# Patient Record
Sex: Male | Born: 1965 | Race: Black or African American | Hispanic: No | Marital: Single | Smoking: Never smoker
Health system: Southern US, Community
[De-identification: ages and names within clinical notes are randomized; demographics above are authoritative.]

## PROBLEM LIST (undated history)

## (undated) DIAGNOSIS — M109 Gout, unspecified: Secondary | ICD-10-CM

## (undated) DIAGNOSIS — I255 Ischemic cardiomyopathy: Secondary | ICD-10-CM

## (undated) DIAGNOSIS — I219 Acute myocardial infarction, unspecified: Secondary | ICD-10-CM

## (undated) DIAGNOSIS — N183 Chronic kidney disease, stage 3 unspecified: Secondary | ICD-10-CM

## (undated) DIAGNOSIS — Z992 Dependence on renal dialysis: Secondary | ICD-10-CM

## (undated) DIAGNOSIS — I96 Gangrene, not elsewhere classified: Secondary | ICD-10-CM

## (undated) DIAGNOSIS — I739 Peripheral vascular disease, unspecified: Secondary | ICD-10-CM

## (undated) DIAGNOSIS — E119 Type 2 diabetes mellitus without complications: Secondary | ICD-10-CM

## (undated) DIAGNOSIS — I5020 Unspecified systolic (congestive) heart failure: Secondary | ICD-10-CM

## (undated) DIAGNOSIS — I509 Heart failure, unspecified: Secondary | ICD-10-CM

## (undated) DIAGNOSIS — G4730 Sleep apnea, unspecified: Secondary | ICD-10-CM

## (undated) DIAGNOSIS — M51379 Other intervertebral disc degeneration, lumbosacral region without mention of lumbar back pain or lower extremity pain: Secondary | ICD-10-CM

## (undated) DIAGNOSIS — I2510 Atherosclerotic heart disease of native coronary artery without angina pectoris: Secondary | ICD-10-CM

## (undated) DIAGNOSIS — Z9710 Presence of artificial limb (complete) (partial), unspecified: Secondary | ICD-10-CM

## (undated) DIAGNOSIS — M5137 Other intervertebral disc degeneration, lumbosacral region: Secondary | ICD-10-CM

## (undated) DIAGNOSIS — I10 Essential (primary) hypertension: Secondary | ICD-10-CM

## (undated) DIAGNOSIS — I639 Cerebral infarction, unspecified: Secondary | ICD-10-CM

## (undated) DIAGNOSIS — D649 Anemia, unspecified: Secondary | ICD-10-CM

## (undated) DIAGNOSIS — I779 Disorder of arteries and arterioles, unspecified: Secondary | ICD-10-CM

## (undated) DIAGNOSIS — Z95810 Presence of automatic (implantable) cardiac defibrillator: Secondary | ICD-10-CM

## (undated) HISTORY — DX: Peripheral vascular disease, unspecified: I73.9

## (undated) HISTORY — DX: Chronic kidney disease, stage 3 (moderate): N18.3

## (undated) HISTORY — DX: Ischemic cardiomyopathy: I25.5

## (undated) HISTORY — DX: Unspecified systolic (congestive) heart failure: I50.20

## (undated) HISTORY — DX: Disorder of arteries and arterioles, unspecified: I77.9

## (undated) HISTORY — PX: CAROTID ENDARTERECTOMY: SUR193

## (undated) HISTORY — DX: Chronic kidney disease, stage 3 unspecified: N18.30

---

## 1992-05-04 HISTORY — PX: HIP SURGERY: SHX245

## 2007-05-05 DIAGNOSIS — I219 Acute myocardial infarction, unspecified: Secondary | ICD-10-CM

## 2007-05-05 DIAGNOSIS — I639 Cerebral infarction, unspecified: Secondary | ICD-10-CM

## 2007-05-05 HISTORY — DX: Acute myocardial infarction, unspecified: I21.9

## 2007-05-05 HISTORY — DX: Cerebral infarction, unspecified: I63.9

## 2008-05-04 HISTORY — PX: OTHER SURGICAL HISTORY: SHX169

## 2008-05-04 HISTORY — PX: CHOLECYSTECTOMY: SHX55

## 2008-05-04 HISTORY — PX: CORONARY ARTERY BYPASS GRAFT: SHX141

## 2008-07-19 ENCOUNTER — Ambulatory Visit: Payer: Self-pay | Admitting: Cardiothoracic Surgery

## 2008-07-26 ENCOUNTER — Ambulatory Visit: Payer: Self-pay | Admitting: Cardiothoracic Surgery

## 2009-05-04 DIAGNOSIS — Z95810 Presence of automatic (implantable) cardiac defibrillator: Secondary | ICD-10-CM

## 2009-05-04 DIAGNOSIS — I472 Ventricular tachycardia, unspecified: Secondary | ICD-10-CM

## 2009-05-04 HISTORY — PX: CARDIAC DEFIBRILLATOR PLACEMENT: SHX171

## 2009-05-04 HISTORY — DX: Presence of automatic (implantable) cardiac defibrillator: Z95.810
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
## 2014-05-09 DIAGNOSIS — M79671 Pain in right foot: Secondary | ICD-10-CM | POA: Diagnosis not present

## 2014-05-09 DIAGNOSIS — L89893 Pressure ulcer of other site, stage 3: Secondary | ICD-10-CM | POA: Diagnosis not present

## 2014-05-09 DIAGNOSIS — M79672 Pain in left foot: Secondary | ICD-10-CM | POA: Diagnosis not present

## 2014-05-09 DIAGNOSIS — B351 Tinea unguium: Secondary | ICD-10-CM | POA: Diagnosis not present

## 2014-05-09 DIAGNOSIS — E11621 Type 2 diabetes mellitus with foot ulcer: Secondary | ICD-10-CM | POA: Diagnosis not present

## 2014-05-12 DIAGNOSIS — Z79899 Other long term (current) drug therapy: Secondary | ICD-10-CM | POA: Diagnosis not present

## 2014-05-12 DIAGNOSIS — M5137 Other intervertebral disc degeneration, lumbosacral region: Secondary | ICD-10-CM | POA: Diagnosis not present

## 2014-05-12 DIAGNOSIS — M545 Low back pain: Secondary | ICD-10-CM | POA: Diagnosis not present

## 2014-05-12 DIAGNOSIS — M5136 Other intervertebral disc degeneration, lumbar region: Secondary | ICD-10-CM | POA: Diagnosis not present

## 2014-05-12 DIAGNOSIS — M47817 Spondylosis without myelopathy or radiculopathy, lumbosacral region: Secondary | ICD-10-CM | POA: Diagnosis not present

## 2014-05-12 DIAGNOSIS — Z794 Long term (current) use of insulin: Secondary | ICD-10-CM | POA: Diagnosis not present

## 2014-05-12 DIAGNOSIS — Z886 Allergy status to analgesic agent status: Secondary | ICD-10-CM | POA: Diagnosis not present

## 2014-05-12 DIAGNOSIS — E119 Type 2 diabetes mellitus without complications: Secondary | ICD-10-CM | POA: Diagnosis not present

## 2014-05-16 DIAGNOSIS — L89893 Pressure ulcer of other site, stage 3: Secondary | ICD-10-CM | POA: Diagnosis not present

## 2014-05-16 DIAGNOSIS — E11621 Type 2 diabetes mellitus with foot ulcer: Secondary | ICD-10-CM | POA: Diagnosis not present

## 2014-05-16 DIAGNOSIS — Z09 Encounter for follow-up examination after completed treatment for conditions other than malignant neoplasm: Secondary | ICD-10-CM | POA: Diagnosis not present

## 2014-05-18 DIAGNOSIS — M5416 Radiculopathy, lumbar region: Secondary | ICD-10-CM | POA: Diagnosis not present

## 2014-05-30 DIAGNOSIS — E11621 Type 2 diabetes mellitus with foot ulcer: Secondary | ICD-10-CM | POA: Diagnosis not present

## 2014-05-30 DIAGNOSIS — E119 Type 2 diabetes mellitus without complications: Secondary | ICD-10-CM | POA: Diagnosis not present

## 2014-05-30 DIAGNOSIS — L89893 Pressure ulcer of other site, stage 3: Secondary | ICD-10-CM | POA: Diagnosis not present

## 2014-05-31 ENCOUNTER — Other Ambulatory Visit (HOSPITAL_COMMUNITY): Payer: Self-pay | Admitting: Orthopedic Surgery

## 2014-05-31 DIAGNOSIS — M1651 Unilateral post-traumatic osteoarthritis, right hip: Secondary | ICD-10-CM | POA: Diagnosis not present

## 2014-05-31 DIAGNOSIS — M5440 Lumbago with sciatica, unspecified side: Secondary | ICD-10-CM

## 2014-05-31 DIAGNOSIS — M6149 Other calcification of muscle, multiple sites: Secondary | ICD-10-CM | POA: Diagnosis not present

## 2014-06-04 ENCOUNTER — Other Ambulatory Visit: Payer: Self-pay | Admitting: Orthopedic Surgery

## 2014-06-04 DIAGNOSIS — M25551 Pain in right hip: Secondary | ICD-10-CM

## 2014-06-07 ENCOUNTER — Ambulatory Visit
Admission: RE | Admit: 2014-06-07 | Discharge: 2014-06-07 | Disposition: A | Discharge status: Home / Self Care | Payer: Medicare Other | Source: Ambulatory Visit | Attending: Orthopedic Surgery | Admitting: Orthopedic Surgery

## 2014-06-07 DIAGNOSIS — R937 Abnormal findings on diagnostic imaging of other parts of musculoskeletal system: Secondary | ICD-10-CM | POA: Diagnosis not present

## 2014-06-07 DIAGNOSIS — M25551 Pain in right hip: Secondary | ICD-10-CM | POA: Diagnosis not present

## 2014-06-07 DIAGNOSIS — M25851 Other specified joint disorders, right hip: Secondary | ICD-10-CM | POA: Diagnosis not present

## 2014-06-13 DIAGNOSIS — E11621 Type 2 diabetes mellitus with foot ulcer: Secondary | ICD-10-CM | POA: Diagnosis not present

## 2014-06-13 DIAGNOSIS — L89893 Pressure ulcer of other site, stage 3: Secondary | ICD-10-CM | POA: Diagnosis not present

## 2014-06-14 DIAGNOSIS — M1651 Unilateral post-traumatic osteoarthritis, right hip: Secondary | ICD-10-CM | POA: Diagnosis not present

## 2014-06-14 DIAGNOSIS — M6149 Other calcification of muscle, multiple sites: Secondary | ICD-10-CM | POA: Diagnosis not present

## 2014-06-15 ENCOUNTER — Other Ambulatory Visit: Payer: Self-pay | Admitting: Orthopedic Surgery

## 2014-06-21 DIAGNOSIS — M25551 Pain in right hip: Secondary | ICD-10-CM | POA: Diagnosis not present

## 2014-06-21 DIAGNOSIS — M25651 Stiffness of right hip, not elsewhere classified: Secondary | ICD-10-CM | POA: Diagnosis not present

## 2014-06-21 DIAGNOSIS — M6281 Muscle weakness (generalized): Secondary | ICD-10-CM | POA: Diagnosis not present

## 2014-07-02 ENCOUNTER — Encounter: Payer: Self-pay | Admitting: Radiation Oncology

## 2014-07-02 NOTE — Progress Notes (Signed)
History: Calcification, Right Hip S/P Acetabular Fracture  Location(s) of Symptomatic pain: Right Hip   06/07/14 CT of Right Hip: "Shows extensive abnormal soft tissue calcification around the right hip which should severly restrict most motion of the hip with the exception of the internal rotation. Complete healing of the acetabular fracture.  The femoral head appears normal."   Anticipated Treatment: Surgery to remove the Heterotopic Bone  Patient's main complaints related to symptomatic tumor(s) are: Pain in the right hip. Denies any numbness or tingling down his leg or any groin pain.  No loss of bowel or bladder functions  Pain on a scale of 0-10 is:     Ambulatory status? Walker? Wheelchair?: Ambulatory  SAFETY ISSUES:  Prior radiation? No  Pacemaker/ICD? No  Possible current pregnancy? N/A  Is the patient on methotrexate? No  Additional Complaints / other details:Surgery=Excision Heterotopic Bone   Right hip scheduled 07/18/14 with Dr. Pilar Plate Rowan,MD

## 2014-07-03 ENCOUNTER — Ambulatory Visit: Payer: Medicare Other

## 2014-07-03 ENCOUNTER — Telehealth: Payer: Self-pay | Admitting: *Deleted

## 2014-07-03 ENCOUNTER — Encounter: Payer: Self-pay | Admitting: Radiation Oncology

## 2014-07-03 ENCOUNTER — Ambulatory Visit
Admission: RE | Admit: 2014-07-03 | Discharge: 2014-07-03 | Disposition: A | Discharge status: Home / Self Care | Payer: Medicare Other | Source: Ambulatory Visit | Attending: Radiation Oncology | Admitting: Radiation Oncology

## 2014-07-03 DIAGNOSIS — Z95810 Presence of automatic (implantable) cardiac defibrillator: Secondary | ICD-10-CM | POA: Insufficient documentation

## 2014-07-03 DIAGNOSIS — S32491S Other specified fracture of right acetabulum, sequela: Secondary | ICD-10-CM | POA: Insufficient documentation

## 2014-07-03 DIAGNOSIS — Z9049 Acquired absence of other specified parts of digestive tract: Secondary | ICD-10-CM | POA: Insufficient documentation

## 2014-07-03 DIAGNOSIS — E119 Type 2 diabetes mellitus without complications: Secondary | ICD-10-CM | POA: Insufficient documentation

## 2014-07-03 DIAGNOSIS — M898X9 Other specified disorders of bone, unspecified site: Secondary | ICD-10-CM

## 2014-07-03 DIAGNOSIS — M948X9 Other specified disorders of cartilage, unspecified sites: Secondary | ICD-10-CM | POA: Insufficient documentation

## 2014-07-03 DIAGNOSIS — Z794 Long term (current) use of insulin: Secondary | ICD-10-CM | POA: Insufficient documentation

## 2014-07-03 DIAGNOSIS — I509 Heart failure, unspecified: Secondary | ICD-10-CM | POA: Insufficient documentation

## 2014-07-03 DIAGNOSIS — I10 Essential (primary) hypertension: Secondary | ICD-10-CM | POA: Insufficient documentation

## 2014-07-03 HISTORY — DX: Essential (primary) hypertension: I10

## 2014-07-03 HISTORY — DX: Heart failure, unspecified: I50.9

## 2014-07-03 HISTORY — DX: Other intervertebral disc degeneration, lumbosacral region without mention of lumbar back pain or lower extremity pain: M51.379

## 2014-07-03 HISTORY — DX: Other intervertebral disc degeneration, lumbosacral region: M51.37

## 2014-07-03 HISTORY — DX: Type 2 diabetes mellitus without complications: E11.9

## 2014-07-03 NOTE — Telephone Encounter (Signed)
Called [facility] Glad Dilling[facility], per Dr. Orvilla Cornwall. Claude needs to be sen next week prior to his surgery on 07/18/14,please call and schedule this now,Karen gave verbal understanding 2:33 PM

## 2014-07-03 NOTE — Telephone Encounter (Signed)
Called patient home,spoke with [name], asked if he was going to make his appt today with Dr.Murray?, 'No, I have so many Dr appts, I forgot this one",asked if he wanted me to transfer him to our scheduler to reschedule with Dr.Murray?, 'Yes, thank you, I'm sorry", transferred do Rhonda Flynt 1:55 PM

## 2014-07-04 DIAGNOSIS — I10 Essential (primary) hypertension: Secondary | ICD-10-CM | POA: Diagnosis not present

## 2014-07-04 DIAGNOSIS — I5020 Unspecified systolic (congestive) heart failure: Secondary | ICD-10-CM | POA: Diagnosis not present

## 2014-07-04 DIAGNOSIS — Z4789 Encounter for other orthopedic aftercare: Secondary | ICD-10-CM | POA: Diagnosis not present

## 2014-07-04 DIAGNOSIS — Q6589 Other specified congenital deformities of hip: Secondary | ICD-10-CM | POA: Diagnosis not present

## 2014-07-05 NOTE — Pre-Procedure Instructions (Signed)
[name].  07/05/2014   Your procedure is scheduled on:  Wed, Mar 16 @ 12:45 PM  Report to [name] Entrance A  at 10:45 AM.  Call this number if you have problems the morning of surgery: [phone]   Remember:   Do not eat food or drink liquids after midnight.   Take these medicines the morning of surgery with A SIP OF WATER: Carvedilol(Coreg)              No Goody's,BC's,Aspirin,Ibuprofen,Aleve,Fish Oil,or any Herbal Medications.   Do not wear jewelry.  Do not wear lotions, powders, or colognes. You may wear deodorant.  Men may shave face and neck.  Do not bring valuables to the [name].  [name] is not responsible                  for any belongings or valuables.               Contacts, dentures or bridgework may not be worn into surgery.  Leave suitcase in the car. After surgery it may be brought to your room.  For patients admitted to the [name], discharge time is determined by your                treatment team.                   Special Instructions:  [name] - Preparing for Surgery  Before surgery, you can play an important role.  Because skin is not sterile, your skin needs to be as free of germs as possible.  You can reduce the number of germs on you skin by washing with CHG (chlorahexidine gluconate) soap before surgery.  CHG is an antiseptic cleaner which kills germs and bonds with the skin to continue killing germs even after washing.  Please DO NOT use if you have an allergy to CHG or antibacterial soaps.  If your skin becomes reddened/irritated stop using the CHG and inform your nurse when you arrive at Short Stay.  Do not shave (including legs and underarms) for at least 48 hours prior to the first CHG shower.  You may shave your face.  Please follow these instructions carefully:   1.  Shower with CHG Soap the night before surgery and the                                morning of Surgery.  2.  If you choose to wash your hair, wash your hair  first as usual with your       normal shampoo.  3.  After you shampoo, rinse your hair and body thoroughly to remove the                      Shampoo.  4.  Use CHG as you would any other liquid soap.  You can apply chg directly       to the skin and wash gently with scrungie or a clean washcloth.  5.  Apply the CHG Soap to your body ONLY FROM THE NECK DOWN.        Do not use on open wounds or open sores.  Avoid contact with your eyes,       ears, mouth and genitals (private parts).  Wash genitals (private parts)       with your normal soap.  6.  Wash thoroughly, paying  special attention to the area where your surgery        will be performed.  7.  Thoroughly rinse your body with warm water from the neck down.  8.  DO NOT shower/wash with your normal soap after using and rinsing off       the CHG Soap.  9.  Pat yourself dry with a clean towel.            10.  Wear clean pajamas.            11.  Place clean sheets on your bed the night of your first shower and do not        sleep with pets.  Day of Surgery  Do not apply any lotions/deoderants the morning of surgery.  Please wear clean clothes to the [name]/surgery center.     Please read over the following fact sheets that you were given: Pain Booklet, Coughing and Deep Breathing, Blood Transfusion Information, MRSA Information and Surgical Site Infection Prevention

## 2014-07-06 ENCOUNTER — Encounter (HOSPITAL_COMMUNITY): Payer: Self-pay

## 2014-07-06 ENCOUNTER — Inpatient Hospital Stay (HOSPITAL_COMMUNITY)
Admission: RE | Admit: 2014-07-06 | Discharge: 2014-07-06 | Disposition: A | Discharge status: Home / Self Care | Payer: Medicare Other | Source: Ambulatory Visit

## 2014-07-10 ENCOUNTER — Telehealth: Payer: Self-pay | Admitting: *Deleted

## 2014-07-10 ENCOUNTER — Ambulatory Visit: Admission: RE | Admit: 2014-07-10 | Payer: Medicare Other | Source: Ambulatory Visit | Admitting: Radiation Oncology

## 2014-07-10 ENCOUNTER — Ambulatory Visit: Admission: RE | Admit: 2014-07-10 | Payer: Medicare Other | Source: Ambulatory Visit

## 2014-07-10 NOTE — Telephone Encounter (Signed)
Called patient at home, woke him up, patient stated he didn't know of this appt today, and he has 3 Dr. appt tomorrow, asked if he could come in by 430pm today,"No", informed him that MD has to see him  beofre his syrgery, he stated he coukld come in either tomorrow or Thursday his days off, transferred call to Santiago Glad Dillingham to schedule appt , appt made Thursday at [facility] per karen Dillingham, will inform MD 3:17 PM

## 2014-07-11 DIAGNOSIS — L89893 Pressure ulcer of other site, stage 3: Secondary | ICD-10-CM | POA: Diagnosis not present

## 2014-07-11 DIAGNOSIS — E11621 Type 2 diabetes mellitus with foot ulcer: Secondary | ICD-10-CM | POA: Diagnosis not present

## 2014-07-12 ENCOUNTER — Ambulatory Visit
Admission: RE | Admit: 2014-07-12 | Discharge: 2014-07-12 | Disposition: A | Discharge status: Home / Self Care | Payer: Medicare Other | Source: Ambulatory Visit | Attending: Radiation Oncology | Admitting: Radiation Oncology

## 2014-07-12 ENCOUNTER — Encounter: Payer: Self-pay | Admitting: Radiation Oncology

## 2014-07-12 ENCOUNTER — Other Ambulatory Visit: Payer: Self-pay | Admitting: Radiation Oncology

## 2014-07-12 ENCOUNTER — Encounter (HOSPITAL_COMMUNITY)
Admission: RE | Admit: 2014-07-12 | Discharge: 2014-07-12 | Disposition: A | Discharge status: Home / Self Care | Payer: Medicare Other | Source: Ambulatory Visit | Attending: Orthopedic Surgery | Admitting: Orthopedic Surgery

## 2014-07-12 VITALS — BP 146/86 | HR 101 | Temp 98.0°F | Ht 70.0 in | Wt 268.9 lb

## 2014-07-12 DIAGNOSIS — E119 Type 2 diabetes mellitus without complications: Secondary | ICD-10-CM | POA: Diagnosis not present

## 2014-07-12 DIAGNOSIS — M948X9 Other specified disorders of cartilage, unspecified sites: Secondary | ICD-10-CM | POA: Diagnosis not present

## 2014-07-12 DIAGNOSIS — Z95810 Presence of automatic (implantable) cardiac defibrillator: Secondary | ICD-10-CM | POA: Diagnosis not present

## 2014-07-12 DIAGNOSIS — I509 Heart failure, unspecified: Secondary | ICD-10-CM | POA: Diagnosis not present

## 2014-07-12 DIAGNOSIS — I10 Essential (primary) hypertension: Secondary | ICD-10-CM | POA: Diagnosis not present

## 2014-07-12 DIAGNOSIS — M898X9 Other specified disorders of bone, unspecified site: Secondary | ICD-10-CM

## 2014-07-12 DIAGNOSIS — Z794 Long term (current) use of insulin: Secondary | ICD-10-CM | POA: Diagnosis not present

## 2014-07-12 DIAGNOSIS — S32491S Other specified fracture of right acetabulum, sequela: Secondary | ICD-10-CM | POA: Diagnosis not present

## 2014-07-12 DIAGNOSIS — Z9049 Acquired absence of other specified parts of digestive tract: Secondary | ICD-10-CM | POA: Diagnosis not present

## 2014-07-12 HISTORY — DX: Presence of automatic (implantable) cardiac defibrillator: Z95.810

## 2014-07-12 NOTE — Progress Notes (Signed)
Please see consultation note under CT simulation encounter from earlier today.
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
## 2014-07-12 NOTE — Addendum Note (Signed)
Encounter addended by: Benn Moulder, RN on: 07/12/2014  5:59 PM<BR>     Documentation filed: Charges VN

## 2014-07-12 NOTE — Progress Notes (Addendum)
Mr. Sylte here for assessment for Heterotopic bone formation in his right hip.  He denies any pain today, but reports that when he ambulates pain is worsens.  He has a defibrillator inserted by [facility], Dr. Eual Fines.

## 2014-07-13 ENCOUNTER — Ambulatory Visit
Admission: RE | Admit: 2014-07-13 | Discharge: 2014-07-13 | Disposition: A | Discharge status: Home / Self Care | Payer: Medicare Other | Source: Ambulatory Visit | Attending: Radiation Oncology | Admitting: Radiation Oncology

## 2014-07-13 DIAGNOSIS — I509 Heart failure, unspecified: Secondary | ICD-10-CM | POA: Diagnosis not present

## 2014-07-13 DIAGNOSIS — M898X9 Other specified disorders of bone, unspecified site: Secondary | ICD-10-CM

## 2014-07-13 DIAGNOSIS — M948X9 Other specified disorders of cartilage, unspecified sites: Secondary | ICD-10-CM | POA: Diagnosis not present

## 2014-07-13 DIAGNOSIS — I10 Essential (primary) hypertension: Secondary | ICD-10-CM | POA: Diagnosis not present

## 2014-07-13 DIAGNOSIS — E119 Type 2 diabetes mellitus without complications: Secondary | ICD-10-CM | POA: Diagnosis not present

## 2014-07-13 DIAGNOSIS — S32491S Other specified fracture of right acetabulum, sequela: Secondary | ICD-10-CM | POA: Diagnosis not present

## 2014-07-13 DIAGNOSIS — Z794 Long term (current) use of insulin: Secondary | ICD-10-CM | POA: Diagnosis not present

## 2014-07-14 NOTE — Progress Notes (Signed)
Complex simulation/treatment planning note: The patient was taken to the simulator on 07/13/2014.  A Vac lock immobilization device was constructed.  His right pelvis/hip was scanned.  The CT data set was sent to the planning system where I contoured his right hip calcifications.  He was set up RAO and LPO with 2 separate and unique multileaf collimators (total of 3 complex treatment devices).  I am prescribing 7000 cGy in one session.

## 2014-07-16 ENCOUNTER — Encounter (HOSPITAL_COMMUNITY): Payer: Self-pay

## 2014-07-16 ENCOUNTER — Encounter (HOSPITAL_COMMUNITY)
Admission: RE | Admit: 2014-07-16 | Discharge: 2014-07-16 | Disposition: A | Discharge status: Home / Self Care | Payer: Medicare Other | Source: Ambulatory Visit | Attending: Orthopedic Surgery | Admitting: Orthopedic Surgery

## 2014-07-16 ENCOUNTER — Ambulatory Visit (HOSPITAL_COMMUNITY)
Admission: RE | Admit: 2014-07-16 | Discharge: 2014-07-16 | Disposition: A | Discharge status: Home / Self Care | Payer: Medicare Other | Source: Ambulatory Visit | Attending: Orthopedic Surgery | Admitting: Orthopedic Surgery

## 2014-07-16 DIAGNOSIS — I10 Essential (primary) hypertension: Secondary | ICD-10-CM

## 2014-07-16 DIAGNOSIS — Z01818 Encounter for other preprocedural examination: Secondary | ICD-10-CM | POA: Insufficient documentation

## 2014-07-16 DIAGNOSIS — Z794 Long term (current) use of insulin: Secondary | ICD-10-CM | POA: Insufficient documentation

## 2014-07-16 DIAGNOSIS — Z01810 Encounter for preprocedural cardiovascular examination: Secondary | ICD-10-CM

## 2014-07-16 DIAGNOSIS — M948X9 Other specified disorders of cartilage, unspecified sites: Secondary | ICD-10-CM | POA: Insufficient documentation

## 2014-07-16 DIAGNOSIS — Z01812 Encounter for preprocedural laboratory examination: Secondary | ICD-10-CM | POA: Insufficient documentation

## 2014-07-16 DIAGNOSIS — D62 Acute posthemorrhagic anemia: Secondary | ICD-10-CM | POA: Diagnosis not present

## 2014-07-16 DIAGNOSIS — E119 Type 2 diabetes mellitus without complications: Secondary | ICD-10-CM

## 2014-07-16 DIAGNOSIS — I509 Heart failure, unspecified: Secondary | ICD-10-CM

## 2014-07-16 DIAGNOSIS — Z95810 Presence of automatic (implantable) cardiac defibrillator: Secondary | ICD-10-CM

## 2014-07-16 DIAGNOSIS — M5137 Other intervertebral disc degeneration, lumbosacral region: Secondary | ICD-10-CM | POA: Diagnosis not present

## 2014-07-16 DIAGNOSIS — M25851 Other specified joint disorders, right hip: Secondary | ICD-10-CM

## 2014-07-16 DIAGNOSIS — M898X8 Other specified disorders of bone, other site: Secondary | ICD-10-CM | POA: Diagnosis not present

## 2014-07-16 HISTORY — DX: Cerebral infarction, unspecified: I63.9

## 2014-07-16 HISTORY — DX: Atherosclerotic heart disease of native coronary artery without angina pectoris: I25.10

## 2014-07-16 HISTORY — DX: Sleep apnea, unspecified: G47.30

## 2014-07-16 LAB — CBC WITH DIFFERENTIAL/PLATELET
Basophils Absolute: 0 10*3/uL (ref 0.0–0.1)
Basophils Relative: 0 % (ref 0–1)
Eosinophils Absolute: 0.1 10*3/uL (ref 0.0–0.7)
Eosinophils Relative: 1 % (ref 0–5)
HEMATOCRIT: 36.5 % — AB (ref 39.0–52.0)
HEMOGLOBIN: 11.8 g/dL — AB (ref 13.0–17.0)
LYMPHS PCT: 20 % (ref 12–46)
Lymphs Abs: 1.8 10*3/uL (ref 0.7–4.0)
MCH: 28.2 pg (ref 26.0–34.0)
MCHC: 32.3 g/dL (ref 30.0–36.0)
MCV: 87.3 fL (ref 78.0–100.0)
Monocytes Absolute: 0.7 10*3/uL (ref 0.1–1.0)
Monocytes Relative: 8 % (ref 3–12)
NEUTROS ABS: 6.5 10*3/uL (ref 1.7–7.7)
NEUTROS PCT: 71 % (ref 43–77)
Platelets: 248 10*3/uL (ref 150–400)
RBC: 4.18 MIL/uL — ABNORMAL LOW (ref 4.22–5.81)
RDW: 14.7 % (ref 11.5–15.5)
WBC: 9.1 10*3/uL (ref 4.0–10.5)

## 2014-07-16 LAB — URINALYSIS, ROUTINE W REFLEX MICROSCOPIC
Bilirubin Urine: NEGATIVE
GLUCOSE, UA: NEGATIVE mg/dL
HGB URINE DIPSTICK: NEGATIVE
Ketones, ur: NEGATIVE mg/dL
Leukocytes, UA: NEGATIVE
Nitrite: NEGATIVE
PROTEIN: NEGATIVE mg/dL
SPECIFIC GRAVITY, URINE: 1.013 (ref 1.005–1.030)
Urobilinogen, UA: 1 mg/dL (ref 0.0–1.0)
pH: 6.5 (ref 5.0–8.0)

## 2014-07-16 LAB — BASIC METABOLIC PANEL
ANION GAP: 11 (ref 5–15)
BUN: 36 mg/dL — AB (ref 6–23)
CALCIUM: 8.9 mg/dL (ref 8.4–10.5)
CO2: 24 mmol/L (ref 19–32)
CREATININE: 1.64 mg/dL — AB (ref 0.50–1.35)
Chloride: 97 mmol/L (ref 96–112)
GFR calc Af Amer: 55 mL/min — ABNORMAL LOW (ref >90)
GFR calc non Af Amer: 48 mL/min — ABNORMAL LOW (ref >90)
GLUCOSE: 251 mg/dL — AB (ref 70–99)
Potassium: 3.8 mmol/L (ref 3.5–5.1)
Sodium: 132 mmol/L — ABNORMAL LOW (ref 135–145)

## 2014-07-16 LAB — PROTIME-INR
INR: 1.18 (ref 0.00–1.49)
PROTHROMBIN TIME: 15.1 s (ref 11.6–15.2)

## 2014-07-16 LAB — TYPE AND SCREEN
ABO/RH(D): AB POS
Antibody Screen: NEGATIVE

## 2014-07-16 LAB — ABO/RH: ABO/RH(D): AB POS

## 2014-07-16 LAB — APTT: aPTT: 32 seconds (ref 24–37)

## 2014-07-16 NOTE — Progress Notes (Signed)
Patient of [facility] [place] dr Eual Fines [phone]. req'd ekg, cardiac tests, office notes from 2 weeks ago.

## 2014-07-16 NOTE — Pre-Procedure Instructions (Addendum)
[name].  07/16/2014   Your procedure is scheduled on:  Wed, Mar 16 @ 12:45 PM  Report to [facility]  at 10:45 AM.  Call this number if you have problems the morning of surgery: [phone]   Remember:   Do not eat food or drink liquids after midnight.   Take these medicines the morning of surgery with A SIP OF WATER: Carvedilol(Coreg)              No Goody's,BC's,Aspirin,Ibuprofen,Aleve,Fish Oil,or any Herbal Medications.    No insulin am of surgery   Do not wear jewelry.  Do not wear lotions, powders, or colognes. You may wear deodorant.  Men may shave face and neck.  Do not bring valuables to the hospital.  [facility] is not responsible                  for any belongings or valuables.               Contacts, dentures or bridgework may not be worn into surgery.  Leave suitcase in the car. After surgery it may be brought to your room.  For patients admitted to the hospital, discharge time is determined by your                treatment team.                   Special Instructions:  [facility] - Preparing for Surgery  Before surgery, you can play an important role.  Because skin is not sterile, your skin needs to be as free of germs as possible.  You can reduce the number of germs on you skin by washing with CHG (chlorahexidine gluconate) soap before surgery.  CHG is an antiseptic cleaner which kills germs and bonds with the skin to continue killing germs even after washing.  Please DO NOT use if you have an allergy to CHG or antibacterial soaps.  If your skin becomes reddened/irritated stop using the CHG and inform your nurse when you arrive at Short Stay.  Do not shave (including legs and underarms) for at least 48 hours prior to the first CHG shower.  You may shave your face.  Please follow these instructions carefully:   1.  Shower with CHG Soap the night before surgery and the                                morning of Surgery.  2.  If you choose to  wash your hair, wash your hair first as usual with your       normal shampoo.  3.  After you shampoo, rinse your hair and body thoroughly to remove the                      Shampoo.  4.  Use CHG as you would any other liquid soap.  You can apply chg directly       to the skin and wash gently with scrungie or a clean washcloth.  5.  Apply the CHG Soap to your body ONLY FROM THE NECK DOWN.        Do not use on open wounds or open sores.  Avoid contact with your eyes,       ears, mouth and genitals (private parts).  Wash genitals (private parts)       with your  normal soap.  6.  Wash thoroughly, paying special attention to the area where your surgery        will be performed.  7.  Thoroughly rinse your body with warm water from the neck down.  8.  DO NOT shower/wash with your normal soap after using and rinsing off       the CHG Soap.  9.  Pat yourself dry with a clean towel.            10.  Wear clean pajamas.            11.  Place clean sheets on your bed the night of your first shower and do not        sleep with pets.  Day of Surgery  Do not apply any lotions/deoderants the morning of surgery.  Please wear clean clothes to the hospital/surgery center.     Please read over the following fact sheets that you were given: Pain Booklet, Coughing and Deep Breathing, Blood Transfusion Information, MRSA Information and Surgical Site Infection Prevention

## 2014-07-17 ENCOUNTER — Encounter (HOSPITAL_COMMUNITY): Payer: Self-pay

## 2014-07-17 MED ORDER — CEFAZOLIN SODIUM 10 G IJ SOLR
3.0000 g | INTRAMUSCULAR | Status: AC
  Administered 2014-07-18: 3 g via INTRAVENOUS
  Filled 2014-07-17: qty 3000

## 2014-07-17 MED ORDER — DEXTROSE-NACL 5-0.45 % IV SOLN: INTRAVENOUS | Status: DC

## 2014-07-17 NOTE — H&P (Signed)
[name]. is an 49 y.o. male.    Chief Complaint: Right Hip Pain  HPI:  Mr. Vangenderen is a 49 year old male who presents today for follow-up of right hip pain and to discuss CT results.  The patient reports his pain has been unchanged since his last visit at the end of January.  The patient denies any numbness or tingling down the right leg as well as any groin pain or loss of bowel or bladder function.  He reports that tramadol, Mobic, and Zanaflex help with the pain.  However, they make him sleepy.  He reports he only takes them when he knows he is going to be active.  Past Medical History  Diagnosis Date  . DDD (degenerative disc disease), lumbosacral     L5-S1  . Diabetes     Lantus at bedtime  . CHF (congestive heart failure)     takes Torsemide daily  . CHF (congestive heart failure)   . Hypertension     takes Coreg daily  . Cardiac defibrillator in place   . AICD (automatic cardioverter/defibrillator) present   . Stroke 09    no weakness  . Sleep apnea     sleep study yr ago-unable to afford cpap  . Coronary artery disease     Past Surgical History  Procedure Laterality Date  . Hip surgery Right 1994  . Open heart surgery  2010    x 3  . Cholecystectomy  2010  . Cardiac defibrillator placement  2011  . Coronary artery bypass graft  10    Family History  Problem Relation Age of Onset  . Hypertension Other   . Diabetes Other    Social History:  reports that he has never smoked. He has never used smokeless tobacco. He reports that he does not drink alcohol or use illicit drugs.  Allergies: No Known Allergies  No prescriptions prior to admission    Results for orders placed or performed during the hospital encounter of 07/16/14 (from the past 48 hour(s))  ABO/Rh     Status: None   Collection Time: 07/16/14  3:58 PM  Result Value Ref Range   ABO/RH(D) AB POS    Dg Chest 2 View  07/16/2014   CLINICAL DATA:  Preop right hip surgery  EXAM: CHEST  2 VIEW   COMPARISON:  03/11/2014  FINDINGS: Lungs are essentially clear. No focal consolidation. No pleural effusion or pneumothorax.  Mild cardiomegaly. Postsurgical changes related to prior CABG. Left subclavian ICD.  Visualized osseous structures are within normal limits.  Cholecystectomy clips.  IMPRESSION: No evidence of acute cardiopulmonary disease.   Electronically Signed   By: Julian Hy M.D.   On: 07/16/2014 16:28    Review of Systems  Constitutional: Positive for malaise/fatigue and diaphoresis.  HENT: Negative.   Eyes: Positive for blurred vision.  Respiratory: Positive for shortness of breath.   Cardiovascular: Positive for leg swelling.  Gastrointestinal: Positive for diarrhea.  Musculoskeletal: Positive for joint pain.  Skin: Negative.   Neurological: Positive for dizziness.  Endo/Heme/Allergies: Positive for polydipsia. Bruises/bleeds easily.  Psychiatric/Behavioral: Negative.     There were no vitals taken for this visit. Physical Exam  Constitutional: He is oriented to person, place, and time. He appears well-developed and well-nourished.  HENT:  Head: Normocephalic and atraumatic.  Eyes: Pupils are equal, round, and reactive to light.  Neck: Normal range of motion. Neck supple.  Cardiovascular: Intact distal pulses.   Respiratory: Effort normal.  Musculoskeletal:  The  right hip internally rotates to 10 with pain external rotation is 25 with pain he asked he flex 215 and the leg does comp to within 10 of full extension at the hip.  His skin is intact.  Surgical scars are well-healed.  Neurological: He is alert and oriented to person, place, and time.  Skin: Skin is warm and dry.  Psychiatric: He has a normal mood and affect. His behavior is normal. Judgment and thought content normal.     Assessment/Plan Assess: Right hip pain secondary to heterotopic bone  Plan:  The hip joint looks good and does not require replacement.  The CT shows heterotopic bone growth  in the right posterior hip.  Treatment options were discussed with the patient.  He expressed a desire to move forward with surgery to remove the heterotopic bone.  The surgery will be followed up with radiation therapy to help prevent bone regrowth.  The risk and benefits of the surgery were discussed with the patient including a 10 to 15% chance of bone regrowth after treatment.  The patient was informed to call our surgery scheduler at least 2-3 weeks before he would like surgery.  He is given her card at today's appointment.  We'll also set up an appointment for him to meet with radiation oncology prior to the surgery so they can determine the best course of radiation treatment.  He may call with any questions or concerns.  PHILLIPS, ERIC R 07/17/2014, 7:55 PM

## 2014-07-17 NOTE — Progress Notes (Addendum)
Anesthesia chart review:  Patient is a 49 year old male scheduled for excision of heterotopic bone, right hip on 07/18/14 by Dr. Mayer Camel.  History includes non-smoker,CAD/MI s/p CABG X 3 '10, ischemic CM, CHF, s/p AICD, OSA without CPAP use, CVA '09, HTN, DM on insulin, cholecystectomy '08. PCP is Dr. Wallene Dales. Cardiologist is Dr. Minna Merritts.  EKG 07/15/13: NSR, right BBB, LAFB, Bifascicular block, septal infarct (age undetermined), T wave abnormality, consider lateral ischemia. EKG appears stable when compared to tracing from [facility] on 03/11/14.  03/12/14 Echo: Mild concentric LVH, moderate global LV hypokinesis. EF 25-30%. Mild to moderate TR. RVSP 34 mmHg. Mild MR.  Last cath on 06/19/08 ([facility]) was pre-CABG and showed 3V CAD, LM also narrow. Severe segmental LV dysfunction due to LAD occlusion.   CXR 07/16/14: No evidence of acute cardiopulmonary disease.  Preoperative labs noted.  Cr 1.64, BUN 36. Glucose 251.  H/H 11.8/36.5.  Cardiology records are still pending--re-requested.  AICD records pending. PCP records with labs pending.  Paul [name] [facility] Short Stay Center/Anesthesiology Phone [phone] 07/17/2014 11:11 AM  Addendum: Called again for records around 4PM and received around 4:30 PM.  Last office note is from 07/04/14 with Paul Cowman, FNP-C.  He reported SOB, PND, and weight gain of 20 lbs despite medication compliance.  Toresemide was temporarily increased and metolazone twice weekly PRN weight gain was added.  3-4 month follow-up recommended with BMET in 2 weeks.  AICD is [employer]. ICD perioperative form still not received, so I will refax it.   Unfortunately, records received late (PAT was just yesterday PM) this afternoon, and recent visit indicates that he was having what sounds like acute issues.  It is unclear if these have resolved.  Discussed with anesthesiologist Dr. Conrad Geary who recommends getting cardiology to weight in on plans for surgery--if patient's  symptoms have improved or stable would he be acceptable risk for surgery? I called and spoke with Dr. Mayer Camel regarding this.  He understands that I will likely not get resolution of this issue before the end of the business day, but is okay with keeping case as scheduled for now in hopes that we will be able to get an answer from cardiology by tomorrow afternoon.  I did call Dr. Talbot Grumbling office minutes before 5PM and was placed on hold for several minutes before the line was disconnected and then switched over to the answering service.  I have updated Juliann Pulse at Dr. Damita Dunnings office.  She will contact Dr. Talbot Grumbling office first thing tomorrow morning and follow-up with me.  Paul [name] [facility] Short Stay Center/Anesthesiology Phone [phone] 07/17/2014 6:34 PM   Addendum: 10/06/11 Nuclear stress test: Large fixed anteroseptal defect consistent with previous scar. There is a moderate decrease in LVEF with an EF of 33%. There is prominence of RV noted on rest and stress images.  Images were compared to 05/13/09 and little or no change.   Sandi Raveling contacted Dr. Talbot Grumbling office this morning, and he did sign a letter of clearance stating patient is low cardiac risk for the planned procedure.  He recommended avoiding increased fluids due to risk of CHF with EF 25-30%.  Further evaluation by his assigned anesthesiologist on arrival today.  Paul [name] Short Stay Center/Anesthesiology Phone [phone] 07/18/2014 10:51 AM

## 2014-07-18 ENCOUNTER — Inpatient Hospital Stay (HOSPITAL_COMMUNITY): Payer: Medicare Other | Admitting: Vascular Surgery

## 2014-07-18 ENCOUNTER — Inpatient Hospital Stay (HOSPITAL_COMMUNITY): Payer: Medicare Other

## 2014-07-18 ENCOUNTER — Encounter: Payer: Self-pay | Admitting: Radiation Oncology

## 2014-07-18 ENCOUNTER — Inpatient Hospital Stay (HOSPITAL_COMMUNITY)
Admission: RE | Admit: 2014-07-18 | Discharge: 2014-07-20 | DRG: 498 | Disposition: A | Discharge status: Home Health | Payer: Medicare Other | Source: Ambulatory Visit | Attending: Orthopedic Surgery | Admitting: Orthopedic Surgery

## 2014-07-18 ENCOUNTER — Telehealth: Payer: Self-pay | Admitting: *Deleted

## 2014-07-18 ENCOUNTER — Encounter (HOSPITAL_COMMUNITY): Payer: Self-pay | Admitting: *Deleted

## 2014-07-18 ENCOUNTER — Inpatient Hospital Stay (HOSPITAL_COMMUNITY): Payer: Medicare Other | Admitting: Anesthesiology

## 2014-07-18 ENCOUNTER — Encounter (HOSPITAL_COMMUNITY)
Admission: RE | Disposition: A | Discharge status: Home Health | Payer: Self-pay | Source: Ambulatory Visit | Attending: Orthopedic Surgery

## 2014-07-18 DIAGNOSIS — Z9049 Acquired absence of other specified parts of digestive tract: Secondary | ICD-10-CM | POA: Diagnosis present

## 2014-07-18 DIAGNOSIS — I509 Heart failure, unspecified: Secondary | ICD-10-CM | POA: Diagnosis present

## 2014-07-18 DIAGNOSIS — Z951 Presence of aortocoronary bypass graft: Secondary | ICD-10-CM | POA: Diagnosis not present

## 2014-07-18 DIAGNOSIS — I10 Essential (primary) hypertension: Secondary | ICD-10-CM | POA: Diagnosis present

## 2014-07-18 DIAGNOSIS — Z8673 Personal history of transient ischemic attack (TIA), and cerebral infarction without residual deficits: Secondary | ICD-10-CM

## 2014-07-18 DIAGNOSIS — M5137 Other intervertebral disc degeneration, lumbosacral region: Secondary | ICD-10-CM | POA: Diagnosis present

## 2014-07-18 DIAGNOSIS — Z794 Long term (current) use of insulin: Secondary | ICD-10-CM | POA: Diagnosis not present

## 2014-07-18 DIAGNOSIS — Q6589 Other specified congenital deformities of hip: Secondary | ICD-10-CM | POA: Diagnosis not present

## 2014-07-18 DIAGNOSIS — M25551 Pain in right hip: Secondary | ICD-10-CM | POA: Diagnosis not present

## 2014-07-18 DIAGNOSIS — G4730 Sleep apnea, unspecified: Secondary | ICD-10-CM | POA: Diagnosis present

## 2014-07-18 DIAGNOSIS — M898X9 Other specified disorders of bone, unspecified site: Secondary | ICD-10-CM | POA: Diagnosis present

## 2014-07-18 DIAGNOSIS — M948X9 Other specified disorders of cartilage, unspecified sites: Secondary | ICD-10-CM | POA: Diagnosis not present

## 2014-07-18 DIAGNOSIS — E119 Type 2 diabetes mellitus without complications: Secondary | ICD-10-CM | POA: Diagnosis present

## 2014-07-18 DIAGNOSIS — I2510 Atherosclerotic heart disease of native coronary artery without angina pectoris: Secondary | ICD-10-CM | POA: Diagnosis present

## 2014-07-18 DIAGNOSIS — Z95810 Presence of automatic (implantable) cardiac defibrillator: Secondary | ICD-10-CM

## 2014-07-18 DIAGNOSIS — D62 Acute posthemorrhagic anemia: Secondary | ICD-10-CM | POA: Diagnosis not present

## 2014-07-18 DIAGNOSIS — M898X8 Other specified disorders of bone, other site: Secondary | ICD-10-CM | POA: Diagnosis not present

## 2014-07-18 HISTORY — PX: MASS EXCISION: SHX2000

## 2014-07-18 LAB — GLUCOSE, CAPILLARY
GLUCOSE-CAPILLARY: 144 mg/dL — AB (ref 70–99)
Glucose-Capillary: 119 mg/dL — ABNORMAL HIGH (ref 70–99)
Glucose-Capillary: 139 mg/dL — ABNORMAL HIGH (ref 70–99)

## 2014-07-18 LAB — MRSA PCR SCREENING: MRSA by PCR: NEGATIVE

## 2014-07-18 SURGERY — EXCISION MASS
Anesthesia: General | Site: Hip | Laterality: Right

## 2014-07-18 MED ORDER — PHENYLEPHRINE HCL 10 MG/ML IJ SOLN
INTRAMUSCULAR | Status: AC
  Filled 2014-07-18: qty 2

## 2014-07-18 MED ORDER — PHENYLEPHRINE HCL 10 MG/ML IJ SOLN
INTRAMUSCULAR | Status: DC | PRN
  Administered 2014-07-18 (×5): 80 ug via INTRAVENOUS

## 2014-07-18 MED ORDER — PROMETHAZINE HCL 25 MG/ML IJ SOLN: 6.2500 mg | INTRAMUSCULAR | Status: DC | PRN

## 2014-07-18 MED ORDER — ONDANSETRON HCL 4 MG/2ML IJ SOLN
INTRAMUSCULAR | Status: AC
  Filled 2014-07-18: qty 2

## 2014-07-18 MED ORDER — LIDOCAINE HCL (CARDIAC) 20 MG/ML IV SOLN
INTRAVENOUS | Status: AC
  Filled 2014-07-18: qty 5

## 2014-07-18 MED ORDER — SODIUM CHLORIDE 0.9 % IR SOLN
Status: DC | PRN
  Administered 2014-07-18: 3000 mL

## 2014-07-18 MED ORDER — DOCUSATE SODIUM 100 MG PO CAPS
100.0000 mg | ORAL_CAPSULE | Freq: Two times a day (BID) | ORAL | Status: DC
  Administered 2014-07-20: 100 mg via ORAL
  Filled 2014-07-18 (×5): qty 1

## 2014-07-18 MED ORDER — BISACODYL 5 MG PO TBEC: 5.0000 mg | DELAYED_RELEASE_TABLET | Freq: Every day | ORAL | Status: DC | PRN

## 2014-07-18 MED ORDER — FLEET ENEMA 7-19 GM/118ML RE ENEM: 1.0000 | ENEMA | Freq: Once | RECTAL | Status: AC | PRN

## 2014-07-18 MED ORDER — PHENYLEPHRINE HCL 10 MG/ML IJ SOLN
20.0000 mg | INTRAMUSCULAR | Status: DC | PRN
  Administered 2014-07-18: 50 ug/min via INTRAVENOUS

## 2014-07-18 MED ORDER — HYDROMORPHONE HCL 1 MG/ML IJ SOLN
INTRAMUSCULAR | Status: AC
  Filled 2014-07-18: qty 1

## 2014-07-18 MED ORDER — MIDAZOLAM HCL 5 MG/5ML IJ SOLN
INTRAMUSCULAR | Status: DC | PRN
  Administered 2014-07-18: 1 mg via INTRAVENOUS

## 2014-07-18 MED ORDER — FENTANYL CITRATE 0.05 MG/ML IJ SOLN
INTRAMUSCULAR | Status: AC
  Filled 2014-07-18: qty 5

## 2014-07-18 MED ORDER — CARVEDILOL 25 MG PO TABS
25.0000 mg | ORAL_TABLET | Freq: Two times a day (BID) | ORAL | Status: DC
  Administered 2014-07-19 – 2014-07-20 (×3): 25 mg via ORAL
  Filled 2014-07-18 (×6): qty 1

## 2014-07-18 MED ORDER — OXYCODONE HCL 5 MG/5ML PO SOLN: 5.0000 mg | Freq: Once | ORAL | Status: AC | PRN

## 2014-07-18 MED ORDER — INSULIN ASPART 100 UNIT/ML ~~LOC~~ SOLN
0.0000 [IU] | Freq: Three times a day (TID) | SUBCUTANEOUS | Status: DC
  Administered 2014-07-19 (×2): 3 [IU] via SUBCUTANEOUS
  Administered 2014-07-20 (×2): 2 [IU] via SUBCUTANEOUS

## 2014-07-18 MED ORDER — BUPIVACAINE-EPINEPHRINE 0.5% -1:200000 IJ SOLN
INTRAMUSCULAR | Status: DC | PRN
  Administered 2014-07-18: 17 mL

## 2014-07-18 MED ORDER — OXYCODONE HCL 5 MG PO TABS
5.0000 mg | ORAL_TABLET | ORAL | Status: DC | PRN
  Administered 2014-07-18: 5 mg via ORAL
  Administered 2014-07-19 – 2014-07-20 (×8): 10 mg via ORAL
  Filled 2014-07-18 (×8): qty 2

## 2014-07-18 MED ORDER — ONDANSETRON HCL 4 MG/2ML IJ SOLN
4.0000 mg | Freq: Four times a day (QID) | INTRAMUSCULAR | Status: DC | PRN
  Administered 2014-07-19 – 2014-07-20 (×2): 4 mg via INTRAVENOUS
  Filled 2014-07-18 (×3): qty 2

## 2014-07-18 MED ORDER — LACTATED RINGERS IV SOLN
INTRAVENOUS | Status: DC | PRN
  Administered 2014-07-18: 13:00:00 via INTRAVENOUS

## 2014-07-18 MED ORDER — METHOCARBAMOL 500 MG PO TABS
500.0000 mg | ORAL_TABLET | Freq: Four times a day (QID) | ORAL | Status: DC | PRN
  Administered 2014-07-18 – 2014-07-20 (×3): 500 mg via ORAL
  Filled 2014-07-18 (×3): qty 1

## 2014-07-18 MED ORDER — KCL IN DEXTROSE-NACL 20-5-0.45 MEQ/L-%-% IV SOLN
INTRAVENOUS | Status: DC
  Administered 2014-07-18 – 2014-07-20 (×2): via INTRAVENOUS
  Filled 2014-07-18 (×9): qty 1000

## 2014-07-18 MED ORDER — SUCCINYLCHOLINE CHLORIDE 20 MG/ML IJ SOLN
INTRAMUSCULAR | Status: DC | PRN
  Administered 2014-07-18: 120 mg via INTRAVENOUS

## 2014-07-18 MED ORDER — HYDROMORPHONE HCL 1 MG/ML IJ SOLN
1.0000 mg | INTRAMUSCULAR | Status: DC | PRN
  Administered 2014-07-18 – 2014-07-20 (×11): 1 mg via INTRAVENOUS
  Filled 2014-07-18 (×9): qty 1

## 2014-07-18 MED ORDER — ONDANSETRON HCL 4 MG/2ML IJ SOLN
INTRAMUSCULAR | Status: DC | PRN
  Administered 2014-07-18: 4 mg via INTRAVENOUS

## 2014-07-18 MED ORDER — HYDROMORPHONE HCL 1 MG/ML IJ SOLN
0.2500 mg | INTRAMUSCULAR | Status: DC | PRN
Start: 2014-07-18 — End: 2014-07-18
  Administered 2014-07-18 (×3): 0.5 mg via INTRAVENOUS
  Administered 2014-07-18: 0.25 mg via INTRAVENOUS

## 2014-07-18 MED ORDER — OXYCODONE HCL 5 MG PO TABS
5.0000 mg | ORAL_TABLET | Freq: Once | ORAL | Status: AC | PRN
  Administered 2014-07-18: 5 mg via ORAL

## 2014-07-18 MED ORDER — ONDANSETRON HCL 4 MG PO TABS: 4.0000 mg | ORAL_TABLET | Freq: Four times a day (QID) | ORAL | Status: DC | PRN

## 2014-07-18 MED ORDER — ACETAMINOPHEN 10 MG/ML IV SOLN
INTRAVENOUS | Status: DC | PRN
  Administered 2014-07-18: 1000 mg via INTRAVENOUS

## 2014-07-18 MED ORDER — ACETAMINOPHEN 650 MG RE SUPP: 650.0000 mg | Freq: Four times a day (QID) | RECTAL | Status: DC | PRN

## 2014-07-18 MED ORDER — ASPIRIN 325 MG PO TABS
325.0000 mg | ORAL_TABLET | Freq: Two times a day (BID) | ORAL | Status: DC
  Administered 2014-07-18 – 2014-07-20 (×4): 325 mg via ORAL
  Filled 2014-07-18 (×5): qty 1

## 2014-07-18 MED ORDER — SODIUM CHLORIDE 0.9 % IV SOLN
INTRAVENOUS | Status: DC
  Administered 2014-07-18: 12:00:00 via INTRAVENOUS

## 2014-07-18 MED ORDER — ACETAMINOPHEN 325 MG PO TABS: 650.0000 mg | ORAL_TABLET | Freq: Four times a day (QID) | ORAL | Status: DC | PRN

## 2014-07-18 MED ORDER — OXYCODONE HCL 5 MG PO TABS
ORAL_TABLET | ORAL | Status: AC
  Filled 2014-07-18: qty 1

## 2014-07-18 MED ORDER — INSULIN GLARGINE 100 UNIT/ML ~~LOC~~ SOLN
26.0000 [IU] | Freq: Every day | SUBCUTANEOUS | Status: DC
  Administered 2014-07-18 – 2014-07-19 (×2): 26 [IU] via SUBCUTANEOUS
  Filled 2014-07-18 (×3): qty 0.26

## 2014-07-18 MED ORDER — KCL IN DEXTROSE-NACL 20-5-0.45 MEQ/L-%-% IV SOLN
INTRAVENOUS | Status: AC
  Filled 2014-07-18: qty 1000

## 2014-07-18 MED ORDER — LIDOCAINE HCL (CARDIAC) 20 MG/ML IV SOLN
INTRAVENOUS | Status: DC | PRN
  Administered 2014-07-18: 70 mg via INTRAVENOUS

## 2014-07-18 MED ORDER — SODIUM CHLORIDE 0.9 % IV SOLN
INTRAVENOUS | Status: DC | PRN
  Administered 2014-07-18: 13:00:00 via INTRAVENOUS

## 2014-07-18 MED ORDER — PROPOFOL 10 MG/ML IV BOLUS
INTRAVENOUS | Status: AC
  Filled 2014-07-18: qty 20

## 2014-07-18 MED ORDER — METOCLOPRAMIDE HCL 5 MG PO TABS
5.0000 mg | ORAL_TABLET | Freq: Three times a day (TID) | ORAL | Status: DC | PRN
  Filled 2014-07-18: qty 2

## 2014-07-18 MED ORDER — DEXTROSE 5 % IV SOLN
500.0000 mg | Freq: Four times a day (QID) | INTRAVENOUS | Status: DC | PRN
Start: 2014-07-18 — End: 2014-07-20
  Filled 2014-07-18: qty 5

## 2014-07-18 MED ORDER — TORSEMIDE 20 MG PO TABS
20.0000 mg | ORAL_TABLET | Freq: Two times a day (BID) | ORAL | Status: DC
  Administered 2014-07-18 – 2014-07-20 (×4): 20 mg via ORAL
  Filled 2014-07-18 (×6): qty 1

## 2014-07-18 MED ORDER — FENTANYL CITRATE 0.05 MG/ML IJ SOLN
INTRAMUSCULAR | Status: DC | PRN
  Administered 2014-07-18 (×2): 50 ug via INTRAVENOUS
  Administered 2014-07-18: 100 ug via INTRAVENOUS
  Administered 2014-07-18: 50 ug via INTRAVENOUS

## 2014-07-18 MED ORDER — TRANEXAMIC ACID 100 MG/ML IV SOLN
2000.0000 mg | INTRAVENOUS | Status: AC
  Administered 2014-07-18: 2000 mg via TOPICAL
  Filled 2014-07-18: qty 20

## 2014-07-18 MED ORDER — METOCLOPRAMIDE HCL 5 MG/ML IJ SOLN
5.0000 mg | Freq: Three times a day (TID) | INTRAMUSCULAR | Status: DC | PRN
  Administered 2014-07-20: 10 mg via INTRAVENOUS
  Filled 2014-07-18 (×2): qty 2

## 2014-07-18 MED ORDER — CHLORHEXIDINE GLUCONATE 4 % EX LIQD: 60.0000 mL | Freq: Once | CUTANEOUS | Status: DC

## 2014-07-18 MED ORDER — SENNOSIDES-DOCUSATE SODIUM 8.6-50 MG PO TABS: 1.0000 | ORAL_TABLET | Freq: Every evening | ORAL | Status: DC | PRN

## 2014-07-18 MED ORDER — MIDAZOLAM HCL 2 MG/2ML IJ SOLN
INTRAMUSCULAR | Status: AC
  Filled 2014-07-18: qty 2

## 2014-07-18 MED ORDER — ACETAMINOPHEN 10 MG/ML IV SOLN
INTRAVENOUS | Status: AC
  Filled 2014-07-18: qty 100

## 2014-07-18 MED ORDER — BUPIVACAINE-EPINEPHRINE (PF) 0.5% -1:200000 IJ SOLN
INTRAMUSCULAR | Status: AC
  Filled 2014-07-18: qty 30

## 2014-07-18 MED ORDER — PROPOFOL 10 MG/ML IV BOLUS
INTRAVENOUS | Status: DC | PRN
  Administered 2014-07-18: 130 mg via INTRAVENOUS

## 2014-07-18 MED ORDER — METHOCARBAMOL 500 MG PO TABS
ORAL_TABLET | ORAL | Status: AC
  Filled 2014-07-18: qty 1

## 2014-07-18 SURGICAL SUPPLY — 64 items
BLADE SAW SGTL 81X20 HD (BLADE) ×3 IMPLANT
BUR 4.0 RND (BURR) ×2 IMPLANT
BUR 4.0MM RND (BURR) ×1
BUR 7 SOFT (BURR) ×2 IMPLANT
BUR 7MM SOFT (BURR) ×1
COVER SURGICAL LIGHT HANDLE (MISCELLANEOUS) ×3 IMPLANT
DRAPE IMP U-DRAPE 54X76 (DRAPES) ×3 IMPLANT
DRAPE ORTHO SPLIT 77X108 STRL (DRAPES) ×4
DRAPE PROXIMA HALF (DRAPES) ×3 IMPLANT
DRAPE SURG ORHT 6 SPLT 77X108 (DRAPES) ×2 IMPLANT
DRAPE U-SHAPE 47X51 STRL (DRAPES) ×3 IMPLANT
DRSG AQUACEL AG ADV 3.5X10 (GAUZE/BANDAGES/DRESSINGS) ×3 IMPLANT
DRSG PAD ABDOMINAL 8X10 ST (GAUZE/BANDAGES/DRESSINGS) ×3 IMPLANT
DURAPREP 26ML APPLICATOR (WOUND CARE) ×6 IMPLANT
ELECT BLADE 4.0 EZ CLEAN MEGAD (MISCELLANEOUS) ×3
ELECT CAUTERY BLADE 6.4 (BLADE) IMPLANT
ELECT REM PT RETURN 9FT ADLT (ELECTROSURGICAL)
ELECTRODE BLDE 4.0 EZ CLN MEGD (MISCELLANEOUS) ×1 IMPLANT
ELECTRODE REM PT RTRN 9FT ADLT (ELECTROSURGICAL) IMPLANT
EVACUATOR 1/8 PVC DRAIN (DRAIN) ×3 IMPLANT
GAUZE SPONGE 4X4 12PLY STRL (GAUZE/BANDAGES/DRESSINGS) ×3 IMPLANT
GAUZE XEROFORM 5X9 LF (GAUZE/BANDAGES/DRESSINGS) ×3 IMPLANT
GLOVE BIO SURGEON STRL SZ7.5 (GLOVE) ×3 IMPLANT
GLOVE BIO SURGEON STRL SZ8.5 (GLOVE) ×3 IMPLANT
GLOVE BIOGEL M STRL SZ7.5 (GLOVE) ×3 IMPLANT
GLOVE BIOGEL PI IND STRL 6.5 (GLOVE) ×1 IMPLANT
GLOVE BIOGEL PI IND STRL 8 (GLOVE) ×1 IMPLANT
GLOVE BIOGEL PI IND STRL 9 (GLOVE) ×1 IMPLANT
GLOVE BIOGEL PI INDICATOR 6.5 (GLOVE) ×2
GLOVE BIOGEL PI INDICATOR 8 (GLOVE) ×2
GLOVE BIOGEL PI INDICATOR 9 (GLOVE) ×2
GLOVE ECLIPSE 6.5 STRL STRAW (GLOVE) ×3 IMPLANT
GOWN STRL REUS W/ TWL LRG LVL3 (GOWN DISPOSABLE) ×2 IMPLANT
GOWN STRL REUS W/ TWL XL LVL3 (GOWN DISPOSABLE) ×3 IMPLANT
GOWN STRL REUS W/TWL LRG LVL3 (GOWN DISPOSABLE) ×4
GOWN STRL REUS W/TWL XL LVL3 (GOWN DISPOSABLE) ×6
HANDPIECE INTERPULSE COAX TIP (DISPOSABLE)
KIT BASIN OR (CUSTOM PROCEDURE TRAY) ×3 IMPLANT
KIT ROOM TURNOVER OR (KITS) ×3 IMPLANT
MANIFOLD NEPTUNE II (INSTRUMENTS) ×3 IMPLANT
NS IRRIG 1000ML POUR BTL (IV SOLUTION) ×3 IMPLANT
PACK TOTAL JOINT (CUSTOM PROCEDURE TRAY) ×3 IMPLANT
PACK UNIVERSAL I (CUSTOM PROCEDURE TRAY) ×3 IMPLANT
PAD ARMBOARD 7.5X6 YLW CONV (MISCELLANEOUS) ×6 IMPLANT
SET HNDPC FAN SPRY TIP SCT (DISPOSABLE) IMPLANT
SPONGE LAP 18X18 X RAY DECT (DISPOSABLE) ×6 IMPLANT
SUT BONE WAX W31G (SUTURE) ×3 IMPLANT
SUT ETHIBOND 2 V 37 (SUTURE) ×3 IMPLANT
SUT PDS AB 1 CT  36 (SUTURE)
SUT PDS AB 1 CT 36 (SUTURE) IMPLANT
SUT PDS AB 2-0 CT1 27 (SUTURE) IMPLANT
SUT VIC AB 0 CT1 27 (SUTURE) ×2
SUT VIC AB 0 CT1 27XBRD ANBCTR (SUTURE) ×1 IMPLANT
SUT VIC AB 1 CTX 36 (SUTURE) ×2
SUT VIC AB 1 CTX36XBRD ANBCTR (SUTURE) ×1 IMPLANT
SUT VIC AB 2-0 CT1 27 (SUTURE) ×2
SUT VIC AB 2-0 CT1 36 (SUTURE) ×3 IMPLANT
SUT VIC AB 2-0 CT1 TAPERPNT 27 (SUTURE) ×1 IMPLANT
SUT VIC AB 3-0 X1 27 (SUTURE) ×3 IMPLANT
TOWEL OR 17X24 6PK STRL BLUE (TOWEL DISPOSABLE) ×3 IMPLANT
TOWEL OR 17X26 10 PK STRL BLUE (TOWEL DISPOSABLE) ×3 IMPLANT
TUBE ANAEROBIC SPECIMEN COL (MISCELLANEOUS) IMPLANT
UNDERPAD 30X30 INCONTINENT (UNDERPADS AND DIAPERS) ×3 IMPLANT
WATER STERILE IRR 1000ML POUR (IV SOLUTION) ×3 IMPLANT

## 2014-07-18 NOTE — Telephone Encounter (Signed)
Called and spoke to the [employer] representative, Whitney Post, with a request, per Dr. Valere Dross, to be present during radiation therapy to Mr. Granieri's left hip on 07/19/14 at 1pm followed by a defibrillator check after completion of the treatment.  After being asked if a Magnet needed to be placed over the defibrillator during treatment; Kathlyn Sacramento stated, per the manufacture's guidelines(manual), a magnet does not need to be placed over the defibrillator since there is such a marked distance from the debrillator in his left shoulder to the left hip treatment field. Kathlyn Sacramento will be here at ~1:15pm and will check the defibrillator at the end of the treatment phase at 1:30 pm.

## 2014-07-18 NOTE — Progress Notes (Signed)
Complex simulation/treatment planning note: The patient completed his treatment planning today for treatment to his right hip.  He was set up to anterior and posterior oblique fields.  2 separate and unique MLCs were constructed for the anterior oblique field and 1 separate and unique MLC was constructed for the posterior oblique field for a total of 3 complex treatment devices.  I prescribing 700 cGy utilizing 10 MV photons.  His isodose plan was approved and I chose the 100% isodose curve for the prescription dose.

## 2014-07-18 NOTE — Op Note (Signed)
OPERATIVE REPORT    DATE OF PROCEDURE:  07/18/2014       PREOPERATIVE DIAGNOSIS:  RIGHT HIP HETEROTOPIC BONE                                                          POSTOPERATIVE DIAGNOSIS:  right hip heteropic bone                                                           PROCEDURE: Excision of heterotopic bone right hip   SURGEON: [id number]    ASSISTANT:   Kerry Hough. Paul [name]  (present throughout entire procedure and necessary for timely completion of the procedure)   ANESTHESIA: General BLOOD LOSS: 300 FLUID REPLACEMENT: 1500 crystalloid DRAINS: Foley Catheter URINE OUTPUT: 0000000 COMPLICATIONS: none    INDICATIONS FOR PROCEDURE: A 49 y.o. year-old With  RIGHT HIP HETEROTOPIC BONE   for greater than 10 years years, CT scan shows heterotopic bone bridging from superior to posterior inferior with impingement and very limited motion. The hip joint itself is in good condition the hardware that was used for his pelvic fracture is in good position and there is no evidence of osteoarthritis to the joint itself. The joint is congruent with a normal air cartilage by CT scan. He is taken for excision of the heterotopic bone to decrease pain and increase motion.. The risks, benefits, and alternatives were discussed at length including but not limited to the risks of infection, bleeding, nerve injury, stiffness, blood clots, the need for revision surgery, cardiopulmonary complications, among others, and they were willing to proceed. Questions answered     PROCEDURE IN DETAIL: The patient was identified by armband,  received preoperative IV antibiotics in the holding area at [facility], taken to the operating room , appropriate anesthetic monitors  were attached and general endotracheal anesthesia induced. Foley catheter was inserted. Pt was rolled into the L lateral decubitus position and fixed there with a Stulberg Mark II pelvic clamp.  The R lower extremity was then prepped and  draped  in the usual sterile fashion from the ankle to the hemipelvis. A time-out  procedure was performed. The skin along the lateral hip and thigh  infiltrated with 10 mL of 0.5% Marcaine and epinephrine solution. We  then made a posterolateral approach to the hip. With a #10 blade, a 15 cm  incision was made through the skin and subcutaneous tissue down to the level of the  IT band. Small bleeders were identified and cauterized. The IT band was cut in  line with skin incision exposing the greater trochanter. We then dissected posteriorly finding the interval between the gluteus medius and the gluteus minimus which was partially ossified. A Cobra retractor was placed on top of the heterotopic bone and a Hohmann retractor posterior and inferior. The major bridge was then resected using a one-inch wide osteotome. Once we did this we could see the posterior hip joint capsule and in one area the femoral head which appeared to be in good condition. 2 more large areas of heterotopic bone posteriorly and inferiorly were also identified and  removed with the osteotome and once this accomplished the patient's hip abduction went from 10 to 45 and is internal rotation 1 from 0-40. What we see of the femoral neck and head appeared to be normal. Trans Am a casted on a sponge was used to assist with hemostasis as well as the electrocautery. We placed bone wax and any bleeding cancellus bone that was exposed by the resection. The wound was once again  thoroughly irrigated out with normal saline solution pulse lavage. The IT band was closed with running 1 Vicryl suture. The subcutaneous  tissue with 0 and 2-0 undyed Vicryl suture and the skin with running  interlocking 3-0 nylon suture. Dressing of Xeroform and Mepilex was  then applied. The patient was then unclamped, rolled supine, awaken extubated and taken to recovery room without difficulty in stable condition.   Paul [name] J 07/18/2014, 2:30 PM

## 2014-07-18 NOTE — Progress Notes (Signed)
Left message for Stevens with [employer] to call us.

## 2014-07-18 NOTE — OR Nursing (Signed)
Magnet placed over AICD in operating room during surgery by M. [place]

## 2014-07-18 NOTE — Anesthesia Preprocedure Evaluation (Signed)
Anesthesia Evaluation  Patient identified by MRN, date of birth, ID band Patient awake    Reviewed: Allergy & Precautions, NPO status , Patient's Chart, lab work & pertinent test results  History of Anesthesia Complications Negative for: history of anesthetic complications  Airway Mallampati: I   Neck ROM: Full    Dental  (+) Edentulous Upper   Pulmonary sleep apnea ,  breath sounds clear to auscultation        Cardiovascular hypertension, + CAD, + CABG and +CHF + Cardiac Defibrillator Rhythm:Regular Rate:Normal  EF 25-30%,EKG is abnormal and previously so, but essentially unchanged   Neuro/Psych CVA    GI/Hepatic Neg liver ROS,   Endo/Other  diabetes, Poorly ControlledMorbid obesity  Renal/GU Renal InsufficiencyRenal disease     Musculoskeletal  (+) Arthritis -,   Abdominal (+) + obese,   Peds  Hematology   Anesthesia Other Findings   Reproductive/Obstetrics                             Anesthesia Physical Anesthesia Plan  ASA: IV  Anesthesia Plan: General   Post-op Pain Management:    Induction: Intravenous  Airway Management Planned: Oral ETT  Additional Equipment: Arterial line  Intra-op Plan:   Post-operative Plan: Extubation in OR  Informed Consent: I have reviewed the patients History and Physical, chart, labs and discussed the procedure including the risks, benefits and alternatives for the proposed anesthesia with the patient or authorized representative who has indicated his/her understanding and acceptance.   Dental advisory given  Plan Discussed with: Surgeon  Anesthesia Plan Comments:         Anesthesia Quick Evaluation

## 2014-07-18 NOTE — Interval H&P Note (Signed)
History and Physical Interval Note:  07/18/2014 12:37 PM  Paul Ala.  has presented today for surgery, with the diagnosis of RIGHT HIP HETEROTOPIC BONE  The various methods of treatment have been discussed with the patient and family. After consideration of risks, benefits and other options for treatment, the patient has consented to  Procedure(s): EXCISION HETEROTOPIC BONE RIGHT HIP (Right) as a surgical intervention .  The patient's history has been reviewed, patient examined, no change in status, stable for surgery.  I have reviewed the patient's chart and labs.  Questions were answered to the patient's satisfaction.     Kerin Salen

## 2014-07-18 NOTE — Anesthesia Procedure Notes (Signed)
Procedure Name: Intubation Date/Time: 07/18/2014 1:06 PM Performed by: Greggory Stallion, Cheryl Chay L Pre-anesthesia Checklist: Patient identified, Emergency Drugs available, Patient being monitored, Suction available and Timeout performed Patient Re-evaluated:Patient Re-evaluated prior to inductionOxygen Delivery Method: Circle system utilized Preoxygenation: Pre-oxygenation with 100% oxygen Intubation Type: IV induction Ventilation: Oral airway inserted - appropriate to patient size and Mask ventilation without difficulty Laryngoscope Size: Mac and 4 Tube type: Oral Tube size: 8.0 mm Number of attempts: 1 Airway Equipment and Method: Stylet Placement Confirmation: ETT inserted through vocal cords under direct vision,  positive ETCO2 and breath sounds checked- equal and bilateral Secured at: 22 cm Tube secured with: Tape Dental Injury: Teeth and Oropharynx as per pre-operative assessment

## 2014-07-18 NOTE — Transfer of Care (Signed)
Immediate Anesthesia Transfer of Care Note  Patient: Paul [name].  Procedure(s) Performed: Procedure(s): EXCISION HETEROTOPIC BONE RIGHT HIP (Right)  Patient Location: PACU  Anesthesia Type:General  Level of Consciousness: awake, alert  and oriented  Airway & Oxygen Therapy: Patient Spontanous Breathing and Patient connected to nasal cannula oxygen  Post-op Assessment: Report given to RN and Post -op Vital signs reviewed and stable  Post vital signs: Reviewed and stable  Last Vitals:  Filed Vitals:   07/18/14 1113  BP: 148/90  Pulse: 102  Temp: 36.9 C  Resp: 20    Complications: No apparent anesthesia complications

## 2014-07-18 NOTE — Telephone Encounter (Signed)
Called [facility]( stepdown), and relayed message to Fanny Bien, that the nurse for Mr Kylon Kumpf needs to call [facility] tonight to arrange transport for Mr. Dalbert Batman on tomorrow to arrive in radiation oncology, at the lastest, by 12:45 pm for his 1:00 pm radiation therapy treatment to his left hip.   Called [facility] and spoke to Taren to inform of the need to transport Mr. Teagun Tivnan. Kunselman to Radiation Oncology, on tomorrow, 07/19/14 by 12:45 pm for a 1:00 pm with further conversation that the home unit still needs to call for arrangement of transport. Stated understanding.

## 2014-07-19 ENCOUNTER — Ambulatory Visit
Admit: 2014-07-19 | Discharge: 2014-07-19 | Disposition: A | Discharge status: Home / Self Care | Payer: Medicare Other | Attending: Radiation Oncology | Admitting: Radiation Oncology

## 2014-07-19 ENCOUNTER — Inpatient Hospital Stay: Admit: 2014-07-19 | Payer: Self-pay | Admitting: Radiation Oncology

## 2014-07-19 ENCOUNTER — Encounter (HOSPITAL_COMMUNITY): Payer: Self-pay | Admitting: *Deleted

## 2014-07-19 ENCOUNTER — Ambulatory Visit
Admission: RE | Admit: 2014-07-19 | Discharge: 2014-07-19 | Disposition: A | Discharge status: Home / Self Care | Payer: Medicare Other | Source: Ambulatory Visit | Attending: Radiation Oncology | Admitting: Radiation Oncology

## 2014-07-19 ENCOUNTER — Encounter: Payer: Self-pay | Admitting: Radiation Oncology

## 2014-07-19 VITALS — BP 138/91 | HR 106 | Temp 99.5°F | Resp 16

## 2014-07-19 DIAGNOSIS — M948X9 Other specified disorders of cartilage, unspecified sites: Secondary | ICD-10-CM | POA: Diagnosis not present

## 2014-07-19 DIAGNOSIS — M898X9 Other specified disorders of bone, unspecified site: Secondary | ICD-10-CM

## 2014-07-19 LAB — GLUCOSE, CAPILLARY
Glucose-Capillary: 159 mg/dL — ABNORMAL HIGH (ref 70–99)
Glucose-Capillary: 164 mg/dL — ABNORMAL HIGH (ref 70–99)
Glucose-Capillary: 71 mg/dL (ref 70–99)
Glucose-Capillary: 114 mg/dL — ABNORMAL HIGH (ref 70–99)
Glucose-Capillary: 170 mg/dL — ABNORMAL HIGH (ref 70–99)

## 2014-07-19 NOTE — Progress Notes (Signed)
Mr. Omans given Dilaudid 1 mg IV for c/p level 9/10 pain in his right hip at 2:38pm.  He has received a 1 time treamtne to his right hip and is waiting for the [employer] representative to arrive to check his defibrillator prior to disposition to [facility], [facility].  Temp presently 99.9 orally.  Vitals documented

## 2014-07-19 NOTE — Progress Notes (Signed)
PT Cancellation Note  Patient Details Name: Paul [name]. MRN: [id number] DOB: 1965/12/11   Cancelled Treatment:    Reason Eval/Treat Not Completed: Patient at procedure or test/unavailable (Transport on the way to take pt for radiation at [facility]). Will see when pt returns later.   [name] 07/19/2014, 12:07 PM  [employer] PT [phone]

## 2014-07-19 NOTE — Progress Notes (Signed)
Continuing to wait for [facility].  Paul [name] grades his pain as a llevel 5/10 since receiving pain medication. Resting comfortably at this time. [facility] may be delayed in transporting Paul. [name] back to [place], and he and his nurse on have been informed of the delay.

## 2014-07-19 NOTE — Anesthesia Postprocedure Evaluation (Signed)
  Anesthesia Post-op Note  Patient: Paul [name].  Procedure(s) Performed: Procedure(s): EXCISION HETEROTOPIC BONE RIGHT HIP (Right)  Patient Location: PACU  Anesthesia Type:General  Level of Consciousness: awake and alert   Airway and Oxygen Therapy: Patient Spontanous Breathing  Post-op Pain: mild  Post-op Assessment: Post-op Vital signs reviewed  Post-op Vital Signs: stable  Last Vitals:  Filed Vitals:   07/19/14 0800  BP: 138/87  Pulse: 103  Temp:   Resp: 16    Complications: No apparent anesthesia complications

## 2014-07-19 NOTE — Progress Notes (Addendum)
Paul [name] transported back to  [place] via [place] at 3:45pm.  Difibrillator was checked by [employer] Rep and report was negative for any changes in function.  Report scanned into Epic and original copy was sent back to inpatient unit.

## 2014-07-19 NOTE — Progress Notes (Signed)
Patient's home care [facility]. Equipment included bedside commode and rolling walker. Equipment taken to patient's room on [place].  Roxan Hockey, RN

## 2014-07-19 NOTE — Care Management Note (Signed)
    Page 1 of 2   07/19/2014     11:47:12 AM CARE MANAGEMENT NOTE 07/19/2014  Patient:  AERIC, WERLING   Account Number:  [id number]  Date Initiated:  07/19/2014  Documentation initiated by:  Elissa Hefty  Subjective/Objective Assessment:   adm w heterotopic ossif hip     Action/Plan:   lives w wife, pcp dr Wallene Dales   Anticipated DC Date:  07/20/2014   Anticipated DC Plan:  [place]  CM consult      PAC Choice  [place]   Choice offered to / List presented to:  C-1 Patient   DME arranged  [place]  3-N-1      DME agency  [place] arranged  [place].   Status of service:   Medicare Important Message given?   (If response is "NO", the following Medicare IM given date fields will be blank) Date Medicare IM given:   Medicare IM given by:   Date Additional Medicare IM given:   Additional Medicare IM given by:    Discharge Disposition:  [place]  Per UR Regulation:  Reviewed for med. necessity/level of care/duration of stay  If discussed at [place] of Stay Meetings, dates discussed:    Comments:  3/17 1145 debbie Asmaa Tirpak rn,bsn went over list of hhc agencies w pt. he [facility] for hhpt. ref to donna w [facility]. rw and 3n1 bsc ordered also. pt has no eq. poss dc in am

## 2014-07-19 NOTE — Progress Notes (Signed)
Weekly Management Note Current Dose: 7  Gy  Projected Dose: 7 Gy   Narrative:  The patient presents for routine under treatment assessment.  CBCT/MVCT images/Port film x-rays were reviewed.  The chart was checked. No complaints. Resting comfortably in bed.   Physical Findings: Weight:  . Unchanged  Impression:  The patient tolerated this treatment well.  Plan:  Transfer to [facility] under care of trauma/ortho team.

## 2014-07-19 NOTE — Progress Notes (Signed)
OT Cancellation Note  Patient Details Name: Paul [name]. MRN: [id number] DOB: 17-Sep-1965   Cancelled Treatment:    Reason Eval/Treat Not Completed: Patient at procedure or test/ unavailable (radiation therapy at [facility])  Paul [name] 07/19/2014, 4:00 PM

## 2014-07-19 NOTE — Progress Notes (Signed)
Spoke with ortho tech about ordering a trapeze bar and frame for pt. Ortho is currently out, pt is on the list to get one when it becomes available.

## 2014-07-19 NOTE — Progress Notes (Signed)
Orthopedic Tech Progress Note Patient Details:  Paul [name] 03/06/1966 [id number] OHF not made to fit patient's bed. If patient is to have frame, bed must be switched out Patient ID: Paul Ala., male   DOB: 06-Feb-1966, 49 y.o.   MRN: [id number]   Paul [name] 07/19/2014, 10:40 AM

## 2014-07-19 NOTE — Progress Notes (Signed)
Spoke with [facility], pt has an appt at 1210pm to be picked up by [facility] to get to his radiation appt.

## 2014-07-19 NOTE — Progress Notes (Addendum)
Patient ID: Paul Ala., male   DOB: Jun 05, 1965, 49 y.o.   MRN: [id number] PATIENT ID: Paul Ala.  MRN: [id number]  DOB/AGE:  [birth year] / 49 y.o.  1 Day Post-Op Procedure(s) (LRB): EXCISION HETEROTOPIC BONE RIGHT HIP (Right)    PROGRESS NOTE Subjective: Patient is alert, oriented, no Nausea, no Vomiting, yes passing gas, no Bowel Movement. Taking PO well. Denies SOB, Chest or Calf Pain. Using Incentive Spirometer, PAS in place. Ambulate WBAT Patient reports pain as 3 on 0-10 scale  .    Objective: Vital signs in last 24 hours: Filed Vitals:   07/19/14 0600 07/19/14 0700 07/19/14 0730 07/19/14 0744  BP: 131/69     Pulse: 94 100  101  Temp:   98.3 F (36.8 C)   TempSrc:   Oral   Resp: 12 20  16   Height:      Weight:      SpO2: 99% 100%  100%      Intake/Output from previous day: I/O last 3 completed shifts: In: 2200 [P.O.:600; I.V.:1600] Out: 3000 [Urine:2550; Drains:150; Blood:300]   Intake/Output this shift:     LABORATORY DATA:  Recent Labs  07/16/14 1558 07/18/14 1119 07/18/14 1524 07/18/14 2047  WBC 9.1  --   --   --   HGB 11.8*  --   --   --   HCT 36.5*  --   --   --   PLT 248  --   --   --   NA 132*  --   --   --   K 3.8  --   --   --   CL 97  --   --   --   CO2 24  --   --   --   BUN 36*  --   --   --   CREATININE 1.64*  --   --   --   GLUCOSE 251*  --   --   --   GLUCAP  --  144* 119* 139*  INR 1.18  --   --   --   CALCIUM 8.9  --   --   --     Examination: Neurologically intact ABD soft Neurovascular intact Sensation intact distally Intact pulses distally Dorsiflexion/Plantar flexion intact Incision: dressing C/D/I No cellulitis present Compartment soft} XR AP removal of over 80% of the heterotopic ossification. Right hip drain discontinued light and plasma had separated.  Assessment:   1 Day Post-Op Procedure(s) (LRB): EXCISION HETEROTOPIC BONE RIGHT HIP (Right) ADDITIONAL DIAGNOSIS:  Expected Acute Blood Loss Anemia,  Diabetes, congestive heart failure chronic with ejection fraction of 30%, HTN.  Plan: PT/OT WBAT, THA  posterior precautions  DVT Prophylaxis: SCDx72 hrs, ASA 325 mg BID x 2 weeks  DISCHARGE PLAN: Home  DISCHARGE NEEDS: HHPT and Walker

## 2014-07-19 NOTE — Evaluation (Signed)
Physical Therapy Evaluation Patient Details Name: Ayzen Dondiego. MRN: [id number] DOB: 04-04-1966 Today's Date: 07/19/2014   History of Present Illness  49 y.o. male s/p EXCISION HETEROTOPIC BONE RIGHT HIP  Clinical Impression  Patient is s/p above procedure and presents with functional limitations due to the deficits listed below (see PT Problem List). Ambulates slowly with a rolling walker up to 210 feet today. Reviewed hip precautions, bed mobility, and transfers. Motivated to improve and will have 24 hour supervision at home as needed. Patient will benefit from skilled PT to increase their independence and safety with mobility to allow discharge to the venue listed below.       Follow Up Recommendations Home health PT;Supervision for mobility/OOB    Equipment Recommendations  Rolling walker with 5" wheels;3in1 (PT) (already delivered to room)    Recommendations for Other Services OT consult     Precautions / Restrictions Precautions Precautions: Posterior Hip Precaution Booklet Issued: Yes (comment) Precaution Comments: reviewed Restrictions Weight Bearing Restrictions: Yes RLE Weight Bearing: Weight bearing as tolerated      Mobility  Bed Mobility Overal bed mobility: Needs Assistance Bed Mobility: Rolling;Sidelying to Sit;Sit to Sidelying Rolling: Supervision Sidelying to sit: Supervision     Sit to sidelying: Supervision General bed mobility comments: Educated on log roll technique to exit bed with use of pillow between knees to maintain posterior hip precautions. No physical assist required.  Transfers Overall transfer level: Needs assistance Equipment used: Rolling walker (2 wheeled) Transfers: Sit to/from Stand Sit to Stand: Min guard         General transfer comment: Min guard for safety. Cues to extend hip anteriorly to maintain posterior hip precautions. Cues for hand placement. Performed from lowest bed setting x2.  Ambulation/Gait Ambulation/Gait  assistance: Supervision Ambulation Distance (Feet): 210 Feet Assistive device: Rolling walker (2 wheeled) Gait Pattern/deviations: Step-through pattern;Decreased step length - left;Decreased stance time - right;Antalgic;Trunk flexed Gait velocity: decreased Gait velocity interpretation: Below normal speed for age/gender General Gait Details: Educated on safe DME use with a rolling walker. no buckling noted. Cues for upright posture and forward gaze intermittently. Mildly antalgic but appears to bear majority of weight through RLE. Required single standing rest break to complete this distance.  Stairs            Wheelchair Mobility    Modified Rankin (Stroke Patients Only)       Balance Overall balance assessment: Needs assistance Sitting-balance support: Feet supported;No upper extremity supported Sitting balance-Leahy Scale: Good     Standing balance support: No upper extremity supported;During functional activity Standing balance-Leahy Scale: Fair                               Pertinent Vitals/Pain Pain Assessment: 0-10 Pain Score:  ("just sore in my groin" no value given) Pain Location: groin Pain Descriptors / Indicators: Sore Pain Intervention(s): Limited activity within patient's tolerance;Monitored during session;Repositioned  HR 117 at rest    Home Living Family/patient expects to be discharged to:: Private residence Living Arrangements: Spouse/significant other Available Help at Discharge: Family;Available 24 hours/day Type of Home: House Home Access: Level entry     Home Layout: One level Home Equipment: None      Prior Function Level of Independence: Independent         Comments: Works.     Hand Dominance   Dominant Hand: Right    Extremity/Trunk Assessment   Upper Extremity Assessment: Defer  to OT evaluation           Lower Extremity Assessment: RLE deficits/detail RLE Deficits / Details: decreased strength and ROM as  expected post procedure.       Communication   Communication: No difficulties  Cognition Arousal/Alertness: Awake/alert Behavior During Therapy: WFL for tasks assessed/performed Overall Cognitive Status: Within Functional Limits for tasks assessed                      General Comments General comments (skin integrity, edema, etc.): Spoke with Pt and his wife about safety with mobility    Exercises        Assessment/Plan    PT Assessment Patient needs continued PT services  PT Diagnosis Difficulty walking;Abnormality of gait;Acute pain   PT Problem List Decreased strength;Decreased range of motion;Decreased activity tolerance;Decreased balance;Decreased mobility;Decreased knowledge of use of DME;Decreased knowledge of precautions;Obesity;Pain  PT Treatment Interventions DME instruction;Gait training;Functional mobility training;Therapeutic activities;Therapeutic exercise;Balance training;Neuromuscular re-education;Patient/family education;Modalities   PT Goals (Current goals can be found in the Care Plan section) Acute Rehab PT Goals Patient Stated Goal: Get back to working out PT Goal Formulation: With patient Time For Goal Achievement: 07/26/14 Potential to Achieve Goals: Good    Frequency Min 5X/week   Barriers to discharge        Co-evaluation               End of Session Equipment Utilized During Treatment: Gait belt Activity Tolerance: Patient tolerated treatment well Patient left: in bed;with call bell/phone within reach;with family/visitor present Nurse Communication: Mobility status         Time: [id number] PT Time Calculation (min) (ACUTE ONLY): 28 min   Charges:   PT Evaluation $Initial PT Evaluation Tier I: 1 Procedure PT Treatments $Gait Training: 8-22 mins   PT G CodesEllouise Newer 07/19/2014, 6:47 PM Camille Bal [place], [place]

## 2014-07-20 LAB — HEMOGLOBIN A1C
HEMOGLOBIN A1C: 9.4 % — AB (ref 4.8–5.6)
Mean Plasma Glucose: 223 mg/dL

## 2014-07-20 LAB — GLUCOSE, CAPILLARY
GLUCOSE-CAPILLARY: 142 mg/dL — AB (ref 70–99)
Glucose-Capillary: 190 mg/dL — ABNORMAL HIGH (ref 70–99)

## 2014-07-20 MED ORDER — ASPIRIN EC 325 MG PO TBEC: 325.0000 mg | DELAYED_RELEASE_TABLET | Freq: Two times a day (BID) | ORAL | Status: DC

## 2014-07-20 MED ORDER — OXYCODONE-ACETAMINOPHEN 5-325 MG PO TABS: 1.0000 | ORAL_TABLET | ORAL | Status: DC | PRN

## 2014-07-20 NOTE — Progress Notes (Signed)
Occupational Therapy Evaluation Patient Details Name: Paul [name]. MRN: [id number] DOB: [birth year] Today's Date: 07/20/2014    History of Present Illness 49 y.o. male s/p EXCISION HETEROTOPIC BONE RIGHT HIP   Clinical Impression   Completed all education regarding compensatory techniques and use of AE/DME to maintain posterior hip precautions during ADL and functional mobility for ADL. Pt nauseated during session. Vomited x 4 - nsg aware. Pt ready to d/C home with intermittent S when medically stable.     Follow Up Recommendations  No OT follow up;Supervision - Intermittent    Equipment Recommendations  None recommended by OT    Recommendations for Other Services       Precautions / Restrictions Precautions Precautions: Posterior Hip Precaution Booklet Issued: Yes (comment) Precaution Comments: reviewed Restrictions Weight Bearing Restrictions: Yes RLE Weight Bearing: Weight bearing as tolerated      Mobility Bed Mobility Overal bed mobility: Needs Assistance Bed Mobility: Rolling;Sidelying to Sit;Sit to Sidelying Rolling: Supervision Sidelying to sit: Supervision     Sit to sidelying: Supervision General bed mobility comments: Pt able to return demonstrate technique taught earlier by PT for bed mobility  Transfers Overall transfer level: Needs assistance Equipment used: Rolling walker (2 wheeled)   Sit to Stand: Min guard         General transfer comment: Min guard for safety. Cues to extend hip anteriorly to maintain posterior hip precautions. Cues for hand placement. Performed from lowest bed setting x2.    Balance     Sitting balance-Leahy Scale: Good       Standing balance-Leahy Scale: Fair                              ADL Overall ADL's : Needs assistance/impaired         Upper Body Bathing: Set up   Lower Body Bathing: Moderate assistance   Upper Body Dressing : Supervision/safety   Lower Body Dressing: Moderate  assistance;Sit to/from stand   Toilet Transfer: Ambulation;Min guard   Toileting- Clothing Manipulation and Hygiene: Set up       Functional mobility during ADLs: Min guard;Rolling walker;Cueing for sequencing;Cueing for safety General ADL Comments: Educated pt on compensatory techniques and use of AE/DME for ADL while adhering to post hip precuations. Pt demonstrated understanding. also educated pt on tub transfer techniques using 3 in 1`     Vision     Perception     Praxis      Pertinent Vitals/Pain Pain Assessment: 0-10 Pain Score: 5  Pain Location: R hip Pain Descriptors / Indicators: Aching Pain Intervention(s): Limited activity within patient's tolerance;Monitored during session;Repositioned     Hand Dominance Right   Extremity/Trunk Assessment Upper Extremity Assessment Upper Extremity Assessment: Overall WFL for tasks assessed   Lower Extremity Assessment Lower Extremity Assessment: Defer to PT evaluation RLE:  (posterior hip precautions)   Cervical / Trunk Assessment Cervical / Trunk Assessment: Normal   Communication Communication Communication: No difficulties   Cognition Arousal/Alertness: Awake/alert Behavior During Therapy: WFL for tasks assessed/performed Overall Cognitive Status: Within Functional Limits for tasks assessed                     General Comments       Exercises       Shoulder Instructions      Home Living Family/patient expects to be discharged to:: Private residence Living Arrangements: Spouse/significant other Available Help at Discharge: Family;Available 24 hours/day  Type of Home: House Home Access: Level entry     Home Layout: One level     Bathroom Shower/Tub: Tub/shower unit Shower/tub characteristics: Architectural technologist: Standard Bathroom Accessibility: Yes How Accessible: Accessible via walker Home Equipment: None          Prior Functioning/Environment Level of Independence: Independent         Comments: Works.    OT Diagnosis: Acute pain   OT Problem List: Decreased strength;Decreased activity tolerance;Pain   OT Treatment/Interventions:      OT Goals(Current goals can be found in the care plan section) Acute Rehab OT Goals Patient Stated Goal: Get back to working out OT Goal Formulation: All assessment and education complete, DC therapy  OT Frequency:     Barriers to D/C:            Co-evaluation              End of Session Equipment Utilized During Treatment: Surveyor, mining Communication: Mobility status;Precautions;Other (comment) (need for nausea meds)  Activity Tolerance: Patient tolerated treatment well Patient left: in chair;with call bell/phone within reach   Time: 1150-1215 OT Time Calculation (min): 25 min Charges:  OT General Charges $OT Visit: 1 Procedure OT Evaluation $Initial OT Evaluation Tier I: 1 Procedure OT Treatments $Self Care/Home Management : 8-22 mins G-Codes:    Alie Moudy,HILLARY 2014-08-07, 12:22 PM   [facility], OTR/L  [phone] 2014-08-07

## 2014-07-20 NOTE — Discharge Summary (Signed)
Patient ID: Paul Ala. MRN: [id number] DOB/AGE: 50/01/1966 50 y.o.  Admit date: 07/18/2014 Discharge date: 07/20/2014  Admission Diagnoses:  Active Problems:   Heterotopic ossification of bone   Discharge Diagnoses:  Same  Past Medical History  Diagnosis Date  . DDD (degenerative disc disease), lumbosacral     L5-S1  . Diabetes     Lantus at bedtime  . CHF (congestive heart failure)     takes Torsemide daily  . CHF (congestive heart failure)   . Hypertension     takes Coreg daily  . Cardiac defibrillator in place   . AICD (automatic cardioverter/defibrillator) present   . Stroke 09    no weakness  . Sleep apnea     sleep study yr ago-unable to afford cpap  . Coronary artery disease     Surgeries: Procedure(s): EXCISION HETEROTOPIC BONE RIGHT HIP on 07/18/2014   Consultants:    Discharged Condition: Improved  Hospital Course: Paul Titone. is an 49 y.o. male who was admitted 07/18/2014 for operative treatment of<principal problem not specified>. Patient has severe unremitting pain that affects sleep, daily activities, and work/hobbies. After pre-op clearance the patient was taken to the operating room on 07/18/2014 and underwent  Procedure(s): [place].    Patient was given perioperative antibiotics: Anti-infectives    Start     Dose/Rate Route Frequency Ordered Stop   07/18/14 0600  ceFAZolin (ANCEF) 3 g in dextrose 5 % 50 mL IVPB     3 g 160 mL/hr over 30 Minutes Intravenous On call to O.R. 07/17/14 1410 07/18/14 1323       Patient was given sequential compression devices, early ambulation, and chemoprophylaxis to prevent DVT.  Patient benefited maximally from hospital stay and there were no complications.    Recent vital signs: Patient Vitals for the past 24 hrs:  BP Temp Temp src Pulse Resp SpO2  07/20/14 0500 102/67 mmHg 99.2 F (37.3 C) - 92 16 100 %  07/19/14 2100 (!) 103/59 mmHg 98.9 F (37.2 C) - 91 16 96 %   07/19/14 1700 (!) 152/90 mmHg 99.7 F (37.6 C) Oral (!) 117 16 100 %  07/19/14 1442 (!) 138/91 mmHg 99.9 F (37.7 C) Oral (!) 106 - -     Recent laboratory studies: No results for input(s): WBC, HGB, HCT, PLT, NA, K, CL, CO2, BUN, CREATININE, GLUCOSE, INR, CALCIUM in the last 72 hours.  Invalid input(s): PT, 2   Discharge Medications:     Medication List    TAKE these medications        acetaminophen 500 MG tablet  Commonly known as:  TYLENOL  Take 1,500 mg by mouth every 8 (eight) hours as needed (pain).     aspirin EC 325 MG tablet  Take 1 tablet (325 mg total) by mouth 2 (two) times daily.     carvedilol 25 MG tablet  Commonly known as:  COREG  Take 25 mg by mouth 2 (two) times daily with a meal.     insulin glargine 100 UNIT/ML injection  Commonly known as:  LANTUS  Inject 26 Units into the skin at bedtime.     oxyCODONE-acetaminophen 5-325 MG per tablet  Commonly known as:  ROXICET  Take 1 tablet by mouth every 4 (four) hours as needed.     torsemide 20 MG tablet  Commonly known as:  DEMADEX  Take 20 mg by mouth 2 (two) times daily.  Diagnostic Studies: Dg Chest 2 View  07/16/2014   CLINICAL DATA:  Preop right hip surgery  EXAM: CHEST  2 VIEW  COMPARISON:  03/11/2014  FINDINGS: Lungs are essentially clear. No focal consolidation. No pleural effusion or pneumothorax.  Mild cardiomegaly. Postsurgical changes related to prior CABG. Left subclavian ICD.  Visualized osseous structures are within normal limits.  Cholecystectomy clips.  IMPRESSION: No evidence of acute cardiopulmonary disease.   Electronically Signed   By: Julian Hy M.D.   On: 07/16/2014 16:28   Dg Pelvis Portable  07/18/2014   CLINICAL DATA:  Acute onset of right hip pain.  Initial encounter.  EXAM: PORTABLE PELVIS 1-2 VIEWS  COMPARISON:  Right hip CT performed 06/07/2014  FINDINGS: Diffuse heterotopic bone formation is again noted about the right hip, as characterized on CT. The  patient's right acetabular hardware is grossly stable in appearance. There is no evidence of acute fracture. The left hip joint is grossly unremarkable.  The sacroiliac joints are unremarkable in appearance. No definite soft tissue abnormalities are characterized on radiograph, aside from mild vascular calcifications.  IMPRESSION: Diffuse heterotopic bone formation again noted about the right hip, as characterized on prior CT. Right acetabular hardware is grossly stable. No evidence of acute fracture.   Electronically Signed   By: Garald Balding M.D.   On: 07/18/2014 21:36    Disposition: Final discharge disposition not confirmed      Discharge Instructions    Call MD / Call 911    Complete by:  As directed   If you experience chest pain or shortness of breath, CALL 911 and be transported to the hospital emergency room.  If you develope a fever above 101 F, pus (white drainage) or increased drainage or redness at the wound, or calf pain, call your surgeon's office.     Change dressing    Complete by:  As directed   You may change your dressing on day 5, then change the dressing daily with sterile 4 x 4 inch gauze dressing and paper tape.  You may clean the incision with alcohol prior to redressing     Constipation Prevention    Complete by:  As directed   Drink plenty of fluids.  Prune juice may be helpful.  You may use a stool softener, such as Colace (over the counter) 100 mg twice a day.  Use MiraLax (over the counter) for constipation as needed.     Diet - low sodium heart healthy    Complete by:  As directed      Discharge instructions    Complete by:  As directed   Follow up in office with Dr. Mayer Camel in 2 weeks.     Driving restrictions    Complete by:  As directed   No driving for 2 weeks     Increase activity slowly as tolerated    Complete by:  As directed      Patient may shower    Complete by:  As directed   You may shower without a dressing once there is no drainage.  Do not  wash over the wound.  If drainage remains, cover wound with plastic wrap and then shower.           Follow-up Information    Follow up with Kerin Salen, MD In 2 weeks.   Specialty:  Orthopedic Surgery   Contact information:   [place] [address] [phone]        Signed: Hardin Negus,  Paul [name] R 07/20/2014, 11:38 AM

## 2014-07-20 NOTE — Progress Notes (Signed)
Physical Therapy Treatment Patient Details Name: Paul [name]. MRN: [id number] DOB: 1965/05/16 Today's Date: 07/20/2014    History of Present Illness 49 y.o. male s/p EXCISION HETEROTOPIC BONE RIGHT HIP    PT Comments    Patient making good progress with mobility and gait.  Good understanding of hip precautions.  Patient with increased pain and nausea during session today - RN notified.  Follow Up Recommendations  Home health PT;Supervision for mobility/OOB     Equipment Recommendations  Rolling walker with 5" wheels;3in1 (PT)    Recommendations for Other Services       Precautions / Restrictions Precautions Precautions: Posterior Hip Precaution Booklet Issued: Yes (comment) Precaution Comments: Patient able to state 1/3 posterior hip precautions.  Reviewed with patient. Restrictions Weight Bearing Restrictions: Yes RLE Weight Bearing: Weight bearing as tolerated    Mobility  Bed Mobility Overal bed mobility: Needs Assistance Bed Mobility: Rolling;Sidelying to Sit;Sit to Sidelying Rolling: Supervision Sidelying to sit: Supervision     Sit to sidelying: Supervision General bed mobility comments: Patient uses technique of pillow between knees to maintain posterior precautions - no cues needed.  Requires increased time.  Transfers Overall transfer level: Needs assistance Equipment used: Rolling walker (2 wheeled) Transfers: Sit to/from Stand Sit to Stand: Min guard         General transfer comment: Cues for hand placement. Assist for safety/balance.  Ambulation/Gait Ambulation/Gait assistance: Supervision Ambulation Distance (Feet): 200 Feet Assistive device: Rolling walker (2 wheeled) Gait Pattern/deviations: Step-through pattern;Decreased stance time - right;Decreased step length - left;Decreased stride length;Antalgic;Decreased weight shift to right Gait velocity: decreased Gait velocity interpretation: Below normal speed for age/gender General Gait  Details: Patient demonstrates safe use of RW.  Antalgic gait pattern.  Patient became nauseated during gait - provided washcloth.  Patient continued back to room and to bed.  Nausea easing up.  RN notified.   Stairs            Wheelchair Mobility    Modified Rankin (Stroke Patients Only)       Balance     Sitting balance-Leahy Scale: Good       Standing balance-Leahy Scale: Fair                      Cognition Arousal/Alertness: Awake/alert Behavior During Therapy: WFL for tasks assessed/performed Overall Cognitive Status: Within Functional Limits for tasks assessed                      Exercises      General Comments        Pertinent Vitals/Pain Pain Assessment: 0-10 Pain Score: 6  Pain Location: Rt hip Pain Descriptors / Indicators: Aching;Sore Pain Intervention(s): Monitored during session;Premedicated before session;Repositioned    Home Living Family/patient expects to be discharged to:: Private residence Living Arrangements: Spouse/significant other Available Help at Discharge: Family;Available 24 hours/day Type of Home: House Home Access: Level entry   Home Layout: One level Home Equipment: None      Prior Function Level of Independence: Independent      Comments: Works.   PT Goals (current goals can now be found in the care plan section) Acute Rehab PT Goals Patient Stated Goal: Get back to working out Progress towards PT goals: Progressing toward goals    Frequency  Min 5X/week    PT Plan Current plan remains appropriate    Co-evaluation             End of Session  Equipment Utilized During Treatment: Gait belt Activity Tolerance: Patient tolerated treatment well (Limited by nausea) Patient left: in bed;in CPM     Time: [id number] PT Time Calculation (min) (ACUTE ONLY): 24 min  Charges:  $Gait Training: 23-37 mins                    G Codes:      Despina Pole 2014/07/28, 1:54 PM Carita Pian. Sanjuana Kava,  [place] Pager [phone]

## 2014-07-20 NOTE — Progress Notes (Signed)
PATIENT ID: Paul [name].  MRN: [id number]  DOB/AGE:  [birth year] / 49 y.o.  2 Days Post-Op Procedure(s) (LRB): EXCISION HETEROTOPIC BONE RIGHT HIP (Right)    PROGRESS NOTE Subjective: Patient is alert, oriented, no Nausea, no Vomiting, yes passing gas, no Bowel Movement. Taking PO with small bites. Denies SOB, Chest or Calf Pain. Using Incentive Spirometer, PAS in place. Ambulate WBAT Patient reports pain as 7 on 0-10 scale  .    Objective: Vital signs in last 24 hours: Filed Vitals:   07/19/14 1442 07/19/14 1700 07/19/14 2100 07/20/14 0500  BP: 138/91 152/90 103/59 102/67  Pulse: 106 117 91 92  Temp: 99.9 F (37.7 C) 99.7 F (37.6 C) 98.9 F (37.2 C) 99.2 F (37.3 C)  TempSrc: Oral Oral    Resp:  16 16 16   Height:      Weight:      SpO2:  100% 96% 100%      Intake/Output from previous day: I/O last 3 completed shifts: In: 1000 [P.O.:600; I.V.:400] Out: 4070 [Urine:3970; Drains:100]   Intake/Output this shift:     LABORATORY DATA:  Recent Labs  07/19/14 1922 07/19/14 2119 07/20/14 0632  GLUCAP 114* 170* 142*    Examination: Neurologically intact Neurovascular intact Sensation intact distally Intact pulses distally Dorsiflexion/Plantar flexion intact Incision: scant drainage No cellulitis present Compartment soft} XR AP&Lat of hip shows well placed\fixed THA  Assessment:   2 Days Post-Op Procedure(s) (LRB): EXCISION HETEROTOPIC BONE RIGHT HIP (Right) ADDITIONAL DIAGNOSIS:  Expected Acute Blood Loss Anemia, Diabetes, congestive heart failure chronic with ejection fraction of 30%, HTN.  Plan: PT/OT WBAT  DVT Prophylaxis: SCDx72 hrs, ASA 325 mg BID x 2 weeks  DISCHARGE PLAN: Home, probably tomorrow after therapy.  DISCHARGE NEEDS: HHPT and Walker

## 2014-07-22 ENCOUNTER — Encounter: Payer: Self-pay | Admitting: Radiation Oncology

## 2014-07-22 NOTE — Progress Notes (Signed)
[facility] End of Treatment Note  Name:Santo L Arva Chafe.  Date: 07/22/2014 [id number] DOB:[birth year]   Status:outpatient    CC: ASRES,ALEHEGN, MD  Dr. Frederik Pear  REFERRING PHYSICIAN:  Dr. Frederik Pear    DIAGNOSIS:  Heterotopic bone formation, right hip  INDICATION FOR TREATMENT: Preventative/prophylactic   TREATMENT DATE:  07/19/2014                         SITE/DOSE:   Right hip 700 cGy in a single fraction                         BEAMS/ENERGY:   RAO and LPO fields with mixed 6 MV/10 MV photons                NARRATIVE:     Mr. Durocher tolerated his treatment well.                       PLAN: Follow-up through Dr. Frederik Pear. Patient instructed to call if questions or worsening complaints in interim.

## 2014-07-23 ENCOUNTER — Encounter (HOSPITAL_COMMUNITY): Payer: Self-pay | Admitting: Orthopedic Surgery

## 2014-07-23 DIAGNOSIS — Z95810 Presence of automatic (implantable) cardiac defibrillator: Secondary | ICD-10-CM | POA: Diagnosis not present

## 2014-07-23 DIAGNOSIS — I2510 Atherosclerotic heart disease of native coronary artery without angina pectoris: Secondary | ICD-10-CM | POA: Diagnosis not present

## 2014-07-23 DIAGNOSIS — I509 Heart failure, unspecified: Secondary | ICD-10-CM | POA: Diagnosis not present

## 2014-07-23 DIAGNOSIS — Z8673 Personal history of transient ischemic attack (TIA), and cerebral infarction without residual deficits: Secondary | ICD-10-CM | POA: Diagnosis not present

## 2014-07-23 DIAGNOSIS — E119 Type 2 diabetes mellitus without complications: Secondary | ICD-10-CM | POA: Diagnosis not present

## 2014-07-23 DIAGNOSIS — Z794 Long term (current) use of insulin: Secondary | ICD-10-CM | POA: Diagnosis not present

## 2014-07-23 DIAGNOSIS — I10 Essential (primary) hypertension: Secondary | ICD-10-CM | POA: Diagnosis not present

## 2014-07-23 DIAGNOSIS — Z4789 Encounter for other orthopedic aftercare: Secondary | ICD-10-CM | POA: Diagnosis not present

## 2014-07-23 DIAGNOSIS — M5137 Other intervertebral disc degeneration, lumbosacral region: Secondary | ICD-10-CM | POA: Diagnosis not present

## 2014-07-25 DIAGNOSIS — I10 Essential (primary) hypertension: Secondary | ICD-10-CM | POA: Diagnosis not present

## 2014-07-25 DIAGNOSIS — M5137 Other intervertebral disc degeneration, lumbosacral region: Secondary | ICD-10-CM | POA: Diagnosis not present

## 2014-07-25 DIAGNOSIS — Z4789 Encounter for other orthopedic aftercare: Secondary | ICD-10-CM | POA: Diagnosis not present

## 2014-07-25 DIAGNOSIS — E119 Type 2 diabetes mellitus without complications: Secondary | ICD-10-CM | POA: Diagnosis not present

## 2014-07-25 DIAGNOSIS — I2510 Atherosclerotic heart disease of native coronary artery without angina pectoris: Secondary | ICD-10-CM | POA: Diagnosis not present

## 2014-07-25 DIAGNOSIS — I509 Heart failure, unspecified: Secondary | ICD-10-CM | POA: Diagnosis not present

## 2014-07-27 DIAGNOSIS — Z4789 Encounter for other orthopedic aftercare: Secondary | ICD-10-CM | POA: Diagnosis not present

## 2014-07-27 DIAGNOSIS — I10 Essential (primary) hypertension: Secondary | ICD-10-CM | POA: Diagnosis not present

## 2014-07-27 DIAGNOSIS — M5137 Other intervertebral disc degeneration, lumbosacral region: Secondary | ICD-10-CM | POA: Diagnosis not present

## 2014-07-27 DIAGNOSIS — E119 Type 2 diabetes mellitus without complications: Secondary | ICD-10-CM | POA: Diagnosis not present

## 2014-07-27 DIAGNOSIS — I2510 Atherosclerotic heart disease of native coronary artery without angina pectoris: Secondary | ICD-10-CM | POA: Diagnosis not present

## 2014-07-27 DIAGNOSIS — I509 Heart failure, unspecified: Secondary | ICD-10-CM | POA: Diagnosis not present

## 2014-07-31 DIAGNOSIS — M5137 Other intervertebral disc degeneration, lumbosacral region: Secondary | ICD-10-CM | POA: Diagnosis not present

## 2014-07-31 DIAGNOSIS — I509 Heart failure, unspecified: Secondary | ICD-10-CM | POA: Diagnosis not present

## 2014-07-31 DIAGNOSIS — E119 Type 2 diabetes mellitus without complications: Secondary | ICD-10-CM | POA: Diagnosis not present

## 2014-07-31 DIAGNOSIS — I10 Essential (primary) hypertension: Secondary | ICD-10-CM | POA: Diagnosis not present

## 2014-07-31 DIAGNOSIS — I2510 Atherosclerotic heart disease of native coronary artery without angina pectoris: Secondary | ICD-10-CM | POA: Diagnosis not present

## 2014-07-31 DIAGNOSIS — Z4789 Encounter for other orthopedic aftercare: Secondary | ICD-10-CM | POA: Diagnosis not present

## 2014-08-07 DIAGNOSIS — M948X9 Other specified disorders of cartilage, unspecified sites: Secondary | ICD-10-CM | POA: Diagnosis not present

## 2014-08-07 DIAGNOSIS — E11621 Type 2 diabetes mellitus with foot ulcer: Secondary | ICD-10-CM | POA: Diagnosis not present

## 2014-08-07 DIAGNOSIS — L89893 Pressure ulcer of other site, stage 3: Secondary | ICD-10-CM | POA: Diagnosis not present

## 2014-08-07 DIAGNOSIS — Z9889 Other specified postprocedural states: Secondary | ICD-10-CM | POA: Diagnosis not present

## 2014-08-08 DIAGNOSIS — I10 Essential (primary) hypertension: Secondary | ICD-10-CM | POA: Diagnosis not present

## 2014-08-08 DIAGNOSIS — E119 Type 2 diabetes mellitus without complications: Secondary | ICD-10-CM | POA: Diagnosis not present

## 2014-08-08 DIAGNOSIS — Z4789 Encounter for other orthopedic aftercare: Secondary | ICD-10-CM | POA: Diagnosis not present

## 2014-08-08 DIAGNOSIS — I509 Heart failure, unspecified: Secondary | ICD-10-CM | POA: Diagnosis not present

## 2014-08-08 DIAGNOSIS — I2510 Atherosclerotic heart disease of native coronary artery without angina pectoris: Secondary | ICD-10-CM | POA: Diagnosis not present

## 2014-08-08 DIAGNOSIS — M5137 Other intervertebral disc degeneration, lumbosacral region: Secondary | ICD-10-CM | POA: Diagnosis not present

## 2014-10-14 DIAGNOSIS — Z951 Presence of aortocoronary bypass graft: Secondary | ICD-10-CM | POA: Diagnosis not present

## 2014-10-14 DIAGNOSIS — Z7982 Long term (current) use of aspirin: Secondary | ICD-10-CM | POA: Diagnosis not present

## 2014-10-14 DIAGNOSIS — R0602 Shortness of breath: Secondary | ICD-10-CM | POA: Diagnosis not present

## 2014-10-14 DIAGNOSIS — Z794 Long term (current) use of insulin: Secondary | ICD-10-CM | POA: Diagnosis not present

## 2014-10-14 DIAGNOSIS — Z79899 Other long term (current) drug therapy: Secondary | ICD-10-CM | POA: Diagnosis not present

## 2014-10-14 DIAGNOSIS — I509 Heart failure, unspecified: Secondary | ICD-10-CM | POA: Diagnosis not present

## 2014-10-14 DIAGNOSIS — E119 Type 2 diabetes mellitus without complications: Secondary | ICD-10-CM | POA: Diagnosis not present

## 2014-10-14 DIAGNOSIS — I10 Essential (primary) hypertension: Secondary | ICD-10-CM | POA: Diagnosis not present

## 2014-10-24 DIAGNOSIS — I5042 Chronic combined systolic (congestive) and diastolic (congestive) heart failure: Secondary | ICD-10-CM | POA: Diagnosis not present

## 2014-10-24 DIAGNOSIS — I2510 Atherosclerotic heart disease of native coronary artery without angina pectoris: Secondary | ICD-10-CM | POA: Diagnosis not present

## 2014-10-24 DIAGNOSIS — I429 Cardiomyopathy, unspecified: Secondary | ICD-10-CM | POA: Diagnosis not present

## 2014-10-24 DIAGNOSIS — G4733 Obstructive sleep apnea (adult) (pediatric): Secondary | ICD-10-CM | POA: Diagnosis not present

## 2014-12-04 DIAGNOSIS — N5201 Erectile dysfunction due to arterial insufficiency: Secondary | ICD-10-CM | POA: Diagnosis not present

## 2015-02-14 DIAGNOSIS — I2510 Atherosclerotic heart disease of native coronary artery without angina pectoris: Secondary | ICD-10-CM | POA: Diagnosis not present

## 2015-02-14 DIAGNOSIS — Z79899 Other long term (current) drug therapy: Secondary | ICD-10-CM | POA: Diagnosis not present

## 2015-02-14 DIAGNOSIS — E871 Hypo-osmolality and hyponatremia: Secondary | ICD-10-CM | POA: Diagnosis not present

## 2015-02-14 DIAGNOSIS — R0600 Dyspnea, unspecified: Secondary | ICD-10-CM | POA: Diagnosis not present

## 2015-02-14 DIAGNOSIS — N189 Chronic kidney disease, unspecified: Secondary | ICD-10-CM | POA: Diagnosis not present

## 2015-02-14 DIAGNOSIS — I509 Heart failure, unspecified: Secondary | ICD-10-CM | POA: Diagnosis not present

## 2015-02-14 DIAGNOSIS — G4733 Obstructive sleep apnea (adult) (pediatric): Secondary | ICD-10-CM | POA: Diagnosis not present

## 2015-02-14 DIAGNOSIS — N179 Acute kidney failure, unspecified: Secondary | ICD-10-CM | POA: Diagnosis not present

## 2015-02-14 DIAGNOSIS — I5043 Acute on chronic combined systolic (congestive) and diastolic (congestive) heart failure: Secondary | ICD-10-CM | POA: Diagnosis not present

## 2015-02-14 DIAGNOSIS — Z95810 Presence of automatic (implantable) cardiac defibrillator: Secondary | ICD-10-CM | POA: Diagnosis not present

## 2015-02-14 DIAGNOSIS — I081 Rheumatic disorders of both mitral and tricuspid valves: Secondary | ICD-10-CM | POA: Diagnosis not present

## 2015-02-14 DIAGNOSIS — D638 Anemia in other chronic diseases classified elsewhere: Secondary | ICD-10-CM | POA: Diagnosis present

## 2015-02-14 DIAGNOSIS — R0602 Shortness of breath: Secondary | ICD-10-CM | POA: Diagnosis not present

## 2015-02-14 DIAGNOSIS — I129 Hypertensive chronic kidney disease with stage 1 through stage 4 chronic kidney disease, or unspecified chronic kidney disease: Secondary | ICD-10-CM | POA: Diagnosis present

## 2015-02-14 DIAGNOSIS — E119 Type 2 diabetes mellitus without complications: Secondary | ICD-10-CM | POA: Diagnosis not present

## 2015-02-14 DIAGNOSIS — Z794 Long term (current) use of insulin: Secondary | ICD-10-CM | POA: Diagnosis not present

## 2015-03-22 DIAGNOSIS — S6991XA Unspecified injury of right wrist, hand and finger(s), initial encounter: Secondary | ICD-10-CM | POA: Diagnosis not present

## 2015-03-22 DIAGNOSIS — E119 Type 2 diabetes mellitus without complications: Secondary | ICD-10-CM | POA: Diagnosis not present

## 2015-03-22 DIAGNOSIS — I110 Hypertensive heart disease with heart failure: Secondary | ICD-10-CM | POA: Diagnosis not present

## 2015-03-22 DIAGNOSIS — S63501A Unspecified sprain of right wrist, initial encounter: Secondary | ICD-10-CM | POA: Diagnosis not present

## 2015-03-22 DIAGNOSIS — M25521 Pain in right elbow: Secondary | ICD-10-CM | POA: Diagnosis not present

## 2015-03-22 DIAGNOSIS — S50311A Abrasion of right elbow, initial encounter: Secondary | ICD-10-CM | POA: Diagnosis not present

## 2015-03-22 DIAGNOSIS — M79641 Pain in right hand: Secondary | ICD-10-CM | POA: Diagnosis not present

## 2015-03-22 DIAGNOSIS — I2510 Atherosclerotic heart disease of native coronary artery without angina pectoris: Secondary | ICD-10-CM | POA: Diagnosis not present

## 2015-03-22 DIAGNOSIS — I509 Heart failure, unspecified: Secondary | ICD-10-CM | POA: Diagnosis not present

## 2015-03-22 DIAGNOSIS — S6391XA Sprain of unspecified part of right wrist and hand, initial encounter: Secondary | ICD-10-CM | POA: Diagnosis not present

## 2015-03-22 DIAGNOSIS — Z794 Long term (current) use of insulin: Secondary | ICD-10-CM | POA: Diagnosis not present

## 2015-03-22 DIAGNOSIS — Z79899 Other long term (current) drug therapy: Secondary | ICD-10-CM | POA: Diagnosis not present

## 2015-03-22 DIAGNOSIS — S80211A Abrasion, right knee, initial encounter: Secondary | ICD-10-CM | POA: Diagnosis not present

## 2015-03-25 DIAGNOSIS — I509 Heart failure, unspecified: Secondary | ICD-10-CM | POA: Diagnosis not present

## 2015-03-25 DIAGNOSIS — Z886 Allergy status to analgesic agent status: Secondary | ICD-10-CM | POA: Diagnosis not present

## 2015-03-25 DIAGNOSIS — S6991XA Unspecified injury of right wrist, hand and finger(s), initial encounter: Secondary | ICD-10-CM | POA: Diagnosis not present

## 2015-03-25 DIAGNOSIS — Z951 Presence of aortocoronary bypass graft: Secondary | ICD-10-CM | POA: Diagnosis not present

## 2015-03-25 DIAGNOSIS — R2231 Localized swelling, mass and lump, right upper limb: Secondary | ICD-10-CM | POA: Diagnosis not present

## 2015-03-25 DIAGNOSIS — E119 Type 2 diabetes mellitus without complications: Secondary | ICD-10-CM | POA: Diagnosis not present

## 2015-03-25 DIAGNOSIS — Z79899 Other long term (current) drug therapy: Secondary | ICD-10-CM | POA: Diagnosis not present

## 2015-03-25 DIAGNOSIS — Z794 Long term (current) use of insulin: Secondary | ICD-10-CM | POA: Diagnosis not present

## 2015-03-25 DIAGNOSIS — R600 Localized edema: Secondary | ICD-10-CM | POA: Diagnosis not present

## 2015-03-25 DIAGNOSIS — M10041 Idiopathic gout, right hand: Secondary | ICD-10-CM | POA: Diagnosis not present

## 2015-04-24 DIAGNOSIS — I2510 Atherosclerotic heart disease of native coronary artery without angina pectoris: Secondary | ICD-10-CM | POA: Diagnosis not present

## 2015-04-24 DIAGNOSIS — M79641 Pain in right hand: Secondary | ICD-10-CM | POA: Diagnosis not present

## 2015-04-24 DIAGNOSIS — G4733 Obstructive sleep apnea (adult) (pediatric): Secondary | ICD-10-CM | POA: Diagnosis not present

## 2015-04-24 DIAGNOSIS — Z794 Long term (current) use of insulin: Secondary | ICD-10-CM | POA: Diagnosis not present

## 2015-04-24 DIAGNOSIS — M7989 Other specified soft tissue disorders: Secondary | ICD-10-CM | POA: Diagnosis not present

## 2015-04-24 DIAGNOSIS — M109 Gout, unspecified: Secondary | ICD-10-CM | POA: Diagnosis not present

## 2015-04-24 DIAGNOSIS — I429 Cardiomyopathy, unspecified: Secondary | ICD-10-CM | POA: Diagnosis not present

## 2015-04-24 DIAGNOSIS — I10 Essential (primary) hypertension: Secondary | ICD-10-CM | POA: Diagnosis not present

## 2015-04-24 DIAGNOSIS — M1000 Idiopathic gout, unspecified site: Secondary | ICD-10-CM | POA: Diagnosis not present

## 2015-04-24 DIAGNOSIS — I509 Heart failure, unspecified: Secondary | ICD-10-CM | POA: Diagnosis not present

## 2015-04-24 DIAGNOSIS — E119 Type 2 diabetes mellitus without complications: Secondary | ICD-10-CM | POA: Diagnosis not present

## 2015-05-17 DIAGNOSIS — Z79899 Other long term (current) drug therapy: Secondary | ICD-10-CM | POA: Diagnosis not present

## 2015-05-17 DIAGNOSIS — I509 Heart failure, unspecified: Secondary | ICD-10-CM | POA: Diagnosis not present

## 2015-05-17 DIAGNOSIS — Z794 Long term (current) use of insulin: Secondary | ICD-10-CM | POA: Diagnosis not present

## 2015-05-17 DIAGNOSIS — Z7984 Long term (current) use of oral hypoglycemic drugs: Secondary | ICD-10-CM | POA: Diagnosis not present

## 2015-05-17 DIAGNOSIS — I110 Hypertensive heart disease with heart failure: Secondary | ICD-10-CM | POA: Diagnosis not present

## 2015-05-17 DIAGNOSIS — M1A0410 Idiopathic chronic gout, right hand, without tophus (tophi): Secondary | ICD-10-CM | POA: Diagnosis not present

## 2015-05-17 DIAGNOSIS — I2510 Atherosclerotic heart disease of native coronary artery without angina pectoris: Secondary | ICD-10-CM | POA: Diagnosis not present

## 2015-05-17 DIAGNOSIS — E119 Type 2 diabetes mellitus without complications: Secondary | ICD-10-CM | POA: Diagnosis not present

## 2015-05-17 DIAGNOSIS — M79641 Pain in right hand: Secondary | ICD-10-CM | POA: Diagnosis not present

## 2015-06-08 DIAGNOSIS — G4733 Obstructive sleep apnea (adult) (pediatric): Secondary | ICD-10-CM | POA: Diagnosis not present

## 2015-06-08 DIAGNOSIS — Z794 Long term (current) use of insulin: Secondary | ICD-10-CM | POA: Diagnosis not present

## 2015-06-08 DIAGNOSIS — M79642 Pain in left hand: Secondary | ICD-10-CM | POA: Diagnosis not present

## 2015-06-08 DIAGNOSIS — E119 Type 2 diabetes mellitus without complications: Secondary | ICD-10-CM | POA: Diagnosis not present

## 2015-06-08 DIAGNOSIS — I110 Hypertensive heart disease with heart failure: Secondary | ICD-10-CM | POA: Diagnosis not present

## 2015-06-08 DIAGNOSIS — I509 Heart failure, unspecified: Secondary | ICD-10-CM | POA: Diagnosis not present

## 2015-06-08 DIAGNOSIS — I2510 Atherosclerotic heart disease of native coronary artery without angina pectoris: Secondary | ICD-10-CM | POA: Diagnosis not present

## 2015-08-02 DIAGNOSIS — G4733 Obstructive sleep apnea (adult) (pediatric): Secondary | ICD-10-CM | POA: Diagnosis not present

## 2015-08-02 DIAGNOSIS — E162 Hypoglycemia, unspecified: Secondary | ICD-10-CM | POA: Diagnosis not present

## 2015-08-02 DIAGNOSIS — T82538A Leakage of other cardiac and vascular devices and implants, initial encounter: Secondary | ICD-10-CM | POA: Diagnosis not present

## 2015-08-02 DIAGNOSIS — I2510 Atherosclerotic heart disease of native coronary artery without angina pectoris: Secondary | ICD-10-CM | POA: Diagnosis not present

## 2015-08-02 DIAGNOSIS — T82198A Other mechanical complication of other cardiac electronic device, initial encounter: Secondary | ICD-10-CM | POA: Diagnosis not present

## 2015-08-02 DIAGNOSIS — M7989 Other specified soft tissue disorders: Secondary | ICD-10-CM | POA: Diagnosis not present

## 2015-08-02 DIAGNOSIS — E119 Type 2 diabetes mellitus without complications: Secondary | ICD-10-CM | POA: Diagnosis not present

## 2015-08-02 DIAGNOSIS — Z886 Allergy status to analgesic agent status: Secondary | ICD-10-CM | POA: Diagnosis not present

## 2015-08-02 DIAGNOSIS — Z794 Long term (current) use of insulin: Secondary | ICD-10-CM | POA: Diagnosis not present

## 2015-08-02 DIAGNOSIS — Z8249 Family history of ischemic heart disease and other diseases of the circulatory system: Secondary | ICD-10-CM | POA: Diagnosis not present

## 2015-08-02 DIAGNOSIS — Z951 Presence of aortocoronary bypass graft: Secondary | ICD-10-CM | POA: Diagnosis not present

## 2015-08-02 DIAGNOSIS — I10 Essential (primary) hypertension: Secondary | ICD-10-CM | POA: Diagnosis not present

## 2015-08-02 DIAGNOSIS — Z7984 Long term (current) use of oral hypoglycemic drugs: Secondary | ICD-10-CM | POA: Diagnosis not present

## 2015-08-02 DIAGNOSIS — I429 Cardiomyopathy, unspecified: Secondary | ICD-10-CM | POA: Diagnosis not present

## 2015-08-02 DIAGNOSIS — Z79899 Other long term (current) drug therapy: Secondary | ICD-10-CM | POA: Diagnosis not present

## 2015-08-02 DIAGNOSIS — E161 Other hypoglycemia: Secondary | ICD-10-CM | POA: Diagnosis not present

## 2015-08-02 DIAGNOSIS — I517 Cardiomegaly: Secondary | ICD-10-CM | POA: Diagnosis not present

## 2015-08-09 DIAGNOSIS — M25541 Pain in joints of right hand: Secondary | ICD-10-CM | POA: Diagnosis not present

## 2015-08-09 DIAGNOSIS — E119 Type 2 diabetes mellitus without complications: Secondary | ICD-10-CM | POA: Diagnosis not present

## 2015-08-09 DIAGNOSIS — Z7984 Long term (current) use of oral hypoglycemic drugs: Secondary | ICD-10-CM | POA: Diagnosis not present

## 2015-08-09 DIAGNOSIS — M25521 Pain in right elbow: Secondary | ICD-10-CM | POA: Diagnosis not present

## 2015-08-09 DIAGNOSIS — I429 Cardiomyopathy, unspecified: Secondary | ICD-10-CM | POA: Diagnosis not present

## 2015-08-09 DIAGNOSIS — I2510 Atherosclerotic heart disease of native coronary artery without angina pectoris: Secondary | ICD-10-CM | POA: Diagnosis not present

## 2015-08-09 DIAGNOSIS — G4733 Obstructive sleep apnea (adult) (pediatric): Secondary | ICD-10-CM | POA: Diagnosis not present

## 2015-08-09 DIAGNOSIS — Z79899 Other long term (current) drug therapy: Secondary | ICD-10-CM | POA: Diagnosis not present

## 2015-08-09 DIAGNOSIS — I10 Essential (primary) hypertension: Secondary | ICD-10-CM | POA: Diagnosis not present

## 2015-08-09 DIAGNOSIS — M2550 Pain in unspecified joint: Secondary | ICD-10-CM | POA: Diagnosis not present

## 2015-08-09 DIAGNOSIS — Z794 Long term (current) use of insulin: Secondary | ICD-10-CM | POA: Diagnosis not present

## 2015-08-09 DIAGNOSIS — Z886 Allergy status to analgesic agent status: Secondary | ICD-10-CM | POA: Diagnosis not present

## 2015-09-09 DIAGNOSIS — G4733 Obstructive sleep apnea (adult) (pediatric): Secondary | ICD-10-CM | POA: Diagnosis not present

## 2015-09-09 DIAGNOSIS — E119 Type 2 diabetes mellitus without complications: Secondary | ICD-10-CM | POA: Diagnosis not present

## 2015-09-09 DIAGNOSIS — R609 Edema, unspecified: Secondary | ICD-10-CM | POA: Diagnosis not present

## 2015-09-09 DIAGNOSIS — R600 Localized edema: Secondary | ICD-10-CM | POA: Diagnosis not present

## 2015-09-09 DIAGNOSIS — R05 Cough: Secondary | ICD-10-CM | POA: Diagnosis not present

## 2015-09-09 DIAGNOSIS — I509 Heart failure, unspecified: Secondary | ICD-10-CM | POA: Diagnosis not present

## 2015-09-09 DIAGNOSIS — Z95810 Presence of automatic (implantable) cardiac defibrillator: Secondary | ICD-10-CM | POA: Diagnosis not present

## 2015-09-09 DIAGNOSIS — R0602 Shortness of breath: Secondary | ICD-10-CM | POA: Diagnosis not present

## 2015-09-09 DIAGNOSIS — I2510 Atherosclerotic heart disease of native coronary artery without angina pectoris: Secondary | ICD-10-CM | POA: Diagnosis not present

## 2015-09-09 DIAGNOSIS — I110 Hypertensive heart disease with heart failure: Secondary | ICD-10-CM | POA: Diagnosis not present

## 2015-11-01 DIAGNOSIS — E119 Type 2 diabetes mellitus without complications: Secondary | ICD-10-CM | POA: Diagnosis not present

## 2015-11-01 DIAGNOSIS — R609 Edema, unspecified: Secondary | ICD-10-CM | POA: Diagnosis not present

## 2015-11-01 DIAGNOSIS — R109 Unspecified abdominal pain: Secondary | ICD-10-CM | POA: Diagnosis not present

## 2015-11-01 DIAGNOSIS — Z79899 Other long term (current) drug therapy: Secondary | ICD-10-CM | POA: Diagnosis not present

## 2015-11-01 DIAGNOSIS — Z7984 Long term (current) use of oral hypoglycemic drugs: Secondary | ICD-10-CM | POA: Diagnosis not present

## 2015-11-01 DIAGNOSIS — I509 Heart failure, unspecified: Secondary | ICD-10-CM | POA: Diagnosis not present

## 2015-11-01 DIAGNOSIS — R0602 Shortness of breath: Secondary | ICD-10-CM | POA: Diagnosis not present

## 2015-11-01 DIAGNOSIS — Z794 Long term (current) use of insulin: Secondary | ICD-10-CM | POA: Diagnosis not present

## 2015-11-01 DIAGNOSIS — I2510 Atherosclerotic heart disease of native coronary artery without angina pectoris: Secondary | ICD-10-CM | POA: Diagnosis not present

## 2015-11-01 DIAGNOSIS — I110 Hypertensive heart disease with heart failure: Secondary | ICD-10-CM | POA: Diagnosis not present

## 2015-11-11 DIAGNOSIS — E669 Obesity, unspecified: Secondary | ICD-10-CM | POA: Diagnosis present

## 2015-11-11 DIAGNOSIS — I10 Essential (primary) hypertension: Secondary | ICD-10-CM | POA: Diagnosis not present

## 2015-11-11 DIAGNOSIS — R079 Chest pain, unspecified: Secondary | ICD-10-CM | POA: Diagnosis not present

## 2015-11-11 DIAGNOSIS — Z951 Presence of aortocoronary bypass graft: Secondary | ICD-10-CM | POA: Diagnosis not present

## 2015-11-11 DIAGNOSIS — G4733 Obstructive sleep apnea (adult) (pediatric): Secondary | ICD-10-CM | POA: Diagnosis not present

## 2015-11-11 DIAGNOSIS — I5043 Acute on chronic combined systolic (congestive) and diastolic (congestive) heart failure: Secondary | ICD-10-CM | POA: Diagnosis not present

## 2015-11-11 DIAGNOSIS — E1122 Type 2 diabetes mellitus with diabetic chronic kidney disease: Secondary | ICD-10-CM | POA: Diagnosis not present

## 2015-11-11 DIAGNOSIS — I517 Cardiomegaly: Secondary | ICD-10-CM | POA: Diagnosis not present

## 2015-11-11 DIAGNOSIS — Z6841 Body Mass Index (BMI) 40.0 and over, adult: Secondary | ICD-10-CM | POA: Diagnosis not present

## 2015-11-11 DIAGNOSIS — I130 Hypertensive heart and chronic kidney disease with heart failure and stage 1 through stage 4 chronic kidney disease, or unspecified chronic kidney disease: Secondary | ICD-10-CM | POA: Diagnosis not present

## 2015-11-11 DIAGNOSIS — R0602 Shortness of breath: Secondary | ICD-10-CM | POA: Diagnosis not present

## 2015-11-11 DIAGNOSIS — Z79899 Other long term (current) drug therapy: Secondary | ICD-10-CM | POA: Diagnosis not present

## 2015-11-11 DIAGNOSIS — E119 Type 2 diabetes mellitus without complications: Secondary | ICD-10-CM | POA: Diagnosis not present

## 2015-11-11 DIAGNOSIS — M25562 Pain in left knee: Secondary | ICD-10-CM | POA: Diagnosis not present

## 2015-11-11 DIAGNOSIS — I081 Rheumatic disorders of both mitral and tricuspid valves: Secondary | ICD-10-CM | POA: Diagnosis not present

## 2015-11-11 DIAGNOSIS — R601 Generalized edema: Secondary | ICD-10-CM | POA: Diagnosis not present

## 2015-11-11 DIAGNOSIS — Z794 Long term (current) use of insulin: Secondary | ICD-10-CM | POA: Diagnosis not present

## 2015-11-11 DIAGNOSIS — I2510 Atherosclerotic heart disease of native coronary artery without angina pectoris: Secondary | ICD-10-CM | POA: Diagnosis not present

## 2015-11-11 DIAGNOSIS — I509 Heart failure, unspecified: Secondary | ICD-10-CM | POA: Diagnosis not present

## 2015-11-11 DIAGNOSIS — Z95810 Presence of automatic (implantable) cardiac defibrillator: Secondary | ICD-10-CM | POA: Diagnosis not present

## 2015-11-11 DIAGNOSIS — R05 Cough: Secondary | ICD-10-CM | POA: Diagnosis not present

## 2015-11-11 DIAGNOSIS — N189 Chronic kidney disease, unspecified: Secondary | ICD-10-CM | POA: Diagnosis not present

## 2015-11-11 DIAGNOSIS — I429 Cardiomyopathy, unspecified: Secondary | ICD-10-CM | POA: Diagnosis not present

## 2015-11-11 DIAGNOSIS — N179 Acute kidney failure, unspecified: Secondary | ICD-10-CM | POA: Diagnosis present

## 2015-11-18 DIAGNOSIS — C189 Malignant neoplasm of colon, unspecified: Secondary | ICD-10-CM | POA: Diagnosis not present

## 2015-12-16 DIAGNOSIS — Z79899 Other long term (current) drug therapy: Secondary | ICD-10-CM | POA: Diagnosis not present

## 2015-12-16 DIAGNOSIS — M79641 Pain in right hand: Secondary | ICD-10-CM | POA: Diagnosis not present

## 2015-12-16 DIAGNOSIS — Z951 Presence of aortocoronary bypass graft: Secondary | ICD-10-CM | POA: Diagnosis not present

## 2015-12-16 DIAGNOSIS — M13842 Other specified arthritis, left hand: Secondary | ICD-10-CM | POA: Diagnosis not present

## 2015-12-16 DIAGNOSIS — E119 Type 2 diabetes mellitus without complications: Secondary | ICD-10-CM | POA: Diagnosis not present

## 2015-12-16 DIAGNOSIS — I509 Heart failure, unspecified: Secondary | ICD-10-CM | POA: Diagnosis not present

## 2015-12-16 DIAGNOSIS — Z886 Allergy status to analgesic agent status: Secondary | ICD-10-CM | POA: Diagnosis not present

## 2015-12-16 DIAGNOSIS — Z794 Long term (current) use of insulin: Secondary | ICD-10-CM | POA: Diagnosis not present

## 2015-12-16 DIAGNOSIS — Z9049 Acquired absence of other specified parts of digestive tract: Secondary | ICD-10-CM | POA: Diagnosis not present

## 2015-12-16 DIAGNOSIS — M25541 Pain in joints of right hand: Secondary | ICD-10-CM | POA: Diagnosis not present

## 2015-12-16 DIAGNOSIS — M1990 Unspecified osteoarthritis, unspecified site: Secondary | ICD-10-CM | POA: Diagnosis not present

## 2016-01-07 DIAGNOSIS — Z95810 Presence of automatic (implantable) cardiac defibrillator: Secondary | ICD-10-CM | POA: Diagnosis not present

## 2016-01-07 DIAGNOSIS — D649 Anemia, unspecified: Secondary | ICD-10-CM | POA: Diagnosis not present

## 2016-01-07 DIAGNOSIS — I429 Cardiomyopathy, unspecified: Secondary | ICD-10-CM | POA: Diagnosis not present

## 2016-01-07 DIAGNOSIS — I2510 Atherosclerotic heart disease of native coronary artery without angina pectoris: Secondary | ICD-10-CM | POA: Diagnosis not present

## 2016-01-07 DIAGNOSIS — Z951 Presence of aortocoronary bypass graft: Secondary | ICD-10-CM | POA: Diagnosis not present

## 2016-01-07 DIAGNOSIS — E1122 Type 2 diabetes mellitus with diabetic chronic kidney disease: Secondary | ICD-10-CM | POA: Diagnosis not present

## 2016-01-07 DIAGNOSIS — I5042 Chronic combined systolic (congestive) and diastolic (congestive) heart failure: Secondary | ICD-10-CM | POA: Diagnosis not present

## 2016-01-07 DIAGNOSIS — G4733 Obstructive sleep apnea (adult) (pediatric): Secondary | ICD-10-CM | POA: Diagnosis not present

## 2016-01-07 DIAGNOSIS — Z794 Long term (current) use of insulin: Secondary | ICD-10-CM | POA: Diagnosis not present

## 2016-01-25 ENCOUNTER — Emergency Department
Admission: EM | Admit: 2016-01-25 | Discharge: 2016-01-25 | Disposition: A | Discharge status: Home / Self Care | Payer: Medicare Other | Attending: Emergency Medicine | Admitting: Emergency Medicine

## 2016-01-25 DIAGNOSIS — Z79899 Other long term (current) drug therapy: Secondary | ICD-10-CM | POA: Diagnosis not present

## 2016-01-25 DIAGNOSIS — Z95810 Presence of automatic (implantable) cardiac defibrillator: Secondary | ICD-10-CM | POA: Diagnosis not present

## 2016-01-25 DIAGNOSIS — I2510 Atherosclerotic heart disease of native coronary artery without angina pectoris: Secondary | ICD-10-CM | POA: Diagnosis not present

## 2016-01-25 DIAGNOSIS — Z8673 Personal history of transient ischemic attack (TIA), and cerebral infarction without residual deficits: Secondary | ICD-10-CM | POA: Diagnosis not present

## 2016-01-25 DIAGNOSIS — I110 Hypertensive heart disease with heart failure: Secondary | ICD-10-CM | POA: Diagnosis not present

## 2016-01-25 DIAGNOSIS — Z7982 Long term (current) use of aspirin: Secondary | ICD-10-CM | POA: Insufficient documentation

## 2016-01-25 DIAGNOSIS — I509 Heart failure, unspecified: Secondary | ICD-10-CM | POA: Diagnosis not present

## 2016-01-25 DIAGNOSIS — M109 Gout, unspecified: Secondary | ICD-10-CM | POA: Diagnosis not present

## 2016-01-25 DIAGNOSIS — Z794 Long term (current) use of insulin: Secondary | ICD-10-CM | POA: Insufficient documentation

## 2016-01-25 DIAGNOSIS — E119 Type 2 diabetes mellitus without complications: Secondary | ICD-10-CM | POA: Insufficient documentation

## 2016-01-25 DIAGNOSIS — M10041 Idiopathic gout, right hand: Secondary | ICD-10-CM | POA: Insufficient documentation

## 2016-01-25 DIAGNOSIS — R2231 Localized swelling, mass and lump, right upper limb: Secondary | ICD-10-CM | POA: Diagnosis present

## 2016-01-25 MED ORDER — INDOMETHACIN 50 MG PO CAPS: 50.0000 mg | ORAL_CAPSULE | Freq: Three times a day (TID) | ORAL | 0 refills | Status: DC | PRN

## 2016-01-25 MED ORDER — COLCHICINE 0.6 MG PO TABS: 0.6000 mg | ORAL_TABLET | Freq: Every day | ORAL | 0 refills | Status: DC

## 2016-01-25 MED ORDER — METHYLPREDNISOLONE SODIUM SUCC 125 MG IJ SOLR
60.0000 mg | Freq: Once | INTRAMUSCULAR | Status: AC
Start: 2016-01-25 — End: 2016-01-25
  Administered 2016-01-25: 60 mg via INTRAMUSCULAR
  Filled 2016-01-25: qty 2

## 2016-01-25 NOTE — ED Triage Notes (Signed)
Pt reports numbness, swelling and feeling of "pins and needles" to his hands. States since Wednesday and it feels like his gout.

## 2016-01-25 NOTE — ED Provider Notes (Signed)
[facility] Emergency Department Provider Note  ____________________________________________  Time seen: Approximately 7:42 AM  I have reviewed the triage vital signs and the nursing notes.   HISTORY  Chief Complaint Gout    HPI Paul [name]. is a 50 y.o. male , NAD, presents to the emergency department with three-day history of gout flare. Patient states he has had multiple flares of gout over the last couple of years. Was being seen at [facility] for such but states he was only treated for flares and not for chronic gout issues. States he is just recently moved to [place] area and has not established care with primary care at this time. Has had swelling, redness and warmth to the joints of the right hand which are common for his gout flares. Also notes a tingling and "pins and needles" sensation which again is common to his gout flares. Has taken prednisone, colchicine in the past with good results. Also notes he is diabetic and that his blood sugars usually do not elevate above 150-160. He has been checking his sugars regularly and states there has been no significant elevation of sugars at this time. He denies any chest pain, shortness of breath. Has not had any injuries or traumas to the right hand. Has had no fevers, chills or body aches. He does try to follow a low purine diet and avoid red meat.   Past Medical History:  Diagnosis Date  . AICD (automatic cardioverter/defibrillator) present   . Cardiac defibrillator in place   . CHF (congestive heart failure)    takes Torsemide daily  . CHF (congestive heart failure)   . Coronary artery disease   . DDD (degenerative disc disease), lumbosacral    L5-S1  . Diabetes    Lantus at bedtime  . Hypertension    takes Coreg daily  . Sleep apnea    sleep study yr ago-unable to afford cpap  . Stroke 09   no weakness    Patient Active Problem List   Diagnosis Date Noted  . 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 CARDIAC DEFIBRILLATOR PLACEMENT  2011  . CHOLECYSTECTOMY  2010  . CORONARY ARTERY BYPASS GRAFT  10  . HIP SURGERY Right 1994  . MASS EXCISION Right 07/18/2014   Procedure: EXCISION HETEROTOPIC BONE RIGHT HIP;  Surgeon: Frederik Pear, MD;  Location: [facility];  Service: Orthopedics;  Laterality: Right;  . Open Heart Surgery  2010   x 3    Prior to Admission medications   Medication Sig Start Date End Date Taking? Authorizing Provider  acetaminophen (TYLENOL) 500 MG tablet Take 1,500 mg by mouth every 8 (eight) hours as needed (pain).    Historical Provider, MD  aspirin EC 325 MG tablet Take 1 tablet (325 mg total) by mouth 2 (two) times daily. 07/20/14   Leighton Parody, PA-C  carvedilol (COREG) 25 MG tablet Take 25 mg by mouth 2 (two) times daily with a meal.    Historical Provider, MD  colchicine 0.6 MG tablet Take 1 tablet (0.6 mg total) by mouth daily. Take 2 tablets at onset of pain, then may take 1 more 1 hour later if pain continues. 01/25/16   Sergio Zawislak L Nysa Sarin, PA-C  indomethacin (INDOCIN) 50 MG capsule Take 1 capsule (50 mg total) by mouth 3 (three) times daily with meals as needed. 01/25/16   Rosmary Dionisio L Chelcea Zahn, PA-C  insulin glargine (LANTUS)  100 UNIT/ML injection Inject 26 Units into the skin at bedtime.     Historical Provider, MD  oxyCODONE-acetaminophen (ROXICET) 5-325 MG per tablet Take 1 tablet by mouth every 4 (four) hours as needed. 07/20/14   Leighton Parody, PA-C  torsemide (DEMADEX) 20 MG tablet Take 20 mg by mouth 2 (two) times daily.    Historical Provider, MD    Allergies Review of patient's allergies indicates no known allergies.  Family History  Problem Relation Age of Onset  . Hypertension Other   . Diabetes Other     Social History Social History  Substance Use Topics  . Smoking status: Never Smoker  . Smokeless tobacco: Never Used  . Alcohol use No     Review of Systems   Constitutional: No fever/chills Cardiovascular: No chest pain. Respiratory: No shortness of breath. Musculoskeletal: Positive right hand pain.  Skin: Positive redness, swelling and warmth to the right hand and joints of the right hand. Negative for rash, bruising, skin sores or open wounds. Neurological: Positive pins and needle sensation right hand. Negative for focal weakness or numbness. 10-point ROS otherwise negative.  ____________________________________________   PHYSICAL EXAM:  VITAL SIGNS: ED Triage Vitals [01/25/16 0624]  Enc Vitals Group     BP      Pulse      Resp      Temp      Temp src      SpO2      Weight 260 lb (117.9 kg)     Height 5\' 10"  (1.778 m)     Head Circumference      Peak Flow      Pain Score 10     Pain Loc      Pain Edu?      Excl. in [place]?      Constitutional: Alert and oriented. Well appearing and in no acute distress. Eyes: Conjunctivae are normal without icterus or injection. Head: Atraumatic. Neck: Supple with full range of motion. Hematological/Lymphatic/Immunilogical: No cervical lymphadenopathy. Cardiovascular: Normal rate, regular rhythm. Normal S1 and S2.  Good peripheral circulation with 2+ pulses noted in the right upper extremity. Capillary refill is brisk in all digits of the right hand. Respiratory: Normal respiratory effort without tachypnea or retractions. Lungs CTAB with breath sounds noted in all lung fields. No wheeze, rhonchi, rales Musculoskeletal: Tenderness to palpation diffusely about the right hand and fingers of the right hand. Decreased range of motion with gripping of the right hand due to pain and swelling.  No joint effusions. Neurologic:  Normal speech and language. No gross focal neurologic deficits are appreciated. Sensation to light touch grossly intact about bilateral upper extremities. Skin:  Skin about the right hand and fingers with moderate abnormal warmth and mild swelling. Some redness noted about the  joints of the index through little finger. Skin is warm, dry and intact. No rash, open wounds, skin sores noted. Psychiatric: Mood and affect are normal. Speech and behavior are normal. Patient exhibits appropriate insight and judgement.   ____________________________________________   LABS  None ____________________________________________  EKG  None ____________________________________________  RADIOLOGY  None ____________________________________________    PROCEDURES  Procedure(s) performed: None   Procedures   Medications  methylPREDNISolone sodium succinate (SOLU-MEDROL) 125 mg/2 mL injection 60 mg (60 mg Intramuscular Given 01/25/16 0801)     ____________________________________________   INITIAL IMPRESSION / ASSESSMENT AND PLAN / ED COURSE  Pertinent labs & imaging results that were available during my care of the patient were reviewed by  me and considered in my medical decision making (see chart for details).  Clinical Course    Patient's diagnosis is consistent with Acute gout of the right hand. Patient will be discharged home with prescriptions for indomethacin and colchicine to take as directed. Patient is advised to establish care with [facility] clinic for continued treatment of chronic medical conditions to include evaluation of gout and potential start of allopurinol. Patient is given ED precautions to return to the ED for any worsening or new symptoms.    ____________________________________________  FINAL CLINICAL IMPRESSION(S) / ED DIAGNOSES  Final diagnoses:  Acute gout of right hand, unspecified cause      NEW MEDICATIONS STARTED DURING THIS VISIT:  Discharge Medication List as of 01/25/2016  8:15 AM           Braxton Feathers, PA-C 01/25/16 0845    Lisa Roca, MD 01/25/16 1039

## 2016-01-25 NOTE — ED Notes (Signed)
Pt verbalized understanding of discharge instructions. NAD at this time. 

## 2016-01-25 NOTE — Discharge Instructions (Signed)
Limit strong gripping with the affected hand until flare has subsided.

## 2016-04-07 DIAGNOSIS — Z95810 Presence of automatic (implantable) cardiac defibrillator: Secondary | ICD-10-CM | POA: Diagnosis not present

## 2016-05-08 DIAGNOSIS — M10042 Idiopathic gout, left hand: Secondary | ICD-10-CM | POA: Diagnosis not present

## 2016-05-14 ENCOUNTER — Emergency Department: Payer: Medicare Other

## 2016-05-14 ENCOUNTER — Emergency Department
Admission: EM | Admit: 2016-05-14 | Discharge: 2016-05-14 | Disposition: A | Discharge status: Home / Self Care | Payer: Medicare Other | Attending: Emergency Medicine | Admitting: Emergency Medicine

## 2016-05-14 ENCOUNTER — Encounter: Payer: Self-pay | Admitting: Emergency Medicine

## 2016-05-14 DIAGNOSIS — I509 Heart failure, unspecified: Secondary | ICD-10-CM | POA: Diagnosis not present

## 2016-05-14 DIAGNOSIS — E119 Type 2 diabetes mellitus without complications: Secondary | ICD-10-CM | POA: Diagnosis not present

## 2016-05-14 DIAGNOSIS — M10042 Idiopathic gout, left hand: Secondary | ICD-10-CM | POA: Diagnosis not present

## 2016-05-14 DIAGNOSIS — M7989 Other specified soft tissue disorders: Secondary | ICD-10-CM | POA: Diagnosis present

## 2016-05-14 DIAGNOSIS — I110 Hypertensive heart disease with heart failure: Secondary | ICD-10-CM | POA: Insufficient documentation

## 2016-05-14 DIAGNOSIS — Z79899 Other long term (current) drug therapy: Secondary | ICD-10-CM | POA: Diagnosis not present

## 2016-05-14 DIAGNOSIS — Z7982 Long term (current) use of aspirin: Secondary | ICD-10-CM | POA: Insufficient documentation

## 2016-05-14 DIAGNOSIS — M109 Gout, unspecified: Secondary | ICD-10-CM

## 2016-05-14 LAB — URIC ACID: Uric Acid, Serum: 8.5 mg/dL — ABNORMAL HIGH (ref 4.4–7.6)

## 2016-05-14 MED ORDER — INDOMETHACIN 50 MG PO CAPS: 50.0000 mg | ORAL_CAPSULE | Freq: Three times a day (TID) | ORAL | 0 refills | Status: DC | PRN

## 2016-05-14 MED ORDER — COLCHICINE 0.6 MG PO TABS: 0.6000 mg | ORAL_TABLET | Freq: Every day | ORAL | 0 refills | Status: DC

## 2016-05-14 NOTE — ED Triage Notes (Signed)
Pt with gout in left hand for one week. Has been seen for same before. Pt was given meds for same but did not get filled. Swelling noted to left hand.

## 2016-05-14 NOTE — ED Notes (Signed)
See triage note  States swelling to left hand for about 2 weeks  Was seen at [facility] and placed on steroids  No relief

## 2016-05-14 NOTE — ED Provider Notes (Signed)
[facility] Emergency Department Provider Note  ____________________________________________  Time seen: Approximately 8:13 AM  I have reviewed the triage vital signs and the nursing notes.   HISTORY  Chief Complaint Hand Problem    HPI Paul [name]. is a 51 y.o. male presents to the emergency department with left hand swelling for 2 weeks. Patient states that the swelling started over the base of his index finger. Patient states that he gets a gout flare every 2-3 months in this hand. Last time, he came to the emergency department he was placed on indomethacin and colchicine, which greatly improved his symptoms. Patient went to the [facility] clinic and was given steroids which he finished, and swelling has not improved. Patient states he still has good sensation in his fingers but range of motion is limited due to swelling. Patient denies fever, chills, trauma, nausea, vomiting.   Past Medical History:  Diagnosis Date  . AICD (automatic cardioverter/defibrillator) present   . Cardiac defibrillator in place   . CHF (congestive heart failure) (HCC)    takes Torsemide daily  . CHF (congestive heart failure) ([place])   . Coronary artery disease   . DDD (degenerative disc disease), lumbosacral    L5-S1  . Diabetes ([place])    Lantus at bedtime  . Hypertension    takes Coreg daily  . Sleep apnea    sleep study yr ago-unable to afford cpap  . Stroke [facility]) 09   no weakness    Patient Active Problem List   Diagnosis Date Noted  . Heterotopic ossification of bone 07/12/2014    Past Surgical History:  Procedure Laterality Date  . CARDIAC DEFIBRILLATOR PLACEMENT  2011  . CHOLECYSTECTOMY  2010  . CORONARY ARTERY BYPASS GRAFT  10  . HIP SURGERY Right 1994  . MASS EXCISION Right 07/18/2014   Procedure: EXCISION HETEROTOPIC BONE RIGHT HIP;  Surgeon: Frederik Pear, MD;  Location: [place];  Service: Orthopedics;  Laterality: Right;  . Open Heart Surgery  2010   x 3     Prior to Admission medications   Medication Sig Start Date End Date Taking? Authorizing Provider  acetaminophen (TYLENOL) 500 MG tablet Take 1,500 mg by mouth every 8 (eight) hours as needed (pain).    Historical Provider, MD  aspirin EC 325 MG tablet Take 1 tablet (325 mg total) by mouth 2 (two) times daily. 07/20/14   Leighton Parody, PA-C  carvedilol (COREG) 25 MG tablet Take 25 mg by mouth 2 (two) times daily with a meal.    Historical Provider, MD  colchicine 0.6 MG tablet Take 1 tablet (0.6 mg total) by mouth daily. Take 2 tablets at onset of pain, then may take 1 more 1 hour later if pain continues. 05/14/16   Laban Emperor, PA-C  indomethacin (INDOCIN) 50 MG capsule Take 1 capsule (50 mg total) by mouth 3 (three) times daily with meals as needed. 05/14/16   Laban Emperor, PA-C  insulin glargine (LANTUS) 100 UNIT/ML injection Inject 26 Units into the skin at bedtime.     Historical Provider, MD  torsemide (DEMADEX) 20 MG tablet Take 20 mg by mouth 2 (two) times daily.    Historical Provider, MD    Allergies Patient has no known allergies.  Family History  Problem Relation Age of Onset  . Hypertension Other   . Diabetes Other     Social History Social History  Substance Use Topics  . Smoking status: Never Smoker  . Smokeless tobacco: Never  Used  . Alcohol use No     Review of Systems  Constitutional: No fever/chills ENT: No upper respiratory complaints. Cardiovascular: No chest pain. Respiratory: No cough. No SOB. Gastrointestinal: No abdominal pain.  No nausea, no vomiting.  Skin: Negative for rash, abrasions, lacerations, ecchymosis. Neurological: Negative for headaches, numbness or tingling   ____________________________________________   PHYSICAL EXAM:  VITAL SIGNS: ED Triage Vitals [05/14/16 0738]  Enc Vitals Group     BP (!) 181/95     Pulse Rate 97     Resp 20     Temp 97.4 F (36.3 C)     Temp Source Oral     SpO2 96 %     Weight 260 lb (117.9  kg)     Height      Head Circumference      Peak Flow      Pain Score 10     Pain Loc      Pain Edu?      Excl. in [place]?      Constitutional: Alert and oriented. Well appearing and in no acute distress. Eyes: Conjunctivae are normal. PERRL. EOMI. Head: Atraumatic. ENT:      Ears:      Nose: No congestion/rhinnorhea.      Mouth/Throat: Mucous membranes are moist.  Neck: No stridor.  Cardiovascular: Normal rate, regular rhythm. Normal S1 and S2.  Good peripheral circulation. Respiratory: Normal respiratory effort without tachypnea or retractions. Lungs CTAB. Good air entry to the bases with no decreased or absent breath sounds. Musculoskeletal: Diffuse swelling of left hand. Tenderness to palpation over left metacarpals. Neurologic:  Normal speech and language. No gross focal neurologic deficits are appreciated.  Skin:  Skin is warm, dry and intact. No rash noted. Psychiatric: Mood and affect are normal. Speech and behavior are normal. Patient exhibits appropriate insight and judgement.   ____________________________________________   LABS (all labs ordered are listed, but only abnormal results are displayed)  Labs Reviewed  URIC ACID - Abnormal; Notable for the following:       Result Value   Uric Acid, Serum 8.5 (*)    All other components within normal limits   ____________________________________________  EKG   ____________________________________________  RADIOLOGY Robinette Haines, personally viewed and evaluated these images (plain radiographs) as part of my medical decision making, as well as reviewing the written report by the radiologist.  Dg Hand Complete Left  Result Date: 05/14/2016 CLINICAL DATA:  Left hand swelling for 2 weeks, history of gout EXAM: LEFT HAND - COMPLETE 3+ VIEW COMPARISON:  None. FINDINGS: The radiocarpal joint space appears normal. The ulnar styloid is intact. The carpal bones are in normal position. Arterial calcification is noted. MCP,  PIP, and DIP joints are unremarkable with no erosions noted. There is soft tissue prominence over the dorsum of the hand. IMPRESSION: 1. No erosions.  Joint spaces appear normal. 2. Soft tissue swelling over the dorsum of the hand. 3. Arterial calcification is noted. Electronically Signed   By: Ivar Drape M.D.   On: 05/14/2016 08:28    ____________________________________________    PROCEDURES  Procedure(s) performed:    Procedures    Medications - No data to display   ____________________________________________   INITIAL IMPRESSION / ASSESSMENT AND PLAN / ED COURSE  Pertinent labs & imaging results that were available during my care of the patient were reviewed by me and considered in my medical decision making (see chart for details).  Review of the [place] CSRS was  performed in accordance of the [place] prior to dispensing any controlled drugs.  Clinical Course as of May 14 940  Thu May 14, 2016  0902 Uric Acid, Serum: (!) 8.5 [AW]    Clinical Course User Index [AW] Laban Emperor, PA-C    Patient's diagnosis is consistent with gout of left hand. No acute bony abnormalities seen on x-ray. Uric acid elevated. Exam and vital signs are reassuring. Patient will be discharged home with prescriptions for indomethacin and colchicine. Patient is to follow up with PCP as directed. Patient is given ED precautions to return to the ED for any worsening or new symptoms.     ____________________________________________  FINAL CLINICAL IMPRESSION(S) / ED DIAGNOSES  Final diagnoses:  Acute gout of left hand, unspecified cause      NEW MEDICATIONS STARTED DURING THIS VISIT:  Discharge Medication List as of 05/14/2016  9:25 AM          This chart was dictated using voice recognition software/Dragon. Despite best efforts to proofread, errors can occur which can change the meaning. Any change was purely unintentional.    Laban Emperor, PA-C 05/14/16 6283    Daymon Larsen, MD 05/14/16 1056

## 2016-06-07 ENCOUNTER — Emergency Department
Admission: EM | Admit: 2016-06-07 | Discharge: 2016-06-07 | Disposition: A | Discharge status: Home / Self Care | Payer: Medicare Other | Attending: Emergency Medicine | Admitting: Emergency Medicine

## 2016-06-07 ENCOUNTER — Encounter: Payer: Self-pay | Admitting: Emergency Medicine

## 2016-06-07 DIAGNOSIS — M10042 Idiopathic gout, left hand: Secondary | ICD-10-CM | POA: Insufficient documentation

## 2016-06-07 DIAGNOSIS — I110 Hypertensive heart disease with heart failure: Secondary | ICD-10-CM | POA: Insufficient documentation

## 2016-06-07 DIAGNOSIS — Z79899 Other long term (current) drug therapy: Secondary | ICD-10-CM | POA: Insufficient documentation

## 2016-06-07 DIAGNOSIS — I2510 Atherosclerotic heart disease of native coronary artery without angina pectoris: Secondary | ICD-10-CM | POA: Insufficient documentation

## 2016-06-07 DIAGNOSIS — Z794 Long term (current) use of insulin: Secondary | ICD-10-CM | POA: Diagnosis not present

## 2016-06-07 DIAGNOSIS — I509 Heart failure, unspecified: Secondary | ICD-10-CM | POA: Insufficient documentation

## 2016-06-07 DIAGNOSIS — Z760 Encounter for issue of repeat prescription: Secondary | ICD-10-CM | POA: Insufficient documentation

## 2016-06-07 DIAGNOSIS — E119 Type 2 diabetes mellitus without complications: Secondary | ICD-10-CM | POA: Diagnosis not present

## 2016-06-07 MED ORDER — INDOMETHACIN 50 MG PO CAPS: 50.0000 mg | ORAL_CAPSULE | Freq: Three times a day (TID) | ORAL | 0 refills | Status: DC

## 2016-06-07 MED ORDER — COLCHICINE 0.6 MG PO TABS: ORAL_TABLET | ORAL | 0 refills | Status: DC

## 2016-06-07 NOTE — ED Notes (Signed)

## 2016-06-07 NOTE — ED Triage Notes (Signed)
Pt presents to ED c/o medication refill for gout flare in the left hand. Pt takes indomethacin

## 2016-06-07 NOTE — ED Provider Notes (Signed)
[facility] Emergency Department Provider Note ____________________________________________  Time seen: 1327  I have reviewed the triage vital signs and the nursing notes.  HISTORY  Chief Complaint  Medication Refill (gout)  HPI Paul Thurman. is a 51 y.o. male presents to the ED with a 2-3 day complaint of swelling to the left greater than right hands. Patient with a history of elevated uric acid and gout, presents to the ED for prescription management of his symptoms. He reports he was briefly given prescription for colchicine and considered. He was he reports being out of his medications at this time. He denies any fevers, chills, sweats.  Past Medical History:  Diagnosis Date  . AICD (automatic cardioverter/defibrillator) present   . Cardiac defibrillator in place   . CHF (congestive heart failure) (HCC)    takes Torsemide daily  . CHF (congestive heart failure) ([place])   . Coronary artery disease   . DDD (degenerative disc disease), lumbosacral    L5-S1  . Diabetes ([place])    Lantus at bedtime  . Hypertension    takes Coreg daily  . Sleep apnea    sleep study yr ago-unable to afford cpap  . Stroke [facility]) 09   no weakness    Patient Active Problem List   Diagnosis Date Noted  . Heterotopic ossification of bone 07/12/2014    Past Surgical History:  Procedure Laterality Date  . CARDIAC DEFIBRILLATOR PLACEMENT  2011  . CHOLECYSTECTOMY  2010  . CORONARY ARTERY BYPASS GRAFT  10  . HIP SURGERY Right 1994  . MASS EXCISION Right 07/18/2014   Procedure: EXCISION HETEROTOPIC BONE RIGHT HIP;  Surgeon: Frederik Pear, MD;  Location: [place];  Service: Orthopedics;  Laterality: Right;  . Open Heart Surgery  2010   x 3    Prior to Admission medications   Medication Sig Start Date End Date Taking? Authorizing Provider  acetaminophen (TYLENOL) 500 MG tablet Take 1,500 mg by mouth every 8 (eight) hours as needed (pain).    Historical Provider, MD  aspirin EC  325 MG tablet Take 1 tablet (325 mg total) by mouth 2 (two) times daily. 07/20/14   Leighton Parody, PA-C  carvedilol (COREG) 25 MG tablet Take 25 mg by mouth 2 (two) times daily with a meal.    Historical Provider, MD  colchicine 0.6 MG tablet Take 1 tablet (0.6 mg total) by mouth daily. Take 2 tablets at onset of pain, then may take 1 more 1 hour later if pain continues. 05/14/16   Laban Emperor, PA-C  colchicine 0.6 MG tablet Take 2 tabs PO x 1, then 1 tab PO 1 hour later x 1  Max: 1.8 mg total dose per attack, do not repeat within 3 days. 06/07/16   Gustav Knueppel V Bacon Jazmon Kos, PA-C  indomethacin (INDOCIN) 50 MG capsule Take 1 capsule (50 mg total) by mouth 3 (three) times daily with meals as needed. 05/14/16   Laban Emperor, PA-C  indomethacin (INDOCIN) 50 MG capsule Take 1 capsule (50 mg total) by mouth 3 (three) times daily with meals. 06/07/16   Braxson Hollingsworth V Bacon Marcus Groll, PA-C  insulin glargine (LANTUS) 100 UNIT/ML injection Inject 26 Units into the skin at bedtime.     Historical Provider, MD  torsemide (DEMADEX) 20 MG tablet Take 20 mg by mouth 2 (two) times daily.    Historical Provider, MD    Allergies Patient has no known allergies.  Family History  Problem Relation Age of Onset  . Hypertension  Other   . Diabetes Other     Social History Social History  Substance Use Topics  . Smoking status: Never Smoker  . Smokeless tobacco: Never Used  . Alcohol use No    Review of Systems  Constitutional: Negative for fever. Cardiovascular: Negative for chest pain. Respiratory: Negative for shortness of breath. Gastrointestinal: Negative for abdominal pain, vomiting and diarrhea. Genitourinary: Negative for dysuria. Musculoskeletal: Negative for back pain. Hand pain and swelling as above. Skin: Negative for rash. Neurological: Negative for headaches, focal weakness or numbness. ____________________________________________  PHYSICAL EXAM:  VITAL SIGNS: ED Triage Vitals  Enc Vitals Group      BP 06/07/16 1216 (!) 149/76     Pulse Rate 06/07/16 1216 94     Resp 06/07/16 1216 18     Temp 06/07/16 1216 97.8 F (36.6 C)     Temp Source 06/07/16 1216 Oral     SpO2 06/07/16 1216 96 %     Weight 06/07/16 1217 260 lb (117.9 kg)     Height 06/07/16 1217 5\' 10"  (1.778 m)     Head Circumference --      Peak Flow --      Pain Score 06/07/16 1223 5     Pain Loc --      Pain Edu? --      Excl. in [place]? --    Constitutional: Alert and oriented. Well appearing and in no distress. Head: Normocephalic and atraumatic. Cardiovascular: Normal rate, regular rhythm. Normal distal pulses. Respiratory: Normal respiratory effort. No wheezes/rales/rhonchi. Gastrointestinal: Soft and nontender. No distention. Musculoskeletal: Patient's left hand with obvious dorsal swelling over the second and third MCPs. He also has bilateral interosseous muscle wasting the first interspace. Normal composite fist bilaterally. Nontender with normal range of motion in all extremities.  Neurologic:  Normal gait without ataxia. Normal speech and language. No gross focal neurologic deficits are appreciated. Skin:  Skin is warm, dry and intact. No rash noted. Psychiatric: Mood and affect are normal. Patient exhibits appropriate insight and judgment. ____________________________________________  INITIAL IMPRESSION / ASSESSMENT AND PLAN / ED COURSE  Patient with a clinical presentation consistent with an acute gout flare to the left hand. He is discharged at this time with a prescription for colchicine and indomethacin. He is advised to follow with his primary care provider as scheduled and return to the ED as needed. ____________________________________________  FINAL CLINICAL IMPRESSION(S) / ED DIAGNOSES  Final diagnoses:  Medication refill  Acute idiopathic gout of left hand      Melvenia Needles, PA-C 06/07/16 1834    Orbie Pyo, MD 06/08/16 [phone]

## 2016-06-07 NOTE — Discharge Instructions (Signed)
Take the prescription meds as directed. Follow-up with your provider as scheduled.

## 2016-06-15 DIAGNOSIS — Z794 Long term (current) use of insulin: Secondary | ICD-10-CM | POA: Diagnosis not present

## 2016-06-15 DIAGNOSIS — I509 Heart failure, unspecified: Secondary | ICD-10-CM | POA: Diagnosis not present

## 2016-06-15 DIAGNOSIS — M109 Gout, unspecified: Secondary | ICD-10-CM | POA: Diagnosis not present

## 2016-06-15 DIAGNOSIS — I10 Essential (primary) hypertension: Secondary | ICD-10-CM | POA: Diagnosis not present

## 2016-06-15 DIAGNOSIS — E119 Type 2 diabetes mellitus without complications: Secondary | ICD-10-CM | POA: Diagnosis not present

## 2016-06-22 ENCOUNTER — Encounter: Payer: Self-pay | Admitting: Cardiology

## 2016-06-23 ENCOUNTER — Encounter (INDEPENDENT_AMBULATORY_CARE_PROVIDER_SITE_OTHER): Payer: Self-pay

## 2016-06-23 ENCOUNTER — Ambulatory Visit (INDEPENDENT_AMBULATORY_CARE_PROVIDER_SITE_OTHER): Payer: Medicare Other | Admitting: Cardiology

## 2016-06-23 ENCOUNTER — Encounter: Payer: Self-pay | Admitting: Cardiology

## 2016-06-23 VITALS — BP 142/96 | HR 85 | Ht 70.0 in | Wt 281.0 lb

## 2016-06-23 DIAGNOSIS — I5042 Chronic combined systolic (congestive) and diastolic (congestive) heart failure: Secondary | ICD-10-CM | POA: Diagnosis not present

## 2016-06-23 DIAGNOSIS — Z79899 Other long term (current) drug therapy: Secondary | ICD-10-CM

## 2016-06-23 DIAGNOSIS — Z95810 Presence of automatic (implantable) cardiac defibrillator: Secondary | ICD-10-CM

## 2016-06-23 MED ORDER — VALSARTAN 40 MG PO TABS: 40.0000 mg | ORAL_TABLET | Freq: Two times a day (BID) | ORAL | 4 refills | Status: DC

## 2016-06-23 MED ORDER — TORSEMIDE 20 MG PO TABS
40.0000 mg | ORAL_TABLET | Freq: Two times a day (BID) | ORAL | 4 refills | Status: DC
Start: 2016-06-23 — End: 2016-08-27

## 2016-06-23 NOTE — Patient Instructions (Addendum)
Medication Instructions:  Your physician has recommended you make the following change in your medication:  1) START Diovan 40 mg twice daily 2) INCREASE Torsemide to 40 mg (2 tablets) twice daily    Labwork: TODAY: BMET   Testing/Procedures: Your physician has requested that you have an echocardiogram. Echocardiography is a painless test that uses sound waves to create images of your heart. It provides your doctor with information about the size and shape of your heart and how well your heart's chambers and valves are working. This procedure takes approximately one hour. There are no restrictions for this procedure.     Follow-Up: Your physician recommends that you schedule a follow-up appointment in:  Dr. Radford Pax in 2 weeks (sleep study) and 1 month General Cardiology in [facility]     Any Other Special Instructions Will Be Listed Below (If Applicable).     If you need a refill on your cardiac medications before your next appointment, please call your pharmacy.

## 2016-06-23 NOTE — Progress Notes (Signed)
Electrophysiology Office Note   Date:  06/23/2016   ID:  Paul Smoker., DOB 04-10-1966, MRN [id number]  PCP:  Kandice Hams, MD  Primary Electrophysiologist:  Constance Haw, MD    Chief Complaint  Patient presents with  . Pacemaker Check    new pt/CHF     History of Present Illness: Paul [name]. is a 51 y.o. male who presents today for electrophysiology evaluation.   He has a history of CHF, coronary artery disease, diabetes poorly controlled with an A1c of 11, hypertension, stroke, chronic kidney disease, hyperlipidemia and has an ICD in place. He has previously been followed by cardiology, but was discharged from the practice.His ejection fraction is 20-25%. He initially had nonischemic cardio myopathy, but subsequently developed coronary disease requiring bypass operation.    Today, he denies symptoms of palpitations, chest pain,  orthopnea, lower extremity edema, claudication, dizziness, presyncope, syncope, bleeding, or neurologic sequela. The patient is tolerating medications without difficulties and is otherwise without complaint today. He says that he has gained approximately 30 pounds since he was last in the hospital. His torsemide was increased to 40 mg once a day, but it is not working as he is on lost a couple pounds since it was started a few days ago. He is continuing to have shortness of breath and significant lower extremity edema. He has been sleeping on the couch on his side and has noticed that he does wake up short of breath at times. He says that he eats out most the time, as he is a Training and development officer. He was told that he had gout and therefore he is away from red meat and pork. He says that he is she's sandwiches most often, and tries to stay away from salty and prepared foods.   Past Medical History:  Diagnosis Date  . AICD (automatic cardioverter/defibrillator) present   . Cardiac defibrillator in place   . CHF (congestive heart failure) (HCC)    takes  Torsemide daily  . CHF (congestive heart failure) ([place])   . Coronary artery disease   . DDD (degenerative disc disease), lumbosacral    L5-S1  . Diabetes ([place])    Lantus at bedtime  . Hypertension    takes Coreg daily  . Sleep apnea    sleep study yr ago-unable to afford cpap  . Stroke [facility]) 09   no weakness   Past Surgical History:  Procedure Laterality Date  . CARDIAC DEFIBRILLATOR PLACEMENT  2011  . CHOLECYSTECTOMY  2010  . CORONARY ARTERY BYPASS GRAFT  10  . HIP SURGERY Right 1994  . MASS EXCISION Right 07/18/2014   Procedure: EXCISION HETEROTOPIC BONE RIGHT HIP;  Surgeon: Frederik Pear, MD;  Location: [place];  Service: Orthopedics;  Laterality: Right;  . Open Heart Surgery  2010   x 3     Current Outpatient Prescriptions  Medication Sig Dispense Refill  . aspirin EC 325 MG tablet Take 1 tablet (325 mg total) by mouth 2 (two) times daily. 30 tablet 0  . carvedilol (COREG) 25 MG tablet Take 25 mg by mouth 2 (two) times daily with a meal.    . colchicine 0.6 MG tablet Take 1 tablet (0.6 mg total) by mouth daily. Take 2 tablets at onset of pain, then may take 1 more 1 hour later if pain continues. 6 tablet 0  . indomethacin (INDOCIN) 50 MG capsule Take 1 capsule (50 mg total) by mouth 3 (three) times daily with meals as needed.  21 capsule 0  . insulin glargine (LANTUS) 100 UNIT/ML injection Inject 26 Units into the skin at bedtime.     . torsemide (DEMADEX) 20 MG tablet Take 20 mg by mouth 2 (two) times daily.     No current facility-administered medications for this visit.     Allergies:   Ibuprofen   Social History:  The patient  reports that he has never smoked. He has never used smokeless tobacco. He reports that he does not drink alcohol or use drugs.   Family History:  The patient's family history includes Diabetes in his brother, father, other, and sister; Hyperlipidemia in his brother, father, and sister; Hypertension in his brother, father, other, and sister.     ROS:  Please see the history of present illness.   Otherwise, review of systems is positive for Weight change, leg swelling, cough, snoring, diarrhea, balance problems.   All other systems are reviewed and negative.    PHYSICAL EXAM: VS:  BP (!) 142/96   Pulse 85   Ht 5\' 10"  (1.778 m)   Wt 281 lb (127.5 kg)   BMI 40.32 kg/m  , BMI Body mass index is 40.32 kg/m. GEN: Well nourished, well developed, in no acute distress  HEENT: normal  Neck: no JVD, carotid bruits, or masses Cardiac: RRR; no murmurs, rubs, or gallops, 2-3+ edema  Respiratory:  clear to auscultation bilaterally, normal work of breathing GI: soft, nontender, nondistended, + BS MS: no deformity or atrophy  Skin: warm and dry, device pocket is well healed Neuro:  Strength and sensation are intact Psych: euthymic mood, full affect  EKG:  EKG is ordered today. Personal review of the ekg ordered shows ectopic atrial rhythm, right bundle branch block, left anterior fascicular block, nonspecific T wave abnormalities, rate 85   Device interrogation is reviewed today in detail.  See PaceArt for details.   Recent Labs: No results found for requested labs within last 8760 hours.    Lipid Panel  No results found for: CHOL, TRIG, HDL, CHOLHDL, VLDL, LDLCALC, LDLDIRECT   Wt Readings from Last 3 Encounters:  06/23/16 281 lb (127.5 kg)  06/07/16 260 lb (117.9 kg)  05/14/16 260 lb (117.9 kg)      Other studies Reviewed: Additional studies/ records that were reviewed today include: PCP notes   ASSESSMENT AND PLAN:  1.  Mixed ischemic and nonischemic cardiomyopathy: Status post Medtronic ICD. Currently he is on carvedilol, but is not on other medications for optimal medical therapy. Start him on lisinopril today 10 mg. We will also get a basic metabolic in one week to further assess his kidney disease and his electrolytes since starting the new medication. He may eventually benefit from Aldactone. We'll also repeat an  echo for our records.I do suspect that he has significant dietary noncompliance which is likely contributing to his volume overload and decompensated heart failure. He did receive an ICD shock, but on interrogation it looks like this was an atrial rhythm, potentially an atrial tachycardia. This may be improved as his heart failure improves.  2. Coronary artery disease: Status post coronary artery bypass. We'll work to get the results of his most recent catheterization to further determine his anatomy. Continue aspirin.  3. Hypertension: Currently poorly controlled at 142/96. He is on carvedilol 25 mg twice a day. We'll add 10 mg of lisinopril.  4. Obstructive sleep apnea: Has been diagnosed in the past, but has not been wearing CPAP. Will refer to sleep clinic.  5. Type  2 diabetes: Had a significantly elevated A1c on his last check at 11. Managed by primary care. I did discuss with him the complications of diabetes including chronic kidney disease, which it appears that he has with a creatinine of 1.6, neuropathy, and I problems. Of note he did complain of lower extremity numbness and tingling.   Current medicines are reviewed at length with the patient today.   The patient does not have concerns regarding his medicines.  The following changes were made today:  none  Labs/ tests ordered today include:  No orders of the defined types were placed in this encounter.    Disposition:   FU with [facility] 1 month The patient lives near the [facility], and we'll therefore have him follow-up in [facility] for both general cardiology and electrophysiology.  Signed, Will Meredith Leeds, MD  06/23/2016 2:33 PM     [place] [address] [place] [address] [phone] (office) [phone] (fax)

## 2016-06-24 LAB — CUP PACEART INCLINIC DEVICE CHECK
Battery Remaining Percentage: 12 %
HIGH POWER IMPEDANCE MEASURED VALUE: 41 Ohm
Lead Channel Impedance Value: 567 Ohm
Lead Channel Pacing Threshold Amplitude: 0.7 V
Lead Channel Pacing Threshold Amplitude: 0.9 V
Lead Channel Pacing Threshold Pulse Width: 0.4 ms
Lead Channel Pacing Threshold Pulse Width: 0.4 ms
Lead Channel Sensing Intrinsic Amplitude: 1.6 mV
Lead Channel Setting Pacing Amplitude: 2 V
Lead Channel Setting Pacing Amplitude: 2.4 V
Lead Channel Setting Pacing Pulse Width: 0.4 ms
MDC IDC MSMT LEADCHNL RA IMPEDANCE VALUE: 445 Ohm
MDC IDC MSMT LEADCHNL RV SENSING INTR AMPL: 5.6 mV
MDC IDC PG IMPLANT DT: 20110215
MDC IDC SESS DTM: 20180221175100
Pulse Gen Serial Number: 60489978

## 2016-06-24 LAB — BASIC METABOLIC PANEL
BUN/Creatinine Ratio: 26 — ABNORMAL HIGH (ref 9–20)
BUN: 46 mg/dL — ABNORMAL HIGH (ref 6–24)
CALCIUM: 9.7 mg/dL (ref 8.7–10.2)
CHLORIDE: 96 mmol/L (ref 96–106)
CO2: 23 mmol/L (ref 18–29)
Creatinine, Ser: 1.8 mg/dL — ABNORMAL HIGH (ref 0.76–1.27)
GFR calc Af Amer: 49 — ABNORMAL LOW (ref >59)
GFR calc non Af Amer: 43 — ABNORMAL LOW (ref >59)
Glucose: 185 mg/dL — ABNORMAL HIGH (ref 65–99)
Potassium: 4.9 mmol/L (ref 3.5–5.2)
Sodium: 139 mmol/L (ref 134–144)

## 2016-06-25 ENCOUNTER — Telehealth: Payer: Self-pay | Admitting: *Deleted

## 2016-06-25 NOTE — Telephone Encounter (Signed)
Called the patient today and got his sleep study at [facility] in [facility] on [address]

## 2016-06-25 NOTE — Telephone Encounter (Signed)
-----   Message from Theodoro Parma, RN sent at 06/23/2016  5:09 PM EST ----- Regarding: FW: 06/23/16  [name],PER DR.CAMNITZ,THIS PATIENT NEEDS TO SEE DR.TURNER IN 2 WKS.    THANKS Please call patient. We need to find out where he had his sleep study and get records ASAP. Once we get, I will schedule OV with TT.  Thanks! ----- Message ----- From: Philbert Riser Sent: 06/23/2016   3:33 PM To: Stanton Kidney, RN, Theodoro Parma, RN Subject: 06/23/16  [name],PER DR.CAMNITZ,THIS PATIENT NE#

## 2016-07-01 ENCOUNTER — Encounter: Payer: Self-pay | Admitting: *Deleted

## 2016-07-01 ENCOUNTER — Telehealth: Payer: Self-pay | Admitting: *Deleted

## 2016-07-01 DIAGNOSIS — G4733 Obstructive sleep apnea (adult) (pediatric): Secondary | ICD-10-CM

## 2016-07-01 NOTE — Telephone Encounter (Signed)
-----   Message from Sueanne Margarita, MD sent at 06/28/2016  7:30 PM EST ----- Patient has mild OSA with nocturnal hypoxemia - if he has not bee set up for CPAP titration please set up in lab.  If he already has his CPAP please find out when he started it

## 2016-07-01 NOTE — Telephone Encounter (Signed)
Called the patient with his results and recommendations, he verbalized understanding and agreed. Cpap titration is scheduled today

## 2016-07-02 ENCOUNTER — Telehealth: Payer: Self-pay | Admitting: *Deleted

## 2016-07-02 NOTE — Telephone Encounter (Signed)
Sleep study uploaded under procedures tab

## 2016-07-02 NOTE — Telephone Encounter (Signed)
-----   Message from Theodoro Parma, RN sent at 06/26/2016  4:39 PM EST ----- Regarding: FW: 06/23/16  [name],PER DR.CAMNITZ,THIS PATIENT NEEDS TO SEE DR.TURNER IN 2 WKS.    THANKS I have scheduled this patient for follow-up.  Please keep an eye out to make sure the sleep study gets uploaded to [facility].  Thank you! ----- Message ----- From: Philbert Riser Sent: 06/23/2016   3:33 PM To: Stanton Kidney, RN, Theodoro Parma, RN Subject: 06/23/16  [name],PER DR.CAMNITZ,THIS PATIENT NE#

## 2016-07-09 ENCOUNTER — Other Ambulatory Visit (HOSPITAL_COMMUNITY): Payer: Medicare Other

## 2016-07-13 ENCOUNTER — Other Ambulatory Visit: Payer: Self-pay

## 2016-07-13 ENCOUNTER — Ambulatory Visit (HOSPITAL_COMMUNITY): Payer: Medicare Other | Attending: Cardiology

## 2016-07-13 DIAGNOSIS — N183 Chronic kidney disease, stage 3 (moderate): Secondary | ICD-10-CM | POA: Diagnosis not present

## 2016-07-13 DIAGNOSIS — I340 Nonrheumatic mitral (valve) insufficiency: Secondary | ICD-10-CM | POA: Insufficient documentation

## 2016-07-13 DIAGNOSIS — R29898 Other symptoms and signs involving the musculoskeletal system: Secondary | ICD-10-CM | POA: Diagnosis not present

## 2016-07-13 DIAGNOSIS — E119 Type 2 diabetes mellitus without complications: Secondary | ICD-10-CM | POA: Diagnosis not present

## 2016-07-13 DIAGNOSIS — I5042 Chronic combined systolic (congestive) and diastolic (congestive) heart failure: Secondary | ICD-10-CM | POA: Diagnosis not present

## 2016-07-16 ENCOUNTER — Ambulatory Visit: Payer: Medicare Other | Admitting: Cardiology

## 2016-07-21 ENCOUNTER — Encounter: Payer: Self-pay | Admitting: Cardiology

## 2016-07-21 ENCOUNTER — Ambulatory Visit (INDEPENDENT_AMBULATORY_CARE_PROVIDER_SITE_OTHER): Payer: Medicare Other | Admitting: Cardiology

## 2016-07-21 VITALS — BP 140/96 | HR 73 | Ht 69.0 in | Wt 269.8 lb

## 2016-07-21 DIAGNOSIS — I5022 Chronic systolic (congestive) heart failure: Secondary | ICD-10-CM | POA: Diagnosis not present

## 2016-07-21 DIAGNOSIS — I2510 Atherosclerotic heart disease of native coronary artery without angina pectoris: Secondary | ICD-10-CM | POA: Diagnosis not present

## 2016-07-21 DIAGNOSIS — I10 Essential (primary) hypertension: Secondary | ICD-10-CM

## 2016-07-21 NOTE — Progress Notes (Addendum)
Cardiology Office Note   Date:  07/21/2016   ID:  [name], DOB 1965/06/03, MRN [id number]  Referring Doctor:  Kandice Hams, MD   Cardiologist:   Wende Bushy, MD   Reason for consultation:  Chief Complaint  Patient presents with  . other    Pt. is being followed by Dr. Curt Bears in [facility] for his ICD. Pt. is estabishing care in [facility] for a h/o CAD/CHF.  Pt. c/o chest pain and shortness of breath with LE edema and has CHF. Meds reviewed by the pt.'s bottles.       History of Present Illness: Paul [name]. is a 51 y.o. male who presents for Establishing care for coronary artery disease, CHF  He was last seen by EP 06/23/2016: Trying to optimize medical therapy, prescribed ACE inhibitor. However, patient now remembers that that was the medication that was discontinued in the hospital when he presented with passing out. He does not want to retry it again due to concerns of same thing happening. It sounds like that there was some significant orthostasis on lisinopril.  Since doubling the dose of torsemide from 40 daily to 40 twice a day, patient has an had significant weight loss. He feels back to his baseline. He usually knows when he is filling up with fluid, as his regular parents will not fit him. He is back to 40 mg daily of torsemide. He reports getting blood work done just last week through his PCP.  In terms of shortness of breath he is at baseline, [place] Heart Association functional class III. no chest pain. No orthopnea. He has chronic edema which gets better if he is not on his feet the whole day. 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 He will try to get compression stockings.  No loss of consciousness, no palpitations.  ROS:  Please see the history of present illness. Aside from mentioned under HPI, all other systems are reviewed and negative.     Past Medical History:  Diagnosis  Date  . AICD (automatic cardioverter/defibrillator) present   . Cardiac defibrillator in place   . CHF (congestive heart failure) (HCC)    takes Torsemide daily  . CHF (congestive heart failure) ([place])   . Coronary artery disease   . DDD (degenerative disc disease), lumbosacral    L5-S1  . Diabetes ([place])    Lantus at bedtime  . Hypertension    takes Coreg daily  . Sleep apnea    sleep study yr ago-unable to afford cpap  . Stroke [facility]) 09   no weakness    Past Surgical History:  Procedure Laterality Date  . CARDIAC DEFIBRILLATOR PLACEMENT  2011  . CHOLECYSTECTOMY  2010  . CORONARY ARTERY BYPASS GRAFT  2010   CABG x 3   . HIP SURGERY Right 1994  . MASS EXCISION Right 07/18/2014   Procedure: EXCISION HETEROTOPIC BONE RIGHT HIP;  Surgeon: Frederik Pear, MD;  Location: [place];  Service: Orthopedics;  Laterality: Right;  . Open Heart Surgery  2010   x 3     reports that he has never smoked. He has never used smokeless tobacco. He reports that he does not drink alcohol or use drugs.   family history includes Diabetes in his brother, father, other, and sister; Hyperlipidemia in his brother, father, and sister; Hypertension in his brother, father, other, and sister.  Outpatient Medications Prior to Visit  Medication Sig Dispense Refill  . colchicine 0.6 MG tablet Take 1 tablet (0.6 mg total) by mouth daily. Take 2 tablets at onset of pain, then may take 1 more 1 hour later if pain continues. 6 tablet 0  . indomethacin (INDOCIN) 50 MG capsule Take 1 capsule (50 mg total) by mouth 3 (three) times daily with meals as needed. 21 capsule 0  . insulin glargine (LANTUS) 100 UNIT/ML injection Inject 24 Units into the skin at bedtime.     . torsemide (DEMADEX) 20 MG tablet Take 2 tablets (40 mg total) by mouth 2 (two) times daily. 120 tablet 4  . aspirin EC 325 MG tablet Take 1 tablet (325 mg total) by mouth 2 (two) times daily. (Patient not taking: Reported on 07/21/2016) 30 tablet 0  .  carvedilol (COREG) 25 MG tablet Take 25 mg by mouth 2 (two) times daily with a meal.    . valsartan (DIOVAN) 40 MG tablet Take 1 tablet (40 mg total) by mouth 2 (two) times daily. (Patient not taking: Reported on 07/21/2016) 60 tablet 4   No facility-administered medications prior to visit.      Allergies: Ibuprofen    PHYSICAL EXAM: VS:  BP (!) 140/96 (BP Location: Left Arm, Patient Position: Sitting, Cuff Size: Large)   Pulse 73   Ht 5\' 9"  (1.753 m)   Wt 269 lb 12 oz (122.4 kg)   BMI 39.84 kg/m  , Body mass index is 39.84 kg/m. Wt Readings from Last 3 Encounters:  07/21/16 269 lb 12 oz (122.4 kg)  06/23/16 281 lb (127.5 kg)  06/07/16 260 lb (117.9 kg)    GENERAL:  well developed, well nourished, obese, not in acute distress HEENT: normocephalic, pink conjunctivae, anicteric sclerae, no xanthelasma, normal dentition, oropharynx clear NECK:  no neck vein engorgement, JVP normal, no hepatojugular reflux, carotid upstroke brisk and symmetric, no bruit, no thyromegaly, no lymphadenopathy LUNGS:  good respiratory effort, clear to auscultation bilaterally CV:  PMI not displaced, no thrills, no lifts, S1 and S2 within normal limits, no palpable S3 or S4, no murmurs, no rubs, no gallops ABD:  Soft, nontender, nondistended, normoactive bowel sounds, no abdominal aortic bruit, no hepatomegaly, no splenomegaly MS: nontender back, no kyphosis, no scoliosis, no joint deformities EXT:  2+ DP/PT pulses, +++dema with chronic venous stasis changes, no cyanosis, no clubbing SKIN: warm, nondiaphoretic, normal turgor, no ulcers NEUROPSYCH: alert, oriented to person, place, and time, sensory/motor grossly intact, normal mood, appropriate affect  Recent Labs: 06/23/2016: BUN 46; Creatinine, Ser 1.80; Potassium 4.9; Sodium 139   Lipid Panel No results found for: CHOL, TRIG, HDL, CHOLHDL, VLDL, LDLCALC, LDLDIRECT   Other studies Reviewed:  EKG:  The ekg from 07/21/2016 was personally reviewed by me  and it revealed sinus rhythm, occasional PVCs. Right bundle-branch block, LAFB, bifascicular block. Nonspecific ST changes. No significant change from 06/23/2016.  Additional studies/ records that were reviewed personally reviewed by me today include:  Echo 07/13/2016: Left ventricle: The cavity size was mildly dilated. Wall   thickness was normal. Systolic function was severely reduced. The   estimated ejection fraction was in the range of 25% to 30%.   Diffuse hypokinesis. There is akinesis of the apical myocardium.   Doppler parameters are consistent with high ventricular filling   pressure. - Mitral valve: Calcified annulus. There was mild regurgitation. - Left atrium: The atrium was mildly dilated. - Right ventricle: Systolic function was moderately reduced. - Right atrium: The  atrium was mildly dilated. - Pulmonary arteries: Systolic pressure was mildly increased. PA   peak pressure: 48 mm Hg (S).  Impressions:  - Global hypokinesis with akinesis of the apex; overall severely   reduced LV function; elevated LV filling pressure; mild MR; mild   biatrial enlargement; moderately reduced RV function; mild TR   with mildly elevated pulmonary pressure.    ASSESSMENT AND PLAN: CHF, systolic dysfunction EF of 25-30%, chronic Mixed ischemic and nonischemic cardiomyopathy Status post Medtronic ICD- follows up with the EP. We will set him up for device clinic here in [place]. Continue medical therapy: Aspirin 81 daily, Aldactone 25 daily, torsemide total 40 mg daily. Pt unwilling to try ACE inhibitor due to episodes of passing out (significant orthostatic changes?). Will likely have similar side effect with ARB. Patient cannot afford ARB anyway. Recommend blood pressure monitoring. If blood pressure not goal less than 130/80, may uptitrate carvedilol to 25 mg twice a day. Patient to call our office with blood pressure log.  Continue daily weights and low sodium diet.  If weight gain  of > 2 lbs over 24 hours, or > 5 lbs over 1 week, please call office.   CAD status post CABG in 2010 We will need to get records of his procedures before. No chest pain. Continue medical therapy. We will need to obtain most recent labs. Likely will need to be on statin therapy.  Hypertension Recommend blood pressure log. Call our office if blood pressure not goal, less than 130/80. We may be able to up titrate carvedilol.  Dietary restriction, salt restriction, lifestyle changes.    Is advised to follow with PCP regarding sleep apnea, patient has been previously referred to sleep clinic.  Current medicines are reviewed at length with the patient today.  The patient does not have concerns regarding medicines.  Labs/ tests ordered today include:  Orders Placed This Encounter  Procedures  . EKG 12-Lead    I had a lengthy and detailed discussion with the patient regarding diagnoses, prognosis, diagnostic options, treatment options , and side effects of medications.   I counseled the patient on importance of lifestyle modification including heart healthy diet, regular physical activity .   Disposition:   FU with Cardiology 3 months  Thank you for this consultation. We will forwarding this consultation to referring physician.   I spent at least 60 minutes with the patient today and more than 50% of the time was spent counseling the patient and coordinating care.   Signed, Wende Bushy, MD  07/21/2016 1:23 PM    [place]  This note was generated in part with voice recognition software and I apologize for any typographical errors that were not detected and corrected.

## 2016-07-21 NOTE — Patient Instructions (Signed)
Follow-Up: Your physician recommends that you schedule a follow-up appointment in: 3 months.   Pt needs set up with our device clinic here in [place].   It was a pleasure seeing you today here in the office. Please do not hesitate to give [place] a call back if you have any further questions. [place], BSN

## 2016-07-28 ENCOUNTER — Ambulatory Visit: Payer: Medicare Other | Admitting: Registered"

## 2016-08-04 ENCOUNTER — Telehealth: Payer: Self-pay | Admitting: *Deleted

## 2016-08-04 DIAGNOSIS — I6529 Occlusion and stenosis of unspecified carotid artery: Secondary | ICD-10-CM

## 2016-08-04 NOTE — Telephone Encounter (Signed)
-----   Message from Wende Bushy, MD sent at 08/01/2016  1:18 PM EDT ----- Very big chart but did not see cabg report  Anyway, there was mention of him having carotid artery stenosis and needing CEA back in 2010 but failed to ffup with surgeon due to move. We can let him know that he likely needs a carotid u/s bilateral and then we can refer him to vascular surgery. Thanks.

## 2016-08-04 NOTE — Telephone Encounter (Signed)
Notified patient of Dr Tora Kindred recommendation. He verbalized understanding and was very Patent attorney. Order entered for carotid dopplers and patient aware someone will call to schedule.

## 2016-08-12 ENCOUNTER — Ambulatory Visit: Payer: Medicare Other | Admitting: Registered"

## 2016-08-17 ENCOUNTER — Ambulatory Visit (HOSPITAL_BASED_OUTPATIENT_CLINIC_OR_DEPARTMENT_OTHER): Payer: Medicare Other | Attending: Cardiology

## 2016-08-21 ENCOUNTER — Inpatient Hospital Stay
Admission: EM | Admit: 2016-08-21 | Discharge: 2016-08-27 | DRG: 253 | Disposition: A | Discharge status: Home Health | Payer: Medicare Other | Attending: Internal Medicine | Admitting: Internal Medicine

## 2016-08-21 ENCOUNTER — Emergency Department: Payer: Medicare Other

## 2016-08-21 DIAGNOSIS — I70261 Atherosclerosis of native arteries of extremities with gangrene, right leg: Secondary | ICD-10-CM | POA: Diagnosis present

## 2016-08-21 DIAGNOSIS — Z95810 Presence of automatic (implantable) cardiac defibrillator: Secondary | ICD-10-CM | POA: Diagnosis not present

## 2016-08-21 DIAGNOSIS — E1140 Type 2 diabetes mellitus with diabetic neuropathy, unspecified: Secondary | ICD-10-CM | POA: Diagnosis present

## 2016-08-21 DIAGNOSIS — I5022 Chronic systolic (congestive) heart failure: Secondary | ICD-10-CM | POA: Diagnosis present

## 2016-08-21 DIAGNOSIS — Z89421 Acquired absence of other right toe(s): Secondary | ICD-10-CM | POA: Diagnosis not present

## 2016-08-21 DIAGNOSIS — Z79899 Other long term (current) drug therapy: Secondary | ICD-10-CM

## 2016-08-21 DIAGNOSIS — M868X7 Other osteomyelitis, ankle and foot: Secondary | ICD-10-CM | POA: Diagnosis present

## 2016-08-21 DIAGNOSIS — R05 Cough: Secondary | ICD-10-CM | POA: Diagnosis not present

## 2016-08-21 DIAGNOSIS — I70238 Atherosclerosis of native arteries of right leg with ulceration of other part of lower right leg: Secondary | ICD-10-CM | POA: Diagnosis not present

## 2016-08-21 DIAGNOSIS — Z945 Skin transplant status: Secondary | ICD-10-CM | POA: Diagnosis not present

## 2016-08-21 DIAGNOSIS — E1152 Type 2 diabetes mellitus with diabetic peripheral angiopathy with gangrene: Principal | ICD-10-CM | POA: Diagnosis present

## 2016-08-21 DIAGNOSIS — I96 Gangrene, not elsewhere classified: Secondary | ICD-10-CM | POA: Diagnosis not present

## 2016-08-21 DIAGNOSIS — L97519 Non-pressure chronic ulcer of other part of right foot with unspecified severity: Secondary | ICD-10-CM | POA: Diagnosis present

## 2016-08-21 DIAGNOSIS — E1165 Type 2 diabetes mellitus with hyperglycemia: Secondary | ICD-10-CM | POA: Diagnosis present

## 2016-08-21 DIAGNOSIS — I7389 Other specified peripheral vascular diseases: Secondary | ICD-10-CM | POA: Diagnosis not present

## 2016-08-21 DIAGNOSIS — I2510 Atherosclerotic heart disease of native coronary artery without angina pectoris: Secondary | ICD-10-CM | POA: Diagnosis present

## 2016-08-21 DIAGNOSIS — Z7982 Long term (current) use of aspirin: Secondary | ICD-10-CM

## 2016-08-21 DIAGNOSIS — B964 Proteus (mirabilis) (morganii) as the cause of diseases classified elsewhere: Secondary | ICD-10-CM | POA: Diagnosis present

## 2016-08-21 DIAGNOSIS — M869 Osteomyelitis, unspecified: Secondary | ICD-10-CM | POA: Diagnosis not present

## 2016-08-21 DIAGNOSIS — E11628 Type 2 diabetes mellitus with other skin complications: Secondary | ICD-10-CM | POA: Diagnosis not present

## 2016-08-21 DIAGNOSIS — G4730 Sleep apnea, unspecified: Secondary | ICD-10-CM | POA: Diagnosis present

## 2016-08-21 DIAGNOSIS — E119 Type 2 diabetes mellitus without complications: Secondary | ICD-10-CM

## 2016-08-21 DIAGNOSIS — M5137 Other intervertebral disc degeneration, lumbosacral region: Secondary | ICD-10-CM | POA: Diagnosis not present

## 2016-08-21 DIAGNOSIS — Z951 Presence of aortocoronary bypass graft: Secondary | ICD-10-CM | POA: Diagnosis not present

## 2016-08-21 DIAGNOSIS — M7989 Other specified soft tissue disorders: Secondary | ICD-10-CM | POA: Diagnosis not present

## 2016-08-21 DIAGNOSIS — R197 Diarrhea, unspecified: Secondary | ICD-10-CM | POA: Diagnosis not present

## 2016-08-21 DIAGNOSIS — L02611 Cutaneous abscess of right foot: Secondary | ICD-10-CM | POA: Diagnosis present

## 2016-08-21 DIAGNOSIS — Z452 Encounter for adjustment and management of vascular access device: Secondary | ICD-10-CM | POA: Diagnosis not present

## 2016-08-21 DIAGNOSIS — I7092 Chronic total occlusion of artery of the extremities: Secondary | ICD-10-CM | POA: Diagnosis present

## 2016-08-21 DIAGNOSIS — I255 Ischemic cardiomyopathy: Secondary | ICD-10-CM | POA: Diagnosis present

## 2016-08-21 DIAGNOSIS — M79671 Pain in right foot: Secondary | ICD-10-CM | POA: Diagnosis not present

## 2016-08-21 DIAGNOSIS — I10 Essential (primary) hypertension: Secondary | ICD-10-CM | POA: Diagnosis present

## 2016-08-21 DIAGNOSIS — L089 Local infection of the skin and subcutaneous tissue, unspecified: Secondary | ICD-10-CM | POA: Diagnosis present

## 2016-08-21 DIAGNOSIS — M87877 Other osteonecrosis, right toe(s): Secondary | ICD-10-CM | POA: Diagnosis not present

## 2016-08-21 DIAGNOSIS — M109 Gout, unspecified: Secondary | ICD-10-CM | POA: Diagnosis present

## 2016-08-21 DIAGNOSIS — Z48812 Encounter for surgical aftercare following surgery on the circulatory system: Secondary | ICD-10-CM | POA: Diagnosis not present

## 2016-08-21 DIAGNOSIS — E1169 Type 2 diabetes mellitus with other specified complication: Secondary | ICD-10-CM | POA: Diagnosis present

## 2016-08-21 DIAGNOSIS — R059 Cough, unspecified: Secondary | ICD-10-CM

## 2016-08-21 DIAGNOSIS — N179 Acute kidney failure, unspecified: Secondary | ICD-10-CM | POA: Diagnosis not present

## 2016-08-21 DIAGNOSIS — Z8673 Personal history of transient ischemic attack (TIA), and cerebral infarction without residual deficits: Secondary | ICD-10-CM | POA: Diagnosis not present

## 2016-08-21 DIAGNOSIS — E1122 Type 2 diabetes mellitus with diabetic chronic kidney disease: Secondary | ICD-10-CM | POA: Diagnosis present

## 2016-08-21 DIAGNOSIS — Z794 Long term (current) use of insulin: Secondary | ICD-10-CM | POA: Diagnosis not present

## 2016-08-21 DIAGNOSIS — E11621 Type 2 diabetes mellitus with foot ulcer: Secondary | ICD-10-CM | POA: Diagnosis present

## 2016-08-21 DIAGNOSIS — I130 Hypertensive heart and chronic kidney disease with heart failure and stage 1 through stage 4 chronic kidney disease, or unspecified chronic kidney disease: Secondary | ICD-10-CM | POA: Diagnosis present

## 2016-08-21 DIAGNOSIS — T368X5A Adverse effect of other systemic antibiotics, initial encounter: Secondary | ICD-10-CM | POA: Diagnosis not present

## 2016-08-21 DIAGNOSIS — Z4781 Encounter for orthopedic aftercare following surgical amputation: Secondary | ICD-10-CM | POA: Diagnosis not present

## 2016-08-21 DIAGNOSIS — N183 Chronic kidney disease, stage 3 (moderate): Secondary | ICD-10-CM | POA: Diagnosis present

## 2016-08-21 DIAGNOSIS — B9689 Other specified bacterial agents as the cause of diseases classified elsewhere: Secondary | ICD-10-CM | POA: Diagnosis present

## 2016-08-21 DIAGNOSIS — I509 Heart failure, unspecified: Secondary | ICD-10-CM | POA: Diagnosis not present

## 2016-08-21 DIAGNOSIS — I110 Hypertensive heart disease with heart failure: Secondary | ICD-10-CM | POA: Diagnosis not present

## 2016-08-21 LAB — COMPREHENSIVE METABOLIC PANEL
ALBUMIN: 3.1 g/dL — AB (ref 3.5–5.0)
ALT: 21 U/L (ref 17–63)
AST: 35 U/L (ref 15–41)
Alkaline Phosphatase: 133 U/L — ABNORMAL HIGH (ref 38–126)
Anion gap: 9 (ref 5–15)
BUN: 45 mg/dL — ABNORMAL HIGH (ref 6–20)
CHLORIDE: 102 mmol/L (ref 101–111)
CO2: 22 mmol/L (ref 22–32)
CREATININE: 2.03 mg/dL — AB (ref 0.61–1.24)
Calcium: 8.4 mg/dL — ABNORMAL LOW (ref 8.9–10.3)
GFR calc Af Amer: 42 mL/min — ABNORMAL LOW (ref >60)
GFR, EST NON AFRICAN AMERICAN: 36 mL/min — AB (ref >60)
GLUCOSE: 262 mg/dL — AB (ref 65–99)
Potassium: 3.8 mmol/L (ref 3.5–5.1)
SODIUM: 133 mmol/L — AB (ref 135–145)
Total Bilirubin: 0.8 mg/dL (ref 0.3–1.2)
Total Protein: 7.4 g/dL (ref 6.5–8.1)

## 2016-08-21 LAB — LACTIC ACID, PLASMA: Lactic Acid, Venous: 1.1 mmol/L (ref 0.5–1.9)

## 2016-08-21 MED ORDER — MORPHINE SULFATE (PF) 4 MG/ML IV SOLN
4.0000 mg | Freq: Once | INTRAVENOUS | Status: AC
  Administered 2016-08-21: 4 mg via INTRAVENOUS
  Filled 2016-08-21: qty 1

## 2016-08-21 NOTE — ED Notes (Signed)
Pt states he had pedicure x 1 week ago, 3 days had increased pain in R foot and leg. Pt states pain was bad enough today he was sent home from work. Pt has purulent drainage, darkened skin and open wounds to R 3 and 4 toe. Pt has 2+ DP. Pt has 3+ brawny edema to bilateral lower extremities.

## 2016-08-21 NOTE — ED Triage Notes (Signed)
Pt. Triaged in wheelchair. Pt.right  3rd and 4th toes are black, foul smell, and drainage. He noticed it today. Pt. Alert and speech clear.

## 2016-08-21 NOTE — ED Provider Notes (Signed)
[facility] Emergency Department Provider Note  ____________________________________________   I have reviewed the triage vital signs and the nursing notes.   HISTORY  Chief Complaint Foot Pain    HPI Paul [name]. is a 51 y.o. male who presents today complaining of darkened third and fourth toe on the left. 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 Patient denies any fever or trauma. Does have poorly controlled diabetes.       Past Medical History:  Diagnosis Date  . AICD (automatic cardioverter/defibrillator) present   . Cardiac defibrillator in place   . CHF (congestive heart failure) (HCC)    takes Torsemide daily  . CHF (congestive heart failure) ([place])   . Coronary artery disease   . DDD (degenerative disc disease), lumbosacral    L5-S1  . Diabetes ([place])    Lantus at bedtime  . Hypertension    takes Coreg daily  . Sleep apnea    sleep study yr ago-unable to afford cpap  . Stroke [facility]) 09   no weakness    Patient Active Problem List   Diagnosis Date Noted  . Gangrene of foot ([place]) 08/21/2016  . HTN (hypertension) 08/21/2016  . Diabetes ([place]) 08/21/2016  . CAD (coronary artery disease) 08/21/2016  . Chronic systolic CHF (congestive heart failure) ([place]) 08/21/2016  . Diabetic foot infection ([place]) 08/21/2016  . Heterotopic ossification of bone 07/12/2014    Past Surgical History:  Procedure Laterality Date  . CARDIAC DEFIBRILLATOR PLACEMENT  2011  . CHOLECYSTECTOMY  2010  . CORONARY ARTERY BYPASS GRAFT  2010   CABG x 3   . HIP SURGERY Right 1994  . MASS EXCISION Right 07/18/2014   Procedure: EXCISION HETEROTOPIC BONE RIGHT HIP;  Surgeon: Frederik Pear, MD;  Location: [facility];  Service: Orthopedics;   Laterality: Right;  . Open Heart Surgery  2010   x 3    Prior to Admission medications   Medication Sig Start Date End Date Taking? Authorizing Provider  aspirin 81 MG tablet Take 81 mg by mouth daily.   Yes Historical Provider, MD  carvedilol (COREG) 12.5 MG tablet Take 12.5 mg by mouth 2 (two) times daily with a meal.   Yes Historical Provider, MD  indomethacin (INDOCIN) 50 MG capsule Take 1 capsule (50 mg total) by mouth 3 (three) times daily with meals as needed. Patient taking differently: Take 50 mg by mouth 3 (three) times daily with meals as needed for mild pain.  05/14/16  Yes Laban Emperor, PA-C  insulin glargine (LANTUS) 100 UNIT/ML injection Inject 24 Units into the skin at bedtime.    Yes Historical Provider, MD  spironolactone (ALDACTONE) 25 MG tablet Take 25 mg by mouth daily.   Yes Historical Provider, MD  torsemide (DEMADEX) 20 MG tablet Take 2 tablets (40 mg total) by mouth 2 (two) times daily. 06/23/16  Yes Will Meredith Leeds, MD    Allergies Ibuprofen  Family History  Problem Relation Age of Onset  . Hypertension Other   . Diabetes Other   . Diabetes Father   . Hyperlipidemia Father   . Hypertension Father   . Hyperlipidemia Sister   . Hypertension Sister   . Diabetes Sister   .  Diabetes Brother   . Hypertension Brother   . Hyperlipidemia Brother     Social History Social History  Substance Use Topics  . Smoking status: Never Smoker  . Smokeless tobacco: Never Used  . Alcohol use No    Review of Systems Constitutional: No fever/chills Eyes: No visual changes. ENT: No sore throat. No stiff neck no neck pain Cardiovascular: Denies chest pain. Respiratory: Denies shortness of breath. Gastrointestinal:   no vomiting.  No diarrhea.  No constipation. Genitourinary: Negative for dysuria. Musculoskeletal: Negative lower extremity swelling Skin: see hpi Neurological: Negative for severe headaches, focal weakness or numbness. 10-point ROS otherwise  negative.  ____________________________________________   PHYSICAL EXAM:  VITAL SIGNS: ED Triage Vitals  Enc Vitals Group     BP 08/21/16 2031 (!) 103/57     Pulse Rate 08/21/16 2031 93     Resp 08/21/16 2031 18     Temp 08/21/16 2031 98.2 F (36.8 C)     Temp Source 08/21/16 2031 Oral     SpO2 08/21/16 2031 93 %     Weight 08/21/16 2031 265 lb (120.2 kg)     Height 08/21/16 2031 5\' 10"  (1.778 m)     Head Circumference --      Peak Flow --      Pain Score 08/21/16 2030 10     Pain Loc --      Pain Edu? --      Excl. in [place]? --     Constitutional: Alert and oriented. Well appearing and in no acute distress. Eyes: Conjunctivae are normal. PERRL. EOMI. Head: Atraumatic. Nose: No congestion/rhinnorhea. Mouth/Throat: Mucous membranes are moist.  Oropharynx non-erythematous. Neck: No stridor.   Nontender with no meningismus Cardiovascular: Normal rate, regular rhythm. Grossly normal heart sounds.  Good peripheral circulation. Respiratory: Normal respiratory effort.  No retractions. Lungs CTAB. Abdominal: Soft and nontender. No distention. No guarding no rebound Back:  There is no focal tenderness or step off.  there is no midline tenderness there are no lesions noted. there is no CVA tenderness Musculoskeletal: No lower extremity tenderness, no upper extremity tenderness. No joint effusions, no DVT signs Feet are both well perfused and warm, dorsal pedal pulses are present bilaterally and do not palpate posterior tibial very well but there is significant chronic appearing edema. Third and fourth toes on the right are ischemic, there is a foul odor, there is some drainage which is serosanguineous. There is no surrounding cellulitic changes going up the foot or lymphogenic spread. Neurologic:  Normal speech and language. No gross focal neurologic deficits are appreciated.  Skin:  Skin is warm, dry and intact. See above. Psychiatric: Mood and affect are normal. Speech and behavior are  normal.  ____________________________________________   LABS (all labs ordered are listed, but only abnormal results are displayed)  Labs Reviewed  COMPREHENSIVE METABOLIC PANEL - Abnormal; Notable for the following:       Result Value   Sodium 133 (*)    Glucose, Bld 262 (*)    BUN 45 (*)    Creatinine, Ser 2.03 (*)    Calcium 8.4 (*)    Albumin 3.1 (*)    Alkaline Phosphatase 133 (*)    GFR calc non Af Amer 36 (*)    GFR calc Af Amer 42 (*)    All other components within normal limits  CULTURE, BLOOD (ROUTINE X 2)  CULTURE, BLOOD (ROUTINE X 2)  CBC WITH DIFFERENTIAL/PLATELET  LACTIC ACID, PLASMA  LACTIC ACID, PLASMA  ____________________________________________  EKG  I personally interpreted any EKGs ordered by me or triage Sinus rhythm, RBBB LAFB, rate 82, ____________________________________________  RADIOLOGY  I reviewed any imaging ordered by me or triage that were performed during my shift and, if possible, patient and/or family made aware of any abnormal findings. ____________________________________________   PROCEDURES  Procedure(s) performed: None  Procedures  Critical Care performed: None  ____________________________________________   INITIAL IMPRESSION / ASSESSMENT AND PLAN / ED COURSE  Pertinent labs & imaging results that were available during my care of the patient were reviewed by me and considered in my medical decision making (see chart for details).  Administration here with gangrenous toes, third and fourth, has pulses warm and well perfused. Request hospitals I discussed the vascular surgery, they feel this is most likely diabetic and I agree. We are giving the patient vancomycin and Zosyn and patient will be admitted to the hospital for further evaluation.    ____________________________________________   FINAL CLINICAL IMPRESSION(S) / ED DIAGNOSES  Final diagnoses:  Gangrene of toe of right foot ([place])      This chart was  dictated using voice recognition software.  Despite best efforts to proofread,  errors can occur which can change meaning.      Schuyler Amor, MD 08/21/16 [phone]

## 2016-08-21 NOTE — H&P (Signed)
[place] at [place] NAME: Paul [name]    MR#:  [id number]  DATE OF BIRTH:  1966/05/01  DATE OF ADMISSION:  08/21/2016  PRIMARY CARE PHYSICIAN: Kandice Hams, MD   REQUESTING/REFERRING PHYSICIAN: Burlene Arnt, MD  CHIEF COMPLAINT:   Chief Complaint  Patient presents with  . Foot Pain    HISTORY OF PRESENT ILLNESS:  Paul [name]  is a 51 y.o. male who presents with 4 days increasing right foot pain.  He then noticed discoloration of two toes, and came to ED for evaluation.  Here found to have likely diabetic foot infection.  Hospitalists called for admission  PAST MEDICAL HISTORY:   Past Medical History:  Diagnosis Date  . AICD (automatic cardioverter/defibrillator) present   . Cardiac defibrillator in place   . CHF (congestive heart failure) (HCC)    takes Torsemide daily  . CHF (congestive heart failure) ([place])   . Coronary artery disease   . DDD (degenerative disc disease), lumbosacral    L5-S1  . Diabetes ([place])    Lantus at bedtime  . Hypertension    takes Coreg daily  . Sleep apnea    sleep study yr ago-unable to afford cpap  . Stroke [facility]) 09   no weakness    PAST SURGICAL HISTORY:   Past Surgical History:  Procedure Laterality Date  . CARDIAC DEFIBRILLATOR PLACEMENT  2011  . CHOLECYSTECTOMY  2010  . CORONARY ARTERY BYPASS GRAFT  2010   CABG x 3   . HIP SURGERY Right 1994  . MASS EXCISION Right 07/18/2014   Procedure: EXCISION HETEROTOPIC BONE RIGHT HIP;  Surgeon: Frederik Pear, MD;  Location: [facility];  Service: Orthopedics;  Laterality: Right;  . Open Heart Surgery  2010   x 3    SOCIAL HISTORY:   Social History  Substance Use Topics  . Smoking status: Never Smoker  . Smokeless tobacco: Never Used  . Alcohol use No    FAMILY HISTORY:   Family History  Problem Relation Age of Onset  . Hypertension Other   . Diabetes Other   . Diabetes Father   . Hyperlipidemia Father   . Hypertension Father   .  Hyperlipidemia Sister   . Hypertension Sister   . Diabetes Sister   . Diabetes Brother   . Hypertension Brother   . Hyperlipidemia Brother     DRUG ALLERGIES:   Allergies  Allergen Reactions  . Ibuprofen Other (See Comments)    Heart problems    MEDICATIONS AT HOME:   Prior to Admission medications   Medication Sig Start Date End Date Taking? Authorizing Provider  aspirin 81 MG tablet Take 81 mg by mouth daily.   Yes Historical Provider, MD  carvedilol (COREG) 12.5 MG tablet Take 12.5 mg by mouth 2 (two) times daily with a meal.   Yes Historical Provider, MD  indomethacin (INDOCIN) 50 MG capsule Take 1 capsule (50 mg total) by mouth 3 (three) times daily with meals as needed. Patient taking differently: Take 50 mg by mouth 3 (three) times daily with meals as needed for mild pain.  05/14/16  Yes Laban Emperor, PA-C  insulin glargine (LANTUS) 100 UNIT/ML injection Inject 24 Units into the skin at bedtime.    Yes Historical Provider, MD  spironolactone (ALDACTONE) 25 MG tablet Take 25 mg by mouth daily.   Yes Historical Provider, MD  torsemide (DEMADEX) 20 MG tablet Take 2 tablets (40 mg total) by mouth 2 (two) times daily.  06/23/16  Yes Will Meredith Leeds, MD    REVIEW OF SYSTEMS:  Review of Systems  Constitutional: Negative for chills, fever, malaise/fatigue and weight loss.  HENT: Negative for ear pain, hearing loss and tinnitus.   Eyes: Negative for blurred vision, double vision, pain and redness.  Respiratory: Negative for cough, hemoptysis and shortness of breath.   Cardiovascular: Negative for chest pain, palpitations, orthopnea and leg swelling.  Gastrointestinal: Negative for abdominal pain, constipation, diarrhea, nausea and vomiting.  Genitourinary: Negative for dysuria, frequency and hematuria.  Musculoskeletal: Positive for joint pain (right foot). Negative for back pain and neck pain.  Skin:       No acne, rash, or lesions  Neurological: Negative for dizziness,  tremors, focal weakness and weakness.  Endo/Heme/Allergies: Negative for polydipsia. Does not bruise/bleed easily.  Psychiatric/Behavioral: Negative for depression. The patient is not nervous/anxious and does not have insomnia.      VITAL SIGNS:   Vitals:   08/21/16 2031 08/21/16 2045 08/21/16 2100 08/21/16 2308  BP: (!) 103/57 (!) 108/39 (!) 110/47 113/67  Pulse: 93 87 84 85  Resp: 18   20  Temp: 98.2 F (36.8 C)     TempSrc: Oral     SpO2: 93% 97% 97% 100%  Weight: 120.2 kg (265 lb)     Height: 5\' 10"  (1.778 m)      Wt Readings from Last 3 Encounters:  08/21/16 120.2 kg (265 lb)  07/21/16 122.4 kg (269 lb 12 oz)  06/23/16 127.5 kg (281 lb)    PHYSICAL EXAMINATION:  Physical Exam  Vitals reviewed. Constitutional: He is oriented to person, place, and time. He appears well-developed and well-nourished. No distress.  HENT:  Head: Normocephalic and atraumatic.  Mouth/Throat: Oropharynx is clear and moist.  Eyes: Conjunctivae and EOM are normal. Pupils are equal, round, and reactive to light. No scleral icterus.  Neck: Normal range of motion. Neck supple. No JVD present. No thyromegaly present.  Cardiovascular: Normal rate, regular rhythm and intact distal pulses.  Exam reveals no gallop and no friction rub.   No murmur heard. Respiratory: Effort normal and breath sounds normal. No respiratory distress. He has no wheezes. He has no rales.  GI: Soft. Bowel sounds are normal. He exhibits no distension. There is no tenderness.  Musculoskeletal: Normal range of motion. He exhibits tenderness. He exhibits no edema.  3rd and 4th toes right foot discolored and swollen, small necrotic area on plantar foot at base of toes.  Lymphadenopathy:    He has no cervical adenopathy.  Neurological: He is alert and oriented to person, place, and time. No cranial nerve deficit.  No dysarthria, no aphasia  Skin: Skin is warm and dry. No rash noted. No erythema.  Psychiatric: He has a normal mood  and affect. His behavior is normal. Judgment and thought content normal.    LABORATORY PANEL:   CBC No results for input(s): WBC, HGB, HCT, PLT in the last 168 hours. ------------------------------------------------------------------------------------------------------------------  Chemistries   Recent Labs Lab 08/21/16 2306  NA 133*  K 3.8  CL 102  CO2 22  GLUCOSE 262*  BUN 45*  CREATININE 2.03*  CALCIUM 8.4*  AST 35  ALT 21  ALKPHOS 133*  BILITOT 0.8   ------------------------------------------------------------------------------------------------------------------  Cardiac Enzymes No results for input(s): TROPONINI in the last 168 hours. ------------------------------------------------------------------------------------------------------------------  RADIOLOGY:  Dg Foot Complete Right  Result Date: 08/21/2016 CLINICAL DATA:  Weeping sores to the bottom of the right foot and third and fourth digits beginning 3  days ago. EXAM: RIGHT FOOT COMPLETE - 3+ VIEW COMPARISON:  03/29/2014 FINDINGS: Right foot appears intact No evidence of acute fracture or subluxation. No focal bone lesion or bone destruction. Bone cortex and trabecular architecture appear intact. No radiopaque soft tissue foreign bodies. No soft tissue gas collections. Mild dorsal soft tissue swelling. Prominent vascular calcifications. IMPRESSION: Dorsal soft tissue swelling. No radiopaque foreign bodies or gas collections. No acute bony abnormalities. Electronically Signed   By: Lucienne Capers M.D.   On: 08/21/2016 22:14    EKG:   Orders placed or performed during the hospital encounter of 08/21/16  . ED EKG  . ED EKG    IMPRESSION AND PLAN:  Principal Problem:   Diabetic foot infection ([place]) - IV antibiotics, podiatry consult Active Problems:   HTN (hypertension) - home meds   Diabetes ([place]) - SSI and glucose checks   CAD (coronary artery disease) - home meds   Chronic systolic CHF (congestive  heart failure) ([place]) - home meds    All the records are reviewed and case discussed with ED provider. Management plans discussed with the patient and/or family.  DVT PROPHYLAXIS: SubQ heparin  GI PROPHYLAXIS: None  ADMISSION STATUS: Inpatient  CODE STATUS: Full Code Status History    Date Active Date Inactive Code Status Order ID Comments User Context   07/18/2014  6:18 PM 07/20/2014  8:02 PM Full Code [id number]  Leighton Parody, PA-C Inpatient      TOTAL TIME TAKING CARE OF THIS PATIENT: 45 minutes.   Rhena Glace FIELDING 08/21/2016, 11:39 PM  [facility]  Office  [phone]  CC: Primary care physician; Kandice Hams, MD  Note:  This document was prepared using Dragon voice recognition software and may include unintentional dictation errors.

## 2016-08-22 ENCOUNTER — Encounter
Admission: EM | Disposition: A | Discharge status: Home Health | Payer: Self-pay | Source: Home / Self Care | Attending: Internal Medicine

## 2016-08-22 ENCOUNTER — Inpatient Hospital Stay: Payer: Medicare Other | Admitting: Anesthesiology

## 2016-08-22 DIAGNOSIS — I96 Gangrene, not elsewhere classified: Secondary | ICD-10-CM

## 2016-08-22 DIAGNOSIS — I10 Essential (primary) hypertension: Secondary | ICD-10-CM

## 2016-08-22 DIAGNOSIS — I5022 Chronic systolic (congestive) heart failure: Secondary | ICD-10-CM

## 2016-08-22 HISTORY — PX: AMPUTATION TOE: SHX6595

## 2016-08-22 HISTORY — PX: INCISION AND DRAINAGE OF WOUND: SHX1803

## 2016-08-22 LAB — CBC WITH DIFFERENTIAL/PLATELET
BASOS ABS: 0.1 10*3/uL (ref 0–0.1)
Basophils Relative: 0 %
Eosinophils Absolute: 0 10*3/uL (ref 0–0.7)
Eosinophils Relative: 0 %
HEMATOCRIT: 30.9 % — AB (ref 40.0–52.0)
HEMOGLOBIN: 9.9 g/dL — AB (ref 13.0–18.0)
LYMPHS PCT: 6 %
Lymphs Abs: 1.3 10*3/uL (ref 1.0–3.6)
MCH: 27.3 pg (ref 26.0–34.0)
MCHC: 32.2 g/dL (ref 32.0–36.0)
MCV: 84.9 fL (ref 80.0–100.0)
MONO ABS: 2.2 10*3/uL — AB (ref 0.2–1.0)
Monocytes Relative: 10 %
NEUTROS ABS: 18.8 10*3/uL — AB (ref 1.4–6.5)
Neutrophils Relative %: 84 %
PLATELETS: 203 10*3/uL (ref 150–440)
RBC: 3.63 MIL/uL — AB (ref 4.40–5.90)
RDW: 15.4 % — AB (ref 11.5–14.5)
WBC: 22.3 10*3/uL — ABNORMAL HIGH (ref 3.8–10.6)

## 2016-08-22 LAB — BASIC METABOLIC PANEL
ANION GAP: 10 (ref 5–15)
BUN: 45 mg/dL — ABNORMAL HIGH (ref 6–20)
CALCIUM: 8.4 mg/dL — AB (ref 8.9–10.3)
CHLORIDE: 104 mmol/L (ref 101–111)
CO2: 21 mmol/L — AB (ref 22–32)
Creatinine, Ser: 1.96 mg/dL — ABNORMAL HIGH (ref 0.61–1.24)
GFR calc Af Amer: 44 mL/min — ABNORMAL LOW (ref >60)
GFR calc non Af Amer: 38 mL/min — ABNORMAL LOW (ref >60)
GLUCOSE: 243 mg/dL — AB (ref 65–99)
Potassium: 3.9 mmol/L (ref 3.5–5.1)
Sodium: 135 mmol/L (ref 135–145)

## 2016-08-22 LAB — CBC
HCT: 30.7 % — ABNORMAL LOW (ref 40.0–52.0)
Hemoglobin: 10 g/dL — ABNORMAL LOW (ref 13.0–18.0)
MCH: 27.5 pg (ref 26.0–34.0)
MCHC: 32.5 g/dL (ref 32.0–36.0)
MCV: 84.8 fL (ref 80.0–100.0)
PLATELETS: 200 10*3/uL (ref 150–440)
RBC: 3.62 MIL/uL — ABNORMAL LOW (ref 4.40–5.90)
RDW: 15.4 % — AB (ref 11.5–14.5)
WBC: 17.8 10*3/uL — ABNORMAL HIGH (ref 3.8–10.6)

## 2016-08-22 LAB — GLUCOSE, CAPILLARY
GLUCOSE-CAPILLARY: 120 mg/dL — AB (ref 65–99)
GLUCOSE-CAPILLARY: 154 mg/dL — AB (ref 65–99)
GLUCOSE-CAPILLARY: 196 mg/dL — AB (ref 65–99)
GLUCOSE-CAPILLARY: 228 mg/dL — AB (ref 65–99)
Glucose-Capillary: 198 mg/dL — ABNORMAL HIGH (ref 65–99)

## 2016-08-22 LAB — SURGICAL PCR SCREEN
MRSA, PCR: NEGATIVE
Staphylococcus aureus: NEGATIVE

## 2016-08-22 SURGERY — AMPUTATION, TOE
Anesthesia: General | Laterality: Right

## 2016-08-22 MED ORDER — ONDANSETRON HCL 4 MG PO TABS: 4.0000 mg | ORAL_TABLET | Freq: Four times a day (QID) | ORAL | Status: DC | PRN

## 2016-08-22 MED ORDER — MORPHINE SULFATE (PF) 2 MG/ML IV SOLN: 2.0000 mg | INTRAVENOUS | Status: DC | PRN

## 2016-08-22 MED ORDER — VANCOMYCIN HCL 10 G IV SOLR
1500.0000 mg | INTRAVENOUS | Status: DC
  Administered 2016-08-22 – 2016-08-24 (×4): 1500 mg via INTRAVENOUS
  Filled 2016-08-22 (×5): qty 1500

## 2016-08-22 MED ORDER — CHLORHEXIDINE GLUCONATE 4 % EX LIQD
60.0000 mL | Freq: Once | CUTANEOUS | Status: AC
  Administered 2016-08-22: 4 via TOPICAL

## 2016-08-22 MED ORDER — LIDOCAINE-EPINEPHRINE 1 %-1:100000 IJ SOLN
INTRAMUSCULAR | Status: AC
  Filled 2016-08-22: qty 1

## 2016-08-22 MED ORDER — SPIRONOLACTONE 25 MG PO TABS
25.0000 mg | ORAL_TABLET | Freq: Every day | ORAL | Status: DC
  Administered 2016-08-22 – 2016-08-25 (×4): 25 mg via ORAL
  Filled 2016-08-22 (×4): qty 1

## 2016-08-22 MED ORDER — ACETAMINOPHEN 325 MG PO TABS
650.0000 mg | ORAL_TABLET | Freq: Four times a day (QID) | ORAL | Status: DC | PRN
  Administered 2016-08-22 – 2016-08-23 (×2): 650 mg via ORAL
  Filled 2016-08-22 (×2): qty 2

## 2016-08-22 MED ORDER — PIPERACILLIN-TAZOBACTAM 4.5 G IVPB
4.5000 g | Freq: Three times a day (TID) | INTRAVENOUS | Status: DC
  Administered 2016-08-22 – 2016-08-27 (×15): 4.5 g via INTRAVENOUS
  Filled 2016-08-22 (×19): qty 100

## 2016-08-22 MED ORDER — MORPHINE SULFATE (PF) 2 MG/ML IV SOLN
2.0000 mg | INTRAVENOUS | Status: DC | PRN
  Administered 2016-08-22 – 2016-08-26 (×3): 2 mg via INTRAVENOUS
  Filled 2016-08-22 (×2): qty 1

## 2016-08-22 MED ORDER — ONDANSETRON HCL 4 MG/2ML IJ SOLN: 4.0000 mg | Freq: Four times a day (QID) | INTRAMUSCULAR | Status: DC | PRN

## 2016-08-22 MED ORDER — FENTANYL CITRATE (PF) 100 MCG/2ML IJ SOLN
INTRAMUSCULAR | Status: AC
  Filled 2016-08-22: qty 2

## 2016-08-22 MED ORDER — ONDANSETRON HCL 4 MG/2ML IJ SOLN: 4.0000 mg | Freq: Once | INTRAMUSCULAR | Status: DC | PRN

## 2016-08-22 MED ORDER — OXYCODONE HCL 5 MG PO TABS
5.0000 mg | ORAL_TABLET | ORAL | Status: DC | PRN
  Administered 2016-08-22 – 2016-08-27 (×16): 5 mg via ORAL
  Filled 2016-08-22 (×16): qty 1

## 2016-08-22 MED ORDER — LIDOCAINE HCL (PF) 1 % IJ SOLN
INTRAMUSCULAR | Status: AC
  Filled 2016-08-22: qty 30

## 2016-08-22 MED ORDER — CARVEDILOL 12.5 MG PO TABS
12.5000 mg | ORAL_TABLET | Freq: Two times a day (BID) | ORAL | Status: DC
  Administered 2016-08-22 – 2016-08-27 (×10): 12.5 mg via ORAL
  Filled 2016-08-22 (×10): qty 1

## 2016-08-22 MED ORDER — LACTATED RINGERS IV SOLN
INTRAVENOUS | Status: DC | PRN
  Administered 2016-08-22: 16:00:00 via INTRAVENOUS

## 2016-08-22 MED ORDER — FENTANYL CITRATE (PF) 100 MCG/2ML IJ SOLN
INTRAMUSCULAR | Status: DC | PRN
  Administered 2016-08-22: 50 ug via INTRAVENOUS

## 2016-08-22 MED ORDER — ASPIRIN 81 MG PO CHEW
81.0000 mg | CHEWABLE_TABLET | Freq: Every day | ORAL | Status: DC
  Administered 2016-08-22 – 2016-08-27 (×6): 81 mg via ORAL
  Filled 2016-08-22 (×6): qty 1

## 2016-08-22 MED ORDER — BUPIVACAINE HCL 0.5 % IJ SOLN
INTRAMUSCULAR | Status: DC | PRN
  Administered 2016-08-22: 10 mL

## 2016-08-22 MED ORDER — ACETAMINOPHEN 650 MG RE SUPP: 650.0000 mg | Freq: Four times a day (QID) | RECTAL | Status: DC | PRN

## 2016-08-22 MED ORDER — INSULIN ASPART 100 UNIT/ML ~~LOC~~ SOLN
0.0000 [IU] | Freq: Every day | SUBCUTANEOUS | Status: DC
  Administered 2016-08-22: 2 [IU] via SUBCUTANEOUS
  Administered 2016-08-23: 3 [IU] via SUBCUTANEOUS
  Filled 2016-08-22: qty 3
  Filled 2016-08-22: qty 2

## 2016-08-22 MED ORDER — MIDAZOLAM HCL 2 MG/2ML IJ SOLN
INTRAMUSCULAR | Status: DC | PRN
  Administered 2016-08-22: 1 mg via INTRAVENOUS

## 2016-08-22 MED ORDER — PROPOFOL 10 MG/ML IV BOLUS
INTRAVENOUS | Status: DC | PRN
  Administered 2016-08-22: 150 mg via INTRAVENOUS

## 2016-08-22 MED ORDER — INSULIN ASPART 100 UNIT/ML ~~LOC~~ SOLN
0.0000 [IU] | Freq: Three times a day (TID) | SUBCUTANEOUS | Status: DC
  Administered 2016-08-22 (×2): 2 [IU] via SUBCUTANEOUS
  Administered 2016-08-23: 3 [IU] via SUBCUTANEOUS
  Administered 2016-08-23: 1 [IU] via SUBCUTANEOUS
  Administered 2016-08-24: 7 [IU] via SUBCUTANEOUS
  Administered 2016-08-24: 5 [IU] via SUBCUTANEOUS
  Administered 2016-08-24 – 2016-08-25 (×3): 2 [IU] via SUBCUTANEOUS
  Administered 2016-08-25: 3 [IU] via SUBCUTANEOUS
  Administered 2016-08-26 (×2): 1 [IU] via SUBCUTANEOUS
  Filled 2016-08-22: qty 1
  Filled 2016-08-22: qty 5
  Filled 2016-08-22 (×2): qty 1
  Filled 2016-08-22: qty 7
  Filled 2016-08-22: qty 3
  Filled 2016-08-22: qty 2
  Filled 2016-08-22: qty 3
  Filled 2016-08-22 (×4): qty 2

## 2016-08-22 MED ORDER — VANCOMYCIN HCL 10 G IV SOLR
1500.0000 mg | Freq: Once | INTRAVENOUS | Status: AC
  Administered 2016-08-22: 1500 mg via INTRAVENOUS
  Filled 2016-08-22: qty 1500

## 2016-08-22 MED ORDER — BUPIVACAINE HCL (PF) 0.5 % IJ SOLN
INTRAMUSCULAR | Status: AC
  Filled 2016-08-22: qty 30

## 2016-08-22 MED ORDER — HEPARIN SODIUM (PORCINE) 5000 UNIT/ML IJ SOLN
5000.0000 [IU] | Freq: Three times a day (TID) | INTRAMUSCULAR | Status: DC
  Administered 2016-08-22 – 2016-08-27 (×14): 5000 [IU] via SUBCUTANEOUS
  Filled 2016-08-22 (×14): qty 1

## 2016-08-22 MED ORDER — LIDOCAINE HCL 1 % IJ SOLN
INTRAMUSCULAR | Status: DC | PRN
  Administered 2016-08-22: 10 mL

## 2016-08-22 MED ORDER — FUROSEMIDE 10 MG/ML IJ SOLN
40.0000 mg | Freq: Once | INTRAMUSCULAR | Status: AC
  Administered 2016-08-22: 40 mg via INTRAVENOUS
  Filled 2016-08-22: qty 4

## 2016-08-22 MED ORDER — ONDANSETRON HCL 4 MG/2ML IJ SOLN
INTRAMUSCULAR | Status: DC | PRN
  Administered 2016-08-22: 4 mg via INTRAVENOUS

## 2016-08-22 MED ORDER — TORSEMIDE 20 MG PO TABS
40.0000 mg | ORAL_TABLET | Freq: Two times a day (BID) | ORAL | Status: DC
  Administered 2016-08-22 – 2016-08-25 (×7): 40 mg via ORAL
  Filled 2016-08-22 (×7): qty 2

## 2016-08-22 MED ORDER — FENTANYL CITRATE (PF) 100 MCG/2ML IJ SOLN
25.0000 ug | INTRAMUSCULAR | Status: AC | PRN
  Administered 2016-08-22 (×6): 25 ug via INTRAVENOUS

## 2016-08-22 MED ORDER — MIDAZOLAM HCL 2 MG/2ML IJ SOLN
INTRAMUSCULAR | Status: AC
  Filled 2016-08-22: qty 2

## 2016-08-22 SURGICAL SUPPLY — 72 items
BANDAGE ACE 4X5 VEL STRL LF (GAUZE/BANDAGES/DRESSINGS) ×2 IMPLANT
BANDAGE ELASTIC 4 LF NS (GAUZE/BANDAGES/DRESSINGS) ×2 IMPLANT
BANDAGE STRETCH 3X4.1 STRL (GAUZE/BANDAGES/DRESSINGS) IMPLANT
BLADE OSC/SAGITTAL MD 5.5X18 (BLADE) ×2 IMPLANT
BLADE OSCILLATING/SAGITTAL (BLADE)
BLADE SURG MINI STRL (BLADE) ×2 IMPLANT
BLADE SW THK.38XMED LNG THN (BLADE) IMPLANT
BNDG COHESIVE 4X5 TAN STRL (GAUZE/BANDAGES/DRESSINGS) ×2 IMPLANT
BNDG COHESIVE 6X5 TAN STRL LF (GAUZE/BANDAGES/DRESSINGS) ×2 IMPLANT
BNDG ESMARK 4X12 TAN STRL LF (GAUZE/BANDAGES/DRESSINGS) ×2 IMPLANT
BNDG GAUZE 4.5X4.1 6PLY STRL (MISCELLANEOUS) ×2 IMPLANT
CANISTER SUCT 1200ML W/VALVE (MISCELLANEOUS) IMPLANT
CANISTER SUCT 3000ML (MISCELLANEOUS) ×4 IMPLANT
CUFF DUAL TOURNIQUET 18IN DISP (TOURNIQUET CUFF) ×2 IMPLANT
CUFF TOURN 18 STER (MISCELLANEOUS) ×2 IMPLANT
CUFF TOURN DUAL PL 12 NO SLV (MISCELLANEOUS) ×2 IMPLANT
CUFF TOURN SGL QUICK 18 (TOURNIQUET CUFF) IMPLANT
DRAPE FLUOR MINI C-ARM 54X84 (DRAPES) ×2 IMPLANT
DRAPE XRAY CASSETTE 23X24 (DRAPES) IMPLANT
DRESSING ALLEVYN 4X4 (MISCELLANEOUS) IMPLANT
DRSG MEPITEL 4X7.2 (GAUZE/BANDAGES/DRESSINGS) ×2 IMPLANT
DURAPREP 26ML APPLICATOR (WOUND CARE) ×2 IMPLANT
ELECT REM PT RETURN 9FT ADLT (ELECTROSURGICAL) ×2
ELECTRODE REM PT RTRN 9FT ADLT (ELECTROSURGICAL) ×1 IMPLANT
FORCEPS JEWEL BIP 4-3/4 STR (INSTRUMENTS) ×2 IMPLANT
GAUZE PACKING 1/4 X5 YD (GAUZE/BANDAGES/DRESSINGS) ×2 IMPLANT
GAUZE PACKING IODOFORM 1/2 (PACKING) ×2 IMPLANT
GAUZE PACKING IODOFORM 1X5 (MISCELLANEOUS) ×2 IMPLANT
GAUZE PETRO XEROFOAM 1X8 (MISCELLANEOUS) IMPLANT
GAUZE SPONGE 4X4 12PLY STRL (GAUZE/BANDAGES/DRESSINGS) ×2 IMPLANT
GAUZE STRETCH 2X75IN STRL (MISCELLANEOUS) IMPLANT
GLOVE BIO SURGEON STRL SZ7.5 (GLOVE) ×6 IMPLANT
GLOVE INDICATOR 8.0 STRL GRN (GLOVE) ×2 IMPLANT
GOWN STRL REUS W/ TWL LRG LVL3 (GOWN DISPOSABLE) ×2 IMPLANT
GOWN STRL REUS W/TWL LRG LVL3 (GOWN DISPOSABLE) ×2
GOWN STRL REUS W/TWL MED LVL3 (GOWN DISPOSABLE) ×4 IMPLANT
HANDPIECE INTERPULSE COAX TIP (DISPOSABLE) ×1
HANDPIECE VERSAJET DEBRIDEMENT (MISCELLANEOUS) IMPLANT
IV NS 1000ML (IV SOLUTION) ×1
IV NS 1000ML BAXH (IV SOLUTION) ×1 IMPLANT
KIT DRSG VAC SLVR GRANUFM (MISCELLANEOUS) ×2 IMPLANT
KIT RM TURNOVER STRD PROC AR (KITS) ×2 IMPLANT
LABEL OR SOLS (LABEL) ×2 IMPLANT
NEEDLE FILTER BLUNT 18X 1/2SAF (NEEDLE) ×1
NEEDLE FILTER BLUNT 18X1 1/2 (NEEDLE) ×1 IMPLANT
NEEDLE HYPO 25X1 1.5 SAFETY (NEEDLE) ×2 IMPLANT
NS IRRIG 500ML POUR BTL (IV SOLUTION) ×2 IMPLANT
PACK EXTREMITY ARMC (MISCELLANEOUS) ×2 IMPLANT
PAD ABD DERMACEA PRESS 5X9 (GAUZE/BANDAGES/DRESSINGS) IMPLANT
RASP SM TEAR CROSS CUT (RASP) IMPLANT
SET HNDPC FAN SPRY TIP SCT (DISPOSABLE) ×1 IMPLANT
SOL .9 NS 3000ML IRR  AL (IV SOLUTION)
SOL .9 NS 3000ML IRR UROMATIC (IV SOLUTION) IMPLANT
SOL PREP PVP 2OZ (MISCELLANEOUS) ×2
SOLUTION PREP PVP 2OZ (MISCELLANEOUS) ×1 IMPLANT
STOCKINETTE IMPERVIOUS 9X36 MD (GAUZE/BANDAGES/DRESSINGS) ×2 IMPLANT
STOCKINETTE M/LG 89821 (MISCELLANEOUS) ×2 IMPLANT
STRAP SAFETY BODY (MISCELLANEOUS) ×2 IMPLANT
SUT ETHILON 2 0 FS 18 (SUTURE) ×4 IMPLANT
SUT ETHILON 3-0 FS-10 30 BLK (SUTURE) ×2
SUT ETHILON 4-0 (SUTURE) ×1
SUT ETHILON 4-0 FS2 18XMFL BLK (SUTURE) ×1
SUT ETHILON 5-0 FS-2 18 BLK (SUTURE) ×2 IMPLANT
SUT VIC AB 3-0 SH 27 (SUTURE) ×1
SUT VIC AB 3-0 SH 27X BRD (SUTURE) ×1 IMPLANT
SUT VIC AB 4-0 FS2 27 (SUTURE) ×2 IMPLANT
SUTURE EHLN 3-0 FS-10 30 BLK (SUTURE) ×1 IMPLANT
SUTURE ETHLN 4-0 FS2 18XMF BLK (SUTURE) ×1 IMPLANT
SWAB CULTURE AMIES ANAERIB BLU (MISCELLANEOUS) IMPLANT
SYR 3ML LL SCALE MARK (SYRINGE) ×2 IMPLANT
SYRINGE 10CC LL (SYRINGE) ×6 IMPLANT
WND VAC CANISTER 500ML (MISCELLANEOUS) ×2 IMPLANT

## 2016-08-22 NOTE — Progress Notes (Signed)
[facility] at [place] NAME: Paul [name]    MR#:  [id number]  DATE OF BIRTH:  09-04-1965  SUBJECTIVE:  CHIEF COMPLAINT:   Chief Complaint  Patient presents with  . Foot Pain   - right foot swelling and ulcer- osteomyelitis noted on X ray and possible debridement today  REVIEW OF SYSTEMS:  Review of Systems  Constitutional: Negative for chills, fever and malaise/fatigue.  HENT: Negative for ear discharge, ear pain, hearing loss and nosebleeds.   Eyes: Negative for blurred vision and double vision.  Respiratory: Negative for cough, shortness of breath and wheezing.   Cardiovascular: Positive for leg swelling. Negative for chest pain and palpitations.  Gastrointestinal: Negative for abdominal pain, constipation, diarrhea, nausea and vomiting.  Genitourinary: Negative for dysuria.  Musculoskeletal: Positive for myalgias.  Neurological: Negative for dizziness, speech change, focal weakness, seizures and headaches.  Psychiatric/Behavioral: Negative for depression.    DRUG ALLERGIES:   Allergies  Allergen Reactions  . Ibuprofen Other (See Comments)    Heart problems    VITALS:  Blood pressure 123/70, pulse 87, temperature 98.7 F (37.1 C), temperature source Oral, resp. rate 18, height 5\' 10"  (1.778 m), weight 120.3 kg (265 lb 3.2 oz), SpO2 97 %.  PHYSICAL EXAMINATION:  Physical Exam  GENERAL:  51 y.o.-year-old patient lying in the bed with no acute distress.  EYES: Pupils equal, round, reactive to light and accommodation. No scleral icterus. Extraocular muscles intact.  HEENT: Head atraumatic, normocephalic. Oropharynx and nasopharynx clear.  NECK:  Supple, no jugular venous distention. No thyroid enlargement, no tenderness.  LUNGS: Normal breath sounds bilaterally, no wheezing, rales,rhonchi or crepitation. No use of accessory muscles of respiration.  CARDIOVASCULAR: S1, S2 normal. No murmurs, rubs, or gallops. AICD in  place ABDOMEN: Soft, nontender, nondistended. Bowel sounds present. No organomegaly or mass.  EXTREMITIES: No  cyanosis, or clubbing. 2+ Lower extremity edema. Right foot dressing in place NEUROLOGIC: Cranial nerves II through XII are intact. Muscle strength 5/5 in all extremities. Sensation intact. Gait not checked.  PSYCHIATRIC: The patient is alert and oriented x 3.  SKIN: No obvious rash, lesion, or ulcer.    LABORATORY PANEL:   CBC  Recent Labs Lab 08/22/16 0402  WBC 17.8*  HGB 10.0*  HCT 30.7*  PLT 200   ------------------------------------------------------------------------------------------------------------------  Chemistries   Recent Labs Lab 08/21/16 2306 08/22/16 0402  NA 133* 135  K 3.8 3.9  CL 102 104  CO2 22 21*  GLUCOSE 262* 243*  BUN 45* 45*  CREATININE 2.03* 1.96*  CALCIUM 8.4* 8.4*  AST 35  --   ALT 21  --   ALKPHOS 133*  --   BILITOT 0.8  --    ------------------------------------------------------------------------------------------------------------------  Cardiac Enzymes No results for input(s): TROPONINI in the last 168 hours. ------------------------------------------------------------------------------------------------------------------  RADIOLOGY:  Dg Foot Complete Right  Result Date: 08/21/2016 CLINICAL DATA:  Weeping sores to the bottom of the right foot and third and fourth digits beginning 3 days ago. EXAM: RIGHT FOOT COMPLETE - 3+ VIEW COMPARISON:  03/29/2014 FINDINGS: Right foot appears intact No evidence of acute fracture or subluxation. No focal bone lesion or bone destruction. Bone cortex and trabecular architecture appear intact. No radiopaque soft tissue foreign bodies. No soft tissue gas collections. Mild dorsal soft tissue swelling. Prominent vascular calcifications. IMPRESSION: Dorsal soft tissue swelling. No radiopaque foreign bodies or gas collections. No acute bony abnormalities. Electronically Signed   By: Oren Beckmann.D.  On: 08/21/2016 22:14    EKG:   Orders placed or performed during the hospital encounter of 08/21/16  . ED EKG  . ED EKG  . EKG 12-Lead  . EKG 12-Lead    ASSESSMENT AND PLAN:   51 y/o M with PMH significant for CAD status post CABG, ischemic cardiomyopathy s/p AICD, hypertension, IDDM, CKD admitted to hospital secondary to right foot ulcer with purulent discharge.  #1 Diabetic foot infection- with osteomyelitis- necrosis around 4th and 3rd toes - appreciate podiatry consult, for OR and debridement and ? Amputation today - continue broad spectrum ABX with vancomycin and zosyn - f/u intra operative cultures  #2 CAD s/p CABG- stable, no active cardiac symptoms - cardiac clearance requested  #3 Systolic CHF- cardiomyopathy ischemia- stable, well compensated. Continue torsemide orally Cardiac clearance requested s/p AICD On asa, coreg and aldactone. Reaction to lisinopril in the past. Outpatient plans to start ARB  #4 DM- restart lantus tonight and on ssi  #5 DVT Prophylaxis- SQ heparin   All the records are reviewed and case discussed with Care Management/Social Workerr. Management plans discussed with the patient, family and they are in agreement.  CODE STATUS: Full Code  TOTAL TIME TAKING CARE OF THIS PATIENT: 38 minutes.   POSSIBLE D/C IN 2-3 DAYS, DEPENDING ON CLINICAL CONDITION.   Gladstone Lighter M.D on 08/22/2016 at 1:48 PM  Between 7am to 6pm - Pager - [phone]  After 6pm go to [email or web address] - password [email or web address] [facility]  Office  [phone]  CC: Primary care physician; Kandice Hams, MD

## 2016-08-22 NOTE — Consult Note (Signed)
CARDIOLOGY CONSULT NOTE     Primary Care Physician: Kandice Hams, MD Referring Physician: Lance Coon Admit Date: 08/21/2016  Reason for consultation: preoperative assessment  Paul [name]. is a 51 y.o. male who is being seen today for the evaluation of chronic systolic heart failure at the request of Lance Coon.   Paul [name]. is a 51 y.o. male with a h/o chronic systolic heart failure, coronary artery disease, diabetes, hypertension, sleep apnea. He has a [employer] ICD in place. He presented to the hospital with right foot pain. Per the patient, last weekend he had a pedicure. He developed pain after the pedicure and took his sock off on Friday and found to have bloody drainage. He presented to the hospital and was found to have gangrenous toes on his right foot. Plan for surgery with possible amputation. In discussing with the patient, he has no chest pain, shortness of breath, PND, orthopnea. He can walk on flat ground without limitation. He can climb a flight of stairs causes him pain in his feet with mild shortness of breath at the top of the stairs.  Past Medical History:  Diagnosis Date  . AICD (automatic cardioverter/defibrillator) present   . Cardiac defibrillator in place   . CHF (congestive heart failure) (HCC)    takes Torsemide daily  . CHF (congestive heart failure) ([place])   . Coronary artery disease   . DDD (degenerative disc disease), lumbosacral    L5-S1  . Diabetes ([place])    Lantus at bedtime  . Hypertension    takes Coreg daily  . Sleep apnea    sleep study yr ago-unable to afford cpap  . Stroke [facility]) 09   no weakness   Past Surgical History:  Procedure Laterality Date  . CARDIAC DEFIBRILLATOR PLACEMENT  2011  . CHOLECYSTECTOMY  2010  . CORONARY ARTERY BYPASS GRAFT  2010   CABG x 3   . HIP SURGERY Right 1994  . MASS EXCISION Right 07/18/2014   Procedure: EXCISION HETEROTOPIC BONE RIGHT HIP;  Surgeon: Frederik Pear, MD;  Location: [place];  Service:  Orthopedics;  Laterality: Right;  . Open Heart Surgery  2010   x 3    . aspirin  81 mg Oral Daily  . carvedilol  12.5 mg Oral BID WC  . heparin  5,000 Units Subcutaneous Q8H  . insulin aspart  0-5 Units Subcutaneous QHS  . insulin aspart  0-9 Units Subcutaneous TID WC  . spironolactone  25 mg Oral Daily  . torsemide  40 mg Oral BID   . piperacillin-tazobactam (ZOSYN)  IV Stopped (08/22/16 1201)  . vancomycin 1,500 mg (08/22/16 1227)    Allergies  Allergen Reactions  . Ibuprofen Other (See Comments)    Heart problems    Social History   Social History  . Marital status: Married    Spouse name: N/A  . Number of children: N/A  . Years of education: N/A   Occupational History  . Not on file.   Social History Main Topics  . Smoking status: Never Smoker  . Smokeless tobacco: Never Used  . Alcohol use No  . Drug use: No  . Sexual activity: Not on file   Other Topics Concern  . Not on file   Social History Narrative  . No narrative on file    Family History  Problem Relation Age of Onset  . Hypertension Other   . Diabetes Other   . Diabetes Father   . Hyperlipidemia Father   .  Hypertension Father   . Hyperlipidemia Sister   . Hypertension Sister   . Diabetes Sister   . Diabetes Brother   . Hypertension Brother   . Hyperlipidemia Brother     ROS- All systems are reviewed and negative except as per the HPI above  Physical Exam: Telemetry: Vitals:   08/22/16 0000 08/22/16 0054 08/22/16 0114 08/22/16 0715  BP: 109/66 133/61  123/70  Pulse: 80 87  87  Resp: (!) 24 19  18   Temp:  98.8 F (37.1 C)  98.7 F (37.1 C)  TempSrc:  Oral  Oral  SpO2: 99% 96%  97%  Weight:   265 lb 3.2 oz (120.3 kg)   Height:        GEN- The patient is well appearing, alert and oriented x 3 today.   Head- normocephalic, atraumatic Eyes-  Sclera clear, conjunctiva pink Ears- hearing intact Oropharynx- clear Neck- supple, no JVP Lymph- no cervical lymphadenopathy Lungs-  Clear to ausculation bilaterally, normal work of breathing Heart- Regular rate and rhythm, no murmurs, rubs or gallops, PMI not laterally displaced GI- soft, NT, ND, + BS Extremities- no clubbing, cyanosis, or edema MS- no significant deformity or atrophy Skin- no rash or lesion Psych- euthymic mood, full affect Neuro- strength and sensation are intact  EKG: sinus tachycardia, RBBB, LAFB, TWI (old) Personally reviewed  Labs:   Lab Results  Component Value Date   WBC 17.8 (H) 08/22/2016   HGB 10.0 (L) 08/22/2016   HCT 30.7 (L) 08/22/2016   MCV 84.8 08/22/2016   PLT 200 08/22/2016    Recent Labs Lab 08/21/16 2306 08/22/16 0402  NA 133* 135  K 3.8 3.9  CL 102 104  CO2 22 21*  BUN 45* 45*  CREATININE 2.03* 1.96*  CALCIUM 8.4* 8.4*  PROT 7.4  --   BILITOT 0.8  --   ALKPHOS 133*  --   ALT 21  --   AST 35  --   GLUCOSE 262* 243*   No results found for: CKTOTAL, CKMB, CKMBINDEX, TROPONINI No results found for: CHOL No results found for: HDL No results found for: LDLCALC No results found for: TRIG No results found for: CHOLHDL No results found for: LDLDIRECT    Radiology: right foot Dorsal soft tissue swelling. No radiopaque foreign bodies or gas collections. No acute bony abnormalities.  Echo: - Left ventricle: The cavity size was mildly dilated. Wall   thickness was normal. Systolic function was severely reduced. The   estimated ejection fraction was in the range of 25% to 30%.   Diffuse hypokinesis. There is akinesis of the apical myocardium.   Doppler parameters are consistent with high ventricular filling   pressure. - Mitral valve: Calcified annulus. There was mild regurgitation. - Left atrium: The atrium was mildly dilated. - Right ventricle: Systolic function was moderately reduced. - Right atrium: The atrium was mildly dilated. - Pulmonary arteries: Systolic pressure was mildly increased. PA   peak pressure: 48 mm Hg (S).  ASSESSMENT AND PLAN:   1.  Right foot gangrene: plan for surgery with possible amputation per podiatry. Currently, his heart failure appears to be fairly well compensated. He does not have shortness of breath when walking on flat ground, can climb a flight of stairs, and does not have PND or orthopnea. He would thus be at intermediate risk for an intermediate risk procedure. Would continue all of his heart failure medications through this hospital stay. Place him on telemetry for at least 24 hours after  his operation.  2. Chronic systolic heart failure due to nonischemic cardiomyopathy: Appears to be well compensated. Does not have much in the way of lower extremity edema, and has not been short of breath without PND or orthopnea. Would continue his heart failure medications. Would aim to keep his volume status relatively euvolemic.   3. Hypertension: Continues to be well controlled on his heart failure medications.  Dacie Mandel Meredith Leeds, MD 08/22/2016  12:52 PM

## 2016-08-22 NOTE — Anesthesia Preprocedure Evaluation (Signed)
Anesthesia Evaluation  Patient identified by MRN, date of birth, ID band  Reviewed: Allergy & Precautions, NPO status , Patient's Chart, lab work & pertinent test results, reviewed documented beta blocker date and time   Airway Mallampati: II       Dental  (+) Upper Dentures, Lower Dentures   Pulmonary sleep apnea ,           Cardiovascular hypertension, Pt. on medications and Pt. on home beta blockers + CAD and +CHF  + Cardiac Defibrillator      Neuro/Psych    GI/Hepatic negative GI ROS, Neg liver ROS,   Endo/Other  diabetes, Type 2, Insulin Dependent  Renal/GU negative Renal ROS     Musculoskeletal   Abdominal   Peds  Hematology   Anesthesia Other Findings   Reproductive/Obstetrics                             Anesthesia Physical Anesthesia Plan  ASA: III and emergent  Anesthesia Plan: General   Post-op Pain Management:    Induction: Intravenous  Airway Management Planned: LMA  Additional Equipment:   Intra-op Plan:   Post-operative Plan:   Informed Consent: I have reviewed the patients History and Physical, chart, labs and discussed the procedure including the risks, benefits and alternatives for the proposed anesthesia with the patient or authorized representative who has indicated his/her understanding and acceptance.     Plan Discussed with:   Anesthesia Plan Comments:         Anesthesia Quick Evaluation

## 2016-08-22 NOTE — Transfer of Care (Signed)
Immediate Anesthesia Transfer of Care Note  Patient: Paul [name].  Procedure(s) Performed: Procedure(s): AMPUTATION TOE (Right) IRRIGATION AND DEBRIDEMENT WOUND and wound vac placement (Right)  Patient Location: PACU  Anesthesia Type:General  Level of Consciousness: awake, alert  and oriented  Airway & Oxygen Therapy: Patient connected to face mask oxygen  Post-op Assessment: Report given to RN and Post -op Vital signs reviewed and stable  Post vital signs: Reviewed and stable  Last Vitals:  Vitals:   08/22/16 1402 08/22/16 1721  BP: (!) 96/57 126/77  Pulse: 85 86  Resp: 18 (!) 21  Temp: 37.9 C 37.2 C    Last Pain:  Vitals:   08/22/16 1402  TempSrc: Oral  PainSc:       Patients Stated Pain Goal: 3 (36/01/65 8006)  Complications: No apparent anesthesia complications

## 2016-08-22 NOTE — Plan of Care (Signed)
Problem: Safety: Goal: Ability to remain free from injury will improve Outcome: Progressing Pt alert and oriented. Oriented to surrounding. Pt understands to ring for assistance when ambulating.  Problem: Pain Managment: Goal: General experience of comfort will improve Outcome: Progressing Pain control with oral and iv medications.

## 2016-08-22 NOTE — Anesthesia Procedure Notes (Signed)
Procedure Name: LMA Insertion Date/Time: 08/22/2016 4:08 PM Performed by: Jennette Bill Pre-anesthesia Checklist: Patient identified, Emergency Drugs available, Suction available, Patient being monitored and Timeout performed Patient Re-evaluated:Patient Re-evaluated prior to inductionOxygen Delivery Method: Circle system utilized Preoxygenation: Pre-oxygenation with 100% oxygen Intubation Type: IV induction LMA: LMA inserted LMA Size: 4.5 Number of attempts: 1

## 2016-08-22 NOTE — Consult Note (Signed)
ORTHOPAEDIC CONSULTATION  REQUESTING PHYSICIAN: Gladstone Lighter, MD  Chief Complaint: Right foot infection  HPI: Paul [name]. is a 51 y.o. male who complains of  Worsening infection right foot.  Hx of DM with worsening pain to right foot over the last week.  Seen in ER with black toes and admitted with diabetic foot infection.    Past Medical History:  Diagnosis Date  . AICD (automatic cardioverter/defibrillator) present   . Cardiac defibrillator in place   . CHF (congestive heart failure) (HCC)    takes Torsemide daily  . CHF (congestive heart failure) ([place])   . Coronary artery disease   . DDD (degenerative disc disease), lumbosacral    L5-S1  . Diabetes ([place])    Lantus at bedtime  . Hypertension    takes Coreg daily  . Sleep apnea    sleep study yr ago-unable to afford cpap  . Stroke [facility]) 09   no weakness   Past Surgical History:  Procedure Laterality Date  . CARDIAC DEFIBRILLATOR PLACEMENT  2011  . CHOLECYSTECTOMY  2010  . CORONARY ARTERY BYPASS GRAFT  2010   CABG x 3   . HIP SURGERY Right 1994  . MASS EXCISION Right 07/18/2014   Procedure: EXCISION HETEROTOPIC BONE RIGHT HIP;  Surgeon: Frederik Pear, MD;  Location: [place];  Service: Orthopedics;  Laterality: Right;  . Open Heart Surgery  2010   x 3   Social History   Social History  . Marital status: Married    Spouse name: N/A  . Number of children: N/A  . Years of education: N/A   Social History Main Topics  . Smoking status: Never Smoker  . Smokeless tobacco: Never Used  . Alcohol use No  . Drug use: No  . Sexual activity: Not Asked   Other Topics Concern  . None   Social History Narrative  . None   Family History  Problem Relation Age of Onset  . Hypertension Other   . Diabetes Other   . Diabetes Father   . Hyperlipidemia Father   . Hypertension Father   . Hyperlipidemia Sister   . Hypertension Sister   . Diabetes Sister   . Diabetes Brother   . Hypertension Brother   .  Hyperlipidemia Brother    Allergies  Allergen Reactions  . Ibuprofen Other (See Comments)    Heart problems   Prior to Admission medications   Medication Sig Start Date End Date Taking? Authorizing Provider  aspirin 81 MG tablet Take 81 mg by mouth daily.   Yes Historical Provider, MD  carvedilol (COREG) 12.5 MG tablet Take 12.5 mg by mouth 2 (two) times daily with a meal.   Yes Historical Provider, MD  indomethacin (INDOCIN) 50 MG capsule Take 1 capsule (50 mg total) by mouth 3 (three) times daily with meals as needed. Patient taking differently: Take 50 mg by mouth 3 (three) times daily with meals as needed for mild pain.  05/14/16  Yes Laban Emperor, PA-C  insulin glargine (LANTUS) 100 UNIT/ML injection Inject 24 Units into the skin at bedtime.    Yes Historical Provider, MD  spironolactone (ALDACTONE) 25 MG tablet Take 25 mg by mouth daily.   Yes Historical Provider, MD  torsemide (DEMADEX) 20 MG tablet Take 2 tablets (40 mg total) by mouth 2 (two) times daily. 06/23/16  Yes Will Meredith Leeds, MD   Dg Foot Complete Right  Result Date: 08/21/2016 CLINICAL DATA:  Weeping sores to the bottom of the right  foot and third and fourth digits beginning 3 days ago. EXAM: RIGHT FOOT COMPLETE - 3+ VIEW COMPARISON:  03/29/2014 FINDINGS: Right foot appears intact No evidence of acute fracture or subluxation. No focal bone lesion or bone destruction. Bone cortex and trabecular architecture appear intact. No radiopaque soft tissue foreign bodies. No soft tissue gas collections. Mild dorsal soft tissue swelling. Prominent vascular calcifications. IMPRESSION: Dorsal soft tissue swelling. No radiopaque foreign bodies or gas collections. No acute bony abnormalities. Electronically Signed   By: Lucienne Capers M.D.   On: 08/21/2016 22:14  I reviewed the xrays and negative for obvious erosion but close inspection of the 4th toe shows small areas of gas.  Positive ROS: All other systems have been reviewed and  were otherwise negative with the exception of those mentioned in the HPI and as above.  12 point ROS was performed.  Physical Exam: General: Alert and oriented.  No apparent distress.  Vascular:  Left foot:Dorsalis Pedis:  diminished Posterior Tibial:  absent  Right foot: Dorsalis Pedis:  absent Posterior Tibial:  absent   Barley audible monophasic DP on right and non-audible PT on right Neuro:absent protective sensation  Derm:Necrotic tissue plantar 4th mtpj with foul odor.  4th toe necrotic with fluctuance around 3rd toe and small dry eschar to plantar 5th toe.  Fluctuance to 3rd toe as well and purulence noted with puncture of skin. Left foot without issue  Ortho/MS: Diffuse edema to right leg with pain with palpation.   Assessment: Diabetic foot infection with gas producing infection right foot  Plan: Wound culture obtained. Unable to obtain MRI secondary to defibrillator.   Will plan for I & D and likely toe amputation to remove infection at this time.  D/W pt R/B/A/C and pt consents to surgery. Consult vascular with monophasic DP and non-dopplerable PT. Has been NPO since last night.     Elesa Hacker, DPM Cell [phone]   08/22/2016 8:57 AM

## 2016-08-22 NOTE — Anesthesia Postprocedure Evaluation (Signed)
Anesthesia Post Note  Patient: Paul [name].  Procedure(s) Performed: Procedure(s) (LRB): AMPUTATION TOE (Right) IRRIGATION AND DEBRIDEMENT WOUND and wound vac placement (Right)  Patient location during evaluation: PACU Anesthesia Type: General Level of consciousness: awake and alert Pain management: pain level controlled Vital Signs Assessment: post-procedure vital signs reviewed and stable Respiratory status: spontaneous breathing and respiratory function stable Cardiovascular status: stable Anesthetic complications: no     Last Vitals:  Vitals:   08/22/16 1402 08/22/16 1721  BP: (!) 96/57 126/77  Pulse: 85 86  Resp: 18 (!) 21  Temp: 37.9 C 37.2 C    Last Pain:  Vitals:   08/22/16 1402  TempSrc: Oral  PainSc:                  KEPHART,WILLIAM K

## 2016-08-22 NOTE — Op Note (Signed)
Operative note   Surgeon:Hani Patnode    Assistant:none    Preop diagnosis: gangrene right 3rd and 4th toe and joints, abscess dorsal and plantar foot    Postop diagnosis:same    Procedure:1 amputation right third toe MTPJ. 2. Amputation right fourth toe MTPJ 3. Excision of right third metatarsal at the surgical neck 4. Excision of right fourth metatarsal at surgical neck 5. Incision and drainage plantar and dorsal deep space abscesses with extension to midfoot and dorsal right foot.    EBL: Less than 25 cc    Anesthesia:general    Hemostasis: None    Specimen: Deep wound culture and gangrenous fourth and fifth toes with metatarsals    Complications: None    Operative indications:Paul [name]. is an 51 y.o. that presents today for surgical intervention.  The risks/benefits/alternatives/complications have been discussed and consent has been given.    Procedure:  Patient was brought into the OR and placed on the operating table in thesupine position. After anesthesia was obtained theright lower extremity was prepped and draped in usual sterile fashion.  Attention was directed initially to the right fourth toe that was completely gangrenous and the toe was disarticulated with full-thickness incision around the MTPJ. Marketed necrotic soft tissue was still noted around the metatarsal head. The third toe also was completely necrotic and this was disarticulated full-thickness incision at the MTPJ with continued necrotic tissue around the metatarsal head. A large plantar ulceration was previously noted and full-thickness incision of severe foul-smelling necrotic soft tissue was removed to healthy tissue. The dorsal midfoot was then incised to the level of the base of the metatarsals at the base of the third and fourth metatarsal region were purulent abscess was noted at the dorsal foot. At this time initial excisional debridement was performed of all of the deep subcutaneous muscle fascia  down to bone level. This was carried dorsally to the lateral portion of the second metatarsal. This was taken down to mostly healthy tissue medially along the second metatarsal. Laterally further necrotic tissue was found around the fifth metatarsal. All areas of hemorrhaged vessels were found with minimal bleeding noted throughout the procedure. Was marketed necrotic soft tissue around the entire third and fourth metatarsal heads and these were then excised for further debridement of necrotic tissue. The excision of the metatarsal heads was performed with an osteotomy with a power saw at the surgical neck region. Further excisional debridement of residual necrotic soft tissue was performed. At this time the wound was flushed with copious amounts or irrigation. Closure of the dorsal and plantar incisions was performed with a 2-0 nylon. Residual defect at the metatarsophalangeal joint regions was noted to the third and fourth distal residual metatarsals. This wound was then packed with a silver impregnated wound VAC dressing and a wound VAC was then applied to the right foot.  During the procedure there was noted markedly multiple areas of hemorrhaged blood vessels with minimal bleeding. Concern for further necrosis at this time. We'll have to monitor in the near future. Patient is at high risk of at minimum transmetatarsal amputation in the near future.    Patient tolerated the procedure and anesthesia well.  Was transported from the OR to the PACU with all vital signs stable and vascular status intact. To be discharged per routine protocol.  Will follow up in approximately 1 week in the outpatient clinic.

## 2016-08-22 NOTE — Progress Notes (Signed)
Pharmacy Antibiotic Note  Paul [name]. is a 51 y.o. male admitted on 08/21/2016 with wound infection.  Pharmacy has been consulted for vancomycin and Zosyn dosing.  Plan: DW 92kg  Vd 64L kei 0.051 hr-1  t1/2 14 hours Vancomycin 1500 mg q 18 hours ordered with stacked dosing. Level before 5th dose. Goal trough 15-20.  Zosyn 4.5 grams q 8 hours ordered for TBW 120 kg.  Height: 5\' 10"  (177.8 cm) Weight: 265 lb (120.2 kg) IBW/kg (Calculated) : 73  Temp (24hrs), Avg:98.2 F (36.8 C), Min:98.2 F (36.8 C), Max:98.2 F (36.8 C)   Recent Labs Lab 08/21/16 2306 08/21/16 2318  WBC 22.3*  --   CREATININE 2.03*  --   LATICACIDVEN  --  1.1    Estimated Creatinine Clearance: 56 mL/min (A) (by C-G formula based on SCr of 2.03 mg/dL (H)).    Allergies  Allergen Reactions  . Ibuprofen Other (See Comments)    Heart problems    Antimicrobials this admission: vancomycin Zosyn 4/21 >>    >>   Dose adjustments this admission:   Microbiology results: 4/20 BCx: pending    Thank you for allowing pharmacy to be a part of this patient'[name] care.  Paul [name] 08/22/2016 12:54 AM

## 2016-08-22 NOTE — Consult Note (Signed)
Reason for Consult:Right Foot Diabetic Infection  Referring Physician: Dr. Kerrin Mo. is an 51 y.o. male.  HPI: 60 yom history of DM, CAD(CABG), CHF presents with ~1 week of right foot discomfort. 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 He does have neuropathy. He states he had a pedicure and noted progressive toe pain. He states he was unable to remove his sock because of gout. When he did remove the sock after several days he noted infection/gangrene of the toes. Denies TIA symptoms. Denies chest pain. Noted recent upper respiratory infection.  Past Medical History:  Diagnosis Date  . AICD (automatic cardioverter/defibrillator) present   . Cardiac defibrillator in place   . CHF (congestive heart failure) (HCC)    takes Torsemide daily  . CHF (congestive heart failure) ([place])   . Coronary artery disease   . DDD (degenerative disc disease), lumbosacral    L5-S1  . Diabetes ([place])    Lantus at bedtime  . Hypertension    takes Coreg daily  . Sleep apnea    sleep study yr ago-unable to afford cpap  . Stroke [facility]) 09   no weakness    Past Surgical History:  Procedure Laterality Date  . CARDIAC DEFIBRILLATOR PLACEMENT  2011  . CHOLECYSTECTOMY  2010  . CORONARY ARTERY BYPASS GRAFT  2010   CABG x 3   . HIP SURGERY Right 1994  . MASS EXCISION Right 07/18/2014   Procedure: EXCISION HETEROTOPIC BONE RIGHT HIP;  Surgeon: Frederik Pear, MD;  Location: [place];  Service: Orthopedics;  Laterality: Right;  . Open Heart Surgery  2010   x 3    Family History  Problem Relation Age of Onset  . Hypertension Other   . Diabetes Other   . Diabetes Father   . Hyperlipidemia Father   . Hypertension Father   . Hyperlipidemia Sister   . Hypertension Sister   . Diabetes Sister   . Diabetes Brother   . Hypertension Brother   . Hyperlipidemia Brother     Social History:  reports that he has  never smoked. He has never used smokeless tobacco. He reports that he does not drink alcohol or use drugs.  Allergies:  Allergies  Allergen Reactions  . Ibuprofen Other (See Comments)    Heart problems    Medications: I have reviewed the patient's current medications.  Results for orders placed or performed during the hospital encounter of 08/21/16 (from the past 48 hour(s))  Culture, blood (routine x 2)     Status: None (Preliminary result)   Collection Time: 08/21/16 11:05 PM  Result Value Ref Range   Specimen Description BLOOD RIGHT WRIST    Special Requests      BOTTLES DRAWN AEROBIC AND ANAEROBIC Blood Culture adequate volume   Culture NO GROWTH < 12 HOURS    Report Status PENDING   CBC with Differential     Status: Abnormal   Collection Time: 08/21/16 11:06 PM  Result Value Ref Range   WBC 22.3 (H) 3.8 - 10.6 K/uL   RBC 3.63 (L) 4.40 - 5.90 MIL/uL   Hemoglobin 9.9 (L) 13.0 - 18.0 g/dL   HCT 30.9 (L) 40.0 - 52.0 %   MCV 84.9 80.0 - 100.0 fL   MCH 27.3 26.0 - 34.0 pg   MCHC 32.2 32.0 - 36.0 g/dL   RDW 15.4 (H) 11.5 - 14.5 %  Platelets 203 150 - 440 K/uL   Neutrophils Relative % 84 %   Neutro Abs 18.8 (H) 1.4 - 6.5 K/uL   Lymphocytes Relative 6 %   Lymphs Abs 1.3 1.0 - 3.6 K/uL   Monocytes Relative 10 %   Monocytes Absolute 2.2 (H) 0.2 - 1.0 K/uL   Eosinophils Relative 0 %   Eosinophils Absolute 0.0 0 - 0.7 K/uL   Basophils Relative 0 %   Basophils Absolute 0.1 0 - 0.1 K/uL   Smear Review MORPHOLOGY UNREMARKABLE   Comprehensive metabolic panel     Status: Abnormal   Collection Time: 08/21/16 11:06 PM  Result Value Ref Range   Sodium 133 (L) 135 - 145 mmol/L   Potassium 3.8 3.5 - 5.1 mmol/L   Chloride 102 101 - 111 mmol/L   CO2 22 22 - 32 mmol/L   Glucose, Bld 262 (H) 65 - 99 mg/dL   BUN 45 (H) 6 - 20 mg/dL   Creatinine, Ser 2.03 (H) 0.61 - 1.24 mg/dL   Calcium 8.4 (L) 8.9 - 10.3 mg/dL   Total Protein 7.4 6.5 - 8.1 g/dL   Albumin 3.1 (L) 3.5 - 5.0 g/dL   AST  35 15 - 41 U/L   ALT 21 17 - 63 U/L   Alkaline Phosphatase 133 (H) 38 - 126 U/L   Total Bilirubin 0.8 0.3 - 1.2 mg/dL   GFR calc non Af Amer 36 (L) >60 mL/min   GFR calc Af Amer 42 (L) >60 mL/min    Comment: (NOTE) The eGFR has been calculated using the CKD EPI equation. This calculation has not been validated in all clinical situations. eGFR's persistently <60 mL/min signify possible Chronic Kidney Disease.    Anion gap 9 5 - 15  Lactic acid, plasma     Status: None   Collection Time: 08/21/16 11:18 PM  Result Value Ref Range   Lactic Acid, Venous 1.1 0.5 - 1.9 mmol/L  Culture, blood (routine x 2)     Status: None (Preliminary result)   Collection Time: 08/22/16 12:13 AM  Result Value Ref Range   Specimen Description BLOOD RIGHT HAND    Special Requests      BOTTLES DRAWN AEROBIC AND ANAEROBIC Blood Culture adequate volume   Culture NO GROWTH < 12 HOURS    Report Status PENDING   Glucose, capillary     Status: Abnormal   Collection Time: 08/22/16 12:53 AM  Result Value Ref Range   Glucose-Capillary 228 (H) 65 - 99 mg/dL  Basic metabolic panel     Status: Abnormal   Collection Time: 08/22/16  4:02 AM  Result Value Ref Range   Sodium 135 135 - 145 mmol/L   Potassium 3.9 3.5 - 5.1 mmol/L   Chloride 104 101 - 111 mmol/L   CO2 21 (L) 22 - 32 mmol/L   Glucose, Bld 243 (H) 65 - 99 mg/dL   BUN 45 (H) 6 - 20 mg/dL   Creatinine, Ser 1.96 (H) 0.61 - 1.24 mg/dL   Calcium 8.4 (L) 8.9 - 10.3 mg/dL   GFR calc non Af Amer 38 (L) >60 mL/min   GFR calc Af Amer 44 (L) >60 mL/min    Comment: (NOTE) The eGFR has been calculated using the CKD EPI equation. This calculation has not been validated in all clinical situations. eGFR's persistently <60 mL/min signify possible Chronic Kidney Disease.    Anion gap 10 5 - 15  CBC     Status: Abnormal   Collection  Time: 08/22/16  4:02 AM  Result Value Ref Range   WBC 17.8 (H) 3.8 - 10.6 K/uL   RBC 3.62 (L) 4.40 - 5.90 MIL/uL   Hemoglobin 10.0  (L) 13.0 - 18.0 g/dL   HCT 30.7 (L) 40.0 - 52.0 %   MCV 84.8 80.0 - 100.0 fL   MCH 27.5 26.0 - 34.0 pg   MCHC 32.5 32.0 - 36.0 g/dL   RDW 15.4 (H) 11.5 - 14.5 %   Platelets 200 150 - 440 K/uL  Glucose, capillary     Status: Abnormal   Collection Time: 08/22/16  7:34 AM  Result Value Ref Range   Glucose-Capillary 196 (H) 65 - 99 mg/dL   Comment 1 Notify RN     Dg Foot Complete Right  Result Date: 08/21/2016 CLINICAL DATA:  Weeping sores to the bottom of the right foot and third and fourth digits beginning 3 days ago. EXAM: RIGHT FOOT COMPLETE - 3+ VIEW COMPARISON:  03/29/2014 FINDINGS: Right foot appears intact No evidence of acute fracture or subluxation. No focal bone lesion or bone destruction. Bone cortex and trabecular architecture appear intact. No radiopaque soft tissue foreign bodies. No soft tissue gas collections. Mild dorsal soft tissue swelling. Prominent vascular calcifications. IMPRESSION: Dorsal soft tissue swelling. No radiopaque foreign bodies or gas collections. No acute bony abnormalities. Electronically Signed   By: Lucienne Capers M.D.   On: 08/21/2016 22:14    Review of Systems  Constitutional: Negative for chills and fever.  Respiratory: Positive for cough.   Cardiovascular: Positive for leg swelling. Negative for chest pain, palpitations and claudication.  Gastrointestinal: Negative.   Skin: Negative.   Neurological: Negative for dizziness, sensory change, speech change and focal weakness.  Endo/Heme/Allergies: Negative.    Blood pressure 123/70, pulse 87, temperature 98.7 F (37.1 C), temperature source Oral, resp. rate 18, height 5' 10"  (1.778 m), weight 120.3 kg (265 lb 3.2 oz), SpO2 97 %. Physical Exam  Nursing note and vitals reviewed. Constitutional: He is oriented to person, place, and time. He appears well-developed and well-nourished.  Cardiovascular: Normal rate and regular rhythm.   Palpable femoral pulses bilaterally Right- monophasic DP, no PT.  Left- DP/PT- monophasic  Respiratory: Effort normal and breath sounds normal.  GI: Soft. He exhibits no distension. There is no tenderness.  Musculoskeletal: He exhibits edema.  Neurological: He is alert and oriented to person, place, and time.  Skin: Skin is warm and dry.    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  Anaaya Fuster A 08/22/2016, 9:37 AM

## 2016-08-22 NOTE — Progress Notes (Signed)
Patient underwent debridement of right foot with amputation of fourth and third toes due to and severe necrosis with excision of distal third and fourth metatarsal heads. Area of necrotic tissue on the distal aspect of the right fifth toe was noted. Infection to the dorsal midfoot was noted. Minimal bleeding was noted throughout the procedure with areas of hemorrhaged blood vessels. Wound was left open and packed with a wound VAC for assistance with closure. Patient is at high risk of needing transmetatarsal amputation or further amputation the future. We'll await vascular workup and consider further procedure to the right foot as needed.

## 2016-08-22 NOTE — Anesthesia Post-op Follow-up Note (Cosign Needed)
Anesthesia QCDR form completed.        

## 2016-08-23 ENCOUNTER — Encounter: Payer: Self-pay | Admitting: Podiatry

## 2016-08-23 LAB — HEMOGLOBIN A1C
HEMOGLOBIN A1C: 11.2 % — AB (ref 4.8–5.6)
Mean Plasma Glucose: 275 mg/dL

## 2016-08-23 LAB — CBC
HCT: 29.8 % — ABNORMAL LOW (ref 40.0–52.0)
Hemoglobin: 9.6 g/dL — ABNORMAL LOW (ref 13.0–18.0)
MCH: 27.2 pg (ref 26.0–34.0)
MCHC: 32.3 g/dL (ref 32.0–36.0)
MCV: 84.1 fL (ref 80.0–100.0)
Platelets: 237 10*3/uL (ref 150–440)
RBC: 3.55 MIL/uL — ABNORMAL LOW (ref 4.40–5.90)
RDW: 16 % — ABNORMAL HIGH (ref 11.5–14.5)
WBC: 15.8 10*3/uL — ABNORMAL HIGH (ref 3.8–10.6)

## 2016-08-23 LAB — GLUCOSE, CAPILLARY
GLUCOSE-CAPILLARY: 150 mg/dL — AB (ref 65–99)
GLUCOSE-CAPILLARY: 269 mg/dL — AB (ref 65–99)
Glucose-Capillary: 113 mg/dL — ABNORMAL HIGH (ref 65–99)
Glucose-Capillary: 218 mg/dL — ABNORMAL HIGH (ref 65–99)

## 2016-08-23 LAB — BASIC METABOLIC PANEL
Anion gap: 10 (ref 5–15)
BUN: 42 mg/dL — ABNORMAL HIGH (ref 6–20)
CO2: 23 mmol/L (ref 22–32)
Calcium: 8.3 mg/dL — ABNORMAL LOW (ref 8.9–10.3)
Chloride: 101 mmol/L (ref 101–111)
Creatinine, Ser: 2.16 mg/dL — ABNORMAL HIGH (ref 0.61–1.24)
GFR calc Af Amer: 39 mL/min — ABNORMAL LOW (ref >60)
GFR calc non Af Amer: 34 mL/min — ABNORMAL LOW (ref >60)
Glucose, Bld: 118 mg/dL — ABNORMAL HIGH (ref 65–99)
Potassium: 3.8 mmol/L (ref 3.5–5.1)
Sodium: 134 mmol/L — ABNORMAL LOW (ref 135–145)

## 2016-08-23 MED ORDER — COLCHICINE 0.6 MG PO TABS
0.6000 mg | ORAL_TABLET | Freq: Every day | ORAL | Status: DC
  Administered 2016-08-23 – 2016-08-26 (×4): 0.6 mg via ORAL
  Filled 2016-08-23 (×4): qty 1

## 2016-08-23 MED ORDER — SODIUM CHLORIDE 0.9 % IV SOLN
INTRAVENOUS | Status: DC
  Administered 2016-08-23 – 2016-08-24 (×2): via INTRAVENOUS

## 2016-08-23 MED ORDER — INDOMETHACIN 50 MG PO CAPS: 50.0000 mg | ORAL_CAPSULE | Freq: Three times a day (TID) | ORAL | Status: DC

## 2016-08-23 MED ORDER — PREDNISONE 50 MG PO TABS
50.0000 mg | ORAL_TABLET | Freq: Every day | ORAL | Status: DC
  Administered 2016-08-23 – 2016-08-24 (×2): 50 mg via ORAL
  Filled 2016-08-23 (×3): qty 1

## 2016-08-23 NOTE — Progress Notes (Signed)
Pt. Had 20 beat run of v-tach. Dr. Marcille Blanco notified and no new orders received.

## 2016-08-23 NOTE — Progress Notes (Signed)
PT Cancellation Note  Patient Details Name: Paul [name]. MRN: [id number] DOB: [birth year]   Cancelled Treatment:    Reason Eval/Treat Not Completed: Other (comment) PT evaluation attempted; patient requested that we check back later, as he had been up all night with a fever and his leg was painful/throbbing. Spoke with RN and patient is appropriate for PT and has had medication this AM. Will check back with patient for PT evaluation.    Ladell Pier Erina Hamme 08/23/2016, 9:18 AM

## 2016-08-23 NOTE — Progress Notes (Signed)
Physical Therapy Evaluation Patient Details Name: Paul [name]. MRN: [id number] DOB: [birth year] Today's Date: 08/23/2016   History of Present Illness  Pt. is a 51 year old male admitted to [facility] on 08/21/16 after 4 days of increased R foot pain. 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   Clinical Impression  Patient requires assistance for safety with mobility and ambulation tasks as well as increased cueing to maintain NWB status on his RLE. Patient was instructed on NWB status with ambulation as well as with sit <-> stands; however, he is observed at times to rest his foot on the ground. Patient was I with bed mobility tasks today. The importance of not getting up independently was reinforced with the patient due to safety concerns and was conveyed to nursing as well. Patient may benefit from continued PT to address mobility, ambulation, balance and safety with mobility tasks while at [facility].     Follow Up Recommendations Home health PT (depending on progress)    Equipment Recommendations  Rolling walker with 5" wheels    Recommendations for Other Services       Precautions / Restrictions Restrictions Weight Bearing Restrictions: Yes RLE Weight Bearing: Non weight bearing Other Position/Activity Restrictions: Wound Vac R foot      Mobility  Bed Mobility Overal bed mobility: Independent             General bed mobility comments: able to lift R leg into bed independently without issue  Transfers Overall transfer level: Needs assistance Equipment used: Rolling walker (2 wheeled) Transfers: Sit to/from Stand Sit to Stand: Min guard         General transfer comment: cues needed to move slowly and to maintain RLE NWB  Ambulation/Gait Ambulation/Gait assistance: Min assist Ambulation Distance (Feet): 14 Feet Assistive device: Rolling walker (2 wheeled)       General Gait Details: increased cueing needed to  maintain RLE NWB and cues for sequencing/balance  Stairs            Wheelchair Mobility    Modified Rankin (Stroke Patients Only)       Balance Overall balance assessment: Needs assistance Sitting-balance support: Bilateral upper extremity supported (L foot supported) Sitting balance-Leahy Scale: Normal     Standing balance support: Bilateral upper extremity supported (LLE supported) Standing balance-Leahy Scale: Good Standing balance comment: dynamic balance during ambulation was fair                             Pertinent Vitals/Pain Pain Assessment: 0-10 Pain Score: 10-Worst pain ever Pain Location: R foot Pain Descriptors / Indicators: Constant Pain Intervention(s): Premedicated before session    Home Living Family/patient expects to be discharged to:: Private residence Living Arrangements:  (Pt. reports that someone will be there with him) Available Help at Discharge: Other (Comment) (Patient reports that someone will be there with him) Type of Home: House Home Access: Stairs to enter Entrance Stairs-Rails: None Entrance Stairs-Number of Steps: 1 Home Layout: One level Home Equipment: None      Prior Function Level of Independence: Independent               Hand Dominance        Extremity/Trunk Assessment   Upper Extremity Assessment Upper Extremity Assessment: Overall WFL for tasks assessed (pt. does have gout in R elbow (can't extend fully) & hands)    Lower  Extremity Assessment Lower Extremity Assessment: Overall WFL for tasks assessed (in the LLE; RLE not assessed)       Communication   Communication: No difficulties  Cognition Arousal/Alertness: Awake/alert Behavior During Therapy: WFL for tasks assessed/performed Overall Cognitive Status: Within Functional Limits for tasks assessed                                        General Comments      Exercises     Assessment/Plan    PT Assessment  Patient needs continued PT services  PT Problem List Decreased activity tolerance;Decreased balance;Decreased safety awareness;Decreased mobility;Pain       PT Treatment Interventions DME instruction;Stair training;Gait training;Functional mobility training;Therapeutic exercise;Patient/family education    PT Goals (Current goals can be found in the Care Plan section)  Acute Rehab PT Goals Patient Stated Goal: To go home PT Goal Formulation: With patient Time For Goal Achievement: 09/06/16 Potential to Achieve Goals: Good    Frequency 7X/week   Barriers to discharge        Co-evaluation               End of Session Equipment Utilized During Treatment: Gait belt Activity Tolerance: Patient tolerated treatment well Patient left: in bed;with family/visitor present Nurse Communication: Mobility status PT Visit Diagnosis: Unsteadiness on feet (R26.81);Other abnormalities of gait and mobility (R26.89);Pain Pain - Right/Left: Right Pain - part of body: Ankle and joints of foot    Time: 3710-6269 PT Time Calculation (min) (ACUTE ONLY): 21 min   Charges:   PT Evaluation $PT Eval Low Complexity: 1 Procedure     PT G Codes:          Bertram Denver, PT, DPT, CWCE 08/23/2016, 1:55 PM

## 2016-08-23 NOTE — Progress Notes (Signed)
VASCULAR SURGERY  Patient with gangrene of right toes 3/4 s/p amputation. Noted to have minimal blood flow intraoperatively.  Atherosclerosis of right lower extremity. Concern of potential poor perfusion to heal current amputation sites.  Will require angiogram for further evaluation and intervention.  However, patients Cr is rising- now 2.16.  Recommend gentle hydration. Avoid nephrotoxic meds.  Will tentatively make NPO for potential angio tomorrow per Dr. Lucky Cowboy.

## 2016-08-23 NOTE — Progress Notes (Signed)
[facility] [place] NAME: Paul [name]    MR#:  [id number]  DATE OF BIRTH:  [birth year]  SUBJECTIVE:  CHIEF COMPLAINT:   Chief Complaint  Patient presents with  . Foot Pain   - Status post right foot debridement done for osteomyelitis and third and fourth toes amputation. -Patient complains of significant hand pain and right elbow pain from gout exacerbation  REVIEW OF SYSTEMS:  Review of Systems  Constitutional: Negative for chills, fever and malaise/fatigue.  HENT: Negative for ear discharge, ear pain, hearing loss and nosebleeds.   Eyes: Negative for blurred vision and double vision.  Respiratory: Negative for cough, shortness of breath and wheezing.   Cardiovascular: Positive for leg swelling. Negative for chest pain and palpitations.  Gastrointestinal: Negative for abdominal pain, constipation, diarrhea, nausea and vomiting.  Genitourinary: Negative for dysuria.  Musculoskeletal: Positive for joint pain and myalgias.  Neurological: Negative for dizziness, speech change, focal weakness, seizures and headaches.  Psychiatric/Behavioral: Negative for depression.    DRUG ALLERGIES:   Allergies  Allergen Reactions  . Ibuprofen Other (See Comments)    Heart problems    VITALS:  Blood pressure (!) 155/77, pulse 90, temperature 99.3 F (37.4 C), temperature source Oral, resp. rate 17, height 5\' 10"  (1.778 m), weight 120.3 kg (265 lb 3.2 oz), SpO2 98 %.  PHYSICAL EXAMINATION:  Physical Exam  GENERAL:  51 y.o.-year-old patient lying in the bed with no acute distress.  EYES: Pupils equal, round, reactive to light and accommodation. No scleral icterus. Extraocular muscles intact.  HEENT: Head atraumatic, normocephalic. Oropharynx and nasopharynx clear.  NECK:  Supple, no jugular venous distention. No thyroid enlargement, no tenderness.  LUNGS: Normal breath sounds bilaterally, no wheezing, rales,rhonchi or crepitation. No use of  accessory muscles of respiration.  CARDIOVASCULAR: S1, S2 normal. No murmurs, rubs, or gallops. AICD in place ABDOMEN: Soft, nontender, nondistended. Bowel sounds present. No organomegaly or mass.  EXTREMITIES: No  cyanosis, or clubbing. 2+ Lower extremity edema. 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 Wound VAC in place. Tender right elbow and also both hands dorsal side. No tophi or swelling or erythema noted. NEUROLOGIC: Cranial nerves II through XII are intact. Muscle strength 5/5 in all extremities. Sensation intact. Gait not checked.  PSYCHIATRIC: The patient is alert and oriented x 3.  SKIN: No obvious rash, lesion, or ulcer.    LABORATORY PANEL:   CBC  Recent Labs Lab 08/23/16 0336  WBC 15.8*  HGB 9.6*  HCT 29.8*  PLT 237   ------------------------------------------------------------------------------------------------------------------  Chemistries   Recent Labs Lab 08/21/16 2306  08/23/16 0336  NA 133*  < > 134*  K 3.8  < > 3.8  CL 102  < > 101  CO2 22  < > 23  GLUCOSE 262*  < > 118*  BUN 45*  < > 42*  CREATININE 2.03*  < > 2.16*  CALCIUM 8.4*  < > 8.3*  AST 35  --   --   ALT 21  --   --   ALKPHOS 133*  --   --   BILITOT 0.8  --   --   < > = values in this interval not displayed. ------------------------------------------------------------------------------------------------------------------  Cardiac Enzymes No results for input(s): TROPONINI in the last 168 hours. ------------------------------------------------------------------------------------------------------------------  RADIOLOGY:  Dg Foot Complete Right  Result Date: 08/21/2016 CLINICAL DATA:  Weeping sores to the bottom of the right foot and third and fourth  digits beginning 3 days ago. EXAM: RIGHT FOOT COMPLETE - 3+ VIEW COMPARISON:  03/29/2014 FINDINGS: Right foot appears intact No evidence of acute fracture or subluxation. No focal bone lesion or bone destruction. Bone  cortex and trabecular architecture appear intact. No radiopaque soft tissue foreign bodies. No soft tissue gas collections. Mild dorsal soft tissue swelling. Prominent vascular calcifications. IMPRESSION: Dorsal soft tissue swelling. No radiopaque foreign bodies or gas collections. No acute bony abnormalities. Electronically Signed   By: Lucienne Capers M.D.   On: 08/21/2016 22:14    EKG:   Orders placed or performed during the hospital encounter of 08/21/16  . ED EKG  . ED EKG  . EKG 12-Lead  . EKG 12-Lead    ASSESSMENT AND PLAN:   51 y/o M with PMH significant for CAD status post CABG, ischemic cardiomyopathy s/p AICD, hypertension, IDDM, CKD admitted to hospital secondary to right foot ulcer with purulent discharge.  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 -Intraoperative cultures are pending - continue broad spectrum ABX with vancomycin and zosyn - Factious disease consult requested. -Had poor blood flow during the surgery, so vascular has been consulted. Patient might need angiogram. Possible angiogram tomorrow.  #2 CAD s/p CABG- stable, no active cardiac symptoms - cardiac clearance requested  #3 Systolic CHF- cardiomyopathy ischemia- stable, well compensated. Continue torsemide orally Cardiac clearance requested s/p AICD On asa, coreg and aldactone. Reaction to lisinopril in the past. Outpatient plans to start ARB  #4 DM- restarted lantus and on ssi  #5 CK D stage III-Baseline creatinine seems to be around 1.8. Slight worsening noted at this time. Gentle hydration and avoid nephrotoxins for today. As patient will need angiogram tomorrow  #6 DVT Prophylaxis- SQ heparin   All the records are reviewed and case discussed with Care Management/Social Workerr. Management plans discussed with the patient, family and they are in agreement.  CODE  STATUS: Full Code  TOTAL TIME TAKING CARE OF THIS PATIENT: 38 minutes.   POSSIBLE D/C IN 2-3 DAYS, DEPENDING ON CLINICAL CONDITION.   Gladstone Lighter M.D on 08/23/2016 at 11:50 AM  Between 7am to 6pm - Pager - [phone]  After 6pm go to [email or web address] - password [email or web address] [facility]  Office  [phone]  CC: Primary care physician; Kandice Hams, MD

## 2016-08-23 NOTE — Progress Notes (Signed)
Daily Progress Note   Subjective  - 1 Day Post-Op  f/u right foot debridement with 3,4 toe amputation.  No foot complaints.  C/O worsening hand and elbow pain with longstanding hx of gout.  Objective Vitals:   08/22/16 2239 08/23/16 0224 08/23/16 0530 08/23/16 0715  BP:  114/62 126/70 (!) 155/77  Pulse:  84 83 90  Resp:  16 18 17   Temp: 99.8 F (37.7 C) 99.3 F (37.4 C) 99.7 F (37.6 C) 100.1 F (37.8 C)  TempSrc: Oral Oral Oral Oral  SpO2:  95% 98% 98%  Weight:      Height:        Physical Exam: NAD.  C/O some chills.  Temp 100.1  Wound vac intact to right foot.  Good seal.   Laboratory CBC    Component Value Date/Time   WBC 15.8 (H) 08/23/2016 0336   HGB 9.6 (L) 08/23/2016 0336   HCT 29.8 (L) 08/23/2016 0336   PLT 237 08/23/2016 0336    BMET    Component Value Date/Time   NA 134 (L) 08/23/2016 0336   NA 139 06/23/2016 1553   K 3.8 08/23/2016 0336   CL 101 08/23/2016 0336   CO2 23 08/23/2016 0336   GLUCOSE 118 (H) 08/23/2016 0336   BUN 42 (H) 08/23/2016 0336   BUN 46 (H) 06/23/2016 1553   CREATININE 2.16 (H) 08/23/2016 0336   CALCIUM 8.3 (L) 08/23/2016 0336   GFRNONAA 34 (L) 08/23/2016 0336   GFRAA 39 (L) 08/23/2016 0336   Results for orders placed or performed during the hospital encounter of 08/21/16  Culture, blood (routine x 2)     Status: None (Preliminary result)   Collection Time: 08/21/16 11:05 PM  Result Value Ref Range Status   Specimen Description BLOOD RIGHT WRIST  Final   Special Requests   Final    BOTTLES DRAWN AEROBIC AND ANAEROBIC Blood Culture adequate volume   Culture NO GROWTH < 12 HOURS  Final   Report Status PENDING  Incomplete  Culture, blood (routine x 2)     Status: None (Preliminary result)   Collection Time: 08/22/16 12:13 AM  Result Value Ref Range Status   Specimen Description BLOOD RIGHT HAND  Final   Special Requests   Final    BOTTLES DRAWN AEROBIC AND ANAEROBIC Blood Culture adequate volume   Culture NO GROWTH <  12 HOURS  Final   Report Status PENDING  Incomplete  Aerobic/Anaerobic Culture (surgical/deep wound)     Status: None (Preliminary result)   Collection Time: 08/22/16  9:15 AM  Result Value Ref Range Status   Specimen Description WOUND Toe R  Final   Special Requests Immunocompromised  Final   Gram Stain   Final    FEW WBC PRESENT, PREDOMINANTLY PMN RARE SQUAMOUS EPITHELIAL CELLS PRESENT ABUNDANT GRAM POSITIVE COCCI IN PAIRS ABUNDANT GRAM NEGATIVE RODS Performed at [facility], [place] [address] [place], [place] [address]    Culture PENDING  Incomplete   Report Status PENDING  Incomplete  Surgical pcr screen     Status: None   Collection Time: 08/22/16  9:16 AM  Result Value Ref Range Status   MRSA, PCR NEGATIVE NEGATIVE Final   Staphylococcus aureus NEGATIVE NEGATIVE Final    Comment:        The Xpert SA Assay (FDA approved for NASAL specimens in patients over 55 years of age), is one component of a comprehensive surveillance program.  Test performance has been validated by [facility] for patients greater than or equal to 46 year old. It is not intended to diagnose infection nor to guide or monitor treatment.   Aerobic/Anaerobic Culture (surgical/deep wound)     Status: None (Preliminary result)   Collection Time: 08/22/16  4:26 PM  Result Value Ref Range Status   Specimen Description ABSCESS RIGHT FOOT  Final   Special Requests PATIENT ON FOLLOWING ZOSYN VANCOMYCIN  Final   Gram Stain   Final    RARE WBC PRESENT, PREDOMINANTLY PMN ABUNDANT GRAM POSITIVE COCCI IN PAIRS ABUNDANT GRAM NEGATIVE COCCOBACILLI ABUNDANT GRAM POSITIVE RODS Performed at [facility], [place] [address] [place], [place] [address]    Culture PENDING  Incomplete   Report Status PENDING  Incomplete    Assessment/Planning: Abscess with necrosis right forefoot. DM with PVD and neuropathy   Wound vac left intact.    Will plan to change Tuesday.  Awaiting Vascular for  angio/revasc.  Will likely need further surgery to close wound.  Paul [name] A  08/23/2016, 7:56 AM

## 2016-08-24 ENCOUNTER — Encounter
Admission: EM | Disposition: A | Discharge status: Home Health | Payer: Self-pay | Source: Home / Self Care | Attending: Internal Medicine

## 2016-08-24 DIAGNOSIS — I70238 Atherosclerosis of native arteries of right leg with ulceration of other part of lower right leg: Secondary | ICD-10-CM

## 2016-08-24 HISTORY — PX: LOWER EXTREMITY ANGIOGRAPHY: CATH118251

## 2016-08-24 LAB — GLUCOSE, CAPILLARY
GLUCOSE-CAPILLARY: 302 mg/dL — AB (ref 65–99)
GLUCOSE-CAPILLARY: 171 mg/dL — AB (ref 65–99)
Glucose-Capillary: 260 mg/dL — ABNORMAL HIGH (ref 65–99)
Glucose-Capillary: 194 mg/dL — ABNORMAL HIGH (ref 65–99)

## 2016-08-24 LAB — BASIC METABOLIC PANEL
Anion gap: 12 (ref 5–15)
BUN: 44 mg/dL — ABNORMAL HIGH (ref 6–20)
CALCIUM: 8.3 mg/dL — AB (ref 8.9–10.3)
CO2: 23 mmol/L (ref 22–32)
CREATININE: 2.19 mg/dL — AB (ref 0.61–1.24)
Chloride: 98 mmol/L — ABNORMAL LOW (ref 101–111)
GFR calc non Af Amer: 33 mL/min — ABNORMAL LOW (ref >60)
GFR, EST AFRICAN AMERICAN: 38 mL/min — AB (ref >60)
Glucose, Bld: 300 mg/dL — ABNORMAL HIGH (ref 65–99)
Potassium: 4.1 mmol/L (ref 3.5–5.1)
SODIUM: 133 mmol/L — AB (ref 135–145)

## 2016-08-24 LAB — MAGNESIUM: Magnesium: 1.4 mg/dL — ABNORMAL LOW (ref 1.7–2.4)

## 2016-08-24 LAB — VANCOMYCIN, TROUGH: VANCOMYCIN TR: 23 ug/mL — AB (ref 15–20)

## 2016-08-24 SURGERY — LOWER EXTREMITY ANGIOGRAPHY
Anesthesia: Moderate Sedation | Laterality: Right

## 2016-08-24 MED ORDER — HEPARIN SODIUM (PORCINE) 1000 UNIT/ML IJ SOLN
INTRAMUSCULAR | Status: DC | PRN
  Administered 2016-08-24: 5000 [IU] via INTRAVENOUS

## 2016-08-24 MED ORDER — INSULIN GLARGINE 100 UNIT/ML ~~LOC~~ SOLN
10.0000 [IU] | Freq: Every day | SUBCUTANEOUS | Status: DC
  Administered 2016-08-24: 10 [IU] via SUBCUTANEOUS
  Filled 2016-08-24 (×2): qty 0.1

## 2016-08-24 MED ORDER — FENTANYL CITRATE (PF) 100 MCG/2ML IJ SOLN
INTRAMUSCULAR | Status: DC | PRN
  Administered 2016-08-24 (×2): 25 ug via INTRAVENOUS
  Administered 2016-08-24: 50 ug

## 2016-08-24 MED ORDER — MAGNESIUM SULFATE 4 GM/100ML IV SOLN
4.0000 g | Freq: Once | INTRAVENOUS | Status: AC
  Administered 2016-08-24: 4 g via INTRAVENOUS
  Filled 2016-08-24: qty 100

## 2016-08-24 MED ORDER — MORPHINE SULFATE (PF) 2 MG/ML IV SOLN
INTRAVENOUS | Status: AC
  Filled 2016-08-24: qty 1

## 2016-08-24 MED ORDER — SODIUM CHLORIDE 0.9 % IV SOLN: INTRAVENOUS | Status: DC

## 2016-08-24 MED ORDER — INSULIN ASPART 100 UNIT/ML ~~LOC~~ SOLN
3.0000 [IU] | Freq: Three times a day (TID) | SUBCUTANEOUS | Status: DC
  Administered 2016-08-24 – 2016-08-26 (×7): 3 [IU] via SUBCUTANEOUS
  Filled 2016-08-24 (×7): qty 3

## 2016-08-24 MED ORDER — LIDOCAINE-EPINEPHRINE (PF) 2 %-1:200000 IJ SOLN
INTRAMUSCULAR | Status: AC
  Filled 2016-08-24: qty 20

## 2016-08-24 MED ORDER — SODIUM CHLORIDE 0.9 % IV SOLN
1.0000 g | Freq: Once | INTRAVENOUS | Status: AC
  Administered 2016-08-24: 1 g via INTRAVENOUS
  Filled 2016-08-24: qty 10

## 2016-08-24 MED ORDER — HEPARIN SODIUM (PORCINE) 1000 UNIT/ML IJ SOLN
INTRAMUSCULAR | Status: AC
  Filled 2016-08-24: qty 1

## 2016-08-24 MED ORDER — MIDAZOLAM HCL 5 MG/5ML IJ SOLN
INTRAMUSCULAR | Status: AC
  Filled 2016-08-24: qty 5

## 2016-08-24 MED ORDER — LIVING WELL WITH DIABETES BOOK
Freq: Once | Status: AC
  Administered 2016-08-24: 14:00:00
  Filled 2016-08-24: qty 1

## 2016-08-24 MED ORDER — HEPARIN (PORCINE) IN NACL 2-0.9 UNIT/ML-% IJ SOLN
INTRAMUSCULAR | Status: AC
  Filled 2016-08-24: qty 1000

## 2016-08-24 MED ORDER — FENTANYL CITRATE (PF) 100 MCG/2ML IJ SOLN
INTRAMUSCULAR | Status: AC
  Filled 2016-08-24: qty 2

## 2016-08-24 MED ORDER — VANCOMYCIN HCL 10 G IV SOLR
1250.0000 mg | INTRAVENOUS | Status: DC
  Administered 2016-08-25 – 2016-08-26 (×2): 1250 mg via INTRAVENOUS
  Filled 2016-08-24 (×2): qty 1250

## 2016-08-24 MED ORDER — INDOMETHACIN 50 MG PO CAPS
50.0000 mg | ORAL_CAPSULE | Freq: Three times a day (TID) | ORAL | Status: DC
  Administered 2016-08-24 – 2016-08-25 (×4): 50 mg via ORAL
  Filled 2016-08-24 (×4): qty 1

## 2016-08-24 MED ORDER — MIDAZOLAM HCL 2 MG/2ML IJ SOLN
INTRAMUSCULAR | Status: DC | PRN
  Administered 2016-08-24: 2 mg via INTRAVENOUS

## 2016-08-24 MED ORDER — MIDAZOLAM HCL 2 MG/2ML IJ SOLN
INTRAMUSCULAR | Status: DC | PRN
  Administered 2016-08-24 (×2): 1 mg via INTRAVENOUS

## 2016-08-24 SURGICAL SUPPLY — 18 items
BALLN LUTONIX 5X150X130 (BALLOONS) ×3
BALLN ULTRVRSE 2X220X150 (BALLOONS) ×3
BALLN ULTRVRSE 3X150X150 (BALLOONS) ×3
BALLOON LUTONIX 5X150X130 (BALLOONS) ×1 IMPLANT
BALLOON ULTRVRSE 2X220X150 (BALLOONS) ×1 IMPLANT
BALLOON ULTRVRSE 3X150X150 (BALLOONS) ×1 IMPLANT
CATH CXI SUPP ANG 4FR 135 (MICROCATHETER) ×1 IMPLANT
CATH CXI SUPP ANG 4FR 135CM (MICROCATHETER) ×3
CATH PIG 70CM (CATHETERS) ×3 IMPLANT
CATH VERT 100CM (CATHETERS) ×3 IMPLANT
DEVICE PRESTO INFLATION (MISCELLANEOUS) ×3 IMPLANT
DEVICE STARCLOSE SE CLOSURE (Vascular Products) ×3 IMPLANT
GLIDEWIRE ADV .035X260CM (WIRE) ×3 IMPLANT
GUIDEWIRE PFTE-COATED .018X300 (WIRE) ×3 IMPLANT
PACK ANGIOGRAPHY (CUSTOM PROCEDURE TRAY) ×3 IMPLANT
SHEATH ANL2 6FRX45 HC (SHEATH) ×3 IMPLANT
SHEATH BRITE TIP 5FRX11 (SHEATH) ×3 IMPLANT
WIRE J 3MM .035X145CM (WIRE) ×3 IMPLANT

## 2016-08-24 NOTE — Progress Notes (Signed)
[facility] [place] NAME: Paul [name]    MR#:  [id number]  DATE OF BIRTH:  26-Nov-1965  SUBJECTIVE:  CHIEF COMPLAINT:   Chief Complaint  Patient presents with  . Foot Pain   - for angiogram today - complains of gout pain in right elbow and both hands  REVIEW OF SYSTEMS:  Review of Systems  Constitutional: Negative for chills, fever and malaise/fatigue.  HENT: Negative for ear discharge, ear pain, hearing loss and nosebleeds.   Eyes: Negative for blurred vision and double vision.  Respiratory: Negative for cough, shortness of breath and wheezing.   Cardiovascular: Positive for leg swelling. Negative for chest pain and palpitations.  Gastrointestinal: Negative for abdominal pain, constipation, diarrhea, nausea and vomiting.  Genitourinary: Negative for dysuria.  Musculoskeletal: Positive for joint pain and myalgias.  Neurological: Negative for dizziness, speech change, focal weakness, seizures and headaches.  Psychiatric/Behavioral: Negative for depression.    DRUG ALLERGIES:   Allergies  Allergen Reactions  . Ibuprofen Other (See Comments)    Heart problems    VITALS:  Blood pressure (!) 148/84, pulse 84, temperature 97.3 F (36.3 C), temperature source Oral, resp. rate 16, height 5\' 10"  (1.778 m), weight 120.3 kg (265 lb 3.2 oz), SpO2 100 %.  PHYSICAL EXAMINATION:  Physical Exam  GENERAL:  51 y.o.-year-old patient lying in the bed with no acute distress.  EYES: Pupils equal, round, reactive to light and accommodation. No scleral icterus. Extraocular muscles intact.  HEENT: Head atraumatic, normocephalic. Oropharynx and nasopharynx clear.  NECK:  Supple, no jugular venous distention. No thyroid enlargement, no tenderness.  LUNGS: Normal breath sounds bilaterally, no wheezing, rales,rhonchi or crepitation. No use of accessory muscles of respiration.  CARDIOVASCULAR: S1, S2 normal. No murmurs, rubs, or gallops. AICD in  place ABDOMEN: Soft, nontender, nondistended. Bowel sounds present. No organomegaly or mass.  EXTREMITIES: No  cyanosis, or clubbing. 2+ Lower extremity edema. 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 Wound VAC in place. Tender right elbow and also both hands dorsal side. No tophi or swelling or erythema noted. NEUROLOGIC: Cranial nerves II through XII are intact. Muscle strength 5/5 in all extremities. Sensation intact. Gait not checked.  PSYCHIATRIC: The patient is alert and oriented x 3.  SKIN: No obvious rash, lesion, or ulcer.    LABORATORY PANEL:   CBC  Recent Labs Lab 08/23/16 0336  WBC 15.8*  HGB 9.6*  HCT 29.8*  PLT 237   ------------------------------------------------------------------------------------------------------------------  Chemistries   Recent Labs Lab 08/21/16 2306  08/24/16 0344  NA 133*  < > 133*  K 3.8  < > 4.1  CL 102  < > 98*  CO2 22  < > 23  GLUCOSE 262*  < > 300*  BUN 45*  < > 44*  CREATININE 2.03*  < > 2.19*  CALCIUM 8.4*  < > 8.3*  AST 35  --   --   ALT 21  --   --   ALKPHOS 133*  --   --   BILITOT 0.8  --   --   < > = values in this interval not displayed. ------------------------------------------------------------------------------------------------------------------  Cardiac Enzymes No results for input(s): TROPONINI in the last 168 hours. ------------------------------------------------------------------------------------------------------------------  RADIOLOGY:  No results found.  EKG:   Orders placed or performed during the hospital encounter of 08/21/16  . ED EKG  . ED EKG  . EKG 12-Lead  . EKG 12-Lead    ASSESSMENT  AND PLAN:   51 y/o M with PMH significant for CAD status post CABG, ischemic cardiomyopathy s/p AICD, hypertension, IDDM, CKD admitted to hospital secondary to right foot ulcer with purulent discharge.  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 -Intraoperative cultures are pending - continue broad spectrum ABX with vancomycin and zosyn - Infectious disease consult requested. -Had poor blood flow during the surgery, so vascular has been consulted. Patient will get angiogram today.  #2 CAD s/p CABG- stable, no active cardiac symptoms - cardiac clearance appreciated. No active symptoms  #3 Systolic CHF- cardiomyopathy ischemia- stable, well compensated. Continue torsemide orally Cardiac clearance appreciated s/p AICD On asa, coreg and aldactone. Reaction to lisinopril in the past. Outpatient plans to start ARB if renal function improves  #4 Gout- known h/o gout- acute gouty attack now. On colchicine and prednisone- says only his Indocin helps- but helps to prevent bleeding prior to angiogram- will restart it tonight  #5 CKD stage III-Baseline creatinine seems to be around 1.8. Slight worsening noted at this time. Gentle hydration and avoid nephrotoxins for now As patient will need angiogram tomorrow  #6 DVT Prophylaxis- SQ heparin   All the records are reviewed and case discussed with Care Management/Social Workerr. Management plans discussed with the patient, family and they are in agreement.  CODE STATUS: Full Code   TOTAL TIME TAKING CARE OF THIS PATIENT: 37 minutes.   POSSIBLE D/C IN 2-3 DAYS, DEPENDING ON CLINICAL CONDITION.   Gladstone Lighter M.D on 08/24/2016 at 12:58 PM  Between 7am to 6pm - Pager - [phone]  After 6pm go to [email or web address] - password [email or web address] [facility]  Office  [phone]  CC: Primary care physician; Kandice Hams, MD

## 2016-08-24 NOTE — Clinical Social Work Note (Signed)
Clinical Social Work Assessment  Patient Details  Name: Paul [name]. MRN: [id number] Date of Birth: Jun 30, 1965  Date of referral:  08/24/16               Reason for consult:  [employer] sought to share information with:  Case Optician, dispensing granted to share information::  Yes, Verbal Permission Granted  Name::        Agency::     Relationship::     Contact Information:     Housing/Transportation Living arrangements for the past 2 months:  Single Family Home Source of Information:  Patient Patient Interpreter Needed:  None Criminal Activity/Legal Involvement Pertinent to Current Situation/Hospitalization:  No - Comment as needed Significant Relationships:  Siblings, Parents Lives with:  Self Do you feel safe going back to the place where you live?  Yes Need for family participation in patient care:  Yes (Comment)  Care giving concerns:  Patient lives alone in [place].    Facilities manager / plan:  Holiday representative ([place]) received verbal consult that patient wants information on medicaid. CSW met with patient and his mother Paul [name] was at bedside. Patient was alert and oriented X4. CSW introduced self and explained role of CSW department. Patient reported that he lives alone in [place] and is going to be out of work for a few months. Patient asked how to apply for medicaid and food stamps. CSW provided patient with [facility] [employer]. CSW explained that patient can apply for [employer] and food stamps at the [facility] in [place]. Patient reported the plan is to D/C home with home health. RN case manager aware of above. Patient reported no other needs or concerns at this time. CSW will continue to follow and assist as needed.   Employment status:  Engineer, mining information:  Medicare PT Recommendations:  Home with [place] / Referral to community resources:  Other (Comment Required)  [facility])  Patient/Family's Response to care:  Patient is agreeable to D/C home.   Patient/Family's Understanding of and Emotional Response to Diagnosis, Current Treatment, and Prognosis:  Patient was very pleasant and thanked CSW for visit.   Emotional Assessment Appearance:  Appears stated age Attitude/Demeanor/Rapport:    Affect (typically observed):  Accepting, Adaptable, Pleasant Orientation:  Oriented to Self, Oriented to Place, Oriented to  Time, Oriented to Situation Alcohol / Substance use:  Not Applicable Psych involvement (Current and /or in the community):  No (Comment)  Discharge Needs  Concerns to be addressed:  Discharge Planning Concerns Readmission within the last 30 days:  No Current discharge risk:  None Barriers to Discharge:  Continued Medical Work up   [employer], Veronia Beets, LCSW 08/24/2016, 4:43 PM

## 2016-08-24 NOTE — Progress Notes (Signed)
Pt with runs of V-tach spoke with Dr. Jannifer Franklin. Md to place orders.

## 2016-08-24 NOTE — Op Note (Signed)
Paul [name] Percutaneous Study/Intervention Procedural Note   Date of Surgery: 08/21/2016 - 08/24/2016  Surgeon(s):Brayten Komar   Assistants:none  Pre-operative Diagnosis: PAD with ulceration and infection right foot  Post-operative diagnosis: Same  Procedure(s) Performed: 1. Ultrasound guidance for vascular access left femoral artery 2. Catheter placement into right peroneal artery from left femoral approach 3. Aortogram and selective right lower extremity angiogram 4. Percutaneous transluminal angioplasty of of the right peroneal artery with 2 mm diameter angioplasty balloon distally and of the right peroneal artery and tibioperoneal trunk with a 3 mm diameter angioplasty balloon proximally 5. Percutaneous transluminal angioplasty of the distal SFA and proximal popliteal artery with 5 mm diameter by 15 cm length Lutonix drug-coated angioplasty balloon  6.  StarClose closure device left femoral artery  EBL: Minimal  Contrast: 65 cc  Fluoro Time: 6.9 minutes  Moderate Conscious Sedation Time: approximately 40 minutes using 4 mg of Versed and 100 mcg of Fentanyl  Indications: Patient is a 51 y.o.male with ulceration and infection of the right foot with no palpable pedal pulses on exam. The patient is brought in for angiography for further evaluation and potential treatment. Risks and benefits are discussed and informed consent is obtained  Procedure: The patient was identified and appropriate procedural time out was performed. The patient was then placed supine on the table and prepped and draped in the usual sterile fashion.Moderate conscious sedation was administered during a face to face encounter with the patient throughout the procedure with my supervision of the RN administering medicines and monitoring the patient's vital signs, pulse oximetry, telemetry and mental status  throughout from the start of the procedure until the patient was taken to the recovery room. Ultrasound was used to evaluate the left common femoral artery. It was patent . A digital ultrasound image was acquired. A Seldinger needle was used to access the left common femoral artery under direct ultrasound guidance and a permanent image was performed. A 0.035 J wire was advanced without resistance and a 5Fr sheath was placed. Pigtail catheter was placed into the aorta and an AP aortogram was performed. This demonstrated normal renal arteries and normal aorta and iliac segments without significant stenosis. I then crossed the aortic bifurcation and advanced to the right femoral head. Selective right lower extremity angiogram was then performed. This demonstrated Normal common femoral artery, profunda femoris artery, and proximal superficial femoral artery. In the distal superficial femoral artery just above Hunter's canal was a short segment occlusion with reconstitution of the popliteal artery above the knee. There was then a high-grade near occlusive stenosis in the tibioperoneal trunk and proximal peroneal artery. The posterior tibial artery appeared chronically occluded. The anterior tibial artery was patent into the foot. The patient was systemically heparinized and a 6 [place] Ansell sheath was then placed over the [employer] wire. I then used a Kumpe catheter and the advantage wire to navigate through the SFA/popliteal occlusion and confirm intraluminal flow in the popliteal artery below the knee. I then exchanged for a 0.018 wire and a CXI catheter and navigated through the tibioperoneal trunk and proximal peroneal stenosis confirming intraluminal flow in the mid to distal peroneal artery. I then replaced an 018 wire. I proceeded with treatment. The SFA and popliteal lesion were dressed with a 5 mm diameter by 15 cm length Lutonix drug-coated angioplasty balloon. This was inflated to 12 atm for 1  minute. Used a 3 mm diameter by 15 cm length angioplasty balloon for the tibioperoneal trunk and  proximal peroneal artery. This was inflated to 10 atm for 1 minute. Completion angiogram showed less than 20% residual stenosis in the SFA/popliteal lesion with great result from angioplasty. The tibioperoneal trunk and proximal peroneal artery now. Patent with less than 30% stenosis, but there was residual disease in the mid and distal peroneal artery that appeared near occlusive. I treated this area with a 2 mm diameter by 20 cm length angioplasty balloon inflated from just above the ankle up to the mid to proximal peroneal artery. Completion angiogram was difficult due to vasospasm, but there did not appear to be a greater than 50% stenosis with more brisk flow distally. The anterior tibial artery continued to have the best runoff to the foot. I felt we had markedly improved the circulation at this point. I elected to terminate the procedure. The sheath was removed and StarClose closure device was deployed in the left femoral artery with excellent hemostatic result. The patient was taken to the recovery room in stable condition having tolerated the procedure well.  Findings:  Aortogram: Normal renal arteries, normal aorta and iliac arteries Right Lower Extremity: Normal common femoral artery, profunda femoris artery, and proximal superficial femoral artery. In the distal superficial femoral artery just above Hunter's canal was a short segment occlusion with reconstitution of the popliteal artery above the knee. There was then a high-grade near occlusive stenosis in the tibioperoneal trunk and proximal peroneal artery. The posterior tibial artery appeared chronically occluded. The anterior tibial artery was patent into the foot.   Disposition: Patient was taken to the recovery room in stable condition having tolerated the procedure well.  Complications: None  Leotis Pain 08/24/2016 5:23 PM   This note was created with Dragon Medical transcription system. Any errors in dictation are purely unintentional.

## 2016-08-24 NOTE — Progress Notes (Addendum)
Inpatient Diabetes Program Recommendations  AACE/ADA: New Consensus Statement on Inpatient Glycemic Control (2015)  Target Ranges:  Prepandial:   less than 140 mg/dL      Peak postprandial:   less than 180 mg/dL (1-2 hours)      Critically ill patients:  140 - 180 mg/dL  Results for Paul [name], Paul [name] (MRN [id number]) as of 08/24/2016 08:00  Ref. Range 08/23/2016 07:43 08/23/2016 11:16 08/23/2016 16:30 08/23/2016 21:47 08/24/2016 07:35  Glucose-Capillary Latest Ref Range: 65 - 99 mg/dL 113 (H) 150 (H) 218 (H) 269 (H) 302 (H)   Results for Paul [name], Paul [name] (MRN [id number]) as of 08/24/2016 08:00  Ref. Range 08/22/2016 04:02  Hemoglobin A1C Latest Ref Range: 4.8 - 5.6 % 11.2 (H)   Review of Glycemic Control  Diabetes history: DM2 Outpatient Diabetes medications: Lantus 25 units QHS Current orders for Inpatient glycemic control: Novolog 0-9 units TID with meals, Novolog 0-5 units QHS  Inpatient Diabetes Program Recommendations: Insulin - Basal: If Prednisone is continued as ordered, please consider ordering Lantus 8 units Q24H. Insulin - Meal Coverage: If Prednisone is continued as ordered, please consider ordering Novolog 3 units TID with meals for meal coverage if patient eats at least 50% of meals. HgbA1C: A1C 11.2% on 08/22/16 indicating an average glucose of 275 mg/dl over the past 2-3 months. Patient needs to follow up with PCP regarding DM control.  Addendum 08/24/16@13 :30-Spoke with patient and his mother about diabetes and home regimen for diabetes control. Patient reports that he is followed by PCP for diabetes management and currently he takes Lantus 25 units QHS as an outpatient for diabetes control. Patient reports that he is taking insulin as prescribed and that he has been taking that same dosage for "quite a while". Patient states that his PCP has tried him on Metformin in the past but he experienced constant diarrhea with Metformin despite taking at various times and trying half dosages  at a time.  Patient states he can not tolerate Metformin and will not take it due to severe GI side effect. Patient states that he just recently got a glucometer and testing supplies and prior to that he was not monitoring glucose.  Discussed A1C results (11.2% on 08/22/16) and explained that his current A1C indicates an average glucose of 275 mg/dl over the past 2-3 months. Discussed glucose and A1C goals. Discussed importance of checking CBGs and maintaining good CBG control to prevent long-term and short-term complications. Explained how hyperglycemia leads to damage within blood vessels which lead to the common complications seen with uncontrolled diabetes. Stressed to the patient the importance of improving glycemic control to prevent further complications from uncontrolled diabetes and especially for wound healing. Discussed impact of nutrition, exercise, stress, sickness, and medications on diabetes control. Patient states that he has been on steroids recently for gout.  Patient states that he was not following a strict Carb Modified diet but he is very willing to make dietary changes to improve DM control and his mother is going to help him by helping to prepare his meals.  Discussed carbohydrates, carbohydrate goals per day and meal, along with portion sizes. Patient reports that he works 14 hours per week and he was staying away from sodas at home but on the days he works he admits that he was drinking regular Coke because he felt he was sluggish and drinking Coke helped him feel like he had energy. Discussed hyperglycemia in more detail and symptoms. Question if patient had a perception  of feelign better due to the caffeine in the Coke.  Patient has lots of questions related to diet to accommodate (DM, hypertension, gout); placed consult for RD to provide additional diet education and provide handout information on diet and meal plans. Ordered Living Well with DM book and encouraged patient to read entire  book when received.  Encouraged patient to check his glucose as prescribed and to keep a log book of glucose readings and insulin taken which he will need to take to doctor appointments. Explained how the doctor he follows up with can use the log book to continue to make insulin adjustments if needed. Patient is willing to take Novolog insulin as well if needed as an outpatient. Encouraged patient to talk with his PCP about being referred to local Endocrinologist to further assistance with getting DM controlled.  Patient expressed appreciation for DM education provided. Patient verbalized understanding of information discussed and he states that he has no further questions at this time related to diabetes.  Thanks, Barnie Alderman, RN, MSN, CDE Diabetes Coordinator Inpatient Diabetes Program [phone] (Team Pager from 8am to 5pm)

## 2016-08-24 NOTE — Progress Notes (Signed)
Physical Therapy Treatment Patient Details Name: Paul [name]. MRN: [id number] DOB: December 20, 1965 Today's Date: 08/24/2016    History of Present Illness Pt. is a 51 year old male admitted to [facility] on 08/21/16 after 4 days of increased R foot pain. 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     PT Comments    Pt agreeable to PT for bed exercises only. Pt does not wish up in/out of bed at this time, as he will be going to another procedure shortly. Pt reports pain in R knee as 8/10; pain more so in knee than foot. Pt does note pain decreases post exercise session. Pt participates in supine bed exercises and encouraged to do so 3-5 times a day for strengthening and pain management. Continue PT to progress strength, endurance and improve all functional mobility.    Follow Up Recommendations  Home health PT     Equipment Recommendations  Rolling walker with 5" wheels    Recommendations for Other Services       Precautions / Restrictions Precautions Precautions: Fall Restrictions Weight Bearing Restrictions: Yes RLE Weight Bearing: Non weight bearing    Mobility  Bed Mobility Overal bed mobility: Modified Independent             General bed mobility comments: use of trapeze to re adjust upward in bed. Refuses out of bed curently; going for another procedure in 1.5 hours  Transfers                    Ambulation/Gait                 Stairs            Wheelchair Mobility    Modified Rankin (Stroke Patients Only)       Balance                                            Cognition Arousal/Alertness: Awake/alert Behavior During Therapy: WFL for tasks assessed/performed Overall Cognitive Status: Within Functional Limits for tasks assessed                                        Exercises General Exercises - Lower Extremity Ankle Circles/Pumps: AROM;Both;20  reps;Supine Quad Sets: Strengthening;Both;20 reps;Supine Gluteal Sets: Strengthening;Both;20 reps;Supine Short Arc Quad: AROM;Both;20 reps;Supine Heel Slides: AROM;Both;20 reps;Supine Hip ABduction/ADduction: AROM;Both;20 reps;Supine Straight Leg Raises: AROM;Strengthening;Both;20 reps;Supine    General Comments        Pertinent Vitals/Pain Pain Assessment: 0-10 Pain Score: 8  Pain Location: R knee more so than foot Pain Descriptors / Indicators: Constant Pain Intervention(s): Limited activity within patient's tolerance;Monitored during session    Home Living                      Prior Function            PT Goals (current goals can now be found in the care plan section)      Frequency    7X/week      PT Plan Current plan remains appropriate    Co-evaluation             End of Session   Activity Tolerance: Patient tolerated  treatment well;Other (comment) (decreased pain in R knee post exercises) Patient left: in bed;with call bell/phone within reach;with bed alarm set   PT Visit Diagnosis: Unsteadiness on feet (R26.81);Other abnormalities of gait and mobility (R26.89);Pain Pain - Right/Left: Right Pain - part of body: Ankle and joints of foot     Time: 7824-2353 PT Time Calculation (min) (ACUTE ONLY): 25 min  Charges:  $Therapeutic Exercise: 23-37 mins                    G Codes:        Larae Grooms, PTA 08/24/2016, 1:59 PM

## 2016-08-24 NOTE — Progress Notes (Signed)
Pharmacy Antibiotic Note  Paul [name]. is a 51 y.o. male admitted on 08/21/2016 with wound infection.  Pharmacy has been consulted for vancomycin and Zosyn dosing.  Plan: VT- 23.1 called in by lab  Will decrease vancomycin to 1250mg  Q18hr. Spoke with RN to stop vancomycin at this time. Will redraw vancomycin level prior to third dose to make sure patient is not accumulating.  Goal trough 15-20.  Zosyn 4.5 grams q 8 hours ordered for TBW 120 kg.  Height: 5\' 10"  (177.8 cm) Weight: 265 lb 3.2 oz (120.3 kg) IBW/kg (Calculated) : 73  Temp (24hrs), Avg:98.1 F (36.7 C), Min:97.3 F (36.3 C), Max:98.9 F (37.2 C)   Recent Labs Lab 08/21/16 2306 08/21/16 2318 08/22/16 0402 08/23/16 0336 08/24/16 0344 08/24/16 1737  WBC 22.3*  --  17.8* 15.8*  --   --   CREATININE 2.03*  --  1.96* 2.16* 2.19*  --   LATICACIDVEN  --  1.1  --   --   --   --   VANCOTROUGH  --   --   --   --   --  23*    Estimated Creatinine Clearance: 51.9 mL/min (A) (by C-G formula based on SCr of 2.19 mg/dL (H)).    Allergies  Allergen Reactions  . Ibuprofen Other (See Comments)    Heart problems    Antimicrobials this admission: vancomycin Zosyn 4/21 >>    >>   Dose adjustments this admission:   Microbiology results: 4/20 BCx: NGTD    Thank you for allowing pharmacy to be a part of this patient's care.  Loree Fee, PharmD 08/24/2016 6:31 PM

## 2016-08-24 NOTE — Progress Notes (Signed)
Patient in recovery post angiogram. Left groin without bleeding nor hematoma. Voiding without difficulty. Vitals remain stable at this time. Denies complaints. Report called to Care nurse on ortho with questions answered.

## 2016-08-25 ENCOUNTER — Inpatient Hospital Stay: Payer: Medicare Other

## 2016-08-25 ENCOUNTER — Encounter: Payer: Self-pay | Admitting: Vascular Surgery

## 2016-08-25 LAB — BASIC METABOLIC PANEL
Anion gap: 11 (ref 5–15)
BUN: 48 mg/dL — AB (ref 6–20)
CALCIUM: 8.7 mg/dL — AB (ref 8.9–10.3)
CHLORIDE: 100 mmol/L — AB (ref 101–111)
CO2: 27 mmol/L (ref 22–32)
CREATININE: 2.22 mg/dL — AB (ref 0.61–1.24)
GFR calc Af Amer: 38 mL/min — ABNORMAL LOW (ref >60)
GFR calc non Af Amer: 33 mL/min — ABNORMAL LOW (ref >60)
GLUCOSE: 248 mg/dL — AB (ref 65–99)
Potassium: 3.8 mmol/L (ref 3.5–5.1)
Sodium: 138 mmol/L (ref 135–145)

## 2016-08-25 LAB — GLUCOSE, CAPILLARY
GLUCOSE-CAPILLARY: 167 mg/dL — AB (ref 65–99)
GLUCOSE-CAPILLARY: 152 mg/dL — AB (ref 65–99)
Glucose-Capillary: 212 mg/dL — ABNORMAL HIGH (ref 65–99)
Glucose-Capillary: 174 mg/dL — ABNORMAL HIGH (ref 65–99)

## 2016-08-25 LAB — SURGICAL PATHOLOGY

## 2016-08-25 MED ORDER — RISAQUAD PO CAPS
1.0000 | ORAL_CAPSULE | Freq: Two times a day (BID) | ORAL | Status: DC
  Administered 2016-08-25 – 2016-08-27 (×5): 1 via ORAL
  Filled 2016-08-25 (×5): qty 1

## 2016-08-25 MED ORDER — SODIUM CHLORIDE 0.9% FLUSH: 10.0000 mL | INTRAVENOUS | Status: DC | PRN

## 2016-08-25 MED ORDER — INSULIN GLARGINE 100 UNIT/ML ~~LOC~~ SOLN
18.0000 [IU] | Freq: Every day | SUBCUTANEOUS | Status: DC
  Administered 2016-08-25 – 2016-08-26 (×2): 18 [IU] via SUBCUTANEOUS
  Filled 2016-08-25 (×3): qty 0.18

## 2016-08-25 MED ORDER — BENZONATATE 100 MG PO CAPS
100.0000 mg | ORAL_CAPSULE | Freq: Three times a day (TID) | ORAL | Status: DC
  Administered 2016-08-25 – 2016-08-27 (×7): 100 mg via ORAL
  Filled 2016-08-25 (×7): qty 1

## 2016-08-25 MED ORDER — SODIUM CHLORIDE 0.9% FLUSH
10.0000 mL | Freq: Two times a day (BID) | INTRAVENOUS | Status: DC
  Administered 2016-08-26 – 2016-08-27 (×3): 10 mL

## 2016-08-25 NOTE — Progress Notes (Signed)
Physical Therapy Treatment Patient Details Name: Paul [name]. MRN: [id number] DOB: [birth year] Today's Date: 08/25/2016    History of Present Illness Pt. is a 51 year old male admitted to [facility] on 08/21/16 after 4 days of increased R foot pain. 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     PT Comments    Pt agreeable to PT; reports less pain throughout Right lower extremity. Pleased with good report from MD regarding circulation in Right lower extremity. Pt does note he has been sitting edge of bed for some time for personal hygiene and feels R foot/ankle is visibly more swollen. Education regarding true elevation for swelling management. Post session pt placed closer to elevated position with Right lower extremity on pillows and trunk more declined. Pt cannot tolerate complete elevation due to back discomfort. Encouraged this position for 15 minutes at a time multiple times throughout the day with ankle pumps. Pt demonstrates good stability with stand transfers and stand activity/exercise as well as good compliance with R NWB status. Pt participates in stand seated and supine exercises. Continue PT to progress strength, endurance and balance to improve all functional mobility.   Follow Up Recommendations  Home health PT     Equipment Recommendations  Rolling walker with 5" wheels    Recommendations for Other Services       Precautions / Restrictions Precautions Precautions: Fall Restrictions Weight Bearing Restrictions: Yes RLE Weight Bearing: Non weight bearing    Mobility  Bed Mobility Overal bed mobility: Modified Independent             General bed mobility comments: use of trapeze for repositioning  Transfers Overall transfer level: Needs assistance Equipment used: Rolling walker (2 wheeled) Transfers: Sit to/from Stand Sit to Stand: Min guard         General transfer comment: good compliance with NWB  R  Ambulation/Gait Ambulation/Gait assistance: Min guard Ambulation Distance (Feet): 3 Feet Assistive device: Rolling walker (2 wheeled) Gait Pattern/deviations:  (several hops fwd/bkwd)   Gait velocity interpretation: <1.8 ft/sec, indicative of risk for recurrent falls General Gait Details: Several hops fwd/bkwd during stand activity/exercises   Stairs            Wheelchair Mobility    Modified Rankin (Stroke Patients Only)       Balance Overall balance assessment: Needs assistance Sitting-balance support: Feet supported Sitting balance-Leahy Scale: Normal     Standing balance support: Bilateral upper extremity supported Standing balance-Leahy Scale: Good Standing balance comment: able to performs stand exercises without seated rest, no LOB                             Cognition Arousal/Alertness: Awake/alert Behavior During Therapy: WFL for tasks assessed/performed Overall Cognitive Status: Within Functional Limits for tasks assessed                                        Exercises General Exercises - Lower Extremity Ankle Circles/Pumps: AROM;Both;20 reps;Supine Quad Sets: Strengthening;Both;20 reps;Supine Gluteal Sets: Strengthening;Both;20 reps;Supine Long Arc Quad: Strengthening;Both;20 reps;Seated Heel Slides: AROM;Left;20 reps;Supine Hip ABduction/ADduction: AROM;Left;20 reps;Supine;Other (comment);Strengthening (R standing 20x ) Straight Leg Raises: Strengthening;Both;20 reps;Supine;Other (comment) (R standing 20x ) Hip Flexion/Marching: Strengthening;Right;20 reps;Standing Other Exercises Other Exercises: R hip extension and ham curl standing 20x each  General Comments General comments (skin integrity, edema, etc.): swelling BLEs; subjectively more so this morn after sitting edge of bed for some time      Pertinent Vitals/Pain Pain Assessment: 0-10 Pain Score: 4  ("feels pretty good") Pain Location: R  foot/ankle Pain Intervention(s): Monitored during session (encouraged elevation for foot/ankle swelling)    Home Living                      Prior Function            PT Goals (current goals can now be found in the care plan section) Progress towards PT goals: Progressing toward goals    Frequency    7X/week      PT Plan Current plan remains appropriate    Co-evaluation             End of Session   Activity Tolerance: Patient tolerated treatment well;Other (comment) Patient left: in bed;with call bell/phone within reach;Other (comment) (refuses alarm; will call. RLE partial elevation)   PT Visit Diagnosis: Unsteadiness on feet (R26.81);Other abnormalities of gait and mobility (R26.89);Pain Pain - Right/Left: Right Pain - part of body: Ankle and joints of foot     Time: 8315-1761 PT Time Calculation (min) (ACUTE ONLY): 31 min  Charges:  $Gait Training: 8-22 mins $Therapeutic Exercise: 8-22 mins                    G Codes:       Larae Grooms, PTA 08/25/2016, 10:51 AM

## 2016-08-25 NOTE — Care Management (Signed)
Faxed vac form to [facility] [phone])

## 2016-08-25 NOTE — Plan of Care (Signed)
Problem: Food- and Nutrition-Related Knowledge Deficit (NB-1.1) Goal: Nutrition education Formal process to instruct or train a patient/client in a skill or to impart knowledge to help patients/clients voluntarily manage or modify food choices and eating behavior to maintain or improve health. Outcome: Adequate for Discharge  RD consulted for nutrition education regarding diabetes.   Lab Results  Component Value Date   HGBA1C 11.2 (H) 08/22/2016    RD provided "Carbohydrate Counting for People with Diabetes" handout from the [employer] of [employer]. Discussed different food groups and their effects on blood sugar, emphasizing carbohydrate-containing foods. Provided list of carbohydrates and recommended serving sizes of common foods.  Discussed importance of controlled and consistent carbohydrate intake throughout the day. Provided examples of ways to balance meals/snacks and encouraged intake of high-fiber, whole grain complex carbohydrates. Teach back method used.  RD provided "Heart Healthy Nutrition Therapy" handout from the [employer]. Reviewed patient's dietary recall. Provided examples on ways to decrease sodium and fat intake in diet. Discouraged intake of processed foods and use of salt shaker. Encouraged fresh fruits and vegetables as well as whole grain sources of carbohydrates to maximize fiber intake. Teach back method used.  Expect fair compliance.  Body mass index is 38.87 kg/m. Pt meets criteria for obese class II based on current BMI.  Current diet order is heart healthy, patient is consuming approximately 100% of meals at this time. Labs and medications reviewed. No further nutrition interventions warranted at this time. RD contact information provided. If additional nutrition issues arise, please re-consult RD.  Satira Anis. Lyfe Reihl, MS, RD LDN Inpatient Clinical Dietitian Pager [phone]

## 2016-08-25 NOTE — Consult Note (Signed)
[facility] Infectious Disease     Reason for Consult: Osteomyelitis    Referring Physician: Claria Dice Date of Admission:  08/21/2016   Principal Problem:   Gangrene of foot ([place]) Active Problems:   HTN (hypertension)   Diabetes ([place])   CAD (coronary artery disease)   Chronic systolic CHF (congestive heart failure) (HCC)   Diabetic foot infection ([place])   HPI: Paul Fester. is a 51 y.o. male admitted with progressive infection of R foot. He has hx of poorly controlled DM (A1c 11.2), CADm CGF, PAD, CKD. On admit wbc 17 , initially AF but spiked to 101. He had I and D done 4/21 with gangrene of R 3 4 toes and and dorsal abscess. Had amputation of R 3rd toe and 4th toe and excision of 3rd and 4th MT head. Had revascularization with  angiogram done 4/23. Gram stain from abscess is mixed, cx are mixed with proteus, and anaerobes.  MRSA PCR negative.   Past Medical History:  Diagnosis Date  . AICD (automatic cardioverter/defibrillator) present   . Cardiac defibrillator in place   . CHF (congestive heart failure) (HCC)    takes Torsemide daily  . CHF (congestive heart failure) ([place])   . Coronary artery disease   . DDD (degenerative disc disease), lumbosacral    L5-S1  . Diabetes ([place])    Lantus at bedtime  . Hypertension    takes Coreg daily  . Sleep apnea    sleep study yr ago-unable to afford cpap  . Stroke [facility]) 09   no weakness   Past Surgical History:  Procedure Laterality Date  . AMPUTATION TOE Right 08/22/2016   Procedure: AMPUTATION TOE;  Surgeon: Samara Deist, DPM;  Location: [facility] ORS;  Service: Podiatry;  Laterality: Right;  . CARDIAC DEFIBRILLATOR PLACEMENT  2011  . CHOLECYSTECTOMY  2010  . CORONARY ARTERY BYPASS GRAFT  2010   CABG x 3   . HIP SURGERY Right 1994  . INCISION AND DRAINAGE OF WOUND Right 08/22/2016   Procedure: IRRIGATION AND DEBRIDEMENT WOUND and wound vac placement;  Surgeon: Samara Deist, DPM;  Location: [facility] ORS;  Service: Podiatry;   Laterality: Right;  . LOWER EXTREMITY ANGIOGRAPHY Right 08/24/2016   Procedure: Lower Extremity Angiography;  Surgeon: Algernon Huxley, MD;  Location: [place] CV LAB;  Service: Cardiovascular;  Laterality: Right;  . MASS EXCISION Right 07/18/2014   Procedure: EXCISION HETEROTOPIC BONE RIGHT HIP;  Surgeon: Frederik Pear, MD;  Location: [place];  Service: Orthopedics;  Laterality: Right;  . Open Heart Surgery  2010   x 3   Social History  Substance Use Topics  . Smoking status: Never Smoker  . Smokeless tobacco: Never Used  . Alcohol use No   Family History  Problem Relation Age of Onset  . Hypertension Other   . Diabetes Other   . Diabetes Father   . Hyperlipidemia Father   . Hypertension Father   . Hyperlipidemia Sister   . Hypertension Sister   . Diabetes Sister   . Diabetes Brother   . Hypertension Brother   . Hyperlipidemia Brother     Allergies:  Allergies  Allergen Reactions  . Ibuprofen Other (See Comments)    Heart problems    Current antibiotics: Antibiotics Given (last 72 hours)    None      MEDICATIONS: . acidophilus  1 capsule Oral BID  . aspirin  81 mg Oral Daily  . benzonatate  100 mg Oral TID  . carvedilol  12.5 mg Oral BID WC  . colchicine  0.6 mg Oral Daily  . heparin  5,000 Units Subcutaneous Q8H  . indomethacin  50 mg Oral TID WC  . insulin aspart  0-5 Units Subcutaneous QHS  . insulin aspart  0-9 Units Subcutaneous TID WC  . insulin aspart  3 Units Subcutaneous TID WC  . insulin glargine  18 Units Subcutaneous QHS  . spironolactone  25 mg Oral Daily  . torsemide  40 mg Oral BID    Review of Systems - 11 systems reviewed and negative per HPI   OBJECTIVE: Temp:  [97.4 F (36.3 C)-98 F (36.7 C)] 97.4 F (36.3 C) (04/24 0730) Pulse Rate:  [73-84] 84 (04/24 1006) Resp:  [16-18] 18 (04/24 0504) BP: (124-137)/(69-84) 124/72 (04/24 0730) SpO2:  [89 %-100 %] 97 % (04/24 1006) Weight:  [122.9 kg (270 lb 14.4 oz)] 122.9 kg (270 lb 14.4 oz)  (04/24 0431) Physical Exam  Constitutional: He is oriented to person, place, and time. He appears well-developed and well-nourished. No distress. obese HENT: anicteric Mouth/Throat: Oropharynx is clear and moist. No oropharyngeal exudate.  Cardiovascular: Normal rate, regular rhythm and normal heart sounds. Exam reveals no gallop and no friction rub.  No murmur heard.  Pulmonary/Chest: Effort normal and breath sounds normal. No respiratory distress. He has no wheezes.  Abdominal: Soft. Bowel sounds are normal. He exhibits no distension. There is no tenderness.  Lymphadenopathy: He has no cervical adenopathy.  Neurological: He is alert and oriented to person, place, and time. Decreased sensation bil LE Ext - 1+ edema RLE Skin: R foot with wound vac over incision site from dorsum to plantar surface of 3-4 amputation site. Foot is warm Psychiatric: He has a normal mood and affect. His behavior is normal.     LABS: Results for orders placed or performed during the hospital encounter of 08/21/16 (from the past 48 hour(s))  Glucose, capillary     Status: Abnormal   Collection Time: 08/23/16  4:30 PM  Result Value Ref Range   Glucose-Capillary 218 (H) 65 - 99 mg/dL   Comment 1 Notify RN   Glucose, capillary     Status: Abnormal   Collection Time: 08/23/16  9:47 PM  Result Value Ref Range   Glucose-Capillary 269 (H) 65 - 99 mg/dL   Comment 1 Notify RN   Basic metabolic panel     Status: Abnormal   Collection Time: 08/24/16  3:44 AM  Result Value Ref Range   Sodium 133 (L) 135 - 145 mmol/L   Potassium 4.1 3.5 - 5.1 mmol/L   Chloride 98 (L) 101 - 111 mmol/L   CO2 23 22 - 32 mmol/L   Glucose, Bld 300 (H) 65 - 99 mg/dL   BUN 44 (H) 6 - 20 mg/dL   Creatinine, Ser 2.19 (H) 0.61 - 1.24 mg/dL   Calcium 8.3 (L) 8.9 - 10.3 mg/dL   GFR calc non Af Amer 33 (L) >60 mL/min   GFR calc Af Amer 38 (L) >60 mL/min    Comment: (NOTE) The eGFR has been calculated using the CKD EPI equation. This  calculation has not been validated in all clinical situations. eGFR's persistently <60 mL/min signify possible Chronic Kidney Disease.    Anion gap 12 5 - 15  Glucose, capillary     Status: Abnormal   Collection Time: 08/24/16  7:35 AM  Result Value Ref Range   Glucose-Capillary 302 (H) 65 - 99 mg/dL  Glucose, capillary  Status: Abnormal   Collection Time: 08/24/16 11:41 AM  Result Value Ref Range   Glucose-Capillary 260 (H) 65 - 99 mg/dL  Glucose, capillary     Status: Abnormal   Collection Time: 08/24/16  5:36 PM  Result Value Ref Range   Glucose-Capillary 171 (H) 65 - 99 mg/dL  Vancomycin, trough     Status: Abnormal   Collection Time: 08/24/16  5:37 PM  Result Value Ref Range   Vancomycin Tr 23 (HH) 15 - 20 ug/mL    Comment: CRITICAL RESULT CALLED TO, READ BACK BY AND VERIFIED WITH KELLY FUHRMANN @ 1809 08/24/16 BY [facility]   Magnesium     Status: Abnormal   Collection Time: 08/24/16  8:21 PM  Result Value Ref Range   Magnesium 1.4 (L) 1.7 - 2.4 mg/dL  Glucose, capillary     Status: Abnormal   Collection Time: 08/24/16  9:06 PM  Result Value Ref Range   Glucose-Capillary 194 (H) 65 - 99 mg/dL  Basic metabolic panel     Status: Abnormal   Collection Time: 08/25/16  4:31 AM  Result Value Ref Range   Sodium 138 135 - 145 mmol/L   Potassium 3.8 3.5 - 5.1 mmol/L   Chloride 100 (L) 101 - 111 mmol/L   CO2 27 22 - 32 mmol/L   Glucose, Bld 248 (H) 65 - 99 mg/dL   BUN 48 (H) 6 - 20 mg/dL   Creatinine, Ser 2.22 (H) 0.61 - 1.24 mg/dL   Calcium 8.7 (L) 8.9 - 10.3 mg/dL   GFR calc non Af Amer 33 (L) >60 mL/min   GFR calc Af Amer 38 (L) >60 mL/min    Comment: (NOTE) The eGFR has been calculated using the CKD EPI equation. This calculation has not been validated in all clinical situations. eGFR's persistently <60 mL/min signify possible Chronic Kidney Disease.    Anion gap 11 5 - 15  Glucose, capillary     Status: Abnormal   Collection Time: 08/25/16  7:25 AM  Result Value Ref  Range   Glucose-Capillary 212 (H) 65 - 99 mg/dL  Glucose, capillary     Status: Abnormal   Collection Time: 08/25/16 11:42 AM  Result Value Ref Range   Glucose-Capillary 174 (H) 65 - 99 mg/dL   Comment 1 Notify RN    No components found for: ESR, C REACTIVE PROTEIN MICRO: Recent Results (from the past 720 hour(s))  Culture, blood (routine x 2)     Status: None (Preliminary result)   Collection Time: 08/21/16 11:05 PM  Result Value Ref Range Status   Specimen Description BLOOD RIGHT WRIST  Final   Special Requests   Final    BOTTLES DRAWN AEROBIC AND ANAEROBIC Blood Culture adequate volume   Culture NO GROWTH 4 DAYS  Final   Report Status PENDING  Incomplete  Culture, blood (routine x 2)     Status: None (Preliminary result)   Collection Time: 08/22/16 12:13 AM  Result Value Ref Range Status   Specimen Description BLOOD RIGHT HAND  Final   Special Requests   Final    BOTTLES DRAWN AEROBIC AND ANAEROBIC Blood Culture adequate volume   Culture NO GROWTH 3 DAYS  Final   Report Status PENDING  Incomplete  Aerobic/Anaerobic Culture (surgical/deep wound)     Status: Abnormal (Preliminary result)   Collection Time: 08/22/16  9:15 AM  Result Value Ref Range Status   Specimen Description WOUND Toe R  Final   Special Requests Immunocompromised  Final  Gram Stain   Final    FEW WBC PRESENT, PREDOMINANTLY PMN RARE SQUAMOUS EPITHELIAL CELLS PRESENT ABUNDANT GRAM POSITIVE COCCI IN PAIRS ABUNDANT GRAM NEGATIVE RODS Performed at [facility], [place] [address], [place], [place] [address]    Culture (A)  Final    MULTIPLE ORGANISMS PRESENT, NONE PREDOMINANT AEROBICALLY NO ANAEROBES ISOLATED; CULTURE IN PROGRESS FOR 5 DAYS    Report Status PENDING  Incomplete  Surgical pcr screen     Status: None   Collection Time: 08/22/16  9:16 AM  Result Value Ref Range Status   MRSA, PCR NEGATIVE NEGATIVE Final   Staphylococcus aureus NEGATIVE NEGATIVE Final    Comment:        The Xpert SA  Assay (FDA approved for NASAL specimens in patients over 56 years of age), is one component of a comprehensive surveillance program.  Test performance has been validated by [facility] for patients greater than or equal to 104 year old. It is not intended to diagnose infection nor to guide or monitor treatment.   Aerobic/Anaerobic Culture (surgical/deep wound)     Status: None (Preliminary result)   Collection Time: 08/22/16  4:26 PM  Result Value Ref Range Status   Specimen Description ABSCESS RIGHT FOOT  Final   Special Requests PATIENT ON FOLLOWING ZOSYN VANCOMYCIN  Final   Gram Stain   Final    RARE WBC PRESENT, PREDOMINANTLY PMN ABUNDANT GRAM POSITIVE COCCI IN PAIRS ABUNDANT GRAM NEGATIVE COCCOBACILLI ABUNDANT GRAM POSITIVE RODS    Culture   Final    MODERATE PROTEUS PENNERI HOLDING FOR POSSIBLE ANAEROBE Performed at [facility], [place] [address] [place], [place] [address]    Report Status PENDING  Incomplete   Organism ID, Bacteria PROTEUS PENNERI  Final      Susceptibility   Proteus penneri - MIC*    AMPICILLIN >=32 RESISTANT Resistant     CEFAZOLIN >=64 RESISTANT Resistant     CEFEPIME <=1 SENSITIVE Sensitive     CEFTAZIDIME <=1 SENSITIVE Sensitive     CEFTRIAXONE <=1 SENSITIVE Sensitive     CIPROFLOXACIN <=0.25 SENSITIVE Sensitive     GENTAMICIN <=1 SENSITIVE Sensitive     IMIPENEM 2 SENSITIVE Sensitive     TRIMETH/SULFA <=20 SENSITIVE Sensitive     AMPICILLIN/SULBACTAM 16 INTERMEDIATE Intermediate     PIP/TAZO <=4 SENSITIVE Sensitive     * MODERATE PROTEUS PENNERI    IMAGING: Dg Chest Port 1 View  Result Date: 08/25/2016 CLINICAL DATA:  Cough.  Toe amputation yesterday. EXAM: PORTABLE CHEST 1 VIEW COMPARISON:  11/11/2015 FINDINGS: Pacemaker/AICD appears the same. Previous median sternotomy and CABG. Chronic cardiomegaly and venous hypertension. No infiltrate, collapse or effusion. IMPRESSION: Cardiomegaly and venous hypertension. No edema, collapse or  effusion. Electronically Signed   By: Nelson Chimes M.D.   On: 08/25/2016 09:55   Dg Foot Complete Right  Result Date: 08/21/2016 CLINICAL DATA:  Weeping sores to the bottom of the right foot and third and fourth digits beginning 3 days ago. EXAM: RIGHT FOOT COMPLETE - 3+ VIEW COMPARISON:  03/29/2014 FINDINGS: Right foot appears intact No evidence of acute fracture or subluxation. No focal bone lesion or bone destruction. Bone cortex and trabecular architecture appear intact. No radiopaque soft tissue foreign bodies. No soft tissue gas collections. Mild dorsal soft tissue swelling. Prominent vascular calcifications. IMPRESSION: Dorsal soft tissue swelling. No radiopaque foreign bodies or gas collections. No acute bony abnormalities. Electronically Signed   By: Lucienne Capers M.D.   On: 08/21/2016  22:14    Assessment:   Paul Gusler. is a 51 y.o. male admitted with progressive infection of R foot. He has hx of poorly controlled DM (A1c 11.2), CADm CGF, PAD, CKD. On admit wbc 17 , initially AF but spiked to 101. He had I and D done 4/21 with gangrene of R 3 4 toes and and dorsal abscess. Had amputation of R 3rd toe and 4th toe and excision of 3rd and 4th MT head. Had revascularization with  angiogram done 4/23. Gram stain from abscess is mixed, cx are mixed with proteus, and anaerobes.  Recommendations He would benefit from IV abx and will place PICC line. No MRSA isolated and MRSA PCR neg so will stop vanco. Cont zosyn - will plan on 2-4 weeks of IV zosyn unless cxs reveal resistant organisms. See abx order sheet. Ordered picc- discussed with patient and his mother Thank you very much for allowing me to participate in the care of this patient. Please call with questions.   Cheral Marker. Ola Spurr, MD

## 2016-08-25 NOTE — Progress Notes (Signed)
[facility] [place] NAME: Paul [name]    MR#:  [id number]  DATE OF BIRTH:  1965-11-27  SUBJECTIVE:  CHIEF COMPLAINT:   Chief Complaint  Patient presents with  . Foot Pain   - Had angiogram and angioplasty done for right lower extremity. Establishment of good flow. Gout symptoms are improving today. -Status post right toe surgery and wound VAC in place.  REVIEW OF SYSTEMS:  Review of Systems  Constitutional: Negative for chills, fever and malaise/fatigue.  HENT: Negative for ear discharge, ear pain, hearing loss and nosebleeds.   Eyes: Negative for blurred vision and double vision.  Respiratory: Negative for cough, shortness of breath and wheezing.   Cardiovascular: Positive for leg swelling. Negative for chest pain and palpitations.  Gastrointestinal: Negative for abdominal pain, constipation, diarrhea, nausea and vomiting.  Genitourinary: Negative for dysuria.  Musculoskeletal: Positive for joint pain and myalgias.  Neurological: Negative for dizziness, speech change, focal weakness, seizures and headaches.  Psychiatric/Behavioral: Negative for depression.    DRUG ALLERGIES:   Allergies  Allergen Reactions  . Ibuprofen Other (See Comments)    Heart problems    VITALS:  Blood pressure 124/72, pulse 84, temperature 97.4 F (36.3 C), temperature source Oral, resp. rate 18, height 5\' 10"  (1.778 m), weight 122.9 kg (270 lb 14.4 oz), SpO2 97 %.  PHYSICAL EXAMINATION:  Physical Exam  GENERAL:  51 y.o.-year-old patient lying in the bed with no acute distress.  EYES: Pupils equal, round, reactive to light and accommodation. No scleral icterus. Extraocular muscles intact.  HEENT: Head atraumatic, normocephalic. Oropharynx and nasopharynx clear.  NECK:  Supple, no jugular venous distention. No thyroid enlargement, no tenderness.  LUNGS: Normal breath sounds bilaterally, no wheezing, rales,rhonchi or crepitation. No use of accessory  muscles of respiration.  CARDIOVASCULAR: S1, S2 normal. No murmurs, rubs, or gallops. AICD in place ABDOMEN: Soft, nontender, nondistended. Bowel sounds present. No organomegaly or mass.  EXTREMITIES: No  cyanosis, or clubbing. 2+ Lower extremity edema. 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 Wound VAC in place.  NEUROLOGIC: Cranial nerves II through XII are intact. Muscle strength 5/5 in all extremities. Sensation intact. Gait not checked.  PSYCHIATRIC: The patient is alert and oriented x 3.  SKIN: No obvious rash, lesion, or ulcer.    LABORATORY PANEL:   CBC  Recent Labs Lab 08/23/16 0336  WBC 15.8*  HGB 9.6*  HCT 29.8*  PLT 237   ------------------------------------------------------------------------------------------------------------------  Chemistries   Recent Labs Lab 08/21/16 2306  08/24/16 2021 08/25/16 0431  NA 133*  < >  --  138  K 3.8  < >  --  3.8  CL 102  < >  --  100*  CO2 22  < >  --  27  GLUCOSE 262*  < >  --  248*  BUN 45*  < >  --  48*  CREATININE 2.03*  < >  --  2.22*  CALCIUM 8.4*  < >  --  8.7*  MG  --   --  1.4*  --   AST 35  --   --   --   ALT 21  --   --   --   ALKPHOS 133*  --   --   --   BILITOT 0.8  --   --   --   < > = values in this interval not displayed. ------------------------------------------------------------------------------------------------------------------  Cardiac Enzymes No results for  input(s): TROPONINI in the last 168 hours. ------------------------------------------------------------------------------------------------------------------  RADIOLOGY:  Dg Chest Port 1 View  Result Date: 08/25/2016 CLINICAL DATA:  Cough.  Toe amputation yesterday. EXAM: PORTABLE CHEST 1 VIEW COMPARISON:  11/11/2015 FINDINGS: Pacemaker/AICD appears the same. Previous median sternotomy and CABG. Chronic cardiomegaly and venous hypertension. No infiltrate, collapse or effusion. IMPRESSION: Cardiomegaly and venous  hypertension. No edema, collapse or effusion. Electronically Signed   By: Nelson Chimes M.D.   On: 08/25/2016 09:55    EKG:   Orders placed or performed during the hospital encounter of 08/21/16  . ED EKG  . ED EKG  . EKG 12-Lead  . EKG 12-Lead    ASSESSMENT AND PLAN:   51 y/o M with PMH significant for CAD status post CABG, ischemic cardiomyopathy s/p AICD, hypertension, IDDM, CKD admitted to hospital secondary to right foot ulcer with purulent discharge.  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 -Intraoperative cultures are pending - continue broad spectrum ABX with vancomycin and zosyn - Infectious disease consult requested. -Had poor blood flow during the surgery, so vascular has been consulted. Status post angiogram and angioplasty to the right leg yesterday with successful revascularization. Patient will be discharged on the bone back. -Right foot nonweightbearing at this time. Physical therapy consulted. -Diarrhea yesterday with the antibiotics. Probiotics added. If further diarrhea, check for C. difficile.  #2 CAD s/p CABG- stable, no active cardiac symptoms - cardiac clearance appreciated. No active symptoms  #3 Systolic CHF- cardiomyopathy ischemia- stable, well compensated. Continue torsemide orally Cardiac clearance appreciated s/p AICD On asa, coreg and aldactone. Reaction to lisinopril in the past. Outpatient plans to start ARB if renal function improves -Cough noted, chest x-ray and cough meds added.  #4 Gout- known h/o gout- acute gouty attack now. On colchicine and Indomethacin. Discontinue prednisone as patient does not want to take it.  #5 CKD stage III-Baseline creatinine seems to be around 1.8. Slight worsening noted at this time. Gentle hydration and avoid nephrotoxins as patient had angiogram yesterday. Stop fluids today  #6 DVT  Prophylaxis- SQ heparin  #7 diabetes mellitus-on Lantus and pre-meal insulin. Also on sliding scale insulin   All the records are reviewed and case discussed with Care Management/Social Workerr. Management plans discussed with the patient, family and they are in agreement.  CODE STATUS: Full Code   TOTAL TIME TAKING CARE OF THIS PATIENT: 37 minutes.   POSSIBLE D/C IN 1-2 DAYS, DEPENDING ON CLINICAL CONDITION.   Gladstone Lighter M.D on 08/25/2016 at 11:12 AM  Between 7am to 6pm - Pager - [phone]  After 6pm go to [email or web address] - password [email or web address]  Office  [phone]  CC: Primary care physician; Kandice Hams, MD

## 2016-08-25 NOTE — Progress Notes (Signed)
Daily Progress Note   Subjective  - 1 Day Post-Op  f/u right 3 & 4 toe amputation.  Revascularization yesterday.  No complaints today  Objective Vitals:   08/24/16 2047 08/25/16 0431 08/25/16 0504 08/25/16 0730  BP: 126/69  137/84 124/72  Pulse: 79  76 76  Resp: 18  18   Temp: 98 F (36.7 C)   97.4 F (36.3 C)  TempSrc: Oral  Oral Oral  SpO2: 96%  96% 96%  Weight:  122.9 kg (270 lb 14.4 oz)    Height:        Physical Exam: Wound vac changed.  No purulence.  Healthy granular tissue in wound defect.  Defect measure approx 2.5cm diameter with 2cm depth to bone.  Mild maceration surrounding wound.  Laboratory CBC    Component Value Date/Time   WBC 15.8 (H) 08/23/2016 0336   HGB 9.6 (L) 08/23/2016 0336   HCT 29.8 (L) 08/23/2016 0336   PLT 237 08/23/2016 0336    BMET    Component Value Date/Time   NA 138 08/25/2016 0431   NA 139 06/23/2016 1553   K 3.8 08/25/2016 0431   CL 100 (L) 08/25/2016 0431   CO2 27 08/25/2016 0431   GLUCOSE 248 (H) 08/25/2016 0431   BUN 48 (H) 08/25/2016 0431   BUN 46 (H) 06/23/2016 1553   CREATININE 2.22 (H) 08/25/2016 0431   CALCIUM 8.7 (L) 08/25/2016 0431   GFRNONAA 33 (L) 08/25/2016 0431   GFRAA 38 (L) 08/25/2016 0431   Results for orders placed or performed during the hospital encounter of 08/21/16  Culture, blood (routine x 2)     Status: None (Preliminary result)   Collection Time: 08/21/16 11:05 PM  Result Value Ref Range Status   Specimen Description BLOOD RIGHT WRIST  Final   Special Requests   Final    BOTTLES DRAWN AEROBIC AND ANAEROBIC Blood Culture adequate volume   Culture NO GROWTH 3 DAYS  Final   Report Status PENDING  Incomplete  Culture, blood (routine x 2)     Status: None (Preliminary result)   Collection Time: 08/22/16 12:13 AM  Result Value Ref Range Status   Specimen Description BLOOD RIGHT HAND  Final   Special Requests   Final    BOTTLES DRAWN AEROBIC AND ANAEROBIC Blood Culture adequate volume   Culture NO  GROWTH 2 DAYS  Final   Report Status PENDING  Incomplete  Aerobic/Anaerobic Culture (surgical/deep wound)     Status: Abnormal (Preliminary result)   Collection Time: 08/22/16  9:15 AM  Result Value Ref Range Status   Specimen Description WOUND Toe R  Final   Special Requests Immunocompromised  Final   Gram Stain   Final    FEW WBC PRESENT, PREDOMINANTLY PMN RARE SQUAMOUS EPITHELIAL CELLS PRESENT ABUNDANT GRAM POSITIVE COCCI IN PAIRS ABUNDANT GRAM NEGATIVE RODS Performed at [facility], [place] [address], [place], [place] [address]    Culture (A)  Final    MULTIPLE ORGANISMS PRESENT, NONE PREDOMINANT AEROBICALLY NO ANAEROBES ISOLATED; CULTURE IN PROGRESS FOR 5 DAYS    Report Status PENDING  Incomplete  Surgical pcr screen     Status: None   Collection Time: 08/22/16  9:16 AM  Result Value Ref Range Status   MRSA, PCR NEGATIVE NEGATIVE Final   Staphylococcus aureus NEGATIVE NEGATIVE Final    Comment:        The Xpert SA Assay (FDA approved for NASAL specimens in patients over 67 years of age), is  one component of a comprehensive surveillance program.  Test performance has been validated by [facility] for patients greater than or equal to 70 year old. It is not intended to diagnose infection nor to guide or monitor treatment.   Aerobic/Anaerobic Culture (surgical/deep wound)     Status: None (Preliminary result)   Collection Time: 08/22/16  4:26 PM  Result Value Ref Range Status   Specimen Description ABSCESS RIGHT FOOT  Final   Special Requests PATIENT ON FOLLOWING ZOSYN VANCOMYCIN  Final   Gram Stain   Final    RARE WBC PRESENT, PREDOMINANTLY PMN ABUNDANT GRAM POSITIVE COCCI IN PAIRS ABUNDANT GRAM NEGATIVE COCCOBACILLI ABUNDANT GRAM POSITIVE RODS Performed at [facility], [place] [address], [place], [place] [address]    Culture MODERATE GRAM NEGATIVE RODS  Final   Report Status PENDING  Incomplete    Assessment/Planning: s/p debridment  right   Awaiting final culture from OR.  Mixed culture from superifcial swab  Wound vac changed.  Will continue outpt  Suspect can allow granulation and secondary closure for now.  Pt understand will take weeks to months to close.  Plan for grafting or other procedures as needed depending on wound healing.  Pt also still at risk of TMA if wound worsens.  NWB to right foot.  ID to follow culture and antibiotics.  Suspect can d/c in 1-2 once medically stable.  To f/u with me in outpt clinic in 2 weeks.  If still in house Thursday will change dressing.  Samara Deist A  08/25/2016, 8:07 AM

## 2016-08-25 NOTE — Progress Notes (Signed)
Peripherally Inserted Central Catheter/Midline Placement  The IV Nurse has discussed with the patient and/or persons authorized to consent for the patient, the purpose of this procedure and the potential benefits and risks involved with this procedure.  The benefits include less needle sticks, lab draws from the catheter, and the patient may be discharged home with the catheter. Risks include, but not limited to, infection, bleeding, blood clot (thrombus formation), and puncture of an artery; nerve damage and irregular heartbeat and possibility to perform a PICC exchange if needed/ordered by physician.  Alternatives to this procedure were also discussed.  Bard Power PICC patient education guide, fact sheet on infection prevention and patient information card has been provided to patient /or left at bedside.    PICC/Midline Placement Documentation  PICC Single Lumen 96/78/93 PICC Right Basilic 41 cm 1 cm (Active)  Indication for Insertion or Continuance of Line Home intravenous therapies (PICC only) 08/25/2016  5:00 PM  Exposed Catheter (cm) 1 cm 08/25/2016  5:00 PM  Dressing Change Due 09/01/16 08/25/2016  5:00 PM       Jule Economy Horton 08/25/2016, 5:36 PM

## 2016-08-25 NOTE — Care Management (Signed)
Per Corene Cornea with [facility] patient received a new walker 07/19/2014. Can get ONE every 5 years.

## 2016-08-25 NOTE — Progress Notes (Signed)
Pt remaining alert and oriented this shift. Incision to left groin dry and intact. Iv infusing without difficulty. Surgical dressing to foot dry and intact. Minimal drainage in wound vac.  Pt having runs of v-tach during the night MD on call notified. Able to sleep in between care.

## 2016-08-25 NOTE — Progress Notes (Signed)
Pt with another 11 beat run of v-tach, Dr. Marcille Blanco notified no new orders at this time.

## 2016-08-25 NOTE — Care Management Note (Signed)
Case Management Note  Patient Details  Name: Paul [name]. MRN: [id number] Date of Birth: 1965/10/31  Subjective/Objective:  Spoke with Ricki at [facility] to initiate wound vac orders and discharge plan per podiatry. Following progression.                   Action/Plan:   Expected Discharge Date:  08/23/16               Expected Discharge Plan:  [place]  In-House Referral:     Discharge planning Services  CM Consult  Post Acute Care Choice:    Choice offered to:     DME Arranged:    DME Agency:     HH Arranged:    HH Agency:     Status of Service:  In process, will continue to follow  If discussed at Long Length of Stay Meetings, dates discussed:    Additional Comments:  Jolly Mango, RN 08/25/2016, 9:35 AM

## 2016-08-25 NOTE — Progress Notes (Signed)
Inpatient Diabetes Program Recommendations  AACE/ADA: New Consensus Statement on Inpatient Glycemic Control (2015)  Target Ranges:  Prepandial:   less than 140 mg/dL      Peak postprandial:   less than 180 mg/dL (1-2 hours)      Critically ill patients:  140 - 180 mg/dL   Results for TRAESON, DUSZA (MRN [id number]) as of 08/25/2016 07:55  Ref. Range 08/24/2016 07:35 08/24/2016 11:41 08/24/2016 17:36 08/24/2016 21:06 08/25/2016 07:25  Glucose-Capillary Latest Ref Range: 65 - 99 mg/dL 302 (H) 260 (H) 171 (H) 194 (H) 212 (H)   Review of Glycemic Control  Diabetes history: DM2 Outpatient Diabetes medications: Lantus 25 units QHS Current orders for Inpatient glycemic control: Lantus 10 units QHS, Novolog 3 units TID with meals, Novolog 0-9 units TID with meals, Novolog 0-5 units QHS  Inpatient Diabetes Program Recommendations:  Insulin - Basal: Please consider increasing Lantus to 15 units QHS. HgbA1C: A1C 11.2% on 08/22/16 indicating an average glucose of 275 mg/dl over the past 2-3 months. At time of discharge, recommend discharging patient on Lantus and Novolog (similar regimen as currently being used as an inpatient) and have patient follow up with PCP regarding DM control.  Thanks, Barnie Alderman, RN, MSN, CDE Diabetes Coordinator Inpatient Diabetes Program [phone] (Team Pager from 8am to 5pm)

## 2016-08-25 NOTE — Progress Notes (Signed)
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   Culture results Aerobic/Anaerobic Culture (surgical/deep wound) [[id number]]  Collected: 08/22/16 1626  Order Status: Completed Specimen: Tissue from [facility] Other Updated: 08/25/16 1229   Specimen Description ABSCESS RIGHT FOOT   Special Requests PATIENT ON FOLLOWING ZOSYN VANCOMYCIN   Gram Stain --   RARE WBC PRESENT, PREDOMINANTLY PMN  ABUNDANT GRAM POSITIVE COCCI IN PAIRS  ABUNDANT GRAM NEGATIVE COCCOBACILLI  ABUNDANT GRAM POSITIVE RODS    Culture --   MODERATE PROTEUS PENNERI  HOLDING FOR POSSIBLE ANAEROBE  Performed at [facility], [address], [place], [place] [address]    Report Status PENDING   Organism ID, Bacteria PROTEUS PENNERI   Allergies:  Allergies  Allergen Reactions  . Ibuprofen Other (See Comments)    Heart problems   Discharge antibiotics Zosyn  4.5  grams every 8 hours  PICC Care per protocol Labs weekly while on IV antibiotics     FAX weekly labs to [phone] CBC w diff   Comprehensive met panel Vancomycin Trough   CRP Esr  Planned duration of antibiotics 4 weeks Stop date May 19   Follow up clinic date1-2 weeks   Leonel Ramsay, MD

## 2016-08-25 NOTE — Care Management Note (Signed)
Case Management Note  Patient Details  Name: Paul [name]. MRN: [id number] Date of Birth: 05/06/1965  Subjective/Objective:  Met with patient and his wife at bedside. Discussed discharge planning. Patient is open to home health with no agency preference. [facility]erral to [facility] for nursing, PT and a rolling walker. It is anticipated patient will be discharged home in 1-2 days. He lives with his wife. PCP is Dr. Delfina Redwood                   Action/Plan:   Expected Discharge Date:  08/23/16               Expected Discharge Plan:  [place]  In-House Referral:     Discharge planning Services  CM Consult  Post Acute Care Choice:  Durable Medical Equipment, Home Health Choice offered to:  Patient  DME Arranged:  Walker rolling DME Agency:  [place] Arranged:  RN, PT [facility] Agency:  [place]  Status of Service:  In process, will continue to follow  If discussed at Long Length of Stay Meetings, dates discussed:    Additional Comments:  Jolly Mango, RN 08/25/2016, 12:02 PM

## 2016-08-26 LAB — CULTURE, BLOOD (ROUTINE X 2)
Culture: NO GROWTH
Special Requests: ADEQUATE

## 2016-08-26 LAB — BASIC METABOLIC PANEL
ANION GAP: 10 (ref 5–15)
BUN: 54 mg/dL — ABNORMAL HIGH (ref 6–20)
CO2: 27 mmol/L (ref 22–32)
Calcium: 8.6 mg/dL — ABNORMAL LOW (ref 8.9–10.3)
Chloride: 101 mmol/L (ref 101–111)
Creatinine, Ser: 2.86 mg/dL — ABNORMAL HIGH (ref 0.61–1.24)
GFR, EST AFRICAN AMERICAN: 28 mL/min — AB (ref >60)
GFR, EST NON AFRICAN AMERICAN: 24 mL/min — AB (ref >60)
Glucose, Bld: 142 mg/dL — ABNORMAL HIGH (ref 65–99)
POTASSIUM: 3.6 mmol/L (ref 3.5–5.1)
SODIUM: 138 mmol/L (ref 135–145)

## 2016-08-26 LAB — C-REACTIVE PROTEIN: CRP: 8.4 mg/dL — AB (ref <1.0)

## 2016-08-26 LAB — SEDIMENTATION RATE: SED RATE: 79 mm/h — AB (ref 0–20)

## 2016-08-26 LAB — GLUCOSE, CAPILLARY
GLUCOSE-CAPILLARY: 117 mg/dL — AB (ref 65–99)
GLUCOSE-CAPILLARY: 124 mg/dL — AB (ref 65–99)
GLUCOSE-CAPILLARY: 109 mg/dL — AB (ref 65–99)
Glucose-Capillary: 127 mg/dL — ABNORMAL HIGH (ref 65–99)

## 2016-08-26 MED ORDER — COLCHICINE 0.6 MG PO TABS
0.6000 mg | ORAL_TABLET | Freq: Two times a day (BID) | ORAL | Status: DC
  Administered 2016-08-26 – 2016-08-27 (×2): 0.6 mg via ORAL
  Filled 2016-08-26 (×2): qty 1

## 2016-08-26 NOTE — Care Management Note (Signed)
Case Management Note  Patient Details  Name: Nethaniel Mattie. MRN: [id number] Date of Birth: [birth year]  Subjective/Objective:  Corene Cornea with [facility] notified of need for long term IV abx.                  Action/Plan:   Expected Discharge Date:  08/23/16               Expected Discharge Plan:  [place]  In-House Referral:     Discharge planning Services  CM Consult  Post Acute Care Choice:  Durable Medical Equipment, Home Health Choice offered to:  Patient  DME Arranged:  Walker rolling DME Agency:  [place] Arranged:  RN, PT [facility] Agency:  [place]  Status of Service:  In process, will continue to follow  If discussed at Long Length of Stay Meetings, dates discussed:    Additional Comments:  Jolly Mango, RN 08/26/2016, 9:23 AM

## 2016-08-26 NOTE — Plan of Care (Signed)
Problem: Pain Management: Goal: Pain level will decrease with appropriate interventions Outcome: Progressing Pt pain level is controlled with medication ordered.

## 2016-08-26 NOTE — Progress Notes (Signed)
[facility] [place] NAME: Paul [name]    MR#:  [id number]  DATE OF BIRTH:  01-08-1966  SUBJECTIVE:  CHIEF COMPLAINT:   Chief Complaint  Patient presents with  . Foot Pain   - gout symptoms have improved, but renal function worsened today - wound vac present to left foot  REVIEW OF SYSTEMS:  Review of Systems  Constitutional: Negative for chills, fever and malaise/fatigue.  HENT: Negative for ear discharge, ear pain, hearing loss and nosebleeds.   Eyes: Negative for blurred vision and double vision.  Respiratory: Negative for cough, shortness of breath and wheezing.   Cardiovascular: Positive for leg swelling. Negative for chest pain and palpitations.  Gastrointestinal: Negative for abdominal pain, constipation, diarrhea, nausea and vomiting.  Genitourinary: Negative for dysuria.  Musculoskeletal: Positive for joint pain and myalgias.  Neurological: Negative for dizziness, speech change, focal weakness, seizures and headaches.  Psychiatric/Behavioral: Negative for depression.    DRUG ALLERGIES:   Allergies  Allergen Reactions  . Ibuprofen Other (See Comments)    Heart problems    VITALS:  Blood pressure (!) 145/86, pulse 78, temperature 98.1 F (36.7 C), temperature source Oral, resp. rate 18, height 5\' 10"  (1.778 m), weight 122.9 kg (270 lb 14.4 oz), SpO2 100 %.  PHYSICAL EXAMINATION:  Physical Exam  GENERAL:  51 y.o.-year-old patient lying in the bed with no acute distress.  EYES: Pupils equal, round, reactive to light and accommodation. No scleral icterus. Extraocular muscles intact.  HEENT: Head atraumatic, normocephalic. Oropharynx and nasopharynx clear.  NECK:  Supple, no jugular venous distention. No thyroid enlargement, no tenderness.  LUNGS: Normal breath sounds bilaterally, no wheezing, rales,rhonchi or crepitation. No use of accessory muscles of respiration.  CARDIOVASCULAR: S1, S2 normal. No murmurs, rubs, or  gallops. AICD in place ABDOMEN: Soft, nontender, nondistended. Bowel sounds present. No organomegaly or mass.  EXTREMITIES: No  cyanosis, or clubbing. 2+ Lower extremity edema. 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 Wound VAC in place.  NEUROLOGIC: Cranial nerves II through XII are intact. Muscle strength 5/5 in all extremities. Sensation intact. Gait not checked.  PSYCHIATRIC: The patient is alert and oriented x 3.  SKIN: No obvious rash, lesion, or ulcer.    LABORATORY PANEL:   CBC  Recent Labs Lab 08/23/16 0336  WBC 15.8*  HGB 9.6*  HCT 29.8*  PLT 237   ------------------------------------------------------------------------------------------------------------------  Chemistries   Recent Labs Lab 08/21/16 2306  08/24/16 2021  08/26/16 0445  NA 133*  < >  --   < > 138  K 3.8  < >  --   < > 3.6  CL 102  < >  --   < > 101  CO2 22  < >  --   < > 27  GLUCOSE 262*  < >  --   < > 142*  BUN 45*  < >  --   < > 54*  CREATININE 2.03*  < >  --   < > 2.86*  CALCIUM 8.4*  < >  --   < > 8.6*  MG  --   --  1.4*  --   --   AST 35  --   --   --   --   ALT 21  --   --   --   --   ALKPHOS 133*  --   --   --   --   BILITOT 0.8  --   --   --   --   < > =  values in this interval not displayed. ------------------------------------------------------------------------------------------------------------------  Cardiac Enzymes No results for input(s): TROPONINI in the last 168 hours. ------------------------------------------------------------------------------------------------------------------  RADIOLOGY:  Dg Chest Port 1 View  Result Date: 08/25/2016 CLINICAL DATA:  Cough.  Toe amputation yesterday. EXAM: PORTABLE CHEST 1 VIEW COMPARISON:  11/11/2015 FINDINGS: Pacemaker/AICD appears the same. Previous median sternotomy and CABG. Chronic cardiomegaly and venous hypertension. No infiltrate, collapse or effusion. IMPRESSION: Cardiomegaly and venous hypertension. No  edema, collapse or effusion. Electronically Signed   By: Nelson Chimes M.D.   On: 08/25/2016 09:55    EKG:   Orders placed or performed during the hospital encounter of 08/21/16  . ED EKG  . ED EKG  . EKG 12-Lead  . EKG 12-Lead    ASSESSMENT AND PLAN:   51 y/o M with PMH significant for CAD status post CABG, ischemic cardiomyopathy s/p AICD, hypertension, IDDM, CKD admitted to hospital secondary to right foot ulcer with purulent discharge.  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 -Intraoperative cultures are growing GPC and GNR - Infectious disease consult appreciated. On zosyn, discontinue vancomycin -Had poor blood flow during the surgery, so vascular has been consulted. Status post angiogram and angioplasty to the right leg with successful revascularization. Patient will be discharged on the wound vac -Right foot nonweightbearing at this time. Physical therapy consulted. -Probiotics added  #2 CAD s/p CABG- stable, no active cardiac symptoms - cardiac clearance appreciated. No active symptoms  #3 Systolic CHF- cardiomyopathy ischemia- stable, well compensated. Hold torsemide today Cardiac clearance appreciated s/p AICD On asa, coreg and aldactone. Reaction to lisinopril in the past. Outpatient plans to start ARB if renal function improves  #4 Gout- known h/o gout- acute gouty attack now. On colchicine and Indomethacin. Discontinue prednisone as patient does not want to take it.  #5 ARF on CKD stage III-Baseline creatinine seems to be around 1.8. - had angiogram done and also receiving indomethacin for gout- so renal function worse- discontinue indocin and gentle hydration and discontinue torsemide and monitor  #6 DVT Prophylaxis- SQ heparin  #7 diabetes mellitus-on Lantus and pre-meal insulin. Also on sliding scale insulin  Home health at  discharge   All the records are reviewed and case discussed with Care Management/Social Workerr. Management plans discussed with the patient, family and they are in agreement.  CODE STATUS: Full Code   TOTAL TIME TAKING CARE OF THIS PATIENT: 37 minutes.   POSSIBLE D/C TOMORROW, DEPENDING ON CLINICAL CONDITION.   Marice Angelino M.D on 08/26/2016 at 1:12 PM  Between 7am to 6pm - Pager - [phone]  After 6pm go to [email or web address] - password [email or web address] [facility]  Office  [phone]  CC: Primary care physician; Kandice Hams, MD

## 2016-08-26 NOTE — Progress Notes (Signed)
Will see in am for wound vac change.

## 2016-08-26 NOTE — Progress Notes (Signed)
Paul [name] INFECTIOUS DISEASE PROGRESS NOTE Date of Admission:  08/21/2016     ID: Paul Ala. is a 51 y.o. male with  R foot gangrene and DM foot infection Principal Problem:   Gangrene of foot ([place]) Active Problems:   HTN (hypertension)   Diabetes ([place])   CAD (coronary artery disease)   Chronic systolic CHF (congestive heart failure) (HCC)   Diabetic foot infection (HCC)   Subjective: No fevers, less pain. picc placed  ROS  Eleven systems are reviewed and negative except per hpi  Medications:  Antibiotics Given (last 72 hours)    None     . acidophilus  1 capsule Oral BID  . aspirin  81 mg Oral Daily  . benzonatate  100 mg Oral TID  . carvedilol  12.5 mg Oral BID WC  . colchicine  0.6 mg Oral BID  . heparin  5,000 Units Subcutaneous Q8H  . insulin aspart  0-5 Units Subcutaneous QHS  . insulin aspart  0-9 Units Subcutaneous TID WC  . insulin aspart  3 Units Subcutaneous TID WC  . insulin glargine  18 Units Subcutaneous QHS  . sodium chloride flush  10-40 mL Intracatheter Q12H    Objective: Vital signs in last 24 hours: Temp:  [97.5 F (36.4 C)-98.1 F (36.7 C)] 97.5 F (36.4 C) (04/25 1353) Pulse Rate:  [71-84] 84 (04/25 1353) Resp:  [17-18] 18 (04/25 0745) BP: (113-145)/(74-89) 134/89 (04/25 1353) SpO2:  [93 %-100 %] 99 % (04/25 1353) Constitutional: He is oriented to person, place, and time. He appears well-developed and well-nourished. No distress. obese HENT: anicteric Mouth/Throat: Oropharynx is clear and moist. No oropharyngeal exudate.  Cardiovascular: Normal rate, regular rhythm and normal heart sounds. Exam reveals no gallop and no friction rub.  No murmur heard.  PICC RUE Pulmonary/Chest: Effort normal and breath sounds normal. No respiratory distress. He has no wheezes.  Abdominal: Soft. Bowel sounds are normal. He exhibits no distension. There is no tenderness.  Lymphadenopathy: He has no cervical adenopathy.  Neurological: He is alert  and oriented to person, place, and time. Decreased sensation bil LE Ext - 1+ edema RLE Skin: R foot with wound vac over incision site from dorsum to plantar surface of 3-4 amputation site. Foot is warm Psychiatric: He has a normal mood and affect. His behavior is normal.   Lab Results  Recent Labs  08/25/16 0431 08/26/16 0445  NA 138 138  K 3.8 3.6  CL 100* 101  CO2 27 27  BUN 48* 54*  CREATININE 2.22* 2.86*    Microbiology: Results for orders placed or performed during the hospital encounter of 08/21/16  Culture, blood (routine x 2)     Status: None   Collection Time: 08/21/16 11:05 PM  Result Value Ref Range Status   Specimen Description BLOOD RIGHT WRIST  Final   Special Requests   Final    BOTTLES DRAWN AEROBIC AND ANAEROBIC Blood Culture adequate volume   Culture NO GROWTH 5 DAYS  Final   Report Status 08/26/2016 FINAL  Final  Culture, blood (routine x 2)     Status: None (Preliminary result)   Collection Time: 08/22/16 12:13 AM  Result Value Ref Range Status   Specimen Description BLOOD RIGHT HAND  Final   Special Requests   Final    BOTTLES DRAWN AEROBIC AND ANAEROBIC Blood Culture adequate volume   Culture NO GROWTH 4 DAYS  Final   Report Status PENDING  Incomplete  Aerobic/Anaerobic Culture (surgical/deep wound)  Status: Abnormal (Preliminary result)   Collection Time: 08/22/16  9:15 AM  Result Value Ref Range Status   Specimen Description WOUND Toe R  Final   Special Requests Immunocompromised  Final   Gram Stain   Final    FEW WBC PRESENT, PREDOMINANTLY PMN RARE SQUAMOUS EPITHELIAL CELLS PRESENT ABUNDANT GRAM POSITIVE COCCI IN PAIRS ABUNDANT GRAM NEGATIVE RODS Performed at [facility], [place] [address], [place], [place] [address]    Culture (A)  Final    MULTIPLE ORGANISMS PRESENT, NONE PREDOMINANT AEROBICALLY NO ANAEROBES ISOLATED; CULTURE IN PROGRESS FOR 5 DAYS    Report Status PENDING  Incomplete  Surgical pcr screen     Status: None    Collection Time: 08/22/16  9:16 AM  Result Value Ref Range Status   MRSA, PCR NEGATIVE NEGATIVE Final   Staphylococcus aureus NEGATIVE NEGATIVE Final    Comment:        The Xpert SA Assay (FDA approved for NASAL specimens in patients over 24 years of age), is one component of a comprehensive surveillance program.  Test performance has been validated by [facility] for patients greater than or equal to 75 year old. It is not intended to diagnose infection nor to guide or monitor treatment.   Aerobic/Anaerobic Culture (surgical/deep wound)     Status: None (Preliminary result)   Collection Time: 08/22/16  4:26 PM  Result Value Ref Range Status   Specimen Description ABSCESS RIGHT FOOT  Final   Special Requests PATIENT ON FOLLOWING ZOSYN VANCOMYCIN  Final   Gram Stain   Final    RARE WBC PRESENT, PREDOMINANTLY PMN ABUNDANT GRAM POSITIVE COCCI IN PAIRS ABUNDANT GRAM NEGATIVE COCCOBACILLI ABUNDANT GRAM POSITIVE RODS    Culture   Final    MODERATE PROTEUS PENNERI HOLDING FOR POSSIBLE ANAEROBE Performed at [facility], [place] [address], [place],  [address]    Report Status PENDING  Incomplete   Organism ID, Bacteria PROTEUS PENNERI  Final      Susceptibility   Proteus penneri - MIC*    AMPICILLIN >=32 RESISTANT Resistant     CEFAZOLIN >=64 RESISTANT Resistant     CEFEPIME <=1 SENSITIVE Sensitive     CEFTAZIDIME <=1 SENSITIVE Sensitive     CEFTRIAXONE <=1 SENSITIVE Sensitive     CIPROFLOXACIN <=0.25 SENSITIVE Sensitive     GENTAMICIN <=1 SENSITIVE Sensitive     IMIPENEM 2 SENSITIVE Sensitive     TRIMETH/SULFA <=20 SENSITIVE Sensitive     AMPICILLIN/SULBACTAM 16 INTERMEDIATE Intermediate     PIP/TAZO <=4 SENSITIVE Sensitive     * MODERATE PROTEUS PENNERI    Studies/Results: Dg Chest Port 1 View  Result Date: 08/25/2016 CLINICAL DATA:  Cough.  Toe amputation yesterday. EXAM: PORTABLE CHEST 1 VIEW COMPARISON:  11/11/2015 FINDINGS: Pacemaker/AICD appears the  same. Previous median sternotomy and CABG. Chronic cardiomegaly and venous hypertension. No infiltrate, collapse or effusion. IMPRESSION: Cardiomegaly and venous hypertension. No edema, collapse or effusion. Electronically Signed   By: Nelson Chimes M.D.   On: 08/25/2016 09:55    Assessment/Plan: Paul Franzoni. is a 51 y.o. male admitted with progressive infection of R foot. He has hx of poorly controlled DM (A1c 11.2), CAD, PAD, CKD. On admit wbc 17 , initially AF but spiked to 101. He had I and D done 4/21 with gangrene of R 3 4 toes and and dorsal abscess. Had amputation of R 3rd toe and 4th toe and excision of 3rd and 4th MT head. Had  revascularization with  angiogram done 4/23. ESR 79, CRP 8 Gram stain from abscess is mixed, cx are mixed with proteus, and anaerobes. Has some worsening renal function.  PICC placed.   Recommendations Cont zosyn - will plan on 2-4 weeks of IV zosyn unless cxs reveal resistant organisms. See abx order sheet Will need to monitor renal function. I will see in 2 weeks or so.  Thank you very much for the consult. Will follow with you.  Paul [name] P   08/26/2016, 2:26 PM

## 2016-08-26 NOTE — Progress Notes (Signed)
Pt remaining alert and oriented. No complaints of pain this shift. Picc infusing without difficulty. Minimal drainage in wound vac. Surgical dressing dry and intact. Able to sleep in between care.

## 2016-08-27 DIAGNOSIS — M869 Osteomyelitis, unspecified: Secondary | ICD-10-CM | POA: Diagnosis not present

## 2016-08-27 DIAGNOSIS — Z452 Encounter for adjustment and management of vascular access device: Secondary | ICD-10-CM | POA: Diagnosis not present

## 2016-08-27 DIAGNOSIS — E1152 Type 2 diabetes mellitus with diabetic peripheral angiopathy with gangrene: Secondary | ICD-10-CM | POA: Diagnosis not present

## 2016-08-27 DIAGNOSIS — I2510 Atherosclerotic heart disease of native coronary artery without angina pectoris: Secondary | ICD-10-CM | POA: Diagnosis not present

## 2016-08-27 DIAGNOSIS — Z48812 Encounter for surgical aftercare following surgery on the circulatory system: Secondary | ICD-10-CM | POA: Diagnosis not present

## 2016-08-27 DIAGNOSIS — Z4781 Encounter for orthopedic aftercare following surgical amputation: Secondary | ICD-10-CM | POA: Diagnosis not present

## 2016-08-27 LAB — BASIC METABOLIC PANEL
Anion gap: 8 (ref 5–15)
BUN: 47 mg/dL — ABNORMAL HIGH (ref 6–20)
CALCIUM: 8.6 mg/dL — AB (ref 8.9–10.3)
CO2: 29 mmol/L (ref 22–32)
Chloride: 100 mmol/L — ABNORMAL LOW (ref 101–111)
Creatinine, Ser: 2.22 mg/dL — ABNORMAL HIGH (ref 0.61–1.24)
GFR calc non Af Amer: 33 mL/min — ABNORMAL LOW (ref >60)
GFR, EST AFRICAN AMERICAN: 38 mL/min — AB (ref >60)
GLUCOSE: 106 mg/dL — AB (ref 65–99)
Potassium: 3.7 mmol/L (ref 3.5–5.1)
Sodium: 137 mmol/L (ref 135–145)

## 2016-08-27 LAB — AEROBIC/ANAEROBIC CULTURE W GRAM STAIN (SURGICAL/DEEP WOUND)

## 2016-08-27 LAB — CULTURE, BLOOD (ROUTINE X 2)
Culture: NO GROWTH
Special Requests: ADEQUATE

## 2016-08-27 LAB — GLUCOSE, CAPILLARY: Glucose-Capillary: 92 mg/dL (ref 65–99)

## 2016-08-27 MED ORDER — OXYCODONE HCL 5 MG PO TABS: 5.0000 mg | ORAL_TABLET | Freq: Four times a day (QID) | ORAL | 0 refills | Status: DC | PRN

## 2016-08-27 MED ORDER — PIPERACILLIN-TAZOBACTAM 4.5 G IVPB: 4.5000 g | Freq: Three times a day (TID) | INTRAVENOUS | 0 refills | Status: DC

## 2016-08-27 MED ORDER — COLCHICINE 0.6 MG PO TABS: 0.6000 mg | ORAL_TABLET | Freq: Every day | ORAL | 0 refills | Status: DC | PRN

## 2016-08-27 MED ORDER — TORSEMIDE 20 MG PO TABS: 20.0000 mg | ORAL_TABLET | Freq: Two times a day (BID) | ORAL | 4 refills | Status: DC

## 2016-08-27 MED ORDER — RISAQUAD PO CAPS: 1.0000 | ORAL_CAPSULE | Freq: Every day | ORAL | 0 refills | Status: DC

## 2016-08-27 NOTE — Care Management Note (Addendum)
Case Management Note  Patient Details  Name: Paul [name]. MRN: [id number] Date of Birth: [birth year]  Subjective/Objective:  Discharging today.                  Action/Plan: Corene Cornea with [facility] notified of discharge. They are to provide IV abx.  Wound vac has been delivered.   [facility] to go out to administer 6 pm Zosyn dose today  Expected Discharge Date:  08/23/16               Expected Discharge Plan:  [place]  In-House Referral:     Discharge planning Services  CM Consult  Post Acute Care Choice:  Home Health Choice offered to:  Patient  DME Arranged:    DME Agency:  [place] Arranged:  RN, PT [facility] Agency:  [place]  Status of Service:  Completed, signed off  If discussed at [place] of Stay Meetings, dates discussed:    Additional Comments:  Jolly Mango, RN 08/27/2016, 10:41 AM

## 2016-08-27 NOTE — Progress Notes (Signed)
Changed wound vac to take home wound vac. Post op shoe men's lg placed. All equipment and supplies packed and sent with patient at discharge. Escorted to personal vehicle via staff by wc.

## 2016-08-27 NOTE — Progress Notes (Signed)
Physical Therapy Treatment Patient Details Name: Paul [name]. MRN: [id number] DOB: [birth year] Today's Date: 08/27/2016    History of Present Illness Pt. is a 51 year old male admitted to [facility] on 08/21/16 after 4 days of increased R foot pain. 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     PT Comments    Pt agreeable to PT; continues to complains of more pain in bilateral knees than right foot. Discussion and education on reasons and use of increasing safe movement and use of ice may help. Pt notes pain subsides with movement. Advised to follow up with MD if not improving. Pt received post op off loading shoe; education on care, fit, don/doffing. Pt able to ambulated well with supervision without loss of balance/difficulty and demonstrate 1 step with upper extremity support and Min guard as well. Pt notes he is to be discharged home today; no current order at this time. Pt eager to return home; pt has excellent family support. Continue progressing strength/endurance to improve functional mobility and return to prior level of function.    Follow Up Recommendations  Home health PT     Equipment Recommendations  Rolling walker with 5" wheels    Recommendations for Other Services       Precautions / Restrictions Precautions Precautions: Fall Required Braces or Orthoses: Other Brace/Splint Other Brace/Splint: R forefoot offloading shoe Restrictions Weight Bearing Restrictions: Yes Other Position/Activity Restrictions: Pt now has R offloading shoe; no new wb'g orders in    Mobility  Bed Mobility Overal bed mobility: Modified Independent                Transfers Overall transfer level: Modified independent Equipment used: Rolling walker (2 wheeled) Transfers: Sit to/from Stand Sit to Stand: Modified independent (Device/Increase time)         General transfer comment: Use of R post op off load shoe with WBAT; no  increased pain. Very little pain in R foot at rest and activity  Ambulation/Gait Ambulation/Gait assistance: Supervision Ambulation Distance (Feet): 20 Feet Assistive device: Rolling walker (2 wheeled) Gait Pattern/deviations: Step-to pattern Gait velocity: decreased Gait velocity interpretation: Below normal speed for age/gender General Gait Details: use of R post op off loading shoe. Ambulates without R foot pain, good stability. Education on L shoe wear for even hip level to make ambulation more comfortable   Stairs Stairs: Yes   Stair Management: One rail Left;Step to pattern;Forwards Number of Stairs: 1    Wheelchair Mobility    Modified Rankin (Stroke Patients Only)       Balance Overall balance assessment: Modified Independent                                          Cognition Arousal/Alertness: Awake/alert Behavior During Therapy: WFL for tasks assessed/performed Overall Cognitive Status: Within Functional Limits for tasks assessed                                        Exercises General Exercises - Lower Extremity Ankle Circles/Pumps: AROM;Both;20 reps;Supine Quad Sets: Strengthening;Both;20 reps;Supine Gluteal Sets: Strengthening;Both;20 reps;Supine Long Arc Quad: AROM;Both;20 reps;Seated Heel Slides: AROM;Both;20 reps;Supine Hip ABduction/ADduction: Right;Strengthening;10 reps;Standing Straight Leg Raises: Strengthening;Right;10 reps;Standing Hip Flexion/Marching: Strengthening;Right;10 reps;Standing Other Exercises Other Exercises:  Education on use of activity/ice as needed for knee pain mgmt as well as follow up with MD if not improving Other Exercises: Education on fit and don/doff ortho post op shoe, as well as care    General Comments        Pertinent Vitals/Pain Pain Assessment: 0-10 Pain Score: 6  Pain Location: B knees at rest; improves with movement  Pain Intervention(s): Other (comment) (educated again  on movement to allow joint fluid mobility)    Home Living                      Prior Function            PT Goals (current goals can now be found in the care plan section) Progress towards PT goals: Progressing toward goals    Frequency    7X/week      PT Plan Current plan remains appropriate    Co-evaluation             End of Session   Activity Tolerance: Patient tolerated treatment well Patient left: Other (comment) (seated edge of bed)   PT Visit Diagnosis: Unsteadiness on feet (R26.81);Other abnormalities of gait and mobility (R26.89);Pain Pain - Right/Left: Right Pain - part of body: Ankle and joints of foot     Time: 1901-2224 PT Time Calculation (min) (ACUTE ONLY): 29 min  Charges:  $Gait Training: 8-22 mins $Therapeutic Exercise: 8-22 mins                    G Codes:        Larae Grooms, PTA 08/27/2016, 12:05 PM

## 2016-08-27 NOTE — Progress Notes (Signed)
Daily Progress Note   Subjective  - 3 Days Post-Op  f/u right foot amputation 3/4 and mets.  Doing well.  No complaints with foot.  Objective Vitals:   08/25/16 2343 08/26/16 0745 08/26/16 1353 08/26/16 2315  BP: 113/74 (!) 145/86 134/89 (!) 117/56  Pulse: 71 78 84 77  Resp: 17 18  18   Temp: 97.5 F (36.4 C) 98.1 F (36.7 C) 97.5 F (36.4 C) 98.6 F (37 C)  TempSrc: Oral Oral Oral Oral  SpO2: 93% 100% 99% 95%  Weight:      Height:        Physical Exam: Wound with healthy granular tissue in deep space. No purulence.Maceration to right 5th toe.  Laboratory CBC    Component Value Date/Time   WBC 15.8 (H) 08/23/2016 0336   HGB 9.6 (L) 08/23/2016 0336   HCT 29.8 (L) 08/23/2016 0336   PLT 237 08/23/2016 0336    BMET    Component Value Date/Time   NA 138 08/26/2016 0445   NA 139 06/23/2016 1553   K 3.6 08/26/2016 0445   CL 101 08/26/2016 0445   CO2 27 08/26/2016 0445   GLUCOSE 142 (H) 08/26/2016 0445   BUN 54 (H) 08/26/2016 0445   BUN 46 (H) 06/23/2016 1553   CREATININE 2.86 (H) 08/26/2016 0445   CALCIUM 8.6 (L) 08/26/2016 0445   GFRNONAA 24 (L) 08/26/2016 0445   GFRAA 28 (L) 08/26/2016 0445    Assessment/Planning: Wound is improved.    Wound vac changed. C/W wound vac on d/c  WB to heel only.  Wedge shoe ordered.  IV abx per ID.  f/u with me in 2 weeks.  Samara Deist A  08/27/2016, 8:06 AM

## 2016-08-27 NOTE — Discharge Summary (Signed)
[facility] NAME: Paul [name]    MR#:  [id number]  DATE OF BIRTH:  10/23/1965  DATE OF ADMISSION:  08/21/2016   ADMITTING PHYSICIAN: Lance Coon, MD  DATE OF DISCHARGE: 08/27/2016  1:20 PM  PRIMARY CARE PHYSICIAN: Kandice Hams, MD   ADMISSION DIAGNOSIS:   Gangrene of toe of right foot ([place]) [I96]  DISCHARGE DIAGNOSIS:   Principal Problem:   Gangrene of foot ([place]) Active Problems:   HTN (hypertension)   Diabetes ([place])   CAD (coronary artery disease)   Chronic systolic CHF (congestive heart failure) ([place])   Diabetic foot infection ([place])   SECONDARY DIAGNOSIS:   Past Medical History:  Diagnosis Date  . AICD (automatic cardioverter/defibrillator) present   . Cardiac defibrillator in place   . CHF (congestive heart failure) (HCC)    takes Torsemide daily  . CHF (congestive heart failure) ([place])   . Coronary artery disease   . DDD (degenerative disc disease), lumbosacral    L5-S1  . Diabetes ([place])    Lantus at bedtime  . Hypertension    takes Coreg daily  . Sleep apnea    sleep study yr ago-unable to afford cpap  . Stroke [facility]) 09   no weakness    HOSPITAL COURSE:   51 y/o M with PMH significant for CAD status post CABG, ischemic cardiomyopathy s/p AICD, hypertension, IDDM, CKD admitted to hospital secondary to right foot ulcer with purulent discharge.  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 -Intraoperative cultures are growing GPC and GNR - Infectious disease consult appreciated. On zosyn, discontinued vancomycin as no MRSA -Had poor blood flow during the surgery, so vascular has been consulted. Status post angiogram and angioplasty to the right leg with successful revascularization. Patient will be discharged on the wound vac and IV zosyn for atleast 2-4 weeks -Right foot  nonweightbearing at this time. Physical therapy consulted. -Probiotics added  #2 CAD s/p CABG- stable, no active cardiac symptoms - cardiac clearance appreciated. No active symptoms  #3 Systolic CHF- cardiomyopathy ischemia- stable, well compensated. On torsemide- dose decreased here due to renal fialure Cardiac clearance appreciated s/p AICD On asa, coreg and aldactone. Reaction to lisinopril in the past. Outpatient plans to start ARB if renal function improves  #4 Gout- known h/o gout- acute gouty attack in the hospital. On colchicine .-Indomethacin discontinued due to acute renal failure. Discontinue prednisone as patient does not want to take it. -Much improved gouty symptoms in his hands by the time of discharge  #5 ARF on CKD stage III-Baseline creatinine seems to be around 1.8. To 2.0 - had angiogram done and also received indomethacin for gout- so renal function worse-however was also on vancomycin and Zosyn initially. -Vancomycin discontinued. Indomethacin discontinued. Received gentle electrician. Creatinine is getting closer to baseline.  #6 diabetes mellitus-on Lantus  Home health at discharge  DISCHARGE CONDITIONS:   Guarded  CONSULTS OBTAINED:   Treatment Team:  Samara Deist, DPM Leonel Ramsay, MD  DRUG ALLERGIES:   Allergies  Allergen Reactions  . Ibuprofen Other (See Comments)    Heart problems   DISCHARGE MEDICATIONS:   Allergies as of 08/27/2016      Reactions   Ibuprofen Other (See Comments)   Heart problems      Medication List    STOP taking these medications   indomethacin 50 MG  capsule Commonly known as:  INDOCIN     TAKE these medications   acidophilus Caps capsule Take 1 capsule by mouth daily.   aspirin 81 MG tablet Take 81 mg by mouth daily.   carvedilol 12.5 MG tablet Commonly known as:  COREG Take 12.5 mg by mouth 2 (two) times daily with a meal.   colchicine 0.6 MG tablet Take 1 tablet (0.6 mg total) by mouth  daily as needed.   insulin glargine 100 UNIT/ML injection Commonly known as:  LANTUS Inject 24 Units into the skin at bedtime.   oxyCODONE 5 MG immediate release tablet Commonly known as:  Oxy IR/ROXICODONE Take 1 tablet (5 mg total) by mouth every 6 (six) hours as needed for moderate pain.   piperacillin-tazobactam 4-0.5 GM/100ML Soln IVPB Commonly known as:  ZOSYN Inject 100 mLs (4.5 g total) into the vein every 8 (eight) hours. Until 09/17/16   spironolactone 25 MG tablet Commonly known as:  ALDACTONE Take 25 mg by mouth daily.   torsemide 20 MG tablet Commonly known as:  DEMADEX Take 1 tablet (20 mg total) by mouth 2 (two) times daily. What changed:  how much to take        DISCHARGE INSTRUCTIONS:   1. PCP follow-up in 1-2 weeks 2. ID follow-up in 3 weeks 3. Podiatry follow-up in 2 weeks. Patient will be discharged with the wound VAC  DIET:   Cardiac diet and Diabetic diet  ACTIVITY:   Activity as tolerated  OXYGEN:   Home Oxygen: No.  Oxygen Delivery: room air  DISCHARGE LOCATION:   home   If you experience worsening of your admission symptoms, develop shortness of breath, life threatening emergency, suicidal or homicidal thoughts you must seek medical attention immediately by calling 911 or calling your MD immediately  if symptoms less severe.  You Must read complete instructions/literature along with all the possible adverse reactions/side effects for all the Medicines you take and that have been prescribed to you. Take any new Medicines after you have completely understood and accpet all the possible adverse reactions/side effects.   Please note  You were cared for by a hospitalist during your hospital stay. If you have any questions about your discharge medications or the care you received while you were in the hospital after you are discharged, you can call the unit and asked to speak with the hospitalist on call if the hospitalist that took care of  you is not available. Once you are discharged, your primary care physician will handle any further medical issues. Please note that NO REFILLS for any discharge medications will be authorized once you are discharged, as it is imperative that you return to your primary care physician (or establish a relationship with a primary care physician if you do not have one) for your aftercare needs so that they can reassess your need for medications and monitor your lab values.    On the day of Discharge:  VITAL SIGNS:   Blood pressure (!) 117/56, pulse 75, temperature 98.6 F (37 C), temperature source Oral, resp. rate 18, height 5\' 10"  (1.778 m), weight 122.9 kg (270 lb 14.4 oz), SpO2 96 %.  PHYSICAL EXAMINATION:    GENERAL:  51 y.o.-year-old patient lying in the bed with no acute distress.  EYES: Pupils equal, round, reactive to light and accommodation. No scleral icterus. Extraocular muscles intact.  HEENT: Head atraumatic, normocephalic. Oropharynx and nasopharynx clear.  NECK:  Supple, no jugular venous distention. No thyroid enlargement, no tenderness.  LUNGS: Normal breath sounds bilaterally, no wheezing, rales,rhonchi or crepitation. No use of accessory muscles of respiration.  CARDIOVASCULAR: S1, S2 normal. No murmurs, rubs, or gallops. AICD in place ABDOMEN: Soft, nontender, nondistended. Bowel sounds present. No organomegaly or mass.  EXTREMITIES: No  cyanosis, or clubbing. 2+ Lower extremity edema. 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 Wound VAC in place.  NEUROLOGIC: Cranial nerves II through XII are intact. Muscle strength 5/5 in all extremities. Sensation intact. Gait not checked.  PSYCHIATRIC: The patient is alert and oriented x 3.  SKIN: No obvious rash, lesion, or ulcer.   DATA REVIEW:   CBC  Recent Labs Lab 08/23/16 0336  WBC 15.8*  HGB 9.6*  HCT 29.8*  PLT 237    Chemistries   Recent Labs Lab 08/21/16 2306  08/24/16 2021  08/27/16 0500  NA  133*  < >  --   < > 137  K 3.8  < >  --   < > 3.7  CL 102  < >  --   < > 100*  CO2 22  < >  --   < > 29  GLUCOSE 262*  < >  --   < > 106*  BUN 45*  < >  --   < > 47*  CREATININE 2.03*  < >  --   < > 2.22*  CALCIUM 8.4*  < >  --   < > 8.6*  MG  --   --  1.4*  --   --   AST 35  --   --   --   --   ALT 21  --   --   --   --   ALKPHOS 133*  --   --   --   --   BILITOT 0.8  --   --   --   --   < > = values in this interval not displayed.   Microbiology Results  Results for orders placed or performed during the hospital encounter of 08/21/16  Culture, blood (routine x 2)     Status: None   Collection Time: 08/21/16 11:05 PM  Result Value Ref Range Status   Specimen Description BLOOD RIGHT WRIST  Final   Special Requests   Final    BOTTLES DRAWN AEROBIC AND ANAEROBIC Blood Culture adequate volume   Culture NO GROWTH 5 DAYS  Final   Report Status 08/26/2016 FINAL  Final  Culture, blood (routine x 2)     Status: None   Collection Time: 08/22/16 12:13 AM  Result Value Ref Range Status   Specimen Description BLOOD RIGHT HAND  Final   Special Requests   Final    BOTTLES DRAWN AEROBIC AND ANAEROBIC Blood Culture adequate volume   Culture NO GROWTH 5 DAYS  Final   Report Status 08/27/2016 FINAL  Final  Aerobic/Anaerobic Culture (surgical/deep wound)     Status: Abnormal (Preliminary result)   Collection Time: 08/22/16  9:15 AM  Result Value Ref Range Status   Specimen Description WOUND Toe R  Final   Special Requests Immunocompromised  Final   Gram Stain   Final    FEW WBC PRESENT, PREDOMINANTLY PMN RARE SQUAMOUS EPITHELIAL CELLS PRESENT ABUNDANT GRAM POSITIVE COCCI IN PAIRS ABUNDANT GRAM NEGATIVE RODS    Culture (A)  Final    MULTIPLE ORGANISMS PRESENT, NONE PREDOMINANT AEROBICALLY NO ANAEROBES ISOLATED Performed at [facility], [place] [address], [place], [place] [address]  Report Status PENDING  Incomplete  Surgical pcr screen     Status: None   Collection Time:  08/22/16  9:16 AM  Result Value Ref Range Status   MRSA, PCR NEGATIVE NEGATIVE Final   Staphylococcus aureus NEGATIVE NEGATIVE Final    Comment:        The Xpert SA Assay (FDA approved for NASAL specimens in patients over 67 years of age), is one component of a comprehensive surveillance program.  Test performance has been validated by [facility] for patients greater than or equal to 5 year old. It is not intended to diagnose infection nor to guide or monitor treatment.   Aerobic/Anaerobic Culture (surgical/deep wound)     Status: None (Preliminary result)   Collection Time: 08/22/16  4:26 PM  Result Value Ref Range Status   Specimen Description ABSCESS RIGHT FOOT  Final   Special Requests PATIENT ON FOLLOWING ZOSYN VANCOMYCIN  Final   Gram Stain   Final    RARE WBC PRESENT, PREDOMINANTLY PMN ABUNDANT GRAM POSITIVE COCCI IN PAIRS ABUNDANT GRAM NEGATIVE COCCOBACILLI ABUNDANT GRAM POSITIVE RODS    Culture   Final    MODERATE PROTEUS PENNERI HOLDING FOR POSSIBLE ANAEROBE Performed at [facility], [place] [address] [place], [place] [address]    Report Status PENDING  Incomplete   Organism ID, Bacteria PROTEUS PENNERI  Final      Susceptibility   Proteus penneri - MIC*    AMPICILLIN >=32 RESISTANT Resistant     CEFAZOLIN >=64 RESISTANT Resistant     CEFEPIME <=1 SENSITIVE Sensitive     CEFTAZIDIME <=1 SENSITIVE Sensitive     CEFTRIAXONE <=1 SENSITIVE Sensitive     CIPROFLOXACIN <=0.25 SENSITIVE Sensitive     GENTAMICIN <=1 SENSITIVE Sensitive     IMIPENEM 2 SENSITIVE Sensitive     TRIMETH/SULFA <=20 SENSITIVE Sensitive     AMPICILLIN/SULBACTAM 16 INTERMEDIATE Intermediate     PIP/TAZO <=4 SENSITIVE Sensitive     * MODERATE PROTEUS PENNERI    RADIOLOGY:  No results found.   Management plans discussed with the patient, family and they are in agreement.  CODE STATUS:     Code Status Orders        Start     Ordered   08/22/16 0043  Full code  Continuous      08/22/16 0042    Code Status History    Date Active Date Inactive Code Status Order ID Comments User Context   07/18/2014  6:18 PM 07/20/2014  8:02 PM Full Code [id number]  Leighton Parody, PA-C Inpatient      TOTAL TIME TAKING CARE OF THIS PATIENT: 38 minutes.    Gladstone Lighter M.D on 08/27/2016 at 1:48 PM  Between 7am to 6pm - Pager - [phone]  After 6pm go to [email or web address] - Proofreader  [facility]  Office  [phone]  CC: Primary care physician; Kandice Hams, MD   Note: This dictation was prepared with Dragon dictation along with smaller phrase technology. Any transcriptional errors that result from this process are unintentional.

## 2016-08-27 NOTE — Care Management Important Message (Signed)
Important Message  Patient Details  Name: Paul [name]. MRN: [id number] Date of Birth: 12-03-1965   Medicare Important Message Given:  Yes    Jolly Mango, RN 08/27/2016, 10:33 AM

## 2016-08-27 NOTE — Progress Notes (Signed)
Discharge summary reviewed with verbal understanding. [place] nursing with be at residence to teach and infuse ABT. One narcotic Rx given upon discharge.

## 2016-08-27 NOTE — Plan of Care (Signed)
Problem: Pain Managment: Goal: General experience of comfort will improve Outcome: Progressing Patient w/o complaints of pain. States does not want to take "drugs". Offered non opoid pain relief

## 2016-08-28 LAB — AEROBIC/ANAEROBIC CULTURE W GRAM STAIN (SURGICAL/DEEP WOUND)

## 2016-08-29 DIAGNOSIS — M869 Osteomyelitis, unspecified: Secondary | ICD-10-CM | POA: Diagnosis not present

## 2016-08-29 DIAGNOSIS — Z48812 Encounter for surgical aftercare following surgery on the circulatory system: Secondary | ICD-10-CM | POA: Diagnosis not present

## 2016-08-29 DIAGNOSIS — Z452 Encounter for adjustment and management of vascular access device: Secondary | ICD-10-CM | POA: Diagnosis not present

## 2016-08-29 DIAGNOSIS — Z4781 Encounter for orthopedic aftercare following surgical amputation: Secondary | ICD-10-CM | POA: Diagnosis not present

## 2016-08-29 DIAGNOSIS — I2510 Atherosclerotic heart disease of native coronary artery without angina pectoris: Secondary | ICD-10-CM | POA: Diagnosis not present

## 2016-08-29 DIAGNOSIS — E1152 Type 2 diabetes mellitus with diabetic peripheral angiopathy with gangrene: Secondary | ICD-10-CM | POA: Diagnosis not present

## 2016-08-31 DIAGNOSIS — Z48812 Encounter for surgical aftercare following surgery on the circulatory system: Secondary | ICD-10-CM | POA: Diagnosis not present

## 2016-08-31 DIAGNOSIS — I96 Gangrene, not elsewhere classified: Secondary | ICD-10-CM | POA: Diagnosis not present

## 2016-08-31 DIAGNOSIS — Z452 Encounter for adjustment and management of vascular access device: Secondary | ICD-10-CM | POA: Diagnosis not present

## 2016-08-31 DIAGNOSIS — I2510 Atherosclerotic heart disease of native coronary artery without angina pectoris: Secondary | ICD-10-CM | POA: Diagnosis not present

## 2016-08-31 DIAGNOSIS — M869 Osteomyelitis, unspecified: Secondary | ICD-10-CM | POA: Diagnosis not present

## 2016-08-31 DIAGNOSIS — E1152 Type 2 diabetes mellitus with diabetic peripheral angiopathy with gangrene: Secondary | ICD-10-CM | POA: Diagnosis not present

## 2016-08-31 DIAGNOSIS — R2689 Other abnormalities of gait and mobility: Secondary | ICD-10-CM | POA: Diagnosis not present

## 2016-08-31 DIAGNOSIS — Z4781 Encounter for orthopedic aftercare following surgical amputation: Secondary | ICD-10-CM | POA: Diagnosis not present

## 2016-08-31 DIAGNOSIS — G4733 Obstructive sleep apnea (adult) (pediatric): Secondary | ICD-10-CM | POA: Diagnosis not present

## 2016-09-01 DIAGNOSIS — Z48812 Encounter for surgical aftercare following surgery on the circulatory system: Secondary | ICD-10-CM | POA: Diagnosis not present

## 2016-09-01 DIAGNOSIS — E1151 Type 2 diabetes mellitus with diabetic peripheral angiopathy without gangrene: Secondary | ICD-10-CM | POA: Diagnosis not present

## 2016-09-01 DIAGNOSIS — M869 Osteomyelitis, unspecified: Secondary | ICD-10-CM | POA: Diagnosis not present

## 2016-09-01 DIAGNOSIS — Z4781 Encounter for orthopedic aftercare following surgical amputation: Secondary | ICD-10-CM | POA: Diagnosis not present

## 2016-09-01 DIAGNOSIS — E1152 Type 2 diabetes mellitus with diabetic peripheral angiopathy with gangrene: Secondary | ICD-10-CM | POA: Diagnosis not present

## 2016-09-01 DIAGNOSIS — I509 Heart failure, unspecified: Secondary | ICD-10-CM | POA: Diagnosis not present

## 2016-09-01 DIAGNOSIS — N183 Chronic kidney disease, stage 3 (moderate): Secondary | ICD-10-CM | POA: Diagnosis not present

## 2016-09-01 DIAGNOSIS — I739 Peripheral vascular disease, unspecified: Secondary | ICD-10-CM | POA: Diagnosis not present

## 2016-09-01 DIAGNOSIS — Z452 Encounter for adjustment and management of vascular access device: Secondary | ICD-10-CM | POA: Diagnosis not present

## 2016-09-01 DIAGNOSIS — I2510 Atherosclerotic heart disease of native coronary artery without angina pectoris: Secondary | ICD-10-CM | POA: Diagnosis not present

## 2016-09-01 DIAGNOSIS — Z794 Long term (current) use of insulin: Secondary | ICD-10-CM | POA: Diagnosis not present

## 2016-09-01 DIAGNOSIS — Z7984 Long term (current) use of oral hypoglycemic drugs: Secondary | ICD-10-CM | POA: Diagnosis not present

## 2016-09-01 DIAGNOSIS — E1122 Type 2 diabetes mellitus with diabetic chronic kidney disease: Secondary | ICD-10-CM | POA: Diagnosis not present

## 2016-09-01 DIAGNOSIS — I10 Essential (primary) hypertension: Secondary | ICD-10-CM | POA: Diagnosis not present

## 2016-09-02 DIAGNOSIS — M869 Osteomyelitis, unspecified: Secondary | ICD-10-CM | POA: Diagnosis not present

## 2016-09-02 DIAGNOSIS — Z4781 Encounter for orthopedic aftercare following surgical amputation: Secondary | ICD-10-CM | POA: Diagnosis not present

## 2016-09-02 DIAGNOSIS — I2510 Atherosclerotic heart disease of native coronary artery without angina pectoris: Secondary | ICD-10-CM | POA: Diagnosis not present

## 2016-09-02 DIAGNOSIS — Z452 Encounter for adjustment and management of vascular access device: Secondary | ICD-10-CM | POA: Diagnosis not present

## 2016-09-02 DIAGNOSIS — E1152 Type 2 diabetes mellitus with diabetic peripheral angiopathy with gangrene: Secondary | ICD-10-CM | POA: Diagnosis not present

## 2016-09-02 DIAGNOSIS — Z48812 Encounter for surgical aftercare following surgery on the circulatory system: Secondary | ICD-10-CM | POA: Diagnosis not present

## 2016-09-04 ENCOUNTER — Other Ambulatory Visit: Payer: Self-pay | Admitting: Cardiology

## 2016-09-04 ENCOUNTER — Ambulatory Visit: Payer: Medicare Other

## 2016-09-04 DIAGNOSIS — Z452 Encounter for adjustment and management of vascular access device: Secondary | ICD-10-CM | POA: Diagnosis not present

## 2016-09-04 DIAGNOSIS — Z4781 Encounter for orthopedic aftercare following surgical amputation: Secondary | ICD-10-CM | POA: Diagnosis not present

## 2016-09-04 DIAGNOSIS — Z95828 Presence of other vascular implants and grafts: Secondary | ICD-10-CM

## 2016-09-04 DIAGNOSIS — Z9889 Other specified postprocedural states: Secondary | ICD-10-CM

## 2016-09-04 DIAGNOSIS — I6529 Occlusion and stenosis of unspecified carotid artery: Secondary | ICD-10-CM

## 2016-09-04 DIAGNOSIS — I2510 Atherosclerotic heart disease of native coronary artery without angina pectoris: Secondary | ICD-10-CM | POA: Diagnosis not present

## 2016-09-04 DIAGNOSIS — E1152 Type 2 diabetes mellitus with diabetic peripheral angiopathy with gangrene: Secondary | ICD-10-CM | POA: Diagnosis not present

## 2016-09-04 DIAGNOSIS — M869 Osteomyelitis, unspecified: Secondary | ICD-10-CM | POA: Diagnosis not present

## 2016-09-04 DIAGNOSIS — Z48812 Encounter for surgical aftercare following surgery on the circulatory system: Secondary | ICD-10-CM | POA: Diagnosis not present

## 2016-09-04 LAB — VAS US CAROTID
LCCADDIAS: 33 cm/s
LCCAPDIAS: 29 cm/s
LCCAPSYS: 105 cm/s
LEFT ECA DIAS: 102 cm/s
LEFT VERTEBRAL DIAS: -25 cm/s
LICADDIAS: -68 cm/s
LICADSYS: -151 cm/s
LICAPSYS: -103 cm/s
Left CCA dist sys: 80 cm/s
Left ICA prox dias: -42 cm/s
RIGHT ECA DIAS: -18 cm/s
RIGHT VERTEBRAL DIAS: 45 cm/s
Right CCA prox dias: -34 cm/s
Right CCA prox sys: -93 cm/s
Right cca dist sys: -100 cm/s

## 2016-09-07 DIAGNOSIS — Z4781 Encounter for orthopedic aftercare following surgical amputation: Secondary | ICD-10-CM | POA: Diagnosis not present

## 2016-09-07 DIAGNOSIS — E1152 Type 2 diabetes mellitus with diabetic peripheral angiopathy with gangrene: Secondary | ICD-10-CM | POA: Diagnosis not present

## 2016-09-07 DIAGNOSIS — Z452 Encounter for adjustment and management of vascular access device: Secondary | ICD-10-CM | POA: Diagnosis not present

## 2016-09-07 DIAGNOSIS — R2689 Other abnormalities of gait and mobility: Secondary | ICD-10-CM | POA: Diagnosis not present

## 2016-09-07 DIAGNOSIS — I96 Gangrene, not elsewhere classified: Secondary | ICD-10-CM | POA: Diagnosis not present

## 2016-09-07 DIAGNOSIS — M869 Osteomyelitis, unspecified: Secondary | ICD-10-CM | POA: Diagnosis not present

## 2016-09-07 DIAGNOSIS — I2510 Atherosclerotic heart disease of native coronary artery without angina pectoris: Secondary | ICD-10-CM | POA: Diagnosis not present

## 2016-09-07 DIAGNOSIS — Z48812 Encounter for surgical aftercare following surgery on the circulatory system: Secondary | ICD-10-CM | POA: Diagnosis not present

## 2016-09-07 DIAGNOSIS — G4733 Obstructive sleep apnea (adult) (pediatric): Secondary | ICD-10-CM | POA: Diagnosis not present

## 2016-09-08 ENCOUNTER — Telehealth: Payer: Self-pay | Admitting: *Deleted

## 2016-09-08 DIAGNOSIS — I5022 Chronic systolic (congestive) heart failure: Secondary | ICD-10-CM

## 2016-09-08 NOTE — Telephone Encounter (Signed)
Reviewed results and recommendations with patient and he verbalized understanding. Also confirmed upcoming appointment with Dr. Saunders Revel next month as well. He verbalized agreement with plan, and had no further questions at this time.

## 2016-09-08 NOTE — Telephone Encounter (Signed)
-----   Message from Wende Bushy, MD sent at 09/05/2016 12:10 PM EDT ----- Heterogeneous plaque, bilaterally. 40-59% bilateral ICA stenosis, s/p left carotid stent. >50% LECA stenosis. Patent vertebral arteries with antegrade flow. Normal subclavian arteries, bilaterally.  1 year f/u  --> stent on L patent, cont ASA 81. rec statin therapy. Do not see recent cmp and flp. Need those first. ffup study in 1 yr

## 2016-09-09 DIAGNOSIS — E1152 Type 2 diabetes mellitus with diabetic peripheral angiopathy with gangrene: Secondary | ICD-10-CM | POA: Diagnosis not present

## 2016-09-09 DIAGNOSIS — I2510 Atherosclerotic heart disease of native coronary artery without angina pectoris: Secondary | ICD-10-CM | POA: Diagnosis not present

## 2016-09-09 DIAGNOSIS — Z4781 Encounter for orthopedic aftercare following surgical amputation: Secondary | ICD-10-CM | POA: Diagnosis not present

## 2016-09-09 DIAGNOSIS — Z48812 Encounter for surgical aftercare following surgery on the circulatory system: Secondary | ICD-10-CM | POA: Diagnosis not present

## 2016-09-09 DIAGNOSIS — M869 Osteomyelitis, unspecified: Secondary | ICD-10-CM | POA: Diagnosis not present

## 2016-09-09 DIAGNOSIS — Z452 Encounter for adjustment and management of vascular access device: Secondary | ICD-10-CM | POA: Diagnosis not present

## 2016-09-11 DIAGNOSIS — Z452 Encounter for adjustment and management of vascular access device: Secondary | ICD-10-CM | POA: Diagnosis not present

## 2016-09-11 DIAGNOSIS — Z4781 Encounter for orthopedic aftercare following surgical amputation: Secondary | ICD-10-CM | POA: Diagnosis not present

## 2016-09-11 DIAGNOSIS — Z0000 Encounter for general adult medical examination without abnormal findings: Secondary | ICD-10-CM | POA: Diagnosis not present

## 2016-09-11 DIAGNOSIS — E1152 Type 2 diabetes mellitus with diabetic peripheral angiopathy with gangrene: Secondary | ICD-10-CM | POA: Diagnosis not present

## 2016-09-11 DIAGNOSIS — M869 Osteomyelitis, unspecified: Secondary | ICD-10-CM | POA: Diagnosis not present

## 2016-09-11 DIAGNOSIS — I2510 Atherosclerotic heart disease of native coronary artery without angina pectoris: Secondary | ICD-10-CM | POA: Diagnosis not present

## 2016-09-11 DIAGNOSIS — Z48812 Encounter for surgical aftercare following surgery on the circulatory system: Secondary | ICD-10-CM | POA: Diagnosis not present

## 2016-09-13 DIAGNOSIS — E1152 Type 2 diabetes mellitus with diabetic peripheral angiopathy with gangrene: Secondary | ICD-10-CM | POA: Diagnosis not present

## 2016-09-13 DIAGNOSIS — I2510 Atherosclerotic heart disease of native coronary artery without angina pectoris: Secondary | ICD-10-CM | POA: Diagnosis not present

## 2016-09-13 DIAGNOSIS — M869 Osteomyelitis, unspecified: Secondary | ICD-10-CM | POA: Diagnosis not present

## 2016-09-13 DIAGNOSIS — Z4781 Encounter for orthopedic aftercare following surgical amputation: Secondary | ICD-10-CM | POA: Diagnosis not present

## 2016-09-13 DIAGNOSIS — Z48812 Encounter for surgical aftercare following surgery on the circulatory system: Secondary | ICD-10-CM | POA: Diagnosis not present

## 2016-09-13 DIAGNOSIS — Z452 Encounter for adjustment and management of vascular access device: Secondary | ICD-10-CM | POA: Diagnosis not present

## 2016-09-14 DIAGNOSIS — I96 Gangrene, not elsewhere classified: Secondary | ICD-10-CM | POA: Diagnosis not present

## 2016-09-14 DIAGNOSIS — L97509 Non-pressure chronic ulcer of other part of unspecified foot with unspecified severity: Secondary | ICD-10-CM | POA: Diagnosis not present

## 2016-09-14 DIAGNOSIS — R2689 Other abnormalities of gait and mobility: Secondary | ICD-10-CM | POA: Diagnosis not present

## 2016-09-14 DIAGNOSIS — E11621 Type 2 diabetes mellitus with foot ulcer: Secondary | ICD-10-CM | POA: Diagnosis not present

## 2016-09-14 DIAGNOSIS — M869 Osteomyelitis, unspecified: Secondary | ICD-10-CM | POA: Diagnosis not present

## 2016-09-14 DIAGNOSIS — Z452 Encounter for adjustment and management of vascular access device: Secondary | ICD-10-CM | POA: Diagnosis not present

## 2016-09-14 DIAGNOSIS — Z792 Long term (current) use of antibiotics: Secondary | ICD-10-CM | POA: Diagnosis not present

## 2016-09-14 DIAGNOSIS — I2510 Atherosclerotic heart disease of native coronary artery without angina pectoris: Secondary | ICD-10-CM | POA: Diagnosis not present

## 2016-09-14 DIAGNOSIS — Z4781 Encounter for orthopedic aftercare following surgical amputation: Secondary | ICD-10-CM | POA: Diagnosis not present

## 2016-09-14 DIAGNOSIS — E1169 Type 2 diabetes mellitus with other specified complication: Secondary | ICD-10-CM | POA: Diagnosis not present

## 2016-09-14 DIAGNOSIS — G4733 Obstructive sleep apnea (adult) (pediatric): Secondary | ICD-10-CM | POA: Diagnosis not present

## 2016-09-14 DIAGNOSIS — E1152 Type 2 diabetes mellitus with diabetic peripheral angiopathy with gangrene: Secondary | ICD-10-CM | POA: Diagnosis not present

## 2016-09-14 DIAGNOSIS — Z48812 Encounter for surgical aftercare following surgery on the circulatory system: Secondary | ICD-10-CM | POA: Diagnosis not present

## 2016-09-16 DIAGNOSIS — I2510 Atherosclerotic heart disease of native coronary artery without angina pectoris: Secondary | ICD-10-CM | POA: Diagnosis not present

## 2016-09-16 DIAGNOSIS — M869 Osteomyelitis, unspecified: Secondary | ICD-10-CM | POA: Diagnosis not present

## 2016-09-16 DIAGNOSIS — E1152 Type 2 diabetes mellitus with diabetic peripheral angiopathy with gangrene: Secondary | ICD-10-CM | POA: Diagnosis not present

## 2016-09-16 DIAGNOSIS — Z4781 Encounter for orthopedic aftercare following surgical amputation: Secondary | ICD-10-CM | POA: Diagnosis not present

## 2016-09-16 DIAGNOSIS — Z48812 Encounter for surgical aftercare following surgery on the circulatory system: Secondary | ICD-10-CM | POA: Diagnosis not present

## 2016-09-16 DIAGNOSIS — Z452 Encounter for adjustment and management of vascular access device: Secondary | ICD-10-CM | POA: Diagnosis not present

## 2016-09-17 DIAGNOSIS — E1152 Type 2 diabetes mellitus with diabetic peripheral angiopathy with gangrene: Secondary | ICD-10-CM | POA: Diagnosis not present

## 2016-09-17 DIAGNOSIS — Z452 Encounter for adjustment and management of vascular access device: Secondary | ICD-10-CM | POA: Diagnosis not present

## 2016-09-17 DIAGNOSIS — Z48812 Encounter for surgical aftercare following surgery on the circulatory system: Secondary | ICD-10-CM | POA: Diagnosis not present

## 2016-09-17 DIAGNOSIS — Z4781 Encounter for orthopedic aftercare following surgical amputation: Secondary | ICD-10-CM | POA: Diagnosis not present

## 2016-09-17 DIAGNOSIS — I2510 Atherosclerotic heart disease of native coronary artery without angina pectoris: Secondary | ICD-10-CM | POA: Diagnosis not present

## 2016-09-17 DIAGNOSIS — M869 Osteomyelitis, unspecified: Secondary | ICD-10-CM | POA: Diagnosis not present

## 2016-09-18 DIAGNOSIS — Z48812 Encounter for surgical aftercare following surgery on the circulatory system: Secondary | ICD-10-CM | POA: Diagnosis not present

## 2016-09-18 DIAGNOSIS — E1152 Type 2 diabetes mellitus with diabetic peripheral angiopathy with gangrene: Secondary | ICD-10-CM | POA: Diagnosis not present

## 2016-09-18 DIAGNOSIS — Z452 Encounter for adjustment and management of vascular access device: Secondary | ICD-10-CM | POA: Diagnosis not present

## 2016-09-18 DIAGNOSIS — M869 Osteomyelitis, unspecified: Secondary | ICD-10-CM | POA: Diagnosis not present

## 2016-09-18 DIAGNOSIS — I2510 Atherosclerotic heart disease of native coronary artery without angina pectoris: Secondary | ICD-10-CM | POA: Diagnosis not present

## 2016-09-18 DIAGNOSIS — Z4781 Encounter for orthopedic aftercare following surgical amputation: Secondary | ICD-10-CM | POA: Diagnosis not present

## 2016-09-21 DIAGNOSIS — M869 Osteomyelitis, unspecified: Secondary | ICD-10-CM | POA: Diagnosis not present

## 2016-09-21 DIAGNOSIS — Z48812 Encounter for surgical aftercare following surgery on the circulatory system: Secondary | ICD-10-CM | POA: Diagnosis not present

## 2016-09-21 DIAGNOSIS — I2510 Atherosclerotic heart disease of native coronary artery without angina pectoris: Secondary | ICD-10-CM | POA: Diagnosis not present

## 2016-09-21 DIAGNOSIS — Z4781 Encounter for orthopedic aftercare following surgical amputation: Secondary | ICD-10-CM | POA: Diagnosis not present

## 2016-09-21 DIAGNOSIS — Z452 Encounter for adjustment and management of vascular access device: Secondary | ICD-10-CM | POA: Diagnosis not present

## 2016-09-21 DIAGNOSIS — E1152 Type 2 diabetes mellitus with diabetic peripheral angiopathy with gangrene: Secondary | ICD-10-CM | POA: Diagnosis not present

## 2016-09-22 ENCOUNTER — Ambulatory Visit (INDEPENDENT_AMBULATORY_CARE_PROVIDER_SITE_OTHER): Payer: Medicare Other | Admitting: *Deleted

## 2016-09-22 DIAGNOSIS — I5022 Chronic systolic (congestive) heart failure: Secondary | ICD-10-CM

## 2016-09-22 NOTE — Progress Notes (Signed)
Remote ICD transmission.   

## 2016-09-23 ENCOUNTER — Encounter: Payer: Self-pay | Admitting: Cardiology

## 2016-09-23 DIAGNOSIS — M869 Osteomyelitis, unspecified: Secondary | ICD-10-CM | POA: Diagnosis not present

## 2016-09-23 DIAGNOSIS — E1152 Type 2 diabetes mellitus with diabetic peripheral angiopathy with gangrene: Secondary | ICD-10-CM | POA: Diagnosis not present

## 2016-09-23 DIAGNOSIS — Z48812 Encounter for surgical aftercare following surgery on the circulatory system: Secondary | ICD-10-CM | POA: Diagnosis not present

## 2016-09-23 DIAGNOSIS — I2510 Atherosclerotic heart disease of native coronary artery without angina pectoris: Secondary | ICD-10-CM | POA: Diagnosis not present

## 2016-09-23 DIAGNOSIS — Z4781 Encounter for orthopedic aftercare following surgical amputation: Secondary | ICD-10-CM | POA: Diagnosis not present

## 2016-09-23 DIAGNOSIS — Z452 Encounter for adjustment and management of vascular access device: Secondary | ICD-10-CM | POA: Diagnosis not present

## 2016-09-25 DIAGNOSIS — M869 Osteomyelitis, unspecified: Secondary | ICD-10-CM | POA: Diagnosis not present

## 2016-09-25 DIAGNOSIS — Z48812 Encounter for surgical aftercare following surgery on the circulatory system: Secondary | ICD-10-CM | POA: Diagnosis not present

## 2016-09-25 DIAGNOSIS — Z4781 Encounter for orthopedic aftercare following surgical amputation: Secondary | ICD-10-CM | POA: Diagnosis not present

## 2016-09-25 DIAGNOSIS — E1152 Type 2 diabetes mellitus with diabetic peripheral angiopathy with gangrene: Secondary | ICD-10-CM | POA: Diagnosis not present

## 2016-09-25 DIAGNOSIS — Z452 Encounter for adjustment and management of vascular access device: Secondary | ICD-10-CM | POA: Diagnosis not present

## 2016-09-25 DIAGNOSIS — I2510 Atherosclerotic heart disease of native coronary artery without angina pectoris: Secondary | ICD-10-CM | POA: Diagnosis not present

## 2016-09-28 DIAGNOSIS — M869 Osteomyelitis, unspecified: Secondary | ICD-10-CM | POA: Diagnosis not present

## 2016-09-28 DIAGNOSIS — I2510 Atherosclerotic heart disease of native coronary artery without angina pectoris: Secondary | ICD-10-CM | POA: Diagnosis not present

## 2016-09-28 DIAGNOSIS — Z48812 Encounter for surgical aftercare following surgery on the circulatory system: Secondary | ICD-10-CM | POA: Diagnosis not present

## 2016-09-28 DIAGNOSIS — E1152 Type 2 diabetes mellitus with diabetic peripheral angiopathy with gangrene: Secondary | ICD-10-CM | POA: Diagnosis not present

## 2016-09-28 DIAGNOSIS — Z452 Encounter for adjustment and management of vascular access device: Secondary | ICD-10-CM | POA: Diagnosis not present

## 2016-09-28 DIAGNOSIS — Z4781 Encounter for orthopedic aftercare following surgical amputation: Secondary | ICD-10-CM | POA: Diagnosis not present

## 2016-09-29 LAB — CUP PACEART REMOTE DEVICE CHECK
Implantable Pulse Generator Implant Date: 20110215
Lead Channel Setting Pacing Amplitude: 2.4 V
Lead Channel Setting Pacing Pulse Width: 0.4 ms
MDC IDC MSMT BATTERY VOLTAGE: 2.92 V
MDC IDC SESS DTM: 20180529153807
MDC IDC SET LEADCHNL RV PACING AMPLITUDE: 2 V
Pulse Gen Serial Number: 60489978

## 2016-09-30 ENCOUNTER — Telehealth: Payer: Self-pay | Admitting: Cardiology

## 2016-09-30 DIAGNOSIS — Z452 Encounter for adjustment and management of vascular access device: Secondary | ICD-10-CM | POA: Diagnosis not present

## 2016-09-30 DIAGNOSIS — E1152 Type 2 diabetes mellitus with diabetic peripheral angiopathy with gangrene: Secondary | ICD-10-CM | POA: Diagnosis not present

## 2016-09-30 DIAGNOSIS — I96 Gangrene, not elsewhere classified: Secondary | ICD-10-CM | POA: Diagnosis not present

## 2016-09-30 DIAGNOSIS — Z792 Long term (current) use of antibiotics: Secondary | ICD-10-CM | POA: Diagnosis not present

## 2016-09-30 DIAGNOSIS — Z4781 Encounter for orthopedic aftercare following surgical amputation: Secondary | ICD-10-CM | POA: Diagnosis not present

## 2016-09-30 DIAGNOSIS — I2510 Atherosclerotic heart disease of native coronary artery without angina pectoris: Secondary | ICD-10-CM | POA: Diagnosis not present

## 2016-09-30 DIAGNOSIS — Z48812 Encounter for surgical aftercare following surgery on the circulatory system: Secondary | ICD-10-CM | POA: Diagnosis not present

## 2016-09-30 DIAGNOSIS — L089 Local infection of the skin and subcutaneous tissue, unspecified: Secondary | ICD-10-CM | POA: Diagnosis not present

## 2016-09-30 DIAGNOSIS — E11628 Type 2 diabetes mellitus with other skin complications: Secondary | ICD-10-CM | POA: Diagnosis not present

## 2016-09-30 DIAGNOSIS — L97513 Non-pressure chronic ulcer of other part of right foot with necrosis of muscle: Secondary | ICD-10-CM | POA: Diagnosis not present

## 2016-09-30 DIAGNOSIS — M869 Osteomyelitis, unspecified: Secondary | ICD-10-CM | POA: Diagnosis not present

## 2016-09-30 NOTE — Telephone Encounter (Signed)
Informed Lori, with Dr. Vickki Muff, that Dr. Curt Bears is currently on vacation and will not be able to address this until he returns next week.  Explained that I would review w/ doctor on 6/5 and let them know asap being that procedure is next Friday.

## 2016-09-30 NOTE — Telephone Encounter (Signed)
New message     Request for surgical clearance:  1. What type of surgery is being performed? Place graft on foot and debreavement  2. When is this surgery scheduled? 10/09/16  3. Are there any medications that need to be held prior to surgery and how long? Not sure - pt needs surgical clearance  4. Name of physician performing surgery? Dr Vickki Muff  5. What is your office phone and fax number? Fax [phone]

## 2016-10-01 ENCOUNTER — Other Ambulatory Visit: Payer: Self-pay | Admitting: Podiatry

## 2016-10-01 DIAGNOSIS — Z48812 Encounter for surgical aftercare following surgery on the circulatory system: Secondary | ICD-10-CM | POA: Diagnosis not present

## 2016-10-01 DIAGNOSIS — I2510 Atherosclerotic heart disease of native coronary artery without angina pectoris: Secondary | ICD-10-CM | POA: Diagnosis not present

## 2016-10-01 DIAGNOSIS — Z452 Encounter for adjustment and management of vascular access device: Secondary | ICD-10-CM | POA: Diagnosis not present

## 2016-10-01 DIAGNOSIS — M869 Osteomyelitis, unspecified: Secondary | ICD-10-CM | POA: Diagnosis not present

## 2016-10-01 DIAGNOSIS — Z4781 Encounter for orthopedic aftercare following surgical amputation: Secondary | ICD-10-CM | POA: Diagnosis not present

## 2016-10-01 DIAGNOSIS — E1152 Type 2 diabetes mellitus with diabetic peripheral angiopathy with gangrene: Secondary | ICD-10-CM | POA: Diagnosis not present

## 2016-10-02 DIAGNOSIS — Z4781 Encounter for orthopedic aftercare following surgical amputation: Secondary | ICD-10-CM | POA: Diagnosis not present

## 2016-10-02 DIAGNOSIS — I2510 Atherosclerotic heart disease of native coronary artery without angina pectoris: Secondary | ICD-10-CM | POA: Diagnosis not present

## 2016-10-02 DIAGNOSIS — Z48812 Encounter for surgical aftercare following surgery on the circulatory system: Secondary | ICD-10-CM | POA: Diagnosis not present

## 2016-10-02 DIAGNOSIS — Z452 Encounter for adjustment and management of vascular access device: Secondary | ICD-10-CM | POA: Diagnosis not present

## 2016-10-02 DIAGNOSIS — E1152 Type 2 diabetes mellitus with diabetic peripheral angiopathy with gangrene: Secondary | ICD-10-CM | POA: Diagnosis not present

## 2016-10-02 DIAGNOSIS — M869 Osteomyelitis, unspecified: Secondary | ICD-10-CM | POA: Diagnosis not present

## 2016-10-05 DIAGNOSIS — Z4781 Encounter for orthopedic aftercare following surgical amputation: Secondary | ICD-10-CM | POA: Diagnosis not present

## 2016-10-05 DIAGNOSIS — Z48812 Encounter for surgical aftercare following surgery on the circulatory system: Secondary | ICD-10-CM | POA: Diagnosis not present

## 2016-10-05 DIAGNOSIS — I2510 Atherosclerotic heart disease of native coronary artery without angina pectoris: Secondary | ICD-10-CM | POA: Diagnosis not present

## 2016-10-05 DIAGNOSIS — Z452 Encounter for adjustment and management of vascular access device: Secondary | ICD-10-CM | POA: Diagnosis not present

## 2016-10-05 DIAGNOSIS — M869 Osteomyelitis, unspecified: Secondary | ICD-10-CM | POA: Diagnosis not present

## 2016-10-05 DIAGNOSIS — E1152 Type 2 diabetes mellitus with diabetic peripheral angiopathy with gangrene: Secondary | ICD-10-CM | POA: Diagnosis not present

## 2016-10-06 ENCOUNTER — Encounter
Admission: RE | Admit: 2016-10-06 | Discharge: 2016-10-06 | Disposition: A | Discharge status: Home / Self Care | Payer: Medicare Other | Source: Ambulatory Visit | Attending: Podiatry | Admitting: Podiatry

## 2016-10-06 DIAGNOSIS — N189 Chronic kidney disease, unspecified: Secondary | ICD-10-CM | POA: Diagnosis not present

## 2016-10-06 DIAGNOSIS — Z79899 Other long term (current) drug therapy: Secondary | ICD-10-CM | POA: Diagnosis not present

## 2016-10-06 DIAGNOSIS — M869 Osteomyelitis, unspecified: Secondary | ICD-10-CM | POA: Diagnosis not present

## 2016-10-06 DIAGNOSIS — Z8673 Personal history of transient ischemic attack (TIA), and cerebral infarction without residual deficits: Secondary | ICD-10-CM | POA: Diagnosis not present

## 2016-10-06 DIAGNOSIS — Z794 Long term (current) use of insulin: Secondary | ICD-10-CM | POA: Diagnosis not present

## 2016-10-06 DIAGNOSIS — I2510 Atherosclerotic heart disease of native coronary artery without angina pectoris: Secondary | ICD-10-CM | POA: Diagnosis not present

## 2016-10-06 DIAGNOSIS — Z950 Presence of cardiac pacemaker: Secondary | ICD-10-CM | POA: Diagnosis not present

## 2016-10-06 DIAGNOSIS — M109 Gout, unspecified: Secondary | ICD-10-CM

## 2016-10-06 DIAGNOSIS — E11621 Type 2 diabetes mellitus with foot ulcer: Secondary | ICD-10-CM | POA: Diagnosis not present

## 2016-10-06 DIAGNOSIS — I255 Ischemic cardiomyopathy: Secondary | ICD-10-CM | POA: Diagnosis not present

## 2016-10-06 DIAGNOSIS — I252 Old myocardial infarction: Secondary | ICD-10-CM | POA: Diagnosis not present

## 2016-10-06 DIAGNOSIS — Z951 Presence of aortocoronary bypass graft: Secondary | ICD-10-CM | POA: Diagnosis not present

## 2016-10-06 DIAGNOSIS — I5042 Chronic combined systolic (congestive) and diastolic (congestive) heart failure: Secondary | ICD-10-CM | POA: Diagnosis not present

## 2016-10-06 DIAGNOSIS — L97513 Non-pressure chronic ulcer of other part of right foot with necrosis of muscle: Secondary | ICD-10-CM | POA: Diagnosis not present

## 2016-10-06 DIAGNOSIS — E1122 Type 2 diabetes mellitus with diabetic chronic kidney disease: Secondary | ICD-10-CM | POA: Diagnosis not present

## 2016-10-06 DIAGNOSIS — I130 Hypertensive heart and chronic kidney disease with heart failure and stage 1 through stage 4 chronic kidney disease, or unspecified chronic kidney disease: Secondary | ICD-10-CM | POA: Diagnosis not present

## 2016-10-06 DIAGNOSIS — I96 Gangrene, not elsewhere classified: Secondary | ICD-10-CM | POA: Diagnosis not present

## 2016-10-06 DIAGNOSIS — E1152 Type 2 diabetes mellitus with diabetic peripheral angiopathy with gangrene: Secondary | ICD-10-CM | POA: Diagnosis not present

## 2016-10-06 DIAGNOSIS — E1169 Type 2 diabetes mellitus with other specified complication: Secondary | ICD-10-CM | POA: Diagnosis not present

## 2016-10-06 HISTORY — DX: Gout, unspecified: M10.9

## 2016-10-06 HISTORY — DX: Acute myocardial infarction, unspecified: I21.9

## 2016-10-06 HISTORY — DX: Peripheral vascular disease, unspecified: I73.9

## 2016-10-06 HISTORY — DX: Gangrene, not elsewhere classified: I96

## 2016-10-06 HISTORY — DX: Anemia, unspecified: D64.9

## 2016-10-06 LAB — BASIC METABOLIC PANEL
Anion gap: 8 (ref 5–15)
BUN: 43 mg/dL — AB (ref 6–20)
CHLORIDE: 103 mmol/L (ref 101–111)
CO2: 29 mmol/L (ref 22–32)
CREATININE: 1.7 mg/dL — AB (ref 0.61–1.24)
Calcium: 9.6 mg/dL (ref 8.9–10.3)
GFR calc Af Amer: 52 mL/min — ABNORMAL LOW (ref >60)
GFR calc non Af Amer: 45 mL/min — ABNORMAL LOW (ref >60)
GLUCOSE: 138 mg/dL — AB (ref 65–99)
Potassium: 4.1 mmol/L (ref 3.5–5.1)
Sodium: 140 mmol/L (ref 135–145)

## 2016-10-06 NOTE — Patient Instructions (Signed)
  Your procedure is scheduled on: October 09, 2016 (FRIDAY) Report to Same Day Surgery 2nd floor medical mall ([facility] Entrance-take elevator on left to 2nd floor.  Check in with surgery information desk.) To find out your arrival time please call [phone] between 1PM - 3PM on October 08, 2016 (THURSDAY)   Remember: Instructions that are not followed completely may result in serious medical risk, up to and including death, or upon the discretion of your surgeon and anesthesiologist your surgery may need to be rescheduled.    _x___ 1. Do not eat food or drink liquids after midnight. No gum chewing or  hard candies                            __x__ 2. No Alcohol for 24 hours before or after surgery.   __x__3. No Smoking for 24 prior to surgery.   ____  4. Bring all medications with you on the day of surgery if instructed.    __x__ 5. Notify your doctor if there is any change in your medical condition     (cold, fever, infections).     Do not wear jewelry, make-up, hairpins, clips or nail polish.  Do not wear lotions, powders, or perfumes.  Do not shave 48 hours prior to surgery. Men may shave face and neck.  Do not bring valuables to the hospital.    [facility] is not responsible for any belongings or valuables.               Contacts, dentures or bridgework may not be worn into surgery.  Leave your suitcase in the car. After surgery it may be brought to your room.  For patients admitted to the hospital, discharge time is determined by your treatment team                    Patients discharged the day of surgery will not be allowed to drive home.  You will need someone to drive you home and stay with you the night of your procedure.    Please read over the following fact sheets that you were given:   [facility] Preparing for Surgery and or MRSA Information   _x___ Take the following medication with a sip of water the morning of surgery: .  1. CARVEDILOL  ____Fleets enema or  Magnesium Citrate as directed.   _x___ Use CHG Soap or sage wipes as directed on instruction sheet   ____ Use inhalers on the day of surgery and bring to hospital day of surgery  ____ Stop Metformin and Janumet 2 days prior to surgery.    _x___ Take 1/2 of usual insulin dose the night before surgery and none on the morning surgey   (NO INSULIN THE MORNING OF SURGERY )   _x___ Follow recommendations from Cardiologist, Pulmonologist or PCP regarding          stopping Aspirin, Coumadin, Plavix ,Eliquis, Effient, or Pradaxa, and Pletal.  X____Stop Anti-inflammatories such as Advil, Aleve, Ibuprofen, Motrin, Naproxen, Naprosyn, Goodies powders or aspirin products. OK to take Tylenol   _x___ Stop supplements until after surgery.  But may continue Vitamin D, Vitamin B,and multivitamin       ____ Bring C-Pap to the hospital.

## 2016-10-06 NOTE — Pre-Procedure Instructions (Signed)
Met B results sent to Dr. Vickki Muff and Anesthesia for review.

## 2016-10-07 DIAGNOSIS — I2510 Atherosclerotic heart disease of native coronary artery without angina pectoris: Secondary | ICD-10-CM | POA: Diagnosis not present

## 2016-10-07 DIAGNOSIS — Z452 Encounter for adjustment and management of vascular access device: Secondary | ICD-10-CM | POA: Diagnosis not present

## 2016-10-07 DIAGNOSIS — M869 Osteomyelitis, unspecified: Secondary | ICD-10-CM | POA: Diagnosis not present

## 2016-10-07 DIAGNOSIS — Z4781 Encounter for orthopedic aftercare following surgical amputation: Secondary | ICD-10-CM | POA: Diagnosis not present

## 2016-10-07 DIAGNOSIS — Z48812 Encounter for surgical aftercare following surgery on the circulatory system: Secondary | ICD-10-CM | POA: Diagnosis not present

## 2016-10-07 DIAGNOSIS — E1152 Type 2 diabetes mellitus with diabetic peripheral angiopathy with gangrene: Secondary | ICD-10-CM | POA: Diagnosis not present

## 2016-10-07 NOTE — Telephone Encounter (Signed)
Giving medical records clearance paperwork to fax to Dr. Alvera Singh office. Clearance states: Intermediate risk for an intermediate risk procedure.  Wound continue all of his heart failure medications through this hospital stay.  Place him on telemetry for at least 24 hours after his operation.

## 2016-10-08 MED ORDER — CEFAZOLIN SODIUM-DEXTROSE 2-4 GM/100ML-% IV SOLN
2.0000 g | INTRAVENOUS | Status: AC
  Administered 2016-10-09: 3 g via INTRAVENOUS

## 2016-10-09 ENCOUNTER — Ambulatory Visit: Payer: Medicare Other | Admitting: Anesthesiology

## 2016-10-09 ENCOUNTER — Ambulatory Visit
Admission: RE | Admit: 2016-10-09 | Discharge: 2016-10-09 | Disposition: A | Discharge status: Home / Self Care | Payer: Medicare Other | Source: Ambulatory Visit | Attending: Podiatry | Admitting: Podiatry

## 2016-10-09 ENCOUNTER — Encounter
Admission: RE | Disposition: A | Discharge status: Home / Self Care | Payer: Self-pay | Source: Ambulatory Visit | Attending: Podiatry

## 2016-10-09 DIAGNOSIS — L97513 Non-pressure chronic ulcer of other part of right foot with necrosis of muscle: Secondary | ICD-10-CM | POA: Diagnosis not present

## 2016-10-09 DIAGNOSIS — E1169 Type 2 diabetes mellitus with other specified complication: Secondary | ICD-10-CM | POA: Insufficient documentation

## 2016-10-09 DIAGNOSIS — I2510 Atherosclerotic heart disease of native coronary artery without angina pectoris: Secondary | ICD-10-CM | POA: Diagnosis not present

## 2016-10-09 DIAGNOSIS — I96 Gangrene, not elsewhere classified: Secondary | ICD-10-CM | POA: Insufficient documentation

## 2016-10-09 DIAGNOSIS — N189 Chronic kidney disease, unspecified: Secondary | ICD-10-CM | POA: Insufficient documentation

## 2016-10-09 DIAGNOSIS — E1122 Type 2 diabetes mellitus with diabetic chronic kidney disease: Secondary | ICD-10-CM | POA: Insufficient documentation

## 2016-10-09 DIAGNOSIS — I10 Essential (primary) hypertension: Secondary | ICD-10-CM | POA: Diagnosis not present

## 2016-10-09 DIAGNOSIS — M869 Osteomyelitis, unspecified: Secondary | ICD-10-CM | POA: Insufficient documentation

## 2016-10-09 DIAGNOSIS — I5042 Chronic combined systolic (congestive) and diastolic (congestive) heart failure: Secondary | ICD-10-CM | POA: Insufficient documentation

## 2016-10-09 DIAGNOSIS — Z951 Presence of aortocoronary bypass graft: Secondary | ICD-10-CM | POA: Insufficient documentation

## 2016-10-09 DIAGNOSIS — E1152 Type 2 diabetes mellitus with diabetic peripheral angiopathy with gangrene: Secondary | ICD-10-CM | POA: Diagnosis not present

## 2016-10-09 DIAGNOSIS — E11621 Type 2 diabetes mellitus with foot ulcer: Secondary | ICD-10-CM | POA: Insufficient documentation

## 2016-10-09 DIAGNOSIS — I255 Ischemic cardiomyopathy: Secondary | ICD-10-CM | POA: Insufficient documentation

## 2016-10-09 DIAGNOSIS — E119 Type 2 diabetes mellitus without complications: Secondary | ICD-10-CM | POA: Diagnosis not present

## 2016-10-09 DIAGNOSIS — Z794 Long term (current) use of insulin: Secondary | ICD-10-CM | POA: Insufficient documentation

## 2016-10-09 DIAGNOSIS — I252 Old myocardial infarction: Secondary | ICD-10-CM | POA: Insufficient documentation

## 2016-10-09 DIAGNOSIS — Z8673 Personal history of transient ischemic attack (TIA), and cerebral infarction without residual deficits: Secondary | ICD-10-CM | POA: Insufficient documentation

## 2016-10-09 DIAGNOSIS — Z79899 Other long term (current) drug therapy: Secondary | ICD-10-CM | POA: Insufficient documentation

## 2016-10-09 DIAGNOSIS — I130 Hypertensive heart and chronic kidney disease with heart failure and stage 1 through stage 4 chronic kidney disease, or unspecified chronic kidney disease: Secondary | ICD-10-CM | POA: Insufficient documentation

## 2016-10-09 DIAGNOSIS — Z950 Presence of cardiac pacemaker: Secondary | ICD-10-CM | POA: Insufficient documentation

## 2016-10-09 HISTORY — PX: AMPUTATION TOE: SHX6595

## 2016-10-09 HISTORY — PX: APLIGRAFT PLACEMENT: SHX5228

## 2016-10-09 HISTORY — PX: WOUND DEBRIDEMENT: SHX247

## 2016-10-09 LAB — GLUCOSE, CAPILLARY
Glucose-Capillary: 104 mg/dL — ABNORMAL HIGH (ref 65–99)
Glucose-Capillary: 108 mg/dL — ABNORMAL HIGH (ref 65–99)

## 2016-10-09 SURGERY — AMPUTATION, TOE
Anesthesia: General | Site: Foot | Laterality: Right | Wound class: Dirty or Infected

## 2016-10-09 MED ORDER — BUPIVACAINE HCL (PF) 0.5 % IJ SOLN
INTRAMUSCULAR | Status: AC
  Filled 2016-10-09: qty 30

## 2016-10-09 MED ORDER — GLYCOPYRROLATE 0.2 MG/ML IJ SOLN
INTRAMUSCULAR | Status: AC
  Filled 2016-10-09: qty 1

## 2016-10-09 MED ORDER — SODIUM CHLORIDE 0.9 % IV SOLN
INTRAVENOUS | Status: DC
  Administered 2016-10-09: 07:00:00 via INTRAVENOUS

## 2016-10-09 MED ORDER — HYDROCODONE-ACETAMINOPHEN 5-325 MG PO TABS: 1.0000 | ORAL_TABLET | ORAL | Status: DC | PRN

## 2016-10-09 MED ORDER — LIDOCAINE HCL (PF) 1 % IJ SOLN
INTRAMUSCULAR | Status: AC
  Filled 2016-10-09: qty 30

## 2016-10-09 MED ORDER — FENTANYL CITRATE (PF) 100 MCG/2ML IJ SOLN
INTRAMUSCULAR | Status: AC
  Administered 2016-10-09: 25 ug via INTRAVENOUS
  Filled 2016-10-09: qty 2

## 2016-10-09 MED ORDER — ONDANSETRON HCL 4 MG/2ML IJ SOLN: 4.0000 mg | Freq: Once | INTRAMUSCULAR | Status: DC | PRN

## 2016-10-09 MED ORDER — LIDOCAINE-EPINEPHRINE 1 %-1:100000 IJ SOLN
INTRAMUSCULAR | Status: DC | PRN
  Administered 2016-10-09: 4 mL

## 2016-10-09 MED ORDER — MIDAZOLAM HCL 2 MG/2ML IJ SOLN
INTRAMUSCULAR | Status: DC | PRN
  Administered 2016-10-09: 2 mg via INTRAVENOUS

## 2016-10-09 MED ORDER — ONDANSETRON HCL 4 MG/2ML IJ SOLN: 4.0000 mg | Freq: Four times a day (QID) | INTRAMUSCULAR | Status: DC | PRN

## 2016-10-09 MED ORDER — ONDANSETRON HCL 4 MG/2ML IJ SOLN
INTRAMUSCULAR | Status: AC
  Filled 2016-10-09: qty 2

## 2016-10-09 MED ORDER — MIDAZOLAM HCL 2 MG/2ML IJ SOLN
INTRAMUSCULAR | Status: AC
  Filled 2016-10-09: qty 2

## 2016-10-09 MED ORDER — FENTANYL CITRATE (PF) 100 MCG/2ML IJ SOLN
INTRAMUSCULAR | Status: AC
  Filled 2016-10-09: qty 2

## 2016-10-09 MED ORDER — HYDROCODONE-ACETAMINOPHEN 5-325 MG PO TABS: 1.0000 | ORAL_TABLET | Freq: Four times a day (QID) | ORAL | 0 refills | Status: DC | PRN

## 2016-10-09 MED ORDER — GLYCOPYRROLATE 0.2 MG/ML IJ SOLN
INTRAMUSCULAR | Status: DC | PRN
  Administered 2016-10-09: .15 mg via INTRAVENOUS

## 2016-10-09 MED ORDER — CEFAZOLIN SODIUM 1 G IJ SOLR
INTRAMUSCULAR | Status: AC
Start: 2016-10-09 — End: ?
  Filled 2016-10-09: qty 10

## 2016-10-09 MED ORDER — ONDANSETRON HCL 4 MG/2ML IJ SOLN
INTRAMUSCULAR | Status: DC | PRN
  Administered 2016-10-09: 4 mg via INTRAVENOUS

## 2016-10-09 MED ORDER — FENTANYL CITRATE (PF) 100 MCG/2ML IJ SOLN
INTRAMUSCULAR | Status: DC | PRN
  Administered 2016-10-09 (×2): 25 ug via INTRAVENOUS

## 2016-10-09 MED ORDER — DEXAMETHASONE SODIUM PHOSPHATE 10 MG/ML IJ SOLN
INTRAMUSCULAR | Status: DC | PRN
  Administered 2016-10-09: 10 mg via INTRAVENOUS

## 2016-10-09 MED ORDER — LIDOCAINE HCL (PF) 2 % IJ SOLN
INTRAMUSCULAR | Status: AC
Start: 2016-10-09 — End: ?
  Filled 2016-10-09: qty 2

## 2016-10-09 MED ORDER — FENTANYL CITRATE (PF) 100 MCG/2ML IJ SOLN
25.0000 ug | INTRAMUSCULAR | Status: DC | PRN
  Administered 2016-10-09 (×4): 25 ug via INTRAVENOUS

## 2016-10-09 MED ORDER — PHENYLEPHRINE HCL 10 MG/ML IJ SOLN
INTRAMUSCULAR | Status: DC | PRN
  Administered 2016-10-09: 100 ug via INTRAVENOUS

## 2016-10-09 MED ORDER — ONDANSETRON HCL 4 MG PO TABS: 4.0000 mg | ORAL_TABLET | Freq: Four times a day (QID) | ORAL | Status: DC | PRN

## 2016-10-09 MED ORDER — POVIDONE-IODINE 7.5 % EX SOLN
Freq: Once | CUTANEOUS | Status: DC
  Filled 2016-10-09: qty 118

## 2016-10-09 MED ORDER — FAMOTIDINE 20 MG PO TABS
20.0000 mg | ORAL_TABLET | Freq: Once | ORAL | Status: AC
  Administered 2016-10-09: 20 mg via ORAL

## 2016-10-09 MED ORDER — PROPOFOL 10 MG/ML IV BOLUS
INTRAVENOUS | Status: AC
  Filled 2016-10-09: qty 20

## 2016-10-09 MED ORDER — FAMOTIDINE 20 MG PO TABS
ORAL_TABLET | ORAL | Status: AC
  Administered 2016-10-09: 20 mg via ORAL
  Filled 2016-10-09: qty 1

## 2016-10-09 MED ORDER — DEXAMETHASONE SODIUM PHOSPHATE 10 MG/ML IJ SOLN
INTRAMUSCULAR | Status: AC
  Filled 2016-10-09: qty 1

## 2016-10-09 MED ORDER — LIDOCAINE-EPINEPHRINE 1 %-1:100000 IJ SOLN
INTRAMUSCULAR | Status: AC
  Filled 2016-10-09: qty 1

## 2016-10-09 SURGICAL SUPPLY — 70 items
APLIGRAF (Prosthesis & Implant Plastic) ×2 IMPLANT
BANDAGE ACE 4X5 VEL STRL LF (GAUZE/BANDAGES/DRESSINGS) ×2 IMPLANT
BANDAGE ELASTIC 4 LF NS (GAUZE/BANDAGES/DRESSINGS) ×2 IMPLANT
BANDAGE STRETCH 3X4.1 STRL (GAUZE/BANDAGES/DRESSINGS) ×4 IMPLANT
BLADE OSC/SAGITTAL MD 5.5X18 (BLADE) ×2 IMPLANT
BLADE OSCILLATING/SAGITTAL (BLADE)
BLADE SURG MINI STRL (BLADE) ×2 IMPLANT
BLADE SW THK.38XMED LNG THN (BLADE) IMPLANT
BNDG COHESIVE 4X5 TAN STRL (GAUZE/BANDAGES/DRESSINGS) IMPLANT
BNDG COHESIVE 6X5 TAN STRL LF (GAUZE/BANDAGES/DRESSINGS) IMPLANT
BNDG ESMARK 4X12 TAN STRL LF (GAUZE/BANDAGES/DRESSINGS) ×2 IMPLANT
BNDG GAUZE 4.5X4.1 6PLY STRL (MISCELLANEOUS) ×2 IMPLANT
CANISTER SUCT 1200ML W/VALVE (MISCELLANEOUS) ×2 IMPLANT
CANISTER SUCT 3000ML PPV (MISCELLANEOUS) ×2 IMPLANT
CUFF TOURN 18 STER (MISCELLANEOUS) ×2 IMPLANT
CUFF TOURN DUAL PL 12 NO SLV (MISCELLANEOUS) IMPLANT
DECANTER SPIKE VIAL GLASS SM (MISCELLANEOUS) ×2 IMPLANT
DRAPE FLUOR MINI C-ARM 54X84 (DRAPES) ×2 IMPLANT
DRAPE XRAY CASSETTE 23X24 (DRAPES) IMPLANT
DRESSING ALLEVYN 4X4 (MISCELLANEOUS) IMPLANT
DRSG MEPITEL 4X7.2 (GAUZE/BANDAGES/DRESSINGS) ×2 IMPLANT
DURAPREP 26ML APPLICATOR (WOUND CARE) ×2 IMPLANT
ELECT REM PT RETURN 9FT ADLT (ELECTROSURGICAL) ×2
ELECTRODE REM PT RTRN 9FT ADLT (ELECTROSURGICAL) ×1 IMPLANT
GAUZE PACKING 1/4 X5 YD (GAUZE/BANDAGES/DRESSINGS) IMPLANT
GAUZE PACKING IODOFORM 1/2 (PACKING) IMPLANT
GAUZE PACKING IODOFORM 1X5 (MISCELLANEOUS) IMPLANT
GAUZE PETRO XEROFOAM 1X8 (MISCELLANEOUS) IMPLANT
GAUZE SPONGE 4X4 12PLY STRL (GAUZE/BANDAGES/DRESSINGS) ×2 IMPLANT
GAUZE STRETCH 2X75IN STRL (MISCELLANEOUS) ×2 IMPLANT
GLOVE BIO SURGEON STRL SZ7.5 (GLOVE) ×2 IMPLANT
GLOVE INDICATOR 8.0 STRL GRN (GLOVE) ×2 IMPLANT
GOWN STRL REUS W/ TWL LRG LVL3 (GOWN DISPOSABLE) ×2 IMPLANT
GOWN STRL REUS W/TWL LRG LVL3 (GOWN DISPOSABLE) ×2
GOWN STRL REUS W/TWL MED LVL3 (GOWN DISPOSABLE) ×4 IMPLANT
GRAFT APLIGRAF (Prosthesis & Implant Plastic) ×1 IMPLANT
HANDPIECE INTERPULSE COAX TIP (DISPOSABLE)
HANDPIECE VERSAJET DEBRIDEMENT (MISCELLANEOUS) IMPLANT
IV NS 1000ML (IV SOLUTION) ×1
IV NS 1000ML BAXH (IV SOLUTION) ×1 IMPLANT
KIT RM TURNOVER STRD PROC AR (KITS) ×2 IMPLANT
LABEL OR SOLS (LABEL) ×2 IMPLANT
NEEDLE FILTER BLUNT 18X 1/2SAF (NEEDLE)
NEEDLE FILTER BLUNT 18X1 1/2 (NEEDLE) IMPLANT
NEEDLE HYPO 25X1 1.5 SAFETY (NEEDLE) ×2 IMPLANT
NS IRRIG 500ML POUR BTL (IV SOLUTION) ×2 IMPLANT
PACK EXTREMITY ARMC (MISCELLANEOUS) ×2 IMPLANT
PAD ABD DERMACEA PRESS 5X9 (GAUZE/BANDAGES/DRESSINGS) IMPLANT
RASP SM TEAR CROSS CUT (RASP) IMPLANT
SET HNDPC FAN SPRY TIP SCT (DISPOSABLE) IMPLANT
SOL .9 NS 3000ML IRR  AL (IV SOLUTION)
SOL .9 NS 3000ML IRR UROMATIC (IV SOLUTION) IMPLANT
SOL PREP PVP 2OZ (MISCELLANEOUS) ×2
SOLUTION PREP PVP 2OZ (MISCELLANEOUS) ×1 IMPLANT
STOCKINETTE IMPERVIOUS 9X36 MD (GAUZE/BANDAGES/DRESSINGS) ×2 IMPLANT
STOCKINETTE M/LG 89821 (MISCELLANEOUS) ×2 IMPLANT
STRAP SAFETY BODY (MISCELLANEOUS) ×2 IMPLANT
SUT ETHILON 2 0 FS 18 (SUTURE) ×2 IMPLANT
SUT ETHILON 3-0 FS-10 30 BLK (SUTURE) ×4
SUT ETHILON 4-0 (SUTURE)
SUT ETHILON 4-0 FS2 18XMFL BLK (SUTURE)
SUT ETHILON 5-0 FS-2 18 BLK (SUTURE) ×2 IMPLANT
SUT VIC AB 3-0 SH 27 (SUTURE)
SUT VIC AB 3-0 SH 27X BRD (SUTURE) IMPLANT
SUT VIC AB 4-0 FS2 27 (SUTURE) IMPLANT
SUTURE EHLN 3-0 FS-10 30 BLK (SUTURE) ×2 IMPLANT
SUTURE ETHLN 4-0 FS2 18XMF BLK (SUTURE) IMPLANT
SWAB CULTURE AMIES ANAERIB BLU (MISCELLANEOUS) IMPLANT
SYR 3ML LL SCALE MARK (SYRINGE) IMPLANT
SYRINGE 10CC LL (SYRINGE) IMPLANT

## 2016-10-09 NOTE — OR Nursing (Signed)
Patient arrived to [facility] for procedure with wound vac in place.

## 2016-10-09 NOTE — Anesthesia Preprocedure Evaluation (Signed)
Anesthesia Evaluation  Patient identified by MRN, date of birth, ID band Patient awake    Reviewed: Allergy & Precautions, NPO status , Patient's Chart, lab work & pertinent test results  History of Anesthesia Complications Negative for: history of anesthetic complications  Airway Mallampati: II       Dental   Pulmonary sleep apnea ,           Cardiovascular hypertension, Pt. on medications and Pt. on home beta blockers + CAD, + Past MI, + Peripheral Vascular Disease and +CHF  + pacemaker + Cardiac Defibrillator      Neuro/Psych CVA, No Residual Symptoms    GI/Hepatic negative GI ROS, Neg liver ROS,   Endo/Other  diabetes, Type 2, Insulin Dependent  Renal/GU Renal InsufficiencyRenal disease     Musculoskeletal   Abdominal   Peds  Hematology  (+) anemia ,   Anesthesia Other Findings   Reproductive/Obstetrics                            Anesthesia Physical Anesthesia Plan  ASA: III  Anesthesia Plan: General   Post-op Pain Management:    Induction: Intravenous  PONV Risk Score and Plan: 2 and Treatment may vary due to age, Propofol and Midazolam  Airway Management Planned: Nasal Cannula  Additional Equipment:   Intra-op Plan:   Post-operative Plan:   Informed Consent: I have reviewed the patients History and Physical, chart, labs and discussed the procedure including the risks, benefits and alternatives for the proposed anesthesia with the patient or authorized representative who has indicated his/her understanding and acceptance.     Plan Discussed with:   Anesthesia Plan Comments:         Anesthesia Quick Evaluation

## 2016-10-09 NOTE — Anesthesia Post-op Follow-up Note (Cosign Needed)
Anesthesia QCDR form completed.        

## 2016-10-09 NOTE — Transfer of Care (Signed)
Immediate Anesthesia Transfer of Care Note  Patient: Paul [name].  Procedure(s) Performed: Procedure(s): AMPUTATION TOE-RIGHT 2ND MPJ (Right) DEBRIDEMENT WOUND (Right) APLIGRAFT PLACEMENT (Right)  Patient Location: PACU  Anesthesia Type:General  Level of Consciousness: oriented and drowsy  Airway & Oxygen Therapy: Patient Spontanous Breathing and Patient connected to face mask oxygen  Post-op Assessment: Report given to RN and Post -op Vital signs reviewed and stable  Post vital signs: Reviewed and stable  Last Vitals:  Vitals:   10/09/16 0905 10/09/16 0908  BP:    Pulse:    Resp:    Temp: (P) 36.6 C 36.6 C    Last Pain:  Vitals:   10/09/16 0908  TempSrc: Temporal  PainSc:      Past Surgical History:  Procedure Laterality Date  . AMPUTATION TOE Right 08/22/2016   Procedure: AMPUTATION TOE;  Surgeon: Samara Deist, DPM;  Location: [facility] ORS;  Service: Podiatry;  Laterality: Right;  . CARDIAC DEFIBRILLATOR PLACEMENT  2011  . CAROTID ENDARTERECTOMY Left   . CHOLECYSTECTOMY  2010  . CORONARY ARTERY BYPASS GRAFT  2010   CABG x 3   . HIP SURGERY Right 1994  . INCISION AND DRAINAGE OF WOUND Right 08/22/2016   Procedure: IRRIGATION AND DEBRIDEMENT WOUND and wound vac placement;  Surgeon: Samara Deist, DPM;  Location: [facility] ORS;  Service: Podiatry;  Laterality: Right;  . LOWER EXTREMITY ANGIOGRAPHY Right 08/24/2016   Procedure: Lower Extremity Angiography;  Surgeon: Algernon Huxley, MD;  Location: [facility] LAB;  Service: Cardiovascular;  Laterality: Right;  . MASS EXCISION Right 07/18/2014   Procedure: EXCISION HETEROTOPIC BONE RIGHT HIP;  Surgeon: Frederik Pear, MD;  Location: [facility];  Service: Orthopedics;  Laterality: Right;  . Open Heart Surgery  2010   x 3   Past Medical History:  Diagnosis Date  . AICD (automatic cardioverter/defibrillator) present   . Anemia   . Arteriosclerotic cardiovascular disease (ASCVD)   . Cardiac defibrillator in place   . CHF  (congestive heart failure) (HCC)    takes Torsemide daily  . CHF (congestive heart failure) ([place])   . Coronary artery disease   . DDD (degenerative disc disease), lumbosacral    L5-S1  . Diabetes ([place])    Lantus at bedtime  . Gangrene of toe of right foot ([place])   . Gout of left hand 10/06/2016  . Hypertension    takes Coreg daily  . Myocardial infarction ([place]) 2009  . Peripheral vascular disease ([place])   . Sleep apnea    sleep study yr ago-unable to afford cpap  . Stroke [facility]) 09   no weakness   No current facility-administered medications on file prior to encounter.    Current Outpatient Prescriptions on File Prior to Encounter  Medication Sig Dispense Refill  . carvedilol (COREG) 12.5 MG tablet Take 12.5 mg by mouth daily.     . colchicine 0.6 MG tablet Take 1 tablet (0.6 mg total) by mouth daily as needed. (Patient taking differently: Take 0.6 mg by mouth daily as needed (for gout flares). ) 30 tablet 0  . insulin glargine (LANTUS) 100 UNIT/ML injection Inject 24 Units into the skin daily.     Marland Kitchen spironolactone (ALDACTONE) 25 MG tablet Take 25 mg by mouth daily.    Marland Kitchen torsemide (DEMADEX) 20 MG tablet Take 1 tablet (20 mg total) by mouth 2 (two) times daily. 120 tablet 4  . acidophilus (RISAQUAD) CAPS capsule Take 1 capsule by mouth daily. (Patient not taking: Reported on  10/02/2016) 20 capsule 0  . oxyCODONE (OXY IR/ROXICODONE) 5 MG immediate release tablet Take 1 tablet (5 mg total) by mouth every 6 (six) hours as needed for moderate pain. (Patient not taking: Reported on 10/02/2016) 20 tablet 0  . piperacillin-tazobactam (ZOSYN) 4-0.5 GM/100ML SOLN IVPB Inject 100 mLs (4.5 g total) into the vein every 8 (eight) hours. Until 09/17/16 (Patient not taking: Reported on 10/02/2016) 3000 mL 0        Complications: No apparent anesthesia complications

## 2016-10-09 NOTE — Anesthesia Procedure Notes (Signed)
Procedure Name: LMA Insertion Date/Time: 10/09/2016 8:23 AM Performed by: Kennon Holter Pre-anesthesia Checklist: Patient identified, Patient being monitored, Timeout performed, Emergency Drugs available and Suction available Patient Re-evaluated:Patient Re-evaluated prior to inductionOxygen Delivery Method: Circle system utilized Preoxygenation: Pre-oxygenation with 100% oxygen Intubation Type: IV induction Ventilation: Mask ventilation without difficulty LMA: LMA inserted LMA Size: 5.0 Tube type: Oral Number of attempts: 1 Placement Confirmation: positive ETCO2 and breath sounds checked- equal and bilateral Tube secured with: Tape Dental Injury: Teeth and Oropharynx as per pre-operative assessment

## 2016-10-09 NOTE — Discharge Instructions (Addendum)
°  DO NOT BEAR ANY WEIGHT ON YOUR OPERATIVE FOOT - USE YOUR WALKER AT HOME KEEP OPERATIVE FOOT/LEG ELEVATED ON TWO PILLOWS AT REST KEEP DRESSING CLEAN AND DRY - DO NOT REMOVE       AMBULATORY SURGERY  DISCHARGE INSTRUCTIONS   1) The drugs that you were given will stay in your system until tomorrow so for the next 24 hours you should not:  A) Drive an automobile B) Make any legal decisions C) Drink any alcoholic beverage   2) You may resume regular meals tomorrow.  Today it is better to start with liquids and gradually work up to solid foods.  You may eat anything you prefer, but it is better to start with liquids, then soup and crackers, and gradually work up to solid foods.   3) Please notify your doctor immediately if you have any unusual bleeding, trouble breathing, redness and pain at the surgery site, drainage, fever, or pain not relieved by medication.    4) Additional Instructions:        Please contact your physician with any problems or Same Day Surgery at [phone], Monday through Friday 6 am to 4 pm, or [place] at [facility] number at [phone].

## 2016-10-09 NOTE — Op Note (Signed)
Operative note   Surgeon:Shaney Deckman    Assistant:none     Preop diagnosis:1. gangene right 5th toe 2.  Diabetic ulcer to the deep tissue and muscle right forefoot    Postop diagnosis: Same    Procedure: 1. Right fifth toe and metatarsal head resection 2. Excisional debridement with versa jet to muscle right foot 3. Placement of Apligraf right foot    EBL: 10 ML's    Anesthesia:local and general    Hemostasis: None    Specimen: Gangrenous right fifth toe    Complications: None    Operative indications:Paul [name]. is an 51 y.o. that presents today for surgical intervention.  The risks/benefits/alternatives/complications have been discussed and consent has been given.    Procedure:  Patient was brought into the OR and placed on the operating table in thesupine position. After anesthesia was obtained theright lower extremity was prepped and draped in usual sterile fashion.  Attention was directed to the right fifth toe where the gangrenous changes were noted to the base of the proximal phalanx region. Full-thickness incision was taken down to bone. The toe was then disarticulated at the MTPJ. The head of the metatarsal was exposed at this time. X-ray revealed prominence of the head of metatarsal with previous excision of fourth and third metatarsal heads. At this time the decision was made to remove the distal fifth metatarsal head to remove this prominent region. With a power saw this was then removed at the surgical neck region in line with the third and fourth metatarsal head excisions. The deeper tissue and large 7 x 3 cm ulceration down to muscle was noted just lateral to the amputation site. Excisional debridement was then performed with a versa jet down to healthy bleeding tissue into the muscle region. All bleeding was Bovie cauterized postoperatively. At this time a an Apligraf was then placed overlying the wound. This was placed deep into the wound with good into contact to  the bed of the ulcerative site. This was stabilized with Steri-Strips surrounding the wound. The small skin flap that was created from the fifth toe amputation site had been tacked down to the ulcerative region and surrounding skin with a 3-0 nylon. A large bulky sterile dressing was applied. Mepitel was used to cover the Apligraf and sterile gauze and bulky dressings were applied overlying this.    Patient tolerated the procedure and anesthesia well.  Was transported from the OR to the PACU with all vital signs stable and vascular status intact. To be discharged per routine protocol.  Will follow up in approximately 1 week in the outpatient clinic.

## 2016-10-09 NOTE — H&P (Signed)
HISTORY AND PHYSICAL INTERVAL NOTE:  10/09/2016  7:48 AM  Paul Ala.  has presented today for surgery, with the diagnosis of Skin ulcer right foot with necrosis of muscle  L97.513.  The various methods of treatment have been discussed with the patient.  No guarantees were given.  After consideration of risks, benefits and other options for treatment, the patient has consented to surgery.  I have reviewed the patients' chart and labs.    Patient Vitals for the past 24 hrs:  BP Temp Temp src Pulse Resp SpO2 Weight  10/09/16 0613 (!) 143/97 97.5 F (36.4 C) Tympanic 83 16 100 % 115.7 kg (255 lb)    A history and physical examination was performed in my office.  The patient was reexamined.  There have been no changes to this history and physical examination.  Samara Deist A

## 2016-10-09 NOTE — Anesthesia Postprocedure Evaluation (Signed)
Anesthesia Post Note  Patient: Paul [name].  Procedure(s) Performed: Procedure(s) (LRB): AMPUTATION TOE-RIGHT 2ND MPJ (Right) DEBRIDEMENT WOUND (Right) APLIGRAFT PLACEMENT (Right)  Patient location during evaluation: PACU Anesthesia Type: General Level of consciousness: awake and alert Pain management: pain level controlled Vital Signs Assessment: post-procedure vital signs reviewed and stable Respiratory status: spontaneous breathing and respiratory function stable Cardiovascular status: stable Anesthetic complications: no     Last Vitals:  Vitals:   10/09/16 0905 10/09/16 0908  BP: 118/75   Pulse: 72   Resp: 11   Temp: 36.6 C 36.6 C    Last Pain:  Vitals:   10/09/16 0908  TempSrc: Temporal  PainSc:                  Rache Klimaszewski K

## 2016-10-12 DIAGNOSIS — M869 Osteomyelitis, unspecified: Secondary | ICD-10-CM | POA: Diagnosis not present

## 2016-10-12 DIAGNOSIS — Z452 Encounter for adjustment and management of vascular access device: Secondary | ICD-10-CM | POA: Diagnosis not present

## 2016-10-12 DIAGNOSIS — Z4781 Encounter for orthopedic aftercare following surgical amputation: Secondary | ICD-10-CM | POA: Diagnosis not present

## 2016-10-12 DIAGNOSIS — E1152 Type 2 diabetes mellitus with diabetic peripheral angiopathy with gangrene: Secondary | ICD-10-CM | POA: Diagnosis not present

## 2016-10-12 DIAGNOSIS — Z48812 Encounter for surgical aftercare following surgery on the circulatory system: Secondary | ICD-10-CM | POA: Diagnosis not present

## 2016-10-12 DIAGNOSIS — I2510 Atherosclerotic heart disease of native coronary artery without angina pectoris: Secondary | ICD-10-CM | POA: Diagnosis not present

## 2016-10-12 LAB — SURGICAL PATHOLOGY

## 2016-10-14 DIAGNOSIS — M869 Osteomyelitis, unspecified: Secondary | ICD-10-CM | POA: Diagnosis not present

## 2016-10-14 DIAGNOSIS — Z48812 Encounter for surgical aftercare following surgery on the circulatory system: Secondary | ICD-10-CM | POA: Diagnosis not present

## 2016-10-14 DIAGNOSIS — E1152 Type 2 diabetes mellitus with diabetic peripheral angiopathy with gangrene: Secondary | ICD-10-CM | POA: Diagnosis not present

## 2016-10-14 DIAGNOSIS — I2510 Atherosclerotic heart disease of native coronary artery without angina pectoris: Secondary | ICD-10-CM | POA: Diagnosis not present

## 2016-10-14 DIAGNOSIS — Z4781 Encounter for orthopedic aftercare following surgical amputation: Secondary | ICD-10-CM | POA: Diagnosis not present

## 2016-10-14 DIAGNOSIS — Z452 Encounter for adjustment and management of vascular access device: Secondary | ICD-10-CM | POA: Diagnosis not present

## 2016-10-16 DIAGNOSIS — Z4781 Encounter for orthopedic aftercare following surgical amputation: Secondary | ICD-10-CM | POA: Diagnosis not present

## 2016-10-16 DIAGNOSIS — Z452 Encounter for adjustment and management of vascular access device: Secondary | ICD-10-CM | POA: Diagnosis not present

## 2016-10-16 DIAGNOSIS — I2510 Atherosclerotic heart disease of native coronary artery without angina pectoris: Secondary | ICD-10-CM | POA: Diagnosis not present

## 2016-10-16 DIAGNOSIS — Z48812 Encounter for surgical aftercare following surgery on the circulatory system: Secondary | ICD-10-CM | POA: Diagnosis not present

## 2016-10-16 DIAGNOSIS — E1152 Type 2 diabetes mellitus with diabetic peripheral angiopathy with gangrene: Secondary | ICD-10-CM | POA: Diagnosis not present

## 2016-10-16 DIAGNOSIS — M869 Osteomyelitis, unspecified: Secondary | ICD-10-CM | POA: Diagnosis not present

## 2016-10-19 DIAGNOSIS — Z48812 Encounter for surgical aftercare following surgery on the circulatory system: Secondary | ICD-10-CM | POA: Diagnosis not present

## 2016-10-19 DIAGNOSIS — Z452 Encounter for adjustment and management of vascular access device: Secondary | ICD-10-CM | POA: Diagnosis not present

## 2016-10-19 DIAGNOSIS — Z4781 Encounter for orthopedic aftercare following surgical amputation: Secondary | ICD-10-CM | POA: Diagnosis not present

## 2016-10-19 DIAGNOSIS — M869 Osteomyelitis, unspecified: Secondary | ICD-10-CM | POA: Diagnosis not present

## 2016-10-19 DIAGNOSIS — E1152 Type 2 diabetes mellitus with diabetic peripheral angiopathy with gangrene: Secondary | ICD-10-CM | POA: Diagnosis not present

## 2016-10-19 DIAGNOSIS — I2510 Atherosclerotic heart disease of native coronary artery without angina pectoris: Secondary | ICD-10-CM | POA: Diagnosis not present

## 2016-10-21 ENCOUNTER — Institutional Professional Consult (permissible substitution): Payer: Medicare Other | Admitting: Internal Medicine

## 2016-10-21 ENCOUNTER — Encounter: Payer: Self-pay | Admitting: Nurse Practitioner

## 2016-10-21 ENCOUNTER — Ambulatory Visit (INDEPENDENT_AMBULATORY_CARE_PROVIDER_SITE_OTHER): Payer: Medicare Other | Admitting: Nurse Practitioner

## 2016-10-21 VITALS — BP 110/80 | HR 84 | Ht 70.0 in | Wt 241.5 lb

## 2016-10-21 DIAGNOSIS — E1152 Type 2 diabetes mellitus with diabetic peripheral angiopathy with gangrene: Secondary | ICD-10-CM | POA: Diagnosis not present

## 2016-10-21 DIAGNOSIS — I10 Essential (primary) hypertension: Secondary | ICD-10-CM | POA: Diagnosis not present

## 2016-10-21 DIAGNOSIS — I6529 Occlusion and stenosis of unspecified carotid artery: Secondary | ICD-10-CM

## 2016-10-21 DIAGNOSIS — Z452 Encounter for adjustment and management of vascular access device: Secondary | ICD-10-CM | POA: Diagnosis not present

## 2016-10-21 DIAGNOSIS — I5022 Chronic systolic (congestive) heart failure: Secondary | ICD-10-CM

## 2016-10-21 DIAGNOSIS — Z48812 Encounter for surgical aftercare following surgery on the circulatory system: Secondary | ICD-10-CM | POA: Diagnosis not present

## 2016-10-21 DIAGNOSIS — I2510 Atherosclerotic heart disease of native coronary artery without angina pectoris: Secondary | ICD-10-CM

## 2016-10-21 DIAGNOSIS — Z4781 Encounter for orthopedic aftercare following surgical amputation: Secondary | ICD-10-CM | POA: Diagnosis not present

## 2016-10-21 DIAGNOSIS — M869 Osteomyelitis, unspecified: Secondary | ICD-10-CM | POA: Diagnosis not present

## 2016-10-21 MED ORDER — TORSEMIDE 20 MG PO TABS: 20.0000 mg | ORAL_TABLET | Freq: Every day | ORAL | 3 refills | Status: DC

## 2016-10-21 MED ORDER — ATORVASTATIN CALCIUM 40 MG PO TABS: 40.0000 mg | ORAL_TABLET | Freq: Every day | ORAL | 6 refills | Status: DC

## 2016-10-21 MED ORDER — ASPIRIN EC 81 MG PO TBEC: 81.0000 mg | DELAYED_RELEASE_TABLET | Freq: Every day | ORAL | 3 refills | Status: DC

## 2016-10-21 NOTE — Progress Notes (Signed)
Office Visit    Patient Name: Paul [name]. Date of Encounter: 10/21/2016  Primary Care Provider:  Seward Carol, MD Primary Cardiologist:  Formerly A. Yvone Neu, MD - he will f/u w/ T. Rockey Situ, MD   Chief Complaint    51 year old male with a history of CAD, ischemic myopathy, systolic heart failure, hypertension, hyperlipidemia, carotid arterial disease status post left internal carotid artery stenting, sleep apnea, diabetes, and poor wound healing of the right foot requiring toe amputations, who presents for follow-up.  Past Medical History    Past Medical History:  Diagnosis Date  . Anemia   . Cardiac defibrillator in place    a. Biotronik LUmax 540 DRT, (ser # [id number]).  . Carotid arterial disease ([place])    a. s/p prior LICA stenting;  b. 05/173 Carotid U/S: 40-59% bilat ICA stenosis. Patent LICA stent.  . CKD (chronic kidney disease), stage III   . Coronary artery disease    a. 2010 s/p CABG x 3.  . DDD (degenerative disc disease), lumbosacral    L5-S1  . Diabetes ([place])    Lantus at bedtime  . Gangrene of toe of right foot ([place])   . Gout of left hand 10/06/2016  . HFrEF (heart failure with reduced ejection fraction) ([place])    a. 07/2016 Echo: EF 25-30%, diff HK, mild MR, mildly dil LA, mod reduced RV fxn, PASP 69mmHg.  Marland Kitchen Hypertension    takes Coreg daily  . Ischemic cardiomyopathy    a. 07/2016 Echo: EF 25-30%, diff HK.  . Myocardial infarction ([place]) 2009  . Peripheral vascular disease ([place])   . Sleep apnea    sleep study yr ago-unable to afford cpap  . Stroke [facility]) 09   no weakness   Past Surgical History:  Procedure Laterality Date  . AMPUTATION TOE Right 08/22/2016   Procedure: AMPUTATION TOE;  Surgeon: Samara Deist, DPM;  Location: [facility] ORS;  Service: Podiatry;  Laterality: Right;  . AMPUTATION TOE Right 10/09/2016   Procedure: AMPUTATION TOE-RIGHT 2ND MPJ;  Surgeon: Samara Deist, DPM;  Location: [facility] ORS;  Service: Podiatry;  Laterality: Right;  .  APLIGRAFT PLACEMENT Right 10/09/2016   Procedure: APLIGRAFT PLACEMENT;  Surgeon: Samara Deist, DPM;  Location: [facility] ORS;  Service: Podiatry;  Laterality: Right;  . CARDIAC DEFIBRILLATOR PLACEMENT  2011  . CAROTID ENDARTERECTOMY Left   . CHOLECYSTECTOMY  2010  . CORONARY ARTERY BYPASS GRAFT  2010   CABG x 3   . HIP SURGERY Right 1994  . INCISION AND DRAINAGE OF WOUND Right 08/22/2016   Procedure: IRRIGATION AND DEBRIDEMENT WOUND and wound vac placement;  Surgeon: Samara Deist, DPM;  Location: [facility] ORS;  Service: Podiatry;  Laterality: Right;  . LOWER EXTREMITY ANGIOGRAPHY Right 08/24/2016   Procedure: Lower Extremity Angiography;  Surgeon: Algernon Huxley, MD;  Location: [place] [facility];  Service: Cardiovascular;  Laterality: Right;  . MASS EXCISION Right 07/18/2014   Procedure: EXCISION HETEROTOPIC BONE RIGHT HIP;  Surgeon: Frederik Pear, MD;  Location: [place];  Service: Orthopedics;  Laterality: Right;  . Open Heart Surgery  2010   x 3  . WOUND DEBRIDEMENT Right 10/09/2016   Procedure: DEBRIDEMENT WOUND;  Surgeon: Samara Deist, DPM;  Location: [facility] OR[facility];  Service: Podiatry;  Laterality: Right;    Allergies  Allergies  Allergen Reactions  . Ibuprofen Other (See Comments)    Heart problems    History of Present Illness    51 year old male with the above complex past medical history including  coronary artery disease status post three-vessel bypass in 2010. He also has a history of systolic heart failure and ischemic cardiomyopathy and is status post ICD placement. Other history includes hypertension, hyperlipidemia, diabetes, sleep apnea, obesity, stroke, stage 3-4 chronic kidney disease, and carotid arterial disease status post left internal carotid artery stenting. He was last seen in clinic in March. Since that time, he has been stable from a cardiac standpoint. He has been watching his diet and has cut out sugared soft drinks and in that setting, has lost a fair amount of weight. He has  chronic, stable dyspnea exertion but has not been having any issues with chest pain, palpitations, PND, orthopnea, dizziness, syncope, edema, or early satiety. In the setting of weight loss, he has cut back his torsemide from 20 mg twice a day down to 20 mg daily.  In April, he was admitted with gangrene of the toes of his right foot. He required outpatient antibiotics and subsequently has had amputations of 2 of his toes on the right foot. He currently has a wound VAC in place and says he will need a third procedure in the near future. He tolerated these procedures well.   Home Medications    Prior to Admission medications   Medication Sig Start Date End Date Taking? Authorizing Provider  acidophilus (RISAQUAD) CAPS capsule Take 1 capsule by mouth daily. 08/27/16  Yes Gladstone Lighter, MD  carvedilol (COREG) 12.5 MG tablet Take 12.5 mg by mouth daily.    Yes [provider]  colchicine 0.6 MG tablet Take 1 tablet (0.6 mg total) by mouth daily as needed. Patient taking differently: Take 0.6 mg by mouth daily as needed (for gout flares).  08/27/16  Yes Gladstone Lighter, MD  insulin glargine (LANTUS) 100 UNIT/ML injection Inject 24 Units into the skin daily.    Yes [provider]  spironolactone (ALDACTONE) 25 MG tablet Take 25 mg by mouth daily.   Yes [provider]  torsemide (DEMADEX) 20 MG tablet Take 1 tablet (20 mg total) by mouth daily. 10/21/16  Yes Rogelia Mire, NP  aspirin EC 81 MG tablet Take 1 tablet (81 mg total) by mouth daily. 10/21/16   Rogelia Mire, NP    Review of Systems    As above, his chronic dyspnea exertion. His ambulation is only slightly limited right now in the setting of recent toe amputations. He denies chest pain, PND, orthopnea, dizziness, syncope, edema, or early satiety.  All other systems reviewed and are otherwise negative except as noted above.  Physical Exam    VS:  BP 110/80 (BP Location: Left Arm, Patient  Position: Sitting, Cuff Size: Normal)   Pulse 84   Ht 5\' 10"  (1.778 m)   Wt 241 lb 8 oz (109.5 kg)   BMI 34.65 kg/m  , BMI Body mass index is 34.65 kg/m. GEN: Well nourished, well developed, in no acute distress.  HEENT: normal.  Neck: Supple, no JVD, carotid bruits, or masses. Cardiac: RRR, no murmurs, rubs, or gallops. No clubbing, cyanosis, edema.  Radials/DP/PT 2+ and equal bilaterally.  Respiratory:  Respirations regular and unlabored, clear to auscultation bilaterally. GI: Soft, nontender, nondistended, BS + x 4. MS: no deformity or atrophy. Skin: warm and dry, no rash. Neuro:  Strength and sensation are intact. Psych: Normal affect.  Accessory Clinical Findings    ECG - Likely ectopic atrial rhythm, 84, PVCs, left axis deviation, IVCD no acute ST or T changes. Leads assessed and found to be  placed in the correct positions.  Assessment & Plan    1.  HFrEF/ischemic cardiomyopathy: Patient has been stable from a heart failure standpoint. His weight has been coming down in the setting of more attention being paid to caloric intake. He has been able to reduce his home torsemide dose to 20 mg daily and has not had issues with orthopnea, edema, early satiety, or change in baseline dyspnea. He remains on beta blocker, torsemide, and spironolactone therapy. He is not on an ACE inhibitor/ARB/ARNI the setting of stage 3-4 chronic kidney disease.  2. Essential hypertension: Blood pressure is stable on current therapy.  3. Coronary artery disease status post prior CABG. He has not been having any chest pain. He remains on beta blocker therapy. He is not currently on an aspirin or statin. He says he was on these before but at the time didn't have money consistently came off of them. I've asked him to begin taking aspirin 81 mg daily and I will send a prescription for Lipitor 40 mg daily. We do not currently have a lipid panel on file and we will need to arrange for follow-up lipids and LFTs in  about 6 weeks.  4. Lipid status: Currently unknown. Adding statin as above. Follow-up lipids and LFTs in 6 weeks.  5. Carotid arterial disease: Stable bilateral disease by recent ultrasound. Planned follow-up in one year.  6. Obesity: Patient is actively trying to lose weight. We talked about increasing activity and caloric restrictions. Activity is somewhat limited right now in the setting of ongoing foot surgeries in the setting of gangrene on the right.  7. Gangrene of right foot: Being closely followed by podiatry. He is status post 2 surgeries/amputations on the right. He says he is scheduled for 1 additional surgery.  8. Type 2 diabetes mellitus: He is on Lantus and reports compliance. Is trying to watch sugar in his diet. This is followed by primary care.  9. Disposition: Follow-up lipids and LFTs in 6 weeks. Follow-up in clinic in 3 months.  Murray Hodgkins, NP 10/21/2016, 4:08 PM

## 2016-10-21 NOTE — Patient Instructions (Signed)
Medication Instructions:  Please start aspirin 81 mg once daily Your med list now shows that you take torsemide 20 mg once daily  Labwork: None  Testing/Procedures: None  Follow-Up: 1 month w/ Dr. Fletcher Anon  If you need a refill on your cardiac medications before your next appointment, please call your pharmacy.

## 2016-10-23 DIAGNOSIS — Z4781 Encounter for orthopedic aftercare following surgical amputation: Secondary | ICD-10-CM | POA: Diagnosis not present

## 2016-10-23 DIAGNOSIS — M869 Osteomyelitis, unspecified: Secondary | ICD-10-CM | POA: Diagnosis not present

## 2016-10-23 DIAGNOSIS — E7800 Pure hypercholesterolemia, unspecified: Secondary | ICD-10-CM | POA: Diagnosis not present

## 2016-10-23 DIAGNOSIS — Z452 Encounter for adjustment and management of vascular access device: Secondary | ICD-10-CM | POA: Diagnosis not present

## 2016-10-23 DIAGNOSIS — Z794 Long term (current) use of insulin: Secondary | ICD-10-CM | POA: Diagnosis not present

## 2016-10-23 DIAGNOSIS — Z48812 Encounter for surgical aftercare following surgery on the circulatory system: Secondary | ICD-10-CM | POA: Diagnosis not present

## 2016-10-23 DIAGNOSIS — I2510 Atherosclerotic heart disease of native coronary artery without angina pectoris: Secondary | ICD-10-CM | POA: Diagnosis not present

## 2016-10-23 DIAGNOSIS — E1165 Type 2 diabetes mellitus with hyperglycemia: Secondary | ICD-10-CM | POA: Diagnosis not present

## 2016-10-23 DIAGNOSIS — E1151 Type 2 diabetes mellitus with diabetic peripheral angiopathy without gangrene: Secondary | ICD-10-CM | POA: Diagnosis not present

## 2016-10-23 DIAGNOSIS — E1152 Type 2 diabetes mellitus with diabetic peripheral angiopathy with gangrene: Secondary | ICD-10-CM | POA: Diagnosis not present

## 2016-10-23 DIAGNOSIS — I509 Heart failure, unspecified: Secondary | ICD-10-CM | POA: Diagnosis not present

## 2016-10-23 DIAGNOSIS — I10 Essential (primary) hypertension: Secondary | ICD-10-CM | POA: Diagnosis not present

## 2016-10-23 DIAGNOSIS — E1122 Type 2 diabetes mellitus with diabetic chronic kidney disease: Secondary | ICD-10-CM | POA: Diagnosis not present

## 2016-10-23 DIAGNOSIS — M109 Gout, unspecified: Secondary | ICD-10-CM | POA: Diagnosis not present

## 2016-10-23 DIAGNOSIS — N183 Chronic kidney disease, stage 3 (moderate): Secondary | ICD-10-CM | POA: Diagnosis not present

## 2016-10-23 DIAGNOSIS — I739 Peripheral vascular disease, unspecified: Secondary | ICD-10-CM | POA: Diagnosis not present

## 2016-10-26 DIAGNOSIS — I5022 Chronic systolic (congestive) heart failure: Secondary | ICD-10-CM | POA: Diagnosis not present

## 2016-10-26 DIAGNOSIS — I130 Hypertensive heart and chronic kidney disease with heart failure and stage 1 through stage 4 chronic kidney disease, or unspecified chronic kidney disease: Secondary | ICD-10-CM | POA: Diagnosis not present

## 2016-10-26 DIAGNOSIS — I2510 Atherosclerotic heart disease of native coronary artery without angina pectoris: Secondary | ICD-10-CM | POA: Diagnosis not present

## 2016-10-26 DIAGNOSIS — Z4781 Encounter for orthopedic aftercare following surgical amputation: Secondary | ICD-10-CM | POA: Diagnosis not present

## 2016-10-26 DIAGNOSIS — E1152 Type 2 diabetes mellitus with diabetic peripheral angiopathy with gangrene: Secondary | ICD-10-CM | POA: Diagnosis not present

## 2016-10-26 DIAGNOSIS — Z48812 Encounter for surgical aftercare following surgery on the circulatory system: Secondary | ICD-10-CM | POA: Diagnosis not present

## 2016-10-28 DIAGNOSIS — E1152 Type 2 diabetes mellitus with diabetic peripheral angiopathy with gangrene: Secondary | ICD-10-CM | POA: Diagnosis not present

## 2016-10-28 DIAGNOSIS — I2510 Atherosclerotic heart disease of native coronary artery without angina pectoris: Secondary | ICD-10-CM | POA: Diagnosis not present

## 2016-10-28 DIAGNOSIS — I130 Hypertensive heart and chronic kidney disease with heart failure and stage 1 through stage 4 chronic kidney disease, or unspecified chronic kidney disease: Secondary | ICD-10-CM | POA: Diagnosis not present

## 2016-10-28 DIAGNOSIS — Z48812 Encounter for surgical aftercare following surgery on the circulatory system: Secondary | ICD-10-CM | POA: Diagnosis not present

## 2016-10-28 DIAGNOSIS — I5022 Chronic systolic (congestive) heart failure: Secondary | ICD-10-CM | POA: Diagnosis not present

## 2016-10-28 DIAGNOSIS — Z4781 Encounter for orthopedic aftercare following surgical amputation: Secondary | ICD-10-CM | POA: Diagnosis not present

## 2016-10-30 DIAGNOSIS — I5022 Chronic systolic (congestive) heart failure: Secondary | ICD-10-CM | POA: Diagnosis not present

## 2016-10-30 DIAGNOSIS — E1152 Type 2 diabetes mellitus with diabetic peripheral angiopathy with gangrene: Secondary | ICD-10-CM | POA: Diagnosis not present

## 2016-10-30 DIAGNOSIS — I2510 Atherosclerotic heart disease of native coronary artery without angina pectoris: Secondary | ICD-10-CM | POA: Diagnosis not present

## 2016-10-30 DIAGNOSIS — Z48812 Encounter for surgical aftercare following surgery on the circulatory system: Secondary | ICD-10-CM | POA: Diagnosis not present

## 2016-10-30 DIAGNOSIS — Z4781 Encounter for orthopedic aftercare following surgical amputation: Secondary | ICD-10-CM | POA: Diagnosis not present

## 2016-10-30 DIAGNOSIS — I130 Hypertensive heart and chronic kidney disease with heart failure and stage 1 through stage 4 chronic kidney disease, or unspecified chronic kidney disease: Secondary | ICD-10-CM | POA: Diagnosis not present

## 2016-11-02 DIAGNOSIS — I2510 Atherosclerotic heart disease of native coronary artery without angina pectoris: Secondary | ICD-10-CM | POA: Diagnosis not present

## 2016-11-02 DIAGNOSIS — Z48812 Encounter for surgical aftercare following surgery on the circulatory system: Secondary | ICD-10-CM | POA: Diagnosis not present

## 2016-11-02 DIAGNOSIS — I130 Hypertensive heart and chronic kidney disease with heart failure and stage 1 through stage 4 chronic kidney disease, or unspecified chronic kidney disease: Secondary | ICD-10-CM | POA: Diagnosis not present

## 2016-11-02 DIAGNOSIS — Z4781 Encounter for orthopedic aftercare following surgical amputation: Secondary | ICD-10-CM | POA: Diagnosis not present

## 2016-11-02 DIAGNOSIS — I5022 Chronic systolic (congestive) heart failure: Secondary | ICD-10-CM | POA: Diagnosis not present

## 2016-11-02 DIAGNOSIS — E1152 Type 2 diabetes mellitus with diabetic peripheral angiopathy with gangrene: Secondary | ICD-10-CM | POA: Diagnosis not present

## 2016-11-04 DIAGNOSIS — E1152 Type 2 diabetes mellitus with diabetic peripheral angiopathy with gangrene: Secondary | ICD-10-CM | POA: Diagnosis not present

## 2016-11-04 DIAGNOSIS — Z4781 Encounter for orthopedic aftercare following surgical amputation: Secondary | ICD-10-CM | POA: Diagnosis not present

## 2016-11-04 DIAGNOSIS — I130 Hypertensive heart and chronic kidney disease with heart failure and stage 1 through stage 4 chronic kidney disease, or unspecified chronic kidney disease: Secondary | ICD-10-CM | POA: Diagnosis not present

## 2016-11-04 DIAGNOSIS — I5022 Chronic systolic (congestive) heart failure: Secondary | ICD-10-CM | POA: Diagnosis not present

## 2016-11-04 DIAGNOSIS — Z48812 Encounter for surgical aftercare following surgery on the circulatory system: Secondary | ICD-10-CM | POA: Diagnosis not present

## 2016-11-04 DIAGNOSIS — I2510 Atherosclerotic heart disease of native coronary artery without angina pectoris: Secondary | ICD-10-CM | POA: Diagnosis not present

## 2016-11-06 DIAGNOSIS — Z48812 Encounter for surgical aftercare following surgery on the circulatory system: Secondary | ICD-10-CM | POA: Diagnosis not present

## 2016-11-06 DIAGNOSIS — Z4781 Encounter for orthopedic aftercare following surgical amputation: Secondary | ICD-10-CM | POA: Diagnosis not present

## 2016-11-06 DIAGNOSIS — I5022 Chronic systolic (congestive) heart failure: Secondary | ICD-10-CM | POA: Diagnosis not present

## 2016-11-06 DIAGNOSIS — E1152 Type 2 diabetes mellitus with diabetic peripheral angiopathy with gangrene: Secondary | ICD-10-CM | POA: Diagnosis not present

## 2016-11-06 DIAGNOSIS — I130 Hypertensive heart and chronic kidney disease with heart failure and stage 1 through stage 4 chronic kidney disease, or unspecified chronic kidney disease: Secondary | ICD-10-CM | POA: Diagnosis not present

## 2016-11-06 DIAGNOSIS — I2510 Atherosclerotic heart disease of native coronary artery without angina pectoris: Secondary | ICD-10-CM | POA: Diagnosis not present

## 2016-11-09 DIAGNOSIS — I2510 Atherosclerotic heart disease of native coronary artery without angina pectoris: Secondary | ICD-10-CM | POA: Diagnosis not present

## 2016-11-09 DIAGNOSIS — Z4781 Encounter for orthopedic aftercare following surgical amputation: Secondary | ICD-10-CM | POA: Diagnosis not present

## 2016-11-09 DIAGNOSIS — Z48812 Encounter for surgical aftercare following surgery on the circulatory system: Secondary | ICD-10-CM | POA: Diagnosis not present

## 2016-11-09 DIAGNOSIS — E1152 Type 2 diabetes mellitus with diabetic peripheral angiopathy with gangrene: Secondary | ICD-10-CM | POA: Diagnosis not present

## 2016-11-09 DIAGNOSIS — I130 Hypertensive heart and chronic kidney disease with heart failure and stage 1 through stage 4 chronic kidney disease, or unspecified chronic kidney disease: Secondary | ICD-10-CM | POA: Diagnosis not present

## 2016-11-09 DIAGNOSIS — I5022 Chronic systolic (congestive) heart failure: Secondary | ICD-10-CM | POA: Diagnosis not present

## 2016-11-11 DIAGNOSIS — I5022 Chronic systolic (congestive) heart failure: Secondary | ICD-10-CM | POA: Diagnosis not present

## 2016-11-11 DIAGNOSIS — E1152 Type 2 diabetes mellitus with diabetic peripheral angiopathy with gangrene: Secondary | ICD-10-CM | POA: Diagnosis not present

## 2016-11-11 DIAGNOSIS — I130 Hypertensive heart and chronic kidney disease with heart failure and stage 1 through stage 4 chronic kidney disease, or unspecified chronic kidney disease: Secondary | ICD-10-CM | POA: Diagnosis not present

## 2016-11-11 DIAGNOSIS — Z4781 Encounter for orthopedic aftercare following surgical amputation: Secondary | ICD-10-CM | POA: Diagnosis not present

## 2016-11-11 DIAGNOSIS — I2510 Atherosclerotic heart disease of native coronary artery without angina pectoris: Secondary | ICD-10-CM | POA: Diagnosis not present

## 2016-11-11 DIAGNOSIS — Z48812 Encounter for surgical aftercare following surgery on the circulatory system: Secondary | ICD-10-CM | POA: Diagnosis not present

## 2016-11-13 DIAGNOSIS — I5022 Chronic systolic (congestive) heart failure: Secondary | ICD-10-CM | POA: Diagnosis not present

## 2016-11-13 DIAGNOSIS — I130 Hypertensive heart and chronic kidney disease with heart failure and stage 1 through stage 4 chronic kidney disease, or unspecified chronic kidney disease: Secondary | ICD-10-CM | POA: Diagnosis not present

## 2016-11-13 DIAGNOSIS — Z48812 Encounter for surgical aftercare following surgery on the circulatory system: Secondary | ICD-10-CM | POA: Diagnosis not present

## 2016-11-13 DIAGNOSIS — E1152 Type 2 diabetes mellitus with diabetic peripheral angiopathy with gangrene: Secondary | ICD-10-CM | POA: Diagnosis not present

## 2016-11-13 DIAGNOSIS — Z4781 Encounter for orthopedic aftercare following surgical amputation: Secondary | ICD-10-CM | POA: Diagnosis not present

## 2016-11-13 DIAGNOSIS — I2510 Atherosclerotic heart disease of native coronary artery without angina pectoris: Secondary | ICD-10-CM | POA: Diagnosis not present

## 2016-11-16 DIAGNOSIS — I5022 Chronic systolic (congestive) heart failure: Secondary | ICD-10-CM | POA: Diagnosis not present

## 2016-11-16 DIAGNOSIS — I2510 Atherosclerotic heart disease of native coronary artery without angina pectoris: Secondary | ICD-10-CM | POA: Diagnosis not present

## 2016-11-16 DIAGNOSIS — Z4781 Encounter for orthopedic aftercare following surgical amputation: Secondary | ICD-10-CM | POA: Diagnosis not present

## 2016-11-16 DIAGNOSIS — E1152 Type 2 diabetes mellitus with diabetic peripheral angiopathy with gangrene: Secondary | ICD-10-CM | POA: Diagnosis not present

## 2016-11-16 DIAGNOSIS — I130 Hypertensive heart and chronic kidney disease with heart failure and stage 1 through stage 4 chronic kidney disease, or unspecified chronic kidney disease: Secondary | ICD-10-CM | POA: Diagnosis not present

## 2016-11-16 DIAGNOSIS — Z48812 Encounter for surgical aftercare following surgery on the circulatory system: Secondary | ICD-10-CM | POA: Diagnosis not present

## 2016-11-18 DIAGNOSIS — I130 Hypertensive heart and chronic kidney disease with heart failure and stage 1 through stage 4 chronic kidney disease, or unspecified chronic kidney disease: Secondary | ICD-10-CM | POA: Diagnosis not present

## 2016-11-18 DIAGNOSIS — E1152 Type 2 diabetes mellitus with diabetic peripheral angiopathy with gangrene: Secondary | ICD-10-CM | POA: Diagnosis not present

## 2016-11-18 DIAGNOSIS — Z48812 Encounter for surgical aftercare following surgery on the circulatory system: Secondary | ICD-10-CM | POA: Diagnosis not present

## 2016-11-18 DIAGNOSIS — Z4781 Encounter for orthopedic aftercare following surgical amputation: Secondary | ICD-10-CM | POA: Diagnosis not present

## 2016-11-18 DIAGNOSIS — I2510 Atherosclerotic heart disease of native coronary artery without angina pectoris: Secondary | ICD-10-CM | POA: Diagnosis not present

## 2016-11-18 DIAGNOSIS — I5022 Chronic systolic (congestive) heart failure: Secondary | ICD-10-CM | POA: Diagnosis not present

## 2016-11-20 ENCOUNTER — Ambulatory Visit: Payer: Medicare Other | Admitting: Cardiovascular Disease

## 2016-11-20 DIAGNOSIS — I2510 Atherosclerotic heart disease of native coronary artery without angina pectoris: Secondary | ICD-10-CM | POA: Diagnosis not present

## 2016-11-20 DIAGNOSIS — I5022 Chronic systolic (congestive) heart failure: Secondary | ICD-10-CM | POA: Diagnosis not present

## 2016-11-20 DIAGNOSIS — I130 Hypertensive heart and chronic kidney disease with heart failure and stage 1 through stage 4 chronic kidney disease, or unspecified chronic kidney disease: Secondary | ICD-10-CM | POA: Diagnosis not present

## 2016-11-20 DIAGNOSIS — Z48812 Encounter for surgical aftercare following surgery on the circulatory system: Secondary | ICD-10-CM | POA: Diagnosis not present

## 2016-11-20 DIAGNOSIS — Z4781 Encounter for orthopedic aftercare following surgical amputation: Secondary | ICD-10-CM | POA: Diagnosis not present

## 2016-11-20 DIAGNOSIS — E1152 Type 2 diabetes mellitus with diabetic peripheral angiopathy with gangrene: Secondary | ICD-10-CM | POA: Diagnosis not present

## 2016-11-23 DIAGNOSIS — I130 Hypertensive heart and chronic kidney disease with heart failure and stage 1 through stage 4 chronic kidney disease, or unspecified chronic kidney disease: Secondary | ICD-10-CM | POA: Diagnosis not present

## 2016-11-23 DIAGNOSIS — I2510 Atherosclerotic heart disease of native coronary artery without angina pectoris: Secondary | ICD-10-CM | POA: Diagnosis not present

## 2016-11-23 DIAGNOSIS — I5022 Chronic systolic (congestive) heart failure: Secondary | ICD-10-CM | POA: Diagnosis not present

## 2016-11-23 DIAGNOSIS — Z4781 Encounter for orthopedic aftercare following surgical amputation: Secondary | ICD-10-CM | POA: Diagnosis not present

## 2016-11-23 DIAGNOSIS — Z48812 Encounter for surgical aftercare following surgery on the circulatory system: Secondary | ICD-10-CM | POA: Diagnosis not present

## 2016-11-23 DIAGNOSIS — E1152 Type 2 diabetes mellitus with diabetic peripheral angiopathy with gangrene: Secondary | ICD-10-CM | POA: Diagnosis not present

## 2016-11-25 DIAGNOSIS — Z48812 Encounter for surgical aftercare following surgery on the circulatory system: Secondary | ICD-10-CM | POA: Diagnosis not present

## 2016-11-25 DIAGNOSIS — I130 Hypertensive heart and chronic kidney disease with heart failure and stage 1 through stage 4 chronic kidney disease, or unspecified chronic kidney disease: Secondary | ICD-10-CM | POA: Diagnosis not present

## 2016-11-25 DIAGNOSIS — E1152 Type 2 diabetes mellitus with diabetic peripheral angiopathy with gangrene: Secondary | ICD-10-CM | POA: Diagnosis not present

## 2016-11-25 DIAGNOSIS — I5022 Chronic systolic (congestive) heart failure: Secondary | ICD-10-CM | POA: Diagnosis not present

## 2016-11-25 DIAGNOSIS — I2510 Atherosclerotic heart disease of native coronary artery without angina pectoris: Secondary | ICD-10-CM | POA: Diagnosis not present

## 2016-11-25 DIAGNOSIS — Z4781 Encounter for orthopedic aftercare following surgical amputation: Secondary | ICD-10-CM | POA: Diagnosis not present

## 2016-11-27 DIAGNOSIS — E1152 Type 2 diabetes mellitus with diabetic peripheral angiopathy with gangrene: Secondary | ICD-10-CM | POA: Diagnosis not present

## 2016-11-27 DIAGNOSIS — Z4781 Encounter for orthopedic aftercare following surgical amputation: Secondary | ICD-10-CM | POA: Diagnosis not present

## 2016-11-27 DIAGNOSIS — Z48812 Encounter for surgical aftercare following surgery on the circulatory system: Secondary | ICD-10-CM | POA: Diagnosis not present

## 2016-11-27 DIAGNOSIS — I2510 Atherosclerotic heart disease of native coronary artery without angina pectoris: Secondary | ICD-10-CM | POA: Diagnosis not present

## 2016-11-27 DIAGNOSIS — I130 Hypertensive heart and chronic kidney disease with heart failure and stage 1 through stage 4 chronic kidney disease, or unspecified chronic kidney disease: Secondary | ICD-10-CM | POA: Diagnosis not present

## 2016-11-27 DIAGNOSIS — I5022 Chronic systolic (congestive) heart failure: Secondary | ICD-10-CM | POA: Diagnosis not present

## 2016-11-30 DIAGNOSIS — Z4781 Encounter for orthopedic aftercare following surgical amputation: Secondary | ICD-10-CM | POA: Diagnosis not present

## 2016-11-30 DIAGNOSIS — I2510 Atherosclerotic heart disease of native coronary artery without angina pectoris: Secondary | ICD-10-CM | POA: Diagnosis not present

## 2016-11-30 DIAGNOSIS — Z48812 Encounter for surgical aftercare following surgery on the circulatory system: Secondary | ICD-10-CM | POA: Diagnosis not present

## 2016-11-30 DIAGNOSIS — E1152 Type 2 diabetes mellitus with diabetic peripheral angiopathy with gangrene: Secondary | ICD-10-CM | POA: Diagnosis not present

## 2016-11-30 DIAGNOSIS — I5022 Chronic systolic (congestive) heart failure: Secondary | ICD-10-CM | POA: Diagnosis not present

## 2016-11-30 DIAGNOSIS — I130 Hypertensive heart and chronic kidney disease with heart failure and stage 1 through stage 4 chronic kidney disease, or unspecified chronic kidney disease: Secondary | ICD-10-CM | POA: Diagnosis not present

## 2016-12-02 DIAGNOSIS — I5022 Chronic systolic (congestive) heart failure: Secondary | ICD-10-CM | POA: Diagnosis not present

## 2016-12-02 DIAGNOSIS — Z4781 Encounter for orthopedic aftercare following surgical amputation: Secondary | ICD-10-CM | POA: Diagnosis not present

## 2016-12-02 DIAGNOSIS — I130 Hypertensive heart and chronic kidney disease with heart failure and stage 1 through stage 4 chronic kidney disease, or unspecified chronic kidney disease: Secondary | ICD-10-CM | POA: Diagnosis not present

## 2016-12-02 DIAGNOSIS — I2510 Atherosclerotic heart disease of native coronary artery without angina pectoris: Secondary | ICD-10-CM | POA: Diagnosis not present

## 2016-12-02 DIAGNOSIS — Z48812 Encounter for surgical aftercare following surgery on the circulatory system: Secondary | ICD-10-CM | POA: Diagnosis not present

## 2016-12-02 DIAGNOSIS — E1152 Type 2 diabetes mellitus with diabetic peripheral angiopathy with gangrene: Secondary | ICD-10-CM | POA: Diagnosis not present

## 2016-12-04 DIAGNOSIS — Z4781 Encounter for orthopedic aftercare following surgical amputation: Secondary | ICD-10-CM | POA: Diagnosis not present

## 2016-12-04 DIAGNOSIS — E1152 Type 2 diabetes mellitus with diabetic peripheral angiopathy with gangrene: Secondary | ICD-10-CM | POA: Diagnosis not present

## 2016-12-04 DIAGNOSIS — I130 Hypertensive heart and chronic kidney disease with heart failure and stage 1 through stage 4 chronic kidney disease, or unspecified chronic kidney disease: Secondary | ICD-10-CM | POA: Diagnosis not present

## 2016-12-04 DIAGNOSIS — I5022 Chronic systolic (congestive) heart failure: Secondary | ICD-10-CM | POA: Diagnosis not present

## 2016-12-04 DIAGNOSIS — Z48812 Encounter for surgical aftercare following surgery on the circulatory system: Secondary | ICD-10-CM | POA: Diagnosis not present

## 2016-12-04 DIAGNOSIS — I2510 Atherosclerotic heart disease of native coronary artery without angina pectoris: Secondary | ICD-10-CM | POA: Diagnosis not present

## 2016-12-07 DIAGNOSIS — Z48812 Encounter for surgical aftercare following surgery on the circulatory system: Secondary | ICD-10-CM | POA: Diagnosis not present

## 2016-12-07 DIAGNOSIS — I5022 Chronic systolic (congestive) heart failure: Secondary | ICD-10-CM | POA: Diagnosis not present

## 2016-12-07 DIAGNOSIS — Z4781 Encounter for orthopedic aftercare following surgical amputation: Secondary | ICD-10-CM | POA: Diagnosis not present

## 2016-12-07 DIAGNOSIS — I2510 Atherosclerotic heart disease of native coronary artery without angina pectoris: Secondary | ICD-10-CM | POA: Diagnosis not present

## 2016-12-07 DIAGNOSIS — I130 Hypertensive heart and chronic kidney disease with heart failure and stage 1 through stage 4 chronic kidney disease, or unspecified chronic kidney disease: Secondary | ICD-10-CM | POA: Diagnosis not present

## 2016-12-07 DIAGNOSIS — E1152 Type 2 diabetes mellitus with diabetic peripheral angiopathy with gangrene: Secondary | ICD-10-CM | POA: Diagnosis not present

## 2016-12-09 DIAGNOSIS — I5022 Chronic systolic (congestive) heart failure: Secondary | ICD-10-CM | POA: Diagnosis not present

## 2016-12-09 DIAGNOSIS — E1152 Type 2 diabetes mellitus with diabetic peripheral angiopathy with gangrene: Secondary | ICD-10-CM | POA: Diagnosis not present

## 2016-12-09 DIAGNOSIS — I2510 Atherosclerotic heart disease of native coronary artery without angina pectoris: Secondary | ICD-10-CM | POA: Diagnosis not present

## 2016-12-09 DIAGNOSIS — Z4781 Encounter for orthopedic aftercare following surgical amputation: Secondary | ICD-10-CM | POA: Diagnosis not present

## 2016-12-09 DIAGNOSIS — I130 Hypertensive heart and chronic kidney disease with heart failure and stage 1 through stage 4 chronic kidney disease, or unspecified chronic kidney disease: Secondary | ICD-10-CM | POA: Diagnosis not present

## 2016-12-09 DIAGNOSIS — Z48812 Encounter for surgical aftercare following surgery on the circulatory system: Secondary | ICD-10-CM | POA: Diagnosis not present

## 2016-12-11 DIAGNOSIS — I2510 Atherosclerotic heart disease of native coronary artery without angina pectoris: Secondary | ICD-10-CM | POA: Diagnosis not present

## 2016-12-11 DIAGNOSIS — E1152 Type 2 diabetes mellitus with diabetic peripheral angiopathy with gangrene: Secondary | ICD-10-CM | POA: Diagnosis not present

## 2016-12-11 DIAGNOSIS — I130 Hypertensive heart and chronic kidney disease with heart failure and stage 1 through stage 4 chronic kidney disease, or unspecified chronic kidney disease: Secondary | ICD-10-CM | POA: Diagnosis not present

## 2016-12-11 DIAGNOSIS — I5022 Chronic systolic (congestive) heart failure: Secondary | ICD-10-CM | POA: Diagnosis not present

## 2016-12-11 DIAGNOSIS — Z4781 Encounter for orthopedic aftercare following surgical amputation: Secondary | ICD-10-CM | POA: Diagnosis not present

## 2016-12-11 DIAGNOSIS — Z48812 Encounter for surgical aftercare following surgery on the circulatory system: Secondary | ICD-10-CM | POA: Diagnosis not present

## 2016-12-14 DIAGNOSIS — I130 Hypertensive heart and chronic kidney disease with heart failure and stage 1 through stage 4 chronic kidney disease, or unspecified chronic kidney disease: Secondary | ICD-10-CM | POA: Diagnosis not present

## 2016-12-14 DIAGNOSIS — I5022 Chronic systolic (congestive) heart failure: Secondary | ICD-10-CM | POA: Diagnosis not present

## 2016-12-14 DIAGNOSIS — Z48812 Encounter for surgical aftercare following surgery on the circulatory system: Secondary | ICD-10-CM | POA: Diagnosis not present

## 2016-12-14 DIAGNOSIS — E1152 Type 2 diabetes mellitus with diabetic peripheral angiopathy with gangrene: Secondary | ICD-10-CM | POA: Diagnosis not present

## 2016-12-14 DIAGNOSIS — Z4781 Encounter for orthopedic aftercare following surgical amputation: Secondary | ICD-10-CM | POA: Diagnosis not present

## 2016-12-14 DIAGNOSIS — I2510 Atherosclerotic heart disease of native coronary artery without angina pectoris: Secondary | ICD-10-CM | POA: Diagnosis not present

## 2016-12-15 ENCOUNTER — Encounter (INDEPENDENT_AMBULATORY_CARE_PROVIDER_SITE_OTHER): Payer: Self-pay | Admitting: Vascular Surgery

## 2016-12-15 ENCOUNTER — Encounter (INDEPENDENT_AMBULATORY_CARE_PROVIDER_SITE_OTHER): Payer: Self-pay

## 2016-12-15 ENCOUNTER — Ambulatory Visit (INDEPENDENT_AMBULATORY_CARE_PROVIDER_SITE_OTHER): Payer: Medicare Other | Admitting: Vascular Surgery

## 2016-12-15 VITALS — BP 116/72 | HR 82 | Resp 17 | Ht 71.0 in | Wt 250.0 lb

## 2016-12-15 DIAGNOSIS — E11621 Type 2 diabetes mellitus with foot ulcer: Secondary | ICD-10-CM | POA: Diagnosis not present

## 2016-12-15 DIAGNOSIS — L97511 Non-pressure chronic ulcer of other part of right foot limited to breakdown of skin: Secondary | ICD-10-CM | POA: Diagnosis not present

## 2016-12-15 DIAGNOSIS — I6529 Occlusion and stenosis of unspecified carotid artery: Secondary | ICD-10-CM | POA: Diagnosis not present

## 2016-12-15 DIAGNOSIS — N184 Chronic kidney disease, stage 4 (severe): Secondary | ICD-10-CM | POA: Insufficient documentation

## 2016-12-15 DIAGNOSIS — N183 Chronic kidney disease, stage 3 unspecified: Secondary | ICD-10-CM

## 2016-12-15 DIAGNOSIS — Z794 Long term (current) use of insulin: Secondary | ICD-10-CM

## 2016-12-15 DIAGNOSIS — I10 Essential (primary) hypertension: Secondary | ICD-10-CM

## 2016-12-15 DIAGNOSIS — L97509 Non-pressure chronic ulcer of other part of unspecified foot with unspecified severity: Secondary | ICD-10-CM | POA: Diagnosis not present

## 2016-12-15 NOTE — Progress Notes (Signed)
Patient ID: Paul Ala., male   DOB: [birth year], 51 y.o.   MRN: [id number]  Chief Complaint  Patient presents with  . New Patient (Initial Visit)    HPI Paul Ashland. is a 51 y.o. male.  I am asked to see the patient by Vickki Muff for evaluation of right foot wound and consideration for STSG.  The patient reports 3 toe amputations on the right foot about 5 or 6 months ago that have been very slow to heal. He has a persistent wound on the right foot which has failed a previous synthetic skin graft placement. He has had a VAC dressing on that foot that has not really worked very well now for a couple of months. The wound is really stalled out and needs some sort of therapy and since he is artery had failure of a synthetic skin graft, his podiatrist recommended he consider getting a split thickness skin graft. He denies any fevers or chills. No chest pain, shortness of breath, or other new issues. He has no current left leg symptoms. He was an area  Past Medical History:  Diagnosis Date  . Anemia   . Cardiac defibrillator in place    a. Biotronik LUmax 540 DRT, (ser # [id number]).  . Carotid arterial disease ([place])    a. s/p prior LICA stenting;  b. 01/9832 Carotid U/S: 40-59% bilat ICA stenosis. Patent LICA stent.  . CKD (chronic kidney disease), stage III   . Coronary artery disease    a. 2010 s/p CABG x 3.  . DDD (degenerative disc disease), lumbosacral    L5-S1  . Diabetes ([place])    Lantus at bedtime  . Gangrene of toe of right foot ([place])   . Gout of left hand 10/06/2016  . HFrEF (heart failure with reduced ejection fraction) ([place])    a. 07/2016 Echo: EF 25-30%, diff HK, mild MR, mildly dil LA, mod reduced RV fxn, PASP 88mHg.  .Marland KitchenHypertension    takes Coreg daily  . Ischemic cardiomyopathy    a. 07/2016 Echo: EF 25-30%, diff HK.  . [place]ocardial infarction ([place] 2009  . [place]ipheral vascular disease ([place]   . Sleep apnea    sleep study yr ago-unable to afford cpap  . Stroke ([facility]  09   no weakness    Past Surgical History:  Procedure Laterality Date  . AMPUTATION TOE Right 08/22/2016   Procedure: AMPUTATION TOE;  Surgeon: JSamara Deist DPM;  Location: [facility] ORS;  Service: Podiatry;  Laterality: Right;  . AMPUTATION TOE Right 10/09/2016   Procedure: AMPUTATION TOE-RIGHT 2ND MPJ;  Surgeon: FSamara Deist DPM;  Location: [facility] ORS;  Service: Podiatry;  Laterality: Right;  . APLIGRAFT PLACEMENT Right 10/09/2016   Procedure: APLIGRAFT PLACEMENT;  Surgeon: FSamara Deist DPM;  Location: [facility] OR[facility];  Service: Podiatry;  Laterality: Right;  . CARDIAC DEFIBRILLATOR PLACEMENT  2011  . CAROTID ENDARTERECTOMY Left   . CHOLECYSTECTOMY  2010  . CORONARY ARTERY BYPASS GRAFT  2010   CABG x 3   . HIP SURGERY Right 1994  . INCISION AND DRAINAGE OF WOUND Right 08/22/2016   Procedure: IRRIGATION AND DEBRIDEMENT WOUND and wound vac placement;  Surgeon: JSamara Deist DPM;  Location: [facility];  Service: Podiatry;  Laterality: Right;  . LOWER EXTREMITY ANGIOGRAPHY Right 08/24/2016   Procedure: Lower Extremity Angiography;  Surgeon: JAlgernon Huxley MD;  [facility];  Service: Cardiovascular;  Laterality: Right;  . MASS EXCISION Right 07/18/2014   Procedure:  EXCISION HETEROTOPIC BONE RIGHT HIP;  Surgeon: Frederik Pear, MD;  Location: [place];  Service: Orthopedics;  Laterality: Right;  . Open Heart Surgery  2010   x 3  . WOUND DEBRIDEMENT Right 10/09/2016   Procedure: DEBRIDEMENT WOUND;  Surgeon: Samara Deist, DPM;  Location: [facility];  Service: Podiatry;  Laterality: Right;    Family History  Problem Relation Age of Onset  . Hypertension Other   . Diabetes Other   . Diabetes Father   . Hyperlipidemia Father   . Hypertension Father   . Hyperlipidemia Sister   . Hypertension Sister   . Diabetes Sister   . Diabetes Brother   . Hypertension Brother   . Hyperlipidemia Brother     Social History Social History  Substance Use Topics  . Smoking status: Never Smoker  .  Smokeless tobacco: Never Used  . Alcohol use No  Works at the [employer]  . Ibuprofen Other (See Comments)    Heart problems    Current Outpatient Prescriptions  Medication Sig Dispense Refill  . aspirin EC 81 MG tablet Take 1 tablet (81 mg total) by mouth daily. 90 tablet 3  . carvedilol (COREG) 12.5 MG tablet Take 12.5 mg by mouth daily.     . colchicine 0.6 MG tablet Take 1 tablet (0.6 mg total) by mouth daily as needed. (Patient taking differently: Take 0.6 mg by mouth daily as needed (for gout flares). ) 30 tablet 0  . insulin glargine (LANTUS) 100 UNIT/ML injection Inject 24 Units into the skin every morning.     . torsemide (DEMADEX) 20 MG tablet Take 1 tablet (20 mg total) by mouth daily. 90 tablet 3  . spironolactone (ALDACTONE) 25 MG tablet Take 25 mg by mouth daily.     No current facility-administered medications for this visit.       REVIEW OF SYSTEMS (Negative unless checked)  Constitutional: [] Weight loss  [] Fever  [] Chills Cardiac: [] Chest pain   [] Chest pressure   [x] Palpitations   [] Shortness of breath when laying flat   [] Shortness of breath at rest   [x] Shortness of breath with exertion. Vascular:  [] Pain in legs with walking   [] Pain in legs at rest   [] Pain in legs when laying flat   [] Claudication   [] Pain in feet when walking  [] Pain in feet at rest  [] Pain in feet when laying flat   [] History of DVT   [] Phlebitis   [] Swelling in legs   [] Varicose veins   [x] Non-healing ulcers Pulmonary:   [] Uses home oxygen   [] Productive cough   [] Hemoptysis   [] Wheeze  [] COPD   [] Asthma Neurologic:  [] Dizziness  [] Blackouts   [] Seizures   [x] History of stroke   [] History of TIA  [] Aphasia   [] Temporary blindness   [] Dysphagia   [] Weakness or numbness in arms   [x] Weakness or numbness in legs Musculoskeletal:  [] Arthritis   [] Joint swelling   [] Joint pain   [] Low back pain Hematologic:  [] Easy bruising  [] Easy bleeding   [] Hypercoagulable  state   [] Anemic  [] Hepatitis Gastrointestinal:  [] Blood in stool   [] Vomiting blood  [] Gastroesophageal reflux/heartburn   [] Abdominal pain Genitourinary:  [x] Chronic kidney disease   [] Difficult urination  [] Frequent urination  [] Burning with urination   [] Hematuria Skin:  [] Rashes   [x] Ulcers   [x] Wounds Psychological:  [] History of anxiety   []  History of major depression.    Physical Exam BP 116/72   Pulse 82  Resp 17   Ht 5' 11"  (1.803 m)   Wt 250 lb (113.4 kg)   BMI 34.87 kg/m  Gen:  WD/WN, NAD Head: [place]/AT, No temporalis wasting. Ear/Nose/Throat: Hearing grossly intact, nares w/o erythema or drainage, oropharynx w/o Erythema/Exudate Eyes: Conjunctiva clear, sclera non-icteric  Neck: trachea midline.  No JVD.  Pulmonary:  Good air movement, clear to auscultation bilaterally.  Cardiac: irregularly irregular Vascular:  Vessel Right Left  Radial Palpable Palpable                          PT 1+ Palpable 1+ Palpable  DP Palpable Palpable   Gastrointestinal: soft, non-tender/non-distended.  Musculoskeletal: M/S 5/5 throughout.  Mild LE edema bilaterally. Foot is warm with good capillary refill. Irregularly shaped ulceration from digital amputation sites 3 and 4 on the right foot. This is somewhat diamond-appearing. There is pale granulation tissue with mild fibrinous exudate. No surrounding erythema or purulent discharge. Neurologic: Sensation grossly intact in extremities.  Symmetrical.  Speech is fluent. Motor exam as listed above. Psychiatric: Judgment intact, Mood & affect appropriate for pt's clinical situation. Dermatologic: Right foot wound as described above    Radiology No results found.  Labs Recent Results (from the past 2160 hour(s))  CUP PACEART REMOTE DEVICE CHECK     Status: None   Collection Time: 09/22/16  6:50 PM  Result Value Ref Range   Pulse Generator Manufacturer BITR    Date Time Interrogation Session [id number]    Pulse Gen Model Lumax  540 DR-T    Pulse Gen Serial Number [id number]    Clinic Name [facility]    Implantable Pulse Generator Type Implantable Cardiac Defibulator    Implantable Pulse Generator Implant Date [id number]    Lead Channel Setting Pacing Amplitude 2.4 V   Lead Channel Setting Pacing Pulse Width 0.4 ms   Lead Channel Setting Pacing Amplitude 2.0 V   Battery Voltage 2.92 V   Eval Rhythm AS,VS   Basic metabolic panel     Status: Abnormal   Collection Time: 10/06/16  2:00 PM  Result Value Ref Range   Sodium 140 135 - 145 mmol/L   Potassium 4.1 3.5 - 5.1 mmol/L   Chloride 103 101 - 111 mmol/L   CO2 29 22 - 32 mmol/L   Glucose, Bld 138 (H) 65 - 99 mg/dL   BUN 43 (H) 6 - 20 mg/dL   Creatinine, Ser 1.70 (H) 0.61 - 1.24 mg/dL   Calcium 9.6 8.9 - 10.3 mg/dL   GFR calc non Af Amer 45 (L) >60 mL/min   GFR calc Af Amer 52 (L) >60 mL/min    Comment: (NOTE) The eGFR has been calculated using the CKD EPI equation. This calculation has not been validated in all clinical situations. eGFR's persistently <60 mL/min signify possible Chronic Kidney Disease.    Anion gap 8 5 - 15  Glucose, capillary     Status: Abnormal   Collection Time: 10/09/16  6:19 AM  Result Value Ref Range   Glucose-Capillary 104 (H) 65 - 99 mg/dL  Surgical pathology     Status: None   Collection Time: 10/09/16  8:24 AM  Result Value Ref Range   SURGICAL PATHOLOGY      Surgical Pathology CASE: [id number] PATIENT: Paul [name] Surgical Pathology Report     SPECIMEN SUBMITTED: A. 5th toe, right, gangrene  CLINICAL HISTORY: None provided  PRE-OPERATIVE DIAGNOSIS: Skin ulcer right foot with necrosis of muscle L97.513  POST-OPERATIVE  DIAGNOSIS: Ulcer right foot, necrosis of muscle     DIAGNOSIS: A. TOE, RIGHT FIFTH; AMPUTATION: - SKIN AND SOFT TISSUE WITH CHRONIC ULCERATION AND UNDERLYING OSTEOMYELITIS. - VIABLE TISSUE AT MARGIN.   GROSS DESCRIPTION: A. Labeled: Gangrene fifth right toe  Size: 3.8 x 2 x  1.2 cm  Description of lesion(s): There is a pink red to purple discolored and granular area that measures 2.1 x 1.9 cm extending into the underlying soft tissue. The toenail is absent.  Proximal margin: The bone appears to be disarticulated, and the skin and soft tissue appears grossly viable.  Bone: Grossly unremarkable  Other findings: None  Block summary: 1- skin and soft tissue at margin of resection 2 - skin and soft tissue from discolored area 3- bone underlying discolored area, after decalcification 4- proximal bone, after decalcification Final Diagnosis performed by Quay Burow, MD.  Electronically signed 10/12/2016 9:39:44AM    The electronic signature indicates that the named Attending Pathologist has evaluated the specimen  Technical component performed at [facility], [address] [place], [place] [address] Lab: [phone] Dir: Darrick Penna. Evette Doffing, MD  Professional component performed at [facility], [facility], [place], [place], [place] [address] Lab: [phone] Dir: Dellia Nims. Rubinas, MD    Glucose, capillary     Status: Abnormal   Collection Time: 10/09/16  9:14 AM  Result Value Ref Range   Glucose-Capillary 108 (H) 65 - 99 mg/dL    Assessment/Plan:  HTN (hypertension) blood pressure control important in reducing the progression of atherosclerotic disease. On appropriate oral medications.   Diabetes ([place]) blood glucose control important in reducing the progression of atherosclerotic disease. Also, involved in wound healing. On appropriate medications.   CKD (chronic kidney disease), stage III Renal insufficiency can limit wound healing.  Ulcerated, foot ([place]) The patient has a wound on the right foot that has already failed an attempt at a synthetic skin graft and has been persistent for several months. Split thickness skin graft is certainly reasonable and I'll be happy to perform this. We will try to get this on the  schedule soon as possible for the patient. He has not had good function from the [facility], so I think I will avoid VAC placement at time of skin graft.      Leotis Pain 12/15/2016, 4:47 PM   This note was created with Dragon medical transcription system.  Any errors from dictation are unintentional.

## 2016-12-15 NOTE — Assessment & Plan Note (Signed)
The patient has a wound on the right foot that has already failed an attempt at a synthetic skin graft and has been persistent for several months. Split thickness skin graft is certainly reasonable and I'll be happy to perform this. We will try to get this on the schedule soon as possible for the patient. He has not had good function from the [facility], so I think I will avoid VAC placement at time of skin graft.

## 2016-12-15 NOTE — Assessment & Plan Note (Signed)
blood pressure control important in reducing the progression of atherosclerotic disease. On appropriate oral medications.  

## 2016-12-15 NOTE — Assessment & Plan Note (Signed)
blood glucose control important in reducing the progression of atherosclerotic disease. Also, involved in wound healing. On appropriate medications.  

## 2016-12-15 NOTE — Assessment & Plan Note (Signed)
Renal insufficiency can limit wound healing.

## 2016-12-15 NOTE — Patient Instructions (Signed)
Skin Grafting Skin grafting is a surgical procedure to cover an area of damaged or missing skin with a piece of healthy skin from another area of the body (donor site) or from a donor. You may have a graft using skin from:  Another part of your body (autograft).  The body of another person (allograft).  An animal's body (xenograft). You may need a skin graft if you have lost a large area of skin from a burn, wound, or pressure sore. You may also need skin grafting if you had a large piece of skin removed as a result of surgery. Skin grafting can help your skin to heal, prevent large scars, and prevent infection. The three main types of skin grafts are:  Split-thickness skin graft. This option works for wounds that are not deep. It is a graft that contains the top skin layer (epidermis) and a portion of the skin that contains blood vessels, nerves, hair follicles, and oil and sweat glands (dermis).  Full-thickness skin graft. This option is best for deep wounds or severe burns. It is a graft that requires all layers of skin as well as some supporting tissues underneath. When this type is done, a split-thickness graft may be used to cover the donor site.  Composite skin graft. This type is used for grafts that need more reconstruction. The graft might include cartilage and fat as well as skin. LET [facility] CARE PROVIDER KNOW ABOUT:  Any allergies you have.  All medicines you are taking, including vitamins, herbs, eye drops, creams, and over-the-counter medicines.  Previous problems you or members of your family have had with the use of anesthetics.  Any blood disorders you have.  Previous surgeries you have had.  Medical conditions you have. RISKS AND COMPLICATIONS Generally, this is a safe procedure. However, problems may occur, including:  Infection.  Loss of grafted skin.  Bleeding.  Blood under the skin (hematoma).  Scarring.  Need for additional grafts. BEFORE THE  PROCEDURE  Ask your health care provider about: ? Changing or stopping your regular medicines. This is especially important if you are taking diabetes medicines or blood thinners. ? Taking medicines such as aspirin and ibuprofen. These medicines can thin your blood. Do not take these medicines before your procedure if your health care provider instructs you not to.  Follow your health care provider's instructions about eating or drinking restrictions.  Plan to have someone take you home after the procedure.  Take a shower on the morning of the procedure. You may have to use a certain cleanser as specified by your health care provider. PROCEDURE  For a split-thickness graft:  An IV tube will be inserted into one of your veins.  You will be given a medicine that makes you fall asleep (general anesthetic).  Your wound will be cleaned to make sure it is free of dirt and to lower the risk of infection.  Your surgeon will stop the flow of blood to the wound.  Using a surgical tool called a dermatome, the surgeon will cut the epidermis and a small layer of dermis from the donor site. This piece will be slightly larger than the wound.  The donated tissue will be placed over the wound. It will be held in place with a pressure wrap, stitches (sutures), or both.  The site of the donated tissue will be covered with clean bandages (dressings) to protect against infection. The procedure may vary among health care providers and hospitals. For a full-thickness  graft or a composite graft:  An IV tube will be inserted into one of your veins.  You will be given a medicine that makes you fall asleep (general anesthetic).  Your wound will be cleaned to make sure it is free of dirt and to lower the risk of infection.  Your surgeon will stop the flow of blood to the wound.  Using a scalpel, the surgeon will cut out a section of skin, muscle, fat, and blood supply. This graft will be trimmed and then  placed over the wound.  The donated tissue will be held in place with absorbable stitches (sutures). A pressure wrap may also be used.  A split-thickness graft might be done to cover the donor site. The procedure may vary among health care providers and hospitals. AFTER THE PROCEDURE  You will be moved to a recovery area.  Your blood pressure, heart rate, breathing rate, and blood oxygen level will be monitored often until the medicines you were given have worn off.  You may be given antibiotic medicines. This information is not intended to replace advice given to you by your health care provider. Make sure you discuss any questions you have with your health care provider. Document Released: 12/18/2004 Document Revised: 05/11/2014 Document Reviewed: 01/03/2014 Elsevier Interactive Patient Education  2017 [employer].

## 2016-12-16 ENCOUNTER — Ambulatory Visit: Payer: Medicare Other | Admitting: Anesthesiology

## 2016-12-16 ENCOUNTER — Encounter
Admission: RE | Disposition: A | Discharge status: Home / Self Care | Payer: Self-pay | Source: Ambulatory Visit | Attending: Vascular Surgery

## 2016-12-16 ENCOUNTER — Ambulatory Visit
Admission: RE | Admit: 2016-12-16 | Discharge: 2016-12-16 | Disposition: A | Discharge status: Home / Self Care | Payer: Medicare Other | Source: Ambulatory Visit | Attending: Vascular Surgery | Admitting: Vascular Surgery

## 2016-12-16 ENCOUNTER — Encounter: Payer: Self-pay | Admitting: *Deleted

## 2016-12-16 DIAGNOSIS — E1051 Type 1 diabetes mellitus with diabetic peripheral angiopathy without gangrene: Secondary | ICD-10-CM | POA: Insufficient documentation

## 2016-12-16 DIAGNOSIS — Z7982 Long term (current) use of aspirin: Secondary | ICD-10-CM | POA: Insufficient documentation

## 2016-12-16 DIAGNOSIS — L97519 Non-pressure chronic ulcer of other part of right foot with unspecified severity: Secondary | ICD-10-CM | POA: Diagnosis not present

## 2016-12-16 DIAGNOSIS — I70238 Atherosclerosis of native arteries of right leg with ulceration of other part of lower right leg: Secondary | ICD-10-CM | POA: Diagnosis not present

## 2016-12-16 DIAGNOSIS — Z95810 Presence of automatic (implantable) cardiac defibrillator: Secondary | ICD-10-CM | POA: Diagnosis not present

## 2016-12-16 DIAGNOSIS — I129 Hypertensive chronic kidney disease with stage 1 through stage 4 chronic kidney disease, or unspecified chronic kidney disease: Secondary | ICD-10-CM | POA: Insufficient documentation

## 2016-12-16 DIAGNOSIS — T8189XA Other complications of procedures, not elsewhere classified, initial encounter: Secondary | ICD-10-CM | POA: Insufficient documentation

## 2016-12-16 DIAGNOSIS — I255 Ischemic cardiomyopathy: Secondary | ICD-10-CM | POA: Insufficient documentation

## 2016-12-16 DIAGNOSIS — E1022 Type 1 diabetes mellitus with diabetic chronic kidney disease: Secondary | ICD-10-CM | POA: Insufficient documentation

## 2016-12-16 DIAGNOSIS — Z951 Presence of aortocoronary bypass graft: Secondary | ICD-10-CM | POA: Diagnosis not present

## 2016-12-16 DIAGNOSIS — I509 Heart failure, unspecified: Secondary | ICD-10-CM | POA: Diagnosis not present

## 2016-12-16 DIAGNOSIS — N189 Chronic kidney disease, unspecified: Secondary | ICD-10-CM | POA: Diagnosis not present

## 2016-12-16 DIAGNOSIS — Z79899 Other long term (current) drug therapy: Secondary | ICD-10-CM | POA: Diagnosis not present

## 2016-12-16 DIAGNOSIS — Z8673 Personal history of transient ischemic attack (TIA), and cerebral infarction without residual deficits: Secondary | ICD-10-CM | POA: Diagnosis not present

## 2016-12-16 DIAGNOSIS — Y835 Amputation of limb(s) as the cause of abnormal reaction of the patient, or of later complication, without mention of misadventure at the time of the procedure: Secondary | ICD-10-CM | POA: Diagnosis not present

## 2016-12-16 DIAGNOSIS — I252 Old myocardial infarction: Secondary | ICD-10-CM | POA: Diagnosis not present

## 2016-12-16 DIAGNOSIS — Z794 Long term (current) use of insulin: Secondary | ICD-10-CM | POA: Insufficient documentation

## 2016-12-16 DIAGNOSIS — G4730 Sleep apnea, unspecified: Secondary | ICD-10-CM | POA: Insufficient documentation

## 2016-12-16 DIAGNOSIS — N183 Chronic kidney disease, stage 3 (moderate): Secondary | ICD-10-CM | POA: Insufficient documentation

## 2016-12-16 DIAGNOSIS — Z89421 Acquired absence of other right toe(s): Secondary | ICD-10-CM | POA: Diagnosis not present

## 2016-12-16 DIAGNOSIS — S91301A Unspecified open wound, right foot, initial encounter: Secondary | ICD-10-CM | POA: Diagnosis not present

## 2016-12-16 DIAGNOSIS — M109 Gout, unspecified: Secondary | ICD-10-CM | POA: Insufficient documentation

## 2016-12-16 DIAGNOSIS — I2510 Atherosclerotic heart disease of native coronary artery without angina pectoris: Secondary | ICD-10-CM | POA: Insufficient documentation

## 2016-12-16 DIAGNOSIS — I130 Hypertensive heart and chronic kidney disease with heart failure and stage 1 through stage 4 chronic kidney disease, or unspecified chronic kidney disease: Secondary | ICD-10-CM | POA: Diagnosis not present

## 2016-12-16 HISTORY — PX: GRAFT APPLICATION: SHX6696

## 2016-12-16 LAB — GLUCOSE, CAPILLARY: GLUCOSE-CAPILLARY: 206 mg/dL — AB (ref 65–99)

## 2016-12-16 SURGERY — GRAFT APPLICATION
Anesthesia: General | Laterality: Right | Wound class: Clean Contaminated

## 2016-12-16 MED ORDER — FAMOTIDINE 20 MG PO TABS
ORAL_TABLET | ORAL | Status: AC
  Administered 2016-12-16: 20 mg via ORAL
  Filled 2016-12-16: qty 1

## 2016-12-16 MED ORDER — FENTANYL CITRATE (PF) 100 MCG/2ML IJ SOLN
25.0000 ug | INTRAMUSCULAR | Status: DC | PRN
  Administered 2016-12-16 (×2): 25 ug via INTRAVENOUS

## 2016-12-16 MED ORDER — CEFAZOLIN SODIUM-DEXTROSE 1-4 GM/50ML-% IV SOLN
1.0000 g | Freq: Once | INTRAVENOUS | Status: AC
  Administered 2016-12-16: 1 g via INTRAVENOUS

## 2016-12-16 MED ORDER — MIDAZOLAM HCL 2 MG/2ML IJ SOLN
INTRAMUSCULAR | Status: DC | PRN
  Administered 2016-12-16: 2 mg via INTRAVENOUS

## 2016-12-16 MED ORDER — PHENYLEPHRINE HCL 10 MG/ML IJ SOLN
INTRAMUSCULAR | Status: DC | PRN
  Administered 2016-12-16: 100 ug via INTRAVENOUS
  Administered 2016-12-16: 200 ug via INTRAVENOUS

## 2016-12-16 MED ORDER — PROPOFOL 10 MG/ML IV BOLUS
INTRAVENOUS | Status: DC | PRN
  Administered 2016-12-16: 50 mg via INTRAVENOUS
  Administered 2016-12-16: 150 mg via INTRAVENOUS

## 2016-12-16 MED ORDER — LIDOCAINE HCL (CARDIAC) 20 MG/ML IV SOLN
INTRAVENOUS | Status: DC | PRN
  Administered 2016-12-16: 60 mg via INTRAVENOUS

## 2016-12-16 MED ORDER — FENTANYL CITRATE (PF) 100 MCG/2ML IJ SOLN
INTRAMUSCULAR | Status: AC
  Filled 2016-12-16: qty 2

## 2016-12-16 MED ORDER — FENTANYL CITRATE (PF) 100 MCG/2ML IJ SOLN
INTRAMUSCULAR | Status: DC | PRN
  Administered 2016-12-16: 50 ug via INTRAVENOUS

## 2016-12-16 MED ORDER — CEFAZOLIN SODIUM-DEXTROSE 1-4 GM/50ML-% IV SOLN
INTRAVENOUS | Status: AC
  Filled 2016-12-16: qty 50

## 2016-12-16 MED ORDER — SODIUM CHLORIDE 0.9 % IV SOLN
INTRAVENOUS | Status: DC
  Administered 2016-12-16 (×2): via INTRAVENOUS

## 2016-12-16 MED ORDER — ONDANSETRON HCL 4 MG/2ML IJ SOLN
INTRAMUSCULAR | Status: DC | PRN
  Administered 2016-12-16 (×2): 4 mg via INTRAVENOUS

## 2016-12-16 MED ORDER — FAMOTIDINE 20 MG PO TABS
20.0000 mg | ORAL_TABLET | Freq: Once | ORAL | Status: AC
  Administered 2016-12-16: 20 mg via ORAL

## 2016-12-16 MED ORDER — MINERAL OIL LIGHT 100 % EX OIL
TOPICAL_OIL | CUTANEOUS | Status: AC
  Filled 2016-12-16: qty 25

## 2016-12-16 MED ORDER — EPHEDRINE SULFATE 50 MG/ML IJ SOLN
INTRAMUSCULAR | Status: DC | PRN
  Administered 2016-12-16: 10 mg via INTRAVENOUS

## 2016-12-16 MED ORDER — MIDAZOLAM HCL 2 MG/2ML IJ SOLN
INTRAMUSCULAR | Status: AC
  Filled 2016-12-16: qty 2

## 2016-12-16 MED ORDER — ONDANSETRON HCL 4 MG/2ML IJ SOLN: 4.0000 mg | Freq: Once | INTRAMUSCULAR | Status: DC | PRN

## 2016-12-16 SURGICAL SUPPLY — 25 items
BLADE CLIPPER SURG (BLADE) ×3 IMPLANT
BLADE DERMATOME SS (BLADE) ×3 IMPLANT
BNDG GAUZE 4.5X4.1 6PLY STRL (MISCELLANEOUS) ×3 IMPLANT
CHLORAPREP W/TINT 26ML (MISCELLANEOUS) ×3 IMPLANT
DERMACARRIER 1-1.5 (MISCELLANEOUS) ×3 IMPLANT
DERMACARRIER 3-1 (MISCELLANEOUS) ×3 IMPLANT
DRAPE LAPAROTOMY 100X77 ABD (DRAPES) ×3 IMPLANT
DRAPE SHEET LG 3/4 BI-LAMINATE (DRAPES) ×3 IMPLANT
DRSG MEPITEL 4X7.2 (GAUZE/BANDAGES/DRESSINGS) ×3 IMPLANT
DRSG TEGADERM 4X4.75 (GAUZE/BANDAGES/DRESSINGS) ×3 IMPLANT
DRSG TEGADERM 6X8 (GAUZE/BANDAGES/DRESSINGS) ×3 IMPLANT
DRSG TELFA 3X8 NADH (GAUZE/BANDAGES/DRESSINGS) ×3 IMPLANT
ELECT REM PT RETURN 9FT ADLT (ELECTROSURGICAL) ×3
ELECTRODE REM PT RTRN 9FT ADLT (ELECTROSURGICAL) ×1 IMPLANT
GAUZE SPONGE 4X4 12PLY STRL (GAUZE/BANDAGES/DRESSINGS) ×3 IMPLANT
GLOVE BIO SURGEON STRL SZ7 (GLOVE) ×6 IMPLANT
GLOVE INDICATOR 7.5 STRL GRN (GLOVE) ×3 IMPLANT
GOWN STRL REUS W/ TWL LRG LVL3 (GOWN DISPOSABLE) ×3 IMPLANT
GOWN STRL REUS W/TWL LRG LVL3 (GOWN DISPOSABLE) ×6
KIT RM TURNOVER STRD PROC AR (KITS) ×3 IMPLANT
LABEL OR SOLS (LABEL) ×3 IMPLANT
NS IRRIG 500ML POUR BTL (IV SOLUTION) ×3 IMPLANT
PACK BASIN MINOR ARMC (MISCELLANEOUS) ×3 IMPLANT
SPONGE LAP 18X18 5 PK (GAUZE/BANDAGES/DRESSINGS) ×3 IMPLANT
SUT ETHILON 5-0 FS-2 18 BLK (SUTURE) IMPLANT

## 2016-12-16 NOTE — Anesthesia Post-op Follow-up Note (Signed)
Anesthesia QCDR form completed.        

## 2016-12-16 NOTE — Transfer of Care (Signed)
Immediate Anesthesia Transfer of Care Note  Patient: Paul [name].  Procedure(s) Performed: Procedure(s): FULL THICKNESS SKIN GRAFT-RIGHT FOOT (Right)  Patient Location: PACU  Anesthesia Type:General  Level of Consciousness: sedated  Airway & Oxygen Therapy: Patient Spontanous Breathing and Patient connected to nasal cannula oxygen  Post-op Assessment: Report given to RN and Post -op Vital signs reviewed and stable  Post vital signs: Reviewed and stable  Last Vitals:  Vitals:   12/16/16 1141  BP: (!) 150/101  Pulse: 80  Resp: 18  Temp: (!) 36.1 C  SpO2: 100%    Last Pain:  Vitals:   12/16/16 1141  TempSrc: Tympanic         Complications: No apparent anesthesia complications

## 2016-12-16 NOTE — Anesthesia Postprocedure Evaluation (Signed)
Anesthesia Post Note  Patient: Paul [name].  Procedure(s) Performed: Procedure(s) (LRB): FULL THICKNESS SKIN GRAFT-RIGHT FOOT (Right)  Patient location during evaluation: PACU Anesthesia Type: General Level of consciousness: awake Pain management: pain level controlled Vital Signs Assessment: post-procedure vital signs reviewed and stable Respiratory status: spontaneous breathing Cardiovascular status: stable Anesthetic complications: no     Last Vitals:  Vitals:   12/16/16 1410 12/16/16 1425  BP: (!) 150/90 (!) 157/97  Pulse: 73 75  Resp: (!) 23 16  Temp: (!) 36.2 C 36.5 C  SpO2: 100% 98%    Last Pain:  Vitals:   12/16/16 1425  TempSrc: Oral  PainSc: 0-No pain                 VAN STAVEREN,Klair Leising

## 2016-12-16 NOTE — Op Note (Signed)
[facility]   OPERATIVE NOTE  DATE: 12/16/2016  PRE-OPERATIVE DIAGNOSIS: nonhealing wound right foot after digital amputations  POST-OPERATIVE DIAGNOSIS: same as above  PROCEDURE: 1.   Split thickness skin graft to wound on the right foot approximately 10 cm using the right thigh as the donor site.  SURGEON: Leotis Pain  ASSISTANT(S): none  ANESTHESIA: Gen.  ESTIMATED BLOOD LOSS: 10 cc  FINDING(S): 1.  none  SPECIMEN(S):  none  INDICATIONS:   Paul [name]. is a 51 y.o. male who presents with nonhealing wound of the right foot status post digital amputations. He is artery had attempts at synthetic skin grafts which have failed, and he was referred to me for evaluation for split thickness skin graft. He desired to proceed with this. Risks and benefits were discussed..  DESCRIPTION: After obtaining full informed written consent, the patient was brought back to the operating room and placed supine upon the operating table.  The patient received IV antibiotics prior to induction.  After obtaining adequate anesthesia, the patient was prepped and draped in the standard fashion. We started by freshening the wound by removing any fibrinous exudate with gauze. The wound was about 5 cm in its longest length and about 3 cm in its widest width but was more diamond shaped and so its total dimensions was probably closer to 10 cm. I then set the Zimmer dermatome to 0.015" and used the anterior right thigh as the donor site.  The skin was prepared with mineral oil and traction was created to avoid any difficulties. The donor skin was then meshed with 1.5 to 1 plates. It was then stapled to the skin edge and spread out to ensure complete coverage of the wound. Pressure was held at the donor site and hemostasis was achieved. Xeroform and Tegaderm were placed as a dressing at the donor site. After the skin graft had been secured shiny side down with staples, Mepitel, Xeroform,  fluffed Kerlix and wrapped Kerlix were used to create a tight gauze dressing over the wound. The patient was then awakened from [facility] and taken to the recovery room in stable condition.  COMPLICATIONS: None  CONDITION: Stable  Leotis Pain  12/16/2016, 1:17 PM    This note was created with Dragon Medical transcription system. Any errors in dictation are purely unintentional.

## 2016-12-16 NOTE — OR Nursing (Signed)
Dr. Lucky Cowboy in to see patient prior to discharge home.  Patient to remain out of work for at least 1 week. He may return with no limitations at that time. Also, he reviewed the instructions for the dressings and the care of them.  Dr. Lucky Cowboy also provided patient with a pain prescription for Norco.  Patient states understanding of all of these instructions and will keep right foot elevated and keep off of it for the next few days.  Remains comfortable at this time.  Unaware of grafting site on upper right thigh.

## 2016-12-16 NOTE — H&P (Signed)
[facility] History & Physical Update  The patient was interviewed and re-examined.  The patient's previous History and Physical has been reviewed and is unchanged.  There is no change in the plan of care. We plan to proceed with the scheduled procedure.  Leotis Pain, MD  12/16/2016, 11:37 AM

## 2016-12-16 NOTE — Discharge Instructions (Signed)
AMBULATORY SURGERY  DISCHARGE INSTRUCTIONS   1) The drugs that you were given will stay in your system until tomorrow so for the next 24 hours you should not:  A) Drive an automobile B) Make any legal decisions C) Drink any alcoholic beverage   2) You may resume regular meals tomorrow.  Today it is better to start with liquids and gradually work up to solid foods.  You may eat anything you prefer, but it is better to start with liquids, then soup and crackers, and gradually work up to solid foods.   3) Please notify your doctor immediately if you have any unusual bleeding, trouble breathing, redness and pain at the surgery site, drainage, fever, or pain not relieved by medication.    Additional Instructions:Keep your right foot and leg elevated. Keep on a soft surface.  Wiggle your toes every hour and move your leg/ankle also.     Please contact your physician with any problems or Same Day Surgery at [phone], Monday through Friday 6 am to 4 pm, or [place] at [facility] number at [phone].

## 2016-12-16 NOTE — Anesthesia Preprocedure Evaluation (Addendum)
Anesthesia Evaluation  Patient identified by MRN, date of birth, ID band Patient awake    Reviewed: Allergy & Precautions, NPO status , Patient's Chart, lab work & pertinent test results  Airway Mallampati: II       Dental  (+) Upper Dentures   Pulmonary sleep apnea ,    breath sounds clear to auscultation       Cardiovascular Exercise Tolerance: Good hypertension, + Past MI, + CABG and +CHF  + pacemaker  Rhythm:Regular Rate:Normal     Neuro/Psych CVA    GI/Hepatic negative GI ROS, Neg liver ROS,   Endo/Other  diabetes, Type 1, Insulin Dependent  Renal/GU Renal InsufficiencyRenal disease     Musculoskeletal   Abdominal (+) + obese,   Peds  Hematology   Anesthesia Other Findings   Reproductive/Obstetrics                             Anesthesia Physical Anesthesia Plan  ASA: III  Anesthesia Plan: General   Post-op Pain Management:    Induction: Intravenous  PONV Risk Score and Plan:   Airway Management Planned: LMA  Additional Equipment:   Intra-op Plan:   Post-operative Plan: Extubation in OR  Informed Consent: I have reviewed the patients History and Physical, chart, labs and discussed the procedure including the risks, benefits and alternatives for the proposed anesthesia with the patient or authorized representative who has indicated his/her understanding and acceptance.     Plan Discussed with: CRNA  Anesthesia Plan Comments:         Anesthesia Quick Evaluation

## 2016-12-16 NOTE — Anesthesia Procedure Notes (Signed)
Procedure Name: LMA Insertion Date/Time: 12/16/2016 1:21 PM Performed by: [facility] Pre-anesthesia Checklist: Patient identified, Patient being monitored, Timeout performed, Emergency Drugs available and Suction available Patient Re-evaluated:Patient Re-evaluated prior to induction Oxygen Delivery Method: Circle system utilized Preoxygenation: Pre-oxygenation with 100% oxygen Induction Type: IV induction Ventilation: Mask ventilation without difficulty LMA: LMA inserted Tube type: Oral Number of attempts: 1 Placement Confirmation: positive ETCO2 and breath sounds checked- equal and bilateral Tube secured with: Tape Dental Injury: Teeth and Oropharynx as per pre-operative assessment

## 2016-12-18 DIAGNOSIS — I2510 Atherosclerotic heart disease of native coronary artery without angina pectoris: Secondary | ICD-10-CM | POA: Diagnosis not present

## 2016-12-18 DIAGNOSIS — I5022 Chronic systolic (congestive) heart failure: Secondary | ICD-10-CM | POA: Diagnosis not present

## 2016-12-18 DIAGNOSIS — Z4781 Encounter for orthopedic aftercare following surgical amputation: Secondary | ICD-10-CM | POA: Diagnosis not present

## 2016-12-18 DIAGNOSIS — I130 Hypertensive heart and chronic kidney disease with heart failure and stage 1 through stage 4 chronic kidney disease, or unspecified chronic kidney disease: Secondary | ICD-10-CM | POA: Diagnosis not present

## 2016-12-18 DIAGNOSIS — E1152 Type 2 diabetes mellitus with diabetic peripheral angiopathy with gangrene: Secondary | ICD-10-CM | POA: Diagnosis not present

## 2016-12-18 DIAGNOSIS — Z48812 Encounter for surgical aftercare following surgery on the circulatory system: Secondary | ICD-10-CM | POA: Diagnosis not present

## 2016-12-21 ENCOUNTER — Telehealth (INDEPENDENT_AMBULATORY_CARE_PROVIDER_SITE_OTHER): Payer: Self-pay

## 2016-12-21 DIAGNOSIS — Z4781 Encounter for orthopedic aftercare following surgical amputation: Secondary | ICD-10-CM | POA: Diagnosis not present

## 2016-12-21 DIAGNOSIS — Z48812 Encounter for surgical aftercare following surgery on the circulatory system: Secondary | ICD-10-CM | POA: Diagnosis not present

## 2016-12-21 DIAGNOSIS — I2510 Atherosclerotic heart disease of native coronary artery without angina pectoris: Secondary | ICD-10-CM | POA: Diagnosis not present

## 2016-12-21 DIAGNOSIS — E1152 Type 2 diabetes mellitus with diabetic peripheral angiopathy with gangrene: Secondary | ICD-10-CM | POA: Diagnosis not present

## 2016-12-21 DIAGNOSIS — I5022 Chronic systolic (congestive) heart failure: Secondary | ICD-10-CM | POA: Diagnosis not present

## 2016-12-21 DIAGNOSIS — I130 Hypertensive heart and chronic kidney disease with heart failure and stage 1 through stage 4 chronic kidney disease, or unspecified chronic kidney disease: Secondary | ICD-10-CM | POA: Diagnosis not present

## 2016-12-21 NOTE — Telephone Encounter (Signed)
Returned the call to [place] from [employer], I left a message regarding the patient and not replacing the wound vac.

## 2016-12-21 NOTE — Telephone Encounter (Signed)
Larena Glassman from [employer] called stating the patients wound vac was rolled up so she took it off but the graft is intact and still looks good, she wants to know if it should be replaced or not.

## 2016-12-22 ENCOUNTER — Ambulatory Visit (INDEPENDENT_AMBULATORY_CARE_PROVIDER_SITE_OTHER): Payer: Medicare Other | Admitting: *Deleted

## 2016-12-22 ENCOUNTER — Telehealth: Payer: Self-pay | Admitting: Cardiology

## 2016-12-22 DIAGNOSIS — I5022 Chronic systolic (congestive) heart failure: Secondary | ICD-10-CM

## 2016-12-22 NOTE — Telephone Encounter (Signed)
Spoke with pt and reminded pt of remote transmission that is due today. Pt verbalized understanding.   

## 2016-12-23 ENCOUNTER — Telehealth (INDEPENDENT_AMBULATORY_CARE_PROVIDER_SITE_OTHER): Payer: Self-pay

## 2016-12-23 DIAGNOSIS — Z4781 Encounter for orthopedic aftercare following surgical amputation: Secondary | ICD-10-CM | POA: Diagnosis not present

## 2016-12-23 DIAGNOSIS — E1152 Type 2 diabetes mellitus with diabetic peripheral angiopathy with gangrene: Secondary | ICD-10-CM | POA: Diagnosis not present

## 2016-12-23 DIAGNOSIS — Z48812 Encounter for surgical aftercare following surgery on the circulatory system: Secondary | ICD-10-CM | POA: Diagnosis not present

## 2016-12-23 DIAGNOSIS — I130 Hypertensive heart and chronic kidney disease with heart failure and stage 1 through stage 4 chronic kidney disease, or unspecified chronic kidney disease: Secondary | ICD-10-CM | POA: Diagnosis not present

## 2016-12-23 DIAGNOSIS — I2510 Atherosclerotic heart disease of native coronary artery without angina pectoris: Secondary | ICD-10-CM | POA: Diagnosis not present

## 2016-12-23 DIAGNOSIS — I5022 Chronic systolic (congestive) heart failure: Secondary | ICD-10-CM | POA: Diagnosis not present

## 2016-12-23 NOTE — Progress Notes (Signed)
Remote ICD transmission.   

## 2016-12-23 NOTE — Telephone Encounter (Signed)
[name] with [facility] called and asked she can continue care for [name] until his first follow up visit here in the office, because his order for service ends today with her?

## 2016-12-23 NOTE — Telephone Encounter (Signed)
Called Judeen Hammans back to let her know that she can take a verbal for Mr. Rosemond to continue his services with her until he comes in for his follow up with [place] in a few more days.

## 2016-12-25 DIAGNOSIS — E1152 Type 2 diabetes mellitus with diabetic peripheral angiopathy with gangrene: Secondary | ICD-10-CM | POA: Diagnosis not present

## 2016-12-25 DIAGNOSIS — I5022 Chronic systolic (congestive) heart failure: Secondary | ICD-10-CM | POA: Diagnosis not present

## 2016-12-25 DIAGNOSIS — Z48812 Encounter for surgical aftercare following surgery on the circulatory system: Secondary | ICD-10-CM | POA: Diagnosis not present

## 2016-12-25 DIAGNOSIS — Z4781 Encounter for orthopedic aftercare following surgical amputation: Secondary | ICD-10-CM | POA: Diagnosis not present

## 2016-12-25 DIAGNOSIS — I2510 Atherosclerotic heart disease of native coronary artery without angina pectoris: Secondary | ICD-10-CM | POA: Diagnosis not present

## 2016-12-25 DIAGNOSIS — I130 Hypertensive heart and chronic kidney disease with heart failure and stage 1 through stage 4 chronic kidney disease, or unspecified chronic kidney disease: Secondary | ICD-10-CM | POA: Diagnosis not present

## 2016-12-28 ENCOUNTER — Encounter: Payer: Self-pay | Admitting: Vascular Surgery

## 2016-12-28 DIAGNOSIS — I5022 Chronic systolic (congestive) heart failure: Secondary | ICD-10-CM | POA: Diagnosis not present

## 2016-12-28 DIAGNOSIS — Z4781 Encounter for orthopedic aftercare following surgical amputation: Secondary | ICD-10-CM | POA: Diagnosis not present

## 2016-12-28 DIAGNOSIS — I130 Hypertensive heart and chronic kidney disease with heart failure and stage 1 through stage 4 chronic kidney disease, or unspecified chronic kidney disease: Secondary | ICD-10-CM | POA: Diagnosis not present

## 2016-12-28 DIAGNOSIS — Z48812 Encounter for surgical aftercare following surgery on the circulatory system: Secondary | ICD-10-CM | POA: Diagnosis not present

## 2016-12-28 DIAGNOSIS — I2510 Atherosclerotic heart disease of native coronary artery without angina pectoris: Secondary | ICD-10-CM | POA: Diagnosis not present

## 2016-12-28 DIAGNOSIS — E1152 Type 2 diabetes mellitus with diabetic peripheral angiopathy with gangrene: Secondary | ICD-10-CM | POA: Diagnosis not present

## 2016-12-30 DIAGNOSIS — I5022 Chronic systolic (congestive) heart failure: Secondary | ICD-10-CM | POA: Diagnosis not present

## 2016-12-30 DIAGNOSIS — Z48812 Encounter for surgical aftercare following surgery on the circulatory system: Secondary | ICD-10-CM | POA: Diagnosis not present

## 2016-12-30 DIAGNOSIS — Z4781 Encounter for orthopedic aftercare following surgical amputation: Secondary | ICD-10-CM | POA: Diagnosis not present

## 2016-12-30 DIAGNOSIS — I130 Hypertensive heart and chronic kidney disease with heart failure and stage 1 through stage 4 chronic kidney disease, or unspecified chronic kidney disease: Secondary | ICD-10-CM | POA: Diagnosis not present

## 2016-12-30 DIAGNOSIS — E1152 Type 2 diabetes mellitus with diabetic peripheral angiopathy with gangrene: Secondary | ICD-10-CM | POA: Diagnosis not present

## 2016-12-30 DIAGNOSIS — I2510 Atherosclerotic heart disease of native coronary artery without angina pectoris: Secondary | ICD-10-CM | POA: Diagnosis not present

## 2017-01-01 ENCOUNTER — Ambulatory Visit (INDEPENDENT_AMBULATORY_CARE_PROVIDER_SITE_OTHER): Payer: Medicare Other | Admitting: Vascular Surgery

## 2017-01-01 ENCOUNTER — Encounter: Payer: Self-pay | Admitting: Cardiology

## 2017-01-01 ENCOUNTER — Encounter (INDEPENDENT_AMBULATORY_CARE_PROVIDER_SITE_OTHER): Payer: Self-pay | Admitting: Vascular Surgery

## 2017-01-01 VITALS — BP 161/98 | HR 90 | Resp 17 | Wt 257.0 lb

## 2017-01-01 DIAGNOSIS — I6529 Occlusion and stenosis of unspecified carotid artery: Secondary | ICD-10-CM

## 2017-01-01 DIAGNOSIS — L97511 Non-pressure chronic ulcer of other part of right foot limited to breakdown of skin: Secondary | ICD-10-CM

## 2017-01-01 DIAGNOSIS — L089 Local infection of the skin and subcutaneous tissue, unspecified: Secondary | ICD-10-CM

## 2017-01-01 DIAGNOSIS — E11628 Type 2 diabetes mellitus with other skin complications: Secondary | ICD-10-CM

## 2017-01-01 DIAGNOSIS — I10 Essential (primary) hypertension: Secondary | ICD-10-CM

## 2017-01-01 NOTE — Assessment & Plan Note (Signed)
blood glucose control important in reducing the progression of atherosclerotic disease. Also, involved in wound healing. On appropriate medications.  

## 2017-01-01 NOTE — Progress Notes (Signed)
Patient ID: Paul Ala., male   DOB: 06-03-1965, 51 y.o.   MRN: [id number]  Chief Complaint  Patient presents with  . Routine Post Op    HPI Paul [name]. is a 51 y.o. male.  Patient returns in follow-up after his skin graft last week. He is doing well. His donor site is healing. He has no fever or chills.   Past Medical History:  Diagnosis Date  . Anemia   . Cardiac defibrillator in place    a. Biotronik LUmax 540 DRT, (ser # [id number]).  . Carotid arterial disease ([place])    a. s/p prior LICA stenting;  b. 06/6832 Carotid U/S: 40-59% bilat ICA stenosis. Patent LICA stent.  . CKD (chronic kidney disease), stage III   . Coronary artery disease    a. 2010 s/p CABG x 3.  . DDD (degenerative disc disease), lumbosacral    L5-S1  . Diabetes ([place])    Lantus at bedtime  . Gangrene of toe of right foot ([place])   . Gout of left hand 10/06/2016  . HFrEF (heart failure with reduced ejection fraction) ([place])    a. 07/2016 Echo: EF 25-30%, diff HK, mild MR, mildly dil LA, mod reduced RV fxn, PASP 71mmHg.  Marland Kitchen Hypertension    takes Coreg daily  . Ischemic cardiomyopathy    a. 07/2016 Echo: EF 25-30%, diff HK.  . Myocardial infarction ([place]) 2009  . Peripheral vascular disease ([place])   . Sleep apnea    sleep study yr ago-unable to afford cpap  . Stroke [facility]) 09   no weakness    Past Surgical History:  Procedure Laterality Date  . AMPUTATION TOE Right 08/22/2016   Procedure: AMPUTATION TOE;  Surgeon: Samara Deist, DPM;  Location: [facility];  Service: Podiatry;  Laterality: Right;  . AMPUTATION TOE Right 10/09/2016   Procedure: AMPUTATION TOE-RIGHT 2ND MPJ;  Surgeon: Samara Deist, DPM;  Location: [facility];  Service: Podiatry;  Laterality: Right;  . APLIGRAFT PLACEMENT Right 10/09/2016   Procedure: APLIGRAFT PLACEMENT;  Surgeon: Samara Deist, DPM;  Location: [facility];  Service: Podiatry;  Laterality: Right;  . CARDIAC DEFIBRILLATOR PLACEMENT  2011  . CAROTID ENDARTERECTOMY Left   .  CHOLECYSTECTOMY  2010  . CORONARY ARTERY BYPASS GRAFT  2010   CABG x 3   . GRAFT APPLICATION Right 1/96/2229   Procedure: FULL THICKNESS SKIN GRAFT-RIGHT FOOT;  Surgeon: Algernon Huxley, MD;  Location: [facility];  Service: Vascular;  Laterality: Right;  . HIP SURGERY Right 1994  . INCISION AND DRAINAGE OF WOUND Right 08/22/2016   Procedure: IRRIGATION AND DEBRIDEMENT WOUND and wound vac placement;  Surgeon: Samara Deist, DPM;  Location: [facility];  Service: Podiatry;  Laterality: Right;  . LOWER EXTREMITY ANGIOGRAPHY Right 08/24/2016   Procedure: Lower Extremity Angiography;  Surgeon: Algernon Huxley, MD;  Location: [facility];  Service: Cardiovascular;  Laterality: Right;  . MASS EXCISION Right 07/18/2014   Procedure: EXCISION HETEROTOPIC BONE RIGHT HIP;  Surgeon: Frederik Pear, MD;  Location: [place];  Service: Orthopedics;  Laterality: Right;  . Open Heart Surgery  2010   x 3  . WOUND DEBRIDEMENT Right 10/09/2016   Procedure: DEBRIDEMENT WOUND;  Surgeon: Samara Deist, DPM;  Location: [facility];  Service: Podiatry;  Laterality: Right;      Allergies  Allergen Reactions  . Ibuprofen Other (See Comments)    Heart problems    Current Outpatient Prescriptions  Medication Sig Dispense Refill  .  aspirin EC 81 MG tablet Take 1 tablet (81 mg total) by mouth daily. 90 tablet 3  . carvedilol (COREG) 12.5 MG tablet Take 12.5 mg by mouth daily.     . colchicine 0.6 MG tablet Take 1 tablet (0.6 mg total) by mouth daily as needed. (Patient taking differently: Take 0.6 mg by mouth daily as needed (for gout flares). ) 30 tablet 0  . insulin glargine (LANTUS) 100 UNIT/ML injection Inject 24 Units into the skin every morning.     Marland Kitchen spironolactone (ALDACTONE) 25 MG tablet Take 25 mg by mouth daily.    Marland Kitchen torsemide (DEMADEX) 20 MG tablet Take 1 tablet (20 mg total) by mouth daily. 90 tablet 3   No current facility-administered medications for this visit.         Physical Exam BP (!) 161/98   Pulse  90   Resp 17   Wt 116.6 kg (257 lb)   BMI 35.84 kg/m  Gen:  WD/WN, NAD Skin: incision C/D/I From the donor site. Wound on the right foot with what appears to be about a 40-50% take of the skin graft at best. The Adaptic was removed today. There is no surrounding erythema or drainage.     Assessment/Plan:  HTN (hypertension) blood pressure control important in reducing the progression of atherosclerotic disease. On appropriate oral medications.   Diabetic foot infection ([place]) blood glucose control important in reducing the progression of atherosclerotic disease. Also, involved in wound healing. On appropriate medications.   Ulcerated, foot ([place]) At current, there appears to be partial take of the skin graft on the foot. The Adaptic was removed. We will place a Xeroform and gauze. This can be changed every few days. His donor site is healing reasonably well and he can do Xeroform today and then just go to Neosporin as needed. I'll see him back in about 3 weeks.      Leotis Pain 01/01/2017, 3:40 PM   This note was created with Dragon medical transcription system.  Any errors from dictation are unintentional.

## 2017-01-01 NOTE — Assessment & Plan Note (Signed)
At current, there appears to be partial take of the skin graft on the foot. The Adaptic was removed. We will place a Xeroform and gauze. This can be changed every few days. His donor site is healing reasonably well and he can do Xeroform today and then just go to Neosporin as needed. I'll see him back in about 3 weeks.

## 2017-01-01 NOTE — Assessment & Plan Note (Signed)
blood pressure control important in reducing the progression of atherosclerotic disease. On appropriate oral medications.  

## 2017-01-05 ENCOUNTER — Encounter: Payer: Self-pay | Admitting: Emergency Medicine

## 2017-01-05 ENCOUNTER — Emergency Department: Payer: Medicare Other

## 2017-01-05 DIAGNOSIS — Z794 Long term (current) use of insulin: Secondary | ICD-10-CM | POA: Insufficient documentation

## 2017-01-05 DIAGNOSIS — Z951 Presence of aortocoronary bypass graft: Secondary | ICD-10-CM | POA: Insufficient documentation

## 2017-01-05 DIAGNOSIS — I2510 Atherosclerotic heart disease of native coronary artery without angina pectoris: Secondary | ICD-10-CM | POA: Diagnosis not present

## 2017-01-05 DIAGNOSIS — Z95810 Presence of automatic (implantable) cardiac defibrillator: Secondary | ICD-10-CM | POA: Insufficient documentation

## 2017-01-05 DIAGNOSIS — Y999 Unspecified external cause status: Secondary | ICD-10-CM | POA: Insufficient documentation

## 2017-01-05 DIAGNOSIS — N183 Chronic kidney disease, stage 3 (moderate): Secondary | ICD-10-CM | POA: Diagnosis not present

## 2017-01-05 DIAGNOSIS — S01112A Laceration without foreign body of left eyelid and periocular area, initial encounter: Secondary | ICD-10-CM | POA: Insufficient documentation

## 2017-01-05 DIAGNOSIS — I130 Hypertensive heart and chronic kidney disease with heart failure and stage 1 through stage 4 chronic kidney disease, or unspecified chronic kidney disease: Secondary | ICD-10-CM | POA: Diagnosis not present

## 2017-01-05 DIAGNOSIS — Y939 Activity, unspecified: Secondary | ICD-10-CM | POA: Diagnosis not present

## 2017-01-05 DIAGNOSIS — Z79899 Other long term (current) drug therapy: Secondary | ICD-10-CM | POA: Insufficient documentation

## 2017-01-05 DIAGNOSIS — E1122 Type 2 diabetes mellitus with diabetic chronic kidney disease: Secondary | ICD-10-CM | POA: Insufficient documentation

## 2017-01-05 DIAGNOSIS — S0993XA Unspecified injury of face, initial encounter: Secondary | ICD-10-CM | POA: Diagnosis not present

## 2017-01-05 DIAGNOSIS — I5022 Chronic systolic (congestive) heart failure: Secondary | ICD-10-CM | POA: Diagnosis not present

## 2017-01-05 DIAGNOSIS — Y929 Unspecified place or not applicable: Secondary | ICD-10-CM | POA: Diagnosis not present

## 2017-01-05 DIAGNOSIS — S40021A Contusion of right upper arm, initial encounter: Secondary | ICD-10-CM | POA: Diagnosis not present

## 2017-01-05 DIAGNOSIS — S31150A Open bite of abdominal wall, right upper quadrant without penetration into peritoneal cavity, initial encounter: Secondary | ICD-10-CM | POA: Diagnosis not present

## 2017-01-05 DIAGNOSIS — T7411XA Adult physical abuse, confirmed, initial encounter: Secondary | ICD-10-CM | POA: Diagnosis not present

## 2017-01-05 DIAGNOSIS — Z7982 Long term (current) use of aspirin: Secondary | ICD-10-CM | POA: Insufficient documentation

## 2017-01-05 DIAGNOSIS — S31159A Open bite of abdominal wall, unspecified quadrant without penetration into peritoneal cavity, initial encounter: Secondary | ICD-10-CM | POA: Diagnosis not present

## 2017-01-05 DIAGNOSIS — S8011XA Contusion of right lower leg, initial encounter: Secondary | ICD-10-CM | POA: Diagnosis not present

## 2017-01-05 DIAGNOSIS — R51 Headache: Secondary | ICD-10-CM | POA: Diagnosis not present

## 2017-01-05 DIAGNOSIS — S022XXA Fracture of nasal bones, initial encounter for closed fracture: Secondary | ICD-10-CM | POA: Diagnosis not present

## 2017-01-05 DIAGNOSIS — M545 Low back pain: Secondary | ICD-10-CM | POA: Diagnosis not present

## 2017-01-05 DIAGNOSIS — S0990XA Unspecified injury of head, initial encounter: Secondary | ICD-10-CM | POA: Diagnosis not present

## 2017-01-05 DIAGNOSIS — S0083XA Contusion of other part of head, initial encounter: Secondary | ICD-10-CM | POA: Diagnosis not present
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
## 2017-01-06 ENCOUNTER — Emergency Department: Payer: Medicare Other

## 2017-01-06 ENCOUNTER — Emergency Department
Admission: EM | Admit: 2017-01-06 | Discharge: 2017-01-06 | Disposition: A | Discharge status: Home / Self Care | Payer: Medicare Other | Attending: Emergency Medicine | Admitting: Emergency Medicine

## 2017-01-06 DIAGNOSIS — I130 Hypertensive heart and chronic kidney disease with heart failure and stage 1 through stage 4 chronic kidney disease, or unspecified chronic kidney disease: Secondary | ICD-10-CM | POA: Diagnosis not present

## 2017-01-06 DIAGNOSIS — M545 Low back pain: Secondary | ICD-10-CM | POA: Diagnosis not present

## 2017-01-06 DIAGNOSIS — I2510 Atherosclerotic heart disease of native coronary artery without angina pectoris: Secondary | ICD-10-CM | POA: Diagnosis not present

## 2017-01-06 DIAGNOSIS — Z48812 Encounter for surgical aftercare following surgery on the circulatory system: Secondary | ICD-10-CM | POA: Diagnosis not present

## 2017-01-06 DIAGNOSIS — S0181XA Laceration without foreign body of other part of head, initial encounter: Secondary | ICD-10-CM

## 2017-01-06 DIAGNOSIS — W503XXA Accidental bite by another person, initial encounter: Secondary | ICD-10-CM

## 2017-01-06 DIAGNOSIS — Z4781 Encounter for orthopedic aftercare following surgical amputation: Secondary | ICD-10-CM | POA: Diagnosis not present

## 2017-01-06 DIAGNOSIS — E1152 Type 2 diabetes mellitus with diabetic peripheral angiopathy with gangrene: Secondary | ICD-10-CM | POA: Diagnosis not present

## 2017-01-06 DIAGNOSIS — I5022 Chronic systolic (congestive) heart failure: Secondary | ICD-10-CM | POA: Diagnosis not present

## 2017-01-06 DIAGNOSIS — S0083XA Contusion of other part of head, initial encounter: Secondary | ICD-10-CM

## 2017-01-06 LAB — RAPID HIV SCREEN (HIV 1/2 AB+AG)
HIV 1/2 ANTIBODIES: NONREACTIVE
HIV-1 P24 ANTIGEN - HIV24: NONREACTIVE

## 2017-01-06 MED ORDER — OXYCODONE-ACETAMINOPHEN 5-325 MG PO TABS: 1.0000 | ORAL_TABLET | Freq: Four times a day (QID) | ORAL | 0 refills | Status: DC | PRN

## 2017-01-06 MED ORDER — LIDOCAINE-EPINEPHRINE-TETRACAINE (LET) SOLUTION
3.0000 mL | Freq: Once | NASAL | Status: AC
  Administered 2017-01-06: 3 mL via TOPICAL

## 2017-01-06 MED ORDER — MORPHINE SULFATE (PF) 4 MG/ML IV SOLN
4.0000 mg | Freq: Once | INTRAVENOUS | Status: AC
  Administered 2017-01-06: 4 mg via INTRAVENOUS

## 2017-01-06 MED ORDER — LIDOCAINE HCL (PF) 1 % IJ SOLN
5.0000 mL | Freq: Once | INTRAMUSCULAR | Status: DC
  Filled 2017-01-06: qty 5

## 2017-01-06 MED ORDER — BACITRACIN ZINC 500 UNIT/GM EX OINT
TOPICAL_OINTMENT | Freq: Once | CUTANEOUS | Status: AC
  Administered 2017-01-06: 1 via TOPICAL

## 2017-01-06 MED ORDER — BACITRACIN ZINC 500 UNIT/GM EX OINT
TOPICAL_OINTMENT | CUTANEOUS | Status: AC
  Administered 2017-01-06: 1 via TOPICAL
  Filled 2017-01-06: qty 0.9

## 2017-01-06 MED ORDER — AMOXICILLIN-POT CLAVULANATE 875-125 MG PO TABS: 1.0000 | ORAL_TABLET | Freq: Two times a day (BID) | ORAL | 0 refills | Status: AC

## 2017-01-06 MED ORDER — ONDANSETRON HCL 4 MG/2ML IJ SOLN
4.0000 mg | Freq: Once | INTRAMUSCULAR | Status: AC
  Administered 2017-01-06: 4 mg via INTRAVENOUS
  Filled 2017-01-06: qty 2

## 2017-01-06 MED ORDER — LIDOCAINE-EPINEPHRINE-TETRACAINE (LET) SOLUTION
NASAL | Status: AC
  Administered 2017-01-06: 3 mL via TOPICAL
  Filled 2017-01-06: qty 3

## 2017-01-06 MED ORDER — MORPHINE SULFATE (PF) 4 MG/ML IV SOLN
4.0000 mg | Freq: Once | INTRAVENOUS | Status: DC
  Filled 2017-01-06: qty 1

## 2017-01-06 NOTE — ED Notes (Signed)
MD at the bedside for laceration repair.

## 2017-01-06 NOTE — ED Notes (Signed)
pt to CT
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
## 2017-01-06 NOTE — Discharge Instructions (Signed)
PLease have sutures removed in 5 days. Please follow up with primary care physician

## 2017-01-07 DIAGNOSIS — E1152 Type 2 diabetes mellitus with diabetic peripheral angiopathy with gangrene: Secondary | ICD-10-CM | POA: Diagnosis not present

## 2017-01-07 DIAGNOSIS — I5022 Chronic systolic (congestive) heart failure: Secondary | ICD-10-CM | POA: Diagnosis not present

## 2017-01-07 DIAGNOSIS — I2510 Atherosclerotic heart disease of native coronary artery without angina pectoris: Secondary | ICD-10-CM | POA: Diagnosis not present

## 2017-01-07 DIAGNOSIS — Z4781 Encounter for orthopedic aftercare following surgical amputation: Secondary | ICD-10-CM | POA: Diagnosis not present

## 2017-01-07 DIAGNOSIS — I130 Hypertensive heart and chronic kidney disease with heart failure and stage 1 through stage 4 chronic kidney disease, or unspecified chronic kidney disease: Secondary | ICD-10-CM | POA: Diagnosis not present

## 2017-01-07 DIAGNOSIS — Z48812 Encounter for surgical aftercare following surgery on the circulatory system: Secondary | ICD-10-CM | POA: Diagnosis not present

## 2017-01-07 LAB — HEPATITIS PANEL, ACUTE
HEP A IGM: NEGATIVE
HEP B C IGM: NEGATIVE
Hepatitis B Surface Ag: NEGATIVE

## 2017-01-08 ENCOUNTER — Telehealth (INDEPENDENT_AMBULATORY_CARE_PROVIDER_SITE_OTHER): Payer: Self-pay

## 2017-01-08 DIAGNOSIS — Z48812 Encounter for surgical aftercare following surgery on the circulatory system: Secondary | ICD-10-CM | POA: Diagnosis not present

## 2017-01-08 DIAGNOSIS — E1152 Type 2 diabetes mellitus with diabetic peripheral angiopathy with gangrene: Secondary | ICD-10-CM | POA: Diagnosis not present

## 2017-01-08 DIAGNOSIS — Z4781 Encounter for orthopedic aftercare following surgical amputation: Secondary | ICD-10-CM | POA: Diagnosis not present

## 2017-01-08 DIAGNOSIS — I5022 Chronic systolic (congestive) heart failure: Secondary | ICD-10-CM | POA: Diagnosis not present

## 2017-01-08 DIAGNOSIS — I130 Hypertensive heart and chronic kidney disease with heart failure and stage 1 through stage 4 chronic kidney disease, or unspecified chronic kidney disease: Secondary | ICD-10-CM | POA: Diagnosis not present

## 2017-01-08 DIAGNOSIS — I2510 Atherosclerotic heart disease of native coronary artery without angina pectoris: Secondary | ICD-10-CM | POA: Diagnosis not present
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
## 2017-01-11 DIAGNOSIS — I2510 Atherosclerotic heart disease of native coronary artery without angina pectoris: Secondary | ICD-10-CM | POA: Diagnosis not present

## 2017-01-11 DIAGNOSIS — Z4781 Encounter for orthopedic aftercare following surgical amputation: Secondary | ICD-10-CM | POA: Diagnosis not present

## 2017-01-11 DIAGNOSIS — I5022 Chronic systolic (congestive) heart failure: Secondary | ICD-10-CM | POA: Diagnosis not present

## 2017-01-11 DIAGNOSIS — Z48812 Encounter for surgical aftercare following surgery on the circulatory system: Secondary | ICD-10-CM | POA: Diagnosis not present

## 2017-01-11 DIAGNOSIS — I130 Hypertensive heart and chronic kidney disease with heart failure and stage 1 through stage 4 chronic kidney disease, or unspecified chronic kidney disease: Secondary | ICD-10-CM | POA: Diagnosis not present

## 2017-01-11 DIAGNOSIS — E1152 Type 2 diabetes mellitus with diabetic peripheral angiopathy with gangrene: Secondary | ICD-10-CM | POA: Diagnosis not present

## 2017-01-12 DIAGNOSIS — M109 Gout, unspecified: Secondary | ICD-10-CM | POA: Diagnosis not present

## 2017-01-12 DIAGNOSIS — N183 Chronic kidney disease, stage 3 (moderate): Secondary | ICD-10-CM | POA: Diagnosis not present

## 2017-01-12 DIAGNOSIS — Z4802 Encounter for removal of sutures: Secondary | ICD-10-CM | POA: Diagnosis not present

## 2017-01-12 DIAGNOSIS — E1151 Type 2 diabetes mellitus with diabetic peripheral angiopathy without gangrene: Secondary | ICD-10-CM | POA: Diagnosis not present

## 2017-01-12 DIAGNOSIS — I739 Peripheral vascular disease, unspecified: Secondary | ICD-10-CM | POA: Diagnosis not present

## 2017-01-13 DIAGNOSIS — Z4781 Encounter for orthopedic aftercare following surgical amputation: Secondary | ICD-10-CM | POA: Diagnosis not present

## 2017-01-13 DIAGNOSIS — E1152 Type 2 diabetes mellitus with diabetic peripheral angiopathy with gangrene: Secondary | ICD-10-CM | POA: Diagnosis not present

## 2017-01-13 DIAGNOSIS — I2510 Atherosclerotic heart disease of native coronary artery without angina pectoris: Secondary | ICD-10-CM | POA: Diagnosis not present

## 2017-01-13 DIAGNOSIS — I130 Hypertensive heart and chronic kidney disease with heart failure and stage 1 through stage 4 chronic kidney disease, or unspecified chronic kidney disease: Secondary | ICD-10-CM | POA: Diagnosis not present

## 2017-01-13 DIAGNOSIS — I5022 Chronic systolic (congestive) heart failure: Secondary | ICD-10-CM | POA: Diagnosis not present

## 2017-01-13 DIAGNOSIS — Z48812 Encounter for surgical aftercare following surgery on the circulatory system: Secondary | ICD-10-CM | POA: Diagnosis not present

## 2017-01-15 ENCOUNTER — Ambulatory Visit (INDEPENDENT_AMBULATORY_CARE_PROVIDER_SITE_OTHER): Payer: Medicare Other | Admitting: Vascular Surgery

## 2017-01-15 ENCOUNTER — Encounter (INDEPENDENT_AMBULATORY_CARE_PROVIDER_SITE_OTHER): Payer: Self-pay | Admitting: Vascular Surgery

## 2017-01-15 VITALS — BP 153/96 | HR 89 | Resp 15 | Ht 69.0 in | Wt 244.0 lb

## 2017-01-15 DIAGNOSIS — L089 Local infection of the skin and subcutaneous tissue, unspecified: Secondary | ICD-10-CM

## 2017-01-15 DIAGNOSIS — L97511 Non-pressure chronic ulcer of other part of right foot limited to breakdown of skin: Secondary | ICD-10-CM

## 2017-01-15 DIAGNOSIS — I10 Essential (primary) hypertension: Secondary | ICD-10-CM

## 2017-01-15 DIAGNOSIS — E11628 Type 2 diabetes mellitus with other skin complications: Secondary | ICD-10-CM

## 2017-01-15 DIAGNOSIS — I6529 Occlusion and stenosis of unspecified carotid artery: Secondary | ICD-10-CM
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
## 2017-01-15 NOTE — Assessment & Plan Note (Signed)
blood pressure control important in reducing the progression of atherosclerotic disease. On appropriate oral medications.  

## 2017-01-15 NOTE — Assessment & Plan Note (Signed)
blood glucose control important in reducing the progression of atherosclerotic disease. Also, involved in wound healing. On appropriate medications.  
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
## 2017-01-18 ENCOUNTER — Telehealth (INDEPENDENT_AMBULATORY_CARE_PROVIDER_SITE_OTHER): Payer: Self-pay

## 2017-01-18 NOTE — Telephone Encounter (Signed)
Larena Glassman called from [facility] to say that she has been unable to reach the patient all weekend, he stated that he was moving to [facility], and now his phone is off.  What orders if any, would you recommend for this patient if he should need [facility] again? Or if he transfers his services to [facility].
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
## 2017-01-20 ENCOUNTER — Encounter: Payer: Self-pay | Admitting: Cardiology

## 2017-01-28 ENCOUNTER — Telehealth: Payer: Self-pay | Admitting: Cardiology

## 2017-01-28 DIAGNOSIS — M109 Gout, unspecified: Secondary | ICD-10-CM | POA: Diagnosis not present

## 2017-01-28 NOTE — Telephone Encounter (Signed)
°  New Prob  Pt has a question regarding his device. Please call.

## 2017-01-28 NOTE — Telephone Encounter (Signed)
Returned patient call who needed to identify between his old and new home monitor. I confirmed the SN of his new monitor. He denied any further questions.

## 2017-02-03 DIAGNOSIS — I5022 Chronic systolic (congestive) heart failure: Secondary | ICD-10-CM | POA: Diagnosis not present

## 2017-02-03 DIAGNOSIS — E1122 Type 2 diabetes mellitus with diabetic chronic kidney disease: Secondary | ICD-10-CM | POA: Diagnosis not present

## 2017-02-03 DIAGNOSIS — R079 Chest pain, unspecified: Secondary | ICD-10-CM | POA: Diagnosis not present

## 2017-02-03 DIAGNOSIS — M7989 Other specified soft tissue disorders: Secondary | ICD-10-CM | POA: Diagnosis not present

## 2017-02-03 DIAGNOSIS — R05 Cough: Secondary | ICD-10-CM | POA: Diagnosis not present

## 2017-02-03 DIAGNOSIS — I2510 Atherosclerotic heart disease of native coronary artery without angina pectoris: Secondary | ICD-10-CM | POA: Diagnosis not present

## 2017-02-03 DIAGNOSIS — Z951 Presence of aortocoronary bypass graft: Secondary | ICD-10-CM | POA: Diagnosis not present

## 2017-02-03 DIAGNOSIS — M109 Gout, unspecified: Secondary | ICD-10-CM | POA: Diagnosis present

## 2017-02-03 DIAGNOSIS — L97511 Non-pressure chronic ulcer of other part of right foot limited to breakdown of skin: Secondary | ICD-10-CM | POA: Diagnosis present

## 2017-02-03 DIAGNOSIS — L0889 Other specified local infections of the skin and subcutaneous tissue: Secondary | ICD-10-CM | POA: Diagnosis not present

## 2017-02-03 DIAGNOSIS — M25542 Pain in joints of left hand: Secondary | ICD-10-CM | POA: Diagnosis not present

## 2017-02-03 DIAGNOSIS — M86161 Other acute osteomyelitis, right tibia and fibula: Secondary | ICD-10-CM | POA: Diagnosis not present

## 2017-02-03 DIAGNOSIS — N189 Chronic kidney disease, unspecified: Secondary | ICD-10-CM | POA: Diagnosis not present

## 2017-02-03 DIAGNOSIS — M064 Inflammatory polyarthropathy: Secondary | ICD-10-CM | POA: Diagnosis not present

## 2017-02-03 DIAGNOSIS — D72829 Elevated white blood cell count, unspecified: Secondary | ICD-10-CM | POA: Diagnosis not present

## 2017-02-03 DIAGNOSIS — Z95810 Presence of automatic (implantable) cardiac defibrillator: Secondary | ICD-10-CM | POA: Diagnosis not present

## 2017-02-03 DIAGNOSIS — M13842 Other specified arthritis, left hand: Secondary | ICD-10-CM | POA: Diagnosis not present

## 2017-02-03 DIAGNOSIS — L97519 Non-pressure chronic ulcer of other part of right foot with unspecified severity: Secondary | ICD-10-CM | POA: Diagnosis not present

## 2017-02-03 DIAGNOSIS — T8149XA Infection following a procedure, other surgical site, initial encounter: Secondary | ICD-10-CM | POA: Diagnosis not present

## 2017-02-03 DIAGNOSIS — R509 Fever, unspecified: Secondary | ICD-10-CM | POA: Diagnosis not present

## 2017-02-03 DIAGNOSIS — L03115 Cellulitis of right lower limb: Secondary | ICD-10-CM | POA: Diagnosis present

## 2017-02-03 DIAGNOSIS — B9689 Other specified bacterial agents as the cause of diseases classified elsewhere: Secondary | ICD-10-CM | POA: Diagnosis not present

## 2017-02-03 DIAGNOSIS — E11621 Type 2 diabetes mellitus with foot ulcer: Secondary | ICD-10-CM | POA: Diagnosis not present

## 2017-02-03 DIAGNOSIS — L97411 Non-pressure chronic ulcer of right heel and midfoot limited to breakdown of skin: Secondary | ICD-10-CM | POA: Diagnosis not present

## 2017-02-03 DIAGNOSIS — Z8673 Personal history of transient ischemic attack (TIA), and cerebral infarction without residual deficits: Secondary | ICD-10-CM | POA: Diagnosis not present

## 2017-02-03 DIAGNOSIS — M86071 Acute hematogenous osteomyelitis, right ankle and foot: Secondary | ICD-10-CM | POA: Diagnosis not present

## 2017-02-03 DIAGNOSIS — Z794 Long term (current) use of insulin: Secondary | ICD-10-CM | POA: Diagnosis not present

## 2017-02-03 DIAGNOSIS — E1142 Type 2 diabetes mellitus with diabetic polyneuropathy: Secondary | ICD-10-CM | POA: Diagnosis not present

## 2017-02-03 DIAGNOSIS — Z89421 Acquired absence of other right toe(s): Secondary | ICD-10-CM | POA: Diagnosis not present

## 2017-02-03 DIAGNOSIS — T8789 Other complications of amputation stump: Secondary | ICD-10-CM | POA: Diagnosis not present

## 2017-02-03 DIAGNOSIS — M868X7 Other osteomyelitis, ankle and foot: Secondary | ICD-10-CM | POA: Diagnosis not present

## 2017-02-03 DIAGNOSIS — I130 Hypertensive heart and chronic kidney disease with heart failure and stage 1 through stage 4 chronic kidney disease, or unspecified chronic kidney disease: Secondary | ICD-10-CM | POA: Diagnosis present

## 2017-02-03 DIAGNOSIS — N183 Chronic kidney disease, stage 3 (moderate): Secondary | ICD-10-CM | POA: Diagnosis not present

## 2017-02-03 DIAGNOSIS — A419 Sepsis, unspecified organism: Secondary | ICD-10-CM | POA: Diagnosis not present

## 2017-02-03 DIAGNOSIS — Z833 Family history of diabetes mellitus: Secondary | ICD-10-CM | POA: Diagnosis not present

## 2017-02-03 DIAGNOSIS — L089 Local infection of the skin and subcutaneous tissue, unspecified: Secondary | ICD-10-CM | POA: Diagnosis not present

## 2017-02-03 DIAGNOSIS — M86171 Other acute osteomyelitis, right ankle and foot: Secondary | ICD-10-CM | POA: Diagnosis not present

## 2017-02-03 DIAGNOSIS — E1169 Type 2 diabetes mellitus with other specified complication: Secondary | ICD-10-CM | POA: Diagnosis not present

## 2017-02-03 DIAGNOSIS — E118 Type 2 diabetes mellitus with unspecified complications: Secondary | ICD-10-CM | POA: Diagnosis not present

## 2017-02-09 LAB — CUP PACEART REMOTE DEVICE CHECK
Battery Remaining Percentage: 4 %
Battery Voltage: 2.92 V
Brady Statistic AP VP Percent: 0 %
Brady Statistic RA Percent Paced: 0 %
Brady Statistic RV Percent Paced: 1 %
Date Time Interrogation Session: 20180823045921
HIGH POWER IMPEDANCE MEASURED VALUE: 41 Ohm
Implantable Pulse Generator Implant Date: 20110215
Lead Channel Impedance Value: 550 Ohm
Lead Channel Setting Pacing Amplitude: 2 V
Lead Channel Setting Pacing Amplitude: 2.4 V
Lead Channel Setting Pacing Pulse Width: 0.4 ms
MDC IDC MSMT LEADCHNL RV PACING THRESHOLD AMPLITUDE: 0.7 V
MDC IDC MSMT LEADCHNL RV PACING THRESHOLD PULSEWIDTH: 0.4 ms
MDC IDC PG SERIAL: 60489978
MDC IDC STAT BRADY AP VS PERCENT: 0 %
MDC IDC STAT BRADY AS VP PERCENT: 1 %
MDC IDC STAT BRADY AS VS PERCENT: 96 %

## 2017-02-12 ENCOUNTER — Ambulatory Visit (INDEPENDENT_AMBULATORY_CARE_PROVIDER_SITE_OTHER): Payer: Medicare Other | Admitting: Vascular Surgery

## 2017-02-16 DIAGNOSIS — E11621 Type 2 diabetes mellitus with foot ulcer: Secondary | ICD-10-CM | POA: Diagnosis not present

## 2017-02-16 DIAGNOSIS — Z95810 Presence of automatic (implantable) cardiac defibrillator: Secondary | ICD-10-CM | POA: Diagnosis not present

## 2017-02-16 DIAGNOSIS — Z89421 Acquired absence of other right toe(s): Secondary | ICD-10-CM | POA: Diagnosis not present

## 2017-02-16 DIAGNOSIS — M064 Inflammatory polyarthropathy: Secondary | ICD-10-CM | POA: Diagnosis not present

## 2017-02-16 DIAGNOSIS — Z950 Presence of cardiac pacemaker: Secondary | ICD-10-CM | POA: Diagnosis not present

## 2017-02-16 DIAGNOSIS — E1122 Type 2 diabetes mellitus with diabetic chronic kidney disease: Secondary | ICD-10-CM | POA: Diagnosis not present

## 2017-02-16 DIAGNOSIS — I5022 Chronic systolic (congestive) heart failure: Secondary | ICD-10-CM | POA: Diagnosis not present

## 2017-02-16 DIAGNOSIS — M86171 Other acute osteomyelitis, right ankle and foot: Secondary | ICD-10-CM | POA: Diagnosis not present

## 2017-02-16 DIAGNOSIS — N183 Chronic kidney disease, stage 3 (moderate): Secondary | ICD-10-CM | POA: Diagnosis not present

## 2017-02-16 DIAGNOSIS — E1169 Type 2 diabetes mellitus with other specified complication: Secondary | ICD-10-CM | POA: Diagnosis not present

## 2017-02-16 DIAGNOSIS — I2510 Atherosclerotic heart disease of native coronary artery without angina pectoris: Secondary | ICD-10-CM | POA: Diagnosis not present

## 2017-02-16 DIAGNOSIS — L97412 Non-pressure chronic ulcer of right heel and midfoot with fat layer exposed: Secondary | ICD-10-CM | POA: Diagnosis not present

## 2017-02-16 DIAGNOSIS — I130 Hypertensive heart and chronic kidney disease with heart failure and stage 1 through stage 4 chronic kidney disease, or unspecified chronic kidney disease: Secondary | ICD-10-CM | POA: Diagnosis not present

## 2017-02-16 DIAGNOSIS — M109 Gout, unspecified: Secondary | ICD-10-CM | POA: Diagnosis not present

## 2017-02-16 DIAGNOSIS — T8743 Infection of amputation stump, right lower extremity: Secondary | ICD-10-CM | POA: Diagnosis not present

## 2017-02-16 DIAGNOSIS — Z794 Long term (current) use of insulin: Secondary | ICD-10-CM | POA: Diagnosis not present

## 2017-02-16 DIAGNOSIS — I429 Cardiomyopathy, unspecified: Secondary | ICD-10-CM | POA: Diagnosis not present

## 2017-02-18 DIAGNOSIS — M064 Inflammatory polyarthropathy: Secondary | ICD-10-CM | POA: Diagnosis not present

## 2017-02-18 DIAGNOSIS — E1169 Type 2 diabetes mellitus with other specified complication: Secondary | ICD-10-CM | POA: Diagnosis not present

## 2017-02-18 DIAGNOSIS — I130 Hypertensive heart and chronic kidney disease with heart failure and stage 1 through stage 4 chronic kidney disease, or unspecified chronic kidney disease: Secondary | ICD-10-CM | POA: Diagnosis not present

## 2017-02-18 DIAGNOSIS — M86171 Other acute osteomyelitis, right ankle and foot: Secondary | ICD-10-CM | POA: Diagnosis not present

## 2017-02-18 DIAGNOSIS — I2510 Atherosclerotic heart disease of native coronary artery without angina pectoris: Secondary | ICD-10-CM | POA: Diagnosis not present

## 2017-02-18 DIAGNOSIS — T8743 Infection of amputation stump, right lower extremity: Secondary | ICD-10-CM | POA: Diagnosis not present

## 2017-02-21 DIAGNOSIS — M545 Low back pain: Secondary | ICD-10-CM | POA: Diagnosis not present

## 2017-02-21 DIAGNOSIS — N289 Disorder of kidney and ureter, unspecified: Secondary | ICD-10-CM | POA: Diagnosis not present

## 2017-02-21 DIAGNOSIS — D649 Anemia, unspecified: Secondary | ICD-10-CM | POA: Diagnosis not present

## 2017-02-21 DIAGNOSIS — M109 Gout, unspecified: Secondary | ICD-10-CM | POA: Diagnosis not present

## 2017-02-21 DIAGNOSIS — M7989 Other specified soft tissue disorders: Secondary | ICD-10-CM | POA: Diagnosis not present

## 2017-02-21 DIAGNOSIS — M79641 Pain in right hand: Secondary | ICD-10-CM | POA: Diagnosis not present

## 2017-02-21 DIAGNOSIS — L309 Dermatitis, unspecified: Secondary | ICD-10-CM | POA: Diagnosis not present

## 2017-02-21 DIAGNOSIS — M79642 Pain in left hand: Secondary | ICD-10-CM | POA: Diagnosis not present

## 2017-02-24 DIAGNOSIS — T8743 Infection of amputation stump, right lower extremity: Secondary | ICD-10-CM | POA: Diagnosis not present

## 2017-02-24 DIAGNOSIS — I2510 Atherosclerotic heart disease of native coronary artery without angina pectoris: Secondary | ICD-10-CM | POA: Diagnosis not present

## 2017-02-24 DIAGNOSIS — I130 Hypertensive heart and chronic kidney disease with heart failure and stage 1 through stage 4 chronic kidney disease, or unspecified chronic kidney disease: Secondary | ICD-10-CM | POA: Diagnosis not present

## 2017-02-24 DIAGNOSIS — M86171 Other acute osteomyelitis, right ankle and foot: Secondary | ICD-10-CM | POA: Diagnosis not present

## 2017-02-24 DIAGNOSIS — M064 Inflammatory polyarthropathy: Secondary | ICD-10-CM | POA: Diagnosis not present

## 2017-02-24 DIAGNOSIS — E1169 Type 2 diabetes mellitus with other specified complication: Secondary | ICD-10-CM | POA: Diagnosis not present

## 2017-02-25 DIAGNOSIS — L97412 Non-pressure chronic ulcer of right heel and midfoot with fat layer exposed: Secondary | ICD-10-CM | POA: Diagnosis not present

## 2017-02-25 DIAGNOSIS — E11621 Type 2 diabetes mellitus with foot ulcer: Secondary | ICD-10-CM | POA: Diagnosis not present

## 2017-03-03 DIAGNOSIS — M86171 Other acute osteomyelitis, right ankle and foot: Secondary | ICD-10-CM | POA: Diagnosis not present

## 2017-03-03 DIAGNOSIS — I130 Hypertensive heart and chronic kidney disease with heart failure and stage 1 through stage 4 chronic kidney disease, or unspecified chronic kidney disease: Secondary | ICD-10-CM | POA: Diagnosis not present

## 2017-03-03 DIAGNOSIS — E1169 Type 2 diabetes mellitus with other specified complication: Secondary | ICD-10-CM | POA: Diagnosis not present

## 2017-03-03 DIAGNOSIS — T8743 Infection of amputation stump, right lower extremity: Secondary | ICD-10-CM | POA: Diagnosis not present

## 2017-03-03 DIAGNOSIS — I2510 Atherosclerotic heart disease of native coronary artery without angina pectoris: Secondary | ICD-10-CM | POA: Diagnosis not present

## 2017-03-03 DIAGNOSIS — M064 Inflammatory polyarthropathy: Secondary | ICD-10-CM | POA: Diagnosis not present

## 2017-03-10 DIAGNOSIS — I130 Hypertensive heart and chronic kidney disease with heart failure and stage 1 through stage 4 chronic kidney disease, or unspecified chronic kidney disease: Secondary | ICD-10-CM | POA: Diagnosis not present

## 2017-03-10 DIAGNOSIS — I2510 Atherosclerotic heart disease of native coronary artery without angina pectoris: Secondary | ICD-10-CM | POA: Diagnosis not present

## 2017-03-10 DIAGNOSIS — E1169 Type 2 diabetes mellitus with other specified complication: Secondary | ICD-10-CM | POA: Diagnosis not present

## 2017-03-10 DIAGNOSIS — M86171 Other acute osteomyelitis, right ankle and foot: Secondary | ICD-10-CM | POA: Diagnosis not present

## 2017-03-10 DIAGNOSIS — T8743 Infection of amputation stump, right lower extremity: Secondary | ICD-10-CM | POA: Diagnosis not present

## 2017-03-10 DIAGNOSIS — M064 Inflammatory polyarthropathy: Secondary | ICD-10-CM | POA: Diagnosis not present

## 2017-03-15 DIAGNOSIS — Z794 Long term (current) use of insulin: Secondary | ICD-10-CM | POA: Diagnosis not present

## 2017-03-15 DIAGNOSIS — M109 Gout, unspecified: Secondary | ICD-10-CM | POA: Diagnosis not present

## 2017-03-15 DIAGNOSIS — M25421 Effusion, right elbow: Secondary | ICD-10-CM | POA: Diagnosis not present

## 2017-03-15 DIAGNOSIS — I130 Hypertensive heart and chronic kidney disease with heart failure and stage 1 through stage 4 chronic kidney disease, or unspecified chronic kidney disease: Secondary | ICD-10-CM | POA: Diagnosis not present

## 2017-03-15 DIAGNOSIS — Z7982 Long term (current) use of aspirin: Secondary | ICD-10-CM | POA: Diagnosis not present

## 2017-03-15 DIAGNOSIS — M25422 Effusion, left elbow: Secondary | ICD-10-CM | POA: Diagnosis not present

## 2017-03-15 DIAGNOSIS — Z886 Allergy status to analgesic agent status: Secondary | ICD-10-CM | POA: Diagnosis not present

## 2017-03-15 DIAGNOSIS — M25431 Effusion, right wrist: Secondary | ICD-10-CM | POA: Diagnosis not present

## 2017-03-15 DIAGNOSIS — Z79899 Other long term (current) drug therapy: Secondary | ICD-10-CM | POA: Diagnosis not present

## 2017-03-15 DIAGNOSIS — M25521 Pain in right elbow: Secondary | ICD-10-CM | POA: Diagnosis not present

## 2017-03-15 DIAGNOSIS — Z9889 Other specified postprocedural states: Secondary | ICD-10-CM | POA: Diagnosis not present

## 2017-03-15 DIAGNOSIS — N183 Chronic kidney disease, stage 3 (moderate): Secondary | ICD-10-CM | POA: Diagnosis not present

## 2017-03-15 DIAGNOSIS — E1122 Type 2 diabetes mellitus with diabetic chronic kidney disease: Secondary | ICD-10-CM | POA: Diagnosis not present

## 2017-03-18 DIAGNOSIS — L97412 Non-pressure chronic ulcer of right heel and midfoot with fat layer exposed: Secondary | ICD-10-CM | POA: Diagnosis not present

## 2017-03-18 DIAGNOSIS — E11621 Type 2 diabetes mellitus with foot ulcer: Secondary | ICD-10-CM | POA: Diagnosis not present

## 2017-03-23 ENCOUNTER — Telehealth: Payer: Self-pay | Admitting: Cardiology

## 2017-03-23 ENCOUNTER — Ambulatory Visit: Payer: Medicare Other | Admitting: *Deleted

## 2017-03-23 NOTE — Telephone Encounter (Signed)
Spoke with pt and reminded pt of remote transmission that is due today. Pt verbalized understanding.   

## 2017-03-24 ENCOUNTER — Encounter: Payer: Self-pay | Admitting: Cardiology

## 2017-03-24 NOTE — Progress Notes (Signed)
Opened in error / remote not received

## 2017-03-30 DIAGNOSIS — L97412 Non-pressure chronic ulcer of right heel and midfoot with fat layer exposed: Secondary | ICD-10-CM | POA: Diagnosis not present

## 2017-03-30 DIAGNOSIS — T8131XA Disruption of external operation (surgical) wound, not elsewhere classified, initial encounter: Secondary | ICD-10-CM | POA: Diagnosis not present

## 2017-03-30 DIAGNOSIS — E11621 Type 2 diabetes mellitus with foot ulcer: Secondary | ICD-10-CM | POA: Diagnosis not present

## 2017-04-07 DIAGNOSIS — I130 Hypertensive heart and chronic kidney disease with heart failure and stage 1 through stage 4 chronic kidney disease, or unspecified chronic kidney disease: Secondary | ICD-10-CM | POA: Diagnosis present

## 2017-04-07 DIAGNOSIS — Z951 Presence of aortocoronary bypass graft: Secondary | ICD-10-CM | POA: Diagnosis not present

## 2017-04-07 DIAGNOSIS — I4892 Unspecified atrial flutter: Secondary | ICD-10-CM | POA: Diagnosis not present

## 2017-04-07 DIAGNOSIS — I248 Other forms of acute ischemic heart disease: Secondary | ICD-10-CM | POA: Diagnosis not present

## 2017-04-07 DIAGNOSIS — I444 Left anterior fascicular block: Secondary | ICD-10-CM | POA: Diagnosis not present

## 2017-04-07 DIAGNOSIS — I517 Cardiomegaly: Secondary | ICD-10-CM | POA: Diagnosis not present

## 2017-04-07 DIAGNOSIS — I4510 Unspecified right bundle-branch block: Secondary | ICD-10-CM | POA: Diagnosis not present

## 2017-04-07 DIAGNOSIS — M7989 Other specified soft tissue disorders: Secondary | ICD-10-CM | POA: Diagnosis not present

## 2017-04-07 DIAGNOSIS — I452 Bifascicular block: Secondary | ICD-10-CM | POA: Diagnosis present

## 2017-04-07 DIAGNOSIS — I5022 Chronic systolic (congestive) heart failure: Secondary | ICD-10-CM | POA: Diagnosis not present

## 2017-04-07 DIAGNOSIS — I361 Nonrheumatic tricuspid (valve) insufficiency: Secondary | ICD-10-CM | POA: Diagnosis not present

## 2017-04-07 DIAGNOSIS — M109 Gout, unspecified: Secondary | ICD-10-CM | POA: Diagnosis present

## 2017-04-07 DIAGNOSIS — R748 Abnormal levels of other serum enzymes: Secondary | ICD-10-CM | POA: Diagnosis not present

## 2017-04-07 DIAGNOSIS — R001 Bradycardia, unspecified: Secondary | ICD-10-CM | POA: Diagnosis not present

## 2017-04-07 DIAGNOSIS — E11621 Type 2 diabetes mellitus with foot ulcer: Secondary | ICD-10-CM | POA: Diagnosis present

## 2017-04-07 DIAGNOSIS — M25541 Pain in joints of right hand: Secondary | ICD-10-CM | POA: Diagnosis not present

## 2017-04-07 DIAGNOSIS — R7989 Other specified abnormal findings of blood chemistry: Secondary | ICD-10-CM | POA: Diagnosis not present

## 2017-04-07 DIAGNOSIS — Z7901 Long term (current) use of anticoagulants: Secondary | ICD-10-CM | POA: Diagnosis not present

## 2017-04-07 DIAGNOSIS — E119 Type 2 diabetes mellitus without complications: Secondary | ICD-10-CM | POA: Diagnosis not present

## 2017-04-07 DIAGNOSIS — M25542 Pain in joints of left hand: Secondary | ICD-10-CM | POA: Diagnosis not present

## 2017-04-07 DIAGNOSIS — I10 Essential (primary) hypertension: Secondary | ICD-10-CM | POA: Diagnosis not present

## 2017-04-07 DIAGNOSIS — Z950 Presence of cardiac pacemaker: Secondary | ICD-10-CM | POA: Diagnosis not present

## 2017-04-07 DIAGNOSIS — E1165 Type 2 diabetes mellitus with hyperglycemia: Secondary | ICD-10-CM | POA: Diagnosis present

## 2017-04-07 DIAGNOSIS — L97412 Non-pressure chronic ulcer of right heel and midfoot with fat layer exposed: Secondary | ICD-10-CM | POA: Diagnosis not present

## 2017-04-07 DIAGNOSIS — I454 Nonspecific intraventricular block: Secondary | ICD-10-CM | POA: Diagnosis not present

## 2017-04-07 DIAGNOSIS — I959 Hypotension, unspecified: Secondary | ICD-10-CM | POA: Diagnosis not present

## 2017-04-07 DIAGNOSIS — I441 Atrioventricular block, second degree: Secondary | ICD-10-CM | POA: Diagnosis not present

## 2017-04-07 DIAGNOSIS — I2510 Atherosclerotic heart disease of native coronary artery without angina pectoris: Secondary | ICD-10-CM | POA: Diagnosis present

## 2017-04-07 DIAGNOSIS — I4891 Unspecified atrial fibrillation: Secondary | ICD-10-CM | POA: Diagnosis not present

## 2017-04-07 DIAGNOSIS — Z95810 Presence of automatic (implantable) cardiac defibrillator: Secondary | ICD-10-CM | POA: Diagnosis not present

## 2017-04-07 DIAGNOSIS — R079 Chest pain, unspecified: Secondary | ICD-10-CM | POA: Diagnosis not present

## 2017-04-07 DIAGNOSIS — Z794 Long term (current) use of insulin: Secondary | ICD-10-CM | POA: Diagnosis not present

## 2017-04-07 DIAGNOSIS — N179 Acute kidney failure, unspecified: Secondary | ICD-10-CM | POA: Diagnosis not present

## 2017-04-07 DIAGNOSIS — E118 Type 2 diabetes mellitus with unspecified complications: Secondary | ICD-10-CM | POA: Diagnosis not present

## 2017-04-07 DIAGNOSIS — M159 Polyosteoarthritis, unspecified: Secondary | ICD-10-CM | POA: Diagnosis present

## 2017-04-07 DIAGNOSIS — I214 Non-ST elevation (NSTEMI) myocardial infarction: Secondary | ICD-10-CM | POA: Diagnosis not present

## 2017-04-07 DIAGNOSIS — N189 Chronic kidney disease, unspecified: Secondary | ICD-10-CM | POA: Diagnosis not present

## 2017-04-07 DIAGNOSIS — R000 Tachycardia, unspecified: Secondary | ICD-10-CM | POA: Diagnosis not present

## 2017-04-07 DIAGNOSIS — I272 Pulmonary hypertension, unspecified: Secondary | ICD-10-CM | POA: Diagnosis not present

## 2017-04-07 DIAGNOSIS — Z7982 Long term (current) use of aspirin: Secondary | ICD-10-CM | POA: Diagnosis not present

## 2017-04-07 DIAGNOSIS — I255 Ischemic cardiomyopathy: Secondary | ICD-10-CM | POA: Diagnosis not present

## 2017-04-07 DIAGNOSIS — I493 Ventricular premature depolarization: Secondary | ICD-10-CM | POA: Diagnosis not present

## 2017-04-07 DIAGNOSIS — E1122 Type 2 diabetes mellitus with diabetic chronic kidney disease: Secondary | ICD-10-CM | POA: Diagnosis not present

## 2017-04-07 DIAGNOSIS — N183 Chronic kidney disease, stage 3 (moderate): Secondary | ICD-10-CM | POA: Diagnosis not present

## 2017-04-08 DIAGNOSIS — R748 Abnormal levels of other serum enzymes: Secondary | ICD-10-CM | POA: Diagnosis not present

## 2017-04-08 DIAGNOSIS — R000 Tachycardia, unspecified: Secondary | ICD-10-CM | POA: Diagnosis not present

## 2017-04-08 DIAGNOSIS — R079 Chest pain, unspecified: Secondary | ICD-10-CM | POA: Diagnosis not present

## 2017-04-14 DIAGNOSIS — Z6837 Body mass index (BMI) 37.0-37.9, adult: Secondary | ICD-10-CM | POA: Diagnosis not present

## 2017-04-14 DIAGNOSIS — M130 Polyarthritis, unspecified: Secondary | ICD-10-CM | POA: Diagnosis not present

## 2017-04-14 DIAGNOSIS — E669 Obesity, unspecified: Secondary | ICD-10-CM | POA: Diagnosis not present

## 2017-04-14 DIAGNOSIS — E790 Hyperuricemia without signs of inflammatory arthritis and tophaceous disease: Secondary | ICD-10-CM | POA: Diagnosis not present

## 2017-04-14 DIAGNOSIS — M1A9XX1 Chronic gout, unspecified, with tophus (tophi): Secondary | ICD-10-CM | POA: Diagnosis not present

## 2017-04-21 DIAGNOSIS — H9201 Otalgia, right ear: Secondary | ICD-10-CM | POA: Diagnosis not present

## 2017-04-21 DIAGNOSIS — H00021 Hordeolum internum right upper eyelid: Secondary | ICD-10-CM | POA: Diagnosis not present

## 2017-04-26 ENCOUNTER — Telehealth: Payer: Self-pay | Admitting: *Deleted

## 2017-04-26 NOTE — Telephone Encounter (Addendum)
LMOVM requesting call back to the [facility].  Gave direct number for return call.  ICD at [facility] as of 04/24/17 per alert received via Home Monitoring.

## 2017-04-28 NOTE — Telephone Encounter (Signed)
Spoke with patient, advised him of ICD at [facility] as of 04/24/17.  He is aware that I will forward this message to a scheduler to get him set-up for an appointment.  Patient reports he is currently residing in [facility] and requests an EP there if possible.  He has previously been seen by Dr. Curt Bears at the [facility] office.  Patient is appreciative of call and denies additional questions or concerns at this time.

## 2017-04-29 DIAGNOSIS — E8770 Fluid overload, unspecified: Secondary | ICD-10-CM | POA: Diagnosis not present

## 2017-04-29 DIAGNOSIS — N184 Chronic kidney disease, stage 4 (severe): Secondary | ICD-10-CM | POA: Diagnosis not present

## 2017-04-29 DIAGNOSIS — R05 Cough: Secondary | ICD-10-CM | POA: Diagnosis not present

## 2017-04-29 DIAGNOSIS — Z794 Long term (current) use of insulin: Secondary | ICD-10-CM | POA: Diagnosis not present

## 2017-04-29 DIAGNOSIS — I5023 Acute on chronic systolic (congestive) heart failure: Secondary | ICD-10-CM | POA: Diagnosis not present

## 2017-04-29 DIAGNOSIS — E119 Type 2 diabetes mellitus without complications: Secondary | ICD-10-CM | POA: Diagnosis not present

## 2017-04-29 DIAGNOSIS — R079 Chest pain, unspecified: Secondary | ICD-10-CM | POA: Diagnosis not present

## 2017-04-29 DIAGNOSIS — Z833 Family history of diabetes mellitus: Secondary | ICD-10-CM | POA: Diagnosis not present

## 2017-04-29 DIAGNOSIS — Z8672 Personal history of thrombophlebitis: Secondary | ICD-10-CM | POA: Diagnosis not present

## 2017-04-29 DIAGNOSIS — Z951 Presence of aortocoronary bypass graft: Secondary | ICD-10-CM | POA: Diagnosis not present

## 2017-04-29 DIAGNOSIS — I4891 Unspecified atrial fibrillation: Secondary | ICD-10-CM | POA: Diagnosis not present

## 2017-04-29 DIAGNOSIS — I444 Left anterior fascicular block: Secondary | ICD-10-CM | POA: Diagnosis not present

## 2017-04-29 DIAGNOSIS — Z7901 Long term (current) use of anticoagulants: Secondary | ICD-10-CM | POA: Diagnosis not present

## 2017-04-29 DIAGNOSIS — R600 Localized edema: Secondary | ICD-10-CM | POA: Diagnosis not present

## 2017-04-29 DIAGNOSIS — I1310 Hypertensive heart and chronic kidney disease without heart failure, with stage 1 through stage 4 chronic kidney disease, or unspecified chronic kidney disease: Secondary | ICD-10-CM | POA: Diagnosis not present

## 2017-04-29 DIAGNOSIS — I255 Ischemic cardiomyopathy: Secondary | ICD-10-CM | POA: Diagnosis present

## 2017-04-29 DIAGNOSIS — E1122 Type 2 diabetes mellitus with diabetic chronic kidney disease: Secondary | ICD-10-CM | POA: Diagnosis not present

## 2017-04-29 DIAGNOSIS — R740 Nonspecific elevation of levels of transaminase and lactic acid dehydrogenase [LDH]: Secondary | ICD-10-CM | POA: Diagnosis not present

## 2017-04-29 DIAGNOSIS — R7989 Other specified abnormal findings of blood chemistry: Secondary | ICD-10-CM | POA: Diagnosis not present

## 2017-04-29 DIAGNOSIS — I25810 Atherosclerosis of coronary artery bypass graft(s) without angina pectoris: Secondary | ICD-10-CM | POA: Diagnosis not present

## 2017-04-29 DIAGNOSIS — Z8249 Family history of ischemic heart disease and other diseases of the circulatory system: Secondary | ICD-10-CM | POA: Diagnosis not present

## 2017-04-29 DIAGNOSIS — M109 Gout, unspecified: Secondary | ICD-10-CM | POA: Diagnosis present

## 2017-04-29 DIAGNOSIS — N179 Acute kidney failure, unspecified: Secondary | ICD-10-CM | POA: Diagnosis not present

## 2017-04-29 DIAGNOSIS — I130 Hypertensive heart and chronic kidney disease with heart failure and stage 1 through stage 4 chronic kidney disease, or unspecified chronic kidney disease: Secondary | ICD-10-CM | POA: Diagnosis not present

## 2017-04-29 DIAGNOSIS — M25551 Pain in right hip: Secondary | ICD-10-CM | POA: Diagnosis not present

## 2017-04-29 DIAGNOSIS — I5022 Chronic systolic (congestive) heart failure: Secondary | ICD-10-CM | POA: Diagnosis not present

## 2017-04-29 DIAGNOSIS — I482 Chronic atrial fibrillation: Secondary | ICD-10-CM | POA: Diagnosis present

## 2017-04-29 DIAGNOSIS — Z7982 Long term (current) use of aspirin: Secondary | ICD-10-CM | POA: Diagnosis not present

## 2017-04-29 DIAGNOSIS — N189 Chronic kidney disease, unspecified: Secondary | ICD-10-CM | POA: Diagnosis not present

## 2017-04-29 DIAGNOSIS — Z955 Presence of coronary angioplasty implant and graft: Secondary | ICD-10-CM | POA: Diagnosis not present

## 2017-04-29 DIAGNOSIS — I2510 Atherosclerotic heart disease of native coronary artery without angina pectoris: Secondary | ICD-10-CM | POA: Diagnosis not present

## 2017-04-29 DIAGNOSIS — D649 Anemia, unspecified: Secondary | ICD-10-CM | POA: Diagnosis not present

## 2017-04-29 DIAGNOSIS — I5020 Unspecified systolic (congestive) heart failure: Secondary | ICD-10-CM | POA: Diagnosis not present

## 2017-04-29 DIAGNOSIS — I272 Pulmonary hypertension, unspecified: Secondary | ICD-10-CM | POA: Diagnosis present

## 2017-04-29 DIAGNOSIS — L97411 Non-pressure chronic ulcer of right heel and midfoot limited to breakdown of skin: Secondary | ICD-10-CM | POA: Diagnosis not present

## 2017-04-29 DIAGNOSIS — R093 Abnormal sputum: Secondary | ICD-10-CM | POA: Diagnosis not present

## 2017-04-29 DIAGNOSIS — N183 Chronic kidney disease, stage 3 (moderate): Secondary | ICD-10-CM | POA: Diagnosis not present

## 2017-04-29 DIAGNOSIS — I4892 Unspecified atrial flutter: Secondary | ICD-10-CM | POA: Diagnosis not present

## 2017-04-29 DIAGNOSIS — K761 Chronic passive congestion of liver: Secondary | ICD-10-CM | POA: Diagnosis present

## 2017-04-29 DIAGNOSIS — I248 Other forms of acute ischemic heart disease: Secondary | ICD-10-CM | POA: Diagnosis not present

## 2017-04-29 DIAGNOSIS — Z95810 Presence of automatic (implantable) cardiac defibrillator: Secondary | ICD-10-CM | POA: Diagnosis not present

## 2017-04-29 DIAGNOSIS — I5021 Acute systolic (congestive) heart failure: Secondary | ICD-10-CM | POA: Diagnosis not present

## 2017-04-29 DIAGNOSIS — E11621 Type 2 diabetes mellitus with foot ulcer: Secondary | ICD-10-CM | POA: Diagnosis not present

## 2017-04-29 DIAGNOSIS — R748 Abnormal levels of other serum enzymes: Secondary | ICD-10-CM | POA: Diagnosis not present

## 2017-04-29 DIAGNOSIS — L97412 Non-pressure chronic ulcer of right heel and midfoot with fat layer exposed: Secondary | ICD-10-CM | POA: Diagnosis present

## 2017-04-29 DIAGNOSIS — R0602 Shortness of breath: Secondary | ICD-10-CM | POA: Diagnosis not present

## 2017-04-29 DIAGNOSIS — I452 Bifascicular block: Secondary | ICD-10-CM | POA: Diagnosis not present

## 2017-04-29 DIAGNOSIS — I10 Essential (primary) hypertension: Secondary | ICD-10-CM | POA: Diagnosis not present

## 2017-04-29 DIAGNOSIS — Z79899 Other long term (current) drug therapy: Secondary | ICD-10-CM | POA: Diagnosis not present

## 2017-05-18 ENCOUNTER — Telehealth: Payer: Self-pay | Admitting: *Deleted

## 2017-05-18 NOTE — Telephone Encounter (Signed)
Paul [name] came to me and asked if I could speak with the patient and his wife. The call wasn't successfully transferred to my line the first time so I called the patient's cell back and left a voicemail. Paul [name] then realized the patient was holding on her line still (instead of parked in the queue) so she then transferred the call to me.  When the call came through I was on speaker phone with Mr. And Paul [name] barely let me speak as she expressed her frustration with Paul [name]'s hesitation to speak with her. I tried to explain to Paul [name] that Paul [name] must list her on the DPR as a party that we can disclose information to. I let her know that the DPR is a legal document that protects patient's health information and Paul [name] was following her training for obtain confidentiality. Mr. Weckwerth said he couldn't remember who he had listed on the DPR and asked me to disclose that information to which I did - he only listed him mother.  Paul [name] was very upset and insisted that Paul [name] would not be coming back to our office. She wanted to speak with my leader. I provided Paul [name] with Paul [name]'s direct office line.

## 2017-05-18 NOTE — Telephone Encounter (Signed)
Alert received for ERI and long ATR episodes. Attempted to call patient to schedule him an appt with Dr.Camnitz for tomorrow @ 1400. Patient's "wife" answered the phone. I asked wife if I could speak to patient about making an appointment. Wife stated that I would need to make the appointment with her. I checked the DPR on file to see if it was ok to speak to the wife. Patient's mother was the only one listed on patient's DPR. Wife grew irritated with the time it was taking for me to verify her on the [facility]. I told her that I would need the patient to give me permission to speak to her further since her name was not on the DPR. Wife stated that she wanted to speak to a supervisor. I attempted to find Janan Halter, RN to discuss the matter with the wife, but she was not available. The patient's phone rang back to my phone, at this point the patient's phone was on speaker and I was able to talk to the patient. I explained to the patient that I could make the appointment with his wife if he gave me permission. Permission from the patient was given. I offered them an appointment with Dr.Camnitz in [place] for tomorrow at 1400. Patient stated that he was unable to make it at 1400 bc he got off work at 1400. I told him that I would have the scheduler to call him about setting up an appointment for a later date. Wife stated that she still wanted to speak to a supervisor, so I spoke with Lillia Pauls about the situation and Crystal spoke to the patient and his wife.

## 2017-05-20 NOTE — Telephone Encounter (Signed)
See phone note from 05/18/17.

## 2017-05-21 ENCOUNTER — Telehealth: Payer: Self-pay | Admitting: Cardiology

## 2017-05-21 NOTE — Telephone Encounter (Signed)
05-20-17 I called pt today on a follow up call from 05-07-17. I received a message from Levander Campion 04-28-17 to make appt for pt to see Camnitz in [facility] as he reached ERI 04-24-17. When I called on 05-07-17, pt stated he would have to call me back as he was on his way to meet his wife who was on her way to the ER in [facility]. I told him that would be fine and I hoped everything was ok with his wife, he thanked me and ended the call. As a follow up 05-20-17, I checked to see if pt made an appt, he had not, so I called him to get him scheduled. The pt's wife answered the phone, before I could say anything, she was  yelling at me that I was not to call them ever again, that my call was considered harrassment, I said mam this is a doctor's office, as I was so confused as to what was going on, I thought she was thinking I was someone else. I had not read the previous telephone message stating they wouldn't be back, because I was following up from a staff message where I had documented my previous call. She then stated I know who you are, and you were not suppose to call me, If you call again I will call my attorney, she said I can put my husband on speaker phone and he can tell you the same thing. I had been apologizing through out this conversation when I could actually get a word out, I tried to explain I was following up on my call, and was not aware of the previous calls. She was actually yelling so loud, Sherri in [place], came in my office to see what was going on and to see if she could help in anyway. I apologized again and ended the call. Sherri will address with Dr. Curt Bears and make him aware of the situation when he returns to the office 05-26-17.

## 2017-05-27 ENCOUNTER — Encounter: Payer: Self-pay | Admitting: Cardiology

## 2017-05-28 NOTE — Telephone Encounter (Signed)
05-27-17 After discussing this incident with Dr. Curt Bears, we decided to send the patient a letter asking him to make an appt asap either with Camnitz in [place] or another EP elsewhere. I asked them to let us know if he gets another EP so we could forward his medical records. The letter was sent certified.

## 2017-06-01 DIAGNOSIS — M25541 Pain in joints of right hand: Secondary | ICD-10-CM | POA: Diagnosis not present

## 2017-06-01 DIAGNOSIS — M25442 Effusion, left hand: Secondary | ICD-10-CM | POA: Diagnosis not present

## 2017-06-01 DIAGNOSIS — N289 Disorder of kidney and ureter, unspecified: Secondary | ICD-10-CM | POA: Diagnosis not present

## 2017-06-01 DIAGNOSIS — M25542 Pain in joints of left hand: Secondary | ICD-10-CM | POA: Diagnosis not present

## 2017-06-01 DIAGNOSIS — M1A39X1 Chronic gout due to renal impairment, multiple sites, with tophus (tophi): Secondary | ICD-10-CM | POA: Diagnosis not present

## 2017-06-01 DIAGNOSIS — M25441 Effusion, right hand: Secondary | ICD-10-CM | POA: Diagnosis not present

## 2017-06-15 DIAGNOSIS — Z95810 Presence of automatic (implantable) cardiac defibrillator: Secondary | ICD-10-CM | POA: Diagnosis not present

## 2017-06-15 DIAGNOSIS — I4892 Unspecified atrial flutter: Secondary | ICD-10-CM | POA: Diagnosis not present

## 2017-06-15 DIAGNOSIS — Z7901 Long term (current) use of anticoagulants: Secondary | ICD-10-CM | POA: Diagnosis not present

## 2017-06-15 DIAGNOSIS — N183 Chronic kidney disease, stage 3 (moderate): Secondary | ICD-10-CM | POA: Diagnosis not present

## 2017-06-15 DIAGNOSIS — I480 Paroxysmal atrial fibrillation: Secondary | ICD-10-CM | POA: Diagnosis not present

## 2017-06-15 DIAGNOSIS — I130 Hypertensive heart and chronic kidney disease with heart failure and stage 1 through stage 4 chronic kidney disease, or unspecified chronic kidney disease: Secondary | ICD-10-CM | POA: Diagnosis not present

## 2017-06-15 DIAGNOSIS — I5022 Chronic systolic (congestive) heart failure: Secondary | ICD-10-CM | POA: Diagnosis not present

## 2017-06-16 DIAGNOSIS — I444 Left anterior fascicular block: Secondary | ICD-10-CM | POA: Diagnosis not present

## 2017-06-16 DIAGNOSIS — I4510 Unspecified right bundle-branch block: Secondary | ICD-10-CM | POA: Diagnosis not present

## 2017-06-16 DIAGNOSIS — I228 Subsequent ST elevation (STEMI) myocardial infarction of other sites: Secondary | ICD-10-CM | POA: Diagnosis not present

## 2017-06-16 DIAGNOSIS — I441 Atrioventricular block, second degree: Secondary | ICD-10-CM | POA: Diagnosis not present

## 2017-06-16 DIAGNOSIS — I452 Bifascicular block: Secondary | ICD-10-CM | POA: Diagnosis not present

## 2017-06-23 DIAGNOSIS — M10322 Gout due to renal impairment, left elbow: Secondary | ICD-10-CM | POA: Diagnosis not present

## 2017-06-23 DIAGNOSIS — N3001 Acute cystitis with hematuria: Secondary | ICD-10-CM | POA: Diagnosis not present

## 2017-06-23 DIAGNOSIS — B9620 Unspecified Escherichia coli [E. coli] as the cause of diseases classified elsewhere: Secondary | ICD-10-CM | POA: Diagnosis not present

## 2017-06-23 DIAGNOSIS — R7989 Other specified abnormal findings of blood chemistry: Secondary | ICD-10-CM | POA: Diagnosis not present

## 2017-06-23 DIAGNOSIS — M10341 Gout due to renal impairment, right hand: Secondary | ICD-10-CM | POA: Diagnosis not present

## 2017-06-23 DIAGNOSIS — R1110 Vomiting, unspecified: Secondary | ICD-10-CM | POA: Diagnosis not present

## 2017-06-23 DIAGNOSIS — R05 Cough: Secondary | ICD-10-CM | POA: Diagnosis not present

## 2017-06-23 DIAGNOSIS — I5022 Chronic systolic (congestive) heart failure: Secondary | ICD-10-CM | POA: Diagnosis not present

## 2017-06-23 DIAGNOSIS — N183 Chronic kidney disease, stage 3 (moderate): Secondary | ICD-10-CM | POA: Diagnosis not present

## 2017-06-23 DIAGNOSIS — I480 Paroxysmal atrial fibrillation: Secondary | ICD-10-CM | POA: Diagnosis not present

## 2017-06-23 DIAGNOSIS — M7989 Other specified soft tissue disorders: Secondary | ICD-10-CM | POA: Diagnosis not present

## 2017-06-23 DIAGNOSIS — I129 Hypertensive chronic kidney disease with stage 1 through stage 4 chronic kidney disease, or unspecified chronic kidney disease: Secondary | ICD-10-CM | POA: Diagnosis not present

## 2017-06-23 DIAGNOSIS — E1122 Type 2 diabetes mellitus with diabetic chronic kidney disease: Secondary | ICD-10-CM | POA: Diagnosis not present

## 2017-06-23 DIAGNOSIS — Z01812 Encounter for preprocedural laboratory examination: Secondary | ICD-10-CM | POA: Diagnosis not present

## 2017-06-28 DIAGNOSIS — Z8673 Personal history of transient ischemic attack (TIA), and cerebral infarction without residual deficits: Secondary | ICD-10-CM | POA: Diagnosis not present

## 2017-06-28 DIAGNOSIS — I4892 Unspecified atrial flutter: Secondary | ICD-10-CM | POA: Diagnosis not present

## 2017-06-28 DIAGNOSIS — R0989 Other specified symptoms and signs involving the circulatory and respiratory systems: Secondary | ICD-10-CM | POA: Diagnosis not present

## 2017-06-28 DIAGNOSIS — I130 Hypertensive heart and chronic kidney disease with heart failure and stage 1 through stage 4 chronic kidney disease, or unspecified chronic kidney disease: Secondary | ICD-10-CM | POA: Diagnosis not present

## 2017-06-28 DIAGNOSIS — I255 Ischemic cardiomyopathy: Secondary | ICD-10-CM | POA: Diagnosis not present

## 2017-06-28 DIAGNOSIS — N183 Chronic kidney disease, stage 3 (moderate): Secondary | ICD-10-CM | POA: Diagnosis not present

## 2017-06-28 DIAGNOSIS — I480 Paroxysmal atrial fibrillation: Secondary | ICD-10-CM | POA: Diagnosis not present

## 2017-06-28 DIAGNOSIS — I501 Left ventricular failure: Secondary | ICD-10-CM | POA: Diagnosis not present

## 2017-06-28 DIAGNOSIS — I5022 Chronic systolic (congestive) heart failure: Secondary | ICD-10-CM | POA: Diagnosis not present

## 2017-06-28 DIAGNOSIS — E1122 Type 2 diabetes mellitus with diabetic chronic kidney disease: Secondary | ICD-10-CM | POA: Diagnosis not present

## 2017-06-28 DIAGNOSIS — I4510 Unspecified right bundle-branch block: Secondary | ICD-10-CM | POA: Diagnosis not present

## 2017-06-28 DIAGNOSIS — Z794 Long term (current) use of insulin: Secondary | ICD-10-CM | POA: Diagnosis not present

## 2017-06-28 DIAGNOSIS — M109 Gout, unspecified: Secondary | ICD-10-CM | POA: Diagnosis not present

## 2017-06-29 DIAGNOSIS — M109 Gout, unspecified: Secondary | ICD-10-CM | POA: Diagnosis not present

## 2017-06-29 DIAGNOSIS — N183 Chronic kidney disease, stage 3 (moderate): Secondary | ICD-10-CM | POA: Diagnosis not present

## 2017-06-29 DIAGNOSIS — I130 Hypertensive heart and chronic kidney disease with heart failure and stage 1 through stage 4 chronic kidney disease, or unspecified chronic kidney disease: Secondary | ICD-10-CM | POA: Diagnosis not present

## 2017-06-29 DIAGNOSIS — I480 Paroxysmal atrial fibrillation: Secondary | ICD-10-CM | POA: Diagnosis not present

## 2017-06-29 DIAGNOSIS — I5022 Chronic systolic (congestive) heart failure: Secondary | ICD-10-CM | POA: Diagnosis not present

## 2017-06-29 DIAGNOSIS — E1122 Type 2 diabetes mellitus with diabetic chronic kidney disease: Secondary | ICD-10-CM | POA: Diagnosis not present

## 2017-07-06 DIAGNOSIS — I4892 Unspecified atrial flutter: Secondary | ICD-10-CM | POA: Diagnosis not present

## 2017-07-06 DIAGNOSIS — Z4502 Encounter for adjustment and management of automatic implantable cardiac defibrillator: Secondary | ICD-10-CM | POA: Diagnosis not present

## 2017-07-08 DIAGNOSIS — E790 Hyperuricemia without signs of inflammatory arthritis and tophaceous disease: Secondary | ICD-10-CM | POA: Diagnosis not present

## 2017-07-08 DIAGNOSIS — N189 Chronic kidney disease, unspecified: Secondary | ICD-10-CM | POA: Diagnosis not present

## 2017-07-08 DIAGNOSIS — Z794 Long term (current) use of insulin: Secondary | ICD-10-CM | POA: Diagnosis not present

## 2017-07-08 DIAGNOSIS — R202 Paresthesia of skin: Secondary | ICD-10-CM | POA: Diagnosis not present

## 2017-07-08 DIAGNOSIS — E1122 Type 2 diabetes mellitus with diabetic chronic kidney disease: Secondary | ICD-10-CM | POA: Diagnosis not present

## 2017-07-08 DIAGNOSIS — M7989 Other specified soft tissue disorders: Secondary | ICD-10-CM | POA: Diagnosis not present

## 2017-07-08 DIAGNOSIS — R200 Anesthesia of skin: Secondary | ICD-10-CM | POA: Diagnosis not present

## 2017-07-08 DIAGNOSIS — I509 Heart failure, unspecified: Secondary | ICD-10-CM | POA: Diagnosis not present

## 2017-07-12 DIAGNOSIS — Z794 Long term (current) use of insulin: Secondary | ICD-10-CM | POA: Diagnosis not present

## 2017-07-12 DIAGNOSIS — I10 Essential (primary) hypertension: Secondary | ICD-10-CM | POA: Diagnosis not present

## 2017-07-12 DIAGNOSIS — E118 Type 2 diabetes mellitus with unspecified complications: Secondary | ICD-10-CM | POA: Diagnosis not present

## 2017-07-12 DIAGNOSIS — N184 Chronic kidney disease, stage 4 (severe): Secondary | ICD-10-CM | POA: Diagnosis not present

## 2017-07-12 DIAGNOSIS — M79642 Pain in left hand: Secondary | ICD-10-CM | POA: Diagnosis not present

## 2017-07-12 DIAGNOSIS — I130 Hypertensive heart and chronic kidney disease with heart failure and stage 1 through stage 4 chronic kidney disease, or unspecified chronic kidney disease: Secondary | ICD-10-CM | POA: Diagnosis not present

## 2017-07-12 DIAGNOSIS — G4733 Obstructive sleep apnea (adult) (pediatric): Secondary | ICD-10-CM | POA: Diagnosis not present

## 2017-07-12 DIAGNOSIS — M79641 Pain in right hand: Secondary | ICD-10-CM | POA: Diagnosis not present

## 2017-07-12 DIAGNOSIS — I509 Heart failure, unspecified: Secondary | ICD-10-CM | POA: Diagnosis not present

## 2017-07-12 DIAGNOSIS — M1A0490 Idiopathic chronic gout, unspecified hand, without tophus (tophi): Secondary | ICD-10-CM | POA: Diagnosis not present

## 2017-07-12 DIAGNOSIS — E1122 Type 2 diabetes mellitus with diabetic chronic kidney disease: Secondary | ICD-10-CM | POA: Diagnosis not present

## 2017-07-12 DIAGNOSIS — Z7689 Persons encountering health services in other specified circumstances: Secondary | ICD-10-CM | POA: Diagnosis not present

## 2017-07-12 DIAGNOSIS — Z125 Encounter for screening for malignant neoplasm of prostate: Secondary | ICD-10-CM | POA: Diagnosis not present

## 2017-07-16 DIAGNOSIS — Z01818 Encounter for other preprocedural examination: Secondary | ICD-10-CM | POA: Diagnosis not present

## 2017-07-21 DIAGNOSIS — Z794 Long term (current) use of insulin: Secondary | ICD-10-CM | POA: Diagnosis not present

## 2017-07-21 DIAGNOSIS — I483 Typical atrial flutter: Secondary | ICD-10-CM | POA: Diagnosis not present

## 2017-07-21 DIAGNOSIS — N183 Chronic kidney disease, stage 3 (moderate): Secondary | ICD-10-CM | POA: Diagnosis not present

## 2017-07-21 DIAGNOSIS — Z8673 Personal history of transient ischemic attack (TIA), and cerebral infarction without residual deficits: Secondary | ICD-10-CM | POA: Diagnosis not present

## 2017-07-21 DIAGNOSIS — Z95810 Presence of automatic (implantable) cardiac defibrillator: Secondary | ICD-10-CM | POA: Diagnosis not present

## 2017-07-21 DIAGNOSIS — Z951 Presence of aortocoronary bypass graft: Secondary | ICD-10-CM | POA: Diagnosis not present

## 2017-07-21 DIAGNOSIS — I130 Hypertensive heart and chronic kidney disease with heart failure and stage 1 through stage 4 chronic kidney disease, or unspecified chronic kidney disease: Secondary | ICD-10-CM | POA: Diagnosis not present

## 2017-07-21 DIAGNOSIS — I5022 Chronic systolic (congestive) heart failure: Secondary | ICD-10-CM | POA: Diagnosis not present

## 2017-07-21 DIAGNOSIS — E1122 Type 2 diabetes mellitus with diabetic chronic kidney disease: Secondary | ICD-10-CM | POA: Diagnosis not present

## 2017-07-22 DIAGNOSIS — E119 Type 2 diabetes mellitus without complications: Secondary | ICD-10-CM | POA: Diagnosis not present

## 2017-07-22 DIAGNOSIS — I5022 Chronic systolic (congestive) heart failure: Secondary | ICD-10-CM | POA: Diagnosis not present

## 2017-07-22 DIAGNOSIS — N183 Chronic kidney disease, stage 3 (moderate): Secondary | ICD-10-CM | POA: Diagnosis not present

## 2017-07-22 DIAGNOSIS — E1122 Type 2 diabetes mellitus with diabetic chronic kidney disease: Secondary | ICD-10-CM | POA: Diagnosis not present

## 2017-07-22 DIAGNOSIS — I130 Hypertensive heart and chronic kidney disease with heart failure and stage 1 through stage 4 chronic kidney disease, or unspecified chronic kidney disease: Secondary | ICD-10-CM | POA: Diagnosis not present

## 2017-07-22 DIAGNOSIS — I255 Ischemic cardiomyopathy: Secondary | ICD-10-CM | POA: Diagnosis not present

## 2017-07-22 DIAGNOSIS — Z794 Long term (current) use of insulin: Secondary | ICD-10-CM | POA: Diagnosis not present

## 2017-07-22 DIAGNOSIS — I483 Typical atrial flutter: Secondary | ICD-10-CM | POA: Diagnosis not present

## 2017-07-22 DIAGNOSIS — Z95810 Presence of automatic (implantable) cardiac defibrillator: Secondary | ICD-10-CM | POA: Diagnosis not present

## 2017-07-26 DIAGNOSIS — M25442 Effusion, left hand: Secondary | ICD-10-CM | POA: Diagnosis not present

## 2017-07-26 DIAGNOSIS — M79642 Pain in left hand: Secondary | ICD-10-CM | POA: Diagnosis not present

## 2017-07-26 DIAGNOSIS — M25441 Effusion, right hand: Secondary | ICD-10-CM | POA: Diagnosis not present

## 2017-07-26 DIAGNOSIS — M79641 Pain in right hand: Secondary | ICD-10-CM | POA: Diagnosis not present

## 2017-07-28 DIAGNOSIS — M79642 Pain in left hand: Secondary | ICD-10-CM | POA: Diagnosis not present

## 2017-07-28 DIAGNOSIS — G5603 Carpal tunnel syndrome, bilateral upper limbs: Secondary | ICD-10-CM | POA: Diagnosis not present

## 2017-07-28 DIAGNOSIS — M1A0490 Idiopathic chronic gout, unspecified hand, without tophus (tophi): Secondary | ICD-10-CM | POA: Diagnosis not present

## 2017-07-28 DIAGNOSIS — M79641 Pain in right hand: Secondary | ICD-10-CM | POA: Diagnosis not present

## 2017-07-28 DIAGNOSIS — G5623 Lesion of ulnar nerve, bilateral upper limbs: Secondary | ICD-10-CM | POA: Diagnosis not present

## 2017-08-09 DIAGNOSIS — N184 Chronic kidney disease, stage 4 (severe): Secondary | ICD-10-CM | POA: Diagnosis not present

## 2017-08-09 DIAGNOSIS — E118 Type 2 diabetes mellitus with unspecified complications: Secondary | ICD-10-CM | POA: Diagnosis not present

## 2017-08-09 DIAGNOSIS — E11621 Type 2 diabetes mellitus with foot ulcer: Secondary | ICD-10-CM | POA: Diagnosis not present

## 2017-08-09 DIAGNOSIS — L97519 Non-pressure chronic ulcer of other part of right foot with unspecified severity: Secondary | ICD-10-CM | POA: Diagnosis not present

## 2017-08-09 DIAGNOSIS — G5603 Carpal tunnel syndrome, bilateral upper limbs: Secondary | ICD-10-CM | POA: Diagnosis not present

## 2017-08-09 DIAGNOSIS — I5042 Chronic combined systolic (congestive) and diastolic (congestive) heart failure: Secondary | ICD-10-CM | POA: Diagnosis not present

## 2017-08-09 DIAGNOSIS — Z794 Long term (current) use of insulin: Secondary | ICD-10-CM | POA: Diagnosis not present

## 2017-08-12 DIAGNOSIS — E11621 Type 2 diabetes mellitus with foot ulcer: Secondary | ICD-10-CM | POA: Diagnosis not present

## 2017-08-12 DIAGNOSIS — E1142 Type 2 diabetes mellitus with diabetic polyneuropathy: Secondary | ICD-10-CM | POA: Diagnosis not present

## 2017-08-12 DIAGNOSIS — Z89421 Acquired absence of other right toe(s): Secondary | ICD-10-CM | POA: Diagnosis not present

## 2017-08-12 DIAGNOSIS — L97512 Non-pressure chronic ulcer of other part of right foot with fat layer exposed: Secondary | ICD-10-CM | POA: Diagnosis not present

## 2017-08-13 DIAGNOSIS — E118 Type 2 diabetes mellitus with unspecified complications: Secondary | ICD-10-CM | POA: Diagnosis not present

## 2017-08-13 DIAGNOSIS — Z95810 Presence of automatic (implantable) cardiac defibrillator: Secondary | ICD-10-CM | POA: Diagnosis not present

## 2017-08-13 DIAGNOSIS — Z951 Presence of aortocoronary bypass graft: Secondary | ICD-10-CM | POA: Diagnosis not present

## 2017-08-13 DIAGNOSIS — Z7901 Long term (current) use of anticoagulants: Secondary | ICD-10-CM | POA: Diagnosis not present

## 2017-08-13 DIAGNOSIS — Z794 Long term (current) use of insulin: Secondary | ICD-10-CM | POA: Diagnosis not present

## 2017-08-13 DIAGNOSIS — N184 Chronic kidney disease, stage 4 (severe): Secondary | ICD-10-CM | POA: Diagnosis not present

## 2017-08-13 DIAGNOSIS — E559 Vitamin D deficiency, unspecified: Secondary | ICD-10-CM | POA: Diagnosis not present

## 2017-08-13 DIAGNOSIS — I517 Cardiomegaly: Secondary | ICD-10-CM | POA: Diagnosis not present

## 2017-08-13 DIAGNOSIS — G4733 Obstructive sleep apnea (adult) (pediatric): Secondary | ICD-10-CM | POA: Diagnosis not present

## 2017-08-13 DIAGNOSIS — I4892 Unspecified atrial flutter: Secondary | ICD-10-CM | POA: Diagnosis not present

## 2017-08-13 DIAGNOSIS — I255 Ischemic cardiomyopathy: Secondary | ICD-10-CM | POA: Diagnosis not present

## 2017-08-13 DIAGNOSIS — I110 Hypertensive heart disease with heart failure: Secondary | ICD-10-CM | POA: Diagnosis not present

## 2017-08-13 DIAGNOSIS — I5042 Chronic combined systolic (congestive) and diastolic (congestive) heart failure: Secondary | ICD-10-CM | POA: Diagnosis not present

## 2017-08-13 DIAGNOSIS — I4510 Unspecified right bundle-branch block: Secondary | ICD-10-CM | POA: Diagnosis not present

## 2017-08-27 DIAGNOSIS — G5603 Carpal tunnel syndrome, bilateral upper limbs: Secondary | ICD-10-CM | POA: Diagnosis not present

## 2017-08-27 DIAGNOSIS — N184 Chronic kidney disease, stage 4 (severe): Secondary | ICD-10-CM | POA: Diagnosis not present

## 2017-08-27 DIAGNOSIS — Z794 Long term (current) use of insulin: Secondary | ICD-10-CM | POA: Diagnosis not present

## 2017-08-27 DIAGNOSIS — G5623 Lesion of ulnar nerve, bilateral upper limbs: Secondary | ICD-10-CM | POA: Diagnosis not present

## 2017-08-27 DIAGNOSIS — E1122 Type 2 diabetes mellitus with diabetic chronic kidney disease: Secondary | ICD-10-CM | POA: Diagnosis not present

## 2017-08-27 DIAGNOSIS — E1142 Type 2 diabetes mellitus with diabetic polyneuropathy: Secondary | ICD-10-CM | POA: Diagnosis not present

## 2017-08-28 DIAGNOSIS — M86171 Other acute osteomyelitis, right ankle and foot: Secondary | ICD-10-CM | POA: Diagnosis not present

## 2017-08-28 DIAGNOSIS — Z794 Long term (current) use of insulin: Secondary | ICD-10-CM | POA: Diagnosis not present

## 2017-08-28 DIAGNOSIS — M898X7 Other specified disorders of bone, ankle and foot: Secondary | ICD-10-CM | POA: Diagnosis not present

## 2017-08-28 DIAGNOSIS — M869 Osteomyelitis, unspecified: Secondary | ICD-10-CM | POA: Diagnosis not present

## 2017-08-28 DIAGNOSIS — Z7901 Long term (current) use of anticoagulants: Secondary | ICD-10-CM | POA: Diagnosis not present

## 2017-08-28 DIAGNOSIS — M868X7 Other osteomyelitis, ankle and foot: Secondary | ICD-10-CM | POA: Diagnosis not present

## 2017-08-28 DIAGNOSIS — B9561 Methicillin susceptible Staphylococcus aureus infection as the cause of diseases classified elsewhere: Secondary | ICD-10-CM | POA: Diagnosis not present

## 2017-08-28 DIAGNOSIS — D649 Anemia, unspecified: Secondary | ICD-10-CM | POA: Diagnosis not present

## 2017-08-28 DIAGNOSIS — Z9889 Other specified postprocedural states: Secondary | ICD-10-CM | POA: Diagnosis not present

## 2017-08-28 DIAGNOSIS — E875 Hyperkalemia: Secondary | ICD-10-CM | POA: Diagnosis not present

## 2017-08-28 DIAGNOSIS — I4892 Unspecified atrial flutter: Secondary | ICD-10-CM | POA: Diagnosis not present

## 2017-08-28 DIAGNOSIS — N179 Acute kidney failure, unspecified: Secondary | ICD-10-CM | POA: Diagnosis not present

## 2017-08-28 DIAGNOSIS — G4733 Obstructive sleep apnea (adult) (pediatric): Secondary | ICD-10-CM | POA: Diagnosis present

## 2017-08-28 DIAGNOSIS — Z95810 Presence of automatic (implantable) cardiac defibrillator: Secondary | ICD-10-CM | POA: Diagnosis not present

## 2017-08-28 DIAGNOSIS — I480 Paroxysmal atrial fibrillation: Secondary | ICD-10-CM | POA: Diagnosis not present

## 2017-08-28 DIAGNOSIS — E11621 Type 2 diabetes mellitus with foot ulcer: Secondary | ICD-10-CM | POA: Diagnosis not present

## 2017-08-28 DIAGNOSIS — Z951 Presence of aortocoronary bypass graft: Secondary | ICD-10-CM | POA: Diagnosis not present

## 2017-08-28 DIAGNOSIS — Z89421 Acquired absence of other right toe(s): Secondary | ICD-10-CM | POA: Diagnosis not present

## 2017-08-28 DIAGNOSIS — I5042 Chronic combined systolic (congestive) and diastolic (congestive) heart failure: Secondary | ICD-10-CM | POA: Diagnosis not present

## 2017-08-28 DIAGNOSIS — S92351A Displaced fracture of fifth metatarsal bone, right foot, initial encounter for closed fracture: Secondary | ICD-10-CM | POA: Diagnosis not present

## 2017-08-28 DIAGNOSIS — E118 Type 2 diabetes mellitus with unspecified complications: Secondary | ICD-10-CM | POA: Diagnosis not present

## 2017-08-28 DIAGNOSIS — I10 Essential (primary) hypertension: Secondary | ICD-10-CM | POA: Diagnosis not present

## 2017-08-28 DIAGNOSIS — I272 Pulmonary hypertension, unspecified: Secondary | ICD-10-CM | POA: Diagnosis present

## 2017-08-28 DIAGNOSIS — Z452 Encounter for adjustment and management of vascular access device: Secondary | ICD-10-CM | POA: Diagnosis not present

## 2017-08-28 DIAGNOSIS — L97514 Non-pressure chronic ulcer of other part of right foot with necrosis of bone: Secondary | ICD-10-CM | POA: Diagnosis not present

## 2017-08-28 DIAGNOSIS — E1169 Type 2 diabetes mellitus with other specified complication: Secondary | ICD-10-CM | POA: Diagnosis not present

## 2017-08-28 DIAGNOSIS — Z833 Family history of diabetes mellitus: Secondary | ICD-10-CM | POA: Diagnosis not present

## 2017-08-28 DIAGNOSIS — Z89429 Acquired absence of other toe(s), unspecified side: Secondary | ICD-10-CM | POA: Diagnosis not present

## 2017-08-28 DIAGNOSIS — I130 Hypertensive heart and chronic kidney disease with heart failure and stage 1 through stage 4 chronic kidney disease, or unspecified chronic kidney disease: Secondary | ICD-10-CM | POA: Diagnosis not present

## 2017-08-28 DIAGNOSIS — Z79899 Other long term (current) drug therapy: Secondary | ICD-10-CM | POA: Diagnosis not present

## 2017-08-28 DIAGNOSIS — Z8249 Family history of ischemic heart disease and other diseases of the circulatory system: Secondary | ICD-10-CM | POA: Diagnosis not present

## 2017-08-28 DIAGNOSIS — E1140 Type 2 diabetes mellitus with diabetic neuropathy, unspecified: Secondary | ICD-10-CM | POA: Diagnosis not present

## 2017-08-28 DIAGNOSIS — E1142 Type 2 diabetes mellitus with diabetic polyneuropathy: Secondary | ICD-10-CM | POA: Diagnosis not present

## 2017-08-28 DIAGNOSIS — I429 Cardiomyopathy, unspecified: Secondary | ICD-10-CM | POA: Diagnosis not present

## 2017-08-28 DIAGNOSIS — M86471 Chronic osteomyelitis with draining sinus, right ankle and foot: Secondary | ICD-10-CM | POA: Diagnosis not present

## 2017-08-28 DIAGNOSIS — E1152 Type 2 diabetes mellitus with diabetic peripheral angiopathy with gangrene: Secondary | ICD-10-CM | POA: Diagnosis not present

## 2017-08-28 DIAGNOSIS — B9620 Unspecified Escherichia coli [E. coli] as the cause of diseases classified elsewhere: Secondary | ICD-10-CM | POA: Diagnosis not present

## 2017-08-28 DIAGNOSIS — M86671 Other chronic osteomyelitis, right ankle and foot: Secondary | ICD-10-CM | POA: Diagnosis not present

## 2017-08-28 DIAGNOSIS — M109 Gout, unspecified: Secondary | ICD-10-CM | POA: Diagnosis not present

## 2017-08-28 DIAGNOSIS — E1122 Type 2 diabetes mellitus with diabetic chronic kidney disease: Secondary | ICD-10-CM | POA: Diagnosis not present

## 2017-08-28 DIAGNOSIS — I2510 Atherosclerotic heart disease of native coronary artery without angina pectoris: Secondary | ICD-10-CM | POA: Diagnosis present

## 2017-08-28 DIAGNOSIS — M25474 Effusion, right foot: Secondary | ICD-10-CM | POA: Diagnosis not present

## 2017-08-28 DIAGNOSIS — E119 Type 2 diabetes mellitus without complications: Secondary | ICD-10-CM | POA: Diagnosis not present

## 2017-08-28 DIAGNOSIS — N183 Chronic kidney disease, stage 3 (moderate): Secondary | ICD-10-CM | POA: Diagnosis not present

## 2017-08-28 DIAGNOSIS — N184 Chronic kidney disease, stage 4 (severe): Secondary | ICD-10-CM | POA: Diagnosis not present

## 2017-08-28 DIAGNOSIS — L97519 Non-pressure chronic ulcer of other part of right foot with unspecified severity: Secondary | ICD-10-CM | POA: Diagnosis not present

## 2017-09-06 DIAGNOSIS — E1169 Type 2 diabetes mellitus with other specified complication: Secondary | ICD-10-CM | POA: Diagnosis not present

## 2017-09-06 DIAGNOSIS — B9620 Unspecified Escherichia coli [E. coli] as the cause of diseases classified elsewhere: Secondary | ICD-10-CM | POA: Diagnosis not present

## 2017-09-06 DIAGNOSIS — Z792 Long term (current) use of antibiotics: Secondary | ICD-10-CM | POA: Diagnosis not present

## 2017-09-06 DIAGNOSIS — I2510 Atherosclerotic heart disease of native coronary artery without angina pectoris: Secondary | ICD-10-CM | POA: Diagnosis not present

## 2017-09-06 DIAGNOSIS — Z452 Encounter for adjustment and management of vascular access device: Secondary | ICD-10-CM | POA: Diagnosis not present

## 2017-09-06 DIAGNOSIS — B9561 Methicillin susceptible Staphylococcus aureus infection as the cause of diseases classified elsewhere: Secondary | ICD-10-CM | POA: Diagnosis not present

## 2017-09-06 DIAGNOSIS — M869 Osteomyelitis, unspecified: Secondary | ICD-10-CM | POA: Diagnosis not present

## 2017-09-06 DIAGNOSIS — Z5181 Encounter for therapeutic drug level monitoring: Secondary | ICD-10-CM | POA: Diagnosis not present

## 2017-09-06 DIAGNOSIS — B952 Enterococcus as the cause of diseases classified elsewhere: Secondary | ICD-10-CM | POA: Diagnosis not present

## 2017-09-06 DIAGNOSIS — Z4781 Encounter for orthopedic aftercare following surgical amputation: Secondary | ICD-10-CM | POA: Diagnosis not present

## 2017-09-07 DIAGNOSIS — Z4781 Encounter for orthopedic aftercare following surgical amputation: Secondary | ICD-10-CM | POA: Diagnosis not present

## 2017-09-07 DIAGNOSIS — B952 Enterococcus as the cause of diseases classified elsewhere: Secondary | ICD-10-CM | POA: Diagnosis not present

## 2017-09-07 DIAGNOSIS — B9561 Methicillin susceptible Staphylococcus aureus infection as the cause of diseases classified elsewhere: Secondary | ICD-10-CM | POA: Diagnosis not present

## 2017-09-07 DIAGNOSIS — B9620 Unspecified Escherichia coli [E. coli] as the cause of diseases classified elsewhere: Secondary | ICD-10-CM | POA: Diagnosis not present

## 2017-09-07 DIAGNOSIS — E1169 Type 2 diabetes mellitus with other specified complication: Secondary | ICD-10-CM | POA: Diagnosis not present

## 2017-09-07 DIAGNOSIS — M869 Osteomyelitis, unspecified: Secondary | ICD-10-CM | POA: Diagnosis not present

## 2017-09-08 DIAGNOSIS — B9561 Methicillin susceptible Staphylococcus aureus infection as the cause of diseases classified elsewhere: Secondary | ICD-10-CM | POA: Diagnosis not present

## 2017-09-08 DIAGNOSIS — Z4781 Encounter for orthopedic aftercare following surgical amputation: Secondary | ICD-10-CM | POA: Diagnosis not present

## 2017-09-08 DIAGNOSIS — B952 Enterococcus as the cause of diseases classified elsewhere: Secondary | ICD-10-CM | POA: Diagnosis not present

## 2017-09-08 DIAGNOSIS — B9620 Unspecified Escherichia coli [E. coli] as the cause of diseases classified elsewhere: Secondary | ICD-10-CM | POA: Diagnosis not present

## 2017-09-08 DIAGNOSIS — M869 Osteomyelitis, unspecified: Secondary | ICD-10-CM | POA: Diagnosis not present

## 2017-09-08 DIAGNOSIS — E1169 Type 2 diabetes mellitus with other specified complication: Secondary | ICD-10-CM | POA: Diagnosis not present

## 2017-09-09 DIAGNOSIS — B952 Enterococcus as the cause of diseases classified elsewhere: Secondary | ICD-10-CM | POA: Diagnosis not present

## 2017-09-09 DIAGNOSIS — E1169 Type 2 diabetes mellitus with other specified complication: Secondary | ICD-10-CM | POA: Diagnosis not present

## 2017-09-09 DIAGNOSIS — B9620 Unspecified Escherichia coli [E. coli] as the cause of diseases classified elsewhere: Secondary | ICD-10-CM | POA: Diagnosis not present

## 2017-09-09 DIAGNOSIS — B9561 Methicillin susceptible Staphylococcus aureus infection as the cause of diseases classified elsewhere: Secondary | ICD-10-CM | POA: Diagnosis not present

## 2017-09-09 DIAGNOSIS — M869 Osteomyelitis, unspecified: Secondary | ICD-10-CM | POA: Diagnosis not present

## 2017-09-09 DIAGNOSIS — Z4781 Encounter for orthopedic aftercare following surgical amputation: Secondary | ICD-10-CM | POA: Diagnosis not present

## 2017-09-10 DIAGNOSIS — B9561 Methicillin susceptible Staphylococcus aureus infection as the cause of diseases classified elsewhere: Secondary | ICD-10-CM | POA: Diagnosis not present

## 2017-09-10 DIAGNOSIS — E1169 Type 2 diabetes mellitus with other specified complication: Secondary | ICD-10-CM | POA: Diagnosis not present

## 2017-09-10 DIAGNOSIS — M869 Osteomyelitis, unspecified: Secondary | ICD-10-CM | POA: Diagnosis not present

## 2017-09-10 DIAGNOSIS — B9620 Unspecified Escherichia coli [E. coli] as the cause of diseases classified elsewhere: Secondary | ICD-10-CM | POA: Diagnosis not present

## 2017-09-10 DIAGNOSIS — Z4781 Encounter for orthopedic aftercare following surgical amputation: Secondary | ICD-10-CM | POA: Diagnosis not present

## 2017-09-10 DIAGNOSIS — B952 Enterococcus as the cause of diseases classified elsewhere: Secondary | ICD-10-CM | POA: Diagnosis not present

## 2017-09-11 DIAGNOSIS — B9620 Unspecified Escherichia coli [E. coli] as the cause of diseases classified elsewhere: Secondary | ICD-10-CM | POA: Diagnosis not present

## 2017-09-11 DIAGNOSIS — M869 Osteomyelitis, unspecified: Secondary | ICD-10-CM | POA: Diagnosis not present

## 2017-09-11 DIAGNOSIS — Z4781 Encounter for orthopedic aftercare following surgical amputation: Secondary | ICD-10-CM | POA: Diagnosis not present

## 2017-09-11 DIAGNOSIS — B952 Enterococcus as the cause of diseases classified elsewhere: Secondary | ICD-10-CM | POA: Diagnosis not present

## 2017-09-11 DIAGNOSIS — E1169 Type 2 diabetes mellitus with other specified complication: Secondary | ICD-10-CM | POA: Diagnosis not present

## 2017-09-11 DIAGNOSIS — B9561 Methicillin susceptible Staphylococcus aureus infection as the cause of diseases classified elsewhere: Secondary | ICD-10-CM | POA: Diagnosis not present

## 2017-09-12 DIAGNOSIS — B9561 Methicillin susceptible Staphylococcus aureus infection as the cause of diseases classified elsewhere: Secondary | ICD-10-CM | POA: Diagnosis not present

## 2017-09-12 DIAGNOSIS — M869 Osteomyelitis, unspecified: Secondary | ICD-10-CM | POA: Diagnosis not present

## 2017-09-12 DIAGNOSIS — B952 Enterococcus as the cause of diseases classified elsewhere: Secondary | ICD-10-CM | POA: Diagnosis not present

## 2017-09-12 DIAGNOSIS — Z4781 Encounter for orthopedic aftercare following surgical amputation: Secondary | ICD-10-CM | POA: Diagnosis not present

## 2017-09-12 DIAGNOSIS — B9620 Unspecified Escherichia coli [E. coli] as the cause of diseases classified elsewhere: Secondary | ICD-10-CM | POA: Diagnosis not present

## 2017-09-12 DIAGNOSIS — E1169 Type 2 diabetes mellitus with other specified complication: Secondary | ICD-10-CM | POA: Diagnosis not present

## 2017-09-13 DIAGNOSIS — E1169 Type 2 diabetes mellitus with other specified complication: Secondary | ICD-10-CM | POA: Diagnosis not present

## 2017-09-13 DIAGNOSIS — M869 Osteomyelitis, unspecified: Secondary | ICD-10-CM | POA: Diagnosis not present

## 2017-09-13 DIAGNOSIS — B9620 Unspecified Escherichia coli [E. coli] as the cause of diseases classified elsewhere: Secondary | ICD-10-CM | POA: Diagnosis not present

## 2017-09-13 DIAGNOSIS — Z4781 Encounter for orthopedic aftercare following surgical amputation: Secondary | ICD-10-CM | POA: Diagnosis not present

## 2017-09-13 DIAGNOSIS — B952 Enterococcus as the cause of diseases classified elsewhere: Secondary | ICD-10-CM | POA: Diagnosis not present

## 2017-09-13 DIAGNOSIS — B9561 Methicillin susceptible Staphylococcus aureus infection as the cause of diseases classified elsewhere: Secondary | ICD-10-CM | POA: Diagnosis not present

## 2017-09-14 DIAGNOSIS — B9620 Unspecified Escherichia coli [E. coli] as the cause of diseases classified elsewhere: Secondary | ICD-10-CM | POA: Diagnosis not present

## 2017-09-14 DIAGNOSIS — E1169 Type 2 diabetes mellitus with other specified complication: Secondary | ICD-10-CM | POA: Diagnosis not present

## 2017-09-14 DIAGNOSIS — B952 Enterococcus as the cause of diseases classified elsewhere: Secondary | ICD-10-CM | POA: Diagnosis not present

## 2017-09-14 DIAGNOSIS — B9561 Methicillin susceptible Staphylococcus aureus infection as the cause of diseases classified elsewhere: Secondary | ICD-10-CM | POA: Diagnosis not present

## 2017-09-14 DIAGNOSIS — M869 Osteomyelitis, unspecified: Secondary | ICD-10-CM | POA: Diagnosis not present

## 2017-09-14 DIAGNOSIS — Z4781 Encounter for orthopedic aftercare following surgical amputation: Secondary | ICD-10-CM | POA: Diagnosis not present

## 2017-09-15 DIAGNOSIS — E1169 Type 2 diabetes mellitus with other specified complication: Secondary | ICD-10-CM | POA: Diagnosis not present

## 2017-09-15 DIAGNOSIS — Z4781 Encounter for orthopedic aftercare following surgical amputation: Secondary | ICD-10-CM | POA: Diagnosis not present

## 2017-09-15 DIAGNOSIS — B9561 Methicillin susceptible Staphylococcus aureus infection as the cause of diseases classified elsewhere: Secondary | ICD-10-CM | POA: Diagnosis not present

## 2017-09-15 DIAGNOSIS — B9620 Unspecified Escherichia coli [E. coli] as the cause of diseases classified elsewhere: Secondary | ICD-10-CM | POA: Diagnosis not present

## 2017-09-15 DIAGNOSIS — B952 Enterococcus as the cause of diseases classified elsewhere: Secondary | ICD-10-CM | POA: Diagnosis not present

## 2017-09-15 DIAGNOSIS — M869 Osteomyelitis, unspecified: Secondary | ICD-10-CM | POA: Diagnosis not present

## 2017-09-20 DIAGNOSIS — I509 Heart failure, unspecified: Secondary | ICD-10-CM | POA: Diagnosis not present

## 2017-09-20 DIAGNOSIS — B9561 Methicillin susceptible Staphylococcus aureus infection as the cause of diseases classified elsewhere: Secondary | ICD-10-CM | POA: Diagnosis not present

## 2017-09-20 DIAGNOSIS — Z4781 Encounter for orthopedic aftercare following surgical amputation: Secondary | ICD-10-CM | POA: Diagnosis not present

## 2017-09-20 DIAGNOSIS — B9620 Unspecified Escherichia coli [E. coli] as the cause of diseases classified elsewhere: Secondary | ICD-10-CM | POA: Diagnosis not present

## 2017-09-20 DIAGNOSIS — M869 Osteomyelitis, unspecified: Secondary | ICD-10-CM | POA: Diagnosis not present

## 2017-09-20 DIAGNOSIS — E1169 Type 2 diabetes mellitus with other specified complication: Secondary | ICD-10-CM | POA: Diagnosis not present

## 2017-09-20 DIAGNOSIS — B952 Enterococcus as the cause of diseases classified elsewhere: Secondary | ICD-10-CM | POA: Diagnosis not present

## 2017-09-20 DIAGNOSIS — Z792 Long term (current) use of antibiotics: Secondary | ICD-10-CM | POA: Diagnosis not present

## 2017-09-22 DIAGNOSIS — Z4781 Encounter for orthopedic aftercare following surgical amputation: Secondary | ICD-10-CM | POA: Diagnosis not present

## 2017-09-22 DIAGNOSIS — B952 Enterococcus as the cause of diseases classified elsewhere: Secondary | ICD-10-CM | POA: Diagnosis not present

## 2017-09-22 DIAGNOSIS — B9620 Unspecified Escherichia coli [E. coli] as the cause of diseases classified elsewhere: Secondary | ICD-10-CM | POA: Diagnosis not present

## 2017-09-22 DIAGNOSIS — B9561 Methicillin susceptible Staphylococcus aureus infection as the cause of diseases classified elsewhere: Secondary | ICD-10-CM | POA: Diagnosis not present

## 2017-09-22 DIAGNOSIS — E1169 Type 2 diabetes mellitus with other specified complication: Secondary | ICD-10-CM | POA: Diagnosis not present

## 2017-09-22 DIAGNOSIS — M869 Osteomyelitis, unspecified: Secondary | ICD-10-CM | POA: Diagnosis not present

## 2017-09-23 DIAGNOSIS — B9561 Methicillin susceptible Staphylococcus aureus infection as the cause of diseases classified elsewhere: Secondary | ICD-10-CM | POA: Diagnosis not present

## 2017-09-23 DIAGNOSIS — B952 Enterococcus as the cause of diseases classified elsewhere: Secondary | ICD-10-CM | POA: Diagnosis not present

## 2017-09-23 DIAGNOSIS — E1169 Type 2 diabetes mellitus with other specified complication: Secondary | ICD-10-CM | POA: Diagnosis not present

## 2017-09-23 DIAGNOSIS — B9620 Unspecified Escherichia coli [E. coli] as the cause of diseases classified elsewhere: Secondary | ICD-10-CM | POA: Diagnosis not present

## 2017-09-23 DIAGNOSIS — M869 Osteomyelitis, unspecified: Secondary | ICD-10-CM | POA: Diagnosis not present

## 2017-09-23 DIAGNOSIS — Z4781 Encounter for orthopedic aftercare following surgical amputation: Secondary | ICD-10-CM | POA: Diagnosis not present

## 2017-09-24 DIAGNOSIS — Z452 Encounter for adjustment and management of vascular access device: Secondary | ICD-10-CM | POA: Diagnosis not present

## 2017-09-24 DIAGNOSIS — Z79899 Other long term (current) drug therapy: Secondary | ICD-10-CM | POA: Diagnosis not present

## 2017-10-01 DIAGNOSIS — I4892 Unspecified atrial flutter: Secondary | ICD-10-CM | POA: Diagnosis not present

## 2017-10-01 DIAGNOSIS — Z95810 Presence of automatic (implantable) cardiac defibrillator: Secondary | ICD-10-CM | POA: Diagnosis not present

## 2017-10-01 DIAGNOSIS — I5022 Chronic systolic (congestive) heart failure: Secondary | ICD-10-CM | POA: Diagnosis not present

## 2017-10-01 DIAGNOSIS — I2510 Atherosclerotic heart disease of native coronary artery without angina pectoris: Secondary | ICD-10-CM | POA: Diagnosis not present

## 2017-10-01 DIAGNOSIS — D649 Anemia, unspecified: Secondary | ICD-10-CM | POA: Diagnosis not present

## 2017-10-01 DIAGNOSIS — I130 Hypertensive heart and chronic kidney disease with heart failure and stage 1 through stage 4 chronic kidney disease, or unspecified chronic kidney disease: Secondary | ICD-10-CM | POA: Diagnosis not present

## 2017-10-01 DIAGNOSIS — N184 Chronic kidney disease, stage 4 (severe): Secondary | ICD-10-CM | POA: Diagnosis not present

## 2017-10-01 DIAGNOSIS — T50905S Adverse effect of unspecified drugs, medicaments and biological substances, sequela: Secondary | ICD-10-CM | POA: Diagnosis not present

## 2017-10-01 DIAGNOSIS — I509 Heart failure, unspecified: Secondary | ICD-10-CM | POA: Diagnosis not present

## 2017-10-04 DIAGNOSIS — I517 Cardiomegaly: Secondary | ICD-10-CM | POA: Diagnosis not present

## 2017-10-04 DIAGNOSIS — R9431 Abnormal electrocardiogram [ECG] [EKG]: Secondary | ICD-10-CM | POA: Diagnosis not present

## 2017-10-04 DIAGNOSIS — I454 Nonspecific intraventricular block: Secondary | ICD-10-CM | POA: Diagnosis not present

## 2017-10-06 DIAGNOSIS — T50905S Adverse effect of unspecified drugs, medicaments and biological substances, sequela: Secondary | ICD-10-CM | POA: Diagnosis not present

## 2017-10-06 DIAGNOSIS — D649 Anemia, unspecified: Secondary | ICD-10-CM | POA: Diagnosis not present

## 2017-10-06 DIAGNOSIS — I5022 Chronic systolic (congestive) heart failure: Secondary | ICD-10-CM | POA: Diagnosis not present

## 2017-10-12 DIAGNOSIS — N184 Chronic kidney disease, stage 4 (severe): Secondary | ICD-10-CM | POA: Diagnosis not present

## 2017-10-12 DIAGNOSIS — I2510 Atherosclerotic heart disease of native coronary artery without angina pectoris: Secondary | ICD-10-CM | POA: Diagnosis not present

## 2017-10-12 DIAGNOSIS — I5022 Chronic systolic (congestive) heart failure: Secondary | ICD-10-CM | POA: Diagnosis not present

## 2017-10-12 DIAGNOSIS — Z95810 Presence of automatic (implantable) cardiac defibrillator: Secondary | ICD-10-CM | POA: Diagnosis not present

## 2017-10-12 DIAGNOSIS — I130 Hypertensive heart and chronic kidney disease with heart failure and stage 1 through stage 4 chronic kidney disease, or unspecified chronic kidney disease: Secondary | ICD-10-CM | POA: Diagnosis not present

## 2017-10-12 DIAGNOSIS — D509 Iron deficiency anemia, unspecified: Secondary | ICD-10-CM | POA: Diagnosis not present

## 2017-10-19 DIAGNOSIS — E118 Type 2 diabetes mellitus with unspecified complications: Secondary | ICD-10-CM | POA: Diagnosis not present

## 2017-10-19 DIAGNOSIS — Z89429 Acquired absence of other toe(s), unspecified side: Secondary | ICD-10-CM | POA: Diagnosis not present

## 2017-10-19 DIAGNOSIS — N184 Chronic kidney disease, stage 4 (severe): Secondary | ICD-10-CM | POA: Diagnosis not present

## 2017-10-19 DIAGNOSIS — L97512 Non-pressure chronic ulcer of other part of right foot with fat layer exposed: Secondary | ICD-10-CM | POA: Diagnosis not present

## 2017-10-19 DIAGNOSIS — L089 Local infection of the skin and subcutaneous tissue, unspecified: Secondary | ICD-10-CM | POA: Diagnosis not present

## 2017-10-19 DIAGNOSIS — E11621 Type 2 diabetes mellitus with foot ulcer: Secondary | ICD-10-CM | POA: Diagnosis not present

## 2017-10-19 DIAGNOSIS — Z794 Long term (current) use of insulin: Secondary | ICD-10-CM | POA: Diagnosis not present

## 2017-10-20 DIAGNOSIS — E1142 Type 2 diabetes mellitus with diabetic polyneuropathy: Secondary | ICD-10-CM | POA: Diagnosis not present

## 2017-10-20 DIAGNOSIS — E11621 Type 2 diabetes mellitus with foot ulcer: Secondary | ICD-10-CM | POA: Diagnosis not present

## 2017-10-20 DIAGNOSIS — L97512 Non-pressure chronic ulcer of other part of right foot with fat layer exposed: Secondary | ICD-10-CM | POA: Diagnosis not present

## 2017-10-20 DIAGNOSIS — Z89421 Acquired absence of other right toe(s): Secondary | ICD-10-CM | POA: Diagnosis not present

## 2017-10-25 DIAGNOSIS — E1122 Type 2 diabetes mellitus with diabetic chronic kidney disease: Secondary | ICD-10-CM | POA: Diagnosis not present

## 2017-10-25 DIAGNOSIS — E11621 Type 2 diabetes mellitus with foot ulcer: Secondary | ICD-10-CM | POA: Diagnosis not present

## 2017-10-25 DIAGNOSIS — L97512 Non-pressure chronic ulcer of other part of right foot with fat layer exposed: Secondary | ICD-10-CM | POA: Diagnosis not present

## 2017-10-25 DIAGNOSIS — N183 Chronic kidney disease, stage 3 (moderate): Secondary | ICD-10-CM | POA: Diagnosis not present

## 2017-10-25 DIAGNOSIS — I129 Hypertensive chronic kidney disease with stage 1 through stage 4 chronic kidney disease, or unspecified chronic kidney disease: Secondary | ICD-10-CM | POA: Diagnosis not present

## 2017-10-25 DIAGNOSIS — Z794 Long term (current) use of insulin: Secondary | ICD-10-CM | POA: Diagnosis not present

## 2017-10-25 DIAGNOSIS — E1142 Type 2 diabetes mellitus with diabetic polyneuropathy: Secondary | ICD-10-CM | POA: Diagnosis not present

## 2017-10-25 DIAGNOSIS — N179 Acute kidney failure, unspecified: Secondary | ICD-10-CM | POA: Diagnosis not present

## 2017-10-25 DIAGNOSIS — I5042 Chronic combined systolic (congestive) and diastolic (congestive) heart failure: Secondary | ICD-10-CM | POA: Diagnosis not present

## 2017-10-25 DIAGNOSIS — E669 Obesity, unspecified: Secondary | ICD-10-CM | POA: Diagnosis not present

## 2017-10-25 DIAGNOSIS — Z89421 Acquired absence of other right toe(s): Secondary | ICD-10-CM | POA: Diagnosis not present

## 2017-10-25 DIAGNOSIS — D631 Anemia in chronic kidney disease: Secondary | ICD-10-CM | POA: Diagnosis not present

## 2017-10-27 DIAGNOSIS — G5603 Carpal tunnel syndrome, bilateral upper limbs: Secondary | ICD-10-CM | POA: Diagnosis not present

## 2017-10-27 DIAGNOSIS — G5620 Lesion of ulnar nerve, unspecified upper limb: Secondary | ICD-10-CM | POA: Diagnosis not present

## 2017-10-27 DIAGNOSIS — G5623 Lesion of ulnar nerve, bilateral upper limbs: Secondary | ICD-10-CM | POA: Diagnosis not present

## 2017-11-03 DIAGNOSIS — L97512 Non-pressure chronic ulcer of other part of right foot with fat layer exposed: Secondary | ICD-10-CM | POA: Diagnosis not present

## 2017-11-03 DIAGNOSIS — Z89429 Acquired absence of other toe(s), unspecified side: Secondary | ICD-10-CM | POA: Diagnosis not present

## 2017-11-03 DIAGNOSIS — E11621 Type 2 diabetes mellitus with foot ulcer: Secondary | ICD-10-CM | POA: Diagnosis not present

## 2017-11-05 DIAGNOSIS — I5020 Unspecified systolic (congestive) heart failure: Secondary | ICD-10-CM | POA: Diagnosis not present

## 2017-11-05 DIAGNOSIS — E11621 Type 2 diabetes mellitus with foot ulcer: Secondary | ICD-10-CM | POA: Diagnosis not present

## 2017-11-05 DIAGNOSIS — L97512 Non-pressure chronic ulcer of other part of right foot with fat layer exposed: Secondary | ICD-10-CM | POA: Diagnosis not present

## 2017-11-05 DIAGNOSIS — R0989 Other specified symptoms and signs involving the circulatory and respiratory systems: Secondary | ICD-10-CM | POA: Diagnosis not present

## 2017-11-05 DIAGNOSIS — T8131XA Disruption of external operation (surgical) wound, not elsewhere classified, initial encounter: Secondary | ICD-10-CM | POA: Diagnosis not present

## 2017-11-05 DIAGNOSIS — R509 Fever, unspecified: Secondary | ICD-10-CM | POA: Diagnosis not present

## 2017-11-05 DIAGNOSIS — N289 Disorder of kidney and ureter, unspecified: Secondary | ICD-10-CM | POA: Diagnosis not present

## 2017-11-05 DIAGNOSIS — I130 Hypertensive heart and chronic kidney disease with heart failure and stage 1 through stage 4 chronic kidney disease, or unspecified chronic kidney disease: Secondary | ICD-10-CM | POA: Diagnosis not present

## 2017-11-05 DIAGNOSIS — M79671 Pain in right foot: Secondary | ICD-10-CM | POA: Diagnosis not present

## 2017-11-05 DIAGNOSIS — M86171 Other acute osteomyelitis, right ankle and foot: Secondary | ICD-10-CM | POA: Diagnosis not present

## 2017-11-05 DIAGNOSIS — E1169 Type 2 diabetes mellitus with other specified complication: Secondary | ICD-10-CM | POA: Diagnosis not present

## 2017-11-05 DIAGNOSIS — R2689 Other abnormalities of gait and mobility: Secondary | ICD-10-CM | POA: Diagnosis not present

## 2017-11-05 DIAGNOSIS — M545 Low back pain: Secondary | ICD-10-CM | POA: Diagnosis not present

## 2017-11-05 DIAGNOSIS — E1140 Type 2 diabetes mellitus with diabetic neuropathy, unspecified: Secondary | ICD-10-CM | POA: Diagnosis not present

## 2017-11-06 DIAGNOSIS — L97515 Non-pressure chronic ulcer of other part of right foot with muscle involvement without evidence of necrosis: Secondary | ICD-10-CM | POA: Diagnosis not present

## 2017-11-06 DIAGNOSIS — G5623 Lesion of ulnar nerve, bilateral upper limbs: Secondary | ICD-10-CM | POA: Diagnosis not present

## 2017-11-06 DIAGNOSIS — I2510 Atherosclerotic heart disease of native coronary artery without angina pectoris: Secondary | ICD-10-CM | POA: Diagnosis not present

## 2017-11-06 DIAGNOSIS — E1165 Type 2 diabetes mellitus with hyperglycemia: Secondary | ICD-10-CM | POA: Diagnosis not present

## 2017-11-06 DIAGNOSIS — D509 Iron deficiency anemia, unspecified: Secondary | ICD-10-CM | POA: Diagnosis not present

## 2017-11-06 DIAGNOSIS — E1169 Type 2 diabetes mellitus with other specified complication: Secondary | ICD-10-CM | POA: Diagnosis not present

## 2017-11-06 DIAGNOSIS — R9389 Abnormal findings on diagnostic imaging of other specified body structures: Secondary | ICD-10-CM | POA: Diagnosis not present

## 2017-11-06 DIAGNOSIS — I5020 Unspecified systolic (congestive) heart failure: Secondary | ICD-10-CM | POA: Diagnosis present

## 2017-11-06 DIAGNOSIS — N183 Chronic kidney disease, stage 3 (moderate): Secondary | ICD-10-CM | POA: Diagnosis not present

## 2017-11-06 DIAGNOSIS — Z89421 Acquired absence of other right toe(s): Secondary | ICD-10-CM | POA: Diagnosis not present

## 2017-11-06 DIAGNOSIS — I5022 Chronic systolic (congestive) heart failure: Secondary | ICD-10-CM | POA: Diagnosis not present

## 2017-11-06 DIAGNOSIS — I6529 Occlusion and stenosis of unspecified carotid artery: Secondary | ICD-10-CM | POA: Diagnosis not present

## 2017-11-06 DIAGNOSIS — G5620 Lesion of ulnar nerve, unspecified upper limb: Secondary | ICD-10-CM | POA: Diagnosis not present

## 2017-11-06 DIAGNOSIS — I517 Cardiomegaly: Secondary | ICD-10-CM | POA: Diagnosis not present

## 2017-11-06 DIAGNOSIS — Z89411 Acquired absence of right great toe: Secondary | ICD-10-CM | POA: Diagnosis not present

## 2017-11-06 DIAGNOSIS — I4892 Unspecified atrial flutter: Secondary | ICD-10-CM | POA: Diagnosis not present

## 2017-11-06 DIAGNOSIS — E1122 Type 2 diabetes mellitus with diabetic chronic kidney disease: Secondary | ICD-10-CM | POA: Diagnosis not present

## 2017-11-06 DIAGNOSIS — I130 Hypertensive heart and chronic kidney disease with heart failure and stage 1 through stage 4 chronic kidney disease, or unspecified chronic kidney disease: Secondary | ICD-10-CM | POA: Diagnosis not present

## 2017-11-06 DIAGNOSIS — Z951 Presence of aortocoronary bypass graft: Secondary | ICD-10-CM | POA: Diagnosis not present

## 2017-11-06 DIAGNOSIS — M869 Osteomyelitis, unspecified: Secondary | ICD-10-CM | POA: Diagnosis not present

## 2017-11-06 DIAGNOSIS — L97512 Non-pressure chronic ulcer of other part of right foot with fat layer exposed: Secondary | ICD-10-CM | POA: Diagnosis not present

## 2017-11-06 DIAGNOSIS — R509 Fever, unspecified: Secondary | ICD-10-CM | POA: Diagnosis not present

## 2017-11-06 DIAGNOSIS — L989 Disorder of the skin and subcutaneous tissue, unspecified: Secondary | ICD-10-CM | POA: Diagnosis not present

## 2017-11-06 DIAGNOSIS — Z95820 Peripheral vascular angioplasty status with implants and grafts: Secondary | ICD-10-CM | POA: Diagnosis not present

## 2017-11-06 DIAGNOSIS — E875 Hyperkalemia: Secondary | ICD-10-CM | POA: Diagnosis not present

## 2017-11-06 DIAGNOSIS — M868X7 Other osteomyelitis, ankle and foot: Secondary | ICD-10-CM | POA: Diagnosis not present

## 2017-11-06 DIAGNOSIS — R079 Chest pain, unspecified: Secondary | ICD-10-CM | POA: Diagnosis not present

## 2017-11-06 DIAGNOSIS — R918 Other nonspecific abnormal finding of lung field: Secondary | ICD-10-CM | POA: Diagnosis not present

## 2017-11-06 DIAGNOSIS — M79671 Pain in right foot: Secondary | ICD-10-CM | POA: Diagnosis not present

## 2017-11-06 DIAGNOSIS — E11621 Type 2 diabetes mellitus with foot ulcer: Secondary | ICD-10-CM | POA: Diagnosis not present

## 2017-11-06 DIAGNOSIS — D72829 Elevated white blood cell count, unspecified: Secondary | ICD-10-CM | POA: Diagnosis not present

## 2017-11-06 DIAGNOSIS — M545 Low back pain: Secondary | ICD-10-CM | POA: Diagnosis not present

## 2017-11-06 DIAGNOSIS — E785 Hyperlipidemia, unspecified: Secondary | ICD-10-CM | POA: Diagnosis not present

## 2017-11-06 DIAGNOSIS — I493 Ventricular premature depolarization: Secondary | ICD-10-CM | POA: Diagnosis not present

## 2017-11-06 DIAGNOSIS — T8131XA Disruption of external operation (surgical) wound, not elsewhere classified, initial encounter: Secondary | ICD-10-CM | POA: Diagnosis present

## 2017-11-06 DIAGNOSIS — E1142 Type 2 diabetes mellitus with diabetic polyneuropathy: Secondary | ICD-10-CM | POA: Diagnosis not present

## 2017-11-06 DIAGNOSIS — G4733 Obstructive sleep apnea (adult) (pediatric): Secondary | ICD-10-CM | POA: Diagnosis present

## 2017-11-06 DIAGNOSIS — G5603 Carpal tunnel syndrome, bilateral upper limbs: Secondary | ICD-10-CM | POA: Diagnosis not present

## 2017-11-06 DIAGNOSIS — M86171 Other acute osteomyelitis, right ankle and foot: Secondary | ICD-10-CM | POA: Diagnosis not present

## 2017-11-06 DIAGNOSIS — Z95810 Presence of automatic (implantable) cardiac defibrillator: Secondary | ICD-10-CM | POA: Diagnosis not present

## 2017-11-06 DIAGNOSIS — N184 Chronic kidney disease, stage 4 (severe): Secondary | ICD-10-CM | POA: Diagnosis not present

## 2017-11-06 DIAGNOSIS — R937 Abnormal findings on diagnostic imaging of other parts of musculoskeletal system: Secondary | ICD-10-CM | POA: Diagnosis not present

## 2017-11-06 DIAGNOSIS — E1121 Type 2 diabetes mellitus with diabetic nephropathy: Secondary | ICD-10-CM | POA: Diagnosis not present

## 2017-11-06 DIAGNOSIS — L97514 Non-pressure chronic ulcer of other part of right foot with necrosis of bone: Secondary | ICD-10-CM | POA: Diagnosis not present

## 2017-11-06 DIAGNOSIS — M109 Gout, unspecified: Secondary | ICD-10-CM | POA: Diagnosis present

## 2017-11-06 DIAGNOSIS — I129 Hypertensive chronic kidney disease with stage 1 through stage 4 chronic kidney disease, or unspecified chronic kidney disease: Secondary | ICD-10-CM | POA: Diagnosis not present

## 2017-11-06 DIAGNOSIS — Z794 Long term (current) use of insulin: Secondary | ICD-10-CM | POA: Diagnosis not present

## 2017-11-06 DIAGNOSIS — Z955 Presence of coronary angioplasty implant and graft: Secondary | ICD-10-CM | POA: Diagnosis not present

## 2017-11-06 DIAGNOSIS — E1151 Type 2 diabetes mellitus with diabetic peripheral angiopathy without gangrene: Secondary | ICD-10-CM | POA: Diagnosis not present

## 2017-11-06 DIAGNOSIS — L97519 Non-pressure chronic ulcer of other part of right foot with unspecified severity: Secondary | ICD-10-CM | POA: Diagnosis not present

## 2017-11-06 DIAGNOSIS — I255 Ischemic cardiomyopathy: Secondary | ICD-10-CM | POA: Diagnosis not present

## 2017-11-17 DIAGNOSIS — N183 Chronic kidney disease, stage 3 (moderate): Secondary | ICD-10-CM | POA: Diagnosis not present

## 2017-11-17 DIAGNOSIS — M1030 Gout due to renal impairment, unspecified site: Secondary | ICD-10-CM | POA: Diagnosis not present

## 2017-11-17 DIAGNOSIS — M10342 Gout due to renal impairment, left hand: Secondary | ICD-10-CM | POA: Diagnosis not present

## 2017-11-17 DIAGNOSIS — I129 Hypertensive chronic kidney disease with stage 1 through stage 4 chronic kidney disease, or unspecified chronic kidney disease: Secondary | ICD-10-CM | POA: Diagnosis not present

## 2017-11-17 DIAGNOSIS — Z794 Long term (current) use of insulin: Secondary | ICD-10-CM | POA: Diagnosis not present

## 2017-11-17 DIAGNOSIS — E1122 Type 2 diabetes mellitus with diabetic chronic kidney disease: Secondary | ICD-10-CM | POA: Diagnosis not present

## 2017-11-17 DIAGNOSIS — M10341 Gout due to renal impairment, right hand: Secondary | ICD-10-CM | POA: Diagnosis not present

## 2017-11-26 DIAGNOSIS — Z89431 Acquired absence of right foot: Secondary | ICD-10-CM | POA: Diagnosis not present

## 2017-11-26 DIAGNOSIS — I130 Hypertensive heart and chronic kidney disease with heart failure and stage 1 through stage 4 chronic kidney disease, or unspecified chronic kidney disease: Secondary | ICD-10-CM | POA: Diagnosis present

## 2017-11-26 DIAGNOSIS — E1122 Type 2 diabetes mellitus with diabetic chronic kidney disease: Secondary | ICD-10-CM | POA: Diagnosis present

## 2017-11-26 DIAGNOSIS — I951 Orthostatic hypotension: Secondary | ICD-10-CM | POA: Diagnosis present

## 2017-11-26 DIAGNOSIS — S3993XA Unspecified injury of pelvis, initial encounter: Secondary | ICD-10-CM | POA: Diagnosis not present

## 2017-11-26 DIAGNOSIS — E1169 Type 2 diabetes mellitus with other specified complication: Secondary | ICD-10-CM | POA: Diagnosis not present

## 2017-11-26 DIAGNOSIS — R079 Chest pain, unspecified: Secondary | ICD-10-CM | POA: Diagnosis not present

## 2017-11-26 DIAGNOSIS — Y999 Unspecified external cause status: Secondary | ICD-10-CM | POA: Diagnosis not present

## 2017-11-26 DIAGNOSIS — Z794 Long term (current) use of insulin: Secondary | ICD-10-CM | POA: Diagnosis not present

## 2017-11-26 DIAGNOSIS — I2510 Atherosclerotic heart disease of native coronary artery without angina pectoris: Secondary | ICD-10-CM | POA: Diagnosis present

## 2017-11-26 DIAGNOSIS — W1839XA Other fall on same level, initial encounter: Secondary | ICD-10-CM | POA: Diagnosis not present

## 2017-11-26 DIAGNOSIS — Z8249 Family history of ischemic heart disease and other diseases of the circulatory system: Secondary | ICD-10-CM | POA: Diagnosis not present

## 2017-11-26 DIAGNOSIS — S3992XA Unspecified injury of lower back, initial encounter: Secondary | ICD-10-CM | POA: Diagnosis not present

## 2017-11-26 DIAGNOSIS — F458 Other somatoform disorders: Secondary | ICD-10-CM | POA: Diagnosis not present

## 2017-11-26 DIAGNOSIS — N5201 Erectile dysfunction due to arterial insufficiency: Secondary | ICD-10-CM | POA: Diagnosis present

## 2017-11-26 DIAGNOSIS — R296 Repeated falls: Secondary | ICD-10-CM | POA: Diagnosis not present

## 2017-11-26 DIAGNOSIS — N184 Chronic kidney disease, stage 4 (severe): Secondary | ICD-10-CM | POA: Diagnosis present

## 2017-11-26 DIAGNOSIS — E1165 Type 2 diabetes mellitus with hyperglycemia: Secondary | ICD-10-CM | POA: Diagnosis present

## 2017-11-26 DIAGNOSIS — I429 Cardiomyopathy, unspecified: Secondary | ICD-10-CM | POA: Diagnosis present

## 2017-11-26 DIAGNOSIS — Z951 Presence of aortocoronary bypass graft: Secondary | ICD-10-CM | POA: Diagnosis not present

## 2017-11-26 DIAGNOSIS — M545 Low back pain: Secondary | ICD-10-CM | POA: Diagnosis not present

## 2017-11-26 DIAGNOSIS — Z7982 Long term (current) use of aspirin: Secondary | ICD-10-CM | POA: Diagnosis not present

## 2017-11-26 DIAGNOSIS — E1143 Type 2 diabetes mellitus with diabetic autonomic (poly)neuropathy: Secondary | ICD-10-CM | POA: Diagnosis not present

## 2017-11-26 DIAGNOSIS — M549 Dorsalgia, unspecified: Secondary | ICD-10-CM | POA: Diagnosis not present

## 2017-11-26 DIAGNOSIS — Z79899 Other long term (current) drug therapy: Secondary | ICD-10-CM | POA: Diagnosis not present

## 2017-11-26 DIAGNOSIS — M62541 Muscle wasting and atrophy, not elsewhere classified, right hand: Secondary | ICD-10-CM | POA: Diagnosis present

## 2017-11-26 DIAGNOSIS — S299XXA Unspecified injury of thorax, initial encounter: Secondary | ICD-10-CM | POA: Diagnosis not present

## 2017-11-26 DIAGNOSIS — G5603 Carpal tunnel syndrome, bilateral upper limbs: Secondary | ICD-10-CM | POA: Diagnosis present

## 2017-11-26 DIAGNOSIS — Z95810 Presence of automatic (implantable) cardiac defibrillator: Secondary | ICD-10-CM | POA: Diagnosis not present

## 2017-11-26 DIAGNOSIS — E559 Vitamin D deficiency, unspecified: Secondary | ICD-10-CM | POA: Diagnosis present

## 2017-11-26 DIAGNOSIS — Y92002 Bathroom of unspecified non-institutional (private) residence single-family (private) house as the place of occurrence of the external cause: Secondary | ICD-10-CM | POA: Diagnosis not present

## 2017-11-26 DIAGNOSIS — Z950 Presence of cardiac pacemaker: Secondary | ICD-10-CM | POA: Diagnosis not present

## 2017-11-26 DIAGNOSIS — I5022 Chronic systolic (congestive) heart failure: Secondary | ICD-10-CM | POA: Diagnosis not present

## 2017-11-26 DIAGNOSIS — Z833 Family history of diabetes mellitus: Secondary | ICD-10-CM | POA: Diagnosis not present

## 2017-11-26 DIAGNOSIS — G5623 Lesion of ulnar nerve, bilateral upper limbs: Secondary | ICD-10-CM | POA: Diagnosis not present

## 2017-11-26 DIAGNOSIS — R55 Syncope and collapse: Secondary | ICD-10-CM | POA: Diagnosis not present

## 2017-11-26 DIAGNOSIS — I5042 Chronic combined systolic (congestive) and diastolic (congestive) heart failure: Secondary | ICD-10-CM | POA: Diagnosis present

## 2017-11-26 DIAGNOSIS — N179 Acute kidney failure, unspecified: Secondary | ICD-10-CM | POA: Diagnosis present

## 2017-12-07 ENCOUNTER — Telehealth: Payer: Self-pay | Admitting: Cardiovascular Disease

## 2017-12-07 NOTE — Telephone Encounter (Signed)
Attempted to contact pt to schedule carotid u/s. Unable to leave message

## 2017-12-14 DIAGNOSIS — M7989 Other specified soft tissue disorders: Secondary | ICD-10-CM | POA: Diagnosis not present

## 2017-12-14 DIAGNOSIS — M109 Gout, unspecified: Secondary | ICD-10-CM | POA: Diagnosis not present

## 2017-12-14 NOTE — Telephone Encounter (Signed)
Attempted to contact pt to schedule No VM

## 2017-12-21 ENCOUNTER — Encounter: Payer: Self-pay | Admitting: Internal Medicine

## 2017-12-21 DIAGNOSIS — L97519 Non-pressure chronic ulcer of other part of right foot with unspecified severity: Secondary | ICD-10-CM | POA: Diagnosis not present

## 2017-12-21 DIAGNOSIS — E1142 Type 2 diabetes mellitus with diabetic polyneuropathy: Secondary | ICD-10-CM | POA: Diagnosis not present

## 2017-12-21 DIAGNOSIS — E11621 Type 2 diabetes mellitus with foot ulcer: Secondary | ICD-10-CM | POA: Diagnosis not present

## 2017-12-21 DIAGNOSIS — Z89421 Acquired absence of other right toe(s): Secondary | ICD-10-CM | POA: Diagnosis not present

## 2017-12-21 NOTE — Telephone Encounter (Signed)
Attempted to call patient/number is no longer working. Sent letter

## 2017-12-23 DIAGNOSIS — E1143 Type 2 diabetes mellitus with diabetic autonomic (poly)neuropathy: Secondary | ICD-10-CM | POA: Diagnosis not present

## 2017-12-23 DIAGNOSIS — E790 Hyperuricemia without signs of inflammatory arthritis and tophaceous disease: Secondary | ICD-10-CM | POA: Diagnosis not present

## 2017-12-23 DIAGNOSIS — I10 Essential (primary) hypertension: Secondary | ICD-10-CM | POA: Diagnosis not present

## 2017-12-25 ENCOUNTER — Emergency Department (HOSPITAL_COMMUNITY)
Admission: EM | Admit: 2017-12-25 | Discharge: 2017-12-25 | Disposition: A | Discharge status: Home / Self Care | Payer: Medicare Other | Attending: Emergency Medicine | Admitting: Emergency Medicine

## 2017-12-25 ENCOUNTER — Encounter (HOSPITAL_COMMUNITY): Payer: Self-pay | Admitting: Emergency Medicine

## 2017-12-25 DIAGNOSIS — I130 Hypertensive heart and chronic kidney disease with heart failure and stage 1 through stage 4 chronic kidney disease, or unspecified chronic kidney disease: Secondary | ICD-10-CM | POA: Insufficient documentation

## 2017-12-25 DIAGNOSIS — E119 Type 2 diabetes mellitus without complications: Secondary | ICD-10-CM | POA: Insufficient documentation

## 2017-12-25 DIAGNOSIS — M79602 Pain in left arm: Secondary | ICD-10-CM | POA: Insufficient documentation

## 2017-12-25 DIAGNOSIS — Z7982 Long term (current) use of aspirin: Secondary | ICD-10-CM | POA: Diagnosis not present

## 2017-12-25 DIAGNOSIS — Z794 Long term (current) use of insulin: Secondary | ICD-10-CM | POA: Insufficient documentation

## 2017-12-25 DIAGNOSIS — N183 Chronic kidney disease, stage 3 (moderate): Secondary | ICD-10-CM | POA: Insufficient documentation

## 2017-12-25 DIAGNOSIS — Z79899 Other long term (current) drug therapy: Secondary | ICD-10-CM | POA: Insufficient documentation

## 2017-12-25 DIAGNOSIS — I5022 Chronic systolic (congestive) heart failure: Secondary | ICD-10-CM | POA: Diagnosis not present

## 2017-12-25 DIAGNOSIS — I2510 Atherosclerotic heart disease of native coronary artery without angina pectoris: Secondary | ICD-10-CM | POA: Insufficient documentation

## 2017-12-25 MED ORDER — PREDNISONE 20 MG PO TABS: 60.0000 mg | ORAL_TABLET | Freq: Every day | ORAL | 0 refills | Status: AC

## 2017-12-25 NOTE — Discharge Instructions (Addendum)
Please follow up with PCP for reevaluation of your symptoms.I have prescribed steroids for your pain, please take as directed.Continue to monitor your glucose while taking steroids. If your symptoms worsen or you experience worsening in symptoms please return to the ED.

## 2017-12-25 NOTE — ED Triage Notes (Signed)
Pt reports hx of carpal tunnel, cubital tunnel and gout in his left arm, states pain has been on and off, was treated with allopurinol but states that he found out the allopurinol could be causing gout flares since he is not on colchicine with it. States left hand and elbow started hurting Thursday.

## 2017-12-25 NOTE — ED Provider Notes (Signed)
[facility] EMERGENCY DEPARTMENT Provider Note   CSN: [id number] Arrival date & time: 12/25/17  9390     History   Chief Complaint Chief Complaint  Patient presents with  . Arm Pain    HPI Paul [name]. is a 52 y.o. male.  52 y.o male with an extensive PMH of DM,Gout, HTN. CAD presents to the ED with a chief complaint of left hand and elbow pain x 3 days. Patient states he has carpal tunnel and cubital tunnel syndrome which he is scheduled for surgery in October, 2019. However, he states he states he takes allopurinol along with colchicine but was told he needed to stop colchicine and now has a flare up.He describes the pain as tingling and numbness radiating from his left hand to his left elbow. Patient denies any fever, weakness, chest pain or shortness of breath.      Past Medical History:  Diagnosis Date  . Anemia   . Cardiac defibrillator in place    a. Biotronik LUmax 540 DRT, (ser # [id number]).  . Carotid arterial disease ([place])    a. s/p prior LICA stenting;  b. 0/7622 Carotid U/S: 40-59% bilat ICA stenosis. Patent LICA stent.  . CKD (chronic kidney disease), stage III ([place])   . Coronary artery disease    a. 2010 s/p CABG x 3.  . DDD (degenerative disc disease), lumbosacral    L5-S1  . Diabetes ([place])    Lantus at bedtime  . Gangrene of toe of right foot ([place])   . Gout of left hand 10/06/2016  . HFrEF (heart failure with reduced ejection fraction) ([place])    a. 07/2016 Echo: EF 25-30%, diff HK, mild MR, mildly dil LA, mod reduced RV fxn, PASP 92mmHg.  Marland Kitchen Hypertension    takes Coreg daily  . Ischemic cardiomyopathy    a. 07/2016 Echo: EF 25-30%, diff HK.  . Myocardial infarction ([place]) 2009  . Peripheral vascular disease ([place])   . Sleep apnea    sleep study yr ago-unable to afford cpap  . Stroke [facility]) 09   no weakness    Patient Active Problem List   Diagnosis Date Noted  . CKD (chronic kidney disease), stage III ([place]) 12/15/2016  .  Ulcerated, foot ([place]) 12/15/2016  . Gangrene of foot ([place]) 08/21/2016  . HTN (hypertension) 08/21/2016  . Diabetes ([place]) 08/21/2016  . CAD (coronary artery disease) 08/21/2016  . Chronic systolic CHF (congestive heart failure) ([place]) 08/21/2016  . Diabetic foot infection ([place]) 08/21/2016  . Heterotopic ossification of bone 07/12/2014    Past Surgical History:  Procedure Laterality Date  . AMPUTATION TOE Right 08/22/2016   Procedure: AMPUTATION TOE;  Surgeon: Samara Deist, DPM;  Location: [facility] ORS;  Service: Podiatry;  Laterality: Right;  . AMPUTATION TOE Right 10/09/2016   Procedure: AMPUTATION TOE-RIGHT 2ND MPJ;  Surgeon: Samara Deist, DPM;  Location: [facility] ORS;  Service: Podiatry;  Laterality: Right;  . APLIGRAFT PLACEMENT Right 10/09/2016   Procedure: APLIGRAFT PLACEMENT;  Surgeon: Samara Deist, DPM;  Location: [facility] ORS;  Service: Podiatry;  Laterality: Right;  . CARDIAC DEFIBRILLATOR PLACEMENT  2011  . CAROTID ENDARTERECTOMY Left   . CHOLECYSTECTOMY  2010  . CORONARY ARTERY BYPASS GRAFT  2010   CABG x 3   . GRAFT APPLICATION Right 6/33/3545   Procedure: FULL THICKNESS SKIN GRAFT-RIGHT FOOT;  Surgeon: Algernon Huxley, MD;  Location: [facility] ORS;  Service: Vascular;  Laterality: Right;  . HIP SURGERY Right 1994  . INCISION AND  DRAINAGE OF WOUND Right 08/22/2016   Procedure: IRRIGATION AND DEBRIDEMENT WOUND and wound vac placement;  Surgeon: Samara Deist, DPM;  Location: [facility] ORS;  Service: Podiatry;  Laterality: Right;  . LOWER EXTREMITY ANGIOGRAPHY Right 08/24/2016   Procedure: Lower Extremity Angiography;  Surgeon: Algernon Huxley, MD;  Location: [facility];  Service: Cardiovascular;  Laterality: Right;  . MASS EXCISION Right 07/18/2014   Procedure: EXCISION HETEROTOPIC BONE RIGHT HIP;  Surgeon: Frederik Pear, MD;  Location: [facility];  Service: Orthopedics;  Laterality: Right;  . Open Heart Surgery  2010   x 3  . WOUND DEBRIDEMENT Right 10/09/2016   Procedure: DEBRIDEMENT WOUND;  Surgeon:  Samara Deist, DPM;  Location: [facility] ORS;  Service: Podiatry;  Laterality: Right;        Home Medications    Prior to Admission medications   Medication Sig Start Date End Date Taking? Authorizing Provider  aspirin EC 81 MG tablet Take 1 tablet (81 mg total) by mouth daily. 10/21/16   Theora Gianotti, NP  carvedilol (COREG) 12.5 MG tablet Take 12.5 mg by mouth daily.     [provider]  colchicine 0.6 MG tablet Take 1 tablet (0.6 mg total) by mouth daily as needed. Patient taking differently: Take 0.6 mg by mouth daily as needed (for gout flares).  08/27/16   Gladstone Lighter, MD  indomethacin (INDOCIN) 50 MG capsule  01/12/17   [provider]  insulin glargine (LANTUS) 100 UNIT/ML injection Inject 24 Units into the skin every morning.     [provider]  oxyCODONE-acetaminophen (ROXICET) 5-325 MG tablet Take 1 tablet by mouth every 6 (six) hours as needed. 01/06/17   Loney Hering, MD  predniSONE (DELTASONE) 20 MG tablet Take 3 tablets (60 mg total) by mouth daily for 6 days. Take 6 tablet on day 1 Take 5 tablet on day 2 Take 4 tablets on day 3 Take 3 tablets on day 4 Take 2 tablets on day 5 Tale 1 tablet on day 6 12/25/17 12/31/17  Janeece Fitting, PA-C  spironolactone (ALDACTONE) 25 MG tablet Take 25 mg by mouth daily.    [provider]  torsemide (DEMADEX) 20 MG tablet Take 1 tablet (20 mg total) by mouth daily. 10/21/16   Theora Gianotti, NP    Family History Family History  Problem Relation Age of Onset  . Hypertension Other   . Diabetes Other   . Diabetes Father   . Hyperlipidemia Father   . Hypertension Father   . Hyperlipidemia Sister   . Hypertension Sister   . Diabetes Sister   . Diabetes Brother   . Hypertension Brother   . Hyperlipidemia Brother     Social History Social History   Tobacco Use  . Smoking status: Never Smoker  . Smokeless tobacco: Never Used  Substance Use Topics  . Alcohol use: No    . Drug use: No     Allergies   Ibuprofen   Review of Systems Review of Systems  Constitutional: Negative for chills and fever.  Respiratory: Negative for shortness of breath.   Cardiovascular: Negative for chest pain.  Gastrointestinal: Negative for abdominal pain.  Musculoskeletal: Positive for arthralgias and joint swelling. Negative for myalgias and neck pain.  All other systems reviewed and are negative.    Physical Exam Updated Vital Signs BP 125/81   Pulse 75   Temp 97.6 F (36.4 C) (Oral)   Resp 12   SpO2 100%   Physical Exam  Constitutional: He is oriented to person, place, and time. He appears well-developed and well-nourished.  HENT:  Head: Normocephalic and atraumatic.  Eyes: Pupils are equal, round, and reactive to light.  Neck: Normal range of motion. Neck supple.  Cardiovascular: Normal heart sounds.  Pulmonary/Chest: Effort normal and breath sounds normal. He has no wheezes.  Abdominal: Soft. Bowel sounds are normal. There is no tenderness.  Musculoskeletal: He exhibits tenderness. He exhibits no deformity.       Left elbow: He exhibits swelling. He exhibits no effusion, no deformity and no laceration. Tenderness found. Medial epicondyle tenderness noted.       Left wrist: He exhibits tenderness. He exhibits no swelling, no effusion, no crepitus, no deformity and no laceration.  Patient has Full ROM of left elbow, and hand. Strength is symmetric and BL.  No erythema, edema on elbow joint or wrist joint.   Neurological: He is alert and oriented to person, place, and time.  Skin: Skin is warm and dry.  Nursing note and vitals reviewed.    ED Treatments / Results  Labs (all labs ordered are listed, but only abnormal results are displayed) Labs Reviewed - No data to display  EKG None  Radiology No results found.  Procedures Procedures (including critical care time)  Medications Ordered in ED Medications - No data to display   Initial  Impression / Assessment and Plan / ED Course  I have reviewed the triage vital signs and the nursing notes.  Pertinent labs & imaging results that were available during my care of the patient were reviewed by me and considered in my medical decision making (see chart for details).     Patient appears nontoxic, non ill appearing playing on his cell phone holding phone with left hand.He states he is seen in the ED for gout and usually placed on steroids for symptomatic relieve. He was seen in the ED on 8/13 and placed on prednisone pack along with pain medication for pain.  Patient returns with swelling and pain to his left elbow.He has an appointment schedule with PCP next Saturday.He is advised to follow up for further management of symptoms. His vitals are stable, patient stable for discharge. Return precautions provided.   Final Clinical Impressions(s) / ED Diagnoses   Final diagnoses:  Left arm pain    ED Discharge Orders         Ordered    predniSONE (DELTASONE) 20 MG tablet  Daily     12/25/17 1145           Janeece Fitting, PA-C 12/25/17 1148    Maudie Flakes, MD 12/25/17 [phone]

## 2017-12-31 DIAGNOSIS — S91101A Unspecified open wound of right great toe without damage to nail, initial encounter: Secondary | ICD-10-CM | POA: Diagnosis not present

## 2017-12-31 DIAGNOSIS — W19XXXA Unspecified fall, initial encounter: Secondary | ICD-10-CM | POA: Diagnosis not present

## 2017-12-31 DIAGNOSIS — S90411A Abrasion, right great toe, initial encounter: Secondary | ICD-10-CM | POA: Diagnosis not present

## 2017-12-31 DIAGNOSIS — Y999 Unspecified external cause status: Secondary | ICD-10-CM | POA: Diagnosis not present

## 2017-12-31 DIAGNOSIS — S90931A Unspecified superficial injury of right great toe, initial encounter: Secondary | ICD-10-CM | POA: Diagnosis not present

## 2018-01-01 DIAGNOSIS — Z79891 Long term (current) use of opiate analgesic: Secondary | ICD-10-CM | POA: Diagnosis not present

## 2018-01-01 DIAGNOSIS — Z79899 Other long term (current) drug therapy: Secondary | ICD-10-CM | POA: Diagnosis not present

## 2018-01-01 DIAGNOSIS — Z886 Allergy status to analgesic agent status: Secondary | ICD-10-CM | POA: Diagnosis not present

## 2018-01-01 DIAGNOSIS — S98921A Partial traumatic amputation of right foot, level unspecified, initial encounter: Secondary | ICD-10-CM | POA: Diagnosis not present

## 2018-01-01 DIAGNOSIS — Z09 Encounter for follow-up examination after completed treatment for conditions other than malignant neoplasm: Secondary | ICD-10-CM | POA: Diagnosis not present

## 2018-01-01 DIAGNOSIS — I10 Essential (primary) hypertension: Secondary | ICD-10-CM | POA: Diagnosis not present

## 2018-01-01 DIAGNOSIS — E119 Type 2 diabetes mellitus without complications: Secondary | ICD-10-CM | POA: Diagnosis not present

## 2018-01-01 DIAGNOSIS — S99921A Unspecified injury of right foot, initial encounter: Secondary | ICD-10-CM | POA: Diagnosis not present

## 2018-01-01 DIAGNOSIS — Z794 Long term (current) use of insulin: Secondary | ICD-10-CM | POA: Diagnosis not present

## 2018-01-01 DIAGNOSIS — Z951 Presence of aortocoronary bypass graft: Secondary | ICD-10-CM | POA: Diagnosis not present

## 2018-01-01 DIAGNOSIS — I2510 Atherosclerotic heart disease of native coronary artery without angina pectoris: Secondary | ICD-10-CM | POA: Diagnosis not present

## 2018-01-01 DIAGNOSIS — G8911 Acute pain due to trauma: Secondary | ICD-10-CM | POA: Diagnosis not present

## 2018-01-01 DIAGNOSIS — S98121A Partial traumatic amputation of right great toe, initial encounter: Secondary | ICD-10-CM | POA: Diagnosis not present

## 2018-01-01 DIAGNOSIS — Z89411 Acquired absence of right great toe: Secondary | ICD-10-CM | POA: Diagnosis not present

## 2018-01-05 ENCOUNTER — Other Ambulatory Visit: Payer: Self-pay

## 2018-01-05 ENCOUNTER — Encounter (HOSPITAL_COMMUNITY): Payer: Self-pay

## 2018-01-05 ENCOUNTER — Emergency Department (HOSPITAL_COMMUNITY)
Admission: EM | Admit: 2018-01-05 | Discharge: 2018-01-05 | Disposition: A | Discharge status: Home / Self Care | Payer: Medicare Other | Attending: Emergency Medicine | Admitting: Emergency Medicine

## 2018-01-05 DIAGNOSIS — M109 Gout, unspecified: Secondary | ICD-10-CM | POA: Diagnosis not present

## 2018-01-05 DIAGNOSIS — I252 Old myocardial infarction: Secondary | ICD-10-CM | POA: Diagnosis not present

## 2018-01-05 DIAGNOSIS — I130 Hypertensive heart and chronic kidney disease with heart failure and stage 1 through stage 4 chronic kidney disease, or unspecified chronic kidney disease: Secondary | ICD-10-CM | POA: Diagnosis not present

## 2018-01-05 DIAGNOSIS — N183 Chronic kidney disease, stage 3 (moderate): Secondary | ICD-10-CM | POA: Insufficient documentation

## 2018-01-05 DIAGNOSIS — Z8673 Personal history of transient ischemic attack (TIA), and cerebral infarction without residual deficits: Secondary | ICD-10-CM | POA: Insufficient documentation

## 2018-01-05 DIAGNOSIS — I2510 Atherosclerotic heart disease of native coronary artery without angina pectoris: Secondary | ICD-10-CM | POA: Insufficient documentation

## 2018-01-05 DIAGNOSIS — Z79899 Other long term (current) drug therapy: Secondary | ICD-10-CM | POA: Insufficient documentation

## 2018-01-05 DIAGNOSIS — E1122 Type 2 diabetes mellitus with diabetic chronic kidney disease: Secondary | ICD-10-CM | POA: Diagnosis not present

## 2018-01-05 DIAGNOSIS — R531 Weakness: Secondary | ICD-10-CM | POA: Diagnosis not present

## 2018-01-05 DIAGNOSIS — Z794 Long term (current) use of insulin: Secondary | ICD-10-CM | POA: Diagnosis not present

## 2018-01-05 DIAGNOSIS — Z95810 Presence of automatic (implantable) cardiac defibrillator: Secondary | ICD-10-CM | POA: Diagnosis not present

## 2018-01-05 DIAGNOSIS — I255 Ischemic cardiomyopathy: Secondary | ICD-10-CM | POA: Insufficient documentation

## 2018-01-05 DIAGNOSIS — M79641 Pain in right hand: Secondary | ICD-10-CM | POA: Diagnosis not present

## 2018-01-05 DIAGNOSIS — I5022 Chronic systolic (congestive) heart failure: Secondary | ICD-10-CM | POA: Insufficient documentation

## 2018-01-05 DIAGNOSIS — Z7982 Long term (current) use of aspirin: Secondary | ICD-10-CM | POA: Insufficient documentation

## 2018-01-05 LAB — CBC WITH DIFFERENTIAL/PLATELET
BASOS ABS: 0 10*3/uL (ref 0.0–0.1)
BASOS PCT: 0 %
EOS ABS: 0 10*3/uL (ref 0.0–0.7)
EOS PCT: 0 %
HEMATOCRIT: 36.3 % — AB (ref 39.0–52.0)
Hemoglobin: 11.7 g/dL — ABNORMAL LOW (ref 13.0–17.0)
Lymphocytes Relative: 13 %
Lymphs Abs: 1.6 10*3/uL (ref 0.7–4.0)
MCH: 27.7 pg (ref 26.0–34.0)
MCHC: 32.2 g/dL (ref 30.0–36.0)
MCV: 86 fL (ref 78.0–100.0)
MONO ABS: 1 10*3/uL (ref 0.1–1.0)
MONOS PCT: 9 %
NEUTROS ABS: 9.4 10*3/uL — AB (ref 1.7–7.7)
Neutrophils Relative %: 78 %
PLATELETS: 184 10*3/uL (ref 150–400)
RBC: 4.22 MIL/uL (ref 4.22–5.81)
RDW: 17.1 % — AB (ref 11.5–15.5)
WBC: 12.1 10*3/uL — ABNORMAL HIGH (ref 4.0–10.5)

## 2018-01-05 LAB — BASIC METABOLIC PANEL
ANION GAP: 11 (ref 5–15)
BUN: 36 mg/dL — ABNORMAL HIGH (ref 6–20)
CALCIUM: 9 mg/dL (ref 8.9–10.3)
CO2: 22 mmol/L (ref 22–32)
Chloride: 105 mmol/L (ref 98–111)
Creatinine, Ser: 1.76 mg/dL — ABNORMAL HIGH (ref 0.61–1.24)
GFR, EST AFRICAN AMERICAN: 49 mL/min — AB (ref >60)
GFR, EST NON AFRICAN AMERICAN: 42 mL/min — AB (ref >60)
Glucose, Bld: 276 mg/dL — ABNORMAL HIGH (ref 70–99)
Potassium: 4.2 mmol/L (ref 3.5–5.1)
SODIUM: 138 mmol/L (ref 135–145)

## 2018-01-05 MED ORDER — PREDNISONE 50 MG PO TABS: 50.0000 mg | ORAL_TABLET | Freq: Every day | ORAL | 0 refills | Status: AC

## 2018-01-05 MED ORDER — METHYLPREDNISOLONE SODIUM SUCC 125 MG IJ SOLR
125.0000 mg | Freq: Once | INTRAMUSCULAR | Status: AC
  Administered 2018-01-05: 125 mg via INTRAMUSCULAR

## 2018-01-05 MED ORDER — METHYLPREDNISOLONE SODIUM SUCC 125 MG IJ SOLR
125.0000 mg | Freq: Once | INTRAMUSCULAR | Status: DC
  Filled 2018-01-05: qty 2

## 2018-01-05 MED ORDER — OXYCODONE-ACETAMINOPHEN 5-325 MG PO TABS: 1.0000 | ORAL_TABLET | ORAL | 0 refills | Status: DC | PRN

## 2018-01-05 MED ORDER — OXYCODONE-ACETAMINOPHEN 5-325 MG PO TABS
1.0000 | ORAL_TABLET | Freq: Once | ORAL | Status: AC
  Administered 2018-01-05: 1 via ORAL
  Filled 2018-01-05: qty 1

## 2018-01-05 NOTE — ED Triage Notes (Addendum)
Pt states he has a hx of gout, carpal tunnel, and cubital tunnel. Pt states that he has had pain x 3 days and has gotten progressively worse, going up his arm. Pt states he is concerned his gout is flaring up.

## 2018-01-05 NOTE — Discharge Instructions (Signed)
Your exam was consistent with gout flare similar to prior.  Your laboratory testing showed your kidney function was slightly worsened and we did not feel safe with NSAIDs today.  Please use the steroids starting tomorrow as you received 1 dose today.  Please follow-up with your orthopedic surgeon and your PCP.  If any symptoms change or worsen, please return to the nearest emergency department.

## 2018-01-05 NOTE — ED Provider Notes (Signed)
[facility] DEPT Provider Note   CSN: [id number] Arrival date & time: 01/05/18  0809     History   Chief Complaint Chief Complaint  Patient presents with  . Gout    HPI Paul [name]. is a 52 y.o. male.  The history is provided by the patient and medical records. No language interpreter was used.  Hand Pain  This is a recurrent problem. The current episode started more than 2 days ago. The problem occurs constantly. The problem has not changed since onset.Pertinent negatives include no chest pain, no abdominal pain, no headaches and no shortness of breath. Nothing aggravates the symptoms. Nothing relieves the symptoms. He has tried nothing for the symptoms. The treatment provided no relief.    Past Medical History:  Diagnosis Date  . Anemia   . Cardiac defibrillator in place    a. Biotronik LUmax 540 DRT, (ser # [id number]).  . Carotid arterial disease ([place])    a. s/p prior LICA stenting;  b. 05/8297 Carotid U/S: 40-59% bilat ICA stenosis. Patent LICA stent.  . CKD (chronic kidney disease), stage III ([place])   . Coronary artery disease    a. 2010 s/p CABG x 3.  . DDD (degenerative disc disease), lumbosacral    L5-S1  . Diabetes ([place])    Lantus at bedtime  . Gangrene of toe of right foot ([place])   . Gout of left hand 10/06/2016  . HFrEF (heart failure with reduced ejection fraction) ([place])    a. 07/2016 Echo: EF 25-30%, diff HK, mild MR, mildly dil LA, mod reduced RV fxn, PASP 87mmHg.  Marland Kitchen Hypertension    takes Coreg daily  . Ischemic cardiomyopathy    a. 07/2016 Echo: EF 25-30%, diff HK.  . Myocardial infarction ([place]) 2009  . Peripheral vascular disease ([place])   . Sleep apnea    sleep study yr ago-unable to afford cpap  . Stroke [facility]) 09   no weakness    Patient Active Problem List   Diagnosis Date Noted  . CKD (chronic kidney disease), stage III ([place]) 12/15/2016  . Ulcerated, foot () 12/15/2016  . Gangrene of foot ([place]) 08/21/2016  .  HTN (hypertension) 08/21/2016  . Diabetes ([place]) 08/21/2016  . CAD (coronary artery disease) 08/21/2016  . Chronic systolic CHF (congestive heart failure) ([place]) 08/21/2016  . Diabetic foot infection ([place]) 08/21/2016  . Heterotopic ossification of bone 07/12/2014    Past Surgical History:  Procedure Laterality Date  . AMPUTATION TOE Right 08/22/2016   Procedure: AMPUTATION TOE;  Surgeon: Samara Deist, DPM;  Location: [facility] ORS;  Service: Podiatry;  Laterality: Right;  . AMPUTATION TOE Right 10/09/2016   Procedure: AMPUTATION TOE-RIGHT 2ND MPJ;  Surgeon: Samara Deist, DPM;  Location: [facility] ORS;  Service: Podiatry;  Laterality: Right;  . APLIGRAFT PLACEMENT Right 10/09/2016   Procedure: APLIGRAFT PLACEMENT;  Surgeon: Samara Deist, DPM;  Location: [facility] ORS;  Service: Podiatry;  Laterality: Right;  . CARDIAC DEFIBRILLATOR PLACEMENT  2011  . CAROTID ENDARTERECTOMY Left   . CHOLECYSTECTOMY  2010  . CORONARY ARTERY BYPASS GRAFT  2010   CABG x 3   . GRAFT APPLICATION Right 3/71/6967   Procedure: FULL THICKNESS SKIN GRAFT-RIGHT FOOT;  Surgeon: Algernon Huxley, MD;  Location: [facility] ORS;  Service: Vascular;  Laterality: Right;  . HIP SURGERY Right 1994  . INCISION AND DRAINAGE OF WOUND Right 08/22/2016   Procedure: IRRIGATION AND DEBRIDEMENT WOUND and wound vac placement;  Surgeon: Samara Deist, DPM;  Location: [facility]  ORS;  Service: Podiatry;  Laterality: Right;  . LOWER EXTREMITY ANGIOGRAPHY Right 08/24/2016   Procedure: Lower Extremity Angiography;  Surgeon: Algernon Huxley, MD;  Location: [facility];  Service: Cardiovascular;  Laterality: Right;  . MASS EXCISION Right 07/18/2014   Procedure: EXCISION HETEROTOPIC BONE RIGHT HIP;  Surgeon: Frederik Pear, MD;  Location: [facility];  Service: Orthopedics;  Laterality: Right;  . Open Heart Surgery  2010   x 3  . WOUND DEBRIDEMENT Right 10/09/2016   Procedure: DEBRIDEMENT WOUND;  Surgeon: Samara Deist, DPM;  Location: [facility] ORS;  Service: Podiatry;  Laterality:  Right;        Home Medications    Prior to Admission medications   Medication Sig Start Date End Date Taking? Authorizing Provider  aspirin EC 81 MG tablet Take 1 tablet (81 mg total) by mouth daily. 10/21/16  Yes Theora Gianotti, NP  carvedilol (COREG) 12.5 MG tablet Take 12.5 mg by mouth 2 (two) times daily.    Yes [provider]  diphenhydramine-acetaminophen (TYLENOL PM EXTRA STRENGTH) 25-500 MG TABS tablet Take 2 tablets by mouth at bedtime as needed (mild pain).   Yes [provider]  insulin glargine (LANTUS) 100 unit/mL SOPN Inject 30 Units into the skin daily.   Yes [provider]  colchicine 0.6 MG tablet Take 1 tablet (0.6 mg total) by mouth daily as needed. Patient not taking: Reported on 01/05/2018 08/27/16   Gladstone Lighter, MD  oxyCODONE-acetaminophen (ROXICET) 5-325 MG tablet Take 1 tablet by mouth every 6 (six) hours as needed. Patient not taking: Reported on 01/05/2018 01/06/17   Loney Hering, MD  torsemide (DEMADEX) 20 MG tablet Take 1 tablet (20 mg total) by mouth daily. Patient not taking: Reported on 01/05/2018 10/21/16   Theora Gianotti, NP    Family History Family History  Problem Relation Age of Onset  . Hypertension Other   . Diabetes Other   . Diabetes Father   . Hyperlipidemia Father   . Hypertension Father   . Hyperlipidemia Sister   . Hypertension Sister   . Diabetes Sister   . Diabetes Brother   . Hypertension Brother   . Hyperlipidemia Brother     Social History Social History   Tobacco Use  . Smoking status: Never Smoker  . Smokeless tobacco: Never Used  Substance Use Topics  . Alcohol use: No  . Drug use: No     Allergies   Ibuprofen; Metformin; and Nsaids   Review of Systems Review of Systems  Constitutional: Negative for chills, diaphoresis, fatigue and fever.  HENT: Negative for congestion.   Eyes: Negative for visual disturbance.  Respiratory: Negative for cough, chest  tightness, shortness of breath, wheezing and stridor.   Cardiovascular: Negative for chest pain.  Gastrointestinal: Negative for abdominal pain, diarrhea, nausea and vomiting.  Genitourinary: Negative for dysuria, flank pain and frequency.  Musculoskeletal: Negative for neck pain and neck stiffness.  Skin: Negative for rash and wound.  Neurological: Positive for weakness and numbness (at baseline). Negative for light-headedness and headaches.  All other systems reviewed and are negative.    Physical Exam Updated Vital Signs BP (!) 150/88   Pulse 95   Temp 98 F (36.7 C) (Oral)   Resp 15   Ht 5\' 10"  (1.778 m)   Wt 95.3 kg   SpO2 100%   BMI 30.13 kg/m   Physical Exam  Constitutional: He is oriented to person, place, and time. He appears well-developed and well-nourished. No  distress.  HENT:  Head: Normocephalic and atraumatic.  Mouth/Throat: Oropharynx is clear and moist. No oropharyngeal exudate.  Eyes: Pupils are equal, round, and reactive to light. Conjunctivae and EOM are normal.  Neck: Neck supple.  Cardiovascular: Normal rate and regular rhythm.  No murmur heard. Pulmonary/Chest: Effort normal and breath sounds normal. No respiratory distress. He has no wheezes. He has no rales. He exhibits no tenderness.  Abdominal: Soft. There is no tenderness. There is no guarding.  Musculoskeletal: He exhibits tenderness. He exhibits no edema.       Right hand: He exhibits tenderness. He exhibits normal capillary refill. Decreased sensation noted. Decreased strength noted.       Hands: Patient has numbness and grip strength decreased on right side compared to left which he reports is unchanged due to his carpal tunnel and gout.  He has tenderness across the knuckles on his right hand.  No snuffbox tenderness or wrist tenderness.  Reports this is similar to prior gout.  Lymphadenopathy:    He has no cervical adenopathy.  Neurological: He is alert and oriented to person, place, and  time. A sensory deficit is present. No cranial nerve deficit. He exhibits abnormal muscle tone. Coordination normal.  Skin: Skin is warm and dry. He is not diaphoretic. No erythema. No pallor.  Psychiatric: He has a normal mood and affect.  Nursing note and vitals reviewed.    ED Treatments / Results  Labs (all labs ordered are listed, but only abnormal results are displayed) Labs Reviewed  CBC WITH DIFFERENTIAL/PLATELET - Abnormal; Notable for the following components:      Result Value   WBC 12.1 (*)    Hemoglobin 11.7 (*)    HCT 36.3 (*)    RDW 17.1 (*)    Neutro Abs 9.4 (*)    All other components within normal limits  BASIC METABOLIC PANEL - Abnormal; Notable for the following components:   Glucose, Bld 276 (*)    BUN 36 (*)    Creatinine, Ser 1.76 (*)    GFR calc non Af Amer 42 (*)    GFR calc Af Amer 49 (*)    All other components within normal limits    EKG None  Radiology No results found.  Procedures Procedures (including critical care time)  Medications Ordered in ED Medications  methylPREDNISolone sodium succinate (SOLU-MEDROL) 125 mg/2 mL injection 125 mg (has no administration in time range)  oxyCODONE-acetaminophen (PERCOCET/ROXICET) 5-325 MG per tablet 1 tablet (1 tablet Oral Given 01/05/18 1013)     Initial Impression / Assessment and Plan / ED Course  I have reviewed the triage vital signs and the nursing notes.  Pertinent labs & imaging results that were available during my care of the patient were reviewed by me and considered in my medical decision making (see chart for details).     Graig Hessling. is a 52 y.o. male with a past medical history significant for CAD, diabetes, peripheral vascular disease, CKD, stroke, CHF, gout, and carpal tunnel who presents with right hand pain.  Patient says that he is scheduled to have surgery on his cuboid and carpal tunnels on his right extremity next month.  He says that he has had recurrent gout flares in  his right hand making his discomfort worsened.  He reports that recently he was treated with steroids and allopurinol however due to his kidney function worsened several months ago he was not able to take colchicine.  He says that he has  no new trauma.  He reports the pain is moderate to severe.  He denies any recent fevers, chills, cough, congestion, urinary symptoms or GI symptoms.  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  He has no complaints in this location and does not want it examined.    On my exam, patient has tenderness across his knuckles on his right hand.  He has decreased grip strength which he reports is unchanged from baseline.  He reports some numbness and tingling in his right hand which he says is due to the carpal tunnel.  He denies any other changes.  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    Clinical I suspect patient is having a gout flare.  Patient will likely be given prescription for steroids similar to prior however, he has not had his kidney function checked recently.  Patient will have his labs checked in order to see if colchicine is appropriate.  Anticipate patient will be discharged after work-up.  12:00 PM Unfortunately, patient's lab testing showed his creatinine has trended up from last visit.  Still do not think he is safe for NSAIDs at this time.  Next  Patient will have her get a dose of Solu-Medrol which he reports has helped him before and given a short course of steroids.  He will also be given prescription for pain medication which helped him significantly today.    Patient will follow-up with his orthopedic surgeon and his PCP for further management.  Patient understands the plan of care as well as return precautions.  He had no depressions or concerns and will be discharged.   Final Clinical Impressions(s) / ED Diagnoses   Final diagnoses:    Right hand pain  Gout of right hand, unspecified cause, unspecified chronicity    ED Discharge Orders         Ordered    predniSONE (DELTASONE) 50 MG tablet  Daily     01/05/18 1203    oxyCODONE-acetaminophen (PERCOCET/ROXICET) 5-325 MG tablet  Every 4 hours PRN     01/05/18 1203          Clinical Impression: 1. Right hand pain   2. Gout of right hand, unspecified cause, unspecified chronicity     Disposition: Discharge  Condition: Good  I have discussed the results, Dx and Tx plan with the pt(& family if present). He/she/they expressed understanding and agree(s) with the plan. Discharge instructions discussed at great length. Strict return precautions discussed and pt &/or family have verbalized understanding of the instructions. No further questions at time of discharge.    New Prescriptions   OXYCODONE-ACETAMINOPHEN (PERCOCET/ROXICET) 5-325 MG TABLET    Take 1 tablet by mouth every 4 (four) hours as needed for severe pain.   PREDNISONE (DELTASONE) 50 MG TABLET    Take 1 tablet (50 mg total) by mouth daily for 4 days.    Follow Up: Seward Carol, MD [address] [place] [address] [phone]     [facility]-EMERGENCY DEPT [place] [id number] mc [address] [place]       Latrina Guttman, Gwenyth Allegra, MD 01/05/18 [phone]

## 2018-01-05 NOTE — ED Notes (Signed)
Lab at bedside

## 2018-01-06 DIAGNOSIS — Z89411 Acquired absence of right great toe: Secondary | ICD-10-CM | POA: Diagnosis not present

## 2018-01-06 DIAGNOSIS — T8189XD Other complications of procedures, not elsewhere classified, subsequent encounter: Secondary | ICD-10-CM | POA: Diagnosis not present

## 2018-01-06 DIAGNOSIS — L97514 Non-pressure chronic ulcer of other part of right foot with necrosis of bone: Secondary | ICD-10-CM | POA: Diagnosis not present

## 2018-01-06 DIAGNOSIS — I70235 Atherosclerosis of native arteries of right leg with ulceration of other part of foot: Secondary | ICD-10-CM | POA: Diagnosis not present

## 2018-01-06 DIAGNOSIS — E11621 Type 2 diabetes mellitus with foot ulcer: Secondary | ICD-10-CM | POA: Diagnosis not present

## 2018-01-10 ENCOUNTER — Telehealth: Payer: Self-pay

## 2018-01-10 NOTE — Telephone Encounter (Signed)
Multiple attempts to contact patient .  Bad number.  Mailed Letter.

## 2018-01-11 DIAGNOSIS — N189 Chronic kidney disease, unspecified: Secondary | ICD-10-CM | POA: Diagnosis not present

## 2018-01-11 DIAGNOSIS — S0003XA Contusion of scalp, initial encounter: Secondary | ICD-10-CM | POA: Diagnosis not present

## 2018-01-11 DIAGNOSIS — I951 Orthostatic hypotension: Secondary | ICD-10-CM | POA: Diagnosis present

## 2018-01-11 DIAGNOSIS — G5623 Lesion of ulnar nerve, bilateral upper limbs: Secondary | ICD-10-CM | POA: Diagnosis not present

## 2018-01-11 DIAGNOSIS — E1140 Type 2 diabetes mellitus with diabetic neuropathy, unspecified: Secondary | ICD-10-CM | POA: Diagnosis not present

## 2018-01-11 DIAGNOSIS — I255 Ischemic cardiomyopathy: Secondary | ICD-10-CM | POA: Diagnosis present

## 2018-01-11 DIAGNOSIS — S3210XA Unspecified fracture of sacrum, initial encounter for closed fracture: Secondary | ICD-10-CM | POA: Diagnosis not present

## 2018-01-11 DIAGNOSIS — K2960 Other gastritis without bleeding: Secondary | ICD-10-CM | POA: Diagnosis not present

## 2018-01-11 DIAGNOSIS — K2970 Gastritis, unspecified, without bleeding: Secondary | ICD-10-CM | POA: Diagnosis present

## 2018-01-11 DIAGNOSIS — I5022 Chronic systolic (congestive) heart failure: Secondary | ICD-10-CM | POA: Diagnosis not present

## 2018-01-11 DIAGNOSIS — N179 Acute kidney failure, unspecified: Secondary | ICD-10-CM | POA: Diagnosis present

## 2018-01-11 DIAGNOSIS — E1143 Type 2 diabetes mellitus with diabetic autonomic (poly)neuropathy: Secondary | ICD-10-CM | POA: Diagnosis present

## 2018-01-11 DIAGNOSIS — E875 Hyperkalemia: Secondary | ICD-10-CM | POA: Diagnosis not present

## 2018-01-11 DIAGNOSIS — E876 Hypokalemia: Secondary | ICD-10-CM | POA: Diagnosis not present

## 2018-01-11 DIAGNOSIS — I5023 Acute on chronic systolic (congestive) heart failure: Secondary | ICD-10-CM | POA: Diagnosis not present

## 2018-01-11 DIAGNOSIS — Z89421 Acquired absence of other right toe(s): Secondary | ICD-10-CM | POA: Diagnosis not present

## 2018-01-11 DIAGNOSIS — R937 Abnormal findings on diagnostic imaging of other parts of musculoskeletal system: Secondary | ICD-10-CM | POA: Diagnosis not present

## 2018-01-11 DIAGNOSIS — E1122 Type 2 diabetes mellitus with diabetic chronic kidney disease: Secondary | ICD-10-CM | POA: Diagnosis present

## 2018-01-11 DIAGNOSIS — I248 Other forms of acute ischemic heart disease: Secondary | ICD-10-CM | POA: Diagnosis not present

## 2018-01-11 DIAGNOSIS — Z9181 History of falling: Secondary | ICD-10-CM | POA: Diagnosis not present

## 2018-01-11 DIAGNOSIS — E119 Type 2 diabetes mellitus without complications: Secondary | ICD-10-CM | POA: Diagnosis not present

## 2018-01-11 DIAGNOSIS — R000 Tachycardia, unspecified: Secondary | ICD-10-CM | POA: Diagnosis not present

## 2018-01-11 DIAGNOSIS — I480 Paroxysmal atrial fibrillation: Secondary | ICD-10-CM | POA: Diagnosis not present

## 2018-01-11 DIAGNOSIS — I4891 Unspecified atrial fibrillation: Secondary | ICD-10-CM | POA: Diagnosis present

## 2018-01-11 DIAGNOSIS — I129 Hypertensive chronic kidney disease with stage 1 through stage 4 chronic kidney disease, or unspecified chronic kidney disease: Secondary | ICD-10-CM | POA: Diagnosis not present

## 2018-01-11 DIAGNOSIS — I4892 Unspecified atrial flutter: Secondary | ICD-10-CM | POA: Diagnosis present

## 2018-01-11 DIAGNOSIS — G5603 Carpal tunnel syndrome, bilateral upper limbs: Secondary | ICD-10-CM | POA: Diagnosis not present

## 2018-01-11 DIAGNOSIS — Z951 Presence of aortocoronary bypass graft: Secondary | ICD-10-CM | POA: Diagnosis not present

## 2018-01-11 DIAGNOSIS — G4733 Obstructive sleep apnea (adult) (pediatric): Secondary | ICD-10-CM | POA: Diagnosis present

## 2018-01-11 DIAGNOSIS — Z95810 Presence of automatic (implantable) cardiac defibrillator: Secondary | ICD-10-CM | POA: Diagnosis not present

## 2018-01-11 DIAGNOSIS — R600 Localized edema: Secondary | ICD-10-CM | POA: Diagnosis not present

## 2018-01-11 DIAGNOSIS — I493 Ventricular premature depolarization: Secondary | ICD-10-CM | POA: Diagnosis not present

## 2018-01-11 DIAGNOSIS — E1165 Type 2 diabetes mellitus with hyperglycemia: Secondary | ICD-10-CM | POA: Diagnosis present

## 2018-01-11 DIAGNOSIS — E11621 Type 2 diabetes mellitus with foot ulcer: Secondary | ICD-10-CM | POA: Diagnosis present

## 2018-01-11 DIAGNOSIS — I739 Peripheral vascular disease, unspecified: Secondary | ICD-10-CM | POA: Diagnosis not present

## 2018-01-11 DIAGNOSIS — Z89411 Acquired absence of right great toe: Secondary | ICD-10-CM | POA: Diagnosis not present

## 2018-01-11 DIAGNOSIS — N183 Chronic kidney disease, stage 3 (moderate): Secondary | ICD-10-CM | POA: Diagnosis not present

## 2018-01-11 DIAGNOSIS — E1142 Type 2 diabetes mellitus with diabetic polyneuropathy: Secondary | ICD-10-CM | POA: Diagnosis present

## 2018-01-11 DIAGNOSIS — Y999 Unspecified external cause status: Secondary | ICD-10-CM | POA: Diagnosis not present

## 2018-01-11 DIAGNOSIS — L97514 Non-pressure chronic ulcer of other part of right foot with necrosis of bone: Secondary | ICD-10-CM | POA: Diagnosis not present

## 2018-01-11 DIAGNOSIS — I472 Ventricular tachycardia: Secondary | ICD-10-CM | POA: Diagnosis present

## 2018-01-11 DIAGNOSIS — I70235 Atherosclerosis of native arteries of right leg with ulceration of other part of foot: Secondary | ICD-10-CM | POA: Diagnosis not present

## 2018-01-11 DIAGNOSIS — S3219XA Other fracture of sacrum, initial encounter for closed fracture: Secondary | ICD-10-CM | POA: Diagnosis not present

## 2018-01-11 DIAGNOSIS — S0990XA Unspecified injury of head, initial encounter: Secondary | ICD-10-CM | POA: Diagnosis not present

## 2018-01-11 DIAGNOSIS — I459 Conduction disorder, unspecified: Secondary | ICD-10-CM | POA: Diagnosis present

## 2018-01-11 DIAGNOSIS — M542 Cervicalgia: Secondary | ICD-10-CM | POA: Diagnosis not present

## 2018-01-11 DIAGNOSIS — W01198A Fall on same level from slipping, tripping and stumbling with subsequent striking against other object, initial encounter: Secondary | ICD-10-CM | POA: Diagnosis not present

## 2018-01-11 DIAGNOSIS — G5620 Lesion of ulnar nerve, unspecified upper limb: Secondary | ICD-10-CM | POA: Diagnosis present

## 2018-01-11 DIAGNOSIS — S199XXA Unspecified injury of neck, initial encounter: Secondary | ICD-10-CM | POA: Diagnosis not present

## 2018-01-11 DIAGNOSIS — I130 Hypertensive heart and chronic kidney disease with heart failure and stage 1 through stage 4 chronic kidney disease, or unspecified chronic kidney disease: Secondary | ICD-10-CM | POA: Diagnosis present

## 2018-01-11 DIAGNOSIS — N289 Disorder of kidney and ureter, unspecified: Secondary | ICD-10-CM | POA: Diagnosis not present

## 2018-01-11 DIAGNOSIS — Z794 Long term (current) use of insulin: Secondary | ICD-10-CM | POA: Diagnosis not present

## 2018-01-11 DIAGNOSIS — R55 Syncope and collapse: Secondary | ICD-10-CM | POA: Diagnosis not present

## 2018-01-11 DIAGNOSIS — N184 Chronic kidney disease, stage 4 (severe): Secondary | ICD-10-CM | POA: Diagnosis present

## 2018-01-11 DIAGNOSIS — R202 Paresthesia of skin: Secondary | ICD-10-CM | POA: Diagnosis not present

## 2018-01-11 DIAGNOSIS — E1151 Type 2 diabetes mellitus with diabetic peripheral angiopathy without gangrene: Secondary | ICD-10-CM | POA: Diagnosis present

## 2018-01-11 DIAGNOSIS — E860 Dehydration: Secondary | ICD-10-CM | POA: Diagnosis present

## 2018-01-11 DIAGNOSIS — G729 Myopathy, unspecified: Secondary | ICD-10-CM | POA: Diagnosis not present

## 2018-01-11 DIAGNOSIS — I071 Rheumatic tricuspid insufficiency: Secondary | ICD-10-CM | POA: Diagnosis present

## 2018-01-11 DIAGNOSIS — I959 Hypotension, unspecified: Secondary | ICD-10-CM | POA: Diagnosis not present

## 2018-01-11 DIAGNOSIS — M4802 Spinal stenosis, cervical region: Secondary | ICD-10-CM | POA: Diagnosis not present

## 2018-01-11 DIAGNOSIS — W19XXXA Unspecified fall, initial encounter: Secondary | ICD-10-CM | POA: Diagnosis not present

## 2018-01-11 DIAGNOSIS — I272 Pulmonary hypertension, unspecified: Secondary | ICD-10-CM | POA: Diagnosis present

## 2018-01-11 DIAGNOSIS — L97519 Non-pressure chronic ulcer of other part of right foot with unspecified severity: Secondary | ICD-10-CM | POA: Diagnosis not present

## 2018-01-11 DIAGNOSIS — S6991XA Unspecified injury of right wrist, hand and finger(s), initial encounter: Secondary | ICD-10-CM | POA: Diagnosis not present

## 2018-01-11 DIAGNOSIS — I2510 Atherosclerotic heart disease of native coronary artery without angina pectoris: Secondary | ICD-10-CM | POA: Diagnosis present

## 2018-01-11 DIAGNOSIS — D631 Anemia in chronic kidney disease: Secondary | ICD-10-CM | POA: Diagnosis not present

## 2018-01-19 DIAGNOSIS — I951 Orthostatic hypotension: Secondary | ICD-10-CM | POA: Diagnosis not present

## 2018-01-19 DIAGNOSIS — A4101 Sepsis due to Methicillin susceptible Staphylococcus aureus: Secondary | ICD-10-CM | POA: Diagnosis present

## 2018-01-19 DIAGNOSIS — M5127 Other intervertebral disc displacement, lumbosacral region: Secondary | ICD-10-CM | POA: Diagnosis not present

## 2018-01-19 DIAGNOSIS — T148XXD Other injury of unspecified body region, subsequent encounter: Secondary | ICD-10-CM | POA: Diagnosis not present

## 2018-01-19 DIAGNOSIS — I5020 Unspecified systolic (congestive) heart failure: Secondary | ICD-10-CM | POA: Diagnosis not present

## 2018-01-19 DIAGNOSIS — Z89421 Acquired absence of other right toe(s): Secondary | ICD-10-CM | POA: Diagnosis not present

## 2018-01-19 DIAGNOSIS — R509 Fever, unspecified: Secondary | ICD-10-CM | POA: Diagnosis not present

## 2018-01-19 DIAGNOSIS — Z794 Long term (current) use of insulin: Secondary | ICD-10-CM | POA: Diagnosis not present

## 2018-01-19 DIAGNOSIS — S91101A Unspecified open wound of right great toe without damage to nail, initial encounter: Secondary | ICD-10-CM | POA: Diagnosis not present

## 2018-01-19 DIAGNOSIS — D631 Anemia in chronic kidney disease: Secondary | ICD-10-CM | POA: Diagnosis not present

## 2018-01-19 DIAGNOSIS — G8929 Other chronic pain: Secondary | ICD-10-CM | POA: Diagnosis not present

## 2018-01-19 DIAGNOSIS — M869 Osteomyelitis, unspecified: Secondary | ICD-10-CM | POA: Diagnosis not present

## 2018-01-19 DIAGNOSIS — N170 Acute kidney failure with tubular necrosis: Secondary | ICD-10-CM | POA: Diagnosis not present

## 2018-01-19 DIAGNOSIS — M79604 Pain in right leg: Secondary | ICD-10-CM | POA: Diagnosis not present

## 2018-01-19 DIAGNOSIS — I96 Gangrene, not elsewhere classified: Secondary | ICD-10-CM | POA: Diagnosis not present

## 2018-01-19 DIAGNOSIS — T148XXA Other injury of unspecified body region, initial encounter: Secondary | ICD-10-CM | POA: Diagnosis not present

## 2018-01-19 DIAGNOSIS — I5022 Chronic systolic (congestive) heart failure: Secondary | ICD-10-CM | POA: Diagnosis present

## 2018-01-19 DIAGNOSIS — I129 Hypertensive chronic kidney disease with stage 1 through stage 4 chronic kidney disease, or unspecified chronic kidney disease: Secondary | ICD-10-CM | POA: Diagnosis not present

## 2018-01-19 DIAGNOSIS — E1122 Type 2 diabetes mellitus with diabetic chronic kidney disease: Secondary | ICD-10-CM | POA: Diagnosis present

## 2018-01-19 DIAGNOSIS — N179 Acute kidney failure, unspecified: Secondary | ICD-10-CM | POA: Diagnosis not present

## 2018-01-19 DIAGNOSIS — A419 Sepsis, unspecified organism: Secondary | ICD-10-CM | POA: Diagnosis not present

## 2018-01-19 DIAGNOSIS — Z9989 Dependence on other enabling machines and devices: Secondary | ICD-10-CM | POA: Diagnosis not present

## 2018-01-19 DIAGNOSIS — M7022 Olecranon bursitis, left elbow: Secondary | ICD-10-CM | POA: Diagnosis not present

## 2018-01-19 DIAGNOSIS — F458 Other somatoform disorders: Secondary | ICD-10-CM | POA: Diagnosis not present

## 2018-01-19 DIAGNOSIS — E785 Hyperlipidemia, unspecified: Secondary | ICD-10-CM | POA: Diagnosis not present

## 2018-01-19 DIAGNOSIS — R102 Pelvic and perineal pain: Secondary | ICD-10-CM | POA: Diagnosis not present

## 2018-01-19 DIAGNOSIS — I959 Hypotension, unspecified: Secondary | ICD-10-CM | POA: Diagnosis not present

## 2018-01-19 DIAGNOSIS — Z96641 Presence of right artificial hip joint: Secondary | ICD-10-CM | POA: Diagnosis not present

## 2018-01-19 DIAGNOSIS — E1169 Type 2 diabetes mellitus with other specified complication: Secondary | ICD-10-CM | POA: Diagnosis not present

## 2018-01-19 DIAGNOSIS — I471 Supraventricular tachycardia: Secondary | ICD-10-CM | POA: Diagnosis not present

## 2018-01-19 DIAGNOSIS — E11621 Type 2 diabetes mellitus with foot ulcer: Secondary | ICD-10-CM | POA: Diagnosis present

## 2018-01-19 DIAGNOSIS — E1140 Type 2 diabetes mellitus with diabetic neuropathy, unspecified: Secondary | ICD-10-CM | POA: Diagnosis not present

## 2018-01-19 DIAGNOSIS — M7989 Other specified soft tissue disorders: Secondary | ICD-10-CM | POA: Diagnosis not present

## 2018-01-19 DIAGNOSIS — L97514 Non-pressure chronic ulcer of other part of right foot with necrosis of bone: Secondary | ICD-10-CM | POA: Diagnosis not present

## 2018-01-19 DIAGNOSIS — Z89422 Acquired absence of other left toe(s): Secondary | ICD-10-CM | POA: Diagnosis not present

## 2018-01-19 DIAGNOSIS — R262 Difficulty in walking, not elsewhere classified: Secondary | ICD-10-CM | POA: Diagnosis not present

## 2018-01-19 DIAGNOSIS — R7881 Bacteremia: Secondary | ICD-10-CM | POA: Diagnosis not present

## 2018-01-19 DIAGNOSIS — Z89431 Acquired absence of right foot: Secondary | ICD-10-CM | POA: Diagnosis not present

## 2018-01-19 DIAGNOSIS — D638 Anemia in other chronic diseases classified elsewhere: Secondary | ICD-10-CM | POA: Diagnosis not present

## 2018-01-19 DIAGNOSIS — Z833 Family history of diabetes mellitus: Secondary | ICD-10-CM | POA: Diagnosis not present

## 2018-01-19 DIAGNOSIS — M86171 Other acute osteomyelitis, right ankle and foot: Secondary | ICD-10-CM | POA: Diagnosis not present

## 2018-01-19 DIAGNOSIS — M6281 Muscle weakness (generalized): Secondary | ICD-10-CM | POA: Diagnosis not present

## 2018-01-19 DIAGNOSIS — A4901 Methicillin susceptible Staphylococcus aureus infection, unspecified site: Secondary | ICD-10-CM | POA: Diagnosis not present

## 2018-01-19 DIAGNOSIS — E1165 Type 2 diabetes mellitus with hyperglycemia: Secondary | ICD-10-CM | POA: Diagnosis present

## 2018-01-19 DIAGNOSIS — G894 Chronic pain syndrome: Secondary | ICD-10-CM | POA: Diagnosis not present

## 2018-01-19 DIAGNOSIS — S3219XA Other fracture of sacrum, initial encounter for closed fracture: Secondary | ICD-10-CM | POA: Diagnosis not present

## 2018-01-19 DIAGNOSIS — R41841 Cognitive communication deficit: Secondary | ICD-10-CM | POA: Diagnosis not present

## 2018-01-19 DIAGNOSIS — B9561 Methicillin susceptible Staphylococcus aureus infection as the cause of diseases classified elsewhere: Secondary | ICD-10-CM | POA: Diagnosis not present

## 2018-01-19 DIAGNOSIS — D649 Anemia, unspecified: Secondary | ICD-10-CM | POA: Diagnosis not present

## 2018-01-19 DIAGNOSIS — Z89411 Acquired absence of right great toe: Secondary | ICD-10-CM | POA: Diagnosis not present

## 2018-01-19 DIAGNOSIS — I110 Hypertensive heart disease with heart failure: Secondary | ICD-10-CM | POA: Diagnosis not present

## 2018-01-19 DIAGNOSIS — S3210XD Unspecified fracture of sacrum, subsequent encounter for fracture with routine healing: Secondary | ICD-10-CM | POA: Diagnosis not present

## 2018-01-19 DIAGNOSIS — M868X7 Other osteomyelitis, ankle and foot: Secondary | ICD-10-CM | POA: Diagnosis not present

## 2018-01-19 DIAGNOSIS — Z79899 Other long term (current) drug therapy: Secondary | ICD-10-CM | POA: Diagnosis not present

## 2018-01-19 DIAGNOSIS — G5603 Carpal tunnel syndrome, bilateral upper limbs: Secondary | ICD-10-CM | POA: Diagnosis present

## 2018-01-19 DIAGNOSIS — Z7952 Long term (current) use of systemic steroids: Secondary | ICD-10-CM | POA: Diagnosis not present

## 2018-01-19 DIAGNOSIS — Z95810 Presence of automatic (implantable) cardiac defibrillator: Secondary | ICD-10-CM | POA: Diagnosis not present

## 2018-01-19 DIAGNOSIS — E1151 Type 2 diabetes mellitus with diabetic peripheral angiopathy without gangrene: Secondary | ICD-10-CM | POA: Diagnosis present

## 2018-01-19 DIAGNOSIS — M109 Gout, unspecified: Secondary | ICD-10-CM | POA: Diagnosis not present

## 2018-01-19 DIAGNOSIS — M86671 Other chronic osteomyelitis, right ankle and foot: Secondary | ICD-10-CM | POA: Diagnosis present

## 2018-01-19 DIAGNOSIS — Z8249 Family history of ischemic heart disease and other diseases of the circulatory system: Secondary | ICD-10-CM | POA: Diagnosis not present

## 2018-01-19 DIAGNOSIS — S91301A Unspecified open wound, right foot, initial encounter: Secondary | ICD-10-CM | POA: Diagnosis not present

## 2018-01-19 DIAGNOSIS — T7411XA Adult physical abuse, confirmed, initial encounter: Secondary | ICD-10-CM | POA: Diagnosis not present

## 2018-01-19 DIAGNOSIS — D72829 Elevated white blood cell count, unspecified: Secondary | ICD-10-CM | POA: Diagnosis not present

## 2018-01-19 DIAGNOSIS — N183 Chronic kidney disease, stage 3 (moderate): Secondary | ICD-10-CM | POA: Diagnosis present

## 2018-01-19 DIAGNOSIS — Y999 Unspecified external cause status: Secondary | ICD-10-CM | POA: Diagnosis not present

## 2018-01-19 DIAGNOSIS — T7611XA Adult physical abuse, suspected, initial encounter: Secondary | ICD-10-CM | POA: Diagnosis present

## 2018-01-19 DIAGNOSIS — Z452 Encounter for adjustment and management of vascular access device: Secondary | ICD-10-CM | POA: Diagnosis not present

## 2018-01-19 DIAGNOSIS — I509 Heart failure, unspecified: Secondary | ICD-10-CM | POA: Diagnosis not present

## 2018-01-19 DIAGNOSIS — I2510 Atherosclerotic heart disease of native coronary artery without angina pectoris: Secondary | ICD-10-CM | POA: Diagnosis present

## 2018-01-19 DIAGNOSIS — Z7982 Long term (current) use of aspirin: Secondary | ICD-10-CM | POA: Diagnosis not present

## 2018-01-19 DIAGNOSIS — M48061 Spinal stenosis, lumbar region without neurogenic claudication: Secondary | ICD-10-CM | POA: Diagnosis not present

## 2018-01-19 DIAGNOSIS — I130 Hypertensive heart and chronic kidney disease with heart failure and stage 1 through stage 4 chronic kidney disease, or unspecified chronic kidney disease: Secondary | ICD-10-CM | POA: Diagnosis present

## 2018-01-19 DIAGNOSIS — M545 Low back pain: Secondary | ICD-10-CM | POA: Diagnosis not present

## 2018-01-19 DIAGNOSIS — S3210XA Unspecified fracture of sacrum, initial encounter for closed fracture: Secondary | ICD-10-CM | POA: Diagnosis present

## 2018-01-19 DIAGNOSIS — M549 Dorsalgia, unspecified: Secondary | ICD-10-CM | POA: Diagnosis not present

## 2018-01-19 DIAGNOSIS — G5600 Carpal tunnel syndrome, unspecified upper limb: Secondary | ICD-10-CM | POA: Diagnosis not present

## 2018-01-19 DIAGNOSIS — L97519 Non-pressure chronic ulcer of other part of right foot with unspecified severity: Secondary | ICD-10-CM | POA: Diagnosis present

## 2018-01-19 DIAGNOSIS — T8789 Other complications of amputation stump: Secondary | ICD-10-CM | POA: Diagnosis not present

## 2018-01-19 DIAGNOSIS — A4189 Other specified sepsis: Secondary | ICD-10-CM | POA: Diagnosis not present

## 2018-01-19 DIAGNOSIS — I739 Peripheral vascular disease, unspecified: Secondary | ICD-10-CM | POA: Diagnosis not present

## 2018-01-19 DIAGNOSIS — R6520 Severe sepsis without septic shock: Secondary | ICD-10-CM | POA: Diagnosis not present

## 2018-01-19 DIAGNOSIS — I272 Pulmonary hypertension, unspecified: Secondary | ICD-10-CM | POA: Diagnosis not present

## 2018-01-19 DIAGNOSIS — Z89429 Acquired absence of other toe(s), unspecified side: Secondary | ICD-10-CM | POA: Diagnosis not present

## 2018-01-19 DIAGNOSIS — Z951 Presence of aortocoronary bypass graft: Secondary | ICD-10-CM | POA: Diagnosis not present

## 2018-01-19 DIAGNOSIS — M47816 Spondylosis without myelopathy or radiculopathy, lumbar region: Secondary | ICD-10-CM | POA: Diagnosis not present

## 2018-01-19 DIAGNOSIS — E875 Hyperkalemia: Secondary | ICD-10-CM | POA: Diagnosis not present

## 2018-01-19 DIAGNOSIS — G4733 Obstructive sleep apnea (adult) (pediatric): Secondary | ICD-10-CM | POA: Diagnosis present

## 2018-01-19 DIAGNOSIS — I081 Rheumatic disorders of both mitral and tricuspid valves: Secondary | ICD-10-CM | POA: Diagnosis not present

## 2018-01-19 DIAGNOSIS — I255 Ischemic cardiomyopathy: Secondary | ICD-10-CM | POA: Diagnosis not present

## 2018-01-19 DIAGNOSIS — R278 Other lack of coordination: Secondary | ICD-10-CM | POA: Diagnosis not present

## 2018-01-19 DIAGNOSIS — W1839XA Other fall on same level, initial encounter: Secondary | ICD-10-CM | POA: Diagnosis not present

## 2018-01-19 DIAGNOSIS — R55 Syncope and collapse: Secondary | ICD-10-CM | POA: Diagnosis not present

## 2018-01-19 DIAGNOSIS — N289 Disorder of kidney and ureter, unspecified: Secondary | ICD-10-CM | POA: Diagnosis not present

## 2018-01-19 DIAGNOSIS — E1143 Type 2 diabetes mellitus with diabetic autonomic (poly)neuropathy: Secondary | ICD-10-CM | POA: Diagnosis present

## 2018-01-19 DIAGNOSIS — E1121 Type 2 diabetes mellitus with diabetic nephropathy: Secondary | ICD-10-CM | POA: Diagnosis not present

## 2018-01-19 DIAGNOSIS — I429 Cardiomyopathy, unspecified: Secondary | ICD-10-CM | POA: Diagnosis not present

## 2018-01-29 DIAGNOSIS — J810 Acute pulmonary edema: Secondary | ICD-10-CM | POA: Diagnosis not present

## 2018-01-29 DIAGNOSIS — E875 Hyperkalemia: Secondary | ICD-10-CM | POA: Diagnosis not present

## 2018-01-29 DIAGNOSIS — M869 Osteomyelitis, unspecified: Secondary | ICD-10-CM | POA: Diagnosis not present

## 2018-01-29 DIAGNOSIS — I700 Atherosclerosis of aorta: Secondary | ICD-10-CM | POA: Diagnosis present

## 2018-01-29 DIAGNOSIS — M5489 Other dorsalgia: Secondary | ICD-10-CM | POA: Diagnosis not present

## 2018-01-29 DIAGNOSIS — E1169 Type 2 diabetes mellitus with other specified complication: Secondary | ICD-10-CM | POA: Diagnosis not present

## 2018-01-29 DIAGNOSIS — N170 Acute kidney failure with tubular necrosis: Secondary | ICD-10-CM | POA: Diagnosis not present

## 2018-01-29 DIAGNOSIS — I2510 Atherosclerotic heart disease of native coronary artery without angina pectoris: Secondary | ICD-10-CM | POA: Diagnosis present

## 2018-01-29 DIAGNOSIS — R52 Pain, unspecified: Secondary | ICD-10-CM | POA: Diagnosis not present

## 2018-01-29 DIAGNOSIS — M545 Low back pain: Secondary | ICD-10-CM | POA: Diagnosis not present

## 2018-01-29 DIAGNOSIS — R269 Unspecified abnormalities of gait and mobility: Secondary | ICD-10-CM | POA: Diagnosis not present

## 2018-01-29 DIAGNOSIS — Z89422 Acquired absence of other left toe(s): Secondary | ICD-10-CM | POA: Diagnosis not present

## 2018-01-29 DIAGNOSIS — Z6841 Body Mass Index (BMI) 40.0 and over, adult: Secondary | ICD-10-CM | POA: Diagnosis not present

## 2018-01-29 DIAGNOSIS — R278 Other lack of coordination: Secondary | ICD-10-CM | POA: Diagnosis not present

## 2018-01-29 DIAGNOSIS — Z96641 Presence of right artificial hip joint: Secondary | ICD-10-CM | POA: Diagnosis not present

## 2018-01-29 DIAGNOSIS — T8789 Other complications of amputation stump: Secondary | ICD-10-CM | POA: Diagnosis not present

## 2018-01-29 DIAGNOSIS — S3210XD Unspecified fracture of sacrum, subsequent encounter for fracture with routine healing: Secondary | ICD-10-CM | POA: Diagnosis not present

## 2018-01-29 DIAGNOSIS — Z7982 Long term (current) use of aspirin: Secondary | ICD-10-CM | POA: Diagnosis not present

## 2018-01-29 DIAGNOSIS — G894 Chronic pain syndrome: Secondary | ICD-10-CM | POA: Diagnosis not present

## 2018-01-29 DIAGNOSIS — R262 Difficulty in walking, not elsewhere classified: Secondary | ICD-10-CM | POA: Diagnosis not present

## 2018-01-29 DIAGNOSIS — F458 Other somatoform disorders: Secondary | ICD-10-CM | POA: Diagnosis not present

## 2018-01-29 DIAGNOSIS — G629 Polyneuropathy, unspecified: Secondary | ICD-10-CM | POA: Diagnosis not present

## 2018-01-29 DIAGNOSIS — N179 Acute kidney failure, unspecified: Secondary | ICD-10-CM | POA: Diagnosis not present

## 2018-01-29 DIAGNOSIS — Z89431 Acquired absence of right foot: Secondary | ICD-10-CM | POA: Diagnosis not present

## 2018-01-29 DIAGNOSIS — I5021 Acute systolic (congestive) heart failure: Secondary | ICD-10-CM | POA: Diagnosis not present

## 2018-01-29 DIAGNOSIS — E1143 Type 2 diabetes mellitus with diabetic autonomic (poly)neuropathy: Secondary | ICD-10-CM | POA: Diagnosis not present

## 2018-01-29 DIAGNOSIS — Z951 Presence of aortocoronary bypass graft: Secondary | ICD-10-CM | POA: Diagnosis not present

## 2018-01-29 DIAGNOSIS — D638 Anemia in other chronic diseases classified elsewhere: Secondary | ICD-10-CM | POA: Diagnosis not present

## 2018-01-29 DIAGNOSIS — R41841 Cognitive communication deficit: Secondary | ICD-10-CM | POA: Diagnosis not present

## 2018-01-29 DIAGNOSIS — M86171 Other acute osteomyelitis, right ankle and foot: Secondary | ICD-10-CM | POA: Diagnosis not present

## 2018-01-29 DIAGNOSIS — E1122 Type 2 diabetes mellitus with diabetic chronic kidney disease: Secondary | ICD-10-CM | POA: Diagnosis present

## 2018-01-29 DIAGNOSIS — A4901 Methicillin susceptible Staphylococcus aureus infection, unspecified site: Secondary | ICD-10-CM | POA: Diagnosis not present

## 2018-01-29 DIAGNOSIS — R7881 Bacteremia: Secondary | ICD-10-CM | POA: Diagnosis not present

## 2018-01-29 DIAGNOSIS — D72829 Elevated white blood cell count, unspecified: Secondary | ICD-10-CM | POA: Diagnosis not present

## 2018-01-29 DIAGNOSIS — M48061 Spinal stenosis, lumbar region without neurogenic claudication: Secondary | ICD-10-CM | POA: Diagnosis present

## 2018-01-29 DIAGNOSIS — Z4502 Encounter for adjustment and management of automatic implantable cardiac defibrillator: Secondary | ICD-10-CM | POA: Diagnosis not present

## 2018-01-29 DIAGNOSIS — I255 Ischemic cardiomyopathy: Secondary | ICD-10-CM | POA: Diagnosis present

## 2018-01-29 DIAGNOSIS — G4730 Sleep apnea, unspecified: Secondary | ICD-10-CM | POA: Diagnosis present

## 2018-01-29 DIAGNOSIS — R112 Nausea with vomiting, unspecified: Secondary | ICD-10-CM | POA: Diagnosis not present

## 2018-01-29 DIAGNOSIS — D72828 Other elevated white blood cell count: Secondary | ICD-10-CM | POA: Diagnosis not present

## 2018-01-29 DIAGNOSIS — M109 Gout, unspecified: Secondary | ICD-10-CM | POA: Diagnosis not present

## 2018-01-29 DIAGNOSIS — Z89421 Acquired absence of other right toe(s): Secondary | ICD-10-CM | POA: Diagnosis not present

## 2018-01-29 DIAGNOSIS — E11621 Type 2 diabetes mellitus with foot ulcer: Secondary | ICD-10-CM | POA: Diagnosis not present

## 2018-01-29 DIAGNOSIS — Z89411 Acquired absence of right great toe: Secondary | ICD-10-CM | POA: Diagnosis not present

## 2018-01-29 DIAGNOSIS — I110 Hypertensive heart disease with heart failure: Secondary | ICD-10-CM | POA: Diagnosis not present

## 2018-01-29 DIAGNOSIS — R0602 Shortness of breath: Secondary | ICD-10-CM | POA: Diagnosis not present

## 2018-01-29 DIAGNOSIS — D631 Anemia in chronic kidney disease: Secondary | ICD-10-CM | POA: Diagnosis present

## 2018-01-29 DIAGNOSIS — Z95810 Presence of automatic (implantable) cardiac defibrillator: Secondary | ICD-10-CM | POA: Diagnosis not present

## 2018-01-29 DIAGNOSIS — I5022 Chronic systolic (congestive) heart failure: Secondary | ICD-10-CM | POA: Diagnosis present

## 2018-01-29 DIAGNOSIS — E559 Vitamin D deficiency, unspecified: Secondary | ICD-10-CM | POA: Diagnosis not present

## 2018-01-29 DIAGNOSIS — E1142 Type 2 diabetes mellitus with diabetic polyneuropathy: Secondary | ICD-10-CM | POA: Diagnosis not present

## 2018-01-29 DIAGNOSIS — G5623 Lesion of ulnar nerve, bilateral upper limbs: Secondary | ICD-10-CM | POA: Diagnosis not present

## 2018-01-29 DIAGNOSIS — R109 Unspecified abdominal pain: Secondary | ICD-10-CM | POA: Diagnosis not present

## 2018-01-29 DIAGNOSIS — N184 Chronic kidney disease, stage 4 (severe): Secondary | ICD-10-CM | POA: Diagnosis present

## 2018-01-29 DIAGNOSIS — Z7901 Long term (current) use of anticoagulants: Secondary | ICD-10-CM | POA: Diagnosis not present

## 2018-01-29 DIAGNOSIS — G4733 Obstructive sleep apnea (adult) (pediatric): Secondary | ICD-10-CM | POA: Diagnosis not present

## 2018-01-29 DIAGNOSIS — N183 Chronic kidney disease, stage 3 (moderate): Secondary | ICD-10-CM | POA: Diagnosis not present

## 2018-01-29 DIAGNOSIS — R55 Syncope and collapse: Secondary | ICD-10-CM | POA: Diagnosis not present

## 2018-01-29 DIAGNOSIS — I130 Hypertensive heart and chronic kidney disease with heart failure and stage 1 through stage 4 chronic kidney disease, or unspecified chronic kidney disease: Secondary | ICD-10-CM | POA: Diagnosis present

## 2018-01-29 DIAGNOSIS — E785 Hyperlipidemia, unspecified: Secondary | ICD-10-CM | POA: Diagnosis not present

## 2018-01-29 DIAGNOSIS — Z794 Long term (current) use of insulin: Secondary | ICD-10-CM | POA: Diagnosis not present

## 2018-01-29 DIAGNOSIS — N049 Nephrotic syndrome with unspecified morphologic changes: Secondary | ICD-10-CM | POA: Diagnosis not present

## 2018-01-29 DIAGNOSIS — D508 Other iron deficiency anemias: Secondary | ICD-10-CM | POA: Diagnosis not present

## 2018-01-29 DIAGNOSIS — I739 Peripheral vascular disease, unspecified: Secondary | ICD-10-CM | POA: Diagnosis not present

## 2018-01-29 DIAGNOSIS — T148XXD Other injury of unspecified body region, subsequent encounter: Secondary | ICD-10-CM | POA: Diagnosis not present

## 2018-01-29 DIAGNOSIS — G5603 Carpal tunnel syndrome, bilateral upper limbs: Secondary | ICD-10-CM | POA: Diagnosis not present

## 2018-01-29 DIAGNOSIS — M868X7 Other osteomyelitis, ankle and foot: Secondary | ICD-10-CM | POA: Diagnosis not present

## 2018-01-29 DIAGNOSIS — I4891 Unspecified atrial fibrillation: Secondary | ICD-10-CM | POA: Diagnosis present

## 2018-01-29 DIAGNOSIS — S3210XA Unspecified fracture of sacrum, initial encounter for closed fracture: Secondary | ICD-10-CM | POA: Diagnosis not present

## 2018-01-29 DIAGNOSIS — D649 Anemia, unspecified: Secondary | ICD-10-CM | POA: Diagnosis not present

## 2018-01-29 DIAGNOSIS — B9561 Methicillin susceptible Staphylococcus aureus infection as the cause of diseases classified elsewhere: Secondary | ICD-10-CM | POA: Diagnosis present

## 2018-01-29 DIAGNOSIS — I429 Cardiomyopathy, unspecified: Secondary | ICD-10-CM | POA: Diagnosis not present

## 2018-01-29 DIAGNOSIS — A4101 Sepsis due to Methicillin susceptible Staphylococcus aureus: Secondary | ICD-10-CM | POA: Diagnosis not present

## 2018-01-29 DIAGNOSIS — I10 Essential (primary) hypertension: Secondary | ICD-10-CM | POA: Diagnosis not present

## 2018-01-29 DIAGNOSIS — R110 Nausea: Secondary | ICD-10-CM | POA: Diagnosis not present

## 2018-01-29 DIAGNOSIS — R609 Edema, unspecified: Secondary | ICD-10-CM | POA: Diagnosis not present

## 2018-01-29 DIAGNOSIS — L97519 Non-pressure chronic ulcer of other part of right foot with unspecified severity: Secondary | ICD-10-CM | POA: Diagnosis not present

## 2018-01-29 DIAGNOSIS — I951 Orthostatic hypotension: Secondary | ICD-10-CM | POA: Diagnosis not present

## 2018-01-29 DIAGNOSIS — Z7952 Long term (current) use of systemic steroids: Secondary | ICD-10-CM | POA: Diagnosis not present

## 2018-01-29 DIAGNOSIS — I129 Hypertensive chronic kidney disease with stage 1 through stage 4 chronic kidney disease, or unspecified chronic kidney disease: Secondary | ICD-10-CM | POA: Diagnosis not present

## 2018-01-29 DIAGNOSIS — M6281 Muscle weakness (generalized): Secondary | ICD-10-CM | POA: Diagnosis not present

## 2018-01-31 DIAGNOSIS — I5022 Chronic systolic (congestive) heart failure: Secondary | ICD-10-CM | POA: Diagnosis not present

## 2018-01-31 DIAGNOSIS — D649 Anemia, unspecified: Secondary | ICD-10-CM | POA: Diagnosis not present

## 2018-01-31 DIAGNOSIS — I255 Ischemic cardiomyopathy: Secondary | ICD-10-CM | POA: Diagnosis not present

## 2018-01-31 DIAGNOSIS — I10 Essential (primary) hypertension: Secondary | ICD-10-CM | POA: Diagnosis not present

## 2018-01-31 DIAGNOSIS — Z4502 Encounter for adjustment and management of automatic implantable cardiac defibrillator: Secondary | ICD-10-CM | POA: Diagnosis not present

## 2018-01-31 DIAGNOSIS — I429 Cardiomyopathy, unspecified: Secondary | ICD-10-CM | POA: Diagnosis not present

## 2018-01-31 DIAGNOSIS — Z95810 Presence of automatic (implantable) cardiac defibrillator: Secondary | ICD-10-CM | POA: Diagnosis not present

## 2018-01-31 DIAGNOSIS — E1122 Type 2 diabetes mellitus with diabetic chronic kidney disease: Secondary | ICD-10-CM | POA: Diagnosis not present

## 2018-02-01 DIAGNOSIS — A4901 Methicillin susceptible Staphylococcus aureus infection, unspecified site: Secondary | ICD-10-CM | POA: Diagnosis not present

## 2018-02-01 DIAGNOSIS — G5623 Lesion of ulnar nerve, bilateral upper limbs: Secondary | ICD-10-CM | POA: Diagnosis not present

## 2018-02-01 DIAGNOSIS — G894 Chronic pain syndrome: Secondary | ICD-10-CM | POA: Diagnosis not present

## 2018-02-01 DIAGNOSIS — I5022 Chronic systolic (congestive) heart failure: Secondary | ICD-10-CM | POA: Diagnosis not present

## 2018-02-03 DIAGNOSIS — A4901 Methicillin susceptible Staphylococcus aureus infection, unspecified site: Secondary | ICD-10-CM | POA: Diagnosis not present

## 2018-02-03 DIAGNOSIS — D72828 Other elevated white blood cell count: Secondary | ICD-10-CM | POA: Diagnosis not present

## 2018-02-04 DIAGNOSIS — G629 Polyneuropathy, unspecified: Secondary | ICD-10-CM | POA: Diagnosis not present

## 2018-02-04 DIAGNOSIS — G894 Chronic pain syndrome: Secondary | ICD-10-CM | POA: Diagnosis not present

## 2018-02-08 DIAGNOSIS — D72829 Elevated white blood cell count, unspecified: Secondary | ICD-10-CM | POA: Diagnosis not present

## 2018-02-08 DIAGNOSIS — R7881 Bacteremia: Secondary | ICD-10-CM | POA: Diagnosis not present

## 2018-02-08 DIAGNOSIS — N183 Chronic kidney disease, stage 3 (moderate): Secondary | ICD-10-CM | POA: Diagnosis not present

## 2018-02-08 DIAGNOSIS — M869 Osteomyelitis, unspecified: Secondary | ICD-10-CM | POA: Diagnosis not present

## 2018-02-09 DIAGNOSIS — E1122 Type 2 diabetes mellitus with diabetic chronic kidney disease: Secondary | ICD-10-CM | POA: Diagnosis not present

## 2018-02-09 DIAGNOSIS — I10 Essential (primary) hypertension: Secondary | ICD-10-CM | POA: Diagnosis not present

## 2018-02-09 DIAGNOSIS — D649 Anemia, unspecified: Secondary | ICD-10-CM | POA: Diagnosis not present

## 2018-02-09 DIAGNOSIS — E559 Vitamin D deficiency, unspecified: Secondary | ICD-10-CM | POA: Diagnosis not present

## 2018-02-10 DIAGNOSIS — A4901 Methicillin susceptible Staphylococcus aureus infection, unspecified site: Secondary | ICD-10-CM | POA: Diagnosis not present

## 2018-02-10 DIAGNOSIS — E1122 Type 2 diabetes mellitus with diabetic chronic kidney disease: Secondary | ICD-10-CM | POA: Diagnosis not present

## 2018-02-10 DIAGNOSIS — N183 Chronic kidney disease, stage 3 (moderate): Secondary | ICD-10-CM | POA: Diagnosis not present

## 2018-02-10 DIAGNOSIS — I5022 Chronic systolic (congestive) heart failure: Secondary | ICD-10-CM | POA: Diagnosis not present

## 2018-02-14 DIAGNOSIS — G894 Chronic pain syndrome: Secondary | ICD-10-CM | POA: Diagnosis not present

## 2018-02-14 DIAGNOSIS — A4901 Methicillin susceptible Staphylococcus aureus infection, unspecified site: Secondary | ICD-10-CM | POA: Diagnosis not present

## 2018-02-14 DIAGNOSIS — G5623 Lesion of ulnar nerve, bilateral upper limbs: Secondary | ICD-10-CM | POA: Diagnosis not present

## 2018-02-14 DIAGNOSIS — I5022 Chronic systolic (congestive) heart failure: Secondary | ICD-10-CM | POA: Diagnosis not present

## 2018-02-16 DIAGNOSIS — N179 Acute kidney failure, unspecified: Secondary | ICD-10-CM | POA: Diagnosis not present

## 2018-02-16 DIAGNOSIS — N183 Chronic kidney disease, stage 3 (moderate): Secondary | ICD-10-CM | POA: Diagnosis not present

## 2018-02-16 DIAGNOSIS — I129 Hypertensive chronic kidney disease with stage 1 through stage 4 chronic kidney disease, or unspecified chronic kidney disease: Secondary | ICD-10-CM | POA: Diagnosis not present

## 2018-02-16 DIAGNOSIS — R7881 Bacteremia: Secondary | ICD-10-CM | POA: Diagnosis not present

## 2018-02-16 DIAGNOSIS — E1122 Type 2 diabetes mellitus with diabetic chronic kidney disease: Secondary | ICD-10-CM | POA: Diagnosis not present

## 2018-02-16 DIAGNOSIS — N049 Nephrotic syndrome with unspecified morphologic changes: Secondary | ICD-10-CM | POA: Diagnosis not present

## 2018-02-16 DIAGNOSIS — Z794 Long term (current) use of insulin: Secondary | ICD-10-CM | POA: Diagnosis not present

## 2018-02-18 DIAGNOSIS — R0602 Shortness of breath: Secondary | ICD-10-CM | POA: Diagnosis not present

## 2018-02-18 DIAGNOSIS — R609 Edema, unspecified: Secondary | ICD-10-CM | POA: Diagnosis not present

## 2018-02-18 DIAGNOSIS — N184 Chronic kidney disease, stage 4 (severe): Secondary | ICD-10-CM | POA: Diagnosis not present

## 2018-02-18 DIAGNOSIS — I5022 Chronic systolic (congestive) heart failure: Secondary | ICD-10-CM | POA: Diagnosis not present

## 2018-02-21 DIAGNOSIS — G894 Chronic pain syndrome: Secondary | ICD-10-CM | POA: Diagnosis not present

## 2018-02-21 DIAGNOSIS — I5022 Chronic systolic (congestive) heart failure: Secondary | ICD-10-CM | POA: Diagnosis not present

## 2018-02-21 DIAGNOSIS — G5623 Lesion of ulnar nerve, bilateral upper limbs: Secondary | ICD-10-CM | POA: Diagnosis not present

## 2018-02-21 DIAGNOSIS — A4901 Methicillin susceptible Staphylococcus aureus infection, unspecified site: Secondary | ICD-10-CM | POA: Diagnosis not present

## 2018-02-22 DIAGNOSIS — N179 Acute kidney failure, unspecified: Secondary | ICD-10-CM | POA: Diagnosis not present

## 2018-02-22 DIAGNOSIS — Z7901 Long term (current) use of anticoagulants: Secondary | ICD-10-CM | POA: Diagnosis not present

## 2018-02-22 DIAGNOSIS — E1143 Type 2 diabetes mellitus with diabetic autonomic (poly)neuropathy: Secondary | ICD-10-CM | POA: Diagnosis not present

## 2018-02-22 DIAGNOSIS — N183 Chronic kidney disease, stage 3 (moderate): Secondary | ICD-10-CM | POA: Diagnosis not present

## 2018-02-22 DIAGNOSIS — N184 Chronic kidney disease, stage 4 (severe): Secondary | ICD-10-CM | POA: Diagnosis not present

## 2018-02-22 DIAGNOSIS — I2510 Atherosclerotic heart disease of native coronary artery without angina pectoris: Secondary | ICD-10-CM | POA: Diagnosis not present

## 2018-02-22 DIAGNOSIS — I130 Hypertensive heart and chronic kidney disease with heart failure and stage 1 through stage 4 chronic kidney disease, or unspecified chronic kidney disease: Secondary | ICD-10-CM | POA: Diagnosis not present

## 2018-02-22 DIAGNOSIS — E11621 Type 2 diabetes mellitus with foot ulcer: Secondary | ICD-10-CM | POA: Diagnosis not present

## 2018-02-22 DIAGNOSIS — D508 Other iron deficiency anemias: Secondary | ICD-10-CM | POA: Diagnosis not present

## 2018-02-22 DIAGNOSIS — E1122 Type 2 diabetes mellitus with diabetic chronic kidney disease: Secondary | ICD-10-CM | POA: Diagnosis not present

## 2018-02-22 DIAGNOSIS — I5021 Acute systolic (congestive) heart failure: Secondary | ICD-10-CM | POA: Diagnosis not present

## 2018-02-22 DIAGNOSIS — I429 Cardiomyopathy, unspecified: Secondary | ICD-10-CM | POA: Diagnosis not present

## 2018-02-22 DIAGNOSIS — G4733 Obstructive sleep apnea (adult) (pediatric): Secondary | ICD-10-CM | POA: Diagnosis not present

## 2018-02-22 DIAGNOSIS — I5022 Chronic systolic (congestive) heart failure: Secondary | ICD-10-CM | POA: Diagnosis not present

## 2018-02-22 DIAGNOSIS — E1142 Type 2 diabetes mellitus with diabetic polyneuropathy: Secondary | ICD-10-CM | POA: Diagnosis not present

## 2018-02-23 DIAGNOSIS — I5021 Acute systolic (congestive) heart failure: Secondary | ICD-10-CM | POA: Diagnosis not present

## 2018-02-23 DIAGNOSIS — N184 Chronic kidney disease, stage 4 (severe): Secondary | ICD-10-CM | POA: Diagnosis not present

## 2018-02-23 DIAGNOSIS — D508 Other iron deficiency anemias: Secondary | ICD-10-CM | POA: Diagnosis not present

## 2018-02-23 DIAGNOSIS — E559 Vitamin D deficiency, unspecified: Secondary | ICD-10-CM | POA: Diagnosis not present

## 2018-02-24 DIAGNOSIS — I5022 Chronic systolic (congestive) heart failure: Secondary | ICD-10-CM | POA: Diagnosis not present

## 2018-02-24 DIAGNOSIS — R269 Unspecified abnormalities of gait and mobility: Secondary | ICD-10-CM | POA: Diagnosis not present

## 2018-02-28 DIAGNOSIS — A4901 Methicillin susceptible Staphylococcus aureus infection, unspecified site: Secondary | ICD-10-CM | POA: Diagnosis not present

## 2018-02-28 DIAGNOSIS — G5623 Lesion of ulnar nerve, bilateral upper limbs: Secondary | ICD-10-CM | POA: Diagnosis not present

## 2018-02-28 DIAGNOSIS — G894 Chronic pain syndrome: Secondary | ICD-10-CM | POA: Diagnosis not present

## 2018-02-28 DIAGNOSIS — M109 Gout, unspecified: Secondary | ICD-10-CM | POA: Diagnosis not present

## 2018-03-02 DIAGNOSIS — M545 Low back pain: Secondary | ICD-10-CM | POA: Diagnosis not present

## 2018-03-02 DIAGNOSIS — N179 Acute kidney failure, unspecified: Secondary | ICD-10-CM | POA: Diagnosis not present

## 2018-03-02 DIAGNOSIS — E1122 Type 2 diabetes mellitus with diabetic chronic kidney disease: Secondary | ICD-10-CM | POA: Diagnosis not present

## 2018-03-02 DIAGNOSIS — N183 Chronic kidney disease, stage 3 (moderate): Secondary | ICD-10-CM | POA: Diagnosis not present

## 2018-03-02 DIAGNOSIS — Z794 Long term (current) use of insulin: Secondary | ICD-10-CM | POA: Diagnosis not present

## 2018-03-02 DIAGNOSIS — N184 Chronic kidney disease, stage 4 (severe): Secondary | ICD-10-CM | POA: Diagnosis not present

## 2018-03-02 DIAGNOSIS — N049 Nephrotic syndrome with unspecified morphologic changes: Secondary | ICD-10-CM | POA: Diagnosis not present

## 2018-03-02 DIAGNOSIS — A4901 Methicillin susceptible Staphylococcus aureus infection, unspecified site: Secondary | ICD-10-CM | POA: Diagnosis not present

## 2018-03-02 DIAGNOSIS — I5022 Chronic systolic (congestive) heart failure: Secondary | ICD-10-CM | POA: Diagnosis not present

## 2018-03-02 DIAGNOSIS — I129 Hypertensive chronic kidney disease with stage 1 through stage 4 chronic kidney disease, or unspecified chronic kidney disease: Secondary | ICD-10-CM | POA: Diagnosis not present

## 2018-03-03 DIAGNOSIS — B9561 Methicillin susceptible Staphylococcus aureus infection as the cause of diseases classified elsewhere: Secondary | ICD-10-CM | POA: Diagnosis present

## 2018-03-03 DIAGNOSIS — M109 Gout, unspecified: Secondary | ICD-10-CM | POA: Diagnosis present

## 2018-03-03 DIAGNOSIS — M545 Low back pain: Secondary | ICD-10-CM | POA: Diagnosis not present

## 2018-03-03 DIAGNOSIS — I110 Hypertensive heart disease with heart failure: Secondary | ICD-10-CM | POA: Diagnosis not present

## 2018-03-03 DIAGNOSIS — R112 Nausea with vomiting, unspecified: Secondary | ICD-10-CM | POA: Diagnosis not present

## 2018-03-03 DIAGNOSIS — G5603 Carpal tunnel syndrome, bilateral upper limbs: Secondary | ICD-10-CM | POA: Diagnosis not present

## 2018-03-03 DIAGNOSIS — I509 Heart failure, unspecified: Secondary | ICD-10-CM | POA: Diagnosis not present

## 2018-03-03 DIAGNOSIS — R9431 Abnormal electrocardiogram [ECG] [EKG]: Secondary | ICD-10-CM | POA: Diagnosis not present

## 2018-03-03 DIAGNOSIS — Z89421 Acquired absence of other right toe(s): Secondary | ICD-10-CM | POA: Diagnosis not present

## 2018-03-03 DIAGNOSIS — R278 Other lack of coordination: Secondary | ICD-10-CM | POA: Diagnosis not present

## 2018-03-03 DIAGNOSIS — I129 Hypertensive chronic kidney disease with stage 1 through stage 4 chronic kidney disease, or unspecified chronic kidney disease: Secondary | ICD-10-CM | POA: Diagnosis not present

## 2018-03-03 DIAGNOSIS — Z7982 Long term (current) use of aspirin: Secondary | ICD-10-CM | POA: Diagnosis not present

## 2018-03-03 DIAGNOSIS — I429 Cardiomyopathy, unspecified: Secondary | ICD-10-CM | POA: Diagnosis not present

## 2018-03-03 DIAGNOSIS — E8770 Fluid overload, unspecified: Secondary | ICD-10-CM | POA: Diagnosis not present

## 2018-03-03 DIAGNOSIS — I5022 Chronic systolic (congestive) heart failure: Secondary | ICD-10-CM | POA: Diagnosis present

## 2018-03-03 DIAGNOSIS — N183 Chronic kidney disease, stage 3 (moderate): Secondary | ICD-10-CM | POA: Diagnosis not present

## 2018-03-03 DIAGNOSIS — I2510 Atherosclerotic heart disease of native coronary artery without angina pectoris: Secondary | ICD-10-CM | POA: Diagnosis present

## 2018-03-03 DIAGNOSIS — A4901 Methicillin susceptible Staphylococcus aureus infection, unspecified site: Secondary | ICD-10-CM | POA: Diagnosis not present

## 2018-03-03 DIAGNOSIS — E1143 Type 2 diabetes mellitus with diabetic autonomic (poly)neuropathy: Secondary | ICD-10-CM | POA: Diagnosis not present

## 2018-03-03 DIAGNOSIS — D631 Anemia in chronic kidney disease: Secondary | ICD-10-CM | POA: Diagnosis present

## 2018-03-03 DIAGNOSIS — A4101 Sepsis due to Methicillin susceptible Staphylococcus aureus: Secondary | ICD-10-CM | POA: Diagnosis not present

## 2018-03-03 DIAGNOSIS — E785 Hyperlipidemia, unspecified: Secondary | ICD-10-CM | POA: Diagnosis not present

## 2018-03-03 DIAGNOSIS — E119 Type 2 diabetes mellitus without complications: Secondary | ICD-10-CM | POA: Diagnosis not present

## 2018-03-03 DIAGNOSIS — Z951 Presence of aortocoronary bypass graft: Secondary | ICD-10-CM | POA: Diagnosis not present

## 2018-03-03 DIAGNOSIS — I700 Atherosclerosis of aorta: Secondary | ICD-10-CM | POA: Diagnosis present

## 2018-03-03 DIAGNOSIS — M48061 Spinal stenosis, lumbar region without neurogenic claudication: Secondary | ICD-10-CM | POA: Diagnosis present

## 2018-03-03 DIAGNOSIS — I739 Peripheral vascular disease, unspecified: Secondary | ICD-10-CM | POA: Diagnosis not present

## 2018-03-03 DIAGNOSIS — M6281 Muscle weakness (generalized): Secondary | ICD-10-CM | POA: Diagnosis not present

## 2018-03-03 DIAGNOSIS — R7881 Bacteremia: Secondary | ICD-10-CM | POA: Diagnosis not present

## 2018-03-03 DIAGNOSIS — S3210XD Unspecified fracture of sacrum, subsequent encounter for fracture with routine healing: Secondary | ICD-10-CM | POA: Diagnosis not present

## 2018-03-03 DIAGNOSIS — M86171 Other acute osteomyelitis, right ankle and foot: Secondary | ICD-10-CM | POA: Diagnosis not present

## 2018-03-03 DIAGNOSIS — R109 Unspecified abdominal pain: Secondary | ICD-10-CM | POA: Diagnosis not present

## 2018-03-03 DIAGNOSIS — E1121 Type 2 diabetes mellitus with diabetic nephropathy: Secondary | ICD-10-CM | POA: Diagnosis not present

## 2018-03-03 DIAGNOSIS — N184 Chronic kidney disease, stage 4 (severe): Secondary | ICD-10-CM | POA: Diagnosis present

## 2018-03-03 DIAGNOSIS — Z794 Long term (current) use of insulin: Secondary | ICD-10-CM | POA: Diagnosis not present

## 2018-03-03 DIAGNOSIS — Z6841 Body Mass Index (BMI) 40.0 and over, adult: Secondary | ICD-10-CM | POA: Diagnosis not present

## 2018-03-03 DIAGNOSIS — R52 Pain, unspecified: Secondary | ICD-10-CM | POA: Diagnosis not present

## 2018-03-03 DIAGNOSIS — R110 Nausea: Secondary | ICD-10-CM | POA: Diagnosis not present

## 2018-03-03 DIAGNOSIS — G894 Chronic pain syndrome: Secondary | ICD-10-CM | POA: Diagnosis not present

## 2018-03-03 DIAGNOSIS — I255 Ischemic cardiomyopathy: Secondary | ICD-10-CM | POA: Diagnosis present

## 2018-03-03 DIAGNOSIS — N049 Nephrotic syndrome with unspecified morphologic changes: Secondary | ICD-10-CM | POA: Diagnosis not present

## 2018-03-03 DIAGNOSIS — R262 Difficulty in walking, not elsewhere classified: Secondary | ICD-10-CM | POA: Diagnosis not present

## 2018-03-03 DIAGNOSIS — N170 Acute kidney failure with tubular necrosis: Secondary | ICD-10-CM | POA: Diagnosis not present

## 2018-03-03 DIAGNOSIS — I130 Hypertensive heart and chronic kidney disease with heart failure and stage 1 through stage 4 chronic kidney disease, or unspecified chronic kidney disease: Secondary | ICD-10-CM | POA: Diagnosis present

## 2018-03-03 DIAGNOSIS — J810 Acute pulmonary edema: Secondary | ICD-10-CM | POA: Diagnosis not present

## 2018-03-03 DIAGNOSIS — E1169 Type 2 diabetes mellitus with other specified complication: Secondary | ICD-10-CM | POA: Diagnosis present

## 2018-03-03 DIAGNOSIS — E1122 Type 2 diabetes mellitus with diabetic chronic kidney disease: Secondary | ICD-10-CM | POA: Diagnosis present

## 2018-03-03 DIAGNOSIS — Z95810 Presence of automatic (implantable) cardiac defibrillator: Secondary | ICD-10-CM | POA: Diagnosis not present

## 2018-03-03 DIAGNOSIS — N179 Acute kidney failure, unspecified: Secondary | ICD-10-CM | POA: Diagnosis present

## 2018-03-03 DIAGNOSIS — I951 Orthostatic hypotension: Secondary | ICD-10-CM | POA: Diagnosis not present

## 2018-03-03 DIAGNOSIS — D638 Anemia in other chronic diseases classified elsewhere: Secondary | ICD-10-CM | POA: Diagnosis not present

## 2018-03-03 DIAGNOSIS — R41841 Cognitive communication deficit: Secondary | ICD-10-CM | POA: Diagnosis not present

## 2018-03-03 DIAGNOSIS — I5023 Acute on chronic systolic (congestive) heart failure: Secondary | ICD-10-CM | POA: Diagnosis not present

## 2018-03-03 DIAGNOSIS — Z89431 Acquired absence of right foot: Secondary | ICD-10-CM | POA: Diagnosis not present

## 2018-03-03 DIAGNOSIS — G4730 Sleep apnea, unspecified: Secondary | ICD-10-CM | POA: Diagnosis present

## 2018-03-03 DIAGNOSIS — M5489 Other dorsalgia: Secondary | ICD-10-CM | POA: Diagnosis not present

## 2018-03-03 DIAGNOSIS — Z7952 Long term (current) use of systemic steroids: Secondary | ICD-10-CM | POA: Diagnosis not present

## 2018-03-03 DIAGNOSIS — I4891 Unspecified atrial fibrillation: Secondary | ICD-10-CM | POA: Diagnosis present
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
## 2018-03-08 DIAGNOSIS — E875 Hyperkalemia: Secondary | ICD-10-CM | POA: Diagnosis present

## 2018-03-08 DIAGNOSIS — Z794 Long term (current) use of insulin: Secondary | ICD-10-CM | POA: Diagnosis not present

## 2018-03-08 DIAGNOSIS — Z7982 Long term (current) use of aspirin: Secondary | ICD-10-CM | POA: Diagnosis not present

## 2018-03-08 DIAGNOSIS — D509 Iron deficiency anemia, unspecified: Secondary | ICD-10-CM | POA: Diagnosis present

## 2018-03-08 DIAGNOSIS — I429 Cardiomyopathy, unspecified: Secondary | ICD-10-CM | POA: Diagnosis not present

## 2018-03-08 DIAGNOSIS — I739 Peripheral vascular disease, unspecified: Secondary | ICD-10-CM | POA: Diagnosis not present

## 2018-03-08 DIAGNOSIS — I129 Hypertensive chronic kidney disease with stage 1 through stage 4 chronic kidney disease, or unspecified chronic kidney disease: Secondary | ICD-10-CM | POA: Diagnosis not present

## 2018-03-08 DIAGNOSIS — E1143 Type 2 diabetes mellitus with diabetic autonomic (poly)neuropathy: Secondary | ICD-10-CM | POA: Diagnosis not present

## 2018-03-08 DIAGNOSIS — I10 Essential (primary) hypertension: Secondary | ICD-10-CM | POA: Diagnosis not present

## 2018-03-08 DIAGNOSIS — R41841 Cognitive communication deficit: Secondary | ICD-10-CM | POA: Diagnosis not present

## 2018-03-08 DIAGNOSIS — E1169 Type 2 diabetes mellitus with other specified complication: Secondary | ICD-10-CM | POA: Diagnosis present

## 2018-03-08 DIAGNOSIS — D649 Anemia, unspecified: Secondary | ICD-10-CM | POA: Diagnosis not present

## 2018-03-08 DIAGNOSIS — Z6838 Body mass index (BMI) 38.0-38.9, adult: Secondary | ICD-10-CM | POA: Diagnosis not present

## 2018-03-08 DIAGNOSIS — I2510 Atherosclerotic heart disease of native coronary artery without angina pectoris: Secondary | ICD-10-CM | POA: Diagnosis present

## 2018-03-08 DIAGNOSIS — E8770 Fluid overload, unspecified: Secondary | ICD-10-CM | POA: Diagnosis not present

## 2018-03-08 DIAGNOSIS — A4101 Sepsis due to Methicillin susceptible Staphylococcus aureus: Secondary | ICD-10-CM | POA: Diagnosis not present

## 2018-03-08 DIAGNOSIS — D631 Anemia in chronic kidney disease: Secondary | ICD-10-CM | POA: Diagnosis present

## 2018-03-08 DIAGNOSIS — Z89421 Acquired absence of other right toe(s): Secondary | ICD-10-CM | POA: Diagnosis not present

## 2018-03-08 DIAGNOSIS — N179 Acute kidney failure, unspecified: Secondary | ICD-10-CM | POA: Diagnosis not present

## 2018-03-08 DIAGNOSIS — M109 Gout, unspecified: Secondary | ICD-10-CM | POA: Diagnosis not present

## 2018-03-08 DIAGNOSIS — A4901 Methicillin susceptible Staphylococcus aureus infection, unspecified site: Secondary | ICD-10-CM | POA: Diagnosis not present

## 2018-03-08 DIAGNOSIS — R262 Difficulty in walking, not elsewhere classified: Secondary | ICD-10-CM | POA: Diagnosis not present

## 2018-03-08 DIAGNOSIS — N184 Chronic kidney disease, stage 4 (severe): Secondary | ICD-10-CM | POA: Diagnosis not present

## 2018-03-08 DIAGNOSIS — I130 Hypertensive heart and chronic kidney disease with heart failure and stage 1 through stage 4 chronic kidney disease, or unspecified chronic kidney disease: Secondary | ICD-10-CM | POA: Diagnosis present

## 2018-03-08 DIAGNOSIS — E785 Hyperlipidemia, unspecified: Secondary | ICD-10-CM | POA: Diagnosis not present

## 2018-03-08 DIAGNOSIS — R112 Nausea with vomiting, unspecified: Secondary | ICD-10-CM | POA: Diagnosis not present

## 2018-03-08 DIAGNOSIS — R5381 Other malaise: Secondary | ICD-10-CM | POA: Diagnosis not present

## 2018-03-08 DIAGNOSIS — I951 Orthostatic hypotension: Secondary | ICD-10-CM | POA: Diagnosis not present

## 2018-03-08 DIAGNOSIS — M6281 Muscle weakness (generalized): Secondary | ICD-10-CM | POA: Diagnosis not present

## 2018-03-08 DIAGNOSIS — N170 Acute kidney failure with tubular necrosis: Secondary | ICD-10-CM | POA: Diagnosis not present

## 2018-03-08 DIAGNOSIS — E1122 Type 2 diabetes mellitus with diabetic chronic kidney disease: Secondary | ICD-10-CM | POA: Diagnosis present

## 2018-03-08 DIAGNOSIS — S3210XD Unspecified fracture of sacrum, subsequent encounter for fracture with routine healing: Secondary | ICD-10-CM | POA: Diagnosis not present

## 2018-03-08 DIAGNOSIS — E1151 Type 2 diabetes mellitus with diabetic peripheral angiopathy without gangrene: Secondary | ICD-10-CM | POA: Diagnosis present

## 2018-03-08 DIAGNOSIS — R278 Other lack of coordination: Secondary | ICD-10-CM | POA: Diagnosis not present

## 2018-03-08 DIAGNOSIS — Z951 Presence of aortocoronary bypass graft: Secondary | ICD-10-CM | POA: Diagnosis not present

## 2018-03-08 DIAGNOSIS — M86171 Other acute osteomyelitis, right ankle and foot: Secondary | ICD-10-CM | POA: Diagnosis not present

## 2018-03-08 DIAGNOSIS — I4892 Unspecified atrial flutter: Secondary | ICD-10-CM | POA: Diagnosis present

## 2018-03-08 DIAGNOSIS — I5022 Chronic systolic (congestive) heart failure: Secondary | ICD-10-CM | POA: Diagnosis present

## 2018-03-08 DIAGNOSIS — N183 Chronic kidney disease, stage 3 (moderate): Secondary | ICD-10-CM | POA: Diagnosis not present

## 2018-03-08 DIAGNOSIS — G894 Chronic pain syndrome: Secondary | ICD-10-CM | POA: Diagnosis not present

## 2018-03-08 DIAGNOSIS — G5603 Carpal tunnel syndrome, bilateral upper limbs: Secondary | ICD-10-CM | POA: Diagnosis not present

## 2018-03-08 DIAGNOSIS — Z95810 Presence of automatic (implantable) cardiac defibrillator: Secondary | ICD-10-CM | POA: Diagnosis not present

## 2018-03-08 DIAGNOSIS — R7881 Bacteremia: Secondary | ICD-10-CM | POA: Diagnosis not present

## 2018-03-08 DIAGNOSIS — E669 Obesity, unspecified: Secondary | ICD-10-CM | POA: Diagnosis present

## 2018-03-08 DIAGNOSIS — D638 Anemia in other chronic diseases classified elsewhere: Secondary | ICD-10-CM | POA: Diagnosis not present

## 2018-03-08 DIAGNOSIS — G629 Polyneuropathy, unspecified: Secondary | ICD-10-CM | POA: Diagnosis not present

## 2018-03-08 DIAGNOSIS — I509 Heart failure, unspecified: Secondary | ICD-10-CM | POA: Diagnosis not present

## 2018-03-11 DIAGNOSIS — D649 Anemia, unspecified: Secondary | ICD-10-CM | POA: Diagnosis not present

## 2018-03-11 DIAGNOSIS — G629 Polyneuropathy, unspecified: Secondary | ICD-10-CM | POA: Diagnosis not present

## 2018-03-11 DIAGNOSIS — I5022 Chronic systolic (congestive) heart failure: Secondary | ICD-10-CM | POA: Diagnosis not present

## 2018-03-11 DIAGNOSIS — N184 Chronic kidney disease, stage 4 (severe): Secondary | ICD-10-CM | POA: Diagnosis not present

## 2018-03-12 DIAGNOSIS — D649 Anemia, unspecified: Secondary | ICD-10-CM | POA: Diagnosis not present

## 2018-03-12 DIAGNOSIS — M109 Gout, unspecified: Secondary | ICD-10-CM | POA: Diagnosis not present

## 2018-03-12 DIAGNOSIS — E1122 Type 2 diabetes mellitus with diabetic chronic kidney disease: Secondary | ICD-10-CM | POA: Diagnosis not present

## 2018-03-12 DIAGNOSIS — I10 Essential (primary) hypertension: Secondary | ICD-10-CM | POA: Diagnosis not present

## 2018-03-16 DIAGNOSIS — I129 Hypertensive chronic kidney disease with stage 1 through stage 4 chronic kidney disease, or unspecified chronic kidney disease: Secondary | ICD-10-CM | POA: Diagnosis not present

## 2018-03-16 DIAGNOSIS — I130 Hypertensive heart and chronic kidney disease with heart failure and stage 1 through stage 4 chronic kidney disease, or unspecified chronic kidney disease: Secondary | ICD-10-CM | POA: Diagnosis not present

## 2018-03-16 DIAGNOSIS — N183 Chronic kidney disease, stage 3 (moderate): Secondary | ICD-10-CM | POA: Diagnosis not present

## 2018-03-16 DIAGNOSIS — I2510 Atherosclerotic heart disease of native coronary artery without angina pectoris: Secondary | ICD-10-CM | POA: Diagnosis not present

## 2018-03-16 DIAGNOSIS — I4892 Unspecified atrial flutter: Secondary | ICD-10-CM | POA: Diagnosis not present

## 2018-03-16 DIAGNOSIS — R112 Nausea with vomiting, unspecified: Secondary | ICD-10-CM | POA: Diagnosis not present

## 2018-03-16 DIAGNOSIS — I5022 Chronic systolic (congestive) heart failure: Secondary | ICD-10-CM | POA: Diagnosis not present

## 2018-03-16 DIAGNOSIS — N184 Chronic kidney disease, stage 4 (severe): Secondary | ICD-10-CM | POA: Diagnosis not present

## 2018-03-16 DIAGNOSIS — D649 Anemia, unspecified: Secondary | ICD-10-CM | POA: Diagnosis not present

## 2018-03-16 DIAGNOSIS — N179 Acute kidney failure, unspecified: Secondary | ICD-10-CM | POA: Diagnosis not present

## 2018-03-17 DIAGNOSIS — R41841 Cognitive communication deficit: Secondary | ICD-10-CM | POA: Diagnosis not present

## 2018-03-17 DIAGNOSIS — I429 Cardiomyopathy, unspecified: Secondary | ICD-10-CM | POA: Diagnosis not present

## 2018-03-17 DIAGNOSIS — D509 Iron deficiency anemia, unspecified: Secondary | ICD-10-CM | POA: Diagnosis present

## 2018-03-17 DIAGNOSIS — N179 Acute kidney failure, unspecified: Secondary | ICD-10-CM | POA: Diagnosis present

## 2018-03-17 DIAGNOSIS — N183 Chronic kidney disease, stage 3 (moderate): Secondary | ICD-10-CM | POA: Diagnosis not present

## 2018-03-17 DIAGNOSIS — R279 Unspecified lack of coordination: Secondary | ICD-10-CM | POA: Diagnosis not present

## 2018-03-17 DIAGNOSIS — Z7952 Long term (current) use of systemic steroids: Secondary | ICD-10-CM | POA: Diagnosis not present

## 2018-03-17 DIAGNOSIS — M109 Gout, unspecified: Secondary | ICD-10-CM | POA: Diagnosis present

## 2018-03-17 DIAGNOSIS — I951 Orthostatic hypotension: Secondary | ICD-10-CM | POA: Diagnosis not present

## 2018-03-17 DIAGNOSIS — Z743 Need for continuous supervision: Secondary | ICD-10-CM | POA: Diagnosis not present

## 2018-03-17 DIAGNOSIS — R278 Other lack of coordination: Secondary | ICD-10-CM | POA: Diagnosis not present

## 2018-03-17 DIAGNOSIS — M86171 Other acute osteomyelitis, right ankle and foot: Secondary | ICD-10-CM | POA: Diagnosis not present

## 2018-03-17 DIAGNOSIS — Z89421 Acquired absence of other right toe(s): Secondary | ICD-10-CM | POA: Diagnosis not present

## 2018-03-17 DIAGNOSIS — Z794 Long term (current) use of insulin: Secondary | ICD-10-CM | POA: Diagnosis not present

## 2018-03-17 DIAGNOSIS — A4901 Methicillin susceptible Staphylococcus aureus infection, unspecified site: Secondary | ICD-10-CM | POA: Diagnosis not present

## 2018-03-17 DIAGNOSIS — Z89431 Acquired absence of right foot: Secondary | ICD-10-CM | POA: Diagnosis not present

## 2018-03-17 DIAGNOSIS — M6281 Muscle weakness (generalized): Secondary | ICD-10-CM | POA: Diagnosis not present

## 2018-03-17 DIAGNOSIS — E1121 Type 2 diabetes mellitus with diabetic nephropathy: Secondary | ICD-10-CM | POA: Diagnosis not present

## 2018-03-17 DIAGNOSIS — Z452 Encounter for adjustment and management of vascular access device: Secondary | ICD-10-CM | POA: Diagnosis not present

## 2018-03-17 DIAGNOSIS — I10 Essential (primary) hypertension: Secondary | ICD-10-CM | POA: Diagnosis not present

## 2018-03-17 DIAGNOSIS — E1169 Type 2 diabetes mellitus with other specified complication: Secondary | ICD-10-CM | POA: Diagnosis present

## 2018-03-17 DIAGNOSIS — I2510 Atherosclerotic heart disease of native coronary artery without angina pectoris: Secondary | ICD-10-CM | POA: Diagnosis present

## 2018-03-17 DIAGNOSIS — I739 Peripheral vascular disease, unspecified: Secondary | ICD-10-CM | POA: Diagnosis not present

## 2018-03-17 DIAGNOSIS — S3210XD Unspecified fracture of sacrum, subsequent encounter for fracture with routine healing: Secondary | ICD-10-CM | POA: Diagnosis not present

## 2018-03-17 DIAGNOSIS — E875 Hyperkalemia: Secondary | ICD-10-CM | POA: Diagnosis present

## 2018-03-17 DIAGNOSIS — A4101 Sepsis due to Methicillin susceptible Staphylococcus aureus: Secondary | ICD-10-CM | POA: Diagnosis not present

## 2018-03-17 DIAGNOSIS — Z951 Presence of aortocoronary bypass graft: Secondary | ICD-10-CM | POA: Diagnosis not present

## 2018-03-17 DIAGNOSIS — D631 Anemia in chronic kidney disease: Secondary | ICD-10-CM | POA: Diagnosis present

## 2018-03-17 DIAGNOSIS — I130 Hypertensive heart and chronic kidney disease with heart failure and stage 1 through stage 4 chronic kidney disease, or unspecified chronic kidney disease: Secondary | ICD-10-CM | POA: Diagnosis present

## 2018-03-17 DIAGNOSIS — Z6838 Body mass index (BMI) 38.0-38.9, adult: Secondary | ICD-10-CM | POA: Diagnosis not present

## 2018-03-17 DIAGNOSIS — I5020 Unspecified systolic (congestive) heart failure: Secondary | ICD-10-CM | POA: Diagnosis not present

## 2018-03-17 DIAGNOSIS — M1990 Unspecified osteoarthritis, unspecified site: Secondary | ICD-10-CM | POA: Diagnosis not present

## 2018-03-17 DIAGNOSIS — N170 Acute kidney failure with tubular necrosis: Secondary | ICD-10-CM | POA: Diagnosis not present

## 2018-03-17 DIAGNOSIS — N184 Chronic kidney disease, stage 4 (severe): Secondary | ICD-10-CM | POA: Diagnosis present

## 2018-03-17 DIAGNOSIS — E1122 Type 2 diabetes mellitus with diabetic chronic kidney disease: Secondary | ICD-10-CM | POA: Diagnosis present

## 2018-03-17 DIAGNOSIS — E1151 Type 2 diabetes mellitus with diabetic peripheral angiopathy without gangrene: Secondary | ICD-10-CM | POA: Diagnosis present

## 2018-03-17 DIAGNOSIS — R262 Difficulty in walking, not elsewhere classified: Secondary | ICD-10-CM | POA: Diagnosis not present

## 2018-03-17 DIAGNOSIS — G5603 Carpal tunnel syndrome, bilateral upper limbs: Secondary | ICD-10-CM | POA: Diagnosis not present

## 2018-03-17 DIAGNOSIS — Z7982 Long term (current) use of aspirin: Secondary | ICD-10-CM | POA: Diagnosis not present

## 2018-03-17 DIAGNOSIS — E1143 Type 2 diabetes mellitus with diabetic autonomic (poly)neuropathy: Secondary | ICD-10-CM | POA: Diagnosis not present

## 2018-03-17 DIAGNOSIS — R110 Nausea: Secondary | ICD-10-CM | POA: Diagnosis not present

## 2018-03-17 DIAGNOSIS — D638 Anemia in other chronic diseases classified elsewhere: Secondary | ICD-10-CM | POA: Diagnosis not present

## 2018-03-17 DIAGNOSIS — G894 Chronic pain syndrome: Secondary | ICD-10-CM | POA: Diagnosis not present

## 2018-03-17 DIAGNOSIS — E785 Hyperlipidemia, unspecified: Secondary | ICD-10-CM | POA: Diagnosis not present

## 2018-03-17 DIAGNOSIS — E669 Obesity, unspecified: Secondary | ICD-10-CM | POA: Diagnosis present

## 2018-03-17 DIAGNOSIS — I110 Hypertensive heart disease with heart failure: Secondary | ICD-10-CM | POA: Diagnosis not present

## 2018-03-17 DIAGNOSIS — Z95810 Presence of automatic (implantable) cardiac defibrillator: Secondary | ICD-10-CM | POA: Diagnosis not present

## 2018-03-17 DIAGNOSIS — I5022 Chronic systolic (congestive) heart failure: Secondary | ICD-10-CM | POA: Diagnosis present

## 2018-03-17 DIAGNOSIS — I4892 Unspecified atrial flutter: Secondary | ICD-10-CM | POA: Diagnosis present

## 2018-03-17 DIAGNOSIS — I509 Heart failure, unspecified: Secondary | ICD-10-CM | POA: Diagnosis not present

## 2018-03-17 DIAGNOSIS — R601 Generalized edema: Secondary | ICD-10-CM | POA: Diagnosis not present

## 2018-03-17 DIAGNOSIS — D649 Anemia, unspecified: Secondary | ICD-10-CM | POA: Diagnosis not present

## 2018-03-17 DIAGNOSIS — R7881 Bacteremia: Secondary | ICD-10-CM | POA: Diagnosis not present

## 2018-03-17 DIAGNOSIS — E119 Type 2 diabetes mellitus without complications: Secondary | ICD-10-CM | POA: Diagnosis not present

## 2018-03-17 DIAGNOSIS — I129 Hypertensive chronic kidney disease with stage 1 through stage 4 chronic kidney disease, or unspecified chronic kidney disease: Secondary | ICD-10-CM | POA: Diagnosis not present

## 2018-03-22 DIAGNOSIS — D649 Anemia, unspecified: Secondary | ICD-10-CM | POA: Diagnosis not present

## 2018-03-22 DIAGNOSIS — L89613 Pressure ulcer of right heel, stage 3: Secondary | ICD-10-CM | POA: Diagnosis not present

## 2018-03-22 DIAGNOSIS — G629 Polyneuropathy, unspecified: Secondary | ICD-10-CM | POA: Diagnosis not present

## 2018-03-22 DIAGNOSIS — I5022 Chronic systolic (congestive) heart failure: Secondary | ICD-10-CM | POA: Diagnosis not present

## 2018-03-22 DIAGNOSIS — N170 Acute kidney failure with tubular necrosis: Secondary | ICD-10-CM | POA: Diagnosis not present

## 2018-03-22 DIAGNOSIS — E1122 Type 2 diabetes mellitus with diabetic chronic kidney disease: Secondary | ICD-10-CM | POA: Diagnosis not present

## 2018-03-22 DIAGNOSIS — M109 Gout, unspecified: Secondary | ICD-10-CM | POA: Diagnosis not present

## 2018-03-22 DIAGNOSIS — M5022 Other cervical disc displacement, mid-cervical region, unspecified level: Secondary | ICD-10-CM | POA: Diagnosis not present

## 2018-03-22 DIAGNOSIS — L0390 Cellulitis, unspecified: Secondary | ICD-10-CM | POA: Diagnosis not present

## 2018-03-22 DIAGNOSIS — R42 Dizziness and giddiness: Secondary | ICD-10-CM | POA: Diagnosis not present

## 2018-03-22 DIAGNOSIS — Z7952 Long term (current) use of systemic steroids: Secondary | ICD-10-CM | POA: Diagnosis not present

## 2018-03-22 DIAGNOSIS — E875 Hyperkalemia: Secondary | ICD-10-CM | POA: Diagnosis not present

## 2018-03-22 DIAGNOSIS — I739 Peripheral vascular disease, unspecified: Secondary | ICD-10-CM | POA: Diagnosis not present

## 2018-03-22 DIAGNOSIS — R4182 Altered mental status, unspecified: Secondary | ICD-10-CM | POA: Diagnosis not present

## 2018-03-22 DIAGNOSIS — R278 Other lack of coordination: Secondary | ICD-10-CM | POA: Diagnosis not present

## 2018-03-22 DIAGNOSIS — N189 Chronic kidney disease, unspecified: Secondary | ICD-10-CM | POA: Diagnosis not present

## 2018-03-22 DIAGNOSIS — M5020 Other cervical disc displacement, unspecified cervical region: Secondary | ICD-10-CM | POA: Diagnosis not present

## 2018-03-22 DIAGNOSIS — M7989 Other specified soft tissue disorders: Secondary | ICD-10-CM | POA: Diagnosis not present

## 2018-03-22 DIAGNOSIS — L89623 Pressure ulcer of left heel, stage 3: Secondary | ICD-10-CM | POA: Diagnosis not present

## 2018-03-22 DIAGNOSIS — R253 Fasciculation: Secondary | ICD-10-CM | POA: Diagnosis not present

## 2018-03-22 DIAGNOSIS — Z95810 Presence of automatic (implantable) cardiac defibrillator: Secondary | ICD-10-CM | POA: Diagnosis not present

## 2018-03-22 DIAGNOSIS — R609 Edema, unspecified: Secondary | ICD-10-CM | POA: Diagnosis not present

## 2018-03-22 DIAGNOSIS — I2510 Atherosclerotic heart disease of native coronary artery without angina pectoris: Secondary | ICD-10-CM | POA: Diagnosis not present

## 2018-03-22 DIAGNOSIS — R41841 Cognitive communication deficit: Secondary | ICD-10-CM | POA: Diagnosis not present

## 2018-03-22 DIAGNOSIS — Z89421 Acquired absence of other right toe(s): Secondary | ICD-10-CM | POA: Diagnosis not present

## 2018-03-22 DIAGNOSIS — I4892 Unspecified atrial flutter: Secondary | ICD-10-CM | POA: Diagnosis not present

## 2018-03-22 DIAGNOSIS — R110 Nausea: Secondary | ICD-10-CM | POA: Diagnosis not present

## 2018-03-22 DIAGNOSIS — I129 Hypertensive chronic kidney disease with stage 1 through stage 4 chronic kidney disease, or unspecified chronic kidney disease: Secondary | ICD-10-CM | POA: Diagnosis not present

## 2018-03-22 DIAGNOSIS — N049 Nephrotic syndrome with unspecified morphologic changes: Secondary | ICD-10-CM | POA: Diagnosis not present

## 2018-03-22 DIAGNOSIS — G894 Chronic pain syndrome: Secondary | ICD-10-CM | POA: Diagnosis not present

## 2018-03-22 DIAGNOSIS — M86171 Other acute osteomyelitis, right ankle and foot: Secondary | ICD-10-CM | POA: Diagnosis not present

## 2018-03-22 DIAGNOSIS — I130 Hypertensive heart and chronic kidney disease with heart failure and stage 1 through stage 4 chronic kidney disease, or unspecified chronic kidney disease: Secondary | ICD-10-CM | POA: Diagnosis not present

## 2018-03-22 DIAGNOSIS — M79672 Pain in left foot: Secondary | ICD-10-CM | POA: Diagnosis not present

## 2018-03-22 DIAGNOSIS — D509 Iron deficiency anemia, unspecified: Secondary | ICD-10-CM | POA: Diagnosis not present

## 2018-03-22 DIAGNOSIS — N184 Chronic kidney disease, stage 4 (severe): Secondary | ICD-10-CM | POA: Diagnosis not present

## 2018-03-22 DIAGNOSIS — A4901 Methicillin susceptible Staphylococcus aureus infection, unspecified site: Secondary | ICD-10-CM | POA: Diagnosis not present

## 2018-03-22 DIAGNOSIS — D508 Other iron deficiency anemias: Secondary | ICD-10-CM | POA: Diagnosis not present

## 2018-03-22 DIAGNOSIS — I429 Cardiomyopathy, unspecified: Secondary | ICD-10-CM | POA: Diagnosis not present

## 2018-03-22 DIAGNOSIS — S3210XD Unspecified fracture of sacrum, subsequent encounter for fracture with routine healing: Secondary | ICD-10-CM | POA: Diagnosis not present

## 2018-03-22 DIAGNOSIS — Z4502 Encounter for adjustment and management of automatic implantable cardiac defibrillator: Secondary | ICD-10-CM | POA: Diagnosis not present

## 2018-03-22 DIAGNOSIS — Z743 Need for continuous supervision: Secondary | ICD-10-CM | POA: Diagnosis not present

## 2018-03-22 DIAGNOSIS — Z794 Long term (current) use of insulin: Secondary | ICD-10-CM | POA: Diagnosis not present

## 2018-03-22 DIAGNOSIS — I5020 Unspecified systolic (congestive) heart failure: Secondary | ICD-10-CM | POA: Diagnosis not present

## 2018-03-22 DIAGNOSIS — R262 Difficulty in walking, not elsewhere classified: Secondary | ICD-10-CM | POA: Diagnosis not present

## 2018-03-22 DIAGNOSIS — M6281 Muscle weakness (generalized): Secondary | ICD-10-CM | POA: Diagnosis not present

## 2018-03-22 DIAGNOSIS — N183 Chronic kidney disease, stage 3 (moderate): Secondary | ICD-10-CM | POA: Diagnosis not present

## 2018-03-22 DIAGNOSIS — E119 Type 2 diabetes mellitus without complications: Secondary | ICD-10-CM | POA: Diagnosis not present

## 2018-03-22 DIAGNOSIS — Z0000 Encounter for general adult medical examination without abnormal findings: Secondary | ICD-10-CM | POA: Diagnosis not present

## 2018-03-22 DIAGNOSIS — R279 Unspecified lack of coordination: Secondary | ICD-10-CM | POA: Diagnosis not present

## 2018-03-22 DIAGNOSIS — Z452 Encounter for adjustment and management of vascular access device: Secondary | ICD-10-CM | POA: Diagnosis not present

## 2018-03-22 DIAGNOSIS — M25572 Pain in left ankle and joints of left foot: Secondary | ICD-10-CM | POA: Diagnosis not present

## 2018-03-22 DIAGNOSIS — M1990 Unspecified osteoarthritis, unspecified site: Secondary | ICD-10-CM | POA: Diagnosis not present

## 2018-03-22 DIAGNOSIS — R7881 Bacteremia: Secondary | ICD-10-CM | POA: Diagnosis not present

## 2018-03-22 DIAGNOSIS — K122 Cellulitis and abscess of mouth: Secondary | ICD-10-CM | POA: Diagnosis not present

## 2018-03-22 DIAGNOSIS — I10 Essential (primary) hypertension: Secondary | ICD-10-CM | POA: Diagnosis not present

## 2018-03-22 DIAGNOSIS — M4802 Spinal stenosis, cervical region: Secondary | ICD-10-CM | POA: Diagnosis not present

## 2018-03-22 DIAGNOSIS — I951 Orthostatic hypotension: Secondary | ICD-10-CM | POA: Diagnosis not present

## 2018-03-22 DIAGNOSIS — D638 Anemia in other chronic diseases classified elsewhere: Secondary | ICD-10-CM | POA: Diagnosis not present

## 2018-03-22 DIAGNOSIS — G5603 Carpal tunnel syndrome, bilateral upper limbs: Secondary | ICD-10-CM | POA: Diagnosis not present

## 2018-03-22 DIAGNOSIS — M5126 Other intervertebral disc displacement, lumbar region: Secondary | ICD-10-CM | POA: Diagnosis not present

## 2018-03-22 DIAGNOSIS — A4101 Sepsis due to Methicillin susceptible Staphylococcus aureus: Secondary | ICD-10-CM | POA: Diagnosis not present

## 2018-03-22 DIAGNOSIS — G5623 Lesion of ulnar nerve, bilateral upper limbs: Secondary | ICD-10-CM | POA: Diagnosis not present

## 2018-03-22 DIAGNOSIS — M545 Low back pain: Secondary | ICD-10-CM | POA: Diagnosis not present

## 2018-03-22 DIAGNOSIS — M47816 Spondylosis without myelopathy or radiculopathy, lumbar region: Secondary | ICD-10-CM | POA: Diagnosis not present

## 2018-03-22 DIAGNOSIS — M48061 Spinal stenosis, lumbar region without neurogenic claudication: Secondary | ICD-10-CM | POA: Diagnosis not present

## 2018-03-22 DIAGNOSIS — E785 Hyperlipidemia, unspecified: Secondary | ICD-10-CM | POA: Diagnosis not present

## 2018-03-22 DIAGNOSIS — I110 Hypertensive heart disease with heart failure: Secondary | ICD-10-CM | POA: Diagnosis not present

## 2018-03-22 DIAGNOSIS — N179 Acute kidney failure, unspecified: Secondary | ICD-10-CM | POA: Diagnosis not present

## 2018-03-22 DIAGNOSIS — D631 Anemia in chronic kidney disease: Secondary | ICD-10-CM | POA: Diagnosis not present

## 2018-03-22 DIAGNOSIS — E1143 Type 2 diabetes mellitus with diabetic autonomic (poly)neuropathy: Secondary | ICD-10-CM | POA: Diagnosis not present

## 2018-03-23 DIAGNOSIS — N183 Chronic kidney disease, stage 3 (moderate): Secondary | ICD-10-CM | POA: Diagnosis not present

## 2018-03-23 DIAGNOSIS — I5022 Chronic systolic (congestive) heart failure: Secondary | ICD-10-CM | POA: Diagnosis not present

## 2018-03-23 DIAGNOSIS — N049 Nephrotic syndrome with unspecified morphologic changes: Secondary | ICD-10-CM | POA: Diagnosis not present

## 2018-03-23 DIAGNOSIS — Z794 Long term (current) use of insulin: Secondary | ICD-10-CM | POA: Diagnosis not present

## 2018-03-23 DIAGNOSIS — N184 Chronic kidney disease, stage 4 (severe): Secondary | ICD-10-CM | POA: Diagnosis not present

## 2018-03-23 DIAGNOSIS — I129 Hypertensive chronic kidney disease with stage 1 through stage 4 chronic kidney disease, or unspecified chronic kidney disease: Secondary | ICD-10-CM | POA: Diagnosis not present

## 2018-03-23 DIAGNOSIS — N179 Acute kidney failure, unspecified: Secondary | ICD-10-CM | POA: Diagnosis not present

## 2018-03-23 DIAGNOSIS — E1122 Type 2 diabetes mellitus with diabetic chronic kidney disease: Secondary | ICD-10-CM | POA: Diagnosis not present

## 2018-03-23 DIAGNOSIS — G894 Chronic pain syndrome: Secondary | ICD-10-CM | POA: Diagnosis not present

## 2018-03-30 DIAGNOSIS — N179 Acute kidney failure, unspecified: Secondary | ICD-10-CM | POA: Diagnosis not present

## 2018-03-30 DIAGNOSIS — G894 Chronic pain syndrome: Secondary | ICD-10-CM | POA: Diagnosis not present

## 2018-03-30 DIAGNOSIS — N183 Chronic kidney disease, stage 3 (moderate): Secondary | ICD-10-CM | POA: Diagnosis not present

## 2018-03-30 DIAGNOSIS — I5022 Chronic systolic (congestive) heart failure: Secondary | ICD-10-CM | POA: Diagnosis not present

## 2018-04-01 DIAGNOSIS — R42 Dizziness and giddiness: Secondary | ICD-10-CM | POA: Diagnosis not present

## 2018-04-01 DIAGNOSIS — R253 Fasciculation: Secondary | ICD-10-CM | POA: Diagnosis not present

## 2018-04-01 DIAGNOSIS — R4182 Altered mental status, unspecified: Secondary | ICD-10-CM | POA: Diagnosis not present

## 2018-04-01 DIAGNOSIS — N179 Acute kidney failure, unspecified: Secondary | ICD-10-CM | POA: Diagnosis not present

## 2018-04-04 DIAGNOSIS — I5022 Chronic systolic (congestive) heart failure: Secondary | ICD-10-CM | POA: Diagnosis not present

## 2018-04-04 DIAGNOSIS — N189 Chronic kidney disease, unspecified: Secondary | ICD-10-CM | POA: Diagnosis not present

## 2018-04-04 DIAGNOSIS — E1122 Type 2 diabetes mellitus with diabetic chronic kidney disease: Secondary | ICD-10-CM | POA: Diagnosis not present

## 2018-04-07 DIAGNOSIS — L0390 Cellulitis, unspecified: Secondary | ICD-10-CM | POA: Diagnosis not present

## 2018-04-07 DIAGNOSIS — R609 Edema, unspecified: Secondary | ICD-10-CM | POA: Diagnosis not present

## 2018-04-14 DIAGNOSIS — M5020 Other cervical disc displacement, unspecified cervical region: Secondary | ICD-10-CM | POA: Diagnosis not present

## 2018-04-14 DIAGNOSIS — M545 Low back pain: Secondary | ICD-10-CM | POA: Diagnosis not present

## 2018-04-18 DIAGNOSIS — N184 Chronic kidney disease, stage 4 (severe): Secondary | ICD-10-CM | POA: Diagnosis not present

## 2018-04-18 DIAGNOSIS — N183 Chronic kidney disease, stage 3 (moderate): Secondary | ICD-10-CM | POA: Diagnosis not present

## 2018-04-18 DIAGNOSIS — I130 Hypertensive heart and chronic kidney disease with heart failure and stage 1 through stage 4 chronic kidney disease, or unspecified chronic kidney disease: Secondary | ICD-10-CM | POA: Diagnosis not present

## 2018-04-18 DIAGNOSIS — I129 Hypertensive chronic kidney disease with stage 1 through stage 4 chronic kidney disease, or unspecified chronic kidney disease: Secondary | ICD-10-CM | POA: Diagnosis not present

## 2018-04-18 DIAGNOSIS — I5022 Chronic systolic (congestive) heart failure: Secondary | ICD-10-CM | POA: Diagnosis not present

## 2018-04-18 DIAGNOSIS — D509 Iron deficiency anemia, unspecified: Secondary | ICD-10-CM | POA: Diagnosis not present

## 2018-04-18 DIAGNOSIS — I2510 Atherosclerotic heart disease of native coronary artery without angina pectoris: Secondary | ICD-10-CM | POA: Diagnosis not present

## 2018-04-20 DIAGNOSIS — G629 Polyneuropathy, unspecified: Secondary | ICD-10-CM | POA: Diagnosis not present

## 2018-04-22 DIAGNOSIS — N049 Nephrotic syndrome with unspecified morphologic changes: Secondary | ICD-10-CM | POA: Diagnosis not present

## 2018-04-22 DIAGNOSIS — I129 Hypertensive chronic kidney disease with stage 1 through stage 4 chronic kidney disease, or unspecified chronic kidney disease: Secondary | ICD-10-CM | POA: Diagnosis not present

## 2018-04-22 DIAGNOSIS — Z794 Long term (current) use of insulin: Secondary | ICD-10-CM | POA: Diagnosis not present

## 2018-04-22 DIAGNOSIS — N184 Chronic kidney disease, stage 4 (severe): Secondary | ICD-10-CM | POA: Diagnosis not present

## 2018-04-22 DIAGNOSIS — E1122 Type 2 diabetes mellitus with diabetic chronic kidney disease: Secondary | ICD-10-CM | POA: Diagnosis not present

## 2018-04-25 DIAGNOSIS — G894 Chronic pain syndrome: Secondary | ICD-10-CM | POA: Diagnosis not present

## 2018-04-25 DIAGNOSIS — E1122 Type 2 diabetes mellitus with diabetic chronic kidney disease: Secondary | ICD-10-CM | POA: Diagnosis not present

## 2018-04-25 DIAGNOSIS — D649 Anemia, unspecified: Secondary | ICD-10-CM | POA: Diagnosis not present

## 2018-04-25 DIAGNOSIS — I5022 Chronic systolic (congestive) heart failure: Secondary | ICD-10-CM | POA: Diagnosis not present

## 2018-04-30 DIAGNOSIS — D508 Other iron deficiency anemias: Secondary | ICD-10-CM | POA: Diagnosis not present

## 2018-04-30 DIAGNOSIS — Z0000 Encounter for general adult medical examination without abnormal findings: Secondary | ICD-10-CM | POA: Diagnosis not present

## 2018-04-30 DIAGNOSIS — I10 Essential (primary) hypertension: Secondary | ICD-10-CM | POA: Diagnosis not present

## 2018-04-30 DIAGNOSIS — E1122 Type 2 diabetes mellitus with diabetic chronic kidney disease: Secondary | ICD-10-CM | POA: Diagnosis not present

## 2018-04-30 DIAGNOSIS — D649 Anemia, unspecified: Secondary | ICD-10-CM | POA: Diagnosis not present

## 2018-05-02 DIAGNOSIS — G5623 Lesion of ulnar nerve, bilateral upper limbs: Secondary | ICD-10-CM | POA: Diagnosis not present

## 2018-05-02 DIAGNOSIS — N189 Chronic kidney disease, unspecified: Secondary | ICD-10-CM | POA: Diagnosis not present

## 2018-05-02 DIAGNOSIS — I5022 Chronic systolic (congestive) heart failure: Secondary | ICD-10-CM | POA: Diagnosis not present

## 2018-05-02 DIAGNOSIS — K122 Cellulitis and abscess of mouth: Secondary | ICD-10-CM | POA: Diagnosis not present

## 2018-05-09 DIAGNOSIS — G5623 Lesion of ulnar nerve, bilateral upper limbs: Secondary | ICD-10-CM | POA: Diagnosis not present

## 2018-05-09 DIAGNOSIS — I5022 Chronic systolic (congestive) heart failure: Secondary | ICD-10-CM | POA: Diagnosis not present

## 2018-05-09 DIAGNOSIS — N189 Chronic kidney disease, unspecified: Secondary | ICD-10-CM | POA: Diagnosis not present

## 2018-05-09 DIAGNOSIS — G894 Chronic pain syndrome: Secondary | ICD-10-CM | POA: Diagnosis not present

## 2018-05-10 DIAGNOSIS — I429 Cardiomyopathy, unspecified: Secondary | ICD-10-CM | POA: Diagnosis not present

## 2018-05-10 DIAGNOSIS — Z4502 Encounter for adjustment and management of automatic implantable cardiac defibrillator: Secondary | ICD-10-CM | POA: Diagnosis not present

## 2018-05-12 DIAGNOSIS — M5020 Other cervical disc displacement, unspecified cervical region: Secondary | ICD-10-CM | POA: Diagnosis not present

## 2018-05-12 DIAGNOSIS — M545 Low back pain: Secondary | ICD-10-CM | POA: Diagnosis not present

## 2018-05-12 DIAGNOSIS — M47816 Spondylosis without myelopathy or radiculopathy, lumbar region: Secondary | ICD-10-CM | POA: Diagnosis not present

## 2018-05-12 DIAGNOSIS — M48061 Spinal stenosis, lumbar region without neurogenic claudication: Secondary | ICD-10-CM | POA: Diagnosis not present

## 2018-05-12 DIAGNOSIS — M4802 Spinal stenosis, cervical region: Secondary | ICD-10-CM | POA: Diagnosis not present

## 2018-05-12 DIAGNOSIS — M5126 Other intervertebral disc displacement, lumbar region: Secondary | ICD-10-CM | POA: Diagnosis not present

## 2018-05-12 DIAGNOSIS — M5022 Other cervical disc displacement, mid-cervical region, unspecified level: Secondary | ICD-10-CM | POA: Diagnosis not present

## 2018-05-13 DIAGNOSIS — I5022 Chronic systolic (congestive) heart failure: Secondary | ICD-10-CM | POA: Diagnosis not present

## 2018-05-13 DIAGNOSIS — L89613 Pressure ulcer of right heel, stage 3: Secondary | ICD-10-CM | POA: Diagnosis not present

## 2018-05-13 DIAGNOSIS — N184 Chronic kidney disease, stage 4 (severe): Secondary | ICD-10-CM | POA: Diagnosis not present

## 2018-05-13 DIAGNOSIS — L89623 Pressure ulcer of left heel, stage 3: Secondary | ICD-10-CM | POA: Diagnosis not present

## 2018-05-13 DIAGNOSIS — R609 Edema, unspecified: Secondary | ICD-10-CM | POA: Diagnosis not present

## 2018-05-16 DIAGNOSIS — G5623 Lesion of ulnar nerve, bilateral upper limbs: Secondary | ICD-10-CM | POA: Diagnosis not present

## 2018-05-16 DIAGNOSIS — N184 Chronic kidney disease, stage 4 (severe): Secondary | ICD-10-CM | POA: Diagnosis not present

## 2018-05-16 DIAGNOSIS — G894 Chronic pain syndrome: Secondary | ICD-10-CM | POA: Diagnosis not present

## 2018-05-16 DIAGNOSIS — I5022 Chronic systolic (congestive) heart failure: Secondary | ICD-10-CM | POA: Diagnosis not present

## 2018-05-17 DIAGNOSIS — M4802 Spinal stenosis, cervical region: Secondary | ICD-10-CM | POA: Diagnosis not present

## 2018-05-17 DIAGNOSIS — M545 Low back pain: Secondary | ICD-10-CM | POA: Diagnosis not present

## 2018-05-18 DIAGNOSIS — G894 Chronic pain syndrome: Secondary | ICD-10-CM | POA: Diagnosis not present

## 2018-05-18 DIAGNOSIS — E1122 Type 2 diabetes mellitus with diabetic chronic kidney disease: Secondary | ICD-10-CM | POA: Diagnosis not present

## 2018-05-18 DIAGNOSIS — I5022 Chronic systolic (congestive) heart failure: Secondary | ICD-10-CM | POA: Diagnosis not present

## 2018-05-18 DIAGNOSIS — N184 Chronic kidney disease, stage 4 (severe): Secondary | ICD-10-CM | POA: Diagnosis not present

## 2018-05-24 ENCOUNTER — Other Ambulatory Visit: Payer: Self-pay

## 2018-05-24 ENCOUNTER — Inpatient Hospital Stay
Admission: EM | Admit: 2018-05-24 | Discharge: 2018-05-29 | DRG: 291 | Disposition: A | Discharge status: Home Health | Payer: Medicare Other | Attending: Internal Medicine | Admitting: Internal Medicine

## 2018-05-24 DIAGNOSIS — L89609 Pressure ulcer of unspecified heel, unspecified stage: Secondary | ICD-10-CM

## 2018-05-24 DIAGNOSIS — I252 Old myocardial infarction: Secondary | ICD-10-CM

## 2018-05-24 DIAGNOSIS — Z888 Allergy status to other drugs, medicaments and biological substances status: Secondary | ICD-10-CM

## 2018-05-24 DIAGNOSIS — I5023 Acute on chronic systolic (congestive) heart failure: Secondary | ICD-10-CM | POA: Diagnosis not present

## 2018-05-24 DIAGNOSIS — R0602 Shortness of breath: Secondary | ICD-10-CM | POA: Diagnosis not present

## 2018-05-24 DIAGNOSIS — Z833 Family history of diabetes mellitus: Secondary | ICD-10-CM

## 2018-05-24 DIAGNOSIS — Z951 Presence of aortocoronary bypass graft: Secondary | ICD-10-CM

## 2018-05-24 DIAGNOSIS — I4891 Unspecified atrial fibrillation: Secondary | ICD-10-CM | POA: Diagnosis present

## 2018-05-24 DIAGNOSIS — E11621 Type 2 diabetes mellitus with foot ulcer: Secondary | ICD-10-CM | POA: Diagnosis present

## 2018-05-24 DIAGNOSIS — E1165 Type 2 diabetes mellitus with hyperglycemia: Secondary | ICD-10-CM | POA: Diagnosis not present

## 2018-05-24 DIAGNOSIS — Z79891 Long term (current) use of opiate analgesic: Secondary | ICD-10-CM

## 2018-05-24 DIAGNOSIS — Z7982 Long term (current) use of aspirin: Secondary | ICD-10-CM

## 2018-05-24 DIAGNOSIS — M1A9XX0 Chronic gout, unspecified, without tophus (tophi): Secondary | ICD-10-CM | POA: Diagnosis present

## 2018-05-24 DIAGNOSIS — I2510 Atherosclerotic heart disease of native coronary artery without angina pectoris: Secondary | ICD-10-CM | POA: Diagnosis present

## 2018-05-24 DIAGNOSIS — Z95810 Presence of automatic (implantable) cardiac defibrillator: Secondary | ICD-10-CM

## 2018-05-24 DIAGNOSIS — E11649 Type 2 diabetes mellitus with hypoglycemia without coma: Secondary | ICD-10-CM | POA: Diagnosis present

## 2018-05-24 DIAGNOSIS — Z9049 Acquired absence of other specified parts of digestive tract: Secondary | ICD-10-CM

## 2018-05-24 DIAGNOSIS — N179 Acute kidney failure, unspecified: Secondary | ICD-10-CM | POA: Diagnosis not present

## 2018-05-24 DIAGNOSIS — Z8249 Family history of ischemic heart disease and other diseases of the circulatory system: Secondary | ICD-10-CM

## 2018-05-24 DIAGNOSIS — E1140 Type 2 diabetes mellitus with diabetic neuropathy, unspecified: Secondary | ICD-10-CM | POA: Diagnosis present

## 2018-05-24 DIAGNOSIS — R531 Weakness: Secondary | ICD-10-CM | POA: Diagnosis not present

## 2018-05-24 DIAGNOSIS — I130 Hypertensive heart and chronic kidney disease with heart failure and stage 1 through stage 4 chronic kidney disease, or unspecified chronic kidney disease: Principal | ICD-10-CM | POA: Diagnosis present

## 2018-05-24 DIAGNOSIS — E1151 Type 2 diabetes mellitus with diabetic peripheral angiopathy without gangrene: Secondary | ICD-10-CM | POA: Diagnosis present

## 2018-05-24 DIAGNOSIS — L89620 Pressure ulcer of left heel, unstageable: Secondary | ICD-10-CM | POA: Diagnosis not present

## 2018-05-24 DIAGNOSIS — R601 Generalized edema: Secondary | ICD-10-CM | POA: Diagnosis not present

## 2018-05-24 DIAGNOSIS — L8990 Pressure ulcer of unspecified site, unspecified stage: Secondary | ICD-10-CM

## 2018-05-24 DIAGNOSIS — Z89421 Acquired absence of other right toe(s): Secondary | ICD-10-CM

## 2018-05-24 DIAGNOSIS — N184 Chronic kidney disease, stage 4 (severe): Secondary | ICD-10-CM | POA: Diagnosis not present

## 2018-05-24 DIAGNOSIS — W19XXXA Unspecified fall, initial encounter: Secondary | ICD-10-CM | POA: Diagnosis not present

## 2018-05-24 DIAGNOSIS — L89610 Pressure ulcer of right heel, unstageable: Secondary | ICD-10-CM | POA: Diagnosis not present

## 2018-05-24 DIAGNOSIS — D631 Anemia in chronic kidney disease: Secondary | ICD-10-CM | POA: Diagnosis present

## 2018-05-24 DIAGNOSIS — R609 Edema, unspecified: Secondary | ICD-10-CM | POA: Diagnosis not present

## 2018-05-24 DIAGNOSIS — Z7901 Long term (current) use of anticoagulants: Secondary | ICD-10-CM

## 2018-05-24 DIAGNOSIS — Z881 Allergy status to other antibiotic agents status: Secondary | ICD-10-CM

## 2018-05-24 DIAGNOSIS — Z886 Allergy status to analgesic agent status: Secondary | ICD-10-CM

## 2018-05-24 DIAGNOSIS — Z8349 Family history of other endocrine, nutritional and metabolic diseases: Secondary | ICD-10-CM

## 2018-05-24 DIAGNOSIS — I255 Ischemic cardiomyopathy: Secondary | ICD-10-CM | POA: Diagnosis present

## 2018-05-24 DIAGNOSIS — W050XXA Fall from non-moving wheelchair, initial encounter: Secondary | ICD-10-CM | POA: Diagnosis present

## 2018-05-24 DIAGNOSIS — E1122 Type 2 diabetes mellitus with diabetic chronic kidney disease: Secondary | ICD-10-CM | POA: Diagnosis present

## 2018-05-24 DIAGNOSIS — Z8673 Personal history of transient ischemic attack (TIA), and cerebral infarction without residual deficits: Secondary | ICD-10-CM

## 2018-05-24 DIAGNOSIS — M5137 Other intervertebral disc degeneration, lumbosacral region: Secondary | ICD-10-CM | POA: Diagnosis present

## 2018-05-24 DIAGNOSIS — Z79899 Other long term (current) drug therapy: Secondary | ICD-10-CM

## 2018-05-24 NOTE — ED Provider Notes (Signed)
[facility] Emergency Department Provider Note  ____________________________________________   First MD Initiated Contact with Patient 05/24/18 2347     (approximate)  I have reviewed the triage vital signs and the nursing notes.   HISTORY  Chief Complaint Weakness and Fall   HPI Paul [name]. is a 53 y.o. male comes to the emergency department via EMS with fecal incontinence and bilateral lower extremity swelling for the past several days.  The patient has a complex past medical history including hypertension, diabetes mellitus, chronic kidney disease, coronary artery disease, and remote stroke.  Apparently the patient was admitted to [facility] in [place] and discharged recently to a rehabilitation facility.  3 or 4 days ago he left the rehabilitation facility and moved in with family here in [place].  He says that he has not had access to all of his medications and for the past several days his health is generally deteriorated.  Today he felt that his legs have become acutely more swollen and he was unable to walk.  He had a large bowel movement was unable to clean himself was unable to get up and ambulate so he called 911.  He said his legs have become swollen over the past 3 days or so.  He feels like his legs are "weeping" fluid.  He denies chest pain although does report shortness of breath.  He does report diffuse moderate severity cramping abdominal pain that is nonradiating.  Nothing in particular seems to make his symptoms better or worse.    Past Medical History:  Diagnosis Date  . Anemia   . Cardiac defibrillator in place    a. Biotronik LUmax 540 DRT, (ser # [id number]).  . Carotid arterial disease ([place])    a. s/p prior LICA stenting;  b. 12/5883 Carotid U/S: 40-59% bilat ICA stenosis. Patent LICA stent.  . CKD (chronic kidney disease), stage III ([place])   . Coronary artery disease    a. 2010 s/p CABG x 3.  . DDD (degenerative disc  disease), lumbosacral    L5-S1  . Diabetes ([place])    Lantus at bedtime  . Gangrene of toe of right foot ([place])   . Gout of left hand 10/06/2016  . HFrEF (heart failure with reduced ejection fraction) ([place])    a. 07/2016 Echo: EF 25-30%, diff HK, mild MR, mildly dil LA, mod reduced RV fxn, PASP 8mmHg.  [place] Kitchen Hypertension    takes Coreg daily  . Ischemic cardiomyopathy    a. 07/2016 Echo: EF 25-30%, diff HK.  . Myocardial infarction ([place]) 2009  . Peripheral vascular disease ([place])   . Sleep apnea    sleep study yr ago-unable to afford cpap  . Stroke [facility]) 09   no weakness    Patient Active Problem List   Diagnosis Date Noted  . Anasarca 05/25/2018  . Pressure injury of skin 05/25/2018  . CKD (chronic kidney disease), stage III ([place]) 12/15/2016  . Ulcerated, foot ([place]) 12/15/2016  . Gangrene of foot ([place]) 08/21/2016  . HTN (hypertension) 08/21/2016  . Diabetes ([place]) 08/21/2016  . CAD (coronary artery disease) 08/21/2016  . Chronic systolic CHF (congestive heart failure) ([place]) 08/21/2016  . Diabetic foot infection ([place]) 08/21/2016  . Heterotopic ossification of bone 07/12/2014    Past Surgical History:  Procedure Laterality Date  . AMPUTATION TOE Right 08/22/2016   Procedure: AMPUTATION TOE;  Surgeon: Samara Deist, DPM;  Location: [facility] ORS;  Service: Podiatry;  Laterality: Right;  . AMPUTATION TOE  Right 10/09/2016   Procedure: AMPUTATION TOE-RIGHT 2ND MPJ;  Surgeon: Samara Deist, DPM;  Location: [facility] ORS;  Service: Podiatry;  Laterality: Right;  . APLIGRAFT PLACEMENT Right 10/09/2016   Procedure: APLIGRAFT PLACEMENT;  Surgeon: Samara Deist, DPM;  Location: [facility] ORS;  Service: Podiatry;  Laterality: Right;  . CARDIAC DEFIBRILLATOR PLACEMENT  2011  . CAROTID ENDARTERECTOMY Left   . CHOLECYSTECTOMY  2010  . CORONARY ARTERY BYPASS GRAFT  2010   CABG x 3   . GRAFT APPLICATION Right 05/22/1476   Procedure: FULL THICKNESS SKIN GRAFT-RIGHT FOOT;  Surgeon: Algernon Huxley, MD;  Location: [facility]  ORS;  Service: Vascular;  Laterality: Right;  . HIP SURGERY Right 1994  . INCISION AND DRAINAGE OF WOUND Right 08/22/2016   Procedure: IRRIGATION AND DEBRIDEMENT WOUND and wound vac placement;  Surgeon: Samara Deist, DPM;  Location: [facility] ORS;  Service: Podiatry;  Laterality: Right;  . LOWER EXTREMITY ANGIOGRAPHY Right 08/24/2016   Procedure: Lower Extremity Angiography;  Surgeon: Algernon Huxley, MD;  Location: [facility];  Service: Cardiovascular;  Laterality: Right;  . MASS EXCISION Right 07/18/2014   Procedure: EXCISION HETEROTOPIC BONE RIGHT HIP;  Surgeon: Frederik Pear, MD;  Location: [facility];  Service: Orthopedics;  Laterality: Right;  . Open Heart Surgery  2010   x 3  . WOUND DEBRIDEMENT Right 10/09/2016   Procedure: DEBRIDEMENT WOUND;  Surgeon: Samara Deist, DPM;  Location: [facility] ORS;  Service: Podiatry;  Laterality: Right;    Prior to Admission medications   Medication Sig Start Date End Date Taking? Authorizing Provider  aspirin EC 81 MG tablet Take 1 tablet (81 mg total) by mouth daily. 10/21/16  Yes Theora Gianotti, NP  carvedilol (COREG) 12.5 MG tablet Take 12.5 mg by mouth 2 (two) times daily.    Yes [provider]  diphenhydramine-acetaminophen (TYLENOL PM EXTRA STRENGTH) 25-500 MG TABS tablet Take 2 tablets by mouth at bedtime as needed (mild pain).   Yes [provider]  gabapentin (NEURONTIN) 400 MG capsule Take 400 mg by mouth 3 (three) times daily.  03/03/18  Yes [provider]  insulin glargine (LANTUS) 100 unit/mL SOPN Inject 15 Units into the skin 2 (two) times daily.    Yes [provider]  oxyCODONE-acetaminophen (PERCOCET/ROXICET) 5-325 MG tablet Take 1 tablet by mouth every 4 (four) hours as needed for severe pain. 01/05/18  Yes Tegeler, Gwenyth Allegra, MD  Rivaroxaban (XARELTO) 15 MG TABS tablet Take 15 mg by mouth daily with supper.    Yes [provider]  senna-docusate (SENOKOT-S) 8.6-50 MG tablet Take 2 tablets  by mouth at bedtime.    Yes [provider]    Allergies Ibuprofen; Baclofen; Metformin; and Nsaids  Family History  Problem Relation Age of Onset  . Hypertension Other   . Diabetes Other   . Diabetes Father   . Hyperlipidemia Father   . Hypertension Father   . Hyperlipidemia Sister   . Hypertension Sister   . Diabetes Sister   . Diabetes Brother   . Hypertension Brother   . Hyperlipidemia Brother     Social History Social History   Tobacco Use  . Smoking status: Never Smoker  . Smokeless tobacco: Never Used  Substance Use Topics  . Alcohol use: No  . Drug use: No    Review of Systems Constitutional: No fever/chills Eyes: No visual changes. ENT: No sore throat. Cardiovascular: Denies chest pain. Respiratory: Positive for shortness of breath. Gastrointestinal: Positive for abdominal pain.  Positive  for nausea, no vomiting.  Positive for diarrhea.  No constipation. Genitourinary: Negative for dysuria. Musculoskeletal: Negative for back pain. Skin: Negative for rash. Neurological: Negative for headaches, focal weakness or numbness.   ____________________________________________   PHYSICAL EXAM:  VITAL SIGNS: ED Triage Vitals  Enc Vitals Group     BP      Pulse      Resp      Temp      Temp src      SpO2      Weight      Height      Head Circumference      Peak Flow      Pain Score      Pain Loc      Pain Edu?      Excl. in [place]?     Constitutional: Alert and oriented x4 appears anxious with increased respiratory rate.  Heavy smell of feces Eyes: PERRL EOMI. Head: Atraumatic. Nose: No congestion/rhinnorhea. Mouth/Throat: No trismus Neck: No stridor.  Able to lie completely flat although with some difficulty and large JVD Cardiovascular: Normal rate, regular rhythm. Grossly normal heart sounds.  Good peripheral circulation. Respiratory: Somewhat increased respiratory effort with crackles at bilateral bases Gastrointestinal: Soft abdomen  diffuse mild tenderness with no focality no rebound no guarding and no frank peritonitis Musculoskeletal: Anasarca to his groin bilaterally.  Legs are extremely swollen although equal in size Neurologic:  Normal speech and language. No gross focal neurologic deficits are appreciated. Skin: Bilateral lower extremities quite swollen and weeping thin fluid Psychiatric: Mood and affect are normal. Speech and behavior are normal.    ____________________________________________   DIFFERENTIAL includes but not limited to  C. difficile, infectious diarrhea, inflammatory diarrhea, heart failure, kidney disease, pulmonary edema ____________________________________________   LABS (all labs ordered are listed, but only abnormal results are displayed)  Labs Reviewed  BRAIN NATRIURETIC PEPTIDE - Abnormal; Notable for the following components:      Result Value   B Natriuretic Peptide 129.0 (*)    All other components within normal limits  CBC WITH DIFFERENTIAL/PLATELET - Abnormal; Notable for the following components:   WBC 17.8 (*)    RBC 3.83 (*)    Hemoglobin 9.8 (*)    HCT 32.6 (*)    MCH 25.6 (*)    RDW 18.0 (*)    Neutro Abs 16.2 (*)    Lymphs Abs 0.5 (*)    Abs Immature Granulocytes 0.16 (*)    All other components within normal limits  URINALYSIS, COMPLETE (UACMP) WITH MICROSCOPIC - Abnormal; Notable for the following components:   Color, Urine YELLOW (*)    APPearance CLEAR (*)    Glucose, UA 50 (*)    All other components within normal limits  COMPREHENSIVE METABOLIC PANEL - Abnormal; Notable for the following components:   Glucose, Bld 398 (*)    BUN 80 (*)    Creatinine, Ser 2.81 (*)    Calcium 8.7 (*)    Albumin 3.1 (*)    AST 44 (*)    Alkaline Phosphatase 189 (*)    GFR calc non Af Amer 25 (*)    GFR calc Af Amer 28 (*)    All other components within normal limits  TROPONIN I - Abnormal; Notable for the following components:   Troponin I 0.06 (*)    All other  components within normal limits  HEMOGLOBIN A1C - Abnormal; Notable for the following components:   Hgb A1c MFr Bld 7.9 (*)  All other components within normal limits  BASIC METABOLIC PANEL - Abnormal; Notable for the following components:   Potassium 3.3 (*)    Glucose, Bld 305 (*)    BUN 79 (*)    Creatinine, Ser 2.57 (*)    Calcium 8.4 (*)    GFR calc non Af Amer 27 (*)    GFR calc Af Amer 32 (*)    All other components within normal limits  CBC - Abnormal; Notable for the following components:   WBC 17.6 (*)    RBC 3.33 (*)    Hemoglobin 8.6 (*)    HCT 28.6 (*)    MCH 25.8 (*)    RDW 18.0 (*)    All other components within normal limits  GLUCOSE, CAPILLARY - Abnormal; Notable for the following components:   Glucose-Capillary 235 (*)    All other components within normal limits  GLUCOSE, CAPILLARY - Abnormal; Notable for the following components:   Glucose-Capillary 123 (*)    All other components within normal limits  SEDIMENTATION RATE - Abnormal; Notable for the following components:   Sed Rate 47 (*)    All other components within normal limits  GLUCOSE, CAPILLARY - Abnormal; Notable for the following components:   Glucose-Capillary 108 (*)    All other components within normal limits  CBC - Abnormal; Notable for the following components:   WBC 11.2 (*)    RBC 3.14 (*)    Hemoglobin 8.0 (*)    HCT 26.6 (*)    MCH 25.5 (*)    RDW 18.3 (*)    All other components within normal limits  BASIC METABOLIC PANEL - Abnormal; Notable for the following components:   BUN 76 (*)    Creatinine, Ser 2.81 (*)    Calcium 8.2 (*)    GFR calc non Af Amer 25 (*)    GFR calc Af Amer 28 (*)    All other components within normal limits  GLUCOSE, CAPILLARY - Abnormal; Notable for the following components:   Glucose-Capillary 55 (*)    All other components within normal limits  GLUCOSE, CAPILLARY - Abnormal; Notable for the following components:   Glucose-Capillary 162 (*)    All  other components within normal limits  GLUCOSE, CAPILLARY - Abnormal; Notable for the following components:   Glucose-Capillary 132 (*)    All other components within normal limits  PROTIME-INR  T4, FREE  TSH  HIV ANTIBODY (ROUTINE TESTING W REFLEX)  GLUCOSE, CAPILLARY  GLUCOSE, CAPILLARY  OCCULT BLOOD X 1 CARD TO LAB, STOOL  FERRITIN  GLUCOSE, CAPILLARY    Lab work reviewed by me shows slightly elevated beta natruretic peptide consistent with pulmonary edema.  Renal function is worsening __________________________________________  EKG  ED ECG REPORT I, Darel Hong, the attending physician, personally viewed and interpreted this ECG.  Date: 05/24/2018 EKG Time:  Rate: Around 80 or 90 Rhythm: normal sinus rhythm QRS Axis: normal Intervals: Leftward axis Narrative Interpretation: Significant amount of artifact makes interpretation nearly impossible.  He has a ventricular paced rhythm around 80 or 90 beats with expected repolarization abnormalities and no obvious signs of ischemia  ____________________________________________  RADIOLOGY  Chest x-ray reviewed by me shows cardiomegaly although no obvious pulmonary edema ____________________________________________   PROCEDURES  Procedure(s) performed: no  Procedures  Critical Care performed: no  ____________________________________________   INITIAL IMPRESSION / ASSESSMENT AND PLAN / ED COURSE  Pertinent labs & imaging results that were available during my care of the patient were reviewed  by me and considered in my medical decision making (see chart for details).   As part of my medical decision making, I reviewed the following data within the [place] History obtained from family if available, nursing notes, old chart and ekg, as well as notes from prior ED visits.  Patient comes to the emergency department with diffuse anasarca and worsening shortness of breath as well as diarrhea.   Differential is broad but includes worsening renal function, pulmonary edema, and any number of metabolic derangements.  He is afebrile.  Lab work is pending at this time.  The patient's renal function has significantly worsened raising concern for cardiorenal edema.  I given him 80 mg of IV furosemide and he will require inpatient admission for continued diuresis and management of his acute on chronic pulmonary edema, anasarca, and for wound care.      ____________________________________________   FINAL CLINICAL IMPRESSION(S) / ED DIAGNOSES  Final diagnoses:  Anasarca  Acute renal failure, unspecified acute renal failure type ([place])      NEW MEDICATIONS STARTED DURING THIS VISIT:  Current Discharge Medication List       Note:  This document was prepared using Dragon voice recognition software and may include unintentional dictation errors.    Darel Hong, MD 05/27/18 [phone]

## 2018-05-24 NOTE — ED Triage Notes (Signed)
Pt arrives to ED from home via [facility] with c/c of weakness leading to a fall. Pt fell from wheel chair when trying to go to bathroom and soiled himself as he lay on the floor. Pt states he was on floor for aprox 2 hours. Denies hitting head or LoC.

## 2018-05-25 ENCOUNTER — Emergency Department: Payer: Medicare Other

## 2018-05-25 ENCOUNTER — Other Ambulatory Visit: Payer: Medicare Other

## 2018-05-25 ENCOUNTER — Other Ambulatory Visit: Payer: Self-pay

## 2018-05-25 ENCOUNTER — Inpatient Hospital Stay: Payer: Medicare Other

## 2018-05-25 ENCOUNTER — Inpatient Hospital Stay (HOSPITAL_COMMUNITY)
Admit: 2018-05-25 | Discharge: 2018-05-25 | Disposition: A | Discharge status: Home / Self Care | Payer: Medicare Other | Attending: Internal Medicine | Admitting: Internal Medicine

## 2018-05-25 DIAGNOSIS — Z8673 Personal history of transient ischemic attack (TIA), and cerebral infarction without residual deficits: Secondary | ICD-10-CM | POA: Diagnosis not present

## 2018-05-25 DIAGNOSIS — I4891 Unspecified atrial fibrillation: Secondary | ICD-10-CM | POA: Diagnosis present

## 2018-05-25 DIAGNOSIS — E1151 Type 2 diabetes mellitus with diabetic peripheral angiopathy without gangrene: Secondary | ICD-10-CM | POA: Diagnosis present

## 2018-05-25 DIAGNOSIS — M109 Gout, unspecified: Secondary | ICD-10-CM | POA: Diagnosis not present

## 2018-05-25 DIAGNOSIS — E11649 Type 2 diabetes mellitus with hypoglycemia without coma: Secondary | ICD-10-CM | POA: Diagnosis present

## 2018-05-25 DIAGNOSIS — Z9049 Acquired absence of other specified parts of digestive tract: Secondary | ICD-10-CM | POA: Diagnosis not present

## 2018-05-25 DIAGNOSIS — R601 Generalized edema: Secondary | ICD-10-CM | POA: Diagnosis present

## 2018-05-25 DIAGNOSIS — L89620 Pressure ulcer of left heel, unstageable: Secondary | ICD-10-CM | POA: Diagnosis present

## 2018-05-25 DIAGNOSIS — I361 Nonrheumatic tricuspid (valve) insufficiency: Secondary | ICD-10-CM | POA: Diagnosis not present

## 2018-05-25 DIAGNOSIS — E11621 Type 2 diabetes mellitus with foot ulcer: Secondary | ICD-10-CM | POA: Diagnosis present

## 2018-05-25 DIAGNOSIS — I252 Old myocardial infarction: Secondary | ICD-10-CM | POA: Diagnosis not present

## 2018-05-25 DIAGNOSIS — N183 Chronic kidney disease, stage 3 (moderate): Secondary | ICD-10-CM | POA: Diagnosis not present

## 2018-05-25 DIAGNOSIS — L8990 Pressure ulcer of unspecified site, unspecified stage: Secondary | ICD-10-CM

## 2018-05-25 DIAGNOSIS — E1140 Type 2 diabetes mellitus with diabetic neuropathy, unspecified: Secondary | ICD-10-CM | POA: Diagnosis present

## 2018-05-25 DIAGNOSIS — R600 Localized edema: Secondary | ICD-10-CM | POA: Diagnosis not present

## 2018-05-25 DIAGNOSIS — E1122 Type 2 diabetes mellitus with diabetic chronic kidney disease: Secondary | ICD-10-CM | POA: Diagnosis present

## 2018-05-25 DIAGNOSIS — M5137 Other intervertebral disc degeneration, lumbosacral region: Secondary | ICD-10-CM | POA: Diagnosis present

## 2018-05-25 DIAGNOSIS — D631 Anemia in chronic kidney disease: Secondary | ICD-10-CM | POA: Diagnosis present

## 2018-05-25 DIAGNOSIS — L89610 Pressure ulcer of right heel, unstageable: Secondary | ICD-10-CM | POA: Diagnosis present

## 2018-05-25 DIAGNOSIS — Z8349 Family history of other endocrine, nutritional and metabolic diseases: Secondary | ICD-10-CM | POA: Diagnosis not present

## 2018-05-25 DIAGNOSIS — L89619 Pressure ulcer of right heel, unspecified stage: Secondary | ICD-10-CM | POA: Diagnosis not present

## 2018-05-25 DIAGNOSIS — I10 Essential (primary) hypertension: Secondary | ICD-10-CM | POA: Diagnosis not present

## 2018-05-25 DIAGNOSIS — E1165 Type 2 diabetes mellitus with hyperglycemia: Secondary | ICD-10-CM | POA: Diagnosis present

## 2018-05-25 DIAGNOSIS — Z951 Presence of aortocoronary bypass graft: Secondary | ICD-10-CM | POA: Diagnosis not present

## 2018-05-25 DIAGNOSIS — W050XXA Fall from non-moving wheelchair, initial encounter: Secondary | ICD-10-CM | POA: Diagnosis present

## 2018-05-25 DIAGNOSIS — I130 Hypertensive heart and chronic kidney disease with heart failure and stage 1 through stage 4 chronic kidney disease, or unspecified chronic kidney disease: Secondary | ICD-10-CM | POA: Diagnosis present

## 2018-05-25 DIAGNOSIS — I340 Nonrheumatic mitral (valve) insufficiency: Secondary | ICD-10-CM

## 2018-05-25 DIAGNOSIS — Z79899 Other long term (current) drug therapy: Secondary | ICD-10-CM | POA: Diagnosis not present

## 2018-05-25 DIAGNOSIS — I5023 Acute on chronic systolic (congestive) heart failure: Secondary | ICD-10-CM | POA: Diagnosis present

## 2018-05-25 DIAGNOSIS — I2510 Atherosclerotic heart disease of native coronary artery without angina pectoris: Secondary | ICD-10-CM | POA: Diagnosis present

## 2018-05-25 DIAGNOSIS — R0602 Shortness of breath: Secondary | ICD-10-CM | POA: Diagnosis not present

## 2018-05-25 DIAGNOSIS — M1A9XX0 Chronic gout, unspecified, without tophus (tophi): Secondary | ICD-10-CM | POA: Diagnosis present

## 2018-05-25 DIAGNOSIS — I255 Ischemic cardiomyopathy: Secondary | ICD-10-CM | POA: Diagnosis present

## 2018-05-25 DIAGNOSIS — Z89421 Acquired absence of other right toe(s): Secondary | ICD-10-CM | POA: Diagnosis not present

## 2018-05-25 DIAGNOSIS — N184 Chronic kidney disease, stage 4 (severe): Secondary | ICD-10-CM | POA: Diagnosis present

## 2018-05-25 DIAGNOSIS — R531 Weakness: Secondary | ICD-10-CM | POA: Diagnosis not present

## 2018-05-25 LAB — CBC WITH DIFFERENTIAL/PLATELET
ABS IMMATURE GRANULOCYTES: 0.16 10*3/uL — AB (ref 0.00–0.07)
Basophils Absolute: 0 10*3/uL (ref 0.0–0.1)
Basophils Relative: 0 %
Eosinophils Absolute: 0 10*3/uL (ref 0.0–0.5)
Eosinophils Relative: 0 %
HEMATOCRIT: 32.6 % — AB (ref 39.0–52.0)
Hemoglobin: 9.8 g/dL — ABNORMAL LOW (ref 13.0–17.0)
IMMATURE GRANULOCYTES: 1 %
LYMPHS ABS: 0.5 10*3/uL — AB (ref 0.7–4.0)
Lymphocytes Relative: 3 %
MCH: 25.6 pg — ABNORMAL LOW (ref 26.0–34.0)
MCHC: 30.1 g/dL (ref 30.0–36.0)
MCV: 85.1 fL (ref 80.0–100.0)
MONOS PCT: 5 %
Monocytes Absolute: 0.9 10*3/uL (ref 0.1–1.0)
NEUTROS ABS: 16.2 10*3/uL — AB (ref 1.7–7.7)
NEUTROS PCT: 91 %
NRBC: 0 % (ref 0.0–0.2)
Platelets: 356 10*3/uL (ref 150–400)
RBC: 3.83 MIL/uL — ABNORMAL LOW (ref 4.22–5.81)
RDW: 18 % — ABNORMAL HIGH (ref 11.5–15.5)
WBC: 17.8 10*3/uL — AB (ref 4.0–10.5)

## 2018-05-25 LAB — URINALYSIS, COMPLETE (UACMP) WITH MICROSCOPIC
BACTERIA UA: NONE SEEN
BILIRUBIN URINE: NEGATIVE
Glucose, UA: 50 mg/dL — AB
Hgb urine dipstick: NEGATIVE
KETONES UR: NEGATIVE mg/dL
LEUKOCYTES UA: NEGATIVE
Nitrite: NEGATIVE
Protein, ur: NEGATIVE mg/dL
Specific Gravity, Urine: 1.013 (ref 1.005–1.030)
pH: 5 (ref 5.0–8.0)

## 2018-05-25 LAB — COMPREHENSIVE METABOLIC PANEL
ALT: 33 U/L (ref 0–44)
AST: 44 U/L — ABNORMAL HIGH (ref 15–41)
Albumin: 3.1 g/dL — ABNORMAL LOW (ref 3.5–5.0)
Alkaline Phosphatase: 189 U/L — ABNORMAL HIGH (ref 38–126)
Anion gap: 12 (ref 5–15)
BUN: 80 mg/dL — ABNORMAL HIGH (ref 6–20)
CHLORIDE: 102 mmol/L (ref 98–111)
CO2: 23 mmol/L (ref 22–32)
CREATININE: 2.81 mg/dL — AB (ref 0.61–1.24)
Calcium: 8.7 mg/dL — ABNORMAL LOW (ref 8.9–10.3)
GFR calc Af Amer: 28 mL/min — ABNORMAL LOW (ref >60)
GFR calc non Af Amer: 25 mL/min — ABNORMAL LOW (ref >60)
Glucose, Bld: 398 mg/dL — ABNORMAL HIGH (ref 70–99)
Potassium: 3.7 mmol/L (ref 3.5–5.1)
SODIUM: 137 mmol/L (ref 135–145)
Total Bilirubin: 1.2 mg/dL (ref 0.3–1.2)
Total Protein: 7.5 g/dL (ref 6.5–8.1)

## 2018-05-25 LAB — CBC
HCT: 28.6 % — ABNORMAL LOW (ref 39.0–52.0)
Hemoglobin: 8.6 g/dL — ABNORMAL LOW (ref 13.0–17.0)
MCH: 25.8 pg — ABNORMAL LOW (ref 26.0–34.0)
MCHC: 30.1 g/dL (ref 30.0–36.0)
MCV: 85.9 fL (ref 80.0–100.0)
Platelets: 377 10*3/uL (ref 150–400)
RBC: 3.33 MIL/uL — ABNORMAL LOW (ref 4.22–5.81)
RDW: 18 % — ABNORMAL HIGH (ref 11.5–15.5)
WBC: 17.6 10*3/uL — AB (ref 4.0–10.5)
nRBC: 0 % (ref 0.0–0.2)

## 2018-05-25 LAB — PROTIME-INR
INR: 1.18
PROTHROMBIN TIME: 14.9 s (ref 11.4–15.2)

## 2018-05-25 LAB — BASIC METABOLIC PANEL
ANION GAP: 10 (ref 5–15)
BUN: 79 mg/dL — ABNORMAL HIGH (ref 6–20)
CHLORIDE: 101 mmol/L (ref 98–111)
CO2: 24 mmol/L (ref 22–32)
Calcium: 8.4 mg/dL — ABNORMAL LOW (ref 8.9–10.3)
Creatinine, Ser: 2.57 mg/dL — ABNORMAL HIGH (ref 0.61–1.24)
GFR calc Af Amer: 32 mL/min — ABNORMAL LOW (ref >60)
GFR calc non Af Amer: 27 mL/min — ABNORMAL LOW (ref >60)
Glucose, Bld: 305 mg/dL — ABNORMAL HIGH (ref 70–99)
Potassium: 3.3 mmol/L — ABNORMAL LOW (ref 3.5–5.1)
Sodium: 135 mmol/L (ref 135–145)

## 2018-05-25 LAB — ECHOCARDIOGRAM COMPLETE
Height: 70 in
WEIGHTICAEL: 3745.6 [oz_av]

## 2018-05-25 LAB — GLUCOSE, CAPILLARY
GLUCOSE-CAPILLARY: 235 mg/dL — AB (ref 70–99)
Glucose-Capillary: 123 mg/dL — ABNORMAL HIGH (ref 70–99)
Glucose-Capillary: 82 mg/dL (ref 70–99)
Glucose-Capillary: 108 mg/dL — ABNORMAL HIGH (ref 70–99)

## 2018-05-25 LAB — TROPONIN I: TROPONIN I: 0.06 ng/mL — AB (ref <0.03)

## 2018-05-25 LAB — TSH: TSH: 3.835 u[IU]/mL (ref 0.350–4.500)

## 2018-05-25 LAB — BRAIN NATRIURETIC PEPTIDE: B Natriuretic Peptide: 129 pg/mL — ABNORMAL HIGH (ref 0.0–100.0)

## 2018-05-25 LAB — T4, FREE: Free T4: 1.72 ng/dL (ref 0.82–1.77)

## 2018-05-25 LAB — SEDIMENTATION RATE: Sed Rate: 47 mm/hr — ABNORMAL HIGH (ref 0–20)

## 2018-05-25 MED ORDER — ACETAMINOPHEN 325 MG PO TABS
650.0000 mg | ORAL_TABLET | Freq: Four times a day (QID) | ORAL | Status: DC | PRN
  Administered 2018-05-25: 18:00:00 650 mg via ORAL
  Filled 2018-05-25: qty 2

## 2018-05-25 MED ORDER — INSULIN GLARGINE 100 UNIT/ML ~~LOC~~ SOLN
15.0000 [IU] | Freq: Two times a day (BID) | SUBCUTANEOUS | Status: DC
  Administered 2018-05-25 – 2018-05-26 (×4): 15 [IU] via SUBCUTANEOUS
  Filled 2018-05-25 (×6): qty 0.15

## 2018-05-25 MED ORDER — SODIUM CHLORIDE 0.9 % IV SOLN
3.0000 g | Freq: Four times a day (QID) | INTRAVENOUS | Status: DC
  Administered 2018-05-25 – 2018-05-28 (×10): 3 g via INTRAVENOUS
  Filled 2018-05-25 (×16): qty 3

## 2018-05-25 MED ORDER — ACETAMINOPHEN 650 MG RE SUPP: 650.0000 mg | Freq: Four times a day (QID) | RECTAL | Status: DC | PRN

## 2018-05-25 MED ORDER — INSULIN GLARGINE 100 UNITS/ML SOLOSTAR PEN: 15.0000 [IU] | PEN_INJECTOR | Freq: Two times a day (BID) | SUBCUTANEOUS | Status: DC

## 2018-05-25 MED ORDER — SODIUM CHLORIDE 0.9 % IV SOLN
INTRAVENOUS | Status: DC | PRN
  Administered 2018-05-25 – 2018-05-28 (×6): 250 mL via INTRAVENOUS

## 2018-05-25 MED ORDER — COLLAGENASE 250 UNIT/GM EX OINT
TOPICAL_OINTMENT | Freq: Every day | CUTANEOUS | Status: DC
  Administered 2018-05-25 – 2018-05-27 (×3): via TOPICAL
  Administered 2018-05-28: 1 via TOPICAL
  Administered 2018-05-29: 12:00:00 via TOPICAL
  Filled 2018-05-25: qty 30

## 2018-05-25 MED ORDER — INSULIN ASPART 100 UNIT/ML ~~LOC~~ SOLN
0.0000 [IU] | Freq: Every day | SUBCUTANEOUS | Status: DC
  Administered 2018-05-27 – 2018-05-28 (×2): 3 [IU] via SUBCUTANEOUS
  Filled 2018-05-25 (×2): qty 1

## 2018-05-25 MED ORDER — SENNOSIDES-DOCUSATE SODIUM 8.6-50 MG PO TABS
2.0000 | ORAL_TABLET | Freq: Every day | ORAL | Status: DC
  Administered 2018-05-25: 2 via ORAL
  Filled 2018-05-25 (×2): qty 2

## 2018-05-25 MED ORDER — ALBUTEROL SULFATE (2.5 MG/3ML) 0.083% IN NEBU: 2.5000 mg | INHALATION_SOLUTION | RESPIRATORY_TRACT | Status: DC | PRN

## 2018-05-25 MED ORDER — RIVAROXABAN 15 MG PO TABS
15.0000 mg | ORAL_TABLET | Freq: Every day | ORAL | Status: DC
  Administered 2018-05-25 – 2018-05-27 (×3): 15 mg via ORAL
  Filled 2018-05-25 (×4): qty 1

## 2018-05-25 MED ORDER — SODIUM CHLORIDE 0.9% FLUSH
3.0000 mL | Freq: Two times a day (BID) | INTRAVENOUS | Status: DC
  Administered 2018-05-25 – 2018-05-29 (×9): 3 mL via INTRAVENOUS

## 2018-05-25 MED ORDER — ONDANSETRON HCL 4 MG PO TABS: 4.0000 mg | ORAL_TABLET | Freq: Four times a day (QID) | ORAL | Status: DC | PRN

## 2018-05-25 MED ORDER — CARVEDILOL 3.125 MG PO TABS
3.1250 mg | ORAL_TABLET | Freq: Two times a day (BID) | ORAL | Status: DC
  Administered 2018-05-25 – 2018-05-29 (×8): 3.125 mg via ORAL
  Filled 2018-05-25 (×8): qty 1

## 2018-05-25 MED ORDER — FUROSEMIDE 10 MG/ML IJ SOLN
80.0000 mg | Freq: Two times a day (BID) | INTRAMUSCULAR | Status: DC
  Administered 2018-05-25 – 2018-05-26 (×2): 80 mg via INTRAVENOUS
  Filled 2018-05-25 (×2): qty 8

## 2018-05-25 MED ORDER — VANCOMYCIN HCL 10 G IV SOLR
1250.0000 mg | INTRAVENOUS | Status: DC
  Administered 2018-05-27: 1250 mg via INTRAVENOUS
  Filled 2018-05-25 (×2): qty 1250

## 2018-05-25 MED ORDER — HEPARIN SODIUM (PORCINE) 5000 UNIT/ML IJ SOLN
5000.0000 [IU] | Freq: Three times a day (TID) | INTRAMUSCULAR | Status: DC
  Administered 2018-05-25: 5000 [IU] via SUBCUTANEOUS
  Filled 2018-05-25: qty 1

## 2018-05-25 MED ORDER — OXYCODONE-ACETAMINOPHEN 5-325 MG PO TABS
1.0000 | ORAL_TABLET | ORAL | Status: DC | PRN
  Administered 2018-05-26 – 2018-05-27 (×5): 1 via ORAL
  Filled 2018-05-25 (×6): qty 1

## 2018-05-25 MED ORDER — FUROSEMIDE 10 MG/ML IJ SOLN
80.0000 mg | Freq: Once | INTRAMUSCULAR | Status: AC
  Administered 2018-05-25: 80 mg via INTRAVENOUS
  Filled 2018-05-25: qty 8

## 2018-05-25 MED ORDER — GABAPENTIN 400 MG PO CAPS
400.0000 mg | ORAL_CAPSULE | Freq: Three times a day (TID) | ORAL | Status: DC
  Administered 2018-05-25 – 2018-05-29 (×13): 400 mg via ORAL
  Filled 2018-05-25 (×3): qty 1
  Filled 2018-05-25: qty 4
  Filled 2018-05-25: qty 1
  Filled 2018-05-25 (×2): qty 4
  Filled 2018-05-25: qty 1
  Filled 2018-05-25: qty 4
  Filled 2018-05-25: qty 1
  Filled 2018-05-25: qty 4
  Filled 2018-05-25: qty 1
  Filled 2018-05-25: qty 4
  Filled 2018-05-25 (×2): qty 1
  Filled 2018-05-25: qty 4
  Filled 2018-05-25: qty 1
  Filled 2018-05-25: qty 4
  Filled 2018-05-25 (×4): qty 1
  Filled 2018-05-25: qty 4
  Filled 2018-05-25: qty 1

## 2018-05-25 MED ORDER — ONDANSETRON HCL 4 MG/2ML IJ SOLN: 4.0000 mg | Freq: Four times a day (QID) | INTRAMUSCULAR | Status: DC | PRN

## 2018-05-25 MED ORDER — POTASSIUM CHLORIDE CRYS ER 20 MEQ PO TBCR
20.0000 meq | EXTENDED_RELEASE_TABLET | Freq: Every day | ORAL | Status: DC
  Administered 2018-05-25 – 2018-05-29 (×5): 20 meq via ORAL
  Filled 2018-05-25 (×5): qty 1

## 2018-05-25 MED ORDER — ASPIRIN EC 81 MG PO TBEC
81.0000 mg | DELAYED_RELEASE_TABLET | Freq: Every day | ORAL | Status: DC
  Administered 2018-05-25 – 2018-05-29 (×5): 81 mg via ORAL
  Filled 2018-05-25 (×5): qty 1

## 2018-05-25 MED ORDER — VANCOMYCIN HCL 10 G IV SOLR
2000.0000 mg | Freq: Once | INTRAVENOUS | Status: AC
  Administered 2018-05-25: 12:00:00 2000 mg via INTRAVENOUS
  Filled 2018-05-25: qty 2000

## 2018-05-25 MED ORDER — RIVAROXABAN 15 MG PO TABS
15.0000 mg | ORAL_TABLET | Freq: Every day | ORAL | Status: DC
  Filled 2018-05-25: qty 1

## 2018-05-25 MED ORDER — CARVEDILOL 25 MG PO TABS
12.5000 mg | ORAL_TABLET | Freq: Two times a day (BID) | ORAL | Status: DC
  Administered 2018-05-25: 12.5 mg via ORAL
  Filled 2018-05-25: qty 1

## 2018-05-25 MED ORDER — INSULIN ASPART 100 UNIT/ML ~~LOC~~ SOLN
0.0000 [IU] | Freq: Three times a day (TID) | SUBCUTANEOUS | Status: DC
  Administered 2018-05-25: 1 [IU] via SUBCUTANEOUS
  Administered 2018-05-25: 08:00:00 5 [IU] via SUBCUTANEOUS
  Administered 2018-05-27: 14:00:00 3 [IU] via SUBCUTANEOUS
  Administered 2018-05-27 – 2018-05-28 (×4): 5 [IU] via SUBCUTANEOUS
  Administered 2018-05-29 (×2): 3 [IU] via SUBCUTANEOUS
  Filled 2018-05-25 (×9): qty 1

## 2018-05-25 MED ORDER — POLYETHYLENE GLYCOL 3350 17 G PO PACK: 17.0000 g | PACK | Freq: Every day | ORAL | Status: DC | PRN

## 2018-05-25 NOTE — H&P (Signed)
[facility] [place] NAME: Paul [name]    MR#:  [id number]  DATE OF BIRTH:  09/08/1965  DATE OF ADMISSION:  05/24/2018  PRIMARY CARE PHYSICIAN: Henderson Baltimore, FNP   REQUESTING/REFERRING PHYSICIAN: Dr. Mable Paris  CHIEF COMPLAINT:   Chief Complaint  Patient presents with  . Weakness  . Fall    HISTORY OF PRESENT ILLNESS:  Paul [name]  is a 53 y.o. male with a known history of systolic CHF ejection fraction 25 to 30%, diabetes mellitus, hypertension, CKD stage III, chronic foot ulcers presents to the emergency room due to falling from his wheelchair while going to the bathroom due to weakness.  Here he has been found to have significant anasarca that he complained about.  He takes Lasix.  Drinks plenty of fluids.  Sees a nephrologist and cardiologist in [facility] associated with [facility]. His creatinine is 2.8.  Baseline creatinine around 2.8-3 reviewing his nephrologist lab work from [facility]. He has been given 80 mg IV Lasix 1 time and is being admitted to the hospitalist service. He does have chronic foot ulcers that he thinks are improving.  PAST MEDICAL HISTORY:   Past Medical History:  Diagnosis Date  . Anemia   . Cardiac defibrillator in place    a. Biotronik LUmax 540 DRT, (ser # [id number]).  . Carotid arterial disease ([place])    a. s/p prior LICA stenting;  b. 10/2261 Carotid U/S: 40-59% bilat ICA stenosis. Patent LICA stent.  . CKD (chronic kidney disease), stage III ([place])   . Coronary artery disease    a. 2010 s/p CABG x 3.  . DDD (degenerative disc disease), lumbosacral    L5-S1  . Diabetes ([place])    Lantus at bedtime  . Gangrene of toe of right foot ([place])   . Gout of left hand 10/06/2016  . HFrEF (heart failure with reduced ejection fraction) ([place])    a. 07/2016 Echo: EF 25-30%, diff HK, mild MR, mildly dil LA, mod reduced RV fxn, PASP 73mmHg.  Marland Kitchen Hypertension    takes Coreg daily   . Ischemic cardiomyopathy    a. 07/2016 Echo: EF 25-30%, diff HK.  . Myocardial infarction ([place]) 2009  . Peripheral vascular disease ([place])   . Sleep apnea    sleep study yr ago-unable to afford cpap  . Stroke [facility]) 09   no weakness    PAST SURGICAL HISTORY:   Past Surgical History:  Procedure Laterality Date  . AMPUTATION TOE Right 08/22/2016   Procedure: AMPUTATION TOE;  Surgeon: Samara Deist, DPM;  Location: [facility] ORS;  Service: Podiatry;  Laterality: Right;  . AMPUTATION TOE Right 10/09/2016   Procedure: AMPUTATION TOE-RIGHT 2ND MPJ;  Surgeon: Samara Deist, DPM;  Location: [facility] ORS;  Service: Podiatry;  Laterality: Right;  . APLIGRAFT PLACEMENT Right 10/09/2016   Procedure: APLIGRAFT PLACEMENT;  Surgeon: Samara Deist, DPM;  Location: [facility] ORS;  Service: Podiatry;  Laterality: Right;  . CARDIAC DEFIBRILLATOR PLACEMENT  2011  . CAROTID ENDARTERECTOMY Left   . CHOLECYSTECTOMY  2010  . CORONARY ARTERY BYPASS GRAFT  2010   CABG x 3   . GRAFT APPLICATION Right 3/35/4562   Procedure: FULL THICKNESS SKIN GRAFT-RIGHT FOOT;  Surgeon: Algernon Huxley, MD;  Location: [facility] ORS;  Service: Vascular;  Laterality: Right;  . HIP SURGERY Right 1994  . INCISION AND DRAINAGE OF WOUND Right 08/22/2016   Procedure: IRRIGATION AND DEBRIDEMENT WOUND and wound vac placement;  Surgeon: Samara Deist, DPM;  Location: [facility] ORS;  Service: Podiatry;  Laterality: Right;  . LOWER EXTREMITY ANGIOGRAPHY Right 08/24/2016   Procedure: Lower Extremity Angiography;  Surgeon: Algernon Huxley, MD;  Location: [place] [facility];  Service: Cardiovascular;  Laterality: Right;  . MASS EXCISION Right 07/18/2014   Procedure: EXCISION HETEROTOPIC BONE RIGHT HIP;  Surgeon: Frederik Pear, MD;  Location: [facility];  Service: Orthopedics;  Laterality: Right;  . Open Heart Surgery  2010   x 3  . WOUND DEBRIDEMENT Right 10/09/2016   Procedure: DEBRIDEMENT WOUND;  Surgeon: Samara Deist, DPM;  Location: [facility] ORS;  Service: Podiatry;  Laterality:  Right;    SOCIAL HISTORY:   Social History   Tobacco Use  . Smoking status: Never Smoker  . Smokeless tobacco: Never Used  Substance Use Topics  . Alcohol use: No    FAMILY HISTORY:   Family History  Problem Relation Age of Onset  . Hypertension Other   . Diabetes Other   . Diabetes Father   . Hyperlipidemia Father   . Hypertension Father   . Hyperlipidemia Sister   . Hypertension Sister   . Diabetes Sister   . Diabetes Brother   . Hypertension Brother   . Hyperlipidemia Brother     DRUG ALLERGIES:   Allergies  Allergen Reactions  . Ibuprofen Other (See Comments)    Heart problems  . Baclofen Other (See Comments)  . Metformin Diarrhea  . Nsaids     Due to kidney and heart problems per pt    REVIEW OF SYSTEMS:   Review of Systems  Constitutional: Positive for malaise/fatigue. Negative for chills and fever.  HENT: Negative for sore throat.   Eyes: Negative for blurred vision, double vision and pain.  Respiratory: Negative for cough, hemoptysis, shortness of breath and wheezing.   Cardiovascular: Positive for leg swelling. Negative for chest pain, palpitations and orthopnea.  Gastrointestinal: Negative for abdominal pain, constipation, diarrhea, heartburn, nausea and vomiting.  Genitourinary: Negative for dysuria and hematuria.  Musculoskeletal: Negative for back pain and joint pain.  Skin: Negative for rash.  Neurological: Negative for sensory change, speech change, focal weakness and headaches.  Endo/Heme/Allergies: Does not bruise/bleed easily.  Psychiatric/Behavioral: Negative for depression. The patient is not nervous/anxious.     MEDICATIONS AT HOME:   Prior to Admission medications   Medication Sig Start Date End Date Taking? Authorizing Provider  aspirin EC 81 MG tablet Take 1 tablet (81 mg total) by mouth daily. 10/21/16  Yes Theora Gianotti, NP  carvedilol (COREG) 12.5 MG tablet Take 12.5 mg by mouth 2 (two) times daily.    Yes  [provider]  diphenhydramine-acetaminophen (TYLENOL PM EXTRA STRENGTH) 25-500 MG TABS tablet Take 2 tablets by mouth at bedtime as needed (mild pain).   Yes [provider]  gabapentin (NEURONTIN) 400 MG capsule Take 400 mg by mouth 3 (three) times daily.  03/03/18  Yes [provider]  insulin glargine (LANTUS) 100 unit/mL SOPN Inject 15 Units into the skin 2 (two) times daily.    Yes [provider]  oxyCODONE-acetaminophen (PERCOCET/ROXICET) 5-325 MG tablet Take 1 tablet by mouth every 4 (four) hours as needed for severe pain. 01/05/18  Yes Tegeler, Gwenyth Allegra, MD  Rivaroxaban (XARELTO) 15 MG TABS tablet Take 15 mg by mouth daily with supper.    Yes [provider]  senna-docusate (SENOKOT-S) 8.6-50 MG tablet Take 2 tablets by mouth at bedtime.    Yes [provider]  VITAL SIGNS:  Blood pressure 112/65, pulse 89, temperature 97.9 F (36.6 C), temperature source Oral, resp. rate (!) 24, height 5\' 10"  (1.778 m), weight 104.3 kg, SpO2 100 %.  PHYSICAL EXAMINATION:  Physical Exam  GENERAL:  53 y.o.-year-old patient lying in the bed with no acute distress.  EYES: Pupils equal, round, reactive to light and accommodation. No scleral icterus. Extraocular muscles intact.  HEENT: Head atraumatic, normocephalic. Oropharynx and nasopharynx clear. No oropharyngeal erythema, moist oral mucosa  NECK:  Supple, no jugular venous distention. No thyroid enlargement, no tenderness.  LUNGS: Normal breath sounds bilaterally, no wheezing, rales, rhonchi. No use of accessory muscles of respiration.  CARDIOVASCULAR: S1, S2 normal. No murmurs, rubs, or gallops.  ABDOMEN: Soft, nontender, nondistended. Bowel sounds present. No organomegaly or mass.  EXTREMITIES: No cyanosis, or clubbing. + 2 pedal & radial pulses b/l.  Edema up to the knees NEUROLOGIC: Cranial nerves II through XII are intact.  Motor strength 4/5 in lower extremities PSYCHIATRIC: The  patient is alert and oriented x 3. Good affect.  SKIN: No obvious rash, lesion, or ulcer.  Dressing over bilateral feet  LABORATORY PANEL:   CBC Recent Labs  Lab 05/25/18 0036  WBC 17.8*  HGB 9.8*  HCT 32.6*  PLT 356   ------------------------------------------------------------------------------------------------------------------  Chemistries  Recent Labs  Lab 05/25/18 0134  NA 137  K 3.7  CL 102  CO2 23  GLUCOSE 398*  BUN 80*  CREATININE 2.81*  CALCIUM 8.7*  AST 44*  ALT 33  ALKPHOS 189*  BILITOT 1.2   ------------------------------------------------------------------------------------------------------------------  Cardiac Enzymes Recent Labs  Lab 05/25/18 0134  TROPONINI 0.06*   ------------------------------------------------------------------------------------------------------------------  RADIOLOGY:  Dg Chest Port 1 View  Result Date: 05/25/2018 CLINICAL DATA:  Shortness of breath EXAM: PORTABLE CHEST 1 VIEW COMPARISON:  03/03/2018 FINDINGS: Post sternotomy changes. Left-sided pacing device as before. Cardiomegaly. No consolidation or effusion. No pneumothorax. IMPRESSION: Cardiomegaly without overt pulmonary edema. Electronically Signed   By: Donavan Foil M.D.   On: 05/25/2018 01:06     IMPRESSION AND PLAN:   *Acute on chronic systolic congestive heart failure with anasarca.  Start IV Lasix 80 mg twice daily.  Monitor input and output.  Repeat BMP in the morning.  Replace potassium as needed Check echocardiogram  *CKD stage IV.  Creatinine at baseline.  Monitor while being diuresed.  Baseline creatinine 2.8  *Diabetes mellitus, uncontrolled with hyperglycemia.  Start home dose of insulin.  Sliding scale insulin added.  Check hemoglobin A1c.  Diabetic diet.  Increase Lantus as needed.  *Chronic foot ulcers.  Wound care consult placed.  DVT prophylaxis with heparin  All the records are reviewed and case discussed with ED provider. Management  plans discussed with the patient, family and they are in agreement.  CODE STATUS: Full code  TOTAL TIME TAKING CARE OF THIS PATIENT: 40 minutes.   Leia Alf Homar Weinkauf M.D on 05/25/2018 at 3:57 AM  Between 7am to 6pm - Pager - [phone]  After 6pm go to [email or web address] - password [email or web address] [place]  Office  [phone]  CC: Primary care physician; Henderson Baltimore, FNP  Note: This dictation was prepared with Dragon dictation along with smaller phrase technology. Any transcriptional errors that result from this process are unintentional.

## 2018-05-25 NOTE — ED Notes (Signed)
ED TO INPATIENT HANDOFF REPORT  Name/Age/Gender Paul [name]. 53 y.o. male  Code Status    Code Status Orders  (From admission, onward)         Start     Ordered   05/25/18 0352  Full code  Continuous     05/25/18 0352        Code Status History    Date Active Date Inactive Code Status Order ID Comments User Context   10/09/2016 1003 10/09/2016 1340 Full Code [id number]  Samara Deist, [facility] Inpatient   08/22/2016 0042 08/27/2016 1649 Full Code [id number]  Lance Coon, MD Inpatient   07/18/2014 1818 07/20/2014 2002 Full Code [id number]  Leighton Parody PA-C Inpatient      Home/SNF/Other Home  Chief Complaint Weakness  Level of Care/Admitting Diagnosis ED Disposition    ED Disposition Condition [place]: [facility] [[id number]]  Level of Care: Med-Surg [16]  Diagnosis: Anasarca [144818]  Admitting Physician: Hillary Bow [[id number]]  Attending Physician: Hillary Bow [[id number]]  Estimated length of stay: past midnight tomorrow  Certification:: I certify this patient will need inpatient services for at least 2 midnights  PT Class (Do Not Modify): Inpatient [101]  PT Acc Code (Do Not Modify): Private [1]       Medical History Past Medical History:  Diagnosis Date  . Anemia   . Cardiac defibrillator in place    a. Biotronik LUmax 540 DRT, (ser # [id number]).  . Carotid arterial disease ([place])    a. s/p prior LICA stenting;  b. 11/4126 Carotid U/S: 40-59% bilat ICA stenosis. Patent LICA stent.  . CKD (chronic kidney disease), stage III ([place])   . Coronary artery disease    a. 2010 s/p CABG x 3.  . DDD (degenerative disc disease), lumbosacral    L5-S1  . Diabetes ([place])    Lantus at bedtime  . Gangrene of toe of right foot ([place])   . Gout of left hand 10/06/2016  . HFrEF (heart failure with reduced ejection fraction) ([place])    a. 07/2016 Echo: EF 25-30%, diff HK, mild MR, mildly dil LA, mod reduced RV fxn, PASP 69mmHg.  Marland Kitchen  Hypertension    takes Coreg daily  . Ischemic cardiomyopathy    a. 07/2016 Echo: EF 25-30%, diff HK.  . Myocardial infarction ([place]) 2009  . Peripheral vascular disease ([place])   . Sleep apnea    sleep study yr ago-unable to afford cpap  . Stroke [facility]) 09   no weakness    Allergies Allergies  Allergen Reactions  . Ibuprofen Other (See Comments)    Heart problems  . Baclofen Other (See Comments)  . Metformin Diarrhea  . Nsaids     Due to kidney and heart problems per pt    IV Location/Drains/Wounds Patient Lines/Drains/Airways Status   Active Line/Drains/Airways    Name:   Placement date:   Placement time:   Site:   Days:   Peripheral IV 05/25/18 Left Hand   05/25/18    0041    Hand   less than 1   Sheath 08/24/16 Left Arterial;Femoral   08/24/16    1533    Arterial;Femoral   639   Negative Pressure Wound Therapy Foot Right   08/22/16    1704    -   641   Incision (Closed) 08/22/16 Foot Right   08/22/16    1701     641   Incision (Closed) 10/09/16 Foot Right  10/09/16    0849     593   Incision (Closed) 12/16/16 Foot Right   12/16/16    1253     525   Incision (Closed) 12/16/16 Leg Right   12/16/16    1253     525   Wound / Incision (Open or Dehisced) 08/22/16 Other (Comment) Right purlent drainage, blackend   08/22/16    0221    -   641          Labs/Imaging Results for orders placed or performed during the hospital encounter of 05/24/18 (from the past 48 hour(s))  CBC with Differential     Status: Abnormal   Collection Time: 05/25/18 12:36 AM  Result Value Ref Range   WBC 17.8 (H) 4.0 - 10.5 K/uL   RBC 3.83 (L) 4.22 - 5.81 MIL/uL   Hemoglobin 9.8 (L) 13.0 - 17.0 g/dL   HCT 32.6 (L) 39.0 - 52.0 %   MCV 85.1 80.0 - 100.0 fL   MCH 25.6 (L) 26.0 - 34.0 pg   MCHC 30.1 30.0 - 36.0 g/dL   RDW 18.0 (H) 11.5 - 15.5 %   Platelets 356 150 - 400 K/uL   nRBC 0.0 0.0 - 0.2 %   Neutrophils Relative % 91 %   Neutro Abs 16.2 (H) 1.7 - 7.7 K/uL   Lymphocytes Relative 3 %    Lymphs Abs 0.5 (L) 0.7 - 4.0 K/uL   Monocytes Relative 5 %   Monocytes Absolute 0.9 0.1 - 1.0 K/uL   Eosinophils Relative 0 %   Eosinophils Absolute 0.0 0.0 - 0.5 K/uL   Basophils Relative 0 %   Basophils Absolute 0.0 0.0 - 0.1 K/uL   Immature Granulocytes 1 %   Abs Immature Granulocytes 0.16 (H) 0.00 - 0.07 K/uL    Comment: Performed at [facility], [address] [place], [place] [address]  Protime-INR     Status: None   Collection Time: 05/25/18 12:36 AM  Result Value Ref Range   Prothrombin Time 14.9 11.4 - 15.2 seconds   INR 1.18     Comment: Performed at [facility], [address] [place], [place] [address]  Brain natriuretic peptide     Status: Abnormal   Collection Time: 05/25/18 12:38 AM  Result Value Ref Range   B Natriuretic Peptide 129.0 (H) 0.0 - 100.0 pg/mL    Comment: Performed at [facility], [address] [place], [place] [address]  Urinalysis, Complete w Microscopic     Status: Abnormal   Collection Time: 05/25/18 12:49 AM  Result Value Ref Range   Color, Urine YELLOW (A) YELLOW   APPearance CLEAR (A) CLEAR   Specific Gravity, Urine 1.013 1.005 - 1.030   pH 5.0 5.0 - 8.0   Glucose, UA 50 (A) NEGATIVE mg/dL   Hgb urine dipstick NEGATIVE NEGATIVE   Bilirubin Urine NEGATIVE NEGATIVE   Ketones, ur NEGATIVE NEGATIVE mg/dL   Protein, ur NEGATIVE NEGATIVE mg/dL   Nitrite NEGATIVE NEGATIVE   Leukocytes, UA NEGATIVE NEGATIVE   RBC / HPF 0-5 0 - 5 RBC/hpf   WBC, UA 0-5 0 - 5 WBC/hpf   Bacteria, UA NONE SEEN NONE SEEN   Squamous Epithelial / LPF 0-5 0 - 5   Hyaline Casts, UA PRESENT     Comment: Performed at [facility], [address], [place], [place] [address]  Comprehensive metabolic panel     Status: Abnormal   Collection Time: 05/25/18  1:34 AM  Result Value Ref Range   Sodium 137 135 - 145 mmol/L   Potassium 3.7 3.5 - 5.1 mmol/L   Chloride 102 98 - 111 mmol/L   CO2 23 22 - 32 mmol/L   Glucose, Bld 398 (H)  70 - 99 mg/dL   BUN 80 (H) 6 - 20 mg/dL   Creatinine, Ser 2.81 (H) 0.61 - 1.24 mg/dL   Calcium 8.7 (L) 8.9 - 10.3 mg/dL   Total Protein 7.5 6.5 - 8.1 g/dL   Albumin 3.1 (L) 3.5 - 5.0 g/dL   AST 44 (H) 15 - 41 U/L   ALT 33 0 - 44 U/L   Alkaline Phosphatase 189 (H) 38 - 126 U/L   Total Bilirubin 1.2 0.3 - 1.2 mg/dL   GFR calc non Af Amer 25 (L) >60 mL/min   GFR calc Af Amer 28 (L) >60 mL/min   Anion gap 12 5 - 15    Comment: Performed at [facility], [address] [place], [place] [address]  T4, free     Status: None   Collection Time: 05/25/18  1:34 AM  Result Value Ref Range   Free T4 1.72 0.82 - 1.77 ng/dL    Comment: (NOTE) Biotin ingestion may interfere with free T4 tests. If the results are inconsistent with the TSH level, previous test results, or the clinical presentation, then consider biotin interference. If needed, order repeat testing after stopping biotin. Performed at [facility], [address] [place], [place] [address]   Troponin I -     Status: Abnormal   Collection Time: 05/25/18  1:34 AM  Result Value Ref Range   Troponin I 0.06 (HH) <0.03 ng/mL    Comment: CRITICAL RESULT CALLED TO, READ BACK BY AND VERIFIED WITH TOM Medford Staheli @0233  05/25/18 AKT Performed at [facility], [address] [place], [place] [address]   TSH     Status: None   Collection Time: 05/25/18  1:34 AM  Result Value Ref Range   TSH 3.835 0.350 - 4.500 uIU/mL    Comment: Performed by a 3rd Generation assay with a functional sensitivity of <=0.01 uIU/mL. Performed at [facility], [address] [place], [place] [address]    Dg Chest Port 1 View  Result Date: 05/25/2018 CLINICAL DATA:  Shortness of breath EXAM: PORTABLE CHEST 1 VIEW COMPARISON:  03/03/2018 FINDINGS: Post sternotomy changes. Left-sided pacing device as before. Cardiomegaly. No consolidation or effusion. No pneumothorax. IMPRESSION: Cardiomegaly without overt pulmonary edema.  Electronically Signed   By: Donavan Foil M.D.   On: 05/25/2018 01:06    Pending Labs Unresulted Labs (From admission, onward)    Start     Ordered   05/25/18 7096  Basic metabolic panel  Tomorrow morning,   STAT     05/25/18 0352   05/25/18 0500  CBC  Tomorrow morning,   STAT     05/25/18 0352   05/25/18 0353  HIV antibody (Routine Testing)  Add-on,   AD     05/25/18 0352   05/25/18 0352  Hemoglobin A1c  Add-on,   AD    Comments:  To assess prior glycemic control    05/25/18 0352          Vitals/Pain Today's Vitals   05/24/18 2352 05/24/18 2353 05/24/18 2354 05/25/18 0238  BP: 137/80   112/65  Pulse: 94   89  Resp: 17   (!) 24  Temp: 97.9 F (36.6 C)     TempSrc: Oral  SpO2: 97%   100%  Weight:   104.3 kg   Height:   5\' 10"  (1.778 m)   PainSc:  7       Isolation Precautions No active isolations  Medications Medications  insulin aspart (novoLOG) injection 0-15 Units (has no administration in time range)  insulin aspart (novoLOG) injection 0-5 Units (has no administration in time range)  heparin injection 5,000 Units (has no administration in time range)  sodium chloride flush (NS) 0.9 % injection 3 mL (has no administration in time range)  acetaminophen (TYLENOL) tablet 650 mg (has no administration in time range)    Or  acetaminophen (TYLENOL) suppository 650 mg (has no administration in time range)  polyethylene glycol (MIRALAX / GLYCOLAX) packet 17 g (has no administration in time range)  ondansetron (ZOFRAN) tablet 4 mg (has no administration in time range)    Or  ondansetron (ZOFRAN) injection 4 mg (has no administration in time range)  albuterol (PROVENTIL) (2.5 MG/3ML) 0.083% nebulizer solution 2.5 mg (has no administration in time range)  furosemide (LASIX) injection 80 mg (has no administration in time range)  furosemide (LASIX) injection 80 mg (80 mg Intravenous Given 05/25/18 0340)    Mobility manual wheelchair

## 2018-05-25 NOTE — Consult Note (Signed)
[facility] Podiatry                                                      Patient Demographics  Paul [name], is a 53 y.o. male   MRN: [id number]   DOB - 1965-07-13  Admit Date - 05/24/2018    Outpatient Primary MD for the patient is Henderson Baltimore, FNP  Consult requested in the Hospital by Loletha Grayer, MD, On 05/25/2018    Reason for consult bilateral decubitus heel ulcers   With History of -  Past Medical History:  Diagnosis Date  . Anemia   . Cardiac defibrillator in place    a. Biotronik LUmax 540 DRT, (ser # [id number]).  . Carotid arterial disease ([place])    a. s/p prior LICA stenting;  b. 08/7652 Carotid U/S: 40-59% bilat ICA stenosis. Patent LICA stent.  . CKD (chronic kidney disease), stage III ([place])   . Coronary artery disease    a. 2010 s/p CABG x 3.  . DDD (degenerative disc disease), lumbosacral    L5-S1  . Diabetes ([place])    Lantus at bedtime  . Gangrene of toe of right foot ([place])   . Gout of left hand 10/06/2016  . HFrEF (heart failure with reduced ejection fraction) ([place])    a. 07/2016 Echo: EF 25-30%, diff HK, mild MR, mildly dil LA, mod reduced RV fxn, PASP 46mmHg.  Marland Kitchen Hypertension    takes Coreg daily  . Ischemic cardiomyopathy    a. 07/2016 Echo: EF 25-30%, diff HK.  . Myocardial infarction ([place]) 2009  . Peripheral vascular disease ([place])   . Sleep apnea    sleep study yr ago-unable to afford cpap  . Stroke [facility]) 09   no weakness      Past Surgical History:  Procedure Laterality Date  . AMPUTATION TOE Right 08/22/2016   Procedure: AMPUTATION TOE;  Surgeon: Samara Deist, DPM;  Location: [facility] ORS;  Service: Podiatry;  Laterality: Right;  . AMPUTATION TOE Right 10/09/2016   Procedure: AMPUTATION TOE-RIGHT 2ND MPJ;  Surgeon: Samara Deist, DPM;  Location: [facility] ORS;  Service: Podiatry;  Laterality: Right;  . APLIGRAFT PLACEMENT  Right 10/09/2016   Procedure: APLIGRAFT PLACEMENT;  Surgeon: Samara Deist, DPM;  Location: [facility] ORS;  Service: Podiatry;  Laterality: Right;  . CARDIAC DEFIBRILLATOR PLACEMENT  2011  . CAROTID ENDARTERECTOMY Left   . CHOLECYSTECTOMY  2010  . CORONARY ARTERY BYPASS GRAFT  2010   CABG x 3   . GRAFT APPLICATION Right 6/50/3546   Procedure: FULL THICKNESS SKIN GRAFT-RIGHT FOOT;  Surgeon: Algernon Huxley, MD;  Location: [facility] ORS;  Service: Vascular;  Laterality: Right;  . HIP SURGERY Right 1994  . INCISION AND DRAINAGE OF WOUND Right 08/22/2016   Procedure: IRRIGATION AND DEBRIDEMENT WOUND and wound vac placement;  Surgeon: Samara Deist, DPM;  Location: [facility] ORS;  Service: Podiatry;  Laterality: Right;  . LOWER EXTREMITY ANGIOGRAPHY Right 08/24/2016   Procedure: Lower Extremity Angiography;  Surgeon: Algernon Huxley, MD;  Location: [facility];  Service: Cardiovascular;  Laterality: Right;  . MASS EXCISION Right 07/18/2014   Procedure: EXCISION HETEROTOPIC BONE RIGHT HIP;  Surgeon: Frederik Pear, MD;  Location: [place];  Service: Orthopedics;  Laterality: Right;  . Open Heart Surgery  2010   x 3  . WOUND DEBRIDEMENT Right  10/09/2016   Procedure: DEBRIDEMENT WOUND;  Surgeon: Samara Deist, DPM;  Location: [facility] ORS;  Service: Podiatry;  Laterality: Right;    in for   Chief Complaint  Patient presents with  . Weakness  . Fall     HPI  Paul [name]  is a 53 y.o. male, patient was in rehab for quite a while following multiple medical problems.  This included some amputation of residual toes on his right foot.  While he is in rehab he lay on his back a lot in bed and developed pressure ulcers on both heels.  Patient is diabetic also.    Review of Systems    In addition to the HPI above,  No Fever-chills, No Headache, No changes with Vision or hearing, No problems swallowing food or Liquids, No Chest pain, Cough or Shortness of Breath, No Abdominal pain, No Nausea or Vommitting, Bowel  movements are regular, No Blood in stool or Urine, No dysuria, No new skin rashes or bruises, No new joints pains-aches,  No new weakness, tingling, numbness in any extremity, No recent weight gain or loss, No polyuria, polydypsia or polyphagia, No significant Mental Stressors.  A full 10 point Review of Systems was done, except as stated above, all other Review of Systems were negative.   Social History Social History   Tobacco Use  . Smoking status: Never Smoker  . Smokeless tobacco: Never Used  Substance Use Topics  . Alcohol use: No    Family History Family History  Problem Relation Age of Onset  . Hypertension Other   . Diabetes Other   . Diabetes Father   . Hyperlipidemia Father   . Hypertension Father   . Hyperlipidemia Sister   . Hypertension Sister   . Diabetes Sister   . Diabetes Brother   . Hypertension Brother   . Hyperlipidemia Brother     Prior to Admission medications   Medication Sig Start Date End Date Taking? Authorizing Provider  aspirin EC 81 MG tablet Take 1 tablet (81 mg total) by mouth daily. 10/21/16  Yes Theora Gianotti, NP  carvedilol (COREG) 12.5 MG tablet Take 12.5 mg by mouth 2 (two) times daily.    Yes [provider]  diphenhydramine-acetaminophen (TYLENOL PM EXTRA STRENGTH) 25-500 MG TABS tablet Take 2 tablets by mouth at bedtime as needed (mild pain).   Yes [provider]  gabapentin (NEURONTIN) 400 MG capsule Take 400 mg by mouth 3 (three) times daily.  03/03/18  Yes [provider]  insulin glargine (LANTUS) 100 unit/mL SOPN Inject 15 Units into the skin 2 (two) times daily.    Yes [provider]  oxyCODONE-acetaminophen (PERCOCET/ROXICET) 5-325 MG tablet Take 1 tablet by mouth every 4 (four) hours as needed for severe pain. 01/05/18  Yes Tegeler, Gwenyth Allegra, MD  Rivaroxaban (XARELTO) 15 MG TABS tablet Take 15 mg by mouth daily with supper.    Yes [provider]  senna-docusate  (SENOKOT-S) 8.6-50 MG tablet Take 2 tablets by mouth at bedtime.    Yes [provider]    Anti-infectives (From admission, onward)   Start     Dose/Rate Route Frequency Ordered Stop   05/27/18 0000  vancomycin (VANCOCIN) 1,250 mg in sodium chloride 0.9 % 250 mL IVPB     1,250 mg 166.7 mL/hr over 90 Minutes Intravenous Every 36 hours 05/25/18 1225     05/25/18 1200  Ampicillin-Sulbactam (UNASYN) 3 g in sodium chloride 0.9 % 100 mL IVPB  3 g 200 mL/hr over 30 Minutes Intravenous Every 6 hours 05/25/18 1044     05/25/18 1045  vancomycin (VANCOCIN) 2,000 mg in sodium chloride 0.9 % 500 mL IVPB     2,000 mg 250 mL/hr over 120 Minutes Intravenous  Once 05/25/18 1044        Scheduled Meds: . aspirin EC  81 mg Oral Daily  . carvedilol  12.5 mg Oral BID  . collagenase   Topical Daily  . furosemide  80 mg Intravenous BID  . gabapentin  400 mg Oral TID  . insulin aspart  0-15 Units Subcutaneous TID WC  . insulin aspart  0-5 Units Subcutaneous QHS  . insulin glargine  15 Units Subcutaneous BID  . potassium chloride  20 mEq Oral Daily  . senna-docusate  2 tablet Oral QHS  . sodium chloride flush  3 mL Intravenous Q12H   Continuous Infusions: . sodium chloride 250 mL (05/25/18 1208)  . ampicillin-sulbactam (UNASYN) IV    . [START ON 05/27/2018] vancomycin    . vancomycin 2,000 mg (05/25/18 1208)   PRN Meds:.sodium chloride, acetaminophen **OR** acetaminophen, albuterol, ondansetron **OR** ondansetron (ZOFRAN) IV, oxyCODONE-acetaminophen, polyethylene glycol  Allergies  Allergen Reactions  . Ibuprofen Other (See Comments)    Heart problems  . Baclofen Other (See Comments)  . Metformin Diarrhea  . Nsaids     Due to kidney and heart problems per pt    Physical Exam  Vitals  Blood pressure 109/62, pulse 83, temperature 98.6 F (37 C), temperature source Oral, resp. rate 17, height 5\' 10"  (1.778 m), weight 106.2 kg, SpO2 100 %.  Lower Extremity exam:  Vascular:  Diminished bilateral  Dermatological: Patient has decubitus ulcers to both heels.  On the left side it is approximately 2.9 cm x 2.8 cm with a black necrotic eschar superficially.  Uncertain as to the depth but x-rays did not show deep penetration to the bone and no involvement of the bone.  On the right foot the wound is larger approximately 4 and half by 4.2 cm with no significant necrotic eschar but some erythema and irritation to the region.  Really no evidence of cellulitis is no evidence of drainage to either wound with compression to the area.  No surrounding cellulitis or extreme redness to the region is noted.  Neurological: Likely some peripheral neuropathy but he does feel pain when I lift his legs.  Ortho: Transmit amputation right foot  Data Review  CBC Recent Labs  Lab 05/25/18 0036 05/25/18 0601  WBC 17.8* 17.6*  HGB 9.8* 8.6*  HCT 32.6* 28.6*  PLT 356 377  MCV 85.1 85.9  MCH 25.6* 25.8*  MCHC 30.1 30.1  RDW 18.0* 18.0*  LYMPHSABS 0.5*  --   MONOABS 0.9  --   EOSABS 0.0  --   BASOSABS 0.0  --    ------------------------------------------------------------------------------------------------------------------  Chemistries  Recent Labs  Lab 05/25/18 0134 05/25/18 0601  NA 137 135  K 3.7 3.3*  CL 102 101  CO2 23 24  GLUCOSE 398* 305*  BUN 80* 79*  CREATININE 2.81* 2.57*  CALCIUM 8.7* 8.4*  AST 44*  --   ALT 33  --   ALKPHOS 189*  --   BILITOT 1.2  --    ------------------------------------------------------------------------------------------------------------------ estimated creatinine clearance is 40.6 mL/min (A) (by C-G formula based on SCr of 2.57 mg/dL (H)). ------------------------------------------------------------------------------------------------------------------ Recent Labs    05/25/18 0134  TSH 3.835   Urinalysis    Component Value Date/Time   COLORURINE  YELLOW (A) 05/25/2018 0049   APPEARANCEUR CLEAR (A) 05/25/2018 0049    LABSPEC 1.013 05/25/2018 0049   PHURINE 5.0 05/25/2018 0049   GLUCOSEU 50 (A) 05/25/2018 0049   HGBUR NEGATIVE 05/25/2018 0049   BILIRUBINUR NEGATIVE 05/25/2018 [place] 05/25/2018 0049   PROTEINUR NEGATIVE 05/25/2018 0049   UROBILINOGEN 1.0 07/16/2014 1558   NITRITE NEGATIVE 05/25/2018 0049   LEUKOCYTESUR NEGATIVE 05/25/2018 0049     Imaging results:   Dg Chest Port 1 View  Result Date: 05/25/2018 CLINICAL DATA:  Shortness of breath EXAM: PORTABLE CHEST 1 VIEW COMPARISON:  03/03/2018 FINDINGS: Post sternotomy changes. Left-sided pacing device as before. Cardiomegaly. No consolidation or effusion. No pneumothorax. IMPRESSION: Cardiomegaly without overt pulmonary edema. Electronically Signed   By: Donavan Foil M.D.   On: 05/25/2018 01:06   Dg Foot 2 Views Left  Result Date: 05/25/2018 CLINICAL DATA:  Heel decubitus ulcer. History of gout. EXAM: LEFT FOOT - 2 VIEW COMPARISON:  None. FINDINGS: There is no evidence of acute fracture or dislocation. There are small marginal in juxta-articular erosions with sharply defined margins at the first toe IP joint, most notable involving the medial aspect of the proximal phalanx. There may also be an erosion involving the head of the first metatarsal. No osseous erosion is seen elsewhere with special attention to the calcaneus. A small plantar calcaneal enthesophyte is noted. Extensive atherosclerotic vascular calcification and diffuse soft tissue swelling are present. IMPRESSION: 1. Small erosions at the great toe IP joint and possibly first metatarsal head which may be related to patient's history of gout. Osteomyelitis is considered unlikely unless there is an unreported ulcer in this region. 2. No evidence of active osteomyelitis involving the hindfoot. Electronically Signed   By: Logan Bores M.D.   On: 05/25/2018 11:25   Dg Foot 2 Views Right  Result Date: 05/25/2018 CLINICAL DATA:  He will decubitus ulcer. EXAM: RIGHT FOOT - 2  VIEW COMPARISON:  01/19/2018 right great toe radiographs and 11/06/2017 right foot radiographs FINDINGS: There has been further interval amputation of the first and second rays with resection of the residual proximal phalanges which were present on the 01/19/2018 study. Prior third through fifth amputations are again seen at the level of the distal metatarsals. No interval osseous erosion is identified with special attention to the calcaneus. Atherosclerotic vascular calcification and diffuse soft tissue swelling are noted. No soft tissue emphysema is seen. IMPRESSION: Multiple amputations as above. No radiographic evidence of active osteomyelitis. Electronically Signed   By: Logan Bores M.D.   On: 05/25/2018 11:13    Assessment & Plan: I do not see clear evidence that he is got a lot of infection with either of these heels.  Canceled the culture on the region because there was no frank pus or drainage or even cellulitis to the area just had the wounds present on both heels as described above.  I will continue with the daily wet-to-dry dressings along with Santyl application to the ulcerations.  Also I think are ordered some heel protection boots that need to be utilized on a regular constant basis.  I will follow him again the next couple days make sure he is doing well with this.  Right now would not recommend surgery but ultimately may have to have some surgical procedures to remove the eschar from the left heel and perhaps consider wound vacs or application of grafting material to the heels.  Active Problems:   Anasarca   Pressure injury of  skin   Family Communication: Plan discussed with patient   Albertine Patricia M.D on 05/25/2018 at 1:14 PM  Thank you for the consult, we will follow the patient with you in the Hospital.

## 2018-05-25 NOTE — Consult Note (Addendum)
[place] Nurse wound consult note Reason for Consult: Consult requested for bilat heels.  Pt has chronic unstageable pressure injuries to bilat heels Pressure Injury POA: Yes Measurement: Left heel 5X8cm, 95% tightly adhered eschar with 5% moist red woundbed exposed underneath edges, mod amt tan drainage with some strong odor, no fluctuance, painful to touch Right heel 6X8cm, 90% loose eschar with 10% moist red woundbed exposed underneath edges, mod amt tan drainage with some strong odor, no fluctuance, painful to touch Dressing procedure/placement/frequency: Float heels in Prevalon boots to reduce pressure.  Santyl ointment to provide enzymatic debridement of nonviable tissue.  Discussed plan of care with patient; no family members present in room. Pt could benefit from follow-up at the outpatient wound care center after discharge for further sharp debridement once the nonviable layer of tissue is loosened.  This must be by physician referral, please order if desired.  Please re-consult if further assistance is needed.  Thank-you,  Julien Girt MSN, [place], [place], [place], [place]

## 2018-05-25 NOTE — ED Notes (Signed)
Date and time results received: 05/25/18 2:31 AM  Test: troponin Critical Value: 0.06  Name of Provider Notified: Dr Mable Paris

## 2018-05-25 NOTE — Progress Notes (Signed)
Patient ID: Paul Ala., male   DOB: 1965/06/13, 53 y.o.   MRN: [id number]  [employer] PROGRESS NOTE  Paul Ala. DZH:[id number] DOB: [birth year] DOA: 05/24/2018 PCP: Henderson Baltimore, FNP  HPI/Subjective: Patient tired because he did not sleep last night.  Came in because his heels have been hurting him.  States he was at a rehab for a long period of time.  Feels some shortness of breath.  States he is not urinating that much.  Objective: Vitals:   05/25/18 0446 05/25/18 0839  BP: 138/76 109/62  Pulse: 92 83  Resp: 17   Temp: 98.6 F (37 C)   SpO2: 100%     Filed Weights   05/24/18 2354 05/25/18 0446  Weight: 104.3 kg 106.2 kg    ROS: Review of Systems  Constitutional: Negative for chills and fever.  Eyes: Negative for blurred vision.  Respiratory: Positive for shortness of breath. Negative for cough.   Cardiovascular: Negative for chest pain.  Gastrointestinal: Negative for abdominal pain, constipation, diarrhea, nausea and vomiting.  Genitourinary: Negative for dysuria.  Musculoskeletal: Positive for joint pain.  Neurological: Negative for dizziness and headaches.   Exam: Physical Exam  Constitutional: He is oriented to person, place, and time.  HENT:  Nose: No mucosal edema.  Mouth/Throat: No oropharyngeal exudate or posterior oropharyngeal edema.  Eyes: Pupils are equal, round, and reactive to light. Conjunctivae, EOM and lids are normal.  Neck: No JVD present. Carotid bruit is not present. No edema present. No thyroid mass and no thyromegaly present.  Cardiovascular: S1 normal and S2 normal. Exam reveals no gallop.  No murmur heard. Pulses:      Dorsalis pedis pulses are 2+ on the right side and 2+ on the left side.  Respiratory: No respiratory distress. He has no wheezes. He has no rhonchi. He has no rales.  GI: Soft. Bowel sounds are normal. There is no abdominal tenderness.  Musculoskeletal:     Right ankle: He exhibits swelling.     Left  ankle: He exhibits swelling.  Lymphadenopathy:    He has no cervical adenopathy.  Neurological: He is alert and oriented to person, place, and time. No cranial nerve deficit.  Skin: Skin is warm. Nails show no clubbing.  Bilateral heel ulcerations with blackened material inside. Chronic lower extremity discoloration.  Psychiatric: He has a normal mood and affect.      Data Reviewed: Basic Metabolic Panel: Recent Labs  Lab 05/25/18 0134 05/25/18 0601  NA 137 135  K 3.7 3.3*  CL 102 101  CO2 23 24  GLUCOSE 398* 305*  BUN 80* 79*  CREATININE 2.81* 2.57*  CALCIUM 8.7* 8.4*   Liver Function Tests: Recent Labs  Lab 05/25/18 0134  AST 44*  ALT 33  ALKPHOS 189*  BILITOT 1.2  PROT 7.5  ALBUMIN 3.1*   CBC: Recent Labs  Lab 05/25/18 0036 05/25/18 0601  WBC 17.8* 17.6*  NEUTROABS 16.2*  --   HGB 9.8* 8.6*  HCT 32.6* 28.6*  MCV 85.1 85.9  PLT 356 377   Cardiac Enzymes: Recent Labs  Lab 05/25/18 0134  TROPONINI 0.06*   BNP (last 3 results) Recent Labs    05/25/18 0038  BNP 129.0*     CBG: Recent Labs  Lab 05/25/18 0751 05/25/18 1156  GLUCAP 235* 123*     Studies: Dg Chest Port 1 View  Result Date: 05/25/2018 CLINICAL DATA:  Shortness of breath EXAM: PORTABLE CHEST 1 VIEW COMPARISON:  03/03/2018 FINDINGS:  Post sternotomy changes. Left-sided pacing device as before. Cardiomegaly. No consolidation or effusion. No pneumothorax. IMPRESSION: Cardiomegaly without overt pulmonary edema. Electronically Signed   By: Donavan Foil M.D.   On: 05/25/2018 01:06   Dg Foot 2 Views Left  Result Date: 05/25/2018 CLINICAL DATA:  Heel decubitus ulcer. History of gout. EXAM: LEFT FOOT - 2 VIEW COMPARISON:  None. FINDINGS: There is no evidence of acute fracture or dislocation. There are small marginal in juxta-articular erosions with sharply defined margins at the first toe IP joint, most notable involving the medial aspect of the proximal phalanx. There may also be an  erosion involving the head of the first metatarsal. No osseous erosion is seen elsewhere with special attention to the calcaneus. A small plantar calcaneal enthesophyte is noted. Extensive atherosclerotic vascular calcification and diffuse soft tissue swelling are present. IMPRESSION: 1. Small erosions at the great toe IP joint and possibly first metatarsal head which may be related to patient's history of gout. Osteomyelitis is considered unlikely unless there is an unreported ulcer in this region. 2. No evidence of active osteomyelitis involving the hindfoot. Electronically Signed   By: Logan Bores M.D.   On: 05/25/2018 11:25   Dg Foot 2 Views Right  Result Date: 05/25/2018 CLINICAL DATA:  He will decubitus ulcer. EXAM: RIGHT FOOT - 2 VIEW COMPARISON:  01/19/2018 right great toe radiographs and 11/06/2017 right foot radiographs FINDINGS: There has been further interval amputation of the first and second rays with resection of the residual proximal phalanges which were present on the 01/19/2018 study. Prior third through fifth amputations are again seen at the level of the distal metatarsals. No interval osseous erosion is identified with special attention to the calcaneus. Atherosclerotic vascular calcification and diffuse soft tissue swelling are noted. No soft tissue emphysema is seen. IMPRESSION: Multiple amputations as above. No radiographic evidence of active osteomyelitis. Electronically Signed   By: Logan Bores M.D.   On: 05/25/2018 11:13    Scheduled Meds: . aspirin EC  81 mg Oral Daily  . carvedilol  12.5 mg Oral BID  . collagenase   Topical Daily  . furosemide  80 mg Intravenous BID  . gabapentin  400 mg Oral TID  . insulin aspart  0-15 Units Subcutaneous TID WC  . insulin aspart  0-5 Units Subcutaneous QHS  . insulin glargine  15 Units Subcutaneous BID  . potassium chloride  20 mEq Oral Daily  . Rivaroxaban  15 mg Oral Q supper  . senna-docusate  2 tablet Oral QHS  . sodium chloride  flush  3 mL Intravenous Q12H   Continuous Infusions: . sodium chloride 250 mL (05/25/18 1208)  . ampicillin-sulbactam (UNASYN) IV    . [START ON 05/27/2018] vancomycin    . vancomycin 2,000 mg (05/25/18 1208)    Assessment/Plan:  1. Bilateral heel ulcerations concerning for infection with some necrotic material there.  Start antibiotics Unasyn and vancomycin.  Case discussed with podiatry Dr. Elvina Mattes to see the patient.  X-ray does not show any signs of osteomyelitis.  And on a sedimentation rate.  CT scan bilateral heels without contrast to rule out osteomyelitis. 2. Acute on chronic systolic congestive heart failure with anasarca.  Started on Lasix 80 mg IV twice daily.  Continue to monitor closely.  Echo pending. 3. Chronic kidney disease stage III borderline 4.  Watch with diuresis. 4. Anemia of chronic disease.  Continue to monitor 5. History of atrial fibrillation.  Hold Xarelto for now since the patient may  end up needing a debridement. 6. Type 2 diabetes mellitus on glargine insulin and sliding scale 7. Diabetic neuropathy on gabapentin    Code Status:     Code Status Orders  (From admission, onward)         Start     Ordered   05/25/18 0352  Full code  Continuous     05/25/18 0352        Code Status History    Date Active Date Inactive Code Status Order ID Comments User Context   10/09/2016 1003 10/09/2016 1340 Full Code [id number]  Samara Deist, [facility] Inpatient   08/22/2016 0042 08/27/2016 1649 Full Code [id number]  Lance Coon, MD Inpatient   07/18/2014 1818 07/20/2014 2002 Full Code [id number]  Leighton Parody, PA-C Inpatient     Disposition Plan: To be determined  Consultants:  Podiatry  Antibiotics:  Unasyn  Vancomycin  Time spent: 35 minutes in coordination of care speaking with podiatry  [place]

## 2018-05-25 NOTE — Progress Notes (Signed)
*  PRELIMINARY RESULTS* Echocardiogram 2D Echocardiogram has been performed.  Paul [name] 05/25/2018, 9:57 AM

## 2018-05-25 NOTE — Consult Note (Signed)
Pharmacy Antibiotic Note  Paul [name]. is a 53 y.o. male admitted on 05/24/2018 with Wound infections on both heels.  Pharmacy has been consulted for Vancomycin and Unasyn dosing.  Plan: Vancomycin 2000mg  loading dose.  Followed by Vancomycin 1250 mg IV Q 36 hrs.  Goal AUC 400-550. Expected AUC: 478 SCr used: 2.57  Start Unasyn 3g IV every 6 hours   Height: 5\' 10"  (177.8 cm) Weight: 234 lb 1.6 oz (106.2 kg) IBW/kg (Calculated) : 73  Temp (24hrs), Avg:98.3 F (36.8 C), Min:97.9 F (36.6 C), Max:98.6 F (37 C)  Recent Labs  Lab 05/25/18 0036 05/25/18 0134 05/25/18 0601  WBC 17.8*  --  17.6*  CREATININE  --  2.81* 2.57*    Estimated Creatinine Clearance: 40.6 mL/min (A) (by C-G formula based on SCr of 2.57 mg/dL (H)).    Allergies  Allergen Reactions  . Ibuprofen Other (See Comments)    Heart problems  . Baclofen Other (See Comments)  . Metformin Diarrhea  . Nsaids     Due to kidney and heart problems per pt    Antimicrobials this admission: 1/22 vancomycin >>  1/22 Unasyn  >>   Microbiology results: 1/22 Heel Cx: pending  Thank you for allowing pharmacy to be a part of this patient's care.  Pernell Dupre, PharmD, BCPS Clinical Pharmacist 05/25/2018 12:26 PM

## 2018-05-25 NOTE — ED Notes (Signed)
Pt cleaned of stool from home by this tech and sarah (NT).

## 2018-05-26 LAB — BASIC METABOLIC PANEL
Anion gap: 8 (ref 5–15)
BUN: 76 mg/dL — ABNORMAL HIGH (ref 6–20)
CO2: 26 mmol/L (ref 22–32)
Calcium: 8.2 mg/dL — ABNORMAL LOW (ref 8.9–10.3)
Chloride: 104 mmol/L (ref 98–111)
Creatinine, Ser: 2.81 mg/dL — ABNORMAL HIGH (ref 0.61–1.24)
GFR calc Af Amer: 28 mL/min — ABNORMAL LOW (ref >60)
GFR calc non Af Amer: 25 mL/min — ABNORMAL LOW (ref >60)
Glucose, Bld: 70 mg/dL (ref 70–99)
POTASSIUM: 3.6 mmol/L (ref 3.5–5.1)
Sodium: 138 mmol/L (ref 135–145)

## 2018-05-26 LAB — GLUCOSE, CAPILLARY
GLUCOSE-CAPILLARY: 87 mg/dL (ref 70–99)
Glucose-Capillary: 80 mg/dL (ref 70–99)
Glucose-Capillary: 55 mg/dL — ABNORMAL LOW (ref 70–99)
Glucose-Capillary: 162 mg/dL — ABNORMAL HIGH (ref 70–99)

## 2018-05-26 LAB — CBC
HCT: 26.6 % — ABNORMAL LOW (ref 39.0–52.0)
HEMOGLOBIN: 8 g/dL — AB (ref 13.0–17.0)
MCH: 25.5 pg — ABNORMAL LOW (ref 26.0–34.0)
MCHC: 30.1 g/dL (ref 30.0–36.0)
MCV: 84.7 fL (ref 80.0–100.0)
Platelets: 362 10*3/uL (ref 150–400)
RBC: 3.14 MIL/uL — AB (ref 4.22–5.81)
RDW: 18.3 % — ABNORMAL HIGH (ref 11.5–15.5)
WBC: 11.2 10*3/uL — ABNORMAL HIGH (ref 4.0–10.5)
nRBC: 0 % (ref 0.0–0.2)

## 2018-05-26 LAB — HEMOGLOBIN A1C
Hgb A1c MFr Bld: 7.9 % — ABNORMAL HIGH (ref 4.8–5.6)
Mean Plasma Glucose: 180 mg/dL

## 2018-05-26 LAB — HIV ANTIBODY (ROUTINE TESTING W REFLEX): HIV Screen 4th Generation wRfx: NONREACTIVE

## 2018-05-26 LAB — OCCULT BLOOD X 1 CARD TO LAB, STOOL: Fecal Occult Bld: NEGATIVE

## 2018-05-26 LAB — FERRITIN: Ferritin: 248 ng/mL (ref 24–336)

## 2018-05-26 MED ORDER — PREDNISONE 20 MG PO TABS
20.0000 mg | ORAL_TABLET | Freq: Every day | ORAL | Status: DC
  Administered 2018-05-26 – 2018-05-28 (×3): 20 mg via ORAL
  Filled 2018-05-26 (×4): qty 1

## 2018-05-26 MED ORDER — TORSEMIDE 20 MG PO TABS
40.0000 mg | ORAL_TABLET | Freq: Two times a day (BID) | ORAL | Status: DC
  Administered 2018-05-26 – 2018-05-29 (×6): 40 mg via ORAL
  Filled 2018-05-26 (×6): qty 2

## 2018-05-26 NOTE — Progress Notes (Signed)
Hypoglycemic Event  CBG: 55  Treatment: 8oz OJ    Symptoms: NA  Follow-up CBG: Time:1728  CBG Result:155  Possible Reasons for Event: NA  Comments/MD notified:Pime doc/ no new orders    Sreenidhi Ganson  [place]

## 2018-05-26 NOTE — Progress Notes (Signed)
Patient ID: Paul Ala., male   DOB: 05-12-1965, 53 y.o.   MRN: [id number]  [facility] PROGRESS NOTE  Paul Ala. OXB:[id number] DOB: 12-Jun-1965 DOA: 05/24/2018 PCP: Henderson Baltimore, FNP  HPI/Subjective: Patient states that he cannot move his hands very well.  Has a history of gout.  States his breathing is better.  Still having heel pain.  Objective: Vitals:   05/26/18 1231 05/26/18 1232  BP: 99/64   Pulse: 79   Resp: 16   Temp: 100.1 F (37.8 C) 99 F (37.2 C)  SpO2: 92%     Filed Weights   05/24/18 2354 05/25/18 0446 05/26/18 0413  Weight: 104.3 kg 106.2 kg 108.9 kg    ROS: Review of Systems  Constitutional: Negative for chills and fever.  Eyes: Negative for blurred vision.  Respiratory: Positive for shortness of breath. Negative for cough.   Cardiovascular: Negative for chest pain.  Gastrointestinal: Negative for abdominal pain, constipation, diarrhea, nausea and vomiting.  Genitourinary: Negative for dysuria.  Musculoskeletal: Positive for joint pain.  Neurological: Negative for dizziness and headaches.   Exam: Physical Exam  Constitutional: He is oriented to person, place, and time.  HENT:  Nose: No mucosal edema.  Mouth/Throat: No oropharyngeal exudate or posterior oropharyngeal edema.  Eyes: Pupils are equal, round, and reactive to light. Conjunctivae, EOM and lids are normal.  Neck: No JVD present. Carotid bruit is not present. No edema present. No thyroid mass and no thyromegaly present.  Cardiovascular: S1 normal and S2 normal. Exam reveals no gallop.  No murmur heard. Pulses:      Dorsalis pedis pulses are 2+ on the right side and 2+ on the left side.  Respiratory: No respiratory distress. He has no wheezes. He has no rhonchi. He has no rales.  GI: Soft. Bowel sounds are normal. There is no abdominal tenderness.  Musculoskeletal:     Right ankle: He exhibits swelling.     Left ankle: He exhibits swelling.     Comments: Bilateral hand  swelling and difficulty moving joints in his hand.  Lymphadenopathy:    He has no cervical adenopathy.  Neurological: He is alert and oriented to person, place, and time. No cranial nerve deficit.  Skin: Skin is warm. Nails show no clubbing.  Bilateral heel ulcerations with blackened material inside. Chronic lower extremity discoloration.  Psychiatric: He has a normal mood and affect.      Data Reviewed: Basic Metabolic Panel: Recent Labs  Lab 05/25/18 0134 05/25/18 0601 05/26/18 0722  NA 137 135 138  K 3.7 3.3* 3.6  CL 102 101 104  CO2 23 24 26   GLUCOSE 398* 305* 70  BUN 80* 79* 76*  CREATININE 2.81* 2.57* 2.81*  CALCIUM 8.7* 8.4* 8.2*   Liver Function Tests: Recent Labs  Lab 05/25/18 0134  AST 44*  ALT 33  ALKPHOS 189*  BILITOT 1.2  PROT 7.5  ALBUMIN 3.1*   CBC: Recent Labs  Lab 05/25/18 0036 05/25/18 0601 05/26/18 0722  WBC 17.8* 17.6* 11.2*  NEUTROABS 16.2*  --   --   HGB 9.8* 8.6* 8.0*  HCT 32.6* 28.6* 26.6*  MCV 85.1 85.9 84.7  PLT 356 377 362   Cardiac Enzymes: Recent Labs  Lab 05/25/18 0134  TROPONINI 0.06*   BNP (last 3 results) Recent Labs    05/25/18 0038  BNP 129.0*     CBG: Recent Labs  Lab 05/25/18 1156 05/25/18 1707 05/25/18 2037 05/26/18 0812 05/26/18 1153  GLUCAP 123* 82  108* 87 80     Studies: Ct Foot Left Wo Contrast  Result Date: 05/25/2018 CLINICAL DATA:  Bilateral foot swelling in a diabetic patient. Question osteomyelitis. History of gout. EXAM: CT OF THE LEFT FOOT WITHOUT CONTRAST TECHNIQUE: Multidetector CT imaging of the left foot was performed according to the standard protocol. Multiplanar CT image reconstructions were also generated. COMPARISON:  Plain films of the left foot 05/25/2018. FINDINGS: Bones/Joint/Cartilage Erosive change is seen about the IP joint of the great toe and first MTP joint with an appearance most consistent with gout. Extensive erosive change is also seen present about the tibiotalar  joint and distal fibula and lateral process of the talus most consistent with gout. No bony destructive change or periosteal reaction is identified. No fracture or dislocation. The sesamoid bones of the first MTP joint are sclerotic but not fragmented or fracture. Ligaments Suboptimally assessed by CT. Muscles and Tendons There is atrophy of intrinsic musculature the foot. No intramuscular fluid collection or tendon tear is identified. No focal lesion. Soft tissues A focal collection in the subcutaneous tissues on the plantar surface of the foot at the heel measures 2.4 cm long by 2 cm transverse by 1.8 cm craniocaudal. IMPRESSION: Diffuse subcutaneous edema about the foot could be due to dependent change or cellulitis. Focal subcutaneous fluid in the plantar soft tissues of the heel could be due to adventitial bursa formation, focal cellulitis or possibly abscess. Abscess is thought less likely as no skin wound is identified in this location. Negative for osteomyelitis or septic joint. Findings most consistent with gouty arthropathy about the great toe and ankle. Electronically Signed   By: Inge Rise M.D.   On: 05/25/2018 13:51   Ct Foot Right Wo Contrast  Result Date: 05/25/2018 CLINICAL DATA:  Right foot pain and swelling in a diabetic patient. EXAM: CT OF THE RIGHT FOOT WITHOUT CONTRAST TECHNIQUE: Multidetector CT imaging of the right foot was performed according to the standard protocol. Multiplanar CT image reconstructions were also generated. COMPARISON:  Plain films right foot 05/25/2018. FINDINGS: Bones/Joint/Cartilage The patient is status post amputation of the first and second toes. Amputation at the level of the neck of the third, fourth and fifth metatarsals is also identified. No bony destructive change or periosteal reaction to suggest osteomyelitis is identified. No erosion is seen. No fracture or dislocation. Mild sclerosis of the sesamoid bones of the first MTP joint without fracture or  fragmentation noted. Ligaments Suboptimally assessed by CT. Muscles and Tendons Atrophy of intrinsic musculature of the foot is noted. No intramuscular fluid collection. No tendon tear. Soft tissues Subcutaneous edema is seen about the foot. No focal fluid collection. Atherosclerosis noted. IMPRESSION: Subcutaneous edema about the foot could be due to cellulitis or dependent change. Negative for abscess, osteomyelitis or evidence of septic joint. 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 Atherosclerosis. Electronically Signed   By: Inge Rise M.D.   On: 05/25/2018 13:56   Dg Chest Port 1 View  Result Date: 05/25/2018 CLINICAL DATA:  Shortness of breath EXAM: PORTABLE CHEST 1 VIEW COMPARISON:  03/03/2018 FINDINGS: Post sternotomy changes. Left-sided pacing device as before. Cardiomegaly. No consolidation or effusion. No pneumothorax. IMPRESSION: Cardiomegaly without overt pulmonary edema. Electronically Signed   By: Donavan Foil M.D.   On: 05/25/2018 01:06   Dg Foot 2 Views Left  Result Date: 05/25/2018 CLINICAL DATA:  Heel decubitus ulcer. History of gout. EXAM: LEFT FOOT -  2 VIEW COMPARISON:  None. FINDINGS: There is no evidence of acute fracture or dislocation. There are small marginal in juxta-articular erosions with sharply defined margins at the first toe IP joint, most notable involving the medial aspect of the proximal phalanx. There may also be an erosion involving the head of the first metatarsal. No osseous erosion is seen elsewhere with special attention to the calcaneus. A small plantar calcaneal enthesophyte is noted. Extensive atherosclerotic vascular calcification and diffuse soft tissue swelling are present. IMPRESSION: 1. Small erosions at the great toe IP joint and possibly first metatarsal head which may be related to patient's history of gout. Osteomyelitis is considered unlikely unless there is an unreported ulcer  in this region. 2. No evidence of active osteomyelitis involving the hindfoot. Electronically Signed   By: Logan Bores M.D.   On: 05/25/2018 11:25   Dg Foot 2 Views Right  Result Date: 05/25/2018 CLINICAL DATA:  He will decubitus ulcer. EXAM: RIGHT FOOT - 2 VIEW COMPARISON:  01/19/2018 right great toe radiographs and 11/06/2017 right foot radiographs FINDINGS: There has been further interval amputation of the first and second rays with resection of the residual proximal phalanges which were present on the 01/19/2018 study. Prior third through fifth amputations are again seen at the level of the distal metatarsals. No interval osseous erosion is identified with special attention to the calcaneus. Atherosclerotic vascular calcification and diffuse soft tissue swelling are noted. No soft tissue emphysema is seen. IMPRESSION: Multiple amputations as above. No radiographic evidence of active osteomyelitis. Electronically Signed   By: Logan Bores M.D.   On: 05/25/2018 11:13    Scheduled Meds: . aspirin EC  81 mg Oral Daily  . carvedilol  3.125 mg Oral BID  . collagenase   Topical Daily  . gabapentin  400 mg Oral TID  . insulin aspart  0-15 Units Subcutaneous TID WC  . insulin aspart  0-5 Units Subcutaneous QHS  . insulin glargine  15 Units Subcutaneous BID  . potassium chloride  20 mEq Oral Daily  . predniSONE  20 mg Oral Q breakfast  . Rivaroxaban  15 mg Oral Q supper  . senna-docusate  2 tablet Oral QHS  . sodium chloride flush  3 mL Intravenous Q12H  . torsemide  40 mg Oral BID   Continuous Infusions: . sodium chloride Stopped (05/26/18 0325)  . ampicillin-sulbactam (UNASYN) IV 3 g (05/26/18 1346)  . [START ON 05/27/2018] vancomycin      Assessment/Plan:  1. Bilateral heel ulcerations concerning for infection.  Patient with fever last night.  Start antibiotics Unasyn and vancomycin.  Appreciate podiatry consultation. 2. Acute on chronic systolic congestive heart failure with anasarca.   Switch back to oral torsemide..  Continue to monitor closely.  Echo shows EF 25 to 30%. 3. Chronic kidney disease stage III borderline 4.  Watch with diuresis.  Change back to oral diuresis. 4. Anemia of chronic disease.  Continue to monitor.  Guaiac stools.  Added on a ferritin which was consistent with anemia of chronic disease 5. History of atrial fibrillation.  Restarted Xarelto. 6. Type 2 diabetes mellitus on glargine insulin and sliding scale 7. Diabetic neuropathy on gabapentin 8. Gout and hands and difficulty opening hands and closing hands.  Start low-dose prednisone    Code Status:     Code Status Orders  (From admission, onward)         Start     Ordered   05/25/18 0352  Full code  Continuous  05/25/18 0352        Code Status History    Date Active Date Inactive Code Status Order ID Comments User Context   10/09/2016 1003 10/09/2016 1340 Full Code [id number]  Samara Deist, [facility] Inpatient   08/22/2016 0042 08/27/2016 1649 Full Code [id number]  Lance Coon, MD Inpatient   07/18/2014 1818 07/20/2014 2002 Full Code [id number]  Leighton Parody, PA-C Inpatient     Disposition Plan: To be determined  Consultants:  Podiatry  Antibiotics:  Unasyn  Vancomycin  Time spent: 28 minutes  [place]

## 2018-05-27 LAB — GLUCOSE, CAPILLARY
Glucose-Capillary: 132 mg/dL — ABNORMAL HIGH (ref 70–99)
Glucose-Capillary: 117 mg/dL — ABNORMAL HIGH (ref 70–99)
Glucose-Capillary: 181 mg/dL — ABNORMAL HIGH (ref 70–99)
Glucose-Capillary: 231 mg/dL — ABNORMAL HIGH (ref 70–99)
Glucose-Capillary: 256 mg/dL — ABNORMAL HIGH (ref 70–99)

## 2018-05-27 MED ORDER — OXYCODONE HCL 5 MG PO TABS
10.0000 mg | ORAL_TABLET | Freq: Four times a day (QID) | ORAL | Status: DC | PRN
  Administered 2018-05-27 – 2018-05-29 (×6): 10 mg via ORAL
  Filled 2018-05-27 (×6): qty 2

## 2018-05-27 MED ORDER — INSULIN GLARGINE 100 UNIT/ML ~~LOC~~ SOLN
15.0000 [IU] | Freq: Every day | SUBCUTANEOUS | Status: DC
  Administered 2018-05-27 – 2018-05-28 (×2): 15 [IU] via SUBCUTANEOUS
  Filled 2018-05-27 (×3): qty 0.15

## 2018-05-27 NOTE — Progress Notes (Signed)
Patient ID: Paul Ala., male   DOB: [birth year], 53 y.o.   MRN: [id number]  [facility] PROGRESS NOTE  Paul Ala. [id number]:[id number] DOB: 08-Oct-1965 DOA: 05/24/2018 PCP: Henderson Baltimore, FNP  HPI/Subjective: Patient not feeling well.  Can move his hands better than yesterday.  Urinating well.  No complaints of shortness of breath.  Complaints of heel pain and concerned about his pain medications.  Objective: Vitals:   05/27/18 0420 05/27/18 1117  BP: 136/81 131/72  Pulse: 74 79  Resp: 18 18  Temp: (!) 97.4 F (36.3 C) 98.5 F (36.9 C)  SpO2: 99% 93%    Filed Weights   05/26/18 0413 05/27/18 0420 05/27/18 1117  Weight: 108.9 kg 106.7 kg 107 kg    ROS: Review of Systems  Constitutional: Negative for chills and fever.  Eyes: Negative for blurred vision.  Respiratory: Positive for shortness of breath. Negative for cough.   Cardiovascular: Negative for chest pain.  Gastrointestinal: Negative for abdominal pain, constipation, diarrhea, nausea and vomiting.  Genitourinary: Negative for dysuria.  Musculoskeletal: Positive for joint pain.  Neurological: Negative for dizziness and headaches.   Exam: Physical Exam  Constitutional: He is oriented to person, place, and time.  HENT:  Nose: No mucosal edema.  Mouth/Throat: No oropharyngeal exudate or posterior oropharyngeal edema.  Eyes: Pupils are equal, round, and reactive to light. Conjunctivae, EOM and lids are normal.  Neck: No JVD present. Carotid bruit is not present. No edema present. No thyroid mass and no thyromegaly present.  Cardiovascular: S1 normal and S2 normal. Exam reveals no gallop.  No murmur heard. Pulses:      Dorsalis pedis pulses are 2+ on the right side and 2+ on the left side.  Respiratory: No respiratory distress. He has no wheezes. He has no rhonchi. He has no rales.  GI: Soft. Bowel sounds are normal. There is no abdominal tenderness.  Musculoskeletal:     Right ankle: He exhibits swelling.      Left ankle: He exhibits swelling.     Comments: Bilateral hand swelling and patient moving his joints a little better today after starting prednisone yesterday  Lymphadenopathy:    He has no cervical adenopathy.  Neurological: He is alert and oriented to person, place, and time. No cranial nerve deficit.  Skin: Skin is warm. Nails show no clubbing.  Bilateral heel ulcerations with blackened eschar Chronic lower extremity discoloration.  Psychiatric: He has a normal mood and affect.      Data Reviewed: Basic Metabolic Panel: Recent Labs  Lab 05/25/18 0134 05/25/18 0601 05/26/18 0722  NA 137 135 138  K 3.7 3.3* 3.6  CL 102 101 104  CO2 23 24 26   GLUCOSE 398* 305* 70  BUN 80* 79* 76*  CREATININE 2.81* 2.57* 2.81*  CALCIUM 8.7* 8.4* 8.2*   Liver Function Tests: Recent Labs  Lab 05/25/18 0134  AST 44*  ALT 33  ALKPHOS 189*  BILITOT 1.2  PROT 7.5  ALBUMIN 3.1*   CBC: Recent Labs  Lab 05/25/18 0036 05/25/18 0601 05/26/18 0722  WBC 17.8* 17.6* 11.2*  NEUTROABS 16.2*  --   --   HGB 9.8* 8.6* 8.0*  HCT 32.6* 28.6* 26.6*  MCV 85.1 85.9 84.7  PLT 356 377 362   Cardiac Enzymes: Recent Labs  Lab 05/25/18 0134  TROPONINI 0.06*   BNP (last 3 results) Recent Labs    05/25/18 0038  BNP 129.0*     CBG: Recent Labs  Lab 05/26/18 1649  05/26/18 2056 05/27/18 0518 05/27/18 0852 05/27/18 1328  GLUCAP 55* 162* 132* 117* 181*     Scheduled Meds: . aspirin EC  81 mg Oral Daily  . carvedilol  3.125 mg Oral BID  . collagenase   Topical Daily  . gabapentin  400 mg Oral TID  . insulin aspart  0-15 Units Subcutaneous TID WC  . insulin aspart  0-5 Units Subcutaneous QHS  . insulin glargine  15 Units Subcutaneous QHS  . potassium chloride  20 mEq Oral Daily  . predniSONE  20 mg Oral Q breakfast  . Rivaroxaban  15 mg Oral Q supper  . senna-docusate  2 tablet Oral QHS  . sodium chloride flush  3 mL Intravenous Q12H  . torsemide  40 mg Oral BID    Continuous Infusions: . sodium chloride 250 mL (05/27/18 1131)  . ampicillin-sulbactam (UNASYN) IV 3 g (05/27/18 1133)  . vancomycin Stopped (05/27/18 2831)    Assessment/Plan:  1. Bilateral heel ulcerations concerning for infection.  Patient with fever again last night.  Continue antibiotics Unasyn and vancomycin.  Appreciate podiatry consultation. 2. Acute on chronic systolic congestive heart failure with anasarca.  Switch back to oral torsemide..  Continue to monitor closely.  Echo shows EF 25 to 30%.  Daily weights.  Patient states his usual weight is around 230.  Was 235.4 today 3. Chronic kidney disease stage III borderline 4.  Watch with diuresis. 4. Anemia of chronic disease.  Continue to monitor.  Guaiac stools.  Added on a ferritin which was consistent with anemia of chronic disease 5. History of atrial fibrillation.  Restarted Xarelto. 6. Type 2 diabetes mellitus on glargine insulin and sliding scale 7. Diabetic neuropathy on gabapentin 8. Gout and hands and difficulty opening hands and closing hands.  Improved today.  On low-dose prednisone    Code Status:     Code Status Orders  (From admission, onward)         Start     Ordered   05/25/18 0352  Full code  Continuous     05/25/18 0352        Code Status History    Date Active Date Inactive Code Status Order ID Comments User Context   10/09/2016 1003 10/09/2016 1340 Full Code [id number]  Samara Deist, [facility] Inpatient   08/22/2016 0042 08/27/2016 1649 Full Code [id number]  Lance Coon, MD Inpatient   07/18/2014 1818 07/20/2014 2002 Full Code [id number]  Leighton Parody, PA-C Inpatient     Disposition Plan: To be determined  Consultants:  Podiatry  Antibiotics:  Unasyn  Vancomycin  Time spent: 27 minutes  [facility]           Patient ID: Paul Ala., male   DOB: Mar 15, 1966, 53 y.o.   MRN: [id number]

## 2018-05-27 NOTE — Consult Note (Signed)
Pharmacy Antibiotic Note  Paul [name]. is a 53 y.o. male admitted on 05/24/2018 with Wound infections on both heels.  Pharmacy was consulted for Vancomycin and Unasyn dosing. Podiatry says he doesn't see any obvious infection on heels. However, he continues to spike fevers, which could be steroid induced but are also leading to cautious antibiotic de-escalation. His last fever was 101F 1/23 at 1600. It does not appear that the vancomycin will be continued, therefore I am not planning for levels at this time.  Plan: 1) Continue vancomycin 1250 mg IV Q 36 hrs.  Goal AUC 400-550. Expected AUC: 512 SCr used: 2.81 BMI 33.7  2) continue Unasyn 3g IV every 6 hours   Height: 5\' 10"  (177.8 cm) Weight: 235 lb 14.3 oz (107 kg) IBW/kg (Calculated) : 73  Temp (24hrs), Avg:99.3 F (37.4 C), Min:97.4 F (36.3 C), Max:101 F (38.3 C)  Recent Labs  Lab 05/25/18 0036 05/25/18 0134 05/25/18 0601 05/26/18 0722  WBC 17.8*  --  17.6* 11.2*  CREATININE  --  2.81* 2.57* 2.81*    Estimated Creatinine Clearance: 37.2 mL/min (A) (by C-G formula based on SCr of 2.81 mg/dL (H)).    Antimicrobials this admission: 1/22 vancomycin >>  1/22 Unasyn  >>   Microbiology results: No microbiology cultures are pending or resulted at this time  Thank you for allowing pharmacy to be a part of this patient's care.  Dallie Piles, PharmD Clinical Pharmacist 05/27/2018 1:17 PM

## 2018-05-27 NOTE — Care Management Note (Addendum)
Case Management Note  Patient Details  Name: Paul [name]. MRN: [id number] Date of Birth: 1966/02/11  Subjective/Objective:     Admitted to [facility] with the diagnosis of anasarca. Lives with friends. Brother is Roderic Palau [phone]). Last seen a Dr. Neta Ehlers ( Nephrology)  at [facility] 05/16/18. Sees a Dr. Garwin Brothers as primary.  Seen at [facility] April 2018. Right foot abscess.   Wound vac per [place] when discharged.  Rolling Walker in the home.   Consult per Dr. Elvina Mattes ([place]) no surgery at this time. May need long term antibiotics              Action/Plan:  Will continue to follow for transition of care needs    Expected Discharge Date:                  Expected Discharge Plan:     In-House Referral:   yes  Discharge planning Services     Post Acute Care Choice:    Choice offered to:     DME Arranged:    DME Agency:     HH Arranged:    [place] Agency:     Status of Service:     If discussed at [employer], dates discussed:    Additional Comments:  Shelbie Ammons, RN MSN CCM Care Management [phone] 05/27/2018, 1:20 PM
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
## 2018-05-27 NOTE — Progress Notes (Signed)
Inpatient Diabetes Program Recommendations  AACE/ADA: New Consensus Statement on Inpatient Glycemic Control (2015)  Target Ranges:  Prepandial:   less than 140 mg/dL      Peak postprandial:   less than 180 mg/dL (1-2 hours)      Critically ill patients:  140 - 180 mg/dL   Lab Results  Component Value Date   GLUCAP 117 (H) 05/27/2018   HGBA1C 7.9 (H) 05/25/2018    Review of Glycemic ControlResults for Paul [name], Paul [name] (MRN [id number]) as of 05/27/2018 09:18  Ref. Range 05/26/2018 16:49 05/26/2018 20:56 05/27/2018 05:18 05/27/2018 08:52  Glucose-Capillary Latest Ref Range: 70 - 99 mg/dL 55 (L) 162 (H) 132 (H) 117 (H)   Diabetes history: DM 2 Outpatient Diabetes medications:  Lantus 15 units bid Current orders for Inpatient glycemic control:  Lantus 15 units bid, Novolog moderate tid with meals and HS  Inpatient Diabetes Program Recommendations:    Note blood sugars reduced on 1/23.  May consider reducing Lantus to 15 units once a day while in the hospital.  Thanks,  Adah Perl, RN, BC-ADM Inpatient Diabetes Coordinator Pager [phone] (8a-5p)

## 2018-05-27 NOTE — Care Management Important Message (Signed)
Important Message  Patient Details  Name: Paul [name]. MRN: [id number] Date of Birth: 12-13-1965   Medicare Important Message Given:  Yes    [name] 05/27/2018, 11:58 AM

## 2018-05-27 NOTE — Progress Notes (Signed)
Attempt iv x2, without success. IV team consulted

## 2018-05-28 LAB — BASIC METABOLIC PANEL
Anion gap: 9 (ref 5–15)
BUN: 72 mg/dL — AB (ref 6–20)
CO2: 28 mmol/L (ref 22–32)
Calcium: 8.3 mg/dL — ABNORMAL LOW (ref 8.9–10.3)
Chloride: 100 mmol/L (ref 98–111)
Creatinine, Ser: 2.85 mg/dL — ABNORMAL HIGH (ref 0.61–1.24)
GFR calc Af Amer: 28 mL/min — ABNORMAL LOW (ref >60)
GFR calc non Af Amer: 24 mL/min — ABNORMAL LOW (ref >60)
Glucose, Bld: 247 mg/dL — ABNORMAL HIGH (ref 70–99)
Potassium: 4.3 mmol/L (ref 3.5–5.1)
SODIUM: 137 mmol/L (ref 135–145)

## 2018-05-28 LAB — GLUCOSE, CAPILLARY
GLUCOSE-CAPILLARY: 256 mg/dL — AB (ref 70–99)
Glucose-Capillary: 211 mg/dL — ABNORMAL HIGH (ref 70–99)
Glucose-Capillary: 231 mg/dL — ABNORMAL HIGH (ref 70–99)
Glucose-Capillary: 267 mg/dL — ABNORMAL HIGH (ref 70–99)

## 2018-05-28 LAB — HEMOGLOBIN: HEMOGLOBIN: 8.4 g/dL — AB (ref 13.0–17.0)

## 2018-05-28 LAB — TYPE AND SCREEN
ABO/RH(D): AB POS
Antibody Screen: NEGATIVE

## 2018-05-28 LAB — URIC ACID: Uric Acid, Serum: 10.6 mg/dL — ABNORMAL HIGH (ref 3.7–8.6)

## 2018-05-28 MED ORDER — DOXYCYCLINE HYCLATE 100 MG PO TABS
100.0000 mg | ORAL_TABLET | Freq: Two times a day (BID) | ORAL | Status: DC
  Administered 2018-05-28 – 2018-05-29 (×3): 100 mg via ORAL
  Filled 2018-05-28 (×4): qty 1

## 2018-05-28 MED ORDER — AMOXICILLIN-POT CLAVULANATE 875-125 MG PO TABS
1.0000 | ORAL_TABLET | Freq: Two times a day (BID) | ORAL | Status: DC
  Administered 2018-05-28 – 2018-05-29 (×3): 1 via ORAL
  Filled 2018-05-28 (×3): qty 1

## 2018-05-28 MED ORDER — APIXABAN 5 MG PO TABS
5.0000 mg | ORAL_TABLET | Freq: Two times a day (BID) | ORAL | Status: DC
  Administered 2018-05-28 – 2018-05-29 (×2): 5 mg via ORAL
  Filled 2018-05-28 (×3): qty 1

## 2018-05-28 MED ORDER — ALLOPURINOL 100 MG PO TABS
50.0000 mg | ORAL_TABLET | Freq: Every day | ORAL | Status: DC
  Administered 2018-05-28 – 2018-05-29 (×2): 50 mg via ORAL
  Filled 2018-05-28 (×2): qty 0.5
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
## 2018-05-28 NOTE — Progress Notes (Signed)
Patient ID: Paul Ala., male   DOB: Nov 23, 1965, 53 y.o.   MRN: [id number]  [facility] PROGRESS NOTE  Paul Ala. NWG:[id number] DOB: 20-Jan-1966 DOA: 05/24/2018 PCP: Henderson Baltimore, FNP  HPI/Subjective: Patient lying flat.  No complaints of shortness of breath.  His hands are feeling a little bit better.  Able to bend them a little bit more.  Having a lot of heel pain when he stands up.  Had some diarrhea last night.  Objective: Vitals:   05/28/18 0457 05/28/18 1431  BP: 118/69 107/63  Pulse: 72 71  Resp: 18 18  Temp: 98.4 F (36.9 C) 98.1 F (36.7 C)  SpO2: 96% 90%    Filed Weights   05/26/18 0413 05/27/18 0420 05/27/18 1117  Weight: 108.9 kg 106.7 kg 107 kg    ROS: Review of Systems  Constitutional: Negative for chills and fever.  Eyes: Negative for blurred vision.  Respiratory: Negative for cough and shortness of breath.   Cardiovascular: Negative for chest pain.  Gastrointestinal: Positive for diarrhea. Negative for abdominal pain, constipation, nausea and vomiting.  Genitourinary: Negative for dysuria.  Musculoskeletal: Positive for joint pain.  Neurological: Negative for dizziness and headaches.   Exam: Physical Exam  Constitutional: He is oriented to person, place, and time.  HENT:  Nose: No mucosal edema.  Mouth/Throat: No oropharyngeal exudate or posterior oropharyngeal edema.  Eyes: Pupils are equal, round, and reactive to light. Conjunctivae, EOM and lids are normal.  Neck: No JVD present. Carotid bruit is not present. No edema present. No thyroid mass and no thyromegaly present.  Cardiovascular: S1 normal and S2 normal. Exam reveals no gallop.  No murmur heard. Pulses:      Dorsalis pedis pulses are 2+ on the right side and 2+ on the left side.  Respiratory: No respiratory distress. He has no wheezes. He has no rhonchi. He has no rales.  GI: Soft. Bowel sounds are normal. There is no abdominal tenderness.  Musculoskeletal:     Right  ankle: He exhibits swelling.     Left ankle: He exhibits swelling.     Comments: Bilateral hand swelling and patient moving his joints a little better today after starting prednisone yesterday  Lymphadenopathy:    He has no cervical adenopathy.  Neurological: He is alert and oriented to person, place, and time. No cranial nerve deficit.  Skin: Skin is warm. Nails show no clubbing.  Bilateral heel ulcerations with blackened eschar Chronic lower extremity discoloration.  Psychiatric: He has a normal mood and affect.      Data Reviewed: Basic Metabolic Panel: Recent Labs  Lab 05/25/18 0134 05/25/18 0601 05/26/18 0722 05/28/18 0503  NA 137 135 138 137  K 3.7 3.3* 3.6 4.3  CL 102 101 104 100  CO2 23 24 26 28   GLUCOSE 398* 305* 70 247*  BUN 80* 79* 76* 72*  CREATININE 2.81* 2.57* 2.81* 2.85*  CALCIUM 8.7* 8.4* 8.2* 8.3*   Liver Function Tests: Recent Labs  Lab 05/25/18 0134  AST 44*  ALT 33  ALKPHOS 189*  BILITOT 1.2  PROT 7.5  ALBUMIN 3.1*   CBC: Recent Labs  Lab 05/25/18 0036 05/25/18 0601 05/26/18 0722 05/28/18 0503  WBC 17.8* 17.6* 11.2*  --   NEUTROABS 16.2*  --   --   --   HGB 9.8* 8.6* 8.0* 8.4*  HCT 32.6* 28.6* 26.6*  --   MCV 85.1 85.9 84.7  --   PLT 356 377 362  --  Cardiac Enzymes: Recent Labs  Lab 05/25/18 0134  TROPONINI 0.06*   BNP (last 3 results) Recent Labs    05/25/18 0038  BNP 129.0*     CBG: Recent Labs  Lab 05/27/18 1328 05/27/18 1848 05/27/18 2150 05/28/18 0801 05/28/18 1125  GLUCAP 181* 231* 256* 211* 231*     Scheduled Meds: . allopurinol  50 mg Oral Daily  . amoxicillin-clavulanate  1 tablet Oral Q12H  . aspirin EC  81 mg Oral Daily  . carvedilol  3.125 mg Oral BID  . collagenase   Topical Daily  . doxycycline  100 mg Oral Q12H  . gabapentin  400 mg Oral TID  . insulin aspart  0-15 Units Subcutaneous TID WC  . insulin aspart  0-5 Units Subcutaneous QHS  . insulin glargine  15 Units Subcutaneous QHS  .  potassium chloride  20 mEq Oral Daily  . predniSONE  20 mg Oral Q breakfast  . sodium chloride flush  3 mL Intravenous Q12H  . torsemide  40 mg Oral BID   Continuous Infusions: . sodium chloride Stopped (05/28/18 1019)    Assessment/Plan:  1. Bilateral heel ulcerations with black eschar.  Appreciate podiatry consultation.  Will need close follow-up as outpatient.  Patient now afebrile but had fever earlier in the hospital course.  Switch antibiotics over to oral. 2. Acute on chronic systolic congestive heart failure with anasarca.  Switch back to oral torsemide..  Continue to monitor closely.  Echo shows EF 25 to 30%.  Daily weights.   3. Chronic kidney disease stage III borderline 4.  Watch with diuresis.  Patient interested in following up with his kidney docs back in [facility]. 4. Anemia of chronic disease.  Continue to monitor.  Guaiac stools.  Added on a ferritin which was consistent with anemia of chronic disease.  5. History of atrial fibrillation.  Case discussed with pharmacist and they will switch over to Eliquis secondary to the patient's kidney function. 6. Type 2 diabetes mellitus on glargine insulin and sliding scale 7. Diabetic neuropathy on gabapentin 8. Acute gout bilateral hands and difficulty opening hands and closing hands.  Improved today.  On low-dose prednisone.  Patient states he also takes allopurinol on a daily basis.  This will have to be dosed at 50 mg daily.  Patient states he also takes chronic 5 mg of prednisone daily to prevent this.    Code Status:     Code Status Orders  (From admission, onward)         Start     Ordered   05/25/18 0352  Full code  Continuous     05/25/18 0352        Code Status History    Date Active Date Inactive Code Status Order ID Comments User Context   10/09/2016 1003 10/09/2016 1340 Full Code [id number]  Samara Deist, [facility] Inpatient   08/22/2016 0042 08/27/2016 1649 Full Code [id number]  Lance Coon, MD Inpatient    07/18/2014 1818 07/20/2014 2002 Full Code [id number]  Leighton Parody, PA-C Inpatient     Disposition Plan: Evaluate daily on when to go home.  Consultants:  Podiatry  Antibiotics:  Augmentin  Doxycycline  Time spent: 28 minutes, case discussed with Dr. Graylon Good [facility]

## 2018-05-28 NOTE — Progress Notes (Signed)
[facility] Podiatry                                                      Patient Demographics  Paul [name], is a 53 y.o. male   MRN: [id number]   DOB - Nov 23, 1965  Admit Date - 05/24/2018    Outpatient Primary MD for the patient is Henderson Baltimore, FNP  Consult requested in the Hospital by Loletha Grayer, MD, On 05/28/2018   With History of -  Past Medical History:  Diagnosis Date  . Anemia   . Cardiac defibrillator in place    a. Biotronik LUmax 540 DRT, (ser # [id number]).  . Carotid arterial disease ([place])    a. s/p prior LICA stenting;  b. 08/5807 Carotid U/S: 40-59% bilat ICA stenosis. Patent LICA stent.  . CKD (chronic kidney disease), stage III ([place])   . Coronary artery disease    a. 2010 s/p CABG x 3.  . DDD (degenerative disc disease), lumbosacral    L5-S1  . Diabetes ([place])    Lantus at bedtime  . Gangrene of toe of right foot ([place])   . Gout of left hand 10/06/2016  . HFrEF (heart failure with reduced ejection fraction) ([place])    a. 07/2016 Echo: EF 25-30%, diff HK, mild MR, mildly dil LA, mod reduced RV fxn, PASP 42mmHg.  Marland Kitchen Hypertension    takes Coreg daily  . Ischemic cardiomyopathy    a. 07/2016 Echo: EF 25-30%, diff HK.  . Myocardial infarction ([place]) 2009  . Peripheral vascular disease ([place])   . Sleep apnea    sleep study yr ago-unable to afford cpap  . Stroke [facility]) 09   no weakness      Past Surgical History:  Procedure Laterality Date  . AMPUTATION TOE Right 08/22/2016   Procedure: AMPUTATION TOE;  Surgeon: Samara Deist, DPM;  Location: [facility] ORS;  Service: Podiatry;  Laterality: Right;  . AMPUTATION TOE Right 10/09/2016   Procedure: AMPUTATION TOE-RIGHT 2ND MPJ;  Surgeon: Samara Deist, DPM;  Location: [facility] ORS;  Service: Podiatry;  Laterality: Right;  . APLIGRAFT PLACEMENT Right 10/09/2016   Procedure: APLIGRAFT PLACEMENT;  Surgeon:  Samara Deist, DPM;  Location: [facility] ORS;  Service: Podiatry;  Laterality: Right;  . CARDIAC DEFIBRILLATOR PLACEMENT  2011  . CAROTID ENDARTERECTOMY Left   . CHOLECYSTECTOMY  2010  . CORONARY ARTERY BYPASS GRAFT  2010   CABG x 3   . GRAFT APPLICATION Right 9/83/3825   Procedure: FULL THICKNESS SKIN GRAFT-RIGHT FOOT;  Surgeon: Algernon Huxley, MD;  Location: [facility] ORS;  Service: Vascular;  Laterality: Right;  . HIP SURGERY Right 1994  . INCISION AND DRAINAGE OF WOUND Right 08/22/2016   Procedure: IRRIGATION AND DEBRIDEMENT WOUND and wound vac placement;  Surgeon: Samara Deist, DPM;  Location: [facility] ORS;  Service: Podiatry;  Laterality: Right;  . LOWER EXTREMITY ANGIOGRAPHY Right 08/24/2016   Procedure: Lower Extremity Angiography;  Surgeon: Algernon Huxley, MD;  Location: [place] CV LAB;  Service: Cardiovascular;  Laterality: Right;  . MASS EXCISION Right 07/18/2014   Procedure: EXCISION HETEROTOPIC BONE RIGHT HIP;  Surgeon: Frederik Pear, MD;  Location: [place];  Service: Orthopedics;  Laterality: Right;  . Open Heart Surgery  2010   x 3  . WOUND DEBRIDEMENT Right 10/09/2016   Procedure: DEBRIDEMENT WOUND;  Surgeon: Samara Deist,  DPM;  Location: [facility] ORS;  Service: Podiatry;  Laterality: Right;    in for   Chief Complaint  Patient presents with  . Weakness  . Fall     HPI  Paul Mcclure  is a 53 y.o. male, patient admitted because of elevated white count and fever.  Has long history of wounds on both heels.  Has a history of partial amputation right foot.  Patient is diabetic.  States he was in rehab for a fairly long period of time and this was in [place].  He developed the heel ulcers while he was there.   Social History Social History   Tobacco Use  . Smoking status: Never Smoker  . Smokeless tobacco: Never Used  Substance Use Topics  . Alcohol use: No    Family History Family History  Problem Relation Age of Onset  . Hypertension Other   . Diabetes Other   . Diabetes Father    . Hyperlipidemia Father   . Hypertension Father   . Hyperlipidemia Sister   . Hypertension Sister   . Diabetes Sister   . Diabetes Brother   . Hypertension Brother   . Hyperlipidemia Brother     Prior to Admission medications   Medication Sig Start Date End Date Taking? Authorizing Provider  aspirin EC 81 MG tablet Take 1 tablet (81 mg total) by mouth daily. 10/21/16  Yes Theora Gianotti, NP  carvedilol (COREG) 12.5 MG tablet Take 12.5 mg by mouth 2 (two) times daily.    Yes [provider]  diphenhydramine-acetaminophen (TYLENOL PM EXTRA STRENGTH) 25-500 MG TABS tablet Take 2 tablets by mouth at bedtime as needed (mild pain).   Yes [provider]  gabapentin (NEURONTIN) 400 MG capsule Take 400 mg by mouth 3 (three) times daily.  03/03/18  Yes [provider]  insulin glargine (LANTUS) 100 unit/mL SOPN Inject 15 Units into the skin 2 (two) times daily.    Yes [provider]  oxyCODONE-acetaminophen (PERCOCET/ROXICET) 5-325 MG tablet Take 1 tablet by mouth every 4 (four) hours as needed for severe pain. 01/05/18  Yes Tegeler, Gwenyth Allegra, MD  Rivaroxaban (XARELTO) 15 MG TABS tablet Take 15 mg by mouth daily with supper.    Yes [provider]  senna-docusate (SENOKOT-S) 8.6-50 MG tablet Take 2 tablets by mouth at bedtime.    Yes [provider]    Anti-infectives (From admission, onward)   Start     Dose/Rate Route Frequency Ordered Stop   05/28/18 1000  doxycycline (VIBRA-TABS) tablet 100 mg     100 mg Oral Every 12 hours 05/28/18 0949     05/28/18 1000  amoxicillin-clavulanate (AUGMENTIN) 875-125 MG per tablet 1 tablet     1 tablet Oral Every 12 hours 05/28/18 0949     05/27/18 0000  vancomycin (VANCOCIN) 1,250 mg in sodium chloride 0.9 % 250 mL IVPB  Status:  Discontinued     1,250 mg 166.7 mL/hr over 90 Minutes Intravenous Every 36 hours 05/25/18 1225 05/28/18 0949   05/25/18 1200  Ampicillin-Sulbactam (UNASYN) 3 g  in sodium chloride 0.9 % 100 mL IVPB  Status:  Discontinued     3 g 200 mL/hr over 30 Minutes Intravenous Every 6 hours 05/25/18 1044 05/28/18 0949   05/25/18 1045  vancomycin (VANCOCIN) 2,000 mg in sodium chloride 0.9 % 500 mL IVPB     2,000 mg 250 mL/hr over 120 Minutes Intravenous  Once 05/25/18 1044 05/25/18 1408      Scheduled Meds: .  amoxicillin-clavulanate  1 tablet Oral Q12H  . aspirin EC  81 mg Oral Daily  . carvedilol  3.125 mg Oral BID  . collagenase   Topical Daily  . doxycycline  100 mg Oral Q12H  . gabapentin  400 mg Oral TID  . insulin aspart  0-15 Units Subcutaneous TID WC  . insulin aspart  0-5 Units Subcutaneous QHS  . insulin glargine  15 Units Subcutaneous QHS  . potassium chloride  20 mEq Oral Daily  . predniSONE  20 mg Oral Q breakfast  . Rivaroxaban  15 mg Oral Q supper  . sodium chloride flush  3 mL Intravenous Q12H  . torsemide  40 mg Oral BID   Continuous Infusions: . sodium chloride Stopped (05/28/18 0348)   PRN Meds:.sodium chloride, acetaminophen **OR** acetaminophen, albuterol, ondansetron **OR** ondansetron (ZOFRAN) IV, oxyCODONE  Allergies  Allergen Reactions  . Ibuprofen Other (See Comments)    Heart problems  . Baclofen Other (See Comments)  . Metformin Diarrhea  . Nsaids     Due to kidney and heart problems per pt    Physical Exam  Vitals  Blood pressure 118/69, pulse 72, temperature 98.4 F (36.9 C), temperature source Oral, resp. rate 18, height 5\' 10"  (1.778 m), weight 107 kg, SpO2 96 %.  Lower Extremity exam:  Vascular: DP pulses are diminished but palpable to the DP PT is difficult to palpate.  Dermatological: Patient has bilateral decubitus ulcers on both heels.  Each are about the same size they measure about 4 cm in diameter.  Depth is uncertain but there is dark necrotic eschar present superficially on both wounds.  Uncertain as to the full depth of these lesions.  There is no surrounding cellulitis or purulent discharge  to either 1 of these areas.  Patient is able to get up and walk to the bathroom with assistance.  Neurological: Diabetic peripheral neuropathy.  Ortho: Amputation of all digits to the right foot with apparent healing across these areas.  Data Review  CBC Recent Labs  Lab 05/25/18 0036 05/25/18 0601 05/26/18 0722 05/28/18 0503  WBC 17.8* 17.6* 11.2*  --   HGB 9.8* 8.6* 8.0* 8.4*  HCT 32.6* 28.6* 26.6*  --   PLT 356 377 362  --   MCV 85.1 85.9 84.7  --   MCH 25.6* 25.8* 25.5*  --   MCHC 30.1 30.1 30.1  --   RDW 18.0* 18.0* 18.3*  --   LYMPHSABS 0.5*  --   --   --   MONOABS 0.9  --   --   --   EOSABS 0.0  --   --   --   BASOSABS 0.0  --   --   --    ------------------------------------------------------------------------------------------------------------------  Chemistries  Recent Labs  Lab 05/25/18 0134 05/25/18 0601 05/26/18 0722 05/28/18 0503  NA 137 135 138 137  K 3.7 3.3* 3.6 4.3  CL 102 101 104 100  CO2 23 24 26 28   GLUCOSE 398* 305* 70 247*  BUN 80* 79* 76* 72*  CREATININE 2.81* 2.57* 2.81* 2.85*  CALCIUM 8.7* 8.4* 8.2* 8.3*  AST 44*  --   --   --   ALT 33  --   --   --   ALKPHOS 189*  --   --   --   BILITOT 1.2  --   --   --    ----------------------------------------------------------------------------- Assessment & Plan: Decubitus ulcers bilateral heels.  Did not appear to be infected at this  point.  Would recommend continued use of Santyl and wet-to-dry dressings with heavy gauze padding and continue use of the heel protector boots at all times the patient is in bed.  I instructed him that when he sitting in a recliner he can keep the heel suspended off the NSAIDs there is nothing touching them.  Also recommended he not sit with his legs down for long periods because of swelling can reduce oxygen flow to the tissues also.  He will likely be discharged home tomorrow.  I would recommend a vascular consult outpatient next week.  I would like to see him a week  from Monday for evaluation.  He will likely need some debridement surgically on these areas as well as wound VAC to try to promote healing to the regions also.  He states he had to have a wound VAC before when Dr. Vickki Muff treated him for an ulceration on the right foot.  Active Problems:   Anasarca   Pressure injury of skin   Family Communication: Plan discussed with patient   Albertine Patricia M.D on 05/28/2018 at 10:18 AM  Thank you for the consult, we will follow the patient with you in the Hospital.

## 2018-05-28 NOTE — Plan of Care (Signed)

## 2018-05-28 NOTE — Consult Note (Signed)
ANTICOAGULATION CONSULT NOTE - Initial Consult  Pharmacy Consult for apixaban dosing Indication: atrial fibrillation  Allergies  Allergen Reactions  . Ibuprofen Other (See Comments)    Heart problems  . Baclofen Other (See Comments)  . Metformin Diarrhea  . Nsaids     Due to kidney and heart problems per pt   Patient Measurements: Height: 5\' 10"  (177.8 cm) Weight: 235 lb 14.3 oz (107 kg) IBW/kg (Calculated) : 73  Vital Signs: Temp: 98.1 F (36.7 C) (01/25 1431) Temp Source: Oral (01/25 1431) BP: 107/63 (01/25 1431) Pulse Rate: 71 (01/25 1431)  Labs: Recent Labs    05/26/18 0722 05/28/18 0503  HGB 8.0* 8.4*  HCT 26.6*  --   PLT 362  --   CREATININE 2.81* 2.85*    Estimated Creatinine Clearance: 36.7 mL/min (A) (by C-G formula based on SCr of 2.85 mg/dL (H)).   Medical History: Past Medical History:  Diagnosis Date  . Anemia   . Cardiac defibrillator in place    a. Biotronik LUmax 540 DRT, (ser # [id number]).  . Carotid arterial disease ([place])    a. s/p prior LICA stenting;  b. 11/621 Carotid U/S: 40-59% bilat ICA stenosis. Patent LICA stent.  . CKD (chronic kidney disease), stage III ([place])   . Coronary artery disease    a. 2010 s/p CABG x 3.  . DDD (degenerative disc disease), lumbosacral    L5-S1  . Diabetes ([place])    Lantus at bedtime  . Gangrene of toe of right foot ([place])   . Gout of left hand 10/06/2016  . HFrEF (heart failure with reduced ejection fraction) ([place])    a. 07/2016 Echo: EF 25-30%, diff HK, mild MR, mildly dil LA, mod reduced RV fxn, PASP 55mmHg.  Marland Kitchen Hypertension    takes Coreg daily  . Ischemic cardiomyopathy    a. 07/2016 Echo: EF 25-30%, diff HK.  . Myocardial infarction ([place]) 2009  . Peripheral vascular disease ([place])   . Sleep apnea    sleep study yr ago-unable to afford cpap  . Stroke [facility]) 09   no weakness   Assessment: Pharmacy consulted for apixaban dosing for 53 yo male with PMH of A. Fib and CKD. Patient was taking Rivaroxaban  PTA, switching to apixaban due to patient's renal function. Last dose of rivaroxaban was 1/24 @  1723.   Plan:  Start apixaban 5mg  BID. First dose scheduled for 1800 tonight.   Patient should be counseled prior to discharge about change in medication and twice daily dosing  Monitor CBC every 3 days while inpatient per protocol.   Pernell Dupre, PharmD, BCPS Clinical Pharmacist 05/28/2018 3:28 PM

## 2018-05-29 LAB — CBC
HCT: 30 % — ABNORMAL LOW (ref 39.0–52.0)
Hemoglobin: 9 g/dL — ABNORMAL LOW (ref 13.0–17.0)
MCH: 25.4 pg — ABNORMAL LOW (ref 26.0–34.0)
MCHC: 30 g/dL (ref 30.0–36.0)
MCV: 84.7 fL (ref 80.0–100.0)
Platelets: 514 10*3/uL — ABNORMAL HIGH (ref 150–400)
RBC: 3.54 MIL/uL — ABNORMAL LOW (ref 4.22–5.81)
RDW: 18.3 % — AB (ref 11.5–15.5)
WBC: 15.2 10*3/uL — ABNORMAL HIGH (ref 4.0–10.5)
nRBC: 0 % (ref 0.0–0.2)

## 2018-05-29 LAB — BASIC METABOLIC PANEL
Anion gap: 9 (ref 5–15)
BUN: 73 mg/dL — AB (ref 6–20)
CO2: 26 mmol/L (ref 22–32)
CREATININE: 2.67 mg/dL — AB (ref 0.61–1.24)
Calcium: 8.5 mg/dL — ABNORMAL LOW (ref 8.9–10.3)
Chloride: 100 mmol/L (ref 98–111)
GFR calc Af Amer: 30 mL/min — ABNORMAL LOW (ref >60)
GFR calc non Af Amer: 26 mL/min — ABNORMAL LOW (ref >60)
Glucose, Bld: 246 mg/dL — ABNORMAL HIGH (ref 70–99)
Potassium: 4.6 mmol/L (ref 3.5–5.1)
Sodium: 135 mmol/L (ref 135–145)

## 2018-05-29 LAB — GLUCOSE, CAPILLARY
GLUCOSE-CAPILLARY: 183 mg/dL — AB (ref 70–99)
Glucose-Capillary: 200 mg/dL — ABNORMAL HIGH (ref 70–99)

## 2018-05-29 MED ORDER — OXYCODONE HCL 10 MG PO TABS: 10.0000 mg | ORAL_TABLET | Freq: Four times a day (QID) | ORAL | 0 refills | Status: DC | PRN

## 2018-05-29 MED ORDER — POTASSIUM CHLORIDE CRYS ER 20 MEQ PO TBCR: 20.0000 meq | EXTENDED_RELEASE_TABLET | Freq: Every day | ORAL | 0 refills | Status: DC

## 2018-05-29 MED ORDER — ACETAMINOPHEN 325 MG PO TABS: 650.0000 mg | ORAL_TABLET | Freq: Four times a day (QID) | ORAL | Status: DC | PRN

## 2018-05-29 MED ORDER — CARVEDILOL 3.125 MG PO TABS: 3.1250 mg | ORAL_TABLET | Freq: Two times a day (BID) | ORAL | 0 refills | Status: DC

## 2018-05-29 MED ORDER — ALLOPURINOL 100 MG PO TABS: 50.0000 mg | ORAL_TABLET | Freq: Every day | ORAL | 0 refills | Status: DC

## 2018-05-29 MED ORDER — AMOXICILLIN-POT CLAVULANATE 875-125 MG PO TABS: 1.0000 | ORAL_TABLET | Freq: Two times a day (BID) | ORAL | 0 refills | Status: DC

## 2018-05-29 MED ORDER — PREDNISONE 5 MG PO TABS: ORAL_TABLET | ORAL | 0 refills | Status: DC

## 2018-05-29 MED ORDER — COLLAGENASE 250 UNIT/GM EX OINT: TOPICAL_OINTMENT | CUTANEOUS | 0 refills | Status: DC

## 2018-05-29 MED ORDER — INSULIN GLARGINE 100 UNITS/ML SOLOSTAR PEN: 20.0000 [IU] | PEN_INJECTOR | Freq: Every day | SUBCUTANEOUS | 0 refills | Status: DC

## 2018-05-29 MED ORDER — DOXYCYCLINE HYCLATE 100 MG PO TABS: 100.0000 mg | ORAL_TABLET | Freq: Two times a day (BID) | ORAL | 0 refills | Status: DC

## 2018-05-29 MED ORDER — TORSEMIDE 20 MG PO TABS: 40.0000 mg | ORAL_TABLET | Freq: Two times a day (BID) | ORAL | 0 refills | Status: DC

## 2018-05-29 MED ORDER — APIXABAN 5 MG PO TABS: 5.0000 mg | ORAL_TABLET | Freq: Two times a day (BID) | ORAL | 0 refills | Status: DC

## 2018-05-29 MED ORDER — PREDNISONE 10 MG PO TABS: 15.0000 mg | ORAL_TABLET | Freq: Every day | ORAL | Status: DC

## 2018-05-29 NOTE — Clinical Social Work Note (Signed)
CSW met with the patient at bedside to explain the barrier for SNF admission due to having no remaining Medicare days, not having sufficient "well days" to restart his Medicare days, and not having current Medicaid. The CSW provided the patient with the option to add HHSW to his discharge orders so that he can have assistance with applying for Medicaid. The patient agreed with this plan. CSW to update RNCM of imminent discharge and need for [facility]. CSW is signing off. Please consult should needs arise.  Santiago Bumpers, MSW, Latanya Presser [phone]

## 2018-05-29 NOTE — Discharge Summary (Signed)
[place] [facility] [place] NAME: Paul [name]    MR#:  [id number]  DATE OF BIRTH:  02/25/1966  DATE OF ADMISSION:  05/24/2018 ADMITTING PHYSICIAN: Hillary Bow, MD  DATE OF DISCHARGE: 05/29/2018  PRIMARY CARE PHYSICIAN: Henderson Baltimore, FNP    ADMISSION DIAGNOSIS:  Anasarca [R60.1]  DISCHARGE DIAGNOSIS:  Bilateral decubitus ulcers and fever Acute gout bilateral hands Acute on chronic systolic congestive heart failure  SECONDARY DIAGNOSIS:   Past Medical History:  Diagnosis Date  . Anemia   . Cardiac defibrillator in place    a. Biotronik LUmax 540 DRT, (ser # [id number]).  . Carotid arterial disease ([place])    a. s/p prior LICA stenting;  b. 01/2118 Carotid U/S: 40-59% bilat ICA stenosis. Patent LICA stent.  . CKD (chronic kidney disease), stage III ([place])   . Coronary artery disease    a. 2010 s/p CABG x 3.  . DDD (degenerative disc disease), lumbosacral    L5-S1  . Diabetes ([place])    Lantus at bedtime  . Gangrene of toe of right foot ([place])   . Gout of left hand 10/06/2016  . HFrEF (heart failure with reduced ejection fraction) ([place])    a. 07/2016 Echo: EF 25-30%, diff HK, mild MR, mildly dil LA, mod reduced RV fxn, PASP 84mmHg.  Marland Kitchen Hypertension    takes Coreg daily  . Ischemic cardiomyopathy    a. 07/2016 Echo: EF 25-30%, diff HK.  . Myocardial infarction ([place]) 2009  . Peripheral vascular disease ([place])   . Sleep apnea    sleep study yr ago-unable to afford cpap  . Stroke ([place]) 09   no weakness    HOSPITAL COURSE:   1.  Bilateral heel ulcerations with black eschar bilaterally.  Appreciate podiatry consultation by Dr. Elvina Mattes.  They recommended Santyl ointment and big bulky dressings.  The patient did have fever here in the hospital and was placed on IV antibiotics and then switched over to oral for course of treatment.  CT scan negative for osteomyelitis both heels.  Heel protectors while in bed.  Follow-up with Dr. Elvina Mattes as  outpatient.  Home health for dressing changes. 2.  Acute gout and chronic gout.  Hand inflammation.  Patient was started on prednisone which improved his hands.  I will taper his prednisone to from 20 mg that is been giving him here in the hospital down to 15 for a few days then 10 for a few days and then on his chronic 5 mg of prednisone.  Restart allopurinol 50 mg daily.  CT scan of the feet did show chronic gout in the ankles. 3.  Acute on chronic systolic congestive heart failure with anasarca.  The patient was given IV diuresis with Lasix and then switched over to oral torsemide 40 mg twice daily.  Patient's echocardiogram showed an EF of 25 to 30%.  Follow-up with CHF clinic as outpatient.  Patient on low-dose Coreg.  Unable to give ACE inhibitor or spironolactone secondary to very impaired kidney function.  Can consider adding low-dose ACE as outpatient.  Spironolactone would still be contraindicated. 4.  Chronic kidney disease stage 3 borderline 4 with GFR of 30.  Continue to monitor as outpatient.  Creatinine 2.67. 5.  Atrial fibrillation.  Patient's anticoagulation switch from Xarelto over to Eliquis secondary to kidney function. 6.  Type 2 diabetes mellitus.  With relative hypoglycemia I switched her glargine insulin over to once a day dosing.  With the sugars being  up with once a day dosing I am increasing the dose to 20 units nightly. 7.  Diabetic neuropathy on gabapentin 8.  Weakness.  Physical therapy recommended rehab but the patient was in rehab for around the 100 days and has not been out of the hospital for over 60 days and will not be a candidate to go back to rehab at this point.  Case discussed with social worker about this.  Patient will be discharged with home with home health.   DISCHARGE CONDITIONS:   [place]  CONSULTS OBTAINED:  Treatment Team:  Albertine Patricia, DPM  DRUG ALLERGIES:   Allergies  Allergen Reactions  . Ibuprofen Other (See Comments)    Heart problems  .  Baclofen Other (See Comments)  . Metformin Diarrhea  . Nsaids     Due to kidney and heart problems per pt    DISCHARGE MEDICATIONS:   Allergies as of 05/29/2018      Reactions   Ibuprofen Other (See Comments)   Heart problems   Baclofen Other (See Comments)   Metformin Diarrhea   Nsaids    Due to kidney and heart problems per pt      Medication List    STOP taking these medications   oxyCODONE-acetaminophen 5-325 MG tablet Commonly known as:  PERCOCET/ROXICET   Rivaroxaban 15 MG Tabs tablet Commonly known as:  XARELTO   senna-docusate 8.6-50 MG tablet Commonly known as:  Senokot-S   TYLENOL PM EXTRA STRENGTH 25-500 MG Tabs tablet Generic drug:  diphenhydramine-acetaminophen     TAKE these medications   acetaminophen 325 MG tablet Commonly known as:  TYLENOL Take 2 tablets (650 mg total) by mouth every 6 (six) hours as needed for mild pain (or Fever >/= 101).   allopurinol 100 MG tablet Commonly known as:  ZYLOPRIM Take 0.5 tablets (50 mg total) by mouth daily. Start taking on:  May 30, 2018   amoxicillin-clavulanate 875-125 MG tablet Commonly known as:  AUGMENTIN Take 1 tablet by mouth every 12 (twelve) hours.   apixaban 5 MG Tabs tablet Commonly known as:  ELIQUIS Take 1 tablet (5 mg total) by mouth 2 (two) times daily.   aspirin EC 81 MG tablet Take 1 tablet (81 mg total) by mouth daily.   carvedilol 3.125 MG tablet Commonly known as:  COREG Take 1 tablet (3.125 mg total) by mouth 2 (two) times daily. What changed:    medication strength  how much to take   collagenase ointment Commonly known as:  SANTYL Apply daily to heels   doxycycline 100 MG tablet Commonly known as:  VIBRA-TABS Take 1 tablet (100 mg total) by mouth every 12 (twelve) hours.   gabapentin 400 MG capsule Commonly known as:  NEURONTIN Take 400 mg by mouth 3 (three) times daily.   insulin glargine 100 unit/mL Sopn Commonly known as:  LANTUS Inject 0.2 mLs (20 Units  total) into the skin at bedtime. What changed:    how much to take  when to take this   Oxycodone HCl 10 MG Tabs Take 1 tablet (10 mg total) by mouth every 6 (six) hours as needed for moderate pain.   potassium chloride SA 20 MEQ tablet Commonly known as:  K-DUR,KLOR-CON Take 1 tablet (20 mEq total) by mouth daily. Start taking on:  May 30, 2018   predniSONE 5 MG tablet Commonly known as:  DELTASONE 3 tabs po daily for three days then 2 tabs po daily for three days then one tab po daily afterwards  torsemide 20 MG tablet Commonly known as:  DEMADEX Take 2 tablets (40 mg total) by mouth 2 (two) times daily.            Durable Medical Equipment  (From admission, onward)         Start     Ordered   05/29/18 0919  For home use only DME 4 wheeled rolling walker with seat  Once    Question:  Patient needs a walker to treat with the following condition  Answer:  Decubitus ulcer of both heels   05/29/18 0918           DISCHARGE INSTRUCTIONS:   Follow-up PMD 5 days Follow-up Dr. Elvina Mattes podiatry around 2 weeks Follow-up CHF clinic  If you experience worsening of your admission symptoms, develop shortness of breath, life threatening emergency, suicidal or homicidal thoughts you must seek medical attention immediately by calling 911 or calling your MD immediately  if symptoms less severe.  You Must read complete instructions/literature along with all the possible adverse reactions/side effects for all the Medicines you take and that have been prescribed to you. Take any new Medicines after you have completely understood and accept all the possible adverse reactions/side effects.   Please note  You were cared for by a hospitalist during your hospital stay. If you have any questions about your discharge medications or the care you received while you were in the hospital after you are discharged, you can call the unit and asked to speak with the hospitalist on call if  the hospitalist that took care of you is not available. Once you are discharged, your primary care physician will handle any further medical issues. Please note that NO REFILLS for any discharge medications will be authorized once you are discharged, as it is imperative that you return to your primary care physician (or establish a relationship with a primary care physician if you do not have one) for your aftercare needs so that they can reassess your need for medications and monitor your lab values.    Today   CHIEF COMPLAINT:   Chief Complaint  Patient presents with  . Weakness  . Fall    HISTORY OF PRESENT ILLNESS:  Paul [name]  is a 53 y.o. male presented with weakness and falls.   VITAL SIGNS:  Blood pressure 136/78, pulse 70, temperature 97.7 F (36.5 C), temperature source Oral, resp. rate 18, height 5\' 10"  (1.778 m), weight 107 kg, SpO2 97 %.    PHYSICAL EXAMINATION:  GENERAL:  53 y.o.-year-old patient lying in the bed with no acute distress.  EYES: Pupils equal, round, reactive to light and accommodation. No scleral icterus. Extraocular muscles intact.  HEENT: Head atraumatic, normocephalic. Oropharynx and nasopharynx clear.  NECK:  Supple, no jugular venous distention. No thyroid enlargement, no tenderness.  LUNGS: Normal breath sounds bilaterally, no wheezing, rales,rhonchi or crepitation. No use of accessory muscles of respiration.  CARDIOVASCULAR: S1, S2 normal. No murmurs, rubs, or gallops.  ABDOMEN: Soft, non-tender, non-distended. Bowel sounds present. No organomegaly or mass.  EXTREMITIES: 3+ pedal edema, no cyanosis, or clubbing.  Better range of motion bilateral hands. NEUROLOGIC: Cranial nerves II through XII are intact. Muscle strength 5/5 in all extremities. Sensation intact. Gait not checked.  PSYCHIATRIC: The patient is alert and oriented x 3.  SKIN: Bilateral heel ulcerations with blackish eschar bilaterally  DATA REVIEW:   CBC Recent Labs  Lab  05/29/18 0402  WBC 15.2*  HGB 9.0*  HCT 30.0*  PLT  Mariano Colon  Lab 05/25/18 0134  05/29/18 0402  NA 137   < > 135  K 3.7   < > 4.6  CL 102   < > 100  CO2 23   < > 26  GLUCOSE 398*   < > 246*  BUN 80*   < > 73*  CREATININE 2.81*   < > 2.67*  CALCIUM 8.7*   < > 8.5*  AST 44*  --   --   ALT 33  --   --   ALKPHOS 189*  --   --   BILITOT 1.2  --   --    < > = values in this interval not displayed.    Cardiac Enzymes Recent Labs  Lab 05/25/18 0134  TROPONINI 0.06*      Management plans discussed with the patient, and he is in agreement.  CODE STATUS:     Code Status Orders  (From admission, onward)         Start     Ordered   05/25/18 0352  Full code  Continuous     05/25/18 0352        Code Status History    Date Active Date Inactive Code Status Order ID Comments User Context   10/09/2016 1003 10/09/2016 1340 Full Code [id number]  Samara Deist, [facility] Inpatient   08/22/2016 0042 08/27/2016 1649 Full Code [id number]  Lance Coon, MD Inpatient   07/18/2014 1818 07/20/2014 2002 Full Code [id number]  Leighton Parody, PA-C Inpatient      TOTAL TIME TAKING CARE OF THIS PATIENT: 35 minutes.    Loletha Grayer M.D on 05/29/2018 at 1:46 PM  Between 7am to 6pm - Pager - [phone]  After 6pm go to [email or web address] - password [email or web address]  [employer] Office  [phone]  CC: Primary care physician; Henderson Baltimore, FNP

## 2018-05-29 NOTE — Care Management Note (Signed)
Case Management Note  Patient Details  Name: Paul [name]. MRN: [id number] Date of Birth: 01-Dec-1965  Subjective/Objective:   Patient is ready for discharge.  Patient is open with [place], Melene Muller with [facility] notified of patient discharge.  Jermaine delivered rollator to patient room.  Friend is providing transport home.  Eliquis coupon given to patient good for a 30/day free trial.  RNCM signed off Doran Clay RN BSN [phone]                  Action/Plan:   Expected Discharge Date:  05/29/18               Expected Discharge Plan:  [place]  In-House Referral:     Discharge planning Services  CM Consult  Post Acute Care Choice:  Durable Medical Equipment, Home Health Choice offered to:  Patient  DME Arranged:  Walker rolling with seat DME Agency:  [place]:  RN, PT, OT, Nurse's Aide, Social Work [employer] Agency:  [facility]  Status of Service:  Completed, signed off  If discussed at [employer], dates discussed:    Additional Comments:  Shelbie Hutching, RN 05/29/2018, 2:31 PM

## 2018-05-30 LAB — RHEUMATOID FACTOR: Rheumatoid fact SerPl-aCnc: 13.2 IU/mL (ref 0.0–13.9)

## 2018-05-31 DIAGNOSIS — Z79899 Other long term (current) drug therapy: Secondary | ICD-10-CM | POA: Diagnosis not present

## 2018-05-31 DIAGNOSIS — N183 Chronic kidney disease, stage 3 (moderate): Secondary | ICD-10-CM | POA: Diagnosis not present

## 2018-05-31 DIAGNOSIS — Z79891 Long term (current) use of opiate analgesic: Secondary | ICD-10-CM | POA: Diagnosis not present

## 2018-05-31 DIAGNOSIS — I130 Hypertensive heart and chronic kidney disease with heart failure and stage 1 through stage 4 chronic kidney disease, or unspecified chronic kidney disease: Secondary | ICD-10-CM | POA: Diagnosis not present

## 2018-05-31 DIAGNOSIS — Z89421 Acquired absence of other right toe(s): Secondary | ICD-10-CM | POA: Diagnosis not present

## 2018-05-31 DIAGNOSIS — Z794 Long term (current) use of insulin: Secondary | ICD-10-CM | POA: Diagnosis not present

## 2018-05-31 DIAGNOSIS — L89610 Pressure ulcer of right heel, unstageable: Secondary | ICD-10-CM | POA: Diagnosis not present

## 2018-05-31 DIAGNOSIS — I5023 Acute on chronic systolic (congestive) heart failure: Secondary | ICD-10-CM | POA: Diagnosis not present

## 2018-05-31 DIAGNOSIS — L89620 Pressure ulcer of left heel, unstageable: Secondary | ICD-10-CM | POA: Diagnosis not present

## 2018-05-31 DIAGNOSIS — E1140 Type 2 diabetes mellitus with diabetic neuropathy, unspecified: Secondary | ICD-10-CM | POA: Diagnosis not present

## 2018-05-31 DIAGNOSIS — E1122 Type 2 diabetes mellitus with diabetic chronic kidney disease: Secondary | ICD-10-CM | POA: Diagnosis not present

## 2018-05-31 DIAGNOSIS — Z95810 Presence of automatic (implantable) cardiac defibrillator: Secondary | ICD-10-CM | POA: Diagnosis not present

## 2018-05-31 DIAGNOSIS — Z9114 Patient's other noncompliance with medication regimen: Secondary | ICD-10-CM | POA: Diagnosis not present

## 2018-05-31 DIAGNOSIS — Z7982 Long term (current) use of aspirin: Secondary | ICD-10-CM | POA: Diagnosis not present

## 2018-05-31 DIAGNOSIS — Z91419 Personal history of unspecified adult abuse: Secondary | ICD-10-CM | POA: Diagnosis not present

## 2018-06-01 ENCOUNTER — Other Ambulatory Visit: Payer: Self-pay | Admitting: *Deleted
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
## 2018-06-02 ENCOUNTER — Ambulatory Visit: Payer: Medicare Other | Admitting: Family

## 2018-06-02 ENCOUNTER — Other Ambulatory Visit: Payer: Self-pay | Admitting: *Deleted
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
## 2018-06-02 NOTE — Addendum Note (Signed)
Addended by: Barbaraann Faster on: 06/02/2018 03:21 PM   Modules accepted: Orders

## 2018-06-03 ENCOUNTER — Other Ambulatory Visit: Payer: Self-pay

## 2018-06-03 NOTE — Patient Outreach (Signed)
[facility]) Care Management  06/03/2018  Paul [name]. 23-Feb-1966 [id number]  Successful outreach to the patient to follow up on recent referral for transportation needs. HIPAA identifiers confirmed. The patient reports he has three upcomming appointments with no transportation due to an inability to drive at the current time. The patient states his hand is severely swollen due to gout and he has limited support within [place] who can assist with transportation needs. BSW will plan to arrange transportation to [place] appointments via [place] provided the patient with pick-up times to his appointments on 2/5 and 2/6. The patient requests BSW call later next week with pick-up time for 2/13 appointment to assure he does not get confused.   The patient is able to confirm he has obtained medications from the pharmacy. BSW will plan a follow up call to the patient in the next 1-2 weeks.  Daneen Schick, BSW, CDP [facility] [phone]

## 2018-06-04 DIAGNOSIS — E1122 Type 2 diabetes mellitus with diabetic chronic kidney disease: Secondary | ICD-10-CM | POA: Diagnosis not present

## 2018-06-04 DIAGNOSIS — L89610 Pressure ulcer of right heel, unstageable: Secondary | ICD-10-CM | POA: Diagnosis not present

## 2018-06-04 DIAGNOSIS — N183 Chronic kidney disease, stage 3 (moderate): Secondary | ICD-10-CM | POA: Diagnosis not present

## 2018-06-04 DIAGNOSIS — I5023 Acute on chronic systolic (congestive) heart failure: Secondary | ICD-10-CM | POA: Diagnosis not present

## 2018-06-04 DIAGNOSIS — L89620 Pressure ulcer of left heel, unstageable: Secondary | ICD-10-CM | POA: Diagnosis not present

## 2018-06-04 DIAGNOSIS — I130 Hypertensive heart and chronic kidney disease with heart failure and stage 1 through stage 4 chronic kidney disease, or unspecified chronic kidney disease: Secondary | ICD-10-CM | POA: Diagnosis not present

## 2018-06-06 ENCOUNTER — Other Ambulatory Visit: Payer: Self-pay | Admitting: Pharmacist

## 2018-06-06 DIAGNOSIS — N183 Chronic kidney disease, stage 3 (moderate): Secondary | ICD-10-CM | POA: Diagnosis not present

## 2018-06-06 DIAGNOSIS — L89610 Pressure ulcer of right heel, unstageable: Secondary | ICD-10-CM | POA: Diagnosis not present

## 2018-06-06 DIAGNOSIS — L89620 Pressure ulcer of left heel, unstageable: Secondary | ICD-10-CM | POA: Diagnosis not present

## 2018-06-06 DIAGNOSIS — E1122 Type 2 diabetes mellitus with diabetic chronic kidney disease: Secondary | ICD-10-CM | POA: Diagnosis not present

## 2018-06-06 DIAGNOSIS — I5023 Acute on chronic systolic (congestive) heart failure: Secondary | ICD-10-CM | POA: Diagnosis not present

## 2018-06-06 DIAGNOSIS — I130 Hypertensive heart and chronic kidney disease with heart failure and stage 1 through stage 4 chronic kidney disease, or unspecified chronic kidney disease: Secondary | ICD-10-CM | POA: Diagnosis not present

## 2018-06-06 NOTE — Patient Outreach (Signed)
[facility]) Care Management  [place]   06/06/2018  Paul [name] [birth year] [id number]  Reason for referral: Medication Assistance with apixaban, Medication Management  Referral source: [facility] RN Current insurance:Medicare A / B  PMHx includes but not limited to:  HTN, CAD, HFrEF (EF 25-30% 1/'20), aflutter on chronic anticoagulation, ICD, ICM, PVD, sleep apnea, hx stroke, CKD -IV, T2DM, neuropathy, hx transmetatarsal amputation of right foot, gout, anemia, history of falls with recent hospitalization for acute on chronic gout and CHF with anasarca, and bilateral heel ulcerations, CT neg for OM.   Noted Xarelto --> Apixaban due to renal function  Outreach:  Successful telephone call with Mr. Tuller.  HIPAA identifiers verified.   Subjective:  Patient agreeable to review his medications.  He reports his recent falls were due to baclofen and has not had any falls since stopping this medication.  He reports he has a roommate that helps him with splitting pills and organizing his medications.   Patient reports he has not picked up Santyl due to cost.  He states he picked up a 30 day supply of Eliquis but does not think he can afford it after this month.    Patient reports he only has Medicare A/B and does not have Medicare Part D.    Patient reports he does not currently monitor his blood sugars but is familiar with how to use a glucometer.  He reports he is going to discuss this with his new PCP that he will see on Thursday.   Patient reports that a nurse and social worker Janace Hoard) came to visit him today and are going to work on getting him a new scale, glucometer, and patient assistance for Eliquis.   Objective: Lab Results  Component Value Date   CREATININE 2.67 (H) 05/29/2018   CREATININE 2.85 (H) 05/28/2018   CREATININE 2.81 (H) 05/26/2018    Lab Results  Component Value Date   HGBA1C 7.9 (H) 05/25/2018    Lipid Panel  No results found for:  CHOL, TRIG, HDL, CHOLHDL, VLDL, LDLCALC, LDLDIRECT  BP Readings from Last 3 Encounters:  05/29/18 136/78  01/05/18 (!) 148/101  12/25/17 118/69    Allergies  Allergen Reactions  . Ibuprofen Other (See Comments)    Heart problems  . Baclofen Other (See Comments)  . Metformin Diarrhea  . Nsaids     Due to kidney and heart problems per pt    Medications Reviewed Today    Reviewed by Daune Perch, CPhT (Pharmacy Technician) on 05/25/18 at [place] List Status: Complete  Medication Order Taking? Sig Documenting Provider Last Dose Status Informant  aspirin EC 81 MG tablet [id number] Yes Take 1 tablet (81 mg total) by mouth daily. Theora Gianotti, NP 05/24/2018 Unknown time Active Self  carvedilol (COREG) 12.5 MG tablet [id number] Yes Take 12.5 mg by mouth 2 (two) times daily.  [provider] 05/24/2018 Unknown time Active Self  diphenhydramine-acetaminophen (TYLENOL PM EXTRA STRENGTH) 25-500 MG TABS tablet [id number] Yes Take 2 tablets by mouth at bedtime as needed (mild pain). [provider] prn prn Active Self  gabapentin (NEURONTIN) 400 MG capsule [id number] Yes Take 400 mg by mouth 3 (three) times daily.  [provider] 05/24/2018 Unknown time Active Self  insulin glargine (LANTUS) 100 unit/mL SOPN [id number] Yes Inject 15 Units into the skin 2 (two) times daily.  [provider] 05/24/2018 Unknown time Active Self  oxyCODONE-acetaminophen (PERCOCET/ROXICET) 5-325 MG tablet [id number] Yes Take 1  tablet by mouth every 4 (four) hours as needed for severe pain. Tegeler, Gwenyth Allegra, MD prn prn Active Self  Rivaroxaban (XARELTO) 15 MG TABS tablet [id number] Yes Take 15 mg by mouth daily with supper.  [provider] Past Week Unknown time Active Self           Med Note Vilma Prader, Hollice Gong May 25, 2018 12:55 AM) Pt has not had medication since weds due to them being OOS  senna-docusate (SENOKOT-S) 8.6-50 MG tablet [id number] Yes Take 2  tablets by mouth at bedtime.  [provider] 05/24/2018 Unknown time Active Self          Assessment: Date Discharged from Hospital: 05/29/2018 Date Medication Reconciliation Performed: 06/06/2018  Medications:  New at Discharge: . acetaminophen . allopurinol . augmentin . apixaban . collagenase . doxycycline . oxycodone . potassium . prednisone . torsemide  Adjustments at Discharge: . carvedilol . insulin glargine  Discontinued at Discharge:   oxycodone-acetaminophen  rivaroxaban  senna-docuasate  acetaminophen PM extra strength  Patient was recently discharged from hospital and all medications have been reviewed.  Drugs sorted by system:  Neurologic/Psychologic:gabapentin  Cardiovascular: carvedilol, torsemide  Hematologic: apixaban, aspirin 77m  Endocrine: insulin glargine, prednisone  Topical:santyl  Pain:acetaminophen, oxycodone  Infectious Diseases:amoxicillin-clavulanate, doxycycline  Vitamins/Minerals/Supplements:potassium  Miscellaneous:allopurinol  Medication Review Findings:  . Gabapentin: taking once daily rather than TID . Not checking blood sugars, no glucometer in home . MD appt on Wed (Heart Failure clinic) + Thurs (new PCP Dr. JRosario Jacks   Medication Assistance Findings:  Medication assistance needs identified.   -Apixaban -Santyl  Extra Help:   _0  Already receiving Full Extra Help  _1  Already receiving Partial Extra Help  _2  Eligible based on reported income and assets (however patient does not have Medicare Part D yet)  _3  Not Eligible based on reported income and assets  Patient Assistance Programs: 1) Eliquis made by BMS o Income requirement met: _4  Yes _5  No _6  Unknown o Out-of-pocket prescription expenditure met:    _7  Yes _8  No  _9  Unknown  <<LZJQBHALPFXTKWIO>_9<\/BDZHGDJMEQASTMHD>_62 Not applicable  - May apply if no Medicare Part D        2)  Santyl made by Smith&Nephew o Income requirement met: _11  Yes _12  No  _13  Unknown o Out-of-pocket  prescription expenditure met:   _14  Yes _15  No   _16  Unknown <<IWLNLGXQJJHERDEY>_8<\/XKGYJEHUDJSHFWYO>_37 Not applicable  - May apply if no Medicare Part D, co-pay card will cover cost over $50 up to $250 / fill.    Additional medication assistance options reviewed with patient as warranted:  [employer]are currently closed.   Patient eligible for Extra Help if he is going to have Medicare Part D coverage or may apply for patient assistance programs if he is NOT going to have Medicare Part D.  I provided patient with phone number for [facility] for more information on whether to move forward with Medicare Part D.  Patient voiced understanding and states he will call SHIIP this week.    Plan: I will f/u with patient on Thursday after he has his office visits with cardiology and new PCP to further discuss glucometer and medication assistance plans for Apixaban and Santyl.   CRalene Bathe PharmD, [phone]

## 2018-06-07 ENCOUNTER — Encounter: Payer: Self-pay | Admitting: Pharmacist

## 2018-06-07 NOTE — Progress Notes (Signed)
Patient ID: Paul Ala., male    DOB: 1966/04/29, 53 y.o.   MRN: [id number]  HPI  Mr Fuson is a 53 y/o male with a history of CAD, DM, HTN, CKD, stroke, obstructive sleep apnea, MI, PVD, anemia and chronic heart failure.   Echo report from 05/25/2018 reviewed and showed an EF of 25-30% along with mild MR, mild/moderate TR and a mildly elevated PA pressure of 43 mmHg. Left pleural effusion was also noted.   Admitted 05/24/2018 due to bilateral heel ulcerations with black eschar bilaterally along with acute on chronic HF. Podiatry and wound care consults obtained. Given antibiotics and dressing changes. Given prednisone taper due to gout. Initially given IV lasix and then transitioned to oral diuretics. CT scan showed chronic gout in feet. Unable to give ACE-I or spironolactone due to renal insufficiency. Discharged after 5 days.   He presents today for his initial visit with a chief complaint of minimal fatigue upon moderate exertion. He describes this as chronic in nature having been present for several months. He has associated pedal edema and chronic pain along with this. He denies any difficulty sleeping, dizziness, abdominal distention, palpitations, chest pain, shortness of breath, cough or weight gain. He's not weighing himself as he doesn't have any scales.   Past Medical History:  Diagnosis Date  . Anemia   . Cardiac defibrillator in place    a. Biotronik LUmax 540 DRT, (ser # [id number]).  . Carotid arterial disease ([place])    a. s/p prior LICA stenting;  b. 0/1779 Carotid U/S: 40-59% bilat ICA stenosis. Patent LICA stent.  . CHF (congestive heart failure) ([place])   . CKD (chronic kidney disease), stage III ([place])   . Coronary artery disease    a. 2010 s/p CABG x 3.  . DDD (degenerative disc disease), lumbosacral    L5-S1  . Diabetes ([place])    Lantus at bedtime  . Gangrene of toe of right foot ([place])   . Gout of left hand 10/06/2016  . HFrEF (heart failure with reduced ejection  fraction) ([place])    a. 07/2016 Echo: EF 25-30%, diff HK, mild MR, mildly dil LA, mod reduced RV fxn, PASP 28mmHg.  Marland Kitchen Hypertension    takes Coreg daily  . Ischemic cardiomyopathy    a. 07/2016 Echo: EF 25-30%, diff HK.  . Myocardial infarction ([place]) 2009  . Peripheral vascular disease ([place])   . Sleep apnea    sleep study yr ago-unable to afford cpap  . Stroke [facility]) 09   no weakness   Past Surgical History:  Procedure Laterality Date  . AMPUTATION TOE Right 08/22/2016   Procedure: AMPUTATION TOE;  Surgeon: Samara Deist, DPM;  Location: [facility] OR[facility];  Service: Podiatry;  Laterality: Right;  . AMPUTATION TOE Right 10/09/2016   Procedure: AMPUTATION TOE-RIGHT 2ND MPJ;  Surgeon: Samara Deist, DPM;  Location: [facility];  Service: Podiatry;  Laterality: Right;  . APLIGRAFT PLACEMENT Right 10/09/2016   Procedure: APLIGRAFT PLACEMENT;  Surgeon: Samara Deist, DPM;  Location: [facility];  Service: Podiatry;  Laterality: Right;  . CARDIAC DEFIBRILLATOR PLACEMENT  2011  . CAROTID ENDARTERECTOMY Left   . CHOLECYSTECTOMY  2010  . CORONARY ARTERY BYPASS GRAFT  2010   CABG x 3   . GRAFT APPLICATION Right 3/90/3009   Procedure: FULL THICKNESS SKIN GRAFT-RIGHT FOOT;  Surgeon: Algernon Huxley, MD;  Location: [facility];  Service: Vascular;  Laterality: Right;  . HIP SURGERY Right 1994  . INCISION AND DRAINAGE OF WOUND  Right 08/22/2016   Procedure: IRRIGATION AND DEBRIDEMENT WOUND and wound vac placement;  Surgeon: Samara Deist, DPM;  Location: [facility];  Service: Podiatry;  Laterality: Right;  . LOWER EXTREMITY ANGIOGRAPHY Right 08/24/2016   Procedure: Lower Extremity Angiography;  Surgeon: Algernon Huxley, MD;  Location: [place] [facility];  Service: Cardiovascular;  Laterality: Right;  . MASS EXCISION Right 07/18/2014   Procedure: EXCISION HETEROTOPIC BONE RIGHT HIP;  Surgeon: Frederik Pear, MD;  Location: [place];  Service: Orthopedics;  Laterality: Right;  . Open Heart Surgery  2010   x 3  . WOUND DEBRIDEMENT Right  10/09/2016   Procedure: DEBRIDEMENT WOUND;  Surgeon: Samara Deist, DPM;  Location: [facility];  Service: Podiatry;  Laterality: Right;   Family History  Problem Relation Age of Onset  . Hypertension Other   . Diabetes Other   . Diabetes Father   . Hyperlipidemia Father   . Hypertension Father   . Hyperlipidemia Sister   . Hypertension Sister   . Diabetes Sister   . Diabetes Brother   . Hypertension Brother   . Hyperlipidemia Brother    Social History   Tobacco Use  . Smoking status: Never Smoker  . Smokeless tobacco: Never Used  Substance Use Topics  . Alcohol use: No   Allergies  Allergen Reactions  . Ibuprofen Other (See Comments)    Heart problems  . Baclofen Other (See Comments)  . Metformin Diarrhea  . Nsaids     Due to kidney and heart problems per pt   Prior to Admission medications   Medication Sig Start Date End Date Taking? Authorizing Provider  acetaminophen (TYLENOL) 325 MG tablet Take 2 tablets (650 mg total) by mouth every 6 (six) hours as needed for mild pain (or Fever >/= 101). 05/29/18  Yes Wieting, [name], MD  allopurinol (ZYLOPRIM) 100 MG tablet Take 0.5 tablets (50 mg total) by mouth daily. 05/30/18  Yes Wieting, [name], MD  apixaban (ELIQUIS) 5 MG TABS tablet Take 1 tablet (5 mg total) by mouth 2 (two) times daily. 05/29/18  Yes Wieting, [name], MD  aspirin EC 81 MG tablet Take 1 tablet (81 mg total) by mouth daily. 10/21/16  Yes Theora Gianotti, NP  carvedilol (COREG) 3.125 MG tablet Take 1 tablet (3.125 mg total) by mouth 2 (two) times daily. 05/29/18  Yes Wieting, [name], MD  gabapentin (NEURONTIN) 400 MG capsule Take 400 mg by mouth 3 (three) times daily.  03/03/18  Yes [provider]  insulin glargine (LANTUS) 100 unit/mL SOPN Inject 0.2 mLs (20 Units total) into the skin at bedtime. 05/29/18  Yes Wieting, [name], MD  oxyCODONE 10 MG TABS Take 1 tablet (10 mg total) by mouth every 6 (six) hours as needed for moderate pain. 05/29/18   Yes Wieting, [name], MD  potassium chloride SA (K-DUR,KLOR-CON) 20 MEQ tablet Take 1 tablet (20 mEq total) by mouth daily. 05/30/18  Yes Loletha Grayer, MD  predniSONE (DELTASONE) 5 MG tablet 3 tabs po daily for three days then 2 tabs po daily for three days then one tab po daily afterwards 05/29/18  Yes Wieting, [name], MD  torsemide (DEMADEX) 20 MG tablet Take 2 tablets (40 mg total) by mouth 2 (two) times daily. 05/29/18  Yes Loletha Grayer, MD    Review of Systems  Constitutional: Positive for fatigue ("sometimes"). Negative for appetite change.  HENT: Positive for rhinorrhea. Negative for congestion and sore throat.   Eyes: Negative.   Respiratory: Negative for cough and shortness of breath.  Cardiovascular: Positive for leg swelling. Negative for chest pain and palpitations.  Gastrointestinal: Negative for abdominal distention and abdominal pain.  Endocrine: Negative.   Genitourinary: Negative.   Musculoskeletal: Positive for arthralgias (right hand), back pain and neck pain.  Allergic/Immunologic: Negative.   Neurological: Negative for dizziness and light-headedness.  Hematological: Negative for adenopathy. Does not bruise/bleed easily.  Psychiatric/Behavioral: Negative for dysphoric mood and sleep disturbance (sleeping flat). The patient is not nervous/anxious.    Vitals:   06/08/18 0940  BP: 127/70  Pulse: 86  Resp: 18  SpO2: 97%  Weight: 222 lb 2 oz (100.8 kg)  Height: 5\' 9"  (1.753 m)   Wt Readings from Last 3 Encounters:  06/08/18 222 lb 2 oz (100.8 kg)  05/27/18 235 lb 14.3 oz (107 kg)  01/05/18 210 lb (95.3 kg)   Lab Results  Component Value Date   CREATININE 2.67 (H) 05/29/2018   CREATININE 2.85 (H) 05/28/2018   CREATININE 2.81 (H) 05/26/2018    Physical Exam Vitals signs and nursing note reviewed.  Constitutional:      Appearance: Normal appearance.  HENT:     Head: Normocephalic and atraumatic.  Neck:     Musculoskeletal: Normal range of motion and  neck supple.  Cardiovascular:     Rate and Rhythm: Normal rate and regular rhythm.  Pulmonary:     Effort: Pulmonary effort is normal. No respiratory distress.     Breath sounds: No wheezing or rales.  Abdominal:     General: There is no distension.     Palpations: Abdomen is soft.  Musculoskeletal:        General: No tenderness.     Right lower leg: Edema (trace pitting) present.     Left lower leg: Edema (trace pitting) present.  Skin:    General: Skin is warm and dry.  Neurological:     General: No focal deficit present.     Mental Status: He is alert and oriented to person, place, and time.  Psychiatric:        Mood and Affect: Mood normal.        Behavior: Behavior normal.     Assessment & Plan:  1: Chronic heart failure with reduced ejection fraction- - NYHA class II - euvolemic today - not weighing daily; set of scales given to patient and he was instructed to weigh daily and call for an overnight weight gain of >2 pounds or a weekly weight gain of > 5 pounds - not adding salt but hasn't been reading food labels much; reviewed the importance of reading food labels so that he can keep his daily sodium intake to 2000mg . Written dietary information was given to him about this.  - saw cardiology Rosanna Randy) 04/18/18 - unable to use ACE-I / spironolactone due to renal dysfunction - consider titrating up carvedilol at future visits - does notice edema as the day progresses but says that it resolves overnight after he's been in bed - does elevate his legs during the day as he's most comfortable in the bed due to his chronic back/ neck pain - AICD implanted 2011 with upgrade to CRT-D 06/28/17 - BNP 05/25/2018 was 129.0 - patient says that he doesn't get the flu vaccine; emphasized good handwashing - Pharm D reconciled medications with the patient  2: HTN- - BP looks good today - BMP 05/29/2018 reviewed and showed sodium 135, potassium 4.6, creatinine 2.67 and GFR 30 - saw PCP  ([place]) 12/23/17  3: DM- - A1c 05/25/2018 was 7.9% -  nonfasting glucose in clinic today 285 (drank OJ this morning) - currently does not have a glucometer - saw nephrology ([place]) 04/22/18  Patient did not bring his medications nor a list. Each medication was verbally reviewed with the patient and he was encouraged to bring the bottles to every visit to confirm accuracy of list.  Return in 6 weeks or sooner for any questions/problems before then.

## 2018-06-08 ENCOUNTER — Ambulatory Visit: Payer: Medicare Other | Attending: Family | Admitting: Family

## 2018-06-08 ENCOUNTER — Encounter: Payer: Self-pay | Admitting: Family

## 2018-06-08 ENCOUNTER — Other Ambulatory Visit: Payer: Self-pay

## 2018-06-08 VITALS — BP 127/70 | HR 86 | Resp 18 | Ht 69.0 in | Wt 222.1 lb

## 2018-06-08 DIAGNOSIS — M109 Gout, unspecified: Secondary | ICD-10-CM | POA: Diagnosis not present

## 2018-06-08 DIAGNOSIS — I252 Old myocardial infarction: Secondary | ICD-10-CM | POA: Insufficient documentation

## 2018-06-08 DIAGNOSIS — I130 Hypertensive heart and chronic kidney disease with heart failure and stage 1 through stage 4 chronic kidney disease, or unspecified chronic kidney disease: Secondary | ICD-10-CM | POA: Diagnosis not present

## 2018-06-08 DIAGNOSIS — D649 Anemia, unspecified: Secondary | ICD-10-CM | POA: Insufficient documentation

## 2018-06-08 DIAGNOSIS — Z95810 Presence of automatic (implantable) cardiac defibrillator: Secondary | ICD-10-CM | POA: Diagnosis not present

## 2018-06-08 DIAGNOSIS — J90 Pleural effusion, not elsewhere classified: Secondary | ICD-10-CM | POA: Diagnosis not present

## 2018-06-08 DIAGNOSIS — G4733 Obstructive sleep apnea (adult) (pediatric): Secondary | ICD-10-CM | POA: Diagnosis not present

## 2018-06-08 DIAGNOSIS — N183 Chronic kidney disease, stage 3 (moderate): Secondary | ICD-10-CM | POA: Insufficient documentation

## 2018-06-08 DIAGNOSIS — E11621 Type 2 diabetes mellitus with foot ulcer: Secondary | ICD-10-CM

## 2018-06-08 DIAGNOSIS — Z8249 Family history of ischemic heart disease and other diseases of the circulatory system: Secondary | ICD-10-CM | POA: Insufficient documentation

## 2018-06-08 DIAGNOSIS — M542 Cervicalgia: Secondary | ICD-10-CM | POA: Insufficient documentation

## 2018-06-08 DIAGNOSIS — M549 Dorsalgia, unspecified: Secondary | ICD-10-CM | POA: Insufficient documentation

## 2018-06-08 DIAGNOSIS — Z7901 Long term (current) use of anticoagulants: Secondary | ICD-10-CM | POA: Diagnosis not present

## 2018-06-08 DIAGNOSIS — Z951 Presence of aortocoronary bypass graft: Secondary | ICD-10-CM | POA: Diagnosis not present

## 2018-06-08 DIAGNOSIS — I5022 Chronic systolic (congestive) heart failure: Secondary | ICD-10-CM | POA: Diagnosis not present

## 2018-06-08 DIAGNOSIS — G8929 Other chronic pain: Secondary | ICD-10-CM | POA: Diagnosis not present

## 2018-06-08 DIAGNOSIS — L89620 Pressure ulcer of left heel, unstageable: Secondary | ICD-10-CM | POA: Diagnosis not present

## 2018-06-08 DIAGNOSIS — Z79899 Other long term (current) drug therapy: Secondary | ICD-10-CM | POA: Diagnosis not present

## 2018-06-08 DIAGNOSIS — L97509 Non-pressure chronic ulcer of other part of unspecified foot with unspecified severity: Secondary | ICD-10-CM

## 2018-06-08 DIAGNOSIS — E1151 Type 2 diabetes mellitus with diabetic peripheral angiopathy without gangrene: Secondary | ICD-10-CM | POA: Diagnosis not present

## 2018-06-08 DIAGNOSIS — R5383 Other fatigue: Secondary | ICD-10-CM | POA: Diagnosis present

## 2018-06-08 DIAGNOSIS — I255 Ischemic cardiomyopathy: Secondary | ICD-10-CM | POA: Insufficient documentation

## 2018-06-08 DIAGNOSIS — Z8673 Personal history of transient ischemic attack (TIA), and cerebral infarction without residual deficits: Secondary | ICD-10-CM | POA: Diagnosis not present

## 2018-06-08 DIAGNOSIS — Z794 Long term (current) use of insulin: Secondary | ICD-10-CM | POA: Insufficient documentation

## 2018-06-08 DIAGNOSIS — E1122 Type 2 diabetes mellitus with diabetic chronic kidney disease: Secondary | ICD-10-CM | POA: Diagnosis not present

## 2018-06-08 DIAGNOSIS — L89610 Pressure ulcer of right heel, unstageable: Secondary | ICD-10-CM | POA: Diagnosis not present

## 2018-06-08 DIAGNOSIS — Z7982 Long term (current) use of aspirin: Secondary | ICD-10-CM | POA: Insufficient documentation

## 2018-06-08 DIAGNOSIS — I10 Essential (primary) hypertension: Secondary | ICD-10-CM

## 2018-06-08 DIAGNOSIS — I5023 Acute on chronic systolic (congestive) heart failure: Secondary | ICD-10-CM | POA: Diagnosis not present

## 2018-06-08 DIAGNOSIS — I2510 Atherosclerotic heart disease of native coronary artery without angina pectoris: Secondary | ICD-10-CM | POA: Diagnosis not present

## 2018-06-08 LAB — GLUCOSE, CAPILLARY: Glucose-Capillary: 285 mg/dL — ABNORMAL HIGH (ref 70–99)

## 2018-06-08 NOTE — Progress Notes (Signed)
[place] - PHARMACIST COUNSELING NOTE   ASSESSMENT   Patient presents with no specific complaints today.  Patient primary concern today is problems with his hand and driving, which he wants to continue to address with his hand specialist.  Pt is finishing a course of antibiotics today for infection.   Adherence assessment   Patient is using routine as an adherence strategy.  He seems to be compliant with his meds, but was unable to pick up his potassium at his previous visit to the pharmacy.     Do you ever forget to take your medication? [] Yes (1) [x] No (0)  Do you ever skip doses due to side effects? [] Yes (1) [x] No (0)  Do you have trouble affording your medicines? [x] Yes (1) [] No (0)  Are you ever unable to pick up your medication due to transportation difficulties? [x] Yes (1) [] No (0)  Do you ever stop taking your medications because you don't believe they are helping? [] Yes (1) [x] No (0)  Total score _1______      Guideline-Directed Medical Therapy/Evidence Based Medicine   ACE/ARB/ARNI: no - previous syncope on lisinopril x 2 - Scr elevated ~ 2.5 - CKD3   Beta Blocker: yes   Aldosterone Antagonist: no Diuretic: yes   Drug related problem 1:  Potassium not picked up in previous pharmacy visit - pt has financial and transportation issues   Drug related problem 2:  Patient not currently weighing himself daily to assess fluid accumulation   Drug related problem 3:  Ejection fraction < 40 and no current ACE/ARB therapy   Drug related problem 4:  Beta blocker therapy currently not at goal   PLAN   Drug related problem 1:  Pt was counseled on the importance of taking potassium every day with his torsemide.  Pt was counseled on the potential problems that could result from low potassium levels. Patient agreed to prioritize picking up and restarting his daily potassium replacement.   Drug related problem 2:   Patient was provided  a scale and given strategies to remember a daily weigh-in, including writing a visual reminder on his morning medicine.   Drug related problem 3:  Pt history and current Scr/CrCl probably do not support a trial of ACE/ARB at this time - reassess at next visit and consider adding at a low dose   Drug related problem 4:  Considering increasing Carvedilol from 3.125mg  bid to 6.25mg  bid per NP recommendation (tabled until next visit)   SUBJECTIVE   HPI:  Pt was counseled on several factors of treating his heart failure, including the benefits of increased beta blocker dose and potentially adding ACE/ARB at a low dose in the future to help change the shape and thickness of the heart to improve overall function   Past Medical History:  Diagnosis Date  . Anemia   . Cardiac defibrillator in place    a. Biotronik LUmax 540 DRT, (ser # [id number]).  . Carotid arterial disease ([place])    a. s/p prior LICA stenting;  b. 05/7709 Carotid U/S: 40-59% bilat ICA stenosis. Patent LICA stent.  . CKD (chronic kidney disease), stage III ([place])   . Coronary artery disease    a. 2010 s/p CABG x 3.  . DDD (degenerative disc disease), lumbosacral    L5-S1  . Diabetes ([place])    Lantus at bedtime  . Gangrene of toe of right foot ([place])   . Gout of left hand 10/06/2016  . HFrEF (heart  failure with reduced ejection fraction) ([place])    a. 07/2016 Echo: EF 25-30%, diff HK, mild MR, mildly dil LA, mod reduced RV fxn, PASP 93mmHg.  Marland Kitchen Hypertension    takes Coreg daily  . Ischemic cardiomyopathy    a. 07/2016 Echo: EF 25-30%, diff HK.  . Myocardial infarction ([place]) 2009  . Peripheral vascular disease ([place])   . Sleep apnea    sleep study yr ago-unable to afford cpap  . Stroke [facility]) 09   no weakness      Current Outpatient Medications:  .  acetaminophen (TYLENOL) 325 MG tablet, Take 2 tablets (650 mg total) by mouth every 6 (six) hours as needed for mild pain (or Fever >/= 101)., Disp: , Rfl:  .  allopurinol (ZYLOPRIM)  100 MG tablet, Take 0.5 tablets (50 mg total) by mouth daily., Disp: 30 tablet, Rfl: 0 .  apixaban (ELIQUIS) 5 MG TABS tablet, Take 1 tablet (5 mg total) by mouth 2 (two) times daily., Disp: 60 tablet, Rfl: 0 .  aspirin EC 81 MG tablet, Take 1 tablet (81 mg total) by mouth daily., Disp: 90 tablet, Rfl: 3 .  carvedilol (COREG) 3.125 MG tablet, Take 1 tablet (3.125 mg total) by mouth 2 (two) times daily., Disp: 60 tablet, Rfl: 0 .  gabapentin (NEURONTIN) 400 MG capsule, Take 400 mg by mouth 3 (three) times daily. , Disp: , Rfl:  .  insulin glargine (LANTUS) 100 unit/mL SOPN, Inject 0.2 mLs (20 Units total) into the skin at bedtime., Disp: 6 mL, Rfl: 0 .  oxyCODONE 10 MG TABS, Take 1 tablet (10 mg total) by mouth every 6 (six) hours as needed for moderate pain., Disp: 20 tablet, Rfl: 0 .  potassium chloride SA (K-DUR,KLOR-CON) 20 MEQ tablet, Take 1 tablet (20 mEq total) by mouth daily., Disp: 30 tablet, Rfl: 0 .  predniSONE (DELTASONE) 5 MG tablet, 3 tabs po daily for three days then 2 tabs po daily for three days then one tab po daily afterwards, Disp: 40 tablet, Rfl: 0 .  torsemide (DEMADEX) 20 MG tablet, Take 2 tablets (40 mg total) by mouth 2 (two) times daily., Disp: 60 tablet, Rfl: 0    OBJECTIVE    BMP Latest Ref Rng & Units 05/29/2018 05/28/2018 05/26/2018  Glucose 70 - 99 mg/dL 246(H) 247(H) 70  BUN 6 - 20 mg/dL 73(H) 72(H) 76(H)  Creatinine 0.61 - 1.24 mg/dL 2.67(H) 2.85(H) 2.81(H)  BUN/Creat Ratio 9 - 20 - - -  Sodium 135 - 145 mmol/L 135 137 138  Potassium 3.5 - 5.1 mmol/L 4.6 4.3 3.6  Chloride 98 - 111 mmol/L 100 100 104  CO2 22 - 32 mmol/L 26 28 26   Calcium 8.9 - 10.3 mg/dL 8.5(L) 8.3(L) 8.2(L)    Vital signs: HR 86, BP 127/70, weight (pounds) 222.2   ECHO: Date 05/24/2018, EF 25-30, notes   Diffuse hypokinesis. Akinesis o the apical myocardium. Hypokinesis of the mid to apical anterio and anteroseptal myocardium. Doppler parameters are consistent with abnormal left ventricular  relaxation (grade 1 diastolicdysfunction).  Mild to moderate TV regurgitation.   Cath: Date N/A, EF N/A, notes N/A   DRUGS TO AVOID IN HEART FAILURE  Drug or Class Mechanism  Analgesics . NSAIDs . COX-2 inhibitors . Glucocorticoids  Sodium and water retention, increased systemic vascular resistance, decreased response to diuretics   Diabetes Medications . Metformin . Thiazolidinediones o Rosiglitazone (Avandia) o Pioglitazone (Actos) . DPP4 Inhibitors o Saxagliptin (Onglyza) o Sitagliptin (Januvia)   Lactic acidosis Possible calcium  channel blockade   Unknown  Antiarrhythmics . Class I  o Flecainide o Disopyramide . Class III o Sotalol . Other o Dronedarone  Negative inotrope, proarrhythmic   Proarrhythmic, beta blockade  Negative inotrope  Antihypertensives . Alpha Blockers o Doxazosin . Calcium Channel Blockers o Diltiazem o Verapamil o Nifedipine . Central Alpha Adrenergics o Moxonidine . Peripheral Vasodilators o Minoxidil  Increases renin and aldosterone  Negative inotrope    Possible sympathetic withdrawal  Unknown  Anti-infective . Itraconazole . Amphotericin B  Negative inotrope Unknown  Hematologic . Anagrelide . Cilostazol   Possible inhibition of PD IV Inhibition of PD III causing arrhythmias  Neurologic/Psychiatric . Stimulants . Anti-Seizure Drugs o Carbamazepine o Pregabalin . Antidepressants o Tricyclics o Citalopram . Parkinsons o Bromocriptine o Pergolide o Pramipexole . Antipsychotics o Clozapine . Antimigraine o Ergotamine o Methysergide . Appetite suppressants . Bipolar o Lithium  Peripheral alpha and beta agonist activity  Negative inotrope and chronotrope Calcium channel blockade  Negative inotrope, proarrhythmic Dose-dependent QT prolongation  Excessive serotonin activity/valvular damage Excessive serotonin activity/valvular damage Unknown  IgE mediated hypersensitivy, calcium channel  blockade  Excessive serotonin activity/valvular damage Excessive serotonin activity/valvular damage Valvular damage  Direct myofibrillar degeneration, adrenergic stimulation  Antimalarials . Chloroquine . Hydroxychloroquine Intracellular inhibition of lysosomal enzymes  Urologic Agents . Alpha Blockers o Doxazosin o Prazosin o Tamsulosin o Terazosin  Increased renin and aldosterone  Adapted from Page RL, et al. "Drugs That May Cause or Exacerbate Heart Failure: A Scientific Statement from the [place]." Circulation 2016; 923:R00-T62. DOI: 10.1161/CIR.0000000000000426   COUNSELING POINTS/CLINICAL PEARLS  Carvedilol (Goal: weight less than 85 kg is 25 mg BID, weight greater than 85 kg is 50 mg BID)  Patient should avoid activities requiring coordination until drug effects are realized, as drug may cause dizziness.  This drug may cause diarrhea, nausea, vomiting, arthralgia, back pain, myalgia, headache, vision disorder, erectile dysfunction, reduced libido, or fatigue.  Instruct patient to report signs/symptoms of adverse cardiovascular effects such as hypotension (especially in elderly patients), arrhythmias, syncope, palpitations, angina, or edema.  Drug may mask symptoms of hypoglycemia. Advise diabetic patients to carefully monitor blood sugar levels.  Patient should take drug with food.  Advise patient against sudden discontinuation of drug.  Torsemide  Side effects may include excessive urination.  Tell patient to report symptoms of ototoxicity.  Instruct patient to report lightheadedness or syncope.  Warn patient to avoid use of nonprescription NSAID products without first discussing it with their healthcare provider.  MEDICATION ADHERENCES TIPS AND STRATEGIES 1. Taking medication as prescribed improves patient outcomes in heart failure (reduces hospitalizations, improves symptoms, increases survival) 2. Side effects of medications can be managed by  decreasing doses, switching agents, stopping drugs, or adding additional therapy. Please let someone in the [facility] know if you have having bothersome side effects so we can modify your regimen. Do not alter your medication regimen without talking to [place].  3. Medication reminders can help patients remember to take drugs on time. If you are missing or forgetting doses you can try linking behaviors, using pill boxes, or an electronic reminder like an alarm on your phone or an app. Some people can also get automated phone calls as medication reminders.   Time spent: 10 minutes  Lu Duffel, Pharm.D. Clinical Pharmacist 06/08/2018 9:49 AM

## 2018-06-08 NOTE — Patient Instructions (Signed)
Continue weighing daily and call for an overnight weight gain of > 2 pounds or a weekly weight gain of >5 pounds. 

## 2018-06-10 DIAGNOSIS — N183 Chronic kidney disease, stage 3 (moderate): Secondary | ICD-10-CM | POA: Diagnosis not present

## 2018-06-10 DIAGNOSIS — L89620 Pressure ulcer of left heel, unstageable: Secondary | ICD-10-CM | POA: Diagnosis not present

## 2018-06-10 DIAGNOSIS — E1122 Type 2 diabetes mellitus with diabetic chronic kidney disease: Secondary | ICD-10-CM | POA: Diagnosis not present

## 2018-06-10 DIAGNOSIS — I5023 Acute on chronic systolic (congestive) heart failure: Secondary | ICD-10-CM | POA: Diagnosis not present

## 2018-06-10 DIAGNOSIS — I130 Hypertensive heart and chronic kidney disease with heart failure and stage 1 through stage 4 chronic kidney disease, or unspecified chronic kidney disease: Secondary | ICD-10-CM | POA: Diagnosis not present

## 2018-06-10 DIAGNOSIS — L89610 Pressure ulcer of right heel, unstageable: Secondary | ICD-10-CM | POA: Diagnosis not present

## 2018-06-13 ENCOUNTER — Other Ambulatory Visit: Payer: Self-pay | Admitting: Pharmacist

## 2018-06-13 ENCOUNTER — Other Ambulatory Visit: Payer: Self-pay

## 2018-06-13 ENCOUNTER — Ambulatory Visit: Payer: Medicare Other | Admitting: Pharmacist

## 2018-06-13 ENCOUNTER — Other Ambulatory Visit: Payer: Self-pay | Admitting: *Deleted

## 2018-06-13 DIAGNOSIS — L89610 Pressure ulcer of right heel, unstageable: Secondary | ICD-10-CM | POA: Diagnosis not present

## 2018-06-13 DIAGNOSIS — N183 Chronic kidney disease, stage 3 (moderate): Secondary | ICD-10-CM | POA: Diagnosis not present

## 2018-06-13 DIAGNOSIS — L89620 Pressure ulcer of left heel, unstageable: Secondary | ICD-10-CM | POA: Diagnosis not present

## 2018-06-13 DIAGNOSIS — I130 Hypertensive heart and chronic kidney disease with heart failure and stage 1 through stage 4 chronic kidney disease, or unspecified chronic kidney disease: Secondary | ICD-10-CM | POA: Diagnosis not present

## 2018-06-13 DIAGNOSIS — I5023 Acute on chronic systolic (congestive) heart failure: Secondary | ICD-10-CM | POA: Diagnosis not present

## 2018-06-13 DIAGNOSIS — E1122 Type 2 diabetes mellitus with diabetic chronic kidney disease: Secondary | ICD-10-CM | POA: Diagnosis not present

## 2018-06-13 NOTE — Patient Outreach (Signed)
[facility]) Care Management  [place] 06/13/2018  Eamonn Sermeno. 1965-05-19 [id number]  Reason for call: medication assistance  Unsuccessful telephone call attempt #1 to patient to f/u on medication assistance and glucometer.  HIPAA compliant voicemail left requesting a return call  Plan:  I will make another outreach attempt to patient within 3-4 business days   Ralene Bathe, PharmD, [place] [phone]
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
## 2018-06-14 DIAGNOSIS — R5383 Other fatigue: Secondary | ICD-10-CM | POA: Diagnosis not present

## 2018-06-14 DIAGNOSIS — I10 Essential (primary) hypertension: Secondary | ICD-10-CM | POA: Diagnosis not present

## 2018-06-14 DIAGNOSIS — N289 Disorder of kidney and ureter, unspecified: Secondary | ICD-10-CM | POA: Diagnosis not present

## 2018-06-14 DIAGNOSIS — E785 Hyperlipidemia, unspecified: Secondary | ICD-10-CM | POA: Diagnosis not present

## 2018-06-14 DIAGNOSIS — I509 Heart failure, unspecified: Secondary | ICD-10-CM | POA: Diagnosis not present

## 2018-06-16 ENCOUNTER — Ambulatory Visit: Payer: Self-pay | Admitting: Pharmacist

## 2018-06-16 ENCOUNTER — Other Ambulatory Visit: Payer: Self-pay | Admitting: Pharmacist

## 2018-06-16 ENCOUNTER — Other Ambulatory Visit: Payer: Self-pay | Admitting: Pharmacy Technician

## 2018-06-16 DIAGNOSIS — L89620 Pressure ulcer of left heel, unstageable: Secondary | ICD-10-CM | POA: Diagnosis not present

## 2018-06-16 DIAGNOSIS — I130 Hypertensive heart and chronic kidney disease with heart failure and stage 1 through stage 4 chronic kidney disease, or unspecified chronic kidney disease: Secondary | ICD-10-CM | POA: Diagnosis not present

## 2018-06-16 DIAGNOSIS — L89610 Pressure ulcer of right heel, unstageable: Secondary | ICD-10-CM | POA: Diagnosis not present

## 2018-06-16 DIAGNOSIS — E1122 Type 2 diabetes mellitus with diabetic chronic kidney disease: Secondary | ICD-10-CM | POA: Diagnosis not present

## 2018-06-16 DIAGNOSIS — I5023 Acute on chronic systolic (congestive) heart failure: Secondary | ICD-10-CM | POA: Diagnosis not present

## 2018-06-16 DIAGNOSIS — N183 Chronic kidney disease, stage 3 (moderate): Secondary | ICD-10-CM | POA: Diagnosis not present

## 2018-06-16 NOTE — Patient Outreach (Addendum)
[facility]) Care Management  [place] 06/16/2018  Paul [name]. Dec 26, 1965 [id number]  Reason for call: f/u on medication management, medication assistance  Successful call attempt#2 to patient. HIPAA identifiers verified. Patient reports he left a message with [employer] to discuss Medicare Part D but has not received a return call yet.  Patient reports he is working with his home health social worker, Janace Hoard, regarding glucometer needs and that he has been provided a new machine + supplies this week.  He reports being able to use the glucometer and has no questions on it.    I reviewed again with patient options for applying for patient assistance program for Eliquis.  Patient willing to try to apply.  Confirmed home address.   Will also mail out copay assistance card for Santyl.    Plan: I will route patient assistance letter to [place] technician who will coordinate patient assistance program application process for medications listed above.  [facility] pharmacy technician will assist with obtaining all required documents from both patient and provider(s) and submit application(s) once completed.    Ralene Bathe, PharmD, [place] [phone]

## 2018-06-16 NOTE — Patient Outreach (Signed)
[facility]) Care Management  06/16/2018  Paul [name] Jul 23, 1965 [id number]                                                  Medication Assistance Referral  Referral From: [place]  Medication/Company: Eliquis / [place] Patient application portion:  Mailed Provider application portion: Faxed  to Dr. Rosario Jacks   Follow up:  Will follow up with patient in 5-7 business days to confirm application(s) have been received.  Maud Deed Chana Bode [place] Certified Pharmacy Technician [place] Management Direct Dial:[phone]

## 2018-06-17 DIAGNOSIS — L89620 Pressure ulcer of left heel, unstageable: Secondary | ICD-10-CM | POA: Diagnosis not present

## 2018-06-17 DIAGNOSIS — E1122 Type 2 diabetes mellitus with diabetic chronic kidney disease: Secondary | ICD-10-CM | POA: Diagnosis not present

## 2018-06-17 DIAGNOSIS — L89610 Pressure ulcer of right heel, unstageable: Secondary | ICD-10-CM | POA: Diagnosis not present

## 2018-06-17 DIAGNOSIS — I130 Hypertensive heart and chronic kidney disease with heart failure and stage 1 through stage 4 chronic kidney disease, or unspecified chronic kidney disease: Secondary | ICD-10-CM | POA: Diagnosis not present

## 2018-06-17 DIAGNOSIS — I5023 Acute on chronic systolic (congestive) heart failure: Secondary | ICD-10-CM | POA: Diagnosis not present

## 2018-06-17 DIAGNOSIS — N183 Chronic kidney disease, stage 3 (moderate): Secondary | ICD-10-CM | POA: Diagnosis not present

## 2018-06-20 DIAGNOSIS — I509 Heart failure, unspecified: Secondary | ICD-10-CM | POA: Diagnosis not present

## 2018-06-20 DIAGNOSIS — I130 Hypertensive heart and chronic kidney disease with heart failure and stage 1 through stage 4 chronic kidney disease, or unspecified chronic kidney disease: Secondary | ICD-10-CM | POA: Diagnosis not present

## 2018-06-20 DIAGNOSIS — E1165 Type 2 diabetes mellitus with hyperglycemia: Secondary | ICD-10-CM | POA: Diagnosis not present

## 2018-06-20 DIAGNOSIS — E1122 Type 2 diabetes mellitus with diabetic chronic kidney disease: Secondary | ICD-10-CM | POA: Diagnosis not present

## 2018-06-20 DIAGNOSIS — R262 Difficulty in walking, not elsewhere classified: Secondary | ICD-10-CM | POA: Diagnosis not present

## 2018-06-20 DIAGNOSIS — L89620 Pressure ulcer of left heel, unstageable: Secondary | ICD-10-CM | POA: Diagnosis not present

## 2018-06-20 DIAGNOSIS — N183 Chronic kidney disease, stage 3 (moderate): Secondary | ICD-10-CM | POA: Diagnosis not present

## 2018-06-20 DIAGNOSIS — I5023 Acute on chronic systolic (congestive) heart failure: Secondary | ICD-10-CM | POA: Diagnosis not present

## 2018-06-20 DIAGNOSIS — L89610 Pressure ulcer of right heel, unstageable: Secondary | ICD-10-CM | POA: Diagnosis not present

## 2018-06-21 DIAGNOSIS — L89620 Pressure ulcer of left heel, unstageable: Secondary | ICD-10-CM | POA: Diagnosis not present

## 2018-06-21 DIAGNOSIS — I5023 Acute on chronic systolic (congestive) heart failure: Secondary | ICD-10-CM | POA: Diagnosis not present

## 2018-06-21 DIAGNOSIS — E1122 Type 2 diabetes mellitus with diabetic chronic kidney disease: Secondary | ICD-10-CM | POA: Diagnosis not present

## 2018-06-21 DIAGNOSIS — I130 Hypertensive heart and chronic kidney disease with heart failure and stage 1 through stage 4 chronic kidney disease, or unspecified chronic kidney disease: Secondary | ICD-10-CM | POA: Diagnosis not present

## 2018-06-21 DIAGNOSIS — N183 Chronic kidney disease, stage 3 (moderate): Secondary | ICD-10-CM | POA: Diagnosis not present

## 2018-06-21 DIAGNOSIS — L89610 Pressure ulcer of right heel, unstageable: Secondary | ICD-10-CM | POA: Diagnosis not present

## 2018-06-22 DIAGNOSIS — L89620 Pressure ulcer of left heel, unstageable: Secondary | ICD-10-CM | POA: Diagnosis not present

## 2018-06-22 DIAGNOSIS — E559 Vitamin D deficiency, unspecified: Secondary | ICD-10-CM | POA: Diagnosis not present

## 2018-06-22 DIAGNOSIS — D509 Iron deficiency anemia, unspecified: Secondary | ICD-10-CM | POA: Diagnosis not present

## 2018-06-22 DIAGNOSIS — E1122 Type 2 diabetes mellitus with diabetic chronic kidney disease: Secondary | ICD-10-CM | POA: Diagnosis not present

## 2018-06-22 DIAGNOSIS — N183 Chronic kidney disease, stage 3 (moderate): Secondary | ICD-10-CM | POA: Diagnosis not present

## 2018-06-22 DIAGNOSIS — L89610 Pressure ulcer of right heel, unstageable: Secondary | ICD-10-CM | POA: Diagnosis not present

## 2018-06-22 DIAGNOSIS — N289 Disorder of kidney and ureter, unspecified: Secondary | ICD-10-CM | POA: Diagnosis not present

## 2018-06-22 DIAGNOSIS — E1165 Type 2 diabetes mellitus with hyperglycemia: Secondary | ICD-10-CM | POA: Diagnosis not present

## 2018-06-22 DIAGNOSIS — I130 Hypertensive heart and chronic kidney disease with heart failure and stage 1 through stage 4 chronic kidney disease, or unspecified chronic kidney disease: Secondary | ICD-10-CM | POA: Diagnosis not present

## 2018-06-22 DIAGNOSIS — I5023 Acute on chronic systolic (congestive) heart failure: Secondary | ICD-10-CM | POA: Diagnosis not present

## 2018-06-23 DIAGNOSIS — N183 Chronic kidney disease, stage 3 (moderate): Secondary | ICD-10-CM | POA: Diagnosis not present

## 2018-06-23 DIAGNOSIS — I130 Hypertensive heart and chronic kidney disease with heart failure and stage 1 through stage 4 chronic kidney disease, or unspecified chronic kidney disease: Secondary | ICD-10-CM | POA: Diagnosis not present

## 2018-06-23 DIAGNOSIS — L89620 Pressure ulcer of left heel, unstageable: Secondary | ICD-10-CM | POA: Diagnosis not present

## 2018-06-23 DIAGNOSIS — I5023 Acute on chronic systolic (congestive) heart failure: Secondary | ICD-10-CM | POA: Diagnosis not present

## 2018-06-23 DIAGNOSIS — E1122 Type 2 diabetes mellitus with diabetic chronic kidney disease: Secondary | ICD-10-CM | POA: Diagnosis not present

## 2018-06-23 DIAGNOSIS — L89610 Pressure ulcer of right heel, unstageable: Secondary | ICD-10-CM | POA: Diagnosis not present

## 2018-06-27 DIAGNOSIS — E1122 Type 2 diabetes mellitus with diabetic chronic kidney disease: Secondary | ICD-10-CM | POA: Diagnosis not present

## 2018-06-27 DIAGNOSIS — I130 Hypertensive heart and chronic kidney disease with heart failure and stage 1 through stage 4 chronic kidney disease, or unspecified chronic kidney disease: Secondary | ICD-10-CM | POA: Diagnosis not present

## 2018-06-27 DIAGNOSIS — N183 Chronic kidney disease, stage 3 (moderate): Secondary | ICD-10-CM | POA: Diagnosis not present

## 2018-06-27 DIAGNOSIS — L89610 Pressure ulcer of right heel, unstageable: Secondary | ICD-10-CM | POA: Diagnosis not present

## 2018-06-27 DIAGNOSIS — L89620 Pressure ulcer of left heel, unstageable: Secondary | ICD-10-CM | POA: Diagnosis not present

## 2018-06-27 DIAGNOSIS — I5023 Acute on chronic systolic (congestive) heart failure: Secondary | ICD-10-CM | POA: Diagnosis not present

## 2018-06-29 DIAGNOSIS — N183 Chronic kidney disease, stage 3 (moderate): Secondary | ICD-10-CM | POA: Diagnosis not present

## 2018-06-29 DIAGNOSIS — I130 Hypertensive heart and chronic kidney disease with heart failure and stage 1 through stage 4 chronic kidney disease, or unspecified chronic kidney disease: Secondary | ICD-10-CM | POA: Diagnosis not present

## 2018-06-29 DIAGNOSIS — L89610 Pressure ulcer of right heel, unstageable: Secondary | ICD-10-CM | POA: Diagnosis not present

## 2018-06-29 DIAGNOSIS — L89623 Pressure ulcer of left heel, stage 3: Secondary | ICD-10-CM | POA: Diagnosis not present

## 2018-06-29 DIAGNOSIS — E1142 Type 2 diabetes mellitus with diabetic polyneuropathy: Secondary | ICD-10-CM | POA: Diagnosis not present

## 2018-06-29 DIAGNOSIS — L89613 Pressure ulcer of right heel, stage 3: Secondary | ICD-10-CM | POA: Diagnosis not present

## 2018-06-29 DIAGNOSIS — L89620 Pressure ulcer of left heel, unstageable: Secondary | ICD-10-CM | POA: Diagnosis not present

## 2018-06-29 DIAGNOSIS — E1122 Type 2 diabetes mellitus with diabetic chronic kidney disease: Secondary | ICD-10-CM | POA: Diagnosis not present

## 2018-06-29 DIAGNOSIS — I5023 Acute on chronic systolic (congestive) heart failure: Secondary | ICD-10-CM | POA: Diagnosis not present

## 2018-06-30 DIAGNOSIS — Z95810 Presence of automatic (implantable) cardiac defibrillator: Secondary | ICD-10-CM | POA: Diagnosis not present

## 2018-06-30 DIAGNOSIS — Z79891 Long term (current) use of opiate analgesic: Secondary | ICD-10-CM | POA: Diagnosis not present

## 2018-06-30 DIAGNOSIS — Z91419 Personal history of unspecified adult abuse: Secondary | ICD-10-CM | POA: Diagnosis not present

## 2018-06-30 DIAGNOSIS — I5023 Acute on chronic systolic (congestive) heart failure: Secondary | ICD-10-CM | POA: Diagnosis not present

## 2018-06-30 DIAGNOSIS — Z79899 Other long term (current) drug therapy: Secondary | ICD-10-CM | POA: Diagnosis not present

## 2018-06-30 DIAGNOSIS — N183 Chronic kidney disease, stage 3 (moderate): Secondary | ICD-10-CM | POA: Diagnosis not present

## 2018-06-30 DIAGNOSIS — E1122 Type 2 diabetes mellitus with diabetic chronic kidney disease: Secondary | ICD-10-CM | POA: Diagnosis not present

## 2018-06-30 DIAGNOSIS — Z7982 Long term (current) use of aspirin: Secondary | ICD-10-CM | POA: Diagnosis not present

## 2018-06-30 DIAGNOSIS — I130 Hypertensive heart and chronic kidney disease with heart failure and stage 1 through stage 4 chronic kidney disease, or unspecified chronic kidney disease: Secondary | ICD-10-CM | POA: Diagnosis not present

## 2018-06-30 DIAGNOSIS — L89610 Pressure ulcer of right heel, unstageable: Secondary | ICD-10-CM | POA: Diagnosis not present

## 2018-06-30 DIAGNOSIS — L89620 Pressure ulcer of left heel, unstageable: Secondary | ICD-10-CM | POA: Diagnosis not present

## 2018-06-30 DIAGNOSIS — Z89421 Acquired absence of other right toe(s): Secondary | ICD-10-CM | POA: Diagnosis not present

## 2018-06-30 DIAGNOSIS — Z794 Long term (current) use of insulin: Secondary | ICD-10-CM | POA: Diagnosis not present

## 2018-06-30 DIAGNOSIS — E1140 Type 2 diabetes mellitus with diabetic neuropathy, unspecified: Secondary | ICD-10-CM | POA: Diagnosis not present

## 2018-06-30 DIAGNOSIS — Z9114 Patient's other noncompliance with medication regimen: Secondary | ICD-10-CM | POA: Diagnosis not present

## 2018-07-05 DIAGNOSIS — L89610 Pressure ulcer of right heel, unstageable: Secondary | ICD-10-CM | POA: Diagnosis not present

## 2018-07-05 DIAGNOSIS — I130 Hypertensive heart and chronic kidney disease with heart failure and stage 1 through stage 4 chronic kidney disease, or unspecified chronic kidney disease: Secondary | ICD-10-CM | POA: Diagnosis not present

## 2018-07-05 DIAGNOSIS — E1122 Type 2 diabetes mellitus with diabetic chronic kidney disease: Secondary | ICD-10-CM | POA: Diagnosis not present

## 2018-07-05 DIAGNOSIS — N183 Chronic kidney disease, stage 3 (moderate): Secondary | ICD-10-CM | POA: Diagnosis not present

## 2018-07-05 DIAGNOSIS — L89620 Pressure ulcer of left heel, unstageable: Secondary | ICD-10-CM | POA: Diagnosis not present

## 2018-07-05 DIAGNOSIS — I5023 Acute on chronic systolic (congestive) heart failure: Secondary | ICD-10-CM | POA: Diagnosis not present

## 2018-07-08 DIAGNOSIS — I5023 Acute on chronic systolic (congestive) heart failure: Secondary | ICD-10-CM | POA: Diagnosis not present

## 2018-07-08 DIAGNOSIS — L89610 Pressure ulcer of right heel, unstageable: Secondary | ICD-10-CM | POA: Diagnosis not present

## 2018-07-08 DIAGNOSIS — I130 Hypertensive heart and chronic kidney disease with heart failure and stage 1 through stage 4 chronic kidney disease, or unspecified chronic kidney disease: Secondary | ICD-10-CM | POA: Diagnosis not present

## 2018-07-08 DIAGNOSIS — N183 Chronic kidney disease, stage 3 (moderate): Secondary | ICD-10-CM | POA: Diagnosis not present

## 2018-07-08 DIAGNOSIS — E1122 Type 2 diabetes mellitus with diabetic chronic kidney disease: Secondary | ICD-10-CM | POA: Diagnosis not present

## 2018-07-08 DIAGNOSIS — L89620 Pressure ulcer of left heel, unstageable: Secondary | ICD-10-CM | POA: Diagnosis not present

## 2018-07-11 DIAGNOSIS — L89620 Pressure ulcer of left heel, unstageable: Secondary | ICD-10-CM | POA: Diagnosis not present

## 2018-07-11 DIAGNOSIS — E1122 Type 2 diabetes mellitus with diabetic chronic kidney disease: Secondary | ICD-10-CM | POA: Diagnosis not present

## 2018-07-11 DIAGNOSIS — N183 Chronic kidney disease, stage 3 (moderate): Secondary | ICD-10-CM | POA: Diagnosis not present

## 2018-07-11 DIAGNOSIS — I130 Hypertensive heart and chronic kidney disease with heart failure and stage 1 through stage 4 chronic kidney disease, or unspecified chronic kidney disease: Secondary | ICD-10-CM | POA: Diagnosis not present

## 2018-07-11 DIAGNOSIS — I5023 Acute on chronic systolic (congestive) heart failure: Secondary | ICD-10-CM | POA: Diagnosis not present

## 2018-07-11 DIAGNOSIS — L89610 Pressure ulcer of right heel, unstageable: Secondary | ICD-10-CM | POA: Diagnosis not present

## 2018-07-12 ENCOUNTER — Other Ambulatory Visit: Payer: Self-pay | Admitting: Pharmacy Technician

## 2018-07-12 NOTE — Patient Outreach (Signed)
[facility]) Care Management  07/12/2018  P[place] Evrard. [birth year] [id number]    Successful call placed to patient regarding patient assistance application(s) for Eliquis , HIPAA identifiers verified. Mr. Petrosino states that he received her SS awards letter in the mail yesterday. He is currently unable to get around so he is not sure when he will be able to make a copy of letter to send with application. Offered patient that if he wanted, he could mail in the original letter with application and I can make a copy of letter and mail him back his original. He stated that he might do that if he someone is not able to make copies of it for him.  Follow up:  Will follow up with patient in 14-21 business days if documents have not been received.  Will submit completed application once all documents have been received.  Maud Deed Chana Bode [place] Certified Pharmacy Technician [place] Management Direct Dial:[phone]

## 2018-07-13 DIAGNOSIS — I130 Hypertensive heart and chronic kidney disease with heart failure and stage 1 through stage 4 chronic kidney disease, or unspecified chronic kidney disease: Secondary | ICD-10-CM | POA: Diagnosis not present

## 2018-07-13 DIAGNOSIS — L89623 Pressure ulcer of left heel, stage 3: Secondary | ICD-10-CM | POA: Diagnosis not present

## 2018-07-13 DIAGNOSIS — E1122 Type 2 diabetes mellitus with diabetic chronic kidney disease: Secondary | ICD-10-CM | POA: Diagnosis not present

## 2018-07-13 DIAGNOSIS — N183 Chronic kidney disease, stage 3 (moderate): Secondary | ICD-10-CM | POA: Diagnosis not present

## 2018-07-13 DIAGNOSIS — L89610 Pressure ulcer of right heel, unstageable: Secondary | ICD-10-CM | POA: Diagnosis not present

## 2018-07-13 DIAGNOSIS — L89620 Pressure ulcer of left heel, unstageable: Secondary | ICD-10-CM | POA: Diagnosis not present

## 2018-07-13 DIAGNOSIS — L89613 Pressure ulcer of right heel, stage 3: Secondary | ICD-10-CM | POA: Diagnosis not present

## 2018-07-13 DIAGNOSIS — E1142 Type 2 diabetes mellitus with diabetic polyneuropathy: Secondary | ICD-10-CM | POA: Diagnosis not present

## 2018-07-13 DIAGNOSIS — I5023 Acute on chronic systolic (congestive) heart failure: Secondary | ICD-10-CM | POA: Diagnosis not present

## 2018-07-14 DIAGNOSIS — L89620 Pressure ulcer of left heel, unstageable: Secondary | ICD-10-CM | POA: Diagnosis not present

## 2018-07-14 DIAGNOSIS — L89610 Pressure ulcer of right heel, unstageable: Secondary | ICD-10-CM | POA: Diagnosis not present

## 2018-07-14 DIAGNOSIS — N183 Chronic kidney disease, stage 3 (moderate): Secondary | ICD-10-CM | POA: Diagnosis not present

## 2018-07-14 DIAGNOSIS — I130 Hypertensive heart and chronic kidney disease with heart failure and stage 1 through stage 4 chronic kidney disease, or unspecified chronic kidney disease: Secondary | ICD-10-CM | POA: Diagnosis not present

## 2018-07-14 DIAGNOSIS — I5023 Acute on chronic systolic (congestive) heart failure: Secondary | ICD-10-CM | POA: Diagnosis not present

## 2018-07-14 DIAGNOSIS — E1122 Type 2 diabetes mellitus with diabetic chronic kidney disease: Secondary | ICD-10-CM | POA: Diagnosis not present

## 2018-07-15 DIAGNOSIS — L89610 Pressure ulcer of right heel, unstageable: Secondary | ICD-10-CM | POA: Diagnosis not present

## 2018-07-15 DIAGNOSIS — I130 Hypertensive heart and chronic kidney disease with heart failure and stage 1 through stage 4 chronic kidney disease, or unspecified chronic kidney disease: Secondary | ICD-10-CM | POA: Diagnosis not present

## 2018-07-15 DIAGNOSIS — I5023 Acute on chronic systolic (congestive) heart failure: Secondary | ICD-10-CM | POA: Diagnosis not present

## 2018-07-15 DIAGNOSIS — E1122 Type 2 diabetes mellitus with diabetic chronic kidney disease: Secondary | ICD-10-CM | POA: Diagnosis not present

## 2018-07-15 DIAGNOSIS — N183 Chronic kidney disease, stage 3 (moderate): Secondary | ICD-10-CM | POA: Diagnosis not present

## 2018-07-15 DIAGNOSIS — L89620 Pressure ulcer of left heel, unstageable: Secondary | ICD-10-CM | POA: Diagnosis not present

## 2018-07-18 DIAGNOSIS — E1122 Type 2 diabetes mellitus with diabetic chronic kidney disease: Secondary | ICD-10-CM | POA: Diagnosis not present

## 2018-07-18 DIAGNOSIS — L89620 Pressure ulcer of left heel, unstageable: Secondary | ICD-10-CM | POA: Diagnosis not present

## 2018-07-18 DIAGNOSIS — N183 Chronic kidney disease, stage 3 (moderate): Secondary | ICD-10-CM | POA: Diagnosis not present

## 2018-07-18 DIAGNOSIS — I5023 Acute on chronic systolic (congestive) heart failure: Secondary | ICD-10-CM | POA: Diagnosis not present

## 2018-07-18 DIAGNOSIS — L89610 Pressure ulcer of right heel, unstageable: Secondary | ICD-10-CM | POA: Diagnosis not present

## 2018-07-18 DIAGNOSIS — I130 Hypertensive heart and chronic kidney disease with heart failure and stage 1 through stage 4 chronic kidney disease, or unspecified chronic kidney disease: Secondary | ICD-10-CM | POA: Diagnosis not present

## 2018-07-19 DIAGNOSIS — M109 Gout, unspecified: Secondary | ICD-10-CM | POA: Diagnosis not present

## 2018-07-19 DIAGNOSIS — E1165 Type 2 diabetes mellitus with hyperglycemia: Secondary | ICD-10-CM | POA: Diagnosis not present

## 2018-07-20 ENCOUNTER — Ambulatory Visit: Payer: Medicare Other | Admitting: Family

## 2018-07-20 DIAGNOSIS — L89613 Pressure ulcer of right heel, stage 3: Secondary | ICD-10-CM | POA: Diagnosis not present

## 2018-07-20 DIAGNOSIS — E1122 Type 2 diabetes mellitus with diabetic chronic kidney disease: Secondary | ICD-10-CM | POA: Diagnosis not present

## 2018-07-20 DIAGNOSIS — L89623 Pressure ulcer of left heel, stage 3: Secondary | ICD-10-CM | POA: Diagnosis not present

## 2018-07-20 DIAGNOSIS — I130 Hypertensive heart and chronic kidney disease with heart failure and stage 1 through stage 4 chronic kidney disease, or unspecified chronic kidney disease: Secondary | ICD-10-CM | POA: Diagnosis not present

## 2018-07-20 DIAGNOSIS — E1142 Type 2 diabetes mellitus with diabetic polyneuropathy: Secondary | ICD-10-CM | POA: Diagnosis not present

## 2018-07-20 DIAGNOSIS — L89610 Pressure ulcer of right heel, unstageable: Secondary | ICD-10-CM | POA: Diagnosis not present

## 2018-07-20 DIAGNOSIS — L89620 Pressure ulcer of left heel, unstageable: Secondary | ICD-10-CM | POA: Diagnosis not present

## 2018-07-20 DIAGNOSIS — N183 Chronic kidney disease, stage 3 (moderate): Secondary | ICD-10-CM | POA: Diagnosis not present

## 2018-07-20 DIAGNOSIS — I5023 Acute on chronic systolic (congestive) heart failure: Secondary | ICD-10-CM | POA: Diagnosis not present

## 2018-07-22 DIAGNOSIS — I5023 Acute on chronic systolic (congestive) heart failure: Secondary | ICD-10-CM | POA: Diagnosis not present

## 2018-07-22 DIAGNOSIS — E1122 Type 2 diabetes mellitus with diabetic chronic kidney disease: Secondary | ICD-10-CM | POA: Diagnosis not present

## 2018-07-22 DIAGNOSIS — I130 Hypertensive heart and chronic kidney disease with heart failure and stage 1 through stage 4 chronic kidney disease, or unspecified chronic kidney disease: Secondary | ICD-10-CM | POA: Diagnosis not present

## 2018-07-22 DIAGNOSIS — N183 Chronic kidney disease, stage 3 (moderate): Secondary | ICD-10-CM | POA: Diagnosis not present

## 2018-07-22 DIAGNOSIS — L89620 Pressure ulcer of left heel, unstageable: Secondary | ICD-10-CM | POA: Diagnosis not present

## 2018-07-22 DIAGNOSIS — L89610 Pressure ulcer of right heel, unstageable: Secondary | ICD-10-CM | POA: Diagnosis not present

## 2018-07-25 ENCOUNTER — Other Ambulatory Visit: Payer: Self-pay

## 2018-07-25 ENCOUNTER — Inpatient Hospital Stay: Payer: Medicare Other

## 2018-07-25 ENCOUNTER — Inpatient Hospital Stay
Admission: AD | Admit: 2018-07-25 | Discharge: 2018-08-02 | DRG: 623 | Disposition: A | Discharge status: Home Health | Payer: Medicare Other | Attending: Internal Medicine | Admitting: Internal Medicine

## 2018-07-25 DIAGNOSIS — I130 Hypertensive heart and chronic kidney disease with heart failure and stage 1 through stage 4 chronic kidney disease, or unspecified chronic kidney disease: Secondary | ICD-10-CM | POA: Diagnosis present

## 2018-07-25 DIAGNOSIS — B964 Proteus (mirabilis) (morganii) as the cause of diseases classified elsewhere: Secondary | ICD-10-CM | POA: Diagnosis not present

## 2018-07-25 DIAGNOSIS — M109 Gout, unspecified: Secondary | ICD-10-CM | POA: Diagnosis present

## 2018-07-25 DIAGNOSIS — Z7901 Long term (current) use of anticoagulants: Secondary | ICD-10-CM | POA: Diagnosis not present

## 2018-07-25 DIAGNOSIS — L97516 Non-pressure chronic ulcer of other part of right foot with bone involvement without evidence of necrosis: Secondary | ICD-10-CM | POA: Diagnosis not present

## 2018-07-25 DIAGNOSIS — D631 Anemia in chronic kidney disease: Secondary | ICD-10-CM | POA: Diagnosis present

## 2018-07-25 DIAGNOSIS — Z95810 Presence of automatic (implantable) cardiac defibrillator: Secondary | ICD-10-CM

## 2018-07-25 DIAGNOSIS — E11628 Type 2 diabetes mellitus with other skin complications: Secondary | ICD-10-CM | POA: Diagnosis not present

## 2018-07-25 DIAGNOSIS — E1159 Type 2 diabetes mellitus with other circulatory complications: Secondary | ICD-10-CM | POA: Diagnosis not present

## 2018-07-25 DIAGNOSIS — L97422 Non-pressure chronic ulcer of left heel and midfoot with fat layer exposed: Secondary | ICD-10-CM | POA: Diagnosis not present

## 2018-07-25 DIAGNOSIS — N184 Chronic kidney disease, stage 4 (severe): Secondary | ICD-10-CM

## 2018-07-25 DIAGNOSIS — L97419 Non-pressure chronic ulcer of right heel and midfoot with unspecified severity: Secondary | ICD-10-CM | POA: Diagnosis not present

## 2018-07-25 DIAGNOSIS — E1122 Type 2 diabetes mellitus with diabetic chronic kidney disease: Secondary | ICD-10-CM | POA: Diagnosis present

## 2018-07-25 DIAGNOSIS — Z9119 Patient's noncompliance with other medical treatment and regimen: Secondary | ICD-10-CM

## 2018-07-25 DIAGNOSIS — Z79891 Long term (current) use of opiate analgesic: Secondary | ICD-10-CM

## 2018-07-25 DIAGNOSIS — B9562 Methicillin resistant Staphylococcus aureus infection as the cause of diseases classified elsewhere: Secondary | ICD-10-CM | POA: Diagnosis present

## 2018-07-25 DIAGNOSIS — I5042 Chronic combined systolic (congestive) and diastolic (congestive) heart failure: Secondary | ICD-10-CM | POA: Diagnosis present

## 2018-07-25 DIAGNOSIS — Z7952 Long term (current) use of systemic steroids: Secondary | ICD-10-CM | POA: Diagnosis not present

## 2018-07-25 DIAGNOSIS — L97319 Non-pressure chronic ulcer of right ankle with unspecified severity: Secondary | ICD-10-CM | POA: Diagnosis not present

## 2018-07-25 DIAGNOSIS — Z79899 Other long term (current) drug therapy: Secondary | ICD-10-CM

## 2018-07-25 DIAGNOSIS — M5412 Radiculopathy, cervical region: Secondary | ICD-10-CM | POA: Diagnosis present

## 2018-07-25 DIAGNOSIS — I2510 Atherosclerotic heart disease of native coronary artery without angina pectoris: Secondary | ICD-10-CM | POA: Diagnosis present

## 2018-07-25 DIAGNOSIS — I10 Essential (primary) hypertension: Secondary | ICD-10-CM | POA: Diagnosis not present

## 2018-07-25 DIAGNOSIS — M5137 Other intervertebral disc degeneration, lumbosacral region: Secondary | ICD-10-CM | POA: Diagnosis present

## 2018-07-25 DIAGNOSIS — Z4800 Encounter for change or removal of nonsurgical wound dressing: Secondary | ICD-10-CM | POA: Diagnosis not present

## 2018-07-25 DIAGNOSIS — L97414 Non-pressure chronic ulcer of right heel and midfoot with necrosis of bone: Secondary | ICD-10-CM | POA: Diagnosis not present

## 2018-07-25 DIAGNOSIS — I129 Hypertensive chronic kidney disease with stage 1 through stage 4 chronic kidney disease, or unspecified chronic kidney disease: Secondary | ICD-10-CM | POA: Diagnosis not present

## 2018-07-25 DIAGNOSIS — E1140 Type 2 diabetes mellitus with diabetic neuropathy, unspecified: Secondary | ICD-10-CM | POA: Diagnosis present

## 2018-07-25 DIAGNOSIS — L89623 Pressure ulcer of left heel, stage 3: Secondary | ICD-10-CM | POA: Diagnosis not present

## 2018-07-25 DIAGNOSIS — E1142 Type 2 diabetes mellitus with diabetic polyneuropathy: Secondary | ICD-10-CM | POA: Diagnosis not present

## 2018-07-25 DIAGNOSIS — Z8673 Personal history of transient ischemic attack (TIA), and cerebral infarction without residual deficits: Secondary | ICD-10-CM

## 2018-07-25 DIAGNOSIS — M86171 Other acute osteomyelitis, right ankle and foot: Secondary | ICD-10-CM | POA: Diagnosis present

## 2018-07-25 DIAGNOSIS — Z794 Long term (current) use of insulin: Secondary | ICD-10-CM

## 2018-07-25 DIAGNOSIS — I6523 Occlusion and stenosis of bilateral carotid arteries: Secondary | ICD-10-CM | POA: Diagnosis present

## 2018-07-25 DIAGNOSIS — I252 Old myocardial infarction: Secondary | ICD-10-CM | POA: Diagnosis not present

## 2018-07-25 DIAGNOSIS — N189 Chronic kidney disease, unspecified: Secondary | ICD-10-CM | POA: Diagnosis not present

## 2018-07-25 DIAGNOSIS — I5022 Chronic systolic (congestive) heart failure: Secondary | ICD-10-CM | POA: Diagnosis not present

## 2018-07-25 DIAGNOSIS — E1151 Type 2 diabetes mellitus with diabetic peripheral angiopathy without gangrene: Secondary | ICD-10-CM | POA: Diagnosis present

## 2018-07-25 DIAGNOSIS — L97529 Non-pressure chronic ulcer of other part of left foot with unspecified severity: Secondary | ICD-10-CM | POA: Diagnosis not present

## 2018-07-25 DIAGNOSIS — R600 Localized edema: Secondary | ICD-10-CM | POA: Diagnosis not present

## 2018-07-25 DIAGNOSIS — Z886 Allergy status to analgesic agent status: Secondary | ICD-10-CM

## 2018-07-25 DIAGNOSIS — L089 Local infection of the skin and subcutaneous tissue, unspecified: Secondary | ICD-10-CM | POA: Diagnosis not present

## 2018-07-25 DIAGNOSIS — I7092 Chronic total occlusion of artery of the extremities: Secondary | ICD-10-CM | POA: Diagnosis not present

## 2018-07-25 DIAGNOSIS — G4733 Obstructive sleep apnea (adult) (pediatric): Secondary | ICD-10-CM | POA: Diagnosis present

## 2018-07-25 DIAGNOSIS — L97416 Non-pressure chronic ulcer of right heel and midfoot with bone involvement without evidence of necrosis: Secondary | ICD-10-CM | POA: Diagnosis present

## 2018-07-25 DIAGNOSIS — I739 Peripheral vascular disease, unspecified: Secondary | ICD-10-CM | POA: Diagnosis not present

## 2018-07-25 DIAGNOSIS — Z888 Allergy status to other drugs, medicaments and biological substances status: Secondary | ICD-10-CM

## 2018-07-25 DIAGNOSIS — L89613 Pressure ulcer of right heel, stage 3: Secondary | ICD-10-CM | POA: Diagnosis not present

## 2018-07-25 DIAGNOSIS — Z89421 Acquired absence of other right toe(s): Secondary | ICD-10-CM

## 2018-07-25 DIAGNOSIS — M869 Osteomyelitis, unspecified: Secondary | ICD-10-CM | POA: Diagnosis not present

## 2018-07-25 DIAGNOSIS — N179 Acute kidney failure, unspecified: Secondary | ICD-10-CM | POA: Diagnosis not present

## 2018-07-25 DIAGNOSIS — I255 Ischemic cardiomyopathy: Secondary | ICD-10-CM | POA: Diagnosis present

## 2018-07-25 DIAGNOSIS — Z8249 Family history of ischemic heart disease and other diseases of the circulatory system: Secondary | ICD-10-CM

## 2018-07-25 DIAGNOSIS — L97519 Non-pressure chronic ulcer of other part of right foot with unspecified severity: Secondary | ICD-10-CM | POA: Diagnosis not present

## 2018-07-25 DIAGNOSIS — E11621 Type 2 diabetes mellitus with foot ulcer: Principal | ICD-10-CM | POA: Diagnosis present

## 2018-07-25 DIAGNOSIS — I70235 Atherosclerosis of native arteries of right leg with ulceration of other part of foot: Secondary | ICD-10-CM | POA: Diagnosis not present

## 2018-07-25 DIAGNOSIS — E119 Type 2 diabetes mellitus without complications: Secondary | ICD-10-CM | POA: Diagnosis not present

## 2018-07-25 DIAGNOSIS — Z951 Presence of aortocoronary bypass graft: Secondary | ICD-10-CM | POA: Diagnosis not present

## 2018-07-25 DIAGNOSIS — L97429 Non-pressure chronic ulcer of left heel and midfoot with unspecified severity: Secondary | ICD-10-CM | POA: Diagnosis not present

## 2018-07-25 DIAGNOSIS — L97329 Non-pressure chronic ulcer of left ankle with unspecified severity: Secondary | ICD-10-CM | POA: Diagnosis not present

## 2018-07-25 DIAGNOSIS — N183 Chronic kidney disease, stage 3 (moderate): Secondary | ICD-10-CM | POA: Diagnosis not present

## 2018-07-25 DIAGNOSIS — Z833 Family history of diabetes mellitus: Secondary | ICD-10-CM

## 2018-07-25 DIAGNOSIS — Z7982 Long term (current) use of aspirin: Secondary | ICD-10-CM

## 2018-07-25 LAB — CBC
HCT: 33.7 % — ABNORMAL LOW (ref 39.0–52.0)
Hemoglobin: 10.2 g/dL — ABNORMAL LOW (ref 13.0–17.0)
MCH: 24.9 pg — ABNORMAL LOW (ref 26.0–34.0)
MCHC: 30.3 g/dL (ref 30.0–36.0)
MCV: 82.4 fL (ref 80.0–100.0)
Platelets: 429 10*3/uL — ABNORMAL HIGH (ref 150–400)
RBC: 4.09 MIL/uL — ABNORMAL LOW (ref 4.22–5.81)
RDW: 18.4 % — ABNORMAL HIGH (ref 11.5–15.5)
WBC: 21.4 10*3/uL — ABNORMAL HIGH (ref 4.0–10.5)
nRBC: 0 % (ref 0.0–0.2)

## 2018-07-25 LAB — BASIC METABOLIC PANEL
Anion gap: 11 (ref 5–15)
BUN: 62 mg/dL — AB (ref 6–20)
CO2: 27 mmol/L (ref 22–32)
Calcium: 8.8 mg/dL — ABNORMAL LOW (ref 8.9–10.3)
Chloride: 97 mmol/L — ABNORMAL LOW (ref 98–111)
Creatinine, Ser: 2.7 mg/dL — ABNORMAL HIGH (ref 0.61–1.24)
GFR calc Af Amer: 30 mL/min — ABNORMAL LOW (ref >60)
GFR calc non Af Amer: 26 mL/min — ABNORMAL LOW (ref >60)
Glucose, Bld: 255 mg/dL — ABNORMAL HIGH (ref 70–99)
Potassium: 4.3 mmol/L (ref 3.5–5.1)
Sodium: 135 mmol/L (ref 135–145)

## 2018-07-25 LAB — GLUCOSE, CAPILLARY
Glucose-Capillary: 231 mg/dL — ABNORMAL HIGH (ref 70–99)
Glucose-Capillary: 303 mg/dL — ABNORMAL HIGH (ref 70–99)

## 2018-07-25 MED ORDER — ACETAMINOPHEN 650 MG RE SUPP: 650.0000 mg | Freq: Four times a day (QID) | RECTAL | Status: DC | PRN

## 2018-07-25 MED ORDER — ALLOPURINOL 100 MG PO TABS
50.0000 mg | ORAL_TABLET | Freq: Every day | ORAL | Status: DC
  Administered 2018-07-26 – 2018-07-27 (×2): 50 mg via ORAL
  Administered 2018-07-28: 11:00:00 via ORAL
  Administered 2018-07-29 – 2018-07-30 (×2): 50 mg via ORAL
  Filled 2018-07-25 (×6): qty 0.5

## 2018-07-25 MED ORDER — INSULIN ASPART 100 UNIT/ML ~~LOC~~ SOLN
0.0000 [IU] | Freq: Three times a day (TID) | SUBCUTANEOUS | Status: DC
  Administered 2018-07-26 (×3): 1 [IU] via SUBCUTANEOUS
  Administered 2018-07-27 – 2018-07-28 (×2): 2 [IU] via SUBCUTANEOUS
  Administered 2018-07-29: 7 [IU] via SUBCUTANEOUS
  Administered 2018-07-29: 9 [IU] via SUBCUTANEOUS
  Filled 2018-07-25 (×7): qty 1

## 2018-07-25 MED ORDER — PIPERACILLIN-TAZOBACTAM 3.375 G IVPB
3.3750 g | Freq: Three times a day (TID) | INTRAVENOUS | Status: DC
  Administered 2018-07-26 (×3): 3.375 g via INTRAVENOUS
  Filled 2018-07-25 (×3): qty 50

## 2018-07-25 MED ORDER — VANCOMYCIN HCL 10 G IV SOLR
2250.0000 mg | INTRAVENOUS | Status: DC
  Filled 2018-07-25: qty 2250

## 2018-07-25 MED ORDER — GABAPENTIN 400 MG PO CAPS
400.0000 mg | ORAL_CAPSULE | Freq: Three times a day (TID) | ORAL | Status: DC
  Administered 2018-07-25 – 2018-08-02 (×22): 400 mg via ORAL
  Filled 2018-07-25 (×22): qty 1

## 2018-07-25 MED ORDER — PIPERACILLIN-TAZOBACTAM 3.375 G IVPB 30 MIN
3.3750 g | Freq: Once | INTRAVENOUS | Status: AC
  Administered 2018-07-25: 3.375 g via INTRAVENOUS
  Filled 2018-07-25: qty 50

## 2018-07-25 MED ORDER — SODIUM CHLORIDE 0.9 % IV SOLN
INTRAVENOUS | Status: AC
  Administered 2018-07-25 – 2018-07-27 (×2): via INTRAVENOUS

## 2018-07-25 MED ORDER — CARVEDILOL 3.125 MG PO TABS
3.1250 mg | ORAL_TABLET | Freq: Two times a day (BID) | ORAL | Status: DC
  Administered 2018-07-25 – 2018-08-02 (×15): 3.125 mg via ORAL
  Filled 2018-07-25 (×15): qty 1

## 2018-07-25 MED ORDER — TORSEMIDE 20 MG PO TABS
40.0000 mg | ORAL_TABLET | Freq: Two times a day (BID) | ORAL | Status: DC
  Administered 2018-07-25 – 2018-07-30 (×7): 40 mg via ORAL
  Filled 2018-07-25 (×8): qty 2

## 2018-07-25 MED ORDER — OXYCODONE HCL 5 MG PO TABS
10.0000 mg | ORAL_TABLET | Freq: Four times a day (QID) | ORAL | Status: DC | PRN
  Administered 2018-07-25 – 2018-08-02 (×24): 10 mg via ORAL
  Filled 2018-07-25 (×24): qty 2

## 2018-07-25 MED ORDER — VANCOMYCIN HCL 1000 MG IV SOLR
1000.0000 mg | Freq: Once | INTRAVENOUS | Status: DC
  Filled 2018-07-25: qty 1000

## 2018-07-25 MED ORDER — INSULIN GLARGINE 100 UNIT/ML ~~LOC~~ SOLN
30.0000 [IU] | Freq: Every day | SUBCUTANEOUS | Status: DC
  Administered 2018-07-25 – 2018-07-31 (×7): 30 [IU] via SUBCUTANEOUS
  Filled 2018-07-25 (×8): qty 0.3

## 2018-07-25 MED ORDER — ACETAMINOPHEN 325 MG PO TABS
650.0000 mg | ORAL_TABLET | Freq: Four times a day (QID) | ORAL | Status: DC | PRN
  Administered 2018-07-26 – 2018-08-02 (×18): 650 mg via ORAL
  Filled 2018-07-25 (×17): qty 2

## 2018-07-25 MED ORDER — PREDNISONE 10 MG PO TABS
5.0000 mg | ORAL_TABLET | Freq: Every day | ORAL | Status: DC
  Administered 2018-07-26 – 2018-08-02 (×8): 5 mg via ORAL
  Filled 2018-07-25 (×8): qty 1

## 2018-07-25 MED ORDER — VANCOMYCIN HCL 10 G IV SOLR
2000.0000 mg | Freq: Once | INTRAVENOUS | Status: AC
  Administered 2018-07-25: 2000 mg via INTRAVENOUS
  Filled 2018-07-25: qty 2000

## 2018-07-25 MED ORDER — ASPIRIN EC 81 MG PO TBEC
81.0000 mg | DELAYED_RELEASE_TABLET | Freq: Every day | ORAL | Status: DC
  Administered 2018-07-25 – 2018-08-02 (×8): 81 mg via ORAL
  Filled 2018-07-25 (×8): qty 1

## 2018-07-25 NOTE — H&P (Addendum)
Paul [name] at Paul [name] NAME: Paul [name]    MR#:  [id number]  DATE OF BIRTH:  March 31, 1966  DATE OF ADMISSION:  07/25/2018  PRIMARY CARE PHYSICIAN: Paul Carls, MD   REQUESTING/REFERRING PHYSICIAN: Awanda [name]  CHIEF COMPLAINT:  No chief complaint on file.   HISTORY OF PRESENT ILLNESS: Paul [name]  is a 53 y.o. male with a known history of coronary artery disease, CHF, chronic kidney disease stage III, diabetes, essential hypertension, previous history of stroke and MI who is sent from Paul [name] office for concern for right heel osteomyelitis.  Patient had a wound VAC on both of his heels.  He was taken off today by Dr. Elvina [name] noted to have concerning for worsening right heel wound.  He wanted patient to be admitted for antibiotics vascular surgery evaluation and ID input.  Patient otherwise denies any chest pain or shortness of breath no fevers or chills.  No cough.  To note patient has been on Eliquis which was stopped a week ago according to patient due to bleeding from his wound in the lower extremity.         PAST MEDICAL HISTORY:   Past Medical History:  Diagnosis Date  . Anemia   . Cardiac defibrillator in place    a. Biotronik LUmax 540 DRT, (ser # [id number]).  . Carotid arterial disease ([place])    a. s/p prior LICA stenting;  b. 07/2438 Carotid U/S: 40-59% bilat ICA stenosis. Patent LICA stent.  . CHF (congestive heart failure) ([place])   . CKD (chronic kidney disease), stage III ([place])   . Coronary artery disease    a. 2010 s/p CABG x 3.  . DDD (degenerative disc disease), lumbosacral    L5-S1  . Diabetes ([place])    Lantus at bedtime  . Gangrene of toe of right foot ([place])   . Gout of left hand 10/06/2016  . HFrEF (heart failure with reduced ejection fraction) ([place])    a. 07/2016 Echo: EF 25-30%, diff HK, mild MR, mildly dil LA, mod reduced RV fxn, PASP 62mmHg.  [place] Hypertension    takes Coreg daily  . Ischemic  cardiomyopathy    a. 07/2016 Echo: EF 25-30%, diff HK.  . Myocardial infarction ([place]) 2009  . Peripheral vascular disease ([place])   . Sleep apnea    sleep study yr ago-unable to afford cpap  . Stroke [facility]) 09   no weakness    PAST SURGICAL HISTORY:  Past Surgical History:  Procedure Laterality Date  . AMPUTATION TOE Right 08/22/2016   Procedure: AMPUTATION TOE;  Surgeon: Samara Deist, DPM;  Location: [facility] ORS;  Service: Podiatry;  Laterality: Right;  . AMPUTATION TOE Right 10/09/2016   Procedure: AMPUTATION TOE-RIGHT 2ND MPJ;  Surgeon: Samara Deist, DPM;  Location: [facility] ORS;  Service: Podiatry;  Laterality: Right;  . APLIGRAFT PLACEMENT Right 10/09/2016   Procedure: APLIGRAFT PLACEMENT;  Surgeon: Samara Deist, DPM;  Location: [facility] ORS;  Service: Podiatry;  Laterality: Right;  . CARDIAC DEFIBRILLATOR PLACEMENT  2011  . CAROTID ENDARTERECTOMY Left   . CHOLECYSTECTOMY  2010  . CORONARY ARTERY BYPASS GRAFT  2010   CABG x 3   . GRAFT APPLICATION Right 05/06/7251   Procedure: FULL THICKNESS SKIN GRAFT-RIGHT FOOT;  Surgeon: Algernon Huxley, MD;  Location: [facility] ORS;  Service: Vascular;  Laterality: Right;  . HIP SURGERY Right 1994  . INCISION AND DRAINAGE OF WOUND Right 08/22/2016   Procedure: IRRIGATION AND DEBRIDEMENT WOUND and  wound vac placement;  Surgeon: Samara Deist, DPM;  Location: [facility] ORS;  Service: Podiatry;  Laterality: Right;  . LOWER EXTREMITY ANGIOGRAPHY Right 08/24/2016   Procedure: Lower Extremity Angiography;  Surgeon: Algernon Huxley, MD;  Location: [place] CV LAB;  Service: Cardiovascular;  Laterality: Right;  . MASS EXCISION Right 07/18/2014   Procedure: EXCISION HETEROTOPIC BONE RIGHT HIP;  Surgeon: Frederik Pear, MD;  Location: [place];  Service: Orthopedics;  Laterality: Right;  . Open Heart Surgery  2010   x 3  . WOUND DEBRIDEMENT Right 10/09/2016   Procedure: DEBRIDEMENT WOUND;  Surgeon: Samara Deist, DPM;  Location: [facility] ORS;  Service: Podiatry;  Laterality: Right;     SOCIAL HISTORY:  Social History   Tobacco Use  . Smoking status: Never Smoker  . Smokeless tobacco: Never Used  Substance Use Topics  . Alcohol use: No    FAMILY HISTORY:  Family History  Problem Relation Age of Onset  . Hypertension Other   . Diabetes Other   . Diabetes Father   . Hyperlipidemia Father   . Hypertension Father   . Hyperlipidemia Sister   . Hypertension Sister   . Diabetes Sister   . Diabetes Brother   . Hypertension Brother   . Hyperlipidemia Brother     DRUG ALLERGIES:  Allergies  Allergen Reactions  . Ibuprofen Other (See Comments)    Heart problems  . Baclofen Other (See Comments)  . Metformin Diarrhea  . Nsaids     Due to kidney and heart problems per pt    REVIEW OF SYSTEMS:   CONSTITUTIONAL: No fever, fatigue or weakness.  EYES: No blurred or double vision.  EARS, NOSE, AND THROAT: No tinnitus or ear pain.  RESPIRATORY: No cough, shortness of breath, wheezing or hemoptysis.  CARDIOVASCULAR: No chest pain, orthopnea, edema.  GASTROINTESTINAL: No nausea, vomiting, diarrhea or abdominal pain.  GENITOURINARY: No dysuria, hematuria.  ENDOCRINE: No polyuria, nocturia,  HEMATOLOGY: No anemia, easy bruising or bleeding SKIN: No rash or lesion. MUSCULOSKELETAL: Right heel pain NEUROLOGIC: No tingling, numbness, weakness.  PSYCHIATRY: No anxiety or depression.   MEDICATIONS AT HOME:  Prior to Admission medications   Medication Sig Start Date End Date Taking? Authorizing Provider  acetaminophen (TYLENOL) 325 MG tablet Take 2 tablets (650 mg total) by mouth every 6 (six) hours as needed for mild pain (or Fever >/= 101). 05/29/18   Loletha Grayer, MD  allopurinol (ZYLOPRIM) 100 MG tablet Take 0.5 tablets (50 mg total) by mouth daily. 05/30/18   Loletha Grayer, MD  apixaban (ELIQUIS) 5 MG TABS tablet Take 1 tablet (5 mg total) by mouth 2 (two) times daily. Patient not taking: Reported on 07/25/2018 05/29/18   Loletha Grayer, MD  aspirin EC  81 MG tablet Take 1 tablet (81 mg total) by mouth daily. 10/21/16   Theora Gianotti, NP  carvedilol (COREG) 3.125 MG tablet Take 1 tablet (3.125 mg total) by mouth 2 (two) times daily. 05/29/18   Loletha Grayer, MD  gabapentin (NEURONTIN) 400 MG capsule Take 400 mg by mouth 3 (three) times daily.  03/03/18   [provider]  insulin glargine (LANTUS) 100 unit/mL SOPN Inject 0.2 mLs (20 Units total) into the skin at bedtime. Patient taking differently: Inject 30 Units into the skin at bedtime.  05/29/18   Loletha Grayer, MD  oxyCODONE 10 MG TABS Take 1 tablet (10 mg total) by mouth every 6 (six) hours as needed for moderate pain. 05/29/18   Loletha Grayer, MD  potassium  chloride SA (K-DUR,KLOR-CON) 20 MEQ tablet Take 1 tablet (20 mEq total) by mouth daily. Patient not taking: Reported on 07/25/2018 05/30/18   Loletha Grayer, MD  predniSONE (DELTASONE) 5 MG tablet 3 tabs po daily for three days then 2 tabs po daily for three days then one tab po daily afterwards 05/29/18   Loletha Grayer, MD  torsemide (DEMADEX) 20 MG tablet Take 2 tablets (40 mg total) by mouth 2 (two) times daily. 05/29/18   Loletha Grayer, MD      PHYSICAL EXAMINATION:   VITAL SIGNS: Blood pressure (!) 157/96, pulse 84, temperature 98 F (36.7 C), temperature source Oral, resp. rate 18, SpO2 100 %.  GENERAL:  53 y.o.-year-old patient lying in the bed with no acute distress.  EYES: Pupils equal, round, reactive to light and accommodation. No scleral icterus. Extraocular muscles intact.  HEENT: Head atraumatic, normocephalic. Oropharynx and nasopharynx clear.  NECK:  Supple, no jugular venous distention. No thyroid enlargement, no tenderness.  LUNGS: Normal breath sounds bilaterally, no wheezing, rales,rhonchi or crepitation. No use of accessory muscles of respiration.  CARDIOVASCULAR: S1, S2 normal. No murmurs, rubs, or gallops.  ABDOMEN: Soft, nontender, nondistended. Bowel sounds present. No  organomegaly or mass.  EXTREMITIES: Bilateral lower extremities with dressing in place NEUROLOGIC: Cranial nerves II through XII are intact. Muscle strength 5/5 in all extremities. Sensation intact. Gait not checked.  PSYCHIATRIC: The patient is alert and oriented x 3.  SKIN: No obvious rash, lesion, or ulcer.   LABORATORY PANEL:   CBC No results for input(s): WBC, HGB, HCT, PLT, MCV, MCH, MCHC, RDW, LYMPHSABS, MONOABS, EOSABS, BASOSABS, BANDABS in the last 168 hours.  Invalid input(s): NEUTRABS, BANDSABD ------------------------------------------------------------------------------------------------------------------  Chemistries  No results for input(s): NA, K, CL, CO2, GLUCOSE, BUN, CREATININE, CALCIUM, MG, AST, ALT, ALKPHOS, BILITOT in the last 168 hours.  Invalid input(s): GFRCGP ------------------------------------------------------------------------------------------------------------------ CrCl cannot be calculated (Patient's most recent lab result is older than the maximum 21 days allowed.). ------------------------------------------------------------------------------------------------------------------ No results for input(s): TSH, T4TOTAL, T3FREE, THYROIDAB in the last 72 hours.  Invalid input(s): FREET3   Coagulation profile No results for input(s): INR, PROTIME in the last 168 hours. ------------------------------------------------------------------------------------------------------------------- No results for input(s): DDIMER in the last 72 hours. -------------------------------------------------------------------------------------------------------------------  Cardiac Enzymes No results for input(s): CKMB, TROPONINI, MYOGLOBIN in the last 168 hours.  Invalid input(s): CK ------------------------------------------------------------------------------------------------------------------ Invalid input(s):  POCBNP  ---------------------------------------------------------------------------------------------------------------  Urinalysis    Component Value Date/Time   COLORURINE YELLOW (A) 05/25/2018 0049   APPEARANCEUR CLEAR (A) 05/25/2018 0049   LABSPEC 1.013 05/25/2018 0049   PHURINE 5.0 05/25/2018 0049   GLUCOSEU 50 (A) 05/25/2018 0049   HGBUR NEGATIVE 05/25/2018 0049   BILIRUBINUR NEGATIVE 05/25/2018 0049   KETONESUR NEGATIVE 05/25/2018 0049   PROTEINUR NEGATIVE 05/25/2018 0049   UROBILINOGEN 1.0 07/16/2014 1558   NITRITE NEGATIVE 05/25/2018 0049   LEUKOCYTESUR NEGATIVE 05/25/2018 0049     RADIOLOGY: Dg Foot Complete Right  Result Date: 07/25/2018 CLINICAL DATA:  Decubitus ulcers on the bilateral heels. Worsening on the right with improvement on the left. EXAM: RIGHT FOOT COMPLETE - 3+ VIEW COMPARISON:  None. FINDINGS: The patient is status post amputation of the distal foot. Vascular calcifications are noted. There is an ulcer posterior to the calcaneus seen on the lateral view. The adjacent calcaneus demonstrates no erosion. IMPRESSION: 1. No convincing evidence of osteomyelitis in the posterior calcaneus adjacent to the patient's ulcer. An MRI would be more sensitive if concern persists. Electronically Signed   By: Dorise Bullion III M.D  On: 07/25/2018 16:45    EKG: Orders placed or performed during the hospital encounter of 05/24/18  . ED EKG  . ED EKG  . EKG 12-Lead  . EKG 12-Lead    IMPRESSION AND PLAN: Patient is 53 year old with multiple medical problems as stated above presenting with right heel osteomyelitis  1.  Right heel osteomyelitis CT scan of the leg currently pending continue broad-spectrum IV antibiotic Infectious disease consult has been obtained Also vascular consult has been obtained Cultures from office shows Proteus and MRSA we will treat with IV antibiotics Note Dr. Vickki Muff has consulted vascular surgery and infectious disease  2.  Diabetes type  2 Place on sliding scale insulin treat with Lantus as taking at home  3.  History of gout states that he takes c continue allopurinol  4.  Cervical radiculopathy patient states that he is on chronic prednisone at 5 mg I will continue that  5.  Neuropathy continue gabapentin  6.  Chronic congestive heart failure type unknown continue Demadex as taking at home  7.  Miscellaneous Lovenox for DVT prophylaxis        All the records are reviewed and case discussed with ED provider. Management plans discussed with the patient, family and they are in agreement.  CODE STATUS:    Code Status Orders  (From admission, onward)         Start     Ordered   07/25/18 1718  Full code  Continuous     07/25/18 1717        Code Status History    Date Active Date Inactive Code Status Order ID Comments User Context   05/25/2018 2010 05/29/2018 2022 Full Code [id number]  Hillary Bow, MD ED   10/09/2016 1003 10/09/2016 1340 Full Code [id number]  Samara Deist, [facility] Inpatient   08/22/2016 0042 08/27/2016 1649 Full Code [id number]  Lance Coon, MD Inpatient   07/18/2014 1818 07/20/2014 2002 Full Code [id number]  Leighton Parody, PA-C Inpatient       TOTAL TIME TAKING CARE OF THIS PATIENT: 55 minutes.    Dustin Flock M.D on 07/25/2018 at 5:18 PM  Between 7am to 6pm - Pager - [phone]  After 6pm go to [email or web address] - password [email or web address] [facility]  [phone]  CC: Primary care physician; Paul Carls, MD

## 2018-07-25 NOTE — Consult Note (Signed)
Pharmacy Antibiotic Note  Paul [name]. is a 53 y.o. male admitted on 07/25/2018 with cellulitis.  Pharmacy has been consulted for Vancomycin/Zosyn dosing.  Plan: Zosyn 3.375g IV q8h (4 hour infusion).  Vancomycin Loading dose of 2000mg , followed by 2250mg  q 48hr (weight updated after initial load given)  Goal AUC 400-550. Expected AUC: 523 SCr used: 2.7   Temp (24hrs), Avg:98 F (36.7 C), Min:98 F (36.7 C), Max:98 F (36.7 C)  No results for input(s): WBC, CREATININE, LATICACIDVEN, VANCOTROUGH, VANCOPEAK, VANCORANDOM, GENTTROUGH, GENTPEAK, GENTRANDOM, TOBRATROUGH, TOBRAPEAK, TOBRARND, AMIKACINPEAK, AMIKACINTROU, AMIKACIN in the last 168 hours.  CrCl cannot be calculated (Patient's most recent lab result is older than the maximum 21 days allowed.).    Allergies  Allergen Reactions  . Ibuprofen Other (See Comments)    Heart problems  . Baclofen Other (See Comments)  . Metformin Diarrhea  . Nsaids     Due to kidney and heart problems per pt    Antimicrobials this admission: Zosyn 3/23 >>  Vancomycin 3/23 >>   Dose adjustments this admission: None  Microbiology results: 3/23 BCx: pending   Thank you for allowing pharmacy to be a part of this patient's care.  Lu Duffel, PharmD, BCPS Clinical Pharmacist 07/25/2018 5:39 PM

## 2018-07-25 NOTE — Consult Note (Signed)
[facility] Podiatry                                                      Patient Demographics  Paul [name], is a 53 y.o. male   MRN: [id number]   DOB - 22-Feb-1966  Admit Date - 07/25/2018    Outpatient Primary MD for the patient is Casilda Carls, MD  Consult requested in the Hospital by Dustin Flock, MD, On 07/25/2018    Reason for consult chronic ulcerations to both heels with likely osteomyelitis right heel   With History of -  Past Medical History:  Diagnosis Date  . Anemia   . Cardiac defibrillator in place    a. Biotronik LUmax 540 DRT, (ser # [id number]).  . Carotid arterial disease ([place])    a. s/p prior LICA stenting;  b. 06/7739 Carotid U/S: 40-59% bilat ICA stenosis. Patent LICA stent.  . CHF (congestive heart failure) ([place])   . CKD (chronic kidney disease), stage III ([place])   . Coronary artery disease    a. 2010 s/p CABG x 3.  . DDD (degenerative disc disease), lumbosacral    L5-S1  . Diabetes ([place])    Lantus at bedtime  . Gangrene of toe of right foot ([place])   . Gout of left hand 10/06/2016  . HFrEF (heart failure with reduced ejection fraction) ([place])    a. 07/2016 Echo: EF 25-30%, diff HK, mild MR, mildly dil LA, mod reduced RV fxn, PASP 66mmHg.  Marland Kitchen Hypertension    takes Coreg daily  . Ischemic cardiomyopathy    a. 07/2016 Echo: EF 25-30%, diff HK.  . Myocardial infarction ([place]) 2009  . Peripheral vascular disease ([place])   . Sleep apnea    sleep study yr ago-unable to afford cpap  . Stroke [facility]) 09   no weakness      Past Surgical History:  Procedure Laterality Date  . AMPUTATION TOE Right 08/22/2016   Procedure: AMPUTATION TOE;  Surgeon: Samara Deist, DPM;  Location: [facility] ORS;  Service: Podiatry;  Laterality: Right;  . AMPUTATION TOE Right 10/09/2016   Procedure: AMPUTATION TOE-RIGHT 2ND MPJ;  Surgeon: Samara Deist, DPM;  Location:  [facility] ORS;  Service: Podiatry;  Laterality: Right;  . APLIGRAFT PLACEMENT Right 10/09/2016   Procedure: APLIGRAFT PLACEMENT;  Surgeon: Samara Deist, DPM;  Location: [facility] ORS;  Service: Podiatry;  Laterality: Right;  . CARDIAC DEFIBRILLATOR PLACEMENT  2011  . CAROTID ENDARTERECTOMY Left   . CHOLECYSTECTOMY  2010  . CORONARY ARTERY BYPASS GRAFT  2010   CABG x 3   . GRAFT APPLICATION Right 2/87/8676   Procedure: FULL THICKNESS SKIN GRAFT-RIGHT FOOT;  Surgeon: Algernon Huxley, MD;  Location: [facility] ORS;  Service: Vascular;  Laterality: Right;  . HIP SURGERY Right 1994  . INCISION AND DRAINAGE OF WOUND Right 08/22/2016   Procedure: IRRIGATION AND DEBRIDEMENT WOUND and wound vac placement;  Surgeon: Samara Deist, DPM;  Location: [facility] ORS;  Service: Podiatry;  Laterality: Right;  . LOWER EXTREMITY ANGIOGRAPHY Right 08/24/2016   Procedure: Lower Extremity Angiography;  Surgeon: Algernon Huxley, MD;  Location: [place] CV LAB;  Service: Cardiovascular;  Laterality: Right;  . MASS EXCISION Right 07/18/2014   Procedure: EXCISION HETEROTOPIC BONE RIGHT HIP;  Surgeon: Frederik Pear, MD;  Location: [place];  Service: Orthopedics;  Laterality: Right;  . Open  Heart Surgery  2010   x 3  . WOUND DEBRIDEMENT Right 10/09/2016   Procedure: DEBRIDEMENT WOUND;  Surgeon: Samara Deist, DPM;  Location: [facility] ORS;  Service: Podiatry;  Laterality: Right;    in for   No chief complaint on file.    HPI  Paul [name]  is a 53 y.o. male, patient's had decubitus ulcers on both heels for a couple of months.  Has been treated with debridement in the office and a wound VAC was on antibiotics while he is in the hospital and shortly thereafter he continues to have progressive worsening of the right heel left heel is getting better.  My concerns are possible osteomyelitis in the right heel    Review of Systems    In addition to the HPI above,  No Fever-chills, No Headache, No changes with Vision or hearing, No problems  swallowing food or Liquids, No Chest pain, Cough or Shortness of Breath, No Abdominal pain, No Nausea or Vommitting, Bowel movements are regular, No Blood in stool or Urine, No dysuria, No new skin rashes or bruises, No new joints pains-aches,  No new weakness, tingling, numbness in any extremity, No recent weight gain or loss, No polyuria, polydypsia or polyphagia, No significant Mental Stressors.  A full 10 point Review of Systems was done, except as stated above, all other Review of Systems were negative.   Social History Social History   Tobacco Use  . Smoking status: Never Smoker  . Smokeless tobacco: Never Used  Substance Use Topics  . Alcohol use: No    Family History Family History  Problem Relation Age of Onset  . Hypertension Other   . Diabetes Other   . Diabetes Father   . Hyperlipidemia Father   . Hypertension Father   . Hyperlipidemia Sister   . Hypertension Sister   . Diabetes Sister   . Diabetes Brother   . Hypertension Brother   . Hyperlipidemia Brother     Prior to Admission medications   Medication Sig Start Date End Date Taking? Authorizing Provider  acetaminophen (TYLENOL) 325 MG tablet Take 2 tablets (650 mg total) by mouth every 6 (six) hours as needed for mild pain (or Fever >/= 101). 05/29/18   Loletha Grayer, MD  allopurinol (ZYLOPRIM) 100 MG tablet Take 0.5 tablets (50 mg total) by mouth daily. 05/30/18   Loletha Grayer, MD  apixaban (ELIQUIS) 5 MG TABS tablet Take 1 tablet (5 mg total) by mouth 2 (two) times daily. 05/29/18   Loletha Grayer, MD  aspirin EC 81 MG tablet Take 1 tablet (81 mg total) by mouth daily. 10/21/16   Theora Gianotti, NP  carvedilol (COREG) 3.125 MG tablet Take 1 tablet (3.125 mg total) by mouth 2 (two) times daily. 05/29/18   Loletha Grayer, MD  gabapentin (NEURONTIN) 400 MG capsule Take 400 mg by mouth 3 (three) times daily.  03/03/18   [provider]  insulin glargine (LANTUS) 100 unit/mL SOPN  Inject 0.2 mLs (20 Units total) into the skin at bedtime. 05/29/18   Loletha Grayer, MD  oxyCODONE 10 MG TABS Take 1 tablet (10 mg total) by mouth every 6 (six) hours as needed for moderate pain. 05/29/18   Loletha Grayer, MD  potassium chloride SA (K-DUR,KLOR-CON) 20 MEQ tablet Take 1 tablet (20 mEq total) by mouth daily. 05/30/18   Loletha Grayer, MD  predniSONE (DELTASONE) 5 MG tablet 3 tabs po daily for three days then 2 tabs po daily for three days then one tab  po daily afterwards 05/29/18   Loletha Grayer, MD  torsemide (DEMADEX) 20 MG tablet Take 2 tablets (40 mg total) by mouth 2 (two) times daily. 05/29/18   Loletha Grayer, MD    Anti-infectives (From admission, onward)   None      Scheduled Meds: Continuous Infusions: PRN Meds:.  Allergies  Allergen Reactions  . Ibuprofen Other (See Comments)    Heart problems  . Baclofen Other (See Comments)  . Metformin Diarrhea  . Nsaids     Due to kidney and heart problems per pt    Physical Exam  Vitals  Blood pressure (!) 157/96, pulse 84, temperature 98 F (36.7 C), temperature source Oral, resp. rate 18, SpO2 100 %.  Lower Extremity exam:  Vascular: Pulses are difficult to palpate on both feet.  He is had a history of angioplasty to the right lower extremity that has been over a year ago.  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  Dermatological: Granulating healing decubitus ulcer on the left heel with approximately 2.5 cm in diameter with 4 to 5 mm of depth.  On the right heel the wound is approximately 5.5 cm in length and 5.2 cm in width depth is penetrating down to the tendon level and is concerning for possible osteomyelitis in that heel.  No active drainage but still some recurring necrotic tissue developing on the area in spite of debridement the office with wound VAC material.  Patient has been on Bactrim DS.  Neurological: Peripheral neuropathy bilateral  Ortho: Some  early exposure to the Achilles tendon at the insertion site on the right heel consistent with possible osteomyelitis.  Data Review  CBC No results for input(s): WBC, HGB, HCT, PLT, MCV, MCH, MCHC, RDW, LYMPHSABS, MONOABS, EOSABS, BASOSABS, BANDABS in the last 168 hours.  Invalid input(s): NEUTRABS, BANDSABD ------------------------------------------------------------------------------------------------------------------  Chemistries  No results for input(s): NA, K, CL, CO2, GLUCOSE, BUN, CREATININE, CALCIUM, MG, AST, ALT, ALKPHOS, BILITOT in the last 168 hours.  Invalid input(s): GFRCGP ------------------------------------------------------------------------------------------------------------------ CrCl cannot be calculated (Patient's most recent lab result is older than the maximum 21 days allowed.). ------------------------------------------------------------------------------------------------------------------ No results for input(s): TSH, T4TOTAL, T3FREE, THYROIDAB in the last 72 hours.  Invalid input(s): FREET3 Urinalysis    Component Value Date/Time   COLORURINE YELLOW (A) 05/25/2018 0049   APPEARANCEUR CLEAR (A) 05/25/2018 0049   LABSPEC 1.013 05/25/2018 0049   PHURINE 5.0 05/25/2018 0049   GLUCOSEU 50 (A) 05/25/2018 0049   HGBUR NEGATIVE 05/25/2018 0049   BILIRUBINUR NEGATIVE 05/25/2018 0049   KETONESUR NEGATIVE 05/25/2018 0049   PROTEINUR NEGATIVE 05/25/2018 0049   UROBILINOGEN 1.0 07/16/2014 1558   NITRITE NEGATIVE 05/25/2018 0049   LEUKOCYTESUR NEGATIVE 05/25/2018 0049     Imaging results:   No results found.  Assessment & Plan: We will order a vascular consult hopefully they can maybe improve his blood flow to his right lower extremity before I have to do any surgery on it.  On his left one is stable at this point.  Also order x-rays and an MRI for the right foot in order to determine whether or not there is any osteomyelitis going on in the region based on  these results we will do appropriate surgical planning for this week.  Cultures from the office were positive for Proteus and that is a heavy growth and a light growth of MRSA that was resistant to both Bactrim and doxycycline. Active Problems:   Acute osteomyelitis of right foot (  [facility])   Family Communication: Plan discussed with patient  Albertine Patricia M.D on 07/25/2018 at 4:09 PM  Thank you for the consult, we will follow the patient with you in the Hospital.

## 2018-07-26 ENCOUNTER — Other Ambulatory Visit (INDEPENDENT_AMBULATORY_CARE_PROVIDER_SITE_OTHER): Payer: Self-pay | Admitting: Vascular Surgery

## 2018-07-26 DIAGNOSIS — I129 Hypertensive chronic kidney disease with stage 1 through stage 4 chronic kidney disease, or unspecified chronic kidney disease: Secondary | ICD-10-CM

## 2018-07-26 DIAGNOSIS — Z89421 Acquired absence of other right toe(s): Secondary | ICD-10-CM

## 2018-07-26 DIAGNOSIS — L97429 Non-pressure chronic ulcer of left heel and midfoot with unspecified severity: Secondary | ICD-10-CM

## 2018-07-26 DIAGNOSIS — R600 Localized edema: Secondary | ICD-10-CM

## 2018-07-26 DIAGNOSIS — I739 Peripheral vascular disease, unspecified: Secondary | ICD-10-CM

## 2018-07-26 DIAGNOSIS — I6523 Occlusion and stenosis of bilateral carotid arteries: Secondary | ICD-10-CM

## 2018-07-26 DIAGNOSIS — I2510 Atherosclerotic heart disease of native coronary artery without angina pectoris: Secondary | ICD-10-CM

## 2018-07-26 DIAGNOSIS — B964 Proteus (mirabilis) (morganii) as the cause of diseases classified elsewhere: Secondary | ICD-10-CM

## 2018-07-26 DIAGNOSIS — Z886 Allergy status to analgesic agent status: Secondary | ICD-10-CM

## 2018-07-26 DIAGNOSIS — Z95810 Presence of automatic (implantable) cardiac defibrillator: Secondary | ICD-10-CM

## 2018-07-26 DIAGNOSIS — L97419 Non-pressure chronic ulcer of right heel and midfoot with unspecified severity: Secondary | ICD-10-CM

## 2018-07-26 DIAGNOSIS — E11621 Type 2 diabetes mellitus with foot ulcer: Principal | ICD-10-CM

## 2018-07-26 DIAGNOSIS — Z888 Allergy status to other drugs, medicaments and biological substances status: Secondary | ICD-10-CM

## 2018-07-26 DIAGNOSIS — Z89411 Acquired absence of right great toe: Secondary | ICD-10-CM

## 2018-07-26 DIAGNOSIS — Z951 Presence of aortocoronary bypass graft: Secondary | ICD-10-CM

## 2018-07-26 DIAGNOSIS — B9562 Methicillin resistant Staphylococcus aureus infection as the cause of diseases classified elsewhere: Secondary | ICD-10-CM

## 2018-07-26 DIAGNOSIS — N189 Chronic kidney disease, unspecified: Secondary | ICD-10-CM

## 2018-07-26 DIAGNOSIS — E11628 Type 2 diabetes mellitus with other skin complications: Secondary | ICD-10-CM

## 2018-07-26 DIAGNOSIS — Z8619 Personal history of other infectious and parasitic diseases: Secondary | ICD-10-CM

## 2018-07-26 DIAGNOSIS — E1122 Type 2 diabetes mellitus with diabetic chronic kidney disease: Secondary | ICD-10-CM

## 2018-07-26 DIAGNOSIS — L089 Local infection of the skin and subcutaneous tissue, unspecified: Secondary | ICD-10-CM

## 2018-07-26 DIAGNOSIS — E1151 Type 2 diabetes mellitus with diabetic peripheral angiopathy without gangrene: Secondary | ICD-10-CM

## 2018-07-26 DIAGNOSIS — Z8631 Personal history of diabetic foot ulcer: Secondary | ICD-10-CM

## 2018-07-26 LAB — BASIC METABOLIC PANEL
Anion gap: 10 (ref 5–15)
BUN: 58 mg/dL — ABNORMAL HIGH (ref 6–20)
CALCIUM: 8.5 mg/dL — AB (ref 8.9–10.3)
CO2: 27 mmol/L (ref 22–32)
Chloride: 98 mmol/L (ref 98–111)
Creatinine, Ser: 2.53 mg/dL — ABNORMAL HIGH (ref 0.61–1.24)
GFR calc Af Amer: 32 mL/min — ABNORMAL LOW (ref >60)
GFR calc non Af Amer: 28 mL/min — ABNORMAL LOW (ref >60)
Glucose, Bld: 175 mg/dL — ABNORMAL HIGH (ref 70–99)
Potassium: 3.9 mmol/L (ref 3.5–5.1)
Sodium: 135 mmol/L (ref 135–145)

## 2018-07-26 LAB — GLUCOSE, CAPILLARY
GLUCOSE-CAPILLARY: 173 mg/dL — AB (ref 70–99)
Glucose-Capillary: 130 mg/dL — ABNORMAL HIGH (ref 70–99)
Glucose-Capillary: 146 mg/dL — ABNORMAL HIGH (ref 70–99)
Glucose-Capillary: 148 mg/dL — ABNORMAL HIGH (ref 70–99)

## 2018-07-26 LAB — CBC
HCT: 28.6 % — ABNORMAL LOW (ref 39.0–52.0)
Hemoglobin: 8.6 g/dL — ABNORMAL LOW (ref 13.0–17.0)
MCH: 24.9 pg — ABNORMAL LOW (ref 26.0–34.0)
MCHC: 30.1 g/dL (ref 30.0–36.0)
MCV: 82.7 fL (ref 80.0–100.0)
Platelets: 379 10*3/uL (ref 150–400)
RBC: 3.46 MIL/uL — ABNORMAL LOW (ref 4.22–5.81)
RDW: 18.5 % — AB (ref 11.5–15.5)
WBC: 17 10*3/uL — ABNORMAL HIGH (ref 4.0–10.5)
nRBC: 0 % (ref 0.0–0.2)

## 2018-07-26 MED ORDER — SODIUM CHLORIDE 0.9 % IV SOLN
1.5000 g | Freq: Four times a day (QID) | INTRAVENOUS | Status: DC
  Administered 2018-07-26 – 2018-07-29 (×9): 1.5 g via INTRAVENOUS
  Filled 2018-07-26 (×12): qty 1.5

## 2018-07-26 NOTE — Progress Notes (Signed)
Dr. Tressia Miners gave the okay for patient to have normal dressing with his salad for once. Patient is requesting the normal dressing with meals instead of fat free but is to be on heart healthy/carb modified diet.   Harvest Dark, RN

## 2018-07-26 NOTE — Progress Notes (Signed)
[facility] Podiatry                                                      Patient Demographics  Paul [name], is a 53 y.o. male   MRN: [id number]   DOB - April 23, 1966  Admit Date - 07/25/2018    Outpatient Primary MD for the patient is Casilda Carls, MD  Consult requested in the Hospital by Gladstone Lighter, MD, On 07/26/2018    With History of -  Past Medical History:  Diagnosis Date  . Anemia   . Cardiac defibrillator in place    a. Biotronik LUmax 540 DRT, (ser # [id number]).  . Carotid arterial disease ([place])    a. s/p prior LICA stenting;  b. 08/7094 Carotid U/S: 40-59% bilat ICA stenosis. Patent LICA stent.  . CHF (congestive heart failure) ([place])   . CKD (chronic kidney disease), stage III ([place])   . Coronary artery disease    a. 2010 s/p CABG x 3.  . DDD (degenerative disc disease), lumbosacral    L5-S1  . Diabetes ([place])    Lantus at bedtime  . Gangrene of toe of right foot ([place])   . Gout of left hand 10/06/2016  . HFrEF (heart failure with reduced ejection fraction) ([place])    a. 07/2016 Echo: EF 25-30%, diff HK, mild MR, mildly dil LA, mod reduced RV fxn, PASP 57mmHg.  Marland Kitchen Hypertension    takes Coreg daily  . Ischemic cardiomyopathy    a. 07/2016 Echo: EF 25-30%, diff HK.  . Myocardial infarction ([place]) 2009  . Peripheral vascular disease ([place])   . Sleep apnea    sleep study yr ago-unable to afford cpap  . Stroke [facility]) 09   no weakness      Past Surgical History:  Procedure Laterality Date  . AMPUTATION TOE Right 08/22/2016   Procedure: AMPUTATION TOE;  Surgeon: Samara Deist, DPM;  Location: [facility] OR[facility];  Service: Podiatry;  Laterality: Right;  . AMPUTATION TOE Right 10/09/2016   Procedure: AMPUTATION TOE-RIGHT 2ND MPJ;  Surgeon: Samara Deist, DPM;  Location: [facility];  Service: Podiatry;  Laterality: Right;  . APLIGRAFT PLACEMENT Right 10/09/2016    Procedure: APLIGRAFT PLACEMENT;  Surgeon: Samara Deist, DPM;  Location: [facility];  Service: Podiatry;  Laterality: Right;  . CARDIAC DEFIBRILLATOR PLACEMENT  2011  . CAROTID ENDARTERECTOMY Left   . CHOLECYSTECTOMY  2010  . CORONARY ARTERY BYPASS GRAFT  2010   CABG x 3   . GRAFT APPLICATION Right 2/83/6629   Procedure: FULL THICKNESS SKIN GRAFT-RIGHT FOOT;  Surgeon: Algernon Huxley, MD;  Location: [facility] OR[facility];  Service: Vascular;  Laterality: Right;  . HIP SURGERY Right 1994  . INCISION AND DRAINAGE OF WOUND Right 08/22/2016   Procedure: IRRIGATION AND DEBRIDEMENT WOUND and wound vac placement;  Surgeon: Samara Deist, DPM;  Location: [facility] OR[facility];  Service: Podiatry;  Laterality: Right;  . LOWER EXTREMITY ANGIOGRAPHY Right 08/24/2016   Procedure: Lower Extremity Angiography;  Surgeon: Algernon Huxley, MD;  Location: [place] CV [facility];  Service: Cardiovascular;  Laterality: Right;  . MASS EXCISION Right 07/18/2014   Procedure: EXCISION HETEROTOPIC BONE RIGHT HIP;  Surgeon: Frederik Pear, MD;  Location: [place];  Service: Orthopedics;  Laterality: Right;  . Open Heart Surgery  2010   x 3  . WOUND DEBRIDEMENT Right 10/09/2016  Procedure: DEBRIDEMENT WOUND;  Surgeon: Samara Deist, DPM;  Location: [facility] OR[facility];  Service: Podiatry;  Laterality: Right;    in for   No chief complaint on file.    HPI  Paul [name]  is a 53 y.o. male, patient has chronic history of bilateral decubitus ulcers on his heels.  He is diabetic as well.  Seen in the hospital by me approximately 5 or 6 weeks ago is undergone a couple of debridements has been on a wound VAC.  He has gradually worsened and shown a lack of healing to the areas.  The left heel is doing better than the right heel.    Review of Systems    In addition to the HPI above,  No Fever-chills, No Headache, No changes with Vision or hearing, No problems swallowing food or Liquids, No Chest pain, Cough or Shortness of Breath, No Abdominal pain, No Nausea or  Vommitting, Bowel movements are regular, No Blood in stool or Urine, No dysuria, No new skin rashes or bruises, No new joints pains-aches,  No new weakness, tingling, numbness in any extremity, No recent weight gain or loss, No polyuria, polydypsia or polyphagia, No significant Mental Stressors.  A full 10 point Review of Systems was done, except as stated above, all other Review of Systems were negative.   Social History Social History   Tobacco Use  . Smoking status: Never Smoker  . Smokeless tobacco: Never Used  Substance Use Topics  . Alcohol use: No    Family History Family History  Problem Relation Age of Onset  . Hypertension Other   . Diabetes Other   . Diabetes Father   . Hyperlipidemia Father   . Hypertension Father   . Hyperlipidemia Sister   . Hypertension Sister   . Diabetes Sister   . Diabetes Brother   . Hypertension Brother   . Hyperlipidemia Brother     Prior to Admission medications   Medication Sig Start Date End Date Taking? Authorizing Provider  acetaminophen (TYLENOL) 325 MG tablet Take 2 tablets (650 mg total) by mouth every 6 (six) hours as needed for mild pain (or Fever >/= 101). 05/29/18   Loletha Grayer, MD  allopurinol (ZYLOPRIM) 100 MG tablet Take 0.5 tablets (50 mg total) by mouth daily. 05/30/18   Loletha Grayer, MD  apixaban (ELIQUIS) 5 MG TABS tablet Take 1 tablet (5 mg total) by mouth 2 (two) times daily. Patient not taking: Reported on 07/25/2018 05/29/18   Loletha Grayer, MD  aspirin EC 81 MG tablet Take 1 tablet (81 mg total) by mouth daily. 10/21/16   Theora Gianotti, NP  carvedilol (COREG) 3.125 MG tablet Take 1 tablet (3.125 mg total) by mouth 2 (two) times daily. 05/29/18   Loletha Grayer, MD  gabapentin (NEURONTIN) 400 MG capsule Take 400 mg by mouth 3 (three) times daily.  03/03/18   [provider]  insulin glargine (LANTUS) 100 unit/mL SOPN Inject 0.2 mLs (20 Units total) into the skin at  bedtime. Patient taking differently: Inject 30 Units into the skin at bedtime.  05/29/18   Loletha Grayer, MD  oxyCODONE 10 MG TABS Take 1 tablet (10 mg total) by mouth every 6 (six) hours as needed for moderate pain. 05/29/18   Loletha Grayer, MD  potassium chloride SA (K-DUR,KLOR-CON) 20 MEQ tablet Take 1 tablet (20 mEq total) by mouth daily. Patient not taking: Reported on 07/25/2018 05/30/18   Loletha Grayer, MD  predniSONE (DELTASONE) 5 MG tablet 3 tabs po daily for  three days then 2 tabs po daily for three days then one tab po daily afterwards 05/29/18   Loletha Grayer, MD  torsemide (DEMADEX) 20 MG tablet Take 2 tablets (40 mg total) by mouth 2 (two) times daily. 05/29/18   Loletha Grayer, MD    Anti-infectives (From admission, onward)   Start     Dose/Rate Route Frequency Ordered Stop   07/27/18 1000  vancomycin (VANCOCIN) 2,250 mg in sodium chloride 0.9 % 500 mL IVPB     2,250 mg 250 mL/hr over 120 Minutes Intravenous Every 48 hours 07/25/18 1839     07/26/18 0000  piperacillin-tazobactam (ZOSYN) IVPB 3.375 g     3.375 g 12.5 mL/hr over 240 Minutes Intravenous Every 8 hours 07/25/18 1839     07/25/18 1730  piperacillin-tazobactam (ZOSYN) IVPB 3.375 g     3.375 g 100 mL/hr over 30 Minutes Intravenous  Once 07/25/18 1717 07/25/18 1906   07/25/18 1730  vancomycin (VANCOCIN) 1,000 mg in sodium chloride 0.9 % 250 mL IVPB  Status:  Discontinued     1,000 mg 250 mL/hr over 60 Minutes Intravenous  Once 07/25/18 1717 07/25/18 1720   07/25/18 1730  vancomycin (VANCOCIN) 2,000 mg in sodium chloride 0.9 % 500 mL IVPB     2,000 mg 250 mL/hr over 120 Minutes Intravenous  Once 07/25/18 1720 07/25/18 2139      Scheduled Meds: . allopurinol  50 mg Oral Daily  . aspirin EC  81 mg Oral Daily  . carvedilol  3.125 mg Oral BID  . gabapentin  400 mg Oral TID  . insulin aspart  0-9 Units Subcutaneous TID WC  . insulin glargine  30 Units Subcutaneous QHS  . predniSONE  5 mg Oral Q breakfast   . torsemide  40 mg Oral BID   Continuous Infusions: . sodium chloride Stopped (07/26/18 0009)  . piperacillin-tazobactam (ZOSYN)  IV 3.375 g (07/26/18 0455)  . [START ON 07/27/2018] vancomycin     PRN Meds:.acetaminophen **OR** acetaminophen, oxyCODONE  Allergies  Allergen Reactions  . Ibuprofen Other (See Comments)    Heart problems  . Baclofen Other (See Comments)  . Metformin Diarrhea  . Nsaids     Due to kidney and heart problems per pt    Physical Exam  Vitals  Blood pressure (!) 106/59, pulse 86, temperature 99.7 F (37.6 C), temperature source Oral, resp. rate 19, height 5\' 10"  (1.778 m), weight 94.8 kg, SpO2 97 %.  Lower Extremity exam:  Vascular: Difficult to palpate.  Patient had a transmetatarsal amputation on the right for the last year.  Dermatological: Bilateral decubitus ulcers large ulcerations on both heels left has more granular tissue but still a few necrotic areas.  Right has significant destruction of soft tissue underlying fat and has progressed to the tendon and based on CT scan yesterday involves very early portion of the posterior heel bone.  Neurological: Diabetic neuropathy  Ortho: CT scan showed likely early osteomyelitis in the posterior calcaneus on the right.  There is really very little bony or cortical destruction in the region is noted but there is deep wound that penetrates to that region and some very early cortical changes to the posterior calcaneus.  Data Review  CBC Recent Labs  Lab 07/25/18 1750 07/26/18 0439  WBC 21.4* 17.0*  HGB 10.2* 8.6*  HCT 33.7* 28.6*  PLT 429* 379  MCV 82.4 82.7  MCH 24.9* 24.9*  MCHC 30.3 30.1  RDW 18.4* 18.5*   ------------------------------------------------------------------------------------------------------------------  Chemistries  Recent  Labs  Lab 07/25/18 1750 07/26/18 0439  NA 135 135  K 4.3 3.9  CL 97* 98  CO2 27 27  GLUCOSE 255* 175*  BUN 62* 58*  CREATININE 2.70* 2.53*   CALCIUM 8.8* 8.5*   ------------------------------------------------------------------------------------------------------------------ estimated creatinine clearance is 39 mL/min (A) (by C-G formula based on SCr of 2.53 mg/dL (H)). ------------------------------------------------------------------------------------------------------------------ No results for input(s): TSH, T4TOTAL, T3FREE, THYROIDAB in the last 72 hours.  Invalid input(s): FREET3 Urinalysis    Component Value Date/Time   COLORURINE YELLOW (A) 05/25/2018 0049   APPEARANCEUR CLEAR (A) 05/25/2018 0049   LABSPEC 1.013 05/25/2018 0049   PHURINE 5.0 05/25/2018 0049   GLUCOSEU 50 (A) 05/25/2018 0049   HGBUR NEGATIVE 05/25/2018 0049   BILIRUBINUR NEGATIVE 05/25/2018 0049   KETONESUR NEGATIVE 05/25/2018 0049   PROTEINUR NEGATIVE 05/25/2018 0049   UROBILINOGEN 1.0 07/16/2014 1558   NITRITE NEGATIVE 05/25/2018 0049   LEUKOCYTESUR NEGATIVE 05/25/2018 0049     Imaging results:   Ct Ankle Right Wo Contrast  Result Date: 07/25/2018 CLINICAL DATA:  Heel ulcer measuring 5.5 cm in length by 5.2 cm in width penetrating to the tendon level and concerning for osteomyelitis of the heel. EXAM: CT OF THE RIGHT ANKLE WITHOUT CONTRAST TECHNIQUE: Multidetector CT imaging of the right ankle was performed according to the standard protocol. Multiplanar CT image reconstructions were also generated. COMPARISON:  CT in the foot 05/25/2018 FINDINGS: Bones/Joint/Cartilage There has been subtle cortical bone loss involving the dorsal cortex of the calcaneus, series 6/images 47 through 51. This in conjunction with a new soft tissue ulcer overlying the dorsal aspect of the calcaneus is concerning for an evolving osteomyelitis. Tibiotalar, subtalar and included midfoot articulations are preserved. Ligaments Suboptimally assessed by CT. Muscles and Tendons Atrophy of the intrinsic musculature of the included foot. No intramuscular fluid collection. No  tendon tear. Soft tissues Redemonstration of subcutaneous soft tissue edema of the included ankle and midfoot consistent with cellulitis and/or third spacing. Soft tissue ulceration extends down to the posterior calcaneal cortex since prior exam. IMPRESSION: 1. New soft tissue ulceration overlying the dorsum of the calcaneus extending down to the bone. 2. Subtle new cortical bone loss of the posterior aspect of the calcaneus, deep to the ulceration raises concern for evolving osteomyelitis. These results will be called to the ordering clinician or representative by the Radiologist Assistant, and communication documented in the PACS or zVision Dashboard. Electronically Signed   By: Ashley Royalty M.D.   On: 07/25/2018 17:16   Dg Foot Complete Right  Result Date: 07/25/2018 CLINICAL DATA:  Decubitus ulcers on the bilateral heels. Worsening on the right with improvement on the left. EXAM: RIGHT FOOT COMPLETE - 3+ VIEW COMPARISON:  None. FINDINGS: The patient is status post amputation of the distal foot. Vascular calcifications are noted. There is an ulcer posterior to the calcaneus seen on the lateral view. The adjacent calcaneus demonstrates no erosion. IMPRESSION: 1. No convincing evidence of osteomyelitis in the posterior calcaneus adjacent to the patient's ulcer. An MRI would be more sensitive if concern persists. Electronically Signed   By: Dorise Bullion III M.D   On: 07/25/2018 16:45    Assessment & Plan: I ordered consults for vascular hopefully will build to do something to improve blood flow to the right foot in particular.  Once we get that established I will set him up for surgery to debride the right heel and the left heel and reestablish the wound vacs.  He will likely need partial calcanectomy  on the right heel to promote some closure to the region as well as get rid of the infected bone in the area.  His white count was significantly elevated yesterday but is come down some today.  Active  Problems:   Acute osteomyelitis of right foot ([place])   Osteomyelitis ([place])   Family Communication: Plan discussed with patient   Albertine Patricia M.D on 07/26/2018 at 9:41 AM  Thank you for the consult, we will follow the patient with you in the Hospital.

## 2018-07-26 NOTE — Consult Note (Signed)
[facility] Vascular Consult Note  MRN : [id number]  Paul [name]. is a 53 y.o. (1965/11/29) male who presents with chief complaint of bilateral heel ulcerations.  History of Present Illness:  The patient is a 53 year old male with a past medical history of CVA (2009), sleep apnea however is noncompliant with CPAP, myocardial infarction, hypertension, degenerative joint disease of the lumbosacral spine, coronary artery disease, chronic kidney disease, congestive heart failure, carotid artery disease and peripheral vascular disease.  The patient is known to our service.  The patient underwent a right lower extremity angiogram with intervention on August 24, 2016 with Dr. Lucky Cowboy.  The patient also underwent a right lower extremity skin graft application on December 16, 2016 with Dr. Lucky Cowboy.  The patient was lost to follow-up stating that he had moved to [facility]  Patient was admitted after being seen by his podiatrist Dr. Elvina Mattes who has been treating his chronic bilateral heel wounds.  The patient has undergone debridement with VAC therapy.  During his most recent podiatry visit, the patient was noted to have worsening right heel ulcerations / possible osteomyelitis.  The left heel wound shows signs of granulation.  The patient denies any fever, nausea or vomiting.  Patient denies any chest pain or shortness of breath.  07/25/18 CT Right Foot:  1. New soft tissue ulceration overlying the dorsum of the calcaneus extending down to the bone. 2. Subtle new cortical bone loss of the posterior aspect of the calcaneus, deep to the ulceration raises concern for evolving osteomyelitis.  07/25/18 Xray Right Foot: 1. No convincing evidence of osteomyelitis in the posterior calcaneus adjacent to the patient's ulcer.   Of note the patients last carotid duplex was completed on 09/04/2016 by [facility] and notable for: 1) Heterogeneous plaque, bilaterally. 2) 40-59% bilateral  ICA stenosis, s/p left carotid stent. 3) >50% LECA stenosis. 4) Patent vertebral arteries with antegrade flow. 5) Normal subclavian arteries, bilaterally.  Patient was recommended to follow-up in one year for continued surveillance however he has not done this.  He denies any neurological symptoms/deficits or amaurosis fugax at this time.  Vascular Surgery was consulted by Dr. Elvina Mattes for possible endovascular intervention   Current Facility-Administered Medications  Medication Dose Route Frequency Provider Last Rate Last Dose  . 0.9 %  sodium chloride infusion   Intravenous Continuous Dustin Flock, MD   Stopped at 07/26/18 0009  . acetaminophen (TYLENOL) tablet 650 mg  650 mg Oral Q6H PRN Dustin Flock, MD       Or  . acetaminophen (TYLENOL) suppository 650 mg  650 mg Rectal Q6H PRN Dustin Flock, MD      . allopurinol (ZYLOPRIM) tablet 50 mg  50 mg Oral Daily Dustin Flock, MD   50 mg at 07/26/18 1001  . aspirin EC tablet 81 mg  81 mg Oral Daily Dustin Flock, MD   81 mg at 07/26/18 1001  . carvedilol (COREG) tablet 3.125 mg  3.125 mg Oral BID Dustin Flock, MD   3.125 mg at 07/26/18 1001  . gabapentin (NEURONTIN) capsule 400 mg  400 mg Oral TID Dustin Flock, MD   400 mg at 07/26/18 1001  . insulin aspart (novoLOG) injection 0-9 Units  0-9 Units Subcutaneous TID WC Dustin Flock, MD   1 Units at 07/26/18 [phone]  . insulin glargine (LANTUS) injection 30 Units  30 Units Subcutaneous QHS Dustin Flock, MD   30 Units at 07/25/18 2103  . oxyCODONE (Oxy IR/ROXICODONE) immediate release  tablet 10 mg  10 mg Oral Q6H PRN Dustin Flock, MD   10 mg at 07/26/18 0449  . piperacillin-tazobactam (ZOSYN) IVPB 3.375 g  3.375 g Intravenous Q8H Lu Duffel, RPH 12.5 mL/hr at 07/26/18 0455 3.375 g at 07/26/18 0455  . predniSONE (DELTASONE) tablet 5 mg  5 mg Oral Q breakfast Dustin Flock, MD   5 mg at 07/26/18 2233  . torsemide (DEMADEX) tablet 40 mg  40 mg Oral BID Dustin Flock, MD   40 mg at 07/26/18 6122  . [START ON 07/27/2018] vancomycin (VANCOCIN) 2,250 mg in sodium chloride 0.9 % 500 mL IVPB  2,250 mg Intravenous Q48H Shanlever, Pierce Crane, [facility]       Past Medical History:  Diagnosis Date  . Anemia   . Cardiac defibrillator in place    a. Biotronik LUmax 540 DRT, (ser # [id number]).  . Carotid arterial disease ([place])    a. s/p prior LICA stenting;  b. 09/1100 Carotid U/S: 40-59% bilat ICA stenosis. Patent LICA stent.  . CHF (congestive heart failure) ([place])   . CKD (chronic kidney disease), stage III ([place])   . Coronary artery disease    a. 2010 s/p CABG x 3.  . DDD (degenerative disc disease), lumbosacral    L5-S1  . Diabetes ([place])    Lantus at bedtime  . Gangrene of toe of right foot ([place])   . Gout of left hand 10/06/2016  . HFrEF (heart failure with reduced ejection fraction) ([place])    a. 07/2016 Echo: EF 25-30%, diff HK, mild MR, mildly dil LA, mod reduced RV fxn, PASP 105mmHg.  Marland Kitchen Hypertension    takes Coreg daily  . Ischemic cardiomyopathy    a. 07/2016 Echo: EF 25-30%, diff HK.  . Myocardial infarction ([place]) 2009  . Peripheral vascular disease ([place])   . Sleep apnea    sleep study yr ago-unable to afford cpap  . Stroke [facility]) 09   no weakness   Past Surgical History:  Procedure Laterality Date  . AMPUTATION TOE Right 08/22/2016   Procedure: AMPUTATION TOE;  Surgeon: Samara Deist, DPM;  Location: [facility] ORS;  Service: Podiatry;  Laterality: Right;  . AMPUTATION TOE Right 10/09/2016   Procedure: AMPUTATION TOE-RIGHT 2ND MPJ;  Surgeon: Samara Deist, DPM;  Location: [facility] OR[facility];  Service: Podiatry;  Laterality: Right;  . APLIGRAFT PLACEMENT Right 10/09/2016   Procedure: APLIGRAFT PLACEMENT;  Surgeon: Samara Deist, DPM;  Location: [facility] OR[facility];  Service: Podiatry;  Laterality: Right;  . CARDIAC DEFIBRILLATOR PLACEMENT  2011  . CAROTID ENDARTERECTOMY Left   . CHOLECYSTECTOMY  2010  . CORONARY ARTERY BYPASS GRAFT  2010   CABG x 3   . GRAFT APPLICATION  Right 05/14/7354   Procedure: FULL THICKNESS SKIN GRAFT-RIGHT FOOT;  Surgeon: Algernon Huxley, MD;  Location: [facility] OR[facility];  Service: Vascular;  Laterality: Right;  . HIP SURGERY Right 1994  . INCISION AND DRAINAGE OF WOUND Right 08/22/2016   Procedure: IRRIGATION AND DEBRIDEMENT WOUND and wound vac placement;  Surgeon: Samara Deist, DPM;  Location: [facility] OR[facility];  Service: Podiatry;  Laterality: Right;  . LOWER EXTREMITY ANGIOGRAPHY Right 08/24/2016   Procedure: Lower Extremity Angiography;  Surgeon: Algernon Huxley, MD;  Location: [facility];  Service: Cardiovascular;  Laterality: Right;  . MASS EXCISION Right 07/18/2014   Procedure: EXCISION HETEROTOPIC BONE RIGHT HIP;  Surgeon: Frederik Pear, MD;  Location: [place];  Service: Orthopedics;  Laterality: Right;  . Open Heart Surgery  2010   x 3  .  WOUND DEBRIDEMENT Right 10/09/2016   Procedure: DEBRIDEMENT WOUND;  Surgeon: Samara Deist, DPM;  Location: [facility];  Service: Podiatry;  Laterality: Right;   Social History Social History   Tobacco Use  . Smoking status: Never Smoker  . Smokeless tobacco: Never Used  Substance Use Topics  . Alcohol use: No  . Drug use: No   Family History Family History  Problem Relation Age of Onset  . Hypertension Other   . Diabetes Other   . Diabetes Father   . Hyperlipidemia Father   . Hypertension Father   . Hyperlipidemia Sister   . Hypertension Sister   . Diabetes Sister   . Diabetes Brother   . Hypertension Brother   . Hyperlipidemia Brother   Denies any family history of peripheral artery disease, venous disease and/or bleeding/clotting disorders.  Allergies  Allergen Reactions  . Ibuprofen Other (See Comments)    Heart problems  . Baclofen Other (See Comments)  . Metformin Diarrhea  . Nsaids     Due to kidney and heart problems per pt   REVIEW OF SYSTEMS (Negative unless checked)  Constitutional: [] Weight loss  [] Fever  [] Chills Cardiac: [] Chest pain   [] Chest pressure   [] Palpitations    [] Shortness of breath when laying flat   [] Shortness of breath at rest   [] Shortness of breath with exertion. Vascular:  [] Pain in legs with walking   [] Pain in legs at rest   [] Pain in legs when laying flat   [] Claudication   [] Pain in feet when walking  [] Pain in feet at rest  [] Pain in feet when laying flat   [] History of DVT   [] Phlebitis   [x] Swelling in legs   [] Varicose veins   [] Non-healing ulcers Pulmonary:   [] Uses home oxygen   [] Productive cough   [] Hemoptysis   [] Wheeze  [] COPD   [] Asthma Neurologic:  [] Dizziness  [] Blackouts   [] Seizures   [x] History of stroke   [] History of TIA  [] Aphasia   [] Temporary blindness   [] Dysphagia   [] Weakness or numbness in arms   [] Weakness or numbness in legs Musculoskeletal:  [] Arthritis   [] Joint swelling   [] Joint pain   [] Low back pain Hematologic:  [] Easy bruising  [] Easy bleeding   [] Hypercoagulable state   [] Anemic  [] Hepatitis Gastrointestinal:  [] Blood in stool   [] Vomiting blood  [] Gastroesophageal reflux/heartburn   [] Difficulty swallowing. Genitourinary:  [x] Chronic kidney disease   [] Difficult urination  [] Frequent urination  [] Burning with urination   [] Blood in urine Skin:  [] Rashes   [x] Ulcers   [x] Wounds Psychological:  [] History of anxiety   []  History of major depression.  Physical Examination  Vitals:   07/25/18 1603 07/25/18 1800 07/25/18 2320 07/26/18 0726  BP: (!) 157/96  103/65 (!) 106/59  Pulse: 84  78 86  Resp: 18  17 19   Temp: 98 F (36.7 C)  98.3 F (36.8 C) 99.7 F (37.6 C)  TempSrc: Oral  Oral Oral  SpO2: 100%  97% 97%  Weight:  94.8 kg    Height:  5\' 10"  (1.778 m)     Body mass index is 29.99 kg/m. Gen:  WD/WN, NAD Head: [place]/AT, No temporalis wasting. Prominent temp pulse not noted. Ear/Nose/Throat: Hearing grossly intact, nares w/o erythema or drainage, oropharynx w/o Erythema/Exudate Eyes: Sclera non-icteric, conjunctiva clear Neck: Trachea midline.  No JVD.  Pulmonary:  Good air movement, respirations  not labored, equal bilaterally.  Cardiac: RRR, normal S1, S2. Vascular:  Vessel Right Left  Radial Palpable Palpable  Ulnar Palpable Palpable  Brachial Palpable Palpable  Carotid Palpable, without bruit Palpable, without bruit  Aorta Not palpable N/A  Femoral Palpable Palpable  Popliteal Palpable Palpable  PT Non-Palpable Non-Palpable  DP Non-Palpable Non-Palpable   Right lower extremity: thigh soft, calf soft. Right foot dressing intact, clean and dry. Moderate edema with skin fibrosis noted. Hard to palpate pedal pulses. Foot is warm. Transmetatasal site is intact.   Left lower Extremity: thigh soft, calf soft. Left foot dressing intact, clean and dry. Moderate edema with skin fibrosis noted. Hard to palpate pedal pulses. Foot is warm. Small ulceration to second toe.   Gastrointestinal: soft, non-tender/non-distended. No guarding/reflex.  Musculoskeletal: M/S 5/5 throughout.   Neurologic: Sensation grossly intact in extremities.  Symmetrical.  Speech is fluent. Motor exam as listed above. Psychiatric: Judgment intact, Mood & affect appropriate for pt's clinical situation. Dermatologic: As above Lymph : No Cervical, Axillary, or Inguinal lymphadenopathy.  CBC Lab Results  Component Value Date   WBC 17.0 (H) 07/26/2018   HGB 8.6 (L) 07/26/2018   HCT 28.6 (L) 07/26/2018   MCV 82.7 07/26/2018   PLT 379 07/26/2018   BMET    Component Value Date/Time   NA 135 07/26/2018 0439   NA 139 06/23/2016 1553   K 3.9 07/26/2018 0439   CL 98 07/26/2018 0439   CO2 27 07/26/2018 0439   GLUCOSE 175 (H) 07/26/2018 0439   BUN 58 (H) 07/26/2018 0439   BUN 46 (H) 06/23/2016 1553   CREATININE 2.53 (H) 07/26/2018 0439   CALCIUM 8.5 (L) 07/26/2018 0439   GFRNONAA 28 (L) 07/26/2018 0439   GFRAA 32 (L) 07/26/2018 0439   Estimated Creatinine Clearance: 39 mL/min (A) (by C-G formula based on SCr of 2.53 mg/dL (H)).  COAG Lab Results  Component Value Date   INR 1.18 05/25/2018   INR 1.18  07/16/2014   Radiology Ct Ankle Right Wo Contrast  Result Date: 07/25/2018 CLINICAL DATA:  Heel ulcer measuring 5.5 cm in length by 5.2 cm in width penetrating to the tendon level and concerning for osteomyelitis of the heel. EXAM: CT OF THE RIGHT ANKLE WITHOUT CONTRAST TECHNIQUE: Multidetector CT imaging of the right ankle was performed according to the standard protocol. Multiplanar CT image reconstructions were also generated. COMPARISON:  CT in the foot 05/25/2018 FINDINGS: Bones/Joint/Cartilage There has been subtle cortical bone loss involving the dorsal cortex of the calcaneus, series 6/images 47 through 51. This in conjunction with a new soft tissue ulcer overlying the dorsal aspect of the calcaneus is concerning for an evolving osteomyelitis. Tibiotalar, subtalar and included midfoot articulations are preserved. Ligaments Suboptimally assessed by CT. Muscles and Tendons Atrophy of the intrinsic musculature of the included foot. No intramuscular fluid collection. No tendon tear. Soft tissues Redemonstration of subcutaneous soft tissue edema of the included ankle and midfoot consistent with cellulitis and/or third spacing. Soft tissue ulceration extends down to the posterior calcaneal cortex since prior exam. IMPRESSION: 1. New soft tissue ulceration overlying the dorsum of the calcaneus extending down to the bone. 2. Subtle new cortical bone loss of the posterior aspect of the calcaneus, deep to the ulceration raises concern for evolving osteomyelitis. These results will be called to the ordering clinician or representative by the Radiologist Assistant, and communication documented in the PACS or zVision Dashboard. Electronically Signed   By: Ashley Royalty M.D.   On: 07/25/2018 17:16   Dg Foot Complete Right  Result Date: 07/25/2018 CLINICAL DATA:  Decubitus ulcers on the bilateral heels. Worsening  on the right with improvement on the left. EXAM: RIGHT FOOT COMPLETE - 3+ VIEW COMPARISON:  None.  FINDINGS: The patient is status post amputation of the distal foot. Vascular calcifications are noted. There is an ulcer posterior to the calcaneus seen on the lateral view. The adjacent calcaneus demonstrates no erosion. IMPRESSION: 1. No convincing evidence of osteomyelitis in the posterior calcaneus adjacent to the patient's ulcer. An MRI would be more sensitive if concern persists. Electronically Signed   By: Dorise Bullion III M.D   On: 07/25/2018 16:45   Assessment/Plan The patient is a 53 year old male with a past medical history of CVA (2009), sleep apnea however is noncompliant with CPAP, myocardial infarction, hypertension, degenerative joint disease of the lumbosacral spine, coronary artery disease, chronic kidney disease, congestive heart failure, carotid artery disease and peripheral vascular disease. 1. PAD: The patient underwent a right lower extremity angiogram with intervention on August 24, 2016 with Dr. Lucky Cowboy.  The patient was lost to follow-up stating that he had moved to [facility].  Patient presented to his podiatry appointment with bilateral chronic heel wounds.  Right heel wound was noted to be progressively worsening with the possibility of right heel osteomyelitis.  Left heel show some signs of granulation to the wound bed.  Patient has multiple risk factors for peripheral artery disease requiring endovascular intervention in the past.  In the setting of worsening right heel ulceration with possibility of osteomyelitis recommend a right lower extremity angiogram with possible intervention to assess the patient's anatomy and degree of peripheral artery disease.  If appropriate, an attempt to revascularize the leg can be made at that time.  Procedure, risks and benefits explained to the patient.  All questions answered.  The patient wishes to proceed. 2. Carotid Artery Stenosis: Patient with a past medical history of carotid stenosis requiring left carotid stent placement.  Patient's last  carotid duplex was on 09/04/2016 conducted by [facility] and notable for 1) Heterogeneous plaque, bilaterally. 2) 40-59% bilateral ICA stenosis, s/p left carotid stent. 3) >50% LECA stenosis. 4) Patent vertebral arteries with antegrade flow. 5) Normal subclavian arteries, bilaterally.  The patient was recommended to follow-up on yearly basis.  The patient has not had a recent carotid duplex.  He is currently asymptomatic however it has been almost 2 years since he underwent any studies.  Will order a carotid duplex while he is in house to assess for any progressing disease. 3.  Lower extremity edema: On physical exam patient clearly has fibrotic skin changes due to chronic venous insufficiency versus lymphedema.  Controlling the patient's lower extremity edema may help with wound healing moving forward.  We will be happy to see him in the outpatient setting and have him undergo a bilateral lower extremity venous duplex to rule out venous versus lymphatic disease.  Encouraged elevation of his legs at this time.  Discussed with Dr. Lucky Cowboy / Dr. Francene Castle, PA-C  07/26/2018 10:23 AM    This note was created with Dragon medical transcription system.  Any error is purely unintentional

## 2018-07-26 NOTE — Consult Note (Signed)
NAME: Paul [name].  DOB: 1965/06/14  MRN: [id number]  Date/Time: 07/26/2018 3:04 PM  REQUESTING PROVIDER:Dr.Troxler Subjective:  REASON FOR CONSULT: B/l heel ulcers ?History from patient and also reviewed records in care everywhere Paul [name]. is a 53 y.o. 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   PT was in a hospital in [place] ion Sept 2019 ( 01/1818) with syncope, he was noted to have high fever, had MSSA bacteremia, infection of the rt hallux for which he ahd amputation, The wound culture was positive for MSSA, TEE was negative. He was seen by ID and sent to rehab to complete 6 weeks of IV cefazolin.  He had a power line ( because of CKD) Because of worsening creatinine ( from 1.88 to 3.03) cefazolin was changed to Daptomycin. He was in rehab until Jan 2020. During that time he was in bed a lot and his heels were touching the foot board and he says that started the ulcers. He was admitted to [facility] 1/21-1/26 with b/l heel wounds and was treated with IV initially and then discharged on PO. He was followed by Troxler as OP and had debridement. Culture had proteus. Last cultures sent from 3/18 had MRSA proteus  Medical history CAD s/p CABG Gangrene of foot HTN DM ASCVD Combined systolic and diastolic CHF/AICD CKD Gout Carotid artery disease ( proir LICA stent)   PSH Amputation rt 2nd toe AICD CABG Cholecystectomy Carotid endarterectomy   SH  Non smoker   Family History  Problem Relation Age of Onset  . Hypertension Other   . Diabetes Other   . Diabetes Father   . Hyperlipidemia Father   . Hypertension Father   . Hyperlipidemia Sister   . Hypertension Sister   . Diabetes Sister   . Diabetes Brother   . Hypertension Brother   . Hyperlipidemia Brother    Allergies  Allergen Reactions  . Ibuprofen Other (See  Comments)    Heart problems  . Baclofen Other (See Comments)  . Metformin Diarrhea  . Nsaids     Due to kidney and heart problems per pt  ? Current Facility-Administered Medications  Medication Dose Route Frequency Provider Last Rate Last Dose  . 0.9 %  sodium chloride infusion   Intravenous Continuous Gladstone Lighter, MD 60 mL/hr at 07/26/18 1229    . acetaminophen (TYLENOL) tablet 650 mg  650 mg Oral Q6H PRN Dustin Flock, MD   650 mg at 07/26/18 1400   Or  . acetaminophen (TYLENOL) suppository 650 mg  650 mg Rectal Q6H PRN Dustin Flock, MD      . allopurinol (ZYLOPRIM) tablet 50 mg  50 mg Oral Daily Dustin Flock, MD   50 mg at 07/26/18 1001  . aspirin EC tablet 81 mg  81 mg Oral Daily Dustin Flock, MD   81 mg at 07/26/18 1001  . carvedilol (COREG) tablet 3.125 mg  3.125 mg Oral BID Dustin Flock, MD   3.125 mg at 07/26/18 1001  . gabapentin (NEURONTIN) capsule 400 mg  400 mg Oral TID Dustin Flock, MD   400 mg at 07/26/18 1001  . insulin aspart (novoLOG) injection 0-9 Units  0-9 Units Subcutaneous TID WC Dustin Flock, MD   1 Units at 07/26/18 1229  . insulin glargine (LANTUS) injection 30 Units  30 Units Subcutaneous QHS Posey Pronto, [place],  MD   30 Units at 07/25/18 2103  . oxyCODONE (Oxy IR/ROXICODONE) immediate release tablet 10 mg  10 mg Oral Q6H PRN Dustin Flock, MD   10 mg at 07/26/18 1102  . piperacillin-tazobactam (ZOSYN) IVPB 3.375 g  3.375 g Intravenous Q8H Lu Duffel, RPH 12.5 mL/hr at 07/26/18 1404 3.375 g at 07/26/18 1404  . predniSONE (DELTASONE) tablet 5 mg  5 mg Oral Q breakfast Dustin Flock, MD   5 mg at 07/26/18 4401  . torsemide (DEMADEX) tablet 40 mg  40 mg Oral BID Dustin Flock, MD   40 mg at 07/26/18 0272  . [START ON 07/27/2018] vancomycin (VANCOCIN) 2,250 mg in sodium chloride 0.9 % 500 mL IVPB  2,250 mg Intravenous Q48H Shanlever, Pierce Crane, RPH         Abtx:  Anti-infectives (From admission, onward)   Start     Dose/Rate  Route Frequency Ordered Stop   07/27/18 1000  vancomycin (VANCOCIN) 2,250 mg in sodium chloride 0.9 % 500 mL IVPB     2,250 mg 250 mL/hr over 120 Minutes Intravenous Every 48 hours 07/25/18 1839     07/26/18 0000  piperacillin-tazobactam (ZOSYN) IVPB 3.375 g     3.375 g 12.5 mL/hr over 240 Minutes Intravenous Every 8 hours 07/25/18 1839     07/25/18 1730  piperacillin-tazobactam (ZOSYN) IVPB 3.375 g     3.375 g 100 mL/hr over 30 Minutes Intravenous  Once 07/25/18 1717 07/25/18 1906   07/25/18 1730  vancomycin (VANCOCIN) 1,000 mg in sodium chloride 0.9 % 250 mL IVPB  Status:  Discontinued     1,000 mg 250 mL/hr over 60 Minutes Intravenous  Once 07/25/18 1717 07/25/18 1720   07/25/18 1730  vancomycin (VANCOCIN) 2,000 mg in sodium chloride 0.9 % 500 mL IVPB     2,000 mg 250 mL/hr over 120 Minutes Intravenous  Once 07/25/18 1720 07/25/18 2139      REVIEW OF SYSTEMS:  Const: negative fever, negative chills, negative weight loss Eyes: negative diplopia or visual changes, negative eye pain ENT: negative coryza, negative sore throat Resp: negative cough, hemoptysis, dyspnea Cards: negative for chest pain, palpitations, lower extremity edema GU: negative for frequency, dysuria and hematuria GI: Negative for abdominal pain, diarrhea, bleeding, constipation Skin: negative for rash and pruritus Heme: negative for easy bruising and gum/nose bleeding MS: negative for myalgias, arthralgias, back pain and muscle weakness Neurolo:negative for headaches, dizziness, vertigo, memory problems  Psych: negative for feelings of anxiety, depression  Endocrine: negative for thyroid, diabetes Allergy/Immunology- negative for any medication or food allergies ? Objective:  VITALS:  BP (!) 106/59 (BP Location: Left Arm)   Pulse 86   Temp 99.7 F (37.6 C) (Oral)   Resp 19   Ht 5\' 10"  (1.778 m)   Wt 94.8 kg   SpO2 97%   BMI 29.99 kg/m  PHYSICAL EXAM:  General: Alert, cooperative, no distress, appears  stated age.  Head: Normocephalic, without obvious abnormality, atraumatic. Eyes: Conjunctivae clear, anicteric sclerae. Pupils are equal ENT Nares normal. No drainage or sinus tenderness. Lips, mucosa, and tongue normal. No Thrush Neck: Supple, symmetrical, no adenopathy, thyroid: non tender no carotid bruit and no JVD. Back: No CVA tenderness. Lungs: Clear to auscultation bilaterally. No Wheezing or Rhonchi. No rales. Heart: Regular rate and rhythm, no murmur, rub or gallop. ICD site okay Sternal scar Abdomen: Soft, non-tender,not distended. Bowel sounds normal. No masses Extremities:       Wasting of interosseous muscle hands B/l ulnar tophi  atraumatic, no cyanosis. No edema. No clubbing Skin: No rashes or lesions. Or bruising Lymph: Cervical, supraclavicular normal. Neurologic: Grossly non-focal Pertinent Labs Lab Results CBC    Component Value Date/Time   WBC 17.0 (H) 07/26/2018 0439   RBC 3.46 (L) 07/26/2018 0439   HGB 8.6 (L) 07/26/2018 0439   HCT 28.6 (L) 07/26/2018 0439   PLT 379 07/26/2018 0439   MCV 82.7 07/26/2018 0439   MCH 24.9 (L) 07/26/2018 0439   MCHC 30.1 07/26/2018 0439   RDW 18.5 (H) 07/26/2018 0439   LYMPHSABS 0.5 (L) 05/25/2018 0036   MONOABS 0.9 05/25/2018 0036   EOSABS 0.0 05/25/2018 0036   BASOSABS 0.0 05/25/2018 0036    CMP Latest Ref Rng & Units 07/26/2018 07/25/2018 05/29/2018  Glucose 70 - 99 mg/dL 175(H) 255(H) 246(H)  BUN 6 - 20 mg/dL 58(H) 62(H) 73(H)  Creatinine 0.61 - 1.24 mg/dL 2.53(H) 2.70(H) 2.67(H)  Sodium 135 - 145 mmol/L 135 135 135  Potassium 3.5 - 5.1 mmol/L 3.9 4.3 4.6  Chloride 98 - 111 mmol/L 98 97(L) 100  CO2 22 - 32 mmol/L 27 27 26   Calcium 8.9 - 10.3 mg/dL 8.5(L) 8.8(L) 8.5(L)  Total Protein 6.5 - 8.1 g/dL - - -  Total Bilirubin 0.3 - 1.2 mg/dL - - -  Alkaline Phos 38 - 126 U/L - - -  AST 15 - 41 U/L - - -  ALT 0 - 44 U/L - - -      Microbiology: Recent Results (from the past 240 hour(s))  Culture, blood  (routine x 2)     Status: None (Preliminary result)   Collection Time: 07/25/18  5:58 PM  Result Value Ref Range Status   Specimen Description BLOOD LEFT ANTECUBITAL  Final   Special Requests   Final    BOTTLES DRAWN AEROBIC AND ANAEROBIC Blood Culture adequate volume   Culture   Final    NO GROWTH < 24 HOURS Performed at [facility], [address] [place], [place] [address]    Report Status PENDING  Incomplete  Culture, blood (routine x 2)     Status: None (Preliminary result)   Collection Time: 07/25/18  6:11 PM  Result Value Ref Range Status   Specimen Description BLOOD BLOOD RIGHT FOREARM  Final   Special Requests   Final    BOTTLES DRAWN AEROBIC AND ANAEROBIC Blood Culture adequate volume   Culture   Final    NO GROWTH < 24 HOURS Performed at [facility], [address] [place], [place] [address]    Report Status PENDING  Incomplete    IMAGING RESULTS: No convincing evidence of osteomyelitis in the posterior calcaneus adjacent to the patient's ulcer  ? Impression/Recommendation ? 53 y.o. 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   DFI B/l infected  heel ulcers - since 6 months-  Recent culture proteus and MRSA on 07/20/18 Currently on vanco and zosyn- change the latter to unasyn  PAD- tomorrow he will have evaluation ? DM- management as per primary team?  CAD  ___________________________________________________ Discussed with patient, requesting provider

## 2018-07-26 NOTE — Progress Notes (Signed)
[facility] [place] NAME: Paul [name]    MR#:  [id number]  DATE OF BIRTH:  10/26/1965  SUBJECTIVE:  CHIEF COMPLAINT:  No chief complaint on file.  - bilateral heel ulcers- worse on the right heel, for angiogram tomorrow and will need debridement by podiatry - continue IV ABX  REVIEW OF SYSTEMS:  Review of Systems  Constitutional: Negative for chills and fever.  HENT: Negative for ear discharge, hearing loss and nosebleeds.   Eyes: Negative for blurred vision and double vision.  Respiratory: Negative for cough, shortness of breath and wheezing.   Cardiovascular: Positive for leg swelling. Negative for chest pain and palpitations.  Gastrointestinal: Negative for abdominal pain, constipation, diarrhea, nausea and vomiting.  Genitourinary: Negative for dysuria.  Musculoskeletal: Positive for myalgias.  Neurological: Negative for dizziness, focal weakness, seizures, weakness and headaches.  Psychiatric/Behavioral: Negative for depression.    DRUG ALLERGIES:   Allergies  Allergen Reactions   Ibuprofen Other (See Comments)    Heart problems   Baclofen Other (See Comments)   Metformin Diarrhea   Nsaids     Due to kidney and heart problems per pt    VITALS:  Blood pressure (!) 106/59, pulse 86, temperature 99.7 F (37.6 C), temperature source Oral, resp. rate 19, height 5\' 10"  (1.778 m), weight 94.8 kg, SpO2 97 %.  PHYSICAL EXAMINATION:  Physical Exam  GENERAL:  53 y.o.-year-old patient lying in the bed with no acute distress.  EYES: Pupils equal, round, reactive to light and accommodation. No scleral icterus. Extraocular muscles intact.  HEENT: Head atraumatic, normocephalic. Oropharynx and nasopharynx clear.  NECK:  Supple, no jugular venous distention. No thyroid enlargement, no tenderness.  LUNGS: Normal breath sounds bilaterally, no wheezing, rales,rhonchi or crepitation. No use of accessory muscles of respiration.  Decreased bibasilar breath sounds.  CARDIOVASCULAR: S1, S2 normal. No  rubs, or gallops. 2/6 systolic murmur in place. AICD in place  ABDOMEN: Soft, nontender, nondistended. Bowel sounds present. No organomegaly or mass.  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 Both feet heel ulcers noted as below - No  cyanosis, or clubbing.  Bilateral lower extremity edema NEUROLOGIC: Cranial nerves II through XII are intact. Muscle strength 5/5 in all extremities. Sensation intact. Gait not checked.  PSYCHIATRIC: The patient is alert and oriented x 3.  SKIN: No obvious rash, lesion, or ulcer.     RIGHT HEEL: see above     LEFT HEEL: see above  LABORATORY PANEL:   CBC Recent Labs  Lab 07/26/18 0439  WBC 17.0*  HGB 8.6*  HCT 28.6*  PLT 379   ------------------------------------------------------------------------------------------------------------------  Chemistries  Recent Labs  Lab 07/26/18 0439  NA 135  K 3.9  CL 98  CO2 27  GLUCOSE 175*  BUN 58*  CREATININE 2.53*  CALCIUM 8.5*   ------------------------------------------------------------------------------------------------------------------  Cardiac Enzymes No results for input(s): TROPONINI in the last 168 hours. ------------------------------------------------------------------------------------------------------------------  RADIOLOGY:  Ct Ankle Right Wo Contrast  Result Date: 07/25/2018 CLINICAL DATA:  Heel ulcer measuring 5.5 cm in length by 5.2 cm in width penetrating to the tendon level and concerning for osteomyelitis of the heel. EXAM: CT OF THE RIGHT ANKLE WITHOUT CONTRAST TECHNIQUE: Multidetector CT imaging of the right ankle was performed according to the standard protocol. Multiplanar CT image reconstructions were also generated. COMPARISON:  CT in the foot 05/25/2018 FINDINGS: Bones/Joint/Cartilage There has been subtle cortical bone loss involving the dorsal cortex of the calcaneus, series 6/images  47  through 51. This in conjunction with a new soft tissue ulcer overlying the dorsal aspect of the calcaneus is concerning for an evolving osteomyelitis. Tibiotalar, subtalar and included midfoot articulations are preserved. Ligaments Suboptimally assessed by CT. Muscles and Tendons Atrophy of the intrinsic musculature of the included foot. No intramuscular fluid collection. No tendon tear. Soft tissues Redemonstration of subcutaneous soft tissue edema of the included ankle and midfoot consistent with cellulitis and/or third spacing. Soft tissue ulceration extends down to the posterior calcaneal cortex since prior exam. IMPRESSION: 1. New soft tissue ulceration overlying the dorsum of the calcaneus extending down to the bone. 2. Subtle new cortical bone loss of the posterior aspect of the calcaneus, deep to the ulceration raises concern for evolving osteomyelitis. These results will be called to the ordering clinician or representative by the Radiologist Assistant, and communication documented in the PACS or zVision Dashboard. Electronically Signed   By: Ashley Royalty M.D.   On: 07/25/2018 17:16   Dg Foot Complete Right  Result Date: 07/25/2018 CLINICAL DATA:  Decubitus ulcers on the bilateral heels. Worsening on the right with improvement on the left. EXAM: RIGHT FOOT COMPLETE - 3+ VIEW COMPARISON:  None. FINDINGS: The patient is status post amputation of the distal foot. Vascular calcifications are noted. There is an ulcer posterior to the calcaneus seen on the lateral view. The adjacent calcaneus demonstrates no erosion. IMPRESSION: 1. No convincing evidence of osteomyelitis in the posterior calcaneus adjacent to the patient's ulcer. An MRI would be more sensitive if concern persists. Electronically Signed   By: Dorise Bullion III M.D   On: 07/25/2018 16:45    EKG:   Orders placed or performed during the hospital encounter of 05/24/18   ED EKG   ED EKG   EKG 12-Lead   EKG 12-Lead    ASSESSMENT  AND PLAN:   53 year old male with past medical history of stroke, carotid stenosis status post left carotid stent, CAD status post CABG,  ischemic cardiomyopathy status post defibrillator, A. fib on Eliquis, diabetes, nonhealing diabetic heel ulcers presents to hospital secondary to worsening right heel ulceration.  1.  Right heel nonhealing ulcer with osteomyelitis-WBC  still elevated. -Currently on IV Vanco and Zosyn. -Outpatient cultures growing MRSA and Proteus -ID consult requested -Need debridement of the right heel.  Appreciate podiatry input-plan for partial calcanectomy. -CT on admission of the right ankle and calcaneum showing mild new cortical bone loss changes concerning for evolving osteomyelitis. -Need angiogram prior to debridement.  Vascular has been consulted and plan for right lower extremity angiogram tomorrow  2.  Diabetes mellitus-continue Lantus, sliding scale insulin. -On gabapentin for diabetic neuropathy  3. CKD stage 4: cr at baseline. Monitor  4.  CAD-stable.  Status post CABG in 2010 and ICD placement.  Recently battery change done last year. -Continue aspirin, Coreg  5.  DVT prophylaxis-teds and SCDs.  Heparin on hold for possible procedures   Might need rehab at disposition  All the records are reviewed and case discussed with Care Management/Social Workerr. Management plans discussed with the patient, family and they are in agreement.  CODE STATUS: Full Code  TOTAL TIME TAKING CARE OF THIS PATIENT: 38 minutes.   POSSIBLE D/C IN 2 DAYS, DEPENDING ON CLINICAL CONDITION.   Gladstone Lighter M.D on 07/26/2018 at 12:14 PM  Between 7am to 6pm - Pager - [phone]  After 6pm go to [email or web address] - password [email or web address] [facility]  Office  [phone]  CC: Primary care physician;  Casilda Carls, MD

## 2018-07-27 ENCOUNTER — Encounter: Payer: Self-pay | Admitting: Vascular Surgery

## 2018-07-27 ENCOUNTER — Encounter
Admission: AD | Disposition: A | Discharge status: Home Health | Payer: Self-pay | Source: Home / Self Care | Attending: Internal Medicine

## 2018-07-27 DIAGNOSIS — M869 Osteomyelitis, unspecified: Secondary | ICD-10-CM

## 2018-07-27 DIAGNOSIS — L97516 Non-pressure chronic ulcer of other part of right foot with bone involvement without evidence of necrosis: Secondary | ICD-10-CM

## 2018-07-27 DIAGNOSIS — I70235 Atherosclerosis of native arteries of right leg with ulceration of other part of foot: Secondary | ICD-10-CM

## 2018-07-27 DIAGNOSIS — I7092 Chronic total occlusion of artery of the extremities: Secondary | ICD-10-CM

## 2018-07-27 HISTORY — PX: LOWER EXTREMITY ANGIOGRAPHY: CATH118251

## 2018-07-27 LAB — BASIC METABOLIC PANEL
Anion gap: 10 (ref 5–15)
BUN: 56 mg/dL — ABNORMAL HIGH (ref 6–20)
CHLORIDE: 98 mmol/L (ref 98–111)
CO2: 30 mmol/L (ref 22–32)
Calcium: 9.1 mg/dL (ref 8.9–10.3)
Creatinine, Ser: 2.39 mg/dL — ABNORMAL HIGH (ref 0.61–1.24)
GFR calc Af Amer: 35 mL/min — ABNORMAL LOW (ref >60)
GFR calc non Af Amer: 30 mL/min — ABNORMAL LOW (ref >60)
Glucose, Bld: 146 mg/dL — ABNORMAL HIGH (ref 70–99)
Potassium: 3.6 mmol/L (ref 3.5–5.1)
Sodium: 138 mmol/L (ref 135–145)

## 2018-07-27 LAB — GLUCOSE, CAPILLARY
Glucose-Capillary: 104 mg/dL — ABNORMAL HIGH (ref 70–99)
Glucose-Capillary: 67 mg/dL — ABNORMAL LOW (ref 70–99)
Glucose-Capillary: 72 mg/dL (ref 70–99)
Glucose-Capillary: 111 mg/dL — ABNORMAL HIGH (ref 70–99)
Glucose-Capillary: 169 mg/dL — ABNORMAL HIGH (ref 70–99)
Glucose-Capillary: 142 mg/dL — ABNORMAL HIGH (ref 70–99)

## 2018-07-27 LAB — MRSA PCR SCREENING: MRSA by PCR: POSITIVE — AB

## 2018-07-27 SURGERY — LOWER EXTREMITY ANGIOGRAPHY
Anesthesia: Moderate Sedation | Laterality: Right

## 2018-07-27 MED ORDER — DEXTROSE 50 % IV SOLN
INTRAVENOUS | Status: AC
  Administered 2018-07-27: 50 mL via INTRAVENOUS
  Filled 2018-07-27: qty 50

## 2018-07-27 MED ORDER — FENTANYL CITRATE (PF) 100 MCG/2ML IJ SOLN
INTRAMUSCULAR | Status: AC
  Administered 2018-07-28: 25 ug via INTRAVENOUS
  Filled 2018-07-27: qty 2

## 2018-07-27 MED ORDER — HEPARIN SODIUM (PORCINE) 1000 UNIT/ML IJ SOLN
INTRAMUSCULAR | Status: AC
  Filled 2018-07-27: qty 1

## 2018-07-27 MED ORDER — LIDOCAINE-EPINEPHRINE (PF) 1 %-1:200000 IJ SOLN
INTRAMUSCULAR | Status: AC
  Filled 2018-07-27: qty 30

## 2018-07-27 MED ORDER — FENTANYL CITRATE (PF) 100 MCG/2ML IJ SOLN
INTRAMUSCULAR | Status: DC | PRN
  Administered 2018-07-27: 50 ug via INTRAVENOUS
  Administered 2018-07-27: 25 ug via INTRAVENOUS

## 2018-07-27 MED ORDER — HEPARIN SODIUM (PORCINE) 1000 UNIT/ML IJ SOLN
INTRAMUSCULAR | Status: DC | PRN
  Administered 2018-07-27: 5000 [IU] via INTRAVENOUS

## 2018-07-27 MED ORDER — CLOPIDOGREL BISULFATE 75 MG PO TABS
75.0000 mg | ORAL_TABLET | Freq: Every day | ORAL | Status: DC
  Administered 2018-07-28 – 2018-08-02 (×6): 75 mg via ORAL
  Filled 2018-07-27 (×7): qty 1

## 2018-07-27 MED ORDER — ATORVASTATIN CALCIUM 10 MG PO TABS
10.0000 mg | ORAL_TABLET | Freq: Every day | ORAL | Status: DC
  Administered 2018-07-27 – 2018-08-01 (×6): 10 mg via ORAL
  Filled 2018-07-27 (×6): qty 1

## 2018-07-27 MED ORDER — MIDAZOLAM HCL 5 MG/5ML IJ SOLN
INTRAMUSCULAR | Status: AC
  Filled 2018-07-27: qty 5

## 2018-07-27 MED ORDER — MIDAZOLAM HCL 2 MG/2ML IJ SOLN
INTRAMUSCULAR | Status: DC | PRN
  Administered 2018-07-27: 1 mg via INTRAVENOUS
  Administered 2018-07-27: 2 mg via INTRAVENOUS

## 2018-07-27 MED ORDER — VANCOMYCIN HCL 10 G IV SOLR
1750.0000 mg | INTRAVENOUS | Status: DC
  Administered 2018-07-27: 1750 mg via INTRAVENOUS
  Filled 2018-07-27 (×2): qty 1750

## 2018-07-27 MED ORDER — IOHEXOL 300 MG/ML  SOLN
INTRAMUSCULAR | Status: DC | PRN
  Administered 2018-07-27: 70 mL via INTRAVENOUS

## 2018-07-27 MED ORDER — DEXTROSE 50 % IV SOLN
1.0000 | Freq: Once | INTRAVENOUS | Status: AC
  Administered 2018-07-27: 50 mL via INTRAVENOUS

## 2018-07-27 MED ORDER — MUPIROCIN 2 % EX OINT
TOPICAL_OINTMENT | Freq: Two times a day (BID) | CUTANEOUS | Status: DC
  Administered 2018-07-27 – 2018-07-29 (×4): via NASAL
  Administered 2018-07-29 – 2018-07-30 (×2): 1 via NASAL
  Administered 2018-07-30 – 2018-08-02 (×6): via NASAL
  Filled 2018-07-27: qty 22

## 2018-07-27 SURGICAL SUPPLY — 15 items
BALLN LUTONIX 5X150X130 (BALLOONS) ×3
BALLN ULTRVRSE 3X220X150 (BALLOONS) ×3
BALLOON LUTONIX 5X150X130 (BALLOONS) ×1 IMPLANT
BALLOON ULTRVRSE 3X220X150 (BALLOONS) ×1 IMPLANT
CATH BEACON 5 .038 100 VERT TP (CATHETERS) ×3 IMPLANT
CATH PIG 70CM (CATHETERS) ×3 IMPLANT
DEVICE PRESTO INFLATION (MISCELLANEOUS) ×3 IMPLANT
DEVICE STARCLOSE SE CLOSURE (Vascular Products) ×3 IMPLANT
GLIDEWIRE ADV .035X260CM (WIRE) ×3 IMPLANT
PACK ANGIOGRAPHY (CUSTOM PROCEDURE TRAY) ×3 IMPLANT
SHEATH ANL2 6FRX45 HC (SHEATH) ×3 IMPLANT
SHEATH BRITE TIP 5FRX11 (SHEATH) ×3 IMPLANT
TUBING CONTRAST HIGH PRESS 72 (TUBING) ×3 IMPLANT
WIRE G V18X300CM (WIRE) ×3 IMPLANT
WIRE J 3MM .035X145CM (WIRE) ×3 IMPLANT

## 2018-07-27 NOTE — Progress Notes (Signed)
Contact precautions initiated on 3/24 by ID MD per MRSA in wound, PCR results this shift positive. Only contact for MRSA in wound at this time per protocol

## 2018-07-27 NOTE — Progress Notes (Signed)
[facility] [place] NAME: Paul [name]    MR#:  [id number]  DATE OF BIRTH:  04/24/1966  SUBJECTIVE:  CHIEF COMPLAINT:  No chief complaint on file.  - bilateral heel ulcers- worse on the right heel, for angiogram this afternoon -Right heel wound debridement with podiatry tomorrow.- continue IV ABX -Denies any complaints  REVIEW OF SYSTEMS:  Review of Systems  Constitutional: Negative for chills and fever.  HENT: Negative for ear discharge, hearing loss and nosebleeds.   Eyes: Negative for blurred vision and double vision.  Respiratory: Negative for cough, shortness of breath and wheezing.   Cardiovascular: Positive for leg swelling. Negative for chest pain and palpitations.  Gastrointestinal: Negative for abdominal pain, constipation, diarrhea, nausea and vomiting.  Genitourinary: Negative for dysuria.  Musculoskeletal: Positive for myalgias.  Neurological: Negative for dizziness, focal weakness, seizures, weakness and headaches.  Psychiatric/Behavioral: Negative for depression.    DRUG ALLERGIES:   Allergies  Allergen Reactions   Ibuprofen Other (See Comments)    Heart problems   Baclofen Other (See Comments)   Metformin Diarrhea   Nsaids     Due to kidney and heart problems per pt    VITALS:  Blood pressure 109/60, pulse 81, temperature 99.7 F (37.6 C), temperature source Oral, resp. rate 15, height 5\' 10"  (1.778 m), weight 94.8 kg, SpO2 94 %.  PHYSICAL EXAMINATION:  Physical Exam  GENERAL:  53 y.o.-year-old patient lying in the bed with no acute distress.  EYES: Pupils equal, round, reactive to light and accommodation. No scleral icterus. Extraocular muscles intact.  HEENT: Head atraumatic, normocephalic. Oropharynx and nasopharynx clear.  NECK:  Supple, no jugular venous distention. No thyroid enlargement, no tenderness.  LUNGS: Normal breath sounds bilaterally, no wheezing, rales,rhonchi or crepitation. No use of  accessory muscles of respiration. Decreased bibasilar breath sounds.  CARDIOVASCULAR: S1, S2 normal. No  rubs, or gallops. 2/6 systolic murmur in place. AICD in place  ABDOMEN: Soft, nontender, nondistended. Bowel sounds present. No organomegaly or mass.  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 Both feet heel ulcers noted as below - No  cyanosis, or clubbing.  Bilateral lower extremity edema NEUROLOGIC: Cranial nerves II through XII are intact. Muscle strength 5/5 in all extremities. Sensation intact. Gait not checked.  PSYCHIATRIC: The patient is alert and oriented x 3.  SKIN: No obvious rash, lesion, or ulcer.     RIGHT HEEL: see above     LEFT HEEL: see above  LABORATORY PANEL:   CBC Recent Labs  Lab 07/26/18 0439  WBC 17.0*  HGB 8.6*  HCT 28.6*  PLT 379   ------------------------------------------------------------------------------------------------------------------  Chemistries  Recent Labs  Lab 07/27/18 0316  NA 138  K 3.6  CL 98  CO2 30  GLUCOSE 146*  BUN 56*  CREATININE 2.39*  CALCIUM 9.1   ------------------------------------------------------------------------------------------------------------------  Cardiac Enzymes No results for input(s): TROPONINI in the last 168 hours. ------------------------------------------------------------------------------------------------------------------  RADIOLOGY:  Ct Ankle Right Wo Contrast  Result Date: 07/25/2018 CLINICAL DATA:  Heel ulcer measuring 5.5 cm in length by 5.2 cm in width penetrating to the tendon level and concerning for osteomyelitis of the heel. EXAM: CT OF THE RIGHT ANKLE WITHOUT CONTRAST TECHNIQUE: Multidetector CT imaging of the right ankle was performed according to the standard protocol. Multiplanar CT image reconstructions were also generated. COMPARISON:  CT in the foot 05/25/2018 FINDINGS: Bones/Joint/Cartilage There has been subtle cortical bone loss involving the dorsal cortex  of the calcaneus,  series 6/images 47 through 51. This in conjunction with a new soft tissue ulcer overlying the dorsal aspect of the calcaneus is concerning for an evolving osteomyelitis. Tibiotalar, subtalar and included midfoot articulations are preserved. Ligaments Suboptimally assessed by CT. Muscles and Tendons Atrophy of the intrinsic musculature of the included foot. No intramuscular fluid collection. No tendon tear. Soft tissues Redemonstration of subcutaneous soft tissue edema of the included ankle and midfoot consistent with cellulitis and/or third spacing. Soft tissue ulceration extends down to the posterior calcaneal cortex since prior exam. IMPRESSION: 1. New soft tissue ulceration overlying the dorsum of the calcaneus extending down to the bone. 2. Subtle new cortical bone loss of the posterior aspect of the calcaneus, deep to the ulceration raises concern for evolving osteomyelitis. These results will be called to the ordering clinician or representative by the Radiologist Assistant, and communication documented in the PACS or zVision Dashboard. Electronically Signed   By: Ashley Royalty M.D.   On: 07/25/2018 17:16   Dg Foot Complete Right  Result Date: 07/25/2018 CLINICAL DATA:  Decubitus ulcers on the bilateral heels. Worsening on the right with improvement on the left. EXAM: RIGHT FOOT COMPLETE - 3+ VIEW COMPARISON:  None. FINDINGS: The patient is status post amputation of the distal foot. Vascular calcifications are noted. There is an ulcer posterior to the calcaneus seen on the lateral view. The adjacent calcaneus demonstrates no erosion. IMPRESSION: 1. No convincing evidence of osteomyelitis in the posterior calcaneus adjacent to the patient's ulcer. An MRI would be more sensitive if concern persists. Electronically Signed   By: Dorise Bullion III M.D   On: 07/25/2018 16:45    EKG:   Orders placed or performed during the hospital encounter of 05/24/18   ED EKG   ED EKG   EKG 12-Lead    EKG 12-Lead    ASSESSMENT AND PLAN:   53 year old male with past medical history of stroke, carotid stenosis status post left carotid stent, CAD status post CABG,  ischemic cardiomyopathy status post defibrillator, A. fib on Eliquis, diabetes, nonhealing diabetic heel ulcers presents to hospital secondary to worsening right heel ulceration.  1.  Right heel nonhealing ulcer with osteomyelitis-WBC   elevated. - on IV Vanco and Unasyn as per ID recommendations. -Outpatient cultures growing MRSA and Proteus -ID and podiatry consult appreciated -Need debridement of the right heel.  Appreciate podiatry input-plan for partial calcanectomy. -CT on admission of the right ankle and calcaneum showing mild new cortical bone loss changes concerning for evolving osteomyelitis. -Need angiogram prior to debridement.  Vascular has been consulted and plan for right lower extremity angiogram today  2.  Diabetes mellitus-continue Lantus, sliding scale insulin. -On gabapentin for diabetic neuropathy  3. CKD stage 4: cr at baseline. Monitor  4.  CAD-stable.  Status post CABG in 2010 and ICD placement.  Recently battery change done last year. -Continue aspirin, Coreg  5.  DVT prophylaxis-teds and SCDs.  Heparin on hold for possible procedures   PT consult after debridement by podiatry   All the records are reviewed and case discussed with Care Management/Social Workerr. Management plans discussed with the patient, family and they are in agreement.  CODE STATUS: Full Code  TOTAL TIME TAKING CARE OF THIS PATIENT: 31 minutes.   POSSIBLE D/C IN 3-4 DAYS, DEPENDING ON CLINICAL CONDITION.   Gladstone Lighter M.D on 07/27/2018 at 11:06 AM  Between 7am to 6pm - Pager - [phone]  After 6pm go to [email or web address] - [place]  [facility]  Office  [phone]  CC: Primary care physician; Casilda Carls, MD

## 2018-07-27 NOTE — H&P (Signed)
[facility] History & Physical Update  The patient was interviewed and re-examined.  The patient's previous History and Physical has been reviewed and is unchanged.  There is no change in the plan of care. We plan to proceed with the scheduled procedure.  Leotis Pain, MD  07/27/2018, 12:05 PM

## 2018-07-27 NOTE — Progress Notes (Signed)
Inpatient Diabetes Program Recommendations  AACE/ADA: New Consensus Statement on Inpatient Glycemic Control   Target Ranges:  Prepandial:   less than 140 mg/dL      Peak postprandial:   less than 180 mg/dL (1-2 hours)      Critically ill patients:  140 - 180 mg/dL   Results for DONNOVAN, STAMOUR (MRN [id number]) as of 07/27/2018 12:34  Ref. Range 07/26/2018 07:27 07/26/2018 11:33 07/26/2018 16:53 07/26/2018 21:07 07/27/2018 07:52 07/27/2018 11:39 07/27/2018 12:15  Glucose-Capillary Latest Ref Range: 70 - 99 mg/dL 130 (H) 146 (H) 148 (H) 173 (H) 104 (H) 72 67 (L)   Review of Glycemic Control  Diabetes history: DM2 Outpatient Diabetes medications: Lantus 30 units QHS Current orders for Inpatient glycemic control: Lantus 30 units QHS, Novolog 0-9 units TID with meals; Prednisone 5 mg QAM  Inpatient Diabetes Program Recommendations:  Insulin - Basal: Please consider decreasing Lantus to 28 units QHS.  Thanks, Barnie Alderman, RN, MSN, CDE Diabetes Coordinator Inpatient Diabetes Program [phone] (Team Pager from 8am to 5pm)

## 2018-07-27 NOTE — Op Note (Signed)
[facility]  Percutaneous Study/Intervention Procedural Note   Date of Surgery: 07/27/2018  Surgeon(s):,    Assistants:none  Pre-operative Diagnosis: PAD with ulceration and infection RLE  Post-operative diagnosis:  Same  Procedure(s) Performed:             1.  Ultrasound guidance for vascular access left femoral artery             2.  Catheter placement into right common femoral artery from left femoral approach             3.  Aortogram and selective right lower extremity angiogram             4.  Percutaneous transluminal angioplasty of right distal SFA and above knee popliteal artery with a 5 mm diameter by 15 cm length Lutonix drug-coated angioplasty balloon             5.   Percutaneous transluminal angioplasty of the right tibioperoneal trunk and peroneal artery with 3 mm diameter by 22 cm length angioplasty balloon  6.   StarClose closure device left femoral artery  EBL: 5 cc  Contrast: 70 cc  Fluoro Time: 6.6 minutes  Moderate Conscious Sedation Time: approximately 30 minutes using 3 mg of Versed and 75 Mcg of Fentanyl              Indications:  Patient is a 53 y.o.male with ulceration and osteomyelitis of the right foot.  He is admitted to our hospital where noninvasive studies are completely unreliable so we have not gotten any noninvasive studies. The patient is brought in for angiography for further evaluation and potential treatment.  Due to the limb threatening nature of the situation, angiogram was performed for attempted limb salvage. The patient is aware that if the procedure fails, amputation would be expected.  The patient also understands that even with successful revascularization, amputation may still be required due to the severity of the situation. Risks and benefits are discussed and informed consent is obtained.   Procedure:  The patient was identified and appropriate procedural time out was performed.  The patient was then  placed supine on the table and prepped and draped in the usual sterile fashion. Moderate conscious sedation was administered during a face to face encounter with the patient throughout the procedure with my supervision of the RN administering medicines and monitoring the patient's vital signs, pulse oximetry, telemetry and mental status throughout from the start of the procedure until the patient was taken to the recovery room. Ultrasound was used to evaluate the left common femoral artery.  It was patent .  A digital ultrasound image was acquired.  A Seldinger needle was used to access the left common femoral artery under direct ultrasound guidance and a permanent image was performed.  A 0.035 J wire was advanced without resistance and a 5Fr sheath was placed.  Pigtail catheter was placed into the aorta and an AP aortogram was performed. This demonstrated normal renal arteries and normal aorta and iliac segments without significant stenosis. I then crossed the aortic bifurcation and advanced to the right femoral head. Selective right lower extremity angiogram was then performed. This demonstrated calcific but not stenotic common femoral artery, profunda femoris artery, and proximal superficial femoral artery.  The SFA remained patent until the distal SFA just above Hunter's canal where there was a 90 to 95% stenosis.  The vessel normalized for several centimeters then the above-knee popliteal artery had about a 70% stenosis.  The below-knee popliteal artery normalized some in the anterior tibial artery was the dominant runoff to the foot.  The origin of the tibioperoneal trunk had about a 70% stenosis in the proximal peroneal artery had about an 80% stenosis.  The distal peroneal artery occluded after giving off multiple collaterals.  The posterior tibial artery was chronically occluded without distal reconstitution seen. It was felt that it was in the patient's best interest to proceed with intervention after  these images to avoid a second procedure and a larger amount of contrast and fluoroscopy based off of the findings from the initial angiogram. The patient was systemically heparinized and a 6 [place] Ansell sheath was then placed over the [employer] wire. I then used a Kumpe catheter and the advantage wire to navigate through the SFA and popliteal lesions and confirm intraluminal flow in the below-knee popliteal artery.  I then exchanged for a 0.018 wire and cross the lesions in the tibioperoneal trunk and proximal peroneal artery parking the wire in the distal calf peroneal artery.  We then proceeded with treatment.  A 5 mm diameter by 15 cm length Lutonix drug-coated angioplasty balloon was inflated from just above the knee up to the distal SFA.  This was taken to 12 atm for 1 minute.  The tibioperoneal trunk and proximal to mid peroneal arteries were then treated with a 3 mm diameter by 22 cm length angioplasty balloon inflated to 12 atm for 1 minute.  Completion imaging showed about a 20 to 30% residual stenosis in the lesion just above Hunter's canal in the distal SFA, about a 10% residual stenosis in the above-knee popliteal artery, less than 10% residual stenosis in the tibioperoneal trunk and proximal peroneal arteries with good collaterals before the peroneal artery occluded distally.  The anterior tibial artery remained large and continuous to the foot. I elected to terminate the procedure. The sheath was removed and StarClose closure device was deployed in the left femoral artery with excellent hemostatic result. The patient was taken to the recovery room in stable condition having tolerated the procedure well.  Findings:               Aortogram:  normal renal arteries, normal aorta and iliac arteries without significant stenosis.             Right Lower Extremity:  calcific but not stenotic common femoral artery, profunda femoris artery, and proximal superficial femoral artery.  The SFA remained  patent until the distal SFA just above Hunter's canal where there was a 90 to 95% stenosis.  The vessel normalized for several centimeters then the above-knee popliteal artery had about a 70% stenosis.  The below-knee popliteal artery normalized some in the anterior tibial artery was the dominant runoff to the foot.  The origin of the tibioperoneal trunk had about a 70% stenosis in the proximal peroneal artery had about an 80% stenosis.  The distal peroneal artery occluded after giving off multiple collaterals.  The posterior tibial artery was chronically occluded without distal reconstitution seen.   Disposition: Patient was taken to the recovery room in stable condition having tolerated the procedure well.  Complications: None  Leotis Pain 07/27/2018 1:30 PM   This note was created with Dragon Medical transcription system. Any errors in dictation are purely unintentional.

## 2018-07-27 NOTE — Anesthesia Preprocedure Evaluation (Deleted)
Anesthesia Evaluation    Airway        Dental   Pulmonary           Cardiovascular hypertension,      Neuro/Psych    GI/Hepatic   Endo/Other  diabetes  Renal/GU      Musculoskeletal   Abdominal   Peds  Hematology   Anesthesia Other Findings Past Medical History: No date: Anemia No date: Cardiac defibrillator in place     Comment:  a. Biotronik LUmax 540 DRT, (ser # [id number]). No date: Carotid arterial disease ([place])     Comment:  a. s/p prior LICA stenting;  b. 05/6943 Carotid U/S:               40-59% bilat ICA stenosis. Patent LICA stent. No date: CHF (congestive heart failure) (H[facility]) No date: CKD (chronic kidney disease), stage III ([place]) No date: Coronary artery disease     Comment:  a. 2010 s/p CABG x 3. No date: DDD (degenerative disc disease), lumbosacral     Comment:  L5-S1 No date: Diabetes [facility])     Comment:  Lantus at bedtime No date: Gangrene of toe of right foot ([place]) 10/06/2016: Gout of left hand No date: HFrEF (heart failure with reduced ejection fraction) ([place])     Comment:  a. 07/2016 Echo: EF 25-30%, diff HK, mild MR, mildly dil               LA, mod reduced RV fxn, PASP 35mmHg. No date: Hypertension     Comment:  takes Coreg daily No date: Ischemic cardiomyopathy     Comment:  a. 07/2016 Echo: EF 25-30%, diff HK. 2009: Myocardial infarction ([place]) No date: Peripheral vascular disease ([place]) No date: Sleep apnea     Comment:  sleep study yr ago-unable to afford cpap 09: Stroke [facility])     Comment:  no weakness   Reproductive/Obstetrics                             Anesthesia Physical Anesthesia Plan Anesthesia Quick Evaluation

## 2018-07-27 NOTE — Consult Note (Signed)
Pharmacy Antibiotic Note  Paul [name]. is a 53 y.o. male admitted on 07/25/2018 with cellulitis.  Pharmacy has been consulted for Vancomycin dosing. The ID physician has discontinued Zosyn and has started the patient on Unasyn.   Plan: 1) Vancomycin 1750 mg IV Q 36 hrs. Goal AUC 400-550. Expected AUC: 487.9 Calculated Css(min): 10.9 SCr used: 2.39  Patient's BMI is 29.9, therefore used Vd coefficient of 0.72 for dosing purposes.    2) Unasyn 1.5 g IV Q 6 hours as dosed by Dr. Donavan Burnet (24hrs), Avg:99.2 F (37.3 C), Min:98.7 F (37.1 C), Max:99.7 F (37.6 C)  Recent Labs  Lab 07/25/18 1750 07/26/18 0439 07/27/18 0316  WBC 21.4* 17.0*  --   CREATININE 2.70* 2.53* 2.39*    Estimated Creatinine Clearance: 41.3 mL/min (A) (by C-G formula based on SCr of 2.39 mg/dL (H)).    Allergies  Allergen Reactions  . Ibuprofen Other (See Comments)    Heart problems  . Baclofen Other (See Comments)  . Metformin Diarrhea  . Nsaids     Due to kidney and heart problems per pt    Antimicrobials this admission: Zosyn 3/23 x 1 dose Vancomycin 3/23 >>  Unasyn 3/24 >>  Dose adjustments this admission: None  Microbiology results: 3/23 BCx: NGTD   Thank you for allowing pharmacy to be a part of this patient's care.  Pearla Dubonnet, PharmD Clinical Pharmacist 07/27/2018 7:22 AM

## 2018-07-27 NOTE — Care Management (Signed)
Called Amy Azzie Roup in financial services at [phone] left a VM letting her know that the patient wants assistance filling out Medicaid paperwork and requested a call back, gave my contact information.

## 2018-07-27 NOTE — Progress Notes (Signed)
[facility] Podiatry                                                      Patient Demographics  Paul [name], is a 53 y.o. male   MRN: [id number]   DOB - 12/27/1965  Admit Date - 07/25/2018    Outpatient Primary MD for the patient is Casilda Carls, MD  Consult requested in the Hospital by Gladstone Lighter, MD, On 07/27/2018      With History of -  Past Medical History:  Diagnosis Date  . Anemia   . Cardiac defibrillator in place    a. Biotronik LUmax 540 DRT, (ser # [id number]).  . Carotid arterial disease ([place])    a. s/p prior LICA stenting;  b. 07/4191 Carotid U/S: 40-59% bilat ICA stenosis. Patent LICA stent.  . CHF (congestive heart failure) ([place])   . CKD (chronic kidney disease), stage III ([place])   . Coronary artery disease    a. 2010 s/p CABG x 3.  . DDD (degenerative disc disease), lumbosacral    L5-S1  . Diabetes ([place])    Lantus at bedtime  . Gangrene of toe of right foot ([place])   . Gout of left hand 10/06/2016  . HFrEF (heart failure with reduced ejection fraction) ([place])    a. 07/2016 Echo: EF 25-30%, diff HK, mild MR, mildly dil LA, mod reduced RV fxn, PASP 79mmHg.  Marland Kitchen Hypertension    takes Coreg daily  . Ischemic cardiomyopathy    a. 07/2016 Echo: EF 25-30%, diff HK.  . Myocardial infarction ([place]) 2009  . Peripheral vascular disease ([place])   . Sleep apnea    sleep study yr ago-unable to afford cpap  . Stroke [facility]) 09   no weakness      Past Surgical History:  Procedure Laterality Date  . AMPUTATION TOE Right 08/22/2016   Procedure: AMPUTATION TOE;  Surgeon: Samara Deist, DPM;  Location: [facility] ORS;  Service: Podiatry;  Laterality: Right;  . AMPUTATION TOE Right 10/09/2016   Procedure: AMPUTATION TOE-RIGHT 2ND MPJ;  Surgeon: Samara Deist, DPM;  Location: [facility] ORS;  Service: Podiatry;  Laterality: Right;  . APLIGRAFT PLACEMENT Right 10/09/2016    Procedure: APLIGRAFT PLACEMENT;  Surgeon: Samara Deist, DPM;  Location: [facility] ORS;  Service: Podiatry;  Laterality: Right;  . CARDIAC DEFIBRILLATOR PLACEMENT  2011  . CAROTID ENDARTERECTOMY Left   . CHOLECYSTECTOMY  2010  . CORONARY ARTERY BYPASS GRAFT  2010   CABG x 3   . GRAFT APPLICATION Right 7/90/2409   Procedure: FULL THICKNESS SKIN GRAFT-RIGHT FOOT;  Surgeon: Algernon Huxley, MD;  Location: [facility] ORS;  Service: Vascular;  Laterality: Right;  . HIP SURGERY Right 1994  . INCISION AND DRAINAGE OF WOUND Right 08/22/2016   Procedure: IRRIGATION AND DEBRIDEMENT WOUND and wound vac placement;  Surgeon: Samara Deist, DPM;  Location: [facility] ORS;  Service: Podiatry;  Laterality: Right;  . LOWER EXTREMITY ANGIOGRAPHY Right 08/24/2016   Procedure: Lower Extremity Angiography;  Surgeon: Algernon Huxley, MD;  Location: [place] CV LAB;  Service: Cardiovascular;  Laterality: Right;  . LOWER EXTREMITY ANGIOGRAPHY Right 07/27/2018   Procedure: RIGHT Lower Extremity Angiography;  Surgeon: Algernon Huxley, MD;  Location: [place] [facility];  Service: Cardiovascular;  Laterality: Right;  . MASS EXCISION Right 07/18/2014   Procedure: EXCISION HETEROTOPIC BONE RIGHT  HIP;  Surgeon: Frederik Pear, MD;  Location: [place];  Service: Orthopedics;  Laterality: Right;  . Open Heart Surgery  2010   x 3  . WOUND DEBRIDEMENT Right 10/09/2016   Procedure: DEBRIDEMENT WOUND;  Surgeon: Samara Deist, DPM;  Location: [facility] ORS;  Service: Podiatry;  Laterality: Right;    in for   No chief complaint on file.    HPI  Paul [name]  is a 53 y.o. male, chronic problems with bilateral heel ulcers right worse than left.  Underwent angioplasty today and hopefully improved his blood flow to the right lower extremity.  Scheduled for surgery tomorrow to debride both heels.    Review of Systems he is alert pleasant and well-oriented  In addition to the HPI above,  No Fever-chills, No Headache, No changes with Vision or hearing, No  problems swallowing food or Liquids, No Chest pain, Cough or Shortness of Breath, No Abdominal pain, No Nausea or Vommitting, Bowel movements are regular, No Blood in stool or Urine, No dysuria, No new skin rashes or bruises, No new joints pains-aches,  No new weakness, tingling, numbness in any extremity, No recent weight gain or loss, No polyuria, polydypsia or polyphagia, No significant Mental Stressors.  A full 10 point Review of Systems was done, except as stated above, all other Review of Systems were negative.   Social History Social History   Tobacco Use  . Smoking status: Never Smoker  . Smokeless tobacco: Never Used  Substance Use Topics  . Alcohol use: No    Family History Family History  Problem Relation Age of Onset  . Hypertension Other   . Diabetes Other   . Diabetes Father   . Hyperlipidemia Father   . Hypertension Father   . Hyperlipidemia Sister   . Hypertension Sister   . Diabetes Sister   . Diabetes Brother   . Hypertension Brother   . Hyperlipidemia Brother     Prior to Admission medications   Medication Sig Start Date End Date Taking? Authorizing Provider  allopurinol (ZYLOPRIM) 100 MG tablet Take 0.5 tablets (50 mg total) by mouth daily. Patient taking differently: Take 100 mg by mouth daily.  05/30/18  Yes Wieting, [name], MD  aspirin EC 81 MG tablet Take 1 tablet (81 mg total) by mouth daily. 10/21/16  Yes Theora Gianotti, NP  Cholecalciferol (VITAMIN D3) 1.25 MG (50000 UT) CAPS Take 1 capsule by mouth once a week. 07/08/18  Yes [provider]  gabapentin (NEURONTIN) 100 MG capsule Take 200 mg by mouth 3 (three) times daily.  03/03/18  Yes [provider]  acetaminophen (TYLENOL) 325 MG tablet Take 2 tablets (650 mg total) by mouth every 6 (six) hours as needed for mild pain (or Fever >/= 101). 05/29/18   Loletha Grayer, MD  apixaban (ELIQUIS) 5 MG TABS tablet Take 1 tablet (5 mg total) by mouth 2 (two) times  daily. Patient not taking: Reported on 07/25/2018 05/29/18   Loletha Grayer, MD  carvedilol (COREG) 3.125 MG tablet Take 1 tablet (3.125 mg total) by mouth 2 (two) times daily. Patient not taking: Reported on 07/27/2018 05/29/18   Loletha Grayer, MD  insulin glargine (LANTUS) 100 unit/mL SOPN Inject 0.2 mLs (20 Units total) into the skin at bedtime. Patient not taking: Reported on 07/27/2018 05/29/18   Loletha Grayer, MD  oxyCODONE 10 MG TABS Take 1 tablet (10 mg total) by mouth every 6 (six) hours as needed for moderate pain. Patient not taking: Reported on 07/27/2018 05/29/18  Wieting, [name], MD  potassium chloride SA (K-DUR,KLOR-CON) 20 MEQ tablet Take 1 tablet (20 mEq total) by mouth daily. Patient not taking: Reported on 07/25/2018 05/30/18   Loletha Grayer, MD  predniSONE (DELTASONE) 5 MG tablet 3 tabs po daily for three days then 2 tabs po daily for three days then one tab po daily afterwards Patient not taking: Reported on 07/27/2018 05/29/18   Loletha Grayer, MD  torsemide (DEMADEX) 20 MG tablet Take 2 tablets (40 mg total) by mouth 2 (two) times daily. Patient not taking: Reported on 07/27/2018 05/29/18   Loletha Grayer, MD    Anti-infectives (From admission, onward)   Start     Dose/Rate Route Frequency Ordered Stop   07/27/18 1000  vancomycin (VANCOCIN) 2,250 mg in sodium chloride 0.9 % 500 mL IVPB  Status:  Discontinued     2,250 mg 250 mL/hr over 120 Minutes Intravenous Every 48 hours 07/25/18 1839 07/27/18 0720   07/27/18 0900  vancomycin (VANCOCIN) 1,750 mg in sodium chloride 0.9 % 500 mL IVPB     1,750 mg 250 mL/hr over 120 Minutes Intravenous Every 36 hours 07/27/18 0720     07/26/18 1845  ampicillin-sulbactam (UNASYN) 1.5 g in sodium chloride 0.9 % 100 mL IVPB     1.5 g 200 mL/hr over 30 Minutes Intravenous Every 6 hours 07/26/18 1845     07/26/18 0000  piperacillin-tazobactam (ZOSYN) IVPB 3.375 g  Status:  Discontinued     3.375 g 12.5 mL/hr over 240 Minutes  Intravenous Every 8 hours 07/25/18 1839 07/26/18 1845   07/25/18 1730  piperacillin-tazobactam (ZOSYN) IVPB 3.375 g     3.375 g 100 mL/hr over 30 Minutes Intravenous  Once 07/25/18 1717 07/25/18 1906   07/25/18 1730  vancomycin (VANCOCIN) 1,000 mg in sodium chloride 0.9 % 250 mL IVPB  Status:  Discontinued     1,000 mg 250 mL/hr over 60 Minutes Intravenous  Once 07/25/18 1717 07/25/18 1720   07/25/18 1730  vancomycin (VANCOCIN) 2,000 mg in sodium chloride 0.9 % 500 mL IVPB     2,000 mg 250 mL/hr over 120 Minutes Intravenous  Once 07/25/18 1720 07/25/18 2139      Scheduled Meds: . allopurinol  50 mg Oral Daily  . aspirin EC  81 mg Oral Daily  . atorvastatin  10 mg Oral q1800  . carvedilol  3.125 mg Oral BID  . clopidogrel  75 mg Oral Daily  . fentaNYL      . gabapentin  400 mg Oral TID  . heparin      . insulin aspart  0-9 Units Subcutaneous TID WC  . insulin glargine  30 Units Subcutaneous QHS  . lidocaine-EPINEPHrine      . midazolam      . predniSONE  5 mg Oral Q breakfast  . torsemide  40 mg Oral BID   Continuous Infusions: . sodium chloride 61 mL/hr at 07/27/18 0500  . ampicillin-sulbactam (UNASYN) IV 1.5 g (07/27/18 0830)  . vancomycin 1,750 mg (07/27/18 0913)   PRN Meds:.acetaminophen **OR** acetaminophen, oxyCODONE  Allergies  Allergen Reactions  . Ibuprofen Other (See Comments)    Heart problems  . Baclofen Other (See Comments)  . Metformin Diarrhea  . Nsaids     Due to kidney and heart problems per pt    Physical Exam  Vitals  Blood pressure (!) 148/79, pulse 87, temperature 99.1 F (37.3 C), temperature source Oral, resp. rate (!) 21, height 5\' 10"  (1.778 m), weight 94.8 kg, SpO2 100 %.  Lower Extremity exam: Bilateral diabetic/decubitus ulcers with right foot infection and possible osteomyelitis in the right calcaneus.  Left ulceration is more superficial but still has some necrotic tissue.  Data Review  CBC Recent Labs  Lab 07/25/18 1750  07/26/18 0439  WBC 21.4* 17.0*  HGB 10.2* 8.6*  HCT 33.7* 28.6*  PLT 429* 379  MCV 82.4 82.7  MCH 24.9* 24.9*  MCHC 30.3 30.1  RDW 18.4* 18.5*   ------------------------------------------------------------------------------------------------------------------  Chemistries  Recent Labs  Lab 07/25/18 1750 07/26/18 0439 07/27/18 0316  NA 135 135 138  K 4.3 3.9 3.6  CL 97* 98 98  CO2 27 27 30   GLUCOSE 255* 175* 146*  BUN 62* 58* 56*  CREATININE 2.70* 2.53* 2.39*  CALCIUM 8.8* 8.5* 9.1   ------------------------------------------------------------------------------------------ Urinalysis    Component Value Date/Time   COLORURINE YELLOW (A) 05/25/2018 0049   APPEARANCEUR CLEAR (A) 05/25/2018 0049   LABSPEC 1.013 05/25/2018 0049   PHURINE 5.0 05/25/2018 0049   GLUCOSEU 50 (A) 05/25/2018 0049   HGBUR NEGATIVE 05/25/2018 0049   BILIRUBINUR NEGATIVE 05/25/2018 [place] 05/25/2018 0049   PROTEINUR NEGATIVE 05/25/2018 0049   UROBILINOGEN 1.0 07/16/2014 1558   NITRITE NEGATIVE 05/25/2018 0049   LEUKOCYTESUR NEGATIVE 05/25/2018 0049     Imaging results:   Ct Ankle Right Wo Contrast  Result Date: 07/25/2018 CLINICAL DATA:  Heel ulcer measuring 5.5 cm in length by 5.2 cm in width penetrating to the tendon level and concerning for osteomyelitis of the heel. EXAM: CT OF THE RIGHT ANKLE WITHOUT CONTRAST TECHNIQUE: Multidetector CT imaging of the right ankle was performed according to the standard protocol. Multiplanar CT image reconstructions were also generated. COMPARISON:  CT in the foot 05/25/2018 FINDINGS: Bones/Joint/Cartilage There has been subtle cortical bone loss involving the dorsal cortex of the calcaneus, series 6/images 47 through 51. This in conjunction with a new soft tissue ulcer overlying the dorsal aspect of the calcaneus is concerning for an evolving osteomyelitis. Tibiotalar, subtalar and included midfoot articulations are preserved. Ligaments  Suboptimally assessed by CT. Muscles and Tendons Atrophy of the intrinsic musculature of the included foot. No intramuscular fluid collection. No tendon tear. Soft tissues Redemonstration of subcutaneous soft tissue edema of the included ankle and midfoot consistent with cellulitis and/or third spacing. Soft tissue ulceration extends down to the posterior calcaneal cortex since prior exam. IMPRESSION: 1. New soft tissue ulceration overlying the dorsum of the calcaneus extending down to the bone. 2. Subtle new cortical bone loss of the posterior aspect of the calcaneus, deep to the ulceration raises concern for evolving osteomyelitis. These results will be called to the ordering clinician or representative by the Radiologist Assistant, and communication documented in the PACS or zVision Dashboard. Electronically Signed   By: Ashley Royalty M.D.   On: 07/25/2018 17:16    Assessment & Plan: An angioplasty today which hopefully improved his blood flow to the right lower extremity.  I will debride right and left ulcers tomorrow in the OR.  Right when I will need to probably clean off the posterior calcaneus at the point of exposure and get a culture on this area.  I have to determine amount of calcaneus removed at the time of surgery.  Soft tissue will be removed from both.  There is exposed tendon in the right heel ulcer as well.  Active Problems:   Acute osteomyelitis of right foot ([place])   Osteomyelitis ([place])   Family Communication: Plan discussed with patient   Rodman Key  Samentha Perham M.D on 07/27/2018 at 4:39 PM  Thank you for the consult, we will follow the patient with you in the Hospital.

## 2018-07-28 ENCOUNTER — Inpatient Hospital Stay: Payer: Medicare Other | Admitting: Anesthesiology

## 2018-07-28 ENCOUNTER — Encounter: Payer: Self-pay | Admitting: Anesthesiology

## 2018-07-28 ENCOUNTER — Encounter
Admission: AD | Disposition: A | Discharge status: Home Health | Payer: Self-pay | Source: Home / Self Care | Attending: Internal Medicine

## 2018-07-28 HISTORY — PX: CALCANEAL OSTEOTOMY: SHX1281

## 2018-07-28 LAB — CBC
HCT: 28.7 % — ABNORMAL LOW (ref 39.0–52.0)
HEMOGLOBIN: 8.6 g/dL — AB (ref 13.0–17.0)
MCH: 25.4 pg — AB (ref 26.0–34.0)
MCHC: 30 g/dL (ref 30.0–36.0)
MCV: 84.7 fL (ref 80.0–100.0)
Platelets: 345 10*3/uL (ref 150–400)
RBC: 3.39 MIL/uL — ABNORMAL LOW (ref 4.22–5.81)
RDW: 18.3 % — ABNORMAL HIGH (ref 11.5–15.5)
WBC: 20.2 10*3/uL — ABNORMAL HIGH (ref 4.0–10.5)
nRBC: 0 % (ref 0.0–0.2)

## 2018-07-28 LAB — BASIC METABOLIC PANEL
Anion gap: 10 (ref 5–15)
BUN: 53 mg/dL — AB (ref 6–20)
CO2: 27 mmol/L (ref 22–32)
Calcium: 8.1 mg/dL — ABNORMAL LOW (ref 8.9–10.3)
Chloride: 98 mmol/L (ref 98–111)
Creatinine, Ser: 2.61 mg/dL — ABNORMAL HIGH (ref 0.61–1.24)
GFR calc Af Amer: 31 mL/min — ABNORMAL LOW (ref >60)
GFR calc non Af Amer: 27 mL/min — ABNORMAL LOW (ref >60)
Glucose, Bld: 152 mg/dL — ABNORMAL HIGH (ref 70–99)
Potassium: 3.8 mmol/L (ref 3.5–5.1)
Sodium: 135 mmol/L (ref 135–145)

## 2018-07-28 LAB — GLUCOSE, CAPILLARY
Glucose-Capillary: 112 mg/dL — ABNORMAL HIGH (ref 70–99)
Glucose-Capillary: 81 mg/dL (ref 70–99)
Glucose-Capillary: 103 mg/dL — ABNORMAL HIGH (ref 70–99)
Glucose-Capillary: 198 mg/dL — ABNORMAL HIGH (ref 70–99)
Glucose-Capillary: 415 mg/dL — ABNORMAL HIGH (ref 70–99)

## 2018-07-28 SURGERY — OSTEOTOMY, CALCANEUS
Anesthesia: General | Laterality: Bilateral

## 2018-07-28 MED ORDER — VANCOMYCIN HCL 1000 MG IV SOLR
1000.0000 mg | INTRAVENOUS | Status: DC
  Filled 2018-07-28: qty 1000

## 2018-07-28 MED ORDER — MIDAZOLAM HCL 2 MG/2ML IJ SOLN
INTRAMUSCULAR | Status: AC
  Filled 2018-07-28: qty 2

## 2018-07-28 MED ORDER — PHENYLEPHRINE HCL 10 MG/ML IJ SOLN
INTRAMUSCULAR | Status: DC | PRN
  Administered 2018-07-28 (×5): 100 ug via INTRAVENOUS

## 2018-07-28 MED ORDER — SODIUM CHLORIDE 0.9 % IV SOLN
INTRAVENOUS | Status: DC | PRN
  Administered 2018-07-28: 50 ug/min via INTRAVENOUS

## 2018-07-28 MED ORDER — LIVING WELL WITH DIABETES BOOK
Freq: Once | Status: AC
  Administered 2018-07-28: 15:00:00

## 2018-07-28 MED ORDER — SODIUM CHLORIDE 0.9 % IV SOLN
INTRAVENOUS | Status: DC | PRN
  Administered 2018-07-28: 08:00:00 via INTRAVENOUS

## 2018-07-28 MED ORDER — BUPIVACAINE HCL (PF) 0.5 % IJ SOLN
INTRAMUSCULAR | Status: AC
  Filled 2018-07-28: qty 30

## 2018-07-28 MED ORDER — LIDOCAINE HCL (CARDIAC) PF 100 MG/5ML IV SOSY
PREFILLED_SYRINGE | INTRAVENOUS | Status: DC | PRN
  Administered 2018-07-28: 100 mg via INTRAVENOUS

## 2018-07-28 MED ORDER — LIDOCAINE-EPINEPHRINE 1 %-1:100000 IJ SOLN
INTRAMUSCULAR | Status: AC
  Filled 2018-07-28: qty 1

## 2018-07-28 MED ORDER — ONDANSETRON HCL 4 MG/2ML IJ SOLN
INTRAMUSCULAR | Status: DC | PRN
  Administered 2018-07-28: 4 mg via INTRAVENOUS

## 2018-07-28 MED ORDER — PROPOFOL 10 MG/ML IV BOLUS
INTRAVENOUS | Status: DC | PRN
  Administered 2018-07-28: 200 mg via INTRAVENOUS

## 2018-07-28 MED ORDER — VANCOMYCIN HCL IN DEXTROSE 1-5 GM/200ML-% IV SOLN
1000.0000 mg | INTRAVENOUS | Status: DC
  Administered 2018-07-28: 1000 mg via INTRAVENOUS
  Filled 2018-07-28 (×2): qty 200

## 2018-07-28 MED ORDER — FENTANYL CITRATE (PF) 100 MCG/2ML IJ SOLN
25.0000 ug | INTRAMUSCULAR | Status: DC | PRN
  Administered 2018-07-28: 25 ug via INTRAVENOUS

## 2018-07-28 MED ORDER — MIDAZOLAM HCL 2 MG/2ML IJ SOLN
INTRAMUSCULAR | Status: DC | PRN
  Administered 2018-07-28: 2 mg via INTRAVENOUS

## 2018-07-28 MED ORDER — ACETAMINOPHEN 325 MG PO TABS
325.0000 mg | ORAL_TABLET | Freq: Once | ORAL | Status: AC
  Administered 2018-07-28: 325 mg via ORAL
  Filled 2018-07-28: qty 1

## 2018-07-28 MED ORDER — FENTANYL CITRATE (PF) 100 MCG/2ML IJ SOLN
INTRAMUSCULAR | Status: AC
  Filled 2018-07-28: qty 2

## 2018-07-28 MED ORDER — BUPIVACAINE HCL (PF) 0.5 % IJ SOLN
INTRAMUSCULAR | Status: DC | PRN
  Administered 2018-07-28: 10 mL

## 2018-07-28 MED ORDER — ROCURONIUM BROMIDE 100 MG/10ML IV SOLN
INTRAVENOUS | Status: DC | PRN
  Administered 2018-07-28: 10 mg via INTRAVENOUS
  Administered 2018-07-28: 40 mg via INTRAVENOUS

## 2018-07-28 MED ORDER — PROMETHAZINE HCL 25 MG/ML IJ SOLN: 6.2500 mg | INTRAMUSCULAR | Status: DC | PRN

## 2018-07-28 MED ORDER — DEXAMETHASONE SODIUM PHOSPHATE 10 MG/ML IJ SOLN
INTRAMUSCULAR | Status: DC | PRN
  Administered 2018-07-28: 10 mg via INTRAVENOUS

## 2018-07-28 MED ORDER — SUGAMMADEX SODIUM 500 MG/5ML IV SOLN
INTRAVENOUS | Status: DC | PRN
  Administered 2018-07-28: 200 mg via INTRAVENOUS

## 2018-07-28 MED ORDER — PROPOFOL 10 MG/ML IV BOLUS
INTRAVENOUS | Status: AC
  Filled 2018-07-28: qty 20

## 2018-07-28 MED ORDER — GLYCOPYRROLATE 0.2 MG/ML IJ SOLN
INTRAMUSCULAR | Status: DC | PRN
  Administered 2018-07-28: 0.2 mg via INTRAVENOUS

## 2018-07-28 MED ORDER — FENTANYL CITRATE (PF) 100 MCG/2ML IJ SOLN
INTRAMUSCULAR | Status: DC | PRN
  Administered 2018-07-28: 50 ug via INTRAVENOUS

## 2018-07-28 MED ORDER — SUCCINYLCHOLINE CHLORIDE 20 MG/ML IJ SOLN
INTRAMUSCULAR | Status: DC | PRN
  Administered 2018-07-28: 120 mg via INTRAVENOUS

## 2018-07-28 SURGICAL SUPPLY — 52 items
"PENCIL ELECTRO HAND CTR " (MISCELLANEOUS) ×1 IMPLANT
BAG COUNTER SPONGE EZ (MISCELLANEOUS) ×1 IMPLANT
BANDAGE ELASTIC 4 LF NS (GAUZE/BANDAGES/DRESSINGS) ×4 IMPLANT
BLADE OSCILLATING/SAGITTAL (BLADE) ×2
BLADE SURG 15 STRL LF DISP TIS (BLADE) ×2 IMPLANT
BLADE SURG 15 STRL SS (BLADE) ×4
BLADE SURG MINI STRL (BLADE) ×3 IMPLANT
BLADE SW THK.38XMED LNG THN (BLADE) IMPLANT
BNDG CONFORM 3 STRL LF (GAUZE/BANDAGES/DRESSINGS) ×1 IMPLANT
BNDG ESMARK 4X12 TAN STRL LF (GAUZE/BANDAGES/DRESSINGS) ×1 IMPLANT
BNDG GAUZE 4.5X4.1 6PLY STRL (MISCELLANEOUS) ×2 IMPLANT
CANISTER SUCT 1200ML W/VALVE (MISCELLANEOUS) ×3 IMPLANT
CANISTER WOUND CARE 500ML ATS (WOUND CARE) ×2 IMPLANT
CLOSURE WOUND 1/4X4 (GAUZE/BANDAGES/DRESSINGS)
CONNECTOR Y WND VAC (MISCELLANEOUS) IMPLANT
COUNTER SPONGE BAG EZ (MISCELLANEOUS)
COVER WAND RF STERILE (DRAPES) ×1 IMPLANT
CUFF TOURN 18 STER (MISCELLANEOUS) ×1 IMPLANT
CUFF TOURN DUAL PL 12 NO SLV (MISCELLANEOUS) ×1 IMPLANT
DRSG MEPITEL 4X7.2 (GAUZE/BANDAGES/DRESSINGS) ×2 IMPLANT
DURAPREP 26ML APPLICATOR (WOUND CARE) ×1 IMPLANT
ELECT REM PT RETURN 9FT ADLT (ELECTROSURGICAL) ×3
ELECTRODE REM PT RTRN 9FT ADLT (ELECTROSURGICAL) ×1 IMPLANT
GAUZE PETRO XEROFOAM 1X8 (MISCELLANEOUS) ×3 IMPLANT
GAUZE SPONGE 4X4 12PLY STRL (GAUZE/BANDAGES/DRESSINGS) ×3 IMPLANT
GLOVE BIO SURGEON STRL SZ8 (GLOVE) ×3 IMPLANT
GLOVE INDICATOR 8.0 STRL GRN (GLOVE) ×3 IMPLANT
GOWN STRL REUS W/ TWL LRG LVL3 (GOWN DISPOSABLE) ×2 IMPLANT
GOWN STRL REUS W/TWL LRG LVL3 (GOWN DISPOSABLE) ×4
HANDPIECE VERSAJET DEBRIDEMENT (MISCELLANEOUS) ×2 IMPLANT
KIT DRSG VAC SLVR GRANUFM (MISCELLANEOUS) ×2 IMPLANT
KIT STIMULAN RAPID CURE 5CC (Orthopedic Implant) ×2 IMPLANT
KIT TURNOVER KIT A (KITS) ×3 IMPLANT
LABEL OR SOLS (LABEL) ×3 IMPLANT
NDL FILTER BLUNT 18X1 1/2 (NEEDLE) ×1 IMPLANT
NDL HYPO 25X1 1.5 SAFETY (NEEDLE) ×3 IMPLANT
NEEDLE FILTER BLUNT 18X 1/2SAF (NEEDLE) ×2
NEEDLE FILTER BLUNT 18X1 1/2 (NEEDLE) ×1 IMPLANT
NEEDLE HYPO 25X1 1.5 SAFETY (NEEDLE) ×6 IMPLANT
NS IRRIG 500ML POUR BTL (IV SOLUTION) ×3 IMPLANT
PACK EXTREMITY ARMC (MISCELLANEOUS) ×3 IMPLANT
PAD NEG PRESSURE SENSATRAC (MISCELLANEOUS) ×2 IMPLANT
PENCIL ELECTRO HAND CTR (MISCELLANEOUS) ×3 IMPLANT
RASP SM TEAR CROSS CUT (RASP) ×2 IMPLANT
STOCKINETTE STRL 6IN 960660 (GAUZE/BANDAGES/DRESSINGS) ×3 IMPLANT
STRIP CLOSURE SKIN 1/4X4 (GAUZE/BANDAGES/DRESSINGS) ×1 IMPLANT
SUT VIC AB 3-0 SH 27 (SUTURE) ×2
SUT VIC AB 3-0 SH 27X BRD (SUTURE) ×1 IMPLANT
SUT VIC AB 4-0 FS2 27 (SUTURE) ×3 IMPLANT
SYR 10ML LL (SYRINGE) ×6 IMPLANT
SYR 3ML LL SCALE MARK (SYRINGE) ×3 IMPLANT
WND VAC CONN Y (MISCELLANEOUS) ×2

## 2018-07-28 NOTE — H&P (Signed)
H and P has been reviewed and no changes are noted.  

## 2018-07-28 NOTE — Consult Note (Addendum)
Pharmacy Antibiotic Note  Paul [name]. is a 53 y.o. male admitted on 07/25/2018 with cellulitis.  Pharmacy has been consulted for Vancomycin dosing. The ID physician has discontinued Zosyn and has started the patient on Unasyn.   In preparation of d/c home on IV antibiotics will adjust vancomycin dose/frequency.  Recent outpatient cx growing P. Mirabilis and MRSA  Plan: 1) Vancomycin 1000mg  IV q24 hrs. Goal AUC 400-550. Expected AUC: 450.5 Calculated Css(min): 12.8 SCr used: 2.61  Patient's BMI is 29.9, therefore used Vd coefficient of 0.72 for dosing purposes.    2) Unasyn 1.5 g IV Q 6 hours as dosed by Dr. Donavan Burnet (24hrs), Avg:99.4 F (37.4 C), Min:97.8 F (36.6 C), Max:102 F (38.9 C)  Recent Labs  Lab 07/25/18 1750 07/26/18 0439 07/27/18 0316 07/28/18 0304  WBC 21.4* 17.0*  --  20.2*  CREATININE 2.70* 2.53* 2.39* 2.61*    Estimated Creatinine Clearance: 37.8 mL/min (A) (by C-G formula based on SCr of 2.61 mg/dL (H)).    Allergies  Allergen Reactions  . Ibuprofen Other (See Comments)    Heart problems  . Baclofen Other (See Comments)  . Metformin Diarrhea  . Nsaids     Due to kidney and heart problems per pt    Antimicrobials this admission: Zosyn 3/23 x 1 dose Vancomycin 3/23 >>  Unasyn 3/24 >>  Dose adjustments this admission: None  Microbiology results: 3/23 BCx: NGTD 3/26 R calcaneus bone: pending  Thank you for allowing pharmacy to be a part of this patient's care.  Doreene Eland, PharmD, BCPS.   Work Cell: [phone] 07/28/2018 6:45 PM

## 2018-07-28 NOTE — Anesthesia Procedure Notes (Signed)
Procedure Name: Intubation Date/Time: 07/28/2018 8:11 AM Performed by: Nelda Marseille, CRNA Pre-anesthesia Checklist: Patient identified, Patient being monitored, Timeout performed, Emergency Drugs available and Suction available Patient Re-evaluated:Patient Re-evaluated prior to induction Oxygen Delivery Method: Circle system utilized Preoxygenation: Pre-oxygenation with 100% oxygen Induction Type: IV induction Ventilation: Mask ventilation without difficulty Laryngoscope Size: Mac, 3 and McGraph Grade View: Grade II Tube type: Oral Tube size: 7.5 mm Number of attempts: 1 Airway Equipment and Method: Stylet Placement Confirmation: ETT inserted through vocal cords under direct vision,  positive ETCO2 and breath sounds checked- equal and bilateral Secured at: 21 cm Tube secured with: Tape Dental Injury: Teeth and Oropharynx as per pre-operative assessment

## 2018-07-28 NOTE — Anesthesia Post-op Follow-up Note (Signed)
Anesthesia QCDR form completed.        

## 2018-07-28 NOTE — Anesthesia Preprocedure Evaluation (Signed)
Anesthesia Evaluation  Patient identified by MRN, date of birth, ID band Patient awake    Reviewed: Allergy & Precautions, NPO status , Patient's Chart, lab work & pertinent test results  History of Anesthesia Complications Negative for: history of anesthetic complications  Airway Mallampati: II       Dental  (+) Dental Advidsory Given, Upper Dentures, Lower Dentures, Edentulous Upper, Edentulous Lower   Pulmonary neg shortness of breath, sleep apnea , neg COPD, neg recent URI,           Cardiovascular hypertension, Pt. on medications and Pt. on home beta blockers (-) angina+ CAD, + Past MI, + Peripheral Vascular Disease and +CHF  (-) Cardiac Stents and (-) CABG (-) dysrhythmias + pacemaker + Cardiac Defibrillator (-) Valvular Problems/Murmurs     Neuro/Psych neg Seizures CVA, No Residual Symptoms    GI/Hepatic negative GI ROS, Neg liver ROS,   Endo/Other  diabetes, Type 2, Insulin Dependent  Renal/GU Renal InsufficiencyRenal disease     Musculoskeletal   Abdominal   Peds  Hematology  (+) Blood dyscrasia, anemia ,   Anesthesia Other Findings Past Medical History: No date: Anemia No date: Cardiac defibrillator in place     Comment:  a. Biotronik LUmax 540 DRT, (ser # [id number]). No date: Carotid arterial disease ([place])     Comment:  a. s/p prior LICA stenting;  b. 0/9381 Carotid U/S:               40-59% bilat ICA stenosis. Patent LICA stent. No date: CHF (congestive heart failure) ([facility]) No date: CKD (chronic kidney disease), stage III ([place]) No date: Coronary artery disease     Comment:  a. 2010 s/p CABG x 3. No date: DDD (degenerative disc disease), lumbosacral     Comment:  L5-S1 No date: Diabetes [facility])     Comment:  Lantus at bedtime No date: Gangrene of toe of right foot ([place]) 10/06/2016: Gout of left hand No date: HFrEF (heart failure with reduced ejection fraction) ([place])     Comment:  a. 07/2016 Echo: EF  25-30%, diff HK, mild MR, mildly dil               LA, mod reduced RV fxn, PASP 38mmHg. No date: Hypertension     Comment:  takes Coreg daily No date: Ischemic cardiomyopathy     Comment:  a. 07/2016 Echo: EF 25-30%, diff HK. 2009: Myocardial infarction ([place]) No date: Peripheral vascular disease ([place]) No date: Sleep apnea     Comment:  sleep study yr ago-unable to afford cpap 09: Stroke ([place])     Comment:  no weakness   Reproductive/Obstetrics                             Anesthesia Physical  Anesthesia Plan  ASA: III  Anesthesia Plan: General   Post-op Pain Management:    Induction: Intravenous  PONV Risk Score and Plan: 2 and Treatment may vary due to age, Propofol, Midazolam, Ondansetron and Dexamethasone  Airway Management Planned: Oral ETT  Additional Equipment:   Intra-op Plan:   Post-operative Plan: Extubation in OR  Informed Consent: I have reviewed the patients History and Physical, chart, labs and discussed the procedure including the risks, benefits and alternatives for the proposed anesthesia with the patient or authorized representative who has indicated his/her understanding and acceptance.       Plan Discussed with:   Anesthesia Plan Comments:  Anesthesia Quick Evaluation  

## 2018-07-28 NOTE — Progress Notes (Signed)
Patients still febrile after interventions taking. See mar and flow sheets. Dr. Sidney Ace notified placed new orders.

## 2018-07-28 NOTE — Op Note (Signed)
Operative note   Surgeon: Dr. Albertine Patricia, DPM.    Assistant: None    Preop diagnosis: 1.  Diabetic ulcer/decubitus ulcer right heel with osteomyelitis 2.  Diabetic/decubitus ulcer left heel involving skin and underlying subcutaneous and fat tissue.    Postop diagnosis: Same    Procedure:   1.  Excisional debridement of right heel ulcer with the versa jet to include removal of infected and necrotic skin soft tissue tendon and removal of bone.  A small portion of bone was removed the posterior calcaneus.  Wound is 5.1 cm long and 6.2 cm wide with full-thickness depth down to the bone.   2.  Excisional debridement of necrotic skin and subcutaneous tissue from the left heel ulcer.  Wound is 3.5 cm in length and 3.7 cm in width with depth down to the fatty subcutaneous tissue      EBL: 20 cc    Anesthesia:general delivered by the anesthesia team I injected 10 cc of 0.5% Marcaine plain total preoperatively.    Hemostasis: None    Specimen: Bone culture was sent from the posterior right calcaneus    Complications: None    Operative indications: Chronic nonhealing ulcers with likely osteomyelitis to the right heel    Procedure:  Patient was brought into the OR and placed on the operating table in theprone position. After anesthesia was obtained the right and left lower extremity was prepped and draped in usual sterile fashion.  Operative Report: At this time attention was directed to the left heel where a few pockets of necrosis were identified in the deeper tissues.  This these were removed with sharp dissection and the entire surface was debrided excisionally with a versa jet.  Once this was accomplished good clean tissue was noted a set up for wound VAC was accomplished along with granular foam pad to the region.  Next attention was directed to the right heel where a combination of versa jet was used to remove and debride necrotic tissue from the ulcerative site.  It was noted at this  time that the ulcer traveled down to the posterior calcaneus and CT scan showed an area where a likely portion of osteomyelitis was noted.  There is a very very small area.  Exposed portion of bone was resected at this point and I sent some of this for culture and sensitivity.  I rasped very smoothly and the bone appeared to be solid and unaffected by any type of breakdown process.  No evidence of purulence or sequestration or cortical damage was noted.  Once all tissues were cleaned and debrided I made a small incision dorsally to expose an area of necrosis up underneath the proximal portion of the wound lip.  This was cleaned and debrided and then this area was sutured back with 2 simple interrupted 3 of nylons.  At this point I elected to put vancomycin gentamicin stimuli and beads into the wound and cover this with Mepitel.  This was stapled in place.  Wound VAC with the silver g ranular foam pad was placed across the area at this timeframe.  Wound VAC was hooked up in both areas vacuum very well and had a nice seal.  There is redressed and protected with extra Kerlix and 4 x 4 gauze pads.    Patient tolerated the procedure and anesthesia well.  Was transported from the OR to the PACU with all vital signs stable and vascular status intact. To be discharged per routine protocol.  Will follow  up in approximately 1 week in the outpatient clinic.

## 2018-07-28 NOTE — Anesthesia Postprocedure Evaluation (Signed)
Anesthesia Post Note  Patient: Paul [name].  Procedure(s) Performed: RIGHT CALCANECTOMY BILATERAL DEBRIDEMENT OF ULCERS ON HEELS (Bilateral )  Patient location during evaluation: PACU Anesthesia Type: General Level of consciousness: awake and alert Pain management: pain level controlled Vital Signs Assessment: post-procedure vital signs reviewed and stable Respiratory status: spontaneous breathing, nonlabored ventilation, respiratory function stable and patient connected to nasal cannula oxygen Cardiovascular status: blood pressure returned to baseline and stable Postop Assessment: no apparent nausea or vomiting Anesthetic complications: no     Last Vitals:  Vitals:   07/28/18 1010 07/28/18 1100  BP: 124/69 128/64  Pulse: 84 84  Resp: (!) 24   Temp:  37.3 C  SpO2: 100% 99%    Last Pain:  Vitals:   07/28/18 1100  TempSrc: Oral  PainSc:                  Martha Clan

## 2018-07-28 NOTE — Progress Notes (Signed)
ID Underwent Excisional debridement of right heel ulcer with the versa jet to include removal of infected and necrotic skin soft tissue tendon and removal of bone.  A small portion of bone was removed the posterior calcaneus.  Wound is 5.1 cm long and 6.2 cm wide with full-thickness depth down to the bone.   2.  Excisional debridement of necrotic skin and subcutaneous tissue from the left heel ulcer.  Wound is 3.5 cm in length and 3.7 cm in width with depth down to the fatty subcutaneous tissue   Wound vac in place Spoke to Dr.torxler who thinks he ha svery early osteo, but bone was good- he wuld like for the patient to get Iv antibiotic Bone Culture has been sent As per superficial culture there is MRSA and proteus- vanco+ ceftriaxone or vanco+ unasyn would be the iV option on discharge but patient would like to avoid IV as he has had IV infusion in the past 2 years for a prolonged period and tired of it.  He also has CKD and PICc may not be an option-  Primary team to check with nephrologist regardin PICC vs Power line Discussed with pharmacist Doreene Eland who will help with final antibiotic orders I will follow the patient peripherally the next three days

## 2018-07-28 NOTE — Progress Notes (Signed)
[facility] [place] NAME: Paul [name]    MR#:  [id number]  DATE OF BIRTH:  [birth year]  SUBJECTIVE:  CHIEF COMPLAINT:  No chief complaint on file. Patient underwent debridement of the right heel has pain in the foot   REVIEW OF SYSTEMS:  Review of Systems  Constitutional: Negative for chills and fever.  HENT: Negative for ear discharge, hearing loss and nosebleeds.   Eyes: Negative for blurred vision and double vision.  Respiratory: Negative for cough, shortness of breath and wheezing.   Cardiovascular: Positive for leg swelling. Negative for chest pain and palpitations.  Gastrointestinal: Negative for abdominal pain, constipation, diarrhea, nausea and vomiting.  Genitourinary: Negative for dysuria.  Musculoskeletal: Positive for myalgias.  Neurological: Negative for dizziness, focal weakness, seizures, weakness and headaches.  Psychiatric/Behavioral: Negative for depression.    DRUG ALLERGIES:   Allergies  Allergen Reactions  . Ibuprofen Other (See Comments)    Heart problems  . Baclofen Other (See Comments)  . Metformin Diarrhea  . Nsaids     Due to kidney and heart problems per pt    VITALS:  Blood pressure 128/64, pulse 84, temperature 99.1 F (37.3 C), temperature source Oral, resp. rate (!) 24, height 5\' 10"  (1.778 m), weight 94.8 kg, SpO2 99 %.  PHYSICAL EXAMINATION:  Physical Exam  GENERAL:  53 y.o.-year-old patient lying in the bed with no acute distress.  EYES: Pupils equal, round, reactive to light and accommodation. No scleral icterus. Extraocular muscles intact.  HEENT: Head atraumatic, normocephalic. Oropharynx and nasopharynx clear.  NECK:  Supple, no jugular venous distention. No thyroid enlargement, no tenderness.  LUNGS: Normal breath sounds bilaterally, no wheezing, rales,rhonchi or crepitation. No use of accessory muscles of respiration. Decreased bibasilar breath sounds.  CARDIOVASCULAR: S1, S2 normal.  No  rubs, or gallops. 2/6 systolic murmur in place. AICD in place  ABDOMEN: Soft, nontender, nondistended. Bowel sounds present. No organomegaly or mass.  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 Both feet heel ulcers noted as below - No  cyanosis, or clubbing.  Bilateral lower extremity edema NEUROLOGIC: Cranial nerves II through XII are intact. Muscle strength 5/5 in all extremities. Sensation intact. Gait not checked.  PSYCHIATRIC: The patient is alert and oriented x 3.  SKIN: No obvious rash, lesion, or ulcer.     RIGHT HEEL: see above     LEFT HEEL: see above  LABORATORY PANEL:   CBC Recent Labs  Lab 07/28/18 0304  WBC 20.2*  HGB 8.6*  HCT 28.7*  PLT 345   ------------------------------------------------------------------------------------------------------------------  Chemistries  Recent Labs  Lab 07/28/18 0304  NA 135  K 3.8  CL 98  CO2 27  GLUCOSE 152*  BUN 53*  CREATININE 2.61*  CALCIUM 8.1*   ------------------------------------------------------------------------------------------------------------------  Cardiac Enzymes No results for input(s): TROPONINI in the last 168 hours. ------------------------------------------------------------------------------------------------------------------  RADIOLOGY:  No results found.  EKG:   Orders placed or performed during the hospital encounter of 05/24/18  . ED EKG  . ED EKG  . EKG 12-Lead  . EKG 12-Lead    ASSESSMENT AND PLAN:   53 year old male with past medical history of stroke, carotid stenosis status post left carotid stent, CAD status post CABG,  ischemic cardiomyopathy status post defibrillator, A. fib on Eliquis, diabetes, nonhealing diabetic heel ulcers presents to hospital secondary to worsening right heel ulceration.  1.  Right heel nonhealing ulcer with osteomyelitis-WBC   elevated. - on IV Vanco and Unasyn as  per ID recommendations. -Outpatient cultures growing MRSA and  Proteus -ID and podiatry consult appreciated -Urine debridement again today appreciate podiatry input-plan for partial calcanectomy. -CT on admission of the right ankle and calcaneum showing mild new cortical bone loss changes concerning for evolving osteomyelitis. -Status post angiogram with angioplasty  2.  Diabetes mellitus-continue Lantus, sliding scale insulin. -On gabapentin for diabetic neuropathy  3. CKD stage 4: cr at baseline. Monitor  4.  CAD-stable.  Status post CABG in 2010 and ICD placement.  Recently battery change done last year. -Continue aspirin, Coreg  5.  DVT prophylaxis-teds and SCDs.  Heparin on hold for possible procedures   PT consult after debridement by podiatry   All the records are reviewed and case discussed with Care Management/Social Workerr. Management plans discussed with the patient, family and they are in agreement.  CODE STATUS: Full Code  TOTAL TIME TAKING CARE OF THIS PATIENT: 31 minutes.   POSSIBLE D/C IN 3-4 DAYS, DEPENDING ON CLINICAL CONDITION.   Dustin Flock M.D on 07/28/2018 at 2:58 PM  Between 7am to 6pm - Pager - [phone]  After 6pm go to [email or web address] - password [email or web address] [facility]  Office  [phone]  CC: Primary care physician; Casilda Carls, MD

## 2018-07-28 NOTE — Transfer of Care (Signed)
Immediate Anesthesia Transfer of Care Note  Patient: Paul [name].  Procedure(s) Performed: RIGHT CALCANECTOMY BILATERAL DEBRIDEMENT OF ULCERS ON HEELS (Bilateral )  Patient Location: PACU  Anesthesia Type:General  Level of Consciousness: sedated  Airway & Oxygen Therapy: Patient Spontanous Breathing and Patient connected to face mask oxygen  Post-op Assessment: Report given to RN and Post -op Vital signs reviewed and stable  Post vital signs: Reviewed and stable  Last Vitals:  Vitals Value Taken Time  BP    Temp    Pulse 76 07/28/2018  9:01 AM  Resp 25 07/28/2018  9:01 AM  SpO2 100 % 07/28/2018  9:01 AM  Vitals shown include unvalidated device data.  Last Pain:  Vitals:   07/28/18 0400  TempSrc: Oral  PainSc:       Patients Stated Pain Goal: 0 (32/41/99 1444)  Complications: No apparent anesthesia complications

## 2018-07-29 ENCOUNTER — Inpatient Hospital Stay: Payer: Medicare Other

## 2018-07-29 LAB — RPR: RPR: NONREACTIVE

## 2018-07-29 LAB — GLUCOSE, CAPILLARY
Glucose-Capillary: 390 mg/dL — ABNORMAL HIGH (ref 70–99)
Glucose-Capillary: 308 mg/dL — ABNORMAL HIGH (ref 70–99)
Glucose-Capillary: 218 mg/dL — ABNORMAL HIGH (ref 70–99)
Glucose-Capillary: 212 mg/dL — ABNORMAL HIGH (ref 70–99)

## 2018-07-29 LAB — CREATININE, SERUM
Creatinine, Ser: 3.35 mg/dL — ABNORMAL HIGH (ref 0.61–1.24)
GFR calc non Af Amer: 20 mL/min — ABNORMAL LOW (ref >60)
GFR, EST AFRICAN AMERICAN: 23 mL/min — AB (ref >60)

## 2018-07-29 MED ORDER — LINEZOLID 600 MG PO TABS
600.0000 mg | ORAL_TABLET | Freq: Two times a day (BID) | ORAL | Status: DC
  Administered 2018-07-29 – 2018-08-02 (×8): 600 mg via ORAL
  Filled 2018-07-29 (×9): qty 1

## 2018-07-29 MED ORDER — SODIUM CHLORIDE 0.9 % IV SOLN
3.0000 g | Freq: Two times a day (BID) | INTRAVENOUS | Status: DC
  Administered 2018-07-29 – 2018-08-01 (×6): 3 g via INTRAVENOUS
  Filled 2018-07-29 (×8): qty 3

## 2018-07-29 MED ORDER — VANCOMYCIN HCL 10 G IV SOLR: 1750.0000 mg | INTRAVENOUS | Status: DC

## 2018-07-29 MED ORDER — SODIUM CHLORIDE 0.9 % IV SOLN
1.5000 g | Freq: Four times a day (QID) | INTRAVENOUS | Status: DC
  Administered 2018-07-29: 1.5 g via INTRAVENOUS
  Filled 2018-07-29 (×4): qty 1.5

## 2018-07-29 MED ORDER — INSULIN ASPART 100 UNIT/ML ~~LOC~~ SOLN: 0.0000 [IU] | Freq: Three times a day (TID) | SUBCUTANEOUS | Status: DC

## 2018-07-29 MED ORDER — INSULIN ASPART 100 UNIT/ML ~~LOC~~ SOLN
0.0000 [IU] | Freq: Three times a day (TID) | SUBCUTANEOUS | Status: DC
  Administered 2018-07-29 (×2): 3 [IU] via SUBCUTANEOUS
  Administered 2018-07-30 – 2018-07-31 (×5): 1 [IU] via SUBCUTANEOUS
  Administered 2018-07-31: 2 [IU] via SUBCUTANEOUS
  Administered 2018-08-01: 1 [IU] via SUBCUTANEOUS
  Administered 2018-08-01: 2 [IU] via SUBCUTANEOUS
  Filled 2018-07-29 (×10): qty 1

## 2018-07-29 NOTE — Progress Notes (Signed)
ID With worsening crweatinine, bone culture result not available and wbc still high this patient cannot be discharged until the above results. Want to avoid IV vancomycin in his case-

## 2018-07-29 NOTE — Progress Notes (Signed)
Inpatient Diabetes Program Recommendations  AACE/ADA: New Consensus Statement on Inpatient Glycemic Control  Target Ranges:  Prepandial:   less than 140 mg/dL      Peak postprandial:   less than 180 mg/dL (1-2 hours)      Critically ill patients:  140 - 180 mg/dL   Results for Paul [name], Paul [name] (MRN [id number]) as of 07/29/2018 09:06  Ref. Range 07/28/2018 06:13 07/28/2018 09:08 07/28/2018 11:30 07/28/2018 16:41 07/28/2018 21:27 07/29/2018 07:47  Glucose-Capillary Latest Ref Range: 70 - 99 mg/dL 112 (H) 81 103 (H) 198 (H) 415 (H) 390 (H)   Review of Glycemic Control  Diabetes history: DM2 Outpatient Diabetes medications: Lantus 30 units QHS Current orders for Inpatient glycemic control: Lantus 30 units QHS, Novolog 0-9 units TID with meals; Prednisone 5 mg QAM   Inpatient Diabetes Program Recommendations:  Insulin - Correction: Please consider ordering Novolog 0-5 units QHS for bedtime correction.  NOTE: In reviewing chart, noted patient received Decadron 10 mg x 1 on 07/28/18 at 8:05 am which is contributing to hyperglycemia. CBG 415 mg/dl at bedtime last night but no Novolog given for correction since no bedtime Novolog ordered.   Thanks, Barnie Alderman, RN, MSN, CDE Diabetes Coordinator Inpatient Diabetes Program [phone] (Team Pager from 8am to 5pm)

## 2018-07-29 NOTE — Consult Note (Signed)
[facility]  CONSULT NOTE    Date: 07/29/2018                  Patient Name:  Paul [name].  MRN: [id number]  DOB: Apr 12, 1966  Age / Sex: 53 y.o., male         PCP: Casilda Carls, MD                 Service Requesting Consult: Dr. Dustin Flock                 Reason for Consult: Acute renal failure            History of Present Illness: [name] is a 53 y.o. black male who was admitted to [facility] on 07/25/2018 for Osteomyelitis rt Heel  Patient underwent surgery by Dr. Elvina Mattes on 3/26 for right heel ulcer debridement. Found to have osteomyelitis. Multiple organisms on cultures. ID following. Current regimen of unasyn and linezolid.   Patient states he has been diagnosed with chronic kidney disease stage IV for many years was previously following with a nephrologist in [facility]. Secondary to diabetes.    Medications: Outpatient medications: Medications Prior to Admission  Medication Sig Dispense Refill Last Dose  . allopurinol (ZYLOPRIM) 100 MG tablet Take 0.5 tablets (50 mg total) by mouth daily. (Patient taking differently: Take 100 mg by mouth daily. ) 30 tablet 0 Past Week at Unknown time  . aspirin EC 81 MG tablet Take 1 tablet (81 mg total) by mouth daily. 90 tablet 3 Taking  . Cholecalciferol (VITAMIN D3) 1.25 MG (50000 UT) CAPS Take 1 capsule by mouth once a week.   Past Week at Unknown time  . gabapentin (NEURONTIN) 100 MG capsule Take 200 mg by mouth 3 (three) times daily.    Taking  . acetaminophen (TYLENOL) 325 MG tablet Take 2 tablets (650 mg total) by mouth every 6 (six) hours as needed for mild pain (or Fever >/= 101).   PRN at PRN  . apixaban (ELIQUIS) 5 MG TABS tablet Take 1 tablet (5 mg total) by mouth 2 (two) times daily. (Patient not taking: Reported on 07/25/2018) 60 tablet 0 Not Taking at Unknown time  . carvedilol (COREG) 3.125 MG tablet Take 1 tablet (3.125 mg total) by mouth 2 (two) times daily. (Patient not taking:  Reported on 07/27/2018) 60 tablet 0 Not Taking at Unknown time  . insulin glargine (LANTUS) 100 unit/mL SOPN Inject 0.2 mLs (20 Units total) into the skin at bedtime. (Patient not taking: Reported on 07/27/2018) 6 mL 0 Not Taking at Unknown time  . oxyCODONE 10 MG TABS Take 1 tablet (10 mg total) by mouth every 6 (six) hours as needed for moderate pain. (Patient not taking: Reported on 07/27/2018) 20 tablet 0 Not Taking at Unknown time  . potassium chloride SA (K-DUR,KLOR-CON) 20 MEQ tablet Take 1 tablet (20 mEq total) by mouth daily. (Patient not taking: Reported on 07/25/2018) 30 tablet 0 Not Taking at Unknown time  . predniSONE (DELTASONE) 5 MG tablet 3 tabs po daily for three days then 2 tabs po daily for three days then one tab po daily afterwards (Patient not taking: Reported on 07/27/2018) 40 tablet 0 Not Taking at Unknown time  . torsemide (DEMADEX) 20 MG tablet Take 2 tablets (40 mg total) by mouth 2 (two) times daily. (Patient not taking: Reported on 07/27/2018) 60 tablet 0 Not Taking at Unknown time    Current medications:  Current Facility-Administered Medications  Medication Dose Route Frequency Provider Last Rate Last Dose  . acetaminophen (TYLENOL) tablet 650 mg  650 mg Oral Q6H PRN Algernon Huxley, MD   650 mg at 07/29/18 3546   Or  . acetaminophen (TYLENOL) suppository 650 mg  650 mg Rectal Q6H PRN Algernon Huxley, MD      . allopurinol (ZYLOPRIM) tablet 50 mg  50 mg Oral Daily Algernon Huxley, MD   50 mg at 07/29/18 0958  . Ampicillin-Sulbactam (UNASYN) 3 g in sodium chloride 0.9 % 100 mL IVPB  3 g Intravenous Q12H Ravishankar, Joellyn Quails, MD      . aspirin EC tablet 81 mg  81 mg Oral Daily Algernon Huxley, MD   81 mg at 07/29/18 0957  . atorvastatin (LIPITOR) tablet 10 mg  10 mg Oral q1800 Algernon Huxley, MD   10 mg at 07/28/18 1614  . carvedilol (COREG) tablet 3.125 mg  3.125 mg Oral BID Algernon Huxley, MD   3.125 mg at 07/29/18 0957  . clopidogrel (PLAVIX) tablet 75 mg  75 mg Oral Daily Algernon Huxley,  MD   75 mg at 07/29/18 0958  . gabapentin (NEURONTIN) capsule 400 mg  400 mg Oral TID Algernon Huxley, MD   400 mg at 07/29/18 0957  . insulin aspart (novoLOG) injection 0-9 Units  0-9 Units Subcutaneous TID WC Algernon Huxley, MD   7 Units at 07/29/18 1206  . insulin glargine (LANTUS) injection 30 Units  30 Units Subcutaneous QHS Algernon Huxley, MD   30 Units at 07/28/18 2140  . linezolid (ZYVOX) tablet 600 mg  600 mg Oral Q12H Ravishankar, Joellyn Quails, MD      . mupirocin ointment (BACTROBAN) 2 %   Nasal BID Gladstone Lighter, MD   1 application at 56/81/27 1007  . oxyCODONE (Oxy IR/ROXICODONE) immediate release tablet 10 mg  10 mg Oral Q6H PRN Algernon Huxley, MD   10 mg at 07/29/18 1204  . predniSONE (DELTASONE) tablet 5 mg  5 mg Oral Q breakfast Algernon Huxley, MD   5 mg at 07/29/18 0816  . torsemide (DEMADEX) tablet 40 mg  40 mg Oral BID Algernon Huxley, MD   40 mg at 07/29/18 5170      Allergies: Allergies  Allergen Reactions  . Ibuprofen Other (See Comments)    Heart problems  . Baclofen Other (See Comments)  . Metformin Diarrhea  . Nsaids     Due to kidney and heart problems per pt      Past Medical History: Past Medical History:  Diagnosis Date  . Anemia   . Cardiac defibrillator in place    a. Biotronik LUmax 540 DRT, (ser # [id number]).  . Carotid arterial disease ([place])    a. s/p prior LICA stenting;  b. 10/7589 Carotid U/S: 40-59% bilat ICA stenosis. Patent LICA stent.  . CHF (congestive heart failure) ([place])   . CKD (chronic kidney disease), stage III ([place])   . Coronary artery disease    a. 2010 s/p CABG x 3.  . DDD (degenerative disc disease), lumbosacral    L5-S1  . Diabetes ([place])    Lantus at bedtime  . Gangrene of toe of right foot ([place])   . Gout of left hand 10/06/2016  . HFrEF (heart failure with reduced ejection fraction) ([place])    a. 07/2016 Echo: EF 25-30%, diff HK, mild MR, mildly dil LA, mod reduced RV fxn, PASP 22mmHg.  Marland Kitchen Hypertension  takes Coreg daily  . Ischemic  cardiomyopathy    a. 07/2016 Echo: EF 25-30%, diff HK.  . Myocardial infarction ([place]) 2009  . Peripheral vascular disease ([place])   . Sleep apnea    sleep study yr ago-unable to afford cpap  . Stroke [facility]) 09   no weakness     Past Surgical History: Past Surgical History:  Procedure Laterality Date  . AMPUTATION TOE Right 08/22/2016   Procedure: AMPUTATION TOE;  Surgeon: Samara Deist, DPM;  Location: [facility];  Service: Podiatry;  Laterality: Right;  . AMPUTATION TOE Right 10/09/2016   Procedure: AMPUTATION TOE-RIGHT 2ND MPJ;  Surgeon: Samara Deist, DPM;  Location: [facility];  Service: Podiatry;  Laterality: Right;  . APLIGRAFT PLACEMENT Right 10/09/2016   Procedure: APLIGRAFT PLACEMENT;  Surgeon: Samara Deist, DPM;  Location: [facility];  Service: Podiatry;  Laterality: Right;  . CALCANEAL OSTEOTOMY Bilateral 07/28/2018   Procedure: RIGHT CALCANECTOMY BILATERAL DEBRIDEMENT OF ULCERS ON HEELS;  Surgeon: Albertine Patricia, DPM;  Location: [facility];  Service: Podiatry;  Laterality: Bilateral;  . CARDIAC DEFIBRILLATOR PLACEMENT  2011  . CAROTID ENDARTERECTOMY Left   . CHOLECYSTECTOMY  2010  . CORONARY ARTERY BYPASS GRAFT  2010   CABG x 3   . GRAFT APPLICATION Right 5/88/5027   Procedure: FULL THICKNESS SKIN GRAFT-RIGHT FOOT;  Surgeon: Algernon Huxley, MD;  Location: [facility];  Service: Vascular;  Laterality: Right;  . HIP SURGERY Right 1994  . INCISION AND DRAINAGE OF WOUND Right 08/22/2016   Procedure: IRRIGATION AND DEBRIDEMENT WOUND and wound vac placement;  Surgeon: Samara Deist, DPM;  Location: [facility];  Service: Podiatry;  Laterality: Right;  . LOWER EXTREMITY ANGIOGRAPHY Right 08/24/2016   Procedure: Lower Extremity Angiography;  Surgeon: Algernon Huxley, MD;  Location: [place] [facility];  Service: Cardiovascular;  Laterality: Right;  . LOWER EXTREMITY ANGIOGRAPHY Right 07/27/2018   Procedure: RIGHT Lower Extremity Angiography;  Surgeon: Algernon Huxley, MD;  Location: [place] [facility];   Service: Cardiovascular;  Laterality: Right;  . MASS EXCISION Right 07/18/2014   Procedure: EXCISION HETEROTOPIC BONE RIGHT HIP;  Surgeon: Frederik Pear, MD;  Location: [place];  Service: Orthopedics;  Laterality: Right;  . Open Heart Surgery  2010   x 3  . WOUND DEBRIDEMENT Right 10/09/2016   Procedure: DEBRIDEMENT WOUND;  Surgeon: Samara Deist, DPM;  Location: [facility];  Service: Podiatry;  Laterality: Right;     Family History: Family History  Problem Relation Age of Onset  . Hypertension Other   . Diabetes Other   . Diabetes Father   . Hyperlipidemia Father   . Hypertension Father   . Hyperlipidemia Sister   . Hypertension Sister   . Diabetes Sister   . Diabetes Brother   . Hypertension Brother   . Hyperlipidemia Brother      Social History: Social History   Socioeconomic History  . Marital status: Legally Separated    Spouse name: Not on file  . Number of children: Not on file  . Years of education: Not on file  . Highest education level: Not on file  Occupational History  . Not on file  Social Needs  . Financial resource strain: Not on file  . Food insecurity:    Worry: Not on file    Inability: Not on file  . Transportation needs:    Medical: Not on file    Non-medical: Not on file  Tobacco Use  . Smoking status: Never Smoker  . Smokeless tobacco:  Never Used  Substance and Sexual Activity  . Alcohol use: No  . Drug use: No  . Sexual activity: Not on file  Lifestyle  . Physical activity:    Days per week: Not on file    Minutes per session: Not on file  . Stress: Not on file  Relationships  . Social connections:    Talks on phone: Not on file    Gets together: Not on file    Attends religious service: Not on file    Active member of club or organization: Not on file    Attends meetings of clubs or organizations: Not on file    Relationship status: Not on file  . Intimate partner violence:    Fear of current or ex partner: Not on file    Emotionally  abused: Not on file    Physically abused: Not on file    Forced sexual activity: Not on file  Other Topics Concern  . Not on file  Social History Narrative  . Not on file     Review of Systems: Review of Systems  Constitutional: Negative.  Negative for chills, diaphoresis, fever, malaise/fatigue and weight loss.  HENT: Negative.  Negative for congestion, ear discharge, ear pain, hearing loss, nosebleeds, sinus pain, sore throat and tinnitus.   Eyes: Negative.  Negative for blurred vision, double vision, photophobia, pain, discharge and redness.  Respiratory: Negative.  Negative for cough, hemoptysis, sputum production, shortness of breath, wheezing and stridor.   Cardiovascular: Negative.  Negative for chest pain, palpitations, orthopnea, claudication, leg swelling and PND.  Gastrointestinal: Negative.  Negative for abdominal pain, blood in stool, constipation, diarrhea, heartburn, melena, nausea and vomiting.  Genitourinary: Negative.  Negative for dysuria, flank pain, frequency, hematuria and urgency.  Musculoskeletal: Negative.  Negative for back pain, falls, joint pain, myalgias and neck pain.  Skin: Negative.  Negative for itching and rash.  Neurological: Negative.  Negative for dizziness, tingling, tremors, sensory change, speech change, focal weakness, seizures, loss of consciousness, weakness and headaches.  Endo/Heme/Allergies: Negative.  Negative for environmental allergies and polydipsia. Does not bruise/bleed easily.  Psychiatric/Behavioral: Negative.  Negative for depression, hallucinations, memory loss, substance abuse and suicidal ideas. The patient is not nervous/anxious and does not have insomnia.     Vital Signs: Blood pressure (!) 152/84, pulse 89, temperature 98.1 F (36.7 C), temperature source Oral, resp. rate 18, height 5\' 10"  (1.778 m), weight 94.8 kg, SpO2 100 %.  Weight trends: Filed Weights   07/25/18 1800  Weight: 94.8 kg    Physical Exam: General:  NAD,   Head: Normocephalic, atraumatic. Moist oral mucosal membranes  Eyes: Anicteric, PERRL  Neck: Supple, trachea midline  Lungs:  Clear to auscultation  Heart: Regular rate and rhythm  Abdomen:  Soft, nontender  Extremities: ++ peripheral edema. Right heel wound vac  Neurologic: Nonfocal, moving all four extremities  Skin: No lesions        Lab results: Basic Metabolic Panel: Recent Labs  Lab 07/26/18 0439 07/27/18 0316 07/28/18 0304 07/29/18 0337  NA 135 138 135  --   K 3.9 3.6 3.8  --   CL 98 98 98  --   CO2 27 30 27   --   GLUCOSE 175* 146* 152*  --   BUN 58* 56* 53*  --   CREATININE 2.53* 2.39* 2.61* 3.35*  CALCIUM 8.5* 9.1 8.1*  --     Liver Function Tests: No results for input(s): AST, ALT, ALKPHOS, BILITOT, PROT, ALBUMIN in the  last 168 hours. No results for input(s): LIPASE, AMYLASE in the last 168 hours. No results for input(s): AMMONIA in the last 168 hours.  CBC: Recent Labs  Lab 07/25/18 1750 07/26/18 0439 07/28/18 0304  WBC 21.4* 17.0* 20.2*  HGB 10.2* 8.6* 8.6*  HCT 33.7* 28.6* 28.7*  MCV 82.4 82.7 84.7  PLT 429* 379 345    Cardiac Enzymes: No results for input(s): CKTOTAL, CKMB, CKMBINDEX, TROPONINI in the last 168 hours.  BNP: Invalid input(s): POCBNP  CBG: Recent Labs  Lab 07/28/18 1130 07/28/18 1641 07/28/18 2127 07/29/18 0747 07/29/18 1139  GLUCAP 103* 198* 415* 390* 308*    Microbiology: Results for orders placed or performed during the hospital encounter of 07/25/18  Culture, blood (routine x 2)     Status: None (Preliminary result)   Collection Time: 07/25/18  5:58 PM  Result Value Ref Range Status   Specimen Description BLOOD LEFT ANTECUBITAL  Final   Special Requests   Final    BOTTLES DRAWN AEROBIC AND ANAEROBIC Blood Culture adequate volume   Culture   Final    NO GROWTH 4 DAYS Performed at [facility], [address], [place], [name] [address]    Report Status PENDING  Incomplete  Culture, blood  (routine x 2)     Status: None (Preliminary result)   Collection Time: 07/25/18  6:11 PM  Result Value Ref Range Status   Specimen Description BLOOD BLOOD RIGHT FOREARM  Final   Special Requests   Final    BOTTLES DRAWN AEROBIC AND ANAEROBIC Blood Culture adequate volume   Culture   Final    NO GROWTH 4 DAYS Performed at [facility], [address], [place], [place] [address]    Report Status PENDING  Incomplete  MRSA PCR Screening     Status: Abnormal   Collection Time: 07/27/18  8:32 AM  Result Value Ref Range Status   MRSA by PCR POSITIVE (A) NEGATIVE Final    Comment:        The GeneXpert MRSA Assay (FDA approved for NASAL specimens only), is one component of a comprehensive MRSA colonization surveillance program. It is not intended to diagnose MRSA infection nor to guide or monitor treatment for MRSA infections. RESULT CALLED TO, READ BACK BY AND VERIFIED WITH:  JENNIFER COLES AT 1005 07/27/18 SDR Performed at [facility], [address] [place], [place] [address]   Aerobic/Anaerobic Culture (surgical/deep wound)     Status: None (Preliminary result)   Collection Time: 07/28/18  8:49 AM  Result Value Ref Range Status   Specimen Description   Final    BONE RIGHT CALCANEUS Performed at [facility], [place] [address], [place], [place] [address]    Special Requests   Final    NONE Performed at [facility], [address] [place], [place] [address]    Gram Stain   Final    RARE WBC PRESENT,BOTH PMN AND MONONUCLEAR NO ORGANISMS SEEN    Culture   Final    CULTURE REINCUBATED FOR BETTER GROWTH Performed at [facility], [place] [address], [place], [place] [address]    Report Status PENDING  Incomplete    Coagulation Studies: No results for input(s): LABPROT, INR in the last 72 hours.  Urinalysis: No results for input(s): COLORURINE, LABSPEC, PHURINE, GLUCOSEU, HGBUR, BILIRUBINUR, KETONESUR, PROTEINUR, UROBILINOGEN, NITRITE,  LEUKOCYTESUR in the last 72 hours.  Invalid input(s): APPERANCEUR    Imaging:  No results found.   Assessment &  Plan: Mr. Ashely Joshua. is a 53 y.o. black male with diabetes mellitus type II, hypertension,coronary artery disease, congestive heart failure, ICD, carotid stenosis, CVA, obstructive sleep apnea, peripheral vascular disease, gout, who was admitted to [facility] on 07/25/2018 for Osteomyelitis rt Heel  1. Acute renal failure on chronic kidney disease stage IV: Creatinine 2.67, GFR of 30 on 05/29/18.  Chronic kidney disease secondary to diabetic nephropathy - not currently on an ACE-I/ARB - Check renal ultrasound - Discussed chronic kidney disease and dialysis.   2. Right heel osteomyelitis: if patient will need IV antibiotics. With leukocytosis. Recommend central line (Powerline)  2. Hypertension: 152/84 - carvedilol and torsemide.   3. Anemia: Hemoglobin 8.6. Normocytic.   4. Diabetes mellitus type II with chronic kidney disease: insulin dependent. Hemoglobin A1c of 7.9%.  Complication of diabetic foot ulcer with osteomyelitis.   LOS: 4 Severus Brodzinski 3/27/20201:26 PM

## 2018-07-29 NOTE — Evaluation (Signed)
Physical Therapy Evaluation Patient Details Name: Paul [name]. MRN: [id number] DOB: Jan 16, 1966 Today's Date: 07/29/2018   History of Present Illness  Pt is a 53 y.o. male presenting to hospital 07/25/18 with chronic ulcerations B heels (likely osteomyelitis R heel).  S/p angio 07/27/18.  S/p debridement B heel ulcers 07/28/18 (R heel more extensive debridement with removal of small posterior portion of calcaneous).  PMH includes cervical radiculopathy, neuropathy, sleep apnea, anemia, cardiac defibrillator, CHF, CKD, CAD s/p CABG x3, DM, gout, htn, MI, stroke, R transmetatarsal amputation, h/o R hip surgery.  Clinical Impression  Prior to hospital admission, pt was ambulatory household distances with 4ww.  Pt lives with his friend/landlord in 1 level home with 3 steps (R railing) to enter.  Currently pt is mod assist to stand from bed and min assist to transfer with walker bed to recliner; pt with minimal [place] R foot during session's activities.  Pt would benefit from skilled PT to address noted impairments and functional limitations (see below for any additional details).  Upon hospital discharge, currently recommend pt discharge to [facility] but will monitor pt's progress (pt would like to discharge home if possible).    Follow Up Recommendations SNF    Equipment Recommendations  Rolling walker with 5" wheels;Wheelchair (measurements PT);Wheelchair cushion (measurements PT);3in1 (PT)    Recommendations for Other Services       Precautions / Restrictions Precautions Precautions: Fall Restrictions Weight Bearing Restrictions: Yes RLE Weight Bearing: Partial weight bearing RLE Partial Weight Bearing Percentage or Pounds: "Only 50% or less weight on the right foot with a walker."  "The right heel ulcer involve some necrosis of the Achilles tendon at its insertion and I am concerned about too much pressure on this area potentially causing a rupture hence the limited weightbearing on the right  foot." LLE Weight Bearing: Weight bearing as tolerated Other Position/Activity Restrictions: "Transfer to bedside chair and bedside toilet.  Can bear weight on the left foot on the right foot really only 50% or less weight on the right foot with a walker."      Mobility  Bed Mobility Overal bed mobility: Needs Assistance Bed Mobility: Supine to Sit     Supine to sit: Supervision;HOB elevated     General bed mobility comments: SBA for lines  Transfers Overall transfer level: Needs assistance Equipment used: Rolling walker (2 wheeled) Transfers: Sit to/from [facility] Sit to Stand: Mod assist Stand pivot transfers: Min assist       General transfer comment: assist to initiate and come to full stand up to RW (minimal WB'ing R LE); pt able to pivot a little on L forefoot and then take a few steps with RW with minimal WB'ing R LE  Ambulation/Gait             General Gait Details: Deferred (orders for transfers only)  Stairs            Wheelchair Mobility    Modified Rankin (Stroke Patients Only)       Balance Overall balance assessment: Needs assistance Sitting-balance support: No upper extremity supported;Feet supported Sitting balance-Leahy Scale: Normal Sitting balance - Comments: steady sitting reaching outside BOS   Standing balance support: Bilateral upper extremity supported Standing balance-Leahy Scale: Fair Standing balance comment: steady static standing with B UE support on RW                             Pertinent Vitals/Pain  Pain Assessment: 0-10 Pain Score: 6  Pain Location: L foot/heel Pain Descriptors / Indicators: Tender;Sore Pain Intervention(s): Limited activity within patient's tolerance;Monitored during session;Premedicated before session;Repositioned     Home Living Family/patient expects to be discharged to:: Private residence Living Arrangements: Non-relatives/Friends(Male  friend/landlord) Available Help at Discharge: Friend(s);Available PRN/intermittently Type of Home: House Home Access: Stairs to enter Entrance Stairs-Rails: Right Entrance Stairs-Number of Steps: 3 Home Layout: One level Home Equipment: Walker - 2 wheels;Walker - 4 wheels      Prior Function Level of Independence: Independent with assistive device(s)         Comments: Ambulates household distances with 4ww (uses RW in community--keeps walker in car); house too small for manual w/c so pt reports he doesn't have it anymore.  Pt reports fall 2 months ago.  Pt reports going to rehab for "99 days" and got out January 15th.     Hand Dominance        Extremity/Trunk Assessment   Upper Extremity Assessment Upper Extremity Assessment: Overall WFL for tasks assessed    Lower Extremity Assessment Lower Extremity Assessment: Generalized weakness    Cervical / Trunk Assessment Cervical / Trunk Assessment: Normal  Communication   Communication: No difficulties  Cognition Arousal/Alertness: Awake/alert Behavior During Therapy: WFL for tasks assessed/performed Overall Cognitive Status: Within Functional Limits for tasks assessed                                 General Comments: Talkative      General Comments General comments (skin integrity, edema, etc.): dressings and wound vacs B heels.  Pt agreeable to PT session.    Exercises  Transfer technique and Palmer precautions.   Assessment/Plan    PT Assessment Patient needs continued PT services  PT Problem List Decreased strength;Decreased range of motion;Decreased balance;Decreased activity tolerance;Decreased mobility;Decreased knowledge of use of DME;Decreased knowledge of precautions;Impaired sensation;Pain;Decreased skin integrity       PT Treatment Interventions DME instruction;Functional mobility training;Therapeutic activities;Therapeutic exercise;Balance training;Patient/family education    PT Goals  (Current goals can be found in the Care Plan section)  Acute Rehab PT Goals Patient Stated Goal: to go home PT Goal Formulation: With patient Time For Goal Achievement: 08/12/18 Potential to Achieve Goals: Fair    Frequency Min 2X/week   Barriers to discharge Decreased caregiver support      Co-evaluation               AM-PAC PT "6 Clicks" Mobility  Outcome Measure Help needed turning from your back to your side while in a flat bed without using bedrails?: None Help needed moving from lying on your back to sitting on the side of a flat bed without using bedrails?: A Little Help needed moving to and from a bed to a chair (including a wheelchair)?: A Little Help needed standing up from a chair using your arms (e.g., wheelchair or bedside chair)?: A Lot Help needed to walk in hospital room?: Total Help needed climbing 3-5 steps with a railing? : Total 6 Click Score: 14    End of Session Equipment Utilized During Treatment: Gait belt Activity Tolerance: Patient tolerated treatment well Patient left: in chair;with call bell/phone within reach;with chair alarm set;with nursing/sitter in room;Other (comment)(B prevalon boots in place) Nurse Communication: Mobility status;Precautions;Other (comment);Weight bearing status(pt's pain status) PT Visit Diagnosis: Other abnormalities of gait and mobility (R26.89);Muscle weakness (generalized) (M62.81);History of falling (Z91.81);Difficulty in walking, not elsewhere classified (  R26.2);Pain Pain - Right/Left: Left(Right) Pain - part of body: Ankle and joints of foot    Time: 4715-9539 PT Time Calculation (min) (ACUTE ONLY): 56 min   Charges:   PT Evaluation $PT Eval Low Complexity: 1 Low PT Treatments $Therapeutic Activity: 23-37 mins       Leitha Bleak, PT 07/29/18, 2:59 PM [phone]

## 2018-07-29 NOTE — Care Management Important Message (Signed)
Important Message  Patient Details  Name: Paul [name]. MRN: [id number] Date of Birth: 30-Oct-1965   Medicare Important Message Given:  Yes  The important medicare message from Ajamu Maxon was obtained over a verbal consent and a copy was provided to the patient in the room by the bedside by the RN  [name], RN 07/29/2018, 12:07 PM

## 2018-07-29 NOTE — Progress Notes (Signed)
[facility] NAME: Paul [name]    MR#:  [id number]  DATE OF BIRTH:  1966/03/12  SUBJECTIVE:  CHIEF COMPLAINT:  No chief complaint on file. Patient denies any complaints   REVIEW OF SYSTEMS:  Review of Systems  Constitutional: Negative for chills and fever.  HENT: Negative for ear discharge, hearing loss and nosebleeds.   Eyes: Negative for blurred vision and double vision.  Respiratory: Negative for cough, shortness of breath and wheezing.   Cardiovascular: Positive for leg swelling. Negative for chest pain and palpitations.  Gastrointestinal: Negative for abdominal pain, constipation, diarrhea, nausea and vomiting.  Genitourinary: Negative for dysuria.  Musculoskeletal: Positive for myalgias.  Neurological: Negative for dizziness, focal weakness, seizures, weakness and headaches.  Psychiatric/Behavioral: Negative for depression.    DRUG ALLERGIES:   Allergies  Allergen Reactions  . Ibuprofen Other (See Comments)    Heart problems  . Baclofen Other (See Comments)  . Metformin Diarrhea  . Nsaids     Due to kidney and heart problems per pt    VITALS:  Blood pressure (!) 152/84, pulse 89, temperature 98.1 F (36.7 C), temperature source Oral, resp. rate 18, height 5\' 10"  (1.778 m), weight 94.8 kg, SpO2 100 %.  PHYSICAL EXAMINATION:  Physical Exam  GENERAL:  53 y.o.-year-old patient lying in the bed with no acute distress.  EYES: Pupils equal, round, reactive to light and accommodation. No scleral icterus. Extraocular muscles intact.  HEENT: Head atraumatic, normocephalic. Oropharynx and nasopharynx clear.  NECK:  Supple, no jugular venous distention. No thyroid enlargement, no tenderness.  LUNGS: Normal breath sounds bilaterally, no wheezing, rales,rhonchi or crepitation. No use of accessory muscles of respiration. Decreased bibasilar breath sounds.  CARDIOVASCULAR: S1, S2 normal. No  rubs, or gallops. 2/6 systolic  murmur in place. AICD in place  ABDOMEN: Soft, nontender, nondistended. Bowel sounds present. No organomegaly or mass.  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 Both feet heel ulcers noted as below - No  cyanosis, or clubbing.  Bilateral lower extremity edema NEUROLOGIC: Cranial nerves II through XII are intact. Muscle strength 5/5 in all extremities. Sensation intact. Gait not checked.  PSYCHIATRIC: The patient is alert and oriented x 3.  SKIN: No obvious rash, lesion, or ulcer.     RIGHT HEEL: see above     LEFT HEEL: see above  LABORATORY PANEL:   CBC Recent Labs  Lab 07/28/18 0304  WBC 20.2*  HGB 8.6*  HCT 28.7*  PLT 345   ------------------------------------------------------------------------------------------------------------------  Chemistries  Recent Labs  Lab 07/28/18 0304 07/29/18 0337  NA 135  --   K 3.8  --   CL 98  --   CO2 27  --   GLUCOSE 152*  --   BUN 53*  --   CREATININE 2.61* 3.35*  CALCIUM 8.1*  --    ------------------------------------------------------------------------------------------------------------------  Cardiac Enzymes No results for input(s): TROPONINI in the last 168 hours. ------------------------------------------------------------------------------------------------------------------  RADIOLOGY:  No results found.  EKG:   Orders placed or performed during the hospital encounter of 05/24/18  . ED EKG  . ED EKG  . EKG 12-Lead  . EKG 12-Lead    ASSESSMENT AND PLAN:   53 year old male with past medical history of stroke, carotid stenosis status post left carotid stent, CAD status post CABG,  ischemic cardiomyopathy status post defibrillator, A. fib on Eliquis, diabetes, nonhealing diabetic heel ulcers presents to hospital secondary to worsening right heel ulceration.  1.  Right heel nonhealing ulcer with osteomyelitis-WBC   elevated. - on IV Vanco and Unasyn as per ID recommendations. -Outpatient cultures  growing MRSA and Proteus -ID and podiatry consult appreciated -Urine debridement again today appreciate podiatry input-plan for partial calcanectomy. -CT on admission of the right ankle and calcaneum showing mild new cortical bone loss changes concerning for evolving osteomyelitis. -Status post angiogram with angioplasty -Per ID nephrology needs to see the patient to decide whether a PICC line or Port-A-Cath will be better I have asked nephrology to evaluate    2.  Diabetes mellitus-continue Lantus, sliding scale insulin. -On gabapentin for diabetic neuropathy  3. CKD stage 4: cr at baseline. Monitor, nephrology will be seeing the patient  4.  CAD-stable.  Status post CABG in 2010 and ICD placement.  Recently battery change done last year. -Continue aspirin, Coreg  5.  DVT prophylaxis-teds and SCDs.  Heparin on hold for possible procedures   PT consult after debridement by podiatry   All the records are reviewed and case discussed with Care Management/Social Workerr. Management plans discussed with the patient, family and they are in agreement.  CODE STATUS: Full Code  TOTAL TIME TAKING CARE OF THIS PATIENT: 31 minutes.   POSSIBLE D/C IN 3-4 DAYS, DEPENDING ON CLINICAL CONDITION.   Dustin Flock M.D on 07/29/2018 at 1:22 PM  Between 7am to 6pm - Pager - [phone]  After 6pm go to [email or web address] - password [email or web address] [facility]  Office  [phone]  CC: Primary care physician; Casilda Carls, MD

## 2018-07-29 NOTE — Progress Notes (Signed)
Subjective: Patient is 1 day status post debridement of bilateral heel ulcers.  Left heel needed very minimal debridement and had good granular tissue to it no sign of infection.  Few pockets of necrosis were removed.  On the right heel more extensive debridement was accomplished along with removal of a small posterior portion of the calcaneus.  Necrotic tissue was debrided with a versa jet.  Once good bleeding and removal of the necrotic tissue was accomplished wound vacs were placed on both heels.  These appear to be functioning at present the patient states he is not have any particular problems or difficulties at present.  Pain has been controlled.  Objective: Patient is resting well and is been afebrile.  Wound vacs are intact and in place.  I did order some heel protector boots yesterday but he has not received those as of yet.  I will inquire with nursing staff.  Assessment/plan: Overall I think the patient is doing well.  Wound vacs are functioning and place.  I would like for him to get the heel protector boots to avoid stress on the heels and promote further healing.  I will write an order for physical therapy to build to get him up to move transfer to a chair wheelchair and perhaps a bedside toilet but needs to be only partial weightbearing on the right foot at this timeframe.  He will need a walker to keep most of the weight off the foot at this juncture.  I will write that order today.

## 2018-07-29 NOTE — Consult Note (Addendum)
Pharmacy Antibiotic Note  Paul [name]. is a 53 y.o. male admitted on 07/25/2018 with cellulitis.  Pharmacy has been consulted for Vancomycin dosing. The ID physician has discontinued Zosyn and has started the patient on Unasyn.   Recent outpatient cx growing P. Mirabilis and MRSA  Today, 07/29/2018 Day #4 antibiotics  Scr check this am reveals increasing SCr (? UOP deceasing per documentation but question accuracy)  afebrile   Plan: 1) Vancomycin 1750mg  IV q48hrs. Goal AUC 400-550. Expected AUC: 487 Calculated Css(min): 10.5 SCr used: 3.35 -If SCr continues to increase will need to hold vanco and dose per levels -Torsemide 40mg  PO BID Ordered (not taking at home per med history - ? Noncompliance vs at direction of physician).  3/26 doses documented not given  Patient's BMI is 29.9, therefore used Vd coefficient of 0.72 for dosing purposes.    2) Unasyn 1.5 g IV Q 6hr - If SCr continues to increase 3/28am, then will need to adjust to 3gm q12hd  3.) SCr daily x 3 days  Addendum - d/w Dr Delaine Lame - change vanco to linezolid PO for worsening SCr - adjust ampicillin/sulbactam to 3gm q12h  - monitor renal fx, CBC (platelets with linezolid)   Temp (24hrs), Avg:98.5 F (36.9 C), Min:97.8 F (36.6 C), Max:99.4 F (37.4 C)  Recent Labs  Lab 07/25/18 1750 07/26/18 0439 07/27/18 0316 07/28/18 0304 07/29/18 0337  WBC 21.4* 17.0*  --  20.2*  --   CREATININE 2.70* 2.53* 2.39* 2.61* 3.35*    Estimated Creatinine Clearance: 29.5 mL/min (A) (by C-G formula based on SCr of 3.35 mg/dL (H)).    Allergies  Allergen Reactions  . Ibuprofen Other (See Comments)    Heart problems  . Baclofen Other (See Comments)  . Metformin Diarrhea  . Nsaids     Due to kidney and heart problems per pt    Antimicrobials this admission: Zosyn 3/23 x 1 dose Vancomycin 3/23 >>  Unasyn 3/24 >>  Dose adjustments this admission: None  Microbiology results: 3/23 BCx: NGTD 3/26 R  calcaneus bone: pending  Thank you for allowing pharmacy to be a part of this patient's care.  Doreene Eland, PharmD, BCPS.   Work Cell: [phone] 07/29/2018 7:43 AM

## 2018-07-30 LAB — COMPREHENSIVE METABOLIC PANEL
ALK PHOS: 82 U/L (ref 38–126)
ALT: 11 U/L (ref 0–44)
AST: 13 U/L — ABNORMAL LOW (ref 15–41)
Albumin: 2.2 g/dL — ABNORMAL LOW (ref 3.5–5.0)
Anion gap: 10 (ref 5–15)
BUN: 76 mg/dL — ABNORMAL HIGH (ref 6–20)
CALCIUM: 8.3 mg/dL — AB (ref 8.9–10.3)
CO2: 26 mmol/L (ref 22–32)
Chloride: 99 mmol/L (ref 98–111)
Creatinine, Ser: 3.58 mg/dL — ABNORMAL HIGH (ref 0.61–1.24)
GFR calc Af Amer: 21 mL/min — ABNORMAL LOW (ref >60)
GFR calc non Af Amer: 18 mL/min — ABNORMAL LOW (ref >60)
Glucose, Bld: 157 mg/dL — ABNORMAL HIGH (ref 70–99)
Potassium: 4.7 mmol/L (ref 3.5–5.1)
Sodium: 135 mmol/L (ref 135–145)
Total Bilirubin: 0.4 mg/dL (ref 0.3–1.2)
Total Protein: 6.3 g/dL — ABNORMAL LOW (ref 6.5–8.1)

## 2018-07-30 LAB — CULTURE, BLOOD (ROUTINE X 2)
Culture: NO GROWTH
Culture: NO GROWTH
SPECIAL REQUESTS: ADEQUATE
Special Requests: ADEQUATE

## 2018-07-30 LAB — GLUCOSE, CAPILLARY
Glucose-Capillary: 149 mg/dL — ABNORMAL HIGH (ref 70–99)
Glucose-Capillary: 143 mg/dL — ABNORMAL HIGH (ref 70–99)
Glucose-Capillary: 133 mg/dL — ABNORMAL HIGH (ref 70–99)
Glucose-Capillary: 105 mg/dL — ABNORMAL HIGH (ref 70–99)

## 2018-07-30 LAB — PHOSPHORUS: Phosphorus: 4.2 mg/dL (ref 2.5–4.6)

## 2018-07-30 LAB — CREATININE, SERUM
Creatinine, Ser: 3.57 mg/dL — ABNORMAL HIGH (ref 0.61–1.24)
GFR calc Af Amer: 21 mL/min — ABNORMAL LOW (ref >60)
GFR calc non Af Amer: 18 mL/min — ABNORMAL LOW (ref >60)

## 2018-07-30 MED ORDER — ALLOPURINOL 100 MG PO TABS
100.0000 mg | ORAL_TABLET | Freq: Every day | ORAL | Status: DC
  Administered 2018-07-31 – 2018-08-02 (×3): 100 mg via ORAL
  Filled 2018-07-30 (×3): qty 1

## 2018-07-30 MED ORDER — ALBUMIN HUMAN 25 % IV SOLN
25.0000 g | Freq: Three times a day (TID) | INTRAVENOUS | Status: AC
  Administered 2018-07-30 – 2018-08-02 (×9): 25 g via INTRAVENOUS
  Filled 2018-07-30 (×9): qty 100

## 2018-07-30 MED ORDER — TORSEMIDE 20 MG PO TABS
40.0000 mg | ORAL_TABLET | Freq: Every day | ORAL | Status: DC
  Administered 2018-07-31 – 2018-08-01 (×2): 40 mg via ORAL
  Filled 2018-07-30 (×2): qty 2

## 2018-07-30 NOTE — Progress Notes (Signed)
Blood sugar 105. Pt eating graham crackers and peanut butter and drinking a carton of milk.

## 2018-07-30 NOTE — Progress Notes (Signed)
[facility] Podiatry                                                      Patient Demographics  Paul [name], is a 53 y.o. male   MRN: [id number]   DOB - 03-04-1966  Admit Date - 07/25/2018    Outpatient Primary MD for the patient is Casilda Carls, MD  Consult requested in the Hospital by Epifanio Lesches, MD, On 07/30/2018   With History of -  Past Medical History:  Diagnosis Date  . Anemia   . Cardiac defibrillator in place    a. Biotronik LUmax 540 DRT, (ser # [id number]).  . Carotid arterial disease ([place])    a. s/p prior LICA stenting;  b. 08/2681 Carotid U/S: 40-59% bilat ICA stenosis. Patent LICA stent.  . CHF (congestive heart failure) ([place])   . CKD (chronic kidney disease), stage III ([place])   . Coronary artery disease    a. 2010 s/p CABG x 3.  . DDD (degenerative disc disease), lumbosacral    L5-S1  . Diabetes ([place])    Lantus at bedtime  . Gangrene of toe of right foot ([place])   . Gout of left hand 10/06/2016  . HFrEF (heart failure with reduced ejection fraction) ([place])    a. 07/2016 Echo: EF 25-30%, diff HK, mild MR, mildly dil LA, mod reduced RV fxn, PASP 49mmHg.  Marland Kitchen Hypertension    takes Coreg daily  . Ischemic cardiomyopathy    a. 07/2016 Echo: EF 25-30%, diff HK.  . Myocardial infarction ([place]) 2009  . Peripheral vascular disease ([place])   . Sleep apnea    sleep study yr ago-unable to afford cpap  . Stroke [facility]) 09   no weakness      Past Surgical History:  Procedure Laterality Date  . AMPUTATION TOE Right 08/22/2016   Procedure: AMPUTATION TOE;  Surgeon: Samara Deist, DPM;  Location: [facility] ORS;  Service: Podiatry;  Laterality: Right;  . AMPUTATION TOE Right 10/09/2016   Procedure: AMPUTATION TOE-RIGHT 2ND MPJ;  Surgeon: Samara Deist, DPM;  Location: [facility] ORS;  Service: Podiatry;  Laterality: Right;  . APLIGRAFT PLACEMENT Right 10/09/2016    Procedure: APLIGRAFT PLACEMENT;  Surgeon: Samara Deist, DPM;  Location: [facility] ORS;  Service: Podiatry;  Laterality: Right;  . CALCANEAL OSTEOTOMY Bilateral 07/28/2018   Procedure: RIGHT CALCANECTOMY BILATERAL DEBRIDEMENT OF ULCERS ON HEELS;  Surgeon: Albertine Patricia, DPM;  Location: [facility] ORS;  Service: Podiatry;  Laterality: Bilateral;  . CARDIAC DEFIBRILLATOR PLACEMENT  2011  . CAROTID ENDARTERECTOMY Left   . CHOLECYSTECTOMY  2010  . CORONARY ARTERY BYPASS GRAFT  2010   CABG x 3   . GRAFT APPLICATION Right 08/20/6220   Procedure: FULL THICKNESS SKIN GRAFT-RIGHT FOOT;  Surgeon: Algernon Huxley, MD;  Location: [facility] ORS;  Service: Vascular;  Laterality: Right;  . HIP SURGERY Right 1994  . INCISION AND DRAINAGE OF WOUND Right 08/22/2016   Procedure: IRRIGATION AND DEBRIDEMENT WOUND and wound vac placement;  Surgeon: Samara Deist, DPM;  Location: [facility] ORS;  Service: Podiatry;  Laterality: Right;  . LOWER EXTREMITY ANGIOGRAPHY Right 08/24/2016   Procedure: Lower Extremity Angiography;  Surgeon: Algernon Huxley, MD;  Location: [place] CV LAB;  Service: Cardiovascular;  Laterality: Right;  . LOWER EXTREMITY ANGIOGRAPHY Right 07/27/2018   Procedure: RIGHT Lower Extremity Angiography;  Surgeon:  Algernon Huxley, MD;  Location: [place] CV LAB;  Service: Cardiovascular;  Laterality: Right;  . MASS EXCISION Right 07/18/2014   Procedure: EXCISION HETEROTOPIC BONE RIGHT HIP;  Surgeon: Frederik Pear, MD;  Location: [place];  Service: Orthopedics;  Laterality: Right;  . Open Heart Surgery  2010   x 3  . WOUND DEBRIDEMENT Right 10/09/2016   Procedure: DEBRIDEMENT WOUND;  Surgeon: Samara Deist, DPM;  Location: [facility] ORS;  Service: Podiatry;  Laterality: Right;    in for   No chief complaint on file.    HPI  Paul [name]  is a 53 y.o. male, 2 days status post debridement of ulcerations to the right and left posterior heels.  Right heel had to have some bone removed.  Had antibiotic beads placed against the  wound itself.  Left heel had debridement with application of wound VAC   Social History Social History   Tobacco Use  . Smoking status: Never Smoker  . Smokeless tobacco: Never Used  Substance Use Topics  . Alcohol use: No    Family History Family History  Problem Relation Age of Onset  . Hypertension Other   . Diabetes Other   . Diabetes Father   . Hyperlipidemia Father   . Hypertension Father   . Hyperlipidemia Sister   . Hypertension Sister   . Diabetes Sister   . Diabetes Brother   . Hypertension Brother   . Hyperlipidemia Brother     Prior to Admission medications   Medication Sig Start Date End Date Taking? Authorizing Provider  allopurinol (ZYLOPRIM) 100 MG tablet Take 0.5 tablets (50 mg total) by mouth daily. Patient taking differently: Take 100 mg by mouth daily.  05/30/18  Yes Wieting, [name], MD  aspirin EC 81 MG tablet Take 1 tablet (81 mg total) by mouth daily. 10/21/16  Yes Theora Gianotti, NP  Cholecalciferol (VITAMIN D3) 1.25 MG (50000 UT) CAPS Take 1 capsule by mouth once a week. 07/08/18  Yes [provider]  gabapentin (NEURONTIN) 100 MG capsule Take 200 mg by mouth 3 (three) times daily.  03/03/18  Yes [provider]  acetaminophen (TYLENOL) 325 MG tablet Take 2 tablets (650 mg total) by mouth every 6 (six) hours as needed for mild pain (or Fever >/= 101). 05/29/18   Loletha Grayer, MD  apixaban (ELIQUIS) 5 MG TABS tablet Take 1 tablet (5 mg total) by mouth 2 (two) times daily. Patient not taking: Reported on 07/25/2018 05/29/18   Loletha Grayer, MD  carvedilol (COREG) 3.125 MG tablet Take 1 tablet (3.125 mg total) by mouth 2 (two) times daily. Patient not taking: Reported on 07/27/2018 05/29/18   Loletha Grayer, MD  insulin glargine (LANTUS) 100 unit/mL SOPN Inject 0.2 mLs (20 Units total) into the skin at bedtime. Patient not taking: Reported on 07/27/2018 05/29/18   Loletha Grayer, MD  oxyCODONE 10 MG TABS Take 1 tablet (10  mg total) by mouth every 6 (six) hours as needed for moderate pain. Patient not taking: Reported on 07/27/2018 05/29/18   Loletha Grayer, MD  potassium chloride SA (K-DUR,KLOR-CON) 20 MEQ tablet Take 1 tablet (20 mEq total) by mouth daily. Patient not taking: Reported on 07/25/2018 05/30/18   Loletha Grayer, MD  predniSONE (DELTASONE) 5 MG tablet 3 tabs po daily for three days then 2 tabs po daily for three days then one tab po daily afterwards Patient not taking: Reported on 07/27/2018 05/29/18   Loletha Grayer, MD  torsemide (DEMADEX) 20 MG tablet Take 2  tablets (40 mg total) by mouth 2 (two) times daily. Patient not taking: Reported on 07/27/2018 05/29/18   Loletha Grayer, MD    Anti-infectives (From admission, onward)   Start     Dose/Rate Route Frequency Ordered Stop   07/30/18 1400  vancomycin (VANCOCIN) 1,750 mg in sodium chloride 0.9 % 500 mL IVPB  Status:  Discontinued     1,750 mg 250 mL/hr over 120 Minutes Intravenous Every 48 hours 07/29/18 0737 07/29/18 1156   07/29/18 2200  linezolid (ZYVOX) tablet 600 mg     600 mg Oral Every 12 hours 07/29/18 1156     07/29/18 2200  Ampicillin-Sulbactam (UNASYN) 3 g in sodium chloride 0.9 % 100 mL IVPB     3 g 200 mL/hr over 30 Minutes Intravenous Every 12 hours 07/29/18 1156     07/29/18 0755  ampicillin-sulbactam (UNASYN) 1.5 g in sodium chloride 0.9 % 100 mL IVPB  Status:  Discontinued     1.5 g 200 mL/hr over 30 Minutes Intravenous Every 6 hours 07/29/18 0517 07/29/18 1156   07/28/18 2000  vancomycin (VANCOCIN) 1,000 mg in sodium chloride 0.9 % 250 mL IVPB  Status:  Discontinued     1,000 mg 250 mL/hr over 60 Minutes Intravenous Every 24 hours 07/28/18 1840 07/28/18 1848   07/28/18 2000  vancomycin (VANCOCIN) 1,000 mg in sodium chloride 0.9 % 250 mL IVPB  Status:  Discontinued     1,000 mg 250 mL/hr over 60 Minutes Intravenous Every 24 hours 07/28/18 1848 07/28/18 1850   07/28/18 2000  vancomycin (VANCOCIN) IVPB 1000 mg/200 mL premix   Status:  Discontinued     1,000 mg 200 mL/hr over 60 Minutes Intravenous Every 24 hours 07/28/18 1850 07/29/18 0737   07/27/18 1000  vancomycin (VANCOCIN) 2,250 mg in sodium chloride 0.9 % 500 mL IVPB  Status:  Discontinued     2,250 mg 250 mL/hr over 120 Minutes Intravenous Every 48 hours 07/25/18 1839 07/27/18 0720   07/27/18 0900  vancomycin (VANCOCIN) 1,750 mg in sodium chloride 0.9 % 500 mL IVPB  Status:  Discontinued     1,750 mg 250 mL/hr over 120 Minutes Intravenous Every 36 hours 07/27/18 0720 07/28/18 1840   07/26/18 1845  ampicillin-sulbactam (UNASYN) 1.5 g in sodium chloride 0.9 % 100 mL IVPB  Status:  Discontinued     1.5 g 200 mL/hr over 30 Minutes Intravenous Every 6 hours 07/26/18 1845 07/29/18 0517   07/26/18 0000  piperacillin-tazobactam (ZOSYN) IVPB 3.375 g  Status:  Discontinued     3.375 g 12.5 mL/hr over 240 Minutes Intravenous Every 8 hours 07/25/18 1839 07/26/18 1845   07/25/18 1730  piperacillin-tazobactam (ZOSYN) IVPB 3.375 g     3.375 g 100 mL/hr over 30 Minutes Intravenous  Once 07/25/18 1717 07/25/18 1906   07/25/18 1730  vancomycin (VANCOCIN) 1,000 mg in sodium chloride 0.9 % 250 mL IVPB  Status:  Discontinued     1,000 mg 250 mL/hr over 60 Minutes Intravenous  Once 07/25/18 1717 07/25/18 1720   07/25/18 1730  vancomycin (VANCOCIN) 2,000 mg in sodium chloride 0.9 % 500 mL IVPB     2,000 mg 250 mL/hr over 120 Minutes Intravenous  Once 07/25/18 1720 07/25/18 2139      Scheduled Meds: . allopurinol  50 mg Oral Daily  . aspirin EC  81 mg Oral Daily  . atorvastatin  10 mg Oral q1800  . carvedilol  3.125 mg Oral BID  . clopidogrel  75 mg Oral Daily  .  gabapentin  400 mg Oral TID  . insulin aspart  0-9 Units Subcutaneous TID WC & HS  . insulin glargine  30 Units Subcutaneous QHS  . linezolid  600 mg Oral Q12H  . mupirocin ointment   Nasal BID  . predniSONE  5 mg Oral Q breakfast  . torsemide  40 mg Oral BID   Continuous Infusions: .  ampicillin-sulbactam (UNASYN) IV 3 g (07/30/18 0820)   PRN Meds:.acetaminophen **OR** acetaminophen, oxyCODONE  Allergies  Allergen Reactions  . Ibuprofen Other (See Comments)    Heart problems  . Baclofen Other (See Comments)  . Metformin Diarrhea  . Nsaids     Due to kidney and heart problems per pt    Physical Exam: He is alert and well oriented this morning.  Vitals  Blood pressure (!) 144/72, pulse 75, temperature 97.6 F (36.4 C), temperature source Oral, resp. rate 18, height 5\' 10"  (1.778 m), weight 94.8 kg, SpO2 97 %.  Lower Extremity exam: Wound vacs removed from both areas.  On the right heel there is Mepitel in place holding the beads intact along with about 4 staples.  These have been left in place today.  On the left heel wound vacs removed has excellent granular base to it shows signs of healing to the region.  No evidence of infection to either region.  Data Review  CBC Recent Labs  Lab 07/25/18 1750 07/26/18 0439 07/28/18 0304  WBC 21.4* 17.0* 20.2*  HGB 10.2* 8.6* 8.6*  HCT 33.7* 28.6* 28.7*  PLT 429* 379 345  MCV 82.4 82.7 84.7  MCH 24.9* 24.9* 25.4*  MCHC 30.3 30.1 30.0  RDW 18.4* 18.5* 18.3*   ------------------------------------------------------------------------------------------------------------------  Chemistries  Recent Labs  Lab 07/25/18 1750 07/26/18 0439 07/27/18 0316 07/28/18 0304 07/29/18 0337 07/30/18 0404  NA 135 135 138 135  --   --   K 4.3 3.9 3.6 3.8  --   --   CL 97* 98 98 98  --   --   CO2 27 27 30 27   --   --   GLUCOSE 255* 175* 146* 152*  --   --   BUN 62* 58* 56* 53*  --   --   CREATININE 2.70* 2.53* 2.39* 2.61* 3.35* 3.57*  CALCIUM 8.8* 8.5* 9.1 8.1*  --   --    ------------------------------------------------------------------------------------------------------------------ estimated creatinine clearance is 27.7 mL/min (A) (by C-G formula based on SCr of 3.57 mg/dL  (H)). ------------------------------------------------------------------------------------------------------------------ No results for input(s): TSH, T4TOTAL, T3FREE, THYROIDAB in the last 72 hours.  Invalid input(s): FREET3 Urinalysis    Component Value Date/Time   COLORURINE YELLOW (A) 05/25/2018 0049   APPEARANCEUR CLEAR (A) 05/25/2018 0049   LABSPEC 1.013 05/25/2018 0049   PHURINE 5.0 05/25/2018 0049   GLUCOSEU 50 (A) 05/25/2018 0049   HGBUR NEGATIVE 05/25/2018 0049   BILIRUBINUR NEGATIVE 05/25/2018 0049   KETONESUR NEGATIVE 05/25/2018 0049   PROTEINUR NEGATIVE 05/25/2018 0049   UROBILINOGEN 1.0 07/16/2014 1558   NITRITE NEGATIVE 05/25/2018 0049   LEUKOCYTESUR NEGATIVE 05/25/2018 0049     Imaging results:   US Renal  Result Date: 07/29/2018 CLINICAL DATA:  Acute kidney failure. EXAM: RENAL / URINARY TRACT ULTRASOUND COMPLETE COMPARISON:  None. FINDINGS: Right Kidney: Renal measurements: 10.8 x 6.8 x 4.6 cm = volume: 179 mL . Echogenicity within normal limits. No mass or hydronephrosis visualized. Left Kidney: Renal measurements: 11.7 x 6.2 x 6.1 cm = volume: 232 mL. Echogenicity within normal limits. No mass or hydronephrosis visualized. Bladder:  Appears normal for degree of bladder distention. IMPRESSION: Normal renal ultrasound. Electronically Signed   By: Marijo Conception, M.D.   On: 07/29/2018 14:55    Assessment & Plan: Overall think he stable and going to change the wound VAC on both areas today.  Used a silver granular foam for the sponge application.  He tolerated this well states the pain is less in both heels at this point.  We are waiting try to get him into a rehab facility.  He continue to bear weight fully on the left foot with partial weightbearing on the right foot utilizing a walker.  Does not need to walk around a lot just really transfer to chair or bedside toilet.  Active Problems:   Acute osteomyelitis of right foot ([place])   Osteomyelitis ([place])   Family  Communication: Plan discussed with patient   Albertine Patricia M.D on 07/30/2018 at 9:44 AM  Thank you for the consult, we will follow the patient with you in the Hospital.

## 2018-07-30 NOTE — Progress Notes (Addendum)
[place] [facility] [place] NAME: Paul [name]    MR#:  [id number]  DATE OF BIRTH:  [birth year]  SUBJECTIVE:  CHIEF COMPLAINT:  No chief complaint on file. Patient is feeling better .  REVIEW OF SYSTEMS:  Review of Systems  Constitutional: Negative for chills and fever.  HENT: Negative for ear discharge, hearing loss and nosebleeds.   Eyes: Negative for blurred vision and double vision.  Respiratory: Negative for cough, shortness of breath and wheezing.   Cardiovascular: Positive for leg swelling. Negative for chest pain and palpitations.  Gastrointestinal: Negative for abdominal pain, constipation, diarrhea, nausea and vomiting.  Genitourinary: Negative for dysuria.  Musculoskeletal: Positive for myalgias.  Neurological: Negative for dizziness, focal weakness, seizures, weakness and headaches.  Psychiatric/Behavioral: Negative for depression.    DRUG ALLERGIES:   Allergies  Allergen Reactions  . Ibuprofen Other (See Comments)    Heart problems  . Baclofen Other (See Comments)  . Metformin Diarrhea  . Nsaids     Due to kidney and heart problems per pt    VITALS:  Blood pressure (!) 126/98, pulse 82, temperature 97.6 F (36.4 C), temperature source Oral, resp. rate 18, height 5\' 10"  (1.778 m), weight 94.8 kg, SpO2 99 %.  PHYSICAL EXAMINATION:  Physical Exam  GENERAL:  53 y.o.-year-old patient lying in the bed with no acute distress.  EYES: Pupils equal, round, reactive to light and accommodation. No scleral icterus. Extraocular muscles intact.  HEENT: Head atraumatic, normocephalic. Oropharynx and nasopharynx clear.  NECK:  Supple, no jugular venous distention. No thyroid enlargement, no tenderness.  LUNGS: Normal breath sounds bilaterally, no wheezing, rales,rhonchi or crepitation. No use of accessory muscles of respiration. Decreased bibasilar breath sounds.  CARDIOVASCULAR: S1, S2 normal. No  rubs, or gallops. 2/6 systolic murmur  in place. AICD in place  ABDOMEN: Soft, nontender, nondistended. Bowel sounds present. No organomegaly or mass.  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 Both feet heel ulcers noted as below - No  cyanosis, or clubbing.  Bilateral lower extremity edema NEUROLOGIC: Cranial nerves II through XII are intact. Muscle strength 5/5 in all extremities. Sensation intact. Gait not checked.  PSYCHIATRIC: The patient is alert and oriented x 3.  SKIN: No obvious rash, lesion, or ulcer.     RIGHT HEEL: see above     LEFT HEEL: see above  LABORATORY PANEL:   CBC Recent Labs  Lab 07/28/18 0304  WBC 20.2*  HGB 8.6*  HCT 28.7*  PLT 345   ------------------------------------------------------------------------------------------------------------------  Chemistries  Recent Labs  Lab 07/30/18 0404  NA 135  K 4.7  CL 99  CO2 26  GLUCOSE 157*  BUN 76*  CREATININE 3.58*  3.57*  CALCIUM 8.3*  AST 13*  ALT 11  ALKPHOS 82  BILITOT 0.4   ------------------------------------------------------------------------------------------------------------------  Cardiac Enzymes No results for input(s): TROPONINI in the last 168 hours. ------------------------------------------------------------------------------------------------------------------  RADIOLOGY:  US Renal  Result Date: 07/29/2018 CLINICAL DATA:  Acute kidney failure. EXAM: RENAL / URINARY TRACT ULTRASOUND COMPLETE COMPARISON:  None. FINDINGS: Right Kidney: Renal measurements: 10.8 x 6.8 x 4.6 cm = volume: 179 mL . Echogenicity within normal limits. No mass or hydronephrosis visualized. Left Kidney: Renal measurements: 11.7 x 6.2 x 6.1 cm = volume: 232 mL. Echogenicity within normal limits. No mass or hydronephrosis visualized. Bladder: Appears normal for degree of bladder distention. IMPRESSION: Normal renal ultrasound. Electronically Signed   By: Marijo Conception, M.D.   On: 07/29/2018 14:55  EKG:   Orders placed or  performed during the hospital encounter of 05/24/18  . ED EKG  . ED EKG  . EKG 12-Lead  . EKG 12-Lead    ASSESSMENT AND PLAN:   53 year old male with past medical history of stroke, carotid stenosis status post left carotid stent, CAD status post CABG,  ischemic cardiomyopathy status post defibrillator, A. fib on Eliquis, diabetes, nonhealing diabetic heel ulcers presents to hospital secondary to worsening right heel ulceration.  1.  Right heel nonhealing ulcer with osteomyelitis-seen by podiatry today, wound VAC changed today, waiting for nursing home placement, -Outpatient cultures growing MRSA and Proteus -ID and podiatry consult appreciated - debridement again  BY  podiatry input-plan for partial calcanectomy. -CT on admission of the right ankle and calcaneum showing mild new cortical bone loss changes concerning for evolving osteomyelitis. -Status post angiogram with angioplasty -Per ID nephrology needs to see the patient to decide whether a PICC line or Port-A-Cath will be better I have asked nephrology to evaluate  Continue Unasyn, Zyvox.  2.  Diabetes mellitus-continue Lantus, sliding scale insulin.  Blood sugar well controlled. -On gabapentin for diabetic neuropathy  3.  Acute renal failure on CKD stage 4: cr at baseline.  Not currently on ACE inhibitor's, renal stone did not show any acute obstruction, patient renal function is worsening, creatinine is 3.58 today.  It was 3.35 yesterday, 2.692 days ago.  ICU 4.  CAD-stable.  Status post CABG in 2010 and ICD placement.  Recently battery change done last year.  -Continue aspirin, Coreg  5.  DVT prophylaxis-Heparin  Physical therapy recommends SNF placement. All the records are reviewed and case discussed with Care Management/Social Workerr. Management plans discussed with the patient, family and they are in agreement.  CODE STATUS: Full Code  TOTAL TIME TAKING CARE OF THIS PATIENT: 35 minutes.   POSSIBLE D/C IN 3-4  DAYS, DEPENDING ON CLINICAL CONDITION.  More than 50% time spent in counseling, coordination of care. Epifanio Lesches M.D on 07/30/2018 at 10:50 AM  Between 7am to 6pm - Pager - [phone]  After 6pm go to [email or web address] - password [email or web address] [place] [facility]  Office  [phone]  CC: Primary care physician; Casilda Carls, MD

## 2018-07-30 NOTE — Progress Notes (Signed)
[facility]  ROUNDING NOTE   Subjective:   UOP 1150  Creatinine 3.57 (3.35)  Objective:  Vital signs in last 24 hours:  Temp:  [97.6 F (36.4 C)-97.9 F (36.6 C)] 97.6 F (36.4 C) (03/28 0620) Pulse Rate:  [75-82] 82 (03/28 1003) Resp:  [16-18] 18 (03/28 0620) BP: (126-145)/(72-98) 126/98 (03/28 1003) SpO2:  [93 %-100 %] 99 % (03/28 1003)  Weight change:  Filed Weights   07/25/18 1800  Weight: 94.8 kg    Intake/Output: I/O last 3 completed shifts: In: 1448.2 [P.O.:640; IV Piggyback:808.2] Out: 1900 [Urine:1850; Drains:50]   Intake/Output this shift:  Total I/O In: -  Out: 200 [Urine:200]  Physical Exam: General: NAD,   Head: Normocephalic, atraumatic. Moist oral mucosal membranes  Eyes: Anicteric, PERRL  Neck: Supple, trachea midline  Lungs:  Clear to auscultation  Heart: Regular rate and rhythm  Abdomen:  Soft, nontender,   Extremities:  right foot in wound vac, + edema  Neurologic: Nonfocal, moving all four extremities  Skin: No lesions        Basic Metabolic Panel: Recent Labs  Lab 07/25/18 1750 07/26/18 0439 07/27/18 0316 07/28/18 0304 07/29/18 0337 07/30/18 0404  NA 135 135 138 135  --  135  K 4.3 3.9 3.6 3.8  --  4.7  CL 97* 98 98 98  --  99  CO2 27 27 30 27   --  26  GLUCOSE 255* 175* 146* 152*  --  157*  BUN 62* 58* 56* 53*  --  76*  CREATININE 2.70* 2.53* 2.39* 2.61* 3.35* 3.58*  3.57*  CALCIUM 8.8* 8.5* 9.1 8.1*  --  8.3*  PHOS  --   --   --   --   --  4.2    Liver Function Tests: Recent Labs  Lab 07/30/18 0404  AST 13*  ALT 11  ALKPHOS 82  BILITOT 0.4  PROT 6.3*  ALBUMIN 2.2*   No results for input(s): LIPASE, AMYLASE in the last 168 hours. No results for input(s): AMMONIA in the last 168 hours.  CBC: Recent Labs  Lab 07/25/18 1750 07/26/18 0439 07/28/18 0304  WBC 21.4* 17.0* 20.2*  HGB 10.2* 8.6* 8.6*  HCT 33.7* 28.6* 28.7*  MCV 82.4 82.7 84.7  PLT 429* 379 345    Cardiac Enzymes: No results  for input(s): CKTOTAL, CKMB, CKMBINDEX, TROPONINI in the last 168 hours.  BNP: Invalid input(s): POCBNP  CBG: Recent Labs  Lab 07/29/18 0747 07/29/18 1139 07/29/18 1646 07/29/18 2131 07/30/18 0800  GLUCAP 390* 308* 218* 212* 149*    Microbiology: Results for orders placed or performed during the hospital encounter of 07/25/18  Culture, blood (routine x 2)     Status: None   Collection Time: 07/25/18  5:58 PM  Result Value Ref Range Status   Specimen Description BLOOD LEFT ANTECUBITAL  Final   Special Requests   Final    BOTTLES DRAWN AEROBIC AND ANAEROBIC Blood Culture adequate volume   Culture   Final    NO GROWTH 5 DAYS Performed at [facility], [address] [place], [place] [address]    Report Status 07/30/2018 FINAL  Final  Culture, blood (routine x 2)     Status: None   Collection Time: 07/25/18  6:11 PM  Result Value Ref Range Status   Specimen Description BLOOD BLOOD RIGHT FOREARM  Final   Special Requests   Final    BOTTLES DRAWN AEROBIC AND ANAEROBIC Blood Culture adequate volume   Culture  Final    NO GROWTH 5 DAYS Performed at [facility], [address] [place], [place] [address]    Report Status 07/30/2018 FINAL  Final  MRSA PCR Screening     Status: Abnormal   Collection Time: 07/27/18  8:32 AM  Result Value Ref Range Status   MRSA by PCR POSITIVE (A) NEGATIVE Final    Comment:        The GeneXpert MRSA Assay (FDA approved for NASAL specimens only), is one component of a comprehensive MRSA colonization surveillance program. It is not intended to diagnose MRSA infection nor to guide or monitor treatment for MRSA infections. RESULT CALLED TO, READ BACK BY AND VERIFIED WITH:  JENNIFER COLES AT 1005 07/27/18 SDR Performed at [facility], [address] [place], [place] [address]   Aerobic/Anaerobic Culture (surgical/deep wound)     Status: None (Preliminary result)   Collection Time: 07/28/18  8:49 AM  Result  Value Ref Range Status   Specimen Description   Final    BONE RIGHT CALCANEUS Performed at [facility], [place] [address], [place], [place] [address]    Special Requests   Final    NONE Performed at [facility], [address] [place], [place] [address]    Gram Stain   Final    RARE WBC PRESENT,BOTH PMN AND MONONUCLEAR NO ORGANISMS SEEN    Culture   Final    CULTURE REINCUBATED FOR BETTER GROWTH Performed at [facility], [place] [address], [place], [place] [address]    Report Status PENDING  Incomplete    Coagulation Studies: No results for input(s): LABPROT, INR in the last 72 hours.  Urinalysis: No results for input(s): COLORURINE, LABSPEC, PHURINE, GLUCOSEU, HGBUR, BILIRUBINUR, KETONESUR, PROTEINUR, UROBILINOGEN, NITRITE, LEUKOCYTESUR in the last 72 hours.  Invalid input(s): APPERANCEUR    Imaging: US Renal  Result Date: 07/29/2018 CLINICAL DATA:  Acute kidney failure. EXAM: RENAL / URINARY TRACT ULTRASOUND COMPLETE COMPARISON:  None. FINDINGS: Right Kidney: Renal measurements: 10.8 x 6.8 x 4.6 cm = volume: 179 mL . Echogenicity within normal limits. No mass or hydronephrosis visualized. Left Kidney: Renal measurements: 11.7 x 6.2 x 6.1 cm = volume: 232 mL. Echogenicity within normal limits. No mass or hydronephrosis visualized. Bladder: Appears normal for degree of bladder distention. IMPRESSION: Normal renal ultrasound. Electronically Signed   By: Marijo Conception, M.D.   On: 07/29/2018 14:55     Medications:   . albumin human    . ampicillin-sulbactam (UNASYN) IV 3 g (07/30/18 0820)   . [START ON 07/31/2018] allopurinol  100 mg Oral Daily  . aspirin EC  81 mg Oral Daily  . atorvastatin  10 mg Oral q1800  . carvedilol  3.125 mg Oral BID  . clopidogrel  75 mg Oral Daily  . gabapentin  400 mg Oral TID  . insulin aspart  0-9 Units Subcutaneous TID WC & HS  . insulin glargine  30 Units Subcutaneous QHS  . linezolid  600 mg Oral Q12H  . mupirocin  ointment   Nasal BID  . predniSONE  5 mg Oral Q breakfast  . [START ON 07/31/2018] torsemide  40 mg Oral Daily   acetaminophen **OR** acetaminophen, oxyCODONE  Assessment/ Plan:  [name] is a 53 y.o. black male  [name] is a 53 y.o. black male with diabetes mellitus type II, hypertension,coronary artery disease, congestive heart failure, ICD, carotid stenosis, CVA, obstructive sleep apnea, peripheral  vascular disease, gout, who was admitted to [facility] on 07/25/2018 for Osteomyelitis rt Heel  1. Acute renal failure on chronic kidney disease stage IV: Creatinine 2.67, GFR of 30 on 05/29/18.  Chronic kidney disease secondary to diabetic nephropathy - not currently on an ACE-I/ARB - Ultrasound reviewed with patient - Discussed chronic kidney disease and dialysis.   2. Right heel osteomyelitis: if patient will need IV antibiotics. With leukocytosis. Recommend central line (Powerline)  2. Hypertension:   - carvedilol and torsemide.   3. Anemia: Hemoglobin 8.6. Normocytic.   4. Diabetes mellitus type II with chronic kidney disease: insulin dependent. Hemoglobin A1c of 7.9%.  Complication of diabetic foot ulcer with osteomyelitis.    LOS: 5 Aracelia Brinson 3/28/202011:22 AM

## 2018-07-31 LAB — CBC WITH DIFFERENTIAL/PLATELET
Abs Immature Granulocytes: 0.1 10*3/uL — ABNORMAL HIGH (ref 0.00–0.07)
Basophils Absolute: 0 10*3/uL (ref 0.0–0.1)
Basophils Relative: 0 %
Eosinophils Absolute: 0.1 10*3/uL (ref 0.0–0.5)
Eosinophils Relative: 1 %
HCT: 25.2 % — ABNORMAL LOW (ref 39.0–52.0)
Hemoglobin: 7.6 g/dL — ABNORMAL LOW (ref 13.0–17.0)
Immature Granulocytes: 1 %
Lymphocytes Relative: 10 %
Lymphs Abs: 1.5 10*3/uL (ref 0.7–4.0)
MCH: 25.3 pg — ABNORMAL LOW (ref 26.0–34.0)
MCHC: 30.2 g/dL (ref 30.0–36.0)
MCV: 84 fL (ref 80.0–100.0)
Monocytes Absolute: 0.9 10*3/uL (ref 0.1–1.0)
Monocytes Relative: 6 %
NEUTROS ABS: 12.6 10*3/uL — AB (ref 1.7–7.7)
Neutrophils Relative %: 82 %
Platelets: 345 10*3/uL (ref 150–400)
RBC: 3 MIL/uL — ABNORMAL LOW (ref 4.22–5.81)
RDW: 18.2 % — ABNORMAL HIGH (ref 11.5–15.5)
WBC: 15.2 10*3/uL — ABNORMAL HIGH (ref 4.0–10.5)
nRBC: 0 % (ref 0.0–0.2)

## 2018-07-31 LAB — GLUCOSE, CAPILLARY
GLUCOSE-CAPILLARY: 92 mg/dL (ref 70–99)
GLUCOSE-CAPILLARY: 140 mg/dL — AB (ref 70–99)
Glucose-Capillary: 152 mg/dL — ABNORMAL HIGH (ref 70–99)
Glucose-Capillary: 125 mg/dL — ABNORMAL HIGH (ref 70–99)

## 2018-07-31 LAB — RENAL FUNCTION PANEL
Albumin: 2.8 g/dL — ABNORMAL LOW (ref 3.5–5.0)
Anion gap: 6 (ref 5–15)
BUN: 82 mg/dL — AB (ref 6–20)
CO2: 27 mmol/L (ref 22–32)
Calcium: 8 mg/dL — ABNORMAL LOW (ref 8.9–10.3)
Chloride: 102 mmol/L (ref 98–111)
Creatinine, Ser: 3.67 mg/dL — ABNORMAL HIGH (ref 0.61–1.24)
GFR calc Af Amer: 21 mL/min — ABNORMAL LOW (ref >60)
GFR calc non Af Amer: 18 mL/min — ABNORMAL LOW (ref >60)
Glucose, Bld: 126 mg/dL — ABNORMAL HIGH (ref 70–99)
POTASSIUM: 4.8 mmol/L (ref 3.5–5.1)
Phosphorus: 4.5 mg/dL (ref 2.5–4.6)
Sodium: 135 mmol/L (ref 135–145)

## 2018-07-31 NOTE — Progress Notes (Signed)
Physical Therapy Treatment Patient Details Name: Paul [name]. MRN: [id number] DOB: 1965/09/11 Today's Date: 07/31/2018    History of Present Illness Pt is a 53 y.o. male presenting to hospital 07/25/18 with chronic ulcerations B heels (likely osteomyelitis Rt heel).  S/p angio 07/27/18.  S/p debridement B heel ulcers 07/28/18 (R heel more extensive debridement with removal of small posterior portion of calcaneous).  PMH includes cervical radiculopathy, neuropathy, sleep apnea, anemia, cardiac defibrillator, CHF, CKD, CAD s/p CABG x3, DM, gout, htn, MI, stroke, R transmetatarsal amputation, h/o R hip surgery.    PT Comments    Pt received in bed, prevalon boots donned. Pt agreeable to participate. Educated on HEP for RLE atrophy avoidance, and postural strengthening for RW use. Pt responds well to minimal verbal and visual cues for form. Pt is enthusiastic in maintaining strength. Pt has dizziness upon moving to EOB initially, but none thereafter, including with standing. Pt demonstrates excellent performance of recommendations for weight bearing during session. Pt progressing well overall.    Follow Up Recommendations  SNF     Equipment Recommendations  Rolling walker with 5" wheels;Wheelchair (measurements PT);Wheelchair cushion (measurements PT);3in1 (PT)    Recommendations for Other Services       Precautions / Restrictions Precautions Precautions: Fall Restrictions Weight Bearing Restrictions: Yes RLE Weight Bearing: Partial weight bearing('FFWB when on floor') RLE Partial Weight Bearing Percentage or Pounds: "Only 50% or less weight on the right foot with a walker."; flat foot weight bearing when down.  LLE Weight Bearing: ("full weight bearing") Other Position/Activity Restrictions: "Transfer to bedside chair and bedside toilet.  Can bear weight on the left foot on the right foot really only 50% or less weight on the right foot with a walker."    Mobility  Bed  Mobility Overal bed mobility: Needs Assistance Bed Mobility: Supine to Sit     Supine to sit: Modified independent (Device/Increase time)     General bed mobility comments: supervision for lines  Transfers Overall transfer level: Needs assistance Equipment used: Rolling walker (2 wheeled) Transfers: Sit to/from Stand Sit to Stand: Supervision         General transfer comment: demonstration and VC for Rt NWB transfer up to avoid excessive DF  Ambulation/Gait             General Gait Details: Deferred (orders for transfers only)   Stairs             Wheelchair Mobility    Modified Rankin (Stroke Patients Only)       Balance Overall balance assessment: No apparent balance deficits (not formally assessed)                                          Cognition Arousal/Alertness: Awake/alert Behavior During Therapy: WFL for tasks assessed/performed Overall Cognitive Status: Within Functional Limits for tasks assessed                                        Exercises Other Exercises Other Exercises: Standing Rt hip abduction: 1x10x3secH; SLR 1x10 bilat  Other Exercises: seated LAQ 1x10x3secH  Other Exercises: seated marching core stabilization 1x10 bilat  Other Exercises: seated scapular retraction 10x3secH  Other Exercises: seated arm chair push-ups 1x15    General Comments  Pertinent Vitals/Pain Pain Assessment: Faces Faces Pain Scale: Hurts little more Pain Location: Lt foot/heel Pain Descriptors / Indicators: Tender;Sore Pain Intervention(s): Limited activity within patient's tolerance;Monitored during session;Premedicated before session    Home Living                      Prior Function            PT Goals (current goals can now be found in the care plan section) Acute Rehab PT Goals Patient Stated Goal: to go home PT Goal Formulation: With patient Time For Goal Achievement:  08/12/18 Potential to Achieve Goals: Fair Progress towards PT goals: Progressing toward goals    Frequency    Min 2X/week      PT Plan Current plan remains appropriate    Co-evaluation              AM-PAC PT "6 Clicks" Mobility   Outcome Measure  Help needed turning from your back to your side while in a flat bed without using bedrails?: None Help needed moving from lying on your back to sitting on the side of a flat bed without using bedrails?: A Little Help needed moving to and from a bed to a chair (including a wheelchair)?: A Little Help needed standing up from a chair using your arms (e.g., wheelchair or bedside chair)?: A Little Help needed to walk in hospital room?: A Little Help needed climbing 3-5 steps with a railing? : A Little 6 Click Score: 19    End of Session   Activity Tolerance: Patient tolerated treatment well;No increased pain Patient left: in chair;with call bell/phone within reach;with chair alarm set;with nursing/sitter in room Nurse Communication: Mobility status PT Visit Diagnosis: Other abnormalities of gait and mobility (R26.89);Muscle weakness (generalized) (M62.81);History of falling (Z91.81);Difficulty in walking, not elsewhere classified (R26.2);Pain Pain - Right/Left: (bilateral) Pain - part of body: Ankle and joints of foot     Time: 1411-1445 PT Time Calculation (min) (ACUTE ONLY): 34 min  Charges:  $Therapeutic Exercise: 23-37 mins                     2:57 PM, 07/31/18 Etta Grandchild, PT, DPT Physical Therapist - [facility]  [phone] ([place])     Paul [name] C 07/31/2018, 2:53 PM

## 2018-07-31 NOTE — Progress Notes (Signed)
Subjective: Patient states he is doing well no pain no discomfort.  He states he is able to transfer you put a little bit of weight on the right foot when he does.  Full weight on the left foot is okay.  Objective: Exam shows the wound VAC is functional in good position.  He has the heel protector boots on.  Patient is in no distress or pain at present.  Assessment/plan: Overall I think he stable is waiting to get him transferred hopefully to skilled nursing but he may end up having to go home because of lack of acceptance.  It is okay for him to put some weight on the right foot but I told him not to put weight just on the toe but put it on the foot flat whenever he does it.  He can use the left foot for full weightbearing.  He should always have a walker when he tries to bear weight.  Wound vacs were changed yesterday and I will see him again tomorrow morning and see if we can get his situation established.  I do want to continue with IV antibiotics for at least 4 weeks.

## 2018-07-31 NOTE — Progress Notes (Signed)
Pt was served breakfast tray and requested 5 packs of granulated sugar stating that the Attending Physician had authorized this on 07/30/2018. Education was reinforced regarding consuming sugar given his medical history. Pt was not receptive to the education. Pt was given 3 packs of sugar per Dr Governor Specking verbal order.

## 2018-07-31 NOTE — Plan of Care (Signed)

## 2018-07-31 NOTE — Progress Notes (Signed)
[place] [facility] [place] NAME: Paul [name]    MR#:  [id number]  DATE OF BIRTH:  May 30, 1965  SUBJECTIVE: Denies any complaints.  Main complaint is having extra sugar with breakfast patient waking to have at least 3 sugars with his oatmeal, yesterday he was rude to the staff because he did not get regular sugar due to his diabetes as per my discussion with nurses.  CHIEF COMPLAINT:  No chief complaint on file. Patient is feeling better .  REVIEW OF SYSTEMS:  Review of Systems  Constitutional: Negative for chills and fever.  HENT: Negative for ear discharge, hearing loss and nosebleeds.   Eyes: Negative for blurred vision and double vision.  Respiratory: Negative for cough, shortness of breath and wheezing.   Cardiovascular: Positive for leg swelling. Negative for chest pain and palpitations.  Gastrointestinal: Negative for abdominal pain, constipation, diarrhea, nausea and vomiting.  Genitourinary: Negative for dysuria.  Musculoskeletal: Positive for myalgias.  Neurological: Negative for dizziness, focal weakness, seizures, weakness and headaches.  Psychiatric/Behavioral: Negative for depression.  Skin exam shows wound VAC is functional in good position, patient has right foot dressing present.  DRUG ALLERGIES:   Allergies  Allergen Reactions  . Ibuprofen Other (See Comments)    Heart problems  . Baclofen Other (See Comments)  . Metformin Diarrhea  . Nsaids     Due to kidney and heart problems per pt    VITALS:  Blood pressure 125/65, pulse 85, temperature 97.9 F (36.6 C), temperature source Oral, resp. rate 18, height 5\' 10"  (1.778 m), weight 94.8 kg, SpO2 100 %.  PHYSICAL EXAMINATION:  Physical Exam  GENERAL:  53 y.o.-year-old patient lying in the bed with no acute distress.  EYES: Pupils equal, round, reactive to light and accommodation. No scleral icterus. Extraocular muscles intact.  HEENT: Head atraumatic, normocephalic.  Oropharynx and nasopharynx clear.  NECK:  Supple, no jugular venous distention. No thyroid enlargement, no tenderness.  LUNGS: Normal breath sounds bilaterally, no wheezing, rales,rhonchi or crepitation. No use of accessory muscles of respiration. Decreased bibasilar breath sounds.  CARDIOVASCULAR: S1, S2 normal. No  rubs, or gallops. 2/6 systolic murmur in place. AICD in place  ABDOMEN: Soft, nontender, nondistended. Bowel sounds present. No organomegaly or mass.  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 Both feet heel ulcers noted as below - No  cyanosis, or clubbing.  Bilateral lower extremity edema NEUROLOGIC: Cranial nerves II through XII are intact. Muscle strength 5/5 in all extremities. Sensation intact. Gait not checked.  PSYCHIATRIC: The patient is alert and oriented x 3.  SKIN: Has right foot dressing present.  Wound VAC in place.  Appears to be healing well.   RIGHT HEEL: see above     LEFT HEEL: see above  LABORATORY PANEL:   CBC Recent Labs  Lab 07/31/18 0410  WBC 15.2*  HGB 7.6*  HCT 25.2*  PLT 345   ------------------------------------------------------------------------------------------------------------------  Chemistries  Recent Labs  Lab 07/30/18 0404 07/31/18 0410  NA 135 135  K 4.7 4.8  CL 99 102  CO2 26 27  GLUCOSE 157* 126*  BUN 76* 82*  CREATININE 3.58*  3.57* 3.67*  CALCIUM 8.3* 8.0*  AST 13*  --   ALT 11  --   ALKPHOS 82  --   BILITOT 0.4  --    ------------------------------------------------------------------------------------------------------------------  Cardiac Enzymes No results for input(s): TROPONINI in the last 168 hours. ------------------------------------------------------------------------------------------------------------------  RADIOLOGY:  US Renal  Result Date: 07/29/2018  CLINICAL DATA:  Acute kidney failure. EXAM: RENAL / URINARY TRACT ULTRASOUND COMPLETE COMPARISON:  None. FINDINGS: Right Kidney:  Renal measurements: 10.8 x 6.8 x 4.6 cm = volume: 179 mL . Echogenicity within normal limits. No mass or hydronephrosis visualized. Left Kidney: Renal measurements: 11.7 x 6.2 x 6.1 cm = volume: 232 mL. Echogenicity within normal limits. No mass or hydronephrosis visualized. Bladder: Appears normal for degree of bladder distention. IMPRESSION: Normal renal ultrasound. Electronically Signed   By: Marijo Conception, M.D.   On: 07/29/2018 14:55    EKG:   Orders placed or performed during the hospital encounter of 05/24/18  . ED EKG  . ED EKG  . EKG 12-Lead  . EKG 12-Lead    ASSESSMENT AND PLAN:   53 year old male with past medical history of stroke, carotid stenosis status post left carotid stent, CAD status post CABG,  ischemic cardiomyopathy status post defibrillator, A. fib on Eliquis, diabetes, nonhealing diabetic heel ulcers presents to hospital secondary to worsening right heel ulceration.  1.  Right heel nonhealing ulcer with osteomyelitis-seen by podiatry today, continue wound VAC, IV antibiotics for 4 weeks, patient has a PICC line.  Patient wants to go to [facility]. -Outpatient cultures growing MRSA and Proteus -ID and podiatry consult appreciated - debridement again  BY  podiatry input-plan for partial calcanectomy. -CT on admission of the right ankle and calcaneum showing mild new cortical bone loss changes concerning for evolving osteomyelitis. -Status post angiogram with angioplasty -Continue Unasyn, Zyvox.  2.  Diabetes mellitus-continue Lantus, sliding scale insulin.  Blood sugar well controlled.  Patient has evidence of CAD, CABG, CKD stage IV, with nonhealing ulcer of the right but he still wants to have refined sugars with his oatmeal and he is very adamant and also rude to the staff.  Explained to him multiple times and he still wants to have regular sugars with his oatmeal only and does not want sugar rest of the times. -On gabapentin for diabetic neuropathy   3.  Acute renal failure on CKD stage 4: cr at baseline.  Not currently on ACE inhibitor's, renal ultrasound did not show any acute obstruction, seen by nephrology, monitor kidney function closely, creatinine 3.67 today started on torsemide 40 mg daily today.  4.  CAD-stable.  Status post CABG in 2010 and ICD placement.  Recently battery change done last year.  -Continue aspirin, Coreg  5.  DVT prophylaxis-Heparin  Physical therapy recommends SNF placement.  Patient wants to go to [facility]. All the records are reviewed and case discussed with Care Management/Social Workerr. Management plans discussed with the patient, family and they are in agreement.  CODE STATUS: Full Code  TOTAL TIME TAKING CARE OF THIS PATIENT: 35 minutes.   POSSIBLE D/C IN 3-4 DAYS, DEPENDING ON CLINICAL CONDITION.  More than 50% time spent in counseling, coordination of care. Epifanio Lesches M.D on 07/31/2018 at 12:18 PM  Between 7am to 6pm - Pager - [phone]  After 6pm go to [email or web address] - password [email or web address] [facility]  Office  [phone]  CC: Primary care physician; Casilda Carls, MD

## 2018-07-31 NOTE — Progress Notes (Signed)
FBS 125

## 2018-07-31 NOTE — Progress Notes (Signed)
[facility]  ROUNDING NOTE   Subjective:   UOP 2060  Creatinine 3.67 (3.58)  Objective:  Vital signs in last 24 hours:  Temp:  [97.8 F (36.6 C)-98.5 F (36.9 C)] 97.9 F (36.6 C) (03/29 1003) Pulse Rate:  [74-85] 85 (03/29 1003) Resp:  [18] 18 (03/28 2158) BP: (103-141)/(54-88) 125/65 (03/29 1003) SpO2:  [93 %-100 %] 100 % (03/29 1003)  Weight change:  Filed Weights   07/25/18 1800  Weight: 94.8 kg    Intake/Output: I/O last 3 completed shifts: In: 66 [P.O.:480; IV Piggyback:400] Out: 2685 [Urine:2660; Drains:25]   Intake/Output this shift:  Total I/O In: -  Out: 500 [Urine:500]  Physical Exam: General: NAD,   Head: Normocephalic, atraumatic. Moist oral mucosal membranes  Eyes: Anicteric, PERRL  Neck: Supple, trachea midline  Lungs:  Clear to auscultation  Heart: Regular rate and rhythm  Abdomen:  Soft, nontender,   Extremities:  bilateral lower extremity wound vacs, + edema, pressure boots  Neurologic: Nonfocal, moving all four extremities  Skin: No lesions        Basic Metabolic Panel: Recent Labs  Lab 07/26/18 0439 07/27/18 0316 07/28/18 0304 07/29/18 0337 07/30/18 0404 07/31/18 0410  NA 135 138 135  --  135 135  K 3.9 3.6 3.8  --  4.7 4.8  CL 98 98 98  --  99 102  CO2 27 30 27   --  26 27  GLUCOSE 175* 146* 152*  --  157* 126*  BUN 58* 56* 53*  --  76* 82*  CREATININE 2.53* 2.39* 2.61* 3.35* 3.58*  3.57* 3.67*  CALCIUM 8.5* 9.1 8.1*  --  8.3* 8.0*  PHOS  --   --   --   --  4.2 4.5    Liver Function Tests: Recent Labs  Lab 07/30/18 0404 07/31/18 0410  AST 13*  --   ALT 11  --   ALKPHOS 82  --   BILITOT 0.4  --   PROT 6.3*  --   ALBUMIN 2.2* 2.8*   No results for input(s): LIPASE, AMYLASE in the last 168 hours. No results for input(s): AMMONIA in the last 168 hours.  CBC: Recent Labs  Lab 07/25/18 1750 07/26/18 0439 07/28/18 0304 07/31/18 0410  WBC 21.4* 17.0* 20.2* 15.2*  NEUTROABS  --   --   --  12.6*   HGB 10.2* 8.6* 8.6* 7.6*  HCT 33.7* 28.6* 28.7* 25.2*  MCV 82.4 82.7 84.7 84.0  PLT 429* 379 345 345    Cardiac Enzymes: No results for input(s): CKTOTAL, CKMB, CKMBINDEX, TROPONINI in the last 168 hours.  BNP: Invalid input(s): POCBNP  CBG: Recent Labs  Lab 07/30/18 1211 07/30/18 1650 07/30/18 2106 07/31/18 0728 07/31/18 1146  GLUCAP 143* 133* 105* 68 152*    Microbiology: Results for orders placed or performed during the hospital encounter of 07/25/18  Culture, blood (routine x 2)     Status: None   Collection Time: 07/25/18  5:58 PM  Result Value Ref Range Status   Specimen Description BLOOD LEFT ANTECUBITAL  Final   Special Requests   Final    BOTTLES DRAWN AEROBIC AND ANAEROBIC Blood Culture adequate volume   Culture   Final    NO GROWTH 5 DAYS Performed at [facility], [address] [place], [place] [address]    Report Status 07/30/2018 FINAL  Final  Culture, blood (routine x 2)     Status: None   Collection Time: 07/25/18  6:11 PM  Result Value Ref Range Status   Specimen Description BLOOD BLOOD RIGHT FOREARM  Final   Special Requests   Final    BOTTLES DRAWN AEROBIC AND ANAEROBIC Blood Culture adequate volume   Culture   Final    NO GROWTH 5 DAYS Performed at [facility], [address] [place], [place] [address]    Report Status 07/30/2018 FINAL  Final  MRSA PCR Screening     Status: Abnormal   Collection Time: 07/27/18  8:32 AM  Result Value Ref Range Status   MRSA by PCR POSITIVE (A) NEGATIVE Final    Comment:        The GeneXpert MRSA Assay (FDA approved for NASAL specimens only), is one component of a comprehensive MRSA colonization surveillance program. It is not intended to diagnose MRSA infection nor to guide or monitor treatment for MRSA infections. RESULT CALLED TO, READ BACK BY AND VERIFIED WITH:  JENNIFER COLES AT 1005 07/27/18 SDR Performed at [facility], [address] [place], [place]  [address]   Aerobic/Anaerobic Culture (surgical/deep wound)     Status: None (Preliminary result)   Collection Time: 07/28/18  8:49 AM  Result Value Ref Range Status   Specimen Description   Final    BONE RIGHT CALCANEUS Performed at [facility], [place] [address], [place], [place] [address]    Special Requests   Final    NONE Performed at [facility], [address] [place], [place] [address]    Gram Stain   Final    RARE WBC PRESENT,BOTH PMN AND MONONUCLEAR NO ORGANISMS SEEN Performed at [facility], [place] [address] [place], [place] [address]    Culture   Final    FEW PROTEUS MIRABILIS NO ANAEROBES ISOLATED; CULTURE IN PROGRESS FOR 5 DAYS    Report Status PENDING  Incomplete   Organism ID, Bacteria PROTEUS MIRABILIS  Final      Susceptibility   Proteus mirabilis - MIC*    AMPICILLIN <=2 SENSITIVE Sensitive     CEFAZOLIN <=4 SENSITIVE Sensitive     CEFEPIME <=1 SENSITIVE Sensitive     CEFTAZIDIME <=1 SENSITIVE Sensitive     CEFTRIAXONE <=1 SENSITIVE Sensitive     CIPROFLOXACIN <=0.25 SENSITIVE Sensitive     GENTAMICIN <=1 SENSITIVE Sensitive     IMIPENEM 2 SENSITIVE Sensitive     TRIMETH/SULFA <=20 SENSITIVE Sensitive     AMPICILLIN/SULBACTAM <=2 SENSITIVE Sensitive     PIP/TAZO <=4 SENSITIVE Sensitive     * FEW PROTEUS MIRABILIS    Coagulation Studies: No results for input(s): LABPROT, INR in the last 72 hours.  Urinalysis: No results for input(s): COLORURINE, LABSPEC, PHURINE, GLUCOSEU, HGBUR, BILIRUBINUR, KETONESUR, PROTEINUR, UROBILINOGEN, NITRITE, LEUKOCYTESUR in the last 72 hours.  Invalid input(s): APPERANCEUR    Imaging: US Renal  Result Date: 07/29/2018 CLINICAL DATA:  Acute kidney failure. EXAM: RENAL / URINARY TRACT ULTRASOUND COMPLETE COMPARISON:  None. FINDINGS: Right Kidney: Renal measurements: 10.8 x 6.8 x 4.6 cm = volume: 179 mL . Echogenicity within normal limits. No mass or hydronephrosis visualized. Left Kidney: Renal  measurements: 11.7 x 6.2 x 6.1 cm = volume: 232 mL. Echogenicity within normal limits. No mass or hydronephrosis visualized. Bladder: Appears normal for degree of bladder distention. IMPRESSION: Normal renal ultrasound. Electronically Signed   By: Marijo Conception, M.D.   On: 07/29/2018 14:55     Medications:   . albumin human 25 g (07/31/18 0634)  . ampicillin-sulbactam (UNASYN) IV 3 g (  07/31/18 0825)   . allopurinol  100 mg Oral Daily  . aspirin EC  81 mg Oral Daily  . atorvastatin  10 mg Oral q1800  . carvedilol  3.125 mg Oral BID  . clopidogrel  75 mg Oral Daily  . gabapentin  400 mg Oral TID  . insulin aspart  0-9 Units Subcutaneous TID WC & HS  . insulin glargine  30 Units Subcutaneous QHS  . linezolid  600 mg Oral Q12H  . mupirocin ointment   Nasal BID  . predniSONE  5 mg Oral Q breakfast  . torsemide  40 mg Oral Daily   acetaminophen **OR** acetaminophen, oxyCODONE  Assessment/ Plan:  [name] is a 53 y.o. black male with diabetes mellitus type II, hypertension,coronary artery disease, congestive heart failure, ICD, carotid stenosis, CVA, obstructive sleep apnea, peripheral vascular disease, gout, who was admitted to [facility] on 07/25/2018 for Osteomyelitis rt Heel  1. Acute renal failure on chronic kidney disease stage IV: Creatinine 2.67, GFR of 30 on 05/29/18.  Chronic kidney disease secondary to diabetic nephropathy - not currently on an ACE-I/ARB - Ultrasound reviewed with patient - Discussed chronic kidney disease and dialysis.   2. Right heel osteomyelitis: if patient will need IV antibiotics. With leukocytosis. Recommend central line (Powerline)  2. Hypertension:   - carvedilol and torsemide.   3. Anemia: Hemoglobin 7.6. Normocytic.  Discussed ESA therapy with patient.   4. Diabetes mellitus type II with chronic kidney disease: insulin dependent. Hemoglobin A1c of 7.9%.  Complication of diabetic foot ulcer with osteomyelitis.    LOS: [place] 3/29/202012:55 PM

## 2018-08-01 DIAGNOSIS — Z9862 Peripheral vascular angioplasty status: Secondary | ICD-10-CM

## 2018-08-01 DIAGNOSIS — N179 Acute kidney failure, unspecified: Secondary | ICD-10-CM

## 2018-08-01 LAB — CBC
HCT: 26.3 % — ABNORMAL LOW (ref 39.0–52.0)
Hemoglobin: 7.9 g/dL — ABNORMAL LOW (ref 13.0–17.0)
MCH: 25.1 pg — ABNORMAL LOW (ref 26.0–34.0)
MCHC: 30 g/dL (ref 30.0–36.0)
MCV: 83.5 fL (ref 80.0–100.0)
PLATELETS: 354 10*3/uL (ref 150–400)
RBC: 3.15 MIL/uL — ABNORMAL LOW (ref 4.22–5.81)
RDW: 18.5 % — ABNORMAL HIGH (ref 11.5–15.5)
WBC: 15 10*3/uL — ABNORMAL HIGH (ref 4.0–10.5)
nRBC: 0 % (ref 0.0–0.2)

## 2018-08-01 LAB — GLUCOSE, CAPILLARY
Glucose-Capillary: 60 mg/dL — ABNORMAL LOW (ref 70–99)
Glucose-Capillary: 70 mg/dL (ref 70–99)
Glucose-Capillary: 113 mg/dL — ABNORMAL HIGH (ref 70–99)
Glucose-Capillary: 174 mg/dL — ABNORMAL HIGH (ref 70–99)
Glucose-Capillary: 140 mg/dL — ABNORMAL HIGH (ref 70–99)

## 2018-08-01 LAB — BASIC METABOLIC PANEL
Anion gap: 12 (ref 5–15)
BUN: 79 mg/dL — AB (ref 6–20)
CO2: 26 mmol/L (ref 22–32)
Calcium: 8.7 mg/dL — ABNORMAL LOW (ref 8.9–10.3)
Chloride: 99 mmol/L (ref 98–111)
Creatinine, Ser: 3.58 mg/dL — ABNORMAL HIGH (ref 0.61–1.24)
GFR calc Af Amer: 21 mL/min — ABNORMAL LOW (ref >60)
GFR, EST NON AFRICAN AMERICAN: 18 mL/min — AB (ref >60)
Glucose, Bld: 130 mg/dL — ABNORMAL HIGH (ref 70–99)
POTASSIUM: 4.7 mmol/L (ref 3.5–5.1)
Sodium: 137 mmol/L (ref 135–145)

## 2018-08-01 MED ORDER — HEPARIN SODIUM (PORCINE) 5000 UNIT/ML IJ SOLN
5000.0000 [IU] | Freq: Three times a day (TID) | INTRAMUSCULAR | Status: DC
  Administered 2018-08-01 – 2018-08-02 (×3): 5000 [IU] via SUBCUTANEOUS
  Filled 2018-08-01 (×3): qty 1

## 2018-08-01 MED ORDER — INSULIN GLARGINE 100 UNIT/ML ~~LOC~~ SOLN
20.0000 [IU] | Freq: Every day | SUBCUTANEOUS | Status: DC
  Administered 2018-08-01: 20 [IU] via SUBCUTANEOUS
  Filled 2018-08-01 (×2): qty 0.2

## 2018-08-01 MED ORDER — LEVOFLOXACIN 500 MG PO TABS
750.0000 mg | ORAL_TABLET | ORAL | Status: DC
  Administered 2018-08-01: 750 mg via ORAL
  Filled 2018-08-01: qty 2

## 2018-08-01 NOTE — Progress Notes (Signed)
[facility]  ROUNDING NOTE   Subjective:   Patient is doing fair.  Able to eat without nausea or vomiting. Ongoing foot care by podiatrist.  Objective:  Vital signs in last 24 hours:  Temp:  [97.5 F (36.4 C)-98.6 F (37 C)] 98.3 F (36.8 C) (03/30 0815) Pulse Rate:  [75-81] 77 (03/30 0815) Resp:  [16-18] 16 (03/30 0815) BP: (116-156)/(50-79) 131/73 (03/30 0815) SpO2:  [96 %-100 %] 100 % (03/30 0815)  Weight change:  Filed Weights   07/25/18 1800  Weight: 94.8 kg    Intake/Output: I/O last 3 completed shifts: In: -  Out: 1601 [Urine:1600; Stool:1]   Intake/Output this shift:  Total I/O In: -  Out: 450 [Urine:450]  Physical Exam: General: NAD,   Head: Normocephalic, atraumatic. Moist oral mucosal membranes  Eyes: Anicteric,   Neck: Supple, trachea midline  Lungs:  Clear to auscultation  Heart: Regular rate and rhythm  Abdomen:  Soft, nontender,   Extremities:  bilateral lower extremity wound dressings, trace edema,   Neurologic: Nonfocal, moving all four extremities  Skin: No lesions        Basic Metabolic Panel: Recent Labs  Lab 07/27/18 0316 07/28/18 0304 07/29/18 0337 07/30/18 0404 07/31/18 0410 08/01/18 0326  NA 138 135  --  135 135 137  K 3.6 3.8  --  4.7 4.8 4.7  CL 98 98  --  99 102 99  CO2 30 27  --  26 27 26   GLUCOSE 146* 152*  --  157* 126* 130*  BUN 56* 53*  --  76* 82* 79*  CREATININE 2.39* 2.61* 3.35* 3.58*  3.57* 3.67* 3.58*  CALCIUM 9.1 8.1*  --  8.3* 8.0* 8.7*  PHOS  --   --   --  4.2 4.5  --     Liver Function Tests: Recent Labs  Lab 07/30/18 0404 07/31/18 0410  AST 13*  --   ALT 11  --   ALKPHOS 82  --   BILITOT 0.4  --   PROT 6.3*  --   ALBUMIN 2.2* 2.8*   No results for input(s): LIPASE, AMYLASE in the last 168 hours. No results for input(s): AMMONIA in the last 168 hours.  CBC: Recent Labs  Lab 07/25/18 1750 07/26/18 0439 07/28/18 0304 07/31/18 0410 08/01/18 0326  WBC 21.4* 17.0* 20.2* 15.2*  15.0*  NEUTROABS  --   --   --  12.6*  --   HGB 10.2* 8.6* 8.6* 7.6* 7.9*  HCT 33.7* 28.6* 28.7* 25.2* 26.3*  MCV 82.4 82.7 84.7 84.0 83.5  PLT 429* 379 345 345 354    Cardiac Enzymes: No results for input(s): CKTOTAL, CKMB, CKMBINDEX, TROPONINI in the last 168 hours.  BNP: Invalid input(s): POCBNP  CBG: Recent Labs  Lab 07/31/18 1146 07/31/18 1640 07/31/18 2104 08/01/18 0822 08/01/18 0908  GLUCAP 152* 140* 125* 60* 14    Microbiology: Results for orders placed or performed during the hospital encounter of 07/25/18  Culture, blood (routine x 2)     Status: None   Collection Time: 07/25/18  5:58 PM  Result Value Ref Range Status   Specimen Description BLOOD LEFT ANTECUBITAL  Final   Special Requests   Final    BOTTLES DRAWN AEROBIC AND ANAEROBIC Blood Culture adequate volume   Culture   Final    NO GROWTH 5 DAYS Performed at [facility], [address], [place], [place] [address]    Report Status 07/30/2018 FINAL  Final  Culture, blood (  routine x 2)     Status: None   Collection Time: 07/25/18  6:11 PM  Result Value Ref Range Status   Specimen Description BLOOD BLOOD RIGHT FOREARM  Final   Special Requests   Final    BOTTLES DRAWN AEROBIC AND ANAEROBIC Blood Culture adequate volume   Culture   Final    NO GROWTH 5 DAYS Performed at [facility], [address] [place], [place] [address]    Report Status 07/30/2018 FINAL  Final  MRSA PCR Screening     Status: Abnormal   Collection Time: 07/27/18  8:32 AM  Result Value Ref Range Status   MRSA by PCR POSITIVE (A) NEGATIVE Final    Comment:        The GeneXpert MRSA Assay (FDA approved for NASAL specimens only), is one component of a comprehensive MRSA colonization surveillance program. It is not intended to diagnose MRSA infection nor to guide or monitor treatment for MRSA infections. RESULT CALLED TO, READ BACK BY AND VERIFIED WITH:  JENNIFER COLES AT 1005 07/27/18 SDR Performed at  [facility], [address] [place], [place] [address]   Aerobic/Anaerobic Culture (surgical/deep wound)     Status: None (Preliminary result)   Collection Time: 07/28/18  8:49 AM  Result Value Ref Range Status   Specimen Description   Final    BONE RIGHT CALCANEUS Performed at [facility], [place] [address] [place], [place] [address]    Special Requests   Final    NONE Performed at [facility], [place],  [address]    Gram Stain   Final    RARE WBC PRESENT,BOTH PMN AND MONONUCLEAR NO ORGANISMS SEEN    Culture   Final    FEW PROTEUS MIRABILIS NO ANAEROBES ISOLATED; CULTURE IN PROGRESS FOR 5 DAYS CRITICAL RESULT CALLED TO, READ BACK BY AND VERIFIED WITH: I. DAVIS, RN ([place]) CONCERNING CULTURE GROWTH AT [place] ON 07/31/18 BY C. JESSUP, MLT.  Performed at [facility], [place] [address] [place],  [address]    Report Status PENDING  Incomplete   Organism ID, Bacteria PROTEUS MIRABILIS  Final      Susceptibility   Proteus mirabilis - MIC*    AMPICILLIN <=2 SENSITIVE Sensitive     CEFAZOLIN <=4 SENSITIVE Sensitive     CEFEPIME <=1 SENSITIVE Sensitive     CEFTAZIDIME <=1 SENSITIVE Sensitive     CEFTRIAXONE <=1 SENSITIVE Sensitive     CIPROFLOXACIN <=0.25 SENSITIVE Sensitive     GENTAMICIN <=1 SENSITIVE Sensitive     IMIPENEM 2 SENSITIVE Sensitive     TRIMETH/SULFA <=20 SENSITIVE Sensitive     AMPICILLIN/SULBACTAM <=2 SENSITIVE Sensitive     PIP/TAZO <=4 SENSITIVE Sensitive     * FEW PROTEUS MIRABILIS    Coagulation Studies: No results for input(s): LABPROT, INR in the last 72 hours.  Urinalysis: No results for input(s): COLORURINE, LABSPEC, PHURINE, GLUCOSEU, HGBUR, BILIRUBINUR, KETONESUR, PROTEINUR, UROBILINOGEN, NITRITE, LEUKOCYTESUR in the last 72 hours.  Invalid input(s): APPERANCEUR    Imaging: No results found.   Medications:   . albumin human 25 g (08/01/18 0803)  . ampicillin-sulbactam (UNASYN) IV 3 g  (07/31/18 2121)   . allopurinol  100 mg Oral Daily  . aspirin EC  81 mg Oral Daily  . atorvastatin  10 mg Oral q1800  . carvedilol  3.125 mg Oral BID  . clopidogrel  75 mg Oral Daily  . gabapentin  400 mg Oral TID  .  insulin aspart  0-9 Units Subcutaneous TID WC & HS  . insulin glargine  30 Units Subcutaneous QHS  . linezolid  600 mg Oral Q12H  . mupirocin ointment   Nasal BID  . predniSONE  5 mg Oral Q breakfast  . torsemide  40 mg Oral Daily   acetaminophen **OR** acetaminophen, oxyCODONE  Assessment/ Plan:  [name] is a 53 y.o. black male with diabetes mellitus type II, hypertension,coronary artery disease, congestive heart failure, ICD, carotid stenosis, CVA, obstructive sleep apnea, peripheral vascular disease, gout, who was admitted to [facility] on 07/25/2018 for Osteomyelitis rt Heel  1. Acute renal failure on chronic kidney disease stage IV: Creatinine 2.67, GFR of 30 on 05/29/18.  Chronic kidney disease secondary to diabetic nephropathy - not currently on an ACE-I/ARB - Ultrasound reviewed with patient -We will arrange for outpatient follow-up  2. Right heel osteomyelitis: if patient will need IV antibiotics. With leukocytosis. Recommend central line (Powerline)  3. Hypertension with CKD:   - carvedilol and torsemide.   4. Anemia: Hemoglobin 7.9. Normocytic.  Discussed ESA therapy with patient.   5. Diabetes mellitus type II with chronic kidney disease: insulin dependent. Hemoglobin A1c of 7.9%.  Complication of diabetic foot ulcer with osteomyelitis.    LOS: 7 Viviana Trimble 3/30/202010:18 AM

## 2018-08-01 NOTE — Progress Notes (Signed)
Inpatient Diabetes Program Recommendations  AACE/ADA: New Consensus Statement on Inpatient Glycemic Control   Target Ranges:  Prepandial:   less than 140 mg/dL      Peak postprandial:   less than 180 mg/dL (1-2 hours)      Critically ill patients:  140 - 180 mg/dL   Results for Paul, [name] (MRN [id number]) as of 08/01/2018 12:16  Ref. Range 07/31/2018 07:28 07/31/2018 11:46 07/31/2018 16:40 07/31/2018 21:04 08/01/2018 08:22 08/01/2018 09:08 08/01/2018 11:57  Glucose-Capillary Latest Ref Range: 70 - 99 mg/dL 92 152 (H) 140 (H) 125 (H) 60 (L) 70 113 (H)   Review of Glycemic Control Diabetes history:DM2 Outpatient Diabetes medications:Lantus 30 units QHS Current orders for Inpatient glycemic control:Lantus 30 units QHS, Novolog 0-9 units AC&HS; Prednisone 5 mg QAM   Inpatient Diabetes Program Recommendations:  Insulin-Basal: Please consider decreasing Lantus to 28 units QHS.  Thanks, Barnie Alderman, RN, MSN, CDE Diabetes Coordinator Inpatient Diabetes Program [phone] (Team Pager from 8am to 5pm)

## 2018-08-01 NOTE — Progress Notes (Signed)
[place] [facility] [place] NAME: Colson Barco    MR#:  [id number]  DATE OF BIRTH:  Jul 20, 1965  SUBJECTIVE:   Patient slept well. Denies any complaints. Has bilateral wound VAC REVIEW OF SYSTEMS:   Review of Systems  Constitutional: Negative for chills, fever and weight loss.  HENT: Negative for ear discharge, ear pain and nosebleeds.   Eyes: Negative for blurred vision, pain and discharge.  Respiratory: Negative for sputum production, shortness of breath, wheezing and stridor.   Cardiovascular: Negative for chest pain, palpitations, orthopnea and PND.  Gastrointestinal: Negative for abdominal pain, diarrhea, nausea and vomiting.  Genitourinary: Negative for frequency and urgency.  Musculoskeletal: Negative for back pain and joint pain.  Neurological: Positive for weakness. Negative for sensory change, speech change and focal weakness.  Psychiatric/Behavioral: Negative for depression and hallucinations. The patient is not nervous/anxious.    Tolerating Diet:yes Tolerating PT: yes  DRUG ALLERGIES:   Allergies  Allergen Reactions  . Ibuprofen Other (See Comments)    Heart problems  . Baclofen Other (See Comments)  . Metformin Diarrhea  . Nsaids     Due to kidney and heart problems per pt    VITALS:  Blood pressure 117/72, pulse 81, temperature 97.8 F (36.6 C), temperature source Oral, resp. rate 18, height 5\' 10"  (1.778 m), weight 94.8 kg, SpO2 100 %.  PHYSICAL EXAMINATION:   Physical Exam  GENERAL:  53 y.o.-year-old patient lying in the bed with no acute distress.  EYES: Pupils equal, round, reactive to light and accommodation. No scleral icterus. Extraocular muscles intact.  HEENT: Head atraumatic, normocephalic. Oropharynx and nasopharynx clear.  NECK:  Supple, no jugular venous distention. No thyroid enlargement, no tenderness.  LUNGS: Normal breath sounds bilaterally, no wheezing, rales, rhonchi. No use of accessory  muscles of respiration.  CARDIOVASCULAR: S1, S2 normal. No murmurs, rubs, or gallops.  ABDOMEN: Soft, nontender, nondistended. Bowel sounds present. No organomegaly or mass.  EXTREMITIES: bilateral wound VAC. He has healed protective boots on both lower extremity. Lateral lower extremity chronic venous stasis changes of the skin. NEUROLOGIC: Cranial nerves II through XII are intact. No focal Motordeficits b/l.   PSYCHIATRIC:  patient is alert and oriented x 3.  SKIN: No obvious rash, lesion   LABORATORY PANEL:  CBC Recent Labs  Lab 08/01/18 0326  WBC 15.0*  HGB 7.9*  HCT 26.3*  PLT 354    Chemistries  Recent Labs  Lab 07/30/18 0404  08/01/18 0326  NA 135   < > 137  K 4.7   < > 4.7  CL 99   < > 99  CO2 26   < > 26  GLUCOSE 157*   < > 130*  BUN 76*   < > 79*  CREATININE 3.58*  3.57*   < > 3.58*  CALCIUM 8.3*   < > 8.7*  AST 13*  --   --   ALT 11  --   --   ALKPHOS 82  --   --   BILITOT 0.4  --   --    < > = values in this interval not displayed.   Cardiac Enzymes No results for input(s): TROPONINI in the last 168 hours. RADIOLOGY:  No results found. ASSESSMENT AND PLAN:  53 year old male with past medical history of stroke, carotid stenosis status post left carotid stent, CAD status post CABG,  ischemic cardiomyopathy status post defibrillator, A. fib on Eliquis, diabetes, nonhealing diabetic heel ulcers presents to  hospital secondary to worsening right heel ulceration.  1.Bilateral  heel nonhealing ulcer with possible right heel osteomyelitis-seen by podiatry Dr troxler - continue wound VAC, IV antibiotics for 4 weeks -  Patient wants to [facility]--CSW for d/c planning -Outpatient cultures growing MRSA and Proteus -Bone culture from 3/26--Proteus mirabilis -ID and podiatry consult appreciated -s/p bilateral foot surgery 1.  Excisional debridement of right heel ulcer with the versa jet to include removal of infected and necrotic skin soft  tissue tendon and removal of bone.  A small portion of bone was removed the posterior calcaneus.  Wound is 5.1 cm long and 6.2 cm wide with full-thickness depth down to the bone.   2.  Excisional debridement of necrotic skin and subcutaneous tissue from the left heel ulcer.  Wound is 3.5 cm in length and 3.7 cm in width with depth down to the fatty subcutaneous tissue  -CT on admission of the right ankle and calcaneum showing mild new cortical bone loss changes concerning for evolving osteomyelitis. -Status post angiogram with angioplasty -Continue Unasyn, Zyvox--de-escalate abxs now that we have bone culture results  2.  Diabetes mellitus-continue Lantus, sliding scale insulin.  Blood sugar well controlled.  -On gabapentin for diabetic neuropathy  3.  Acute renal failure on CKD stage 4: cr at baseline.  Not currently on ACE inhibitor's, renal ultrasound did not show any acute obstruction, seen by nephrology, monitor kidney function closely, creatinine 3.67 today started on torsemide 40 mg daily by nephrology. -future HD discussed by nephrology   4.  CAD-stable.  Status post CABG in 2010 and ICD placement.  Recently battery change done last year. -Continue aspirin, Coreg  5.  DVT prophylaxis-Heparin   CODE STATUS: full code  CSW for d/c planning  TOTAL TIME TAKING CARE OF THIS PATIENT: *30* minutes.  >50% time spent on counselling and coordination of care  POSSIBLE D/C IN *?** DAYS, DEPENDING ON CLINICAL CONDITION.  Note: This dictation was prepared with Dragon dictation along with smaller phrase technology. Any transcriptional errors that result from this process are unintentional.  Fritzi Mandes M.D on 08/01/2018 at 7:40 AM  Between 7am to 6pm - Pager - [phone]  After 6pm go to [email or web address] - password [email or web address] [facility]  Office  [phone]  CC: Primary care physician; Casilda Carls, MDPatient ID: [name], male   DOB: 07-Jun-1965, 53 y.o.    MRN: [id number]

## 2018-08-01 NOTE — TOC Progression Note (Signed)
Transition of Care (TOC) - Progression Note    Patient Details  Name: Paul [name]. MRN: [id number] Date of Birth: May 08, 1965  Transition of Care [facility]) CM/SW Contact  Katrina Stack, RN Phone Number: 08/01/2018, 5:51 PM  Clinical Narrative:   CM contacted by [facility] requesting information about whether he had any secondary insurance. Patient spent 99 days in a skilled facility in [place] discharging jan 15.  he was readmitted to [facility] on Jan 23 and discharged 05/29/2018. Per [facility], patient   Patient did not have 60 consecutive days out of a facility before being admitted on July 25, 2018.  H receives disability but says he does not qualify for medicaid because he would have to show evidence that he and his wife have been separated for one year (which they have not and patient does not want to be untruthful about this.  One year will occur in September 2020).  He was receiving wound vac therapy prior to this admission and has the device at home.  He does not have medicare part d coverage.  Did have it but "didn't realize he had to pay for it and lost it."  CM discussed the need to pursue part d to assist with medications and patient his Medicare SHIP information at home to complete and mail in. Also discussed Advant[employer] Plans that could include drug coverage  and to investigate before open  enrollment October 2020. Since patient is not going to have medicare days to pay for SNF and will not be able to get approval for medicaid until he has been separated from his wife for one year, He says he will discharge home.  Says he now has more help at home and able to manage.  There was concern from home health agency. Patient's goal now is to go home tomorrow and to stay out of medical facilities for 60 consecutive days.  When he has his upcoming back surgery "the doctor has said no if ands or buts - I will have to go to a facility."  Sent message to [facility] that patient should discharge home  tomorrow but it is after hours.         Expected Discharge Plan and Services           Expected Discharge Date: 08/01/18                         Social Determinants of Health (SDOH) Interventions    Readmission Risk Interventions No flowsheet data found.

## 2018-08-01 NOTE — Progress Notes (Signed)
   Date of Admission:  07/25/2018      ID: Paul [name]. is a 53 y.o. male B/l heel ulcers   Subjective: Doing better   Medications:  . allopurinol  100 mg Oral Daily  . aspirin EC  81 mg Oral Daily  . atorvastatin  10 mg Oral q1800  . carvedilol  3.125 mg Oral BID  . clopidogrel  75 mg Oral Daily  . gabapentin  400 mg Oral TID  . heparin injection (subcutaneous)  5,000 Units Subcutaneous Q8H  . insulin aspart  0-9 Units Subcutaneous TID WC & HS  . insulin glargine  20 Units Subcutaneous QHS  . levofloxacin  750 mg Oral Q48H  . linezolid  600 mg Oral Q12H  . mupirocin ointment   Nasal BID  . predniSONE  5 mg Oral Q breakfast    Objective: Vital signs in last 24 hours: Temp:  [97.8 F (36.6 C)-98.6 F (37 C)] 98.3 F (36.8 C) (03/30 0815) Pulse Rate:  [75-81] 77 (03/30 0815) Resp:  [16-18] 16 (03/30 0815) BP: (116-156)/(50-79) 131/73 (03/30 0815) SpO2:  [96 %-100 %] 100 % (03/30 0815)  PHYSICAL EXAM:  General: Alert, cooperative, no distress, appears stated age.   Extremities: b/l heel wounds evaluated Left heel wound looks healthy with granulation tissue Rt heel covered by meplex      Skin: No rashes or lesions. Or bruising Lymph: Cervical, supraclavicular normal. Neurologic: Grossly non-focal  Lab Results Recent Labs    07/31/18 0410 08/01/18 0326  WBC 15.2* 15.0*  HGB 7.6* 7.9*  HCT 25.2* 26.3*  NA 135 137  K 4.8 4.7  CL 102 99  CO2 27 26  BUN 82* 79*  CREATININE 3.67* 3.58*   Liver Panel Recent Labs    07/30/18 0404 07/31/18 0410  PROT 6.3*  --   ALBUMIN 2.2* 2.8*  AST 13*  --   ALT 11  --   ALKPHOS 82  --   BILITOT 0.4  --     Assessment/Plan: 53 y.o. 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   DFI B/l infected  heel ulcers - since 6 months-  Recent culture proteus and MRSA on  07/20/18. Surgical culture Proteus No bone sent for HPE as the bone was good as per podiatrist  and low risk for Osteo Currently on linezolid and unasyn- will DC unasyn and change to levaquin adjusted to crcl for 14 days and linezolid PO for 10 days Will follow pt as OP with Dr.Troxler CBC/CMP  in 1 weeks  AKI on CKD   PAD-had angio ? DM- management as per primary team?  CAD   Discussed the management with patient in detail- explained side effects of linezolid and levaquin Pt seen with  with Dr.Troxler

## 2018-08-01 NOTE — TOC Progression Note (Signed)
Transition of Care (TOC) - Progression Note    Patient Details  Name: Paul [name]. MRN: [id number] Date of Birth: [birth year]  Transition of Care [facility]) CM/SW Contact  Katrina Stack, RN Phone Number: 08/01/2018, 3:00 PM  Clinical Narrative:   Per ID, patient is not going to require IV antibiotics post discharge. Have sent FL2- bed offers from [facility], [facility]. Patient wishes to hear from the others: [facility], [place], [facility] before decides.  Will require wound vac.  Patient does have one vac providing therapy to both heel wounds.       Expected Discharge Plan and Services   Skilled nursing         Expected Discharge Date: 08/01/18                         Social Determinants of Health (SDOH) Interventions    Readmission Risk Interventions No flowsheet data found.

## 2018-08-01 NOTE — Care Management Important Message (Signed)
Important Message  Patient Details  Name: Paul [name]. MRN: [id number] Date of Birth: December 11, 1965   Medicare Important Message Given:  Yes  The important Medicare Message from Aleck Locklin was obtained over a verbal consent and a copy was provided to the patient in the room by the bedside RN  [name], RN 08/01/2018, 11:17 AM

## 2018-08-01 NOTE — NC FL2 (Signed)
[place] LEVEL OF CARE SCREENING TOOL     IDENTIFICATION  Patient Name: Paul [name]. Birthdate: May 13, 1965 Sex: male Admission Date (Current Location): 07/25/2018  [place] and [place] Number:  Engineering geologist and Address:  [facility], [address], [place], [place] [address]      Provider Number: [id number]  Attending Physician Name and Address:  Fritzi Mandes, MD  Relative Name and Phone Number:       Current Level of Care: Hospital Recommended Level of Care: Nursing Facility Prior Approval Number:    Date Approved/Denied:   PASRR Number: pending  Discharge Plan: SNF    Current Diagnoses: Patient Active Problem List   Diagnosis Date Noted  . Acute osteomyelitis of right foot ([place]) 07/25/2018  . Osteomyelitis ([place]) 07/25/2018  . Anasarca 05/25/2018  . Pressure injury of skin 05/25/2018  . CKD (chronic kidney disease), stage III ([place]) 12/15/2016  . Ulcerated, foot ([place]) 12/15/2016  . Gangrene of foot ([place]) 08/21/2016  . HTN (hypertension) 08/21/2016  . Diabetes ([place]) 08/21/2016  . CAD (coronary artery disease) 08/21/2016  . Chronic systolic CHF (congestive heart failure) ([place]) 08/21/2016  . Diabetic foot infection ([place]) 08/21/2016  . Heterotopic ossification of bone 07/12/2014    Orientation RESPIRATION BLADDER Height & Weight     Self, Time, Situation, Place  Normal Continent Weight: 94.8 kg Height:  5\' 10"  (177.8 cm)  BEHAVIORAL SYMPTOMS/MOOD NEUROLOGICAL BOWEL NUTRITION STATUS  Other (Comment)(NA) (NA) Continent Diet  AMBULATORY STATUS COMMUNICATION OF NEEDS Skin   Limited Assist Verbally Surgical wounds, Wound Vac(wound vac to both lower extremities)                       Personal Care Assistance Level of Assistance  Bathing, Dressing     Dressing Assistance: Maximum assistance     Functional Limitations Info  (NA)          SPECIAL CARE FACTORS FREQUENCY  PT (By licensed PT), OT (By  licensed OT)     PT Frequency: 5-7 times a week OT Frequency: 5-7 times a week            Contractures Contractures Info: Not present    Additional Factors Info  Code Status(Full code)               Current Medications (08/01/2018):  This is the current hospital active medication list Current Facility-Administered Medications  Medication Dose Route Frequency Provider Last Rate Last Dose  . acetaminophen (TYLENOL) tablet 650 mg  650 mg Oral Q6H PRN Algernon Huxley, MD   650 mg at 08/01/18 6644   Or  . acetaminophen (TYLENOL) suppository 650 mg  650 mg Rectal Q6H PRN Algernon Huxley, MD      . albumin human 25 % solution 25 g  25 g Intravenous Q8H Kolluru, Sarath, MD 60 mL/hr at 08/01/18 0803 25 g at 08/01/18 0803  . allopurinol (ZYLOPRIM) tablet 100 mg  100 mg Oral Daily Epifanio Lesches, MD   100 mg at 08/01/18 0911  . Ampicillin-Sulbactam (UNASYN) 3 g in sodium chloride 0.9 % 100 mL IVPB  3 g Intravenous Q12H Ravishankar, Joellyn Quails, MD 200 mL/hr at 08/01/18 1055 3 g at 08/01/18 1055  . aspirin EC tablet 81 mg  81 mg Oral Daily Algernon Huxley, MD   81 mg at 08/01/18 0910  . atorvastatin (LIPITOR) tablet 10 mg  10 mg Oral q1800 Algernon Huxley, MD  10 mg at 07/31/18 1646  . carvedilol (COREG) tablet 3.125 mg  3.125 mg Oral BID Algernon Huxley, MD   3.125 mg at 08/01/18 0910  . clopidogrel (PLAVIX) tablet 75 mg  75 mg Oral Daily Algernon Huxley, MD   75 mg at 08/01/18 0910  . gabapentin (NEURONTIN) capsule 400 mg  400 mg Oral TID Algernon Huxley, MD   400 mg at 08/01/18 0911  . insulin aspart (novoLOG) injection 0-9 Units  0-9 Units Subcutaneous TID WC & HS Dustin Flock, MD   1 Units at 07/31/18 2151  . insulin glargine (LANTUS) injection 30 Units  30 Units Subcutaneous QHS Algernon Huxley, MD   30 Units at 07/31/18 2152  . linezolid (ZYVOX) tablet 600 mg  600 mg Oral Q12H Tsosie Billing, MD   600 mg at 08/01/18 0911  . mupirocin ointment (BACTROBAN) 2 %   Nasal BID Gladstone Lighter, MD       . oxyCODONE (Oxy IR/ROXICODONE) immediate release tablet 10 mg  10 mg Oral Q6H PRN Algernon Huxley, MD   10 mg at 08/01/18 0814  . predniSONE (DELTASONE) tablet 5 mg  5 mg Oral Q breakfast Algernon Huxley, MD   5 mg at 08/01/18 0911     Discharge Medications: Please see discharge summary for a list of discharge medications.  Relevant Imaging Results:  Relevant Lab Results:   Additional Information It has not been determined at present whether patient will need IV antibiotics.  If he does, wuill havew a PICC line placed  Katrina Stack, RN

## 2018-08-02 DIAGNOSIS — B964 Proteus (mirabilis) (morganii) as the cause of diseases classified elsewhere: Secondary | ICD-10-CM | POA: Diagnosis not present

## 2018-08-02 DIAGNOSIS — Z4800 Encounter for change or removal of nonsurgical wound dressing: Secondary | ICD-10-CM | POA: Diagnosis not present

## 2018-08-02 DIAGNOSIS — L89613 Pressure ulcer of right heel, stage 3: Secondary | ICD-10-CM | POA: Diagnosis not present

## 2018-08-02 DIAGNOSIS — E1151 Type 2 diabetes mellitus with diabetic peripheral angiopathy without gangrene: Secondary | ICD-10-CM | POA: Diagnosis not present

## 2018-08-02 DIAGNOSIS — L89623 Pressure ulcer of left heel, stage 3: Secondary | ICD-10-CM | POA: Diagnosis not present

## 2018-08-02 DIAGNOSIS — Z89421 Acquired absence of other right toe(s): Secondary | ICD-10-CM | POA: Diagnosis not present

## 2018-08-02 LAB — AEROBIC/ANAEROBIC CULTURE W GRAM STAIN (SURGICAL/DEEP WOUND)

## 2018-08-02 LAB — AEROBIC/ANAEROBIC CULTURE (SURGICAL/DEEP WOUND)

## 2018-08-02 LAB — GLUCOSE, CAPILLARY
Glucose-Capillary: 62 mg/dL — ABNORMAL LOW (ref 70–99)
Glucose-Capillary: 63 mg/dL — ABNORMAL LOW (ref 70–99)
Glucose-Capillary: 82 mg/dL (ref 70–99)
Glucose-Capillary: 120 mg/dL — ABNORMAL HIGH (ref 70–99)

## 2018-08-02 LAB — PARATHYROID HORMONE, INTACT (NO CA): PTH: 66 pg/mL — ABNORMAL HIGH (ref 15–65)

## 2018-08-02 MED ORDER — PREDNISONE 5 MG PO TABS: 5.0000 mg | ORAL_TABLET | Freq: Every day | ORAL | 0 refills | Status: DC

## 2018-08-02 MED ORDER — LEVOFLOXACIN 750 MG PO TABS: 750.0000 mg | ORAL_TABLET | ORAL | 0 refills | Status: AC

## 2018-08-02 MED ORDER — LINEZOLID 600 MG PO TABS: 600.0000 mg | ORAL_TABLET | Freq: Two times a day (BID) | ORAL | 0 refills | Status: AC

## 2018-08-02 MED ORDER — ALLOPURINOL 100 MG PO TABS: 100.0000 mg | ORAL_TABLET | Freq: Every day | ORAL | 0 refills | Status: DC

## 2018-08-02 NOTE — TOC Progression Note (Signed)
Transition of Care (TOC) - Progression Note    Patient Details  Name: Paul [name]. MRN: [id number] Date of Birth: 12-02-1965  Transition of Care [facility]) CM/SW Contact  [name], RN Phone Number: 08/02/2018, 9:34 AM  Clinical Narrative:     Met with the patient to discuss DC Plan  He does not have drug coverage  I checked the good RX app the total for the antibiotics and notified the patient that with Good RX the total for the Antibiotics will be approx $53.00, he stated that he would get those, I let him know the cheapest place to obtain the meds with Good [facility] is [facility].  He declines the need for a Wheelchair and says that he has all of the DME he needs, He Has a wound Vac at home from [facility].  I notified Corene Cornea with [facility] that the Dressing would need to be changed to fit the hook up for the [employer] wound vac today, Corene Cornea stated that he would let me know shortly if [facility] was going to accept the patient.  The patient stated that he needs a taxi to get home due to not having anyone that can come and get him, Provided a [employer] along with Good RX card. Awaiting to hear from [facility] if they can accept the patient,   Expected Discharge Plan: [place] Barriers to Discharge: No Barriers Identified  Expected Discharge Plan and Services Expected Discharge Plan: [place] arrangements for the past 2 months: Single Family Home Expected Discharge Date: 08/01/18                   HH Arranged: RN, PT, OT, Nurse's Aide, Social Work [employer] Agency: Microbiologist ([facility])   Social Determinants of Health (SDOH) Interventions    Readmission Risk Interventions No flowsheet data found.

## 2018-08-02 NOTE — TOC Progression Note (Signed)
Transition of Care (TOC) - Progression Note    Patient Details  Name: Paul [name]. MRN: [id number] Date of Birth: 1965-11-02  Transition of Care [facility]) CM/SW Contact  [name], RN Phone Number: 08/02/2018, 10:23 AM  Clinical Narrative:    Spoke with [name] from [employer], he will bring dressing with hookup to be changed out on the patient today before DC so he can hook up to the wound vac he currently has at home   Expected Discharge Plan: [place] Barriers to Discharge: No Barriers Identified  Expected Discharge Plan and Services Expected Discharge Plan: [place] arrangements for the past 2 months: Single Family Home Expected Discharge Date: 08/01/18                   HH Arranged: RN, PT, OT, Nurse's Aide, Social Work [employer] Agency: Microbiologist ([place])   Social Determinants of Health (SDOH) Interventions    Readmission Risk Interventions No flowsheet data found.

## 2018-08-02 NOTE — Progress Notes (Signed)
[facility]  ROUNDING NOTE   Subjective:   Patient is doing fair.  Able to eat without nausea or vomiting. Ongoing foot care by podiatrist.  Objective:  Vital signs in last 24 hours:  Temp:  [98 F (36.7 C)-98.1 F (36.7 C)] 98 F (36.7 C) (03/31 0735) Pulse Rate:  [77-89] 89 (03/31 0831) Resp:  [15-19] 15 (03/31 0735) BP: (99-165)/(62-93) 165/93 (03/31 0831) SpO2:  [94 %-98 %] 98 % (03/31 0831)  Weight change:  Filed Weights   07/25/18 1800  Weight: 94.8 kg    Intake/Output: I/O last 3 completed shifts: In: -  Out: 1671 [Urine:1670; Stool:1]   Intake/Output this shift:  No intake/output data recorded.  Physical Exam: General: NAD,   Head: Normocephalic, atraumatic. Moist oral mucosal membranes  Eyes: Anicteric,   Neck: Supple, trachea midline  Lungs:  Clear to auscultation  Heart: Regular rate and rhythm  Abdomen:  Soft, nontender,   Extremities:  bilateral lower extremity wound dressings, trace edema, wound vacs in place  Neurologic: Nonfocal, moving all four extremities  Skin: No lesions        Basic Metabolic Panel: Recent Labs  Lab 07/27/18 0316 07/28/18 0304 07/29/18 0337 07/30/18 0404 07/31/18 0410 08/01/18 0326  NA 138 135  --  135 135 137  K 3.6 3.8  --  4.7 4.8 4.7  CL 98 98  --  99 102 99  CO2 30 27  --  26 27 26   GLUCOSE 146* 152*  --  157* 126* 130*  BUN 56* 53*  --  76* 82* 79*  CREATININE 2.39* 2.61* 3.35* 3.58*  3.57* 3.67* 3.58*  CALCIUM 9.1 8.1*  --  8.3* 8.0* 8.7*  PHOS  --   --   --  4.2 4.5  --     Liver Function Tests: Recent Labs  Lab 07/30/18 0404 07/31/18 0410  AST 13*  --   ALT 11  --   ALKPHOS 82  --   BILITOT 0.4  --   PROT 6.3*  --   ALBUMIN 2.2* 2.8*   No results for input(s): LIPASE, AMYLASE in the last 168 hours. No results for input(s): AMMONIA in the last 168 hours.  CBC: Recent Labs  Lab 07/28/18 0304 07/31/18 0410 08/01/18 0326  WBC 20.2* 15.2* 15.0*  NEUTROABS  --  12.6*  --    HGB 8.6* 7.6* 7.9*  HCT 28.7* 25.2* 26.3*  MCV 84.7 84.0 83.5  PLT 345 345 354    Cardiac Enzymes: No results for input(s): CKTOTAL, CKMB, CKMBINDEX, TROPONINI in the last 168 hours.  BNP: Invalid input(s): POCBNP  CBG: Recent Labs  Lab 08/01/18 1708 08/01/18 2108 08/02/18 0738 08/02/18 0802 08/02/18 0846  GLUCAP 174* 140* 62* 63* 82    Microbiology: Results for orders placed or performed during the hospital encounter of 07/25/18  Culture, blood (routine x 2)     Status: None   Collection Time: 07/25/18  5:58 PM  Result Value Ref Range Status   Specimen Description BLOOD LEFT ANTECUBITAL  Final   Special Requests   Final    BOTTLES DRAWN AEROBIC AND ANAEROBIC Blood Culture adequate volume   Culture   Final    NO GROWTH 5 DAYS Performed at [facility], [address], [place], [place] [address]    Report Status 07/30/2018 FINAL  Final  Culture, blood (routine x 2)     Status: None   Collection Time: 07/25/18  6:11 PM  Result Value Ref  Range Status   Specimen Description BLOOD BLOOD RIGHT FOREARM  Final   Special Requests   Final    BOTTLES DRAWN AEROBIC AND ANAEROBIC Blood Culture adequate volume   Culture   Final    NO GROWTH 5 DAYS Performed at [facility], [address] [place], [place] [address]    Report Status 07/30/2018 FINAL  Final  MRSA PCR Screening     Status: Abnormal   Collection Time: 07/27/18  8:32 AM  Result Value Ref Range Status   MRSA by PCR POSITIVE (A) NEGATIVE Final    Comment:        The GeneXpert MRSA Assay (FDA approved for NASAL specimens only), is one component of a comprehensive MRSA colonization surveillance program. It is not intended to diagnose MRSA infection nor to guide or monitor treatment for MRSA infections. RESULT CALLED TO, READ BACK BY AND VERIFIED WITH:  JENNIFER COLES AT 1005 07/27/18 SDR Performed at [facility], [address] [place], [place] [address]   Aerobic/Anaerobic  Culture (surgical/deep wound)     Status: None (Preliminary result)   Collection Time: 07/28/18  8:49 AM  Result Value Ref Range Status   Specimen Description   Final    BONE RIGHT CALCANEUS Performed at [facility], [place] [address] [place], [place] [address]    Special Requests   Final    NONE Performed at [facility], [place], [place] [address]    Gram Stain   Final    RARE WBC PRESENT,BOTH PMN AND MONONUCLEAR NO ORGANISMS SEEN    Culture   Final    FEW PROTEUS MIRABILIS NO ANAEROBES ISOLATED; CULTURE IN PROGRESS FOR 5 DAYS CRITICAL RESULT CALLED TO, READ BACK BY AND VERIFIED WITH: I. DAVIS, RN ([place]) CONCERNING CULTURE GROWTH AT [place] ON 07/31/18 BY C. JESSUP, MLT.  Performed at [facility], [place] [address], [place],  [address]    Report Status PENDING  Incomplete   Organism ID, Bacteria PROTEUS MIRABILIS  Final      Susceptibility   Proteus mirabilis - MIC*    AMPICILLIN <=2 SENSITIVE Sensitive     CEFAZOLIN <=4 SENSITIVE Sensitive     CEFEPIME <=1 SENSITIVE Sensitive     CEFTAZIDIME <=1 SENSITIVE Sensitive     CEFTRIAXONE <=1 SENSITIVE Sensitive     CIPROFLOXACIN <=0.25 SENSITIVE Sensitive     GENTAMICIN <=1 SENSITIVE Sensitive     IMIPENEM 2 SENSITIVE Sensitive     TRIMETH/SULFA <=20 SENSITIVE Sensitive     AMPICILLIN/SULBACTAM <=2 SENSITIVE Sensitive     PIP/TAZO <=4 SENSITIVE Sensitive     * FEW PROTEUS MIRABILIS    Coagulation Studies: No results for input(s): LABPROT, INR in the last 72 hours.  Urinalysis: No results for input(s): COLORURINE, LABSPEC, PHURINE, GLUCOSEU, HGBUR, BILIRUBINUR, KETONESUR, PROTEINUR, UROBILINOGEN, NITRITE, LEUKOCYTESUR in the last 72 hours.  Invalid input(s): APPERANCEUR    Imaging: No results found.   Medications:    . allopurinol  100 mg Oral Daily  . aspirin EC  81 mg Oral Daily  . atorvastatin  10 mg Oral q1800  . carvedilol  3.125 mg Oral BID  . clopidogrel  75 mg Oral  Daily  . gabapentin  400 mg Oral TID  . heparin injection (subcutaneous)  5,000 Units Subcutaneous Q8H  . insulin aspart  0-9 Units Subcutaneous TID WC & HS  . insulin glargine  20 Units Subcutaneous QHS  . levofloxacin  750 mg Oral  Q48H  . linezolid  600 mg Oral Q12H  . mupirocin ointment   Nasal BID  . predniSONE  5 mg Oral Q breakfast   acetaminophen **OR** acetaminophen, oxyCODONE  Assessment/ Plan:  [name] is a 53 y.o. black male with diabetes mellitus type II, hypertension,coronary artery disease, congestive heart failure, ICD, carotid stenosis, CVA, obstructive sleep apnea, peripheral vascular disease, gout, who was admitted to [facility] on 07/25/2018 for Osteomyelitis rt Heel  1. Acute renal failure on chronic kidney disease stage IV: Creatinine 2.67, GFR of 30 on 05/29/18.  Chronic kidney disease secondary to diabetic nephropathy - not currently on an ACE-I/ARB-will consider outpatient once serum creatinine is considered to be stable -We will arrange for outpatient follow-up  2. Right heel osteomyelitis:  -Management as per podiatrist and follow ID recommendations for antibiotics  3. Hypertension with CKD:   - carvedilol and torsemide.  - Use torsemide as needed  4. Anemia of chronic kidney disease: Hemoglobin 7.9. Normocytic.  May need ESA therapy as outpatient.  Continue to monitor.  5. Diabetes mellitus type II with chronic kidney disease: insulin dependent. Hemoglobin A1c of 7.9%.  Complication of diabetic foot ulcer with osteomyelitis.    LOS: 8 Paul [name] 3/31/20209:40 AM

## 2018-08-02 NOTE — Discharge Summary (Signed)
[facility] [place] NAME: Paul [name]    MR#:  [id number]  DATE OF BIRTH:  17-Dec-1965  DATE OF ADMISSION:  07/25/2018 ADMITTING PHYSICIAN: Dustin Flock, MD  DATE OF DISCHARGE: 08/02/2018  PRIMARY CARE PHYSICIAN: Casilda Carls, MD    ADMISSION DIAGNOSIS:  Osteomyelitis rt Heel  DISCHARGE DIAGNOSIS:  Bilateral  heel nonhealing ulcer with possible right heel osteomyelitis PAD Dm-2 on insulin SECONDARY DIAGNOSIS:   Past Medical History:  Diagnosis Date  . Anemia   . Cardiac defibrillator in place    a. Biotronik LUmax 540 DRT, (ser # [id number]).  . Carotid arterial disease ([place])    a. s/p prior LICA stenting;  b. 11/2534 Carotid U/S: 40-59% bilat ICA stenosis. Patent LICA stent.  . CHF (congestive heart failure) ([place])   . CKD (chronic kidney disease), stage III ([place])   . Coronary artery disease    a. 2010 s/p CABG x 3.  . DDD (degenerative disc disease), lumbosacral    L5-S1  . Diabetes ([place])    Lantus at bedtime  . Gangrene of toe of right foot ([place])   . Gout of left hand 10/06/2016  . HFrEF (heart failure with reduced ejection fraction) ([place])    a. 07/2016 Echo: EF 25-30%, diff HK, mild MR, mildly dil LA, mod reduced RV fxn, PASP 31mmHg.  Marland Kitchen Hypertension    takes Coreg daily  . Ischemic cardiomyopathy    a. 07/2016 Echo: EF 25-30%, diff HK.  . Myocardial infarction ([place]) 2009  . Peripheral vascular disease ([place])   . Sleep apnea    sleep study yr ago-unable to afford cpap  . Stroke [facility]) 09   no weakness    HOSPITAL COURSE:   53 year old male with past medical history of stroke, carotid stenosis status post left carotid stent, CAD status post CABG, ischemic cardiomyopathy status post defibrillator, A. fib on Eliquis, diabetes, nonhealing diabetic heel ulcers presents to hospital secondary to worsening right heel ulceration.  1.Bilateral  heel nonhealing ulcer with possible right heel osteomyelitis-seen by podiatry  Dr troxler - continue wound VAC - Patient does NOT qualify to go to rehab -Outpatient cultures growing MRSA and Proteus -Bone culture from 3/26--Proteus mirabilis -ID and podiatry consult appreciated -s/p bilateral foot surgery 1.Excisional debridement of right heel ulcer with the versa jet to include removal of infected and necrotic skin soft tissue tendon and removal of bone. A small portion of bone was removed the posterior calcaneus. Wound is 5.1 cm long and 6.2 cm wide with full-thickness depth down to the bone. 2. Excisional debridement of necrotic skin and subcutaneous tissue from the left heel ulcer. Wound is 3.5 cm in length and 3.7 cm in width with depth down to the fatty subcutaneous tissue -CT on admission of the right ankle and calcaneum showing mild new cortical bone loss changes concerning for evolving osteomyelitis. -Status post angiogram with angioplasty by dr Lucky Cowboy for PAD -on po eliquis (per pt) -Continue Unasyn, Zyvox--changed to po levaquin till April 13th and zyvox till April 6th  2. Diabetes mellitus-continue Lantus, sliding scale insulin. Blood sugar well controlled. -On gabapentin for diabetic neuropathy  3. Acute renal failure on CKD stage 4: cr at baseline. Not currently on ACE inhibitor's, renal ultrasounddid not show any acute obstruction,seen by nephrology, monitor kidney function closely, creatinine 3.67 today started on torsemide 40 mg daily by nephrology. -future HD discussed by nephrology -appears 3.5 is a new baseline  4. CAD-stable. Status post CABG  in 2010 and ICD placement. Recently battery change done last year. -Continue aspirin, Coreg  5. DVT prophylaxis-Heparin  Pt reports taking po prednisone for his joint pain which he tells me PCP is prescribing it. I have asked him to f/u PCP  D/c home with HHPT/RN./SW/aide Pt is REFUSING wheelchair   CODE STATUS: full code  CONSULTS OBTAINED:  Treatment Team:  Tsosie Billing, MD Dustin Flock, MD  DRUG ALLERGIES:   Allergies  Allergen Reactions  . Ibuprofen Other (See Comments)    Heart problems  . Baclofen Other (See Comments)  . Metformin Diarrhea  . Nsaids     Due to kidney and heart problems per pt    DISCHARGE MEDICATIONS:   Allergies as of 08/02/2018      Reactions   Ibuprofen Other (See Comments)   Heart problems   Baclofen Other (See Comments)   Metformin Diarrhea   Nsaids    Due to kidney and heart problems per pt      Medication List    STOP taking these medications   Oxycodone HCl 10 MG Tabs   potassium chloride SA 20 MEQ tablet Commonly known as:  K-DUR,KLOR-CON   torsemide 20 MG tablet Commonly known as:  DEMADEX     TAKE these medications   acetaminophen 325 MG tablet Commonly known as:  TYLENOL Take 2 tablets (650 mg total) by mouth every 6 (six) hours as needed for mild pain (or Fever >/= 101).   allopurinol 100 MG tablet Commonly known as:  ZYLOPRIM Take 1 tablet (100 mg total) by mouth daily.   apixaban 5 MG Tabs tablet Commonly known as:  ELIQUIS Take 1 tablet (5 mg total) by mouth 2 (two) times daily.   aspirin EC 81 MG tablet Take 1 tablet (81 mg total) by mouth daily.   carvedilol 3.125 MG tablet Commonly known as:  COREG Take 1 tablet (3.125 mg total) by mouth 2 (two) times daily.   gabapentin 100 MG capsule Commonly known as:  NEURONTIN Take 200 mg by mouth 3 (three) times daily.   insulin glargine 100 unit/mL Sopn Commonly known as:  LANTUS Inject 0.2 mLs (20 Units total) into the skin at bedtime.   levofloxacin 750 MG tablet Commonly known as:  LEVAQUIN Take 1 tablet (750 mg total) by mouth every other day for 12 days. Start taking on:  August 03, 2018   linezolid 600 MG tablet Commonly known as:  ZYVOX Take 1 tablet (600 mg total) by mouth every 12 (twelve) hours for 6 days.   predniSONE 5 MG tablet Commonly known as:  DELTASONE Take 1 tablet (5 mg total) by mouth daily with  breakfast. What changed:    how much to take  how to take this  when to take this  additional instructions   Vitamin D3 1.25 MG (50000 UT) Caps Take 1 capsule by mouth once a week.       If you experience worsening of your admission symptoms, develop shortness of breath, life threatening emergency, suicidal or homicidal thoughts you must seek medical attention immediately by calling 911 or calling your MD immediately  if symptoms less severe.  You Must read complete instructions/literature along with all the possible adverse reactions/side effects for all the Medicines you take and that have been prescribed to you. Take any new Medicines after you have completely understood and accept all the possible adverse reactions/side effects.   Please note  You were cared for by a hospitalist during your  hospital stay. If you have any questions about your discharge medications or the care you received while you were in the hospital after you are discharged, you can call the unit and asked to speak with the hospitalist on call if the hospitalist that took care of you is not available. Once you are discharged, your primary care physician will handle any further medical issues. Please note that NO REFILLS for any discharge medications will be authorized once you are discharged, as it is imperative that you return to your primary care physician (or establish a relationship with a primary care physician if you do not have one) for your aftercare needs so that they can reassess your need for medications and monitor your lab values. Today   SUBJECTIVE   No new complaints  VITAL SIGNS:  Blood pressure (!) 165/93, pulse 89, temperature 98 F (36.7 C), temperature source Oral, resp. rate 15, height 5\' 10"  (1.778 m), weight 94.8 kg, SpO2 98 %.  I/O:    Intake/Output Summary (Last 24 hours) at 08/02/2018 0918 Last data filed at 08/02/2018 4270 Gross per 24 hour  Intake -  Output 920 ml  Net -920 ml     PHYSICAL EXAMINATION:  GENERAL:  53 y.o.-year-old patient lying in the bed with no acute distress.  EYES: Pupils equal, round, reactive to light and accommodation. No scleral icterus. Extraocular muscles intact.  HEENT: Head atraumatic, normocephalic. Oropharynx and nasopharynx clear.  NECK:  Supple, no jugular venous distention. No thyroid enlargement, no tenderness.  LUNGS: Normal breath sounds bilaterally, no wheezing, rales,rhonchi or crepitation. No use of accessory muscles of respiration.  CARDIOVASCULAR: S1, S2 normal. No murmurs, rubs, or gallops.  ABDOMEN: Soft, non-tender, non-distended. Bowel sounds present. No organomegaly or mass.  EXTREMITIES: bilateral feet dressing+ heel protectors + NEUROLOGIC: Cranial nerves II through XII are intact. Muscle strength 5/5 in all extremities. Sensation intact. Gait not checked.  PSYCHIATRIC: The patient is alert and oriented x 3.  SKIN: No obvious rash, lesion, or ulcer.   DATA REVIEW:   CBC  Recent Labs  Lab 08/01/18 0326  WBC 15.0*  HGB 7.9*  HCT 26.3*  PLT 354    Chemistries  Recent Labs  Lab 07/30/18 0404  08/01/18 0326  NA 135   < > 137  K 4.7   < > 4.7  CL 99   < > 99  CO2 26   < > 26  GLUCOSE 157*   < > 130*  BUN 76*   < > 79*  CREATININE 3.58*  3.57*   < > 3.58*  CALCIUM 8.3*   < > 8.7*  AST 13*  --   --   ALT 11  --   --   ALKPHOS 82  --   --   BILITOT 0.4  --   --    < > = values in this interval not displayed.    Microbiology Results   Recent Results (from the past 240 hour(s))  Culture, blood (routine x 2)     Status: None   Collection Time: 07/25/18  5:58 PM  Result Value Ref Range Status   Specimen Description BLOOD LEFT ANTECUBITAL  Final   Special Requests   Final    BOTTLES DRAWN AEROBIC AND ANAEROBIC Blood Culture adequate volume   Culture   Final    NO GROWTH 5 DAYS Performed at [facility], [address], [place], [place] [address]    Report Status 07/30/2018 FINAL  Final  Culture, blood (routine x 2)     Status: None   Collection Time: 07/25/18  6:11 PM  Result Value Ref Range Status   Specimen Description BLOOD BLOOD RIGHT FOREARM  Final   Special Requests   Final    BOTTLES DRAWN AEROBIC AND ANAEROBIC Blood Culture adequate volume   Culture   Final    NO GROWTH 5 DAYS Performed at [facility], [address] [place], [place] [address]    Report Status 07/30/2018 FINAL  Final  MRSA PCR Screening     Status: Abnormal   Collection Time: 07/27/18  8:32 AM  Result Value Ref Range Status   MRSA by PCR POSITIVE (A) NEGATIVE Final    Comment:        The GeneXpert MRSA Assay (FDA approved for NASAL specimens only), is one component of a comprehensive MRSA colonization surveillance program. It is not intended to diagnose MRSA infection nor to guide or monitor treatment for MRSA infections. RESULT CALLED TO, READ BACK BY AND VERIFIED WITH:  JENNIFER COLES AT 1005 07/27/18 SDR Performed at [facility], [address] [place], [place] [address]   Aerobic/Anaerobic Culture (surgical/deep wound)     Status: None (Preliminary result)   Collection Time: 07/28/18  8:49 AM  Result Value Ref Range Status   Specimen Description   Final    BONE RIGHT CALCANEUS Performed at [facility], [place] [address], [place], [place] [address]    Special Requests   Final    NONE Performed at [facility], [place], [place] [address]    Gram Stain   Final    RARE WBC PRESENT,BOTH PMN AND MONONUCLEAR NO ORGANISMS SEEN    Culture   Final    FEW PROTEUS MIRABILIS NO ANAEROBES ISOLATED; CULTURE IN PROGRESS FOR 5 DAYS CRITICAL RESULT CALLED TO, READ BACK BY AND VERIFIED WITH: I. DAVIS, RN ([place]) CONCERNING CULTURE GROWTH AT [place] ON 07/31/18 BY C. JESSUP, MLT.  Performed at [facility], [place] [address] [place],  [address]    Report Status PENDING  Incomplete   Organism ID, Bacteria PROTEUS MIRABILIS  Final       Susceptibility   Proteus mirabilis - MIC*    AMPICILLIN <=2 SENSITIVE Sensitive     CEFAZOLIN <=4 SENSITIVE Sensitive     CEFEPIME <=1 SENSITIVE Sensitive     CEFTAZIDIME <=1 SENSITIVE Sensitive     CEFTRIAXONE <=1 SENSITIVE Sensitive     CIPROFLOXACIN <=0.25 SENSITIVE Sensitive     GENTAMICIN <=1 SENSITIVE Sensitive     IMIPENEM 2 SENSITIVE Sensitive     TRIMETH/SULFA <=20 SENSITIVE Sensitive     AMPICILLIN/SULBACTAM <=2 SENSITIVE Sensitive     PIP/TAZO <=4 SENSITIVE Sensitive     * FEW PROTEUS MIRABILIS    RADIOLOGY:  No results found.   CODE STATUS:     Code Status Orders  (From admission, onward)         Start     Ordered   07/25/18 1718  Full code  Continuous     07/25/18 1717        Code Status History    Date Active Date Inactive Code Status Order ID Comments User Context   05/25/2018 2482 05/29/2018 2022 Full Code [id number]  Hillary Bow, MD ED   10/09/2016 1003 10/09/2016 1340 Full Code [id number]  Samara Deist, [facility] Inpatient   08/22/2016 0042 08/27/2016 1649 Full Code [id number]  Lance Coon, MD Inpatient  07/18/2014 1818 07/20/2014 2002 Full Code [id number]  Leighton Parody, PA-C Inpatient      TOTAL TIME TAKING CARE OF THIS PATIENT: *40* minutes.    Fritzi Mandes M.D on 08/02/2018 at 9:18 AM  Between 7am to 6pm - Pager - [phone] After 6pm go to [email or web address] - password [email or web address] [place] [facility]  Office  [phone]  CC: Primary care physician; Casilda Carls, MD

## 2018-08-02 NOTE — Discharge Instructions (Signed)
Wound vac instruction and dressing instructions per dr troxler

## 2018-08-02 NOTE — TOC Progression Note (Signed)
Transition of Care (TOC) - Progression Note    Patient Details  Name: Deray Dawes. MRN: [id number] Date of Birth: Mar 08, 1966  Transition of Care [facility]) CM/SW Contact  Katrina Stack, RN Phone Number: 08/02/2018, 8:14 AM  Clinical Narrative:   [facility] is speaking with their leadership about accepting the patient back under service due to lack of caregiver concerns.  With wound vac on both heels, there are concerns for how patient will get around and he refused wheelchair.  CM informed [facility] patient says he has adequate caregiver support now. May need to address need for wheelchair again with patient. Informed [facility] of need for RN PT OT aide and SW. SW needed to assist with application for part d medicare and completion of medicare SHIP. Will need to assess discharge meds for affordability    Expected Discharge Plan: [place] Barriers to Discharge: No Barriers Identified  Expected Discharge Plan and Services Expected Discharge Plan: [place] arrangements for the past 2 months: Single Family Home Expected Discharge Date: 08/01/18                   HH Arranged: RN, PT, OT, Nurse's Aide, Social Work [employer] Agency: Microbiologist ([place])   Social Determinants of Health (SDOH) Interventions    Readmission Risk Interventions No flowsheet data found.

## 2018-08-02 NOTE — Progress Notes (Signed)
Per Dr. Elvina Mattes conversation, ok not to change wound vac dressing. Will be ok until [facility] comes on 4/1. Send supplies home

## 2018-08-03 DIAGNOSIS — Z4800 Encounter for change or removal of nonsurgical wound dressing: Secondary | ICD-10-CM | POA: Diagnosis not present

## 2018-08-03 DIAGNOSIS — L89613 Pressure ulcer of right heel, stage 3: Secondary | ICD-10-CM | POA: Diagnosis not present

## 2018-08-03 DIAGNOSIS — L89623 Pressure ulcer of left heel, stage 3: Secondary | ICD-10-CM | POA: Diagnosis not present

## 2018-08-03 DIAGNOSIS — Z89421 Acquired absence of other right toe(s): Secondary | ICD-10-CM | POA: Diagnosis not present

## 2018-08-03 DIAGNOSIS — E1151 Type 2 diabetes mellitus with diabetic peripheral angiopathy without gangrene: Secondary | ICD-10-CM | POA: Diagnosis not present

## 2018-08-03 DIAGNOSIS — B964 Proteus (mirabilis) (morganii) as the cause of diseases classified elsewhere: Secondary | ICD-10-CM | POA: Diagnosis not present

## 2018-08-05 DIAGNOSIS — B964 Proteus (mirabilis) (morganii) as the cause of diseases classified elsewhere: Secondary | ICD-10-CM | POA: Diagnosis not present

## 2018-08-05 DIAGNOSIS — L89623 Pressure ulcer of left heel, stage 3: Secondary | ICD-10-CM | POA: Diagnosis not present

## 2018-08-05 DIAGNOSIS — E1151 Type 2 diabetes mellitus with diabetic peripheral angiopathy without gangrene: Secondary | ICD-10-CM | POA: Diagnosis not present

## 2018-08-05 DIAGNOSIS — Z89421 Acquired absence of other right toe(s): Secondary | ICD-10-CM | POA: Diagnosis not present

## 2018-08-05 DIAGNOSIS — L89613 Pressure ulcer of right heel, stage 3: Secondary | ICD-10-CM | POA: Diagnosis not present

## 2018-08-05 DIAGNOSIS — Z4800 Encounter for change or removal of nonsurgical wound dressing: Secondary | ICD-10-CM | POA: Diagnosis not present

## 2018-08-08 DIAGNOSIS — L89613 Pressure ulcer of right heel, stage 3: Secondary | ICD-10-CM | POA: Diagnosis not present

## 2018-08-08 DIAGNOSIS — Z4800 Encounter for change or removal of nonsurgical wound dressing: Secondary | ICD-10-CM | POA: Diagnosis not present

## 2018-08-08 DIAGNOSIS — E1151 Type 2 diabetes mellitus with diabetic peripheral angiopathy without gangrene: Secondary | ICD-10-CM | POA: Diagnosis not present

## 2018-08-08 DIAGNOSIS — B964 Proteus (mirabilis) (morganii) as the cause of diseases classified elsewhere: Secondary | ICD-10-CM | POA: Diagnosis not present

## 2018-08-08 DIAGNOSIS — Z89421 Acquired absence of other right toe(s): Secondary | ICD-10-CM | POA: Diagnosis not present

## 2018-08-08 DIAGNOSIS — L89623 Pressure ulcer of left heel, stage 3: Secondary | ICD-10-CM | POA: Diagnosis not present

## 2018-08-09 DIAGNOSIS — L89613 Pressure ulcer of right heel, stage 3: Secondary | ICD-10-CM | POA: Diagnosis not present

## 2018-08-09 DIAGNOSIS — L89623 Pressure ulcer of left heel, stage 3: Secondary | ICD-10-CM | POA: Diagnosis not present

## 2018-08-10 DIAGNOSIS — L89623 Pressure ulcer of left heel, stage 3: Secondary | ICD-10-CM | POA: Diagnosis not present

## 2018-08-10 DIAGNOSIS — Z89421 Acquired absence of other right toe(s): Secondary | ICD-10-CM | POA: Diagnosis not present

## 2018-08-10 DIAGNOSIS — L89613 Pressure ulcer of right heel, stage 3: Secondary | ICD-10-CM | POA: Diagnosis not present

## 2018-08-10 DIAGNOSIS — E1151 Type 2 diabetes mellitus with diabetic peripheral angiopathy without gangrene: Secondary | ICD-10-CM | POA: Diagnosis not present

## 2018-08-10 DIAGNOSIS — Z4800 Encounter for change or removal of nonsurgical wound dressing: Secondary | ICD-10-CM | POA: Diagnosis not present

## 2018-08-10 DIAGNOSIS — B964 Proteus (mirabilis) (morganii) as the cause of diseases classified elsewhere: Secondary | ICD-10-CM | POA: Diagnosis not present

## 2018-08-12 DIAGNOSIS — Z4800 Encounter for change or removal of nonsurgical wound dressing: Secondary | ICD-10-CM | POA: Diagnosis not present

## 2018-08-12 DIAGNOSIS — L89613 Pressure ulcer of right heel, stage 3: Secondary | ICD-10-CM | POA: Diagnosis not present

## 2018-08-12 DIAGNOSIS — Z89421 Acquired absence of other right toe(s): Secondary | ICD-10-CM | POA: Diagnosis not present

## 2018-08-12 DIAGNOSIS — L89623 Pressure ulcer of left heel, stage 3: Secondary | ICD-10-CM | POA: Diagnosis not present

## 2018-08-12 DIAGNOSIS — E1151 Type 2 diabetes mellitus with diabetic peripheral angiopathy without gangrene: Secondary | ICD-10-CM | POA: Diagnosis not present

## 2018-08-12 DIAGNOSIS — B964 Proteus (mirabilis) (morganii) as the cause of diseases classified elsewhere: Secondary | ICD-10-CM | POA: Diagnosis not present

## 2018-08-15 DIAGNOSIS — B964 Proteus (mirabilis) (morganii) as the cause of diseases classified elsewhere: Secondary | ICD-10-CM | POA: Diagnosis not present

## 2018-08-15 DIAGNOSIS — L89623 Pressure ulcer of left heel, stage 3: Secondary | ICD-10-CM | POA: Diagnosis not present

## 2018-08-15 DIAGNOSIS — L89613 Pressure ulcer of right heel, stage 3: Secondary | ICD-10-CM | POA: Diagnosis not present

## 2018-08-15 DIAGNOSIS — Z4800 Encounter for change or removal of nonsurgical wound dressing: Secondary | ICD-10-CM | POA: Diagnosis not present

## 2018-08-15 DIAGNOSIS — E1151 Type 2 diabetes mellitus with diabetic peripheral angiopathy without gangrene: Secondary | ICD-10-CM | POA: Diagnosis not present

## 2018-08-15 DIAGNOSIS — Z89421 Acquired absence of other right toe(s): Secondary | ICD-10-CM | POA: Diagnosis not present

## 2018-08-16 ENCOUNTER — Encounter: Payer: Self-pay | Admitting: Podiatry

## 2018-08-17 DIAGNOSIS — E1151 Type 2 diabetes mellitus with diabetic peripheral angiopathy without gangrene: Secondary | ICD-10-CM | POA: Diagnosis not present

## 2018-08-17 DIAGNOSIS — Z4800 Encounter for change or removal of nonsurgical wound dressing: Secondary | ICD-10-CM | POA: Diagnosis not present

## 2018-08-17 DIAGNOSIS — L89613 Pressure ulcer of right heel, stage 3: Secondary | ICD-10-CM | POA: Diagnosis not present

## 2018-08-17 DIAGNOSIS — L89623 Pressure ulcer of left heel, stage 3: Secondary | ICD-10-CM | POA: Diagnosis not present

## 2018-08-17 DIAGNOSIS — B964 Proteus (mirabilis) (morganii) as the cause of diseases classified elsewhere: Secondary | ICD-10-CM | POA: Diagnosis not present

## 2018-08-17 DIAGNOSIS — Z89421 Acquired absence of other right toe(s): Secondary | ICD-10-CM | POA: Diagnosis not present

## 2018-08-18 DIAGNOSIS — Z89421 Acquired absence of other right toe(s): Secondary | ICD-10-CM | POA: Diagnosis not present

## 2018-08-18 DIAGNOSIS — B964 Proteus (mirabilis) (morganii) as the cause of diseases classified elsewhere: Secondary | ICD-10-CM | POA: Diagnosis not present

## 2018-08-18 DIAGNOSIS — Z4800 Encounter for change or removal of nonsurgical wound dressing: Secondary | ICD-10-CM | POA: Diagnosis not present

## 2018-08-18 DIAGNOSIS — E1151 Type 2 diabetes mellitus with diabetic peripheral angiopathy without gangrene: Secondary | ICD-10-CM | POA: Diagnosis not present

## 2018-08-18 DIAGNOSIS — L89613 Pressure ulcer of right heel, stage 3: Secondary | ICD-10-CM | POA: Diagnosis not present

## 2018-08-18 DIAGNOSIS — L89623 Pressure ulcer of left heel, stage 3: Secondary | ICD-10-CM | POA: Diagnosis not present

## 2018-08-19 DIAGNOSIS — Z4800 Encounter for change or removal of nonsurgical wound dressing: Secondary | ICD-10-CM | POA: Diagnosis not present

## 2018-08-19 DIAGNOSIS — Z89421 Acquired absence of other right toe(s): Secondary | ICD-10-CM | POA: Diagnosis not present

## 2018-08-19 DIAGNOSIS — E1151 Type 2 diabetes mellitus with diabetic peripheral angiopathy without gangrene: Secondary | ICD-10-CM | POA: Diagnosis not present

## 2018-08-19 DIAGNOSIS — L89623 Pressure ulcer of left heel, stage 3: Secondary | ICD-10-CM | POA: Diagnosis not present

## 2018-08-19 DIAGNOSIS — L89613 Pressure ulcer of right heel, stage 3: Secondary | ICD-10-CM | POA: Diagnosis not present

## 2018-08-19 DIAGNOSIS — B964 Proteus (mirabilis) (morganii) as the cause of diseases classified elsewhere: Secondary | ICD-10-CM | POA: Diagnosis not present

## 2018-08-22 DIAGNOSIS — E1151 Type 2 diabetes mellitus with diabetic peripheral angiopathy without gangrene: Secondary | ICD-10-CM | POA: Diagnosis not present

## 2018-08-22 DIAGNOSIS — Z4800 Encounter for change or removal of nonsurgical wound dressing: Secondary | ICD-10-CM | POA: Diagnosis not present

## 2018-08-22 DIAGNOSIS — B964 Proteus (mirabilis) (morganii) as the cause of diseases classified elsewhere: Secondary | ICD-10-CM | POA: Diagnosis not present

## 2018-08-22 DIAGNOSIS — L89623 Pressure ulcer of left heel, stage 3: Secondary | ICD-10-CM | POA: Diagnosis not present

## 2018-08-22 DIAGNOSIS — Z89421 Acquired absence of other right toe(s): Secondary | ICD-10-CM | POA: Diagnosis not present

## 2018-08-22 DIAGNOSIS — L89613 Pressure ulcer of right heel, stage 3: Secondary | ICD-10-CM | POA: Diagnosis not present

## 2018-08-24 DIAGNOSIS — B964 Proteus (mirabilis) (morganii) as the cause of diseases classified elsewhere: Secondary | ICD-10-CM | POA: Diagnosis not present

## 2018-08-24 DIAGNOSIS — Z4800 Encounter for change or removal of nonsurgical wound dressing: Secondary | ICD-10-CM | POA: Diagnosis not present

## 2018-08-24 DIAGNOSIS — E1142 Type 2 diabetes mellitus with diabetic polyneuropathy: Secondary | ICD-10-CM | POA: Diagnosis not present

## 2018-08-24 DIAGNOSIS — L97513 Non-pressure chronic ulcer of other part of right foot with necrosis of muscle: Secondary | ICD-10-CM | POA: Diagnosis not present

## 2018-08-24 DIAGNOSIS — E1151 Type 2 diabetes mellitus with diabetic peripheral angiopathy without gangrene: Secondary | ICD-10-CM | POA: Diagnosis not present

## 2018-08-24 DIAGNOSIS — Z89421 Acquired absence of other right toe(s): Secondary | ICD-10-CM | POA: Diagnosis not present

## 2018-08-24 DIAGNOSIS — L89613 Pressure ulcer of right heel, stage 3: Secondary | ICD-10-CM | POA: Diagnosis not present

## 2018-08-24 DIAGNOSIS — L89623 Pressure ulcer of left heel, stage 3: Secondary | ICD-10-CM | POA: Diagnosis not present

## 2018-08-26 DIAGNOSIS — E1151 Type 2 diabetes mellitus with diabetic peripheral angiopathy without gangrene: Secondary | ICD-10-CM | POA: Diagnosis not present

## 2018-08-26 DIAGNOSIS — L89623 Pressure ulcer of left heel, stage 3: Secondary | ICD-10-CM | POA: Diagnosis not present

## 2018-08-26 DIAGNOSIS — B964 Proteus (mirabilis) (morganii) as the cause of diseases classified elsewhere: Secondary | ICD-10-CM | POA: Diagnosis not present

## 2018-08-26 DIAGNOSIS — Z89421 Acquired absence of other right toe(s): Secondary | ICD-10-CM | POA: Diagnosis not present

## 2018-08-26 DIAGNOSIS — L89613 Pressure ulcer of right heel, stage 3: Secondary | ICD-10-CM | POA: Diagnosis not present

## 2018-08-26 DIAGNOSIS — Z4800 Encounter for change or removal of nonsurgical wound dressing: Secondary | ICD-10-CM | POA: Diagnosis not present

## 2018-08-29 DIAGNOSIS — L89623 Pressure ulcer of left heel, stage 3: Secondary | ICD-10-CM | POA: Diagnosis not present

## 2018-08-29 DIAGNOSIS — L89613 Pressure ulcer of right heel, stage 3: Secondary | ICD-10-CM | POA: Diagnosis not present

## 2018-08-29 DIAGNOSIS — Z4800 Encounter for change or removal of nonsurgical wound dressing: Secondary | ICD-10-CM | POA: Diagnosis not present

## 2018-08-29 DIAGNOSIS — Z89421 Acquired absence of other right toe(s): Secondary | ICD-10-CM | POA: Diagnosis not present

## 2018-08-29 DIAGNOSIS — B964 Proteus (mirabilis) (morganii) as the cause of diseases classified elsewhere: Secondary | ICD-10-CM | POA: Diagnosis not present

## 2018-08-29 DIAGNOSIS — E1151 Type 2 diabetes mellitus with diabetic peripheral angiopathy without gangrene: Secondary | ICD-10-CM | POA: Diagnosis not present

## 2018-08-30 DIAGNOSIS — I255 Ischemic cardiomyopathy: Secondary | ICD-10-CM | POA: Diagnosis not present

## 2018-08-30 DIAGNOSIS — Z9119 Patient's noncompliance with other medical treatment and regimen: Secondary | ICD-10-CM | POA: Diagnosis not present

## 2018-08-30 DIAGNOSIS — Z9989 Dependence on other enabling machines and devices: Secondary | ICD-10-CM | POA: Diagnosis not present

## 2018-08-30 DIAGNOSIS — Z951 Presence of aortocoronary bypass graft: Secondary | ICD-10-CM | POA: Diagnosis not present

## 2018-08-30 DIAGNOSIS — G4733 Obstructive sleep apnea (adult) (pediatric): Secondary | ICD-10-CM | POA: Diagnosis not present

## 2018-08-30 DIAGNOSIS — I483 Typical atrial flutter: Secondary | ICD-10-CM | POA: Diagnosis not present

## 2018-08-30 DIAGNOSIS — N184 Chronic kidney disease, stage 4 (severe): Secondary | ICD-10-CM | POA: Diagnosis not present

## 2018-08-30 DIAGNOSIS — I499 Cardiac arrhythmia, unspecified: Secondary | ICD-10-CM | POA: Insufficient documentation

## 2018-08-30 DIAGNOSIS — I2510 Atherosclerotic heart disease of native coronary artery without angina pectoris: Secondary | ICD-10-CM | POA: Diagnosis not present

## 2018-08-30 DIAGNOSIS — Z7982 Long term (current) use of aspirin: Secondary | ICD-10-CM | POA: Diagnosis not present

## 2018-08-30 DIAGNOSIS — I5022 Chronic systolic (congestive) heart failure: Secondary | ICD-10-CM | POA: Diagnosis not present

## 2018-08-30 DIAGNOSIS — I130 Hypertensive heart and chronic kidney disease with heart failure and stage 1 through stage 4 chronic kidney disease, or unspecified chronic kidney disease: Secondary | ICD-10-CM | POA: Diagnosis not present

## 2018-08-31 DIAGNOSIS — Z8739 Personal history of other diseases of the musculoskeletal system and connective tissue: Secondary | ICD-10-CM | POA: Diagnosis not present

## 2018-08-31 DIAGNOSIS — I4892 Unspecified atrial flutter: Secondary | ICD-10-CM | POA: Diagnosis not present

## 2018-08-31 DIAGNOSIS — I5022 Chronic systolic (congestive) heart failure: Secondary | ICD-10-CM | POA: Diagnosis not present

## 2018-08-31 DIAGNOSIS — Z125 Encounter for screening for malignant neoplasm of prostate: Secondary | ICD-10-CM | POA: Diagnosis not present

## 2018-08-31 DIAGNOSIS — E1122 Type 2 diabetes mellitus with diabetic chronic kidney disease: Secondary | ICD-10-CM | POA: Diagnosis not present

## 2018-08-31 DIAGNOSIS — Z4800 Encounter for change or removal of nonsurgical wound dressing: Secondary | ICD-10-CM | POA: Diagnosis not present

## 2018-08-31 DIAGNOSIS — L089 Local infection of the skin and subcutaneous tissue, unspecified: Secondary | ICD-10-CM | POA: Diagnosis not present

## 2018-08-31 DIAGNOSIS — I255 Ischemic cardiomyopathy: Secondary | ICD-10-CM | POA: Diagnosis not present

## 2018-08-31 DIAGNOSIS — G4733 Obstructive sleep apnea (adult) (pediatric): Secondary | ICD-10-CM | POA: Diagnosis not present

## 2018-08-31 DIAGNOSIS — E1151 Type 2 diabetes mellitus with diabetic peripheral angiopathy without gangrene: Secondary | ICD-10-CM | POA: Diagnosis not present

## 2018-08-31 DIAGNOSIS — B964 Proteus (mirabilis) (morganii) as the cause of diseases classified elsewhere: Secondary | ICD-10-CM | POA: Diagnosis not present

## 2018-08-31 DIAGNOSIS — E1165 Type 2 diabetes mellitus with hyperglycemia: Secondary | ICD-10-CM | POA: Diagnosis not present

## 2018-08-31 DIAGNOSIS — D649 Anemia, unspecified: Secondary | ICD-10-CM | POA: Diagnosis not present

## 2018-08-31 DIAGNOSIS — Z794 Long term (current) use of insulin: Secondary | ICD-10-CM | POA: Diagnosis not present

## 2018-08-31 DIAGNOSIS — L89623 Pressure ulcer of left heel, stage 3: Secondary | ICD-10-CM | POA: Diagnosis not present

## 2018-08-31 DIAGNOSIS — Z951 Presence of aortocoronary bypass graft: Secondary | ICD-10-CM | POA: Diagnosis not present

## 2018-08-31 DIAGNOSIS — I2510 Atherosclerotic heart disease of native coronary artery without angina pectoris: Secondary | ICD-10-CM | POA: Diagnosis not present

## 2018-08-31 DIAGNOSIS — L89613 Pressure ulcer of right heel, stage 3: Secondary | ICD-10-CM | POA: Diagnosis not present

## 2018-08-31 DIAGNOSIS — Z7689 Persons encountering health services in other specified circumstances: Secondary | ICD-10-CM | POA: Diagnosis not present

## 2018-08-31 DIAGNOSIS — R6889 Other general symptoms and signs: Secondary | ICD-10-CM | POA: Diagnosis not present

## 2018-08-31 DIAGNOSIS — N184 Chronic kidney disease, stage 4 (severe): Secondary | ICD-10-CM | POA: Diagnosis not present

## 2018-08-31 DIAGNOSIS — E11628 Type 2 diabetes mellitus with other skin complications: Secondary | ICD-10-CM | POA: Diagnosis not present

## 2018-08-31 DIAGNOSIS — Z89421 Acquired absence of other right toe(s): Secondary | ICD-10-CM | POA: Diagnosis not present

## 2018-08-31 DIAGNOSIS — R079 Chest pain, unspecified: Secondary | ICD-10-CM | POA: Diagnosis not present

## 2018-09-01 DIAGNOSIS — Z7901 Long term (current) use of anticoagulants: Secondary | ICD-10-CM | POA: Diagnosis not present

## 2018-09-01 DIAGNOSIS — Z4800 Encounter for change or removal of nonsurgical wound dressing: Secondary | ICD-10-CM | POA: Diagnosis not present

## 2018-09-01 DIAGNOSIS — Z89421 Acquired absence of other right toe(s): Secondary | ICD-10-CM | POA: Diagnosis not present

## 2018-09-01 DIAGNOSIS — B964 Proteus (mirabilis) (morganii) as the cause of diseases classified elsewhere: Secondary | ICD-10-CM | POA: Diagnosis not present

## 2018-09-01 DIAGNOSIS — E1151 Type 2 diabetes mellitus with diabetic peripheral angiopathy without gangrene: Secondary | ICD-10-CM | POA: Diagnosis not present

## 2018-09-01 DIAGNOSIS — L89613 Pressure ulcer of right heel, stage 3: Secondary | ICD-10-CM | POA: Diagnosis not present

## 2018-09-01 DIAGNOSIS — Z794 Long term (current) use of insulin: Secondary | ICD-10-CM | POA: Diagnosis not present

## 2018-09-01 DIAGNOSIS — Z7952 Long term (current) use of systemic steroids: Secondary | ICD-10-CM | POA: Diagnosis not present

## 2018-09-01 DIAGNOSIS — L89623 Pressure ulcer of left heel, stage 3: Secondary | ICD-10-CM | POA: Diagnosis not present

## 2018-09-02 DIAGNOSIS — L89623 Pressure ulcer of left heel, stage 3: Secondary | ICD-10-CM | POA: Diagnosis not present

## 2018-09-02 DIAGNOSIS — Z4800 Encounter for change or removal of nonsurgical wound dressing: Secondary | ICD-10-CM | POA: Diagnosis not present

## 2018-09-02 DIAGNOSIS — B964 Proteus (mirabilis) (morganii) as the cause of diseases classified elsewhere: Secondary | ICD-10-CM | POA: Diagnosis not present

## 2018-09-02 DIAGNOSIS — E1151 Type 2 diabetes mellitus with diabetic peripheral angiopathy without gangrene: Secondary | ICD-10-CM | POA: Diagnosis not present

## 2018-09-02 DIAGNOSIS — L89613 Pressure ulcer of right heel, stage 3: Secondary | ICD-10-CM | POA: Diagnosis not present

## 2018-09-02 DIAGNOSIS — Z89421 Acquired absence of other right toe(s): Secondary | ICD-10-CM | POA: Diagnosis not present

## 2018-09-03 DIAGNOSIS — E1151 Type 2 diabetes mellitus with diabetic peripheral angiopathy without gangrene: Secondary | ICD-10-CM | POA: Diagnosis not present

## 2018-09-03 DIAGNOSIS — L89613 Pressure ulcer of right heel, stage 3: Secondary | ICD-10-CM | POA: Diagnosis not present

## 2018-09-03 DIAGNOSIS — B964 Proteus (mirabilis) (morganii) as the cause of diseases classified elsewhere: Secondary | ICD-10-CM | POA: Diagnosis not present

## 2018-09-03 DIAGNOSIS — Z4800 Encounter for change or removal of nonsurgical wound dressing: Secondary | ICD-10-CM | POA: Diagnosis not present

## 2018-09-03 DIAGNOSIS — L89623 Pressure ulcer of left heel, stage 3: Secondary | ICD-10-CM | POA: Diagnosis not present

## 2018-09-03 DIAGNOSIS — Z89421 Acquired absence of other right toe(s): Secondary | ICD-10-CM | POA: Diagnosis not present

## 2018-09-05 ENCOUNTER — Emergency Department: Payer: Medicare Other

## 2018-09-05 ENCOUNTER — Inpatient Hospital Stay
Admission: EM | Admit: 2018-09-05 | Discharge: 2018-09-12 | DRG: 253 | Disposition: A | Discharge status: Home Health | Payer: Medicare Other | Attending: Internal Medicine | Admitting: Internal Medicine

## 2018-09-05 ENCOUNTER — Other Ambulatory Visit: Payer: Self-pay

## 2018-09-05 ENCOUNTER — Encounter: Payer: Self-pay | Admitting: Emergency Medicine

## 2018-09-05 DIAGNOSIS — Z794 Long term (current) use of insulin: Secondary | ICD-10-CM

## 2018-09-05 DIAGNOSIS — I5042 Chronic combined systolic (congestive) and diastolic (congestive) heart failure: Secondary | ICD-10-CM | POA: Diagnosis not present

## 2018-09-05 DIAGNOSIS — B964 Proteus (mirabilis) (morganii) as the cause of diseases classified elsewhere: Secondary | ICD-10-CM | POA: Diagnosis not present

## 2018-09-05 DIAGNOSIS — Z7901 Long term (current) use of anticoagulants: Secondary | ICD-10-CM

## 2018-09-05 DIAGNOSIS — N184 Chronic kidney disease, stage 4 (severe): Secondary | ICD-10-CM | POA: Diagnosis present

## 2018-09-05 DIAGNOSIS — L089 Local infection of the skin and subcutaneous tissue, unspecified: Secondary | ICD-10-CM

## 2018-09-05 DIAGNOSIS — L97429 Non-pressure chronic ulcer of left heel and midfoot with unspecified severity: Secondary | ICD-10-CM | POA: Diagnosis present

## 2018-09-05 DIAGNOSIS — D631 Anemia in chronic kidney disease: Secondary | ICD-10-CM | POA: Diagnosis present

## 2018-09-05 DIAGNOSIS — E1122 Type 2 diabetes mellitus with diabetic chronic kidney disease: Secondary | ICD-10-CM | POA: Diagnosis present

## 2018-09-05 DIAGNOSIS — E11621 Type 2 diabetes mellitus with foot ulcer: Secondary | ICD-10-CM | POA: Diagnosis present

## 2018-09-05 DIAGNOSIS — Z8349 Family history of other endocrine, nutritional and metabolic diseases: Secondary | ICD-10-CM

## 2018-09-05 DIAGNOSIS — I2510 Atherosclerotic heart disease of native coronary artery without angina pectoris: Secondary | ICD-10-CM | POA: Diagnosis present

## 2018-09-05 DIAGNOSIS — I10 Essential (primary) hypertension: Secondary | ICD-10-CM | POA: Diagnosis present

## 2018-09-05 DIAGNOSIS — Z8249 Family history of ischemic heart disease and other diseases of the circulatory system: Secondary | ICD-10-CM

## 2018-09-05 DIAGNOSIS — L89610 Pressure ulcer of right heel, unstageable: Secondary | ICD-10-CM

## 2018-09-05 DIAGNOSIS — E1151 Type 2 diabetes mellitus with diabetic peripheral angiopathy without gangrene: Secondary | ICD-10-CM | POA: Diagnosis not present

## 2018-09-05 DIAGNOSIS — I480 Paroxysmal atrial fibrillation: Secondary | ICD-10-CM | POA: Diagnosis present

## 2018-09-05 DIAGNOSIS — M869 Osteomyelitis, unspecified: Secondary | ICD-10-CM

## 2018-09-05 DIAGNOSIS — Z1159 Encounter for screening for other viral diseases: Secondary | ICD-10-CM | POA: Diagnosis not present

## 2018-09-05 DIAGNOSIS — Z95810 Presence of automatic (implantable) cardiac defibrillator: Secondary | ICD-10-CM

## 2018-09-05 DIAGNOSIS — T8140XA Infection following a procedure, unspecified, initial encounter: Secondary | ICD-10-CM | POA: Diagnosis not present

## 2018-09-05 DIAGNOSIS — Z8673 Personal history of transient ischemic attack (TIA), and cerebral infarction without residual deficits: Secondary | ICD-10-CM

## 2018-09-05 DIAGNOSIS — Z7952 Long term (current) use of systemic steroids: Secondary | ICD-10-CM

## 2018-09-05 DIAGNOSIS — L97416 Non-pressure chronic ulcer of right heel and midfoot with bone involvement without evidence of necrosis: Secondary | ICD-10-CM | POA: Diagnosis not present

## 2018-09-05 DIAGNOSIS — E1152 Type 2 diabetes mellitus with diabetic peripheral angiopathy with gangrene: Principal | ICD-10-CM | POA: Diagnosis present

## 2018-09-05 DIAGNOSIS — E11628 Type 2 diabetes mellitus with other skin complications: Secondary | ICD-10-CM | POA: Diagnosis present

## 2018-09-05 DIAGNOSIS — Z03818 Encounter for observation for suspected exposure to other biological agents ruled out: Secondary | ICD-10-CM | POA: Diagnosis not present

## 2018-09-05 DIAGNOSIS — M109 Gout, unspecified: Secondary | ICD-10-CM | POA: Diagnosis present

## 2018-09-05 DIAGNOSIS — T148XXA Other injury of unspecified body region, initial encounter: Principal | ICD-10-CM

## 2018-09-05 DIAGNOSIS — E1142 Type 2 diabetes mellitus with diabetic polyneuropathy: Secondary | ICD-10-CM | POA: Diagnosis present

## 2018-09-05 DIAGNOSIS — M86171 Other acute osteomyelitis, right ankle and foot: Secondary | ICD-10-CM | POA: Diagnosis present

## 2018-09-05 DIAGNOSIS — L89623 Pressure ulcer of left heel, stage 3: Secondary | ICD-10-CM | POA: Diagnosis not present

## 2018-09-05 DIAGNOSIS — Z833 Family history of diabetes mellitus: Secondary | ICD-10-CM

## 2018-09-05 DIAGNOSIS — I130 Hypertensive heart and chronic kidney disease with heart failure and stage 1 through stage 4 chronic kidney disease, or unspecified chronic kidney disease: Secondary | ICD-10-CM | POA: Diagnosis present

## 2018-09-05 DIAGNOSIS — E1169 Type 2 diabetes mellitus with other specified complication: Secondary | ICD-10-CM

## 2018-09-05 DIAGNOSIS — I779 Disorder of arteries and arterioles, unspecified: Secondary | ICD-10-CM

## 2018-09-05 DIAGNOSIS — E118 Type 2 diabetes mellitus with unspecified complications: Secondary | ICD-10-CM | POA: Diagnosis not present

## 2018-09-05 DIAGNOSIS — Z951 Presence of aortocoronary bypass graft: Secondary | ICD-10-CM

## 2018-09-05 DIAGNOSIS — L97519 Non-pressure chronic ulcer of other part of right foot with unspecified severity: Secondary | ICD-10-CM | POA: Diagnosis not present

## 2018-09-05 DIAGNOSIS — I70202 Unspecified atherosclerosis of native arteries of extremities, left leg: Secondary | ICD-10-CM | POA: Diagnosis present

## 2018-09-05 DIAGNOSIS — I252 Old myocardial infarction: Secondary | ICD-10-CM

## 2018-09-05 DIAGNOSIS — D62 Acute posthemorrhagic anemia: Secondary | ICD-10-CM | POA: Diagnosis present

## 2018-09-05 DIAGNOSIS — Z89421 Acquired absence of other right toe(s): Secondary | ICD-10-CM | POA: Diagnosis not present

## 2018-09-05 DIAGNOSIS — I70234 Atherosclerosis of native arteries of right leg with ulceration of heel and midfoot: Secondary | ICD-10-CM | POA: Diagnosis present

## 2018-09-05 DIAGNOSIS — Z4800 Encounter for change or removal of nonsurgical wound dressing: Secondary | ICD-10-CM | POA: Diagnosis not present

## 2018-09-05 DIAGNOSIS — G4733 Obstructive sleep apnea (adult) (pediatric): Secondary | ICD-10-CM | POA: Diagnosis present

## 2018-09-05 DIAGNOSIS — Z7982 Long term (current) use of aspirin: Secondary | ICD-10-CM

## 2018-09-05 DIAGNOSIS — Z89431 Acquired absence of right foot: Secondary | ICD-10-CM

## 2018-09-05 DIAGNOSIS — L89613 Pressure ulcer of right heel, stage 3: Secondary | ICD-10-CM | POA: Diagnosis not present

## 2018-09-05 DIAGNOSIS — E119 Type 2 diabetes mellitus without complications: Secondary | ICD-10-CM

## 2018-09-05 DIAGNOSIS — I5022 Chronic systolic (congestive) heart failure: Secondary | ICD-10-CM | POA: Diagnosis present

## 2018-09-05 DIAGNOSIS — I739 Peripheral vascular disease, unspecified: Secondary | ICD-10-CM

## 2018-09-05 LAB — CBC WITH DIFFERENTIAL/PLATELET
Abs Immature Granulocytes: 0.05 10*3/uL (ref 0.00–0.07)
Basophils Absolute: 0 10*3/uL (ref 0.0–0.1)
Basophils Relative: 0 %
Eosinophils Absolute: 0 10*3/uL (ref 0.0–0.5)
Eosinophils Relative: 0 %
HCT: 26 % — ABNORMAL LOW (ref 39.0–52.0)
Hemoglobin: 7.7 g/dL — ABNORMAL LOW (ref 13.0–17.0)
Immature Granulocytes: 0 %
Lymphocytes Relative: 7 %
Lymphs Abs: 0.9 10*3/uL (ref 0.7–4.0)
MCH: 25.6 pg — ABNORMAL LOW (ref 26.0–34.0)
MCHC: 29.6 g/dL — ABNORMAL LOW (ref 30.0–36.0)
MCV: 86.4 fL (ref 80.0–100.0)
Monocytes Absolute: 0.7 10*3/uL (ref 0.1–1.0)
Monocytes Relative: 6 %
Neutro Abs: 11.1 10*3/uL — ABNORMAL HIGH (ref 1.7–7.7)
Neutrophils Relative %: 87 %
Platelets: 327 10*3/uL (ref 150–400)
RBC: 3.01 MIL/uL — ABNORMAL LOW (ref 4.22–5.81)
RDW: 17.5 % — ABNORMAL HIGH (ref 11.5–15.5)
WBC: 12.8 10*3/uL — ABNORMAL HIGH (ref 4.0–10.5)
nRBC: 0 % (ref 0.0–0.2)

## 2018-09-05 LAB — COMPREHENSIVE METABOLIC PANEL
ALT: 9 U/L (ref 0–44)
AST: 15 U/L (ref 15–41)
Albumin: 3.5 g/dL (ref 3.5–5.0)
Alkaline Phosphatase: 99 U/L (ref 38–126)
Anion gap: 13 (ref 5–15)
BUN: 60 mg/dL — ABNORMAL HIGH (ref 6–20)
CO2: 20 mmol/L — ABNORMAL LOW (ref 22–32)
Calcium: 8.9 mg/dL (ref 8.9–10.3)
Chloride: 103 mmol/L (ref 98–111)
Creatinine, Ser: 2.85 mg/dL — ABNORMAL HIGH (ref 0.61–1.24)
GFR calc Af Amer: 28 mL/min — ABNORMAL LOW (ref >60)
GFR calc non Af Amer: 24 mL/min — ABNORMAL LOW (ref >60)
Glucose, Bld: 280 mg/dL — ABNORMAL HIGH (ref 70–99)
Potassium: 3.7 mmol/L (ref 3.5–5.1)
Sodium: 136 mmol/L (ref 135–145)
Total Bilirubin: 0.5 mg/dL (ref 0.3–1.2)
Total Protein: 7.3 g/dL (ref 6.5–8.1)

## 2018-09-05 MED ORDER — VANCOMYCIN HCL IN DEXTROSE 1-5 GM/200ML-% IV SOLN
1000.0000 mg | Freq: Once | INTRAVENOUS | Status: DC
  Filled 2018-09-05: qty 200

## 2018-09-05 MED ORDER — SODIUM CHLORIDE 0.9 % IV SOLN
2.0000 g | Freq: Once | INTRAVENOUS | Status: AC
  Administered 2018-09-06: 2 g via INTRAVENOUS
  Filled 2018-09-05: qty 20

## 2018-09-05 MED ORDER — VANCOMYCIN HCL 10 G IV SOLR
2000.0000 mg | Freq: Once | INTRAVENOUS | Status: AC
  Administered 2018-09-06: 2000 mg via INTRAVENOUS
  Filled 2018-09-05: qty 2000

## 2018-09-05 NOTE — ED Notes (Signed)
Pt to the ER at the behest of his wound care nurse. Pt has bilateral pressure wounds to his heels that he currently has wound vacs applied. Pt has a hx of osteomyelitis. Wound on the right foot is healing nicely. Wound on the left heel has purulent drainage and is larger than the left. A foul odor is present.

## 2018-09-05 NOTE — ED Provider Notes (Signed)
[facility] Emergency Department Provider Note  ____________________________________________  Time seen: Approximately 10:34 PM  I have reviewed the triage vital signs and the nursing notes.   HISTORY  Chief Complaint Wound Infection    HPI Paul [name]. is a 53 y.o. male with a history of CHF CKD diabetes and systolic heart failure and hypertension who comes the ED for evaluation of possible right heel chronic wound infection.  The patient had debridement of bilateral heel wounds about a month ago by podiatry Dr. Elvina Mattes.  He has had wound vacs and been getting wound care at home.  He completed a course of antibiotics.  Last week, he had a different wound care nurse who had trouble getting his wound VAC applied properly.  When his wound care nurse came today, they noticed increased exudate and a foul odor.   Patient denies feeling fevers chills malaise fatigue chest pain shortness of breath abdominal pain or other acute complaints.  He has chronic peripheral neuropathy.     Past Medical History:  Diagnosis Date  . Anemia   . Cardiac defibrillator in place    a. Biotronik LUmax 540 DRT, (ser # [id number]).  . Carotid arterial disease ([place])    a. s/p prior LICA stenting;  b. 11/9022 Carotid U/S: 40-59% bilat ICA stenosis. Patent LICA stent.  . CHF (congestive heart failure) ([place])   . CKD (chronic kidney disease), stage III ([place])   . Coronary artery disease    a. 2010 s/p CABG x 3.  . DDD (degenerative disc disease), lumbosacral    L5-S1  . Diabetes ([place])    Lantus at bedtime  . Gangrene of toe of right foot ([place])   . Gout of left hand 10/06/2016  . HFrEF (heart failure with reduced ejection fraction) ([place])    a. 07/2016 Echo: EF 25-30%, diff HK, mild MR, mildly dil LA, mod reduced RV fxn, PASP 63mmHg.  Marland Kitchen Hypertension    takes Coreg daily  . Ischemic cardiomyopathy    a. 07/2016 Echo: EF 25-30%, diff HK.  . Myocardial infarction ([place]) 2009  .  Peripheral vascular disease ([place])   . Sleep apnea    sleep study yr ago-unable to afford cpap  . Stroke [facility]) 09   no weakness     Patient Active Problem List   Diagnosis Date Noted  . Acute osteomyelitis of right foot ([place]) 07/25/2018  . Osteomyelitis ([place]) 07/25/2018  . Anasarca 05/25/2018  . Pressure injury of skin 05/25/2018  . CKD (chronic kidney disease), stage III ([place]) 12/15/2016  . Ulcerated, foot ([place]) 12/15/2016  . Gangrene of foot ([place]) 08/21/2016  . HTN (hypertension) 08/21/2016  . Diabetes ([place]) 08/21/2016  . CAD (coronary artery disease) 08/21/2016  . Chronic systolic CHF (congestive heart failure) ([place]) 08/21/2016  . Diabetic foot infection ([place]) 08/21/2016  . Heterotopic ossification of bone 07/12/2014     Past Surgical History:  Procedure Laterality Date  . AMPUTATION TOE Right 08/22/2016   Procedure: AMPUTATION TOE;  Surgeon: Samara Deist, DPM;  Location: [facility] ORS;  Service: Podiatry;  Laterality: Right;  . AMPUTATION TOE Right 10/09/2016   Procedure: AMPUTATION TOE-RIGHT 2ND MPJ;  Surgeon: Samara Deist, DPM;  Location: [facility] ORS;  Service: Podiatry;  Laterality: Right;  . APLIGRAFT PLACEMENT Right 10/09/2016   Procedure: APLIGRAFT PLACEMENT;  Surgeon: Samara Deist, DPM;  Location: [facility] ORS;  Service: Podiatry;  Laterality: Right;  . CALCANEAL OSTEOTOMY Bilateral 07/28/2018   Procedure: RIGHT CALCANECTOMY BILATERAL DEBRIDEMENT OF ULCERS ON  HEELS;  Surgeon: Albertine Patricia, DPM;  Location: [facility] ORS;  Service: Podiatry;  Laterality: Bilateral;  . CARDIAC DEFIBRILLATOR PLACEMENT  2011  . CAROTID ENDARTERECTOMY Left   . CHOLECYSTECTOMY  2010  . CORONARY ARTERY BYPASS GRAFT  2010   CABG x 3   . GRAFT APPLICATION Right 5/57/3220   Procedure: FULL THICKNESS SKIN GRAFT-RIGHT FOOT;  Surgeon: Algernon Huxley, MD;  Location: [facility] ORS;  Service: Vascular;  Laterality: Right;  . HIP SURGERY Right 1994  . INCISION AND DRAINAGE OF WOUND Right 08/22/2016   Procedure:  IRRIGATION AND DEBRIDEMENT WOUND and wound vac placement;  Surgeon: Samara Deist, DPM;  Location: [facility] ORS;  Service: Podiatry;  Laterality: Right;  . LOWER EXTREMITY ANGIOGRAPHY Right 08/24/2016   Procedure: Lower Extremity Angiography;  Surgeon: Algernon Huxley, MD;  Location: [place] CV LAB;  Service: Cardiovascular;  Laterality: Right;  . LOWER EXTREMITY ANGIOGRAPHY Right 07/27/2018   Procedure: RIGHT Lower Extremity Angiography;  Surgeon: Algernon Huxley, MD;  Location: [place] CV LAB;  Service: Cardiovascular;  Laterality: Right;  . MASS EXCISION Right 07/18/2014   Procedure: EXCISION HETEROTOPIC BONE RIGHT HIP;  Surgeon: Frederik Pear, MD;  Location: [place];  Service: Orthopedics;  Laterality: Right;  . Open Heart Surgery  2010   x 3  . WOUND DEBRIDEMENT Right 10/09/2016   Procedure: DEBRIDEMENT WOUND;  Surgeon: Samara Deist, DPM;  Location: [facility] ORS;  Service: Podiatry;  Laterality: Right;     Prior to Admission medications   Medication Sig Start Date End Date Taking? Authorizing Provider  acetaminophen (TYLENOL) 325 MG tablet Take 2 tablets (650 mg total) by mouth every 6 (six) hours as needed for mild pain (or Fever >/= 101). 05/29/18   Loletha Grayer, MD  allopurinol (ZYLOPRIM) 100 MG tablet Take 1 tablet (100 mg total) by mouth daily. 08/02/18   Fritzi Mandes, MD  apixaban (ELIQUIS) 5 MG TABS tablet Take 1 tablet (5 mg total) by mouth 2 (two) times daily. Patient not taking: Reported on 07/25/2018 05/29/18   Loletha Grayer, MD  aspirin EC 81 MG tablet Take 1 tablet (81 mg total) by mouth daily. 10/21/16   Theora Gianotti, NP  carvedilol (COREG) 3.125 MG tablet Take 1 tablet (3.125 mg total) by mouth 2 (two) times daily. Patient not taking: Reported on 07/27/2018 05/29/18   Loletha Grayer, MD  Cholecalciferol (VITAMIN D3) 1.25 MG (50000 UT) CAPS Take 1 capsule by mouth once a week. 07/08/18   [provider]  gabapentin (NEURONTIN) 100 MG capsule Take 200 mg by mouth  3 (three) times daily.  03/03/18   [provider]  insulin glargine (LANTUS) 100 unit/mL SOPN Inject 0.2 mLs (20 Units total) into the skin at bedtime. Patient not taking: Reported on 07/27/2018 05/29/18   Loletha Grayer, MD  predniSONE (DELTASONE) 5 MG tablet Take 1 tablet (5 mg total) by mouth daily with breakfast. 08/02/18   Fritzi Mandes, MD     Allergies Ibuprofen; Baclofen; Metformin; and Nsaids   Family History  Problem Relation Age of Onset  . Hypertension Other   . Diabetes Other   . Diabetes Father   . Hyperlipidemia Father   . Hypertension Father   . Hyperlipidemia Sister   . Hypertension Sister   . Diabetes Sister   . Diabetes Brother   . Hypertension Brother   . Hyperlipidemia Brother     Social History Social History   Tobacco Use  . Smoking status: Never Smoker  . Smokeless tobacco:  Never Used  Substance Use Topics  . Alcohol use: No  . Drug use: No    Review of Systems  Constitutional:   No fever or chills.  ENT:   No sore throat. No rhinorrhea. Cardiovascular:   No chest pain or syncope. Respiratory:   No dyspnea or cough. Gastrointestinal:   Negative for abdominal pain, vomiting and diarrhea.  Musculoskeletal:   Positive chronic wounds on bilateral heels. All other systems reviewed and are negative except as documented above in ROS and HPI.  ____________________________________________   PHYSICAL EXAM:  VITAL SIGNS: ED Triage Vitals  Enc Vitals Group     BP 09/05/18 1751 96/63     Pulse Rate 09/05/18 1751 81     Resp 09/05/18 1751 18     Temp 09/05/18 1751 98.4 F (36.9 C)     Temp Source 09/05/18 1751 Oral     SpO2 09/05/18 1751 100 %     Weight 09/05/18 1752 217 lb (98.4 kg)     Height 09/05/18 1752 5\' 9"  (1.753 m)     Head Circumference --      Peak Flow --      Pain Score 09/05/18 1751 5     Pain Loc --      Pain Edu? --      Excl. in [place]? --     Vital signs reviewed, nursing assessments reviewed.   Constitutional:    Alert and oriented. Non-toxic appearance. Eyes:   Conjunctivae are normal. EOMI. PERRL. ENT      Head:   Normocephalic and atraumatic.       Neck:   No meningismus. Full ROM.  Cardiovascular:   RRR. Symmetric bilateral radial and DP pulses.  No murmurs. Cap refill less than 2 seconds. Respiratory:   Normal respiratory effort without tachypnea/retractions. Breath sounds are clear and equal bilaterally. No wheezes/rales/rhonchi. Gastrointestinal:   Soft and nontender. Non distended. There is no CVA tenderness.  No rebound, rigidity, or guarding. Musculoskeletal:   Normal range of motion in all extremities. No joint effusions.  Lymphedema bilateral lower extremities.  Left heel with a 3 to 4 cm soft tissue wound over the posterior heel, healing well without inflammatory changes.  Right heel with a 5 cm soft tissue wound over the posterior heel.  There is fibrinous exudate and some necrotic tissue.  I am able to express a small amount of purulent fluid.  There is no tunneling or undermining.  Tissue is well perfused, with some oozing bleeding on gentle debridement.        Neurologic:   Normal speech and language.  Motor grossly intact. No acute focal neurologic deficits are appreciated.  Skin:    Skin is warm, dry and intact. No rash noted.  No petechiae, purpura, or bullae.  Soft tissue findings as above  ____________________________________________    LABS (pertinent positives/negatives) (all labs ordered are listed, but only abnormal results are displayed) Labs Reviewed  COMPREHENSIVE METABOLIC PANEL - Abnormal; Notable for the following components:      Result Value   CO2 20 (*)    Glucose, Bld 280 (*)    BUN 60 (*)    Creatinine, Ser 2.85 (*)    GFR calc non Af Amer 24 (*)    GFR calc Af Amer 28 (*)    All other components within normal limits  CBC WITH DIFFERENTIAL/PLATELET - Abnormal; Notable for the following components:   WBC 12.8 (*)    RBC 3.01 (*)  Hemoglobin 7.7  (*)    HCT 26.0 (*)    MCH 25.6 (*)    MCHC 29.6 (*)    RDW 17.5 (*)    Neutro Abs 11.1 (*)    All other components within normal limits  SARS CORONAVIRUS 2 (HOSPITAL ORDER, [place] [facility])   ____________________________________________   EKG    ____________________________________________    RADIOLOGY  Dg Foot 2 Views Right  Result Date: 09/05/2018 CLINICAL DATA:  53 y/o M; wound evaluation. 07/28/2018 calcaneal osteotomy and debridement. EXAM: RIGHT FOOT - 2 VIEW COMPARISON:  07/25/2018 right ankle radiographs. 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 Large dorsal calcaneal soft tissue post debridement, increased in size of soft tissue defect from prior CT of the ankle. Additionally, there is increased cortical lucency along the subjacent calcaneus. Vascular calcifications noted. IMPRESSION: Dorsal calcaneus soft tissue ulceration and subjacent bony cortical lucencies are increased in comparison with the prior CT of ankle which may represent progressive infection and/or interval postsurgical changes. Electronically Signed   By: Kristine Garbe M.D.   On: 09/05/2018 22:27    ____________________________________________   PROCEDURES Procedures  ____________________________________________  DIFFERENTIAL DIAGNOSIS   Cellulitis, osteomyelitis, wound complication from wound VAC dysfunction  CLINICAL IMPRESSION / ASSESSMENT AND PLAN / ED COURSE  Medications ordered in the ED: Medications  vancomycin (VANCOCIN) IVPB 1000 mg/200 mL premix (has no administration in time range)  cefTRIAXone (ROCEPHIN) 2 g in sodium chloride 0.9 % 100 mL IVPB (has no administration in time range)    Pertinent labs & imaging results that were available during my care of the patient were reviewed by me and considered in my medical decision making (see chart for details).  Paul Ala. was evaluated  in Emergency Department on 09/05/2018 for the symptoms described in the history of present illness. He was evaluated in the context of the global COVID-19 pandemic, which necessitated consideration that the patient might be at risk for infection with the SARS-CoV-2 virus that causes COVID-19. Institutional protocols and algorithms that pertain to the evaluation of patients at risk for COVID-19 are in a state of rapid change based on information released by regulatory bodies including the CDC and federal and state organizations. These policies and algorithms were followed during the patient's care in the ED.   Patient presents with some increased purulence of his right foot wound in the setting of recent wound VAC dysfunction.  Will obtain labs and x-ray for evaluation in the ED.  He is nontoxic, well-appearing, vital signs unremarkable.  Not septic.  Clinical Course as of Sep 04 2316  Mon Sep 05, 2018  2207 Labs show baseline chronic anemia and CKD.  White blood cell count not significantly elevated, improved from previous.   [PS]  2256 X-ray shows some increased cortical lucency of the calcaneus concerning for osteomyelitis.  Podiatry paged given their intimate knowledge of the patient's wound and recent debridement of this wound to help guide management.   [PS]    Clinical Course User Index [PS] Carrie Mew, MD     ----------------------------------------- 11:18 PM on 09/05/2018 -----------------------------------------  We will plan to admit, start IV antibiotics of vancomycin and ceftriaxone  ____________________________________________   FINAL CLINICAL IMPRESSION(S) / ED DIAGNOSES    Final diagnoses:  Wound infection  Type 2 diabetes mellitus with other specified complication, unspecified whether long term insulin use ([employer])  Other acute osteomyelitis of right foot [facility])     ED Discharge  Orders    None      Portions of this note were generated with dragon dictation  software. Dictation errors may occur despite best attempts at proofreading.   Carrie Mew, MD 09/05/18 2318

## 2018-09-05 NOTE — ED Notes (Signed)
Pt pushed to the commode to allow pt to use the restroom.

## 2018-09-05 NOTE — ED Notes (Signed)
X-ray in with pt 

## 2018-09-05 NOTE — ED Triage Notes (Signed)
Patient reports having surgery on bilateral feet approximately 1 month ago. Patient reports when he had dressing changed today, his nurse noticed an odor to right foot and he was instructed by PCP to come have wound evaluated. Patient states he recently finished oral antibiotics. Denies fever or chills at home.

## 2018-09-06 DIAGNOSIS — E1152 Type 2 diabetes mellitus with diabetic peripheral angiopathy with gangrene: Secondary | ICD-10-CM | POA: Diagnosis present

## 2018-09-06 DIAGNOSIS — L97429 Non-pressure chronic ulcer of left heel and midfoot with unspecified severity: Secondary | ICD-10-CM | POA: Diagnosis present

## 2018-09-06 DIAGNOSIS — Z89411 Acquired absence of right great toe: Secondary | ICD-10-CM | POA: Diagnosis not present

## 2018-09-06 DIAGNOSIS — I5040 Unspecified combined systolic (congestive) and diastolic (congestive) heart failure: Secondary | ICD-10-CM

## 2018-09-06 DIAGNOSIS — Z1159 Encounter for screening for other viral diseases: Secondary | ICD-10-CM | POA: Diagnosis not present

## 2018-09-06 DIAGNOSIS — B9689 Other specified bacterial agents as the cause of diseases classified elsewhere: Secondary | ICD-10-CM | POA: Diagnosis not present

## 2018-09-06 DIAGNOSIS — I6523 Occlusion and stenosis of bilateral carotid arteries: Secondary | ICD-10-CM | POA: Diagnosis not present

## 2018-09-06 DIAGNOSIS — L89610 Pressure ulcer of right heel, unstageable: Secondary | ICD-10-CM | POA: Diagnosis present

## 2018-09-06 DIAGNOSIS — Z951 Presence of aortocoronary bypass graft: Secondary | ICD-10-CM

## 2018-09-06 DIAGNOSIS — Z833 Family history of diabetes mellitus: Secondary | ICD-10-CM | POA: Diagnosis not present

## 2018-09-06 DIAGNOSIS — N189 Chronic kidney disease, unspecified: Secondary | ICD-10-CM | POA: Diagnosis not present

## 2018-09-06 DIAGNOSIS — Z89431 Acquired absence of right foot: Secondary | ICD-10-CM | POA: Diagnosis not present

## 2018-09-06 DIAGNOSIS — I10 Essential (primary) hypertension: Secondary | ICD-10-CM

## 2018-09-06 DIAGNOSIS — D631 Anemia in chronic kidney disease: Secondary | ICD-10-CM | POA: Diagnosis not present

## 2018-09-06 DIAGNOSIS — I5042 Chronic combined systolic (congestive) and diastolic (congestive) heart failure: Secondary | ICD-10-CM | POA: Diagnosis present

## 2018-09-06 DIAGNOSIS — E1151 Type 2 diabetes mellitus with diabetic peripheral angiopathy without gangrene: Secondary | ICD-10-CM

## 2018-09-06 DIAGNOSIS — I130 Hypertensive heart and chronic kidney disease with heart failure and stage 1 through stage 4 chronic kidney disease, or unspecified chronic kidney disease: Secondary | ICD-10-CM | POA: Diagnosis present

## 2018-09-06 DIAGNOSIS — E11628 Type 2 diabetes mellitus with other skin complications: Secondary | ICD-10-CM | POA: Diagnosis not present

## 2018-09-06 DIAGNOSIS — Z8349 Family history of other endocrine, nutritional and metabolic diseases: Secondary | ICD-10-CM | POA: Diagnosis not present

## 2018-09-06 DIAGNOSIS — B9562 Methicillin resistant Staphylococcus aureus infection as the cause of diseases classified elsewhere: Secondary | ICD-10-CM | POA: Diagnosis not present

## 2018-09-06 DIAGNOSIS — I129 Hypertensive chronic kidney disease with stage 1 through stage 4 chronic kidney disease, or unspecified chronic kidney disease: Secondary | ICD-10-CM | POA: Diagnosis not present

## 2018-09-06 DIAGNOSIS — I739 Peripheral vascular disease, unspecified: Secondary | ICD-10-CM | POA: Diagnosis not present

## 2018-09-06 DIAGNOSIS — Z8249 Family history of ischemic heart disease and other diseases of the circulatory system: Secondary | ICD-10-CM | POA: Diagnosis not present

## 2018-09-06 DIAGNOSIS — I2510 Atherosclerotic heart disease of native coronary artery without angina pectoris: Secondary | ICD-10-CM | POA: Diagnosis not present

## 2018-09-06 DIAGNOSIS — M86171 Other acute osteomyelitis, right ankle and foot: Secondary | ICD-10-CM | POA: Diagnosis present

## 2018-09-06 DIAGNOSIS — Z89421 Acquired absence of other right toe(s): Secondary | ICD-10-CM | POA: Diagnosis not present

## 2018-09-06 DIAGNOSIS — Z95810 Presence of automatic (implantable) cardiac defibrillator: Secondary | ICD-10-CM

## 2018-09-06 DIAGNOSIS — M109 Gout, unspecified: Secondary | ICD-10-CM | POA: Diagnosis present

## 2018-09-06 DIAGNOSIS — I70202 Unspecified atherosclerosis of native arteries of extremities, left leg: Secondary | ICD-10-CM | POA: Diagnosis present

## 2018-09-06 DIAGNOSIS — R809 Proteinuria, unspecified: Secondary | ICD-10-CM | POA: Diagnosis not present

## 2018-09-06 DIAGNOSIS — Z886 Allergy status to analgesic agent status: Secondary | ICD-10-CM

## 2018-09-06 DIAGNOSIS — Z8673 Personal history of transient ischemic attack (TIA), and cerebral infarction without residual deficits: Secondary | ICD-10-CM | POA: Diagnosis not present

## 2018-09-06 DIAGNOSIS — L97416 Non-pressure chronic ulcer of right heel and midfoot with bone involvement without evidence of necrosis: Secondary | ICD-10-CM | POA: Diagnosis present

## 2018-09-06 DIAGNOSIS — D649 Anemia, unspecified: Secondary | ICD-10-CM | POA: Diagnosis not present

## 2018-09-06 DIAGNOSIS — E1122 Type 2 diabetes mellitus with diabetic chronic kidney disease: Secondary | ICD-10-CM

## 2018-09-06 DIAGNOSIS — Z8631 Personal history of diabetic foot ulcer: Secondary | ICD-10-CM

## 2018-09-06 DIAGNOSIS — I5022 Chronic systolic (congestive) heart failure: Secondary | ICD-10-CM | POA: Diagnosis not present

## 2018-09-06 DIAGNOSIS — L97419 Non-pressure chronic ulcer of right heel and midfoot with unspecified severity: Secondary | ICD-10-CM

## 2018-09-06 DIAGNOSIS — E1169 Type 2 diabetes mellitus with other specified complication: Secondary | ICD-10-CM | POA: Diagnosis present

## 2018-09-06 DIAGNOSIS — Z8619 Personal history of other infectious and parasitic diseases: Secondary | ICD-10-CM

## 2018-09-06 DIAGNOSIS — M869 Osteomyelitis, unspecified: Secondary | ICD-10-CM | POA: Diagnosis not present

## 2018-09-06 DIAGNOSIS — I70234 Atherosclerosis of native arteries of right leg with ulceration of heel and midfoot: Secondary | ICD-10-CM | POA: Diagnosis present

## 2018-09-06 DIAGNOSIS — N184 Chronic kidney disease, stage 4 (severe): Secondary | ICD-10-CM | POA: Diagnosis present

## 2018-09-06 DIAGNOSIS — L89613 Pressure ulcer of right heel, stage 3: Secondary | ICD-10-CM | POA: Diagnosis not present

## 2018-09-06 DIAGNOSIS — Z8614 Personal history of Methicillin resistant Staphylococcus aureus infection: Secondary | ICD-10-CM

## 2018-09-06 DIAGNOSIS — M65171 Other infective (teno)synovitis, right ankle and foot: Secondary | ICD-10-CM | POA: Diagnosis not present

## 2018-09-06 DIAGNOSIS — Z7982 Long term (current) use of aspirin: Secondary | ICD-10-CM | POA: Diagnosis not present

## 2018-09-06 DIAGNOSIS — L89623 Pressure ulcer of left heel, stage 3: Secondary | ICD-10-CM | POA: Diagnosis not present

## 2018-09-06 DIAGNOSIS — E1142 Type 2 diabetes mellitus with diabetic polyneuropathy: Secondary | ICD-10-CM | POA: Diagnosis not present

## 2018-09-06 DIAGNOSIS — M7661 Achilles tendinitis, right leg: Secondary | ICD-10-CM | POA: Diagnosis not present

## 2018-09-06 DIAGNOSIS — D62 Acute posthemorrhagic anemia: Secondary | ICD-10-CM | POA: Diagnosis present

## 2018-09-06 DIAGNOSIS — E11621 Type 2 diabetes mellitus with foot ulcer: Secondary | ICD-10-CM

## 2018-09-06 DIAGNOSIS — E119 Type 2 diabetes mellitus without complications: Secondary | ICD-10-CM | POA: Diagnosis not present

## 2018-09-06 DIAGNOSIS — S91301A Unspecified open wound, right foot, initial encounter: Secondary | ICD-10-CM | POA: Diagnosis not present

## 2018-09-06 DIAGNOSIS — N179 Acute kidney failure, unspecified: Secondary | ICD-10-CM | POA: Diagnosis not present

## 2018-09-06 DIAGNOSIS — Z794 Long term (current) use of insulin: Secondary | ICD-10-CM | POA: Diagnosis not present

## 2018-09-06 DIAGNOSIS — L089 Local infection of the skin and subcutaneous tissue, unspecified: Secondary | ICD-10-CM | POA: Diagnosis not present

## 2018-09-06 DIAGNOSIS — Z888 Allergy status to other drugs, medicaments and biological substances status: Secondary | ICD-10-CM

## 2018-09-06 LAB — GLUCOSE, CAPILLARY
Glucose-Capillary: 133 mg/dL — ABNORMAL HIGH (ref 70–99)
Glucose-Capillary: 107 mg/dL — ABNORMAL HIGH (ref 70–99)
Glucose-Capillary: 155 mg/dL — ABNORMAL HIGH (ref 70–99)
Glucose-Capillary: 106 mg/dL — ABNORMAL HIGH (ref 70–99)

## 2018-09-06 LAB — CBC
HCT: 29.2 % — ABNORMAL LOW (ref 39.0–52.0)
Hemoglobin: 8.7 g/dL — ABNORMAL LOW (ref 13.0–17.0)
MCH: 25.7 pg — ABNORMAL LOW (ref 26.0–34.0)
MCHC: 29.8 g/dL — ABNORMAL LOW (ref 30.0–36.0)
MCV: 86.1 fL (ref 80.0–100.0)
Platelets: 390 10*3/uL (ref 150–400)
RBC: 3.39 MIL/uL — ABNORMAL LOW (ref 4.22–5.81)
RDW: 17.6 % — ABNORMAL HIGH (ref 11.5–15.5)
WBC: 15.7 10*3/uL — ABNORMAL HIGH (ref 4.0–10.5)
nRBC: 0 % (ref 0.0–0.2)

## 2018-09-06 LAB — BASIC METABOLIC PANEL
Anion gap: 8 (ref 5–15)
BUN: 56 mg/dL — ABNORMAL HIGH (ref 6–20)
CO2: 25 mmol/L (ref 22–32)
Calcium: 8.9 mg/dL (ref 8.9–10.3)
Chloride: 105 mmol/L (ref 98–111)
Creatinine, Ser: 2.59 mg/dL — ABNORMAL HIGH (ref 0.61–1.24)
GFR calc Af Amer: 31 mL/min — ABNORMAL LOW (ref >60)
GFR calc non Af Amer: 27 mL/min — ABNORMAL LOW (ref >60)
Glucose, Bld: 175 mg/dL — ABNORMAL HIGH (ref 70–99)
Potassium: 3.6 mmol/L (ref 3.5–5.1)
Sodium: 138 mmol/L (ref 135–145)

## 2018-09-06 LAB — MRSA PCR SCREENING: MRSA by PCR: POSITIVE — AB

## 2018-09-06 LAB — SARS CORONAVIRUS 2 BY RT PCR (HOSPITAL ORDER, PERFORMED IN ~~LOC~~ HOSPITAL LAB): SARS Coronavirus 2: NEGATIVE

## 2018-09-06 MED ORDER — TORSEMIDE 20 MG PO TABS: 20.0000 mg | ORAL_TABLET | Freq: Two times a day (BID) | ORAL | Status: DC

## 2018-09-06 MED ORDER — ONDANSETRON HCL 4 MG PO TABS: 4.0000 mg | ORAL_TABLET | Freq: Four times a day (QID) | ORAL | Status: DC | PRN

## 2018-09-06 MED ORDER — PIPERACILLIN-TAZOBACTAM 3.375 G IVPB
3.3750 g | Freq: Three times a day (TID) | INTRAVENOUS | Status: DC
  Administered 2018-09-06 (×2): 3.375 g via INTRAVENOUS
  Filled 2018-09-06: qty 50

## 2018-09-06 MED ORDER — CARVEDILOL 3.125 MG PO TABS
3.1250 mg | ORAL_TABLET | Freq: Two times a day (BID) | ORAL | Status: DC
  Administered 2018-09-06 – 2018-09-12 (×13): 3.125 mg via ORAL
  Filled 2018-09-06 (×13): qty 1

## 2018-09-06 MED ORDER — TORSEMIDE 20 MG PO TABS
40.0000 mg | ORAL_TABLET | Freq: Every morning | ORAL | Status: DC
  Administered 2018-09-06 – 2018-09-12 (×6): 40 mg via ORAL
  Filled 2018-09-06 (×6): qty 2

## 2018-09-06 MED ORDER — PREDNISONE 10 MG PO TABS
5.0000 mg | ORAL_TABLET | Freq: Every day | ORAL | Status: DC
  Administered 2018-09-07 – 2018-09-12 (×5): 5 mg via ORAL
  Filled 2018-09-06 (×5): qty 1

## 2018-09-06 MED ORDER — TORSEMIDE 20 MG PO TABS
20.0000 mg | ORAL_TABLET | Freq: Every evening | ORAL | Status: DC
  Administered 2018-09-06 – 2018-09-11 (×6): 20 mg via ORAL
  Filled 2018-09-06 (×6): qty 1

## 2018-09-06 MED ORDER — ACETAMINOPHEN 325 MG PO TABS
650.0000 mg | ORAL_TABLET | Freq: Four times a day (QID) | ORAL | Status: DC | PRN
  Administered 2018-09-06 – 2018-09-12 (×21): 650 mg via ORAL
  Filled 2018-09-06 (×23): qty 2

## 2018-09-06 MED ORDER — INSULIN ASPART 100 UNIT/ML ~~LOC~~ SOLN
0.0000 [IU] | Freq: Every day | SUBCUTANEOUS | Status: DC
  Administered 2018-09-07: 22:00:00 2 [IU] via SUBCUTANEOUS
  Administered 2018-09-08: 22:00:00 4 [IU] via SUBCUTANEOUS
  Administered 2018-09-09: 2 [IU] via SUBCUTANEOUS
  Administered 2018-09-11: 3 [IU] via SUBCUTANEOUS
  Filled 2018-09-06 (×5): qty 1

## 2018-09-06 MED ORDER — OCUVITE-LUTEIN PO CAPS
1.0000 | ORAL_CAPSULE | Freq: Every day | ORAL | Status: DC
  Administered 2018-09-07 – 2018-09-12 (×4): 1 via ORAL
  Filled 2018-09-06 (×6): qty 1

## 2018-09-06 MED ORDER — SODIUM CHLORIDE 0.9 % IV SOLN
2.0000 g | INTRAVENOUS | Status: DC
  Administered 2018-09-06 – 2018-09-12 (×7): 2 g via INTRAVENOUS
  Filled 2018-09-06: qty 20
  Filled 2018-09-06 (×3): qty 2
  Filled 2018-09-06: qty 20
  Filled 2018-09-06 (×3): qty 2

## 2018-09-06 MED ORDER — VANCOMYCIN HCL IN DEXTROSE 750-5 MG/150ML-% IV SOLN
750.0000 mg | INTRAVENOUS | Status: DC
  Filled 2018-09-06: qty 150

## 2018-09-06 MED ORDER — HEPARIN SODIUM (PORCINE) 5000 UNIT/ML IJ SOLN
5000.0000 [IU] | Freq: Three times a day (TID) | INTRAMUSCULAR | Status: DC
  Filled 2018-09-06 (×7): qty 1

## 2018-09-06 MED ORDER — INSULIN GLARGINE 100 UNIT/ML ~~LOC~~ SOLN
20.0000 [IU] | Freq: Every morning | SUBCUTANEOUS | Status: DC
  Administered 2018-09-09 – 2018-09-12 (×4): 20 [IU] via SUBCUTANEOUS
  Filled 2018-09-06 (×8): qty 0.2

## 2018-09-06 MED ORDER — ASPIRIN EC 81 MG PO TBEC
81.0000 mg | DELAYED_RELEASE_TABLET | Freq: Every day | ORAL | Status: DC
  Administered 2018-09-06 – 2018-09-12 (×5): 81 mg via ORAL
  Filled 2018-09-06 (×6): qty 1

## 2018-09-06 MED ORDER — OXYCODONE HCL 5 MG PO TABS
5.0000 mg | ORAL_TABLET | ORAL | Status: DC | PRN
  Administered 2018-09-06 – 2018-09-12 (×22): 5 mg via ORAL
  Filled 2018-09-06 (×24): qty 1

## 2018-09-06 MED ORDER — GABAPENTIN 300 MG PO CAPS
400.0000 mg | ORAL_CAPSULE | Freq: Three times a day (TID) | ORAL | Status: DC
  Administered 2018-09-06 (×2): 400 mg via ORAL
  Filled 2018-09-06 (×2): qty 1

## 2018-09-06 MED ORDER — AMIODARONE HCL 200 MG PO TABS
200.0000 mg | ORAL_TABLET | Freq: Two times a day (BID) | ORAL | Status: DC
  Administered 2018-09-06 – 2018-09-12 (×11): 200 mg via ORAL
  Filled 2018-09-06 (×12): qty 1

## 2018-09-06 MED ORDER — ACETAMINOPHEN 650 MG RE SUPP: 650.0000 mg | Freq: Four times a day (QID) | RECTAL | Status: DC | PRN

## 2018-09-06 MED ORDER — INSULIN ASPART 100 UNIT/ML ~~LOC~~ SOLN
0.0000 [IU] | Freq: Three times a day (TID) | SUBCUTANEOUS | Status: DC
  Administered 2018-09-06: 1 [IU] via SUBCUTANEOUS
  Administered 2018-09-06: 18:00:00 3 [IU] via SUBCUTANEOUS
  Administered 2018-09-07 (×2): 1 [IU] via SUBCUTANEOUS
  Administered 2018-09-09: 09:00:00 3 [IU] via SUBCUTANEOUS
  Administered 2018-09-09 (×2): 2 [IU] via SUBCUTANEOUS
  Administered 2018-09-10 (×3): 3 [IU] via SUBCUTANEOUS
  Administered 2018-09-11: 2 [IU] via SUBCUTANEOUS
  Administered 2018-09-12 (×2): 1 [IU] via SUBCUTANEOUS
  Filled 2018-09-06 (×13): qty 1

## 2018-09-06 MED ORDER — ONDANSETRON HCL 4 MG/2ML IJ SOLN
4.0000 mg | Freq: Four times a day (QID) | INTRAMUSCULAR | Status: DC | PRN
  Administered 2018-09-08: 4 mg via INTRAVENOUS

## 2018-09-06 MED ORDER — GABAPENTIN 300 MG PO CAPS
300.0000 mg | ORAL_CAPSULE | Freq: Three times a day (TID) | ORAL | Status: DC
  Administered 2018-09-06 – 2018-09-12 (×16): 300 mg via ORAL
  Filled 2018-09-06 (×16): qty 1

## 2018-09-06 MED ORDER — SODIUM CHLORIDE 0.9 % IV SOLN
INTRAVENOUS | Status: DC | PRN
  Administered 2018-09-06: 500 mL via INTRAVENOUS
  Administered 2018-09-08: 14:00:00 via INTRAVENOUS

## 2018-09-06 MED ORDER — AMIODARONE HCL 200 MG PO TABS
200.0000 mg | ORAL_TABLET | Freq: Every day | ORAL | Status: DC
  Administered 2018-09-06: 200 mg via ORAL
  Filled 2018-09-06: qty 1

## 2018-09-06 MED ORDER — ENSURE MAX PROTEIN PO LIQD
11.0000 [oz_av] | Freq: Three times a day (TID) | ORAL | Status: DC
  Administered 2018-09-07: 09:00:00 11 [oz_av] via ORAL
  Filled 2018-09-06: qty 330

## 2018-09-06 NOTE — Progress Notes (Signed)
Pharmacy Antibiotic Note  Paul [name]. is a 53 y.o. male admitted on 09/05/2018 with cellulitis.  Pharmacy has been consulted for vanc/zosyn dosing.  Plan: Patient received vanc 2g IV load, ceftriaxone 2g IV x 1 in ED  Vancomycin 750 mg IV Q 24 hrs. Goal AUC 400-550. Expected AUC: 520.4 SCr used: 2.85 Cssmin: 15.4  Will start zosyn 3.375g IV q8h and will continue to monitor renal function and adjust doses as appropriate.  Height: 5\' 9"  (175.3 cm) Weight: 217 lb (98.4 kg) IBW/kg (Calculated) : 70.7  Temp (24hrs), Avg:98.2 F (36.8 C), Min:97.9 F (36.6 C), Max:98.4 F (36.9 C)  Recent Labs  Lab 09/05/18 1754  WBC 12.8*  CREATININE 2.85*    Estimated Creatinine Clearance: 34.7 mL/min (A) (by C-G formula based on SCr of 2.85 mg/dL (H)).    Allergies  Allergen Reactions  . Ibuprofen Other (See Comments)    Heart problems  . Baclofen Other (See Comments)  . Metformin Diarrhea  . Nsaids     Due to kidney and heart problems per pt    Thank you for allowing pharmacy to be a part of this patient's care.  Tobie Lords, PharmD, BCPS Clinical Pharmacist 09/06/2018

## 2018-09-06 NOTE — Progress Notes (Signed)
Inpatient Diabetes Program Recommendations  AACE/ADA: New Consensus Statement on Inpatient Glycemic Control (2015)  Target Ranges:  Prepandial:   less than 140 mg/dL      Peak postprandial:   less than 180 mg/dL (1-2 hours)      Critically ill patients:  140 - 180 mg/dL   Lab Results  Component Value Date   GLUCAP 107 (H) 09/06/2018   HGBA1C 7.9 (H) 05/25/2018    Review of Glycemic Control Results for CAPRICE, WASKO (MRN [id number]) as of 09/06/2018 13:13  Ref. Range 09/06/2018 08:24 09/06/2018 11:35  Glucose-Capillary Latest Ref Range: 70 - 99 mg/dL 133 (H) 107 (H)   Diabetes history: DM 2 Outpatient Diabetes medications:  Lantus 20 units q HS Current orders for Inpatient glycemic control:  Novolog sensitive tid with meals and HS Lantus 20 units q AM  Inpatient Diabetes Program Recommendations:    Agree with current orders.  Will follow.   Thanks,  Adah Perl, RN, BC-ADM Inpatient Diabetes Coordinator Pager [phone] (8a-5p)

## 2018-09-06 NOTE — Progress Notes (Addendum)
[place] at [facility]                                                                                                                                                                                  Patient Demographics   Paul [name], is a 53 y.o. male, DOB - 1965-07-27, KYH:[id number]  Admit date - 09/05/2018   Admitting Physician Lance Coon, MD  Outpatient Primary MD for the patient is Tracie Harrier, MD   LOS - 0  Subjective: Patient is known from me from this admission Patient was noted to have drainage by home health care nurse x-ray suggestive of progressive infection Patient also had a wound VAC that was taken off.  Review of Systems:   CONSTITUTIONAL: No documented fever. No fatigue, weakness. No weight gain, no weight loss.  EYES: No blurry or double vision.  ENT: No tinnitus. No postnasal drip. No redness of the oropharynx.  RESPIRATORY: No cough, no wheeze, no hemoptysis. No dyspnea.  CARDIOVASCULAR: No chest pain. No orthopnea. No palpitations. No syncope.  GASTROINTESTINAL: No nausea, no vomiting or diarrhea. No abdominal pain. No melena or hematochezia.  GENITOURINARY: No dysuria or hematuria.  ENDOCRINE: No polyuria or nocturia. No heat or cold intolerance.  HEMATOLOGY: No anemia. No bruising. No bleeding.  INTEGUMENTARY: Positive drainage from the foot MUSCULOSKELETAL: No arthritis. No swelling. No gout.  NEUROLOGIC: No numbness, tingling, or ataxia. No seizure-type activity.  PSYCHIATRIC: No anxiety. No insomnia. No ADD.    Vitals:   Vitals:   09/05/18 2300 09/06/18 0301 09/06/18 0605 09/06/18 0900  BP: 134/82 (!) 142/82 95/67 (!) 142/74  Pulse:  94 76 92  Resp:  18 18 16   Temp:  97.9 F (36.6 C) 97.9 F (36.6 C) 98.2 F (36.8 C)  TempSrc:  Oral Oral Oral  SpO2:  100% 95% 100%  Weight:  97.5 kg    Height:  5\' 9"  (1.753 m)      Wt Readings from Last 3 Encounters:  09/06/18 97.5 kg  07/25/18 94.8 kg  06/08/18 100.8 kg      Intake/Output Summary (Last 24 hours) at 09/06/2018 1129 Last data filed at 09/06/2018 0900 Gross per 24 hour  Intake 541.86 ml  Output 300 ml  Net 241.86 ml    Physical Exam:   GENERAL: Pleasant-appearing in no apparent distress.  HEAD, EYES, EARS, NOSE AND THROAT: Atraumatic, normocephalic. Extraocular muscles are intact. Pupils equal and reactive to light. Sclerae anicteric. No conjunctival injection. No oro-pharyngeal erythema.  NECK: Supple. There is no jugular venous distention. No bruits, no lymphadenopathy, no thyromegaly.  HEART: Regular rate and rhythm,. No murmurs, no rubs, no clicks.  LUNGS:  Clear to auscultation bilaterally. No rales or rhonchi. No wheezes.  ABDOMEN: Soft, flat, nontender, nondistended. Has good bowel sounds. No hepatosplenomegaly appreciated.  EXTREMITIES: No evidence of any cyanosis, clubbing, or peripheral edema.  +2 pedal and radial pulses bilaterally.  NEUROLOGIC: The patient is alert, awake, and oriented x3 with no focal motor or sensory deficits appreciated bilaterally.  SKIN: Both feet dressing in place.  Psych: Not anxious, depressed LN: No inguinal LN enlargement    Antibiotics   Anti-infectives (From admission, onward)   Start     Dose/Rate Route Frequency Ordered Stop   09/07/18 0130  vancomycin (VANCOCIN) IVPB 750 mg/150 ml premix     750 mg 150 mL/hr over 60 Minutes Intravenous Every 24 hours 09/06/18 0353     09/06/18 0400  piperacillin-tazobactam (ZOSYN) IVPB 3.375 g     3.375 g 12.5 mL/hr over 240 Minutes Intravenous Every 8 hours 09/06/18 0353     09/05/18 2345  vancomycin (VANCOCIN) 2,000 mg in sodium chloride 0.9 % 500 mL IVPB     2,000 mg 250 mL/hr over 120 Minutes Intravenous  Once 09/05/18 2338 09/06/18 0329   09/05/18 2330  vancomycin (VANCOCIN) IVPB 1000 mg/200 mL premix  Status:  Discontinued     1,000 mg 200 mL/hr over 60 Minutes Intravenous  Once 09/05/18 2316 09/05/18 2338   09/05/18 2330  cefTRIAXone (ROCEPHIN) 2 g  in sodium chloride 0.9 % 100 mL IVPB     2 g 200 mL/hr over 30 Minutes Intravenous  Once 09/05/18 2316 09/06/18 0129      Medications   Scheduled Meds: . amiodarone  200 mg Oral Daily  . aspirin EC  81 mg Oral Daily  . carvedilol  3.125 mg Oral BID  . gabapentin  400 mg Oral TID  . heparin  5,000 Units Subcutaneous Q8H  . insulin aspart  0-5 Units Subcutaneous QHS  . insulin aspart  0-9 Units Subcutaneous TID WC  . insulin glargine  20 Units Subcutaneous q morning - 10a  . torsemide  40 mg Oral q morning - 10a   And  . torsemide  20 mg Oral QPM   Continuous Infusions: . sodium chloride 500 mL (09/06/18 0539)  . piperacillin-tazobactam (ZOSYN)  IV 3.375 g (09/06/18 0542)  . [START ON 09/07/2018] vancomycin     PRN Meds:.sodium chloride, acetaminophen **OR** acetaminophen, ondansetron **OR** ondansetron (ZOFRAN) IV, oxyCODONE   Data Review:   Micro Results Recent Results (from the past 240 hour(s))  SARS Coronavirus 2 (CEPHEID - Performed in [facility] hospital lab), Hosp Order     Status: None   Collection Time: 09/05/18 11:49 PM  Result Value Ref Range Status   SARS Coronavirus 2 NEGATIVE NEGATIVE Final    Comment: (NOTE) If result is NEGATIVE SARS-CoV-2 target nucleic acids are NOT DETECTED. The SARS-CoV-2 RNA is generally detectable in upper and lower  respiratory specimens during the acute phase of infection. The lowest  concentration of SARS-CoV-2 viral copies this assay can detect is 250  copies / mL. A negative result does not preclude SARS-CoV-2 infection  and should not be used as the sole basis for treatment or other  patient management decisions.  A negative result may occur with  improper specimen collection / handling, submission of specimen other  than nasopharyngeal swab, presence of viral mutation(s) within the  areas targeted by this assay, and inadequate number of viral copies  (<250 copies / mL). A negative result must be combined with clinical   observations,  patient history, and epidemiological information. If result is POSITIVE SARS-CoV-2 target nucleic acids are DETECTED. The SARS-CoV-2 RNA is generally detectable in upper and lower  respiratory specimens dur ing the acute phase of infection.  Positive  results are indicative of active infection with SARS-CoV-2.  Clinical  correlation with patient history and other diagnostic information is  necessary to determine patient infection status.  Positive results do  not rule out bacterial infection or co-infection with other viruses. If result is PRESUMPTIVE POSTIVE SARS-CoV-2 nucleic acids MAY BE PRESENT.   A presumptive positive result was obtained on the submitted specimen  and confirmed on repeat testing.  While 2019 novel coronavirus  (SARS-CoV-2) nucleic acids may be present in the submitted sample  additional confirmatory testing may be necessary for epidemiological  and / or clinical management purposes  to differentiate between  SARS-CoV-2 and other Sarbecovirus currently known to infect humans.  If clinically indicated additional testing with an alternate test  methodology [phone]) is advised. The SARS-CoV-2 RNA is generally  detectable in upper and lower respiratory sp ecimens during the acute  phase of infection. The expected result is Negative. Fact Sheet for Patients:  StrictlyIdeas.no Fact Sheet for Healthcare Providers: BankingDealers.co.za This test is not yet approved or cleared by the [place] FDA and has been authorized for detection and/or diagnosis of SARS-CoV-2 by FDA under an Emergency Use Authorization (EUA).  This EUA will remain in effect (meaning this test can be used) for the duration of the COVID-19 declaration under Section 564(b)(1) of the Act, 21 U.S.C. section 360bbb-3(b)(1), unless the authorization is terminated or revoked sooner. Performed at [facility], [address] [place], [place] [address]   MRSA PCR Screening     Status: Abnormal   Collection Time: 09/06/18  4:35 AM  Result Value Ref Range Status   MRSA by PCR POSITIVE (A) NEGATIVE Final    Comment:        The GeneXpert MRSA Assay (FDA approved for NASAL specimens only), is one component of a comprehensive MRSA colonization surveillance program. It is not intended to diagnose MRSA infection nor to guide or monitor treatment for MRSA infections. RESULT CALLED TO, READ BACK BY AND VERIFIED WITH: JENNIFER COLE @0812  ON 09/06/2018 BY FMW Performed at [facility], [address] [place], [place] [address]     Radiology Reports Dg Foot 2 Views Right  Result Date: 09/05/2018 CLINICAL DATA:  53 y/o M; wound evaluation. 07/28/2018 calcaneal osteotomy and debridement. EXAM: RIGHT FOOT - 2 VIEW COMPARISON:  07/25/2018 right ankle radiographs. 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 Large dorsal calcaneal soft tissue post debridement, increased in size of soft tissue defect from prior CT of the ankle. Additionally, there is increased cortical lucency along the subjacent calcaneus. Vascular calcifications noted. IMPRESSION: Dorsal calcaneus soft tissue ulceration and subjacent bony cortical lucencies are increased in comparison with the prior CT of ankle which may represent progressive infection and/or interval postsurgical changes. Electronically Signed   By: Kristine Garbe M.D.   On: 09/05/2018 22:27     CBC Recent Labs  Lab 09/05/18 1754 09/06/18 0411  WBC 12.8* 15.7*  HGB 7.7* 8.7*  HCT 26.0* 29.2*  PLT 327 390  MCV 86.4 86.1  MCH 25.6* 25.7*  MCHC 29.6* 29.8*  RDW 17.5* 17.6*  LYMPHSABS 0.9  --   MONOABS 0.7  --   EOSABS 0.0  --   BASOSABS 0.0  --  Chemistries  Recent Labs  Lab 09/05/18 1754 09/06/18 0411  NA 136 138  K 3.7 3.6  CL 103 105  CO2 20* 25  GLUCOSE 280* 175*  BUN 60* 56*   CREATININE 2.85* 2.59*  CALCIUM 8.9 8.9  AST 15  --   ALT 9  --   ALKPHOS 99  --   BILITOT 0.5  --    ------------------------------------------------------------------------------------------------------------------ estimated creatinine clearance is 38 mL/min (A) (by C-G formula based on SCr of 2.59 mg/dL (H)). ------------------------------------------------------------------------------------------------------------------ No results for input(s): HGBA1C in the last 72 hours. ------------------------------------------------------------------------------------------------------------------ No results for input(s): CHOL, HDL, LDLCALC, TRIG, CHOLHDL, LDLDIRECT in the last 72 hours. ------------------------------------------------------------------------------------------------------------------ No results for input(s): TSH, T4TOTAL, T3FREE, THYROIDAB in the last 72 hours.  Invalid input(s): FREET3 ------------------------------------------------------------------------------------------------------------------ No results for input(s): VITAMINB12, FOLATE, FERRITIN, TIBC, IRON, RETICCTPCT in the last 72 hours.  Coagulation profile No results for input(s): INR, PROTIME in the last 168 hours.  No results for input(s): DDIMER in the last 72 hours.  Cardiac Enzymes No results for input(s): CKMB, TROPONINI, MYOGLOBIN in the last 168 hours.  Invalid input(s): CK ------------------------------------------------------------------------------------------------------------------ Invalid input(s): Scott  Patient is 53 year old with recurrent foot infection  1.  Acute osteomyelitis of right foot: Continue IV antibiotics follow cultures 2.   HTN (hypertension) -home dose antihypertensives 3.   Diabetes ([place]) -sliding scale insulin 4.   CAD (coronary artery disease) -continue home meds 5.  Chronic systolic CHF (congestive heart failure) ([place]) -continue home meds 6.  CKD (chronic kidney disease), stage IV ([place]) -at baseline, avoid nephrotoxins and monitor      Code Status Orders  (From admission, onward)         Start     Ordered   09/06/18 0259  Full code  Continuous     09/06/18 0258        Code Status History    Date Active Date Inactive Code Status Order ID Comments User Context   07/25/2018 1717 08/02/2018 1919 Full Code [id number]  Dustin Flock, MD Inpatient   05/25/2018 0352 05/29/2018 2022 Full Code [id number]  Hillary Bow, MD ED   10/09/2016 1003 10/09/2016 1340 Full Code [id number]  Samara Deist, [place] Inpatient   08/22/2016 0042 08/27/2016 1649 Full Code [id number]  Lance Coon, MD Inpatient   07/18/2014 1818 07/20/2014 2002 Full Code [id number]  Leighton Parody, PA-C Inpatient           Consults podiatry  DVT Prophylaxis  Lovenox    Lab Results  Component Value Date   PLT 390 09/06/2018     Time Spent in minutes  68min 9am to 945am Greater than 50% of time spent in care coordination and counseling patient regarding the condition and plan of care.   Dustin Flock M.D on 09/06/2018 at 11:29 AM  Between 7am to 6pm - Pager - [phone]  After 6pm go to [email or web address] - Proofreader  [employer]   Office  [phone]

## 2018-09-06 NOTE — ED Notes (Signed)
ED TO INPATIENT HANDOFF REPORT  ED Nurse Name and Phone #: Anson Crofts Name/Age/Gender Paul [name] 53 y.o. male Room/Bed: ED10A/ED10A  Code Status   Code Status: Prior  Home/SNF/Other [place] Patient oriented to: self, place, time and situation Is this baseline? Yes   Triage Complete: Triage complete  Chief Complaint Post Op Problem  Triage Note Patient reports having surgery on bilateral feet approximately 1 month ago. Patient reports when he had dressing changed today, his nurse noticed an odor to right foot and he was instructed by PCP to come have wound evaluated. Patient states he recently finished oral antibiotics. Denies fever or chills at home.    Allergies Allergies  Allergen Reactions  . Ibuprofen Other (See Comments)    Heart problems  . Baclofen Other (See Comments)  . Metformin Diarrhea  . Nsaids     Due to kidney and heart problems per pt    Level of Care/Admitting Diagnosis ED Disposition    ED Disposition Condition [facility]: Center [100120]  Level of Care: Med-Surg [16]  Covid Evaluation: N/A  Diagnosis: Acute osteomyelitis of right ankle or foot [facility]) [[id number]]  Admitting Physician: Lance Coon [[id number]]  Attending Physician: Lance Coon [phone]  Estimated length of stay: past midnight tomorrow  Certification:: I certify this patient will need inpatient services for at least 2 midnights  PT Class (Do Not Modify): Inpatient [101]  PT Acc Code (Do Not Modify): Private [1]       B Medical/Surgery History Past Medical History:  Diagnosis Date  . Anemia   . Cardiac defibrillator in place    a. Biotronik LUmax 540 DRT, (ser # [id number]).  . Carotid arterial disease ([place])    a. s/p prior LICA stenting;  b. 09/8848 Carotid U/S: 40-59% bilat ICA stenosis. Patent LICA stent.  . CHF (congestive heart failure) ([place])   . CKD (chronic kidney disease), stage III ([place])   . Coronary artery  disease    a. 2010 s/p CABG x 3.  . DDD (degenerative disc disease), lumbosacral    L5-S1  . Diabetes ([place])    Lantus at bedtime  . Gangrene of toe of right foot ([place])   . Gout of left hand 10/06/2016  . HFrEF (heart failure with reduced ejection fraction) ([place])    a. 07/2016 Echo: EF 25-30%, diff HK, mild MR, mildly dil LA, mod reduced RV fxn, PASP 89mmHg.  [place] Kitchen Hypertension    takes Coreg daily  . Ischemic cardiomyopathy    a. 07/2016 Echo: EF 25-30%, diff HK.  . Myocardial infarction ([place]) 2009  . Peripheral vascular disease ([place])   . Sleep apnea    sleep study yr ago-unable to afford cpap  . Stroke [facility]) 09   no weakness   Past Surgical History:  Procedure Laterality Date  . AMPUTATION TOE Right 08/22/2016   Procedure: AMPUTATION TOE;  Surgeon: Samara Deist, DPM;  Location: [facility] ORS;  Service: Podiatry;  Laterality: Right;  . AMPUTATION TOE Right 10/09/2016   Procedure: AMPUTATION TOE-RIGHT 2ND MPJ;  Surgeon: Samara Deist, DPM;  Location: [facility] OR[facility];  Service: Podiatry;  Laterality: Right;  . APLIGRAFT PLACEMENT Right 10/09/2016   Procedure: APLIGRAFT PLACEMENT;  Surgeon: Samara Deist, DPM;  Location: [facility] OR[facility];  Service: Podiatry;  Laterality: Right;  . CALCANEAL OSTEOTOMY Bilateral 07/28/2018   Procedure: RIGHT CALCANECTOMY BILATERAL DEBRIDEMENT OF ULCERS ON HEELS;  Surgeon: Albertine Patricia, DPM;  Location: [facility] OR[facility];  Service: Podiatry;  Laterality: Bilateral;  . CARDIAC DEFIBRILLATOR PLACEMENT  2011  . CAROTID ENDARTERECTOMY Left   . CHOLECYSTECTOMY  2010  . CORONARY ARTERY BYPASS GRAFT  2010   CABG x 3   . GRAFT APPLICATION Right 0/63/0160   Procedure: FULL THICKNESS SKIN GRAFT-RIGHT FOOT;  Surgeon: Algernon Huxley, MD;  Location: [facility] ORS;  Service: Vascular;  Laterality: Right;  . HIP SURGERY Right 1994  . INCISION AND DRAINAGE OF WOUND Right 08/22/2016   Procedure: IRRIGATION AND DEBRIDEMENT WOUND and wound vac placement;  Surgeon: Samara Deist, DPM;  Location: [facility] OR[facility];   Service: Podiatry;  Laterality: Right;  . LOWER EXTREMITY ANGIOGRAPHY Right 08/24/2016   Procedure: Lower Extremity Angiography;  Surgeon: Algernon Huxley, MD;  Location: [place] [facility];  Service: Cardiovascular;  Laterality: Right;  . LOWER EXTREMITY ANGIOGRAPHY Right 07/27/2018   Procedure: RIGHT Lower Extremity Angiography;  Surgeon: Algernon Huxley, MD;  Location: [place] [facility];  Service: Cardiovascular;  Laterality: Right;  . MASS EXCISION Right 07/18/2014   Procedure: EXCISION HETEROTOPIC BONE RIGHT HIP;  Surgeon: Frederik Pear, MD;  Location: [place];  Service: Orthopedics;  Laterality: Right;  . Open Heart Surgery  2010   x 3  . WOUND DEBRIDEMENT Right 10/09/2016   Procedure: DEBRIDEMENT WOUND;  Surgeon: Samara Deist, DPM;  Location: [facility];  Service: Podiatry;  Laterality: Right;     A IV Location/Drains/Wounds Patient Lines/Drains/Airways Status   Active Line/Drains/Airways    Name:   Placement date:   Placement time:   Site:   Days:   Peripheral IV 09/06/18   09/06/18    0050    -   less than 1   Negative Pressure Wound Therapy Heel Bilateral   07/28/18    0843    -   40   Incision (Closed) 07/28/18 Foot Left   07/28/18    0834     40   Incision (Closed) 07/28/18 Foot Right   07/28/18    -     40          Intake/Output Last 24 hours No intake or output data in the 24 hours ending 09/06/18 0200  Labs/Imaging Results for orders placed or performed during the hospital encounter of 09/05/18 (from the past 48 hour(s))  Comprehensive metabolic panel     Status: Abnormal   Collection Time: 09/05/18  5:54 PM  Result Value Ref Range   Sodium 136 135 - 145 mmol/L   Potassium 3.7 3.5 - 5.1 mmol/L   Chloride 103 98 - 111 mmol/L   CO2 20 (L) 22 - 32 mmol/L   Glucose, Bld 280 (H) 70 - 99 mg/dL   BUN 60 (H) 6 - 20 mg/dL   Creatinine, Ser 2.85 (H) 0.61 - 1.24 mg/dL   Calcium 8.9 8.9 - 10.3 mg/dL   Total Protein 7.3 6.5 - 8.1 g/dL   Albumin 3.5 3.5 - 5.0 g/dL   AST 15 15 - 41  U/L   ALT 9 0 - 44 U/L   Alkaline Phosphatase 99 38 - 126 U/L   Total Bilirubin 0.5 0.3 - 1.2 mg/dL   GFR calc non Af Amer 24 (L) >60 mL/min   GFR calc Af Amer 28 (L) >60 mL/min   Anion gap 13 5 - 15    Comment: Performed at [facility], [address], [place], [place] [address]  CBC with Differential     Status: Abnormal   Collection Time: 09/05/18  5:54 PM  Result Value Ref Range   WBC 12.8 (H) 4.0 - 10.5 K/uL   RBC 3.01 (L) 4.22 - 5.81 MIL/uL   Hemoglobin 7.7 (L) 13.0 - 17.0 g/dL   HCT 26.0 (L) 39.0 - 52.0 %   MCV 86.4 80.0 - 100.0 fL   MCH 25.6 (L) 26.0 - 34.0 pg   MCHC 29.6 (L) 30.0 - 36.0 g/dL   RDW 17.5 (H) 11.5 - 15.5 %   Platelets 327 150 - 400 K/uL   nRBC 0.0 0.0 - 0.2 %   Neutrophils Relative % 87 %   Neutro Abs 11.1 (H) 1.7 - 7.7 K/uL   Lymphocytes Relative 7 %   Lymphs Abs 0.9 0.7 - 4.0 K/uL   Monocytes Relative 6 %   Monocytes Absolute 0.7 0.1 - 1.0 K/uL   Eosinophils Relative 0 %   Eosinophils Absolute 0.0 0.0 - 0.5 K/uL   Basophils Relative 0 %   Basophils Absolute 0.0 0.0 - 0.1 K/uL   Immature Granulocytes 0 %   Abs Immature Granulocytes 0.05 0.00 - 0.07 K/uL    Comment: Performed at [facility], [address], [place], [place] [address]  SARS Coronavirus 2 (CEPHEID - Performed in [place] hospital lab), Hosp Order     Status: None   Collection Time: 09/05/18 11:49 PM  Result Value Ref Range   SARS Coronavirus 2 NEGATIVE NEGATIVE    Comment: (NOTE) If result is NEGATIVE SARS-CoV-2 target nucleic acids are NOT DETECTED. The SARS-CoV-2 RNA is generally detectable in upper and lower  respiratory specimens during the acute phase of infection. The lowest  concentration of SARS-CoV-2 viral copies this assay can detect is 250  copies / mL. A negative result does not preclude SARS-CoV-2 infection  and should not be used as the sole basis for treatment or other  patient management decisions.  A negative result may occur with  improper  specimen collection / handling, submission of specimen other  than nasopharyngeal swab, presence of viral mutation(s) within the  areas targeted by this assay, and inadequate number of viral copies  (<250 copies / mL). A negative result must be combined with clinical  observations, patient history, and epidemiological information. If result is POSITIVE SARS-CoV-2 target nucleic acids are DETECTED. The SARS-CoV-2 RNA is generally detectable in upper and lower  respiratory specimens dur ing the acute phase of infection.  Positive  results are indicative of active infection with SARS-CoV-2.  Clinical  correlation with patient history and other diagnostic information is  necessary to determine patient infection status.  Positive results do  not rule out bacterial infection or co-infection with other viruses. If result is PRESUMPTIVE POSTIVE SARS-CoV-2 nucleic acids MAY BE PRESENT.   A presumptive positive result was obtained on the submitted specimen  and confirmed on repeat testing.  While 2019 novel coronavirus  (SARS-CoV-2) nucleic acids may be present in the submitted sample  additional confirmatory testing may be necessary for epidemiological  and / or clinical management purposes  to differentiate between  SARS-CoV-2 and other Sarbecovirus currently known to infect humans.  If clinically indicated additional testing with an alternate test  methodology [id number]) is advised. The SARS-CoV-2 RNA is generally  detectable in upper and lower respiratory sp ecimens during the acute  phase of infection. The expected result is Negative. Fact Sheet for Patients:  StrictlyIdeas.no Fact Sheet for Healthcare Providers: BankingDealers.co.za This test is not yet approved or cleared by the [place] FDA and has been authorized for detection  and/or diagnosis of SARS-CoV-2 by FDA under an Emergency Use Authorization (EUA).  This EUA will remain in  effect (meaning this test can be used) for the duration of the COVID-19 declaration under Section 564(b)(1) of the Act, 21 U.S.C. section 360bbb-3(b)(1), unless the authorization is terminated or revoked sooner. Performed at [facility], [address], [place], [place] [address]    Dg Foot 2 Views Right  Result Date: 09/05/2018 CLINICAL DATA:  53 y/o M; wound evaluation. 07/28/2018 calcaneal osteotomy and debridement. EXAM: RIGHT FOOT - 2 VIEW COMPARISON:  07/25/2018 right ankle radiographs. 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 Large dorsal calcaneal soft tissue post debridement, increased in size of soft tissue defect from prior CT of the ankle. Additionally, there is increased cortical lucency along the subjacent calcaneus. Vascular calcifications noted. IMPRESSION: Dorsal calcaneus soft tissue ulceration and subjacent bony cortical lucencies are increased in comparison with the prior CT of ankle which may represent progressive infection and/or interval postsurgical changes. Electronically Signed   By: Kristine Garbe M.D.   On: 09/05/2018 22:27    Pending Labs Unresulted Labs (From admission, onward)    Start     Ordered   Signed and Held  CBC  ([place])  Once,   R    Comments:  Baseline for heparin therapy IF NOT ALREADY DRAWN.  Notify MD if PLT < 100 K.    Signed and Held   Signed and Held  Creatinine, serum  (hepar[place])  Once,   R    Comments:  Baseline for heparin therapy IF NOT ALREADY DRAWN.    Signed and Held   Signed and Held  Basic metabolic panel  Tomorrow morning,   R     Signed and Held   Signed and Held  CBC  Tomorrow morning,   R     Signed and Held          Vitals/Pain Today's Vitals   09/05/18 1752 09/05/18 2200 09/05/18 2230 09/05/18 2300  BP:  (!) 127/105 133/74 134/82  Pulse:      Resp:      Temp:      TempSrc:      SpO2:      Weight: 98.4 kg     Height: 5\' 9"  (1.753  m)     PainSc:        Isolation Precautions No active isolations  Medications Medications  vancomycin (VANCOCIN) 2,000 mg in sodium chloride 0.9 % 500 mL IVPB (2,000 mg Intravenous New Bag/Given 09/06/18 0129)  oxyCODONE (Oxy IR/ROXICODONE) immediate release tablet 5 mg (has no administration in time range)  cefTRIAXone (ROCEPHIN) 2 g in sodium chloride 0.9 % 100 mL IVPB (0 g Intravenous Stopped 09/06/18 0129)    Mobility walks with device High fall risk   Focused Assessments Wound infection   R Recommendations: See Admitting Provider Note  Report given to:   Additional Notes:  Wound vacs to both feet with wound care nurse in 3 x week (wound vacs off at present)

## 2018-09-06 NOTE — Progress Notes (Signed)
Initial Nutrition Assessment  RD working remotely.  DOCUMENTATION CODES:   Obesity unspecified  INTERVENTION:  Once diet advances recommend Ensure Max Protein po TID, each supplement provides 150 kcal and 30 grams of protein. Patient only wants vanilla (dislikes chocolate).  If patient does not like Ensure Max Protein, recommend trying Glucerna Shake po TID, each supplement provides 220 kcal and 10 grams of protein. Patient willing to try vanilla, strawberry, or butter pecan. Ensure Max Protein provides significantly more protein, so it is preferable over Glucerna.  Recommend Ocuvite daily for wound healing (provides zinc, vitamin A, vitamin C, Vitamin E, copper, and selenium).  NUTRITION DIAGNOSIS:   Increased nutrient needs related to wound healing as evidenced by estimated needs.  GOAL:   Patient will meet greater than or equal to 90% of their needs  MONITOR:   PO intake, Supplement acceptance, Diet advancement, Labs, Weight trends, Skin, I & O's  REASON FOR ASSESSMENT:   Malnutrition Screening Tool    ASSESSMENT:   53 year old male with PMHx of DDD, DM, HTN, hx CVA, sleep apnea, hx placement of cardiac defibrillator in 2011, CAD, gout, hx MI, PVD, anemia, CHF, CKD admitted with acute osteomyelitis of right foot.   Spoke with patient over the phone to obtain nutrition/weight history. He reports he has had a decreased appetite/intake for 2.5 months now. He has just generally not been feeling well. He reports he is drinking about 32 fl oz per day but is having difficulty with eating. He may have some crackers or Cheez-its. He reports he has never liked the food here so he will likely not eat well here once diet is advanced. He tried a vanilla protein shake his mother drinks BID, but did not like them (cannot recall which brand it was). He reports he has had his wound since January. He follows a low-sodium diet at home in setting of his CHF and HTN. He reports he cooks with other  seasoning, though, to give his food flavor and he feels the food here does not have any flavor. Discussed importance of maintaining good nutritional status to promote wound healing.   Patient reports he has been losing weight from not eating well. He reports his weight at one time was 280 lbs. No weight over the past 2 years in chart, so that must have been a while ago. Weight recently appears to fluctuate between 94.8-100.8 kg, which likely represents changes in fluid status.  Medications reviewed and include: amiodarone, carvedilol, gabapentin, Novolog 0-9 units Tid, Novolog 0-5 units QHS, Lantus 20 units daily, torsemide 40 mg every morning and 20 mg every evening, Zosyn, vancomycin.  Labs reviewed: CBG 133, BUN 56, Creatinine 2.59.  NUTRITION - FOCUSED PHYSICAL EXAM:  Unable to complete at this time.  Diet Order:   Diet Order            Diet NPO time specified Except for: Sips with Meds  Diet effective now             EDUCATION NEEDS:   Education needs have been addressed  Skin:  Skin Assessment: Skin Integrity Issues:(unstageable R heel (6cm x 6cm); stg III L heel (5cm x 4.5cm))  Last BM:  09/05/2018 per chart  Height:   Ht Readings from Last 1 Encounters:  09/06/18 5\' 9"  (1.753 m)   Weight:   Wt Readings from Last 1 Encounters:  09/06/18 97.5 kg   Ideal Body Weight:  72.7 kg  BMI:  Body mass index is 31.74  kg/m.  Estimated Nutritional Needs:   Kcal:  3149-7026  Protein:  115-130 grams  Fluid:  per MD  Willey Blade, MS, RD, LDN Office: [phone] Pager: [phone] After Hours/Weekend Pager: [phone]

## 2018-09-06 NOTE — Progress Notes (Addendum)
Pharmacy Antibiotic Note  Paul [name]. is a 53 y.o. male admitted on 09/05/2018 with cellulitis.  Pharmacy has been consulted for vanc/zosyn dosing.  Plan: Patient received vanc 2g IV load, ceftriaxone 2g IV x 1 in ED  Vancomycin 750 mg IV Q 24 hrs. Goal AUC 400-550. Expected AUC: 520.4 SCr used: 2.85 Cssmin: 15.4  05/06 @ 0130 VR 15 mcg/mL appropriate random. Will start the vanc 750 mg IV q24h and will check a peak on the 4th dose and trough prior to 5th dose considering patient has a chronic foot ulcer and may be on vanc longer than 5 days and currently AKI on CKD (improving).   Will start zosyn 3.375g IV q8h and will continue to monitor renal function and adjust doses as appropriate.  Height: 5\' 9"  (175.3 cm) Weight: 214 lb 15.2 oz (97.5 kg) IBW/kg (Calculated) : 70.7  Temp (24hrs), Avg:98.2 F (36.8 C), Min:97.9 F (36.6 C), Max:98.4 F (36.9 C)  Recent Labs  Lab 09/05/18 1754 09/06/18 0411  WBC 12.8* 15.7*  CREATININE 2.85* 2.59*    Estimated Creatinine Clearance: 38 mL/min (A) (by C-G formula based on SCr of 2.59 mg/dL (H)).    Allergies  Allergen Reactions  . Ibuprofen Other (See Comments)    Heart problems  . Baclofen Other (See Comments)  . Metformin Diarrhea  . Nsaids     Due to kidney and heart problems per pt    Thank you for allowing pharmacy to be a part of this patient's care.  Tobie Lords, PharmD, BCPS Clinical Pharmacist 09/07/2018

## 2018-09-06 NOTE — Consult Note (Signed)
[facility] Vascular Consult Note  MRN : [id number]  Paul [name]. is a 53 y.o. (14-Feb-1966) male who presents with chief complaint of  Chief Complaint  Patient presents with  . Wound Infection   History of Present Illness:  The patient is a 53 year old male with a past medical history of CVA in 2009 no residual effects, sleep apnea, MI in 2009, hypertension congestive heart failure, diabetes, degenerative joint disease of the lumbosacral spine, coronary artery disease, chronic kidney disease stage III, carotid stenosis, cardiac defibrillator in place as well as bilateral lower extremity peripheral artery disease status post endovascular intervention in the past.  The patient is well-known to our service and has undergone a right lower extremity angiogram with intervention by Dr. Lucky Cowboy on July 27, 2018: 1. Ultrasound guidance for vascular access left femoral artery 2. Catheter placement into right common femoral artery from left femoral approach 3. Aortogram and selective right lower extremity angiogram 4. Percutaneous transluminal angioplasty of right distal SFA and above knee popliteal artery with a 5 mm diameter by 15 cm length Lutonix drug-coated angioplasty balloon 5.  Percutaneous transluminal angioplasty of the right tibioperoneal trunk and peroneal artery with 3 mm diameter by 22 cm length angioplasty balloon             6.  StarClose closure device left femoral artery  Patient presents to the [facility] emergency department complaining of worsening right heel wound.  The patient has undergone bilateral heel debridements in the past.  He notes that he was healing well with VAC therapy until recently.  The patient has been receiving wound care services from a visiting wound nurse.  The patient notes his nurse providing wound care was recently changed.  He notes multiple  episodes of the VAC to his right heel not being able to be sealed and constantly leaking.  Patient notes his VAC "beeped" for approximately 3 days without any help from his visiting nurse.  Patient is relatively insensate to the bilateral feet however does note progressively worse discomfort to the lateral aspect of the right foot.  Denies any fever, nausea vomiting.  Vascular Surgery was consulted by Dr. Elvina Mattes for further recommendations Current Facility-Administered Medications  Medication Dose Route Frequency Provider Last Rate Last Dose  . 0.9 %  sodium chloride infusion   Intravenous PRN Lance Coon, MD 10 mL/hr at 09/06/18 0539 500 mL at 09/06/18 0539  . acetaminophen (TYLENOL) tablet 650 mg  650 mg Oral Q6H PRN Lance Coon, MD   650 mg at 09/06/18 1135   Or  . acetaminophen (TYLENOL) suppository 650 mg  650 mg Rectal Q6H PRN Lance Coon, MD      . amiodarone (PACERONE) tablet 200 mg  200 mg Oral Daily Lance Coon, MD   200 mg at 09/06/18 1136  . aspirin EC tablet 81 mg  81 mg Oral Daily Lance Coon, MD   81 mg at 09/06/18 4268  . carvedilol (COREG) tablet 3.125 mg  3.125 mg Oral BID Lance Coon, MD   3.125 mg at 09/06/18 1139  . gabapentin (NEURONTIN) capsule 400 mg  400 mg Oral TID Harrie Foreman, MD   400 mg at 09/06/18 3419  . heparin injection 5,000 Units  5,000 Units Subcutaneous Q8H Lance Coon, MD      . insulin aspart (novoLOG) injection 0-5 Units  0-5 Units Subcutaneous QHS Lance Coon, MD      . insulin aspart (novoLOG) injection 0-9 Units  0-9 Units Subcutaneous TID WC Lance Coon, MD   1 Units at 09/06/18 [phone]  . insulin glargine (LANTUS) injection 20 Units  20 Units Subcutaneous q morning - 10a Lance Coon, MD      . ondansetron [facility]) tablet 4 mg  4 mg Oral Q6H PRN Lance Coon, MD       Or  . ondansetron [facility]) injection 4 mg  4 mg Intravenous Q6H PRN Lance Coon, MD      . oxyCODONE (Oxy IR/ROXICODONE) immediate release tablet 5 mg  5 mg  Oral Q4H PRN Harrie Foreman, MD   5 mg at 09/06/18 1135  . piperacillin-tazobactam (ZOSYN) IVPB 3.375 g  3.375 g Intravenous Driscilla Moats, MD 12.5 mL/hr at 09/06/18 0542 3.375 g at 09/06/18 0542  . torsemide (DEMADEX) tablet 40 mg  40 mg Oral q morning - 10a Lance Coon, MD   40 mg at 09/06/18 1139   And  . torsemide (DEMADEX) tablet 20 mg  20 mg Oral QPM Lance Coon, MD      . Derrill Memo ON 09/07/2018] vancomycin (VANCOCIN) IVPB 750 mg/150 ml premix  750 mg Intravenous Q24H Lance Coon, MD       Past Medical History:  Diagnosis Date  . Anemia   . Cardiac defibrillator in place    a. Biotronik LUmax 540 DRT, (ser # [id number]).  . Carotid arterial disease ([place])    a. s/p prior LICA stenting;  b. 10/5535 Carotid U/S: 40-59% bilat ICA stenosis. Patent LICA stent.  . CHF (congestive heart failure) ([place])   . CKD (chronic kidney disease), stage III ([place])   . Coronary artery disease    a. 2010 s/p CABG x 3.  . DDD (degenerative disc disease), lumbosacral    L5-S1  . Diabetes ([place])    Lantus at bedtime  . Gangrene of toe of right foot ([place])   . Gout of left hand 10/06/2016  . HFrEF (heart failure with reduced ejection fraction) ([place])    a. 07/2016 Echo: EF 25-30%, diff HK, mild MR, mildly dil LA, mod reduced RV fxn, PASP 51mmHg.  Marland Kitchen Hypertension    takes Coreg daily  . Ischemic cardiomyopathy    a. 07/2016 Echo: EF 25-30%, diff HK.  . Myocardial infarction ([place]) 2009  . Peripheral vascular disease ([place])   . Sleep apnea    sleep study yr ago-unable to afford cpap  . Stroke [facility]) 09   no weakness   Past Surgical History:  Procedure Laterality Date  . AMPUTATION TOE Right 08/22/2016   Procedure: AMPUTATION TOE;  Surgeon: Samara Deist, DPM;  Location: [facility] ORS;  Service: Podiatry;  Laterality: Right;  . AMPUTATION TOE Right 10/09/2016   Procedure: AMPUTATION TOE-RIGHT 2ND MPJ;  Surgeon: Samara Deist, DPM;  Location: [facility] ORS;  Service: Podiatry;  Laterality: Right;  . APLIGRAFT  PLACEMENT Right 10/09/2016   Procedure: APLIGRAFT PLACEMENT;  Surgeon: Samara Deist, DPM;  Location: [facility] ORS;  Service: Podiatry;  Laterality: Right;  . CALCANEAL OSTEOTOMY Bilateral 07/28/2018   Procedure: RIGHT CALCANECTOMY BILATERAL DEBRIDEMENT OF ULCERS ON HEELS;  Surgeon: Albertine Patricia, DPM;  Location: [facility] ORS;  Service: Podiatry;  Laterality: Bilateral;  . CARDIAC DEFIBRILLATOR PLACEMENT  2011  . CAROTID ENDARTERECTOMY Left   . CHOLECYSTECTOMY  2010  . CORONARY ARTERY BYPASS GRAFT  2010   CABG x 3   . GRAFT APPLICATION Right 4/82/7078   Procedure: FULL THICKNESS SKIN GRAFT-RIGHT FOOT;  Surgeon: Algernon Huxley, MD;  Location: [facility] ORS;  Service:  Vascular;  Laterality: Right;  . HIP SURGERY Right 1994  . INCISION AND DRAINAGE OF WOUND Right 08/22/2016   Procedure: IRRIGATION AND DEBRIDEMENT WOUND and wound vac placement;  Surgeon: Samara Deist, DPM;  Location: [facility] ORS;  Service: Podiatry;  Laterality: Right;  . LOWER EXTREMITY ANGIOGRAPHY Right 08/24/2016   Procedure: Lower Extremity Angiography;  Surgeon: Algernon Huxley, MD;  Location: [place] CV LAB;  Service: Cardiovascular;  Laterality: Right;  . LOWER EXTREMITY ANGIOGRAPHY Right 07/27/2018   Procedure: RIGHT Lower Extremity Angiography;  Surgeon: Algernon Huxley, MD;  Location: [place] CV LAB;  Service: Cardiovascular;  Laterality: Right;  . MASS EXCISION Right 07/18/2014   Procedure: EXCISION HETEROTOPIC BONE RIGHT HIP;  Surgeon: Frederik Pear, MD;  Location: [place];  Service: Orthopedics;  Laterality: Right;  . Open Heart Surgery  2010   x 3  . WOUND DEBRIDEMENT Right 10/09/2016   Procedure: DEBRIDEMENT WOUND;  Surgeon: Samara Deist, DPM;  Location: [facility] ORS;  Service: Podiatry;  Laterality: Right;   Social History Social History   Tobacco Use  . Smoking status: Never Smoker  . Smokeless tobacco: Never Used  Substance Use Topics  . Alcohol use: No  . Drug use: No   Family History Family History  Problem Relation Age  of Onset  . Hypertension Other   . Diabetes Other   . Diabetes Father   . Hyperlipidemia Father   . Hypertension Father   . Hyperlipidemia Sister   . Hypertension Sister   . Diabetes Sister   . Diabetes Brother   . Hypertension Brother   . Hyperlipidemia Brother   Denies family history of peripheral artery disease, venous disease and/or bleeding/clotting disorders.  Allergies  Allergen Reactions  . Ibuprofen Other (See Comments)    Heart problems  . Baclofen Other (See Comments)  . Metformin Diarrhea  . Nsaids     Due to kidney and heart problems per pt   REVIEW OF SYSTEMS (Negative unless checked)  Constitutional: [] Weight loss  [] Fever  [] Chills Cardiac: [] Chest pain   [] Chest pressure   [] Palpitations   [] Shortness of breath when laying flat   [] Shortness of breath at rest   [] Shortness of breath with exertion. Vascular:  [] Pain in legs with walking   [] Pain in legs at rest   [] Pain in legs when laying flat   [] Claudication   [] Pain in feet when walking  [x] Pain in feet at rest  [x] Pain in feet when laying flat   [] History of DVT   [] Phlebitis   [] Swelling in legs   [] Varicose veins   [] Non-healing ulcers Pulmonary:   [] Uses home oxygen   [] Productive cough   [] Hemoptysis   [] Wheeze  [] COPD   [] Asthma Neurologic:  [] Dizziness  [] Blackouts   [] Seizures   [] History of stroke   [] History of TIA  [] Aphasia   [] Temporary blindness   [] Dysphagia   [] Weakness or numbness in arms   [] Weakness or numbness in legs Musculoskeletal:  [] Arthritis   [] Joint swelling   [] Joint pain   [] Low back pain Hematologic:  [] Easy bruising  [] Easy bleeding   [] Hypercoagulable state   [] Anemic  [] Hepatitis Gastrointestinal:  [] Blood in stool   [] Vomiting blood  [] Gastroesophageal reflux/heartburn   [] Difficulty swallowing. Genitourinary:  [] Chronic kidney disease   [] Difficult urination  [] Frequent urination  [] Burning with urination   [] Blood in urine Skin:  [] Rashes   [x] Ulcers    [x] Wounds Psychological:  [] History of anxiety   []  History of major depression.  Physical Examination  Vitals:   09/06/18 0301 09/06/18 0605 09/06/18 0900 09/06/18 1139  BP: (!) 142/82 95/67 (!) 142/74 139/70  Pulse: 94 76 92 99  Resp: 18 18 16 16   Temp: 97.9 F (36.6 C) 97.9 F (36.6 C) 98.2 F (36.8 C) 98.4 F (36.9 C)  TempSrc: Oral Oral Oral Oral  SpO2: 100% 95% 100% 99%  Weight: 97.5 kg     Height: 5\' 9"  (1.753 m)      Body mass index is 31.74 kg/m. Gen:  WD/WN, NAD Head: [place]/AT, No temporalis wasting. Prominent temp pulse not noted. Ear/Nose/Throat: Hearing grossly intact, nares w/o erythema or drainage, oropharynx w/o Erythema/Exudate Eyes: Sclera non-icteric, conjunctiva clear Neck: Trachea midline.  No JVD.  Pulmonary:  Good air movement, respirations not labored, equal bilaterally.  Cardiac: RRR, normal S1, S2. Vascular:  Vessel Right Left  Radial Palpable Palpable  Ulnar Palpable Palpable  Brachial Palpable Palpable  Carotid Palpable, without bruit Palpable, without bruit  Aorta Not palpable N/A  Femoral Palpable Palpable  Popliteal Palpable Palpable  PT Non-Palpable Non-Palpable  DP Non-Palpable Non-Palpable   Right lower extremity: Podiatry dressing intact. Thigh soft, calf soft. Extremity warm distally to toes. Unable to palpate pedal pulses.  Gastrointestinal: soft, non-tender/non-distended. No guarding/reflex.  Musculoskeletal: M/S 5/5 throughout. Moderate edema. Neurologic: Sensation grossly intact in extremities.  Symmetrical.  Speech is fluent. Motor exam as listed above. Psychiatric: Judgment intact, Mood & affect appropriate for pt's clinical situation. Dermatologic: Bilateral heel wounds Lymph : No Cervical, Axillary, or Inguinal lymphadenopathy.  CBC Lab Results  Component Value Date   WBC 15.7 (H) 09/06/2018   HGB 8.7 (L) 09/06/2018   HCT 29.2 (L) 09/06/2018   MCV 86.1 09/06/2018   PLT 390 09/06/2018   BMET    Component Value  Date/Time   NA 138 09/06/2018 0411   NA 139 06/23/2016 1553   K 3.6 09/06/2018 0411   CL 105 09/06/2018 0411   CO2 25 09/06/2018 0411   GLUCOSE 175 (H) 09/06/2018 0411   BUN 56 (H) 09/06/2018 0411   BUN 46 (H) 06/23/2016 1553   CREATININE 2.59 (H) 09/06/2018 0411   CALCIUM 8.9 09/06/2018 0411   GFRNONAA 27 (L) 09/06/2018 0411   GFRAA 31 (L) 09/06/2018 0411   Estimated Creatinine Clearance: 38 mL/min (A) (by C-G formula based on SCr of 2.59 mg/dL (H)).  COAG Lab Results  Component Value Date   INR 1.18 05/25/2018   INR 1.18 07/16/2014   Radiology Dg Foot 2 Views Right  Result Date: 09/05/2018 CLINICAL DATA:  53 y/o M; wound evaluation. 07/28/2018 calcaneal osteotomy and debridement. EXAM: RIGHT FOOT - 2 VIEW COMPARISON:  07/25/2018 right ankle radiographs. 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 Large dorsal calcaneal soft tissue post debridement, increased in size of soft tissue defect from prior CT of the ankle. Additionally, there is increased cortical lucency along the subjacent calcaneus. Vascular calcifications noted. IMPRESSION: Dorsal calcaneus soft tissue ulceration and subjacent bony cortical lucencies are increased in comparison with the prior CT of ankle which may represent progressive infection and/or interval postsurgical changes. Electronically Signed   By: Kristine Garbe M.D.   On: 09/05/2018 22:27   Assessment/Plan The patient is a 53 year old male with a past medical history of CVA in 2009 no residual effects, sleep apnea, MI in 2009, hypertension congestive heart failure, diabetes, degenerative joint disease of the lumbosacral spine, coronary artery disease, chronic kidney disease stage III, carotid stenosis, cardiac defibrillator in place  as well as bilateral lower extremity peripheral artery disease status post endovascular intervention in the past. 1. PAD: Patient has a past medical history of  peripheral artery disease requiring endovascular intervention.  He has multiple risk factors for peripheral artery disease.  Presents with worsening wound to the right heel.  Unable to palpate pedal pulses on exam.  Recommend right lower extremity angiogram with possible intervention and attempt to assess the patient's anatomy and degree of peripheral artery disease.  If appropriate, as an attempt to revascularize the leg can take place at that time.  Procedure, risks and benefits explained to the patient.  All questions answered.  Patient agrees to proceed. 2. Carotid Stenosis: Last duplex was completed on Sep 04, 2016 and was notable for "heterogeneous plaque, bilaterally. 40-59% bilateral ICA stenosis, s/p left carotid stent. >50% LECA stenosis. Patent vertebral arteries with antegrade flow. Normal subclavian arteries, bilaterally."  Patient should have follow-up at least annually however has not had any recent imaging.  Will order a carotid duplex while in house to assess any progression of his disease.  He is currently asymptomatic however has had a CVA in the past (2009).  3.  Hypertension: On appropriate medications. Encouraged good control as its slows the progression of atherosclerotic disease 4.  Diabetes: On appropriate medications. Encouraged good control as its slows the progression of atherosclerotic disease and directly affects wound healing.  Discussed with Dr. Francene Castle, PA-C  09/06/2018 3:13 PM  This note was created with Dragon medical transcription system.  Any error is purely unintentional.

## 2018-09-06 NOTE — Consult Note (Signed)
NAME: Paul [name].  DOB: January 15, 1966  MRN: [id number]  Date/Time: 09/06/2018 6:51 PM  REQUESTING PROVIDER:troxler Subjective:  REASON FOR CONSULT: heel ulcers Paul [name]. is a 53 y.o. 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 Pt known to me from previous admission  Recent hospitalization at [facility] between 07/25/18-08/02/18 for b/l heel ulcer and underwentdebridement. Culture was positive for MRSA and proteus and he received IV vanco and unasyn in house and was changed to linezolid until 4/6 and levaquin unil 4/13 on discharge . He also was having wound vac- He was doing fine after completing antibiotic and yesterday the  Nurse who came to his residence noted some odor on wound change and he was asked by his PCP to go to the ED. He did not have any fever or chills  In  Sept 2019 ( 01/1818)he was in [place] with syncope, he was noted to have high fever, had MSSA bacteremia, infection of the rt hallux for which he had amputation, The wound culture was positive for MSSA, TEE was negative. He was seen by ID and sent to rehab to complete 6 weeks of IV cefazolin.  He had a power line ( because of CKD) Because of worsening creatinine ( from 1.88 to 3.03) cefazolin was changed to Daptomycin. He was in rehab until Jan 2020. During that time he was in bed a lot and his heels were touching the foot board and he says that started the ulcers. He was admitted to [facility] 1/21-1/26 with b/l heel wounds and was treated with IV antibiotic initially and then discharged on PO. He was followed by Troxler as OP   Medical history CAD s/p CABG Gangrene of foot HTN DM ASCVD Combined systolic and diastolic CHF/AICD CKD Gout Carotid artery disease ( proir LICA stent)   PSH Amputation all toes rt foot AICD CABG Cholecystectomy Carotid endarterectomy   SH  Non smoker    Lives in a boarding home  Family History  Problem Relation Age of Onset  . Hypertension Other   . Diabetes Other   . Diabetes Father   . Hyperlipidemia Father   . Hypertension Father   . Hyperlipidemia Sister   . Hypertension Sister   . Diabetes Sister   . Diabetes Brother   . Hypertension Brother   . Hyperlipidemia Brother    Allergies  Allergen Reactions  . Ibuprofen Other (See Comments)    Heart problems  . Baclofen Other (See Comments)  . Metformin Diarrhea  . Nsaids     Due to kidney and heart problems per pt   I ? Current Facility-Administered Medications  Medication Dose Route Frequency Provider Last Rate Last Dose  . 0.9 %  sodium chloride infusion   Intravenous PRN Lance Coon, MD   Stopped at 09/06/18 1459  . acetaminophen (TYLENOL) tablet 650 mg  650 mg Oral Q6H PRN Lance Coon, MD   650 mg at 09/06/18 1135   Or  . acetaminophen (TYLENOL) suppository 650 mg  650 mg Rectal Q6H PRN Lance Coon, MD      . amiodarone (PACERONE) tablet 200 mg  200 mg Oral BID Dustin Flock, MD      . aspirin EC tablet 81 mg  81 mg Oral Daily Lance Coon, MD   81 mg at 09/06/18 5732  . carvedilol (COREG) tablet 3.125 mg  3.125 mg Oral BID Lance Coon, MD   3.125 mg at 09/06/18 1139  . gabapentin (NEURONTIN) capsule 300 mg  300 mg Oral TID Dustin Flock, MD      . heparin injection 5,000 Units  5,000 Units Subcutaneous Q8H Lance Coon, MD      . insulin aspart (novoLOG) injection 0-5 Units  0-5 Units Subcutaneous QHS Lance Coon, MD      . insulin aspart (novoLOG) injection 0-9 Units  0-9 Units Subcutaneous TID WC Lance Coon, MD   3 Units at 09/06/18 1745  . insulin glargine (LANTUS) injection 20 Units  20 Units Subcutaneous q morning - 10a Lance Coon, MD      . Derrill Memo ON 09/07/2018] multivitamin-lutein (OCUVITE-LUTEIN) capsule 1 capsule  1 capsule Oral Daily Dustin Flock, MD      . ondansetron [facility]) tablet 4 mg  4 mg Oral Q6H PRN Lance Coon, MD       Or   . ondansetron [facility]) injection 4 mg  4 mg Intravenous Q6H PRN Lance Coon, MD      . oxyCODONE (Oxy IR/ROXICODONE) immediate release tablet 5 mg  5 mg Oral Q4H PRN Harrie Foreman, MD   5 mg at 09/06/18 1135  . piperacillin-tazobactam (ZOSYN) IVPB 3.375 g  3.375 g Intravenous Driscilla Moats, MD 12.5 mL/hr at 09/06/18 1556 3.375 g at 09/06/18 1556  . [START ON 09/07/2018] predniSONE (DELTASONE) tablet 5 mg  5 mg Oral Q breakfast Dustin Flock, MD      . protein supplement (ENSURE MAX) liquid  11 oz Oral TID BM Dustin Flock, MD      . torsemide (DEMADEX) tablet 40 mg  40 mg Oral q morning - 10a Lance Coon, MD   40 mg at 09/06/18 1139   And  . torsemide (DEMADEX) tablet 20 mg  20 mg Oral QPM Lance Coon, MD   20 mg at 09/06/18 1745  . [START ON 09/07/2018] vancomycin (VANCOCIN) IVPB 750 mg/150 ml premix  750 mg Intravenous Q24H Lance Coon, MD         Abtx:  Anti-infectives (From admission, onward)   Start     Dose/Rate Route Frequency Ordered Stop   09/07/18 0130  vancomycin (VANCOCIN) IVPB 750 mg/150 ml premix     750 mg 150 mL/hr over 60 Minutes Intravenous Every 24 hours 09/06/18 0353     09/06/18 0400  piperacillin-tazobactam (ZOSYN) IVPB 3.375 g     3.375 g 12.5 mL/hr over 240 Minutes Intravenous Every 8 hours 09/06/18 0353     09/05/18 2345  vancomycin (VANCOCIN) 2,000 mg in sodium chloride 0.9 % 500 mL IVPB     2,000 mg 250 mL/hr over 120 Minutes Intravenous  Once 09/05/18 2338 09/06/18 0329   09/05/18 2330  vancomycin (VANCOCIN) IVPB 1000 mg/200 mL premix  Status:  Discontinued     1,000 mg 200 mL/hr over 60 Minutes Intravenous  Once 09/05/18 2316 09/05/18 2338   09/05/18 2330  cefTRIAXone (ROCEPHIN) 2 g in sodium chloride 0.9 % 100 mL IVPB     2 g 200 mL/hr over 30 Minutes Intravenous  Once 09/05/18 2316 09/06/18 0129      REVIEW OF SYSTEMS:  Const: negative fever, negative chills, negative weight loss Eyes: negative diplopia or visual changes, negative  eye pain ENT: negative coryza, negative sore throat Resp: negative cough, hemoptysis, dyspnea Cards: negative for chest pain, palpitations, lower extremity edema GU: negative for frequency, dysuria and hematuria GI: Negative for abdominal pain, diarrhea, bleeding,  constipation Skin: negative for rash and pruritus Heme: negative for easy bruising and gum/nose bleeding MS: negative for myalgias, arthralgias, back pain and muscle weakness Neurolo:negative for headaches, dizziness, vertigo, memory problems  Psych: negative for feelings of anxiety, depression  Endocrine: has diabetes Allergy/Immunology- negative for any medication or food allergies ?  Objective:  VITALS:  BP 117/66 (BP Location: Right Arm)   Pulse 89   Temp 98.7 F (37.1 C) (Oral)   Resp 16   Ht 5\' 9"  (1.753 m)   Wt 97.5 kg   SpO2 100%   BMI 31.74 kg/m  PHYSICAL EXAM:  General: Alert, cooperative, no distress, appears stated age.  Head: Normocephalic, without obvious abnormality, atraumatic. Eyes: Conjunctivae clear, anicteric sclerae. Pupils are equal ENT Nares normal. No drainage or sinus tenderness. Lips, mucosa, and tongue normal. No Thrush Neck: Supple, symmetrical, no adenopathy, thyroid: non tender no carotid bruit and no JVD. Back: No CVA tenderness. Lungs: Clear to auscultation bilaterally. No Wheezing or Rhonchi. No rales. Heart: Regular rate and rhythm, no murmur, rub or gallop.ICD present Abdomen: Soft, non-tender,not distended. Bowel sounds normal. No masses Extremities: b/l heel ulcers - does not look grossly infected- minimal slough rt ulcer Rt foot- all toes amputated Edema legs Skin: No rashes or lesions. Or bruising Lymph: Cervical, supraclavicular normal. Neurologic: Grossly non-focal Pertinent Labs Lab Results CBC    Component Value Date/Time   WBC 15.7 (H) 09/06/2018 0411   RBC 3.39 (L) 09/06/2018 0411   HGB 8.7 (L) 09/06/2018 0411   HCT 29.2 (L) 09/06/2018 0411   PLT 390  09/06/2018 0411   MCV 86.1 09/06/2018 0411   MCH 25.7 (L) 09/06/2018 0411   MCHC 29.8 (L) 09/06/2018 0411   RDW 17.6 (H) 09/06/2018 0411   LYMPHSABS 0.9 09/05/2018 1754   MONOABS 0.7 09/05/2018 1754   EOSABS 0.0 09/05/2018 1754   BASOSABS 0.0 09/05/2018 1754    CMP Latest Ref Rng & Units 09/06/2018 09/05/2018 08/01/2018  Glucose 70 - 99 mg/dL 175(H) 280(H) 130(H)  BUN 6 - 20 mg/dL 56(H) 60(H) 79(H)  Creatinine 0.61 - 1.24 mg/dL 2.59(H) 2.85(H) 3.58(H)  Sodium 135 - 145 mmol/L 138 136 137  Potassium 3.5 - 5.1 mmol/L 3.6 3.7 4.7  Chloride 98 - 111 mmol/L 105 103 99  CO2 22 - 32 mmol/L 25 20(L) 26  Calcium 8.9 - 10.3 mg/dL 8.9 8.9 8.7(L)  Total Protein 6.5 - 8.1 g/dL - 7.3 -  Total Bilirubin 0.3 - 1.2 mg/dL - 0.5 -  Alkaline Phos 38 - 126 U/L - 99 -  AST 15 - 41 U/L - 15 -  ALT 0 - 44 U/L - 9 -      Microbiology: Recent Results (from the past 240 hour(s))  SARS Coronavirus 2 (CEPHEID - Performed in [place] hospital lab), Hosp Order     Status: None   Collection Time: 09/05/18 11:49 PM  Result Value Ref Range Status   SARS Coronavirus 2 NEGATIVE NEGATIVE Final    Comment: (NOTE) If result is NEGATIVE SARS-CoV-2 target nucleic acids are NOT DETECTED. The SARS-CoV-2 RNA is generally detectable in upper and lower  respiratory specimens during the acute phase of infection. The lowest  concentration of SARS-CoV-2 viral copies this assay can detect is 250  copies / mL. A negative result does not preclude SARS-CoV-2 infection  and should not be used as the sole basis for treatment or other  patient management decisions.  A negative result may occur with  improper specimen collection /  handling, submission of specimen other  than nasopharyngeal swab, presence of viral mutation(s) within the  areas targeted by this assay, and inadequate number of viral copies  (<250 copies / mL). A negative result must be combined with clinical  observations, patient history, and epidemiological  information. If result is POSITIVE SARS-CoV-2 target nucleic acids are DETECTED. The SARS-CoV-2 RNA is generally detectable in upper and lower  respiratory specimens dur ing the acute phase of infection.  Positive  results are indicative of active infection with SARS-CoV-2.  Clinical  correlation with patient history and other diagnostic information is  necessary to determine patient infection status.  Positive results do  not rule out bacterial infection or co-infection with other viruses. If result is PRESUMPTIVE POSTIVE SARS-CoV-2 nucleic acids MAY BE PRESENT.   A presumptive positive result was obtained on the submitted specimen  and confirmed on repeat testing.  While 2019 novel coronavirus  (SARS-CoV-2) nucleic acids may be present in the submitted sample  additional confirmatory testing may be necessary for epidemiological  and / or clinical management purposes  to differentiate between  SARS-CoV-2 and other Sarbecovirus currently known to infect humans.  If clinically indicated additional testing with an alternate test  methodology [phone]) is advised. The SARS-CoV-2 RNA is generally  detectable in upper and lower respiratory sp ecimens during the acute  phase of infection. The expected result is Negative. Fact Sheet for Patients:  StrictlyIdeas.no Fact Sheet for Healthcare Providers: BankingDealers.co.za This test is not yet approved or cleared by the [place] FDA and has been authorized for detection and/or diagnosis of SARS-CoV-2 by FDA under an Emergency Use Authorization (EUA).  This EUA will remain in effect (meaning this test can be used) for the duration of the COVID-19 declaration under Section 564(b)(1) of the Act, 21 U.S.C. section 360bbb-3(b)(1), unless the authorization is terminated or revoked sooner. Performed at [facility], [address] [place], [place] [address]   MRSA PCR Screening      Status: Abnormal   Collection Time: 09/06/18  4:35 AM  Result Value Ref Range Status   MRSA by PCR POSITIVE (A) NEGATIVE Final    Comment:        The GeneXpert MRSA Assay (FDA approved for NASAL specimens only), is one component of a comprehensive MRSA colonization surveillance program. It is not intended to diagnose MRSA infection nor to guide or monitor treatment for MRSA infections. RESULT CALLED TO, READ BACK BY AND VERIFIED WITH: JENNIFER COLE @0812  ON 09/06/2018 BY FMW Performed at [facility], [address] [place], [place] [address]     IMAGING RESULTS: I have personally reviewed the films ? Impression/Recommendation 53 y.o. 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   Diabetic foot infection with b/l heel ulcers- recent debridement in March with treatment of infection due to MRSA and proteus with oral linezolid and levaquin Now getting IV vanco and zosyn- change the latter to ceftriaxone   CKD- avoid vanco/zosyn combo Anemia of chronic disease and also due to CKD  PAD- he will be evaluated by Dr.Dew tomorrow ? DM- ?on insulin  CAD ?s/p CABG On AICD ? ? ___________________________________________________ Discussed with patient, requesting provider Note:  This document was prepared using Dragon voice recognition software and may include unintentional dictation errors.

## 2018-09-06 NOTE — H&P (Signed)
[facility] NAME: Paul [name]    MR#:  [id number]  DATE OF BIRTH:  [birth year]  DATE OF ADMISSION:  09/05/2018  PRIMARY CARE PHYSICIAN: Tracie Harrier, MD   REQUESTING/REFERRING PHYSICIAN: Joni Fears, MD  CHIEF COMPLAINT:   Chief Complaint  Patient presents with  . Wound Infection    HISTORY OF PRESENT ILLNESS:  Paul [name]  is a 53 y.o. male who presents with chief complaint as above.  Patient presents the ED with worsening pain of his right heel.  He has a known diabetic foot wound for which she has been seeing podiatry.  Scan in the ED indicates osteomyelitis.  He was started on antibiotics and hospitalist were called for admission  PAST MEDICAL HISTORY:   Past Medical History:  Diagnosis Date  . Anemia   . Cardiac defibrillator in place    a. Biotronik LUmax 540 DRT, (ser # [id number]).  . Carotid arterial disease ([place])    a. s/p prior LICA stenting;  b. 01/6788 Carotid U/S: 40-59% bilat ICA stenosis. Patent LICA stent.  . CHF (congestive heart failure) ([place])   . CKD (chronic kidney disease), stage III ([place])   . Coronary artery disease    a. 2010 s/p CABG x 3.  . DDD (degenerative disc disease), lumbosacral    L5-S1  . Diabetes ([place])    Lantus at bedtime  . Gangrene of toe of right foot ([place])   . Gout of left hand 10/06/2016  . HFrEF (heart failure with reduced ejection fraction) ([place])    a. 07/2016 Echo: EF 25-30%, diff HK, mild MR, mildly dil LA, mod reduced RV fxn, PASP 41mmHg.  [place] Kitchen Hypertension    takes Coreg daily  . Ischemic cardiomyopathy    a. 07/2016 Echo: EF 25-30%, diff HK.  . Myocardial infarction ([place]) 2009  . Peripheral vascular disease ([place])   . Sleep apnea    sleep study yr ago-unable to afford cpap  . Stroke [facility]) 09   no weakness     PAST SURGICAL HISTORY:   Past Surgical History:  Procedure Laterality Date  . AMPUTATION TOE Right 08/22/2016   Procedure: AMPUTATION TOE;  Surgeon: Samara Deist, DPM;  Location: [facility];  Service: Podiatry;  Laterality: Right;  . AMPUTATION TOE Right 10/09/2016   Procedure: AMPUTATION TOE-RIGHT 2ND MPJ;  Surgeon: Samara Deist, DPM;  Location: [facility];  Service: Podiatry;  Laterality: Right;  . APLIGRAFT PLACEMENT Right 10/09/2016   Procedure: APLIGRAFT PLACEMENT;  Surgeon: Samara Deist, DPM;  Location: [facility];  Service: Podiatry;  Laterality: Right;  . CALCANEAL OSTEOTOMY Bilateral 07/28/2018   Procedure: RIGHT CALCANECTOMY BILATERAL DEBRIDEMENT OF ULCERS ON HEELS;  Surgeon: Albertine Patricia, DPM;  Location: [facility];  Service: Podiatry;  Laterality: Bilateral;  . CARDIAC DEFIBRILLATOR PLACEMENT  2011  . CAROTID ENDARTERECTOMY Left   . CHOLECYSTECTOMY  2010  . CORONARY ARTERY BYPASS GRAFT  2010   CABG x 3   . GRAFT APPLICATION Right 3/81/0175   Procedure: FULL THICKNESS SKIN GRAFT-RIGHT FOOT;  Surgeon: Algernon Huxley, MD;  Location: [facility];  Service: Vascular;  Laterality: Right;  . HIP SURGERY Right 1994  . INCISION AND DRAINAGE OF WOUND Right 08/22/2016   Procedure: IRRIGATION AND DEBRIDEMENT WOUND and wound vac placement;  Surgeon: Samara Deist, DPM;  Location: [facility];  Service: Podiatry;  Laterality: Right;  . LOWER EXTREMITY ANGIOGRAPHY Right 08/24/2016   Procedure: Lower Extremity Angiography;  Surgeon: Algernon Huxley, MD;  Location: [facility];  Service: Cardiovascular;  Laterality: Right;  . LOWER EXTREMITY ANGIOGRAPHY Right 07/27/2018   Procedure: RIGHT Lower Extremity Angiography;  Surgeon: Algernon Huxley, MD;  Location: [facility];  Service: Cardiovascular;  Laterality: Right;  . MASS EXCISION Right 07/18/2014   Procedure: EXCISION HETEROTOPIC BONE RIGHT HIP;  Surgeon: Frederik Pear, MD;  Location: [place];  Service: Orthopedics;  Laterality: Right;  . Open Heart Surgery  2010   x 3  . WOUND DEBRIDEMENT Right 10/09/2016   Procedure: DEBRIDEMENT WOUND;  Surgeon: Samara Deist, DPM;  Location: [facility];  Service:  Podiatry;  Laterality: Right;     SOCIAL HISTORY:   Social History   Tobacco Use  . Smoking status: Never Smoker  . Smokeless tobacco: Never Used  Substance Use Topics  . Alcohol use: No     FAMILY HISTORY:   Family History  Problem Relation Age of Onset  . Hypertension Other   . Diabetes Other   . Diabetes Father   . Hyperlipidemia Father   . Hypertension Father   . Hyperlipidemia Sister   . Hypertension Sister   . Diabetes Sister   . Diabetes Brother   . Hypertension Brother   . Hyperlipidemia Brother      DRUG ALLERGIES:   Allergies  Allergen Reactions  . Ibuprofen Other (See Comments)    Heart problems  . Baclofen Other (See Comments)  . Metformin Diarrhea  . Nsaids     Due to kidney and heart problems per pt    MEDICATIONS AT HOME:   Prior to Admission medications   Medication Sig Start Date End Date Taking? Authorizing Provider  acetaminophen (TYLENOL) 500 MG tablet Take 500 mg by mouth every 6 (six) hours as needed.   Yes [provider]  allopurinol (ZYLOPRIM) 100 MG tablet Take 1 tablet (100 mg total) by mouth daily. 08/02/18  Yes Fritzi Mandes, MD  aspirin EC 81 MG tablet Take 1 tablet (81 mg total) by mouth daily. 10/21/16  Yes Theora Gianotti, NP  carvedilol (COREG) 3.125 MG tablet Take 1 tablet (3.125 mg total) by mouth 2 (two) times daily. 05/29/18  Yes Wieting, [name], MD  Cholecalciferol (VITAMIN D3) 1.25 MG (50000 UT) CAPS Take 1 capsule by mouth once a week. 07/08/18  Yes [provider]  gabapentin (NEURONTIN) 100 MG capsule Take 200 mg by mouth 3 (three) times daily.  03/03/18  Yes [provider]  insulin glargine (LANTUS) 100 unit/mL SOPN Inject 0.2 mLs (20 Units total) into the skin at bedtime. 05/29/18  Yes Wieting, [name], MD  predniSONE (DELTASONE) 5 MG tablet Take 1 tablet (5 mg total) by mouth daily with breakfast. 08/02/18  Yes Fritzi Mandes, MD  torsemide (DEMADEX) 20 MG tablet Take 20-40 mg by mouth 2  (two) times daily. Take 40 mg (two tablets) in the morning and 20 mg (1 tablet) in the evening. 08/22/18  Yes [provider]  acetaminophen (TYLENOL) 325 MG tablet Take 2 tablets (650 mg total) by mouth every 6 (six) hours as needed for mild pain (or Fever >/= 101). Patient not taking: Reported on 09/06/2018 05/29/18   Loletha Grayer, MD  amiodarone (PACERONE) 200 MG tablet Take 200 mg by mouth daily. Take 200 mg twice a day for ten days, then take 200 mg once a day thereafter. 01/15/18   [provider]  apixaban (ELIQUIS) 5 MG TABS tablet Take 1 tablet (5 mg total) by mouth 2 (two) times daily.  Patient not taking: Reported on 07/25/2018 05/29/18   Loletha Grayer, MD    REVIEW OF SYSTEMS:  Review of Systems  Constitutional: Negative for chills, fever, malaise/fatigue and weight loss.  HENT: Negative for ear pain, hearing loss and tinnitus.   Eyes: Negative for blurred vision, double vision, pain and redness.  Respiratory: Negative for cough, hemoptysis and shortness of breath.   Cardiovascular: Negative for chest pain, palpitations, orthopnea and leg swelling.  Gastrointestinal: Negative for abdominal pain, constipation, diarrhea, nausea and vomiting.  Genitourinary: Negative for dysuria, frequency and hematuria.  Musculoskeletal: Negative for back pain, joint pain and neck pain.       Right heel pain and ulcerated wound  Skin:       No acne, rash, or lesions  Neurological: Negative for dizziness, tremors, focal weakness and weakness.  Endo/Heme/Allergies: Negative for polydipsia. Does not bruise/bleed easily.  Psychiatric/Behavioral: Negative for depression. The patient is not nervous/anxious and does not have insomnia.      VITAL SIGNS:   Vitals:   09/05/18 1752 09/05/18 2200 09/05/18 2230 09/05/18 2300  BP:  (!) 127/105 133/74 134/82  Pulse:      Resp:      Temp:      TempSrc:      SpO2:      Weight: 98.4 kg     Height: 5\' 9"  (1.753 m)      Wt Readings from  Last 3 Encounters:  09/05/18 98.4 kg  07/25/18 94.8 kg  06/08/18 100.8 kg    PHYSICAL EXAMINATION:  Physical Exam  Vitals reviewed. Constitutional: He is oriented to person, place, and time. He appears well-developed and well-nourished. No distress.  HENT:  Head: Normocephalic and atraumatic.  Mouth/Throat: Oropharynx is clear and moist.  Eyes: Pupils are equal, round, and reactive to light. Conjunctivae and EOM are normal. No scleral icterus.  Neck: Normal range of motion. Neck supple. No JVD present. No thyromegaly present.  Cardiovascular: Normal rate, regular rhythm and intact distal pulses. Exam reveals no gallop and no friction rub.  No murmur heard. Respiratory: Effort normal and breath sounds normal. No respiratory distress. He has no wheezes. He has no rales.  GI: Soft. Bowel sounds are normal. He exhibits no distension. There is no abdominal tenderness.  Musculoskeletal: Normal range of motion.        General: No edema.     Comments: No arthritis, no gout  Lymphadenopathy:    He has no cervical adenopathy.  Neurological: He is alert and oriented to person, place, and time. No cranial nerve deficit.  No dysarthria, no aphasia  Skin: Skin is warm and dry. No rash noted. There is erythema (Surrounding right heel ulcerated wound).  Psychiatric: He has a normal mood and affect. His behavior is normal. Judgment and thought content normal.    LABORATORY PANEL:   CBC Recent Labs  Lab 09/05/18 1754  WBC 12.8*  HGB 7.7*  HCT 26.0*  PLT 327   ------------------------------------------------------------------------------------------------------------------  Chemistries  Recent Labs  Lab 09/05/18 1754  NA 136  K 3.7  CL 103  CO2 20*  GLUCOSE 280*  BUN 60*  CREATININE 2.85*  CALCIUM 8.9  AST 15  ALT 9  ALKPHOS 99  BILITOT 0.5   ------------------------------------------------------------------------------------------------------------------  Cardiac  Enzymes No results for input(s): TROPONINI in the last 168 hours. ------------------------------------------------------------------------------------------------------------------  RADIOLOGY:  Dg Foot 2 Views Right  Result Date: 09/05/2018 CLINICAL DATA:  53 y/o M; wound evaluation. 07/28/2018 calcaneal osteotomy and debridement. EXAM: RIGHT FOOT -  2 VIEW COMPARISON:  07/25/2018 right ankle radiographs. 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 Large dorsal calcaneal soft tissue post debridement, increased in size of soft tissue defect from prior CT of the ankle. Additionally, there is increased cortical lucency along the subjacent calcaneus. Vascular calcifications noted. IMPRESSION: Dorsal calcaneus soft tissue ulceration and subjacent bony cortical lucencies are increased in comparison with the prior CT of ankle which may represent progressive infection and/or interval postsurgical changes. Electronically Signed   By: Kristine Garbe M.D.   On: 09/05/2018 22:27    EKG:   Orders placed or performed during the hospital encounter of 05/24/18  . ED EKG  . ED EKG  . EKG 12-Lead  . EKG 12-Lead    IMPRESSION AND PLAN:  Principal Problem:   Acute osteomyelitis of right foot (HCC) -IV antibiotics initiated, podiatry consult placed, PRN analgesia Active Problems:   HTN (hypertension) -home dose antihypertensives   Diabetes ([place]) -sliding scale insulin   CAD (coronary artery disease) -continue home meds   Chronic systolic CHF (congestive heart failure) ([place]) -continue home meds   CKD (chronic kidney disease), stage IV ([place]) -at baseline, avoid nephrotoxins and monitor  Chart review performed and case discussed with ED provider. Labs, imaging and/or ECG reviewed by provider and discussed with patient/family. Management plans discussed with the patient and/or family.  COVID-19 status: Tested negative     DVT PROPHYLAXIS:  SubQ heparin  GI PROPHYLAXIS:  PPI   ADMISSION STATUS: Inpatient     CODE STATUS: Full Code Status History    Date Active Date Inactive Code Status Order ID Comments User Context   07/25/2018 1717 08/02/2018 1919 Full Code [id number]  Dustin Flock, MD Inpatient   05/25/2018 0352 05/29/2018 2022 Full Code [id number]  Hillary Bow, MD ED   10/09/2016 1003 10/09/2016 1340 Full Code [id number]  Samara Deist, [facility] Inpatient   08/22/2016 0042 08/27/2016 1649 Full Code [id number]  Lance Coon, MD Inpatient   07/18/2014 1818 07/20/2014 2002 Full Code [id number]  Leighton Parody, PA-C Inpatient      TOTAL TIME TAKING CARE OF THIS PATIENT: 45 minutes.   This patient was evaluated in the context of the global COVID-19 pandemic, which necessitated consideration that the patient might be at risk for infection with the SARS-CoV-2 virus that causes COVID-19. Institutional protocols and algorithms that pertain to the evaluation of patients at risk for COVID-19 are in a state of rapid change based on information released by regulatory bodies including the CDC and federal and state organizations. These policies and algorithms were followed to the best of this provider's knowledge to date during the patient's care at this facility.  Ethlyn Daniels 09/06/2018, 1:26 AM  [facility]  Office  [phone]  CC: Primary care physician; Tracie Harrier, MD  Note:  This document was prepared using Dragon voice recognition software and may include unintentional dictation errors.

## 2018-09-06 NOTE — Consult Note (Signed)
[facility]                                                      Patient Demographics  Paul [name], is a 53 y.o. male   MRN: [id number]   DOB - 04/25/1966  Admit Date - 09/05/2018    Outpatient Primary MD for the patient is Tracie Harrier, MD  Consult requested in the Hospital by Dustin Flock, MD, On 09/06/2018    Reason for consult right heel infection   With History of -  Past Medical History:  Diagnosis Date  . Anemia   . Cardiac defibrillator in place    a. Biotronik LUmax 540 DRT, (ser # [id number]).  . Carotid arterial disease ([place])    a. s/p prior LICA stenting;  b. 06/9796 Carotid U/S: 40-59% bilat ICA stenosis. Patent LICA stent.  . CHF (congestive heart failure) ([place])   . CKD (chronic kidney disease), stage III ([place])   . Coronary artery disease    a. 2010 s/p CABG x 3.  . DDD (degenerative disc disease), lumbosacral    L5-S1  . Diabetes ([place])    Lantus at bedtime  . Gangrene of toe of right foot ([place])   . Gout of left hand 10/06/2016  . HFrEF (heart failure with reduced ejection fraction) ([place])    a. 07/2016 Echo: EF 25-30%, diff HK, mild MR, mildly dil LA, mod reduced RV fxn, PASP 26mmHg.  [place] Kitchen Hypertension    takes Coreg daily  . Ischemic cardiomyopathy    a. 07/2016 Echo: EF 25-30%, diff HK.  . Myocardial infarction ([place]) 2009  . Peripheral vascular disease ([place])   . Sleep apnea    sleep study yr ago-unable to afford cpap  . Stroke [facility]) 09   no weakness      Past Surgical History:  Procedure Laterality Date  . AMPUTATION TOE Right 08/22/2016   Procedure: AMPUTATION TOE;  Surgeon: Samara Deist, DPM;  Location: [facility];  Service: Podiatry;  Laterality: Right;  . AMPUTATION TOE Right 10/09/2016   Procedure: AMPUTATION TOE-RIGHT 2ND MPJ;  Surgeon: Samara Deist, DPM;  Location: [facility];  Service: Podiatry;  Laterality: Right;   . APLIGRAFT PLACEMENT Right 10/09/2016   Procedure: APLIGRAFT PLACEMENT;  Surgeon: Samara Deist, DPM;  Location: [facility];  Service: Podiatry;  Laterality: Right;  . CALCANEAL OSTEOTOMY Bilateral 07/28/2018   Procedure: RIGHT CALCANECTOMY BILATERAL DEBRIDEMENT OF ULCERS ON HEELS;  Surgeon: Albertine Patricia, DPM;  Location: [facility];  Service: Podiatry;  Laterality: Bilateral;  . CARDIAC DEFIBRILLATOR PLACEMENT  2011  . CAROTID ENDARTERECTOMY Left   . CHOLECYSTECTOMY  2010  . CORONARY ARTERY BYPASS GRAFT  2010   CABG x 3   . GRAFT APPLICATION Right 01/23/1940   Procedure: FULL THICKNESS SKIN GRAFT-RIGHT FOOT;  Surgeon: Algernon Huxley, MD;  Location: [facility];  Service: Vascular;  Laterality: Right;  . HIP SURGERY Right 1994  . INCISION AND DRAINAGE OF WOUND Right 08/22/2016   Procedure: IRRIGATION AND DEBRIDEMENT WOUND and wound vac placement;  Surgeon: Samara Deist, DPM;  Location: [facility];  Service: Podiatry;  Laterality: Right;  . LOWER EXTREMITY ANGIOGRAPHY Right 08/24/2016   Procedure: Lower Extremity Angiography;  Surgeon: Algernon Huxley, MD;  Location: [place] [facility];  Service: Cardiovascular;  Laterality: Right;  . LOWER EXTREMITY ANGIOGRAPHY Right 07/27/2018  Procedure: RIGHT Lower Extremity Angiography;  Surgeon: Algernon Huxley, MD;  Location: [place] [facility];  Service: Cardiovascular;  Laterality: Right;  . MASS EXCISION Right 07/18/2014   Procedure: EXCISION HETEROTOPIC BONE RIGHT HIP;  Surgeon: Frederik Pear, MD;  Location: [place];  Service: Orthopedics;  Laterality: Right;  . Open Heart Surgery  2010   x 3  . WOUND DEBRIDEMENT Right 10/09/2016   Procedure: DEBRIDEMENT WOUND;  Surgeon: Samara Deist, DPM;  Location: [facility];  Service: Podiatry;  Laterality: Right;    in for   Chief Complaint  Patient presents with  . Wound Infection     HPI  Paul [name]  is a 53 y.o. male, has had a chronic decubitus ulceration he also is diabetic on the right heel.  I have debrided it  twice in the hospital and is been on a wound VAC for the last month but he continues to have some drainage and infection in the region which is problematic as far as his healing.  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 along with history of diabetes and peripheral arterial disease    Review of Systems: Alert well oriented and pleasant  In addition to the HPI above,  No Fever-chills, No Headache, No changes with Vision or hearing, No problems swallowing food or Liquids, No Chest pain, Cough or Shortness of Breath, No Abdominal pain, No Nausea or Vommitting, Bowel movements are regular, No Blood in stool or Urine, No dysuria, No new skin rashes or bruises, No new joints pains-aches,  No new weakness, tingling, numbness in any extremity, No recent weight gain or loss, No polyuria, polydypsia or polyphagia, No significant Mental Stressors.  A full 10 point Review of Systems was done, except as stated above, all other Review of Systems were negative.   Social History Social History   Tobacco Use  . Smoking status: Never Smoker  . Smokeless tobacco: Never Used  Substance Use Topics  . Alcohol use: No    Family History Family History  Problem Relation Age of Onset  . Hypertension Other   . Diabetes Other   . Diabetes Father   . Hyperlipidemia Father   . Hypertension Father   . Hyperlipidemia Sister   . Hypertension Sister   . Diabetes Sister   . Diabetes Brother   . Hypertension Brother   . Hyperlipidemia Brother     Prior to Admission medications   Medication Sig Start Date End Date Taking? Authorizing Provider  acetaminophen (TYLENOL) 500 MG tablet Take 500 mg by mouth every 6 (six) hours as needed.   Yes [provider]  allopurinol (ZYLOPRIM) 100 MG tablet Take 1 tablet (100 mg total) by mouth daily. 08/02/18  Yes Fritzi Mandes, MD  aspirin EC 81 MG tablet Take 1 tablet (81 mg total) by mouth daily. 10/21/16  Yes Theora Gianotti, NP  carvedilol (COREG) 3.125 MG tablet Take 1 tablet (3.125 mg total) by mouth 2 (two) times daily. 05/29/18  Yes Wieting, [name], MD  Cholecalciferol (VITAMIN D3) 1.25 MG (50000 UT) CAPS Take 1 capsule by mouth once a week. 07/08/18  Yes [provider]  gabapentin (NEURONTIN) 100 MG capsule Take 200 mg by mouth 3 (three) times daily.  03/03/18  Yes [provider]  insulin glargine (LANTUS) 100 unit/mL SOPN Inject 0.2 mLs (20 Units total) into the skin at bedtime. 05/29/18  Yes Wieting, [name], MD  predniSONE (DELTASONE) 5 MG tablet Take 1 tablet (5 mg  total) by mouth daily with breakfast. 08/02/18  Yes Fritzi Mandes, MD  torsemide (DEMADEX) 20 MG tablet Take 20-40 mg by mouth 2 (two) times daily. Take 40 mg (two tablets) in the morning and 20 mg (1 tablet) in the evening. 08/22/18  Yes [provider]  acetaminophen (TYLENOL) 325 MG tablet Take 2 tablets (650 mg total) by mouth every 6 (six) hours as needed for mild pain (or Fever >/= 101). Patient not taking: Reported on 09/06/2018 05/29/18   Loletha Grayer, MD  amiodarone (PACERONE) 200 MG tablet Take 200 mg by mouth daily. Take 200 mg twice a day for ten days, then take 200 mg once a day thereafter. 01/15/18   [provider]  apixaban (ELIQUIS) 5 MG TABS tablet Take 1 tablet (5 mg total) by mouth 2 (two) times daily. Patient not taking: Reported on 07/25/2018 05/29/18   Loletha Grayer, MD    Anti-infectives (From admission, onward)   Start     Dose/Rate Route Frequency Ordered Stop   09/07/18 0130  vancomycin (VANCOCIN) IVPB 750 mg/150 ml premix     750 mg 150 mL/hr over 60 Minutes Intravenous Every 24 hours 09/06/18 0353     09/06/18 0400  piperacillin-tazobactam (ZOSYN) IVPB 3.375 g     3.375 g 12.5 mL/hr over 240 Minutes Intravenous Every 8 hours 09/06/18 0353     09/05/18 2345  vancomycin (VANCOCIN) 2,000 mg in sodium chloride 0.9 % 500 mL IVPB     2,000 mg 250 mL/hr over 120 Minutes Intravenous   Once 09/05/18 2338 09/06/18 0329   09/05/18 2330  vancomycin (VANCOCIN) IVPB 1000 mg/200 mL premix  Status:  Discontinued     1,000 mg 200 mL/hr over 60 Minutes Intravenous  Once 09/05/18 2316 09/05/18 2338   09/05/18 2330  cefTRIAXone (ROCEPHIN) 2 g in sodium chloride 0.9 % 100 mL IVPB     2 g 200 mL/hr over 30 Minutes Intravenous  Once 09/05/18 2316 09/06/18 0129      Scheduled Meds: . amiodarone  200 mg Oral Daily  . aspirin EC  81 mg Oral Daily  . carvedilol  3.125 mg Oral BID  . gabapentin  400 mg Oral TID  . heparin  5,000 Units Subcutaneous Q8H  . insulin aspart  0-5 Units Subcutaneous QHS  . insulin aspart  0-9 Units Subcutaneous TID WC  . insulin glargine  20 Units Subcutaneous q morning - 10a  . torsemide  40 mg Oral q morning - 10a   And  . torsemide  20 mg Oral QPM   Continuous Infusions: . sodium chloride 500 mL (09/06/18 0539)  . piperacillin-tazobactam (ZOSYN)  IV 3.375 g (09/06/18 0542)  . [START ON 09/07/2018] vancomycin     PRN Meds:.sodium chloride, acetaminophen **OR** acetaminophen, ondansetron **OR** ondansetron (ZOFRAN) IV, oxyCODONE  Allergies  Allergen Reactions  . Ibuprofen Other (See Comments)    Heart problems  . Baclofen Other (See Comments)  . Metformin Diarrhea  . Nsaids     Due to kidney and heart problems per pt    Physical Exam  Vitals  Blood pressure 139/70, pulse 99, temperature 98.4 F (36.9 C), temperature source Oral, resp. rate 16, height 5\' 9"  (1.753 m), weight 97.5 kg, SpO2 99 %.  Lower Extremity exam:  Vascular: Difficult to palpate bilateral  Dermatological: Large decubitus ulcer on the right heel approximately 5.5 x 5.2 cm bone was exposed in the region this was has been there since last surgical procedure that we did.  There is a little bit of purulent drainage coming from the proximal Achilles region at this timeframe.  There is also some centralized necrotic tissue but there is some granular tissue on each side of the  central portion.  The left heel has a wound that is continuing to progress and is well granulated and healing nicely.  Both these been treated with wound VAC over the last 2 months.  Neurological: Severe peripheral neuropathy  Ortho: Findings as noted above with possible osteomyelitis in the heel based on x-rays but it is a superficial aspect of the heel bone is noted.  Nephric and soft tissue defect is noted on the posterior right heel  Data Review  CBC Recent Labs  Lab 09/05/18 1754 09/06/18 0411  WBC 12.8* 15.7*  HGB 7.7* 8.7*  HCT 26.0* 29.2*  PLT 327 390  MCV 86.4 86.1  MCH 25.6* 25.7*  MCHC 29.6* 29.8*  RDW 17.5* 17.6*  LYMPHSABS 0.9  --   MONOABS 0.7  --   EOSABS 0.0  --   BASOSABS 0.0  --    ------------------------------------------------------------------------------------------------------------------  Chemistries  Recent Labs  Lab 09/05/18 1754 09/06/18 0411  NA 136 138  K 3.7 3.6  CL 103 105  CO2 20* 25  GLUCOSE 280* 175*  BUN 60* 56*  CREATININE 2.85* 2.59*  CALCIUM 8.9 8.9  AST 15  --   ALT 9  --   ALKPHOS 99  --   BILITOT 0.5  --    ------------------------------------------------------------------------------------------------------------------ estimated creatinine clearance is 38 mL/min (A) (by C-G formula based on SCr of 2.59 mg/dL (H)). ------------------------------------------------------------------------------------------------------------------ No results for input(s): TSH, T4TOTAL, T3FREE, THYROIDAB in the last 72 hours.  Invalid input(s): FREET3 Urinalysis    Component Value Date/Time   COLORURINE YELLOW (A) 05/25/2018 0049   APPEARANCEUR CLEAR (A) 05/25/2018 0049   LABSPEC 1.013 05/25/2018 0049   PHURINE 5.0 05/25/2018 0049   GLUCOSEU 50 (A) 05/25/2018 0049   HGBUR NEGATIVE 05/25/2018 0049   BILIRUBINUR NEGATIVE 05/25/2018 0049   KETONESUR NEGATIVE 05/25/2018 0049   PROTEINUR NEGATIVE 05/25/2018 0049   UROBILINOGEN 1.0  07/16/2014 1558   NITRITE NEGATIVE 05/25/2018 0049   LEUKOCYTESUR NEGATIVE 05/25/2018 0049     Imaging results:   Dg Foot 2 Views Right  Result Date: 09/05/2018 CLINICAL DATA:  53 y/o M; wound evaluation. 07/28/2018 calcaneal osteotomy and debridement. EXAM: RIGHT FOOT - 2 VIEW COMPARISON:  07/25/2018 right ankle radiographs. 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 Large dorsal calcaneal soft tissue post debridement, increased in size of soft tissue defect from prior CT of the ankle. Additionally, there is increased cortical lucency along the subjacent calcaneus. Vascular calcifications noted. IMPRESSION: Dorsal calcaneus soft tissue ulceration and subjacent bony cortical lucencies are increased in comparison with the prior CT of ankle which may represent progressive infection and/or interval postsurgical changes. Electronically Signed   By: Kristine Garbe M.D.   On: 09/05/2018 22:27    Assessment & Plan: I redressed the foot today got a consult for Dr. Tama High as well as for vascular surgery.  He is going to need some further debridement of this bone and see what vascular may be able to do if they need to see him tomorrow try to do a procedure on tomorrow may be something that could help his overall healing.  Surgery was I think regarding have to open up the Achilles tendon more and possibly resect more of it if it is necrotic.  We  also may end up having to remove some of the posterior calcaneus in order to promote healing and perhaps closure of the wound.  If we get things stabilized he might be a candidate for some type of grafting.  Principal Problem:   Acute osteomyelitis of right foot (HCC) Active Problems:   HTN (hypertension)   Diabetes ([place])   CAD (coronary artery disease)   Chronic systolic CHF (congestive heart failure) (HCC)   CKD (chronic kidney disease), stage IV (H[facility])   Acute osteomyelitis of right  ankle or foot (HCC)   Pressure ulcer of heel, right, unstageable ([place])   Family Communication: Plan discussed with patient   Albertine Patricia M.D on 09/06/2018 at 1:09 PM  Thank you for the consult, we will follow the patient with you in the Hospital.

## 2018-09-06 NOTE — Progress Notes (Signed)
Pt has been berating staff about he has been waiting for someone to come to room. Pt has been noted asleep and has not called out.

## 2018-09-07 ENCOUNTER — Inpatient Hospital Stay: Payer: Medicare Other

## 2018-09-07 LAB — BASIC METABOLIC PANEL
Anion gap: 9 (ref 5–15)
BUN: 53 mg/dL — ABNORMAL HIGH (ref 6–20)
CO2: 25 mmol/L (ref 22–32)
Calcium: 8.6 mg/dL — ABNORMAL LOW (ref 8.9–10.3)
Chloride: 106 mmol/L (ref 98–111)
Creatinine, Ser: 2.75 mg/dL — ABNORMAL HIGH (ref 0.61–1.24)
GFR calc Af Amer: 29 mL/min — ABNORMAL LOW (ref >60)
GFR calc non Af Amer: 25 mL/min — ABNORMAL LOW (ref >60)
Glucose, Bld: 109 mg/dL — ABNORMAL HIGH (ref 70–99)
Potassium: 4.1 mmol/L (ref 3.5–5.1)
Sodium: 140 mmol/L (ref 135–145)

## 2018-09-07 LAB — CBC
HCT: 25.3 % — ABNORMAL LOW (ref 39.0–52.0)
Hemoglobin: 7.4 g/dL — ABNORMAL LOW (ref 13.0–17.0)
MCH: 25.3 pg — ABNORMAL LOW (ref 26.0–34.0)
MCHC: 29.2 g/dL — ABNORMAL LOW (ref 30.0–36.0)
MCV: 86.6 fL (ref 80.0–100.0)
Platelets: 329 10*3/uL (ref 150–400)
RBC: 2.92 MIL/uL — ABNORMAL LOW (ref 4.22–5.81)
RDW: 17.7 % — ABNORMAL HIGH (ref 11.5–15.5)
WBC: 12.1 10*3/uL — ABNORMAL HIGH (ref 4.0–10.5)
nRBC: 0 % (ref 0.0–0.2)

## 2018-09-07 LAB — VITAMIN B12: Vitamin B-12: 327 pg/mL (ref 180–914)

## 2018-09-07 LAB — GLUCOSE, CAPILLARY
Glucose-Capillary: 99 mg/dL (ref 70–99)
Glucose-Capillary: 142 mg/dL — ABNORMAL HIGH (ref 70–99)
Glucose-Capillary: 150 mg/dL — ABNORMAL HIGH (ref 70–99)
Glucose-Capillary: 158 mg/dL — ABNORMAL HIGH (ref 70–99)

## 2018-09-07 LAB — FERRITIN: Ferritin: 101 ng/mL (ref 24–336)

## 2018-09-07 LAB — RETICULOCYTES
Immature Retic Fract: 19.7 % — ABNORMAL HIGH (ref 2.3–15.9)
RBC.: 2.88 MIL/uL — ABNORMAL LOW (ref 4.22–5.81)
Retic Count, Absolute: 49.5 10*3/uL (ref 19.0–186.0)
Retic Ct Pct: 1.7 % (ref 0.4–3.1)

## 2018-09-07 LAB — VANCOMYCIN, RANDOM
Vancomycin Rm: 15
Vancomycin Rm: 25

## 2018-09-07 LAB — IRON AND TIBC
Iron: 9 ug/dL — ABNORMAL LOW (ref 45–182)
Saturation Ratios: 4 % — ABNORMAL LOW (ref 17.9–39.5)
TIBC: 204 ug/dL — ABNORMAL LOW (ref 250–450)
UIBC: 195 ug/dL

## 2018-09-07 LAB — FOLATE: Folate: 7.9 ng/mL (ref >5.9)

## 2018-09-07 MED ORDER — VANCOMYCIN HCL IN DEXTROSE 750-5 MG/150ML-% IV SOLN
750.0000 mg | INTRAVENOUS | Status: DC
  Administered 2018-09-07 – 2018-09-10 (×4): 750 mg via INTRAVENOUS
  Filled 2018-09-07 (×5): qty 150

## 2018-09-07 MED ORDER — MUPIROCIN 2 % EX OINT
TOPICAL_OINTMENT | Freq: Two times a day (BID) | CUTANEOUS | Status: AC
  Administered 2018-09-07 – 2018-09-11 (×9): via NASAL
  Filled 2018-09-07: qty 22

## 2018-09-07 NOTE — Progress Notes (Signed)
Marland Kitchen                                                                                                                           [facility] Podiatry                                                      Patient Demographics  Paul [name], is a 53 y.o. male   MRN: [id number]   DOB - April 12, 1966  Admit Date - 09/05/2018    Outpatient Primary MD for the patient is Tracie Harrier, MD  Consult requested in the Hospital by Dustin Flock, MD, On 09/07/2018    Past Medical History:  Diagnosis Date  . Anemia   . Cardiac defibrillator in place    a. Biotronik LUmax 540 DRT, (ser # [id number]).  . Carotid arterial disease ([place])    a. s/p prior LICA stenting;  b. 10/5782 Carotid U/S: 40-59% bilat ICA stenosis. Patent LICA stent.  . CHF (congestive heart failure) ([place])   . CKD (chronic kidney disease), stage III ([place])   . Coronary artery disease    a. 2010 s/p CABG x 3.  . DDD (degenerative disc disease), lumbosacral    L5-S1  . Diabetes ([place])    Lantus at bedtime  . Gangrene of toe of right foot ([place])   . Gout of left hand 10/06/2016  . HFrEF (heart failure with reduced ejection fraction) ([place])    a. 07/2016 Echo: EF 25-30%, diff HK, mild MR, mildly dil LA, mod reduced RV fxn, PASP 66mmHg.  Marland Kitchen Hypertension    takes Coreg daily  . Ischemic cardiomyopathy    a. 07/2016 Echo: EF 25-30%, diff HK.  . Myocardial infarction ([place]) 2009  . Peripheral vascular disease ([place])   . Sleep apnea    sleep study yr ago-unable to afford cpap  . Stroke [facility]) 09   no weakness      Past Surgical History:  Procedure Laterality Date  . AMPUTATION TOE Right 08/22/2016   Procedure: AMPUTATION TOE;  Surgeon: Samara Deist, DPM;  Location: [facility] ORS;  Service: Podiatry;  Laterality: Right;  . AMPUTATION TOE Right 10/09/2016   Procedure: AMPUTATION TOE-RIGHT 2ND MPJ;  Surgeon: Samara Deist, DPM;  Location: [facility] ORS;  Service: Podiatry;  Laterality: Right;  . APLIGRAFT PLACEMENT Right 10/09/2016   Procedure: APLIGRAFT  PLACEMENT;  Surgeon: Samara Deist, DPM;  Location: [facility] ORS;  Service: Podiatry;  Laterality: Right;  . CALCANEAL OSTEOTOMY Bilateral 07/28/2018   Procedure: RIGHT CALCANECTOMY BILATERAL DEBRIDEMENT OF ULCERS ON HEELS;  Surgeon: Albertine Patricia, DPM;  Location: [facility] ORS;  Service: Podiatry;  Laterality: Bilateral;  . CARDIAC DEFIBRILLATOR PLACEMENT  2011  . CAROTID ENDARTERECTOMY Left   . CHOLECYSTECTOMY  2010  . CORONARY ARTERY BYPASS GRAFT  2010   CABG x 3   .  GRAFT APPLICATION Right 9/67/5916   Procedure: FULL THICKNESS SKIN GRAFT-RIGHT FOOT;  Surgeon: Algernon Huxley, MD;  Location: [facility] ORS;  Service: Vascular;  Laterality: Right;  . HIP SURGERY Right 1994  . INCISION AND DRAINAGE OF WOUND Right 08/22/2016   Procedure: IRRIGATION AND DEBRIDEMENT WOUND and wound vac placement;  Surgeon: Samara Deist, DPM;  Location: [facility] ORS;  Service: Podiatry;  Laterality: Right;  . LOWER EXTREMITY ANGIOGRAPHY Right 08/24/2016   Procedure: Lower Extremity Angiography;  Surgeon: Algernon Huxley, MD;  Location: [place] CV LAB;  Service: Cardiovascular;  Laterality: Right;  . LOWER EXTREMITY ANGIOGRAPHY Right 07/27/2018   Procedure: RIGHT Lower Extremity Angiography;  Surgeon: Algernon Huxley, MD;  Location: [place] CV LAB;  Service: Cardiovascular;  Laterality: Right;  . MASS EXCISION Right 07/18/2014   Procedure: EXCISION HETEROTOPIC BONE RIGHT HIP;  Surgeon: Frederik Pear, MD;  Location: [place];  Service: Orthopedics;  Laterality: Right;  . Open Heart Surgery  2010   x 3  . WOUND DEBRIDEMENT Right 10/09/2016   Procedure: DEBRIDEMENT WOUND;  Surgeon: Samara Deist, DPM;  Location: [facility] ORS;  Service: Podiatry;  Laterality: Right;    in for   Chief Complaint  Patient presents with  . Wound Infection     HPI  Paul [name]  is a 53 y.o. male, hospitalized yesterday with infection and a chronic longstanding decubitus heel wound to the right heel.  I been treating him with a wound VAC and IV antibiotics  following a debridement over a month ago and that his heel showed some progress initially but he started develop further infection in the area.    Review of Systems he is alert and well-oriented this morning.  In addition to the HPI above,  No Fever-chills, he has been afebrile. No Headache, No changes with Vision or hearing, No problems swallowing food or Liquids, No Chest pain, Cough or Shortness of Breath, No Abdominal pain, No Nausea or Vommitting, Bowel movements are regular, No Blood in stool or Urine, No dysuria, No new skin rashes or bruises, No new joints pains-aches,  No new weakness, tingling, numbness in any extremity, No recent weight gain or loss, No polyuria, polydypsia or polyphagia, No significant Mental Stressors.  A full 10 point Review of Systems was done, except as stated above, all other Review of Systems were negative.   Social History Social History   Tobacco Use  . Smoking status: Never Smoker  . Smokeless tobacco: Never Used  Substance Use Topics  . Alcohol use: No    Family History Family History  Problem Relation Age of Onset  . Hypertension Other   . Diabetes Other   . Diabetes Father   . Hyperlipidemia Father   . Hypertension Father   . Hyperlipidemia Sister   . Hypertension Sister   . Diabetes Sister   . Diabetes Brother   . Hypertension Brother   . Hyperlipidemia Brother     Prior to Admission medications   Medication Sig Start Date End Date Taking? Authorizing Provider  acetaminophen (TYLENOL) 500 MG tablet Take 500 mg by mouth every 6 (six) hours as needed.   Yes [provider]  allopurinol (ZYLOPRIM) 100 MG tablet Take 1 tablet (100 mg total) by mouth daily. 08/02/18  Yes Fritzi Mandes, MD  aspirin EC 81 MG tablet Take 1 tablet (81 mg total) by mouth daily. 10/21/16  Yes Theora Gianotti, NP  carvedilol (COREG) 3.125 MG tablet Take 1 tablet (3.125 mg total) by  mouth 2 (two) times daily. 05/29/18  Yes Wieting,  [name], MD  Cholecalciferol (VITAMIN D3) 1.25 MG (50000 UT) CAPS Take 1 capsule by mouth once a week. 07/08/18  Yes [provider]  gabapentin (NEURONTIN) 100 MG capsule Take 200 mg by mouth 3 (three) times daily.  03/03/18  Yes [provider]  insulin glargine (LANTUS) 100 unit/mL SOPN Inject 0.2 mLs (20 Units total) into the skin at bedtime. 05/29/18  Yes Wieting, [name], MD  predniSONE (DELTASONE) 5 MG tablet Take 1 tablet (5 mg total) by mouth daily with breakfast. 08/02/18  Yes Fritzi Mandes, MD  torsemide (DEMADEX) 20 MG tablet Take 20-40 mg by mouth 2 (two) times daily. Take 40 mg (two tablets) in the morning and 20 mg (1 tablet) in the evening. 08/22/18  Yes [provider]  acetaminophen (TYLENOL) 325 MG tablet Take 2 tablets (650 mg total) by mouth every 6 (six) hours as needed for mild pain (or Fever >/= 101). Patient not taking: Reported on 09/06/2018 05/29/18   Loletha Grayer, MD  amiodarone (PACERONE) 200 MG tablet Take 200 mg by mouth daily. Take 200 mg twice a day for ten days, then take 200 mg once a day thereafter. 01/15/18   [provider]  apixaban (ELIQUIS) 5 MG TABS tablet Take 1 tablet (5 mg total) by mouth 2 (two) times daily. Patient not taking: Reported on 07/25/2018 05/29/18   Loletha Grayer, MD    Anti-infectives (From admission, onward)   Start     Dose/Rate Route Frequency Ordered Stop   09/07/18 0243  vancomycin (VANCOCIN) IVPB 750 mg/150 ml premix     750 mg 150 mL/hr over 60 Minutes Intravenous Every 24 hours 09/07/18 0244     09/07/18 0130  vancomycin (VANCOCIN) IVPB 750 mg/150 ml premix  Status:  Discontinued     750 mg 150 mL/hr over 60 Minutes Intravenous Every 24 hours 09/06/18 0353 09/07/18 0244   09/06/18 2200  cefTRIAXone (ROCEPHIN) 2 g in sodium chloride 0.9 % 100 mL IVPB     2 g 200 mL/hr over 30 Minutes Intravenous Every 24 hours 09/06/18 2152     09/06/18 0400  piperacillin-tazobactam (ZOSYN) IVPB 3.375 g  Status:   Discontinued     3.375 g 12.5 mL/hr over 240 Minutes Intravenous Every 8 hours 09/06/18 0353 09/06/18 1907   09/05/18 2345  vancomycin (VANCOCIN) 2,000 mg in sodium chloride 0.9 % 500 mL IVPB     2,000 mg 250 mL/hr over 120 Minutes Intravenous  Once 09/05/18 2338 09/06/18 0329   09/05/18 2330  vancomycin (VANCOCIN) IVPB 1000 mg/200 mL premix  Status:  Discontinued     1,000 mg 200 mL/hr over 60 Minutes Intravenous  Once 09/05/18 2316 09/05/18 2338   09/05/18 2330  cefTRIAXone (ROCEPHIN) 2 g in sodium chloride 0.9 % 100 mL IVPB     2 g 200 mL/hr over 30 Minutes Intravenous  Once 09/05/18 2316 09/06/18 0129      Scheduled Meds: . amiodarone  200 mg Oral BID  . aspirin EC  81 mg Oral Daily  . carvedilol  3.125 mg Oral BID  . gabapentin  300 mg Oral TID  . heparin  5,000 Units Subcutaneous Q8H  . insulin aspart  0-5 Units Subcutaneous QHS  . insulin aspart  0-9 Units Subcutaneous TID WC  . insulin glargine  20 Units Subcutaneous q morning - 10a  . multivitamin-lutein  1 capsule Oral Daily  . mupirocin ointment   Nasal BID  .  predniSONE  5 mg Oral Q breakfast  . Ensure Max Protein  11 oz Oral TID BM  . torsemide  40 mg Oral q morning - 10a   And  . torsemide  20 mg Oral QPM   Continuous Infusions: . sodium chloride Stopped (09/06/18 1459)  . cefTRIAXone (ROCEPHIN)  IV 2 g (09/06/18 2239)  . vancomycin 750 mg (09/07/18 0348)   PRN Meds:.sodium chloride, acetaminophen **OR** acetaminophen, ondansetron **OR** ondansetron (ZOFRAN) IV, oxyCODONE  Allergies  Allergen Reactions  . Ibuprofen Other (See Comments)    Heart problems  . Baclofen Other (See Comments)  . Metformin Diarrhea  . Nsaids     Due to kidney and heart problems per pt    Physical Exam  Vitals  Blood pressure 120/71, pulse 100, temperature 98.5 F (36.9 C), temperature source Oral, resp. rate 15, height 5\' 9"  (1.753 m), weight 97.5 kg, SpO2 99 %.  Lower Extremity exam: Both heels evaluated today the left  heel is continue to make progress and shows good signs of healing.  The right heel has some necrosis centrally in the wound as well as some drainage from the proximal Achilles portion of the wound.  I did do a probe and a culture of this today and it probes down to I think the Achilles tendon level in that region.  Does not have a lot of cellulitis and his white count is down today compared to admission.  Still foul odor is noted in the region.  Data Review  CBC Recent Labs  Lab 09/05/18 1754 09/06/18 0411 09/07/18 0435  WBC 12.8* 15.7* 12.1*  HGB 7.7* 8.7* 7.4*  HCT 26.0* 29.2* 25.3*  PLT 327 390 329  MCV 86.4 86.1 86.6  MCH 25.6* 25.7* 25.3*  MCHC 29.6* 29.8* 29.2*  RDW 17.5* 17.6* 17.7*  LYMPHSABS 0.9  --   --   MONOABS 0.7  --   --   EOSABS 0.0  --   --   BASOSABS 0.0  --   --    ------------------------------------------------------------------------------------------------------------------  Chemistries  Recent Labs  Lab 09/05/18 1754 09/06/18 0411 09/07/18 0435  NA 136 138 140  K 3.7 3.6 4.1  CL 103 105 106  CO2 20* 25 25  GLUCOSE 280* 175* 109*  BUN 60* 56* 53*  CREATININE 2.85* 2.59* 2.75*  CALCIUM 8.9 8.9 8.6*  AST 15  --   --   ALT 9  --   --   ALKPHOS 99  --   --   BILITOT 0.5  --   --    ------------------------------------------------------------------------------------  Assessment & Plan: This area needs further debridement.  The bone is appeared fairly clean the whole time but may have some evidence of a little early osteomyelitis in the region.  The main infection I think is within the distal residual portions of the Achilles tendon and these need to be debrided and removed.  I need to open up the more proximal portions of the tendon in order to explore and remove the infected tissue from the area.  Does not seem to have a lot of cellulitis in the region.  Will possibly have to not only remove infected tendon but also remove some of the posterior aspect  of that calcaneus in order to try to promote further healing to the region.  I will plan on doing this tomorrow went over consent form with him today he understands accepts the risk and possible complications associated with surgery and understands he is  at risk for limb loss on this right lower extremity.  Vascular surgery is seen and plan on trying to do something on Friday to see if he is had any further blockages and see if anything else they can do to improve flow to that region.  Principal Problem:   Acute osteomyelitis of right foot ([facility]) Active Problems:   HTN (hypertension)   Diabetes ([place])   CAD (coronary artery disease)   Chronic systolic CHF (congestive heart failure) ([facility])   CKD (chronic kidney disease), stage IV ([facility])   Acute osteomyelitis of right ankle or foot ([facility])   Pressure ulcer of heel, right, unstageable ([place])   Family Communication: Plan discussed with patient  Albertine Patricia M.D on 09/07/2018 at 12:23 PM  Thank you for the consult, we will follow the patient with you in the Hospital.

## 2018-09-07 NOTE — Progress Notes (Signed)
[place] at [facility]                                                                                                                                                                                  Patient Demographics   Paul [name], is a 53 y.o. male, DOB - [birth year], VPX:[id number]  Admit date - 09/05/2018   Admitting Physician Lance Coon, MD  Outpatient Primary MD for the patient is Tracie Harrier, MD   LOS - 1  Subjective: Has some aches in his neck denies any significant leg pain  Review of Systems:   CONSTITUTIONAL: No documented fever. No fatigue, weakness. No weight gain, no weight loss.  EYES: No blurry or double vision.  ENT: No tinnitus. No postnasal drip. No redness of the oropharynx.  RESPIRATORY: No cough, no wheeze, no hemoptysis. No dyspnea.  CARDIOVASCULAR: No chest pain. No orthopnea. No palpitations. No syncope.  GASTROINTESTINAL: No nausea, no vomiting or diarrhea. No abdominal pain. No melena or hematochezia.  GENITOURINARY: No dysuria or hematuria.  ENDOCRINE: No polyuria or nocturia. No heat or cold intolerance.  HEMATOLOGY: No anemia. No bruising. No bleeding.  INTEGUMENTARY: Positive drainage from the foot MUSCULOSKELETAL: No arthritis. No swelling. No gout.  Positive neck pain NEUROLOGIC: No numbness, tingling, or ataxia. No seizure-type activity.  PSYCHIATRIC: No anxiety. No insomnia. No ADD.    Vitals:   Vitals:   09/06/18 1637 09/06/18 1843 09/06/18 2024 09/07/18 0441  BP: (!) 95/53 117/66 108/65 120/71  Pulse: 73 89 96 100  Resp:   15 15  Temp:   98.9 F (37.2 C) 98.5 F (36.9 C)  TempSrc:   Oral Oral  SpO2: 99% 100% 99% 99%  Weight:      Height:        Wt Readings from Last 3 Encounters:  09/06/18 97.5 kg  07/25/18 94.8 kg  06/08/18 100.8 kg     Intake/Output Summary (Last 24 hours) at 09/07/2018 1103 Last data filed at 09/07/2018 1037 Gross per 24 hour  Intake 270.98 ml  Output 1825 ml  Net  -1554.02 ml    Physical Exam:   GENERAL: Pleasant-appearing in no apparent distress.  HEAD, EYES, EARS, NOSE AND THROAT: Atraumatic, normocephalic. Extraocular muscles are intact. Pupils equal and reactive to light. Sclerae anicteric. No conjunctival injection. No oro-pharyngeal erythema.  NECK: Supple. There is no jugular venous distention. No bruits, no lymphadenopathy, no thyromegaly.  HEART: Regular rate and rhythm,. No murmurs, no rubs, no clicks.  LUNGS: Clear to auscultation bilaterally. No rales or rhonchi. No wheezes.  ABDOMEN: Soft, flat, nontender, nondistended. Has good bowel sounds. No hepatosplenomegaly appreciated.  EXTREMITIES: Dressing in place  both lower extremity nEUROLOGIC: The patient is alert, awake, and oriented x3 with no focal motor or sensory deficits appreciated bilaterally.  SKIN: Both feet dressing in place.  Psych: Not anxious, depressed LN: No inguinal LN enlargement    Antibiotics   Anti-infectives (From admission, onward)   Start     Dose/Rate Route Frequency Ordered Stop   09/07/18 0243  vancomycin (VANCOCIN) IVPB 750 mg/150 ml premix     750 mg 150 mL/hr over 60 Minutes Intravenous Every 24 hours 09/07/18 0244     09/07/18 0130  vancomycin (VANCOCIN) IVPB 750 mg/150 ml premix  Status:  Discontinued     750 mg 150 mL/hr over 60 Minutes Intravenous Every 24 hours 09/06/18 0353 09/07/18 0244   09/06/18 2200  cefTRIAXone (ROCEPHIN) 2 g in sodium chloride 0.9 % 100 mL IVPB     2 g 200 mL/hr over 30 Minutes Intravenous Every 24 hours 09/06/18 2152     09/06/18 0400  piperacillin-tazobactam (ZOSYN) IVPB 3.375 g  Status:  Discontinued     3.375 g 12.5 mL/hr over 240 Minutes Intravenous Every 8 hours 09/06/18 0353 09/06/18 1907   09/05/18 2345  vancomycin (VANCOCIN) 2,000 mg in sodium chloride 0.9 % 500 mL IVPB     2,000 mg 250 mL/hr over 120 Minutes Intravenous  Once 09/05/18 2338 09/06/18 0329   09/05/18 2330  vancomycin (VANCOCIN) IVPB 1000 mg/200 mL  premix  Status:  Discontinued     1,000 mg 200 mL/hr over 60 Minutes Intravenous  Once 09/05/18 2316 09/05/18 2338   09/05/18 2330  cefTRIAXone (ROCEPHIN) 2 g in sodium chloride 0.9 % 100 mL IVPB     2 g 200 mL/hr over 30 Minutes Intravenous  Once 09/05/18 2316 09/06/18 0129      Medications   Scheduled Meds: . amiodarone  200 mg Oral BID  . aspirin EC  81 mg Oral Daily  . carvedilol  3.125 mg Oral BID  . gabapentin  300 mg Oral TID  . heparin  5,000 Units Subcutaneous Q8H  . insulin aspart  0-5 Units Subcutaneous QHS  . insulin aspart  0-9 Units Subcutaneous TID WC  . insulin glargine  20 Units Subcutaneous q morning - 10a  . multivitamin-lutein  1 capsule Oral Daily  . mupirocin ointment   Nasal BID  . predniSONE  5 mg Oral Q breakfast  . Ensure Max Protein  11 oz Oral TID BM  . torsemide  40 mg Oral q morning - 10a   And  . torsemide  20 mg Oral QPM   Continuous Infusions: . sodium chloride Stopped (09/06/18 1459)  . cefTRIAXone (ROCEPHIN)  IV 2 g (09/06/18 2239)  . vancomycin 750 mg (09/07/18 0348)   PRN Meds:.sodium chloride, acetaminophen **OR** acetaminophen, ondansetron **OR** ondansetron (ZOFRAN) IV, oxyCODONE   Data Review:   Micro Results Recent Results (from the past 240 hour(s))  SARS Coronavirus 2 (CEPHEID - Performed in [place] hospital lab), Hosp Order     Status: None   Collection Time: 09/05/18 11:49 PM  Result Value Ref Range Status   SARS Coronavirus 2 NEGATIVE NEGATIVE Final    Comment: (NOTE) If result is NEGATIVE SARS-CoV-2 target nucleic acids are NOT DETECTED. The SARS-CoV-2 RNA is generally detectable in upper and lower  respiratory specimens during the acute phase of infection. The lowest  concentration of SARS-CoV-2 viral copies this assay can detect is 250  copies / mL. A negative result does not preclude SARS-CoV-2 infection  and should  not be used as the sole basis for treatment or other  patient management decisions.  A negative  result may occur with  improper specimen collection / handling, submission of specimen other  than nasopharyngeal swab, presence of viral mutation(s) within the  areas targeted by this assay, and inadequate number of viral copies  (<250 copies / mL). A negative result must be combined with clinical  observations, patient history, and epidemiological information. If result is POSITIVE SARS-CoV-2 target nucleic acids are DETECTED. The SARS-CoV-2 RNA is generally detectable in upper and lower  respiratory specimens dur ing the acute phase of infection.  Positive  results are indicative of active infection with SARS-CoV-2.  Clinical  correlation with patient history and other diagnostic information is  necessary to determine patient infection status.  Positive results do  not rule out bacterial infection or co-infection with other viruses. If result is PRESUMPTIVE POSTIVE SARS-CoV-2 nucleic acids MAY BE PRESENT.   A presumptive positive result was obtained on the submitted specimen  and confirmed on repeat testing.  While 2019 novel coronavirus  (SARS-CoV-2) nucleic acids may be present in the submitted sample  additional confirmatory testing may be necessary for epidemiological  and / or clinical management purposes  to differentiate between  SARS-CoV-2 and other Sarbecovirus currently known to infect humans.  If clinically indicated additional testing with an alternate test  methodology [phone]) is advised. The SARS-CoV-2 RNA is generally  detectable in upper and lower respiratory sp ecimens during the acute  phase of infection. The expected result is Negative. Fact Sheet for Patients:  StrictlyIdeas.no Fact Sheet for Healthcare Providers: BankingDealers.co.za This test is not yet approved or cleared by the [place] FDA and has been authorized for detection and/or diagnosis of SARS-CoV-2 by FDA under an Emergency Use Authorization  (EUA).  This EUA will remain in effect (meaning this test can be used) for the duration of the COVID-19 declaration under Section 564(b)(1) of the Act, 21 U.S.C. section 360bbb-3(b)(1), unless the authorization is terminated or revoked sooner. Performed at [facility], [address] [place], [place] [address]   MRSA PCR Screening     Status: Abnormal   Collection Time: 09/06/18  4:35 AM  Result Value Ref Range Status   MRSA by PCR POSITIVE (A) NEGATIVE Final    Comment:        The GeneXpert MRSA Assay (FDA approved for NASAL specimens only), is one component of a comprehensive MRSA colonization surveillance program. It is not intended to diagnose MRSA infection nor to guide or monitor treatment for MRSA infections. RESULT CALLED TO, READ BACK BY AND VERIFIED WITH: JENNIFER COLE @0812  ON 09/06/2018 BY FMW Performed at [facility], [address] [place], [place] [address]     Radiology Reports US Carotid Bilateral  Result Date: 09/07/2018 CLINICAL DATA:  Hypertension, previous stroke, coronary artery disease, left visual disturbance, diabetes, history of left carotid stent/endarterectomy. EXAM: BILATERAL CAROTID DUPLEX ULTRASOUND TECHNIQUE: Pearline Cables scale imaging, color Doppler and duplex ultrasound were performed of bilateral carotid and vertebral arteries in the neck. COMPARISON:  None. FINDINGS: Criteria: Quantification of carotid stenosis is based on velocity parameters that correlate the residual internal carotid diameter with NASCET-based stenosis levels, using the diameter of the distal internal carotid lumen as the denominator for stenosis measurement. The following velocity measurements were obtained: RIGHT ICA: 132/55 cm/sec CCA: 13/08 cm/sec SYSTOLIC ICA/CCA RATIO:  1.4 ECA: 264 cm/sec LEFT ICA: 117/48 cm/sec CCA: 65/78 cm/sec SYSTOLIC ICA/CCA RATIO:  1.5 ECA:  153 cm/sec RIGHT CAROTID ARTERY: Mild partially calcified plaque in the mid and distal common  carotid artery, bulb, proximal ECA and ICA. There is at least mild stenosis in the proximal ECA. No high-grade ICA stenosis. Normal waveforms and color Doppler signal. RIGHT VERTEBRAL ARTERY:  Normal flow direction and waveform. LEFT CAROTID ARTERY: Patent stent from the distal common carotid artery across the bulb into the proximal ICA. No high-grade stenosis. Normal waveforms and color Doppler signal. Atheromatous plaque in mid and distal ICA. Continued antegrade flow into the external carotid artery. LEFT VERTEBRAL ARTERY:  Normal flow direction and waveform. IMPRESSION: 1. Right carotid bifurcation plaque resulting in less than 50% diameter ICA stenosis. 2. Patent left carotid stent without evidence of high-grade residual/recurrent stenosis. 3.  Antegrade bilateral vertebral arterial flow. Electronically Signed   By: Lucrezia Europe M.D.   On: 09/07/2018 08:42   Dg Foot 2 Views Right  Result Date: 09/05/2018 CLINICAL DATA:  53 y/o M; wound evaluation. 07/28/2018 calcaneal osteotomy and debridement. EXAM: RIGHT FOOT - 2 VIEW COMPARISON:  07/25/2018 right ankle radiographs. 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 Large dorsal calcaneal soft tissue post debridement, increased in size of soft tissue defect from prior CT of the ankle. Additionally, there is increased cortical lucency along the subjacent calcaneus. Vascular calcifications noted. IMPRESSION: Dorsal calcaneus soft tissue ulceration and subjacent bony cortical lucencies are increased in comparison with the prior CT of ankle which may represent progressive infection and/or interval postsurgical changes. Electronically Signed   By: Kristine Garbe M.D.   On: 09/05/2018 22:27     CBC Recent Labs  Lab 09/05/18 1754 09/06/18 0411 09/07/18 0435  WBC 12.8* 15.7* 12.1*  HGB 7.7* 8.7* 7.4*  HCT 26.0* 29.2* 25.3*  PLT 327 390 329  MCV 86.4 86.1 86.6  MCH 25.6* 25.7* 25.3*  MCHC  29.6* 29.8* 29.2*  RDW 17.5* 17.6* 17.7*  LYMPHSABS 0.9  --   --   MONOABS 0.7  --   --   EOSABS 0.0  --   --   BASOSABS 0.0  --   --     Chemistries  Recent Labs  Lab 09/05/18 1754 09/06/18 0411 09/07/18 0435  NA 136 138 140  K 3.7 3.6 4.1  CL 103 105 106  CO2 20* 25 25  GLUCOSE 280* 175* 109*  BUN 60* 56* 53*  CREATININE 2.85* 2.59* 2.75*  CALCIUM 8.9 8.9 8.6*  AST 15  --   --   ALT 9  --   --   ALKPHOS 99  --   --   BILITOT 0.5  --   --    ------------------------------------------------------------------------------------------------------------------ estimated creatinine clearance is 35.8 mL/min (A) (by C-G formula based on SCr of 2.75 mg/dL (H)). ------------------------------------------------------------------------------------------------------------------ No results for input(s): HGBA1C in the last 72 hours. ------------------------------------------------------------------------------------------------------------------ No results for input(s): CHOL, HDL, LDLCALC, TRIG, CHOLHDL, LDLDIRECT in the last 72 hours. ------------------------------------------------------------------------------------------------------------------ No results for input(s): TSH, T4TOTAL, T3FREE, THYROIDAB in the last 72 hours.  Invalid input(s): FREET3 ------------------------------------------------------------------------------------------------------------------ No results for input(s): VITAMINB12, FOLATE, FERRITIN, TIBC, IRON, RETICCTPCT in the last 72 hours.  Coagulation profile No results for input(s): INR, PROTIME in the last 168 hours.  No results for input(s): DDIMER in the last 72 hours.  Cardiac Enzymes No results for input(s): CKMB, TROPONINI, MYOGLOBIN in the last 168 hours.  Invalid input(s): CK ------------------------------------------------------------------------------------------------------------------ Invalid input(s): [place]  Patient  is 53 year old with recurrent foot infection  1.  Acute osteomyelitis of right foot: Continue IV antibiotics follow cultures, appreciate ID input, vascular.  And podiatry patient will have angiogram on Friday 2.   HTN (hypertension) -home dose antihypertensives 3. Anemia check anemia panel, like acd, follow CBC transfuse as needed 4.   Diabetes ([place]) -sliding scale insulin 5.   CAD (coronary artery disease) -continue home meds 6.  Chronic systolic CHF (congestive heart failure) ([place]) -continue home meds 7. CKD (chronic kidney disease), stage IV ([place]) -at baseline, avoid nephrotoxins and monitor      Code Status Orders  (From admission, onward)         Start     Ordered   09/06/18 0259  Full code  Continuous     09/06/18 0258        Code Status History    Date Active Date Inactive Code Status Order ID Comments User Context   07/25/2018 1717 08/02/2018 1919 Full Code [id number]  Dustin Flock, MD Inpatient   05/25/2018 0352 05/29/2018 2022 Full Code [id number]  Hillary Bow, MD ED   10/09/2016 1003 10/09/2016 1340 Full Code [id number]  Samara Deist, [place] Inpatient   08/22/2016 0042 08/27/2016 1649 Full Code [id number]  Lance Coon, MD Inpatient   07/18/2014 1818 07/20/2014 2002 Full Code [id number]  Leighton Parody, PA-C Inpatient           Consults podiatry  DVT Prophylaxis  Lovenox    Lab Results  Component Value Date   PLT 329 09/07/2018     Time Spent in minutes 35 minutes greater than 50% of time spent in care coordination and counseling patient regarding the condition and plan of care.   Dustin Flock M.D on 09/07/2018 at 11:03 AM  Between 7am to 6pm - Pager - [phone]  After 6pm go to [email or web address] - Proofreader  [employer]   Office  [phone]

## 2018-09-08 ENCOUNTER — Inpatient Hospital Stay: Payer: Medicare Other | Admitting: Anesthesiology

## 2018-09-08 ENCOUNTER — Other Ambulatory Visit (INDEPENDENT_AMBULATORY_CARE_PROVIDER_SITE_OTHER): Payer: Self-pay | Admitting: Vascular Surgery

## 2018-09-08 ENCOUNTER — Encounter: Payer: Self-pay | Admitting: Podiatry

## 2018-09-08 ENCOUNTER — Encounter
Admission: EM | Disposition: A | Discharge status: Home Health | Payer: Self-pay | Source: Home / Self Care | Attending: Internal Medicine

## 2018-09-08 HISTORY — PX: INCISION AND DRAINAGE: SHX5863

## 2018-09-08 LAB — CBC
HCT: 23.4 % — ABNORMAL LOW (ref 39.0–52.0)
Hemoglobin: 6.9 g/dL — ABNORMAL LOW (ref 13.0–17.0)
MCH: 25.3 pg — ABNORMAL LOW (ref 26.0–34.0)
MCHC: 29.5 g/dL — ABNORMAL LOW (ref 30.0–36.0)
MCV: 85.7 fL (ref 80.0–100.0)
Platelets: 302 10*3/uL (ref 150–400)
RBC: 2.73 MIL/uL — ABNORMAL LOW (ref 4.22–5.81)
RDW: 17.8 % — ABNORMAL HIGH (ref 11.5–15.5)
WBC: 12.5 10*3/uL — ABNORMAL HIGH (ref 4.0–10.5)
nRBC: 0 % (ref 0.0–0.2)

## 2018-09-08 LAB — PREPARE RBC (CROSSMATCH)

## 2018-09-08 LAB — GLUCOSE, CAPILLARY
Glucose-Capillary: 105 mg/dL — ABNORMAL HIGH (ref 70–99)
Glucose-Capillary: 89 mg/dL (ref 70–99)
Glucose-Capillary: 88 mg/dL (ref 70–99)
Glucose-Capillary: 304 mg/dL — ABNORMAL HIGH (ref 70–99)

## 2018-09-08 LAB — BASIC METABOLIC PANEL
Anion gap: 11 (ref 5–15)
BUN: 61 mg/dL — ABNORMAL HIGH (ref 6–20)
CO2: 24 mmol/L (ref 22–32)
Calcium: 8.3 mg/dL — ABNORMAL LOW (ref 8.9–10.3)
Chloride: 101 mmol/L (ref 98–111)
Creatinine, Ser: 2.78 mg/dL — ABNORMAL HIGH (ref 0.61–1.24)
GFR calc Af Amer: 29 mL/min — ABNORMAL LOW (ref >60)
GFR calc non Af Amer: 25 mL/min — ABNORMAL LOW (ref >60)
Glucose, Bld: 130 mg/dL — ABNORMAL HIGH (ref 70–99)
Potassium: 3.9 mmol/L (ref 3.5–5.1)
Sodium: 136 mmol/L (ref 135–145)

## 2018-09-08 SURGERY — INCISION AND DRAINAGE
Anesthesia: General | Laterality: Right

## 2018-09-08 MED ORDER — VANCOMYCIN HCL 1000 MG IV SOLR
INTRAVENOUS | Status: DC | PRN
  Administered 2018-09-08: 1000 mg via TOPICAL

## 2018-09-08 MED ORDER — BUPIVACAINE HCL (PF) 0.5 % IJ SOLN
INTRAMUSCULAR | Status: DC | PRN
  Administered 2018-09-08: 16 mL

## 2018-09-08 MED ORDER — OXYCODONE HCL 5 MG PO TABS: 5.0000 mg | ORAL_TABLET | Freq: Once | ORAL | Status: DC | PRN

## 2018-09-08 MED ORDER — PROPOFOL 10 MG/ML IV BOLUS
INTRAVENOUS | Status: AC
  Filled 2018-09-08: qty 20

## 2018-09-08 MED ORDER — SODIUM CHLORIDE 0.9% IV SOLUTION
Freq: Once | INTRAVENOUS | Status: AC
  Administered 2018-09-08: 11:00:00 via INTRAVENOUS

## 2018-09-08 MED ORDER — FENTANYL CITRATE (PF) 100 MCG/2ML IJ SOLN
INTRAMUSCULAR | Status: AC
  Filled 2018-09-08: qty 2

## 2018-09-08 MED ORDER — FENTANYL CITRATE (PF) 100 MCG/2ML IJ SOLN: 25.0000 ug | INTRAMUSCULAR | Status: DC | PRN

## 2018-09-08 MED ORDER — PROPOFOL 10 MG/ML IV BOLUS
INTRAVENOUS | Status: DC | PRN
  Administered 2018-09-08: 100 mg via INTRAVENOUS

## 2018-09-08 MED ORDER — GENTAMICIN SULFATE 40 MG/ML IJ SOLN
INTRAMUSCULAR | Status: DC | PRN
  Administered 2018-09-08: 240 mg

## 2018-09-08 MED ORDER — PHENYLEPHRINE HCL (PRESSORS) 10 MG/ML IV SOLN
INTRAVENOUS | Status: DC | PRN
  Administered 2018-09-08: 100 ug via INTRAVENOUS

## 2018-09-08 MED ORDER — SUCCINYLCHOLINE CHLORIDE 20 MG/ML IJ SOLN
INTRAMUSCULAR | Status: DC | PRN
  Administered 2018-09-08: 100 mg via INTRAVENOUS

## 2018-09-08 MED ORDER — FENTANYL CITRATE (PF) 100 MCG/2ML IJ SOLN
INTRAMUSCULAR | Status: DC | PRN
  Administered 2018-09-08: 50 ug via INTRAVENOUS

## 2018-09-08 MED ORDER — DEXAMETHASONE SODIUM PHOSPHATE 10 MG/ML IJ SOLN
INTRAMUSCULAR | Status: DC | PRN
  Administered 2018-09-08: 5 mg via INTRAVENOUS

## 2018-09-08 MED ORDER — SODIUM CHLORIDE 0.9 % IV SOLN
INTRAVENOUS | Status: DC | PRN
  Administered 2018-09-08: 30 ug/min via INTRAVENOUS

## 2018-09-08 MED ORDER — SODIUM CHLORIDE 0.9 % IV SOLN
INTRAVENOUS | Status: AC
  Administered 2018-09-09 – 2018-09-10 (×3): via INTRAVENOUS

## 2018-09-08 MED ORDER — OXYCODONE HCL 5 MG/5ML PO SOLN: 5.0000 mg | Freq: Once | ORAL | Status: DC | PRN

## 2018-09-08 MED ORDER — CEFAZOLIN SODIUM-DEXTROSE 1-4 GM/50ML-% IV SOLN
1.0000 g | Freq: Once | INTRAVENOUS | Status: AC
  Administered 2018-09-08: 1 g via INTRAVENOUS
  Filled 2018-09-08: qty 50

## 2018-09-08 MED ORDER — ROCURONIUM BROMIDE 100 MG/10ML IV SOLN
INTRAVENOUS | Status: DC | PRN
  Administered 2018-09-08: 30 mg via INTRAVENOUS
  Administered 2018-09-08: 10 mg via INTRAVENOUS

## 2018-09-08 SURGICAL SUPPLY — 53 items
BAG COUNTER SPONGE EZ (MISCELLANEOUS) ×1 IMPLANT
BANDAGE ELASTIC 3 LF NS (GAUZE/BANDAGES/DRESSINGS) ×1 IMPLANT
BANDAGE ELASTIC 4 LF NS (GAUZE/BANDAGES/DRESSINGS) ×1 IMPLANT
BLADE OSCILLATING/SAGITTAL (BLADE) ×2
BLADE SW THK.38XMED LNG THN (BLADE) IMPLANT
BNDG CONFORM 3 STRL LF (GAUZE/BANDAGES/DRESSINGS) ×5 IMPLANT
BNDG ESMARK 4X12 TAN STRL LF (GAUZE/BANDAGES/DRESSINGS) ×3 IMPLANT
BNDG GAUZE 4.5X4.1 6PLY STRL (MISCELLANEOUS) ×3 IMPLANT
CANISTER SUCT 1200ML W/VALVE (MISCELLANEOUS) ×3 IMPLANT
CNTNR SPEC 2.5X3XGRAD LEK (MISCELLANEOUS) ×2
CONT SPEC 4OZ STER OR WHT (MISCELLANEOUS) ×4
CONTAINER SPEC 2.5X3XGRAD LEK (MISCELLANEOUS) IMPLANT
COUNTER SPONGE BAG EZ (MISCELLANEOUS)
COVER WAND RF STERILE (DRAPES) ×3 IMPLANT
CUFF TOURN SGL QUICK 12 (TOURNIQUET CUFF) IMPLANT
CUFF TOURN SGL QUICK 18X4 (TOURNIQUET CUFF) IMPLANT
DRAPE FLUOR MINI C-ARM 54X84 (DRAPES) ×3 IMPLANT
DURAPREP 26ML APPLICATOR (WOUND CARE) ×1 IMPLANT
ELECT REM PT RETURN 9FT ADLT (ELECTROSURGICAL) ×3
ELECTRODE REM PT RTRN 9FT ADLT (ELECTROSURGICAL) ×1 IMPLANT
GAUZE 4X4 16PLY RFD (DISPOSABLE) ×2 IMPLANT
GAUZE SPONGE 4X4 12PLY STRL (GAUZE/BANDAGES/DRESSINGS) ×5 IMPLANT
GAUZE XEROFORM 1X8 LF (GAUZE/BANDAGES/DRESSINGS) ×3 IMPLANT
GLOVE BIO SURGEON STRL SZ8 (GLOVE) ×3 IMPLANT
GLOVE INDICATOR 8.0 STRL GRN (GLOVE) ×3 IMPLANT
GOWN STRL REUS W/ TWL LRG LVL3 (GOWN DISPOSABLE) ×1 IMPLANT
GOWN STRL REUS W/TWL LRG LVL3 (GOWN DISPOSABLE) ×2
HANDPIECE VERSAJET DEBRIDEMENT (MISCELLANEOUS) ×2 IMPLANT
KIT STIMULAN RAPID CURE 5CC (Orthopedic Implant) ×2 IMPLANT
KIT TURNOVER KIT A (KITS) ×3 IMPLANT
LABEL OR SOLS (LABEL) ×1 IMPLANT
NDL FILTER BLUNT 18X1 1/2 (NEEDLE) ×1 IMPLANT
NDL HYPO 25X1 1.5 SAFETY (NEEDLE) ×2 IMPLANT
NEEDLE FILTER BLUNT 18X 1/2SAF (NEEDLE) ×2
NEEDLE FILTER BLUNT 18X1 1/2 (NEEDLE) ×1 IMPLANT
NEEDLE HYPO 25X1 1.5 SAFETY (NEEDLE) ×6 IMPLANT
NS IRRIG 500ML POUR BTL (IV SOLUTION) ×3 IMPLANT
PACK EXTREMITY ARMC (MISCELLANEOUS) ×3 IMPLANT
PAD ABD DERMACEA PRESS 5X9 (GAUZE/BANDAGES/DRESSINGS) ×6 IMPLANT
RASP SM TEAR CROSS CUT (RASP) ×2 IMPLANT
STOCKINETTE STRL 6IN 960660 (GAUZE/BANDAGES/DRESSINGS) ×3 IMPLANT
SUT ETHILON 3-0 FS-10 30 BLK (SUTURE) ×9
SUT ETHILON 4-0 (SUTURE)
SUT ETHILON 4-0 FS2 18XMFL BLK (SUTURE)
SUT VIC AB 2-0 SH 27 (SUTURE) ×2
SUT VIC AB 2-0 SH 27XBRD (SUTURE) IMPLANT
SUT VIC AB 3-0 SH 27 (SUTURE)
SUT VIC AB 3-0 SH 27X BRD (SUTURE) ×1 IMPLANT
SUT VIC AB 4-0 FS2 27 (SUTURE) ×1 IMPLANT
SUTURE EHLN 3-0 FS-10 30 BLK (SUTURE) ×1 IMPLANT
SUTURE ETHLN 4-0 FS2 18XMF BLK (SUTURE) ×1 IMPLANT
SYR 10ML LL (SYRINGE) ×3 IMPLANT
SYR 3ML LL SCALE MARK (SYRINGE) ×6 IMPLANT

## 2018-09-08 NOTE — Anesthesia Post-op Follow-up Note (Signed)
Anesthesia QCDR form completed.        

## 2018-09-08 NOTE — Op Note (Signed)
Operative note   Surgeon: Dr. Albertine Patricia, DPM.    Assistant: None    Preop diagnosis: Infected tendon sheath right Achilles tendon with likely osteomyelitis right heel    Postop diagnosis: Same    Procedure:   1.  Excision of infected necrotic Achilles tendon right   2.  Excision of posterior calcaneus right        EBL: 20 cc    Anesthesia:general delivered by the anesthesia team I injected 16 cc of Sensorcaine plain postoperatively around the operative site    Hemostasis: Mid calf tourniquet for 40 minutes at 2 and 50 mils mercury pressure    Specimen: 1.  Necrotic tendon Achilles right 2.  Posterior calcaneus right    Complications: None    Operative indications: Patient's had a chronic large decubitus ulceration on the back of the right heel for about 6 months.  Initially it was a gangrenous necrotic plaque debridement yielded exposed bone at the time.  Attempts to heal were unsuccessful wound VAC was utilized and further debridement was utilized and a continued wound VAC was still unsuccessful in getting it healed and the patient is progressively in spite of being on long-term IV antibiotics developed infection in the tendon sheath of the Achilles tendon with likely involvement of the posterior calcaneus    Procedure:  Patient was brought into the OR and placed on the operating table in theprone position. After anesthesia was obtained theright lower extremity was prepped and draped in usual sterile fashion.  Operative Report: At this time attention was directed to the posterior right heel where an 8 cm linear incision was made on the posterior Achilles tendon starting superior to the calcaneus extending distally.  Incision was carried down to the tendon sheath and tendon where significant purulent drainage was noted also along with obvious necrosis deterioration and infection of the Achilles tendon.  This was tracked approximately 7 cm proximally and resected the point where  the tendon became more normal.  The tendon sheath was observed and appeared to be fairly clean did probe into the area deep to the tendon sheath and compression did not yield any purulence on a segment of the area.  This point is directed to the posterior calcaneus which is been exposed for the last 6 months.  Some granulation tissue was medial and lateral but there was a lot of necrotic tissue scattered around the calcaneus a lot of this was necrotic residual Achilles tendon insertional areas and were completely nonfunctional at this point.  The bone had some softness at the posterior aspect which is consistent with osteomyelitis.  A deep tissue culture was sent for culture and sensitivities.  The soft tissues freed away from the posterior calcaneus and a combination of a sagittal saw and a rondure as well as an osteotome was utilized to resect the posterior calcaneus.  This had the intention of not only removing additional infected bone but also creating an area with enough space that I could cover up the area of the bone with soft tissue and hopefully promote more healing to the region without further infection and damage.  Rough edges were rasped smoothly and both the Achilles tendon sheath as well as the heel area were flushed copiously with 2 and 50 cc of normal saline.  Vancomycin gentamicin beads were then placed all throughout the deep incision margin up against the heel bone as well as in the tendon sheath itself at this point the distal ulceration was closed in a  horizontal fashion primarily with some verticality to it but I was able to close the wound approximately 95% with suturing.  The deep tissue over the Achilles area was closed with 3-0 Vicryl in continuous stitch but at the distal wound and the skin at over the Achilles were closed with 3-0 nylon simple interrupted horizontal mattress combinations.  A sterile dressing was placed across wound consisting of Xeroform gauze 4 x 4's ABD pads  conformer and Kerlix.  Patient tolerated procedure and anesthesia well.    Patient tolerated the procedure and anesthesia well.  Was transported from the OR to the PACU with all vital signs stable and vascular status intact. To be discharged per routine protocol.  Will follow up in approximately 1 week in the outpatient clinic.

## 2018-09-08 NOTE — Anesthesia Procedure Notes (Signed)
Procedure Name: Intubation Date/Time: 09/08/2018 2:28 PM Performed by: Nelda Marseille, CRNA Pre-anesthesia Checklist: Patient identified, Patient being monitored, Timeout performed, Emergency Drugs available and Suction available Patient Re-evaluated:Patient Re-evaluated prior to induction Oxygen Delivery Method: Circle system utilized Preoxygenation: Pre-oxygenation with 100% oxygen Induction Type: IV induction Ventilation: Mask ventilation without difficulty Laryngoscope Size: Mac, 3 and McGraph Grade View: Grade I Tube type: Oral Tube size: 7.0 mm Number of attempts: 1 Airway Equipment and Method: Stylet Placement Confirmation: ETT inserted through vocal cords under direct vision,  positive ETCO2 and breath sounds checked- equal and bilateral Secured at: 21 cm Tube secured with: Tape Dental Injury: Teeth and Oropharynx as per pre-operative assessment

## 2018-09-08 NOTE — Progress Notes (Signed)
Assessed patient for placing PIV. Patient has had multiple IV sticks with restarts since admission. DC date unknown. Patient receiving Vancomycin infusions. Judi Saa, RN to contact MD to request PICC line assessment/placement. VU. Fran Lowes, RN VAST

## 2018-09-08 NOTE — Anesthesia Preprocedure Evaluation (Addendum)
Anesthesia Evaluation  Patient identified by MRN, date of birth, ID band Patient awake    Reviewed: Allergy & Precautions, H&P , NPO status , Patient's Chart, lab work & pertinent test results  History of Anesthesia Complications Negative for: history of anesthetic complications  Airway Mallampati: II  TM Distance: >3 FB Neck ROM: Full    Dental  (+) Upper Dentures, Lower Dentures   Pulmonary neg sleep apnea, neg COPD,    breath sounds clear to auscultation- rhonchi (-) wheezing      Cardiovascular hypertension, Pt. on medications + CAD, + Past MI, + CABG (2010), + Peripheral Vascular Disease and +CHF (ICD)  (-) Cardiac Stents + Cardiac Defibrillator  Rhythm:Regular Rate:Normal - Systolic murmurs and - Diastolic murmurs    Neuro/Psych neg Seizures CVA, No Residual Symptoms negative psych ROS   GI/Hepatic negative GI ROS, Neg liver ROS,   Endo/Other  diabetes (diet controlled), Poorly Controlled  Renal/GU CRFRenal disease     Musculoskeletal  (+) Arthritis ,   Abdominal (+) + obese,   Peds  Hematology  (+) Blood dyscrasia, anemia ,   Anesthesia Other Findings Past Medical History: No date: Anemia No date: Cardiac defibrillator in place     Comment:  a. Biotronik LUmax 540 DRT, (ser # [id number]). No date: Carotid arterial disease ([place])     Comment:  a. s/p prior LICA stenting;  b. 08/4965 Carotid U/S:               40-59% bilat ICA stenosis. Patent LICA stent. No date: CHF (congestive heart failure) (HCC) No date: CKD (chronic kidney disease), stage III ([place]) No date: Coronary artery disease     Comment:  a. 2010 s/p CABG x 3. No date: DDD (degenerative disc disease), lumbosacral     Comment:  L5-S1 No date: Diabetes [facility])     Comment:  Lantus at bedtime No date: Gangrene of toe of right foot ([place]) 10/06/2016: Gout of left hand No date: HFrEF (heart failure with reduced ejection fraction) ([place])     Comment:   a. 07/2016 Echo: EF 25-30%, diff HK, mild MR, mildly dil               LA, mod reduced RV fxn, PASP 51mmHg. No date: Hypertension     Comment:  takes Coreg daily No date: Ischemic cardiomyopathy     Comment:  a. 07/2016 Echo: EF 25-30%, diff HK. 2009: Myocardial infarction ([place]) No date: Peripheral vascular disease ([place]) No date: Sleep apnea     Comment:  sleep study yr ago-unable to afford cpap 09: Stroke [facility])     Comment:  no weakness  Past Surgical History: 08/22/2016: AMPUTATION TOE; Right     Comment:  Procedure: AMPUTATION TOE;  Surgeon: Samara Deist, DPM;              Location: [facility];  Service: Podiatry;  Laterality:               Right; 10/09/2016: AMPUTATION TOE; Right     Comment:  Procedure: AMPUTATION TOE-RIGHT 2ND MPJ;  Surgeon:               Samara Deist, DPM;  Location: [facility];  Service:               Podiatry;  Laterality: Right; 10/09/2016: APLIGRAFT PLACEMENT; Right     Comment:  Procedure: APLIGRAFT PLACEMENT;  Surgeon: Samara Deist,  DPM;  Location: [facility];  Service: Podiatry;                Laterality: Right; 07/28/2018: CALCANEAL OSTEOTOMY; Bilateral     Comment:  Procedure: RIGHT CALCANECTOMY BILATERAL DEBRIDEMENT OF               ULCERS ON HEELS;  Surgeon: Albertine Patricia, DPM;                Location: [facility];  Service: Podiatry;  Laterality:               Bilateral; 2011: CARDIAC DEFIBRILLATOR PLACEMENT No date: CAROTID ENDARTERECTOMY; Left 2010: CHOLECYSTECTOMY 2010: CORONARY ARTERY BYPASS GRAFT     Comment:  CABG x 3  6/83/4196: GRAFT APPLICATION; Right     Comment:  Procedure: FULL THICKNESS SKIN GRAFT-RIGHT FOOT;                Surgeon: Algernon Huxley, MD;  Location: [facility];  Service:              Vascular;  Laterality: Right; 1994: HIP SURGERY; Right 08/22/2016: INCISION AND DRAINAGE OF WOUND; Right     Comment:  Procedure: IRRIGATION AND DEBRIDEMENT WOUND and wound               vac placement;  Surgeon: Samara Deist, DPM;   Location:               [facility];  Service: Podiatry;  Laterality: Right; 08/24/2016: LOWER EXTREMITY ANGIOGRAPHY; Right     Comment:  Procedure: Lower Extremity Angiography;  Surgeon: Algernon Huxley, MD;  Location: [place] [facility];  Service:               Cardiovascular;  Laterality: Right; 07/27/2018: LOWER EXTREMITY ANGIOGRAPHY; Right     Comment:  Procedure: RIGHT Lower Extremity Angiography;  Surgeon:               Algernon Huxley, MD;  Location: [place] [facility];                Service: Cardiovascular;  Laterality: Right; 07/18/2014: MASS EXCISION; Right     Comment:  Procedure: EXCISION HETEROTOPIC BONE RIGHT HIP;                Surgeon: Frederik Pear, MD;  Location: [place];  Service:               Orthopedics;  Laterality: Right; 2010: Open Heart Surgery     Comment:  x 3 10/09/2016: WOUND DEBRIDEMENT; Right     Comment:  Procedure: DEBRIDEMENT WOUND;  Surgeon: Samara Deist,               DPM;  Location: [facility];  Service: Podiatry;                Laterality: Right;  BMI    Body Mass Index:  31.74 kg/m      Reproductive/Obstetrics negative OB ROS                            Anesthesia Physical Anesthesia Plan  ASA: IV  Anesthesia Plan: General ETT   Post-op Pain Management:    Induction: Intravenous  PONV Risk Score and Plan: 1 and Dexamethasone, Ondansetron, Midazolam and Treatment may vary due to age or medical condition  Airway Management Planned:  LMA  Additional Equipment:   Intra-op Plan:   Post-operative Plan:   Informed Consent: I have reviewed the patients History and Physical, chart, labs and discussed the procedure including the risks, benefits and alternatives for the proposed anesthesia with the patient or authorized representative who has indicated his/her understanding and acceptance.     Dental Advisory Given  Plan Discussed with: Anesthesiologist and CRNA  Anesthesia Plan Comments:        Anesthesia Quick Evaluation

## 2018-09-08 NOTE — Transfer of Care (Signed)
Immediate Anesthesia Transfer of Care Note  Patient: Paul [name].  Procedure(s) Performed: INCISION AND DRAINAGE - DEPRIVEMENT OF DEFECTIVE SKIN, SOFT TISSUE AND BONE (Right )  Patient Location: PACU  Anesthesia Type:General  Level of Consciousness: sedated  Airway & Oxygen Therapy: Patient Spontanous Breathing and Patient connected to face mask oxygen  Post-op Assessment: Report given to RN and Post -op Vital signs reviewed and stable  Post vital signs: Reviewed and stable  Last Vitals:  Vitals Value Taken Time  BP 100/67 09/08/2018  3:34 PM  Temp    Pulse 68 09/08/2018  3:34 PM  Resp 14 09/08/2018  3:34 PM  SpO2 100 % 09/08/2018  3:34 PM  Vitals shown include unvalidated device data.  Last Pain:  Vitals:   09/08/18 1534  TempSrc:   PainSc: 0-No pain      Patients Stated Pain Goal: 0 (67/01/10 0349)  Complications: No apparent anesthesia complications

## 2018-09-08 NOTE — H&P (Signed)
Patient's history and physical was reviewed by me today.Marland Kitchen  He has not started Eliquis yet but we can maybe start that postop after a GI consult.  Patient's hemoglobin has dropped over the last few days and is getting a unit of blood now prior to surgery on his posterior heel. CT scan showed a likely complete tear of his Achilles tendon with fluid anterior and proximal to the calcaneus in that region.

## 2018-09-08 NOTE — Progress Notes (Signed)
[place] [facility]                                                                                                                                                                                  Patient Demographics   Paul [name], is a 53 y.o. male, DOB - 09-26-1965, KYH:[id number]  Admit date - 09/05/2018   Admitting Physician Lance Coon, MD  Outpatient Primary MD for the patient is Tracie Harrier, MD   LOS - 2  Subjective: Patient's hemoglobin has dropped further.  He currently denies any chest pain or shortness of breath    Review of Systems:   CONSTITUTIONAL: No documented fever. No fatigue, weakness. No weight gain, no weight loss.  EYES: No blurry or double vision.  ENT: No tinnitus. No postnasal drip. No redness of the oropharynx.  RESPIRATORY: No cough, no wheeze, no hemoptysis. No dyspnea.  CARDIOVASCULAR: No chest pain. No orthopnea. No palpitations. No syncope.  GASTROINTESTINAL: No nausea, no vomiting or diarrhea. No abdominal pain. No melena or hematochezia.  GENITOURINARY: No dysuria or hematuria.  ENDOCRINE: No polyuria or nocturia. No heat or cold intolerance.  HEMATOLOGY: No anemia. No bruising. No bleeding.  INTEGUMENTARY: Positive drainage from the foot MUSCULOSKELETAL: No arthritis. No swelling. No gout.  Positive neck pain NEUROLOGIC: No numbness, tingling, or ataxia. No seizure-type activity.  PSYCHIATRIC: No anxiety. No insomnia. No ADD.    Vitals:   Vitals:   09/07/18 1421 09/07/18 2018 09/08/18 0543 09/08/18 0814  BP: 128/75 102/65 105/65 125/74  Pulse: 89 97 85 83  Resp: 17 18 18 18   Temp: 97.9 F (36.6 C) 98 F (36.7 C) 98.6 F (37 C) 98.4 F (36.9 C)  TempSrc: Oral     SpO2: 99% 99% 100% 98%  Weight:      Height:        Wt Readings from Last 3 Encounters:  09/06/18 97.5 kg  07/25/18 94.8 kg  06/08/18 100.8 kg     Intake/Output Summary (Last 24 hours) at 09/08/2018 1018 Last data filed at 09/08/2018  0814 Gross per 24 hour  Intake 216.21 ml  Output 2670 ml  Net -2453.79 ml    Physical Exam:   GENERAL: Pleasant-appearing in no apparent distress.  HEAD, EYES, EARS, NOSE AND THROAT: Atraumatic, normocephalic. Extraocular muscles are intact. Pupils equal and reactive to light. Sclerae anicteric. No conjunctival injection. No oro-pharyngeal erythema.  NECK: Supple. There is no jugular venous distention. No bruits, no lymphadenopathy, no thyromegaly.  HEART: Regular rate and rhythm,. No murmurs, no rubs, no clicks.  LUNGS: Clear to auscultation bilaterally. No rales or rhonchi. No wheezes.  ABDOMEN: Soft, flat, nontender, nondistended.  Has good bowel sounds. No hepatosplenomegaly appreciated.  EXTREMITIES: Dressing in place both lower extremity nEUROLOGIC: The patient is alert, awake, and oriented x3 with no focal motor or sensory deficits appreciated bilaterally.  SKIN: Both feet dressing in place.  Psych: Not anxious, depressed LN: No inguinal LN enlargement    Antibiotics   Anti-infectives (From admission, onward)   Start     Dose/Rate Route Frequency Ordered Stop   09/07/18 0243  vancomycin (VANCOCIN) IVPB 750 mg/150 ml premix     750 mg 150 mL/hr over 60 Minutes Intravenous Every 24 hours 09/07/18 0244     09/07/18 0130  vancomycin (VANCOCIN) IVPB 750 mg/150 ml premix  Status:  Discontinued     750 mg 150 mL/hr over 60 Minutes Intravenous Every 24 hours 09/06/18 0353 09/07/18 0244   09/06/18 2200  cefTRIAXone (ROCEPHIN) 2 g in sodium chloride 0.9 % 100 mL IVPB     2 g 200 mL/hr over 30 Minutes Intravenous Every 24 hours 09/06/18 2152     09/06/18 0400  piperacillin-tazobactam (ZOSYN) IVPB 3.375 g  Status:  Discontinued     3.375 g 12.5 mL/hr over 240 Minutes Intravenous Every 8 hours 09/06/18 0353 09/06/18 1907   09/05/18 2345  vancomycin (VANCOCIN) 2,000 mg in sodium chloride 0.9 % 500 mL IVPB     2,000 mg 250 mL/hr over 120 Minutes Intravenous  Once 09/05/18 2338 09/06/18  0329   09/05/18 2330  vancomycin (VANCOCIN) IVPB 1000 mg/200 mL premix  Status:  Discontinued     1,000 mg 200 mL/hr over 60 Minutes Intravenous  Once 09/05/18 2316 09/05/18 2338   09/05/18 2330  cefTRIAXone (ROCEPHIN) 2 g in sodium chloride 0.9 % 100 mL IVPB     2 g 200 mL/hr over 30 Minutes Intravenous  Once 09/05/18 2316 09/06/18 0129      Medications   Scheduled Meds: . sodium chloride   Intravenous Once  . amiodarone  200 mg Oral BID  . aspirin EC  81 mg Oral Daily  . carvedilol  3.125 mg Oral BID  . gabapentin  300 mg Oral TID  . heparin  5,000 Units Subcutaneous Q8H  . insulin aspart  0-5 Units Subcutaneous QHS  . insulin aspart  0-9 Units Subcutaneous TID WC  . insulin glargine  20 Units Subcutaneous q morning - 10a  . multivitamin-lutein  1 capsule Oral Daily  . mupirocin ointment   Nasal BID  . predniSONE  5 mg Oral Q breakfast  . Ensure Max Protein  11 oz Oral TID BM  . torsemide  40 mg Oral q morning - 10a   And  . torsemide  20 mg Oral QPM   Continuous Infusions: . sodium chloride Stopped (09/06/18 1459)  . [START ON 09/09/2018] sodium chloride    . cefTRIAXone (ROCEPHIN)  IV 2 g (09/07/18 2136)  . vancomycin 750 mg (09/08/18 0445)   PRN Meds:.sodium chloride, acetaminophen **OR** acetaminophen, ondansetron **OR** ondansetron (ZOFRAN) IV, oxyCODONE   Data Review:   Micro Results Recent Results (from the past 240 hour(s))  SARS Coronavirus 2 (CEPHEID - Performed in [facility] hospital lab), Hosp Order     Status: None   Collection Time: 09/05/18 11:49 PM  Result Value Ref Range Status   SARS Coronavirus 2 NEGATIVE NEGATIVE Final    Comment: (NOTE) If result is NEGATIVE SARS-CoV-2 target nucleic acids are NOT DETECTED. The SARS-CoV-2 RNA is generally detectable in upper and lower  respiratory specimens during the acute phase of infection.  The lowest  concentration of SARS-CoV-2 viral copies this assay can detect is 250  copies / mL. A negative result  does not preclude SARS-CoV-2 infection  and should not be used as the sole basis for treatment or other  patient management decisions.  A negative result may occur with  improper specimen collection / handling, submission of specimen other  than nasopharyngeal swab, presence of viral mutation(s) within the  areas targeted by this assay, and inadequate number of viral copies  (<250 copies / mL). A negative result must be combined with clinical  observations, patient history, and epidemiological information. If result is POSITIVE SARS-CoV-2 target nucleic acids are DETECTED. The SARS-CoV-2 RNA is generally detectable in upper and lower  respiratory specimens dur ing the acute phase of infection.  Positive  results are indicative of active infection with SARS-CoV-2.  Clinical  correlation with patient history and other diagnostic information is  necessary to determine patient infection status.  Positive results do  not rule out bacterial infection or co-infection with other viruses. If result is PRESUMPTIVE POSTIVE SARS-CoV-2 nucleic acids MAY BE PRESENT.   A presumptive positive result was obtained on the submitted specimen  and confirmed on repeat testing.  While 2019 novel coronavirus  (SARS-CoV-2) nucleic acids may be present in the submitted sample  additional confirmatory testing may be necessary for epidemiological  and / or clinical management purposes  to differentiate between  SARS-CoV-2 and other Sarbecovirus currently known to infect humans.  If clinically indicated additional testing with an alternate test  methodology [phone]) is advised. The SARS-CoV-2 RNA is generally  detectable in upper and lower respiratory sp ecimens during the acute  phase of infection. The expected result is Negative. Fact Sheet for Patients:  StrictlyIdeas.no Fact Sheet for Healthcare Providers: BankingDealers.co.za This test is not yet approved or  cleared by the [place] FDA and has been authorized for detection and/or diagnosis of SARS-CoV-2 by FDA under an Emergency Use Authorization (EUA).  This EUA will remain in effect (meaning this test can be used) for the duration of the COVID-19 declaration under Section 564(b)(1) of the Act, 21 U.S.C. section 360bbb-3(b)(1), unless the authorization is terminated or revoked sooner. Performed at [facility], [address] [place], [place] [address]   MRSA PCR Screening     Status: Abnormal   Collection Time: 09/06/18  4:35 AM  Result Value Ref Range Status   MRSA by PCR POSITIVE (A) NEGATIVE Final    Comment:        The GeneXpert MRSA Assay (FDA approved for NASAL specimens only), is one component of a comprehensive MRSA colonization surveillance program. It is not intended to diagnose MRSA infection nor to guide or monitor treatment for MRSA infections. RESULT CALLED TO, READ BACK BY AND VERIFIED WITH: JENNIFER COLE @0812  ON 09/06/2018 BY FMW Performed at [facility], [address] [place], [place] [address]     Radiology Reports Ct Ankle Right Wo Contrast  Result Date: 09/07/2018 CLINICAL DATA:  Chronic wound on the heel. EXAM: CT OF THE RIGHT ANKLE WITHOUT CONTRAST TECHNIQUE: Multidetector CT imaging of the right ankle was performed according to the standard protocol. Multiplanar CT image reconstructions were also generated. COMPARISON:  CT right foot 05/25/2018. Plain films right foot 09/05/2018. FINDINGS: Bones/Joint/Cartilage No bony destructive change or periosteal reaction is identified. No fracture or dislocation. Mid to distal transmetatarsal amputations again seen. Ligaments Suboptimally assessed by CT. Muscles and Tendons Since the prior CT, the patient has  developed fluid containing a small amount of gas within and anterior to the distal Achilles tendon. Discrete measurement is difficult but fluid measures approximately 2.3 cm transverse by  1.3 cm AP by approximately 7 cm craniocaudal. The Achilles tendon appears completely torn 2 cm above its insertion on the calcaneus. Soft tissues There is subcutaneous edema about the foot. Subcutaneous edema is seen about the ankle and foot. IMPRESSION: Large skin wound over the heel. Findings highly suspicious for septic Achilles tenosynovitis and likely disruption of the Achilles tendon are identified. If the patient is able, MRI with and without contrast would be better for evaluation of these findings. Negative for CT evidence of osteomyelitis. Status post mid to distal transmetatarsal amputations as soon on prior exams. Electronically Signed   By: Inge Rise M.D.   On: 09/07/2018 15:08   US Carotid Bilateral  Result Date: 09/07/2018 CLINICAL DATA:  Hypertension, previous stroke, coronary artery disease, left visual disturbance, diabetes, history of left carotid stent/endarterectomy. EXAM: BILATERAL CAROTID DUPLEX ULTRASOUND TECHNIQUE: Pearline Cables scale imaging, color Doppler and duplex ultrasound were performed of bilateral carotid and vertebral arteries in the neck. COMPARISON:  None. FINDINGS: Criteria: Quantification of carotid stenosis is based on velocity parameters that correlate the residual internal carotid diameter with NASCET-based stenosis levels, using the diameter of the distal internal carotid lumen as the denominator for stenosis measurement. The following velocity measurements were obtained: RIGHT ICA: 132/55 cm/sec CCA: 93/26 cm/sec SYSTOLIC ICA/CCA RATIO:  1.4 ECA: 264 cm/sec LEFT ICA: 117/48 cm/sec CCA: 71/24 cm/sec SYSTOLIC ICA/CCA RATIO:  1.5 ECA: 153 cm/sec RIGHT CAROTID ARTERY: Mild partially calcified plaque in the mid and distal common carotid artery, bulb, proximal ECA and ICA. There is at least mild stenosis in the proximal ECA. No high-grade ICA stenosis. Normal waveforms and color Doppler signal. RIGHT VERTEBRAL ARTERY:  Normal flow direction and waveform. LEFT CAROTID ARTERY:  Patent stent from the distal common carotid artery across the bulb into the proximal ICA. No high-grade stenosis. Normal waveforms and color Doppler signal. Atheromatous plaque in mid and distal ICA. Continued antegrade flow into the external carotid artery. LEFT VERTEBRAL ARTERY:  Normal flow direction and waveform. IMPRESSION: 1. Right carotid bifurcation plaque resulting in less than 50% diameter ICA stenosis. 2. Patent left carotid stent without evidence of high-grade residual/recurrent stenosis. 3.  Antegrade bilateral vertebral arterial flow. Electronically Signed   By: Lucrezia Europe M.D.   On: 09/07/2018 08:42   Dg Foot 2 Views Right  Result Date: 09/05/2018 CLINICAL DATA:  53 y/o M; wound evaluation. 07/28/2018 calcaneal osteotomy and debridement. EXAM: RIGHT FOOT - 2 VIEW COMPARISON:  07/25/2018 right ankle radiographs. 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 Large dorsal calcaneal soft tissue post debridement, increased in size of soft tissue defect from prior CT of the ankle. Additionally, there is increased cortical lucency along the subjacent calcaneus. Vascular calcifications noted. IMPRESSION: Dorsal calcaneus soft tissue ulceration and subjacent bony cortical lucencies are increased in comparison with the prior CT of ankle which may represent progressive infection and/or interval postsurgical changes. Electronically Signed   By: Kristine Garbe M.D.   On: 09/05/2018 22:27     CBC Recent Labs  Lab 09/05/18 1754 09/06/18 0411 09/07/18 0435 09/08/18 0325  WBC 12.8* 15.7* 12.1* 12.5*  HGB 7.7* 8.7* 7.4* 6.9*  HCT 26.0* 29.2* 25.3* 23.4*  PLT 327 390 329 302  MCV 86.4 86.1 86.6 85.7  MCH 25.6* 25.7* 25.3* 25.3*  MCHC 29.6* 29.8* 29.2* 29.5*  RDW 17.5* 17.6* 17.7* 17.8*  LYMPHSABS 0.9  --   --   --   MONOABS 0.7  --   --   --   EOSABS 0.0  --   --   --   BASOSABS 0.0  --   --   --     Chemistries  Recent Labs   Lab 09/05/18 1754 09/06/18 0411 09/07/18 0435 09/08/18 0325  NA 136 138 140 136  K 3.7 3.6 4.1 3.9  CL 103 105 106 101  CO2 20* 25 25 24   GLUCOSE 280* 175* 109* 130*  BUN 60* 56* 53* 61*  CREATININE 2.85* 2.59* 2.75* 2.78*  CALCIUM 8.9 8.9 8.6* 8.3*  AST 15  --   --   --   ALT 9  --   --   --   ALKPHOS 99  --   --   --   BILITOT 0.5  --   --   --    ------------------------------------------------------------------------------------------------------------------ estimated creatinine clearance is 35.4 mL/min (A) (by C-G formula based on SCr of 2.78 mg/dL (H)). ------------------------------------------------------------------------------------------------------------------ No results for input(s): HGBA1C in the last 72 hours. ------------------------------------------------------------------------------------------------------------------ No results for input(s): CHOL, HDL, LDLCALC, TRIG, CHOLHDL, LDLDIRECT in the last 72 hours. ------------------------------------------------------------------------------------------------------------------ No results for input(s): TSH, T4TOTAL, T3FREE, THYROIDAB in the last 72 hours.  Invalid input(s): FREET3 ------------------------------------------------------------------------------------------------------------------ Recent Labs    09/07/18 0435  VITAMINB12 327  FOLATE 7.9  FERRITIN 101  TIBC 204*  IRON 9*  RETICCTPCT 1.7    Coagulation profile No results for input(s): INR, PROTIME in the last 168 hours.  No results for input(s): DDIMER in the last 72 hours.  Cardiac Enzymes No results for input(s): CKMB, TROPONINI, MYOGLOBIN in the last 168 hours.  Invalid input(s): CK ------------------------------------------------------------------------------------------------------------------ Invalid input(s): [place]  Patient is 53 year old with recurrent foot infection  1.  Acute osteomyelitis of right  foot: Continue vancomycin and ceftriaxone 2.   HTN (hypertension) -home dose antihypertensives 3. Anemia has some low iron with ferritin okay we will give IV iron today as well as transfuse 1 unit of packed RBCs 4.   Diabetes (HCC) -sliding scale insulin 5.   CAD (coronary artery disease) -continue home meds 6.  Chronic systolic CHF (congestive heart failure) ([place]) -continue home meds 7. CKD (chronic kidney disease), stage IV ([place]) -at baseline, avoid nephrotoxins and monitor      Code Status Orders  (From admission, onward)         Start     Ordered   09/06/18 0259  Full code  Continuous     09/06/18 0258        Code Status History    Date Active Date Inactive Code Status Order ID Comments User Context   07/25/2018 1717 08/02/2018 1919 Full Code [id number]  Dustin Flock, MD Inpatient   05/25/2018 0352 05/29/2018 2022 Full Code [id number]  Hillary Bow, MD ED   10/09/2016 1003 10/09/2016 1340 Full Code [id number]  Samara Deist, [place] Inpatient   08/22/2016 0042 08/27/2016 1649 Full Code [id number]  Lance Coon, MD Inpatient   07/18/2014 1818 07/20/2014 2002 Full Code [id number]  Leighton Parody, PA-C Inpatient           Consults podiatry  DVT Prophylaxis  Lovenox    Lab Results  Component Value Date   PLT 302 09/08/2018     Time Spent in minutes 35 minutes greater than 50% of time spent in care coordination and counseling patient  regarding the condition and plan of care.   Dustin Flock M.D on 09/08/2018 at 10:18 AM  Between 7am to 6pm - Pager - [phone]  After 6pm go to [email or web address] - Proofreader  [employer]   Office  [phone]

## 2018-09-08 NOTE — Anesthesia Postprocedure Evaluation (Signed)
Anesthesia Post Note  Patient: Paul [name].  Procedure(s) Performed: INCISION AND DRAINAGE - DEPRIVEMENT OF DEFECTIVE SKIN, SOFT TISSUE AND BONE (Right )  Patient location during evaluation: PACU Anesthesia Type: General Level of consciousness: awake and alert Pain management: pain level controlled Vital Signs Assessment: post-procedure vital signs reviewed and stable Respiratory status: spontaneous breathing, nonlabored ventilation, respiratory function stable and patient connected to nasal cannula oxygen Cardiovascular status: blood pressure returned to baseline and stable Postop Assessment: no apparent nausea or vomiting Anesthetic complications: no     Last Vitals:  Vitals:   09/08/18 1616 09/08/18 1642  BP: 133/75 (!) 147/81  Pulse: 79 80  Resp: (!) 24   Temp: (!) 36.3 C 36.5 C  SpO2: 99% 100%    Last Pain:  Vitals:   09/08/18 1737  TempSrc:   PainSc: 9                  Lilliana Turner K Bahar Shelden

## 2018-09-09 ENCOUNTER — Inpatient Hospital Stay: Payer: Self-pay

## 2018-09-09 ENCOUNTER — Encounter
Admission: EM | Disposition: A | Discharge status: Home Health | Payer: Self-pay | Source: Home / Self Care | Attending: Internal Medicine

## 2018-09-09 ENCOUNTER — Encounter: Payer: Self-pay | Admitting: Podiatry

## 2018-09-09 DIAGNOSIS — L089 Local infection of the skin and subcutaneous tissue, unspecified: Secondary | ICD-10-CM

## 2018-09-09 DIAGNOSIS — I70234 Atherosclerosis of native arteries of right leg with ulceration of heel and midfoot: Secondary | ICD-10-CM

## 2018-09-09 DIAGNOSIS — E11628 Type 2 diabetes mellitus with other skin complications: Secondary | ICD-10-CM

## 2018-09-09 DIAGNOSIS — N184 Chronic kidney disease, stage 4 (severe): Secondary | ICD-10-CM

## 2018-09-09 DIAGNOSIS — L97416 Non-pressure chronic ulcer of right heel and midfoot with bone involvement without evidence of necrosis: Secondary | ICD-10-CM

## 2018-09-09 DIAGNOSIS — I129 Hypertensive chronic kidney disease with stage 1 through stage 4 chronic kidney disease, or unspecified chronic kidney disease: Secondary | ICD-10-CM

## 2018-09-09 DIAGNOSIS — L89610 Pressure ulcer of right heel, unstageable: Secondary | ICD-10-CM

## 2018-09-09 DIAGNOSIS — M869 Osteomyelitis, unspecified: Secondary | ICD-10-CM

## 2018-09-09 HISTORY — PX: LOWER EXTREMITY ANGIOGRAPHY: CATH118251

## 2018-09-09 LAB — CBC
HCT: 28 % — ABNORMAL LOW (ref 39.0–52.0)
Hemoglobin: 8.7 g/dL — ABNORMAL LOW (ref 13.0–17.0)
MCH: 26.2 pg (ref 26.0–34.0)
MCHC: 31.1 g/dL (ref 30.0–36.0)
MCV: 84.3 fL (ref 80.0–100.0)
Platelets: 347 10*3/uL (ref 150–400)
RBC: 3.32 MIL/uL — ABNORMAL LOW (ref 4.22–5.81)
RDW: 17.2 % — ABNORMAL HIGH (ref 11.5–15.5)
WBC: 13 10*3/uL — ABNORMAL HIGH (ref 4.0–10.5)
nRBC: 0 % (ref 0.0–0.2)

## 2018-09-09 LAB — BASIC METABOLIC PANEL
Anion gap: 13 (ref 5–15)
BUN: 66 mg/dL — ABNORMAL HIGH (ref 6–20)
CO2: 23 mmol/L (ref 22–32)
Calcium: 8.4 mg/dL — ABNORMAL LOW (ref 8.9–10.3)
Chloride: 99 mmol/L (ref 98–111)
Creatinine, Ser: 2.63 mg/dL — ABNORMAL HIGH (ref 0.61–1.24)
GFR calc Af Amer: 31 mL/min — ABNORMAL LOW (ref >60)
GFR calc non Af Amer: 27 mL/min — ABNORMAL LOW (ref >60)
Glucose, Bld: 265 mg/dL — ABNORMAL HIGH (ref 70–99)
Potassium: 4.1 mmol/L (ref 3.5–5.1)
Sodium: 135 mmol/L (ref 135–145)

## 2018-09-09 LAB — GLUCOSE, CAPILLARY
Glucose-Capillary: 224 mg/dL — ABNORMAL HIGH (ref 70–99)
Glucose-Capillary: 185 mg/dL — ABNORMAL HIGH (ref 70–99)
Glucose-Capillary: 163 mg/dL — ABNORMAL HIGH (ref 70–99)
Glucose-Capillary: 208 mg/dL — ABNORMAL HIGH (ref 70–99)

## 2018-09-09 LAB — MAGNESIUM: Magnesium: 1.7 mg/dL (ref 1.7–2.4)

## 2018-09-09 SURGERY — LOWER EXTREMITY ANGIOGRAPHY
Anesthesia: Moderate Sedation | Laterality: Right

## 2018-09-09 MED ORDER — SODIUM CHLORIDE 0.9% FLUSH
3.0000 mL | Freq: Two times a day (BID) | INTRAVENOUS | Status: DC
  Administered 2018-09-09 – 2018-09-12 (×3): 3 mL via INTRAVENOUS

## 2018-09-09 MED ORDER — SODIUM CHLORIDE 0.9 % IV SOLN: INTRAVENOUS | Status: DC

## 2018-09-09 MED ORDER — FAMOTIDINE 20 MG PO TABS: 40.0000 mg | ORAL_TABLET | Freq: Once | ORAL | Status: DC | PRN

## 2018-09-09 MED ORDER — SODIUM CHLORIDE 0.9% FLUSH: 3.0000 mL | INTRAVENOUS | Status: DC | PRN

## 2018-09-09 MED ORDER — HYDROMORPHONE HCL 1 MG/ML IJ SOLN
1.0000 mg | Freq: Once | INTRAMUSCULAR | Status: AC | PRN
  Administered 2018-09-09: 1 mg via INTRAVENOUS

## 2018-09-09 MED ORDER — FENTANYL CITRATE (PF) 100 MCG/2ML IJ SOLN
INTRAMUSCULAR | Status: DC | PRN
  Administered 2018-09-09 (×3): 50 ug via INTRAVENOUS

## 2018-09-09 MED ORDER — MIDAZOLAM HCL 5 MG/5ML IJ SOLN
INTRAMUSCULAR | Status: AC
  Filled 2018-09-09: qty 5

## 2018-09-09 MED ORDER — CLOPIDOGREL BISULFATE 75 MG PO TABS
300.0000 mg | ORAL_TABLET | Freq: Once | ORAL | Status: AC
  Administered 2018-09-09: 300 mg via ORAL
  Filled 2018-09-09: qty 4

## 2018-09-09 MED ORDER — SODIUM CHLORIDE 0.9 % IV SOLN: 250.0000 mL | INTRAVENOUS | Status: DC | PRN

## 2018-09-09 MED ORDER — MIDAZOLAM HCL 2 MG/ML PO SYRP: 8.0000 mg | ORAL_SOLUTION | Freq: Once | ORAL | Status: DC | PRN

## 2018-09-09 MED ORDER — ONDANSETRON HCL 4 MG/2ML IJ SOLN: 4.0000 mg | Freq: Four times a day (QID) | INTRAMUSCULAR | Status: DC | PRN

## 2018-09-09 MED ORDER — HYDROMORPHONE HCL 1 MG/ML IJ SOLN
INTRAMUSCULAR | Status: AC
  Filled 2018-09-09: qty 1

## 2018-09-09 MED ORDER — IOHEXOL 300 MG/ML  SOLN
INTRAMUSCULAR | Status: DC | PRN
  Administered 2018-09-09: 17:00:00 110 mL via INTRAVENOUS

## 2018-09-09 MED ORDER — METHYLPREDNISOLONE SODIUM SUCC 125 MG IJ SOLR: 125.0000 mg | Freq: Once | INTRAMUSCULAR | Status: DC | PRN

## 2018-09-09 MED ORDER — DIPHENHYDRAMINE HCL 50 MG/ML IJ SOLN: 50.0000 mg | Freq: Once | INTRAMUSCULAR | Status: DC | PRN

## 2018-09-09 MED ORDER — HEPARIN SODIUM (PORCINE) 1000 UNIT/ML IJ SOLN
INTRAMUSCULAR | Status: DC | PRN
  Administered 2018-09-09: 5000 [IU] via INTRAVENOUS

## 2018-09-09 MED ORDER — SODIUM CHLORIDE 0.9 % IV SOLN: INTRAVENOUS | Status: AC

## 2018-09-09 MED ORDER — MIDAZOLAM HCL 2 MG/2ML IJ SOLN
INTRAMUSCULAR | Status: DC | PRN
  Administered 2018-09-09: 2 mg via INTRAVENOUS
  Administered 2018-09-09 (×2): 1 mg via INTRAVENOUS

## 2018-09-09 MED ORDER — MORPHINE SULFATE (PF) 4 MG/ML IV SOLN
2.0000 mg | INTRAVENOUS | Status: DC | PRN
  Administered 2018-09-09 – 2018-09-10 (×2): 2 mg via INTRAVENOUS
  Filled 2018-09-09 (×2): qty 1

## 2018-09-09 MED ORDER — HEPARIN SODIUM (PORCINE) 1000 UNIT/ML IJ SOLN
INTRAMUSCULAR | Status: AC
  Filled 2018-09-09: qty 1

## 2018-09-09 MED ORDER — DEXTROSE 5 % IV SOLN
2000.0000 mg | Freq: Once | INTRAVENOUS | Status: AC
  Administered 2018-09-09: 2000 mg via INTRAVENOUS

## 2018-09-09 MED ORDER — FENTANYL CITRATE (PF) 100 MCG/2ML IJ SOLN
INTRAMUSCULAR | Status: AC
  Filled 2018-09-09: qty 2

## 2018-09-09 MED ORDER — LIDOCAINE-EPINEPHRINE (PF) 1 %-1:200000 IJ SOLN
INTRAMUSCULAR | Status: AC
  Filled 2018-09-09: qty 30

## 2018-09-09 MED ORDER — CLOPIDOGREL BISULFATE 75 MG PO TABS
75.0000 mg | ORAL_TABLET | Freq: Every day | ORAL | Status: DC
  Administered 2018-09-10 – 2018-09-12 (×3): 75 mg via ORAL
  Filled 2018-09-09 (×3): qty 1

## 2018-09-09 SURGICAL SUPPLY — 29 items
BALLN LUTONIX DCB 4X40X130 (BALLOONS)
BALLN LUTONIX DCB 4X80X130 (BALLOONS) ×3
BALLN ULTRASCORE 4X40X130 (BALLOONS) ×3
BALLOON LUTONIX DCB 4X40X130 (BALLOONS) IMPLANT
BALLOON LUTONIX DCB 4X80X130 (BALLOONS) ×1 IMPLANT
BALLOON ULTRASCORE 4X40X130 (BALLOONS) ×1 IMPLANT
CATH PIG 70CM (CATHETERS) ×3 IMPLANT
CATH VERT 5FR 125CM (CATHETERS) ×3 IMPLANT
COVER PROBE U/S 5X48 (MISCELLANEOUS) ×3 IMPLANT
DERMABOND ADVANCED (GAUZE/BANDAGES/DRESSINGS) ×2
DERMABOND ADVANCED .7 DNX12 (GAUZE/BANDAGES/DRESSINGS) ×1 IMPLANT
DEVICE PRESTO INFLATION (MISCELLANEOUS) ×3 IMPLANT
DEVICE SAFEGUARD 24CM (GAUZE/BANDAGES/DRESSINGS) ×3 IMPLANT
DEVICE STARCLOSE SE CLOSURE (Vascular Products) ×3 IMPLANT
DRAPE INCISE IOBAN 66X45 STRL (DRAPES) ×3 IMPLANT
GLIDEWIRE ADV .035X260CM (WIRE) ×3 IMPLANT
KIT CVC 2 LUMEN POWERLINE 6FR (CATHETERS) ×1 IMPLANT
KIT DUAL LUMEN POWERLINE 6FR (CATHETERS) ×2
NEEDLE ENTRY 21GA 7CM ECHOTIP (NEEDLE) ×3 IMPLANT
PACK ANGIOGRAPHY (CUSTOM PROCEDURE TRAY) ×3 IMPLANT
SET INTRO CAPELLA COAXIAL (SET/KITS/TRAYS/PACK) ×3 IMPLANT
SHEATH BRITE TIP 5FRX11 (SHEATH) ×3 IMPLANT
SHEATH RAABE 6FR (SHEATH) ×3 IMPLANT
SUT MNCRL 4-0 (SUTURE) ×2
SUT MNCRL 4-0 27XMFL (SUTURE) ×1
SUTURE MNCRL 4-0 27XMF (SUTURE) ×1 IMPLANT
SYR MEDRAD MARK 7 150ML (SYRINGE) ×3 IMPLANT
TUBING CONTRAST HIGH PRESS 72 (TUBING) ×3 IMPLANT
WIRE J 3MM .035X145CM (WIRE) ×3 IMPLANT

## 2018-09-09 NOTE — Progress Notes (Signed)
[facility] Podiatry                                                      Patient Demographics  Paul [name], is a 53 y.o. male   MRN: [id number]   DOB - 21-Jun-1965  Admit Date - 09/05/2018    Outpatient Primary MD for the patient is Tracie Harrier, MD  Consult requested in the Hospital by Dustin Flock, MD, On 09/09/2018    Past Medical History:  Diagnosis Date  . Anemia   . Cardiac defibrillator in place    a. Biotronik LUmax 540 DRT, (ser # [id number]).  . Carotid arterial disease ([place])    a. s/p prior LICA stenting;  b. 01/3715 Carotid U/S: 40-59% bilat ICA stenosis. Patent LICA stent.  . CHF (congestive heart failure) ([place])   . CKD (chronic kidney disease), stage III ([place])   . Coronary artery disease    a. 2010 s/p CABG x 3.  . DDD (degenerative disc disease), lumbosacral    L5-S1  . Diabetes ([place])    Lantus at bedtime  . Gangrene of toe of right foot ([place])   . Gout of left hand 10/06/2016  . HFrEF (heart failure with reduced ejection fraction) ([place])    a. 07/2016 Echo: EF 25-30%, diff HK, mild MR, mildly dil LA, mod reduced RV fxn, PASP 72mmHg.  Marland Kitchen Hypertension    takes Coreg daily  . Ischemic cardiomyopathy    a. 07/2016 Echo: EF 25-30%, diff HK.  . Myocardial infarction ([place]) 2009  . Peripheral vascular disease ([employer] [facility])   . Sleep apnea    sleep study yr ago-unable to afford cpap  . Stroke [facility]) 09   no weakness      Past Surgical History:  Procedure Laterality Date  . AMPUTATION TOE Right 08/22/2016   Procedure: AMPUTATION TOE;  Surgeon: Samara Deist, DPM;  Location: [facility] ORS;  Service: Podiatry;  Laterality: Right;  . AMPUTATION TOE Right 10/09/2016   Procedure: AMPUTATION TOE-RIGHT 2ND MPJ;  Surgeon: Samara Deist, DPM;  Location: [facility] OR[facility];  Service: Podiatry;  Laterality: Right;  . APLIGRAFT PLACEMENT Right 10/09/2016   Procedure: APLIGRAFT  PLACEMENT;  Surgeon: Samara Deist, DPM;  Location: [facility] OR[facility];  Service: Podiatry;  Laterality: Right;  . CALCANEAL OSTEOTOMY Bilateral 07/28/2018   Procedure: RIGHT CALCANECTOMY BILATERAL DEBRIDEMENT OF ULCERS ON HEELS;  Surgeon: Albertine Patricia, DPM;  Location: [facility] OR[facility];  Service: Podiatry;  Laterality: Bilateral;  . CARDIAC DEFIBRILLATOR PLACEMENT  2011  . CAROTID ENDARTERECTOMY Left   . CHOLECYSTECTOMY  2010  . CORONARY ARTERY BYPASS GRAFT  2010   CABG x 3   . GRAFT APPLICATION Right 9/67/8938   Procedure: FULL THICKNESS SKIN GRAFT-RIGHT FOOT;  Surgeon: Algernon Huxley, MD;  Location: [facility] OR[facility];  Service: Vascular;  Laterality: Right;  . HIP SURGERY Right 1994  . INCISION AND DRAINAGE OF WOUND Right 08/22/2016   Procedure: IRRIGATION AND DEBRIDEMENT WOUND and wound vac placement;  Surgeon: Samara Deist, DPM;  Location: [facility];  Service: Podiatry;  Laterality: Right;  . LOWER EXTREMITY ANGIOGRAPHY Right 08/24/2016   Procedure: Lower Extremity Angiography;  Surgeon: Algernon Huxley, MD;  Location: [place] [facility];  Service: Cardiovascular;  Laterality: Right;  . LOWER EXTREMITY ANGIOGRAPHY Right 07/27/2018   Procedure: RIGHT Lower Extremity Angiography;  Surgeon: Algernon Huxley, MD;  Location: [place] [facility];  Service: Cardiovascular;  Laterality: Right;  . MASS EXCISION Right 07/18/2014   Procedure: EXCISION HETEROTOPIC BONE RIGHT HIP;  Surgeon: Frederik Pear, MD;  Location: [place];  Service: Orthopedics;  Laterality: Right;  . Open Heart Surgery  2010   x 3  . WOUND DEBRIDEMENT Right 10/09/2016   Procedure: DEBRIDEMENT WOUND;  Surgeon: Samara Deist, DPM;  Location: [facility];  Service: Podiatry;  Laterality: Right;    in for   Chief Complaint  Patient presents with  . Wound Infection     HPI  Paul [name]  is a 53 y.o. male, 1 day postop following excision of infected Achilles tendon and excision of posterior calcaneus due to long-term exposure in the presence of infection.  Heel  bone was also preventing granulation coverage over the area. Patient had a chronic long-term right side decubitus ulcer for more than 6 months.    Anti-infectives (From admission, onward)   Start     Dose/Rate Route Frequency Ordered Stop   09/08/18 2300  ceFAZolin (ANCEF) IVPB 1 g/50 mL premix     1 g 100 mL/hr over 30 Minutes Intravenous  Once 09/08/18 2257 09/09/18 0005   09/08/18 1431  vancomycin (VANCOCIN) powder  Status:  Discontinued       As needed 09/08/18 1431 09/08/18 1534   09/08/18 1430  gentamicin (GARAMYCIN) injection  Status:  Discontinued       As needed 09/08/18 1431 09/08/18 1534   09/07/18 0243  vancomycin (VANCOCIN) IVPB 750 mg/150 ml premix     750 mg 150 mL/hr over 60 Minutes Intravenous Every 24 hours 09/07/18 0244     09/07/18 0130  vancomycin (VANCOCIN) IVPB 750 mg/150 ml premix  Status:  Discontinued     750 mg 150 mL/hr over 60 Minutes Intravenous Every 24 hours 09/06/18 0353 09/07/18 0244   09/06/18 2200  cefTRIAXone (ROCEPHIN) 2 g in sodium chloride 0.9 % 100 mL IVPB     2 g 200 mL/hr over 30 Minutes Intravenous Every 24 hours 09/06/18 2152     09/06/18 0400  piperacillin-tazobactam (ZOSYN) IVPB 3.375 g  Status:  Discontinued     3.375 g 12.5 mL/hr over 240 Minutes Intravenous Every 8 hours 09/06/18 0353 09/06/18 1907   09/05/18 2345  vancomycin (VANCOCIN) 2,000 mg in sodium chloride 0.9 % 500 mL IVPB     2,000 mg 250 mL/hr over 120 Minutes Intravenous  Once 09/05/18 2338 09/06/18 0329   09/05/18 2330  vancomycin (VANCOCIN) IVPB 1000 mg/200 mL premix  Status:  Discontinued     1,000 mg 200 mL/hr over 60 Minutes Intravenous  Once 09/05/18 2316 09/05/18 2338   09/05/18 2330  cefTRIAXone (ROCEPHIN) 2 g in sodium chloride 0.9 % 100 mL IVPB     2 g 200 mL/hr over 30 Minutes Intravenous  Once 09/05/18 2316 09/06/18 0129      Scheduled Meds: . amiodarone  200 mg Oral BID  . aspirin EC  81 mg Oral Daily  . carvedilol  3.125 mg Oral BID  . gabapentin   300 mg Oral TID  . heparin  5,000 Units Subcutaneous Q8H  . insulin aspart  0-5 Units Subcutaneous QHS  . insulin aspart  0-9 Units Subcutaneous TID WC  . insulin glargine  20 Units Subcutaneous q morning - 10a  . multivitamin-lutein  1 capsule Oral Daily  . mupirocin ointment   Nasal BID  . predniSONE  5 mg Oral Q breakfast  . Ensure  Max Protein  11 oz Oral TID BM  . torsemide  40 mg Oral q morning - 10a   And  . torsemide  20 mg Oral QPM   Continuous Infusions: . sodium chloride 0 mL/hr at 09/06/18 1459  . sodium chloride 75 mL/hr at 09/09/18 0100  . cefTRIAXone (ROCEPHIN)  IV 2 g (09/08/18 2140)  . vancomycin 750 mg (09/09/18 0240)   PRN Meds:.sodium chloride, acetaminophen **OR** acetaminophen, ondansetron **OR** ondansetron (ZOFRAN) IV, oxyCODONE  Allergies  Allergen Reactions  . Other Other (See Comments)    Cardiac Problems. Pt states he tolerates Toradol. Due to kidney and heart problems per pt  . Ibuprofen Other (See Comments)    Heart problems  . Baclofen Other (See Comments)  . Metformin Diarrhea  . Nsaids     Due to kidney and heart problems per pt    Physical Exam: Patient states that her use doing fairly well some pain but controlled by pain meds.  He is alert and well oriented afebrile scheduled for a catheterization of the lower extremity today to see if is any other improvements that can be made.  Vitals  Blood pressure 130/74, pulse 80, temperature 97.7 F (36.5 C), temperature source Oral, resp. rate 18, height 5\' 9"  (1.753 m), weight 97.5 kg, SpO2 100 %.  Lower Extremity exam: Bandages are clean dry and intact.  Some pain associated with the area but overall he is managing.  No undue swelling at this timeframe.  Left heel is also wrapped up.  Examination left heel yesterday showed extensive improvement with the healing of the left heel decubitus ulcer that we treated before also.  Data Review  CBC Recent Labs  Lab 09/05/18 1754 09/06/18 0411  09/07/18 0435 09/08/18 0325 09/09/18 0336  WBC 12.8* 15.7* 12.1* 12.5* 13.0*  HGB 7.7* 8.7* 7.4* 6.9* 8.7*  HCT 26.0* 29.2* 25.3* 23.4* 28.0*  PLT 327 390 329 302 347  MCV 86.4 86.1 86.6 85.7 84.3  MCH 25.6* 25.7* 25.3* 25.3* 26.2  MCHC 29.6* 29.8* 29.2* 29.5* 31.1  RDW 17.5* 17.6* 17.7* 17.8* 17.2*  LYMPHSABS 0.9  --   --   --   --   MONOABS 0.7  --   --   --   --   EOSABS 0.0  --   --   --   --   BASOSABS 0.0  --   --   --   --    ------------------------------------------------------------------------------------------------------------------  Chemistries  Recent Labs  Lab 09/05/18 1754 09/06/18 0411 09/07/18 0435 09/08/18 0325 09/09/18 0336  NA 136 138 140 136 135  K 3.7 3.6 4.1 3.9 4.1  CL 103 105 106 101 99  CO2 20* 25 25 24 23   GLUCOSE 280* 175* 109* 130* 265*  BUN 60* 56* 53* 61* 66*  CREATININE 2.85* 2.59* 2.75* 2.78* 2.63*  CALCIUM 8.9 8.9 8.6* 8.3* 8.4*  MG  --   --   --   --  1.7  AST 15  --   --   --   --   ALT 9  --   --   --   --   ALKPHOS 99  --   --   --   --   BILITOT 0.5  --   --   --   --    ------------------------------------------------------------------------------------------------------------------ estimated creatinine clearance is 37.4 mL/min (A) (by C-G formula based on SCr of 2.63 mg/dL (H)). ------------------------------------------------------------------------------------------------------------------ No results for input(s): TSH, T4TOTAL, T3FREE, THYROIDAB in  the last 72 hours.  Invalid input(s): FREET3 Urinalysis    Component Value Date/Time   COLORURINE YELLOW (A) 05/25/2018 0049   APPEARANCEUR CLEAR (A) 05/25/2018 0049   LABSPEC 1.013 05/25/2018 0049   PHURINE 5.0 05/25/2018 0049   GLUCOSEU 50 (A) 05/25/2018 0049   HGBUR NEGATIVE 05/25/2018 0049   BILIRUBINUR NEGATIVE 05/25/2018 0049   KETONESUR NEGATIVE 05/25/2018 0049   PROTEINUR NEGATIVE 05/25/2018 0049   UROBILINOGEN 1.0 07/16/2014 1558   NITRITE NEGATIVE 05/25/2018  0049   LEUKOCYTESUR NEGATIVE 05/25/2018 0049     Imaging results:   Ct Ankle Right Wo Contrast  Result Date: 09/07/2018 CLINICAL DATA:  Chronic wound on the heel. EXAM: CT OF THE RIGHT ANKLE WITHOUT CONTRAST TECHNIQUE: Multidetector CT imaging of the right ankle was performed according to the standard protocol. Multiplanar CT image reconstructions were also generated. COMPARISON:  CT right foot 05/25/2018. Plain films right foot 09/05/2018. FINDINGS: Bones/Joint/Cartilage No bony destructive change or periosteal reaction is identified. No fracture or dislocation. Mid to distal transmetatarsal amputations again seen. Ligaments Suboptimally assessed by CT. Muscles and Tendons Since the prior CT, the patient has developed fluid containing a small amount of gas within and anterior to the distal Achilles tendon. Discrete measurement is difficult but fluid measures approximately 2.3 cm transverse by 1.3 cm AP by approximately 7 cm craniocaudal. The Achilles tendon appears completely torn 2 cm above its insertion on the calcaneus. Soft tissues There is subcutaneous edema about the foot. Subcutaneous edema is seen about the ankle and foot. IMPRESSION: Large skin wound over the heel. Findings highly suspicious for septic Achilles tenosynovitis and likely disruption of the Achilles tendon are identified. If the patient is able, MRI with and without contrast would be better for evaluation of these findings. Negative for CT evidence of osteomyelitis. Status post mid to distal transmetatarsal amputations as soon on prior exams. Electronically Signed   By: Inge Rise M.D.   On: 09/07/2018 15:08    Assessment & Plan: Leave the dressing intact today I will change it tomorrow.  I have not started the wound VAC again on the right or the left heel the left heel may not needed it may continue to granulate in with wet-to-dry dressings.  The right heel I was able to close 95% of its will have to see if it will continue  to heal and granulate and fill in across the back of that region hopefully now that there is no infectious tissue in the region.  Did have to remove the distal portion of the Achilles tendon as result of necrosis and infection.  Posterior calcaneus was removed because of the worry for infection and also the loss of skin and soft tissue to be able to cover that area.  Hopefully would be able to close most of it will go on and heal and give him something he can walk on.  I will change the dressings on both tomorrow.  I did order some protector boots for both heels.  Principal Problem:   Acute osteomyelitis of right foot ([place]) Active Problems:   HTN (hypertension)   Diabetes ([place])   CAD (coronary artery disease)   Chronic systolic CHF (congestive heart failure) ([facility])   CKD (chronic kidney disease), stage IV ([facility])   Acute osteomyelitis of right ankle or foot ([facility])   Pressure ulcer of heel, right, unstageable ([place])   Family Communication: Plan discussed with patient.  Albertine Patricia M.D on 09/09/2018 at 8:57 AM  Thank you for the  consult, we will follow the patient with you in the Hospital.

## 2018-09-09 NOTE — Progress Notes (Signed)
Inpatient Diabetes Program Recommendations  AACE/ADA: New Consensus Statement on Inpatient Glycemic Control (2015)  Target Ranges:  Prepandial:   less than 140 mg/dL      Peak postprandial:   less than 180 mg/dL (1-2 hours)      Critically ill patients:  140 - 180 mg/dL   Lab Results  Component Value Date   GLUCAP 224 (H) 09/09/2018   HGBA1C 7.9 (H) 05/25/2018    Review of Glycemic Control Results for Paul [name], Paul [name] (MRN [id number]) as of 09/09/2018 09:38  Ref. Range 09/08/2018 08:13 09/08/2018 12:04 09/08/2018 15:38 09/08/2018 21:28 09/09/2018 07:45  Glucose-Capillary Latest Ref Range: 70 - 99 mg/dL 105 (H) 89 88 304 (H) 224 (H)  Diabetes history: DM 2 Outpatient Diabetes medications:  Lantus 20 units q HS Current orders for Inpatient glycemic control:  Novolog sensitive tid with meals and HS Lantus 20 units q AM  Inpatient Diabetes Program Recommendations:    Note that fasting CBG>200 mg/dL.  Per chart review patient did not receive Lantus on 09/08/18 due to NPO status and CBG's less than 100 mg/dL.    CBG's increase this morning, however should improve with basal insulin scheduled for this AM.   Thanks,  Adah Perl, RN, BC-ADM Inpatient Diabetes Coordinator Pager [phone] (8a-5p)

## 2018-09-09 NOTE — Progress Notes (Addendum)
Patient still complaining about new IV started by IV nurse Heather. He states that he does not want the IV team to try and start another IV tonight. This RN flushed site and it seemed to be working well however patient refused for 1200 midnight infusion to be started. Patient had aware that he had another antibiotic to be infused at 245. Patient refused stating it can be taken care of in the morning. He wanted to rest and not be disturbed.

## 2018-09-09 NOTE — Progress Notes (Signed)
Spoke with Raquel Sarna RN and recommended a PICC line for this patient. The patient has had 4 PIVs since admission and is still needing access for ABT's and other IV fluids. MD Posey Pronto also made aware of recommendation.

## 2018-09-09 NOTE — Progress Notes (Signed)
[place] at [facility]                                                                                                                                                                                  Patient Demographics   Paul [name], is a 53 y.o. male, DOB - 06/11/1965, NOM:[id number]  Admit date - 09/05/2018   Admitting Physician Lance Coon, MD  Outpatient Primary MD for the patient is Tracie Harrier, MD   LOS - 3  Subjective: Patient currently denying any symptoms he states that he is feeling well Patient has a poor IV access per nurse Await angiogram today  Review of Systems:   CONSTITUTIONAL: No documented fever. No fatigue, weakness. No weight gain, no weight loss.  EYES: No blurry or double vision.  ENT: No tinnitus. No postnasal drip. No redness of the oropharynx.  RESPIRATORY: No cough, no wheeze, no hemoptysis. No dyspnea.  CARDIOVASCULAR: No chest pain. No orthopnea. No palpitations. No syncope.  GASTROINTESTINAL: No nausea, no vomiting or diarrhea. No abdominal pain. No melena or hematochezia.  GENITOURINARY: No dysuria or hematuria.  ENDOCRINE: No polyuria or nocturia. No heat or cold intolerance.  HEMATOLOGY: No anemia. No bruising. No bleeding.  INTEGUMENTARY: Positive drainage from the foot MUSCULOSKELETAL: No arthritis. No swelling. No gout.  Positive neck pain NEUROLOGIC: No numbness, tingling, or ataxia. No seizure-type activity.  PSYCHIATRIC: No anxiety. No insomnia. No ADD.    Vitals:   Vitals:   09/08/18 1642 09/08/18 2016 09/09/18 0550 09/09/18 0748  BP: (!) 147/81 136/77 111/68 130/74  Pulse: 80 90 86 80  Resp:  18 18 18   Temp: 97.7 F (36.5 C) 97.6 F (36.4 C) 98 F (36.7 C) 97.7 F (36.5 C)  TempSrc: Oral Oral Oral Oral  SpO2: 100% 97% 97% 100%  Weight:      Height:        Wt Readings from Last 3 Encounters:  09/06/18 97.5 kg  07/25/18 94.8 kg  06/08/18 100.8 kg     Intake/Output Summary (Last 24  hours) at 09/09/2018 1035 Last data filed at 09/09/2018 0900 Gross per 24 hour  Intake 1713.7 ml  Output 880 ml  Net 833.7 ml    Physical Exam:   GENERAL: Pleasant-appearing in no apparent distress.  HEAD, EYES, EARS, NOSE AND THROAT: Atraumatic, normocephalic. Extraocular muscles are intact. Pupils equal and reactive to light. Sclerae anicteric. No conjunctival injection. No oro-pharyngeal erythema.  NECK: Supple. There is no jugular venous distention. No bruits, no lymphadenopathy, no thyromegaly.  HEART: Regular rate and rhythm,. No murmurs, no rubs, no clicks.  LUNGS: Clear to auscultation bilaterally. No rales or rhonchi. No wheezes.  ABDOMEN: Soft, flat, nontender, nondistended. Has good bowel sounds. No hepatosplenomegaly appreciated.  EXTREMITIES: Dressing in place both lower extremity nEUROLOGIC: The patient is alert, awake, and oriented x3 with no focal motor or sensory deficits appreciated bilaterally.  SKIN: Both feet dressing in place.  Psych: Not anxious, depressed LN: No inguinal LN enlargement    Antibiotics   Anti-infectives (From admission, onward)   Start     Dose/Rate Route Frequency Ordered Stop   09/08/18 2300  ceFAZolin (ANCEF) IVPB 1 g/50 mL premix     1 g 100 mL/hr over 30 Minutes Intravenous  Once 09/08/18 2257 09/09/18 0005   09/08/18 1431  vancomycin (VANCOCIN) powder  Status:  Discontinued       As needed 09/08/18 1431 09/08/18 1534   09/08/18 1430  gentamicin (GARAMYCIN) injection  Status:  Discontinued       As needed 09/08/18 1431 09/08/18 1534   09/07/18 0243  vancomycin (VANCOCIN) IVPB 750 mg/150 ml premix     750 mg 150 mL/hr over 60 Minutes Intravenous Every 24 hours 09/07/18 0244     09/07/18 0130  vancomycin (VANCOCIN) IVPB 750 mg/150 ml premix  Status:  Discontinued     750 mg 150 mL/hr over 60 Minutes Intravenous Every 24 hours 09/06/18 0353 09/07/18 0244   09/06/18 2200  cefTRIAXone (ROCEPHIN) 2 g in sodium chloride 0.9 % 100 mL IVPB     2  g 200 mL/hr over 30 Minutes Intravenous Every 24 hours 09/06/18 2152     09/06/18 0400  piperacillin-tazobactam (ZOSYN) IVPB 3.375 g  Status:  Discontinued     3.375 g 12.5 mL/hr over 240 Minutes Intravenous Every 8 hours 09/06/18 0353 09/06/18 1907   09/05/18 2345  vancomycin (VANCOCIN) 2,000 mg in sodium chloride 0.9 % 500 mL IVPB     2,000 mg 250 mL/hr over 120 Minutes Intravenous  Once 09/05/18 2338 09/06/18 0329   09/05/18 2330  vancomycin (VANCOCIN) IVPB 1000 mg/200 mL premix  Status:  Discontinued     1,000 mg 200 mL/hr over 60 Minutes Intravenous  Once 09/05/18 2316 09/05/18 2338   09/05/18 2330  cefTRIAXone (ROCEPHIN) 2 g in sodium chloride 0.9 % 100 mL IVPB     2 g 200 mL/hr over 30 Minutes Intravenous  Once 09/05/18 2316 09/06/18 0129      Medications   Scheduled Meds: . amiodarone  200 mg Oral BID  . aspirin EC  81 mg Oral Daily  . carvedilol  3.125 mg Oral BID  . gabapentin  300 mg Oral TID  . heparin  5,000 Units Subcutaneous Q8H  . insulin aspart  0-5 Units Subcutaneous QHS  . insulin aspart  0-9 Units Subcutaneous TID WC  . insulin glargine  20 Units Subcutaneous q morning - 10a  . multivitamin-lutein  1 capsule Oral Daily  . mupirocin ointment   Nasal BID  . predniSONE  5 mg Oral Q breakfast  . Ensure Max Protein  11 oz Oral TID BM  . torsemide  40 mg Oral q morning - 10a   And  . torsemide  20 mg Oral QPM   Continuous Infusions: . sodium chloride 0 mL/hr at 09/06/18 1459  . sodium chloride Stopped (09/09/18 0833)  . cefTRIAXone (ROCEPHIN)  IV Stopped (09/08/18 2344)  . vancomycin 750 mg (09/09/18 0240)   PRN Meds:.sodium chloride, acetaminophen **OR** acetaminophen, ondansetron **OR** ondansetron (ZOFRAN) IV, oxyCODONE   Data Review:   Micro Results Recent Results (from the past 240 hour(s))  SARS  Coronavirus 2 (CEPHEID - Performed in [facility] hospital lab), Hosp Order     Status: None   Collection Time: 09/05/18 11:49 PM  Result Value Ref Range  Status   SARS Coronavirus 2 NEGATIVE NEGATIVE Final    Comment: (NOTE) If result is NEGATIVE SARS-CoV-2 target nucleic acids are NOT DETECTED. The SARS-CoV-2 RNA is generally detectable in upper and lower  respiratory specimens during the acute phase of infection. The lowest  concentration of SARS-CoV-2 viral copies this assay can detect is 250  copies / mL. A negative result does not preclude SARS-CoV-2 infection  and should not be used as the sole basis for treatment or other  patient management decisions.  A negative result may occur with  improper specimen collection / handling, submission of specimen other  than nasopharyngeal swab, presence of viral mutation(s) within the  areas targeted by this assay, and inadequate number of viral copies  (<250 copies / mL). A negative result must be combined with clinical  observations, patient history, and epidemiological information. If result is POSITIVE SARS-CoV-2 target nucleic acids are DETECTED. The SARS-CoV-2 RNA is generally detectable in upper and lower  respiratory specimens dur ing the acute phase of infection.  Positive  results are indicative of active infection with SARS-CoV-2.  Clinical  correlation with patient history and other diagnostic information is  necessary to determine patient infection status.  Positive results do  not rule out bacterial infection or co-infection with other viruses. If result is PRESUMPTIVE POSTIVE SARS-CoV-2 nucleic acids MAY BE PRESENT.   A presumptive positive result was obtained on the submitted specimen  and confirmed on repeat testing.  While 2019 novel coronavirus  (SARS-CoV-2) nucleic acids may be present in the submitted sample  additional confirmatory testing may be necessary for epidemiological  and / or clinical management purposes  to differentiate between  SARS-CoV-2 and other Sarbecovirus currently known to infect humans.  If clinically indicated additional testing with an alternate  test  methodology [phone]) is advised. The SARS-CoV-2 RNA is generally  detectable in upper and lower respiratory sp ecimens during the acute  phase of infection. The expected result is Negative. Fact Sheet for Patients:  StrictlyIdeas.no Fact Sheet for Healthcare Providers: BankingDealers.co.za This test is not yet approved or cleared by the [place] FDA and has been authorized for detection and/or diagnosis of SARS-CoV-2 by FDA under an Emergency Use Authorization (EUA).  This EUA will remain in effect (meaning this test can be used) for the duration of the COVID-19 declaration under Section 564(b)(1) of the Act, 21 U.S.C. section 360bbb-3(b)(1), unless the authorization is terminated or revoked sooner. Performed at [facility], [address] [place], [place] [address]   MRSA PCR Screening     Status: Abnormal   Collection Time: 09/06/18  4:35 AM  Result Value Ref Range Status   MRSA by PCR POSITIVE (A) NEGATIVE Final    Comment:        The GeneXpert MRSA Assay (FDA approved for NASAL specimens only), is one component of a comprehensive MRSA colonization surveillance program. It is not intended to diagnose MRSA infection nor to guide or monitor treatment for MRSA infections. RESULT CALLED TO, READ BACK BY AND VERIFIED WITH: JENNIFER COLE @0812  ON 09/06/2018 BY FMW Performed at [facility], [address] [place], [place] [address]   Aerobic/Anaerobic Culture (surgical/deep wound)     Status: None (Preliminary result)   Collection Time: 09/08/18  2:25 PM  Result Value Ref Range  Status   Specimen Description   Final    TISSUE ACHILLES TENDON SOFT TISSUE Performed at [facility], [address] [place], [place] [address]    Special Requests   Final    NONE Performed at [facility], [address] [place], [place] [address]    Gram Stain   Final    MODERATE WBC  PRESENT,BOTH PMN AND MONONUCLEAR FEW GRAM POSITIVE COCCI    Culture   Final    TOO YOUNG TO READ Performed at [facility], [place] [address] [place], [place] [address]    Report Status PENDING  Incomplete    Radiology Reports Ct Ankle Right Wo Contrast  Result Date: 09/07/2018 CLINICAL DATA:  Chronic wound on the heel. EXAM: CT OF THE RIGHT ANKLE WITHOUT CONTRAST TECHNIQUE: Multidetector CT imaging of the right ankle was performed according to the standard protocol. Multiplanar CT image reconstructions were also generated. COMPARISON:  CT right foot 05/25/2018. Plain films right foot 09/05/2018. FINDINGS: Bones/Joint/Cartilage No bony destructive change or periosteal reaction is identified. No fracture or dislocation. Mid to distal transmetatarsal amputations again seen. Ligaments Suboptimally assessed by CT. Muscles and Tendons Since the prior CT, the patient has developed fluid containing a small amount of gas within and anterior to the distal Achilles tendon. Discrete measurement is difficult but fluid measures approximately 2.3 cm transverse by 1.3 cm AP by approximately 7 cm craniocaudal. The Achilles tendon appears completely torn 2 cm above its insertion on the calcaneus. Soft tissues There is subcutaneous edema about the foot. Subcutaneous edema is seen about the ankle and foot. IMPRESSION: Large skin wound over the heel. Findings highly suspicious for septic Achilles tenosynovitis and likely disruption of the Achilles tendon are identified. If the patient is able, MRI with and without contrast would be better for evaluation of these findings. Negative for CT evidence of osteomyelitis. Status post mid to distal transmetatarsal amputations as soon on prior exams. Electronically Signed   By: Inge Rise M.D.   On: 09/07/2018 15:08   US Carotid Bilateral  Result Date: 09/07/2018 CLINICAL DATA:  Hypertension, previous stroke, coronary artery disease, left visual disturbance, diabetes,  history of left carotid stent/endarterectomy. EXAM: BILATERAL CAROTID DUPLEX ULTRASOUND TECHNIQUE: Pearline Cables scale imaging, color Doppler and duplex ultrasound were performed of bilateral carotid and vertebral arteries in the neck. COMPARISON:  None. FINDINGS: Criteria: Quantification of carotid stenosis is based on velocity parameters that correlate the residual internal carotid diameter with NASCET-based stenosis levels, using the diameter of the distal internal carotid lumen as the denominator for stenosis measurement. The following velocity measurements were obtained: RIGHT ICA: 132/55 cm/sec CCA: 68/11 cm/sec SYSTOLIC ICA/CCA RATIO:  1.4 ECA: 264 cm/sec LEFT ICA: 117/48 cm/sec CCA: 57/26 cm/sec SYSTOLIC ICA/CCA RATIO:  1.5 ECA: 153 cm/sec RIGHT CAROTID ARTERY: Mild partially calcified plaque in the mid and distal common carotid artery, bulb, proximal ECA and ICA. There is at least mild stenosis in the proximal ECA. No high-grade ICA stenosis. Normal waveforms and color Doppler signal. RIGHT VERTEBRAL ARTERY:  Normal flow direction and waveform. LEFT CAROTID ARTERY: Patent stent from the distal common carotid artery across the bulb into the proximal ICA. No high-grade stenosis. Normal waveforms and color Doppler signal. Atheromatous plaque in mid and distal ICA. Continued antegrade flow into the external carotid artery. LEFT VERTEBRAL ARTERY:  Normal flow direction and waveform. IMPRESSION: 1. Right carotid bifurcation plaque resulting in less than 50% diameter ICA stenosis. 2. Patent left carotid stent without evidence  of high-grade residual/recurrent stenosis. 3.  Antegrade bilateral vertebral arterial flow. Electronically Signed   By: Lucrezia Europe M.D.   On: 09/07/2018 08:42   Dg Foot 2 Views Right  Result Date: 09/05/2018 CLINICAL DATA:  53 y/o M; wound evaluation. 07/28/2018 calcaneal osteotomy and debridement. EXAM: RIGHT FOOT - 2 VIEW COMPARISON:  07/25/2018 right ankle radiographs. 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 Large dorsal calcaneal soft tissue post debridement, increased in size of soft tissue defect from prior CT of the ankle. Additionally, there is increased cortical lucency along the subjacent calcaneus. Vascular calcifications noted. IMPRESSION: Dorsal calcaneus soft tissue ulceration and subjacent bony cortical lucencies are increased in comparison with the prior CT of ankle which may represent progressive infection and/or interval postsurgical changes. Electronically Signed   By: Kristine Garbe M.D.   On: 09/05/2018 22:27   [place] Ekg Site Rite  Result Date: 09/09/2018 If Site Rite image not attached, placement could not be confirmed due to current cardiac rhythm.    CBC Recent Labs  Lab 09/05/18 1754 09/06/18 0411 09/07/18 0435 09/08/18 0325 09/09/18 0336  WBC 12.8* 15.7* 12.1* 12.5* 13.0*  HGB 7.7* 8.7* 7.4* 6.9* 8.7*  HCT 26.0* 29.2* 25.3* 23.4* 28.0*  PLT 327 390 329 302 347  MCV 86.4 86.1 86.6 85.7 84.3  MCH 25.6* 25.7* 25.3* 25.3* 26.2  MCHC 29.6* 29.8* 29.2* 29.5* 31.1  RDW 17.5* 17.6* 17.7* 17.8* 17.2*  LYMPHSABS 0.9  --   --   --   --   MONOABS 0.7  --   --   --   --   EOSABS 0.0  --   --   --   --   BASOSABS 0.0  --   --   --   --     Chemistries  Recent Labs  Lab 09/05/18 1754 09/06/18 0411 09/07/18 0435 09/08/18 0325 09/09/18 0336  NA 136 138 140 136 135  K 3.7 3.6 4.1 3.9 4.1  CL 103 105 106 101 99  CO2 20* 25 25 24 23   GLUCOSE 280* 175* 109* 130* 265*  BUN 60* 56* 53* 61* 66*  CREATININE 2.85* 2.59* 2.75* 2.78* 2.63*  CALCIUM 8.9 8.9 8.6* 8.3* 8.4*  MG  --   --   --   --  1.7  AST 15  --   --   --   --   ALT 9  --   --   --   --   ALKPHOS 99  --   --   --   --   BILITOT 0.5  --   --   --   --    ------------------------------------------------------------------------------------------------------------------ estimated creatinine clearance is 37.4 mL/min (A) (by  C-G formula based on SCr of 2.63 mg/dL (H)). ------------------------------------------------------------------------------------------------------------------ No results for input(s): HGBA1C in the last 72 hours. ------------------------------------------------------------------------------------------------------------------ No results for input(s): CHOL, HDL, LDLCALC, TRIG, CHOLHDL, LDLDIRECT in the last 72 hours. ------------------------------------------------------------------------------------------------------------------ No results for input(s): TSH, T4TOTAL, T3FREE, THYROIDAB in the last 72 hours.  Invalid input(s): FREET3 ------------------------------------------------------------------------------------------------------------------ Recent Labs    09/07/18 0435  VITAMINB12 327  FOLATE 7.9  FERRITIN 101  TIBC 204*  IRON 9*  RETICCTPCT 1.7    Coagulation profile No results for input(s): INR, PROTIME in the last 168 hours.  No results for input(s): DDIMER in the last 72 hours.  Cardiac Enzymes No results for input(s): CKMB, TROPONINI, MYOGLOBIN in the last 168 hours.  Invalid input(s): CK ------------------------------------------------------------------------------------------------------------------ Invalid  input(s): POCBNP    Assessment & Plan  Patient is 53 year old with recurrent foot infection  1.  Acute osteomyelitis of right foot: Continue vancomycin and cefazolin.  Plan for arteriogram later today 2.   HTN (hypertension) -blood pressure stable continue home dose antihypertensives 3. Anemia has some low iron with ferritin IV iron times once 4.   Diabetes ([place]) -sliding scale insulin 5.   CAD (coronary artery disease) -continue home meds 6.  Chronic systolic CHF (congestive heart failure) ([place]) -continue home meds 7. CKD (chronic kidney disease), stage IV ([place]) -at baseline, avoid nephrotoxins and monitor, if renal function worsens ask nephrology to  see      Code Status Orders  (From admission, onward)         Start     Ordered   09/06/18 0259  Full code  Continuous     09/06/18 0258        Code Status History    Date Active Date Inactive Code Status Order ID Comments User Context   07/25/2018 1717 08/02/2018 1919 Full Code [id number]  Dustin Flock, MD Inpatient   05/25/2018 0352 05/29/2018 2022 Full Code [id number]  Hillary Bow, MD ED   10/09/2016 1003 10/09/2016 1340 Full Code [id number]  Samara Deist, [place] Inpatient   08/22/2016 0042 08/27/2016 1649 Full Code [id number]  Lance Coon, MD Inpatient   07/18/2014 1818 07/20/2014 2002 Full Code [id number]  Leighton Parody, PA-C Inpatient           Consults podiatry  DVT Prophylaxis  Lovenox    Lab Results  Component Value Date   PLT 347 09/09/2018     Time Spent in minutes 35 minutes greater than 50% of time spent in care coordination and counseling patient regarding the condition and plan of care.   Dustin Flock M.D on 09/09/2018 at 10:35 AM  Between 7am to 6pm - Pager - [phone]  After 6pm go to [email or web address] - Proofreader  [employer]   Office  [phone]

## 2018-09-09 NOTE — Progress Notes (Signed)
[facility]  ROUNDING NOTE   Subjective:   [name] admitted to [facility] on 09/05/2018 for Wound infection [T14.8XXA, L08.9] Other acute osteomyelitis of right foot ([place]) [M86.171] Type 2 diabetes mellitus with other specified complication, unspecified whether long term insulin use ([place]) [E11.69]  Patient underwent resection of right heel osteomyelitis by Dr. Elvina Mattes on 5/7.   Angiogram scheduled for later today.   Objective:  Vital signs in last 24 hours:  Temp:  [97.1 F (36.2 C)-98 F (36.7 C)] 97.7 F (36.5 C) (05/08 0748) Pulse Rate:  [67-90] 80 (05/08 0748) Resp:  [9-24] 18 (05/08 0748) BP: (100-147)/(67-85) 130/74 (05/08 0748) SpO2:  [93 %-100 %] 100 % (05/08 0748)  Weight change:  Filed Weights   09/05/18 1752 09/06/18 0301  Weight: 98.4 kg 97.5 kg    Intake/Output: I/O last 3 completed shifts: In: 1403.1 [I.V.:687.2; Blood:416; IV XNATFTDDU:202] Out: 2505 [Urine:2500; Blood:5]   Intake/Output this shift:  Total I/O In: 339.1 [I.V.:186.9; IV Piggyback:152.2] Out: -   Physical Exam: General: NAD,   Head: Normocephalic, atraumatic. Moist oral mucosal membranes  Eyes: Anicteric, PERRL  Neck: Supple, trachea midline  Lungs:  Clear to auscultation  Heart: Regular rate and rhythm  Abdomen:  Soft, nontender,   Extremities:  bilateral feet with pressure boots, trace edema  Neurologic: Nonfocal, moving all four extremities  Skin: No lesions        Basic Metabolic Panel: Recent Labs  Lab 09/05/18 1754 09/06/18 0411 09/07/18 0435 09/08/18 0325 09/09/18 0336  NA 136 138 140 136 135  K 3.7 3.6 4.1 3.9 4.1  CL 103 105 106 101 99  CO2 20* 25 25 24 23   GLUCOSE 280* 175* 109* 130* 265*  BUN 60* 56* 53* 61* 66*  CREATININE 2.85* 2.59* 2.75* 2.78* 2.63*  CALCIUM 8.9 8.9 8.6* 8.3* 8.4*  MG  --   --   --   --  1.7    Liver Function Tests: Recent Labs  Lab 09/05/18 1754  AST 15  ALT 9  ALKPHOS 99  BILITOT 0.5  PROT 7.3  ALBUMIN  3.5   No results for input(s): LIPASE, AMYLASE in the last 168 hours. No results for input(s): AMMONIA in the last 168 hours.  CBC: Recent Labs  Lab 09/05/18 1754 09/06/18 0411 09/07/18 0435 09/08/18 0325 09/09/18 0336  WBC 12.8* 15.7* 12.1* 12.5* 13.0*  NEUTROABS 11.1*  --   --   --   --   HGB 7.7* 8.7* 7.4* 6.9* 8.7*  HCT 26.0* 29.2* 25.3* 23.4* 28.0*  MCV 86.4 86.1 86.6 85.7 84.3  PLT 327 390 329 302 347    Cardiac Enzymes: No results for input(s): CKTOTAL, CKMB, CKMBINDEX, TROPONINI in the last 168 hours.  BNP: Invalid input(s): POCBNP  CBG: Recent Labs  Lab 09/08/18 1204 09/08/18 1538 09/08/18 2128 09/09/18 0745 09/09/18 1158  GLUCAP 89 88 304* 224* 185*    Microbiology: Results for orders placed or performed during the hospital encounter of 09/05/18  SARS Coronavirus 2 (CEPHEID - Performed in [place] hospital lab), Hosp Order     Status: None   Collection Time: 09/05/18 11:49 PM  Result Value Ref Range Status   SARS Coronavirus 2 NEGATIVE NEGATIVE Final    Comment: (NOTE) If result is NEGATIVE SARS-CoV-2 target nucleic acids are NOT DETECTED. The SARS-CoV-2 RNA is generally detectable in upper and lower  respiratory specimens during the acute phase of infection. The lowest  concentration of SARS-CoV-2 viral copies this  assay can detect is 250  copies / mL. A negative result does not preclude SARS-CoV-2 infection  and should not be used as the sole basis for treatment or other  patient management decisions.  A negative result may occur with  improper specimen collection / handling, submission of specimen other  than nasopharyngeal swab, presence of viral mutation(s) within the  areas targeted by this assay, and inadequate number of viral copies  (<250 copies / mL). A negative result must be combined with clinical  observations, patient history, and epidemiological information. If result is POSITIVE SARS-CoV-2 target nucleic acids are DETECTED. The  SARS-CoV-2 RNA is generally detectable in upper and lower  respiratory specimens dur ing the acute phase of infection.  Positive  results are indicative of active infection with SARS-CoV-2.  Clinical  correlation with patient history and other diagnostic information is  necessary to determine patient infection status.  Positive results do  not rule out bacterial infection or co-infection with other viruses. If result is PRESUMPTIVE POSTIVE SARS-CoV-2 nucleic acids MAY BE PRESENT.   A presumptive positive result was obtained on the submitted specimen  and confirmed on repeat testing.  While 2019 novel coronavirus  (SARS-CoV-2) nucleic acids may be present in the submitted sample  additional confirmatory testing may be necessary for epidemiological  and / or clinical management purposes  to differentiate between  SARS-CoV-2 and other Sarbecovirus currently known to infect humans.  If clinically indicated additional testing with an alternate test  methodology [phone]) is advised. The SARS-CoV-2 RNA is generally  detectable in upper and lower respiratory sp ecimens during the acute  phase of infection. The expected result is Negative. Fact Sheet for Patients:  StrictlyIdeas.no Fact Sheet for Healthcare Providers: BankingDealers.co.za This test is not yet approved or cleared by the [place] FDA and has been authorized for detection and/or diagnosis of SARS-CoV-2 by FDA under an Emergency Use Authorization (EUA).  This EUA will remain in effect (meaning this test can be used) for the duration of the COVID-19 declaration under Section 564(b)(1) of the Act, 21 U.S.C. section 360bbb-3(b)(1), unless the authorization is terminated or revoked sooner. Performed at [facility], [address] [place], [place] [address]   MRSA PCR Screening     Status: Abnormal   Collection Time: 09/06/18  4:35 AM  Result Value Ref Range Status    MRSA by PCR POSITIVE (A) NEGATIVE Final    Comment:        The GeneXpert MRSA Assay (FDA approved for NASAL specimens only), is one component of a comprehensive MRSA colonization surveillance program. It is not intended to diagnose MRSA infection nor to guide or monitor treatment for MRSA infections. RESULT CALLED TO, READ BACK BY AND VERIFIED WITH: JENNIFER COLE @0812  ON 09/06/2018 BY FMW Performed at [facility], [address] [place], [place] [address]   Aerobic/Anaerobic Culture (surgical/deep wound)     Status: None (Preliminary result)   Collection Time: 09/08/18  2:25 PM  Result Value Ref Range Status   Specimen Description   Final    TISSUE ACHILLES TENDON SOFT TISSUE Performed at [facility], [address] [place], [name] [address]    Special Requests   Final    NONE Performed at [facility], [address] [place], [place] [address]    Gram Stain   Final    MODERATE WBC PRESENT,BOTH PMN AND MONONUCLEAR FEW GRAM POSITIVE COCCI    Culture   Final  TOO YOUNG TO READ Performed at [facility], [place] [address] [place], [place] [address]    Report Status PENDING  Incomplete    Coagulation Studies: No results for input(s): LABPROT, INR in the last 72 hours.  Urinalysis: No results for input(s): COLORURINE, LABSPEC, PHURINE, GLUCOSEU, HGBUR, BILIRUBINUR, KETONESUR, PROTEINUR, UROBILINOGEN, NITRITE, LEUKOCYTESUR in the last 72 hours.  Invalid input(s): APPERANCEUR    Imaging: [place] Ekg Site Rite  Result Date: 09/09/2018 If Site Rite image not attached, placement could not be confirmed due to current cardiac rhythm.    Medications:   . sodium chloride 0 mL/hr at 09/06/18 1459  . sodium chloride Stopped (09/09/18 0833)  . cefTRIAXone (ROCEPHIN)  IV Stopped (09/08/18 2344)  . vancomycin 750 mg (09/09/18 0240)   . amiodarone  200 mg Oral BID  . aspirin EC  81 mg Oral Daily  . carvedilol  3.125 mg Oral BID  .  gabapentin  300 mg Oral TID  . heparin  5,000 Units Subcutaneous Q8H  . insulin aspart  0-5 Units Subcutaneous QHS  . insulin aspart  0-9 Units Subcutaneous TID WC  . insulin glargine  20 Units Subcutaneous q morning - 10a  . multivitamin-lutein  1 capsule Oral Daily  . mupirocin ointment   Nasal BID  . predniSONE  5 mg Oral Q breakfast  . Ensure Max Protein  11 oz Oral TID BM  . torsemide  40 mg Oral q morning - 10a   And  . torsemide  20 mg Oral QPM   sodium chloride, acetaminophen **OR** acetaminophen, ondansetron **OR** ondansetron (ZOFRAN) IV, oxyCODONE  Assessment/ Plan:  Mr. Simuel Stebner. is a 53 y.o. black male  diabetes mellitus type II, hypertension,coronary artery disease, congestive heart failure, ICD, carotid stenosis, CVA, obstructive sleep apnea, peripheral vascular disease, gout,who was admitted to [facility] on5/08/2018 for Wound infection [T14.8XXA, L08.9] Other acute osteomyelitis of right foot ([place]) [M86.171] Type 2 diabetes mellitus with other specified complication, unspecified whether long term insulin use ([place]) [E11.69]  1. Chronic kidney disease stage IV with proteinuria:  Chronic kidney disease secondary to diabetic nephropathy - not currently on an ACE-I/ARB -will consider outpatient once serum creatinine is considered to be stable - Limit IV contrast exposure - Outpatient follow up with Nephrology.   2. Right heel osteomyelitis: arteriogram for today - Vanco and ceftriaxone - Appreciate vascular, ID and podiatry input. - Not a candidate for a long term PICC due to renal insufficiency. Prefer a power-line.   3. Hypertension: blood pressure at goal.  - carvedilol and torsemide.  4. Anemia of chronic kidney disease:Hemoglobin 8.7. Normocytic.  May need ESA therapy as outpatient.  Continue to monitor.  5. Diabetes mellitus type II with chronic kidney disease: insulin dependent. Hemoglobin A1c of 7.9%.  Complication of diabetic foot ulcer with  osteomyelitis.   LOS: 3 Minaal Struckman 5/8/20203:05 PM

## 2018-09-09 NOTE — TOC Initial Note (Signed)
Transition of Care (TOC) - Initial/Assessment Note    Patient Details  Name: Paul [name]. MRN: [id number] Date of Birth: 1966/04/27  Transition of Care [facility]) CM/SW Contact:    Annamaria Boots, [place] Phone Number: 09/09/2018, 11:21 AM  Clinical Narrative:   Patient is at moderate risk for readmission. CSW met with patient to discuss discharge plan. Patient reports that he lives with some friends. It appears that patient lives at a boarding home. Patient reports that he has a caregiver that comes to help him as needed. He also reports that he has a home health nurse for his wound through [facility]. Patient plans to return home when medically ready. Patient has a PCP already established and is able to obtain medications. CSW confirmed [facility] health [facility] with [facility]. CSW will continue to follow for discharge planning.                 Expected Discharge Plan: [place] Barriers to Discharge: Continued Medical Work up   Patient Goals and CMS Choice Patient states their goals for this hospitalization and ongoing recovery are:: Return home  CMS Medicare.gov Compare Post Acute Care list provided to:: Patient Choice offered to / list presented to : Patient  Expected Discharge Plan and Services Expected Discharge Plan: [place] Choice: [place] arrangements for the past 2 months: Single Family Home                               Date [place]: 09/09/18 Time HH Agency Contacted: 1120 Representative spoke with at [place]: Floydene Flock   Prior Living Arrangements/Services Living arrangements for the past 2 months: [place] with:: Self, Roommate Patient language and need for interpreter reviewed:: Yes Do you feel safe going back to the place where you live?: Yes      Need for Family Participation in Patient Care: Yes (Comment) Care giver support system in  place?: Yes (comment) Current home services: Home RN Criminal Activity/Legal Involvement Pertinent to Current Situation/Hospitalization: No - Comment as needed  Activities of Daily Living Home Assistive Devices/Equipment: Walker (specify type)(Front wheel) ADL Screening (condition at time of admission) Patient's cognitive ability adequate to safely complete daily activities?: Yes Is the patient deaf or have difficulty hearing?: No Does the patient have difficulty seeing, even when wearing glasses/contacts?: No Does the patient have difficulty concentrating, remembering, or making decisions?: No Patient able to express need for assistance with ADLs?: Yes Does the patient have difficulty dressing or bathing?: Yes Independently performs ADLs?: Yes (appropriate for developmental age) Does the patient have difficulty walking or climbing stairs?: Yes Weakness of Legs: Both Weakness of Arms/Hands: Both  Permission Sought/Granted Permission sought to share information with : Case Manager, Customer service manager, Family Supports Permission granted to share information with : Yes, Verbal Permission Granted              Emotional Assessment Appearance:: Appears stated age   Affect (typically observed): Appropriate, Accepting Orientation: : Oriented to Self, Oriented to Place, Oriented to  Time, Oriented to Situation Alcohol / Substance Use: Not Applicable Psych Involvement: No (comment)  Admission diagnosis:  Wound infection [T14.8XXA, L08.9] Other acute osteomyelitis of right foot ([place]) [M86.171] Type 2 diabetes mellitus with other specified complication, unspecified whether long term insulin use ([place]) [E11.69] Patient Active  Problem List   Diagnosis Date Noted  . Acute osteomyelitis of right ankle or foot ([place]) 09/06/2018  . Pressure ulcer of heel, right, unstageable ([place]) 09/06/2018  . Acute osteomyelitis of right foot ([place]) 07/25/2018  . Osteomyelitis ([place]) 07/25/2018  .  Anasarca 05/25/2018  . Pressure injury of skin 05/25/2018  . CKD (chronic kidney disease), stage IV ([place]) 12/15/2016  . Ulcerated, foot ([place]) 12/15/2016  . Gangrene of foot ([place]) 08/21/2016  . HTN (hypertension) 08/21/2016  . Diabetes ([place]) 08/21/2016  . CAD (coronary artery disease) 08/21/2016  . Chronic systolic CHF (congestive heart failure) ([place]) 08/21/2016  . Diabetic foot infection ([place]) 08/21/2016  . Heterotopic ossification of bone 07/12/2014   PCP:  Tracie Harrier, MD Pharmacy:   [facility] [address] [place] [address] Phone: [phone] Fax: [phone]     Social Determinants of Health (SDOH) Interventions    Readmission Risk Interventions Readmission Risk Prevention Plan 08/02/2018  Transportation Screening Complete  HRI or [place] Complete  Social Work Consult for [place] Planning/Counseling Complete  Palliative Care Screening Not Applicable  Medication Review Press photographer) Complete  Some recent data might be hidden

## 2018-09-09 NOTE — Progress Notes (Signed)
Date of Admission:  09/05/2018     ID: Paul Ala. is a 53 y.o. male  Principal Problem:   Acute osteomyelitis of right foot ([place]) Active Problems:   HTN (hypertension)   Diabetes ([place])   CAD (coronary artery disease)   Chronic systolic CHF (congestive heart failure) (HCC)   CKD (chronic kidney disease), stage IV (HCC)   Acute osteomyelitis of right ankle or foot (HCC)   Pressure ulcer of heel, right, unstageable ([place])  Medical history CAD s/p CABG Gangrene of foot HTN DM ASCVD Combined systolic and diastolic CHF/AICD CKD Gout Carotid artery disease ( proir LICA stent)   PSH Amputation all toes rt foot AICD CABG Cholecystectomy Carotid endarterectomy    Underwent on 09/08/18   1.  Excision of infected necrotic Achilles tendon right   2.  Excision of posterior calcaneus right  Subjective:   Medications:  . amiodarone  200 mg Oral BID  . aspirin EC  81 mg Oral Daily  . carvedilol  3.125 mg Oral BID  . gabapentin  300 mg Oral TID  . heparin  5,000 Units Subcutaneous Q8H  . insulin aspart  0-5 Units Subcutaneous QHS  . insulin aspart  0-9 Units Subcutaneous TID WC  . insulin glargine  20 Units Subcutaneous q morning - 10a  . multivitamin-lutein  1 capsule Oral Daily  . mupirocin ointment   Nasal BID  . predniSONE  5 mg Oral Q breakfast  . Ensure Max Protein  11 oz Oral TID BM  . torsemide  40 mg Oral q morning - 10a   And  . torsemide  20 mg Oral QPM    Objective: Vital signs in last 24 hours: Temp:  [97.1 F (36.2 C)-98.3 F (36.8 C)] 97.7 F (36.5 C) (05/08 0748) Pulse Rate:  [67-90] 80 (05/08 0748) Resp:  [9-24] 18 (05/08 0748) BP: (100-147)/(67-85) 130/74 (05/08 0748) SpO2:  [93 %-100 %] 100 % (05/08 0748) l. has gone for vascular procedure Lab Results Recent Labs    09/08/18 0325 09/09/18 0336  WBC 12.5* 13.0*  HGB 6.9* 8.7*  HCT 23.4* 28.0*  NA 136 135  K 3.9 4.1  CL 101 99  CO2 24 23  BUN 61* 66*  CREATININE 2.78* 2.63*    Liver Panel No results for input(s): PROT, ALBUMIN, AST, ALT, ALKPHOS, BILITOT, BILIDIR, IBILI in the last 72 hours. Sedimentation Rate No results for input(s): ESRSEDRATE in the last 72 hours. C-Reactive Protein No results for input(s): CRP in the last 72 hours.  Microbiology:  Studies/Results: Ct Ankle Right Wo Contrast  Result Date: 09/07/2018 CLINICAL DATA:  Chronic wound on the heel. EXAM: CT OF THE RIGHT ANKLE WITHOUT CONTRAST TECHNIQUE: Multidetector CT imaging of the right ankle was performed according to the standard protocol. Multiplanar CT image reconstructions were also generated. COMPARISON:  CT right foot 05/25/2018. Plain films right foot 09/05/2018. FINDINGS: Bones/Joint/Cartilage No bony destructive change or periosteal reaction is identified. No fracture or dislocation. Mid to distal transmetatarsal amputations again seen. Ligaments Suboptimally assessed by CT. Muscles and Tendons Since the prior CT, the patient has developed fluid containing a small amount of gas within and anterior to the distal Achilles tendon. Discrete measurement is difficult but fluid measures approximately 2.3 cm transverse by 1.3 cm AP by approximately 7 cm craniocaudal. The Achilles tendon appears completely torn 2 cm above its insertion on the calcaneus. Soft tissues There is subcutaneous edema about the foot. Subcutaneous edema is seen about the ankle  and foot. IMPRESSION: Large skin wound over the heel. Findings highly suspicious for septic Achilles tenosynovitis and likely disruption of the Achilles tendon are identified. If the patient is able, MRI with and without contrast would be better for evaluation of these findings. Negative for CT evidence of osteomyelitis. Status post mid to distal transmetatarsal amputations as soon on prior exams. Electronically Signed   By: Inge Rise M.D.   On: 09/07/2018 15:08   [place] Ekg Site Rite  Result Date: 09/09/2018 If Site Rite image not attached, placement  could not be confirmed due to current cardiac rhythm.    Assessment/Plan: 53 y.o.malewith a history ofDM, HTN, CAD,Diabetic foot infection with amputation of all toes on the rt,b/l heel ulcers B/l followed by Dr.Troxler was admitted on 3/23 with worsening infection.  Diabetic foot infection with b/l heel ulcers- underwent 1.  Excision of infected necrotic Achilles tendon right   2.  Excision of posterior calcaneus right On IV vanco and ceftriaxone- culture pending-final antibiotics will depend on the culture. will need IV for 4 weeks   CKD-  Anemia of chronic disease and also due to CKD  PAD- has gone for vascular procedure ? DM- ?on insulin  CAD?s/p CABG On AICD ? Discussed with dr.Troxler

## 2018-09-09 NOTE — Op Note (Signed)
OPERATIVE NOTE   PROCEDURE: 1. Placement of a right IJ Hickman catheter  PRE-OPERATIVE DIAGNOSIS: Osteomyelitis right calcaneous  POST-OPERATIVE DIAGNOSIS: same  SURGEON: Katha Cabal M.D.  ANESTHESIA: Conscious sedation was administered under my direct supervision by the interventional radiology RN. IV Versed plus fentanyl were utilized. Continuous ECG, pulse oximetry and blood pressure was monitored throughout the entire procedure. Conscious sedation was for a total of 20 minutes.  ESTIMATED BLOOD LOSS: Minimal   FINDING(S): 1.  Patent vein  SPECIMEN(S): None  INDICATIONS:   Paul [name]. is a 53 y.o. male who presents with worsening infection of his right foot.  He has been debrided by podiatry and now has documented osteomyelitis of the calcaneus.  He will require prolonged antibiotics.  He therefore requires appropriate intravenous access.  Initially a PICC line was planned but the patient would prefer a Hickman catheter.  Risks and benefits were reviewed all questions answered patient agrees to proceed with Hickman catheter placement..  DESCRIPTION: After obtaining full informed written consent, the patient was brought back to the special procedure suite and placed in the supine position. The patient's right neck and chest wall are prepped and draped in sterile fashion. Appropriate timeout was called.  Ultrasound is placed in a sterile sleeve, ultrasound is utilized to avoid vascular injury as well as secondary to lack of appropriate landmarks. The right internal jugular vein is identified. It is echolucent and homogeneous as well as easily compressible indicating patency. An image is recorded for the permanent record.  Access to the vein with a micropuncture needle is done under direct ultrasound visualization.  1% lidocaine is infiltrated into the soft tissue at the base of the neck as well as on the chest wall.  Under direct ultrasound visualization a micro-needle is  inserted into the vein followed by the micro-wire. Micro-sheath was then advanced and a J wire is inserted without difficulty under fluoroscopic guidance. A small counterincision was created at the wire insertion site. A small transverse incision is created on the chest wall.  The catheter is then pulled subcutaneously from the exit site to the neck counterincision.  It is then approximated on the chest wall for length and trimmed to 23 cm.  Dilator and peel-away sheath were then inserted over the wire and the wire is removed. Catheter is then advanced into the venous system without difficulty. Peel-away sheath was then removed.  Catheter positioned is verified under fluoroscopic guidance at the atrial caval junction.  The neck counterincision was closed with 4-0 Monocryl subcuticular and Dermabond as well.  The patient tolerated the procedure well and there were no immediate complications.  COMPLICATIONS: None  CONDITION: Unchanged  Katha Cabal M.D. [facility] Office: [phone]   09/09/2018, 5:21 PM

## 2018-09-09 NOTE — Op Note (Signed)
[facility] Percutaneous Study/Intervention Procedural Note   Date of Surgery: 09/09/2018  Surgeon:  Katha Cabal, MD.  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 Introduction catheter into right lower extremity 3rd order catheter placement  2. Contrast injection right lower extremity for distal runoff   3. Percutaneous transluminal angioplasty right popliteal artery  4. Star close closure left common femoral arteriotomy  Anesthesia: Conscious sedation was administered under my direct supervision by the interventional radiology RN. IV Versed plus fentanyl were utilized. Continuous ECG, pulse oximetry and blood pressure was monitored throughout the entire procedure.  Conscious sedation was for a total of 55 minutes.  Sheath: 6 French Raby left common femoral retrograde  Contrast: 110 cc  Fluoroscopy Time: 9.7 minutes  Indications: [name] presents with atherosclerotic occlusive disease bilateral lower extremities with known ulceration of the right heel and osteomyelitis of the calcaneus.  The risks and benefits are reviewed all questions answered patient agrees to proceed.  Procedure: Glenroy Crossen. is a 53 y.o. y.o. male who was identified and appropriate procedural time out was performed. The patient was then placed supine on the table and prepped and draped in the usual sterile fashion.   Ultrasound was placed in the sterile sleeve and the left groin was evaluated the left common femoral artery was echolucent and pulsatile indicating patency.  Image was recorded for the permanent record and under real-time visualization a microneedle was inserted into the common femoral artery microwire followed by a micro-sheath.  A J-wire was then  advanced through the micro-sheath and a  5 [place] sheath was then inserted over a J-wire. J-wire was then advanced and a 5 French pigtail catheter was positioned at the level of T12. AP projection of the aorta was then obtained. Pigtail catheter was repositioned to above the bifurcation and a LAO view of the pelvis was obtained.  Subsequently a pigtail catheter with the advantage wire was used to cross the aortic bifurcation the catheter wire were advanced down into the right distal external iliac artery. Oblique view of the femoral bifurcation was then obtained and subsequently the wire was reintroduced and the pigtail catheter negotiated into the SFA representing third order catheter placement. Distal runoff was then performed.  Diagnostic interpretation: The abdominal aorta is opacified with a bolus injection contrast.  There is calcifications noted but there are no hemodynamically significant stenoses identified.  The bilateral common external and internal iliac arteries are all widely patent.  No hemodynamically significant stenoses are noted in the common or external iliac arteries.  The right common femoral and profunda femoris are patent.  No hemodynamically significant stenoses are noted.  The SFA is patent.  At its origin there is a 30 to 40% narrowing this is smooth and does not appear to be hemodynamically significant.  The SFA at [facility] canal demonstrates mild to moderate irregularity again 30 to 40% diameter reductions are noted but no hemodynamically significant lesion is identified.  The above-knee and mid popliteal are patent.  The distal popliteal demonstrates a greater than 70% stenosis just before the origin of the anterior tibial.  Anterior tibial is patent all the way to the foot and fills the pedal arch.  Tibioperoneal trunk is patent previous angioplasty appears to have treated the lesion well the peroneal is patent down to the foot and collateralizes nicely across the ankle.  There  is  a moderate distal stenosis noted but I do not feel that treatment would be beneficial at this time given the robust anterior tibial.  Posterior tibial is occluded at its origin and remains occluded throughout its entirety including the bifurcation of the lateral and medial plantar vessels.  5000 units of heparin was then given and allowed to circulate and a 6 [place] Raby sheath was advanced up and over the bifurcation and positioned in the femoral artery  KMP  catheter and advantage wire were then negotiated down into the distal popliteal.  Distal runoff was then completed by hand injection through the catheter. The wire was then reintroduced and a 4 mm x 40 mm ultra score balloon was used to angioplasty the popliteal arteries. Inflations were to 12 atmospheres for 1 minutes.  Next a 4 mm x 80 mm Lutonix drug-eluting balloon was advanced across this area extending more proximally into the popliteal inflation was to 8 atm for 2 full minutes.  Follow-up imaging demonstrated patency with less than 10% residual stenosis. Distal runoff was then reassessed and found to be unchanged.  After review of these images the sheath is pulled into the left external iliac oblique of the common femoral is obtained and a Star close device deployed. There no immediate complications.   Findings:  The abdominal aorta is opacified with a bolus injection contrast.  There is calcifications noted but there are no hemodynamically significant stenoses identified.  The bilateral common external and internal iliac arteries are all widely patent.  No hemodynamically significant stenoses are noted in the common or external iliac arteries.  The right common femoral and profunda femoris are patent.  No hemodynamically significant stenoses are noted.  The SFA is patent.  At its origin there is a 30 to 40% narrowing this is smooth and does not appear to be hemodynamically significant.  The SFA at [facility] canal demonstrates mild to  moderate irregularity again 30 to 40% diameter reductions are noted but no hemodynamically significant lesion is identified.  The above-knee and mid popliteal are patent.  The distal popliteal demonstrates a greater than 70% stenosis just before the origin of the anterior tibial.  Anterior tibial is patent all the way to the foot and fills the pedal arch.  Tibioperoneal trunk is patent previous angioplasty appears to have treated the lesion well the peroneal is patent down to the foot and collateralizes nicely across the ankle.  There is a moderate distal stenosis noted but I do not feel that treatment would be beneficial at this time given the robust anterior tibial.  Posterior tibial is occluded at its origin and remains occluded throughout its entirety including the bifurcation of the lateral and medial plantar vessels.  Following angioplasty to 4 mm the distal popliteal is now patent with in-line flow and looks quite nice.     Summary: Successful recanalization right lower extremity for limb salvage    Disposition: Patient was taken to the recovery room in stable condition having tolerated the procedure well.  Ladarien Beeks, Dolores Lory 09/09/2018,5:26 PM

## 2018-09-09 NOTE — H&P (Signed)
[place] [facility] History & Physical Update  The patient was interviewed and re-examined.  The patient's previous History and Physical has been reviewed and is unchanged.  There is no change in the plan of care. We plan to proceed with the scheduled procedure.  Hortencia Pilar, MD  09/09/2018, 3:42 PM

## 2018-09-09 NOTE — Progress Notes (Signed)
Pharmacy Antibiotic Note  Paul [name]. is a 53 y.o. male admitted on 09/05/2018 with osteomyelitis of right foot.  Pharmacy has been consulted for Vancomycin dosing. Patient is currently on Vancomycin and CTX.  05/06 @ 0130 VR 15 mcg/mL appropriate random.  Plan: Continue vancomycin 750 mg IV Q 24 hrs. Goal AUC 400-550. Expected AUC: 490.6 SCr used: 2.63 Cssmin: 14.1  Will check a peak on the 4th dose and trough prior to 5th dose considering patient has a chronic foot ulcer and may be on vanc longer than 5 days and currently AKI on CKD (improving).     Height: 5\' 9"  (175.3 cm) Weight: 214 lb 15.2 oz (97.5 kg) IBW/kg (Calculated) : 70.7  Temp (24hrs), Avg:97.7 F (36.5 C), Min:97.1 F (36.2 C), Max:98.3 F (36.8 C)  Recent Labs  Lab 09/05/18 1754 09/06/18 0411 09/07/18 0023 09/07/18 0435 09/08/18 0325 09/09/18 0336  WBC 12.8* 15.7*  --  12.1* 12.5* 13.0*  CREATININE 2.85* 2.59*  --  2.75* 2.78* 2.63*  VANCORANDOM  --   --  15 25  --   --     Estimated Creatinine Clearance: 37.4 mL/min (A) (by C-G formula based on SCr of 2.63 mg/dL (H)).    Allergies  Allergen Reactions  . Other Other (See Comments)    Cardiac Problems. Pt states he tolerates Toradol. Due to kidney and heart problems per pt  . Ibuprofen Other (See Comments)    Heart problems  . Baclofen Other (See Comments)  . Metformin Diarrhea  . Nsaids     Due to kidney and heart problems per pt    Thank you for allowing pharmacy to be a part of this patient's care.  Kristeen Miss, PharmD Clinical Pharmacist 09/09/2018

## 2018-09-09 NOTE — Progress Notes (Signed)
Pt back from angiogram. L groin site clean dry intact. No sign of hematoma. Pt in no acute distress. VSS. Will continue to monitor.

## 2018-09-10 ENCOUNTER — Encounter: Payer: Self-pay | Admitting: Vascular Surgery

## 2018-09-10 LAB — CBC
HCT: 26 % — ABNORMAL LOW (ref 39.0–52.0)
Hemoglobin: 8.1 g/dL — ABNORMAL LOW (ref 13.0–17.0)
MCH: 26.4 pg (ref 26.0–34.0)
MCHC: 31.2 g/dL (ref 30.0–36.0)
MCV: 84.7 fL (ref 80.0–100.0)
Platelets: 350 10*3/uL (ref 150–400)
RBC: 3.07 MIL/uL — ABNORMAL LOW (ref 4.22–5.81)
RDW: 17.5 % — ABNORMAL HIGH (ref 11.5–15.5)
WBC: 13.7 10*3/uL — ABNORMAL HIGH (ref 4.0–10.5)
nRBC: 0 % (ref 0.0–0.2)

## 2018-09-10 LAB — RENAL FUNCTION PANEL
Albumin: 2.9 g/dL — ABNORMAL LOW (ref 3.5–5.0)
Anion gap: 9 (ref 5–15)
BUN: 60 mg/dL — ABNORMAL HIGH (ref 6–20)
CO2: 26 mmol/L (ref 22–32)
Calcium: 8.4 mg/dL — ABNORMAL LOW (ref 8.9–10.3)
Chloride: 101 mmol/L (ref 98–111)
Creatinine, Ser: 2.35 mg/dL — ABNORMAL HIGH (ref 0.61–1.24)
GFR calc Af Amer: 35 mL/min — ABNORMAL LOW (ref >60)
GFR calc non Af Amer: 30 mL/min — ABNORMAL LOW (ref >60)
Glucose, Bld: 273 mg/dL — ABNORMAL HIGH (ref 70–99)
Phosphorus: 4 mg/dL (ref 2.5–4.6)
Potassium: 4 mmol/L (ref 3.5–5.1)
Sodium: 136 mmol/L (ref 135–145)

## 2018-09-10 LAB — VANCOMYCIN, PEAK: Vancomycin Pk: 36 ug/mL (ref 30–40)

## 2018-09-10 LAB — TYPE AND SCREEN
ABO/RH(D): AB POS
Antibody Screen: NEGATIVE
Unit division: 0

## 2018-09-10 LAB — GLUCOSE, CAPILLARY
Glucose-Capillary: 214 mg/dL — ABNORMAL HIGH (ref 70–99)
Glucose-Capillary: 215 mg/dL — ABNORMAL HIGH (ref 70–99)
Glucose-Capillary: 206 mg/dL — ABNORMAL HIGH (ref 70–99)
Glucose-Capillary: 195 mg/dL — ABNORMAL HIGH (ref 70–99)

## 2018-09-10 LAB — BPAM RBC
Blood Product Expiration Date: 202005092359
ISSUE DATE / TIME: 202005071053
Unit Type and Rh: 600

## 2018-09-10 NOTE — Progress Notes (Signed)
[facility] Pod[facility]                                                      Patient Demographics  Paul [name], is a 53 y.o. male   MRN: [id number]   DOB - 05-26-1965  Admit Date - 09/05/2018    Outpatient Primary MD for the patient is Tracie Harrier, MD   Past Medical History:  Diagnosis Date  . Anemia   . Cardiac defibrillator in place    a. Biotronik LUmax 540 DRT, (ser # [id number]).  . Carotid arterial disease ([place])    a. s/p prior LICA stenting;  b. 05/256 Carotid U/S: 40-59% bilat ICA stenosis. Patent LICA stent.  . CHF (congestive heart failure) ([place])   . CKD (chronic kidney disease), stage III ([place])   . Coronary artery disease    a. 2010 s/p CABG x 3.  . DDD (degenerative disc disease), lumbosacral    L5-S1  . Diabetes ([place])    Lantus at bedtime  . Gangrene of toe of right foot ([name])   . Gout of left hand 10/06/2016  . HFrEF (heart failure with reduced ejection fraction) ([place])    a. 07/2016 Echo: EF 25-30%, diff HK, mild MR, mildly dil LA, mod reduced RV fxn, PASP 55mmHg.  Marland Kitchen Hypertension    takes Coreg daily  . Ischemic cardiomyopathy    a. 07/2016 Echo: EF 25-30%, diff HK.  . Myocardial infarction ([place]) 2009  . Peripheral vascular disease ([place])   . Sleep apnea    sleep study yr ago-unable to afford cpap  . Stroke [facility]) 09   no weakness      Past Surgical History:  Procedure Laterality Date  . AMPUTATION TOE Right 08/22/2016   Procedure: AMPUTATION TOE;  Surgeon: Samara Deist, DPM;  Location: [facility] ORS;  Service: Podiatry;  Laterality: Right;  . AMPUTATION TOE Right 10/09/2016   Procedure: AMPUTATION TOE-RIGHT 2ND MPJ;  Surgeon: Samara Deist, DPM;  Location: [facility] ORS;  Service: Podiatry;  Laterality: Right;  . APLIGRAFT PLACEMENT Right 10/09/2016   Procedure: APLIGRAFT PLACEMENT;  Surgeon: Samara Deist, DPM;  Location: [facility] ORS;  Service:  Podiatry;  Laterality: Right;  . CALCANEAL OSTEOTOMY Bilateral 07/28/2018   Procedure: RIGHT CALCANECTOMY BILATERAL DEBRIDEMENT OF ULCERS ON HEELS;  Surgeon: Albertine Patricia, DPM;  Location: [facility] ORS;  Service: Podiatry;  Laterality: Bilateral;  . CARDIAC DEFIBRILLATOR PLACEMENT  2011  . CAROTID ENDARTERECTOMY Left   . CHOLECYSTECTOMY  2010  . CORONARY ARTERY BYPASS GRAFT  2010   CABG x 3   . GRAFT APPLICATION Right 09/28/7822   Procedure: FULL THICKNESS SKIN GRAFT-RIGHT FOOT;  Surgeon: Algernon Huxley, MD;  Location: [facility] ORS;  Service: Vascular;  Laterality: Right;  . HIP SURGERY Right 1994  . INCISION AND DRAINAGE Right 09/08/2018   Procedure: INCISION AND DRAINAGE - [place] OF DEFECTIVE SKIN, SOFT TISSUE AND BONE;  Surgeon: Albertine Patricia, DPM;  Location: [facility] ORS;  Service: Podiatry;  Laterality: Right;  . INCISION AND DRAINAGE OF WOUND Right 08/22/2016   Procedure: IRRIGATION AND DEBRIDEMENT WOUND and wound vac placement;  Surgeon: Samara Deist, DPM;  Location: [facility] ORS;  Service: Podiatry;  Laterality: Right;  . LOWER EXTREMITY ANGIOGRAPHY Right 08/24/2016   Procedure: Lower Extremity Angiography;  Surgeon: Algernon Huxley, MD;  Location: [place] CV LAB;  Service:  Cardiovascular;  Laterality: Right;  . LOWER EXTREMITY ANGIOGRAPHY Right 07/27/2018   Procedure: RIGHT Lower Extremity Angiography;  Surgeon: Algernon Huxley, MD;  Location: [place] CV LAB;  Service: Cardiovascular;  Laterality: Right;  . MASS EXCISION Right 07/18/2014   Procedure: EXCISION HETEROTOPIC BONE RIGHT HIP;  Surgeon: Frederik Pear, MD;  Location: [place];  Service: Orthopedics;  Laterality: Right;  . Open Heart Surgery  2010   x 3  . WOUND DEBRIDEMENT Right 10/09/2016   Procedure: DEBRIDEMENT WOUND;  Surgeon: Samara Deist, DPM;  Location: [facility] ORS;  Service: Podiatry;  Laterality: Right;    in for   Chief Complaint  Patient presents with  . Wound Infection     HPI  Paul [name]  is a 53 y.o. male, 2 days  status post Achilles tendon resection due to necrosis and infection as well as resection of posterior calcaneus for same.  He is doing well has no pain at this point and seems to be progressing nicely.  States he did walk onto the bathroom the day of surgery but has not been on it since.                                                                                                 Anti-infectives (From admission, onward)   Start     Dose/Rate Route Frequency Ordered Stop   09/09/18 1630  ceFAZolin (ANCEF) 2,000 mg in dextrose 5 % 100 mL IVPB     2,000 mg 240 mL/hr over 30 Minutes Intravenous  Once 09/09/18 1626 09/09/18 1658   09/08/18 2300  ceFAZolin (ANCEF) IVPB 1 g/50 mL premix     1 g 100 mL/hr over 30 Minutes Intravenous  Once 09/08/18 2257 09/09/18 0005   09/08/18 1431  vancomycin (VANCOCIN) powder  Status:  Discontinued       As needed 09/08/18 1431 09/08/18 1534   09/08/18 1430  gentamicin (GARAMYCIN) injection  Status:  Discontinued       As needed 09/08/18 1431 09/08/18 1534   09/07/18 0243  vancomycin (VANCOCIN) IVPB 750 mg/150 ml premix     750 mg 150 mL/hr over 60 Minutes Intravenous Every 24 hours 09/07/18 0244     09/07/18 0130  vancomycin (VANCOCIN) IVPB 750 mg/150 ml premix  Status:  Discontinued     750 mg 150 mL/hr over 60 Minutes Intravenous Every 24 hours 09/06/18 0353 09/07/18 0244   09/06/18 2200  cefTRIAXone (ROCEPHIN) 2 g in sodium chloride 0.9 % 100 mL IVPB     2 g 200 mL/hr over 30 Minutes Intravenous Every 24 hours 09/06/18 2152     09/06/18 0400  piperacillin-tazobactam (ZOSYN) IVPB 3.375 g  Status:  Discontinued     3.375 g 12.5 mL/hr over 240 Minutes Intravenous Every 8 hours 09/06/18 0353 09/06/18 1907   09/05/18 2345  vancomycin (VANCOCIN) 2,000 mg in sodium chloride 0.9 % 500 mL IVPB     2,000 mg 250 mL/hr over 120 Minutes Intravenous  Once 09/05/18 2338 09/06/18 0329   09/05/18 2330  vancomycin (VANCOCIN) IVPB 1000 mg/200 mL premix   Status:  Discontinued     1,000 mg 200 mL/hr over 60 Minutes Intravenous  Once 09/05/18 2316 09/05/18 2338   09/05/18 2330  cefTRIAXone (ROCEPHIN) 2 g in sodium chloride 0.9 % 100 mL IVPB     2 g 200 mL/hr over 30 Minutes Intravenous  Once 09/05/18 2316 09/06/18 0129      Scheduled Meds: . amiodarone  200 mg Oral BID  . aspirin EC  81 mg Oral Daily  . carvedilol  3.125 mg Oral BID  . clopidogrel  75 mg Oral Q breakfast  . gabapentin  300 mg Oral TID  . heparin  5,000 Units Subcutaneous Q8H  . insulin aspart  0-5 Units Subcutaneous QHS  . insulin aspart  0-9 Units Subcutaneous TID WC  . insulin glargine  20 Units Subcutaneous q morning - 10a  . multivitamin-lutein  1 capsule Oral Daily  . mupirocin ointment   Nasal BID  . predniSONE  5 mg Oral Q breakfast  . Ensure Max Protein  11 oz Oral TID BM  . sodium chloride flush  3 mL Intravenous Q12H  . torsemide  40 mg Oral q morning - 10a   And  . torsemide  20 mg Oral QPM   Continuous Infusions: . sodium chloride 0 mL/hr at 09/06/18 1459  . sodium chloride 75 mL/hr at 09/10/18 0751  . sodium chloride    . cefTRIAXone (ROCEPHIN)  IV Stopped (09/09/18 2252)  . vancomycin Stopped (09/10/18 0422)   PRN Meds:.sodium chloride, sodium chloride, acetaminophen **OR** acetaminophen, morphine injection, ondansetron **OR** ondansetron (ZOFRAN) IV, ondansetron (ZOFRAN) IV, ondansetron (ZOFRAN) IV, oxyCODONE, sodium chloride flush  Allergies  Allergen Reactions  . Other Other (See Comments)    Cardiac Problems. Pt states he tolerates Toradol. Due to kidney and heart problems per pt  . Ibuprofen Other (See Comments)    Heart problems  . Baclofen Other (See Comments)  . Metformin Diarrhea  . Nsaids     Due to kidney and heart problems per pt    Physical Exam  Vitals  Blood pressure 135/87, pulse 77, temperature 97.6 F (36.4 C), temperature source Oral, resp. rate 19, height 5\' 9"  (1.753 m), weight 95.7 kg, SpO2 93 %.  Lower  Extremity exam: Dressings to both wounds are changed today.  Left heel is stable with no worsening ulceration good granular base.  The right heel looks fairly good at this timeframe.  The incision margin looks to be fairly well aligned the Achilles is no problem down at the area where the heel ulceration was is showing some signs of stabilization the incision margins basically intact still small area of opening that I cannot get closure over but the foot appears to be stable and no evidence of infection or heavy drainage.   Data Review  CBC Recent Labs  Lab 09/05/18 1754 09/06/18 0411 09/07/18 0435 09/08/18 0325 09/09/18 0336 09/10/18 0448  WBC 12.8* 15.7* 12.1* 12.5* 13.0* 13.7*  HGB 7.7* 8.7* 7.4* 6.9* 8.7* 8.1*  HCT 26.0* 29.2* 25.3* 23.4* 28.0* 26.0*  PLT 327 390 329 302 347 350  MCV 86.4 86.1 86.6 85.7 84.3 84.7  MCH 25.6* 25.7* 25.3* 25.3* 26.2 26.4  MCHC 29.6* 29.8* 29.2* 29.5* 31.1 31.2  RDW 17.5* 17.6* 17.7* 17.8* 17.2* 17.5*  LYMPHSABS 0.9  --   --   --   --   --   MONOABS 0.7  --   --   --   --   --   EOSABS 0.0  --   --   --   --   --  BASOSABS 0.0  --   --   --   --   --    ------------------------------------------------------------------------------------------------------------------  Chemistries  Recent Labs  Lab 09/05/18 1754 09/06/18 0411 09/07/18 0435 09/08/18 0325 09/09/18 0336 09/10/18 0449  NA 136 138 140 136 135 136  K 3.7 3.6 4.1 3.9 4.1 4.0  CL 103 105 106 101 99 101  CO2 20* 25 25 24 23 26   GLUCOSE 280* 175* 109* 130* 265* 273*  BUN 60* 56* 53* 61* 66* 60*  CREATININE 2.85* 2.59* 2.75* 2.78* 2.63* 2.35*  CALCIUM 8.9 8.9 8.6* 8.3* 8.4* 8.4*  MG  --   --   --   --  1.7  --   AST 15  --   --   --   --   --   ALT 9  --   --   --   --   --   ALKPHOS 99  --   --   --   --   --   BILITOT 0.5  --   --   --   --   --    Assessment & Plan: Change dressings to both areas today.  He is in the heel protector boots as I requested.  Home physical  therapy to work with him to be able to transfer from bed to a bedside toilet and to a bedside chair nonweightbearing on the right foot at this timeframe.  He can bear full weight on the left.  We will follow him tomorrow.  Principal Problem:   Acute osteomyelitis of right foot (HCC) Active Problems:   HTN (hypertension)   Diabetes ([place])   CAD (coronary artery disease)   Chronic systolic CHF (congestive heart failure) (HCC)   CKD (chronic kidney disease), stage IV (HCC)   Acute osteomyelitis of right ankle or foot (HCC)   Pressure ulcer of heel, right, unstageable ([place])   Family Communication: Plan discussed with patient  Albertine Patricia M.D on 09/10/2018 at 11:15 AM  Thank you for the consult, we will follow the patient with you in the Hospital.

## 2018-09-10 NOTE — Progress Notes (Signed)
[facility]  ROUNDING NOTE   Subjective:   Angiogram and angioplasty of right popliteal artery yesterday by Dr. Delana Meyer  NS at 58mL/hr   Patient is concerned that his diet was changed. He is requesting a diabetic diet.   Objective:  Vital signs in last 24 hours:  Temp:  [97.6 F (36.4 C)-98.6 F (37 C)] 97.6 F (36.4 C) (05/09 0328) Pulse Rate:  [70-84] 77 (05/09 0328) Resp:  [9-21] 19 (05/08 2023) BP: (112-156)/(68-87) 135/87 (05/09 0328) SpO2:  [93 %-100 %] 93 % (05/09 0328) Weight:  [95.7 kg] 95.7 kg (05/08 1520)  Weight change:  Filed Weights   09/05/18 1752 09/06/18 0301 09/09/18 1520  Weight: 98.4 kg 97.5 kg 95.7 kg    Intake/Output: I/O last 3 completed shifts: In: 996.9 [I.V.:544.7; IV Piggyback:452.1] Out: 2200 [OINOM:7672]   Intake/Output this shift:  Total I/O In: 737.7 [I.V.:487.7; IV Piggyback:250] Out: 300 [Urine:300]  Physical Exam: General: NAD,   Head: Normocephalic, atraumatic. Moist oral mucosal membranes  Eyes: Anicteric, PERRL  Neck: Supple, trachea midline  Lungs:  Clear to auscultation  Heart: Regular rate and rhythm  Abdomen:  Soft, nontender,   Extremities:  bilateral feet with pressure boots, trace edema  Neurologic: Nonfocal, moving all four extremities  Skin: No lesions        Basic Metabolic Panel: Recent Labs  Lab 09/06/18 0411 09/07/18 0435 09/08/18 0325 09/09/18 0336 09/10/18 0449  NA 138 140 136 135 136  K 3.6 4.1 3.9 4.1 4.0  CL 105 106 101 99 101  CO2 25 25 24 23 26   GLUCOSE 175* 109* 130* 265* 273*  BUN 56* 53* 61* 66* 60*  CREATININE 2.59* 2.75* 2.78* 2.63* 2.35*  CALCIUM 8.9 8.6* 8.3* 8.4* 8.4*  MG  --   --   --  1.7  --   PHOS  --   --   --   --  4.0    Liver Function Tests: Recent Labs  Lab 09/05/18 1754 09/10/18 0449  AST 15  --   ALT 9  --   ALKPHOS 99  --   BILITOT 0.5  --   PROT 7.3  --   ALBUMIN 3.5 2.9*   No results for input(s): LIPASE, AMYLASE in the last 168 hours. No  results for input(s): AMMONIA in the last 168 hours.  CBC: Recent Labs  Lab 09/05/18 1754 09/06/18 0411 09/07/18 0435 09/08/18 0325 09/09/18 0336 09/10/18 0448  WBC 12.8* 15.7* 12.1* 12.5* 13.0* 13.7*  NEUTROABS 11.1*  --   --   --   --   --   HGB 7.7* 8.7* 7.4* 6.9* 8.7* 8.1*  HCT 26.0* 29.2* 25.3* 23.4* 28.0* 26.0*  MCV 86.4 86.1 86.6 85.7 84.3 84.7  PLT 327 390 329 302 347 350    Cardiac Enzymes: No results for input(s): CKTOTAL, CKMB, CKMBINDEX, TROPONINI in the last 168 hours.  BNP: Invalid input(s): POCBNP  CBG: Recent Labs  Lab 09/09/18 0745 09/09/18 1158 09/09/18 1741 09/09/18 2122 09/10/18 0757  GLUCAP 224* 185* 163* 208* 214*    Microbiology: Results for orders placed or performed during the hospital encounter of 09/05/18  SARS Coronavirus 2 (CEPHEID - Performed in [facility] hospital lab), Hosp Order     Status: None   Collection Time: 09/05/18 11:49 PM  Result Value Ref Range Status   SARS Coronavirus 2 NEGATIVE NEGATIVE Final    Comment: (NOTE) If result is NEGATIVE SARS-CoV-2 target nucleic acids are NOT DETECTED. The SARS-CoV-2 RNA  is generally detectable in upper and lower  respiratory specimens during the acute phase of infection. The lowest  concentration of SARS-CoV-2 viral copies this assay can detect is 250  copies / mL. A negative result does not preclude SARS-CoV-2 infection  and should not be used as the sole basis for treatment or other  patient management decisions.  A negative result may occur with  improper specimen collection / handling, submission of specimen other  than nasopharyngeal swab, presence of viral mutation(s) within the  areas targeted by this assay, and inadequate number of viral copies  (<250 copies / mL). A negative result must be combined with clinical  observations, patient history, and epidemiological information. If result is POSITIVE SARS-CoV-2 target nucleic acids are DETECTED. The SARS-CoV-2 RNA is  generally detectable in upper and lower  respiratory specimens dur ing the acute phase of infection.  Positive  results are indicative of active infection with SARS-CoV-2.  Clinical  correlation with patient history and other diagnostic information is  necessary to determine patient infection status.  Positive results do  not rule out bacterial infection or co-infection with other viruses. If result is PRESUMPTIVE POSTIVE SARS-CoV-2 nucleic acids MAY BE PRESENT.   A presumptive positive result was obtained on the submitted specimen  and confirmed on repeat testing.  While 2019 novel coronavirus  (SARS-CoV-2) nucleic acids may be present in the submitted sample  additional confirmatory testing may be necessary for epidemiological  and / or clinical management purposes  to differentiate between  SARS-CoV-2 and other Sarbecovirus currently known to infect humans.  If clinically indicated additional testing with an alternate test  methodology [phone]) is advised. The SARS-CoV-2 RNA is generally  detectable in upper and lower respiratory sp ecimens during the acute  phase of infection. The expected result is Negative. Fact Sheet for Patients:  StrictlyIdeas.no Fact Sheet for Healthcare Providers: BankingDealers.co.za This test is not yet approved or cleared by the [place] FDA and has been authorized for detection and/or diagnosis of SARS-CoV-2 by FDA under an Emergency Use Authorization (EUA).  This EUA will remain in effect (meaning this test can be used) for the duration of the COVID-19 declaration under Section 564(b)(1) of the Act, 21 U.S.C. section 360bbb-3(b)(1), unless the authorization is terminated or revoked sooner. Performed at [facility], [address] [place], [place] [address]   MRSA PCR Screening     Status: Abnormal   Collection Time: 09/06/18  4:35 AM  Result Value Ref Range Status   MRSA by PCR  POSITIVE (A) NEGATIVE Final    Comment:        The GeneXpert MRSA Assay (FDA approved for NASAL specimens only), is one component of a comprehensive MRSA colonization surveillance program. It is not intended to diagnose MRSA infection nor to guide or monitor treatment for MRSA infections. RESULT CALLED TO, READ BACK BY AND VERIFIED WITH: JENNIFER COLE @0812  ON 09/06/2018 BY FMW Performed at [facility], [address] [place], [place] [address]   Aerobic/Anaerobic Culture (surgical/deep wound)     Status: None (Preliminary result)   Collection Time: 09/08/18  2:25 PM  Result Value Ref Range Status   Specimen Description   Final    TISSUE ACHILLES TENDON SOFT TISSUE Performed at [facility], [address] [place], [place] [address]    Special Requests   Final    NONE Performed at [facility], [address], [place], [place] [address]    Gram Stain  Final    MODERATE WBC PRESENT,BOTH PMN AND MONONUCLEAR FEW GRAM POSITIVE COCCI    Culture   Final    TOO YOUNG TO READ Performed at [facility], [address] [place], [place] [address]    Report Status PENDING  Incomplete    Coagulation Studies: No results for input(s): LABPROT, INR in the last 72 hours.  Urinalysis: No results for input(s): COLORURINE, LABSPEC, PHURINE, GLUCOSEU, HGBUR, BILIRUBINUR, KETONESUR, PROTEINUR, UROBILINOGEN, NITRITE, LEUKOCYTESUR in the last 72 hours.  Invalid input(s): APPERANCEUR    Imaging: [place] Ekg Site Rite  Result Date: 09/09/2018 If Site Rite image not attached, placement could not be confirmed due to current cardiac rhythm.    Medications:   . sodium chloride 0 mL/hr at 09/06/18 1459  . sodium chloride 75 mL/hr at 09/10/18 0751  . sodium chloride    . cefTRIAXone (ROCEPHIN)  IV Stopped (09/09/18 2252)  . vancomycin Stopped (09/10/18 0422)   . amiodarone  200 mg Oral BID  . aspirin EC  81 mg Oral Daily  . carvedilol  3.125 mg Oral  BID  . clopidogrel  75 mg Oral Q breakfast  . gabapentin  300 mg Oral TID  . heparin  5,000 Units Subcutaneous Q8H  . insulin aspart  0-5 Units Subcutaneous QHS  . insulin aspart  0-9 Units Subcutaneous TID WC  . insulin glargine  20 Units Subcutaneous q morning - 10a  . multivitamin-lutein  1 capsule Oral Daily  . mupirocin ointment   Nasal BID  . predniSONE  5 mg Oral Q breakfast  . Ensure Max Protein  11 oz Oral TID BM  . sodium chloride flush  3 mL Intravenous Q12H  . torsemide  40 mg Oral q morning - 10a   And  . torsemide  20 mg Oral QPM   sodium chloride, sodium chloride, acetaminophen **OR** acetaminophen, morphine injection, ondansetron **OR** ondansetron (ZOFRAN) IV, ondansetron (ZOFRAN) IV, ondansetron (ZOFRAN) IV, oxyCODONE, sodium chloride flush  Assessment/ Plan:  [name] is a 53 y.o. black male  diabetes mellitus type II, hypertension,coronary artery disease, congestive heart failure, ICD, carotid stenosis, CVA, obstructive sleep apnea, peripheral vascular disease, gout,who was [facility] to [facility] on5/08/2018 for Wound infection [T14.8XXA, L08.9] Other acute osteomyelitis of right foot ([place]) [M86.171] Type 2 diabetes mellitus with other specified complication, unspecified whether long term insulin use ([place]) [E11.69]  1. Chronic kidney disease stage IV with proteinuria:  Chronic kidney disease secondary to diabetic nephropathy - not currently on an ACE-I/ARB -will consider outpatient once serum creatinine is considered to be stable - Continue IV fluids for today.  - Outpatient follow up with Nephrology.   2. Right heel osteomyelitis: status post debridement on 5/7 by Dr. Elvina Mattes and Angiogram on 5/8 by Dr. Delana Meyer - Vanco and ceftriaxone - Appreciate vascular, ID and podiatry input. - Not a candidate for a long term PICC due to renal insufficiency.    3. Hypertension: blood pressure at goal.  - carvedilol and torsemide.  4. Anemia of chronic kidney  disease:Hemoglobin 8.1. Normocytic.  May need ESA therapy as outpatient.  Continue to monitor.  5. Diabetes mellitus type II with chronic kidney disease: insulin dependent. Hemoglobin A1c of 7.9%.  Complication of diabetic foot ulcer with osteomyelitis. - Change to diabetic diet.    LOS: [place] 5/9/202010:36 AM

## 2018-09-10 NOTE — Plan of Care (Signed)
Pt can be somewhat irritable at times regarding his medications, otherwise pt is improving. Pt refuses Heparin and Ensure. Convinced hospital needs to cover his glucose levels during the night and not during the day. Pt educated regarding eating/activity and insulin. Pdowless,rn

## 2018-09-10 NOTE — Plan of Care (Signed)
Patient is pleasant and talkative. Patient talked about how much he want surgery on his neck to deal with that pain, but just can't stay out of the hospital for 90 days to be able to get it. Patient stated his blood sugars are good, that his A1c is down from 11 something. Patient report a history of multiple stools in a day stated he has even woken up to find himself laying in feces. Stated it had improved some since losing weight.

## 2018-09-10 NOTE — Progress Notes (Addendum)
[place] at [facility]                                                                                                                                                                                  Patient Demographics   Paul [name], is a 53 y.o. male, DOB - 10/07/1965, SEG:[id number]  Admit date - 09/05/2018   Admitting Physician Lance Coon, MD  Outpatient Primary MD for the patient is Tracie Harrier, MD   LOS - 4  Subjective: States he is doing well today.  He denies any pain in his lower extremities.  Underwent angioplasty yesterday.  No concerns this morning.  Review of Systems:   CONSTITUTIONAL: No documented fever. No fatigue, weakness. No weight gain, no weight loss.  EYES: No blurry or double vision.  ENT: No tinnitus. No postnasal drip. No redness of the oropharynx.  RESPIRATORY: No cough, no wheeze, no hemoptysis. No dyspnea.  CARDIOVASCULAR: No chest pain. No orthopnea. No palpitations. No syncope.  GASTROINTESTINAL: No nausea, no vomiting or diarrhea. No abdominal pain. No melena or hematochezia.  GENITOURINARY: No dysuria or hematuria.  ENDOCRINE: No polyuria or nocturia. No heat or cold intolerance.  HEMATOLOGY: No anemia. No bruising. No bleeding.  INTEGUMENTARY: Positive drainage from the foot MUSCULOSKELETAL: No arthritis. No swelling. No gout.  Positive neck pain NEUROLOGIC: No numbness, tingling, or ataxia. No seizure-type activity.  PSYCHIATRIC: No anxiety. No insomnia. No ADD.    Vitals:   Vitals:   09/09/18 1808 09/09/18 1827 09/09/18 2023 09/10/18 0328  BP: (!) 145/81 (!) 156/84 138/82 135/87  Pulse: 80 82 79 77  Resp: 20 12 19    Temp:  97.9 F (36.6 C) 98.2 F (36.8 C) 97.6 F (36.4 C)  TempSrc:  Oral Oral Oral  SpO2: 96% 100% 100% 93%  Weight:      Height:        Wt Readings from Last 3 Encounters:  09/09/18 95.7 kg  07/25/18 94.8 kg  06/08/18 100.8 kg     Intake/Output Summary (Last 24 hours) at 09/10/2018  1054 Last data filed at 09/10/2018 0945 Gross per 24 hour  Intake 886.89 ml  Output 2300 ml  Net -1413.11 ml    Physical Exam:   GENERAL: Pleasant-appearing in no apparent distress.  HEENT: Atraumatic, normocephalic. Extraocular muscles are intact. Pupils equal and reactive to light. Sclerae anicteric. No conjunctival injection. No oro-pharyngeal erythema.  NECK: Supple. There is no jugular venous distention. No bruits, no lymphadenopathy, no thyromegaly. +Right IJ catheter in place HEART: Regular rate and rhythm,. No murmurs, no rubs, no clicks.  LUNGS: Clear to auscultation bilaterally. No rales or rhonchi. No wheezes.  ABDOMEN:  Soft, flat, nontender, nondistended. Has good bowel sounds. No hepatosplenomegaly appreciated.  EXTREMITIES: Dressing and protector boots in place over both feet.  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 NEUROLOGIC: The patient is alert, awake, and oriented x3 with no focal motor or sensory deficits appreciated bilaterally.  SKIN: Both feet dressing and boots in place.  Psych: Not anxious, depressed    Antibiotics   Anti-infectives (From admission, onward)   Start     Dose/Rate Route Frequency Ordered Stop   09/09/18 1630  ceFAZolin (ANCEF) 2,000 mg in dextrose 5 % 100 mL IVPB     2,000 mg 240 mL/hr over 30 Minutes Intravenous  Once 09/09/18 1626 09/09/18 1658   09/08/18 2300  ceFAZolin (ANCEF) IVPB 1 g/50 mL premix     1 g 100 mL/hr over 30 Minutes Intravenous  Once 09/08/18 2257 09/09/18 0005   09/08/18 1431  vancomycin (VANCOCIN) powder  Status:  Discontinued       As needed 09/08/18 1431 09/08/18 1534   09/08/18 1430  gentamicin (GARAMYCIN) injection  Status:  Discontinued       As needed 09/08/18 1431 09/08/18 1534   09/07/18 0243  vancomycin (VANCOCIN) IVPB 750 mg/150 ml premix     750 mg 150 mL/hr over 60 Minutes Intravenous Every 24 hours 09/07/18 0244     09/07/18 0130  vancomycin (VANCOCIN) IVPB 750 mg/150 ml premix  Status:  Discontinued      750 mg 150 mL/hr over 60 Minutes Intravenous Every 24 hours 09/06/18 0353 09/07/18 0244   09/06/18 2200  cefTRIAXone (ROCEPHIN) 2 g in sodium chloride 0.9 % 100 mL IVPB     2 g 200 mL/hr over 30 Minutes Intravenous Every 24 hours 09/06/18 2152     09/06/18 0400  piperacillin-tazobactam (ZOSYN) IVPB 3.375 g  Status:  Discontinued     3.375 g 12.5 mL/hr over 240 Minutes Intravenous Every 8 hours 09/06/18 0353 09/06/18 1907   09/05/18 2345  vancomycin (VANCOCIN) 2,000 mg in sodium chloride 0.9 % 500 mL IVPB     2,000 mg 250 mL/hr over 120 Minutes Intravenous  Once 09/05/18 2338 09/06/18 0329   09/05/18 2330  vancomycin (VANCOCIN) IVPB 1000 mg/200 mL premix  Status:  Discontinued     1,000 mg 200 mL/hr over 60 Minutes Intravenous  Once 09/05/18 2316 09/05/18 2338   09/05/18 2330  cefTRIAXone (ROCEPHIN) 2 g in sodium chloride 0.9 % 100 mL IVPB     2 g 200 mL/hr over 30 Minutes Intravenous  Once 09/05/18 2316 09/06/18 0129      Medications   Scheduled Meds: . amiodarone  200 mg Oral BID  . aspirin EC  81 mg Oral Daily  . carvedilol  3.125 mg Oral BID  . clopidogrel  75 mg Oral Q breakfast  . gabapentin  300 mg Oral TID  . heparin  5,000 Units Subcutaneous Q8H  . insulin aspart  0-5 Units Subcutaneous QHS  . insulin aspart  0-9 Units Subcutaneous TID WC  . insulin glargine  20 Units Subcutaneous q morning - 10a  . multivitamin-lutein  1 capsule Oral Daily  . mupirocin ointment   Nasal BID  . predniSONE  5 mg Oral Q breakfast  . Ensure Max Protein  11 oz Oral TID BM  . sodium chloride flush  3 mL Intravenous Q12H  . torsemide  40 mg Oral q morning - 10a   And  . torsemide  20 mg Oral QPM   Continuous Infusions: . sodium  chloride 0 mL/hr at 09/06/18 1459  . sodium chloride 75 mL/hr at 09/10/18 0751  . sodium chloride    . cefTRIAXone (ROCEPHIN)  IV Stopped (09/09/18 2252)  . vancomycin Stopped (09/10/18 0422)   PRN Meds:.sodium chloride, sodium chloride, acetaminophen **OR**  acetaminophen, morphine injection, ondansetron **OR** ondansetron (ZOFRAN) IV, ondansetron (ZOFRAN) IV, ondansetron (ZOFRAN) IV, oxyCODONE, sodium chloride flush   Data Review:   Micro Results Recent Results (from the past 240 hour(s))  SARS Coronavirus 2 (CEPHEID - Performed in [place] hospital lab), Hosp Order     Status: None   Collection Time: 09/05/18 11:49 PM  Result Value Ref Range Status   SARS Coronavirus 2 NEGATIVE NEGATIVE Final    Comment: (NOTE) If result is NEGATIVE SARS-CoV-2 target nucleic acids are NOT DETECTED. The SARS-CoV-2 RNA is generally detectable in upper and lower  respiratory specimens during the acute phase of infection. The lowest  concentration of SARS-CoV-2 viral copies this assay can detect is 250  copies / mL. A negative result does not preclude SARS-CoV-2 infection  and should not be used as the sole basis for treatment or other  patient management decisions.  A negative result may occur with  improper specimen collection / handling, submission of specimen other  than nasopharyngeal swab, presence of viral mutation(s) within the  areas targeted by this assay, and inadequate number of viral copies  (<250 copies / mL). A negative result must be combined with clinical  observations, patient history, and epidemiological information. If result is POSITIVE SARS-CoV-2 target nucleic acids are DETECTED. The SARS-CoV-2 RNA is generally detectable in upper and lower  respiratory specimens dur ing the acute phase of infection.  Positive  results are indicative of active infection with SARS-CoV-2.  Clinical  correlation with patient history and other diagnostic information is  necessary to determine patient infection status.  Positive results do  not rule out bacterial infection or co-infection with other viruses. If result is PRESUMPTIVE POSTIVE SARS-CoV-2 nucleic acids MAY BE PRESENT.   A presumptive positive result was obtained on the submitted  specimen  and confirmed on repeat testing.  While 2019 novel coronavirus  (SARS-CoV-2) nucleic acids may be present in the submitted sample  additional confirmatory testing may be necessary for epidemiological  and / or clinical management purposes  to differentiate between  SARS-CoV-2 and other Sarbecovirus currently known to infect humans.  If clinically indicated additional testing with an alternate test  methodology [id number]) is advised. The SARS-CoV-2 RNA is generally  detectable in upper and lower respiratory sp ecimens during the acute  phase of infection. The expected result is Negative. Fact Sheet for Patients:  StrictlyIdeas.no Fact Sheet for Healthcare Providers: BankingDealers.co.za This test is not yet approved or cleared by the [place] FDA and has been authorized for detection and/or diagnosis of SARS-CoV-2 by FDA under an Emergency Use Authorization (EUA).  This EUA will remain in effect (meaning this test can be used) for the duration of the COVID-19 declaration under Section 564(b)(1) of the Act, 21 U.S.C. section 360bbb-3(b)(1), unless the authorization is terminated or revoked sooner. Performed at [facility], [address] [place], [place] [address]   MRSA PCR Screening     Status: Abnormal   Collection Time: 09/06/18  4:35 AM  Result Value Ref Range Status   MRSA by PCR POSITIVE (A) NEGATIVE Final    Comment:        The GeneXpert MRSA Assay (FDA approved for NASAL specimens only), is one  component of a comprehensive MRSA colonization surveillance program. It is not intended to diagnose MRSA infection nor to guide or monitor treatment for MRSA infections. RESULT CALLED TO, READ BACK BY AND VERIFIED WITH: JENNIFER COLE @0812  ON 09/06/2018 BY FMW Performed at [facility], [address] [place], [place] [address]   Aerobic/Anaerobic Culture (surgical/deep wound)     Status: None  (Preliminary result)   Collection Time: 09/08/18  2:25 PM  Result Value Ref Range Status   Specimen Description   Final    TISSUE ACHILLES TENDON SOFT TISSUE Performed at [facility], [address], [place], [place] [address]    Special Requests   Final    NONE Performed at [facility], [address] [place], [place] [address]    Gram Stain   Final    MODERATE WBC PRESENT,BOTH PMN AND MONONUCLEAR FEW GRAM POSITIVE COCCI    Culture   Final    TOO YOUNG TO READ Performed at [facility], [place] [address], [place], [place] [address]    Report Status PENDING  Incomplete    Radiology Reports Ct Ankle Right Wo Contrast  Result Date: 09/07/2018 CLINICAL DATA:  Chronic wound on the heel. EXAM: CT OF THE RIGHT ANKLE WITHOUT CONTRAST TECHNIQUE: Multidetector CT imaging of the right ankle was performed according to the standard protocol. Multiplanar CT image reconstructions were also generated. COMPARISON:  CT right foot 05/25/2018. Plain films right foot 09/05/2018. FINDINGS: Bones/Joint/Cartilage No bony destructive change or periosteal reaction is identified. No fracture or dislocation. Mid to distal transmetatarsal amputations again seen. Ligaments Suboptimally assessed by CT. Muscles and Tendons Since the prior CT, the patient has developed fluid containing a small amount of gas within and anterior to the distal Achilles tendon. Discrete measurement is difficult but fluid measures approximately 2.3 cm transverse by 1.3 cm AP by approximately 7 cm craniocaudal. The Achilles tendon appears completely torn 2 cm above its insertion on the calcaneus. Soft tissues There is subcutaneous edema about the foot. Subcutaneous edema is seen about the ankle and foot. IMPRESSION: Large skin wound over the heel. Findings highly suspicious for septic Achilles tenosynovitis and likely disruption of the Achilles tendon are identified. If the patient is able, MRI with and without contrast  would be better for evaluation of these findings. Negative for CT evidence of osteomyelitis. Status post mid to distal transmetatarsal amputations as soon on prior exams. Electronically Signed   By: Inge Rise M.D.   On: 09/07/2018 15:08   US Carotid Bilateral  Result Date: 09/07/2018 CLINICAL DATA:  Hypertension, previous stroke, coronary artery disease, left visual disturbance, diabetes, history of left carotid stent/endarterectomy. EXAM: BILATERAL CAROTID DUPLEX ULTRASOUND TECHNIQUE: Pearline Cables scale imaging, color Doppler and duplex ultrasound were performed of bilateral carotid and vertebral arteries in the neck. COMPARISON:  None. FINDINGS: Criteria: Quantification of carotid stenosis is based on velocity parameters that correlate the residual internal carotid diameter with NASCET-based stenosis levels, using the diameter of the distal internal carotid lumen as the denominator for stenosis measurement. The following velocity measurements were obtained: RIGHT ICA: 132/55 cm/sec CCA: 38/18 cm/sec SYSTOLIC ICA/CCA RATIO:  1.4 ECA: 264 cm/sec LEFT ICA: 117/48 cm/sec CCA: 29/93 cm/sec SYSTOLIC ICA/CCA RATIO:  1.5 ECA: 153 cm/sec RIGHT CAROTID ARTERY: Mild partially calcified plaque in the mid and distal common carotid artery, bulb, proximal ECA and ICA. There is at least mild stenosis in the proximal ECA. No high-grade ICA stenosis. Normal waveforms and color Doppler signal. RIGHT  VERTEBRAL ARTERY:  Normal flow direction and waveform. LEFT CAROTID ARTERY: Patent stent from the distal common carotid artery across the bulb into the proximal ICA. No high-grade stenosis. Normal waveforms and color Doppler signal. Atheromatous plaque in mid and distal ICA. Continued antegrade flow into the external carotid artery. LEFT VERTEBRAL ARTERY:  Normal flow direction and waveform. IMPRESSION: 1. Right carotid bifurcation plaque resulting in less than 50% diameter ICA stenosis. 2. Patent left carotid stent without evidence of  high-grade residual/recurrent stenosis. 3.  Antegrade bilateral vertebral arterial flow. Electronically Signed   By: Lucrezia Europe M.D.   On: 09/07/2018 08:42   Dg Foot 2 Views Right  Result Date: 09/05/2018 CLINICAL DATA:  53 y/o M; wound evaluation. 07/28/2018 calcaneal osteotomy and debridement. EXAM: RIGHT FOOT - 2 VIEW COMPARISON:  07/25/2018 right ankle radiographs. 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 Large dorsal calcaneal soft tissue post debridement, increased in size of soft tissue defect from prior CT of the ankle. Additionally, there is increased cortical lucency along the subjacent calcaneus. Vascular calcifications noted. IMPRESSION: Dorsal calcaneus soft tissue ulceration and subjacent bony cortical lucencies are increased in comparison with the prior CT of ankle which may represent progressive infection and/or interval postsurgical changes. Electronically Signed   By: Kristine Garbe M.D.   On: 09/05/2018 22:27   [place] Ekg Site Rite  Result Date: 09/09/2018 If Site Rite image not attached, placement could not be confirmed due to current cardiac rhythm.    CBC Recent Labs  Lab 09/05/18 1754 09/06/18 0411 09/07/18 0435 09/08/18 0325 09/09/18 0336 09/10/18 0448  WBC 12.8* 15.7* 12.1* 12.5* 13.0* 13.7*  HGB 7.7* 8.7* 7.4* 6.9* 8.7* 8.1*  HCT 26.0* 29.2* 25.3* 23.4* 28.0* 26.0*  PLT 327 390 329 302 347 350  MCV 86.4 86.1 86.6 85.7 84.3 84.7  MCH 25.6* 25.7* 25.3* 25.3* 26.2 26.4  MCHC 29.6* 29.8* 29.2* 29.5* 31.1 31.2  RDW 17.5* 17.6* 17.7* 17.8* 17.2* 17.5*  LYMPHSABS 0.9  --   --   --   --   --   MONOABS 0.7  --   --   --   --   --   EOSABS 0.0  --   --   --   --   --   BASOSABS 0.0  --   --   --   --   --     Chemistries  Recent Labs  Lab 09/05/18 1754 09/06/18 0411 09/07/18 0435 09/08/18 0325 09/09/18 0336 09/10/18 0449  NA 136 138 140 136 135 136  K 3.7 3.6 4.1 3.9 4.1 4.0  CL 103 105 106  101 99 101  CO2 20* 25 25 24 23 26   GLUCOSE 280* 175* 109* 130* 265* 273*  BUN 60* 56* 53* 61* 66* 60*  CREATININE 2.85* 2.59* 2.75* 2.78* 2.63* 2.35*  CALCIUM 8.9 8.9 8.6* 8.3* 8.4* 8.4*  MG  --   --   --   --  1.7  --   AST 15  --   --   --   --   --   ALT 9  --   --   --   --   --   ALKPHOS 99  --   --   --   --   --   BILITOT 0.5  --   --   --   --   --    ------------------------------------------------------------------------------------------------------------------ estimated creatinine clearance is 41.5 mL/min (A) (by C-G  formula based on SCr of 2.35 mg/dL (H)). ------------------------------------------------------------------------------------------------------------------ No results for input(s): HGBA1C in the last 72 hours. ------------------------------------------------------------------------------------------------------------------ No results for input(s): CHOL, HDL, LDLCALC, TRIG, CHOLHDL, LDLDIRECT in the last 72 hours. ------------------------------------------------------------------------------------------------------------------ No results for input(s): TSH, T4TOTAL, T3FREE, THYROIDAB in the last 72 hours.  Invalid input(s): FREET3 ------------------------------------------------------------------------------------------------------------------ No results for input(s): VITAMINB12, FOLATE, FERRITIN, TIBC, IRON, RETICCTPCT in the last 72 hours.  Coagulation profile No results for input(s): INR, PROTIME in the last 168 hours.  No results for input(s): DDIMER in the last 72 hours.  Cardiac Enzymes No results for input(s): CKMB, TROPONINI, MYOGLOBIN in the last 168 hours.  Invalid input(s): CK ------------------------------------------------------------------------------------------------------------------ Invalid input(s): [place]  Patient is 53 year old with recurrent foot infection  1.  Acute osteomyelitis of right foot- WBC  increasing. -S/p excision of infected necrotic right Achilles tendon and excision of right posterior calcaneous 5/7 -S/p angiogram with angioplasty to R popliteal 5/8 -Continue vancomycin and ceftriaxone -ID following- awaiting abx recommendations based on culture. Will need IV abx for 4 weeks. -Podiatry to change dressings today  2.   HTN- blood pressure stable -Continue home dose antihypertensives  3. Normocytic anemia- likely iron deficiency and acute blood loss anemia. -s/p 1 unit PRBC 5/8, hgb stable after tranfusion  4.  Type 2 diabetes -Lantus and SSI  5.   CAD-stable, no active chest pain  -Continue home meds  6.  Chronic systolic CHF-stable, no signs of acute exacerbation -Continue home meds  7. CKD stage IV-creatinine at baseline -Avoid nephrotoxic agents -Monitor      Code Status Orders  (From admission, onward)         Start     Ordered   09/06/18 0259  Full code  Continuous     09/06/18 0258        Code Status History    Date Active Date Inactive Code Status Order ID Comments User Context   07/25/2018 1717 08/02/2018 1919 Full Code [id number]  Dustin Flock, MD Inpatient   05/25/2018 0352 05/29/2018 2022 Full Code [id number]  Hillary Bow, MD ED   10/09/2016 1003 10/09/2016 1340 Full Code [id number]  Samara Deist, DPM Inpatient   08/22/2016 0042 08/27/2016 1649 Full Code [id number]  Lance Coon, MD Inpatient   07/18/2014 1818 07/20/2014 2002 Full Code [id number]  Leighton Parody, PA-C Inpatient           Consults podiatry  DVT Prophylaxis  Lovenox    Lab Results  Component Value Date   PLT 350 09/10/2018     Time Spent in minutes 35 minutes greater than 50% of time spent in care coordination and counseling patient regarding the condition and plan of care.   Berna Spare Mayo M.D on 09/10/2018 at 10:54 AM  Between 7am to 6pm - Pager - [phone]  After 6pm go to [email or web address] - Proofreader  [employer]   Office  [phone]

## 2018-09-10 NOTE — Evaluation (Signed)
Physical Therapy Evaluation Patient Details Name: Paul [name]. 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 PMH: cervical myelopathy, OSA, cervical radiculopahty with bial thand flexion neurogenic weaknes, CHF, CKD, CAD s/p CABGx3, DM, R transmet amputation, Rt hip surgery. Pt underwent surgical debridement/resection of insertional Rt achilles tendon and resection of Rt calcaneus on 5/7. On 5/8 pt underwent TPA of the Rt poplietal artery, and Rt IJ cath placement for IV ABX at DC.   Clinical Impression  Pt admitted with above diagnosis. Pt currently with functional limitations due to the deficits listed below (see "PT Problem List"). Upon entry, pt in bed, awake and agreeable to participate. The pt is alert and oriented x3, pleasant, conversational, and generally a good historian. Postoperative restrictions reviewed with patient, he is already aware. Pt perfroms bed mobility independently, and then supervision for STS and SPT c RW. Pt instructed on NWB for transfers, but he is unable to maintain during STS. He appears to perform TDWB when rising from sitting, without any significant force through the RLE, then elevates the foot to NWB once standing. Pt is able to maintain NWB during hopping SPT to chair. Functional mobility assessment demonstrates increased effort/time requirements, poor tolerance, and need for physical assistance, whereas the patient performed these at a higher level of independence PTA. Additional activity is withheld until clearance is given by podiatry- pt will need to practice stairs training for safe entry into home, although I suspect maintaining NWB during stairs will be very difficult 2/2 pt's history of cervical  myelopathy/radiculopathy and BUE neurogenic weakness. Pt will benefit from skilled PT intervention to increase independence and safety with basic mobility in preparation for discharge to the venue listed below.       Follow Up Recommendations Home health PT;Supervision for mobility/OOB    Equipment Recommendations  None recommended by PT    Recommendations for Other Services       Precautions / Restrictions Precautions Precautions: Fall Precaution Comments: pt refuses chair alarm at eval Required Braces or Orthoses: (prevalon boots when in bed ) Restrictions RLE Weight Bearing: Non weight bearing LLE Weight Bearing: Weight bearing as tolerated      Mobility  Bed Mobility Overal bed mobility: Independent                Transfers Overall transfer level: Needs assistance Equipment used: Rolling walker (2 wheeled) Transfers: Sit to/from [facility] Sit to Stand: Supervision Stand pivot transfers: Supervision       General transfer comment: reviewed precautions; pt practicing STS transfer with LLE/RW 5x from chair. Pt reports he is unable to perform NWB, but can perform TDWB transfers; this is visually confirmed via prevalon boot on RLE.   Ambulation/Gait Ambulation/Gait assistance: (MD orders for transfer training only at this time. )              Stairs Stairs: (will need help with stairs training prior to DC, has 3 to enter boarding house. )          Wheelchair Mobility    Modified Rankin (Stroke Patients Only)       Balance Overall balance assessment: Modified Independent;Mild deficits observed, not formally tested  Pertinent Vitals/Pain Pain Assessment: Faces Faces Pain Scale: Hurts even more Pain Location: Right foot surgical site  Pain Descriptors / Indicators: Throbbing Pain Intervention(s): Limited activity within patient's tolerance;Monitored during  session;Premedicated before session;Repositioned(reports morphien didn't help with pain at all)    Home Living Family/patient expects to be discharged to:: Other (Comment)(boarding house ) Living Arrangements: Non-relatives/Friends Available Help at Discharge: Friend(s);Available PRN/intermittently;Other (Comment)(boarding house owner is being paid to bring meals to patient PTA) Type of Home: House Home Access: Stairs to enter Entrance Stairs-Rails: Right Entrance Stairs-Number of Steps: 3 Home Layout: One level Home Equipment: Walker - 2 wheels;Walker - 4 wheels      Prior Function Level of Independence: Independent with assistive device(s)         Comments: Ambulates household distances with 4ww (uses RW in community--keeps walker in car); house too small for manual w/c so pt reports he doesn't have it anymore.  Pt reports fall 4 months ago.  Pt reports going to rehab for "99 days" and got out January 15th.     Hand Dominance   Dominant Hand: Right    Extremity/Trunk Assessment   Upper Extremity Assessment Upper Extremity Assessment: Overall WFL for tasks assessed;Generalized weakness(chronic BUE hand flexion weakness realted to severe cervical spine pathology)    Lower Extremity Assessment Lower Extremity Assessment: Overall WFL for tasks assessed    Cervical / Trunk Assessment Cervical / Trunk Assessment: Normal  Communication   Communication: No difficulties  Cognition Arousal/Alertness: Awake/alert Behavior During Therapy: WFL for tasks assessed/performed Overall Cognitive Status: Within Functional Limits for tasks assessed                                        General Comments      Exercises     Assessment/Plan    PT Assessment Patient needs continued PT services  PT Problem List Decreased strength;Decreased activity tolerance;Decreased balance;Decreased mobility;Decreased knowledge of precautions;Decreased knowledge of use of DME        PT Treatment Interventions DME instruction;Therapeutic exercise;Balance training;Stair training;Functional mobility training;Gait training;Therapeutic activities;Patient/family education    PT Goals (Current goals can be found in the Care Plan section)  Acute Rehab PT Goals Patient Stated Goal: pt would like to be as mobile as possible, but understands limitations from MD PT Goal Formulation: With patient Time For Goal Achievement: 09/24/18 Potential to Achieve Goals: Good    Frequency 7X/week   Barriers to discharge Decreased caregiver support;Inaccessible home environment three steps to enter    Co-evaluation               AM-PAC PT "6 Clicks" Mobility  Outcome Measure Help needed turning from your back to your side while in a flat bed without using bedrails?: None Help needed moving from lying on your back to sitting on the side of a flat bed without using bedrails?: None Help needed moving to and from a bed to a chair (including a wheelchair)?: A Little Help needed standing up from a chair using your arms (e.g., wheelchair or bedside chair)?: None Help needed to walk in hospital room?: A Little Help needed climbing 3-5 steps with a railing? : A Lot 6 Click Score: 20    End of Session Equipment Utilized During Treatment: Gait belt(prevalon booties) Activity Tolerance: Patient tolerated treatment well Patient left: in chair;with call bell/phone within reach(chair alarm refused) Nurse Communication: Mobility status PT Visit Diagnosis: Unsteadiness  on feet (R26.81);Other abnormalities of gait and mobility (R26.89);Difficulty in walking, not elsewhere classified (R26.2);Muscle weakness (generalized) (M62.81)    Time: 4199-1444 PT Time Calculation (min) (ACUTE ONLY): 23 min   Charges:   PT Evaluation $PT Eval Moderate Complexity: 1 Mod PT Treatments $Therapeutic Exercise: 8-22 mins       2:11 PM, 09/10/18 Etta Grandchild, PT, DPT Physical Therapist - [facility]  [phone] ([place])    [place] C 09/10/2018, 2:06 PM

## 2018-09-10 NOTE — Progress Notes (Signed)
Patient a&o, VSS, pain controlled with medication. FWB on LLE, NWB on RLE. No complaints at this time. Dressing dry and intact on right foot. Patient up in chair. Call bell in reach. Continue to monitor.

## 2018-09-11 LAB — RENAL FUNCTION PANEL
Albumin: 2.9 g/dL — ABNORMAL LOW (ref 3.5–5.0)
Anion gap: 11 (ref 5–15)
BUN: 61 mg/dL — ABNORMAL HIGH (ref 6–20)
CO2: 25 mmol/L (ref 22–32)
Calcium: 8.8 mg/dL — ABNORMAL LOW (ref 8.9–10.3)
Chloride: 102 mmol/L (ref 98–111)
Creatinine, Ser: 2.73 mg/dL — ABNORMAL HIGH (ref 0.61–1.24)
GFR calc Af Amer: 29 mL/min — ABNORMAL LOW (ref >60)
GFR calc non Af Amer: 25 mL/min — ABNORMAL LOW (ref >60)
Glucose, Bld: 142 mg/dL — ABNORMAL HIGH (ref 70–99)
Phosphorus: 4.2 mg/dL (ref 2.5–4.6)
Potassium: 3.8 mmol/L (ref 3.5–5.1)
Sodium: 138 mmol/L (ref 135–145)

## 2018-09-11 LAB — CBC
HCT: 26.5 % — ABNORMAL LOW (ref 39.0–52.0)
Hemoglobin: 8.2 g/dL — ABNORMAL LOW (ref 13.0–17.0)
MCH: 26.3 pg (ref 26.0–34.0)
MCHC: 30.9 g/dL (ref 30.0–36.0)
MCV: 84.9 fL (ref 80.0–100.0)
Platelets: 351 10*3/uL (ref 150–400)
RBC: 3.12 MIL/uL — ABNORMAL LOW (ref 4.22–5.81)
RDW: 17.6 % — ABNORMAL HIGH (ref 11.5–15.5)
WBC: 12.4 10*3/uL — ABNORMAL HIGH (ref 4.0–10.5)
nRBC: 0 % (ref 0.0–0.2)

## 2018-09-11 LAB — GLUCOSE, CAPILLARY
Glucose-Capillary: 108 mg/dL — ABNORMAL HIGH (ref 70–99)
Glucose-Capillary: 108 mg/dL — ABNORMAL HIGH (ref 70–99)
Glucose-Capillary: 194 mg/dL — ABNORMAL HIGH (ref 70–99)
Glucose-Capillary: 293 mg/dL — ABNORMAL HIGH (ref 70–99)

## 2018-09-11 LAB — VANCOMYCIN, TROUGH: Vancomycin Tr: 27 ug/mL (ref 15–20)

## 2018-09-11 MED ORDER — VANCOMYCIN HCL IN DEXTROSE 1-5 GM/200ML-% IV SOLN: 1000.0000 mg | INTRAVENOUS | Status: DC

## 2018-09-11 NOTE — Plan of Care (Signed)
  Problem: Health Behavior/Discharge Planning: Goal: Ability to manage health-related needs will improve Outcome: Progressing   Problem: Clinical Measurements: Goal: Ability to maintain clinical measurements within normal limits will improve Outcome: Progressing Goal: Will remain free from infection Outcome: Progressing Goal: Diagnostic test results will improve Outcome: Progressing Goal: Respiratory complications will improve Outcome: Progressing Goal: Cardiovascular complication will be avoided Outcome: Progressing   Problem: Activity: Goal: Risk for activity intolerance will decrease Outcome: Progressing   Problem: Elimination: Goal: Will not experience complications related to bowel motility Outcome: Progressing Goal: Will not experience complications related to urinary retention Outcome: Progressing   Problem: Pain Managment: Goal: General experience of comfort will improve Outcome: Progressing   Problem: Safety: Goal: Ability to remain free from injury will improve Outcome: Progressing   Problem: Skin Integrity: Goal: Risk for impaired skin integrity will decrease Outcome: Progressing   Problem: Problem: Skin/Wound Progression Goal: Wound healing without signs/symptoms of infection Outcome: Progressing

## 2018-09-11 NOTE — Progress Notes (Addendum)
Pharmacy Antibiotic Note  Paul [name]. is a 53 y.o. male admitted on 09/05/2018 with cellulitis.  Pharmacy has been consulted for vanc/zosyn dosing.  Plan: 05/09 @ 0449 vanc peak = 36; 05/10 @ 0129 vanc trough = 27 (dose was not given when level drawn).   New vanc regimen will not be started until 05/12 @ 0900 in order to give patient time to clear. Vancomycin 1000 mg IV Q 48 hrs. Goal AUC 400-550. Expected AUC: 498.7 SCr used: 2.35 Cssmin: 14.7  05/11 @ 0600 VR 18 mcg/mL. Will start new vanc regimen this morning and adjust peak and trough as necessary.  Scr appears to be staying stable, will continue to monitor renal function and adjust as necessary.  Height: 5\' 9"  (175.3 cm) Weight: 210 lb 15.7 oz (95.7 kg) IBW/kg (Calculated) : 70.7  Temp (24hrs), Avg:97.7 F (36.5 C), Min:97.4 F (36.3 C), Max:98.2 F (36.8 C)  Recent Labs  Lab 09/06/18 0411 09/07/18 0023 09/07/18 0435 09/08/18 0325 09/09/18 0336 09/10/18 0448 09/10/18 0449 09/11/18 0129  WBC 15.7*  --  12.1* 12.5* 13.0* 13.7*  --   --   CREATININE 2.59*  --  2.75* 2.78* 2.63*  --  2.35*  --   VANCOTROUGH  --   --   --   --   --   --   --  27*  VANCOPEAK  --   --   --   --   --   --  36  --   VANCORANDOM  --  15 25  --   --   --   --   --     Estimated Creatinine Clearance: 41.5 mL/min (A) (by C-G formula based on SCr of 2.35 mg/dL (H)).    Allergies  Allergen Reactions  . Other Other (See Comments)    Cardiac Problems. Pt states he tolerates Toradol. Due to kidney and heart problems per pt  . Ibuprofen Other (See Comments)    Heart problems  . Baclofen Other (See Comments)  . Metformin Diarrhea  . Nsaids     Due to kidney and heart problems per pt    Thank you for allowing pharmacy to be a part of this patient's care.  Tobie Lords, PharmD, BCPS Clinical Pharmacist 09/11/2018

## 2018-09-11 NOTE — Progress Notes (Signed)
Subjective: Patient is alert well oriented this morning resting in bed without any complaints states he feels pretty good.  He is able to get up yesterday with the help of physical therapy to use a bedside toilet and to shift to the chair.  Objective: Bandages are clean dry and intact legs are nonswollen he is afebrile white count is down to normal limits.  Assessment/plan: Today I will leave dressings intact I will change him tomorrow and see if we can move him towards discharge perhaps tomorrow.  My concern with him is that he should really bear weight on the right foot and he needs regular dressing changes but we may consider put him in a below-knee weightbearing cast sometime tomorrow.  I wake up a little bit of weight off without stressing the wound too much.  This would have to be changed on a weekly basis in my office.  We will see if that is an option for tomorrow.  Also I think is probably going to need IV antibiotics for a while.  Still awaiting cultures to come back.  Initial culture shows staph aureus but sensitivities have not come in yet.

## 2018-09-11 NOTE — Progress Notes (Signed)
[place] at [facility]                                                                                                                                                                                  Patient Demographics   Paul [name], is a 53 y.o. male, DOB - 15-Jan-1966, ERD:[id number]  Admit date - 09/05/2018   Admitting Physician Lance Coon, MD  Outpatient Primary MD for the patient is Tracie Harrier, MD   LOS - 5  Subjective: Patient states he is feeling well today.  He denies any significant foot pain.  No fevers or chills.  Review of Systems:   CONSTITUTIONAL: No documented fever. No fatigue, weakness. No weight gain, no weight loss.  EYES: No blurry or double vision.  ENT: No tinnitus. No postnasal drip. No redness of the oropharynx.  RESPIRATORY: No cough, no wheeze, no hemoptysis. No dyspnea.  CARDIOVASCULAR: No chest pain. No orthopnea. No palpitations. No syncope.  GASTROINTESTINAL: No nausea, no vomiting or diarrhea. No abdominal pain. No melena or hematochezia.  GENITOURINARY: No dysuria or hematuria.  ENDOCRINE: No polyuria or nocturia. No heat or cold intolerance.  HEMATOLOGY: No anemia. No bruising. No bleeding.  INTEGUMENTARY: Positive drainage from the foot MUSCULOSKELETAL: No arthritis. No swelling. No gout.  Positive neck pain NEUROLOGIC: No numbness, tingling, or ataxia. No seizure-type activity.  PSYCHIATRIC: No anxiety. No insomnia. No ADD.    Vitals:   Vitals:   09/10/18 0328 09/10/18 1707 09/10/18 2012 09/11/18 0454  BP: 135/87 (!) 152/87 136/81 126/79  Pulse: 77 76 75 75  Resp:  16 18 18   Temp: 97.6 F (36.4 C) (!) 97.4 F (36.3 C) 98.2 F (36.8 C) (!) 97.5 F (36.4 C)  TempSrc: Oral Oral Oral Oral  SpO2: 93% 100% 99% 100%  Weight:      Height:        Wt Readings from Last 3 Encounters:  09/09/18 95.7 kg  07/25/18 94.8 kg  06/08/18 100.8 kg     Intake/Output Summary (Last 24 hours) at 09/11/2018 1121 Last  data filed at 09/11/2018 0950 Gross per 24 hour  Intake 617.4 ml  Output 1700 ml  Net -1082.6 ml    Physical Exam:   GENERAL: Pleasant-appearing in no apparent distress.  HEENT: Atraumatic, normocephalic. Extraocular muscles are intact. Pupils equal and reactive to light. Sclerae anicteric. No conjunctival injection. No oro-pharyngeal erythema.  NECK: Supple. There is no jugular venous distention. No bruits, no lymphadenopathy, no thyromegaly. +Right IJ catheter in place HEART: Regular rate and rhythm,. No murmurs, no rubs, no clicks.  LUNGS: Clear to auscultation bilaterally. No rales or rhonchi. No wheezes.  ABDOMEN: Soft,  flat, nontender, nondistended. Has good bowel sounds. No hepatosplenomegaly appreciated.  EXTREMITIES: Dressing and protector boots in place over both feet.  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 NEUROLOGIC: The patient is alert, awake, and oriented x3 with no focal motor or sensory deficits appreciated bilaterally.  SKIN: Both feet dressing and boots in place.  Psych: Not anxious, depressed    Antibiotics   Anti-infectives (From admission, onward)   Start     Dose/Rate Route Frequency Ordered Stop   09/13/18 0900  vancomycin (VANCOCIN) IVPB 1000 mg/200 mL premix     1,000 mg 200 mL/hr over 60 Minutes Intravenous Every 48 hours 09/11/18 0210     09/09/18 1630  ceFAZolin (ANCEF) 2,000 mg in dextrose 5 % 100 mL IVPB     2,000 mg 240 mL/hr over 30 Minutes Intravenous  Once 09/09/18 1626 09/09/18 1658   09/08/18 2300  ceFAZolin (ANCEF) IVPB 1 g/50 mL premix     1 g 100 mL/hr over 30 Minutes Intravenous  Once 09/08/18 2257 09/09/18 0005   09/08/18 1431  vancomycin (VANCOCIN) powder  Status:  Discontinued       As needed 09/08/18 1431 09/08/18 1534   09/08/18 1430  gentamicin (GARAMYCIN) injection  Status:  Discontinued       As needed 09/08/18 1431 09/08/18 1534   09/07/18 0243  vancomycin (VANCOCIN) IVPB 750 mg/150 ml premix  Status:  Discontinued     750  mg 150 mL/hr over 60 Minutes Intravenous Every 24 hours 09/07/18 0244 09/11/18 0204   09/07/18 0130  vancomycin (VANCOCIN) IVPB 750 mg/150 ml premix  Status:  Discontinued     750 mg 150 mL/hr over 60 Minutes Intravenous Every 24 hours 09/06/18 0353 09/07/18 0244   09/06/18 2200  cefTRIAXone (ROCEPHIN) 2 g in sodium chloride 0.9 % 100 mL IVPB     2 g 200 mL/hr over 30 Minutes Intravenous Every 24 hours 09/06/18 2152     09/06/18 0400  piperacillin-tazobactam (ZOSYN) IVPB 3.375 g  Status:  Discontinued     3.375 g 12.5 mL/hr over 240 Minutes Intravenous Every 8 hours 09/06/18 0353 09/06/18 1907   09/05/18 2345  vancomycin (VANCOCIN) 2,000 mg in sodium chloride 0.9 % 500 mL IVPB     2,000 mg 250 mL/hr over 120 Minutes Intravenous  Once 09/05/18 2338 09/06/18 0329   09/05/18 2330  vancomycin (VANCOCIN) IVPB 1000 mg/200 mL premix  Status:  Discontinued     1,000 mg 200 mL/hr over 60 Minutes Intravenous  Once 09/05/18 2316 09/05/18 2338   09/05/18 2330  cefTRIAXone (ROCEPHIN) 2 g in sodium chloride 0.9 % 100 mL IVPB     2 g 200 mL/hr over 30 Minutes Intravenous  Once 09/05/18 2316 09/06/18 0129      Medications   Scheduled Meds: . amiodarone  200 mg Oral BID  . aspirin EC  81 mg Oral Daily  . carvedilol  3.125 mg Oral BID  . clopidogrel  75 mg Oral Q breakfast  . gabapentin  300 mg Oral TID  . heparin  5,000 Units Subcutaneous Q8H  . insulin aspart  0-5 Units Subcutaneous QHS  . insulin aspart  0-9 Units Subcutaneous TID WC  . insulin glargine  20 Units Subcutaneous q morning - 10a  . multivitamin-lutein  1 capsule Oral Daily  . mupirocin ointment   Nasal BID  . predniSONE  5 mg Oral Q breakfast  . Ensure Max Protein  11 oz Oral TID BM  . sodium chloride  flush  3 mL Intravenous Q12H  . torsemide  40 mg Oral q morning - 10a   And  . torsemide  20 mg Oral QPM   Continuous Infusions: . sodium chloride 0 mL/hr at 09/06/18 1459  . sodium chloride    . cefTRIAXone (ROCEPHIN)  IV  Stopped (09/10/18 2218)  . [START ON 09/13/2018] vancomycin     PRN Meds:.sodium chloride, sodium chloride, acetaminophen **OR** acetaminophen, morphine injection, ondansetron **OR** ondansetron (ZOFRAN) IV, ondansetron (ZOFRAN) IV, ondansetron (ZOFRAN) IV, oxyCODONE, sodium chloride flush   Data Review:   Micro Results Recent Results (from the past 240 hour(s))  SARS Coronavirus 2 (CEPHEID - Performed in [place] hospital lab), Hosp Order     Status: None   Collection Time: 09/05/18 11:49 PM  Result Value Ref Range Status   SARS Coronavirus 2 NEGATIVE NEGATIVE Final    Comment: (NOTE) If result is NEGATIVE SARS-CoV-2 target nucleic acids are NOT DETECTED. The SARS-CoV-2 RNA is generally detectable in upper and lower  respiratory specimens during the acute phase of infection. The lowest  concentration of SARS-CoV-2 viral copies this assay can detect is 250  copies / mL. A negative result does not preclude SARS-CoV-2 infection  and should not be used as the sole basis for treatment or other  patient management decisions.  A negative result may occur with  improper specimen collection / handling, submission of specimen other  than nasopharyngeal swab, presence of viral mutation(s) within the  areas targeted by this assay, and inadequate number of viral copies  (<250 copies / mL). A negative result must be combined with clinical  observations, patient history, and epidemiological information. If result is POSITIVE SARS-CoV-2 target nucleic acids are DETECTED. The SARS-CoV-2 RNA is generally detectable in upper and lower  respiratory specimens dur ing the acute phase of infection.  Positive  results are indicative of active infection with SARS-CoV-2.  Clinical  correlation with patient history and other diagnostic information is  necessary to determine patient infection status.  Positive results do  not rule out bacterial infection or co-infection with other viruses. If result is  PRESUMPTIVE POSTIVE SARS-CoV-2 nucleic acids MAY BE PRESENT.   A presumptive positive result was obtained on the submitted specimen  and confirmed on repeat testing.  While 2019 novel coronavirus  (SARS-CoV-2) nucleic acids may be present in the submitted sample  additional confirmatory testing may be necessary for epidemiological  and / or clinical management purposes  to differentiate between  SARS-CoV-2 and other Sarbecovirus currently known to infect humans.  If clinically indicated additional testing with an alternate test  methodology [phone]) is advised. The SARS-CoV-2 RNA is generally  detectable in upper and lower respiratory sp ecimens during the acute  phase of infection. The expected result is Negative. Fact Sheet for Patients:  StrictlyIdeas.no Fact Sheet for Healthcare Providers: BankingDealers.co.za This test is not yet approved or cleared by the [place] FDA and has been authorized for detection and/or diagnosis of SARS-CoV-2 by FDA under an Emergency Use Authorization (EUA).  This EUA will remain in effect (meaning this test can be used) for the duration of the COVID-19 declaration under Section 564(b)(1) of the Act, 21 U.S.C. section 360bbb-3(b)(1), unless the authorization is terminated or revoked sooner. Performed at [facility], [address] [place], [place] [address]   MRSA PCR Screening     Status: Abnormal   Collection Time: 09/06/18  4:35 AM  Result Value Ref Range Status   MRSA by PCR  POSITIVE (A) NEGATIVE Final    Comment:        The GeneXpert MRSA Assay (FDA approved for NASAL specimens only), is one component of a comprehensive MRSA colonization surveillance program. It is not intended to diagnose MRSA infection nor to guide or monitor treatment for MRSA infections. RESULT CALLED TO, READ BACK BY AND VERIFIED WITH: JENNIFER COLE @0812  ON 09/06/2018 BY FMW Performed at [facility], [address] [place], [place] [address]   Aerobic/Anaerobic Culture (surgical/deep wound)     Status: None (Preliminary result)   Collection Time: 09/08/18  2:25 PM  Result Value Ref Range Status   Specimen Description   Final    TISSUE ACHILLES TENDON SOFT TISSUE Performed at [facility], [address] [place], [place] [address]    Special Requests   Final    NONE Performed at [facility], [address] [place], [place] [address]    Gram Stain   Final    MODERATE WBC PRESENT,BOTH PMN AND MONONUCLEAR FEW GRAM POSITIVE COCCI    Culture   Final    ABUNDANT METHICILLIN RESISTANT STAPHYLOCOCCUS AUREUS HOLDING FOR POSSIBLE ANAEROBE Performed at [facility], [place] [address], [place],  [address]    Report Status PENDING  Incomplete   Organism ID, Bacteria METHICILLIN RESISTANT STAPHYLOCOCCUS AUREUS  Final      Susceptibility   Methicillin resistant staphylococcus aureus - MIC*    CIPROFLOXACIN >=8 RESISTANT Resistant     ERYTHROMYCIN >=8 RESISTANT Resistant     GENTAMICIN <=0.5 SENSITIVE Sensitive     OXACILLIN >=4 RESISTANT Resistant     TETRACYCLINE <=1 SENSITIVE Sensitive     VANCOMYCIN 1 SENSITIVE Sensitive     TRIMETH/SULFA >=320 RESISTANT Resistant     CLINDAMYCIN <=0.25 SENSITIVE Sensitive     RIFAMPIN <=0.5 SENSITIVE Sensitive     Inducible Clindamycin NEGATIVE Sensitive     * ABUNDANT METHICILLIN RESISTANT STAPHYLOCOCCUS AUREUS    Radiology Reports Ct Ankle Right Wo Contrast  Result Date: 09/07/2018 CLINICAL DATA:  Chronic wound on the heel. EXAM: CT OF THE RIGHT ANKLE WITHOUT CONTRAST TECHNIQUE: Multidetector CT imaging of the right ankle was performed according to the standard protocol. Multiplanar CT image reconstructions were also generated. COMPARISON:  CT right foot 05/25/2018. Plain films right foot 09/05/2018. FINDINGS: Bones/Joint/Cartilage No bony destructive change or periosteal reaction is identified. No  fracture or dislocation. Mid to distal transmetatarsal amputations again seen. Ligaments Suboptimally assessed by CT. Muscles and Tendons Since the prior CT, the patient has developed fluid containing a small amount of gas within and anterior to the distal Achilles tendon. Discrete measurement is difficult but fluid measures approximately 2.3 cm transverse by 1.3 cm AP by approximately 7 cm craniocaudal. The Achilles tendon appears completely torn 2 cm above its insertion on the calcaneus. Soft tissues There is subcutaneous edema about the foot. Subcutaneous edema is seen about the ankle and foot. IMPRESSION: Large skin wound over the heel. Findings highly suspicious for septic Achilles tenosynovitis and likely disruption of the Achilles tendon are identified. If the patient is able, MRI with and without contrast would be better for evaluation of these findings. Negative for CT evidence of osteomyelitis. Status post mid to distal transmetatarsal amputations as soon on prior exams. Electronically Signed   By: Inge Rise M.D.   On: 09/07/2018 15:08   US Carotid Bilateral  Result Date: 09/07/2018 CLINICAL DATA:  Hypertension, previous stroke, coronary artery disease, left visual disturbance, diabetes, history  of left carotid stent/endarterectomy. EXAM: BILATERAL CAROTID DUPLEX ULTRASOUND TECHNIQUE: Pearline Cables scale imaging, color Doppler and duplex ultrasound were performed of bilateral carotid and vertebral arteries in the neck. COMPARISON:  None. FINDINGS: Criteria: Quantification of carotid stenosis is based on velocity parameters that correlate the residual internal carotid diameter with NASCET-based stenosis levels, using the diameter of the distal internal carotid lumen as the denominator for stenosis measurement. The following velocity measurements were obtained: RIGHT ICA: 132/55 cm/sec CCA: 07/37 cm/sec SYSTOLIC ICA/CCA RATIO:  1.4 ECA: 264 cm/sec LEFT ICA: 117/48 cm/sec CCA: 10/62 cm/sec SYSTOLIC ICA/CCA  RATIO:  1.5 ECA: 153 cm/sec RIGHT CAROTID ARTERY: Mild partially calcified plaque in the mid and distal common carotid artery, bulb, proximal ECA and ICA. There is at least mild stenosis in the proximal ECA. No high-grade ICA stenosis. Normal waveforms and color Doppler signal. RIGHT VERTEBRAL ARTERY:  Normal flow direction and waveform. LEFT CAROTID ARTERY: Patent stent from the distal common carotid artery across the bulb into the proximal ICA. No high-grade stenosis. Normal waveforms and color Doppler signal. Atheromatous plaque in mid and distal ICA. Continued antegrade flow into the external carotid artery. LEFT VERTEBRAL ARTERY:  Normal flow direction and waveform. IMPRESSION: 1. Right carotid bifurcation plaque resulting in less than 50% diameter ICA stenosis. 2. Patent left carotid stent without evidence of high-grade residual/recurrent stenosis. 3.  Antegrade bilateral vertebral arterial flow. Electronically Signed   By: Lucrezia Europe M.D.   On: 09/07/2018 08:42   Dg Foot 2 Views Right  Result Date: 09/05/2018 CLINICAL DATA:  53 y/o M; wound evaluation. 07/28/2018 calcaneal osteotomy and debridement. EXAM: RIGHT FOOT - 2 VIEW COMPARISON:  07/25/2018 right ankle radiographs. 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 Large dorsal calcaneal soft tissue post debridement, increased in size of soft tissue defect from prior CT of the ankle. Additionally, there is increased cortical lucency along the subjacent calcaneus. Vascular calcifications noted. IMPRESSION: Dorsal calcaneus soft tissue ulceration and subjacent bony cortical lucencies are increased in comparison with the prior CT of ankle which may represent progressive infection and/or interval postsurgical changes. Electronically Signed   By: Kristine Garbe M.D.   On: 09/05/2018 22:27   [place] Ekg Site Rite  Result Date: 09/09/2018 If Site Rite image not attached, placement could not be  confirmed due to current cardiac rhythm.    CBC Recent Labs  Lab 09/05/18 1754  09/07/18 0435 09/08/18 0325 09/09/18 0336 09/10/18 0448 09/11/18 0455  WBC 12.8*   < > 12.1* 12.5* 13.0* 13.7* 12.4*  HGB 7.7*   < > 7.4* 6.9* 8.7* 8.1* 8.2*  HCT 26.0*   < > 25.3* 23.4* 28.0* 26.0* 26.5*  PLT 327   < > 329 302 347 350 351  MCV 86.4   < > 86.6 85.7 84.3 84.7 84.9  MCH 25.6*   < > 25.3* 25.3* 26.2 26.4 26.3  MCHC 29.6*   < > 29.2* 29.5* 31.1 31.2 30.9  RDW 17.5*   < > 17.7* 17.8* 17.2* 17.5* 17.6*  LYMPHSABS 0.9  --   --   --   --   --   --   MONOABS 0.7  --   --   --   --   --   --   EOSABS 0.0  --   --   --   --   --   --   BASOSABS 0.0  --   --   --   --   --   --    < > =  values in this interval not displayed.    Chemistries  Recent Labs  Lab 09/05/18 1754  09/07/18 0435 09/08/18 0325 09/09/18 0336 09/10/18 0449 09/11/18 0455  NA 136   < > 140 136 135 136 138  K 3.7   < > 4.1 3.9 4.1 4.0 3.8  CL 103   < > 106 101 99 101 102  CO2 20*   < > 25 24 23 26 25   GLUCOSE 280*   < > 109* 130* 265* 273* 142*  BUN 60*   < > 53* 61* 66* 60* 61*  CREATININE 2.85*   < > 2.75* 2.78* 2.63* 2.35* 2.73*  CALCIUM 8.9   < > 8.6* 8.3* 8.4* 8.4* 8.8*  MG  --   --   --   --  1.7  --   --   AST 15  --   --   --   --   --   --   ALT 9  --   --   --   --   --   --   ALKPHOS 99  --   --   --   --   --   --   BILITOT 0.5  --   --   --   --   --   --    < > = values in this interval not displayed.   ------------------------------------------------------------------------------------------------------------------ estimated creatinine clearance is 35.7 mL/min (A) (by C-G formula based on SCr of 2.73 mg/dL (H)). ------------------------------------------------------------------------------------------------------------------ No results for input(s): HGBA1C in the last 72 hours. ------------------------------------------------------------------------------------------------------------------ No  results for input(s): CHOL, HDL, LDLCALC, TRIG, CHOLHDL, LDLDIRECT in the last 72 hours. ------------------------------------------------------------------------------------------------------------------ No results for input(s): TSH, T4TOTAL, T3FREE, THYROIDAB in the last 72 hours.  Invalid input(s): FREET3 ------------------------------------------------------------------------------------------------------------------ No results for input(s): VITAMINB12, FOLATE, FERRITIN, TIBC, IRON, RETICCTPCT in the last 72 hours.  Coagulation profile No results for input(s): INR, PROTIME in the last 168 hours.  No results for input(s): DDIMER in the last 72 hours.  Cardiac Enzymes No results for input(s): CKMB, TROPONINI, MYOGLOBIN in the last 168 hours.  Invalid input(s): CK ------------------------------------------------------------------------------------------------------------------ Invalid input(s): [place]  Patient is 53 year old with recurrent foot infection  1.  Acute osteomyelitis of right foot- leukocytosis improving -S/p excision of infected necrotic right Achilles tendon and excision of right posterior calcaneous 5/7 -S/p angiogram with angioplasty to R popliteal 5/8 -Continue vancomycin and ceftriaxone -ID following- will need IV abx for 4 weeks -Podiatry following-nonweightbearing on right, full weightbearing on left.  2.   HTN- blood pressure stable -Continue home antihypertensives  3. Normocytic anemia- likely iron deficiency and acute blood loss anemia. -s/p 1 unit PRBC 5/8, hgb stable after tranfusion  4. Type 2 diabetes-blood sugars improving -Lantus and SSI  5.   CAD-stable, no active chest pain  -Continue home meds  6.  Chronic systolic CHF-stable, no signs of acute exacerbation -Continue home meds  7. CKD stage IV-creatinine at baseline -Avoid nephrotoxic agents -Monitor  Plan for likely discharge home on IV antibiotics  tomorrow.      Code Status Orders  (From admission, onward)         Start     Ordered   09/06/18 0259  Full code  Continuous     09/06/18 0258        Code Status History    Date Active Date Inactive Code Status Order ID Comments User Context   07/25/2018 1717 08/02/2018 1919  Full Code [id number]  Dustin Flock, MD Inpatient   05/25/2018 0352 05/29/2018 2022 Full Code [id number]  Hillary Bow, MD ED   10/09/2016 1003 10/09/2016 1340 Full Code [id number]  Samara Deist, [facility] Inpatient   08/22/2016 0042 08/27/2016 1649 Full Code [id number]  Lance Coon, MD Inpatient   07/18/2014 1818 07/20/2014 2002 Full Code [id number]  Leighton Parody, PA-C Inpatient           Consults podiatry  DVT Prophylaxis  Lovenox    Lab Results  Component Value Date   PLT 351 09/11/2018     Time Spent in minutes 35 minutes greater than 50% of time spent in care coordination and counseling patient regarding the condition and plan of care.   Berna Spare Samayra Hebel M.D on 09/11/2018 at 11:21 AM  Between 7am to 6pm - Pager - [phone]  After 6pm go to [email or web address] - Proofreader  [employer]   Office  [phone]

## 2018-09-11 NOTE — Progress Notes (Signed)
Patient resting in bed, VSS, pain controlled with medication. Dressing clean dry and intact on right foot. A&O. No complaints at this time. Bed low, alarm on, call bell in reach.

## 2018-09-11 NOTE — Progress Notes (Signed)
[facility]  ROUNDING NOTE   Subjective:   Off IV fluids this morning. UOP 2161mL  Patient states that he has no complaints.   Objective:  Vital signs in last 24 hours:  Temp:  [97.4 F (36.3 C)-98.2 F (36.8 C)] 97.5 F (36.4 C) (05/10 0454) Pulse Rate:  [75-76] 75 (05/10 0454) Resp:  [16-18] 18 (05/10 0454) BP: (126-152)/(79-87) 126/79 (05/10 0454) SpO2:  [99 %-100 %] 100 % (05/10 0454)  Weight change:  Filed Weights   09/05/18 1752 09/06/18 0301 09/09/18 1520  Weight: 98.4 kg 97.5 kg 95.7 kg    Intake/Output: I/O last 3 completed shifts: In: 1404.3 [I.V.:1154.3; IV Piggyback:250] Out: 3125 [Urine:3125]   Intake/Output this shift:  Total I/O In: 100 [IV Piggyback:100] Out: 550 [Urine:550]  Physical Exam: General: NAD,   Head: Normocephalic, atraumatic. Moist oral mucosal membranes  Eyes: Anicteric, PERRL  Neck: Supple, trachea midline  Lungs:  Clear to auscultation  Heart: Regular rate and rhythm  Abdomen:  Soft, nontender,   Extremities:  bilateral feet with pressure boots, trace edema  Neurologic: Nonfocal, moving all four extremities  Skin: No lesions        Basic Metabolic Panel: Recent Labs  Lab 09/07/18 0435 09/08/18 0325 09/09/18 0336 09/10/18 0449 09/11/18 0455  NA 140 136 135 136 138  K 4.1 3.9 4.1 4.0 3.8  CL 106 101 99 101 102  CO2 25 24 23 26 25   GLUCOSE 109* 130* 265* 273* 142*  BUN 53* 61* 66* 60* 61*  CREATININE 2.75* 2.78* 2.63* 2.35* 2.73*  CALCIUM 8.6* 8.3* 8.4* 8.4* 8.8*  MG  --   --  1.7  --   --   PHOS  --   --   --  4.0 4.2    Liver Function Tests: Recent Labs  Lab 09/05/18 1754 09/10/18 0449 09/11/18 0455  AST 15  --   --   ALT 9  --   --   ALKPHOS 99  --   --   BILITOT 0.5  --   --   PROT 7.3  --   --   ALBUMIN 3.5 2.9* 2.9*   No results for input(s): LIPASE, AMYLASE in the last 168 hours. No results for input(s): AMMONIA in the last 168 hours.  CBC: Recent Labs  Lab 09/05/18 1754   09/07/18 0435 09/08/18 0325 09/09/18 0336 09/10/18 0448 09/11/18 0455  WBC 12.8*   < > 12.1* 12.5* 13.0* 13.7* 12.4*  NEUTROABS 11.1*  --   --   --   --   --   --   HGB 7.7*   < > 7.4* 6.9* 8.7* 8.1* 8.2*  HCT 26.0*   < > 25.3* 23.4* 28.0* 26.0* 26.5*  MCV 86.4   < > 86.6 85.7 84.3 84.7 84.9  PLT 327   < > 329 302 347 350 351   < > = values in this interval not displayed.    Cardiac Enzymes: No results for input(s): CKTOTAL, CKMB, CKMBINDEX, TROPONINI in the last 168 hours.  BNP: Invalid input(s): POCBNP  CBG: Recent Labs  Lab 09/10/18 1144 09/10/18 1703 09/10/18 2129 09/11/18 0752 09/11/18 1129  GLUCAP 215* 206* 195* 108* 108*    Microbiology: Results for orders placed or performed during the hospital encounter of 09/05/18  SARS Coronavirus 2 (CEPHEID - Performed in [facility] hospital lab), Hosp Order     Status: None   Collection Time: 09/05/18 11:49 PM  Result Value Ref Range Status  SARS Coronavirus 2 NEGATIVE NEGATIVE Final    Comment: (NOTE) If result is NEGATIVE SARS-CoV-2 target nucleic acids are NOT DETECTED. The SARS-CoV-2 RNA is generally detectable in upper and lower  respiratory specimens during the acute phase of infection. The lowest  concentration of SARS-CoV-2 viral copies this assay can detect is 250  copies / mL. A negative result does not preclude SARS-CoV-2 infection  and should not be used as the sole basis for treatment or other  patient management decisions.  A negative result may occur with  improper specimen collection / handling, submission of specimen other  than nasopharyngeal swab, presence of viral mutation(s) within the  areas targeted by this assay, and inadequate number of viral copies  (<250 copies / mL). A negative result must be combined with clinical  observations, patient history, and epidemiological information. If result is POSITIVE SARS-CoV-2 target nucleic acids are DETECTED. The SARS-CoV-2 RNA is generally detectable  in upper and lower  respiratory specimens dur ing the acute phase of infection.  Positive  results are indicative of active infection with SARS-CoV-2.  Clinical  correlation with patient history and other diagnostic information is  necessary to determine patient infection status.  Positive results do  not rule out bacterial infection or co-infection with other viruses. If result is PRESUMPTIVE POSTIVE SARS-CoV-2 nucleic acids MAY BE PRESENT.   A presumptive positive result was obtained on the submitted specimen  and confirmed on repeat testing.  While 2019 novel coronavirus  (SARS-CoV-2) nucleic acids may be present in the submitted sample  additional confirmatory testing may be necessary for epidemiological  and / or clinical management purposes  to differentiate between  SARS-CoV-2 and other Sarbecovirus currently known to infect humans.  If clinically indicated additional testing with an alternate test  methodology [phone]) is advised. The SARS-CoV-2 RNA is generally  detectable in upper and lower respiratory sp ecimens during the acute  phase of infection. The expected result is Negative. Fact Sheet for Patients:  StrictlyIdeas.no Fact Sheet for Healthcare Providers: BankingDealers.co.za This test is not yet approved or cleared by the [place] FDA and has been authorized for detection and/or diagnosis of SARS-CoV-2 by FDA under an Emergency Use Authorization (EUA).  This EUA will remain in effect (meaning this test can be used) for the duration of the COVID-19 declaration under Section 564(b)(1) of the Act, 21 U.S.C. section 360bbb-3(b)(1), unless the authorization is terminated or revoked sooner. Performed at [facility], [address] [place], [place] [address]   MRSA PCR Screening     Status: Abnormal   Collection Time: 09/06/18  4:35 AM  Result Value Ref Range Status   MRSA by PCR POSITIVE (A) NEGATIVE Final     Comment:        The GeneXpert MRSA Assay (FDA approved for NASAL specimens only), is one component of a comprehensive MRSA colonization surveillance program. It is not intended to diagnose MRSA infection nor to guide or monitor treatment for MRSA infections. RESULT CALLED TO, READ BACK BY AND VERIFIED WITH: JENNIFER COLE @0812  ON 09/06/2018 BY FMW Performed at [facility], [address] [place], [place] [address]   Aerobic/Anaerobic Culture (surgical/deep wound)     Status: None (Preliminary result)   Collection Time: 09/08/18  2:25 PM  Result Value Ref Range Status   Specimen Description   Final    TISSUE ACHILLES TENDON SOFT TISSUE Performed at [facility], [address], [place], [place] [address]    Special Requests  Final    NONE Performed at [facility], [place], [place] [address]    Gram Stain   Final    MODERATE WBC PRESENT,BOTH PMN AND MONONUCLEAR FEW GRAM POSITIVE COCCI    Culture   Final    ABUNDANT METHICILLIN RESISTANT STAPHYLOCOCCUS AUREUS HOLDING FOR POSSIBLE ANAEROBE Performed at [facility], [place] [address], [place],  [address]    Report Status PENDING  Incomplete   Organism ID, Bacteria METHICILLIN RESISTANT STAPHYLOCOCCUS AUREUS  Final      Susceptibility   Methicillin resistant staphylococcus aureus - MIC*    CIPROFLOXACIN >=8 RESISTANT Resistant     ERYTHROMYCIN >=8 RESISTANT Resistant     GENTAMICIN <=0.5 SENSITIVE Sensitive     OXACILLIN >=4 RESISTANT Resistant     TETRACYCLINE <=1 SENSITIVE Sensitive     VANCOMYCIN 1 SENSITIVE Sensitive     TRIMETH/SULFA >=320 RESISTANT Resistant     CLINDAMYCIN <=0.25 SENSITIVE Sensitive     RIFAMPIN <=0.5 SENSITIVE Sensitive     Inducible Clindamycin NEGATIVE Sensitive     * ABUNDANT METHICILLIN RESISTANT STAPHYLOCOCCUS AUREUS    Coagulation Studies: No results for input(s): LABPROT, INR in the last 72 hours.  Urinalysis: No results  for input(s): COLORURINE, LABSPEC, PHURINE, GLUCOSEU, HGBUR, BILIRUBINUR, KETONESUR, PROTEINUR, UROBILINOGEN, NITRITE, LEUKOCYTESUR in the last 72 hours.  Invalid input(s): APPERANCEUR    Imaging: No results found.   Medications:   . sodium chloride 0 mL/hr at 09/06/18 1459  . sodium chloride    . cefTRIAXone (ROCEPHIN)  IV Stopped (09/10/18 2218)  . [START ON 09/13/2018] vancomycin     . amiodarone  200 mg Oral BID  . aspirin EC  81 mg Oral Daily  . carvedilol  3.125 mg Oral BID  . clopidogrel  75 mg Oral Q breakfast  . gabapentin  300 mg Oral TID  . heparin  5,000 Units Subcutaneous Q8H  . insulin aspart  0-5 Units Subcutaneous QHS  . insulin aspart  0-9 Units Subcutaneous TID WC  . insulin glargine  20 Units Subcutaneous q morning - 10a  . multivitamin-lutein  1 capsule Oral Daily  . mupirocin ointment   Nasal BID  . predniSONE  5 mg Oral Q breakfast  . sodium chloride flush  3 mL Intravenous Q12H  . torsemide  40 mg Oral q morning - 10a   And  . torsemide  20 mg Oral QPM   sodium chloride, sodium chloride, acetaminophen **OR** acetaminophen, morphine injection, ondansetron **OR** ondansetron (ZOFRAN) IV, ondansetron (ZOFRAN) IV, ondansetron (ZOFRAN) IV, oxyCODONE, sodium chloride flush  Assessment/ Plan:  [name] is a 53 y.o. black male  diabetes mellitus type II, hypertension,coronary artery disease, congestive heart failure, ICD, carotid stenosis, CVA, obstructive sleep apnea, peripheral vascular disease, gout,who was admitted to [facility] on[facility]/08/2018 for Wound infection [T14.8XXA, L08.9] Other acute osteomyelitis of right foot ([place]) [M86.171] Type 2 diabetes mellitus with other specified complication, unspecified whether long term insulin use ([place]) [E11.69]  1. Chronic kidney disease stage IV with proteinuria:  Chronic kidney disease secondary to diabetic nephropathy - not currently on an ACE-I/ARB -will consider outpatient once serum creatinine is  considered to be stable - Outpatient follow up with Nephrology.   2. Right heel osteomyelitis: status post debridement on 5/7 by Dr. Elvina Mattes and Angiogram on 5/8 by Dr. Delana Meyer - Vanco and ceftriaxone - Appreciate vascular, ID and podiatry input. - Not a candidate for a long term PICC due to renal insufficiency.  3. Hypertension: blood pressure at goal.  - carvedilol and torsemide.  4. Anemia of chronic kidney disease:Hemoglobin 8.2. Normocytic.  Discussed ESA therapy as outpatient.  Continue to monitor.  5. Diabetes mellitus type II with chronic kidney disease: insulin dependent. Hemoglobin A1c of 7.9%.  Complication of diabetic foot ulcer with osteomyelitis.   LOS: 5 Kaisha Wachob 5/10/20202:21 PM

## 2018-09-11 NOTE — Progress Notes (Signed)
Physical Therapy Treatment Patient Details Name: Paul [name]. MRN: [id number] DOB: 14-Aug-1965 Today's Date: 09/11/2018    History of Present Illness Paul [name] is a 53yo Male who comes [facility] on 5/5 c worsening Right heel wound pain and redness, found to have  osteomyelitis of the calcaneus and achilles tendon: debridement of same area back in March. PMH: cervical myelopathy, OSA, cervical radiculopahty with bial thand flexion neurogenic weaknes, CHF, CKD, CAD s/p CABGx3, DM, R transmet amputation, Rt hip surgery. Pt underwent surgical debridement/resection of insertional Rt achilles tendon and resection of Rt calcaneus on 5/7. On 5/8 pt underwent TPA of the Rt poplietal artery, and Rt IJ cath placement for IV ABX at DC.     PT Comments    Patient in bed and eager to participate in physical therapy on PT arrival. Patient able to articulate precautions and weight bearing status when asked. Patient demonstrated safe mechanics for STS with prevalon booty on RLE; patient continues to have limited TDWB on RLE with STS, but immediately transitions to NWB in standing for stand pivot transfer. Patient required SBA/Mod I for transfers with no LOB or indication for unsteadiness. Patient eager to review and demonstrate general BLE strengthening while in bed and chair. Patient will continue to benefit from skilled therapeutic intervention to progress toward his PLOF.   Patient in chair with all needs in reach/met.   Follow Up Recommendations  Home health PT;Supervision for mobility/OOB     Equipment Recommendations  None recommended by PT    Recommendations for Other Services       Precautions / Restrictions Precautions Precautions: Fall Precaution Comments: pt refuses chair alarm Required Braces or Orthoses: (prevalon boots when in bed ) Restrictions RLE Weight Bearing: Non weight bearing LLE Weight Bearing: Weight bearing as tolerated    Mobility  Bed Mobility Overal bed mobility:  Independent                Transfers Overall transfer level: Needs assistance Equipment used: Rolling walker (2 wheeled) Transfers: Sit to/from [facility] Sit to Stand: Supervision;Modified independent (Device/Increase time) Stand pivot transfers: Supervision;Modified independent (Device/Increase time)       General transfer comment: Patient kept prevalon boot on RLE during transfer to encourage maintenance of WB precautions; patient continues to perform with TDWB.  Ambulation/Gait Ambulation/Gait assistance: (MD orders for transfer training only at this time. )               Stairs             Wheelchair Mobility    Modified Rankin (Stroke Patients Only)       Balance Overall balance assessment: Modified Independent;Mild deficits observed, not formally tested                                          Cognition Arousal/Alertness: Awake/alert Behavior During Therapy: WFL for tasks assessed/performed Overall Cognitive Status: Within Functional Limits for tasks assessed                                        Exercises Other Exercises Other Exercises: Bed exercises for strength maintenance Other Exercises: Precautions review and assessment of understanding.    General Comments        Pertinent Vitals/Pain Pain Assessment: Faces Faces  Pain Scale: No hurt    Home Living                      Prior Function            PT Goals (current goals can now be found in the care plan section) Acute Rehab PT Goals Patient Stated Goal: pt would like to be as mobile as possible, but understands limitations from MD PT Goal Formulation: With patient Time For Goal Achievement: 09/24/18 Potential to Achieve Goals: Good Progress towards PT goals: Progressing toward goals    Frequency    7X/week      PT Plan Current plan remains appropriate    Co-evaluation              AM-PAC PT "6  Clicks" Mobility   Outcome Measure  Help needed turning from your back to your side while in a flat bed without using bedrails?: None Help needed moving from lying on your back to sitting on the side of a flat bed without using bedrails?: None Help needed moving to and from a bed to a chair (including a wheelchair)?: A Little Help needed standing up from a chair using your arms (e.g., wheelchair or bedside chair)?: None Help needed to walk in hospital room?: A Little Help needed climbing 3-5 steps with a railing? : A Lot 6 Click Score: 20    End of Session Equipment Utilized During Treatment: Gait belt(prevalon booties; Patient refuses gait belt (politely)) Activity Tolerance: Patient tolerated treatment well Patient left: in chair;with call bell/phone within reach(chair alarm refused) Nurse Communication: Mobility status PT Visit Diagnosis: Unsteadiness on feet (R26.81);Other abnormalities of gait and mobility (R26.89);Difficulty in walking, not elsewhere classified (R26.2);Muscle weakness (generalized) (M62.81)     Time: 3212-2482 PT Time Calculation (min) (ACUTE ONLY): 33 min  Charges:  $Therapeutic Exercise: 8-22 mins $Therapeutic Activity: 8-22 mins                    Myles Gip PT, DPT [phone] 09/11/2018, 4:07 PM

## 2018-09-12 ENCOUNTER — Encounter: Payer: Self-pay | Admitting: Vascular Surgery

## 2018-09-12 DIAGNOSIS — M869 Osteomyelitis, unspecified: Secondary | ICD-10-CM

## 2018-09-12 DIAGNOSIS — B9562 Methicillin resistant Staphylococcus aureus infection as the cause of diseases classified elsewhere: Secondary | ICD-10-CM

## 2018-09-12 DIAGNOSIS — Z9862 Peripheral vascular angioplasty status: Secondary | ICD-10-CM

## 2018-09-12 DIAGNOSIS — B9689 Other specified bacterial agents as the cause of diseases classified elsewhere: Secondary | ICD-10-CM

## 2018-09-12 LAB — GLUCOSE, CAPILLARY
Glucose-Capillary: 140 mg/dL — ABNORMAL HIGH (ref 70–99)
Glucose-Capillary: 131 mg/dL — ABNORMAL HIGH (ref 70–99)
Glucose-Capillary: 128 mg/dL — ABNORMAL HIGH (ref 70–99)

## 2018-09-12 LAB — SURGICAL PATHOLOGY

## 2018-09-12 LAB — VANCOMYCIN, RANDOM: Vancomycin Rm: 18

## 2018-09-12 LAB — CK: Total CK: 48 U/L — ABNORMAL LOW (ref 49–397)

## 2018-09-12 LAB — C-REACTIVE PROTEIN: CRP: 2 mg/dL — ABNORMAL HIGH (ref <1.0)

## 2018-09-12 MED ORDER — METRONIDAZOLE 500 MG PO TABS
500.0000 mg | ORAL_TABLET | Freq: Two times a day (BID) | ORAL | Status: DC
  Administered 2018-09-12: 500 mg via ORAL
  Filled 2018-09-12: qty 1

## 2018-09-12 MED ORDER — VANCOMYCIN HCL IN DEXTROSE 1-5 GM/200ML-% IV SOLN
1000.0000 mg | INTRAVENOUS | Status: DC
  Filled 2018-09-12: qty 200

## 2018-09-12 MED ORDER — SODIUM CHLORIDE 0.9 % IV SOLN
750.0000 mg | Freq: Every day | INTRAVENOUS | Status: DC
  Administered 2018-09-12: 750 mg via INTRAVENOUS
  Filled 2018-09-12: qty 15

## 2018-09-12 MED ORDER — CLOPIDOGREL BISULFATE 75 MG PO TABS: 75.0000 mg | ORAL_TABLET | Freq: Every day | ORAL | 0 refills | Status: DC

## 2018-09-12 MED ORDER — CEFTRIAXONE IV (FOR PTA / DISCHARGE USE ONLY): 2.0000 g | INTRAVENOUS | 0 refills | Status: DC

## 2018-09-12 MED ORDER — OXYCODONE HCL 5 MG PO TABS: 5.0000 mg | ORAL_TABLET | ORAL | 0 refills | Status: DC | PRN

## 2018-09-12 MED ORDER — DAPTOMYCIN IV (FOR PTA / DISCHARGE USE ONLY): 750.0000 mg | INTRAVENOUS | 0 refills | Status: DC

## 2018-09-12 MED ORDER — METRONIDAZOLE 500 MG PO TABS: 500.0000 mg | ORAL_TABLET | Freq: Two times a day (BID) | ORAL | 0 refills | Status: DC

## 2018-09-12 NOTE — Discharge Summary (Addendum)
[facility] NAME: Paul [name]    MR#:  [id number]  DATE OF BIRTH:  12-Oct-1965  DATE OF ADMISSION:  09/05/2018   ADMITTING PHYSICIAN: Lance Coon, MD  DATE OF DISCHARGE: 09/12/18  PRIMARY CARE PHYSICIAN: Tracie Harrier, MD   ADMISSION DIAGNOSIS:  Wound infection [T14.8XXA, L08.9] Other acute osteomyelitis of right foot ([place]) [M86.171] Type 2 diabetes mellitus with other specified complication, unspecified whether long term insulin use ([place]) [E11.69] DISCHARGE DIAGNOSIS:  Principal Problem:   Acute osteomyelitis of right foot ([place]) Active Problems:   HTN (hypertension)   Diabetes ([place])   CAD (coronary artery disease)   Chronic systolic CHF (congestive heart failure) (HCC)   CKD (chronic kidney disease), stage IV (HCC)   Acute osteomyelitis of right ankle or foot (HCC)   Pressure ulcer of heel, right, unstageable ([place])  SECONDARY DIAGNOSIS:   Past Medical History:  Diagnosis Date  . Anemia   . Cardiac defibrillator in place    a. Biotronik LUmax 540 DRT, (ser # [id number]).  . Carotid arterial disease ([place])    a. s/p prior LICA stenting;  b. 05/930 Carotid U/S: 40-59% bilat ICA stenosis. Patent LICA stent.  . CHF (congestive heart failure) ([place])   . CKD (chronic kidney disease), stage III ([place])   . Coronary artery disease    a. 2010 s/p CABG x 3.  . DDD (degenerative disc disease), lumbosacral    L5-S1  . Diabetes ([place])    Lantus at bedtime  . Gangrene of toe of right foot ([place])   . Gout of left hand 10/06/2016  . HFrEF (heart failure with reduced ejection fraction) ([place])    a. 07/2016 Echo: EF 25-30%, diff HK, mild MR, mildly dil LA, mod reduced RV fxn, PASP 22mHg.  .Marland KitchenHypertension    takes Coreg daily  . Ischemic cardiomyopathy    a. 07/2016 Echo: EF 25-30%, diff HK.  . Myocardial infarction ([place] 2009  . Peripheral vascular disease ([place]   . Sleep apnea    sleep study yr ago-unable to afford cpap  . Stroke  ([facility] 09   no weakness   HOSPITAL COURSE:   JPaxsonis a 53year old male who presented to the ED with worsening pain in his right heel.  He had a known diabetic foot wound for which he had been seeing podiatry.  CT scan in the ED showed osteomyelitis.  He was started on antibiotics and was admitted for further management.  1.  Acute osteomyelitis of right calcaneous -S/p excision of infected necrotic right Achilles tendon and excision of right posterior calcaneous 5/7 by podiatry -S/p angiogram with angioplasty to R popliteal 5/8 by vascular surgery -Vascular surgery placed PermCath for IV antibiotics (unable to place PICC due to CKD) -ID consulted- recommended the following antibiotics at discharge:  -Daptomycin '8mg'$ /KG IV every 24 hours- '750mg'$  every 24 hours for 4 weeks until 10/10/18  -Ceftriaxone 2 grams Iv every 24 hrs for 4 weeks until 10/10/18  -Flagyl PO '500mg'$  BID for 4 weeks until 10/10/18 -Patient will need the following labs while on IV antibiotics:  -Labs every Monday while on IV antibiotics: CBC with differential, CMP, CK  -Labs every other Monday while on IV antibiotics: CRP, ESR -Podiatry discharge recommendations:  -He will need to continue to stay nonweightbearing on the right foot when he goes home.  He will need home health to change his dressings on both wounds at least every other day.  On the  left heel I would suggest calcium alginate along with a dry gauze dressing with lots of padding to the region.  On the right heel I would recommend a dry dressing with heavy gauze padding both areas should use Kerlix as an external wrap along with multiple gauze pads directly on the wounds.  He should have on his heel protector boots at all times when he is in bed and make sure nothing is putting pressure on either heel. -Needs to f/u with podiatry in 1 week and with ID in 1-2 weeks -Discharge home with home health RN, PT, aide  2. HTN- blood pressure stable -Continued home  antihypertensives  3. Normocytic anemia- likely iron deficiency and acute blood loss anemia. -s/p 1 unit PRBC 5/8, hgb stable after tranfusion  4. Type 2 diabetes -Home diabetes medications restarted on discharge  5. CAD-stable, no active chest pain  -Continued home meds  6.Chronic systolic CHF-stable, no signs of acute exacerbation -Continued home meds  7. CKD stage IV- creatinine at baseline -Avoid nephrotoxic agents -Monitor -Needs to f/u with nephrology as an outpatient  8. Paroxysmal atrial fibrillation- in NSR here -Continue amiodarone -Patient previously prescribed eliquis, but never filled the prescription due to expense -Discharged home on aspirin and plavix for PAD -Needs anticoagulation re-addressed as an outpatient  DISCHARGE CONDITIONS:  Acute osteomyelitis of right calcaneus Hypertension Normocytic anemia Type 2 diabetes CAD Chronic systolic CHF CKD 4 Paroxysmal atrial fibrillation CONSULTS OBTAINED:  Infectious disease Podiatry Nephrology DRUG ALLERGIES:   Allergies  Allergen Reactions  . Other Other (See Comments)    Cardiac Problems. Pt states he tolerates Toradol. Due to kidney and heart problems per pt  . Ibuprofen Other (See Comments)    Heart problems  . Baclofen Other (See Comments)  . Metformin Diarrhea  . Nsaids     Due to kidney and heart problems per pt   DISCHARGE MEDICATIONS:   Allergies as of 09/12/2018      Reactions   Other Other (See Comments)   Cardiac Problems. Pt states he tolerates Toradol. Due to kidney and heart problems per pt   Ibuprofen Other (See Comments)   Heart problems   Baclofen Other (See Comments)   Metformin Diarrhea   Nsaids    Due to kidney and heart problems per pt      Medication List    STOP taking these medications   apixaban 5 MG Tabs tablet Commonly known as:  ELIQUIS     TAKE these medications   acetaminophen 500 MG tablet Commonly known as:  TYLENOL Take 500 mg by mouth  every 6 (six) hours as needed.   acetaminophen 325 MG tablet Commonly known as:  TYLENOL Take 2 tablets (650 mg total) by mouth every 6 (six) hours as needed for mild pain (or Fever >/= 101).   allopurinol 100 MG tablet Commonly known as:  ZYLOPRIM Take 1 tablet (100 mg total) by mouth daily.   amiodarone 200 MG tablet Commonly known as:  PACERONE Take 200 mg by mouth daily. Take 200 mg twice a day for ten days, then take 200 mg once a day thereafter.   aspirin EC 81 MG tablet Take 1 tablet (81 mg total) by mouth daily.   carvedilol 3.125 MG tablet Commonly known as:  COREG Take 1 tablet (3.125 mg total) by mouth 2 (two) times daily.   cefTRIAXone  IVPB Commonly known as:  ROCEPHIN Inject 2 g into the vein daily for 27 days. Indication:  Diabetic foot  with osteomyelitis Last Day of Therapy:  10/10/18 Labs weekly on monday while on IV antibiotics:  _X_ CBC with differential X CMP X CPK Labs every other Monday while on antibiotic _X__ CRP _X_ ESR Start taking on:  Sep 13, 2018   clopidogrel 75 MG tablet Commonly known as:  PLAVIX Take 1 tablet (75 mg total) by mouth daily with breakfast.   daptomycin  IVPB Commonly known as:  CUBICIN Inject 750 mg into the vein daily for 27 days. Indication:  Diabetic foot with osteomyelitis Last Day of Therapy:  10/10/18 Labs weekly on monday while on IV antibiotics:  _X_ CBC with differential X CMP X CPK Labs every other Monday while on antibiotic _X__ CRP _X_ ESR Start taking on:  Sep 13, 2018   gabapentin 100 MG capsule Commonly known as:  NEURONTIN Take 200 mg by mouth 3 (three) times daily.   insulin glargine 100 unit/mL Sopn Commonly known as:  LANTUS Inject 0.2 mLs (20 Units total) into the skin at bedtime.   metroNIDAZOLE 500 MG tablet Commonly known as:  Flagyl Take 1 tablet (500 mg total) by mouth 2 (two) times daily for 28 days.   oxyCODONE 5 MG immediate release tablet Commonly known as:  Oxy IR/ROXICODONE Take  1 tablet (5 mg total) by mouth every 4 (four) hours as needed for moderate pain.   predniSONE 5 MG tablet Commonly known as:  DELTASONE Take 1 tablet (5 mg total) by mouth daily with breakfast.   torsemide 20 MG tablet Commonly known as:  DEMADEX Take 20-40 mg by mouth 2 (two) times daily. Take 40 mg (two tablets) in the morning and 20 mg (1 tablet) in the evening.   Vitamin D3 1.25 MG (50000 UT) Caps Take 1 capsule by mouth once a week.            Home Infusion Instuctions  (From admission, onward)         Start     Ordered   09/12/18 0000  Home infusion instructions [facility] May follow [facility] Dosing Protocol; May administer Cathflo as needed to maintain patency of vascular access device.; Flushing of vascular access device: per [facility] Protocol: 0.9% NaCl pre/post medica...    Question Answer Comment  Instructions May follow [place] Dosing Protocol   Instructions May administer Cathflo as needed to maintain patency of vascular access device.   Instructions Flushing of vascular access device: per [facility] Protocol: 0.9% NaCl pre/post medication administration and prn patency; Heparin 100 u/ml, 52m for implanted ports and Heparin 10u/ml, 568mfor all other central venous catheters.   Instructions May follow [facility] Anaphylaxis Protocol for First Dose Administration in the home: 0.9% NaCl at 25-50 ml/hr to maintain IV access for protocol meds. Epinephrine 0.3 ml IV/IM PRN and Benadryl 25-50 IV/IM PRN s/s of anaphylaxis.   Instructions [facility] Infusion Coordinator (RN) to assist per patient IV care needs in the home PRN.      09/12/18 1435           DISCHARGE INSTRUCTIONS:  1.  Follow-up with PCP in 5 days 2.  Follow-up with podiatry in 1 week 3.  Follow-up with infectious disease in 1 to 2 weeks 4.  Take antibiotics as prescribed 5.  Started on Plavix 75 mg daily this admission DIET:  Cardiac and diabetic diet DISCHARGE CONDITION:  Stable ACTIVITY:   Activity as tolerated OXYGEN:  Home Oxygen: No.  Oxygen Delivery: room air DISCHARGE LOCATION:  home  If you experience worsening of your admission symptoms, develop shortness of breath, life threatening emergency, suicidal or homicidal thoughts you must seek medical attention immediately by calling 911 or calling your MD immediately  if symptoms less severe.  You Must read complete instructions/literature along with all the possible adverse reactions/side effects for all the Medicines you take and that have been prescribed to you. Take any new Medicines after you have completely understood and accpet all the possible adverse reactions/side effects.   Please note  You were cared for by a hospitalist during your hospital stay. If you have any questions about your discharge medications or the care you received while you were in the hospital after you are discharged, you can call the unit and asked to speak with the hospitalist on call if the hospitalist that took care of you is not available. Once you are discharged, your primary care physician will handle any further medical issues. Please note that NO REFILLS for any discharge medications will be authorized once you are discharged, as it is imperative that you return to your primary care physician (or establish a relationship with a primary care physician if you do not have one) for your aftercare needs so that they can reassess your need for medications and monitor your lab values.    On the day of Discharge:  VITAL SIGNS:  Blood pressure 128/79, pulse 82, temperature 98.3 F (36.8 C), resp. rate 16, height '5\' 9"'$  (1.753 m), weight 95.7 kg, SpO2 98 %. PHYSICAL EXAMINATION:  GENERAL: Pleasant-appearing in no apparent distress.  HEENT: Atraumatic, normocephalic. Extraocular muscles are intact. Pupils equal and reactive to light. Sclerae anicteric. No conjunctival injection. No oro-pharyngeal erythema.  NECK: Supple. There is no jugular venous  distention. No bruits, no lymphadenopathy, no thyromegaly. +Right IJ catheter in place HEART: Regular rate and rhythm,. No murmurs, no rubs, no clicks.  LUNGS: Clear to auscultation bilaterally. No rales or rhonchi. No wheezes.  ABDOMEN: Soft, flat, nontender, nondistended. Has good bowel sounds. No hepatosplenomegaly appreciated.  EXTREMITIES: Dressing and protector boots in place over both feet.  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 NEUROLOGIC: The patient is alert, awake, and oriented x3 with no focal motor or sensory deficits appreciated bilaterally.  SKIN: Both feet dressing and boots in place.  Psych: Not anxious, depressed DATA REVIEW:   CBC Recent Labs  Lab 09/11/18 0455  WBC 12.4*  HGB 8.2*  HCT 26.5*  PLT 351    Chemistries  Recent Labs  Lab 09/05/18 1754  09/09/18 0336  09/11/18 0455  NA 136   < > 135   < > 138  K 3.7   < > 4.1   < > 3.8  CL 103   < > 99   < > 102  CO2 20*   < > 23   < > 25  GLUCOSE 280*   < > 265*   < > 142*  BUN 60*   < > 66*   < > 61*  CREATININE 2.85*   < > 2.63*   < > 2.73*  CALCIUM 8.9   < > 8.4*   < > 8.8*  MG  --   --  1.7  --   --   AST 15  --   --   --   --   ALT 9  --   --   --   --   ALKPHOS 99  --   --   --   --  BILITOT 0.5  --   --   --   --    < > = values in this interval not displayed.     Microbiology Results  Results for orders placed or performed during the hospital encounter of 09/05/18  SARS Coronavirus 2 (CEPHEID - Performed in [place] hospital lab), Hosp Order     Status: None   Collection Time: 09/05/18 11:49 PM  Result Value Ref Range Status   SARS Coronavirus 2 NEGATIVE NEGATIVE Final    Comment: (NOTE) If result is NEGATIVE SARS-CoV-2 target nucleic acids are NOT DETECTED. The SARS-CoV-2 RNA is generally detectable in upper and lower  respiratory specimens during the acute phase of infection. The lowest  concentration of SARS-CoV-2 viral copies this assay can detect is 250  copies / mL. A negative  result does not preclude SARS-CoV-2 infection  and should not be used as the sole basis for treatment or other  patient management decisions.  A negative result may occur with  improper specimen collection / handling, submission of specimen other  than nasopharyngeal swab, presence of viral mutation(s) within the  areas targeted by this assay, and inadequate number of viral copies  (<250 copies / mL). A negative result must be combined with clinical  observations, patient history, and epidemiological information. If result is POSITIVE SARS-CoV-2 target nucleic acids are DETECTED. The SARS-CoV-2 RNA is generally detectable in upper and lower  respiratory specimens dur ing the acute phase of infection.  Positive  results are indicative of active infection with SARS-CoV-2.  Clinical  correlation with patient history and other diagnostic information is  necessary to determine patient infection status.  Positive results do  not rule out bacterial infection or co-infection with other viruses. If result is PRESUMPTIVE POSTIVE SARS-CoV-2 nucleic acids MAY BE PRESENT.   A presumptive positive result was obtained on the submitted specimen  and confirmed on repeat testing.  While 2019 novel coronavirus  (SARS-CoV-2) nucleic acids may be present in the submitted sample  additional confirmatory testing may be necessary for epidemiological  and / or clinical management purposes  to differentiate between  SARS-CoV-2 and other Sarbecovirus currently known to infect humans.  If clinically indicated additional testing with an alternate test  methodology [phone]) is advised. The SARS-CoV-2 RNA is generally  detectable in upper and lower respiratory sp ecimens during the acute  phase of infection. The expected result is Negative. Fact Sheet for Patients:  StrictlyIdeas.no Fact Sheet for Healthcare Providers: BankingDealers.co.za This test is not yet  approved or cleared by the [place] FDA and has been authorized for detection and/or diagnosis of SARS-CoV-2 by FDA under an Emergency Use Authorization (EUA).  This EUA will remain in effect (meaning this test can be used) for the duration of the COVID-19 declaration under Section 564(b)(1) of the Act, 21 U.S.C. section 360bbb-3(b)(1), unless the authorization is terminated or revoked sooner. Performed at [facility], [address] [place], [place] [address]   MRSA PCR Screening     Status: Abnormal   Collection Time: 09/06/18  4:35 AM  Result Value Ref Range Status   MRSA by PCR POSITIVE (A) NEGATIVE Final    Comment:        The GeneXpert MRSA Assay (FDA approved for NASAL specimens only), is one component of a comprehensive MRSA colonization surveillance program. It is not intended to diagnose MRSA infection nor to guide or monitor treatment for MRSA infections. RESULT CALLED TO, READ BACK BY AND VERIFIED WITH:  JENNIFER COLE _0  ON 09/06/2018 BY FMW Performed at [facility], [address] [place], [place] [address]   Aerobic/Anaerobic Culture (surgical/deep wound)     Status: None (Preliminary result)   Collection Time: 09/08/18  2:25 PM  Result Value Ref Range Status   Specimen Description   Final    TISSUE ACHILLES TENDON SOFT TISSUE Performed at [facility], [address] [place], [place] [address]    Special Requests   Final    NONE Performed at [facility], [address] [place], [place] [address]    Gram Stain   Final    MODERATE WBC PRESENT,BOTH PMN AND MONONUCLEAR FEW GRAM POSITIVE COCCI Performed at [facility], [place] [address] [place], [place] [address]    Culture   Final    ABUNDANT METHICILLIN RESISTANT STAPHYLOCOCCUS AUREUS FEW PREVOTELLA BIVIA BETA LACTAMASE POSITIVE FEW ENTEROCOCCUS FAECALIS    Report Status PENDING  Incomplete   Organism ID, Bacteria METHICILLIN RESISTANT  STAPHYLOCOCCUS AUREUS  Final      Susceptibility   Methicillin resistant staphylococcus aureus - MIC*    CIPROFLOXACIN >=8 RESISTANT Resistant     ERYTHROMYCIN >=8 RESISTANT Resistant     GENTAMICIN <=0.5 SENSITIVE Sensitive     OXACILLIN >=4 RESISTANT Resistant     TETRACYCLINE <=1 SENSITIVE Sensitive     VANCOMYCIN 1 SENSITIVE Sensitive     TRIMETH/SULFA >=320 RESISTANT Resistant     CLINDAMYCIN <=0.25 SENSITIVE Sensitive     RIFAMPIN <=0.5 SENSITIVE Sensitive     Inducible Clindamycin NEGATIVE Sensitive     * ABUNDANT METHICILLIN RESISTANT STAPHYLOCOCCUS AUREUS    RADIOLOGY:  No results found.   Management plans discussed with the patient, family and they are in agreement.  CODE STATUS: Full Code   TOTAL TIME TAKING CARE OF THIS PATIENT: 50 minutes.    Berna Spare Mayo M.D on 09/12/2018 at 3:05 PM  Between 7am to 6pm - Pager - [phone]  After 6pm go to [email or web address] - Proofreader  [facility]  Office  [phone]  CC: Primary care physician; Tracie Harrier, MD   Note: This dictation was prepared with Dragon dictation along with smaller phrase technology. Any transcriptional errors that result from this process are unintentional.

## 2018-09-12 NOTE — Progress Notes (Signed)
Physical Therapy Treatment Patient Details Name: Paul [name]. MRN: [id number] DOB: [birth year] Today's Date: 09/12/2018    History of Present Illness Paul [name] is a 53yo Male who [facility] c worsening Right heel wound pain and redness, found to have  osteomyelitis of the calcaneus and achilles tendon: debridement of same area back in March. PMH: cervical myelopathy, OSA, cervical radiculopahty with bial thand flexion neurogenic weaknes, CHF, CKD, CAD s/p CABGx3, DM, R transmet amputation, Rt hip surgery. Pt underwent surgical debridement/resection of insertional Rt achilles tendon and resection of Rt calcaneus on 5/7. On 5/8 pt underwent TPA of the Rt poplietal artery, and Rt IJ cath placement for IV ABX at DC.     PT Comments    Pt agreeable to PT; denies pain. Education and participation in general lower extremity basic exercises. Pt demonstrating STS and stand pivot transfers with cues only for RLE non weight bearing status with good compliance following cues. Able to perform with supervision/Mod I. Pt declines further ambulation at this time; pt feels he will manage well within his home. Encouraged continued work on LE exercises for strength. Continue PT to progress all functional mobility to ensure a safe transition home.    Follow Up Recommendations  Home health PT;Supervision for mobility/OOB     Equipment Recommendations  None recommended by PT    Recommendations for Other Services       Precautions / Restrictions Precautions Precautions: Fall Restrictions Weight Bearing Restrictions: Yes RLE Weight Bearing: Non weight bearing LLE Weight Bearing: Weight bearing as tolerated    Mobility  Bed Mobility Overal bed mobility: Independent                Transfers Overall transfer level: Needs assistance Equipment used: Rolling walker (2 wheeled) Transfers: Sit to/from [employer] Transfers Sit to Stand: Supervision;Modified independent  (Device/Increase time) Stand pivot transfers: Supervision;Modified independent (Device/Increase time)       General transfer comment: No LOB; maintains NWB with cues   Ambulation/Gait             General Gait Details: declines further than bed to/from bed   Stairs             Wheelchair Mobility    Modified Rankin (Stroke Patients Only)       Balance                                            Cognition Arousal/Alertness: Awake/alert Behavior During Therapy: WFL for tasks assessed/performed Overall Cognitive Status: Within Functional Limits for tasks assessed                                        Exercises General Exercises - Lower Extremity Ankle Circles/Pumps: AROM;Both;10 reps Long Arc Quad: AROM;Both;10 reps Hip ABduction/ADduction: AROM;Both;10 reps Hip Flexion/Marching: AROM;Both;10 reps(performed single side at a time)    General Comments        Pertinent Vitals/Pain Pain Assessment: No/denies pain    Home Living                      Prior Function            PT Goals (current goals can now be found in the care plan section) Progress  towards PT goals: Progressing toward goals    Frequency    7X/week      PT Plan Current plan remains appropriate    Co-evaluation              AM-PAC PT "6 Clicks" Mobility   Outcome Measure  Help needed turning from your back to your side while in a flat bed without using bedrails?: None Help needed moving from lying on your back to sitting on the side of a flat bed without using bedrails?: None Help needed moving to and from a bed to a chair (including a wheelchair)?: A Little Help needed standing up from a chair using your arms (e.g., wheelchair or bedside chair)?: A Little Help needed to walk in hospital room?: A Little Help needed climbing 3-5 steps with a railing? : A Lot 6 Click Score: 19    End of Session Equipment Utilized During  Treatment: Gait belt Activity Tolerance: Patient tolerated treatment well Patient left: in chair;with call bell/phone within reach   PT Visit Diagnosis: Unsteadiness on feet (R26.81);Other abnormalities of gait and mobility (R26.89);Difficulty in walking, not elsewhere classified (R26.2);Muscle weakness (generalized) (M62.81)     Time: 1240-1300 PT Time Calculation (min) (ACUTE ONLY): 20 min  Charges:  $Therapeutic Exercise: 8-22 mins                        Omero Kowal E Alper Guilmette 09/12/2018, 1:50 PM

## 2018-09-12 NOTE — Progress Notes (Signed)
PT Cancellation Note  Patient Details Name: Paul [name]. MRN: [id number] DOB: 06-11-1965   Cancelled Treatment:    Reason Eval/Treat Not Completed: Other (comment). Treatment attempted twice; pt with other staff. Currently, pt has a discharge order; will re attempt as time allows as well as discharge plans.    Larae Grooms, PTA 09/12/2018, 12:27 PM

## 2018-09-12 NOTE — Treatment Plan (Signed)
Diagnosis: MRSA aureus osteomyelitis calcaneum  Baseline Creatinine : 2.73  Culture Result: MRSA, prevotella  Allergies  Allergen Reactions  . Other Other (See Comments)    Cardiac Problems. Pt states he tolerates Toradol. Due to kidney and heart problems per pt  . Ibuprofen Other (See Comments)    Heart problems  . Baclofen Other (See Comments)  . Metformin Diarrhea  . Nsaids     Due to kidney and heart problems per pt    OPAT Orders Discharge antibiotics: Daptomycin 77m/KG IV every 24 hours- 7567mevery 24 hours for 4 weeks until 10/10/18 Ceftriaxone 2 grams Iv every 24 hrs for 4 weeks until 10/10/18 Flagyl PO 50037mID for 4 weeksuntil 10/10/18 [place] Protocol:  Labs weekly on monday while on IV antibiotics:  _X_ CBC with differential X CMP X CPK  Labs every other Monday while on antibiotic  _X__ CRP _X_ ESR  Care of power line including placement of biopatch PT has power line so need appt with vascular to remove the line  X__ Please leave line place until doctor has seen patient or been notified  Fax weekly labs to ([phone] Dr.Shawndra Clute with critical labs [phone] Follow Up Appt: Call [phone] make appt

## 2018-09-12 NOTE — Progress Notes (Signed)
Pt. Discharged to home via private vehicle. Discharge instructions and medication regimen reviewed at bedside with patient. Pt. verbalizes understanding of instructions and medication regimen. Prescriptions with pt. Patient assessment unchanged from this morning.  Discharged with PICC line for home IV medication administration.

## 2018-09-12 NOTE — Progress Notes (Signed)
Subjective: Patient dates he is doing well he has no pain or discomfort at this point.  States he did his physical therapy and he is comfortable with moving himself from the bed to the bedside toilet or from the bed to the bedside chair without putting weight on the foot.  Particular the right foot.  He can put full weight on the left foot.  Physical therapy feels like he is able to do that without too much trouble just needs help when he is climbing steps 3-5 steps with a railing.  He states he cannot go to rehab because of his insurance regulations.  Objective: Both wounds were addressed today and evaluated the dressings were changed.  The right heel wound is still stable still has drainage to the region.  No evidence of infection to the region at this point no purulent drainage the drainage is more serous and bleeding type drainage that is purulence.  On the left heel the wound is stable good granular base and healing well.  Assessment: Patient is doing well with both areas is gone to be quite a period of time before he is going to heal up from the right area but will need to continue to show progress.   Plan: Saw the patient today with Dr. Tama High she is going to try to get him on daptomycin to cover MRSA which is what is growing out of the wound.  I think this is what was affecting and infecting his Achilles tendon.  He will need to continue to stay nonweightbearing on the right foot when he goes home.  He will need home health to change his dressings on both wounds at least every other day.  On the left heel I would suggest calcium alginate along with a dry gauze dressing with lots of padding to the region.  On the right heel I would recommend a dry dressing with heavy gauze padding both areas should use Kerlix as an external wrap along with multiple gauze pads directly on the wounds.  He should have on his heel protector boots at all times when he is in bed and make sure nothing is putting  pressure on either heel.  He needs to return to see me a week from next Wednesday.  If any questions arise regarding this please call.

## 2018-09-12 NOTE — Discharge Instructions (Signed)
It was so nice to meet you during this hospitalization!  You came into the hospital with an infection of your foot. We treated you with IV antibiotics.  -Please start taking Plavix 75mg  daily to help you continue to get good blood flow to your feet -The infectious disease doctor has recommended the following antibiotics: 1.You will need to take ceftriaxone and daptomycin through your IV.  2. You will also need to take Flagyl 500mg  twice a day by mouth through 6/8- you should pick up this prescription from your pharmacy.  Take care, Dr. Brett Albino  Instructions from Dr. Elvina Mattes:  Applied new dressings to both heels every other day.  On the left heel apply calcium alginate with gauze padding and Kerlix wrap.  On the right heel apply dry gauze dressing with lots of 4 x 4's and a Kerlix pad and wrap.  Make sure he uses the heel protector boots at all times and he is in bed and no pressure on the heels himself.  He must remain completely nonweightbearing on the right foot.  Recommend he returns to my office and a week and a half.  A week from Wednesday.

## 2018-09-12 NOTE — Progress Notes (Signed)
Inpatient Diabetes Program Recommendations  AACE/ADA: New Consensus Statement on Inpatient Glycemic Control  Target Ranges:  Prepandial:   less than 140 mg/dL      Peak postprandial:   less than 180 mg/dL (1-2 hours)      Critically ill patients:  140 - 180 mg/dL   Results for Paul [name], Paul [name] (MRN [id number]) as of 09/12/2018 10:36  Ref. Range 09/11/2018 07:52 09/11/2018 11:29 09/11/2018 16:15 09/11/2018 20:53 09/12/2018 07:54  Glucose-Capillary Latest Ref Range: 70 - 99 mg/dL 108 (H) 108 (H) 194 (H) 293 (H) 131 (H)   Review of Glycemic Control  Diabetes history: DM2 Outpatient Diabetes medications: Lantus 20 units QHS Current orders for Inpatient glycemic control: Lantus 20 units QAM, Novolog 0-9 units TID with meals, Novolog 0-5 units QHS; Prednisone 5 mg QAM  Inpatient Diabetes Program Recommendations:   Insulin - Meal Coverage: If Prednisone is continued, please consider ordering Novolog 2 units TID with meals for meal coverage if patient eats at least 50% of meals.  Thanks, Barnie Alderman, RN, MSN, CDE Diabetes Coordinator Inpatient Diabetes Program [phone] (Team Pager from 8am to 5pm)

## 2018-09-12 NOTE — TOC Transition Note (Signed)
Transition of Care [facility]) - CM/SW Discharge Note   Patient Details  Name: Paul [name]. MRN: [id number] Date of Birth: [birth year]  Transition of Care [facility]) CM/SW Contact:  Annamaria Boots, Latanya Presser Phone Number: 09/12/2018, 2:42 PM   Clinical Narrative:  Patient is medically ready for discharge today. Patient will return home with IV antibiotics and home health. CSW notified Corene Cornea with [facility] that patient will discharge today and needs home health for wound care and therapy. CSW also notified Pam with [facility] of need for IV antibiotics.      Final next level of care: [place] Barriers to Discharge: No Barriers Identified   Patient Goals and CMS Choice Patient states their goals for this hospitalization and ongoing recovery are:: Return home  CMS Medicare.gov Compare Post Acute Care list provided to:: Patient Choice offered to / list presented to : Patient  Discharge Placement                       Discharge Plan and Services     Post Acute Care Choice: Home Health                    HH Arranged: RN, PT, Nurse's Aide [facility] Agency: [place] ([facility]) Date [facility] Agency Contacted: 09/12/18 Time [place]: 1442 Representative spoke with at [place]: Corene Cornea and Pam   Social Determinants of Health ([place]) Interventions     Readmission Risk Interventions Readmission Risk Prevention Plan 09/09/2018 08/02/2018  Transportation Screening Complete Complete  PCP or Specialist Appt within 3-5 Days Complete -  HRI or [place] Complete Complete  Social Work Consult for [place] Planning/Counseling Not Complete Complete  SW consult not completed comments NA -  Palliative Care Screening Not Applicable Not Applicable  Medication Review Press photographer) - Complete  Some recent data might be hidden

## 2018-09-12 NOTE — Progress Notes (Signed)
[facility]  ROUNDING NOTE   Subjective:   Patient is doing fair No c/o nausea or vomiting Anticipating discharge  Objective:  Vital signs in last 24 hours:  Temp:  [97.4 F (36.3 C)-98.3 F (36.8 C)] 98.3 F (36.8 C) (05/11 0757) Pulse Rate:  [76-88] 82 (05/11 1240) Resp:  [16-20] 16 (05/11 0757) BP: (125-153)/(68-86) 128/79 (05/11 0757) SpO2:  [98 %-100 %] 98 % (05/11 1240)  Weight change:  Filed Weights   09/05/18 1752 09/06/18 0301 09/09/18 1520  Weight: 98.4 kg 97.5 kg 95.7 kg    Intake/Output: I/O last 3 completed shifts: In: 100 [IV Piggyback:100] Out: 1800 [Urine:1800]   Intake/Output this shift:  Total I/O In: 104.6 [IV Piggyback:104.6] Out: -   Physical Exam: General: NAD,   Head: Normocephalic, atraumatic. Moist oral mucosal membranes  Eyes: Anicteric,    Neck: Supple,   Lungs:  Clear to auscultation  Heart: Regular rate and rhythm  Abdomen:  Soft, nontender,   Extremities:  bilateral feet with pressure boots, trace edema  Neurologic: Nonfocal, moving all four extremities  Skin: No lesions        Basic Metabolic Panel: Recent Labs  Lab 09/07/18 0435 09/08/18 0325 09/09/18 0336 09/10/18 0449 09/11/18 0455  NA 140 136 135 136 138  K 4.1 3.9 4.1 4.0 3.8  CL 106 101 99 101 102  CO2 25 24 23 26 25   GLUCOSE 109* 130* 265* 273* 142*  BUN 53* 61* 66* 60* 61*  CREATININE 2.75* 2.78* 2.63* 2.35* 2.73*  CALCIUM 8.6* 8.3* 8.4* 8.4* 8.8*  MG  --   --  1.7  --   --   PHOS  --   --   --  4.0 4.2    Liver Function Tests: Recent Labs  Lab 09/05/18 1754 09/10/18 0449 09/11/18 0455  AST 15  --   --   ALT 9  --   --   ALKPHOS 99  --   --   BILITOT 0.5  --   --   PROT 7.3  --   --   ALBUMIN 3.5 2.9* 2.9*   No results for input(s): LIPASE, AMYLASE in the last 168 hours. No results for input(s): AMMONIA in the last 168 hours.  CBC: Recent Labs  Lab 09/05/18 1754  09/07/18 0435 09/08/18 0325 09/09/18 0336 09/10/18 0448  09/11/18 0455  WBC 12.8*   < > 12.1* 12.5* 13.0* 13.7* 12.4*  NEUTROABS 11.1*  --   --   --   --   --   --   HGB 7.7*   < > 7.4* 6.9* 8.7* 8.1* 8.2*  HCT 26.0*   < > 25.3* 23.4* 28.0* 26.0* 26.5*  MCV 86.4   < > 86.6 85.7 84.3 84.7 84.9  PLT 327   < > 329 302 347 350 351   < > = values in this interval not displayed.    Cardiac Enzymes: Recent Labs  Lab 09/12/18 1310  CKTOTAL 48*    BNP: Invalid input(s): POCBNP  CBG: Recent Labs  Lab 09/11/18 1129 09/11/18 1615 09/11/18 2053 09/12/18 0754 09/12/18 1143  GLUCAP 108* 194* 293* 131* 128*    Microbiology: Results for orders placed or performed during the hospital encounter of 09/05/18  SARS Coronavirus 2 (CEPHEID - Performed in [place] hospital lab), [facility] Order     Status: None   Collection Time: 09/05/18 11:49 PM  Result Value Ref Range Status   SARS Coronavirus 2 NEGATIVE NEGATIVE Final  Comment: (NOTE) If result is NEGATIVE SARS-CoV-2 target nucleic acids are NOT DETECTED. The SARS-CoV-2 RNA is generally detectable in upper and lower  respiratory specimens during the acute phase of infection. The lowest  concentration of SARS-CoV-2 viral copies this assay can detect is 250  copies / mL. A negative result does not preclude SARS-CoV-2 infection  and should not be used as the sole basis for treatment or other  patient management decisions.  A negative result may occur with  improper specimen collection / handling, submission of specimen other  than nasopharyngeal swab, presence of viral mutation(s) within the  areas targeted by this assay, and inadequate number of viral copies  (<250 copies / mL). A negative result must be combined with clinical  observations, patient history, and epidemiological information. If result is POSITIVE SARS-CoV-2 target nucleic acids are DETECTED. The SARS-CoV-2 RNA is generally detectable in upper and lower  respiratory specimens dur ing the acute phase of infection.  Positive   results are indicative of active infection with SARS-CoV-2.  Clinical  correlation with patient history and other diagnostic information is  necessary to determine patient infection status.  Positive results do  not rule out bacterial infection or co-infection with other viruses. If result is PRESUMPTIVE POSTIVE SARS-CoV-2 nucleic acids MAY BE PRESENT.   A presumptive positive result was obtained on the submitted specimen  and confirmed on repeat testing.  While 2019 novel coronavirus  (SARS-CoV-2) nucleic acids may be present in the submitted sample  additional confirmatory testing may be necessary for epidemiological  and / or clinical management purposes  to differentiate between  SARS-CoV-2 and other Sarbecovirus currently known to infect humans.  If clinically indicated additional testing with an alternate test  methodology [phone]) is advised. The SARS-CoV-2 RNA is generally  detectable in upper and lower respiratory sp ecimens during the acute  phase of infection. The expected result is Negative. Fact Sheet for Patients:  StrictlyIdeas.no Fact Sheet for Healthcare Providers: BankingDealers.co.za This test is not yet approved or cleared by the [place] FDA and has been authorized for detection and/or diagnosis of SARS-CoV-2 by FDA under an Emergency Use Authorization (EUA).  This EUA will remain in effect (meaning this test can be used) for the duration of the COVID-19 declaration under Section 564(b)(1) of the Act, 21 U.S.C. section 360bbb-3(b)(1), unless the authorization is terminated or revoked sooner. Performed at [facility], [address] [place], [place] [address]   MRSA PCR Screening     Status: Abnormal   Collection Time: 09/06/18  4:35 AM  Result Value Ref Range Status   MRSA by PCR POSITIVE (A) NEGATIVE Final    Comment:        The GeneXpert MRSA Assay (FDA approved for NASAL specimens only), is  one component of a comprehensive MRSA colonization surveillance program. It is not intended to diagnose MRSA infection nor to guide or monitor treatment for MRSA infections. RESULT CALLED TO, READ BACK BY AND VERIFIED WITH: JENNIFER COLE @0812  ON 09/06/2018 BY FMW Performed at [facility], [address] [place], [place] [address]   Aerobic/Anaerobic Culture (surgical/deep wound)     Status: None (Preliminary result)   Collection Time: 09/08/18  2:25 PM  Result Value Ref Range Status   Specimen Description   Final    TISSUE ACHILLES TENDON SOFT TISSUE Performed at [facility], [address] [place], [place] [address]    Special Requests   Final    NONE Performed at  [address], [place] [address]    Gram Stain   Final    MODERATE WBC PRESENT,BOTH PMN AND MONONUCLEAR FEW GRAM POSITIVE COCCI Performed at [facility], [place] [address] [place], [place] [address]    Culture   Final    ABUNDANT METHICILLIN RESISTANT STAPHYLOCOCCUS AUREUS FEW PREVOTELLA BIVIA BETA LACTAMASE POSITIVE FEW ENTEROCOCCUS FAECALIS    Report Status PENDING  Incomplete   Organism ID, Bacteria METHICILLIN RESISTANT STAPHYLOCOCCUS AUREUS  Final      Susceptibility   Methicillin resistant staphylococcus aureus - MIC*    CIPROFLOXACIN >=8 RESISTANT Resistant     ERYTHROMYCIN >=8 RESISTANT Resistant     GENTAMICIN <=0.5 SENSITIVE Sensitive     OXACILLIN >=4 RESISTANT Resistant     TETRACYCLINE <=1 SENSITIVE Sensitive     VANCOMYCIN 1 SENSITIVE Sensitive     TRIMETH/SULFA >=320 RESISTANT Resistant     CLINDAMYCIN <=0.25 SENSITIVE Sensitive     RIFAMPIN <=0.5 SENSITIVE Sensitive     Inducible Clindamycin NEGATIVE Sensitive     * ABUNDANT METHICILLIN RESISTANT STAPHYLOCOCCUS AUREUS    Coagulation Studies: No results for input(s): LABPROT, INR in the last 72 hours.  Urinalysis: No results for input(s): COLORURINE, LABSPEC, PHURINE,  GLUCOSEU, HGBUR, BILIRUBINUR, KETONESUR, PROTEINUR, UROBILINOGEN, NITRITE, LEUKOCYTESUR in the last 72 hours.  Invalid input(s): APPERANCEUR    Imaging: No results found.   Medications:   . sodium chloride 0 mL/hr at 09/06/18 1459  . sodium chloride    . cefTRIAXone (ROCEPHIN)  IV 2 g (09/12/18 1431)  . DAPTOmycin (CUBICIN)  IV     . amiodarone  200 mg Oral BID  . aspirin EC  81 mg Oral Daily  . carvedilol  3.125 mg Oral BID  . clopidogrel  75 mg Oral Q breakfast  . gabapentin  300 mg Oral TID  . heparin  5,000 Units Subcutaneous Q8H  . insulin aspart  0-5 Units Subcutaneous QHS  . insulin aspart  0-9 Units Subcutaneous TID WC  . insulin glargine  20 Units Subcutaneous q morning - 10a  . metroNIDAZOLE  500 mg Oral Q12H  . multivitamin-lutein  1 capsule Oral Daily  . predniSONE  5 mg Oral Q breakfast  . sodium chloride flush  3 mL Intravenous Q12H  . torsemide  40 mg Oral q morning - 10a   And  . torsemide  20 mg Oral QPM   sodium chloride, sodium chloride, acetaminophen **OR** acetaminophen, morphine injection, ondansetron **OR** ondansetron (ZOFRAN) IV, ondansetron (ZOFRAN) IV, ondansetron (ZOFRAN) IV, oxyCODONE, sodium chloride flush  Assessment/ Plan:  [name] is a 53 y.o. black male  diabetes mellitus type II, hypertension,coronary artery disease, congestive heart failure, ICD, carotid stenosis, CVA, obstructive sleep apnea, peripheral vascular disease, gout,who was admitted to [facility] on5/08/2018 for Wound infection [T14.8XXA, L08.9] Other acute osteomyelitis of right foot ([place]) [M86.171] Type 2 diabetes mellitus with other specified complication, unspecified whether long term insulin use ([place]) [E11.69]  1. Chronic kidney disease stage IV with proteinuria:  Chronic kidney disease secondary to diabetic nephropathy - not currently on an ACE-I/ARB -will consider outpatient once serum creatinine is considered to be stable - Outpatient follow up with  Nephrology.   2. Right heel osteomyelitis: status post debridement on 5/7 by Dr. Elvina Mattes and Angiogram on 5/8 by Dr. Delana Meyer -  Iv antibiotics as per ID team - outpatient follow up  3. Hypertension with CKD: blood pressure control acceptable  - carvedilol and  torsemide.  4. Anemia of chronic kidney disease:Hemoglobin 8.2. Normocytic.  Discussed ESA therapy as outpatient.  Continue to monitor.  5. Diabetes mellitus type II with chronic kidney disease: insulin dependent   Complication of diabetic foot ulcer with osteomyelitis. Lab Results  Component Value Date   HGBA1C 7.9 (H) 05/25/2018      LOS: 6 Paul [name] 5/11/20203:16 PM

## 2018-09-12 NOTE — Progress Notes (Addendum)
Date of Admission:  09/05/2018      Angio on 09/09/18-  Findings are right common femoral and profunda femoris patent SFA patent except for 30 to 40% narrowing at the origin. Above-knee and mid popliteal patent Distal popliteal demonstrated greater than 70% stenosis just below the origin of the anterior tibial.  It was angioplastied to 4 mm Anterior tibial patent all the way to the foot and fills the pedal arch Tibioperoneal trunk is patent and previously angioplasty appears to have treated the lesion well. Moderate distal stenosis of the tibioperoneal trunk Posterior tibial occluded at its origin and remains occluded throughout its entirety  Subjective: Pt feeling better No specific complaints  Medications:  . amiodarone  200 mg Oral BID  . aspirin EC  81 mg Oral Daily  . carvedilol  3.125 mg Oral BID  . clopidogrel  75 mg Oral Q breakfast  . gabapentin  300 mg Oral TID  . heparin  5,000 Units Subcutaneous Q8H  . insulin aspart  0-5 Units Subcutaneous QHS  . insulin aspart  0-9 Units Subcutaneous TID WC  . insulin glargine  20 Units Subcutaneous q morning - 10a  . multivitamin-lutein  1 capsule Oral Daily  . predniSONE  5 mg Oral Q breakfast  . sodium chloride flush  3 mL Intravenous Q12H  . torsemide  40 mg Oral q morning - 10a   And  . torsemide  20 mg Oral QPM    Objective: Vital signs in last 24 hours: Temp:  [97.4 F (36.3 C)-98.3 F (36.8 C)] 98.3 F (36.8 C) (05/11 0757) Pulse Rate:  [76-88] 77 (05/11 0757) Resp:  [16-20] 16 (05/11 0757) BP: (125-153)/(68-86) 128/79 (05/11 0757) SpO2:  [99 %-100 %] 100 % (05/11 0757)  PHYSICAL EXAM:  General: Alert, cooperative, no distress, appears stated age.  Head: Normocephalic, without obvious abnormality, atraumatic. Eyes: Conjunctivae clear, anicteric sclerae. Pupils are equal ENT Nares normal. No drainage or sinus tenderness. Lips, mucosa, and tongue normal. No Thrush Neck: rt power line ( subclavian) Lungs: b/l  air entry Heart: s1s2 ICD site okay Abdomen: Soft, Extremities: rt heel- surgical site- looks okay- has some maceration   Left heel ulcer-very superficial has some clotted blood , on wiping clean , red base    Skin: No rashes or lesions. Or bruising Lymph: Cervical, supraclavicular normal. Neurologic: flex\xion contractions of her fingers  Lab Results Recent Labs    09/10/18 0448 09/10/18 0449 09/11/18 0455  WBC 13.7*  --  12.4*  HGB 8.1*  --  8.2*  HCT 26.0*  --  26.5*  NA  --  136 138  K  --  4.0 3.8  CL  --  101 102  CO2  --  26 25  BUN  --  60* 61*  CREATININE  --  2.35* 2.73*   Liver Panel Recent Labs    09/10/18 0449 09/11/18 0455  ALBUMIN 2.9* 2.9*   Sedimentation Rate No results for input(s): ESRSEDRATE in the last 72 hours. C-Reactive Protein No results for input(s): CRP in the last 72 hours.  Microbiology: MRSA in surgical culture    Assessment/Plan:  53 year old male with a history of diabetes mellitus, hypertension, CAD, diabetic foot infection with amputation of all the toes in the right foot, bilateral heel ulcers status post previous surgery was admitted on 09/05/2018 with worsening of the right heel ulcer.  Diabetic foot infection with bilateral heel ulcers: osteomyelitis of clacaneum - rt Underwent excision of infected necrotic Achilles tendon  on the right side, and excision of the posterior calcaneus on the right side as well.  Culture was sent and it is  MRSA and pathology still pending He is currently on vancomycin and ceftriaxone. Because of CKD- would change vanco to dapto  CKD: Has a permacath for IV antibiotics  Anemia of chronic disease  PAD- had angio on 5/8-see note above  Diabetes mellitus is on insulin  CAD status post CABG also has AICD  PT seen with Dr.Troxler.  Will put antibiotic orders

## 2018-09-12 NOTE — Progress Notes (Signed)
PHARMACY CONSULT NOTE FOR:  OUTPATIENT  PARENTERAL ANTIBIOTIC THERAPY (OPAT)  Indication: diabetic foot with osteomyelitis Regimen:  Daptomycin 8mg /KG IV every 24 hours- 750mg  every 24 hours for 4 weeks until 10/10/18 Ceftriaxone 2 grams Iv every 24 hrs for 4 weeks until 10/10/18 End date: 10/10/18  IV antibiotic discharge orders are pended. To discharging provider:  please sign these orders via discharge navigator,  Select New Orders & click on the button choice - Manage This Unsigned Work.     Thank you for allowing pharmacy to be a part of this patient's care.  Tawnya Crook, PharmD Pharmacy Resident  09/12/2018 2:32 PM

## 2018-09-13 DIAGNOSIS — E1151 Type 2 diabetes mellitus with diabetic peripheral angiopathy without gangrene: Secondary | ICD-10-CM | POA: Diagnosis not present

## 2018-09-13 DIAGNOSIS — L89623 Pressure ulcer of left heel, stage 3: Secondary | ICD-10-CM | POA: Diagnosis not present

## 2018-09-13 DIAGNOSIS — Z89421 Acquired absence of other right toe(s): Secondary | ICD-10-CM | POA: Diagnosis not present

## 2018-09-13 DIAGNOSIS — L89613 Pressure ulcer of right heel, stage 3: Secondary | ICD-10-CM | POA: Diagnosis not present

## 2018-09-13 DIAGNOSIS — B964 Proteus (mirabilis) (morganii) as the cause of diseases classified elsewhere: Secondary | ICD-10-CM | POA: Diagnosis not present

## 2018-09-13 DIAGNOSIS — Z4800 Encounter for change or removal of nonsurgical wound dressing: Secondary | ICD-10-CM | POA: Diagnosis not present

## 2018-09-14 DIAGNOSIS — Z4800 Encounter for change or removal of nonsurgical wound dressing: Secondary | ICD-10-CM | POA: Diagnosis not present

## 2018-09-14 DIAGNOSIS — L89623 Pressure ulcer of left heel, stage 3: Secondary | ICD-10-CM | POA: Diagnosis not present

## 2018-09-14 DIAGNOSIS — L89613 Pressure ulcer of right heel, stage 3: Secondary | ICD-10-CM | POA: Diagnosis not present

## 2018-09-14 DIAGNOSIS — Z89421 Acquired absence of other right toe(s): Secondary | ICD-10-CM | POA: Diagnosis not present

## 2018-09-14 DIAGNOSIS — B964 Proteus (mirabilis) (morganii) as the cause of diseases classified elsewhere: Secondary | ICD-10-CM | POA: Diagnosis not present

## 2018-09-14 DIAGNOSIS — E1151 Type 2 diabetes mellitus with diabetic peripheral angiopathy without gangrene: Secondary | ICD-10-CM | POA: Diagnosis not present

## 2018-09-16 ENCOUNTER — Other Ambulatory Visit (INDEPENDENT_AMBULATORY_CARE_PROVIDER_SITE_OTHER): Payer: Self-pay | Admitting: Vascular Surgery

## 2018-09-16 DIAGNOSIS — B964 Proteus (mirabilis) (morganii) as the cause of diseases classified elsewhere: Secondary | ICD-10-CM | POA: Diagnosis not present

## 2018-09-16 DIAGNOSIS — L89613 Pressure ulcer of right heel, stage 3: Secondary | ICD-10-CM | POA: Diagnosis not present

## 2018-09-16 DIAGNOSIS — Z4800 Encounter for change or removal of nonsurgical wound dressing: Secondary | ICD-10-CM | POA: Diagnosis not present

## 2018-09-16 DIAGNOSIS — L89623 Pressure ulcer of left heel, stage 3: Secondary | ICD-10-CM | POA: Diagnosis not present

## 2018-09-16 DIAGNOSIS — E1151 Type 2 diabetes mellitus with diabetic peripheral angiopathy without gangrene: Secondary | ICD-10-CM | POA: Diagnosis not present

## 2018-09-16 DIAGNOSIS — Z89421 Acquired absence of other right toe(s): Secondary | ICD-10-CM | POA: Diagnosis not present

## 2018-09-16 DIAGNOSIS — Z9862 Peripheral vascular angioplasty status: Secondary | ICD-10-CM

## 2018-09-16 LAB — AEROBIC/ANAEROBIC CULTURE W GRAM STAIN (SURGICAL/DEEP WOUND)

## 2018-09-19 DIAGNOSIS — L89623 Pressure ulcer of left heel, stage 3: Secondary | ICD-10-CM | POA: Diagnosis not present

## 2018-09-19 DIAGNOSIS — B964 Proteus (mirabilis) (morganii) as the cause of diseases classified elsewhere: Secondary | ICD-10-CM | POA: Diagnosis not present

## 2018-09-19 DIAGNOSIS — E1151 Type 2 diabetes mellitus with diabetic peripheral angiopathy without gangrene: Secondary | ICD-10-CM | POA: Diagnosis not present

## 2018-09-19 DIAGNOSIS — L89613 Pressure ulcer of right heel, stage 3: Secondary | ICD-10-CM | POA: Diagnosis not present

## 2018-09-19 DIAGNOSIS — Z4800 Encounter for change or removal of nonsurgical wound dressing: Secondary | ICD-10-CM | POA: Diagnosis not present

## 2018-09-19 DIAGNOSIS — Z89421 Acquired absence of other right toe(s): Secondary | ICD-10-CM | POA: Diagnosis not present

## 2018-09-20 ENCOUNTER — Ambulatory Visit (INDEPENDENT_AMBULATORY_CARE_PROVIDER_SITE_OTHER): Payer: Medicare Other | Admitting: Vascular Surgery

## 2018-09-20 ENCOUNTER — Encounter (INDEPENDENT_AMBULATORY_CARE_PROVIDER_SITE_OTHER): Payer: Medicare Other

## 2018-09-21 ENCOUNTER — Telehealth: Payer: Self-pay | Admitting: Infectious Diseases

## 2018-09-21 DIAGNOSIS — L89613 Pressure ulcer of right heel, stage 3: Secondary | ICD-10-CM | POA: Diagnosis not present

## 2018-09-21 DIAGNOSIS — Z4800 Encounter for change or removal of nonsurgical wound dressing: Secondary | ICD-10-CM | POA: Diagnosis not present

## 2018-09-21 DIAGNOSIS — B964 Proteus (mirabilis) (morganii) as the cause of diseases classified elsewhere: Secondary | ICD-10-CM | POA: Diagnosis not present

## 2018-09-21 DIAGNOSIS — Z89421 Acquired absence of other right toe(s): Secondary | ICD-10-CM | POA: Diagnosis not present

## 2018-09-21 DIAGNOSIS — E1151 Type 2 diabetes mellitus with diabetic peripheral angiopathy without gangrene: Secondary | ICD-10-CM | POA: Diagnosis not present

## 2018-09-21 DIAGNOSIS — L89623 Pressure ulcer of left heel, stage 3: Secondary | ICD-10-CM | POA: Diagnosis not present

## 2018-09-21 NOTE — Telephone Encounter (Signed)
Got a call from [facility] that the cr was 4.2 9 was 2.3 on discharge 2 weeks ago) K was 4 Pt was eating and drinking- asked them to repeat the labs tomorrow and also tried to contact patient but he was not available- left a message for him- The pharmacist was able to talk to him, I also spoke to Brock Hall ( nephrologist) who concurred with the plan

## 2018-09-23 ENCOUNTER — Other Ambulatory Visit: Payer: Self-pay | Admitting: Pharmacist

## 2018-09-23 ENCOUNTER — Telehealth: Payer: Self-pay | Admitting: Infectious Diseases

## 2018-09-23 DIAGNOSIS — L89623 Pressure ulcer of left heel, stage 3: Secondary | ICD-10-CM | POA: Diagnosis not present

## 2018-09-23 DIAGNOSIS — B964 Proteus (mirabilis) (morganii) as the cause of diseases classified elsewhere: Secondary | ICD-10-CM | POA: Diagnosis not present

## 2018-09-23 DIAGNOSIS — Z4800 Encounter for change or removal of nonsurgical wound dressing: Secondary | ICD-10-CM | POA: Diagnosis not present

## 2018-09-23 DIAGNOSIS — L89613 Pressure ulcer of right heel, stage 3: Secondary | ICD-10-CM | POA: Diagnosis not present

## 2018-09-23 DIAGNOSIS — Z89421 Acquired absence of other right toe(s): Secondary | ICD-10-CM | POA: Diagnosis not present

## 2018-09-23 DIAGNOSIS — E1151 Type 2 diabetes mellitus with diabetic peripheral angiopathy without gangrene: Secondary | ICD-10-CM | POA: Diagnosis not present

## 2018-09-23 NOTE — Patient Outreach (Signed)
[facility]) Care Management [place]  09/23/2018  Paul [name]. 1965/12/19 [id number]  Reason for referral: medication assistance with Eliquis  Successful call with patient today.  Patient assistance program application for Eliquis has not been returned yet to [facility].  Patient reports Eliquis stopped recently.  Per EMR notes, patient had recent hospitalization 5/4-5/11/20 for acute OM and diabetic foot wound.  Patient discharged home on ASA + Plavix for PAD and needs anticoagulation re-addressed with PCP.  Patient reports he will f/u with [facility] if Eliquis resumed.   Plan: Will route note to Dr. Ginette Pitman regarding patient's Eliquis.  Will close [facility] pharmacy case at this time.  Am happy to assist in the future as needed if patient wishes to continue with patient assistance program application process.   Ralene Bathe, PharmD, [place] [phone]

## 2018-09-23 NOTE — Telephone Encounter (Signed)
Spoke to patient today Foot ulcer- infected- s/p surgery on Iv antibiotic for 6 weeks. Culture had MRSA, enterococcus fecalis and anerobes- he went home on daptomycin, ceftriaxone and flagyl- on discharge cr was 2.3 Later on 5/18 it was 4.3 and repeated on 5/19 it was 3.9  Currently doing daptomycin every 48 hrs and Po flagyl 500mg  BID- ceftriaxone has been stopped since 09/20/18 because of increase in creatinine. Marland KitchenHe is doing well  Next cr is on Tuesday. Will decide on restarting either ceftriaxone or changing to a quinolone if needed- very likely will not need either as no gram neg organisms this time

## 2018-09-26 DIAGNOSIS — Z89421 Acquired absence of other right toe(s): Secondary | ICD-10-CM | POA: Diagnosis not present

## 2018-09-26 DIAGNOSIS — L89623 Pressure ulcer of left heel, stage 3: Secondary | ICD-10-CM | POA: Diagnosis not present

## 2018-09-26 DIAGNOSIS — L89613 Pressure ulcer of right heel, stage 3: Secondary | ICD-10-CM | POA: Diagnosis not present

## 2018-09-26 DIAGNOSIS — Z4800 Encounter for change or removal of nonsurgical wound dressing: Secondary | ICD-10-CM | POA: Diagnosis not present

## 2018-09-26 DIAGNOSIS — E1151 Type 2 diabetes mellitus with diabetic peripheral angiopathy without gangrene: Secondary | ICD-10-CM | POA: Diagnosis not present

## 2018-09-26 DIAGNOSIS — B964 Proteus (mirabilis) (morganii) as the cause of diseases classified elsewhere: Secondary | ICD-10-CM | POA: Diagnosis not present

## 2018-09-27 ENCOUNTER — Inpatient Hospital Stay: Payer: Medicare Other | Admitting: Infectious Diseases

## 2018-09-27 DIAGNOSIS — L89613 Pressure ulcer of right heel, stage 3: Secondary | ICD-10-CM | POA: Diagnosis not present

## 2018-09-27 DIAGNOSIS — Z4800 Encounter for change or removal of nonsurgical wound dressing: Secondary | ICD-10-CM | POA: Diagnosis not present

## 2018-09-27 DIAGNOSIS — E1151 Type 2 diabetes mellitus with diabetic peripheral angiopathy without gangrene: Secondary | ICD-10-CM | POA: Diagnosis not present

## 2018-09-27 DIAGNOSIS — Z89421 Acquired absence of other right toe(s): Secondary | ICD-10-CM | POA: Diagnosis not present

## 2018-09-27 DIAGNOSIS — B964 Proteus (mirabilis) (morganii) as the cause of diseases classified elsewhere: Secondary | ICD-10-CM | POA: Diagnosis not present

## 2018-09-27 DIAGNOSIS — L89623 Pressure ulcer of left heel, stage 3: Secondary | ICD-10-CM | POA: Diagnosis not present

## 2018-09-28 DIAGNOSIS — E1151 Type 2 diabetes mellitus with diabetic peripheral angiopathy without gangrene: Secondary | ICD-10-CM | POA: Diagnosis not present

## 2018-09-28 DIAGNOSIS — L89613 Pressure ulcer of right heel, stage 3: Secondary | ICD-10-CM | POA: Diagnosis not present

## 2018-09-28 DIAGNOSIS — Z4800 Encounter for change or removal of nonsurgical wound dressing: Secondary | ICD-10-CM | POA: Diagnosis not present

## 2018-09-28 DIAGNOSIS — L89623 Pressure ulcer of left heel, stage 3: Secondary | ICD-10-CM | POA: Diagnosis not present

## 2018-09-28 DIAGNOSIS — Z89421 Acquired absence of other right toe(s): Secondary | ICD-10-CM | POA: Diagnosis not present

## 2018-09-28 DIAGNOSIS — B964 Proteus (mirabilis) (morganii) as the cause of diseases classified elsewhere: Secondary | ICD-10-CM | POA: Diagnosis not present

## 2018-09-30 DIAGNOSIS — Z89421 Acquired absence of other right toe(s): Secondary | ICD-10-CM | POA: Diagnosis not present

## 2018-09-30 DIAGNOSIS — E1151 Type 2 diabetes mellitus with diabetic peripheral angiopathy without gangrene: Secondary | ICD-10-CM | POA: Diagnosis not present

## 2018-09-30 DIAGNOSIS — Z4800 Encounter for change or removal of nonsurgical wound dressing: Secondary | ICD-10-CM | POA: Diagnosis not present

## 2018-09-30 DIAGNOSIS — L89613 Pressure ulcer of right heel, stage 3: Secondary | ICD-10-CM | POA: Diagnosis not present

## 2018-09-30 DIAGNOSIS — L89623 Pressure ulcer of left heel, stage 3: Secondary | ICD-10-CM | POA: Diagnosis not present

## 2018-09-30 DIAGNOSIS — B964 Proteus (mirabilis) (morganii) as the cause of diseases classified elsewhere: Secondary | ICD-10-CM | POA: Diagnosis not present

## 2018-10-01 DIAGNOSIS — Z4800 Encounter for change or removal of nonsurgical wound dressing: Secondary | ICD-10-CM | POA: Diagnosis not present

## 2018-10-01 DIAGNOSIS — Z89421 Acquired absence of other right toe(s): Secondary | ICD-10-CM | POA: Diagnosis not present

## 2018-10-01 DIAGNOSIS — Z7952 Long term (current) use of systemic steroids: Secondary | ICD-10-CM | POA: Diagnosis not present

## 2018-10-01 DIAGNOSIS — Z7901 Long term (current) use of anticoagulants: Secondary | ICD-10-CM | POA: Diagnosis not present

## 2018-10-01 DIAGNOSIS — B964 Proteus (mirabilis) (morganii) as the cause of diseases classified elsewhere: Secondary | ICD-10-CM | POA: Diagnosis not present

## 2018-10-01 DIAGNOSIS — E1169 Type 2 diabetes mellitus with other specified complication: Secondary | ICD-10-CM | POA: Diagnosis not present

## 2018-10-01 DIAGNOSIS — E1151 Type 2 diabetes mellitus with diabetic peripheral angiopathy without gangrene: Secondary | ICD-10-CM | POA: Diagnosis not present

## 2018-10-01 DIAGNOSIS — M86171 Other acute osteomyelitis, right ankle and foot: Secondary | ICD-10-CM | POA: Diagnosis not present

## 2018-10-01 DIAGNOSIS — Z5181 Encounter for therapeutic drug level monitoring: Secondary | ICD-10-CM | POA: Diagnosis not present

## 2018-10-01 DIAGNOSIS — Z792 Long term (current) use of antibiotics: Secondary | ICD-10-CM | POA: Diagnosis not present

## 2018-10-01 DIAGNOSIS — Z794 Long term (current) use of insulin: Secondary | ICD-10-CM | POA: Diagnosis not present

## 2018-10-01 DIAGNOSIS — Z452 Encounter for adjustment and management of vascular access device: Secondary | ICD-10-CM | POA: Diagnosis not present

## 2018-10-01 DIAGNOSIS — L89613 Pressure ulcer of right heel, stage 3: Secondary | ICD-10-CM | POA: Diagnosis not present

## 2018-10-01 DIAGNOSIS — L89623 Pressure ulcer of left heel, stage 3: Secondary | ICD-10-CM | POA: Diagnosis not present

## 2018-10-03 DIAGNOSIS — E1151 Type 2 diabetes mellitus with diabetic peripheral angiopathy without gangrene: Secondary | ICD-10-CM | POA: Diagnosis not present

## 2018-10-03 DIAGNOSIS — L89623 Pressure ulcer of left heel, stage 3: Secondary | ICD-10-CM | POA: Diagnosis not present

## 2018-10-03 DIAGNOSIS — B964 Proteus (mirabilis) (morganii) as the cause of diseases classified elsewhere: Secondary | ICD-10-CM | POA: Diagnosis not present

## 2018-10-03 DIAGNOSIS — E1169 Type 2 diabetes mellitus with other specified complication: Secondary | ICD-10-CM | POA: Diagnosis not present

## 2018-10-03 DIAGNOSIS — L89613 Pressure ulcer of right heel, stage 3: Secondary | ICD-10-CM | POA: Diagnosis not present

## 2018-10-03 DIAGNOSIS — M86171 Other acute osteomyelitis, right ankle and foot: Secondary | ICD-10-CM | POA: Diagnosis not present

## 2018-10-04 ENCOUNTER — Ambulatory Visit: Payer: Medicare Other | Attending: Infectious Diseases | Admitting: Infectious Diseases

## 2018-10-04 ENCOUNTER — Inpatient Hospital Stay
Admission: AD | Admit: 2018-10-04 | Discharge: 2018-10-11 | DRG: 674 | Disposition: A | Discharge status: Home Health | Payer: Medicare Other | Source: Ambulatory Visit | Attending: Internal Medicine | Admitting: Internal Medicine

## 2018-10-04 ENCOUNTER — Other Ambulatory Visit: Payer: Self-pay

## 2018-10-04 ENCOUNTER — Encounter: Payer: Self-pay | Admitting: Infectious Diseases

## 2018-10-04 VITALS — BP 163/88 | HR 81 | Temp 97.1°F | Ht 70.0 in | Wt 217.0 lb

## 2018-10-04 DIAGNOSIS — D631 Anemia in chronic kidney disease: Secondary | ICD-10-CM | POA: Diagnosis present

## 2018-10-04 DIAGNOSIS — R197 Diarrhea, unspecified: Secondary | ICD-10-CM | POA: Diagnosis not present

## 2018-10-04 DIAGNOSIS — E1122 Type 2 diabetes mellitus with diabetic chronic kidney disease: Secondary | ICD-10-CM | POA: Diagnosis present

## 2018-10-04 DIAGNOSIS — L97429 Non-pressure chronic ulcer of left heel and midfoot with unspecified severity: Secondary | ICD-10-CM | POA: Diagnosis present

## 2018-10-04 DIAGNOSIS — R7989 Other specified abnormal findings of blood chemistry: Secondary | ICD-10-CM

## 2018-10-04 DIAGNOSIS — Z792 Long term (current) use of antibiotics: Secondary | ICD-10-CM | POA: Diagnosis not present

## 2018-10-04 DIAGNOSIS — Z8673 Personal history of transient ischemic attack (TIA), and cerebral infarction without residual deficits: Secondary | ICD-10-CM

## 2018-10-04 DIAGNOSIS — I480 Paroxysmal atrial fibrillation: Secondary | ICD-10-CM | POA: Diagnosis present

## 2018-10-04 DIAGNOSIS — E1140 Type 2 diabetes mellitus with diabetic neuropathy, unspecified: Secondary | ICD-10-CM | POA: Diagnosis present

## 2018-10-04 DIAGNOSIS — M869 Osteomyelitis, unspecified: Secondary | ICD-10-CM | POA: Diagnosis not present

## 2018-10-04 DIAGNOSIS — M1A9XX0 Chronic gout, unspecified, without tophus (tophi): Secondary | ICD-10-CM | POA: Diagnosis present

## 2018-10-04 DIAGNOSIS — Z833 Family history of diabetes mellitus: Secondary | ICD-10-CM

## 2018-10-04 DIAGNOSIS — E1151 Type 2 diabetes mellitus with diabetic peripheral angiopathy without gangrene: Secondary | ICD-10-CM | POA: Diagnosis present

## 2018-10-04 DIAGNOSIS — E11621 Type 2 diabetes mellitus with foot ulcer: Secondary | ICD-10-CM

## 2018-10-04 DIAGNOSIS — M86171 Other acute osteomyelitis, right ankle and foot: Secondary | ICD-10-CM | POA: Diagnosis present

## 2018-10-04 DIAGNOSIS — T8249XA Other complication of vascular dialysis catheter, initial encounter: Secondary | ICD-10-CM | POA: Diagnosis present

## 2018-10-04 DIAGNOSIS — Z89411 Acquired absence of right great toe: Secondary | ICD-10-CM

## 2018-10-04 DIAGNOSIS — Z452 Encounter for adjustment and management of vascular access device: Secondary | ICD-10-CM | POA: Diagnosis not present

## 2018-10-04 DIAGNOSIS — E1169 Type 2 diabetes mellitus with other specified complication: Secondary | ICD-10-CM | POA: Diagnosis present

## 2018-10-04 DIAGNOSIS — I2510 Atherosclerotic heart disease of native coronary artery without angina pectoris: Secondary | ICD-10-CM

## 2018-10-04 DIAGNOSIS — Z8249 Family history of ischemic heart disease and other diseases of the circulatory system: Secondary | ICD-10-CM

## 2018-10-04 DIAGNOSIS — N186 End stage renal disease: Secondary | ICD-10-CM | POA: Diagnosis present

## 2018-10-04 DIAGNOSIS — N170 Acute kidney failure with tubular necrosis: Principal | ICD-10-CM | POA: Diagnosis present

## 2018-10-04 DIAGNOSIS — Z95828 Presence of other vascular implants and grafts: Secondary | ICD-10-CM

## 2018-10-04 DIAGNOSIS — L97419 Non-pressure chronic ulcer of right heel and midfoot with unspecified severity: Secondary | ICD-10-CM

## 2018-10-04 DIAGNOSIS — L89610 Pressure ulcer of right heel, unstageable: Secondary | ICD-10-CM | POA: Diagnosis not present

## 2018-10-04 DIAGNOSIS — N189 Chronic kidney disease, unspecified: Secondary | ICD-10-CM | POA: Diagnosis not present

## 2018-10-04 DIAGNOSIS — Z111 Encounter for screening for respiratory tuberculosis: Secondary | ICD-10-CM

## 2018-10-04 DIAGNOSIS — Z89421 Acquired absence of other right toe(s): Secondary | ICD-10-CM

## 2018-10-04 DIAGNOSIS — D649 Anemia, unspecified: Secondary | ICD-10-CM | POA: Diagnosis not present

## 2018-10-04 DIAGNOSIS — E875 Hyperkalemia: Secondary | ICD-10-CM | POA: Diagnosis not present

## 2018-10-04 DIAGNOSIS — Z9049 Acquired absence of other specified parts of digestive tract: Secondary | ICD-10-CM

## 2018-10-04 DIAGNOSIS — L97919 Non-pressure chronic ulcer of unspecified part of right lower leg with unspecified severity: Secondary | ICD-10-CM | POA: Diagnosis not present

## 2018-10-04 DIAGNOSIS — Z794 Long term (current) use of insulin: Secondary | ICD-10-CM

## 2018-10-04 DIAGNOSIS — Z951 Presence of aortocoronary bypass graft: Secondary | ICD-10-CM

## 2018-10-04 DIAGNOSIS — N179 Acute kidney failure, unspecified: Secondary | ICD-10-CM | POA: Diagnosis not present

## 2018-10-04 DIAGNOSIS — T829XXA Unspecified complication of cardiac and vascular prosthetic device, implant and graft, initial encounter: Secondary | ICD-10-CM | POA: Diagnosis not present

## 2018-10-04 DIAGNOSIS — I132 Hypertensive heart and chronic kidney disease with heart failure and with stage 5 chronic kidney disease, or end stage renal disease: Secondary | ICD-10-CM | POA: Diagnosis present

## 2018-10-04 DIAGNOSIS — M5137 Other intervertebral disc degeneration, lumbosacral region: Secondary | ICD-10-CM | POA: Diagnosis present

## 2018-10-04 DIAGNOSIS — L97411 Non-pressure chronic ulcer of right heel and midfoot limited to breakdown of skin: Secondary | ICD-10-CM | POA: Diagnosis not present

## 2018-10-04 DIAGNOSIS — I10 Essential (primary) hypertension: Secondary | ICD-10-CM

## 2018-10-04 DIAGNOSIS — G4733 Obstructive sleep apnea (adult) (pediatric): Secondary | ICD-10-CM | POA: Diagnosis present

## 2018-10-04 DIAGNOSIS — I255 Ischemic cardiomyopathy: Secondary | ICD-10-CM | POA: Diagnosis present

## 2018-10-04 DIAGNOSIS — B9562 Methicillin resistant Staphylococcus aureus infection as the cause of diseases classified elsewhere: Secondary | ICD-10-CM

## 2018-10-04 DIAGNOSIS — I5022 Chronic systolic (congestive) heart failure: Secondary | ICD-10-CM | POA: Diagnosis present

## 2018-10-04 DIAGNOSIS — I129 Hypertensive chronic kidney disease with stage 1 through stage 4 chronic kidney disease, or unspecified chronic kidney disease: Secondary | ICD-10-CM | POA: Diagnosis not present

## 2018-10-04 DIAGNOSIS — Z888 Allergy status to other drugs, medicaments and biological substances status: Secondary | ICD-10-CM

## 2018-10-04 DIAGNOSIS — E11628 Type 2 diabetes mellitus with other skin complications: Secondary | ICD-10-CM | POA: Diagnosis present

## 2018-10-04 DIAGNOSIS — L089 Local infection of the skin and subcutaneous tissue, unspecified: Secondary | ICD-10-CM | POA: Diagnosis present

## 2018-10-04 DIAGNOSIS — L89609 Pressure ulcer of unspecified heel, unspecified stage: Secondary | ICD-10-CM

## 2018-10-04 DIAGNOSIS — Z992 Dependence on renal dialysis: Secondary | ICD-10-CM | POA: Diagnosis not present

## 2018-10-04 DIAGNOSIS — L97421 Non-pressure chronic ulcer of left heel and midfoot limited to breakdown of skin: Secondary | ICD-10-CM | POA: Diagnosis not present

## 2018-10-04 DIAGNOSIS — Z886 Allergy status to analgesic agent status: Secondary | ICD-10-CM

## 2018-10-04 DIAGNOSIS — Z95810 Presence of automatic (implantable) cardiac defibrillator: Secondary | ICD-10-CM

## 2018-10-04 DIAGNOSIS — N184 Chronic kidney disease, stage 4 (severe): Secondary | ICD-10-CM | POA: Diagnosis not present

## 2018-10-04 DIAGNOSIS — L89619 Pressure ulcer of right heel, unspecified stage: Secondary | ICD-10-CM | POA: Diagnosis not present

## 2018-10-04 DIAGNOSIS — B9689 Other specified bacterial agents as the cause of diseases classified elsewhere: Secondary | ICD-10-CM

## 2018-10-04 DIAGNOSIS — I252 Old myocardial infarction: Secondary | ICD-10-CM

## 2018-10-04 LAB — GLUCOSE, CAPILLARY: Glucose-Capillary: 187 mg/dL — ABNORMAL HIGH (ref 70–99)

## 2018-10-04 MED ORDER — ONDANSETRON HCL 4 MG/2ML IJ SOLN: 4.0000 mg | Freq: Four times a day (QID) | INTRAMUSCULAR | Status: DC | PRN

## 2018-10-04 MED ORDER — DAPTOMYCIN IV (FOR PTA / DISCHARGE USE ONLY): 750.0000 mg | INTRAVENOUS | Status: DC

## 2018-10-04 MED ORDER — ALLOPURINOL 100 MG PO TABS
100.0000 mg | ORAL_TABLET | Freq: Every day | ORAL | Status: DC
  Administered 2018-10-05 – 2018-10-11 (×7): 100 mg via ORAL
  Filled 2018-10-04 (×7): qty 1

## 2018-10-04 MED ORDER — SODIUM CHLORIDE 0.9 % IV SOLN
INTRAVENOUS | Status: DC | PRN
  Administered 2018-10-04: 22:00:00 30 mL via INTRAVENOUS
  Administered 2018-10-08: 23:00:00 250 mL via INTRAVENOUS
  Administered 2018-10-10: 20 mL via INTRAVENOUS

## 2018-10-04 MED ORDER — SODIUM CHLORIDE 0.9 % IV SOLN
750.0000 mg | Freq: Every day | INTRAVENOUS | Status: DC
  Administered 2018-10-04: 750 mg via INTRAVENOUS
  Filled 2018-10-04 (×2): qty 15

## 2018-10-04 MED ORDER — ONDANSETRON HCL 4 MG PO TABS: 4.0000 mg | ORAL_TABLET | Freq: Four times a day (QID) | ORAL | Status: DC | PRN

## 2018-10-04 MED ORDER — POLYETHYLENE GLYCOL 3350 17 G PO PACK: 17.0000 g | PACK | Freq: Every day | ORAL | Status: DC | PRN

## 2018-10-04 MED ORDER — CLOPIDOGREL BISULFATE 75 MG PO TABS
75.0000 mg | ORAL_TABLET | Freq: Every day | ORAL | Status: DC
  Administered 2018-10-05 – 2018-10-06 (×2): 75 mg via ORAL
  Filled 2018-10-04 (×2): qty 1

## 2018-10-04 MED ORDER — INSULIN GLARGINE 100 UNIT/ML ~~LOC~~ SOLN
10.0000 [IU] | Freq: Every day | SUBCUTANEOUS | Status: DC
  Administered 2018-10-04: 10 [IU] via SUBCUTANEOUS
  Filled 2018-10-04 (×2): qty 0.1

## 2018-10-04 MED ORDER — CARVEDILOL 3.125 MG PO TABS
3.1250 mg | ORAL_TABLET | Freq: Two times a day (BID) | ORAL | Status: DC
  Administered 2018-10-04 – 2018-10-11 (×14): 3.125 mg via ORAL
  Filled 2018-10-04 (×14): qty 1

## 2018-10-04 MED ORDER — SODIUM CHLORIDE 0.9 % IV SOLN
2.0000 g | INTRAVENOUS | Status: DC
  Administered 2018-10-04: 22:00:00 2 g via INTRAVENOUS
  Filled 2018-10-04: qty 20
  Filled 2018-10-04: qty 2

## 2018-10-04 MED ORDER — ACETAMINOPHEN 650 MG RE SUPP: 650.0000 mg | Freq: Four times a day (QID) | RECTAL | Status: DC | PRN

## 2018-10-04 MED ORDER — PREDNISONE 10 MG PO TABS
5.0000 mg | ORAL_TABLET | Freq: Every day | ORAL | Status: DC
  Administered 2018-10-05 – 2018-10-11 (×7): 5 mg via ORAL
  Filled 2018-10-04 (×7): qty 1

## 2018-10-04 MED ORDER — ASPIRIN EC 81 MG PO TBEC
81.0000 mg | DELAYED_RELEASE_TABLET | Freq: Every day | ORAL | Status: DC
  Administered 2018-10-05 – 2018-10-11 (×7): 81 mg via ORAL
  Filled 2018-10-04 (×7): qty 1

## 2018-10-04 MED ORDER — VITAMIN D3 25 MCG (1000 UNIT) PO TABS
5000.0000 [IU] | ORAL_TABLET | ORAL | Status: DC
  Filled 2018-10-04 (×5): qty 5

## 2018-10-04 MED ORDER — ACETAMINOPHEN 325 MG PO TABS
650.0000 mg | ORAL_TABLET | Freq: Four times a day (QID) | ORAL | Status: DC | PRN
  Administered 2018-10-04 – 2018-10-07 (×2): 650 mg via ORAL
  Filled 2018-10-04: qty 2

## 2018-10-04 MED ORDER — INSULIN ASPART 100 UNIT/ML ~~LOC~~ SOLN: 0.0000 [IU] | Freq: Every day | SUBCUTANEOUS | Status: DC

## 2018-10-04 MED ORDER — CEFTRIAXONE IV (FOR PTA / DISCHARGE USE ONLY): 2.0000 g | INTRAVENOUS | Status: DC

## 2018-10-04 MED ORDER — OXYCODONE HCL 5 MG PO TABS: 5.0000 mg | ORAL_TABLET | ORAL | Status: DC | PRN

## 2018-10-04 MED ORDER — SODIUM CHLORIDE 0.9 % IV SOLN
INTRAVENOUS | Status: DC
  Administered 2018-10-04 – 2018-10-06 (×4): via INTRAVENOUS

## 2018-10-04 MED ORDER — AMIODARONE HCL 200 MG PO TABS
200.0000 mg | ORAL_TABLET | Freq: Every day | ORAL | Status: DC
  Administered 2018-10-05 – 2018-10-11 (×7): 200 mg via ORAL
  Filled 2018-10-04 (×7): qty 1

## 2018-10-04 MED ORDER — HEPARIN SODIUM (PORCINE) 5000 UNIT/ML IJ SOLN
5000.0000 [IU] | Freq: Three times a day (TID) | INTRAMUSCULAR | Status: DC
  Administered 2018-10-05: 5000 [IU] via SUBCUTANEOUS
  Filled 2018-10-04 (×2): qty 1

## 2018-10-04 MED ORDER — INSULIN ASPART 100 UNIT/ML ~~LOC~~ SOLN
0.0000 [IU] | Freq: Three times a day (TID) | SUBCUTANEOUS | Status: DC
  Administered 2018-10-05: 13:00:00 2 [IU] via SUBCUTANEOUS
  Filled 2018-10-04: qty 1

## 2018-10-04 MED ORDER — GABAPENTIN 100 MG PO CAPS
200.0000 mg | ORAL_CAPSULE | Freq: Three times a day (TID) | ORAL | Status: DC
  Administered 2018-10-04: 21:00:00 200 mg via ORAL
  Filled 2018-10-04 (×2): qty 2

## 2018-10-04 MED ORDER — METRONIDAZOLE 500 MG PO TABS
500.0000 mg | ORAL_TABLET | Freq: Two times a day (BID) | ORAL | Status: DC
  Administered 2018-10-04 – 2018-10-11 (×13): 500 mg via ORAL
  Filled 2018-10-04 (×13): qty 1

## 2018-10-04 NOTE — H&P (Addendum)
[facility] [facility] [place] NAME: Paul [name]    MR#:  [id number]  DATE OF BIRTH:  1965-11-03  DATE OF ADMISSION:  10/04/2018  PRIMARY CARE PHYSICIAN: Tracie Harrier, MD   REQUESTING/REFERRING PHYSICIAN: Tsosie Billing, MD  CHIEF COMPLAINT:   AKI   HISTORY OF PRESENT ILLNESS:  Paul [name]  is a 53 y.o. male with a known history of CAD s/p CABG x3 with cardiac defibrillator in place, carotid artery disease, chronic systolic heart failure, hypertension, peripheral vascular disease, type 2 diabetes, OSA, history of stroke who was directly admitted to the hospital from infectious disease clinic due to AKI.  Patient was recently admitted from 5/4-5/11 with acute osteomyelitis of the right foot.  He underwent excision of an infected necrotic right Achilles tendon and excision of right posterior calcaneus 5/7 by podiatry and angiogram with angioplasty to right popliteal 5/8 by vascular surgery.  He was discharged home on daptomycin IV, ceftriaxone IV, and Flagyl p.o. for 4 weeks per ID recommendations.  Patient followed up in ID clinic today and had some basic labs drawn.  Creatinine was found to be 8.65.  Patient was admitted to the hospital for further management.  He endorses decreased p.o. intake over the last couple of weeks, both solids and fluids.  He also states he has been feeling less.  He denies any chest pain, shortness breath, abdominal pain.  He has not been taking any NSAIDs.  PAST MEDICAL HISTORY:   Past Medical History:  Diagnosis Date   Anemia    Cardiac defibrillator in place    a. Biotronik LUmax 540 DRT, (ser # [id number]).   Carotid arterial disease ([place])    a. s/p prior LICA stenting;  b. 01/4800 Carotid U/S: 40-59% bilat ICA stenosis. Patent LICA stent.   CHF (congestive heart failure) (HCC)    CKD (chronic kidney disease), stage III ([place])    Coronary artery disease    a. 2010 s/p CABG x 3.   DDD (degenerative disc  disease), lumbosacral    L5-S1   Diabetes ([place])    Lantus at bedtime   Gangrene of toe of right foot ([place])    Gout of left hand 10/06/2016   HFrEF (heart failure with reduced ejection fraction) ([place])    a. 07/2016 Echo: EF 25-30%, diff HK, mild MR, mildly dil LA, mod reduced RV fxn, PASP 68mHg.   Hypertension    takes Coreg daily   Ischemic cardiomyopathy    a. 07/2016 Echo: EF 25-30%, diff HK.   Myocardial infarction ([facility] 2009   Peripheral vascular disease ([place]    Sleep apnea    sleep study yr ago-unable to afford cpap   Stroke ([facility] 09   no weakness    PAST SURGICAL HISTORY:   Past Surgical History:  Procedure Laterality Date   AMPUTATION TOE Right 08/22/2016   Procedure: AMPUTATION TOE;  Surgeon: JSamara Deist DPM;  Location: [facility];  Service: Podiatry;  Laterality: Right;   AMPUTATION TOE Right 10/09/2016   Procedure: AMPUTATION TOE-RIGHT 2ND MPJ;  Surgeon: FSamara Deist DPM;  Location: [facility];  Service: Podiatry;  Laterality: Right;   APLIGRAFT PLACEMENT Right 10/09/2016   Procedure: APLIGRAFT PLACEMENT;  Surgeon: FSamara Deist DPM;  Location: [facility];  Service: Podiatry;  Laterality: Right;   CALCANEAL OSTEOTOMY Bilateral 07/28/2018   Procedure: RIGHT CALCANECTOMY BILATERAL DEBRIDEMENT OF ULCERS ON HEELS;  Surgeon: TAlbertine Patricia DPM;  Location: [facility];  Service: Podiatry;  Laterality: Bilateral;  CARDIAC DEFIBRILLATOR PLACEMENT  2011   CAROTID ENDARTERECTOMY Left    CHOLECYSTECTOMY  2010   CORONARY ARTERY BYPASS GRAFT  2010   CABG x 3    GRAFT APPLICATION Right 2/70/7867   Procedure: FULL THICKNESS SKIN GRAFT-RIGHT FOOT;  Surgeon: Algernon Huxley, MD;  Location: [facility];  Service: Vascular;  Laterality: Right;   HIP SURGERY Right [place] Right 09/08/2018   Procedure: INCISION AND DRAINAGE - [place] OF DEFECTIVE SKIN, SOFT TISSUE AND BONE;  Surgeon: Albertine Patricia, DPM;  Location: [facility];  Service: Podiatry;   Laterality: Right;   INCISION AND DRAINAGE OF WOUND Right 08/22/2016   Procedure: IRRIGATION AND DEBRIDEMENT WOUND and wound vac placement;  Surgeon: Samara Deist, DPM;  Location: [facility];  Service: Podiatry;  Laterality: Right;   LOWER EXTREMITY ANGIOGRAPHY Right 08/24/2016   Procedure: Lower Extremity Angiography;  Surgeon: Algernon Huxley, MD;  Location: [place] [facility];  Service: Cardiovascular;  Laterality: Right;   LOWER EXTREMITY ANGIOGRAPHY Right 07/27/2018   Procedure: RIGHT Lower Extremity Angiography;  Surgeon: Algernon Huxley, MD;  Location: [place] [facility];  Service: Cardiovascular;  Laterality: Right;   LOWER EXTREMITY ANGIOGRAPHY Right 09/09/2018   Procedure: Lower Extremity Angiography;  Surgeon: Katha Cabal, MD;  Location: [place] [facility];  Service: Cardiovascular;  Laterality: Right;   MASS EXCISION Right 07/18/2014   Procedure: EXCISION HETEROTOPIC BONE RIGHT HIP;  Surgeon: Frederik Pear, MD;  Location: [place];  Service: Orthopedics;  Laterality: Right;   Open Heart Surgery  2010   x 3   WOUND DEBRIDEMENT Right 10/09/2016   Procedure: DEBRIDEMENT WOUND;  Surgeon: Samara Deist, DPM;  Location: [facility];  Service: Podiatry;  Laterality: Right;    SOCIAL HISTORY:   Social History   Tobacco Use   Smoking status: Never Smoker   Smokeless tobacco: Never Used  Substance Use Topics   Alcohol use: No    FAMILY HISTORY:   Family History  Problem Relation Age of Onset   Hypertension Other    Diabetes Other    Diabetes Father    Hyperlipidemia Father    Hypertension Father    Hyperlipidemia Sister    Hypertension Sister    Diabetes Sister    Diabetes Brother    Hypertension Brother    Hyperlipidemia Brother     DRUG ALLERGIES:   Allergies  Allergen Reactions   Other Other (See Comments)    Cardiac Problems. Pt states he tolerates Toradol. Due to kidney and heart problems per pt   Ibuprofen Other (See Comments)    Heart  problems   Baclofen Other (See Comments)   Metformin Diarrhea   Nsaids     Due to kidney and heart problems per pt    REVIEW OF SYSTEMS:   Review of Systems  Constitutional: Negative for chills and fever.  HENT: Negative for congestion and sore throat.   Eyes: Negative for blurred vision.  Respiratory: Negative for cough and shortness of breath.   Cardiovascular: Negative for chest pain and palpitations.  Gastrointestinal: Negative for abdominal pain, nausea and vomiting.  Genitourinary: Negative for dysuria and urgency.  Musculoskeletal: Positive for joint pain. Negative for back pain and neck pain.  Neurological: Negative for dizziness and headaches.  Psychiatric/Behavioral: Negative for depression. The patient is not nervous/anxious.     MEDICATIONS AT HOME:   Prior to Admission medications   Medication Sig Start Date End Date Taking? Authorizing Provider  acetaminophen (TYLENOL) 325  MG tablet Take 2 tablets (650 mg total) by mouth every 6 (six) hours as needed for mild pain (or Fever >/= 101). 05/29/18   Loletha Grayer, MD  acetaminophen (TYLENOL) 500 MG tablet Take 500 mg by mouth every 6 (six) hours as needed.    [provider]  allopurinol (ZYLOPRIM) 100 MG tablet Take 1 tablet (100 mg total) by mouth daily. 08/02/18   Fritzi Mandes, MD  amiodarone (PACERONE) 200 MG tablet Take 200 mg by mouth daily. Take 200 mg twice a day for ten days, then take 200 mg once a day thereafter. 01/15/18   [provider]  aspirin EC 81 MG tablet Take 1 tablet (81 mg total) by mouth daily. 10/21/16   Theora Gianotti, NP  carvedilol (COREG) 3.125 MG tablet Take 1 tablet (3.125 mg total) by mouth 2 (two) times daily. 05/29/18   Loletha Grayer, MD  cefTRIAXone (ROCEPHIN) IVPB Inject 2 g into the vein daily for 27 days. Indication:  Diabetic foot with osteomyelitis Last Day of Therapy:  10/10/18 Labs weekly on monday while on IV antibiotics:  _X_ CBC with differential X  CMP X CPK Labs every other Monday while on antibiotic _X__ CRP _X_ ESR 09/13/18 10/10/18  Shepard Keltz, Pete Pelt, MD  Cholecalciferol (VITAMIN D3) 1.25 MG (50000 UT) CAPS Take 1 capsule by mouth once a week. 07/08/18   [provider]  clopidogrel (PLAVIX) 75 MG tablet Take 1 tablet (75 mg total) by mouth daily with breakfast. 09/12/18   Jesscia Imm, Pete Pelt, MD  daptomycin (CUBICIN) IVPB Inject 750 mg into the vein daily for 27 days. Indication:  Diabetic foot with osteomyelitis Last Day of Therapy:  10/10/18 Labs weekly on monday while on IV antibiotics:  _X_ CBC with differential X CMP X CPK Labs every other Monday while on antibiotic _X__ CRP _X_ ESR 09/13/18 10/10/18  Kadija Cruzen, Pete Pelt, MD  gabapentin (NEURONTIN) 100 MG capsule Take 200 mg by mouth 3 (three) times daily.  03/03/18   [provider]  insulin glargine (LANTUS) 100 unit/mL SOPN Inject 0.2 mLs (20 Units total) into the skin at bedtime. 05/29/18   Loletha Grayer, MD  metroNIDAZOLE (FLAGYL) 500 MG tablet Take 1 tablet (500 mg total) by mouth 2 (two) times daily for 28 days. 09/12/18 10/10/18  Adel Burch, Pete Pelt, MD  oxyCODONE (OXY IR/ROXICODONE) 5 MG immediate release tablet Take 1 tablet (5 mg total) by mouth every 4 (four) hours as needed for moderate pain. Patient not taking: Reported on 10/04/2018 09/12/18   Piero Mustard, Pete Pelt, MD  predniSONE (DELTASONE) 5 MG tablet Take 1 tablet (5 mg total) by mouth daily with breakfast. 08/02/18   Fritzi Mandes, MD  torsemide (DEMADEX) 20 MG tablet Take 20-40 mg by mouth 2 (two) times daily. Take 40 mg (two tablets) in the morning and 20 mg (1 tablet) in the evening. 08/22/18   [provider]      VITAL SIGNS:  Blood pressure (!) 157/87, pulse 86, temperature 98.8 F (37.1 C), resp. rate 16, SpO2 99 %.  PHYSICAL EXAMINATION:  Physical Exam  GENERAL:  54 y.o.-year-old patient lying in the bed with no acute distress.  EYES: Pupils equal, round, reactive to light and accommodation. No  scleral icterus. Extraocular muscles intact.  HEENT: Head atraumatic, normocephalic. Oropharynx and nasopharynx clear. + Dry mucous membranes NECK:  Supple, no jugular venous distention. No thyroid enlargement, no tenderness.  LUNGS: Normal breath sounds bilaterally, no wheezing, rales,rhonchi or crepitation. No use of accessory muscles  of respiration.  CARDIOVASCULAR: RRR, S1, S2 normal. No murmurs, rubs, or gallops.  ABDOMEN: Soft, nontender, nondistended. Bowel sounds present. No organomegaly or mass.  EXTREMITIES: No pedal edema, cyanosis, or clubbing.  S/p amputation of all toes on the right foot NEUROLOGIC: Cranial nerves II through XII are intact. Muscle strength 5/5 in all extremities. Sensation intact. Gait not checked.  PSYCHIATRIC: The patient is alert and oriented x 3.  SKIN: + Chronic foot wounds located on the heel bilaterally.  No surrounding erythema or drainage.  See pictures below. LEFT ^^  RIGHT^^  LABORATORY PANEL:   CBC No results for input(s): WBC, HGB, HCT, PLT in the last 168 hours. ------------------------------------------------------------------------------------------------------------------  Chemistries  No results for input(s): NA, K, CL, CO2, GLUCOSE, BUN, CREATININE, CALCIUM, MG, AST, ALT, ALKPHOS, BILITOT in the last 168 hours.  Invalid input(s): GFRCGP ------------------------------------------------------------------------------------------------------------------  Cardiac Enzymes No results for input(s): TROPONINI in the last 168 hours. ------------------------------------------------------------------------------------------------------------------  RADIOLOGY:  No results found.    IMPRESSION AND PLAN:   AKI in CKD VI- likely due to decreased p.o. intake. No NSAID use. 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 Elvina Mattes as an outpatient. -Continue daptomycin, ceftriaxone, and Flagyl through 6/8 -Podiatry consult  HTN- BPs elevated on admission -Continue home antihypertensives  Normocytic anemia- likely iron deficiency and anemia of chronic kidney disease- hgb at baseline. -Monitor  Type 2 diabetes -Lantus and SSI  CAD-stable, no active chest pain  -Continue aspirin and plavix  Chronic systolic CHF-stable, no signs of acute exacerbation. Recent ECHO 05/25/18 with EF 25-30%. -Holding home toresmide with AKI -Will need to watch volume status closely with IV fluids  Paroxysmal atrial fibrillation- in NSR here -Continue amiodarone -Patient previously prescribed eliquis, but never filled the prescription due to expense -Continue aspirin and Plavix -Needs anticoagulation re-addressed as an outpatient   All the records are reviewed and case discussed with ED provider. Management plans discussed with the patient, family and they are in agreement.  CODE STATUS: Full  TOTAL TIME TAKING CARE OF THIS PATIENT: 45 minutes.    Berna Spare Shamirah Ivan M.D on 10/04/2018 at 7:30 PM  Between 7am to 6pm - Pager [phone]  After 6pm go to [email or web address] - Proofreader  [facility]  Office  [phone]  CC: Primary care physician; Tracie Harrier, MD   Note: This dictation was prepared with Dragon dictation along with smaller phrase technology. Any transcriptional errors that result from this process are unintentional.

## 2018-10-04 NOTE — Patient Instructions (Signed)
You will need to be admitted ot the hopsital as your creatinine has increased to 8 that means your kidney function is not good- The bed control will call you-if you dont hear from them in the next 3 hours please call. [phone].

## 2018-10-04 NOTE — Progress Notes (Signed)
NAME: Paul [name].  DOB: [birth year]  MRN: [id number]  Date/Time: 10/04/2018 12:06 PM   Subjective:  [name] [age]-year-old male with history of diabetes mellitus, hypertension, coronary artery disease, diabetic foot infection, rotation of all the toes in the right foot, bilateral heel ulcers which has been chronic, was admitted to the hospital on 09/05/2018 with worsening infection of the right heel ulcer.  He underwent excision of the infected necrotic Achilles tendon on the right side as well as excision of the posterior calcaneal bone.  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  He was discharged home on IV daptomycin and IV ceftriaxone and p.o. linezolid to complete 4 weeks of antibiotics on 10/10/2018.  On discharge his creatinine was 2.3 but it continued to go up during weekly labs and the daptomycin was adjusted to every 48 hours.  On 5/25 the creatinine was 4.41.  I will asked the patient to hold on all his antibiotics both daptomycin and ceftriaxone for the last 3 4 days.  I communicated with Dr. Candiss Norse and he was supposed to follow him as outpatient.  Today he is in my clinic to review the wounds.  On examination of the wounds the right foot looks better the left foot is almost healed completely. The labs that were done on 10/03/2018 yesterday shows a creatinine more than 8.  Patient has poor appetite.  He says he hardly drinks or eats.  He has no nausea or vomiting.  He does not have any shortness of breath.  He says his urine output is fluctuating.  Some days he can has a lot and other times it is a small amount.  He lives on his own. ?We will admit him to the hospital today because of the worsening creatinine.  Potassium is normal at 4  Past Medical History:  Diagnosis Date  . Anemia   . Cardiac defibrillator in place    a. Biotronik LUmax 540 DRT, (ser # [id number]).  . Carotid arterial disease ([place])    a. s/p prior LICA stenting;  b. 12/5927 Carotid U/S: 40-59% bilat ICA stenosis. Patent LICA stent.   . CHF (congestive heart failure) ([place])   . CKD (chronic kidney disease), stage III ([place])   . Coronary artery disease    a. 2010 s/p CABG x 3.  . DDD (degenerative disc disease), lumbosacral    L5-S1  . Diabetes ([place])    Lantus at bedtime  . Gangrene of toe of right foot ([place])   . Gout of left hand 10/06/2016  . HFrEF (heart failure with reduced ejection fraction) ([place])    a. 07/2016 Echo: EF 25-30%, diff HK, mild MR, mildly dil LA, mod reduced RV fxn, PASP 63mHg.  .Marland KitchenHypertension    takes Coreg daily  . Ischemic cardiomyopathy    a. 07/2016 Echo: EF 25-30%, diff HK.  . Myocardial infarction ([place] 2009  . Peripheral vascular disease ([place]   . Sleep apnea    sleep study yr ago-unable to afford cpap  . Stroke ([facility] 09   no weakness    Past Surgical History:  Procedure Laterality Date  . AMPUTATION TOE Right 08/22/2016   Procedure: AMPUTATION TOE;  Surgeon: JSamara Deist DPM;  Location: [facility];  Service: Podiatry;  Laterality: Right;  . AMPUTATION TOE Right 10/09/2016   Procedure: AMPUTATION TOE-RIGHT 2ND MPJ;  Surgeon: FSamara Deist DPM;  Location: [facility];  Service: Podiatry;  Laterality: Right;  . APLIGRAFT PLACEMENT Right 10/09/2016   Procedure: APLIGRAFT PLACEMENT;  Surgeon: Samara Deist, DPM;  Location: [facility];  Service: Podiatry;  Laterality: Right;  . CALCANEAL OSTEOTOMY Bilateral 07/28/2018   Procedure: RIGHT CALCANECTOMY BILATERAL DEBRIDEMENT OF ULCERS ON HEELS;  Surgeon: Albertine Patricia, DPM;  Location: [facility];  Service: Podiatry;  Laterality: Bilateral;  . CARDIAC DEFIBRILLATOR PLACEMENT  2011  . CAROTID ENDARTERECTOMY Left   . CHOLECYSTECTOMY  2010  . CORONARY ARTERY BYPASS GRAFT  2010   CABG x 3   . GRAFT APPLICATION Right 8/56/3149   Procedure: FULL THICKNESS SKIN GRAFT-RIGHT FOOT;  Surgeon: Algernon Huxley, MD;  Location: [facility];  Service: Vascular;  Laterality: Right;  . HIP SURGERY Right 1994  . INCISION AND DRAINAGE Right 09/08/2018   Procedure: INCISION  AND DRAINAGE - [place] OF DEFECTIVE SKIN, SOFT TISSUE AND BONE;  Surgeon: Albertine Patricia, DPM;  Location: [facility];  Service: Podiatry;  Laterality: Right;  . INCISION AND DRAINAGE OF WOUND Right 08/22/2016   Procedure: IRRIGATION AND DEBRIDEMENT WOUND and wound vac placement;  Surgeon: Samara Deist, DPM;  Location: [facility];  Service: Podiatry;  Laterality: Right;  . LOWER EXTREMITY ANGIOGRAPHY Right 08/24/2016   Procedure: Lower Extremity Angiography;  Surgeon: Algernon Huxley, MD;  Location: [place] [facility];  Service: Cardiovascular;  Laterality: Right;  . LOWER EXTREMITY ANGIOGRAPHY Right 07/27/2018   Procedure: RIGHT Lower Extremity Angiography;  Surgeon: Algernon Huxley, MD;  Location: [place] [facility];  Service: Cardiovascular;  Laterality: Right;  . LOWER EXTREMITY ANGIOGRAPHY Right 09/09/2018   Procedure: Lower Extremity Angiography;  Surgeon: Katha Cabal, MD;  Location: [place] [facility];  Service: Cardiovascular;  Laterality: Right;  . MASS EXCISION Right 07/18/2014   Procedure: EXCISION HETEROTOPIC BONE RIGHT HIP;  Surgeon: Frederik Pear, MD;  Location: [place];  Service: Orthopedics;  Laterality: Right;  . Open Heart Surgery  2010   x 3  . WOUND DEBRIDEMENT Right 10/09/2016   Procedure: DEBRIDEMENT WOUND;  Surgeon: Samara Deist, DPM;  Location: [facility];  Service: Podiatry;  Laterality: Right;    Social History   Socioeconomic History  . Marital status: Legally Separated    Spouse name: Not on file  . Number of children: Not on file  . Years of education: Not on file  . Highest education level: Not on file  Occupational History  . Not on file  Social Needs  . Financial resource strain: Not on file  . Food insecurity:    Worry: Not on file    Inability: Not on file  . Transportation needs:    Medical: Not on file    Non-medical: Not on file  Tobacco Use  . Smoking status: Never Smoker  . Smokeless tobacco: Never Used  Substance and Sexual Activity  . Alcohol  use: No  . Drug use: No  . Sexual activity: Not on file  Lifestyle  . Physical activity:    Days per week: Not on file    Minutes per session: Not on file  . Stress: Not on file  Relationships  . Social connections:    Talks on phone: Not on file    Gets together: Not on file    Attends religious service: Not on file    Active member of club or organization: Not on file    Attends meetings of clubs or organizations: Not on file    Relationship status: Not on file  . Intimate partner violence:    Fear of current or ex partner: Not on file  Emotionally abused: Not on file    Physically abused: Not on file    Forced sexual activity: Not on file  Other Topics Concern  . Not on file  Social History Narrative  . Not on file    Family History  Problem Relation Age of Onset  . Hypertension Other   . Diabetes Other   . Diabetes Father   . Hyperlipidemia Father   . Hypertension Father   . Hyperlipidemia Sister   . Hypertension Sister   . Diabetes Sister   . Diabetes Brother   . Hypertension Brother   . Hyperlipidemia Brother    Allergies  Allergen Reactions  . Other Other (See Comments)    Cardiac Problems. Pt states he tolerates Toradol. Due to kidney and heart problems per pt  . Ibuprofen Other (See Comments)    Heart problems  . Baclofen Other (See Comments)  . Metformin Diarrhea  . Nsaids     Due to kidney and heart problems per pt   ID  Recent  Procedure Surgery Injections Trauma Sick contacts Travel Antibiotic use Food- raw/exotic Steroid/immune suppressants/splenectomy/Hardware Animal bites Tick exposure Water sports Fishing/hunting/animal bird exposure ? Current Outpatient Medications  Medication Sig Dispense Refill  . acetaminophen (TYLENOL) 325 MG tablet Take 2 tablets (650 mg total) by mouth every 6 (six) hours as needed for mild pain (or Fever >/= 101).    Marland Kitchen acetaminophen (TYLENOL) 500 MG tablet Take 500 mg by mouth every 6 (six) hours as  needed.    Marland Kitchen allopurinol (ZYLOPRIM) 100 MG tablet Take 1 tablet (100 mg total) by mouth daily. 30 tablet 0  . amiodarone (PACERONE) 200 MG tablet Take 200 mg by mouth daily. Take 200 mg twice a day for ten days, then take 200 mg once a day thereafter.    Marland Kitchen aspirin EC 81 MG tablet Take 1 tablet (81 mg total) by mouth daily. 90 tablet 3  . carvedilol (COREG) 3.125 MG tablet Take 1 tablet (3.125 mg total) by mouth 2 (two) times daily. 60 tablet 0  . cefTRIAXone (ROCEPHIN) IVPB Inject 2 g into the vein daily for 27 days. Indication:  Diabetic foot with osteomyelitis Last Day of Therapy:  10/10/18 Labs weekly on monday while on IV antibiotics:  _X_ CBC with differential X CMP X CPK Labs every other Monday while on antibiotic _X__ CRP _X_ ESR 27 Units 0  . Cholecalciferol (VITAMIN D3) 1.25 MG (50000 UT) CAPS Take 1 capsule by mouth once a week.    . clopidogrel (PLAVIX) 75 MG tablet Take 1 tablet (75 mg total) by mouth daily with breakfast. 30 tablet 0  . daptomycin (CUBICIN) IVPB Inject 750 mg into the vein daily for 27 days. Indication:  Diabetic foot with osteomyelitis Last Day of Therapy:  10/10/18 Labs weekly on monday while on IV antibiotics:  _X_ CBC with differential X CMP X CPK Labs every other Monday while on antibiotic _X__ CRP _X_ ESR 27 Units 0  . gabapentin (NEURONTIN) 100 MG capsule Take 200 mg by mouth 3 (three) times daily.     . insulin glargine (LANTUS) 100 unit/mL SOPN Inject 0.2 mLs (20 Units total) into the skin at bedtime. 6 mL 0  . metroNIDAZOLE (FLAGYL) 500 MG tablet Take 1 tablet (500 mg total) by mouth 2 (two) times daily for 28 days. 56 tablet 0  . predniSONE (DELTASONE) 5 MG tablet Take 1 tablet (5 mg total) by mouth daily with breakfast. 40 tablet 0  . torsemide (DEMADEX)  20 MG tablet Take 20-40 mg by mouth 2 (two) times daily. Take 40 mg (two tablets) in the morning and 20 mg (1 tablet) in the evening.    Marland Kitchen oxyCODONE (OXY IR/ROXICODONE) 5 MG immediate release  tablet Take 1 tablet (5 mg total) by mouth every 4 (four) hours as needed for moderate pain. (Patient not taking: Reported on 10/04/2018) 15 tablet 0   No current facility-administered medications for this visit.      Abtx:  Anti-infectives (From admission, onward)   None      REVIEW OF SYSTEMS:  Const: negative fever, significant weight loss of 50 pounds he says over the past few months  eyes: negative diplopia or visual changes, negative eye pain ENT: negative coryza, negative sore throat Resp: negative cough, hemoptysis, dyspnea Cards: negative for chest pain, palpitations, lower extremity edema GU: negative for frequency, dysuria and hematuria GI: Negative for abdominal pain, diarrhea, bleeding, constipation Skin: negative for rash and pruritus Heme: negative for easy bruising and gum/nose bleeding MS: negative for myalgias, arthralgias, back pain and muscle weakness Neurolo:negative for headaches, dizziness, vertigo, memory problems  Psych: negative for feelings of anxiety, depression  Endocrine: Has diabetes Allergy/Immunology-as above  Objective:  VITALS: BP (!) 163/88 (BP Location: Left Arm, Patient Position: Sitting, Cuff Size: Normal)   Pulse 81   Temp (!) 97.1 F (36.2 C) (Oral)   Ht 5' 10"  (1.778 m)   Wt 217 lb (98.4 kg)   BMI 31.14 kg/m   Ht 5' 10"  (1.778 m)   Wt 217 lb (98.4 kg)   BMI 31.14 kg/m  PHYSICAL EXAM:  General: Alert, cooperative, no distress, appears stated age.  Head: Normocephalic, without obvious abnormality, atraumatic. Eyes: Conjunctivae clear, anicteric sclerae. Pupils are equal ENT did not examine as he has a mask  Neck: Supple, symmetrical, no adenopathy, thyroid: non tender no carotid bruit and no JVD.   catheter on the chest site looks okay  . Lungs: Clear to auscultation bilaterally. No Wheezing or Rhonchi. No rales. Heart: Irregular  abdomen: Did not examine as he is in wheelchair Extremities: Right foot heel wound   Sutures  present has some bloody discharge  Left foot heel wound has almost healed    Neurologic: Wasting of his muscles in the hands  ? Impression/Recommendation ?54 year old male with history of diabetes mellitus, hypertension, coronary artery disease with bilateral heel ulcers  MRSA osteomyelitis of the right calcaneum.  Plan is to give him 4 weeks of IV antibiotics until 10/10/2018 with IV daptomycin and IV ceftriaxone and p.o. Flagyl.   IV antibiotics on hold since 09/29/2018  because of increasing creatinine.  The creatinine done on 10/03/2018 is 8.  So I do not think that antibiotics are not the reason for increasing creatinine. He needs to get admitted to the hospital He will need ultrasound of the kidneys he will need a Foley or a bladder scan to look for urine We will have to repeat the CMP,cpk, cbc, UA Nephrology consult  I discussed with Dr. Candiss Norse plan to send the patient to the hospital as a direct admit Inform Dr. Brett Albino. Patient can be started on daptomycin and ceftriaxone while in the hospital ? ___________________________________________________ Discussed with patient, requesting provider Note:  This document was prepared using Dragon voice recognition software and may include unintentional dictation errors.

## 2018-10-05 ENCOUNTER — Inpatient Hospital Stay: Payer: Medicare Other

## 2018-10-05 DIAGNOSIS — L97429 Non-pressure chronic ulcer of left heel and midfoot with unspecified severity: Secondary | ICD-10-CM

## 2018-10-05 DIAGNOSIS — E11621 Type 2 diabetes mellitus with foot ulcer: Secondary | ICD-10-CM

## 2018-10-05 DIAGNOSIS — I129 Hypertensive chronic kidney disease with stage 1 through stage 4 chronic kidney disease, or unspecified chronic kidney disease: Secondary | ICD-10-CM

## 2018-10-05 DIAGNOSIS — E1122 Type 2 diabetes mellitus with diabetic chronic kidney disease: Secondary | ICD-10-CM

## 2018-10-05 DIAGNOSIS — L97419 Non-pressure chronic ulcer of right heel and midfoot with unspecified severity: Secondary | ICD-10-CM

## 2018-10-05 DIAGNOSIS — Z8631 Personal history of diabetic foot ulcer: Secondary | ICD-10-CM

## 2018-10-05 DIAGNOSIS — N189 Chronic kidney disease, unspecified: Secondary | ICD-10-CM

## 2018-10-05 DIAGNOSIS — I2510 Atherosclerotic heart disease of native coronary artery without angina pectoris: Secondary | ICD-10-CM

## 2018-10-05 DIAGNOSIS — B9562 Methicillin resistant Staphylococcus aureus infection as the cause of diseases classified elsewhere: Secondary | ICD-10-CM

## 2018-10-05 DIAGNOSIS — Z792 Long term (current) use of antibiotics: Secondary | ICD-10-CM

## 2018-10-05 DIAGNOSIS — M869 Osteomyelitis, unspecified: Secondary | ICD-10-CM

## 2018-10-05 DIAGNOSIS — L89619 Pressure ulcer of right heel, unspecified stage: Secondary | ICD-10-CM

## 2018-10-05 DIAGNOSIS — B9689 Other specified bacterial agents as the cause of diseases classified elsewhere: Secondary | ICD-10-CM

## 2018-10-05 DIAGNOSIS — N179 Acute kidney failure, unspecified: Secondary | ICD-10-CM

## 2018-10-05 DIAGNOSIS — E1169 Type 2 diabetes mellitus with other specified complication: Secondary | ICD-10-CM

## 2018-10-05 LAB — GLUCOSE, CAPILLARY
Glucose-Capillary: 48 mg/dL — ABNORMAL LOW (ref 70–99)
Glucose-Capillary: 56 mg/dL — ABNORMAL LOW (ref 70–99)
Glucose-Capillary: 131 mg/dL — ABNORMAL HIGH (ref 70–99)
Glucose-Capillary: 134 mg/dL — ABNORMAL HIGH (ref 70–99)
Glucose-Capillary: 98 mg/dL (ref 70–99)
Glucose-Capillary: 144 mg/dL — ABNORMAL HIGH (ref 70–99)

## 2018-10-05 LAB — CBC
HCT: 24.6 % — ABNORMAL LOW (ref 39.0–52.0)
Hemoglobin: 7.6 g/dL — ABNORMAL LOW (ref 13.0–17.0)
MCH: 27 pg (ref 26.0–34.0)
MCHC: 30.9 g/dL (ref 30.0–36.0)
MCV: 87.5 fL (ref 80.0–100.0)
Platelets: 196 10*3/uL (ref 150–400)
RBC: 2.81 MIL/uL — ABNORMAL LOW (ref 4.22–5.81)
RDW: 17.2 % — ABNORMAL HIGH (ref 11.5–15.5)
WBC: 9.2 10*3/uL (ref 4.0–10.5)
nRBC: 0 % (ref 0.0–0.2)

## 2018-10-05 LAB — URINALYSIS, ROUTINE W REFLEX MICROSCOPIC
Bacteria, UA: NONE SEEN
Bilirubin Urine: NEGATIVE
Glucose, UA: NEGATIVE mg/dL
Ketones, ur: NEGATIVE mg/dL
Leukocytes,Ua: NEGATIVE
Nitrite: NEGATIVE
Protein, ur: 30 mg/dL — AB
Specific Gravity, Urine: 1.009 (ref 1.005–1.030)
Squamous Epithelial / HPF: NONE SEEN (ref 0–5)
pH: 5 (ref 5.0–8.0)

## 2018-10-05 LAB — BASIC METABOLIC PANEL
Anion gap: 12 (ref 5–15)
BUN: 101 mg/dL — ABNORMAL HIGH (ref 6–20)
CO2: 20 mmol/L — ABNORMAL LOW (ref 22–32)
Calcium: 7.7 mg/dL — ABNORMAL LOW (ref 8.9–10.3)
Chloride: 105 mmol/L (ref 98–111)
Creatinine, Ser: 10.77 mg/dL — ABNORMAL HIGH (ref 0.61–1.24)
GFR calc Af Amer: 6 mL/min — ABNORMAL LOW (ref >60)
GFR calc non Af Amer: 5 mL/min — ABNORMAL LOW (ref >60)
Glucose, Bld: 74 mg/dL (ref 70–99)
Potassium: 4.6 mmol/L (ref 3.5–5.1)
Sodium: 137 mmol/L (ref 135–145)

## 2018-10-05 LAB — CK: Total CK: 136 U/L (ref 49–397)

## 2018-10-05 MED ORDER — HYDROCERIN EX CREA
TOPICAL_CREAM | Freq: Every day | CUTANEOUS | Status: DC
  Administered 2018-10-05 – 2018-10-11 (×7): via TOPICAL
  Filled 2018-10-05: qty 113

## 2018-10-05 MED ORDER — SODIUM CHLORIDE 0.9 % IV SOLN
750.0000 mg | INTRAVENOUS | Status: AC
  Administered 2018-10-06 – 2018-10-10 (×3): 750 mg via INTRAVENOUS
  Filled 2018-10-05 (×3): qty 15

## 2018-10-05 MED ORDER — GABAPENTIN 100 MG PO CAPS
100.0000 mg | ORAL_CAPSULE | Freq: Three times a day (TID) | ORAL | Status: DC
  Administered 2018-10-05 – 2018-10-11 (×16): 100 mg via ORAL
  Filled 2018-10-05 (×15): qty 1

## 2018-10-05 MED ORDER — INSULIN ASPART 100 UNIT/ML ~~LOC~~ SOLN
0.0000 [IU] | Freq: Three times a day (TID) | SUBCUTANEOUS | Status: DC
  Administered 2018-10-06: 12:00:00 1 [IU] via SUBCUTANEOUS
  Administered 2018-10-06: 2 [IU] via SUBCUTANEOUS
  Administered 2018-10-08 (×2): 1 [IU] via SUBCUTANEOUS
  Administered 2018-10-08: 13:00:00 2 [IU] via SUBCUTANEOUS
  Administered 2018-10-09 – 2018-10-11 (×4): 1 [IU] via SUBCUTANEOUS
  Filled 2018-10-05 (×9): qty 1

## 2018-10-05 MED ORDER — INSULIN ASPART 100 UNIT/ML ~~LOC~~ SOLN
0.0000 [IU] | Freq: Every day | SUBCUTANEOUS | Status: DC
  Administered 2018-10-06: 22:00:00 2 [IU] via SUBCUTANEOUS
  Filled 2018-10-05: qty 1

## 2018-10-05 NOTE — Progress Notes (Signed)
Date of Admission:  10/04/2018    [name] [age]-year-old male with history of diabetes mellitus, hypertension, coronary artery disease, diabetic foot infection, rotation of all the toes in the right foot, bilateral heel ulcers which has been chronic, was admitted to the hospital on 09/05/2018 with worsening infection of the right heel ulcer.  He underwent excision of the infected necrotic Achilles tendon on the right side as well as excision of the posterior calcaneal bone.  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  He was discharged home on IV daptomycin and IV ceftriaxone and p.o. linezolid to complete 4 weeks of antibiotics on 10/10/2018.  On discharge his creatinine was 2.3 but it continued to go up during weekly labs and the daptomycin was adjusted to every 48 hours.  On 5/25 the creatinine was 4.41.  I  asked the patient to hold on all his antibiotics both daptomycin and ceftriaxone for the last 3- 4 days.  I communicated with nephrologist  and he was supposed to follow him as outpatient.  He came to my clinic on 10/04/18 for  review of the  wounds.  On examination of the wounds the right foot looks better the left foot was almost healed completely. The labs that were done on 10/03/2018  showed a creatinine more than 8.  Patient c/o  poor appetite.  He was not drinking or eating .  He had no nausea or vomiting.  He did not have any shortness of breath.  He said  his urine output was fluctuating.  He had no fever.  He lived on his own. He was admitted to the hospital directly on 10/04/18 ?    Subjective: Says he has no appetite No pain abdomen No diarrhea  Medications:  . allopurinol  100 mg Oral Daily  . amiodarone  200 mg Oral Daily  . aspirin EC  81 mg Oral Daily  . carvedilol  3.125 mg Oral BID  . cholecalciferol  5,000 Units Oral Weekly  . clopidogrel  75 mg Oral Q breakfast  . gabapentin  100 mg Oral TID  . heparin  5,000 Units Subcutaneous Q8H  . hydrocerin   Topical Daily  . insulin aspart  0-5  Units Subcutaneous QHS  . insulin aspart  0-9 Units Subcutaneous TID WC  . metroNIDAZOLE  500 mg Oral BID  . predniSONE  5 mg Oral Q breakfast    Objective: Vital signs in last 24 hours: Temp:  [97.3 F (36.3 C)-98.1 F (36.7 C)] 98.1 F (36.7 C) (06/03 1945) Pulse Rate:  [73-86] 86 (06/03 1945) Resp:  [16-18] 18 (06/03 1945) BP: (124-171)/(76-97) 171/97 (06/03 1945) SpO2:  [98 %-100 %] 100 % (06/03 1945)  PHYSICAL EXAM:  General: Alert, cooperative, no distress, appears stated age.  Head: Normocephalic, without obvious abnormality, atraumatic. Eyes: Conjunctivae clear, anicteric sclerae. Pupils are equal ENT Nares normal. No drainage or sinus tenderness. Lips, mucosa, and tongue normal. No Thrush Neck: Supple, symmetrical, no adenopathy, thyroid: non tender no carotid bruit and no JVD. Back: No CVA tenderness. Lungs: Clear to auscultation bilaterally. No Wheezing or Rhonchi. No rales. Heart: Regular rate and rhythm, no murmur, rub or gallop. Abdomen: Soft, non-tender,not distended. Bowel sounds normal. No masses Extremities: rt heel wound- with some sutures in place- some drainage  Skin: No rashes or lesions. Or bruising Lymph: Cervical, supraclavicular normal. Neurologic: b/l hands interosseous muscle wasting, clawing of fingers  Lab Results Recent Labs    10/05/18 0439  WBC 9.2  HGB 7.6*  HCT 24.6*  NA 137  K 4.6  CL 105  CO2 20*  BUN 101*  CREATININE 10.77*   Microbiology: [facility] sent on 6 Studies/Results: US Renal  Result Date: 10/05/2018 CLINICAL DATA:  Acute kidney injury. EXAM: RENAL / URINARY TRACT ULTRASOUND COMPLETE COMPARISON:  Ultrasound of July 29, 2018. FINDINGS: Right Kidney: Renal measurements: 11.5 x 6.3 x 5.4 cm = volume: 204 mL. Mildly increased echogenicity of renal parenchyma is noted. No mass or hydronephrosis visualized. Left Kidney: Renal measurements: 12.6 x 6.9 x 5.0 cm = volume: 229 mL. Mildly increased echogenicity of renal parenchyma is  noted. No mass or hydronephrosis visualized. Bladder: Appears normal for degree of bladder distention. IMPRESSION: Mildly increased echogenicity of renal parenchyma is noted bilaterally suggesting medical renal disease. No other renal abnormality is noted. Electronically Signed   By: Marijo Conception M.D.   On: 10/05/2018 12:38   Dg Foot 2 Views Right  Result Date: 10/05/2018 CLINICAL DATA:  Ulcer of the right foot. EXAM: RIGHT FOOT - 2 VIEW COMPARISON:  09/07/2018 FINDINGS: The patient appears to have undergone partial excision of the posterior calcaneus. There is overlying soft tissue swelling with evidence of an ulcer. Vascular calcifications are noted. There is no unexpected radiopaque foreign body. IMPRESSION: Interval excision of the posterior calcaneus. There is surrounding soft tissue swelling. Osteomyelitis cannot be excluded on this exam given the lack of an immediate post surgical comparison radiograph. If there is high clinical suspicion for osteomyelitis, follow-up with MRI is recommended. Electronically Signed   By: Constance Holster M.D.   On: 10/05/2018 20:18     Assessment/Plan: 53 year old male with history of diabetes mellitus, hypertension, coronary artery disease with bilateral heel ulcers  MRSA osteomyelitis of the right calcaneum.  Plan was to give him 4 weeks of IV antibiotics until 10/10/2018 with IV daptomycin and IV ceftriaxone and p.o. Flagyl. because of unexplained worsening of cr, will hold ceftriaxone and because of poor appetite will hold PO flagyl. Check CK every week  AKI on CKD-  US kidneys no hydronephrosis UA no pyuria- has afew RBC Nephrology on board and planning for dialysis  DM- management as per primary team  HTN management as per primary team  Discussed the management with patient and care team

## 2018-10-05 NOTE — Consult Note (Signed)
ORTHOPAEDIC CONSULTATION  REQUESTING PHYSICIAN: Nicholes Mango, MD  Chief Complaint: Bilateral heel ulcerations  HPI: Paul [name]. is a 53 y.o. male who is recently admitted for chronic kidney disease with worsening kidney function.  Patient has a history of bilateral heel ulcerations followed by podiatry.  Underwent debridement of the posterior right heel by Dr. Elvina Mattes several weeks ago.  He has been under his care for some time now.  Past Medical History:  Diagnosis Date  . Anemia   . Cardiac defibrillator in place    a. Biotronik LUmax 540 DRT, (ser # [id number]).  . Carotid arterial disease ([place])    a. s/p prior LICA stenting;  b. 0/0459 Carotid U/S: 40-59% bilat ICA stenosis. Patent LICA stent.  . CHF (congestive heart failure) ([place])   . CKD (chronic kidney disease), stage III ([place])   . Coronary artery disease    a. 2010 s/p CABG x 3.  . DDD (degenerative disc disease), lumbosacral    L5-S1  . Diabetes ([place])    Lantus at bedtime  . Gangrene of toe of right foot ([place])   . Gout of left hand 10/06/2016  . HFrEF (heart failure with reduced ejection fraction) ([place])    a. 07/2016 Echo: EF 25-30%, diff HK, mild MR, mildly dil LA, mod reduced RV fxn, PASP 47mHg.  .Marland KitchenHypertension    takes Coreg daily  . Ischemic cardiomyopathy    a. 07/2016 Echo: EF 25-30%, diff HK.  . [place]ocardial infarction ([place]  . Peripheral vascular disease ([place]   . Sleep apnea    sleep study yr ago-unable to afford cpap  . Stroke ([facility] 09   no weakness   Past Surgical History:  Procedure Laterality Date  . AMPUTATION TOE Right 08/22/2016   Procedure: AMPUTATION TOE;  Surgeon: JSamara Deist DPM;  Location: [facility];  Service: Podiatry;  Laterality: Right;  . AMPUTATION TOE Right 10/09/2016   Procedure: AMPUTATION TOE-RIGHT 2ND MPJ;  Surgeon: FSamara Deist DPM;  Location: [facility];  Service: Podiatry;  Laterality: Right;  . APLIGRAFT PLACEMENT Right 10/09/2016   Procedure: APLIGRAFT PLACEMENT;   Surgeon: FSamara Deist DPM;  Location: [facility];  Service: Podiatry;  Laterality: Right;  . CALCANEAL OSTEOTOMY Bilateral 07/28/2018   Procedure: RIGHT CALCANECTOMY BILATERAL DEBRIDEMENT OF ULCERS ON HEELS;  Surgeon: TAlbertine Patricia DPM;  Location: [facility];  Service: Podiatry;  Laterality: Bilateral;  . CARDIAC DEFIBRILLATOR PLACEMENT  2011  . CAROTID ENDARTERECTOMY Left   . CHOLECYSTECTOMY  2010  . CORONARY ARTERY BYPASS GRAFT  2010   CABG x 3   . GRAFT APPLICATION Right 89/77/4142  Procedure: FULL THICKNESS SKIN GRAFT-RIGHT FOOT;  Surgeon: DAlgernon Huxley MD;  Location: [facility];  Service: Vascular;  Laterality: Right;  . HIP SURGERY Right 1994  . INCISION AND DRAINAGE Right 09/08/2018   Procedure: [place]RA[place]AGE [place] DEFECT[place], SOFT TISSUE AND BONE;  Surgeon: TAlbertine Patricia DPM;  Location: [facility];  Service: Podiatry;  Laterality: Right;  . INCISION AND DRAINAGE OF WOUND Right 08/22/2016   Procedure: IRRIGATION AND DEBRIDEMENT WOUND and wound vac placement;  Surgeon: JSamara Deist DPM;  Location: [facility];  Service: Podiatry;  Laterality: Right;  . LOWER EXTREMITY ANGIOGRAPHY Right 08/24/2016   Procedure: Lower Extremity Angiography;  Surgeon: JAlgernon Huxley MD;  [facility];  Service: Cardiovascular;  Laterality: Right;  . LOWER EXTREMITY ANGIOGRAPHY Right 07/27/2018   Procedure: RIGHT Lower Extremity Angiography;  Surgeon: DAlgernon Huxley  MD;  Location: [place] [facility];  Service: Cardiovascular;  Laterality: Right;  . LOWER EXTREMITY ANGIOGRAPHY Right 09/09/2018   Procedure: Lower Extremity Angiography;  Surgeon: Katha Cabal, MD;  Location: [place] [facility];  Service: Cardiovascular;  Laterality: Right;  . MASS EXCISION Right 07/18/2014   Procedure: EXCISION HETEROTOPIC BONE RIGHT HIP;  Surgeon: Frederik Pear, MD;  Location: [place];  Service: Orthopedics;  Laterality: Right;  . Open Heart Surgery  2010   x 3  . WOUND DEBRIDEMENT Right  10/09/2016   Procedure: DEBRIDEMENT WOUND;  Surgeon: Samara Deist, DPM;  Location: [facility];  Service: Podiatry;  Laterality: Right;   Social History   Socioeconomic History  . Marital status: Legally Separated    Spouse name: Not on file  . Number of children: Not on file  . Years of education: Not on file  . Highest education level: Not on file  Occupational History  . Not on file  Social Needs  . Financial resource strain: Not on file  . Food insecurity:    Worry: Not on file    Inability: Not on file  . Transportation needs:    Medical: Not on file    Non-medical: Not on file  Tobacco Use  . Smoking status: Never Smoker  . Smokeless tobacco: Never Used  Substance and Sexual Activity  . Alcohol use: No  . Drug use: No  . Sexual activity: Not on file  Lifestyle  . Physical activity:    Days per week: Not on file    Minutes per session: Not on file  . Stress: Not on file  Relationships  . Social connections:    Talks on phone: Not on file    Gets together: Not on file    Attends religious service: Not on file    Active member of club or organization: Not on file    Attends meetings of clubs or organizations: Not on file    Relationship status: Not on file  Other Topics Concern  . Not on file  Social History Narrative  . Not on file   Family History  Problem Relation Age of Onset  . Hypertension Other   . Diabetes Other   . Diabetes Father   . Hyperlipidemia Father   . Hypertension Father   . Hyperlipidemia Sister   . Hypertension Sister   . Diabetes Sister   . Diabetes Brother   . Hypertension Brother   . Hyperlipidemia Brother    Allergies  Allergen Reactions  . Other Other (See Comments)    Cardiac Problems. Pt states he tolerates Toradol. Due to kidney and heart problems per pt  . Ibuprofen Other (See Comments)    Heart problems  . Baclofen Other (See Comments)  . Metformin Diarrhea  . Nsaids     Due to kidney and heart problems per pt    Prior to Admission medications   Medication Sig Start Date End Date Taking? Authorizing Provider  acetaminophen (TYLENOL) 325 MG tablet Take 2 tablets (650 mg total) by mouth every 6 (six) hours as needed for mild pain (or Fever >/= 101). 05/29/18   Loletha Grayer, MD  acetaminophen (TYLENOL) 500 MG tablet Take 500 mg by mouth every 6 (six) hours as needed.    [provider]  allopurinol (ZYLOPRIM) 100 MG tablet Take 1 tablet (100 mg total) by mouth daily. 08/02/18   Fritzi Mandes, MD  amiodarone (PACERONE) 200 MG tablet Take 200 mg by mouth daily. Take  200 mg twice a day for ten days, then take 200 mg once a day thereafter. 01/15/18   [provider]  aspirin EC 81 MG tablet Take 1 tablet (81 mg total) by mouth daily. 10/21/16   Theora Gianotti, NP  carvedilol (COREG) 3.125 MG tablet Take 1 tablet (3.125 mg total) by mouth 2 (two) times daily. 05/29/18   Loletha Grayer, MD  cefTRIAXone (ROCEPHIN) IVPB Inject 2 g into the vein daily for 27 days. Indication:  Diabetic foot with osteomyelitis Last Day of Therapy:  10/10/18 Labs weekly on monday while on IV antibiotics:  _X_ CBC with differential X CMP X CPK Labs every other Monday while on antibiotic _X__ CRP _X_ ESR 09/13/18 10/10/18  Mayo, Pete Pelt, MD  Cholecalciferol (VITAMIN D3) 1.25 MG (50000 UT) CAPS Take 1 capsule by mouth once a week. 07/08/18   [provider]  clopidogrel (PLAVIX) 75 MG tablet Take 1 tablet (75 mg total) by mouth daily with breakfast. 09/12/18   Mayo, Pete Pelt, MD  daptomycin (CUBICIN) IVPB Inject 750 mg into the vein daily for 27 days. Indication:  Diabetic foot with osteomyelitis Last Day of Therapy:  10/10/18 Labs weekly on monday while on IV antibiotics:  _X_ CBC with differential X CMP X CPK Labs every other Monday while on antibiotic _X__ CRP _X_ ESR 09/13/18 10/10/18  Mayo, Pete Pelt, MD  gabapentin (NEURONTIN) 100 MG capsule Take 200 mg by mouth 3 (three) times daily.   03/03/18   [provider]  insulin glargine (LANTUS) 100 unit/mL SOPN Inject 0.2 mLs (20 Units total) into the skin at bedtime. 05/29/18   Loletha Grayer, MD  metroNIDAZOLE (FLAGYL) 500 MG tablet Take 1 tablet (500 mg total) by mouth 2 (two) times daily for 28 days. 09/12/18 10/10/18  Mayo, Pete Pelt, MD  oxyCODONE (OXY IR/ROXICODONE) 5 MG immediate release tablet Take 1 tablet (5 mg total) by mouth every 4 (four) hours as needed for moderate pain. Patient not taking: Reported on 10/04/2018 09/12/18   Mayo, Pete Pelt, MD  predniSONE (DELTASONE) 5 MG tablet Take 1 tablet (5 mg total) by mouth daily with breakfast. 08/02/18   Fritzi Mandes, MD  torsemide (DEMADEX) 20 MG tablet Take 20-40 mg by mouth 2 (two) times daily. Take 40 mg (two tablets) in the morning and 20 mg (1 tablet) in the evening. 08/22/18   [provider]   US Renal  Result Date: 10/05/2018 CLINICAL DATA:  Acute kidney injury. EXAM: RENAL / URINARY TRACT ULTRASOUND COMPLETE COMPARISON:  Ultrasound of July 29, 2018. FINDINGS: Right Kidney: Renal measurements: 11.5 x 6.3 x 5.4 cm = volume: 204 mL. Mildly increased echogenicity of renal parenchyma is noted. No mass or hydronephrosis visualized. Left Kidney: Renal measurements: 12.6 x 6.9 x 5.0 cm = volume: 229 mL. Mildly increased echogenicity of renal parenchyma is noted. No mass or hydronephrosis visualized. Bladder: Appears normal for degree of bladder distention. IMPRESSION: Mildly increased echogenicity of renal parenchyma is noted bilaterally suggesting medical renal disease. No other renal abnormality is noted. Electronically Signed   By: Marijo Conception M.D.   On: 10/05/2018 12:38    Positive ROS: All other systems have been reviewed and were otherwise negative with the exception of those mentioned in the HPI and as above.  12 point ROS was performed.  Physical Exam: General: Alert and oriented.  No apparent distress.  Vascular:  Left foot:Dorsalis Pedis:   diminished Posterior Tibial:  absent  Right foot: Dorsalis Pedis:  diminished Posterior Tibial:  absent  Neuro:absent protective sensation  Derm: Left heel with small superficial granular ulceration.  Healthy in nature.  Measures approximately 2 cm in diameter.  No signs of infection.  Right posterior heel with small area of dehiscence on the posterior aspect of the heel from previous surgical intervention.  There is a linear scar along the Achilles tendon that is completely intact and healed.  There is no active purulent drainage.  Mild amount of fibrotic tissue on the posterior aspect of the heel at its surgical site.  No proximal lymphangitis no foul odor.  Ortho/MS: Bilateral lower extremity edema.  He is status post right foot transmetatarsal amputation.  Assessment: Bilateral posterior heel ulcerations that is post debridement  Plan: At this point he has dressing changes that have been ordered.  I recommended continued offloading to the posterior aspect of the heel.  Illation at this time.  No acute need for debridement of the left heel at this point as this looks to be stable.  He can follow-up with podiatry in the outpatient clinic upon discharge.  Certainly if there is worsening signs of infection on the posterior aspect of the heel will be happy to continue to follow.  For now we will follow at a distance.  I will order x-rays of his left heel just to further evaluate the posterior heel at this time.    Elesa Hacker, DPM Cell [phone]   10/05/2018 6:22 PM

## 2018-10-05 NOTE — Progress Notes (Signed)
Hypoglycemic Event  CBG: 48  Treatment: 8 oz juice/soda  Symptoms: Sweaty and Shaky  Follow-up CBG: [id number] CBG Result:56  Possible Reasons for Event: Medication regimen Comments/MD notified:Breakfast arrived and pt consumed cereal     Wilkie Aye Dionne

## 2018-10-05 NOTE — Consult Note (Signed)
[facility] Vascular Consult Note  MRN : [id number]  Paul [name]. is a 53 y.o. ([birth year]) male who presents with chief complaint of worsening kidney function.  History of Present Illness:  The patient is a 53 year old male with a past medical history of diabetes mellitus, hypertension, coronary artery disease, diabetic foot infection, chronic bilateral heel ulcers who was admitted to the hospital on 09/05/2018 with worsening infection of the right heel ulcer.    He underwent excision of the infected necrotic achilles tendon on the right side as well as excision of the posterior calcaneal bone by podiatry. PICC line was inserted by vascular surgery. 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  He was discharged home on IV daptomycin and IV ceftriaxone and p.o. linezolid to complete 4 weeks of antibiotics on 10/10/2018.    On discharge his creatinine was 2.3 but it continued to worsen weekly. On 09/26/18 his creatinine had increased to 4.41. On 10/03/18, his creatinine was 8.8 Today's creatinine / BUN: 10.77 / 101, Potassium: 4.6  Patient notes his right heel ulceration is almost completed healed. Patient is experiencing anorexia. Denies any fever, nausea or vomiting. Denies any SOB or chest pain. Still making urine.  Patient was admitted due to worsening kidney function. The nephrology service has decided to initiate dialysis at this time, and we are asked to place a Permcath.    Vascular surgery was consulted by Dr. Candiss Norse for PermCath placement Current Facility-Administered Medications  Medication Dose Route Frequency Provider Last Rate Last Dose  . 0.9 %  sodium chloride infusion   Intravenous Continuous Mayo, Pete Pelt, MD 75 mL/hr at 10/05/18 1319    . 0.9 %  sodium chloride infusion   Intravenous PRN Gouru, Aruna, MD 5 mL/hr at 10/04/18 2132 30 mL at 10/04/18 2132  . acetaminophen (TYLENOL) tablet 650 mg  650 mg Oral Q6H PRN Sela Hua, MD   650 mg at 10/04/18 2110    Or  . acetaminophen (TYLENOL) suppository 650 mg  650 mg Rectal Q6H PRN Mayo, Pete Pelt, MD      . allopurinol (ZYLOPRIM) tablet 100 mg  100 mg Oral Daily Mayo, Pete Pelt, MD   100 mg at 10/05/18 1023  . amiodarone (PACERONE) tablet 200 mg  200 mg Oral Daily Mayo, Pete Pelt, MD   200 mg at 10/05/18 1023  . aspirin EC tablet 81 mg  81 mg Oral Daily Mayo, Pete Pelt, MD   81 mg at 10/05/18 1022  . carvedilol (COREG) tablet 3.125 mg  3.125 mg Oral BID Sela Hua, MD   3.125 mg at 10/05/18 1023  . cholecalciferol (VITAMIN D) tablet 5,000 Units  5,000 Units Oral Weekly Mayo, Pete Pelt, MD      . clopidogrel (PLAVIX) tablet 75 mg  75 mg Oral Q breakfast Mayo, Pete Pelt, MD   75 mg at 10/05/18 9798  . [START ON 10/06/2018] DAPTOmycin (CUBICIN) 750 mg in sodium chloride 0.9 % IVPB  750 mg Intravenous Q48H Gouru, Aruna, MD      . gabapentin (NEURONTIN) capsule 100 mg  100 mg Oral TID Gouru, Aruna, MD      . heparin injection 5,000 Units  5,000 Units Subcutaneous Q8H Mayo, Pete Pelt, MD   5,000 Units at 10/05/18 1319  . hydrocerin (EUCERIN) cream   Topical Daily Gouru, Aruna, MD      . insulin aspart (novoLOG) injection 0-15 Units  0-15 Units Subcutaneous TID WC Mayo, Pete Pelt, MD  2 Units at 10/05/18 1320  . insulin aspart (novoLOG) injection 0-5 Units  0-5 Units Subcutaneous QHS Mayo, Pete Pelt, MD      . metroNIDAZOLE (FLAGYL) tablet 500 mg  500 mg Oral BID Sela Hua, MD   500 mg at 10/05/18 1023  . ondansetron (ZOFRAN) tablet 4 mg  4 mg Oral Q6H PRN Mayo, Pete Pelt, MD       Or  . ondansetron [facility]) injection 4 mg  4 mg Intravenous Q6H PRN Mayo, Pete Pelt, MD      . oxyCODONE (Oxy IR/ROXICODONE) immediate release tablet 5 mg  5 mg Oral Q4H PRN Mayo, Pete Pelt, MD      . polyethylene glycol (MIRALAX / GLYCOLAX) packet 17 g  17 g Oral Daily PRN Mayo, Pete Pelt, MD      . predniSONE (DELTASONE) tablet 5 mg  5 mg Oral Q breakfast Mayo, Pete Pelt, MD   5 mg at 10/05/18 4097   Past Medical  History:  Diagnosis Date  . Anemia   . Cardiac defibrillator in place    a. Biotronik LUmax 540 DRT, (ser # [id number]).  . Carotid arterial disease ([place])    a. s/p prior LICA stenting;  b. 06/6832 Carotid U/S: 40-59% bilat ICA stenosis. Patent LICA stent.  . CHF (congestive heart failure) ([place])   . CKD (chronic kidney disease), stage III ([place])   . Coronary artery disease    a. 2010 s/p CABG x 3.  . DDD (degenerative disc disease), lumbosacral    L5-S1  . Diabetes ([place])    Lantus at bedtime  . Gangrene of toe of right foot ([place])   . Gout of left hand 10/06/2016  . HFrEF (heart failure with reduced ejection fraction) ([place])    a. 07/2016 Echo: EF 25-30%, diff HK, mild MR, mildly dil LA, mod reduced RV fxn, PASP 14mmHg.  [place] Kitchen Hypertension    takes Coreg daily  . Ischemic cardiomyopathy    a. 07/2016 Echo: EF 25-30%, diff HK.  . Myocardial infarction ([place]) 2009  . Peripheral vascular disease ([place])   . Sleep apnea    sleep study yr ago-unable to afford cpap  . Stroke [facility]) 09   no weakness   Past Surgical History:  Procedure Laterality Date  . AMPUTATION TOE Right 08/22/2016   Procedure: AMPUTATION TOE;  Surgeon: Samara Deist, DPM;  Location: [facility] OR[facility];  Service: Podiatry;  Laterality: Right;  . AMPUTATION TOE Right 10/09/2016   Procedure: AMPUTATION TOE-RIGHT 2ND MPJ;  Surgeon: Samara Deist, DPM;  Location: [facility];  Service: Podiatry;  Laterality: Right;  . APLIGRAFT PLACEMENT Right 10/09/2016   Procedure: APLIGRAFT PLACEMENT;  Surgeon: Samara Deist, DPM;  Location: [facility];  Service: Podiatry;  Laterality: Right;  . CALCANEAL OSTEOTOMY Bilateral 07/28/2018   Procedure: RIGHT CALCANECTOMY BILATERAL DEBRIDEMENT OF ULCERS ON HEELS;  Surgeon: Albertine Patricia, DPM;  Location: [facility] OR[facility];  Service: Podiatry;  Laterality: Bilateral;  . CARDIAC DEFIBRILLATOR PLACEMENT  2011  . CAROTID ENDARTERECTOMY Left   . CHOLECYSTECTOMY  2010  . CORONARY ARTERY BYPASS GRAFT  2010   CABG x 3   . GRAFT  APPLICATION Right 1/96/2229   Procedure: FULL THICKNESS SKIN GRAFT-RIGHT FOOT;  Surgeon: Algernon Huxley, MD;  Location: [facility];  Service: Vascular;  Laterality: Right;  . HIP SURGERY Right 1994  . INCISION AND DRAINAGE Right 09/08/2018   Procedure: INCISION AND DRAINAGE - [place] OF DEFECTIVE SKIN, SOFT TISSUE AND BONE;  Surgeon: Albertine Patricia, DPM;  Location: [facility];  Service: Podiatry;  Laterality: Right;  . INCISION AND DRAINAGE OF WOUND Right 08/22/2016   Procedure: IRRIGATION AND DEBRIDEMENT WOUND and wound vac placement;  Surgeon: Samara Deist, DPM;  Location: [facility];  Service: Podiatry;  Laterality: Right;  . LOWER EXTREMITY ANGIOGRAPHY Right 08/24/2016   Procedure: Lower Extremity Angiography;  Surgeon: Algernon Huxley, MD;  Location: [place] [facility];  Service: Cardiovascular;  Laterality: Right;  . LOWER EXTREMITY ANGIOGRAPHY Right 07/27/2018   Procedure: RIGHT Lower Extremity Angiography;  Surgeon: Algernon Huxley, MD;  Location: [place] [facility];  Service: Cardiovascular;  Laterality: Right;  . LOWER EXTREMITY ANGIOGRAPHY Right 09/09/2018   Procedure: Lower Extremity Angiography;  Surgeon: Katha Cabal, MD;  Location: [place] [facility];  Service: Cardiovascular;  Laterality: Right;  . MASS EXCISION Right 07/18/2014   Procedure: EXCISION HETEROTOPIC BONE RIGHT HIP;  Surgeon: Frederik Pear, MD;  Location: [place];  Service: Orthopedics;  Laterality: Right;  . Open Heart Surgery  2010   x 3  . WOUND DEBRIDEMENT Right 10/09/2016   Procedure: DEBRIDEMENT WOUND;  Surgeon: Samara Deist, DPM;  Location: [facility];  Service: Podiatry;  Laterality: Right;   Social History Social History   Tobacco Use  . Smoking status: Never Smoker  . Smokeless tobacco: Never Used  Substance Use Topics  . Alcohol use: No  . Drug use: No   Family History Family History  Problem Relation Age of Onset  . Hypertension Other   . Diabetes Other   . Diabetes Father   . Hyperlipidemia Father    . Hypertension Father   . Hyperlipidemia Sister   . Hypertension Sister   . Diabetes Sister   . Diabetes Brother   . Hypertension Brother   . Hyperlipidemia Brother   Denies family history of peripheral artery disease, venous disease and/or renal disease.  Allergies  Allergen Reactions  . Other Other (See Comments)    Cardiac Problems. Pt states he tolerates Toradol. Due to kidney and heart problems per pt  . Ibuprofen Other (See Comments)    Heart problems  . Baclofen Other (See Comments)  . Metformin Diarrhea  . Nsaids     Due to kidney and heart problems per pt   REVIEW OF SYSTEMS (Negative unless checked)  Constitutional: [] Weight loss  [] Fever  [] Chills Cardiac: [] Chest pain   [] Chest pressure   [] Palpitations   [] Shortness of breath when laying flat   [] Shortness of breath at rest   [] Shortness of breath with exertion. Vascular:  [] Pain in legs with walking   [] Pain in legs at rest   [] Pain in legs when laying flat   [] Claudication   [] Pain in feet when walking  [x] Pain in feet at rest  [x] Pain in feet when laying flat   [] History of DVT   [] Phlebitis   [x] Swelling in legs   [] Varicose veins   [] Non-healing ulcers Pulmonary:   [] Uses home oxygen   [] Productive cough   [] Hemoptysis   [] Wheeze  [] COPD   [] Asthma Neurologic:  [] Dizziness  [] Blackouts   [] Seizures   [] History of stroke   [] History of TIA  [] Aphasia   [] Temporary blindness   [] Dysphagia   [] Weakness or numbness in arms   [] Weakness or numbness in legs Musculoskeletal:  [] Arthritis   [] Joint swelling   [] Joint pain   [] Low back pain Hematologic:  [] Easy bruising  [] Easy bleeding   [] Hypercoagulable state   [] Anemic  [] Hepatitis Gastrointestinal:  [] Blood in stool   []   Vomiting blood  [] Gastroesophageal reflux/heartburn   [] Difficulty swallowing. Genitourinary:  [x] Chronic kidney disease   [] Difficult urination  [] Frequent urination  [] Burning with urination   [] Blood in urine Skin:  [] Rashes   [] Ulcers    [] Wounds Psychological:  [] History of anxiety   []  History of major depression.  Physical Examination  Vitals:   10/04/18 1855 10/04/18 2019 10/05/18 0512  BP: (!) 157/87 (!) 144/82 (!) 169/93  Pulse: 86 88 83  Resp: 16 18 16   Temp: 98.8 F (37.1 C) 98.1 F (36.7 C) (!) 97.3 F (36.3 C)  SpO2: 99% 99% 98%   Gen: WD/WN Head: [place]/AT, No temporalis wasting Ear/Nose/Throat: Hearing grossly intact, nares w/o erythema or drainage Eyes: Sclera non-icteric, conjunctiva clear Neck: Supple, no nuchal rigidity.  No JVD.  Pulmonary:  Good air movement, clear to auscultation bilaterally.  Cardiac: RRR, normal S1, S2, no Murmurs, rubs or gallops. Vascular: Warm, non-tender, Minimal edema. Upper / lower extremities warm.       PICC line in right chest. No signs of infection.  Gastrointestinal: soft, non-tender/non-distended. No guarding/reflex.  Musculoskeletal: M/S 5/5 throughout. Mild edema in the lower extremities bilaterally Neurologic: Intact. No deficits. Psychiatric: Difficult to assess due to the severity of patient's illness. Dermatologic: Right foot wound almost healed. No signs of infection noted.   Lymph : No Cervical, Axillary, or Inguinal lymphadenopathy.  CBC Lab Results  Component Value Date   WBC 9.2 10/05/2018   HGB 7.6 (L) 10/05/2018   HCT 24.6 (L) 10/05/2018   MCV 87.5 10/05/2018   PLT 196 10/05/2018   BMET    Component Value Date/Time   NA 137 10/05/2018 0439   NA 139 06/23/2016 1553   K 4.6 10/05/2018 0439   CL 105 10/05/2018 0439   CO2 20 (L) 10/05/2018 0439   GLUCOSE 74 10/05/2018 0439   BUN 101 (H) 10/05/2018 0439   BUN 46 (H) 06/23/2016 1553   CREATININE 10.77 (H) 10/05/2018 0439   CALCIUM 7.7 (L) 10/05/2018 0439   GFRNONAA 5 (L) 10/05/2018 0439   GFRAA 6 (L) 10/05/2018 0439   Estimated Creatinine Clearance: 9.3 mL/min (A) (by C-G formula based on SCr of 10.77 mg/dL (H)).  COAG Lab Results  Component Value Date   INR 1.18 05/25/2018   INR 1.18  07/16/2014   Radiology Ct Ankle Right Wo Contrast  Result Date: 09/07/2018 CLINICAL DATA:  Chronic wound on the heel. EXAM: CT OF THE RIGHT ANKLE WITHOUT CONTRAST TECHNIQUE: Multidetector CT imaging of the right ankle was performed according to the standard protocol. Multiplanar CT image reconstructions were also generated. COMPARISON:  CT right foot 05/25/2018. Plain films right foot 09/05/2018. FINDINGS: Bones/Joint/Cartilage No bony destructive change or periosteal reaction is identified. No fracture or dislocation. Mid to distal transmetatarsal amputations again seen. Ligaments Suboptimally assessed by CT. Muscles and Tendons Since the prior CT, the patient has developed fluid containing a small amount of gas within and anterior to the distal Achilles tendon. Discrete measurement is difficult but fluid measures approximately 2.3 cm transverse by 1.3 cm AP by approximately 7 cm craniocaudal. The Achilles tendon appears completely torn 2 cm above its insertion on the calcaneus. Soft tissues There is subcutaneous edema about the foot. Subcutaneous edema is seen about the ankle and foot. IMPRESSION: Large skin wound over the heel. Findings highly suspicious for septic Achilles tenosynovitis and likely disruption of the Achilles tendon are identified. If the patient is able, MRI with and without contrast would be better for evaluation of these  findings. Negative for CT evidence of osteomyelitis. Status post mid to distal transmetatarsal amputations as soon on prior exams. Electronically Signed   By: Inge Rise M.D.   On: 09/07/2018 15:08   US Renal  Result Date: 10/05/2018 CLINICAL DATA:  Acute kidney injury. EXAM: RENAL / URINARY TRACT ULTRASOUND COMPLETE COMPARISON:  Ultrasound of July 29, 2018. FINDINGS: Right Kidney: Renal measurements: 11.5 x 6.3 x 5.4 cm = volume: 204 mL. Mildly increased echogenicity of renal parenchyma is noted. No mass or hydronephrosis visualized. Left Kidney: Renal  measurements: 12.6 x 6.9 x 5.0 cm = volume: 229 mL. Mildly increased echogenicity of renal parenchyma is noted. No mass or hydronephrosis visualized. Bladder: Appears normal for degree of bladder distention. IMPRESSION: Mildly increased echogenicity of renal parenchyma is noted bilaterally suggesting medical renal disease. No other renal abnormality is noted. Electronically Signed   By: Marijo Conception M.D.   On: 10/05/2018 12:38   US Carotid Bilateral  Result Date: 09/07/2018 CLINICAL DATA:  Hypertension, previous stroke, coronary artery disease, left visual disturbance, diabetes, history of left carotid stent/endarterectomy. EXAM: BILATERAL CAROTID DUPLEX ULTRASOUND TECHNIQUE: Pearline Cables scale imaging, color Doppler and duplex ultrasound were performed of bilateral carotid and vertebral arteries in the neck. COMPARISON:  None. FINDINGS: Criteria: Quantification of carotid stenosis is based on velocity parameters that correlate the residual internal carotid diameter with NASCET-based stenosis levels, using the diameter of the distal internal carotid lumen as the denominator for stenosis measurement. The following velocity measurements were obtained: RIGHT ICA: 132/55 cm/sec CCA: 17/61 cm/sec SYSTOLIC ICA/CCA RATIO:  1.4 ECA: 264 cm/sec LEFT ICA: 117/48 cm/sec CCA: 60/73 cm/sec SYSTOLIC ICA/CCA RATIO:  1.5 ECA: 153 cm/sec RIGHT CAROTID ARTERY: Mild partially calcified plaque in the mid and distal common carotid artery, bulb, proximal ECA and ICA. There is at least mild stenosis in the proximal ECA. No high-grade ICA stenosis. Normal waveforms and color Doppler signal. RIGHT VERTEBRAL ARTERY:  Normal flow direction and waveform. LEFT CAROTID ARTERY: Patent stent from the distal common carotid artery across the bulb into the proximal ICA. No high-grade stenosis. Normal waveforms and color Doppler signal. Atheromatous plaque in mid and distal ICA. Continued antegrade flow into the external carotid artery. LEFT VERTEBRAL  ARTERY:  Normal flow direction and waveform. IMPRESSION: 1. Right carotid bifurcation plaque resulting in less than 50% diameter ICA stenosis. 2. Patent left carotid stent without evidence of high-grade residual/recurrent stenosis. 3.  Antegrade bilateral vertebral arterial flow. Electronically Signed   By: Lucrezia Europe M.D.   On: 09/07/2018 08:42   Dg Foot 2 Views Right  Result Date: 09/05/2018 CLINICAL DATA:  53 y/o M; wound evaluation. 07/28/2018 calcaneal osteotomy and debridement. EXAM: RIGHT FOOT - 2 VIEW COMPARISON:  07/25/2018 right ankle radiographs. 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 Large dorsal calcaneal soft tissue post debridement, increased in size of soft tissue defect from prior CT of the ankle. Additionally, there is increased cortical lucency along the subjacent calcaneus. Vascular calcifications noted. IMPRESSION: Dorsal calcaneus soft tissue ulceration and subjacent bony cortical lucencies are increased in comparison with the prior CT of ankle which may represent progressive infection and/or interval postsurgical changes. Electronically Signed   By: Kristine Garbe M.D.   On: 09/05/2018 22:27   [place] Ekg Site Rite  Result Date: 09/09/2018 If Site Rite image not attached, placement could not be confirmed due to current cardiac rhythm.  Assessment/Plan The patient is a 53 year old male with a past medical history  of diabetes mellitus, hypertension, coronary artery disease, diabetic foot infection, chronic bilateral heel ulcers who was admitted to the hospital on 09/05/2018 with worsening infection of the right heel ulcer admitted for worsening renal function.  1.  Acute on chronic renal failure: Patient with worsening renal failure now requiring dialysis.  At this time, patient does not have adequate dialysis access to dialyze from.  The patient does have a PICC line that was inserted by our service for long-term  antibiotics in the right chest.  Plan to exchange the PICC line for PermCath on Friday with Dr. Delana Meyer to allow the patient to dialyze. Risks and benefits discussed with patient and/or family, all questions answered.  The patient wishes to proceed.  2. Osteomyelitis: The patient will be able to continue his long-term IV antibiotic through the PermCath with dialysis. 3. Diabetes: On appropriate medications. Encouraged good control as its slows the progression of atherosclerotic disease  Discussed with [place] / Mayme Genta, PA-C  10/05/2018 3:33 PM

## 2018-10-05 NOTE — Progress Notes (Signed)
[facility], [place] 10/05/18  Subjective:   Patient known to our practice from previous admission Patient was admitted in May for right heel osteomyelitis and underwent debridement and angiogram.  He was treated with IV vancomycin initially which was subsequently changed to Rocephin and daptomycin. Outpatient, his creatinine has continued to worsen Creatinine was 2.3 at the time of discharge but continued to increase.  On 5/25 noted to be 4.41.  Daptomycin and ceftriaxone was held.  Subsequently on June 1, creatinine was noted to be more than 8.  Patient was asked to come into the hospital where creatinine is now noted to be 10.7.  Patient reports poor appetite.  He states he was making himself eat because food did not taste good and he had developed an aversion to food.  He was getting maybe 1 to 2 glasses of water daily with medications.  He is noted to be on torsemide which may have contributed. Patient states he has lost more than 50 pounds over the last 6 months  Objective:  Vital signs in last 24 hours:  Temp:  [97.3 F (36.3 C)-98.8 F (37.1 C)] 97.3 F (36.3 C) (06/03 0512) Pulse Rate:  [83-88] 83 (06/03 0512) Resp:  [16-18] 16 (06/03 0512) BP: (144-169)/(82-93) 169/93 (06/03 0512) SpO2:  [98 %-99 %] 98 % (06/03 0512)  Weight change:  There were no vitals filed for this visit.  Intake/Output:    Intake/Output Summary (Last 24 hours) at 10/05/2018 1420 Last data filed at 10/05/2018 1300 Gross per 24 hour  Intake 1082.55 ml  Output 250 ml  Net 832.55 ml     Physical Exam: General:  No acute distress, lying in the bed  HEENT  moist oral mucous membranes  Neck  supple  Pulm/lungs  Normal breathing effort, clear to auscultation  CVS/Heart  regular, no rub  Abdomen:   Soft, nontender  Extremities:  Trace to 1+ edema  Neurologic:  Alert, oriented, small muscle atrophy noted in hands  Skin:  Wounds on heels bilaterally  Access:  To be placed      Basic Metabolic Panel:  Recent Labs  Lab 10/05/18 0439  NA 137  K 4.6  CL 105  CO2 20*  GLUCOSE 74  BUN 101*  CREATININE 10.77*  CALCIUM 7.7*     CBC: Recent Labs  Lab 10/05/18 0439  WBC 9.2  HGB 7.6*  HCT 24.6*  MCV 87.5  PLT 196      Lab Results  Component Value Date   HEPBSAG Negative 01/06/2017   HEPBIGM Negative 01/06/2017      Microbiology:  No results found for this or any previous visit (from the past 240 hour(s)).  Coagulation Studies: No results for input(s): LABPROT, INR in the last 72 hours.  Urinalysis: Recent Labs    10/05/18 1326  COLORURINE YELLOW*  LABSPEC 1.009  PHURINE 5.0  GLUCOSEU NEGATIVE  HGBUR SMALL*  BILIRUBINUR NEGATIVE  KETONESUR NEGATIVE  PROTEINUR 30*  NITRITE NEGATIVE  LEUKOCYTESUR NEGATIVE      Imaging: US Renal  Result Date: 10/05/2018 CLINICAL DATA:  Acute kidney injury. EXAM: RENAL / URINARY TRACT ULTRASOUND COMPLETE COMPARISON:  Ultrasound of July 29, 2018. FINDINGS: Right Kidney: Renal measurements: 11.5 x 6.3 x 5.4 cm = volume: 204 mL. Mildly increased echogenicity of renal parenchyma is noted. No mass or hydronephrosis visualized. Left Kidney: Renal measurements: 12.6 x 6.9 x 5.0 cm = volume: 229 mL. Mildly increased echogenicity of renal parenchyma is noted. No mass or  hydronephrosis visualized. Bladder: Appears normal for degree of bladder distention. IMPRESSION: Mildly increased echogenicity of renal parenchyma is noted bilaterally suggesting medical renal disease. No other renal abnormality is noted. Electronically Signed   By: Marijo Conception M.D.   On: 10/05/2018 12:38     Medications:   . sodium chloride 75 mL/hr at 10/05/18 1319  . sodium chloride 30 mL (10/04/18 2132)  . [START ON 10/06/2018] DAPTOmycin (CUBICIN)  IV     . allopurinol  100 mg Oral Daily  . amiodarone  200 mg Oral Daily  . aspirin EC  81 mg Oral Daily  . carvedilol  3.125 mg Oral BID  . cholecalciferol  5,000 Units Oral  Weekly  . clopidogrel  75 mg Oral Q breakfast  . gabapentin  100 mg Oral TID  . heparin  5,000 Units Subcutaneous Q8H  . hydrocerin   Topical Daily  . insulin aspart  0-15 Units Subcutaneous TID WC  . insulin aspart  0-5 Units Subcutaneous QHS  . metroNIDAZOLE  500 mg Oral BID  . predniSONE  5 mg Oral Q breakfast   sodium chloride, acetaminophen **OR** acetaminophen, ondansetron **OR** ondansetron (ZOFRAN) IV, oxyCODONE, polyethylene glycol  Assessment/ Plan:  53 y.o. African-American male with type 2 diabetes, diabetic neuropathy, hypertension, coronary disease, congestive heart failure with LVEF of 25 to 30% in January 1610, grade 1 diastolic dysfunction, ICD, history of CABG in 2010, carotid stenosis, stroke, obstructive sleep apnea, peripheral vascular disease, gout, osteomyelitis of heel  1.  Acute kidney injury on chronic kidney disease stage IV Renal ultrasound is negative for obstruction, shows mild increased echogenicity.  Urinalysis shows mild proteinuria.  0-5 RBCs and WBCs. Differential diagnosis is vast but most likely patient has severe ATN from drug toxicity such as vancomycin in the setting of volume depletion from torsemide. We will obtain SPEP, UPEP, urine protein to creatinine ratio.  Given on active urine sediment, Given relatively benign urinalysis, GN is less likely Patient is experiencing uremic symptoms such as loss of appetite, aversion to food, weight loss.  He has some muscle atrophy from diabetic neuropathy, therefore previous GFR may be overestimated.  Plan: Discussed options with patient.  Due to ongoing uremia, recommended getting started with hemodialysis.  Patient has a right IJ PICC line and left-sided ICD.  Will have to discuss with vascular surgery regarding access options.  WBC count is now in the normal range.  No fevers.  We should be able to place a tunneled dialysis catheter.  Plan to start hemodialysis tomorrow.  2.  Right heel osteomyelitis.  Status  post debridement on 5/7 by Dr. Elvina Mattes and angiogram on 5/8 by Dr. Delana Meyer. IV antibiotics as per ID team  3.  Diabetes type 2 with CKD Insulin-dependent Complications include diabetic foot ulcer and osteomyelitis  4.  Anemia of chronic kidney disease Lab Results  Component Value Date   HGB 7.6 (L) 10/05/2018  Will plan on starting ESA with hemodialysis    LOS: [place] 6/3/20202:20 PM  [place], [place]  Note: This note was prepared with Dragon dictation. Any transcription errors are unintentional.

## 2018-10-05 NOTE — Consult Note (Signed)
[place] Nurse wound consult note Reason for Consult: Chronic, nonhealing pressure injuries to bilateral heels, R>L. Patient is followed by Podiatry in the community, Dr. Elvina Mattes. Wound type:Pressure Pressure Injury POA: Yes Measurement: Right:  2.5cm x 4cm x 0.2cm. red wound bed with crusted periwound. Small amount of serosanguinous exudate. Foot with translateral head amputation, site well heeled. Left: 2cm x 1cm x 0.1cm red, moist with scant serosanguinous exudate Wound bed: Drainage (amount, consistency, odor) As noted above Periwound:As noted above Dressing procedure/placement/frequency: I will implement daily wound care to heels and skin care with Eucerin Cream to bilateral LEs while in house.  Wound contact layer will be xeroform gauze. Securement is with Kerlix roll gauze/paper tape. Heels are to be floated. Patient to resume follow up with Dr. Elvina Mattes and the services of his HHA upon discharge.  [place] nursing team will not follow, but will remain available to this patient, the nursing and medical teams.  Please re-consult if needed. Thanks, Maudie Flakes, MSN, RN, [place], Arther Abbott  Pager# [phone]

## 2018-10-05 NOTE — TOC Progression Note (Signed)
Transition of Care (TOC) - Progression Note    Patient Details  Name: Amond Speranza. MRN: [id number] Date of Birth: 03-31-1966  Transition of Care [facility]) CM/SW Contact  Marshell Garfinkel, RN Phone Number: 10/05/2018, 8:03 AM  Clinical Narrative:     RNCM notified Santiago Glad with [facility] [phone] of patient's presentation to hospital.        Expected Discharge Plan and Services                                                 Social Determinants of Health ([place]) Interventions    Readmission Risk Interventions Readmission Risk Prevention Plan 09/09/2018 08/02/2018  Transportation Screening Complete Complete  PCP or Specialist Appt within 3-5 Days Complete -  HRI or [place] Complete Complete  Social Work Consult for [place] Planning/Counseling Not Complete Complete  SW consult not completed comments NA -  Palliative Care Screening Not Applicable Not Applicable  Medication Review Press photographer) - Complete  Some recent data might be hidden

## 2018-10-05 NOTE — Progress Notes (Signed)
[facility] [place] NAME: Paul [name]    MR#:  [id number]  DATE OF BIRTH:  04/02/1966  SUBJECTIVE:  CHIEF COMPLAINT: Patient is doing okay worried about his renal function.  Still making urine  REVIEW OF SYSTEMS:  CONSTITUTIONAL: No fever, fatigue or weakness.  EYES: No blurred or double vision.  EARS, NOSE, AND THROAT: No tinnitus or ear pain.  RESPIRATORY: No cough, shortness of breath, wheezing or hemoptysis.  CARDIOVASCULAR: No chest pain, orthopnea, edema.  GASTROINTESTINAL: No nausea, vomiting, diarrhea or abdominal pain.  GENITOURINARY: No dysuria, hematuria.  ENDOCRINE: No polyuria, nocturia,  HEMATOLOGY: No anemia, easy bruising or bleeding SKIN: No rash or lesion. MUSCULOSKELETAL: No joint pain or arthritis.   NEUROLOGIC: No tingling, numbness, weakness.  PSYCHIATRY: No anxiety or depression.   DRUG ALLERGIES:   Allergies  Allergen Reactions  . Other Other (See Comments)    Cardiac Problems. Pt states he tolerates Toradol. Due to kidney and heart problems per pt  . Ibuprofen Other (See Comments)    Heart problems  . Baclofen Other (See Comments)  . Metformin Diarrhea  . Nsaids     Due to kidney and heart problems per pt    VITALS:  Blood pressure (!) 169/93, pulse 83, temperature (!) 97.3 F (36.3 C), resp. rate 16, SpO2 98 %.  PHYSICAL EXAMINATION:  GENERAL:  53 y.o.-year-old patient lying in the bed with no acute distress.  EYES: Pupils equal, round, reactive to light and accommodation. No scleral icterus. Extraocular muscles intact.  HEENT: Head atraumatic, normocephalic. Oropharynx and nasopharynx clear.  NECK:  Supple, no jugular venous distention. No thyroid enlargement, no tenderness.  LUNGS: Normal breath sounds bilaterally, no wheezing, rales,rhonchi or crepitation. No use of accessory muscles of respiration.  CARDIOVASCULAR: S1, S2 normal. No murmurs, rubs, or gallops.  ABDOMEN: Soft, nontender,  nondistended. Bowel sounds present.  EXTREMITIES: Right foot toes amputated, wound on the foot in clean dressing no pedal edema, cyanosis, or clubbing.  NEUROLOGIC: Cranial nerves II through XII are intact. Muscle strength 5/5 in all extremities. Sensation intact. Gait not checked.  PSYCHIATRIC: The patient is alert and oriented x 3.  SKIN: No obvious rash, lesion, or ulcer.         LABORATORY PANEL:   CBC Recent Labs  Lab 10/05/18 0439  WBC 9.2  HGB 7.6*  HCT 24.6*  PLT 196   ------------------------------------------------------------------------------------------------------------------  Chemistries  Recent Labs  Lab 10/05/18 0439  NA 137  K 4.6  CL 105  CO2 20*  GLUCOSE 74  BUN 101*  CREATININE 10.77*  CALCIUM 7.7*   ------------------------------------------------------------------------------------------------------------------  Cardiac Enzymes No results for input(s): TROPONINI in the last 168 hours. ------------------------------------------------------------------------------------------------------------------  RADIOLOGY:  US Renal  Result Date: 10/05/2018 CLINICAL DATA:  Acute kidney injury. EXAM: RENAL / URINARY TRACT ULTRASOUND COMPLETE COMPARISON:  Ultrasound of July 29, 2018. FINDINGS: Right Kidney: Renal measurements: 11.5 x 6.3 x 5.4 cm = volume: 204 mL. Mildly increased echogenicity of renal parenchyma is noted. No mass or hydronephrosis visualized. Left Kidney: Renal measurements: 12.6 x 6.9 x 5.0 cm = volume: 229 mL. Mildly increased echogenicity of renal parenchyma is noted. No mass or hydronephrosis visualized. Bladder: Appears normal for degree of bladder distention. IMPRESSION: Mildly increased echogenicity of renal parenchyma is noted bilaterally suggesting medical renal disease. No other renal abnormality is noted. Electronically Signed   By: Marijo Conception M.D.   On: 10/05/2018 12:38    EKG:  Orders placed or performed during the  hospital encounter of 05/24/18  . ED EKG  . ED EKG  . EKG 12-Lead  . EKG 12-Lead    ASSESSMENT AND PLAN:    AKI in CKD VI- likely due to ATN  -IV fluids -Renal ultrasound-increased echogenicity of the renal parenchyma indicating medical renal disease -Avoid nephrotoxic agents -Nephrology consult-consulted vascular surgery for a dialysis placement for hemodialysis in a.m. 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 Elvina Mattes as an outpatient. -Continue daptomycin, ceftriaxone, and Flagyl through 6/8 -Podiatry consult placed to Dr. Elvina Mattes and ID   HTN- BPs elevated on admission -Continuehome antihypertensives  Normocytic anemia- likely iron deficiency and anemia of chronic kidney disease- hgb at baseline. -Monitor  Type 2 diabetes -Lantus and SSI  CAD-stable, no active chest pain  -Continue aspirin and plavix  Chronic systolic CHF-stable, no signs of acute exacerbation. Recent ECHO 05/25/18 with EF 25-30%. -Holding home toresmide with AKI -Will need to watch volume status closely with IV fluids  Paroxysmal atrial fibrillation- in NSR here -Continue amiodarone -Patient previously prescribed eliquis, but never filled the prescription due to expense -Continue aspirin and Plavix -Needs anticoagulation re-addressed as an outpatient    All the records are reviewed and case discussed with Care Management/Social Workerr. Management plans discussed with the patient, he is in agreement.  CODE STATUS: Full code  TOTAL TIME TAKING CARE OF THIS PATIENT: 35 minutes.   POSSIBLE D/C IN ?DAYS, DEPENDING ON CLINICAL CONDITION.  Note: This dictation was prepared with Dragon dictation along with smaller phrase technology. Any transcriptional errors that  result from this process are unintentional.   Nicholes Mango M.D on 10/05/2018 at 4:04 PM  Between 7am to 6pm - Pager - [phone] After 6pm go to [email or web address] - password [email or web address] [facility]  Office  [phone]  CC: Primary care physician; Tracie Harrier, MD

## 2018-10-05 NOTE — Progress Notes (Signed)
PHARMACY NOTE:  ANTIMICROBIAL RENAL DOSAGE ADJUSTMENT  Current antimicrobial regimen includes a mismatch between antimicrobial dosage and estimated renal function.  As per policy approved by the Pharmacy & Therapeutics and Medical Executive Committees, the antimicrobial dosage will be adjusted accordingly.  Current antimicrobial dosage:  daptomycin 750 mg IV q24h  Indication: MRSA osteomyelitis  Renal Function: Estimated Creatinine Clearance: 9.3 mL/min (A) (by C-G formula based on SCr of 10.77 mg/dL (H)).    Antimicrobial dosage has been changed to:  daptomycin 750 mg IV q48h based on CrCl < 30 and patient was receiving this dosing regimen outpatient    Thank you for allowing pharmacy to be a part of this patient's care.  Tawnya Crook, PharmD Pharmacy Resident  10/05/2018 11:03 AM

## 2018-10-05 NOTE — Progress Notes (Signed)
Inpatient Diabetes Program Recommendations  AACE/ADA: New Consensus Statement on Inpatient Glycemic Control   Target Ranges:  Prepandial:   less than 140 mg/dL      Peak postprandial:   less than 180 mg/dL (1-2 hours)      Critically ill patients:  140 - 180 mg/dL   Results for Paul [name], Paul [name] (MRN [id number]) as of 10/05/2018 11:52  Ref. Range 09/11/2018 07:52 09/11/2018 11:29 09/11/2018 16:15 09/11/2018 20:53 09/12/2018 07:54 09/12/2018 11:43 10/04/2018 20:53  Glucose-Capillary Latest Ref Range: 70 - 99 mg/dL 108 (H) 108 (H) 194 (H) 293 (H) 131 (H) 128 (H) 187 (H)   Results for [name], [name] (MRN [id number]) as of 10/05/2018 11:52  Ref. Range 10/05/2018 07:53 10/05/2018 08:09 10/05/2018 09:28 10/05/2018 11:35  Glucose-Capillary Latest Ref Range: 70 - 99 mg/dL 48 (L) 56 (L) 131 (H) 134 (H)    Review of Glycemic Control  Diabetes history: DM2 Outpatient Diabetes medications: Lantus 20 units QHS Current orders for Inpatient glycemic control: Novolog 0-15 units TID with meals, Novolog 0-5 units QHS, Prednisone 5 mg QAM  Inpatient Diabetes Program Recommendations:   Insulin - Basal: Noted patient received Lantus 10 units on 10/04/18 at 21:29 and fasting glucose 48 mg/dl this morning. Lantus has been discontinued.  Correction (SSI): Due to renal function, please consider decreasing Novolog correction to sensitive scale.  NOTE: Noted consult for Diabetes Coordinator. Noted patient received Lantus 10 units at 21:29 on 10/04/18. Fasting glucose 48 mg/dl this morning and Lantus has been discontinued. Noted acute renal function in chronic kidney disease. Would recommend decreasing Novolog correction to sensitive scale.  Thanks, Barnie Alderman, RN, MSN, CDE Diabetes Coordinator Inpatient Diabetes Program [phone] (Team Pager from 8am to 5pm)

## 2018-10-05 NOTE — Progress Notes (Signed)
Family Meeting Note  Advance Directive:yes  Today a meeting took place with the Patient.    The following clinical team members were present during this meeting:MD  The following were discussed:Patient's diagnosis: Acute kidney injury requiring hemodialysis.  History of CHF chronic kidney disease stage III, diabetes mellitus, hypertension and other medical problems and plan of care discussed in detail with the patient.  He verbalized understanding of the plan  , Patient's progosis: Unable to determine and Goals for treatment: Full Code.  Healthcare POA mom and brother Roderic Palau  Additional follow-up to be provided: Hospitalist, nephrology, infectious disease, podiatry and vascular surgery  Time spent during discussion:17 min  Nicholes Mango, MD

## 2018-10-06 ENCOUNTER — Other Ambulatory Visit (INDEPENDENT_AMBULATORY_CARE_PROVIDER_SITE_OTHER): Payer: Self-pay | Admitting: Vascular Surgery

## 2018-10-06 LAB — GASTROINTESTINAL PANEL BY PCR, STOOL (REPLACES STOOL CULTURE)

## 2018-10-06 LAB — BASIC METABOLIC PANEL
Anion gap: 13 (ref 5–15)
BUN: 97 mg/dL — ABNORMAL HIGH (ref 6–20)
CO2: 18 mmol/L — ABNORMAL LOW (ref 22–32)
Calcium: 7.7 mg/dL — ABNORMAL LOW (ref 8.9–10.3)
Chloride: 106 mmol/L (ref 98–111)
Creatinine, Ser: 10.68 mg/dL — ABNORMAL HIGH (ref 0.61–1.24)
GFR calc Af Amer: 6 mL/min — ABNORMAL LOW (ref >60)
GFR calc non Af Amer: 5 mL/min — ABNORMAL LOW (ref >60)
Glucose, Bld: 78 mg/dL (ref 70–99)
Potassium: 5.2 mmol/L — ABNORMAL HIGH (ref 3.5–5.1)
Sodium: 137 mmol/L (ref 135–145)

## 2018-10-06 LAB — CBC
HCT: 24.7 % — ABNORMAL LOW (ref 39.0–52.0)
Hemoglobin: 7.6 g/dL — ABNORMAL LOW (ref 13.0–17.0)
MCH: 27 pg (ref 26.0–34.0)
MCHC: 30.8 g/dL (ref 30.0–36.0)
MCV: 87.6 fL (ref 80.0–100.0)
Platelets: 201 10*3/uL (ref 150–400)
RBC: 2.82 MIL/uL — ABNORMAL LOW (ref 4.22–5.81)
RDW: 17.2 % — ABNORMAL HIGH (ref 11.5–15.5)
WBC: 9.2 10*3/uL (ref 4.0–10.5)
nRBC: 0 % (ref 0.0–0.2)

## 2018-10-06 LAB — GLUCOSE, CAPILLARY
Glucose-Capillary: 74 mg/dL (ref 70–99)
Glucose-Capillary: 133 mg/dL — ABNORMAL HIGH (ref 70–99)
Glucose-Capillary: 172 mg/dL — ABNORMAL HIGH (ref 70–99)
Glucose-Capillary: 243 mg/dL — ABNORMAL HIGH (ref 70–99)

## 2018-10-06 LAB — C DIFFICILE QUICK SCREEN W PCR REFLEX
C Diff antigen: NEGATIVE
C Diff interpretation: NOT DETECTED
C Diff toxin: NEGATIVE

## 2018-10-06 LAB — CK: Total CK: 126 U/L (ref 49–397)

## 2018-10-06 MED ORDER — CLOPIDOGREL BISULFATE 75 MG PO TABS
75.0000 mg | ORAL_TABLET | Freq: Every day | ORAL | Status: DC
  Administered 2018-10-06 – 2018-10-11 (×6): 75 mg via ORAL
  Filled 2018-10-06 (×6): qty 1

## 2018-10-06 MED ORDER — HEPARIN SODIUM (PORCINE) 5000 UNIT/ML IJ SOLN
5000.0000 [IU] | Freq: Three times a day (TID) | INTRAMUSCULAR | Status: DC
  Filled 2018-10-06 (×6): qty 1

## 2018-10-06 NOTE — TOC Initial Note (Signed)
Transition of Care (TOC) - Initial/Assessment Note    Patient Details  Name: Paul [name]. MRN: [id number] Date of Birth: 25-Aug-1965  Transition of Care [facility]) CM/SW Contact:    Annamaria Boots, [place] Phone Number: 10/06/2018, 10:28 AM  Clinical Narrative:   Patient is at high risk for readmission. CSW met with patient to discuss discharge plan. Patient reports that he lives in a boarding house and has home health through [facility]. Patient was receiving IV antibiotics until recently they were stopped due to kidney function. Patient was admitted to [facility] due to decreased kidney function. Patient would like to return home when able. He would also like to continue using [facility]. CSW will continue to follow for discharge planning.                 Expected Discharge Plan: [place] Barriers to Discharge: Continued Medical Work up   Patient Goals and CMS Choice   CMS Medicare.gov Compare Post Acute Care list provided to:: Patient Choice offered to / list presented to : Patient  Expected Discharge Plan and Services Expected Discharge Plan: [facility] [facility] Choice: [place] arrangements for the past 2 months: [place]                                      Prior Living Arrangements/Services Living arrangements for the past 2 months: [place] with:: Friends Patient language and need for interpreter reviewed:: Yes Do you feel safe going back to the place where you live?: Yes      Need for Family Participation in Patient Care: No (Comment) Care giver support system in place?: No (comment)   Criminal Activity/Legal Involvement Pertinent to Current Situation/Hospitalization: No - Comment as needed  Activities of Daily Living Home Assistive Devices/Equipment: None ADL Screening (condition at time of admission) Patient's cognitive ability adequate to safely complete daily  activities?: Yes Is the patient deaf or have difficulty hearing?: No Does the patient have difficulty seeing, even when wearing glasses/contacts?: No Does the patient have difficulty concentrating, remembering, or making decisions?: No Patient able to express need for assistance with ADLs?: Yes Does the patient have difficulty dressing or bathing?: No Independently performs ADLs?: Yes (appropriate for developmental age) Does the patient have difficulty walking or climbing stairs?: Yes Weakness of Legs: Both Weakness of Arms/Hands: None  Permission Sought/Granted Permission sought to share information with : Case Manager, Customer service manager, Family Supports Permission granted to share information with : Yes, Verbal Permission Granted              Emotional Assessment Appearance:: Appears stated age   Affect (typically observed): Accepting, Calm Orientation: : Oriented to Self, Oriented to Place, Oriented to  Time, Oriented to Situation Alcohol / Substance Use: Not Applicable Psych Involvement: No (comment)  Admission diagnosis:  Acute Kidney Injury Patient Active Problem List   Diagnosis Date Noted  . AKI (acute kidney injury) ([place]) 10/04/2018  . Acute osteomyelitis of right ankle or foot ([place]) 09/06/2018  . Pressure ulcer of heel, right, unstageable ([place]) 09/06/2018  . Acute osteomyelitis of right foot ([place]) 07/25/2018  . Osteomyelitis ([place]) 07/25/2018  . Anasarca 05/25/2018  . Pressure injury of skin 05/25/2018  . CKD (chronic kidney disease), stage IV ([place]) 12/15/2016  . Ulcerated, foot ([place]) 12/15/2016  .  Gangrene of foot ([place]) 08/21/2016  . HTN (hypertension) 08/21/2016  . Diabetes ([place]) 08/21/2016  . CAD (coronary artery disease) 08/21/2016  . Chronic systolic CHF (congestive heart failure) ([place]) 08/21/2016  . Diabetic foot infection ([address] [address]) 08/21/2016  . Heterotopic ossification of bone 07/12/2014   PCP:  Tracie Harrier, MD Pharmacy:   [facility] [address] [place] [address] Phone: [phone] Fax: [phone]     Social Determinants of Health (SDOH) Interventions    Readmission Risk Interventions Readmission Risk Prevention Plan 09/09/2018 08/02/2018  Transportation Screening Complete Complete  PCP or Specialist Appt within 3-5 Days Complete -  HRI or [place] Complete Complete  Social Work Consult for [place] Planning/Counseling Not Complete Complete  SW consult not completed comments NA -  Palliative Care Screening Not Applicable Not Applicable  Medication Review Press photographer) - Complete  Some recent data might be hidden

## 2018-10-06 NOTE — Progress Notes (Signed)
[facility] [place], [place] 10/06/18  Subjective:   Patient known to our practice from previous admission Patient was admitted in May for right heel osteomyelitis and underwent debridement and angiogram.  He was treated with IV vancomycin initially which was subsequently changed to Rocephin and daptomycin. Outpatient, his creatinine has continued to worsen Creatinine was 2.3 at the time of discharge but continued to increase.  On 5/25 noted to be 4.41.  Daptomycin and ceftriaxone was held.  Subsequently on June 1, creatinine was noted to be more than 8.  Patient was asked to come into the hospital where creatinine is now noted to be 10.7.    Patient looks and feels better However no significant improvement in his lab results  Objective:  Vital signs in last 24 hours:  Temp:  [98.1 F (36.7 C)-98.3 F (36.8 C)] 98.3 F (36.8 C) (06/04 0926) Pulse Rate:  [73-86] 83 (06/04 0926) Resp:  [16-18] 16 (06/04 0926) BP: (124-171)/(76-97) 136/84 (06/04 0926) SpO2:  [99 %-100 %] 99 % (06/04 0926)  Weight change:  There were no vitals filed for this visit.  Intake/Output:    Intake/Output Summary (Last 24 hours) at 10/06/2018 1128 Last data filed at 10/06/2018 0725 Gross per 24 hour  Intake 1073.11 ml  Output 200 ml  Net 873.11 ml     Physical Exam: General:  No acute distress, lying in the bed  HEENT  moist oral mucous membranes  Neck  supple  Pulm/lungs  Normal breathing effort, clear to auscultation  CVS/Heart  regular, no rub  Abdomen:   Soft, nontender  Extremities:  Trace to 1+ edema  Neurologic:  Alert, oriented, small muscle atrophy noted in hands  Skin:  Wounds on heels bilaterally  Access:  To be placed       Basic Metabolic Panel:  Recent Labs  Lab 10/05/18 0439 10/06/18 0715  NA 137 137  K 4.6 5.2*  CL 105 106  CO2 20* 18*  GLUCOSE 74 78  BUN 101* 97*  CREATININE 10.77* 10.68*  CALCIUM 7.7* 7.7*     CBC: Recent Labs  Lab  10/05/18 0439 10/06/18 0715  WBC 9.2 9.2  HGB 7.6* 7.6*  HCT 24.6* 24.7*  MCV 87.5 87.6  PLT 196 201      Lab Results  Component Value Date   HEPBSAG Negative 01/06/2017   HEPBIGM Negative 01/06/2017      Microbiology:  Recent Results (from the past 240 hour(s))  CULTURE, BLOOD (ROUTINE X 2) w Reflex to ID Panel     Status: None (Preliminary result)   Collection Time: 10/05/18 12:59 PM  Result Value Ref Range Status   Specimen Description BLOOD BLOOD RIGHT ARM  Final   Special Requests   Final    BOTTLES DRAWN AEROBIC AND ANAEROBIC Blood Culture adequate volume   Culture   Final    NO GROWTH < 24 HOURS Performed at [facility], [address] [place], [place] [address]    Report Status PENDING  Incomplete  CULTURE, BLOOD (ROUTINE X 2) w Reflex to ID Panel     Status: None (Preliminary result)   Collection Time: 10/05/18  2:20 PM  Result Value Ref Range Status   Specimen Description BLOOD LEFT ANTECUBITAL  Final   Special Requests   Final    BOTTLES DRAWN AEROBIC AND ANAEROBIC Blood Culture adequate volume   Culture   Final    NO GROWTH < 24 HOURS Performed at [facility], [address] [place], [place] [address]    Report Status PENDING  Incomplete    Coagulation Studies: No results for input(s): LABPROT, INR in the last 72 hours.  Urinalysis: Recent Labs    10/05/18 1326  COLORURINE YELLOW*  LABSPEC 1.009  PHURINE 5.0  GLUCOSEU NEGATIVE  HGBUR SMALL*  BILIRUBINUR NEGATIVE  KETONESUR NEGATIVE  PROTEINUR 30*  NITRITE NEGATIVE  LEUKOCYTESUR NEGATIVE      Imaging: US Renal  Result Date: 10/05/2018 CLINICAL DATA:  Acute kidney injury. EXAM: RENAL / URINARY TRACT ULTRASOUND COMPLETE COMPARISON:  Ultrasound of July 29, 2018. FINDINGS: Right Kidney: Renal measurements: 11.5 x 6.3 x 5.4 cm = volume: 204 mL. Mildly increased echogenicity of renal parenchyma is noted. No mass or hydronephrosis visualized. Left Kidney: Renal  measurements: 12.6 x 6.9 x 5.0 cm = volume: 229 mL. Mildly increased echogenicity of renal parenchyma is noted. No mass or hydronephrosis visualized. Bladder: Appears normal for degree of bladder distention. IMPRESSION: Mildly increased echogenicity of renal parenchyma is noted bilaterally suggesting medical renal disease. No other renal abnormality is noted. Electronically Signed   By: Marijo Conception M.D.   On: 10/05/2018 12:38   Dg Foot 2 Views Right  Result Date: 10/05/2018 CLINICAL DATA:  Ulcer of the right foot. EXAM: RIGHT FOOT - 2 VIEW COMPARISON:  09/07/2018 FINDINGS: The patient appears to have undergone partial excision of the posterior calcaneus. There is overlying soft tissue swelling with evidence of an ulcer. Vascular calcifications are noted. There is no unexpected radiopaque foreign body. IMPRESSION: Interval excision of the posterior calcaneus. There is surrounding soft tissue swelling. Osteomyelitis cannot be excluded on this exam given the lack of an immediate post surgical comparison radiograph. If there is high clinical suspicion for osteomyelitis, follow-up with MRI is recommended. Electronically Signed   By: Constance Holster M.D.   On: 10/05/2018 20:18     Medications:   . sodium chloride 75 mL/hr at 10/06/18 0408  . sodium chloride Stopped (10/05/18 0422)  . DAPTOmycin (CUBICIN)  IV     . allopurinol  100 mg Oral Daily  . amiodarone  200 mg Oral Daily  . aspirin EC  81 mg Oral Daily  . carvedilol  3.125 mg Oral BID  . cholecalciferol  5,000 Units Oral Weekly  . clopidogrel  75 mg Oral Q breakfast  . gabapentin  100 mg Oral TID  . heparin  5,000 Units Subcutaneous Q8H  . hydrocerin   Topical Daily  . insulin aspart  0-5 Units Subcutaneous QHS  . insulin aspart  0-9 Units Subcutaneous TID WC  . metroNIDAZOLE  500 mg Oral BID  . predniSONE  5 mg Oral Q breakfast   sodium chloride, acetaminophen **OR** acetaminophen, ondansetron **OR** ondansetron (ZOFRAN) IV,  oxyCODONE, polyethylene glycol  Assessment/ Plan:  53 y.o. African-American male with type 2 diabetes, diabetic neuropathy, hypertension, coronary disease, congestive heart failure with LVEF of 25 to 30% in January 4332, grade 1 diastolic dysfunction, ICD, history of CABG in 2010, carotid stenosis, stroke, obstructive sleep apnea, peripheral vascular disease, gout, osteomyelitis of heel  1.  Acute kidney injury on chronic kidney disease stage IV Renal ultrasound is negative for obstruction, shows mild increased echogenicity.  Urinalysis shows mild proteinuria.  0-5 RBCs and WBCs. Differential diagnosis is vast but most likely patient has severe ATN from drug toxicity such as vancomycin in the setting of volume depletion from torsemide. We will obtain SPEP, UPEP, urine protein to creatinine ratio.  Given on active urine sediment,  Given relatively benign urinalysis, GN is less likely Patient is experiencing uremic symptoms such as loss of appetite, aversion to food, weight loss.  He has some muscle atrophy from diabetic neuropathy, therefore previous GFR may be overestimated.  Plan: Discussed options with patient.  Due to ongoing uremia, recommended getting started with hemodialysis.  Patient has a right IJ PICC line and left-sided ICD.  Discussed with vascular surgery regarding access options.  WBC count is now in the normal range.  No fevers.  We should be able to place a tunneled dialysis catheter.  Plan to start hemodialysis tomorrow.  2.  Right heel osteomyelitis.  Status post debridement on 5/7 by Dr. Elvina Mattes and angiogram on 5/8 by Dr. Delana Meyer. IV antibiotics as per ID team  3.  Diabetes type 2 with CKD Insulin-dependent Complications include diabetic foot ulcer and osteomyelitis Lab Results  Component Value Date   HGBA1C 7.9 (H) 05/25/2018     4.  Anemia of chronic kidney disease Lab Results  Component Value Date   HGB 7.6 (L) 10/06/2018  Will plan on starting ESA with  hemodialysis    LOS: 2 Bawi Lakins Candiss Norse 6/4/202011:28 AM  [place], [place]  Note: This note was prepared with Dragon dictation. Any transcription errors are unintentional.

## 2018-10-06 NOTE — Progress Notes (Signed)
[place] [facility] [place] NAME: Paul [name]    MR#:  [id number]  DATE OF BIRTH:  05-28-1965  SUBJECTIVE:  CHIEF COMPLAINT: Patient is doing okay Still making urine.  Reporting frequent loose bowel movements  REVIEW OF SYSTEMS:  CONSTITUTIONAL: No fever, fatigue or weakness.  EYES: No blurred or double vision.  EARS, NOSE, AND THROAT: No tinnitus or ear pain.  RESPIRATORY: No cough, shortness of breath, wheezing or hemoptysis.  CARDIOVASCULAR: No chest pain, orthopnea, edema.  GASTROINTESTINAL: No nausea, vomiting, diarrhea or abdominal pain.  GENITOURINARY: No dysuria, hematuria.  ENDOCRINE: No polyuria, nocturia,  HEMATOLOGY: No anemia, easy bruising or bleeding SKIN: No rash or lesion. MUSCULOSKELETAL: No joint pain or arthritis.   NEUROLOGIC: No tingling, numbness, weakness.  PSYCHIATRY: No anxiety or depression.   DRUG ALLERGIES:   Allergies  Allergen Reactions  . Other Other (See Comments)    Cardiac Problems. Pt states he tolerates Toradol. Due to kidney and heart problems per pt  . Ibuprofen Other (See Comments)    Heart problems  . Baclofen Other (See Comments)  . Metformin Diarrhea  . Nsaids     Due to kidney and heart problems per pt    VITALS:  Blood pressure 140/78, pulse 78, temperature 98.6 F (37 C), temperature source Oral, resp. rate 16, SpO2 99 %.  PHYSICAL EXAMINATION:  GENERAL:  53 y.o.-year-old patient lying in the bed with no acute distress.  EYES: Pupils equal, round, reactive to light and accommodation. No scleral icterus. Extraocular muscles intact.  HEENT: Head atraumatic, normocephalic. Oropharynx and nasopharynx clear.  NECK:  Supple, no jugular venous distention. No thyroid enlargement, no tenderness.  LUNGS: Normal breath sounds bilaterally, no wheezing, rales,rhonchi or crepitation. No use of accessory muscles of respiration.  CARDIOVASCULAR: S1, S2 normal. No murmurs, rubs, or gallops.   ABDOMEN: Soft, nontender, nondistended. Bowel sounds present.  EXTREMITIES: Right foot toes amputated, wound on the foot in clean dressing no pedal edema, cyanosis, or clubbing.  NEUROLOGIC: Cranial nerves II through XII are intact. Muscle strength 5/5 in all extremities. Sensation intact. Gait not checked.  PSYCHIATRIC: The patient is alert and oriented x 3.  SKIN: No obvious rash, lesion, or ulcer.         LABORATORY PANEL:   CBC Recent Labs  Lab 10/06/18 0715  WBC 9.2  HGB 7.6*  HCT 24.7*  PLT 201   ------------------------------------------------------------------------------------------------------------------  Chemistries  Recent Labs  Lab 10/06/18 0715  NA 137  K 5.2*  CL 106  CO2 18*  GLUCOSE 78  BUN 97*  CREATININE 10.68*  CALCIUM 7.7*   ------------------------------------------------------------------------------------------------------------------  Cardiac Enzymes No results for input(s): TROPONINI in the last 168 hours. ------------------------------------------------------------------------------------------------------------------  RADIOLOGY:  US Renal  Result Date: 10/05/2018 CLINICAL DATA:  Acute kidney injury. EXAM: RENAL / URINARY TRACT ULTRASOUND COMPLETE COMPARISON:  Ultrasound of July 29, 2018. FINDINGS: Right Kidney: Renal measurements: 11.5 x 6.3 x 5.4 cm = volume: 204 mL. Mildly increased echogenicity of renal parenchyma is noted. No mass or hydronephrosis visualized. Left Kidney: Renal measurements: 12.6 x 6.9 x 5.0 cm = volume: 229 mL. Mildly increased echogenicity of renal parenchyma is noted. No mass or hydronephrosis visualized. Bladder: Appears normal for degree of bladder distention. IMPRESSION: Mildly increased echogenicity of renal parenchyma is noted bilaterally suggesting medical renal disease. No other renal abnormality is noted. Electronically Signed   By: Marijo Conception M.D.   On: 10/05/2018 12:38   Dg Foot 2  Views Right   Result Date: 10/05/2018 CLINICAL DATA:  Ulcer of the right foot. EXAM: RIGHT FOOT - 2 VIEW COMPARISON:  09/07/2018 FINDINGS: The patient appears to have undergone partial excision of the posterior calcaneus. There is overlying soft tissue swelling with evidence of an ulcer. Vascular calcifications are noted. There is no unexpected radiopaque foreign body. IMPRESSION: Interval excision of the posterior calcaneus. There is surrounding soft tissue swelling. Osteomyelitis cannot be excluded on this exam given the lack of an immediate post surgical comparison radiograph. If there is high clinical suspicion for osteomyelitis, follow-up with MRI is recommended. Electronically Signed   By: Constance Holster M.D.   On: 10/05/2018 20:18    EKG:   Orders placed or performed during the hospital encounter of 05/24/18  . ED EKG  . ED EKG  . EKG 12-Lead  . EKG 12-Lead    ASSESSMENT AND PLAN:    AKI in CKD VI- likely due to ATN  -IV fluids -Renal ultrasound-increased echogenicity of the renal parenchyma indicating medical renal disease -Avoid nephrotoxic agents -Nephrology consult-consulted vascular surgery for a dialysis cath placement.  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 Elvina Mattes as an outpatient. -Continue daptomycin, and Flagyl through 6/8 -Podiatry Dr. Elvina Mattes and ID are following  Diarrhea check C. difficile toxin and GI panel   HTN- BPs elevated on admission -Continuehome antihypertensives  Normocytic anemia- likely iron deficiency and anemia of chronic kidney disease- hgb at baseline. -Monitor  Type 2  diabetes -Lantus and SSI  CAD-stable, no active chest pain  -Continue aspirin and plavix  Chronic systolic CHF-stable, no signs of acute exacerbation. Recent ECHO 05/25/18 with EF 25-30%. -Holding home toresmide with AKI -Will need to watch volume status closely with IV fluids  Paroxysmal atrial fibrillation- in NSR here -Continue amiodarone -Patient previously prescribed eliquis, but never filled the prescription due to expense -Continue aspirin and Plavix -Needs anticoagulation re-addressed as an outpatient  DVT prophylaxis with heparin subcu will hold in a.m. scheduled for permacath placement  All the records are reviewed and case discussed with Care Management/Social Workerr. Management plans discussed with the patient, he is in agreement.  CODE STATUS: Full code  TOTAL TIME TAKING CARE OF THIS PATIENT: 35 minutes.   POSSIBLE D/C IN ?DAYS, DEPENDING ON CLINICAL CONDITION.  Note: This dictation was prepared with Dragon dictation along with smaller phrase technology. Any transcriptional errors that result from this process are unintentional.   Nicholes Mango M.D on 10/06/2018 at 4:48 PM  Between 7am to 6pm - Pager - [phone] After 6pm go to [email or web address] - password [email or web address] [facility]  Office  [phone]  CC: Primary care physician; Tracie Harrier, MD

## 2018-10-06 NOTE — Progress Notes (Signed)
[facility]   Communication Note 1) Will plan on permcath insertion tomorrow (Friday) with Dr. Delana Meyer. 2) Will pre-op.  Discussed with Dr. Eber Hong Seleny Allbright PA-C 10/06/2018 11:22 AM

## 2018-10-07 ENCOUNTER — Inpatient Hospital Stay
Admission: AD | Disposition: A | Discharge status: Home Health | Payer: Self-pay | Source: Ambulatory Visit | Attending: Internal Medicine

## 2018-10-07 DIAGNOSIS — Z992 Dependence on renal dialysis: Secondary | ICD-10-CM

## 2018-10-07 DIAGNOSIS — T829XXA Unspecified complication of cardiac and vascular prosthetic device, implant and graft, initial encounter: Secondary | ICD-10-CM

## 2018-10-07 DIAGNOSIS — N186 End stage renal disease: Secondary | ICD-10-CM

## 2018-10-07 HISTORY — PX: DIALYSIS/PERMA CATHETER INSERTION: CATH118288

## 2018-10-07 LAB — BASIC METABOLIC PANEL
Anion gap: 11 (ref 5–15)
BUN: 100 mg/dL — ABNORMAL HIGH (ref 6–20)
CO2: 17 mmol/L — ABNORMAL LOW (ref 22–32)
Calcium: 7.5 mg/dL — ABNORMAL LOW (ref 8.9–10.3)
Chloride: 109 mmol/L (ref 98–111)
Creatinine, Ser: 10.43 mg/dL — ABNORMAL HIGH (ref 0.61–1.24)
GFR calc Af Amer: 6 mL/min — ABNORMAL LOW (ref >60)
GFR calc non Af Amer: 5 mL/min — ABNORMAL LOW (ref >60)
Glucose, Bld: 111 mg/dL — ABNORMAL HIGH (ref 70–99)
Potassium: 5.8 mmol/L — ABNORMAL HIGH (ref 3.5–5.1)
Sodium: 137 mmol/L (ref 135–145)

## 2018-10-07 LAB — GLUCOSE, CAPILLARY
Glucose-Capillary: 81 mg/dL (ref 70–99)
Glucose-Capillary: 82 mg/dL (ref 70–99)
Glucose-Capillary: 149 mg/dL — ABNORMAL HIGH (ref 70–99)
Glucose-Capillary: 174 mg/dL — ABNORMAL HIGH (ref 70–99)

## 2018-10-07 LAB — SURGICAL PCR SCREEN
MRSA, PCR: POSITIVE — AB
Staphylococcus aureus: POSITIVE — AB

## 2018-10-07 LAB — CBC
HCT: 23.2 % — ABNORMAL LOW (ref 39.0–52.0)
Hemoglobin: 7.1 g/dL — ABNORMAL LOW (ref 13.0–17.0)
MCH: 27.1 pg (ref 26.0–34.0)
MCHC: 30.6 g/dL (ref 30.0–36.0)
MCV: 88.5 fL (ref 80.0–100.0)
Platelets: 193 10*3/uL (ref 150–400)
RBC: 2.62 MIL/uL — ABNORMAL LOW (ref 4.22–5.81)
RDW: 17.3 % — ABNORMAL HIGH (ref 11.5–15.5)
WBC: 8 10*3/uL (ref 4.0–10.5)
nRBC: 0 % (ref 0.0–0.2)

## 2018-10-07 SURGERY — DIALYSIS/PERMA CATHETER INSERTION
Anesthesia: Choice

## 2018-10-07 MED ORDER — SODIUM BICARBONATE 8.4 % IV SOLN: 50.0000 meq | Freq: Once | INTRAVENOUS | Status: DC

## 2018-10-07 MED ORDER — FENTANYL CITRATE (PF) 100 MCG/2ML IJ SOLN
INTRAMUSCULAR | Status: AC
  Filled 2018-10-07: qty 2

## 2018-10-07 MED ORDER — FAMOTIDINE 20 MG PO TABS: 40.0000 mg | ORAL_TABLET | Freq: Once | ORAL | Status: DC | PRN

## 2018-10-07 MED ORDER — MIDAZOLAM HCL 2 MG/2ML IJ SOLN
INTRAMUSCULAR | Status: DC | PRN
  Administered 2018-10-07: 2 mg via INTRAVENOUS
  Administered 2018-10-07: 1 mg via INTRAVENOUS

## 2018-10-07 MED ORDER — CHLORHEXIDINE GLUCONATE CLOTH 2 % EX PADS
6.0000 | MEDICATED_PAD | Freq: Every day | CUTANEOUS | Status: DC
  Administered 2018-10-08 – 2018-10-11 (×4): 6 via TOPICAL

## 2018-10-07 MED ORDER — SODIUM CHLORIDE 0.9 % IV SOLN
INTRAVENOUS | Status: DC
  Administered 2018-10-07: 09:00:00 via INTRAVENOUS

## 2018-10-07 MED ORDER — EPOETIN ALFA 4000 UNIT/ML IJ SOLN
4000.0000 [IU] | INTRAMUSCULAR | Status: DC
  Administered 2018-10-10: 4000 [IU] via INTRAVENOUS
  Filled 2018-10-07: qty 1

## 2018-10-07 MED ORDER — ONDANSETRON HCL 4 MG/2ML IJ SOLN: 4.0000 mg | Freq: Four times a day (QID) | INTRAMUSCULAR | Status: DC | PRN

## 2018-10-07 MED ORDER — METHYLPREDNISOLONE SODIUM SUCC 125 MG IJ SOLR: 125.0000 mg | Freq: Once | INTRAMUSCULAR | Status: DC | PRN

## 2018-10-07 MED ORDER — CEFAZOLIN SODIUM-DEXTROSE 1-4 GM/50ML-% IV SOLN
1.0000 g | Freq: Once | INTRAVENOUS | Status: AC
  Administered 2018-10-07: 04:00:00 1 g via INTRAVENOUS
  Filled 2018-10-07 (×2): qty 50

## 2018-10-07 MED ORDER — FENTANYL CITRATE (PF) 100 MCG/2ML IJ SOLN
INTRAMUSCULAR | Status: DC | PRN
  Administered 2018-10-07 (×3): 50 ug via INTRAVENOUS

## 2018-10-07 MED ORDER — HYDROMORPHONE HCL 1 MG/ML IJ SOLN: 1.0000 mg | Freq: Once | INTRAMUSCULAR | Status: DC | PRN

## 2018-10-07 MED ORDER — MIDAZOLAM HCL 2 MG/ML PO SYRP: 8.0000 mg | ORAL_SOLUTION | Freq: Once | ORAL | Status: DC | PRN

## 2018-10-07 MED ORDER — MIDAZOLAM HCL 2 MG/2ML IJ SOLN
INTRAMUSCULAR | Status: AC
  Filled 2018-10-07: qty 4

## 2018-10-07 MED ORDER — DIPHENHYDRAMINE HCL 50 MG/ML IJ SOLN: 50.0000 mg | Freq: Once | INTRAMUSCULAR | Status: DC | PRN

## 2018-10-07 MED ORDER — SODIUM ZIRCONIUM CYCLOSILICATE 10 G PO PACK
10.0000 g | PACK | Freq: Once | ORAL | Status: DC
  Filled 2018-10-07: qty 1

## 2018-10-07 MED ORDER — MUPIROCIN 2 % EX OINT
1.0000 "application " | TOPICAL_OINTMENT | Freq: Two times a day (BID) | CUTANEOUS | Status: DC
  Administered 2018-10-07 – 2018-10-11 (×8): 1 via NASAL
  Filled 2018-10-07: qty 22

## 2018-10-07 SURGICAL SUPPLY — 12 items
CATH PALINDROME RT-P 15FX19CM (CATHETERS) ×2 IMPLANT
DERMABOND ADVANCED (GAUZE/BANDAGES/DRESSINGS) ×2
DERMABOND ADVANCED .7 DNX12 (GAUZE/BANDAGES/DRESSINGS) IMPLANT
GLIDEWIRE NITREX 0.018X80X5 (WIRE) ×2
GUIDEWIRE NITREX 0.018X80X5 (WIRE) IMPLANT
GUIDEWIRE SUPER STIFF .035X180 (WIRE) ×2 IMPLANT
NDL ENTRY 21GA 7CM ECHOTIP (NEEDLE) IMPLANT
NEEDLE ENTRY 21GA 7CM ECHOTIP (NEEDLE) ×3 IMPLANT
PACK ANGIOGRAPHY (CUSTOM PROCEDURE TRAY) ×2 IMPLANT
SET INTRO CAPELLA COAXIAL (SET/KITS/TRAYS/PACK) ×2 IMPLANT
SUT MNCRL AB 4-0 PS2 18 (SUTURE) ×2 IMPLANT
SUT SILK 0 FSL (SUTURE) ×2 IMPLANT

## 2018-10-07 NOTE — Progress Notes (Signed)
HD Tx started w/o complication    08/65/78 1530  Vital Signs  Pulse Rate 81  Pulse Rate Source Monitor  Resp 20  BP 130/78  BP Location Left Arm  BP Method Automatic  Patient Position (if appropriate) Lying  Oxygen Therapy  SpO2 100 %  O2 Device Room Air  Pulse Oximetry Type Continuous  During Hemodialysis Assessment  Blood Flow Rate (mL/min) 200 mL/min  Arterial Pressure (mmHg) -90 mmHg  Venous Pressure (mmHg) 90 mmHg  Transmembrane Pressure (mmHg) 40 mmHg  Ultrafiltration Rate (mL/min) 0 mL/min  Dialysate Flow Rate (mL/min) 300 ml/min  Conductivity: Machine  13.8  HD Safety Checks Performed Yes  Dialysis Fluid Bolus Normal Saline  Bolus Amount (mL) 250 mL  Intra-Hemodialysis Comments Tx initiated

## 2018-10-07 NOTE — Progress Notes (Signed)
Pre HD TX, pt is having 1st HD Tx, educated on purpose of dialysis and potential treatment options as outpatient.    10/07/18 1515  Vital Signs  Temp 97.6 F (36.4 C)  Temp Source Oral  Pulse Rate 78  Pulse Rate Source Monitor  Resp 18  BP 109/64  BP Location Left Arm  BP Method Automatic  Patient Position (if appropriate) Lying  Oxygen Therapy  SpO2 100 %  O2 Device Room Air  Pulse Oximetry Type Continuous  Pain Assessment  Pain Scale 0-10  Pain Score 0  Dialysis Weight  Weight 77.8 kg  Type of Weight Pre-Dialysis  Time-Out for Hemodialysis  What Procedure? HD  Pt Identifiers(min of two) First/Last Name;MRN/Account#  Correct Site? Yes  Correct Side? Yes  Correct Procedure? Yes  Consents Verified? Yes  Rad Studies Available? N/A  Safety Precautions Reviewed? Yes  Engineer, civil (consulting) Number 5  Station Number 1  UF/Alarm Test Passed  Conductivity: Meter 13.8  Conductivity: Machine  13.8  pH 7.4  Reverse Osmosis main  Normal Saline Lot Number [id number]  Dialyzer Lot Number [id number]  Machine Temperature 98.6 F (37 C)  Musician and Audible Yes  Blood Lines Intact and Secured Yes  Pre Treatment Patient Checks  Vascular access used during treatment Catheter  Hepatitis B Surface Antigen Results  (unknown)  Hepatitis B Surface Antibody  (unknown)  Date Hepatitis B Surface Antibody Drawn 10/07/18  Hemodialysis Consent Verified Yes  Hemodialysis Standing Orders Initiated Yes  ECG (Telemetry) Monitor On Yes  Prime Ordered Normal Saline  Length of  DialysisTreatment -hour(s) 2 Hour(s)  Dialysis Treatment Comments Na 140  Dialyzer Elisio 17H NR  Dialysate 3K, 2.5 Ca  Dialysis Anticoagulant None  Dialysate Flow Ordered 300  Blood Flow Rate Ordered 200 mL/min  Ultrafiltration Goal 0 Liters  Pre Treatment Labs Hepatitis B Surface Antigen  Dialysis Blood Pressure Support Ordered Normal Saline  Education / Care Plan  Dialysis Education Provided Yes   Documented Education in Care Plan Yes

## 2018-10-07 NOTE — Progress Notes (Signed)
[place] at [place] NAME: Paul [name]    MR#:  [id number]  DATE OF BIRTH:  25-Feb-1966  SUBJECTIVE:  CHIEF COMPLAINT: Patient is doing okay.  Had permacath placement today and going for dialysis  REVIEW OF SYSTEMS:  CONSTITUTIONAL: No fever, fatigue or weakness.  EYES: No blurred or double vision.  EARS, NOSE, AND THROAT: No tinnitus or ear pain.  RESPIRATORY: No cough, shortness of breath, wheezing or hemoptysis.  CARDIOVASCULAR: No chest pain, orthopnea, edema.  GASTROINTESTINAL: No nausea, vomiting, diarrhea or abdominal pain.  GENITOURINARY: No dysuria, hematuria.  ENDOCRINE: No polyuria, nocturia,  HEMATOLOGY: No anemia, easy bruising or bleeding SKIN: No rash or lesion. MUSCULOSKELETAL: No joint pain or arthritis.   NEUROLOGIC: No tingling, numbness, weakness.  PSYCHIATRY: No anxiety or depression.   DRUG ALLERGIES:   Allergies  Allergen Reactions  . Other Other (See Comments)    Cardiac Problems. Pt states he tolerates Toradol. Due to kidney and heart problems per pt  . Ibuprofen Other (See Comments)    Heart problems  . Baclofen Other (See Comments)  . Metformin Diarrhea  . Nsaids     Due to kidney and heart problems per pt    VITALS:  Blood pressure (!) 146/68, pulse 79, temperature (!) 97.3 F (36.3 C), temperature source Oral, resp. rate 18, SpO2 100 %.  PHYSICAL EXAMINATION:  GENERAL:  53 y.o.-year-old patient lying in the bed with no acute distress.  EYES: Pupils equal, round, reactive to light and accommodation. No scleral icterus. Extraocular muscles intact.  HEENT: Head atraumatic, normocephalic. Oropharynx and nasopharynx clear.  NECK:  Supple, no jugular venous distention. No thyroid enlargement, no tenderness.  LUNGS: Normal breath sounds bilaterally, no wheezing, rales,rhonchi or crepitation. No use of accessory muscles of respiration.  Anterior chest wall with permacath placement CARDIOVASCULAR:  S1, S2 normal. No murmurs, rubs, or gallops.  ABDOMEN: Soft, nontender, nondistended. Bowel sounds present.  EXTREMITIES: Right foot toes amputated, wound on the foot in clean dressing no pedal edema, cyanosis, or clubbing.  NEUROLOGIC: Cranial nerves II through XII are intact. Muscle strength 5/5 in all extremities. Sensation intact. Gait not checked.  PSYCHIATRIC: The patient is alert and oriented x 3.  SKIN: No obvious rash, lesion, or ulcer.         LABORATORY PANEL:   CBC Recent Labs  Lab 10/07/18 0347  WBC 8.0  HGB 7.1*  HCT 23.2*  PLT 193   ------------------------------------------------------------------------------------------------------------------  Chemistries  Recent Labs  Lab 10/07/18 0347  NA 137  K 5.8*  CL 109  CO2 17*  GLUCOSE 111*  BUN 100*  CREATININE 10.43*  CALCIUM 7.5*   ------------------------------------------------------------------------------------------------------------------  Cardiac Enzymes No results for input(s): TROPONINI in the last 168 hours. ------------------------------------------------------------------------------------------------------------------  RADIOLOGY:  Dg Foot 2 Views Right  Result Date: 10/05/2018 CLINICAL DATA:  Ulcer of the right foot. EXAM: RIGHT FOOT - 2 VIEW COMPARISON:  09/07/2018 FINDINGS: The patient appears to have undergone partial excision of the posterior calcaneus. There is overlying soft tissue swelling with evidence of an ulcer. Vascular calcifications are noted. There is no unexpected radiopaque foreign body. IMPRESSION: Interval excision of the posterior calcaneus. There is surrounding soft tissue swelling. Osteomyelitis cannot be excluded on this exam given the lack of an immediate post surgical comparison radiograph. If there is high clinical suspicion for osteomyelitis, follow-up with MRI is recommended. Electronically Signed   By: Constance Holster M.D.   On: 10/05/2018 20:18  EKG:    Orders placed or performed during the hospital encounter of 05/24/18  . ED EKG  . ED EKG  . EKG 12-Lead  . EKG 12-Lead    ASSESSMENT AND PLAN:    AKI in CKD VI- likely due to ATN from recent use of vancomycin -IV fluids -Renal ultrasound-increased echogenicity of the renal parenchyma indicating medical renal disease -Avoid nephrotoxic agents -Nephrology consult-consulted vascular surgery for a dialysis cath placement.  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 Elvina Mattes as an outpatient. -Continue daptomycin, and Flagyl through 6/8 -Podiatry Dr. Elvina Mattes and ID are following  Diarrhea-C. difficile toxin and GI panel are negative   HTN- BPs elevated on admission -Continuehome antihypertensives  Normocytic anemia- likely iron deficiency and anemia of chronic kidney disease- hgb at baseline. -Monitor  Type 2 diabetes -Lantus and SSI  CAD-stable, no active chest pain  -Continue aspirin and plavix  Chronic systolic CHF-stable, no signs of acute exacerbation. Recent ECHO 05/25/18 with EF 25-30%. -Holding home toresmide with AKI -Will need to watch volume status closely with IV fluids  Paroxysmal atrial fibrillation- in NSR here -Continue amiodarone -Patient previously prescribed eliquis, but never filled the prescription due to expense -Continue aspirin and Plavix -Needs anticoagulation re-addressed as an outpatient  DVT prophylaxis with heparin subcu    All the records are reviewed and case discussed with Care Management/Social Workerr. Management plans discussed with the patient, he is in agreement.  CODE STATUS: Full code   TOTAL TIME TAKING CARE OF THIS PATIENT: 35 minutes.   POSSIBLE D/C IN ?DAYS, DEPENDING ON CLINICAL CONDITION.  Note: This dictation was prepared with Dragon dictation along with smaller phrase technology. Any transcriptional errors that result from this process are unintentional.   Nicholes Mango M.D on 10/07/2018 at 4:38 PM  Between 7am to 6pm - Pager - [phone] After 6pm go to [email or web address] - password [email or web address] [facility]  Office  [phone]  CC: Primary care physician; Tracie Harrier, MD

## 2018-10-07 NOTE — Progress Notes (Signed)
HD Tx completed, tolerated well. Pt stable to return to unit.    10/07/18 1715  Vital Signs  Pulse Rate 85  Pulse Rate Source Monitor  Resp (!) 22  BP (!) 152/89  BP Location Left Arm  BP Method Automatic  Patient Position (if appropriate) Lying  Oxygen Therapy  SpO2 100 %  O2 Device Room Air  Pulse Oximetry Type Continuous  During Hemodialysis Assessment  HD Safety Checks Performed Yes  KECN 22.4 KECN  Dialysis Fluid Bolus Normal Saline  Bolus Amount (mL) 250 mL  Intra-Hemodialysis Comments Tx completed;Tolerated well

## 2018-10-07 NOTE — Progress Notes (Signed)
Per dialysis RN pt will go to dialysis around 1400.  Notified Nephrologist, Dr Candiss Norse that Sodium Bicarb and Lokelma not given in am and are due now. Per MD to d/c Sodium Bicarb, Lokelma and IVF.

## 2018-10-07 NOTE — Progress Notes (Signed)
[name] [facility], [place] 10/07/18  Subjective:   Patient known to our practice from previous admission Patient was admitted in May for right heel osteomyelitis and underwent debridement and angiogram.  He was treated with IV vancomycin initially which was subsequently changed to Rocephin and daptomycin. Outpatient, his creatinine has continued to worsen Creatinine was 2.3 at the time of discharge but continued to increase.  On 5/25 noted to be 4.41.  Daptomycin and ceftriaxone was held.  Subsequently on June 1, creatinine was noted to be more than 8.  Patient was asked to come into the [name] where creatinine is now noted to be 10.7.    Patient looks and feels better However no significant improvement in his lab results Potassium is elevated at 5.8 Underwent rt IJ permcath placement today  Objective:  Vital signs in last 24 hours:  Temp:  [97.3 F (36.3 C)-98.1 F (36.7 C)] 97.3 F (36.3 C) (06/05 1242) Pulse Rate:  [74-85] 79 (06/05 1242) Resp:  [9-22] 18 (06/05 1242) BP: (116-170)/(68-89) 146/68 (06/05 1242) SpO2:  [96 %-100 %] 100 % (06/05 1242)  Weight change:  There were no vitals filed for this visit.  Intake/Output:    Intake/Output Summary (Last 24 hours) at 10/07/2018 1535 Last data filed at 10/07/2018 1416 Gross per 24 hour  Intake 2961.69 ml  Output 375 ml  Net 2586.69 ml     Physical Exam: General:  No acute distress, lying in the bed  HEENT  moist oral mucous membranes  Neck  supple  Pulm/lungs  Normal breathing effort, clear to auscultation  CVS/Heart  regular, no rub  Abdomen:   Soft, nontender  Extremities:  Trace to 1+ edema  Neurologic:  Alert, oriented, small muscle atrophy noted in hands  Skin:  Wounds on heels bilaterally  Access:  rt IJ PC       Basic Metabolic Panel:  Recent Labs  Lab 10/05/18 0439 10/06/18 0715 10/07/18 0347  NA 137 137 137  K 4.6 5.2* 5.8*  CL 105 106 109  CO2 20* 18* 17*  GLUCOSE 74 78  111*  BUN 101* 97* 100*  CREATININE 10.77* 10.68* 10.43*  CALCIUM 7.7* 7.7* 7.5*     CBC: Recent Labs  Lab 10/05/18 0439 10/06/18 0715 10/07/18 0347  WBC 9.2 9.2 8.0  HGB 7.6* 7.6* 7.1*  HCT 24.6* 24.7* 23.2*  MCV 87.5 87.6 88.5  PLT 196 201 193      Lab Results  Component Value Date   HEPBSAG Negative 01/06/2017   HEPBIGM Negative 01/06/2017      Microbiology:  Recent Results (from the past 240 hour(s))  CULTURE, BLOOD (ROUTINE X 2) w Reflex to ID Panel     Status: None (Preliminary result)   Collection Time: 10/05/18 12:59 PM  Result Value Ref Range Status   Specimen Description BLOOD BLOOD RIGHT ARM  Final   Special Requests   Final    BOTTLES DRAWN AEROBIC AND ANAEROBIC Blood Culture adequate volume   Culture   Final    NO GROWTH 2 DAYS Performed at [facility], [address] [place], [place] [address]    Report Status PENDING  Incomplete  CULTURE, BLOOD (ROUTINE X 2) w Reflex to ID Panel     Status: None (Preliminary result)   Collection Time: 10/05/18  2:20 PM  Result Value Ref Range Status   Specimen Description BLOOD LEFT ANTECUBITAL  Final   Special Requests   Final    BOTTLES DRAWN AEROBIC  AND ANAEROBIC Blood Culture adequate volume   Culture   Final    NO GROWTH 2 DAYS Performed at [facility], [address] [place], [place] [address]    Report Status PENDING  Incomplete  C difficile quick scan w PCR reflex     Status: None   Collection Time: 10/06/18  4:51 PM  Result Value Ref Range Status   C Diff antigen NEGATIVE NEGATIVE Final   C Diff toxin NEGATIVE NEGATIVE Final   C Diff interpretation No C. difficile detected.  Final    Comment: Performed at [facility], [address] [place], [place] [address]  Gastrointestinal Panel by PCR , Stool     Status: None   Collection Time: 10/06/18  8:22 PM  Result Value Ref Range Status   Campylobacter species NOT DETECTED NOT DETECTED Final   Plesimonas  shigelloides NOT DETECTED NOT DETECTED Final   Salmonella species NOT DETECTED NOT DETECTED Final   Yersinia enterocolitica NOT DETECTED NOT DETECTED Final   Vibrio species NOT DETECTED NOT DETECTED Final   Vibrio cholerae NOT DETECTED NOT DETECTED Final   Enteroaggregative E coli (EAEC) NOT DETECTED NOT DETECTED Final   Enteropathogenic E coli (EPEC) NOT DETECTED NOT DETECTED Final   Enterotoxigenic E coli (ETEC) NOT DETECTED NOT DETECTED Final   Shiga like toxin producing E coli (STEC) NOT DETECTED NOT DETECTED Final   Shigella/Enteroinvasive E coli (EIEC) NOT DETECTED NOT DETECTED Final   Cryptosporidium NOT DETECTED NOT DETECTED Final   Cyclospora cayetanensis NOT DETECTED NOT DETECTED Final   Entamoeba histolytica NOT DETECTED NOT DETECTED Final   Giardia lamblia NOT DETECTED NOT DETECTED Final   Adenovirus F40/41 NOT DETECTED NOT DETECTED Final   Astrovirus NOT DETECTED NOT DETECTED Final   Norovirus GI/GII NOT DETECTED NOT DETECTED Final   Rotavirus A NOT DETECTED NOT DETECTED Final   Sapovirus (I, II, IV, and V) NOT DETECTED NOT DETECTED Final    Comment: Performed at [facility], [address] [place], [place] [address]  Surgical PCR screen     Status: Abnormal   Collection Time: 10/07/18  8:25 AM  Result Value Ref Range Status   MRSA, PCR POSITIVE (A) NEGATIVE Final    Comment: RESULT CALLED TO, READ BACK BY AND VERIFIED WITH:  Lucious Groves AT 2778 10/07/2018 SDR    Staphylococcus aureus POSITIVE (A) NEGATIVE Final    Comment: (NOTE) The Xpert SA Assay (FDA approved for NASAL specimens in patients 68 years of age and older), is one component of a comprehensive surveillance program. It is not intended to diagnose infection nor to guide or monitor treatment. Performed at [facility], [address] [place],  [address]     Coagulation Studies: No results for input(s): LABPROT, INR in the last 72 hours.  Urinalysis: Recent Labs     10/05/18 1326  COLORURINE YELLOW*  LABSPEC 1.009  PHURINE 5.0  GLUCOSEU NEGATIVE  HGBUR SMALL*  BILIRUBINUR NEGATIVE  KETONESUR NEGATIVE  PROTEINUR 30*  NITRITE NEGATIVE  LEUKOCYTESUR NEGATIVE      Imaging: Dg Foot 2 Views Right  Result Date: 10/05/2018 CLINICAL DATA:  Ulcer of the right foot. EXAM: RIGHT FOOT - 2 VIEW COMPARISON:  09/07/2018 FINDINGS: The patient appears to have undergone partial excision of the posterior calcaneus. There is overlying soft tissue swelling with evidence of an ulcer. Vascular calcifications are noted. There is no unexpected radiopaque foreign body. IMPRESSION: Interval excision of the posterior calcaneus. There is surrounding soft  tissue swelling. Osteomyelitis cannot be excluded on this exam given the lack of an immediate post surgical comparison radiograph. If there is high clinical suspicion for osteomyelitis, follow-up with MRI is recommended. Electronically Signed   By: Constance Holster M.D.   On: 10/05/2018 20:18     Medications:   . sodium chloride Stopped (10/05/18 0422)  . DAPTOmycin (CUBICIN)  IV Stopped (10/06/18 2206)   . allopurinol  100 mg Oral Daily  . amiodarone  200 mg Oral Daily  . aspirin EC  81 mg Oral Daily  . carvedilol  3.125 mg Oral BID  . [START ON 10/08/2018] Chlorhexidine Gluconate Cloth  6 each Topical Q0600  . cholecalciferol  5,000 Units Oral Weekly  . clopidogrel  75 mg Oral Daily  . fentaNYL      . fentaNYL      . gabapentin  100 mg Oral TID  . heparin  5,000 Units Subcutaneous Q8H  . hydrocerin   Topical Daily  . insulin aspart  0-5 Units Subcutaneous QHS  . insulin aspart  0-9 Units Subcutaneous TID WC  . metroNIDAZOLE  500 mg Oral BID  . midazolam      . mupirocin ointment  1 application Nasal BID  . predniSONE  5 mg Oral Q breakfast   sodium chloride, acetaminophen **OR** acetaminophen, HYDROmorphone (DILAUDID) injection, ondansetron **OR** ondansetron (ZOFRAN) IV, ondansetron (ZOFRAN) IV, oxyCODONE,  polyethylene glycol  Assessment/ Plan:  53 y.o. African-American male with type 2 diabetes, diabetic neuropathy, hypertension, coronary disease, congestive heart failure with LVEF of 25 to 30% in January 9449, grade 1 diastolic dysfunction, ICD, history of CABG in 2010, carotid stenosis, stroke, obstructive sleep apnea, peripheral vascular disease, gout, osteomyelitis of heel  1.  Acute kidney injury on chronic kidney disease stage IV Renal ultrasound is negative for obstruction, shows mild increased echogenicity.  Urinalysis shows mild proteinuria.  0-5 RBCs and WBCs. Differential diagnosis is vast but most likely patient has severe ATN from drug toxicity such as vancomycin in the setting of volume depletion from torsemide. We will obtain SPEP, UPEP, urine protein to creatinine ratio.  Given on active urine sediment, Given relatively benign urinalysis, GN is less likely Patient is experiencing uremic symptoms such as loss of appetite, aversion to food, weight loss.  He has some muscle atrophy from diabetic neuropathy, therefore previous GFR may be overestimated.  Plan: Initiate HD for AKI. Trial of treatments for the month of June Start outpatient d/c planning  2.  Right heel osteomyelitis.  Status post debridement on 5/7 by Dr. Elvina Mattes and angiogram on 5/8 by Dr. Delana Meyer. IV antibiotics as per ID team  3.  Diabetes type 2 with CKD Insulin-dependent Complications include diabetic foot ulcer and osteomyelitis Lab Results  Component Value Date   HGBA1C 7.9 (H) 05/25/2018     4.  Anemia of chronic kidney disease Lab Results  Component Value Date   HGB 7.1 (L) 10/07/2018  Will plan on starting ESA with hemodialysis    LOS: [place] 6/5/20203:35 PM  [place], [place]  Note: This note was prepared with Dragon dictation. Any transcription errors are unintentional.

## 2018-10-07 NOTE — Progress Notes (Signed)
Patient post perm cath placement per Dr Delana Meyer , tolerated well. Dressing to right upper chest dry and intact. Report called patient's care nurse for 108 with questions answered.

## 2018-10-07 NOTE — Op Note (Signed)
[facility]   OPERATIVE NOTE     PROCEDURE: 1. Exchange right IJ Hickman catheter for a right IJ tunneled dialysis catheter over wire same access   PRE-OPERATIVE DIAGNOSIS: Complication of dialysis device with nonfunction of tunneled catheter; end-stage renal requiring hemodialysis  POST-OPERATIVE DIAGNOSIS: same as above  SURGEON: Katha Cabal, M.D.  ANESTHESIA: Conscious sedation was administered under my direct supervision by the interventional radiology RN.  IV Versed plus fentanyl were utilized. Continuous ECG, pulse oximetry and blood pressure was monitored throughout the entire procedure.  Conscious sedation was for a total of 30 minutes.  ESTIMATED BLOOD LOSS: Minimal  FINDING(S): 1.  Tips of the catheter in the right atrium on fluoroscopy 2.  No obvious pneumothorax on fluoroscopy  SPECIMEN(S):  none  INDICATIONS:   [name]. is a 53 y.o. male  presents with end stage renal disease.  Therefore, the patient requires a tunneled dialysis catheter placement.  The patient is informed of  the risks catheter placement include but are not limited to: bleeding, infection, central venous injury, pneumothorax, possible venous stenosis, possible malpositioning in the venous system, and possible infections related to long-term catheter presence.  The patient was aware of these risks and agreed to proceed.  DESCRIPTION: The patient was taken back to Special Procedure suite.  Prior to sedation, the patient was given IV antibiotics.  After obtaining adequate sedation, the patient was prepped and draped in the standard fashion for a chest or neck tunneled dialysis catheter placement.  Appropriate Time Out is called.   The the right neck and chest wall are then infiltrated with 1% Lidocaine with epinepherine.  A 19 cm tip to cuff palindrome catheter is then selected, opened on the back table and prepped.  The cuff of the Hickman catheter  is then freed from surrounding attachments. The Hickman catheter is then controlled with a hemostat and transected above the level of the cuff.  Under fluoroscopy an Amplatz Super Stiff wire is introduced through the catheter and negotiated into the inferior vena cava. The remaining portion of the catheter is then removed without difficulty.  A dialysis catheter is then threaded over the wire and advanced under fluoroscopy so that the tip is in the mid atrium.  Each port was tested by aspirating and flushing.  No resistance was noted.  Each port was then thoroughly flushed with heparinized saline.  The catheter was secured in placed with two interrupted stitches of 0 silk tied to the catheter.   Each port was then packed with concentrated heparin (10,000 Units/mL) at the manufacturer recommended volumes to each port.  Sterile caps were applied to each port.  On completion fluoroscopy, the tips of the catheter were in the right atrium, and there was no evidence of pneumothorax.  COMPLICATIONS: None  CONDITION: [name] 10/07/2018,1:01 PM [facility] Office: [phone]   10/07/2018, 1:01 PM

## 2018-10-07 NOTE — Care Management Important Message (Signed)
Important Message  Patient Details  Name: Paul [name]. MRN: [id number] Date of Birth: 03/12/1966   Medicare Important Message Given:  Yes    Annamaria Boots, Latanya Presser 10/07/2018, 3:58 PM

## 2018-10-07 NOTE — Progress Notes (Signed)
Post HD Tx    10/07/18 1730  Hand-Off documentation  Report given to (Full Name) Lucious Groves, RN   Report received from (Full Name) Beatris Ship, RN   Vital Signs  Temp 98.1 F (36.7 C)  Temp Source Oral  Pulse Rate 84  Pulse Rate Source Monitor  Resp 14  BP (!) 148/78  BP Location Left Arm  BP Method Automatic  Patient Position (if appropriate) Lying  Oxygen Therapy  SpO2 100 %  O2 Device Room Air  Pulse Oximetry Type Continuous  Pain Assessment  Pain Scale 0-10  Pain Score 0  Dialysis Weight  Weight 77.7 kg  Type of Weight Post-Dialysis  Post-Hemodialysis Assessment  Rinseback Volume (mL) 250 mL  KECN 22.4 V  Dialyzer Clearance Lightly streaked  Duration of HD Treatment -hour(s) 2 hour(s)  Hemodialysis Intake (mL) 500 mL  UF Total -Machine (mL) 500 mL  Net UF (mL) 0 mL

## 2018-10-07 NOTE — Progress Notes (Signed)
ID Pt getting dialysis

## 2018-10-07 NOTE — Consult Note (Addendum)
Pt recently hospitalized with acute OM of Rt foot (5/4 - 09/12/2018), cultured positive for MRSA on 09/08/2018.  He has been on IV antibiotics for and followed closely by ID physician Dr Delaine Lame.  Admitted now with AKI.  Chart documention does not indicate an active infection at this time.  Conferred with Dr Delaine Lame who stated Contact Precautions were not needed.   Notified pt's nurse of this.

## 2018-10-08 LAB — BASIC METABOLIC PANEL
Anion gap: 11 (ref 5–15)
BUN: 81 mg/dL — ABNORMAL HIGH (ref 6–20)
CO2: 19 mmol/L — ABNORMAL LOW (ref 22–32)
Calcium: 7.9 mg/dL — ABNORMAL LOW (ref 8.9–10.3)
Chloride: 108 mmol/L (ref 98–111)
Creatinine, Ser: 8.67 mg/dL — ABNORMAL HIGH (ref 0.61–1.24)
GFR calc Af Amer: 7 mL/min — ABNORMAL LOW (ref >60)
GFR calc non Af Amer: 6 mL/min — ABNORMAL LOW (ref >60)
Glucose, Bld: 156 mg/dL — ABNORMAL HIGH (ref 70–99)
Potassium: 5.7 mmol/L — ABNORMAL HIGH (ref 3.5–5.1)
Sodium: 138 mmol/L (ref 135–145)

## 2018-10-08 LAB — CBC
HCT: 27.9 % — ABNORMAL LOW (ref 39.0–52.0)
Hemoglobin: 8.4 g/dL — ABNORMAL LOW (ref 13.0–17.0)
MCH: 26.4 pg (ref 26.0–34.0)
MCHC: 30.1 g/dL (ref 30.0–36.0)
MCV: 87.7 fL (ref 80.0–100.0)
Platelets: 217 10*3/uL (ref 150–400)
RBC: 3.18 MIL/uL — ABNORMAL LOW (ref 4.22–5.81)
RDW: 17.5 % — ABNORMAL HIGH (ref 11.5–15.5)
WBC: 8.7 10*3/uL (ref 4.0–10.5)
nRBC: 0 % (ref 0.0–0.2)

## 2018-10-08 LAB — GLUCOSE, CAPILLARY
Glucose-Capillary: 132 mg/dL — ABNORMAL HIGH (ref 70–99)
Glucose-Capillary: 172 mg/dL — ABNORMAL HIGH (ref 70–99)
Glucose-Capillary: 143 mg/dL — ABNORMAL HIGH (ref 70–99)
Glucose-Capillary: 170 mg/dL — ABNORMAL HIGH (ref 70–99)

## 2018-10-08 LAB — HEPATITIS B SURFACE ANTIBODY,QUALITATIVE: Hep B S Ab: NONREACTIVE

## 2018-10-08 LAB — HEPATITIS B SURFACE ANTIGEN: Hepatitis B Surface Ag: NEGATIVE

## 2018-10-08 LAB — HEPATITIS C ANTIBODY: HCV Ab: <0.1 s/co ratio (ref 0.0–0.9)

## 2018-10-08 LAB — HEPATITIS B CORE ANTIBODY, TOTAL: Hep B Core Total Ab: NEGATIVE

## 2018-10-08 MED ORDER — LOPERAMIDE HCL 2 MG PO CAPS
2.0000 mg | ORAL_CAPSULE | Freq: Three times a day (TID) | ORAL | Status: DC | PRN
  Administered 2018-10-09 – 2018-10-10 (×3): 2 mg via ORAL
  Filled 2018-10-08 (×3): qty 1

## 2018-10-08 NOTE — Progress Notes (Signed)
Patient ID: Paul [facility]., male   DOB: 1965-08-25, 53 y.o.   MRN: [id number]  [facility] PROGRESS NOTE  Paul [facility]. IRW:[id number] DOB: 1965/11/21 DOA: 10/04/2018 PCP: Tracie Harrier, MD  HPI/Subjective: Patient feels okay.  Asking how long he needs to be in the hospital for.  Objective: Vitals:   10/08/18 0441 10/08/18 1151  BP: (!) 149/84 (!) 156/88  Pulse: 88 79  Resp:  18  Temp: 98.3 F (36.8 C) 98.3 F (36.8 C)  SpO2: 97% 100%    Filed Weights   10/07/18 1515 10/07/18 1730 10/07/18 1802  Weight: 77.8 kg 77.7 kg 104.3 kg    ROS: Review of Systems  Constitutional: Negative for chills and fever.  Eyes: Negative for blurred vision.  Respiratory: Negative for cough and shortness of breath.   Cardiovascular: Negative for chest pain.  Gastrointestinal: Negative for abdominal pain, constipation, diarrhea, nausea and vomiting.  Genitourinary: Negative for dysuria.  Musculoskeletal: Negative for joint pain.  Neurological: Negative for dizziness and headaches.   Exam: Physical Exam  Constitutional: He is oriented to person, place, and time.  HENT:  Nose: No mucosal edema.  Mouth/Throat: No oropharyngeal exudate or posterior oropharyngeal edema.  Eyes: Pupils are equal, round, and reactive to light. Conjunctivae, EOM and lids are normal.  Neck: No JVD present. Carotid bruit is not present. No edema present. No thyroid mass and no thyromegaly present.  Cardiovascular: S1 normal and S2 normal. Exam reveals no gallop.  No murmur heard. Pulses:      Dorsalis pedis pulses are 2+ on the right side and 2+ on the left side.  Respiratory: No respiratory distress. He has no wheezes. He has no rhonchi. He has no rales.  GI: Soft. Bowel sounds are normal. There is no abdominal tenderness.  Musculoskeletal:     Right ankle: He exhibits swelling.     Left ankle: He exhibits swelling.  Lymphadenopathy:    He has no cervical adenopathy.  Neurological: He is alert and  oriented to person, place, and time. No cranial nerve deficit.  Skin: Skin is warm. Nails show no clubbing.  Ankles covered  Psychiatric: He has a normal mood and affect.      Data Reviewed: Basic Metabolic Panel: Recent Labs  Lab 10/05/18 0439 10/06/18 0715 10/07/18 0347 10/08/18 0557  NA 137 137 137 138  K 4.6 5.2* 5.8* 5.7*  CL 105 106 109 108  CO2 20* 18* 17* 19*  GLUCOSE 74 78 111* 156*  BUN 101* 97* 100* 81*  CREATININE 10.77* 10.68* 10.43* 8.67*  CALCIUM 7.7* 7.7* 7.5* 7.9*   CBC: Recent Labs  Lab 10/05/18 0439 10/06/18 0715 10/07/18 0347 10/08/18 0557  WBC 9.2 9.2 8.0 8.7  HGB 7.6* 7.6* 7.1* 8.4*  HCT 24.6* 24.7* 23.2* 27.9*  MCV 87.5 87.6 88.5 87.7  PLT 196 201 193 217   Cardiac Enzymes: Recent Labs  Lab 10/05/18 0439 10/06/18 0715  CKTOTAL 136 126   BNP (last 3 results) Recent Labs    05/25/18 0038  BNP 129.0*    CBG: Recent Labs  Lab 10/07/18 0914 10/07/18 1756 10/07/18 2123 10/08/18 0755 10/08/18 1200  GLUCAP 82 149* 174* 132* 172*    Recent Results (from the past 240 hour(s))  CULTURE, BLOOD (ROUTINE X 2) w Reflex to ID Panel     Status: None (Preliminary result)   Collection Time: 10/05/18 12:59 PM  Result Value Ref Range Status   Specimen Description BLOOD BLOOD RIGHT ARM  Final  Special Requests   Final    BOTTLES DRAWN AEROBIC AND ANAEROBIC Blood Culture adequate volume   Culture   Final    NO GROWTH 3 DAYS Performed at [facility], [address] [place], [place] [address]    Report Status PENDING  Incomplete  CULTURE, BLOOD (ROUTINE X 2) w Reflex to ID Panel     Status: None (Preliminary result)   Collection Time: 10/05/18  2:20 PM  Result Value Ref Range Status   Specimen Description BLOOD LEFT ANTECUBITAL  Final   Special Requests   Final    BOTTLES DRAWN AEROBIC AND ANAEROBIC Blood Culture adequate volume   Culture   Final    NO GROWTH 3 DAYS Performed at [facility], [address]  [place], [place] [address]    Report Status PENDING  Incomplete  C difficile quick scan w PCR reflex     Status: None   Collection Time: 10/06/18  4:51 PM  Result Value Ref Range Status   C Diff antigen NEGATIVE NEGATIVE Final   C Diff toxin NEGATIVE NEGATIVE Final   C Diff interpretation No C. difficile detected.  Final    Comment: Performed at [facility], [address] [place], [place] [address]  Gastrointestinal Panel by PCR , Stool     Status: None   Collection Time: 10/06/18  8:22 PM  Result Value Ref Range Status   Campylobacter species NOT DETECTED NOT DETECTED Final   Plesimonas shigelloides NOT DETECTED NOT DETECTED Final   Salmonella species NOT DETECTED NOT DETECTED Final   Yersinia enterocolitica NOT DETECTED NOT DETECTED Final   Vibrio species NOT DETECTED NOT DETECTED Final   Vibrio cholerae NOT DETECTED NOT DETECTED Final   Enteroaggregative E coli (EAEC) NOT DETECTED NOT DETECTED Final   Enteropathogenic E coli (EPEC) NOT DETECTED NOT DETECTED Final   Enterotoxigenic E coli (ETEC) NOT DETECTED NOT DETECTED Final   Shiga like toxin producing E coli (STEC) NOT DETECTED NOT DETECTED Final   Shigella/Enteroinvasive E coli (EIEC) NOT DETECTED NOT DETECTED Final   Cryptosporidium NOT DETECTED NOT DETECTED Final   Cyclospora cayetanensis NOT DETECTED NOT DETECTED Final   Entamoeba histolytica NOT DETECTED NOT DETECTED Final   Giardia lamblia NOT DETECTED NOT DETECTED Final   Adenovirus F40/41 NOT DETECTED NOT DETECTED Final   Astrovirus NOT DETECTED NOT DETECTED Final   Norovirus GI/GII NOT DETECTED NOT DETECTED Final   Rotavirus A NOT DETECTED NOT DETECTED Final   Sapovirus (I, II, IV, and V) NOT DETECTED NOT DETECTED Final    Comment: Performed at [facility], [address] [place], [place] [address]  Surgical PCR screen     Status: Abnormal   Collection Time: 10/07/18  8:25 AM  Result Value Ref Range Status   MRSA, PCR POSITIVE (A) NEGATIVE Final     Comment: RESULT CALLED TO, READ BACK BY AND VERIFIED WITH:  Lucious Groves AT 2956 10/07/2018 SDR    Staphylococcus aureus POSITIVE (A) NEGATIVE Final    Comment: (NOTE) The Xpert SA Assay (FDA approved for NASAL specimens in patients 43 years of age and older), is one component of a comprehensive surveillance program. It is not intended to diagnose infection nor to guide or monitor treatment. Performed at [facility], [address] [place], [place] [address]       Scheduled Meds: . allopurinol  100 mg Oral Daily  . amiodarone  200 mg Oral Daily  . aspirin EC  81  mg Oral Daily  . carvedilol  3.125 mg Oral BID  . Chlorhexidine Gluconate Cloth  6 each Topical Q0600  . cholecalciferol  5,000 Units Oral Weekly  . clopidogrel  75 mg Oral Daily  . [START ON 10/10/2018] epoetin (EPOGEN/PROCRIT) injection  4,000 Units Intravenous Q M,W,F-HD  . gabapentin  100 mg Oral TID  . heparin  5,000 Units Subcutaneous Q8H  . hydrocerin   Topical Daily  . insulin aspart  0-5 Units Subcutaneous QHS  . insulin aspart  0-9 Units Subcutaneous TID WC  . metroNIDAZOLE  500 mg Oral BID  . mupirocin ointment  1 application Nasal BID  . predniSONE  5 mg Oral Q breakfast   Continuous Infusions: . sodium chloride Stopped (10/05/18 0422)  . DAPTOmycin (CUBICIN)  IV Stopped (10/06/18 2206)    Assessment/Plan:  1. Acute kidney injury likely progressed to end-stage renal disease.  PermCath placed and dialysis started yesterday.  Will need another dialysis session today for hyperkalemia. 2. Hyperkalemia.  Dialysis today. 3. Osteomyelitis right calcaneus.  Patient underwent surgery for necrotic Achilles tendon on 09/08/2018.  Patient on daptomycin through 10/10/2018. 4. Chronic systolic heart failure with anasarca.  Dialysis should help out with managing fluid.  On low-dose Coreg. 5. Chronic atrial fibrillation.  Taken off anticoagulation.  On aspirin and Plavix at this point.  On amiodarone. 6. Type  2 diabetes mellitus on sliding scale 7. Diabetic neuropathy on low-dose gabapentin 8. Chronic gout on low-dose prednisone and low-dose allopurinol  Code Status:     Code Status Orders  (From admission, onward)         Start     Ordered   10/04/18 1959  Full code  Continuous     10/04/18 1958        Code Status History    Date Active Date Inactive Code Status Order ID Comments User Context   09/06/2018 0259 09/12/2018 2017 Full Code [id number]  Lance Coon, MD Inpatient   07/25/2018 1717 08/02/2018 1919 Full Code [id number]  Dustin Flock, MD Inpatient   05/25/2018 0352 05/29/2018 2022 Full Code [id number]  Hillary Bow, MD ED   10/09/2016 1003 10/09/2016 1340 Full Code [id number]  Samara Deist, [place] Inpatient   08/22/2016 0042 08/27/2016 1649 Full Code [id number]  Lance Coon, MD Inpatient   07/18/2014 1818 07/20/2014 2002 Full Code [id number]  Leighton Parody, PA-C Inpatient      Disposition Plan: To be determined  Consultants:  Nephrology  Vascular surgery  Podiatry  Antibiotics:  Daptomycin  Flagyl  Time spent: 28 minutes  [place]

## 2018-10-08 NOTE — Progress Notes (Signed)
Hd post tx. Pt tolerated tx without complications. Dressing changed   10/08/18 2123  Vital Signs  Pulse Rate 82  Pulse Rate Source Monitor  Resp 20  BP 133/73  BP Location Left Arm  BP Method Automatic  Patient Position (if appropriate) Lying  Oxygen Therapy  SpO2 100 %  O2 Device Room Air  Pain Assessment  Pain Scale 0-10  Pain Score Asleep  Dialysis Weight  Weight 105.9 kg  Type of Weight Post-Dialysis  Post-Hemodialysis Assessment  Rinseback Volume (mL) 250 mL  KECN 34.5 V  Dialyzer Clearance Lightly streaked  Duration of HD Treatment -hour(s) 2.5 hour(s)  Hemodialysis Intake (mL) 500 mL  UF Total -Machine (mL) 500 mL  Net UF (mL) 0 mL  Tolerated HD Treatment Yes  Post-Hemodialysis Comments tolerated tx

## 2018-10-08 NOTE — Progress Notes (Signed)
Pre-hd   10/08/18 1830  Hand-Off documentation  Report received from (Full Name) Malka Sinwany   Vital Signs  Temp 98 F (36.7 C)  Temp Source Oral  Pulse Rate 80  Pulse Rate Source Monitor  Resp 20  BP 111/76  BP Location Left Arm  BP Method Automatic  Patient Position (if appropriate) Lying  Oxygen Therapy  SpO2 100 %  O2 Device Room Air  Pain Assessment  Pain Scale 0-10  Pain Score 0  Dialysis Weight  Weight 106.5 kg  Type of Weight Pre-Dialysis  Time-Out for Hemodialysis  What Procedure? hd  Pt Identifiers(min of two) First/Last Name;MRN/Account#  Correct Site? Yes  Correct Side? Yes  Correct Procedure? Yes  Consents Verified? Yes  Rad Studies Available? N/A  Safety Precautions Reviewed? Yes  Engineer, civil (consulting) Number 5  Station Number 1  UF/Alarm Test Passed  Conductivity: Meter 14  Conductivity: Machine  14  pH 7.3  Reverse Osmosis main  Normal Saline Lot Number [id number]  Dialyzer Lot Number [id number]  Disposable Set Lot Number [id number]  Machine Temperature 98.6 F (37 C)  Musician and Audible Yes  Blood Lines Intact and Secured Yes  Pre Treatment Patient Checks  Vascular access used during treatment Catheter  Hepatitis B Surface Antigen Results Negative  Date Hepatitis B Surface Antigen Drawn 10/07/18  Hepatitis B Surface Antibody  (<10)  Date Hepatitis B Surface Antibody Drawn 10/07/18  Hemodialysis Consent Verified Yes  Hemodialysis Standing Orders Initiated Yes  ECG (Telemetry) Monitor On Yes  Prime Ordered Normal Saline  Length of  DialysisTreatment -hour(s) 2.5 Hour(s)  Dialyzer  (15h)  Dialysate 2K, 2.5 Ca  Variable Sodium  (140)  Dialysis Anticoagulant None  Dialysate Flow Ordered 500  Blood Flow Rate Ordered 250 mL/min  Ultrafiltration Goal 0 Liters  Dialysis Blood Pressure Support Ordered Normal Saline  Education / Care Plan  Dialysis Education Provided Yes  Documented Education in Care Plan Yes

## 2018-10-08 NOTE — Progress Notes (Signed)
[facility], [place] 10/08/18  Subjective:   Patient known to our practice from previous admission Patient was admitted in May for right heel osteomyelitis and underwent debridement and angiogram.  He was treated with IV vancomycin initially which was subsequently changed to Rocephin and daptomycin. Outpatient, his creatinine has continued to worsen Creatinine was 2.3 at the time of discharge but continued to increase.  On 5/25 noted to be 4.41.  Daptomycin and ceftriaxone was held.  Subsequently on June 1, creatinine was noted to be more than 8.  Patient was asked to come into the hospital where creatinine is now noted to be 10.7.    Patient looks and feels better Tolerated his dialysis treatment well. Potassium still elevated at 5.7 today Appetite is getting better  Objective:  Vital signs in last 24 hours:  Temp:  [97.6 F (36.4 C)-98.4 F (36.9 C)] 98.3 F (36.8 C) (06/06 1151) Pulse Rate:  [78-88] 79 (06/06 1151) Resp:  [14-22] 18 (06/06 1151) BP: (109-156)/(64-89) 156/88 (06/06 1151) SpO2:  [97 %-100 %] 100 % (06/06 1151) Weight:  [77.7 kg-104.3 kg] 104.3 kg (06/05 1802)  Weight change:  Filed Weights   10/07/18 1515 10/07/18 1730 10/07/18 1802  Weight: 77.8 kg 77.7 kg 104.3 kg    Intake/Output:    Intake/Output Summary (Last 24 hours) at 10/08/2018 1250 Last data filed at 10/08/2018 0957 Gross per 24 hour  Intake 390.65 ml  Output 275 ml  Net 115.65 ml     Physical Exam: General:  No acute distress, lying in the bed  HEENT  moist oral mucous membranes  Neck  supple  Pulm/lungs  Normal breathing effort, clear to auscultation  CVS/Heart  regular, no rub  Abdomen:   Soft, nontender  Extremities:  Trace to 1+ edema  Neurologic:  Alert, oriented, small muscle atrophy noted in hands  Skin:  Wounds on heels bilaterally  Access:  rt IJ PC       Basic Metabolic Panel:  Recent Labs  Lab 10/05/18 0439 10/06/18 0715 10/07/18 0347  10/08/18 0557  NA 137 137 137 138  K 4.6 5.2* 5.8* 5.7*  CL 105 106 109 108  CO2 20* 18* 17* 19*  GLUCOSE 74 78 111* 156*  BUN 101* 97* 100* 81*  CREATININE 10.77* 10.68* 10.43* 8.67*  CALCIUM 7.7* 7.7* 7.5* 7.9*     CBC: Recent Labs  Lab 10/05/18 0439 10/06/18 0715 10/07/18 0347 10/08/18 0557  WBC 9.2 9.2 8.0 8.7  HGB 7.6* 7.6* 7.1* 8.4*  HCT 24.6* 24.7* 23.2* 27.9*  MCV 87.5 87.6 88.5 87.7  PLT 196 201 193 217      Lab Results  Component Value Date   HEPBSAG Negative 10/07/2018   HEPBSAB Non Reactive 10/07/2018   HEPBIGM Negative 01/06/2017      Microbiology:  Recent Results (from the past 240 hour(s))  CULTURE, BLOOD (ROUTINE X 2) w Reflex to ID Panel     Status: None (Preliminary result)   Collection Time: 10/05/18 12:59 PM  Result Value Ref Range Status   Specimen Description BLOOD BLOOD RIGHT ARM  Final   Special Requests   Final    BOTTLES DRAWN AEROBIC AND ANAEROBIC Blood Culture adequate volume   Culture   Final    NO GROWTH 3 DAYS Performed at [facility], [address] [place], [place] [address]    Report Status PENDING  Incomplete  CULTURE, BLOOD (ROUTINE X 2) w Reflex to ID Panel  Status: None (Preliminary result)   Collection Time: 10/05/18  2:20 PM  Result Value Ref Range Status   Specimen Description BLOOD LEFT ANTECUBITAL  Final   Special Requests   Final    BOTTLES DRAWN AEROBIC AND ANAEROBIC Blood Culture adequate volume   Culture   Final    NO GROWTH 3 DAYS Performed at [facility], [address] [place], [place] [address]    Report Status PENDING  Incomplete  C difficile quick scan w PCR reflex     Status: None   Collection Time: 10/06/18  4:51 PM  Result Value Ref Range Status   C Diff antigen NEGATIVE NEGATIVE Final   C Diff toxin NEGATIVE NEGATIVE Final   C Diff interpretation No C. difficile detected.  Final    Comment: Performed at [facility], [address] [place], [place]  [address]  Gastrointestinal Panel by PCR , Stool     Status: None   Collection Time: 10/06/18  8:22 PM  Result Value Ref Range Status   Campylobacter species NOT DETECTED NOT DETECTED Final   Plesimonas shigelloides NOT DETECTED NOT DETECTED Final   Salmonella species NOT DETECTED NOT DETECTED Final   Yersinia enterocolitica NOT DETECTED NOT DETECTED Final   Vibrio species NOT DETECTED NOT DETECTED Final   Vibrio cholerae NOT DETECTED NOT DETECTED Final   Enteroaggregative E coli (EAEC) NOT DETECTED NOT DETECTED Final   Enteropathogenic E coli (EPEC) NOT DETECTED NOT DETECTED Final   Enterotoxigenic E coli (ETEC) NOT DETECTED NOT DETECTED Final   Shiga like toxin producing E coli (STEC) NOT DETECTED NOT DETECTED Final   Shigella/Enteroinvasive E coli (EIEC) NOT DETECTED NOT DETECTED Final   Cryptosporidium NOT DETECTED NOT DETECTED Final   Cyclospora cayetanensis NOT DETECTED NOT DETECTED Final   Entamoeba histolytica NOT DETECTED NOT DETECTED Final   Giardia lamblia NOT DETECTED NOT DETECTED Final   Adenovirus F40/41 NOT DETECTED NOT DETECTED Final   Astrovirus NOT DETECTED NOT DETECTED Final   Norovirus GI/GII NOT DETECTED NOT DETECTED Final   Rotavirus A NOT DETECTED NOT DETECTED Final   Sapovirus (I, II, IV, and V) NOT DETECTED NOT DETECTED Final    Comment: Performed at [facility] [facility], [address] [place], [place] [address]  Surgical PCR screen     Status: Abnormal   Collection Time: 10/07/18  8:25 AM  Result Value Ref Range Status   MRSA, PCR POSITIVE (A) NEGATIVE Final    Comment: RESULT CALLED TO, READ BACK BY AND VERIFIED WITH:  Lucious Groves AT 1324 10/07/2018 SDR    Staphylococcus aureus POSITIVE (A) NEGATIVE Final    Comment: (NOTE) The Xpert SA Assay (FDA approved for NASAL specimens in patients 64 years of age and older), is one component of a comprehensive surveillance program. It is not intended to diagnose infection nor to guide or monitor  treatment. Performed at [facility], [address] [place], [place] [address]     Coagulation Studies: No results for input(s): LABPROT, INR in the last 72 hours.  Urinalysis: Recent Labs    10/05/18 1326  COLORURINE YELLOW*  LABSPEC 1.009  PHURINE 5.0  GLUCOSEU NEGATIVE  HGBUR SMALL*  BILIRUBINUR NEGATIVE  KETONESUR NEGATIVE  PROTEINUR 30*  NITRITE NEGATIVE  LEUKOCYTESUR NEGATIVE      Imaging: No results found.   Medications:   . sodium chloride Stopped (10/05/18 0422)  . DAPTOmycin (CUBICIN)  IV Stopped (10/06/18 2206)   . allopurinol  100 mg Oral Daily  .  amiodarone  200 mg Oral Daily  . aspirin EC  81 mg Oral Daily  . carvedilol  3.125 mg Oral BID  . Chlorhexidine Gluconate Cloth  6 each Topical Q0600  . cholecalciferol  5,000 Units Oral Weekly  . clopidogrel  75 mg Oral Daily  . [START ON 10/10/2018] epoetin (EPOGEN/PROCRIT) injection  4,000 Units Intravenous Q M,W,F-HD  . gabapentin  100 mg Oral TID  . heparin  5,000 Units Subcutaneous Q8H  . hydrocerin   Topical Daily  . insulin aspart  0-5 Units Subcutaneous QHS  . insulin aspart  0-9 Units Subcutaneous TID WC  . metroNIDAZOLE  500 mg Oral BID  . mupirocin ointment  1 application Nasal BID  . predniSONE  5 mg Oral Q breakfast   sodium chloride, acetaminophen **OR** acetaminophen, HYDROmorphone (DILAUDID) injection, loperamide, ondansetron **OR** ondansetron (ZOFRAN) IV, ondansetron (ZOFRAN) IV, oxyCODONE, polyethylene glycol  Assessment/ Plan:  53 y.o. African-American male with type 2 diabetes, diabetic neuropathy, hypertension, coronary disease, congestive heart failure with LVEF of 25 to 30% in January 3748, grade 1 diastolic dysfunction, ICD, history of CABG in 2010, carotid stenosis, stroke, obstructive sleep apnea, peripheral vascular disease, gout, osteomyelitis of heel  1.  Acute kidney injury on chronic kidney disease stage IV Renal ultrasound is negative for obstruction, shows  mild increased echogenicity.  Urinalysis shows mild proteinuria.  0-5 RBCs and WBCs. Differential diagnosis is vast but most likely patient has severe ATN from drug toxicity such as vancomycin in the setting of volume depletion from torsemide. We will obtain SPEP, UPEP, urine protein to creatinine ratio.  Given on active urine sediment, Given relatively benign urinalysis, GN is less likely  Patient started on trial of dialysis for AKI. Second treatment today.  Tolerating well Plan for outpatient placement  2.  Right heel osteomyelitis.  Status post debridement on 5/7 by Dr. Elvina Mattes and angiogram on 5/8 by Dr. Delana Meyer. IV antibiotics as per ID team  3.  Diabetes type 2 with CKD Insulin-dependent Complications include diabetic foot ulcer and osteomyelitis Lab Results  Component Value Date   HGBA1C 7.9 (H) 05/25/2018     4.  Anemia of chronic kidney disease Lab Results  Component Value Date   HGB 8.4 (L) 10/08/2018  ESA with hemodialysis    LOS: [place] 6/6/202012:50 PM  [place], [place]  Note: This note was prepared with Dragon dictation. Any transcription errors are unintentional.

## 2018-10-08 NOTE — Progress Notes (Signed)
Hd start   10/08/18 1845  Vital Signs  Temp 98 F (36.7 C)  Temp Source Oral  Pulse Rate 80  Pulse Rate Source Monitor  Resp 20  BP 125/74  BP Location Left Arm  BP Method Automatic  Patient Position (if appropriate) Lying  Oxygen Therapy  SpO2 100 %  O2 Device Room Air  During Hemodialysis Assessment  Blood Flow Rate (mL/min) 250 mL/min  Arterial Pressure (mmHg) -60 mmHg  Venous Pressure (mmHg) 60 mmHg  Transmembrane Pressure (mmHg) 70 mmHg  Ultrafiltration Rate (mL/min) 200 mL/min  Dialysate Flow Rate (mL/min) 500 ml/min  Conductivity: Machine  13.9  HD Safety Checks Performed Yes  Dialysis Fluid Bolus Normal Saline  Bolus Amount (mL) 250 mL  Intra-Hemodialysis Comments Tx initiated (cvc care per policy)

## 2018-10-08 NOTE — Progress Notes (Signed)
Pre-hd   10/08/18 1830  Hand-Off documentation  Report received from (Full Name) Malka Sinwany   Vital Signs  Temp 98 F (36.7 C)  Temp Source Oral  Pulse Rate 80  Pulse Rate Source Monitor  Resp 20  BP 111/76  BP Location Left Arm  BP Method Automatic  Patient Position (if appropriate) Lying  Oxygen Therapy  SpO2 100 %  O2 Device Room Air  Pain Assessment  Pain Scale 0-10  Pain Score 0  Dialysis Weight  Weight 106.5 kg  Type of Weight Pre-Dialysis  Time-Out for Hemodialysis  What Procedure? hd  Pt Identifiers(min of two) First/Last Name;MRN/Account#  Correct Site? Yes  Correct Side? Yes  Correct Procedure? Yes  Consents Verified? Yes  Rad Studies Available? N/A  Safety Precautions Reviewed? Yes  Engineer, civil (consulting) Number 5  Station Number 1  UF/Alarm Test Passed  Conductivity: Meter 14  Conductivity: Machine  14  pH 7.3  Reverse Osmosis main  Normal Saline Lot Number [id number]  Dialyzer Lot Number [id number]  Disposable Set Lot Number [id number]  Machine Temperature 98.6 F (37 C)  Musician and Audible Yes  Blood Lines Intact and Secured Yes  Pre Treatment Patient Checks  Vascular access used during treatment Catheter  Hepatitis B Surface Antigen Results Negative  Date Hepatitis B Surface Antigen Drawn 10/07/18  Hepatitis B Surface Antibody  (<10)  Date Hepatitis B Surface Antibody Drawn 10/07/18  Hemodialysis Consent Verified Yes  Hemodialysis Standing Orders Initiated Yes  ECG (Telemetry) Monitor On Yes  Prime Ordered Normal Saline  Length of  DialysisTreatment -hour(s) 2.5 Hour(s)  Dialyzer  (15h)  Dialysate 2K, 2.5 Ca  Variable Sodium  (140)  Dialysis Anticoagulant None  Dialysate Flow Ordered 500  Blood Flow Rate Ordered 250 mL/min  Ultrafiltration Goal 0 Liters  Dialysis Blood Pressure Support Ordered Normal Saline  Education / Care Plan  Dialysis Education Provided Yes  Documented Education in Care Plan Yes

## 2018-10-08 NOTE — Progress Notes (Signed)
Pt surgical site has some bleeding under the dressing which this nurse marked with black marker around the drainage. Pt report soreness but no pain. Will continue to monitor.

## 2018-10-09 LAB — GLUCOSE, CAPILLARY
Glucose-Capillary: 74 mg/dL (ref 70–99)
Glucose-Capillary: 140 mg/dL — ABNORMAL HIGH (ref 70–99)
Glucose-Capillary: 147 mg/dL — ABNORMAL HIGH (ref 70–99)
Glucose-Capillary: 145 mg/dL — ABNORMAL HIGH (ref 70–99)

## 2018-10-09 NOTE — Progress Notes (Signed)
Patient ID: Paul Ala., male   DOB: 09/28/1965, 53 y.o.   MRN: [id number]  [employer] PROGRESS NOTE  Paul Ala. TZG:[id number] DOB: [birth year] DOA: 10/04/2018 PCP: Tracie Harrier, MD  HPI/Subjective: Patient feeling good.  Hopeful that he can get out of the hospital soon.  He recently moved.  Objective: Vitals:   10/09/18 0435 10/09/18 0904  BP: 127/81 (!) 152/85  Pulse: 80 88  Resp: 20 18  Temp: 98.8 F (37.1 C) 99.1 F (37.3 C)  SpO2: 100% 99%    Filed Weights   10/08/18 1830 10/08/18 2123 10/08/18 2341  Weight: 106.5 kg 105.9 kg 135.7 kg    ROS: Review of Systems  Constitutional: Negative for chills and fever.  Eyes: Negative for blurred vision.  Respiratory: Negative for cough and shortness of breath.   Cardiovascular: Negative for chest pain.  Gastrointestinal: Negative for abdominal pain, constipation, diarrhea, nausea and vomiting.  Genitourinary: Negative for dysuria.  Musculoskeletal: Negative for joint pain.  Neurological: Negative for dizziness and headaches.   Exam: Physical Exam  Constitutional: He is oriented to person, place, and time.  HENT:  Nose: No mucosal edema.  Mouth/Throat: No oropharyngeal exudate or posterior oropharyngeal edema.  Eyes: Pupils are equal, round, and reactive to light. Conjunctivae, EOM and lids are normal.  Neck: No JVD present. Carotid bruit is not present. No edema present. No thyroid mass and no thyromegaly present.  Cardiovascular: S1 normal and S2 normal. Exam reveals no gallop.  No murmur heard. Pulses:      Dorsalis pedis pulses are 2+ on the right side and 2+ on the left side.  Respiratory: No respiratory distress. He has no wheezes. He has no rhonchi. He has no rales.  GI: Soft. Bowel sounds are normal. There is no abdominal tenderness.  Musculoskeletal:     Right ankle: He exhibits swelling.     Left ankle: He exhibits swelling.  Lymphadenopathy:    He has no cervical adenopathy.  Neurological:  He is alert and oriented to person, place, and time. No cranial nerve deficit.  Skin: Skin is warm. Nails show no clubbing.  Right ankle sutures intact.  There is one spot on his ankle that is open that will close by secondary intention.  No erythema seen.  Psychiatric: He has a normal mood and affect.      Data Reviewed: Basic Metabolic Panel: Recent Labs  Lab 10/05/18 0439 10/06/18 0715 10/07/18 0347 10/08/18 0557  NA 137 137 137 138  K 4.6 5.2* 5.8* 5.7*  CL 105 106 109 108  CO2 20* 18* 17* 19*  GLUCOSE 74 78 111* 156*  BUN 101* 97* 100* 81*  CREATININE 10.77* 10.68* 10.43* 8.67*  CALCIUM 7.7* 7.7* 7.5* 7.9*   CBC: Recent Labs  Lab 10/05/18 0439 10/06/18 0715 10/07/18 0347 10/08/18 0557  WBC 9.2 9.2 8.0 8.7  HGB 7.6* 7.6* 7.1* 8.4*  HCT 24.6* 24.7* 23.2* 27.9*  MCV 87.5 87.6 88.5 87.7  PLT 196 201 193 217   Cardiac Enzymes: Recent Labs  Lab 10/05/18 0439 10/06/18 0715  CKTOTAL 136 126   BNP (last 3 results) Recent Labs    05/25/18 0038  BNP 129.0*    CBG: Recent Labs  Lab 10/08/18 1200 10/08/18 1651 10/08/18 2204 10/09/18 0741 10/09/18 1218  GLUCAP 172* 143* 170* 74 140*    Recent Results (from the past 240 hour(s))  CULTURE, BLOOD (ROUTINE X 2) w Reflex to ID Panel     Status: None (  Preliminary result)   Collection Time: 10/05/18 12:59 PM  Result Value Ref Range Status   Specimen Description BLOOD BLOOD RIGHT ARM  Final   Special Requests   Final    BOTTLES DRAWN AEROBIC AND ANAEROBIC Blood Culture adequate volume   Culture   Final    NO GROWTH 4 DAYS Performed at [facility], [address] [place], [place] [address]    Report Status PENDING  Incomplete  CULTURE, BLOOD (ROUTINE X 2) w Reflex to ID Panel     Status: None (Preliminary result)   Collection Time: 10/05/18  2:20 PM  Result Value Ref Range Status   Specimen Description BLOOD LEFT ANTECUBITAL  Final   Special Requests   Final    BOTTLES DRAWN AEROBIC AND  ANAEROBIC Blood Culture adequate volume   Culture   Final    NO GROWTH 4 DAYS Performed at [facility], [address] [place], [place] [address]    Report Status PENDING  Incomplete  C difficile quick scan w PCR reflex     Status: None   Collection Time: 10/06/18  4:51 PM  Result Value Ref Range Status   C Diff antigen NEGATIVE NEGATIVE Final   C Diff toxin NEGATIVE NEGATIVE Final   C Diff interpretation No C. difficile detected.  Final    Comment: Performed at [facility], [address] [place], [place] [address]  Gastrointestinal Panel by PCR , Stool     Status: None   Collection Time: 10/06/18  8:22 PM  Result Value Ref Range Status   Campylobacter species NOT DETECTED NOT DETECTED Final   Plesimonas shigelloides NOT DETECTED NOT DETECTED Final   Salmonella species NOT DETECTED NOT DETECTED Final   Yersinia enterocolitica NOT DETECTED NOT DETECTED Final   Vibrio species NOT DETECTED NOT DETECTED Final   Vibrio cholerae NOT DETECTED NOT DETECTED Final   Enteroaggregative E coli (EAEC) NOT DETECTED NOT DETECTED Final   Enteropathogenic E coli (EPEC) NOT DETECTED NOT DETECTED Final   Enterotoxigenic E coli (ETEC) NOT DETECTED NOT DETECTED Final   Shiga like toxin producing E coli (STEC) NOT DETECTED NOT DETECTED Final   Shigella/Enteroinvasive E coli (EIEC) NOT DETECTED NOT DETECTED Final   Cryptosporidium NOT DETECTED NOT DETECTED Final   Cyclospora cayetanensis NOT DETECTED NOT DETECTED Final   Entamoeba histolytica NOT DETECTED NOT DETECTED Final   Giardia lamblia NOT DETECTED NOT DETECTED Final   Adenovirus F40/41 NOT DETECTED NOT DETECTED Final   Astrovirus NOT DETECTED NOT DETECTED Final   Norovirus GI/GII NOT DETECTED NOT DETECTED Final   Rotavirus A NOT DETECTED NOT DETECTED Final   Sapovirus (I, II, IV, and V) NOT DETECTED NOT DETECTED Final    Comment: Performed at [facility], [address] [place], [place] [address]  Surgical PCR  screen     Status: Abnormal   Collection Time: 10/07/18  8:25 AM  Result Value Ref Range Status   MRSA, PCR POSITIVE (A) NEGATIVE Final    Comment: RESULT CALLED TO, READ BACK BY AND VERIFIED WITH:  Lucious Groves AT 6010 10/07/2018 SDR    Staphylococcus aureus POSITIVE (A) NEGATIVE Final    Comment: (NOTE) The Xpert SA Assay (FDA approved for NASAL specimens in patients 29 years of age and older), is one component of a comprehensive surveillance program. It is not intended to diagnose infection nor to guide or monitor treatment. Performed at [facility], [address] [place], [place] [address]  Scheduled Meds: . allopurinol  100 mg Oral Daily  . amiodarone  200 mg Oral Daily  . aspirin EC  81 mg Oral Daily  . carvedilol  3.125 mg Oral BID  . Chlorhexidine Gluconate Cloth  6 each Topical Q0600  . cholecalciferol  5,000 Units Oral Weekly  . clopidogrel  75 mg Oral Daily  . [START ON 10/10/2018] epoetin (EPOGEN/PROCRIT) injection  4,000 Units Intravenous Q M,W,F-HD  . gabapentin  100 mg Oral TID  . heparin  5,000 Units Subcutaneous Q8H  . hydrocerin   Topical Daily  . insulin aspart  0-5 Units Subcutaneous QHS  . insulin aspart  0-9 Units Subcutaneous TID WC  . metroNIDAZOLE  500 mg Oral BID  . mupirocin ointment  1 application Nasal BID  . predniSONE  5 mg Oral Q breakfast   Continuous Infusions: . sodium chloride Stopped (10/09/18 0024)  . DAPTOmycin (CUBICIN)  IV Stopped (10/08/18 2354)    Assessment/Plan:  1. Acute kidney injury likely progressed to end-stage renal disease.  PermCath in the right chest.  Had 2 dialysis sessions.  Will need an outpatient slot in 3 dialysis sessions prior to discharge. 2. Hyperkalemia.  Dialysis to manage. 3. Osteomyelitis right calcaneus.  Patient underwent surgery for necrotic Achilles tendon on 09/08/2018.  Patient on daptomycin through 10/10/2018.  Will confirm with infectious disease on whether we can stop antibiotics  tomorrow. 4. Chronic systolic heart failure with anasarca.  Dialysis should help out with managing fluid.  On low-dose Coreg. 5. Chronic atrial fibrillation.  Taken off anticoagulation.  On aspirin and Plavix at this point.  On amiodarone. 6. Type 2 diabetes mellitus on sliding scale 7. Diabetic neuropathy on low-dose gabapentin 8. Chronic gout on low-dose prednisone and low-dose allopurinol  Code Status:     Code Status Orders  (From admission, onward)         Start     Ordered   10/04/18 1959  Full code  Continuous     10/04/18 1958        Code Status History    Date Active Date Inactive Code Status Order ID Comments User Context   09/06/2018 0259 09/12/2018 2017 Full Code [id number]  Lance Coon, MD Inpatient   07/25/2018 1717 08/02/2018 1919 Full Code [id number]  Dustin Flock, MD Inpatient   05/25/2018 0352 05/29/2018 2022 Full Code [id number]  Hillary Bow, MD ED   10/09/2016 1003 10/09/2016 1340 Full Code [id number]  Samara Deist, [place] Inpatient   08/22/2016 0042 08/27/2016 1649 Full Code [id number]  Lance Coon, MD Inpatient   07/18/2014 1818 07/20/2014 2002 Full Code [id number]  Leighton Parody, PA-C Inpatient      Disposition Plan: To be determined  Consultants:  Nephrology  Vascular surgery  Podiatry  Antibiotics:  Daptomycin  Flagyl  Time spent: 27 minutes  [place]

## 2018-10-09 NOTE — Progress Notes (Signed)
[facility], [place] 10/09/18  Subjective:   Patient known to our practice from previous admission Patient was admitted in May for right heel osteomyelitis and underwent debridement and angiogram.  He was treated with IV vancomycin initially which was subsequently changed to Rocephin and daptomycin. Outpatient, his creatinine has continued to worsen Creatinine was 2.3 at the time of discharge but continued to increase.  On 5/25 noted to be 4.41.  Daptomycin and ceftriaxone was held.  Subsequently on June 1, creatinine was noted to be more than 8.  Patient was asked to come into the hospital where creatinine is now noted to be 10.7.    Patient looks and feels better Tolerated his dialysis treatmentd well. Appetite is getting better No new labs today  Objective:  Vital signs in last 24 hours:  Temp:  [97.5 F (36.4 C)-99.1 F (37.3 C)] 99.1 F (37.3 C) (06/07 0904) Pulse Rate:  [73-89] 88 (06/07 0904) Resp:  [11-20] 18 (06/07 0904) BP: (103-177)/(61-97) 152/85 (06/07 0904) SpO2:  [94 %-100 %] 99 % (06/07 0904) Weight:  [105.9 kg-135.7 kg] 135.7 kg (06/06 2341)  Weight change: 28.7 kg Filed Weights   10/08/18 1830 10/08/18 2123 10/08/18 2341  Weight: 106.5 kg 105.9 kg 135.7 kg    Intake/Output:    Intake/Output Summary (Last 24 hours) at 10/09/2018 1228 Last data filed at 10/09/2018 1013 Gross per 24 hour  Intake 480.03 ml  Output 250 ml  Net 230.03 ml     Physical Exam: General:  No acute distress, lying in the bed  HEENT  moist oral mucous membranes  Neck  supple  Pulm/lungs  Normal breathing effort, clear to auscultation  CVS/Heart  regular, no rub  Abdomen:   Soft, nontender  Extremities:  Trace to 1+ edema  Neurologic:  Alert, oriented, small muscle atrophy noted in hands  Skin:  Wounds on heels bilaterally  Access:  rt IJ PC       Basic Metabolic Panel:  Recent Labs  Lab 10/05/18 0439 10/06/18 0715 10/07/18 0347  10/08/18 0557  NA 137 137 137 138  K 4.6 5.2* 5.8* 5.7*  CL 105 106 109 108  CO2 20* 18* 17* 19*  GLUCOSE 74 78 111* 156*  BUN 101* 97* 100* 81*  CREATININE 10.77* 10.68* 10.43* 8.67*  CALCIUM 7.7* 7.7* 7.5* 7.9*     CBC: Recent Labs  Lab 10/05/18 0439 10/06/18 0715 10/07/18 0347 10/08/18 0557  WBC 9.2 9.2 8.0 8.7  HGB 7.6* 7.6* 7.1* 8.4*  HCT 24.6* 24.7* 23.2* 27.9*  MCV 87.5 87.6 88.5 87.7  PLT 196 201 193 217      Lab Results  Component Value Date   HEPBSAG Negative 10/07/2018   HEPBSAB Non Reactive 10/07/2018   HEPBIGM Negative 01/06/2017      Microbiology:  Recent Results (from the past 240 hour(s))  CULTURE, BLOOD (ROUTINE X 2) w Reflex to ID Panel     Status: None (Preliminary result)   Collection Time: 10/05/18 12:59 PM  Result Value Ref Range Status   Specimen Description BLOOD BLOOD RIGHT ARM  Final   Special Requests   Final    BOTTLES DRAWN AEROBIC AND ANAEROBIC Blood Culture adequate volume   Culture   Final    NO GROWTH 4 DAYS Performed at [facility], [address] [place], [place] [address]    Report Status PENDING  Incomplete  CULTURE, BLOOD (ROUTINE X 2) w Reflex to ID Panel     Status:  None (Preliminary result)   Collection Time: 10/05/18  2:20 PM  Result Value Ref Range Status   Specimen Description BLOOD LEFT ANTECUBITAL  Final   Special Requests   Final    BOTTLES DRAWN AEROBIC AND ANAEROBIC Blood Culture adequate volume   Culture   Final    NO GROWTH 4 DAYS Performed at [facility], [address], [place], [place] [address]    Report Status PENDING  Incomplete  C difficile quick scan w PCR reflex     Status: None   Collection Time: 10/06/18  4:51 PM  Result Value Ref Range Status   C Diff antigen NEGATIVE NEGATIVE Final   C Diff toxin NEGATIVE NEGATIVE Final   C Diff interpretation No C. difficile detected.  Final    Comment: Performed at [facility], [address] [place], [place]  [address]  Gastrointestinal Panel by PCR , Stool     Status: None   Collection Time: 10/06/18  8:22 PM  Result Value Ref Range Status   Campylobacter species NOT DETECTED NOT DETECTED Final   Plesimonas shigelloides NOT DETECTED NOT DETECTED Final   Salmonella species NOT DETECTED NOT DETECTED Final   Yersinia enterocolitica NOT DETECTED NOT DETECTED Final   Vibrio species NOT DETECTED NOT DETECTED Final   Vibrio cholerae NOT DETECTED NOT DETECTED Final   Enteroaggregative E coli (EAEC) NOT DETECTED NOT DETECTED Final   Enteropathogenic E coli (EPEC) NOT DETECTED NOT DETECTED Final   Enterotoxigenic E coli (ETEC) NOT DETECTED NOT DETECTED Final   Shiga like toxin producing E coli (STEC) NOT DETECTED NOT DETECTED Final   Shigella/Enteroinvasive E coli (EIEC) NOT DETECTED NOT DETECTED Final   Cryptosporidium NOT DETECTED NOT DETECTED Final   Cyclospora cayetanensis NOT DETECTED NOT DETECTED Final   Entamoeba histolytica NOT DETECTED NOT DETECTED Final   Giardia lamblia NOT DETECTED NOT DETECTED Final   Adenovirus F40/41 NOT DETECTED NOT DETECTED Final   Astrovirus NOT DETECTED NOT DETECTED Final   Norovirus GI/GII NOT DETECTED NOT DETECTED Final   Rotavirus A NOT DETECTED NOT DETECTED Final   Sapovirus (I, II, IV, and V) NOT DETECTED NOT DETECTED Final    Comment: Performed at [facility], [address] [place], [place] [address]  Surgical PCR screen     Status: Abnormal   Collection Time: 10/07/18  8:25 AM  Result Value Ref Range Status   MRSA, PCR POSITIVE (A) NEGATIVE Final    Comment: RESULT CALLED TO, READ BACK BY AND VERIFIED WITH:  Lucious Groves AT [id number] 10/07/2018 SDR    Staphylococcus aureus POSITIVE (A) NEGATIVE Final    Comment: (NOTE) The Xpert SA Assay (FDA approved for NASAL specimens in patients 69 years of age and older), is one component of a comprehensive surveillance program. It is not intended to diagnose infection nor to guide or monitor  treatment. Performed at [facility], [address] [place], [place] [address]     Coagulation Studies: No results for input(s): LABPROT, INR in the last 72 hours.  Urinalysis: No results for input(s): COLORURINE, LABSPEC, PHURINE, GLUCOSEU, HGBUR, BILIRUBINUR, KETONESUR, PROTEINUR, UROBILINOGEN, NITRITE, LEUKOCYTESUR in the last 72 hours.  Invalid input(s): APPERANCEUR    Imaging: No results found.   Medications:   . sodium chloride Stopped (10/09/18 0024)  . DAPTOmycin (CUBICIN)  IV Stopped (10/08/18 2354)   . allopurinol  100 mg Oral Daily  . amiodarone  200 mg Oral Daily  . aspirin EC  81 mg Oral  Daily  . carvedilol  3.125 mg Oral BID  . Chlorhexidine Gluconate Cloth  6 each Topical Q0600  . cholecalciferol  5,000 Units Oral Weekly  . clopidogrel  75 mg Oral Daily  . [START ON 10/10/2018] epoetin (EPOGEN/PROCRIT) injection  4,000 Units Intravenous Q M,W,F-HD  . gabapentin  100 mg Oral TID  . heparin  5,000 Units Subcutaneous Q8H  . hydrocerin   Topical Daily  . insulin aspart  0-5 Units Subcutaneous QHS  . insulin aspart  0-9 Units Subcutaneous TID WC  . metroNIDAZOLE  500 mg Oral BID  . mupirocin ointment  1 application Nasal BID  . predniSONE  5 mg Oral Q breakfast   sodium chloride, acetaminophen **OR** acetaminophen, HYDROmorphone (DILAUDID) injection, loperamide, ondansetron **OR** ondansetron (ZOFRAN) IV, ondansetron (ZOFRAN) IV, oxyCODONE, polyethylene glycol  Assessment/ Plan:  53 y.o. African-American male with type 2 diabetes, diabetic neuropathy, hypertension, coronary disease, congestive heart failure with LVEF of 25 to 30% in January 7414, grade 1 diastolic dysfunction, ICD, history of CABG in 2010, carotid stenosis, stroke, obstructive sleep apnea, peripheral vascular disease, gout, osteomyelitis of heel  1.  Acute kidney injury on chronic kidney disease stage IV Renal ultrasound is negative for obstruction, shows mild increased echogenicity.   Urinalysis shows mild proteinuria.  0-5 RBCs and WBCs. Differential diagnosis is vast but most likely patient has severe ATN from drug toxicity such as vancomycin in the setting of volume depletion from torsemide.  Patient started on trial of dialysis for AKI. Plan for outpatient placement at [facility] with dialysis as recommended by ID team  2.  Right heel osteomyelitis.  Status post debridement on 5/7 by Dr. Elvina Mattes and angiogram on 5/8 by Dr. Delana Meyer. IV antibiotics as per ID team  3.  Diabetes type 2 with CKD Insulin-dependent Complications include diabetic foot ulcer and osteomyelitis Lab Results  Component Value Date   HGBA1C 7.9 (H) 05/25/2018     4.  Anemia of chronic kidney disease Lab Results  Component Value Date   HGB 8.4 (L) 10/08/2018  ESA with hemodialysis    LOS: [place] 6/7/202012:28 PM  [place], [place]  Note: This note was prepared with Dragon dictation. Any transcription errors are unintentional.

## 2018-10-10 DIAGNOSIS — L97421 Non-pressure chronic ulcer of left heel and midfoot limited to breakdown of skin: Secondary | ICD-10-CM

## 2018-10-10 DIAGNOSIS — Z992 Dependence on renal dialysis: Secondary | ICD-10-CM

## 2018-10-10 DIAGNOSIS — L97411 Non-pressure chronic ulcer of right heel and midfoot limited to breakdown of skin: Secondary | ICD-10-CM

## 2018-10-10 LAB — GLUCOSE, CAPILLARY
Glucose-Capillary: 76 mg/dL (ref 70–99)
Glucose-Capillary: 125 mg/dL — ABNORMAL HIGH (ref 70–99)
Glucose-Capillary: 129 mg/dL — ABNORMAL HIGH (ref 70–99)
Glucose-Capillary: 154 mg/dL — ABNORMAL HIGH (ref 70–99)

## 2018-10-10 LAB — BASIC METABOLIC PANEL
Anion gap: 10 (ref 5–15)
BUN: 63 mg/dL — ABNORMAL HIGH (ref 6–20)
CO2: 20 mmol/L — ABNORMAL LOW (ref 22–32)
Calcium: 8 mg/dL — ABNORMAL LOW (ref 8.9–10.3)
Chloride: 106 mmol/L (ref 98–111)
Creatinine, Ser: 7.54 mg/dL — ABNORMAL HIGH (ref 0.61–1.24)
GFR calc Af Amer: 9 mL/min — ABNORMAL LOW (ref >60)
GFR calc non Af Amer: 7 mL/min — ABNORMAL LOW (ref >60)
Glucose, Bld: 131 mg/dL — ABNORMAL HIGH (ref 70–99)
Potassium: 5.1 mmol/L (ref 3.5–5.1)
Sodium: 136 mmol/L (ref 135–145)

## 2018-10-10 LAB — CULTURE, BLOOD (ROUTINE X 2)
Culture: NO GROWTH
Culture: NO GROWTH
Special Requests: ADEQUATE
Special Requests: ADEQUATE

## 2018-10-10 MED ORDER — SODIUM CHLORIDE 0.9 % IV SOLN
1.0000 g | INTRAVENOUS | Status: DC
  Administered 2018-10-10: 21:00:00 1 g via INTRAVENOUS
  Filled 2018-10-10 (×2): qty 1

## 2018-10-10 MED ORDER — DICYCLOMINE HCL 10 MG PO CAPS
10.0000 mg | ORAL_CAPSULE | Freq: Three times a day (TID) | ORAL | Status: DC
  Administered 2018-10-10 – 2018-10-11 (×4): 10 mg via ORAL
  Filled 2018-10-10 (×7): qty 1

## 2018-10-10 NOTE — TOC Progression Note (Signed)
Transition of Care (TOC) - Progression Note    Patient Details  Name: Paul [name]. MRN: [id number] Date of Birth: 1965-06-07  Transition of Care [facility]) CM/SW [place], [place] Phone Number: 10/10/2018, 10:47 AM  Clinical Narrative:  Patient will now need outpatient dialysis at discharge. CSW notified [name] with [employer] of need for dialysis chair time. [name] states that she will work on setting up a chair time for patient and let CSW know. CSW will continue to follow for discharge planning.      Expected Discharge Plan: [place] Barriers to Discharge: Continued Medical Work up  Expected Discharge Plan and Services Expected Discharge Plan: [place] Choice: [place] arrangements for the past 2 months: [place] Determinants of Health (SDOH) Interventions    Readmission Risk Interventions Readmission Risk Prevention Plan 10/06/2018 09/09/2018 08/02/2018  Transportation Screening Complete Complete Complete  PCP or Specialist Appt within 3-5 Days Complete Complete -  HRI or Home Care Consult Complete Complete Complete  Social Work Consult for [place] Planning/Counseling Complete Not Complete Complete  SW consult not completed comments - NA -  Palliative Care Screening Not Applicable Not Applicable Not Applicable  Medication Review (RN Care Manager) - - Complete  Some recent data might be hidden

## 2018-10-10 NOTE — Care Management Important Message (Signed)
Important Message  Patient Details  Name: Paul [name]. MRN: [id number] Date of Birth: 1965/06/05   Medicare Important Message Given:  Yes    [name] 10/10/2018, 11:54 AM

## 2018-10-10 NOTE — Progress Notes (Signed)
[place], [place] 10/10/18  Subjective:  Patient had another dialysis session today. Starting to feel better. Infectious disease also following him closely.   Objective:  Vital signs in last 24 hours:  Temp:  [97.6 F (36.4 C)-98.6 F (37 C)] 98.6 F (37 C) (06/08 1703) Pulse Rate:  [75-97] 85 (06/08 1703) Resp:  [12-20] 19 (06/08 1703) BP: (103-151)/(51-85) 137/51 (06/08 1703) SpO2:  [97 %-100 %] 100 % (06/08 1703) Weight:  [103.6 kg-105.1 kg] 103.6 kg (06/08 1703)  Weight change:  Filed Weights   10/08/18 2341 10/10/18 1351 10/10/18 1703  Weight: 135.7 kg 105.1 kg 103.6 kg    Intake/Output:    Intake/Output Summary (Last 24 hours) at 10/10/2018 1742 Last data filed at 10/10/2018 1703 Gross per 24 hour  Intake 120 ml  Output 1500 ml  Net -1380 ml     Physical Exam: General:  No acute distress, lying in the bed  HEENT  moist oral mucous membranes  Neck  supple  Pulm/lungs  Normal breathing effort, clear to auscultation  CVS/Heart  regular, no rub  Abdomen:   Soft, nontender  Extremities:  1+ edema  Neurologic:  Alert, oriented, small muscle atrophy noted in hands  Skin:  Wounds on heels bilaterally  Access:  rt IJ PC       Basic Metabolic Panel:  Recent Labs  Lab 10/05/18 0439 10/06/18 0715 10/07/18 0347 10/08/18 0557 10/10/18 1435  NA 137 137 137 138 136  K 4.6 5.2* 5.8* 5.7* 5.1  CL 105 106 109 108 106  CO2 20* 18* 17* 19* 20*  GLUCOSE 74 78 111* 156* 131*  BUN 101* 97* 100* 81* 63*  CREATININE 10.77* 10.68* 10.43* 8.67* 7.54*  CALCIUM 7.7* 7.7* 7.5* 7.9* 8.0*     CBC: Recent Labs  Lab 10/05/18 0439 10/06/18 0715 10/07/18 0347 10/08/18 0557  WBC 9.2 9.2 8.0 8.7  HGB 7.6* 7.6* 7.1* 8.4*  HCT 24.6* 24.7* 23.2* 27.9*  MCV 87.5 87.6 88.5 87.7  PLT 196 201 193 217      Lab Results  Component Value Date   HEPBSAG Negative 10/07/2018   HEPBSAB Non Reactive 10/07/2018   HEPBIGM Negative 01/06/2017       Microbiology:  Recent Results (from the past 240 hour(s))  CULTURE, BLOOD (ROUTINE X 2) w Reflex to ID Panel     Status: None   Collection Time: 10/05/18 12:59 PM  Result Value Ref Range Status   Specimen Description BLOOD BLOOD RIGHT ARM  Final   Special Requests   Final    BOTTLES DRAWN AEROBIC AND ANAEROBIC Blood Culture adequate volume   Culture   Final    NO GROWTH 5 DAYS Performed at [facility], [address] [place], [place] [address]    Report Status 10/10/2018 FINAL  Final  CULTURE, BLOOD (ROUTINE X 2) w Reflex to ID Panel     Status: None   Collection Time: 10/05/18  2:20 PM  Result Value Ref Range Status   Specimen Description BLOOD LEFT ANTECUBITAL  Final   Special Requests   Final    BOTTLES DRAWN AEROBIC AND ANAEROBIC Blood Culture adequate volume   Culture   Final    NO GROWTH 5 DAYS Performed at [facility], [address] [place], [place] [address]    Report Status 10/10/2018 FINAL  Final  C difficile quick scan w PCR reflex     Status: None   Collection Time: 10/06/18  4:51 PM  Result Value Ref Range Status   C Diff antigen NEGATIVE NEGATIVE Final   C Diff toxin NEGATIVE NEGATIVE Final   C Diff interpretation No C. difficile detected.  Final    Comment: Performed at [facility], [address] [place], [place] [address]  Gastrointestinal Panel by PCR , Stool     Status: None   Collection Time: 10/06/18  8:22 PM  Result Value Ref Range Status   Campylobacter species NOT DETECTED NOT DETECTED Final   Plesimonas shigelloides NOT DETECTED NOT DETECTED Final   Salmonella species NOT DETECTED NOT DETECTED Final   Yersinia enterocolitica NOT DETECTED NOT DETECTED Final   Vibrio species NOT DETECTED NOT DETECTED Final   Vibrio cholerae NOT DETECTED NOT DETECTED Final   Enteroaggregative E coli (EAEC) NOT DETECTED NOT DETECTED Final   Enteropathogenic E coli (EPEC) NOT DETECTED NOT DETECTED Final   Enterotoxigenic E  coli (ETEC) NOT DETECTED NOT DETECTED Final   Shiga like toxin producing E coli (STEC) NOT DETECTED NOT DETECTED Final   Shigella/Enteroinvasive E coli (EIEC) NOT DETECTED NOT DETECTED Final   Cryptosporidium NOT DETECTED NOT DETECTED Final   Cyclospora cayetanensis NOT DETECTED NOT DETECTED Final   Entamoeba histolytica NOT DETECTED NOT DETECTED Final   Giardia lamblia NOT DETECTED NOT DETECTED Final   Adenovirus F40/41 NOT DETECTED NOT DETECTED Final   Astrovirus NOT DETECTED NOT DETECTED Final   Norovirus GI/GII NOT DETECTED NOT DETECTED Final   Rotavirus A NOT DETECTED NOT DETECTED Final   Sapovirus (I, II, IV, and V) NOT DETECTED NOT DETECTED Final    Comment: Performed at [facility], [address] [place], [place] [address]  Surgical PCR screen     Status: Abnormal   Collection Time: 10/07/18  8:25 AM  Result Value Ref Range Status   MRSA, PCR POSITIVE (A) NEGATIVE Final    Comment: RESULT CALLED TO, READ BACK BY AND VERIFIED WITH:  Lucious Groves AT [id number] 10/07/2018 SDR    Staphylococcus aureus POSITIVE (A) NEGATIVE Final    Comment: (NOTE) The Xpert SA Assay (FDA approved for NASAL specimens in patients 22 years of age and older), is one component of a comprehensive surveillance program. It is not intended to diagnose infection nor to guide or monitor treatment. Performed at [facility], [address] [place], [place] [address]     Coagulation Studies: No results for input(s): LABPROT, INR in the last 72 hours.  Urinalysis: No results for input(s): COLORURINE, LABSPEC, PHURINE, GLUCOSEU, HGBUR, BILIRUBINUR, KETONESUR, PROTEINUR, UROBILINOGEN, NITRITE, LEUKOCYTESUR in the last 72 hours.  Invalid input(s): APPERANCEUR    Imaging: No results found.   Medications:   . sodium chloride Stopped (10/09/18 0024)  . DAPTOmycin (CUBICIN)  IV Stopped (10/08/18 2354)   . allopurinol  100 mg Oral Daily  . amiodarone  200 mg Oral Daily  . aspirin EC   81 mg Oral Daily  . carvedilol  3.125 mg Oral BID  . Chlorhexidine Gluconate Cloth  6 each Topical Q0600  . cholecalciferol  5,000 Units Oral Weekly  . clopidogrel  75 mg Oral Daily  . dicyclomine  10 mg Oral TID AC & HS  . epoetin (EPOGEN/PROCRIT) injection  4,000 Units Intravenous Q M,W,F-HD  . gabapentin  100 mg Oral TID  . heparin  5,000 Units Subcutaneous Q8H  . hydrocerin   Topical Daily  . insulin aspart  0-5 Units Subcutaneous QHS  . insulin aspart  0-9 Units Subcutaneous TID WC  .  metroNIDAZOLE  500 mg Oral BID  . mupirocin ointment  1 application Nasal BID  . predniSONE  5 mg Oral Q breakfast   sodium chloride, acetaminophen **OR** acetaminophen, HYDROmorphone (DILAUDID) injection, loperamide, ondansetron **OR** ondansetron (ZOFRAN) IV, ondansetron (ZOFRAN) IV, oxyCODONE, polyethylene glycol  Assessment/ Plan:  53 y.o. African-American male with type 2 diabetes, diabetic neuropathy, hypertension, coronary disease, congestive heart failure with LVEF of 25 to 30% in January 9678, grade 1 diastolic dysfunction, ICD, history of CABG in 2010, carotid stenosis, stroke, obstructive sleep apnea, peripheral vascular disease, gout, osteomyelitis of heel  1.  Acute kidney injury on chronic kidney disease stage IV Renal ultrasound is negative for obstruction, shows mild increased echogenicity.  Urinalysis shows mild proteinuria.  0-5 RBCs and WBCs. Differential diagnosis is vast but most likely patient has severe ATN from drug toxicity such as vancomycin in the setting of volume depletion from torsemide.  Patient completed dialysis session today.  Tolerated well.  Outpatient dialysis placement pending at [facility] dialysis unit.  2.  Right heel osteomyelitis.  Status post debridement on 5/7 by Dr. Elvina Mattes and angiogram on 5/8 by Dr. Delana Meyer. IV antibiotics as per ID team  3.  Diabetes type 2 with CKD Insulin-dependent Complications include diabetic foot ulcer and  osteomyelitis Lab Results  Component Value Date   HGBA1C 7.9 (H) 05/25/2018     4.  Anemia of chronic kidney disease Lab Results  Component Value Date   HGB 8.4 (L) 10/08/2018  Continue Epogen 4000 units IV on Monday, Wednesday, Friday.    LOS: 6 [place] 6/8/20205:42 PM  [place], [place]  Note: This note was prepared with Dragon dictation. Any transcription errors are unintentional.

## 2018-10-10 NOTE — Progress Notes (Signed)
HD Tx started w/o complication    92/42/68 1355  Vital Signs  Pulse Rate 96  Pulse Rate Source Monitor  Resp 19  BP 132/76  BP Location Left Arm  BP Method Automatic  Patient Position (if appropriate) Lying  Oxygen Therapy  SpO2 99 %  O2 Device Room Air  Pulse Oximetry Type Continuous  During Hemodialysis Assessment  Blood Flow Rate (mL/min) 300 mL/min  Arterial Pressure (mmHg) -110 mmHg  Venous Pressure (mmHg) 80 mmHg  Transmembrane Pressure (mmHg) 60 mmHg  Ultrafiltration Rate (mL/min) 720 mL/min  Dialysate Flow Rate (mL/min) 600 ml/min  Conductivity: Machine  14  HD Safety Checks Performed Yes  Dialysis Fluid Bolus Normal Saline  Bolus Amount (mL) 250 mL  Intra-Hemodialysis Comments Tx initiated

## 2018-10-10 NOTE — Progress Notes (Signed)
Patient to dialysis. Madlyn Frankel, RN

## 2018-10-10 NOTE — Progress Notes (Signed)
Patient returned from dialysis. Madlyn Frankel, RN

## 2018-10-10 NOTE — Progress Notes (Signed)
Post HD Tx  1.5 liters removed, pt tolerated tx well   10/10/18 1703  Hand-Off documentation  Report given to (Full Name) Shay RN 1C  Report received from (Full Name) Beatris Ship RN  Vital Signs  Temp 98.6 F (37 C)  Temp Source Oral  Pulse Rate 85  Pulse Rate Source Monitor  Resp 19  BP (!) 137/51  BP Method Automatic  Patient Position (if appropriate) Lying  Oxygen Therapy  SpO2 100 %  O2 Device Room Air  Pain Assessment  Pain Scale 0-10  Pain Score 0  Dialysis Weight  Weight 103.6 kg  Type of Weight Post-Dialysis  Post-Hemodialysis Assessment  Rinseback Volume (mL) 250 mL  KECN 45.8 V  Dialyzer Clearance Lightly streaked  Duration of HD Treatment -hour(s) 3.5 hour(s)  Hemodialysis Intake (mL) 500 mL  UF Total -Machine (mL) 2000 mL  Net UF (mL) 1500 mL  Tolerated HD Treatment Yes  Post-Hemodialysis Comments Tolerated well, ok to return to room  Note  Observations CVC capped and clamped, tolerated well. Heparin lock each lumen

## 2018-10-10 NOTE — Progress Notes (Signed)
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

## 2018-10-10 NOTE — Progress Notes (Signed)
Following patient for Hemodialysis outpatient placement. Barrier for chair time, waiting on [employer] to result.

## 2018-10-10 NOTE — Progress Notes (Signed)
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

## 2018-10-10 NOTE — Progress Notes (Signed)
HD Tx End 1.5 liters removed, tolerated tx well    10/10/18 1700  Vital Signs  Pulse Rate 83  Resp 15  BP 126/84  During Hemodialysis Assessment  Blood Flow Rate (mL/min) 200 mL/min  Arterial Pressure (mmHg) -110 mmHg  Venous Pressure (mmHg) 90 mmHg  Transmembrane Pressure (mmHg) 60 mmHg  Ultrafiltration Rate (mL/min) 720 mL/min  Dialysate Flow Rate (mL/min) 600 ml/min  Conductivity: Machine  14  HD Safety Checks Performed Yes  Dialysis Fluid Bolus Normal Saline  Bolus Amount (mL) 250 mL  Intra-Hemodialysis Comments Tx completed;Tolerated well

## 2018-10-10 NOTE — Progress Notes (Signed)
Patient ID: Paul Ala., male   DOB: 09/07/1965, 53 y.o.   MRN: [id number]  [facility] PROGRESS NOTE  Paul Ala. DGU:[id number] DOB: 12/24/1965 DOA: 10/04/2018 PCP: Tracie Harrier, MD  HPI/Subjective: Patient feeling good.  Third dialysis session will be today.  Objective: Vitals:   10/09/18 1945 10/10/18 0604  BP: (!) 151/78 110/66  Pulse: 97 80  Resp: 15 14  Temp: 97.6 F (36.4 C) 98.3 F (36.8 C)  SpO2: 97% 98%    Filed Weights   10/08/18 1830 10/08/18 2123 10/08/18 2341  Weight: 106.5 kg 105.9 kg 135.7 kg    ROS: Review of Systems  Constitutional: Negative for chills and fever.  Eyes: Negative for blurred vision.  Respiratory: Negative for cough and shortness of breath.   Cardiovascular: Negative for chest pain.  Gastrointestinal: Positive for diarrhea. Negative for abdominal pain, constipation, nausea and vomiting.  Genitourinary: Negative for dysuria.  Musculoskeletal: Negative for joint pain.  Neurological: Negative for dizziness and headaches.   Exam: Physical Exam  Constitutional: He is oriented to person, place, and time.  HENT:  Nose: No mucosal edema.  Mouth/Throat: No oropharyngeal exudate or posterior oropharyngeal edema.  Eyes: Pupils are equal, round, and reactive to light. Conjunctivae, EOM and lids are normal.  Neck: No JVD present. Carotid bruit is not present. No edema present. No thyroid mass and no thyromegaly present.  Cardiovascular: S1 normal and S2 normal. Exam reveals no gallop.  No murmur heard. Pulses:      Dorsalis pedis pulses are 2+ on the right side and 2+ on the left side.  Respiratory: No respiratory distress. He has no wheezes. He has no rhonchi. He has no rales.  GI: Soft. Bowel sounds are normal. There is no abdominal tenderness.  Musculoskeletal:     Right ankle: He exhibits swelling.     Left ankle: He exhibits swelling.  Lymphadenopathy:    He has no cervical adenopathy.  Neurological: He is alert and  oriented to person, place, and time. No cranial nerve deficit.  Skin: Skin is warm. Nails show no clubbing.  Right ankle sutures intact.  There is one spot on his ankle that is open that will close by secondary intention.  No erythema seen.  Psychiatric: He has a normal mood and affect.      Data Reviewed: Basic Metabolic Panel: Recent Labs  Lab 10/05/18 0439 10/06/18 0715 10/07/18 0347 10/08/18 0557  NA 137 137 137 138  K 4.6 5.2* 5.8* 5.7*  CL 105 106 109 108  CO2 20* 18* 17* 19*  GLUCOSE 74 78 111* 156*  BUN 101* 97* 100* 81*  CREATININE 10.77* 10.68* 10.43* 8.67*  CALCIUM 7.7* 7.7* 7.5* 7.9*   CBC: Recent Labs  Lab 10/05/18 0439 10/06/18 0715 10/07/18 0347 10/08/18 0557  WBC 9.2 9.2 8.0 8.7  HGB 7.6* 7.6* 7.1* 8.4*  HCT 24.6* 24.7* 23.2* 27.9*  MCV 87.5 87.6 88.5 87.7  PLT 196 201 193 217   Cardiac Enzymes: Recent Labs  Lab 10/05/18 0439 10/06/18 0715  CKTOTAL 136 126   BNP (last 3 results) Recent Labs    05/25/18 0038  BNP 129.0*    CBG: Recent Labs  Lab 10/09/18 1218 10/09/18 1639 10/09/18 2132 10/10/18 0735 10/10/18 1125  GLUCAP 140* 147* 145* 76 125*    Recent Results (from the past 240 hour(s))  CULTURE, BLOOD (ROUTINE X 2) w Reflex to ID Panel     Status: None   Collection Time: 10/05/18 12:59  PM  Result Value Ref Range Status   Specimen Description BLOOD BLOOD RIGHT ARM  Final   Special Requests   Final    BOTTLES DRAWN AEROBIC AND ANAEROBIC Blood Culture adequate volume   Culture   Final    NO GROWTH 5 DAYS Performed at [facility], [address] [place], [place] [address]    Report Status 10/10/2018 FINAL  Final  CULTURE, BLOOD (ROUTINE X 2) w Reflex to ID Panel     Status: None   Collection Time: 10/05/18  2:20 PM  Result Value Ref Range Status   Specimen Description BLOOD LEFT ANTECUBITAL  Final   Special Requests   Final    BOTTLES DRAWN AEROBIC AND ANAEROBIC Blood Culture adequate volume   Culture   Final     NO GROWTH 5 DAYS Performed at [facility], [address] [place], [place] [address]    Report Status 10/10/2018 FINAL  Final  C difficile quick scan w PCR reflex     Status: None   Collection Time: 10/06/18  4:51 PM  Result Value Ref Range Status   C Diff antigen NEGATIVE NEGATIVE Final   C Diff toxin NEGATIVE NEGATIVE Final   C Diff interpretation No C. difficile detected.  Final    Comment: Performed at [facility], [address] [place], [place] [address]  Gastrointestinal Panel by PCR , Stool     Status: None   Collection Time: 10/06/18  8:22 PM  Result Value Ref Range Status   Campylobacter species NOT DETECTED NOT DETECTED Final   Plesimonas shigelloides NOT DETECTED NOT DETECTED Final   Salmonella species NOT DETECTED NOT DETECTED Final   Yersinia enterocolitica NOT DETECTED NOT DETECTED Final   Vibrio species NOT DETECTED NOT DETECTED Final   Vibrio cholerae NOT DETECTED NOT DETECTED Final   Enteroaggregative E coli (EAEC) NOT DETECTED NOT DETECTED Final   Enteropathogenic E coli (EPEC) NOT DETECTED NOT DETECTED Final   Enterotoxigenic E coli (ETEC) NOT DETECTED NOT DETECTED Final   Shiga like toxin producing E coli (STEC) NOT DETECTED NOT DETECTED Final   Shigella/Enteroinvasive E coli (EIEC) NOT DETECTED NOT DETECTED Final   Cryptosporidium NOT DETECTED NOT DETECTED Final   Cyclospora cayetanensis NOT DETECTED NOT DETECTED Final   Entamoeba histolytica NOT DETECTED NOT DETECTED Final   Giardia lamblia NOT DETECTED NOT DETECTED Final   Adenovirus F40/41 NOT DETECTED NOT DETECTED Final   Astrovirus NOT DETECTED NOT DETECTED Final   Norovirus GI/GII NOT DETECTED NOT DETECTED Final   Rotavirus A NOT DETECTED NOT DETECTED Final   Sapovirus (I, II, IV, and V) NOT DETECTED NOT DETECTED Final    Comment: Performed at [facility], [address] [place], [place] [address]  Surgical PCR screen     Status: Abnormal   Collection Time:  10/07/18  8:25 AM  Result Value Ref Range Status   MRSA, PCR POSITIVE (A) NEGATIVE Final    Comment: RESULT CALLED TO, READ BACK BY AND VERIFIED WITH:  Lucious Groves AT [id number] 10/07/2018 SDR    Staphylococcus aureus POSITIVE (A) NEGATIVE Final    Comment: (NOTE) The Xpert SA Assay (FDA approved for NASAL specimens in patients 22 years of age and older), is one component of a comprehensive surveillance program. It is not intended to diagnose infection nor to guide or monitor treatment. Performed at [facility], [address], [place], [place] [address]       Scheduled Meds: . allopurinol  100 mg Oral Daily  . amiodarone  200 mg Oral Daily  . aspirin EC  81 mg Oral Daily  . carvedilol  3.125 mg Oral BID  . Chlorhexidine Gluconate Cloth  6 each Topical Q0600  . cholecalciferol  5,000 Units Oral Weekly  . clopidogrel  75 mg Oral Daily  . dicyclomine  10 mg Oral TID AC & HS  . epoetin (EPOGEN/PROCRIT) injection  4,000 Units Intravenous Q M,W,F-HD  . gabapentin  100 mg Oral TID  . heparin  5,000 Units Subcutaneous Q8H  . hydrocerin   Topical Daily  . insulin aspart  0-5 Units Subcutaneous QHS  . insulin aspart  0-9 Units Subcutaneous TID WC  . metroNIDAZOLE  500 mg Oral BID  . mupirocin ointment  1 application Nasal BID  . predniSONE  5 mg Oral Q breakfast   Continuous Infusions: . sodium chloride Stopped (10/09/18 0024)  . DAPTOmycin (CUBICIN)  IV Stopped (10/08/18 2354)    Assessment/Plan:  1. Acute kidney injury likely progressed to end-stage renal disease.  PermCath in the right chest.  Had 2 dialysis sessions.  Will need an outpatient slot.  Third dialysis session today.  Likely will be able to be discharged tomorrow morning. 2. Hyperkalemia.  Dialysis to manage.  Check BMP today after dialysis. 3. Osteomyelitis right calcaneus.  Patient underwent surgery for necrotic Achilles tendon on 09/08/2018.  Spoke with infectious disease doctor and she recommended 2 more  weeks of IV daptomycin with dialysis.  Nephrology confirmed that they can do daptomycin at the dialysis center.  Will need home health and follow-up with podiatry as outpatient. 4. Chronic systolic heart failure with anasarca.  Dialysis should help out with managing fluid.  On low-dose Coreg. 5. Chronic atrial fibrillation.  Taken off anticoagulation.  On aspirin and Plavix at this point.  On amiodarone. 6. Type 2 diabetes mellitus on sliding scale 7. Diabetic neuropathy on low-dose gabapentin 8. Chronic gout on low-dose prednisone and low-dose allopurinol 9. Patient having diarrhea after eating.  Will give a trial of Bentyl.  One Imodium last night with response.  Code Status:     Code Status Orders  (From admission, onward)         Start     Ordered   10/04/18 1959  Full code  Continuous     10/04/18 1958        Code Status History    Date Active Date Inactive Code Status Order ID Comments User Context   09/06/2018 0259 09/12/2018 2017 Full Code [id number]  Lance Coon, MD Inpatient   07/25/2018 1717 08/02/2018 1919 Full Code [id number]  Dustin Flock, MD Inpatient   05/25/2018 0352 05/29/2018 2022 Full Code [id number]  Hillary Bow, MD ED   10/09/2016 1003 10/09/2016 1340 Full Code [id number]  Samara Deist, [facility] Inpatient   08/22/2016 0042 08/27/2016 1649 Full Code [id number]  Lance Coon, MD Inpatient   07/18/2014 1818 07/20/2014 2002 Full Code [id number]  Leighton Parody, PA-C Inpatient      Disposition Plan: Likely will be discharged tomorrow with home with home health.  Consultants:  Nephrology  Vascular surgery  Podiatry  Antibiotics:  Daptomycin  Flagyl  Time spent: 27 minutes  [place]

## 2018-10-10 NOTE — Consult Note (Signed)
Pharmacy Antibiotic Note  Paul [name]. is a 53 y.o. male admitted on 10/04/2018 with Osteomyelitis.  Pharmacy has been consulted for Ceftazidime dosing. Pt on HD.   Plan: Will order ceftazidime 1 g q24H. Administer after hemodialysis on dialysis days.   Weight: 228 lb 6.3 oz (103.6 kg)  Temp (24hrs), Avg:98.2 F (36.8 C), Min:97.6 F (36.4 C), Max:98.6 F (37 C)  Recent Labs  Lab 10/05/18 0439 10/06/18 0715 10/07/18 0347 10/08/18 0557 10/10/18 1435  WBC 9.2 9.2 8.0 8.7  --   CREATININE 10.77* 10.68* 10.43* 8.67* 7.54*    Estimated Creatinine Clearance: 13.7 mL/min (A) (by C-G formula based on SCr of 7.54 mg/dL (H)).    Allergies  Allergen Reactions  . Other Other (See Comments)    Cardiac Problems. Pt states he tolerates Toradol. Due to kidney and heart problems per pt  . Ibuprofen Other (See Comments)    Heart problems  . Baclofen Other (See Comments)  . Metformin Diarrhea  . Nsaids     Due to kidney and heart problems per pt    Antimicrobials this admission: 6/2 daptomycin >>  6/2 metronidazole >>  6/8 ceftazidime >>   Dose adjustments this admission: None  Microbiology results: 5/7 Tissue Cx:  METHICILLIN RESISTANT STAPHYLOCOCCUS AUREUS  FEW PREVOTELLA BIVIA  BETA LACTAMASE POSITIVE  FEW ENTEROCOCCUS FAECALIS  6/3 BCx: NGTD  6/5 MRSA PCR: positive   Thank you for allowing pharmacy to be a part of this patient's care.  Oswald Hillock, PharmD, BCPS 10/10/2018 6:27 PM

## 2018-10-10 NOTE — Progress Notes (Signed)
Pre HD Tx    10/10/18 1351  Vital Signs  Temp 98.1 F (36.7 C)  Temp Source Oral  Pulse Rate 86  Pulse Rate Source Monitor  Resp 16  BP 130/79  BP Location Left Arm  BP Method Automatic  Patient Position (if appropriate) Lying  Oxygen Therapy  SpO2 99 %  O2 Device Room Air  Pulse Oximetry Type Continuous  Pain Assessment  Pain Scale 0-10  Pain Score 0  Dialysis Weight  Weight 105.1 kg  Type of Weight Pre-Dialysis  Time-Out for Hemodialysis  What Procedure? HD  Pt Identifiers(min of two) First/Last Name;MRN/Account#  Correct Site? Yes  Correct Side? Yes  Correct Procedure? Yes  Consents Verified? Yes  Rad Studies Available? N/A  Safety Precautions Reviewed? Yes  Engineer, civil (consulting) Number 2  Station Number 3  UF/Alarm Test Passed  Conductivity: Meter 14  Conductivity: Machine  14  pH 7.4  Reverse Osmosis Main  Normal Saline Lot Number [id number]  Dialyzer Lot Number [id number]  Disposable Set Lot Number [id number]  Machine Temperature 98.6 F (37 C)  Musician and Audible Yes  Blood Lines Intact and Secured Yes  Pre Treatment Patient Checks  Vascular access used during treatment Catheter  Hepatitis B Surface Antigen Results Negative  Date Hepatitis B Surface Antigen Drawn 10/07/18  Hepatitis B Surface Antibody  (<10)  Date Hepatitis B Surface Antibody Drawn 10/07/18  Hemodialysis Consent Verified Yes  Hemodialysis Standing Orders Initiated Yes  ECG (Telemetry) Monitor On Yes  Prime Ordered Normal Saline  Length of  DialysisTreatment -hour(s) 3 Hour(s)  Dialysis Treatment Comments Na 140  Dialyzer Elisio 17H NR  Dialysate 2K, 2.5 Ca  Dialysis Anticoagulant None  Dialysate Flow Ordered 600  Blood Flow Rate Ordered 300 mL/min  Ultrafiltration Goal 1.5 Liters  Pre Treatment Labs Renal panel  Dialysis Blood Pressure Support Ordered Normal Saline  Education / Care Plan  Dialysis Education Provided Yes  Documented Education in Care Plan Yes

## 2018-10-10 NOTE — Progress Notes (Signed)
ID  Subjective Doing well No new complaints  Objective Patient Vitals for the past 24 hrs:  BP Temp Temp src Pulse Resp SpO2  10/10/18 0604 110/66 98.3 F (36.8 C) Oral 80 14 98 %  10/09/18 1945 (!) 151/78 97.6 F (36.4 C) Oral 97 15 97 %  10/09/18 1456 128/72 98.7 F (37.1 C) Oral 82 16 99 %      Wound on the rt heel has sutures- some breakdown of the tissue    Labs CBC Latest Ref Rng & Units 10/08/2018 10/07/2018 10/06/2018  WBC 4.0 - 10.5 K/uL 8.7 8.0 9.2  Hemoglobin 13.0 - 17.0 g/dL 8.4(L) 7.1(L) 7.6(L)  Hematocrit 39.0 - 52.0 % 27.9(L) 23.2(L) 24.7(L)  Platelets 150 - 400 K/uL 217 193 201   CMP Latest Ref Rng & Units 10/08/2018 10/07/2018 10/06/2018  Glucose 70 - 99 mg/dL 156(H) 111(H) 78  BUN 6 - 20 mg/dL 81(H) 100(H) 97(H)  Creatinine 0.61 - 1.24 mg/dL 8.67(H) 10.43(H) 10.68(H)  Sodium 135 - 145 mmol/L 138 137 137  Potassium 3.5 - 5.1 mmol/L 5.7(H) 5.8(H) 5.2(H)  Chloride 98 - 111 mmol/L 108 109 106  CO2 22 - 32 mmol/L 19(L) 17(L) 18(L)  Calcium 8.9 - 10.3 mg/dL 7.9(L) 7.5(L) 7.7(L)  Total Protein 6.5 - 8.1 g/dL - - -  Total Bilirubin 0.3 - 1.2 mg/dL - - -  Alkaline Phos 38 - 126 U/L - - -  AST 15 - 41 U/L - - -  ALT 0 - 44 U/L - - -    Microbiology Blood culture 10/05/18 NG   [place]/Recommendation  53 year old male with history of diabetes mellitus, hypertension, coronary artery disease with bilateral heel ulcers  MRSA osteomyelitis of the right calcaneum. Plan was to give him 4 weeks of IV antibiotics until 10/10/2018 with IV daptomycin and IV ceftriaxone and p.o. Flagyl. He will need 2 more weeks of IV daptomycin  Given during dialysis  750mg  /750mg  and 1000mg    and Iv ceftazidime( instead of ceftriaxone) given during dialysis 1-1-2 grams  AKI on CKD- on dialysis   US kidneys no hydronephrosis UA no pyuria- has afew RBC Nephrology on board and planning for dialysis  DM- management as per primary team  HTN management as per primary team Discussed  the management with patient and nephrogolist

## 2018-10-11 ENCOUNTER — Inpatient Hospital Stay: Payer: Medicare Other

## 2018-10-11 LAB — GLUCOSE, CAPILLARY
Glucose-Capillary: 75 mg/dL (ref 70–99)
Glucose-Capillary: 149 mg/dL — ABNORMAL HIGH (ref 70–99)

## 2018-10-11 LAB — PROTEIN ELECTROPHORESIS, SERUM
A/G Ratio: 1.1 (ref 0.7–1.7)
Albumin ELP: 3.3 g/dL (ref 2.9–4.4)
Alpha-1-Globulin: 0.2 g/dL (ref 0.0–0.4)
Alpha-2-Globulin: 0.7 g/dL (ref 0.4–1.0)
Beta Globulin: 0.9 g/dL (ref 0.7–1.3)
Gamma Globulin: 1.3 g/dL (ref 0.4–1.8)
Globulin, Total: 3.1 g/dL (ref 2.2–3.9)
Total Protein ELP: 6.4 g/dL (ref 6.0–8.5)

## 2018-10-11 LAB — CBC
HCT: 25.7 % — ABNORMAL LOW (ref 39.0–52.0)
Hemoglobin: 7.8 g/dL — ABNORMAL LOW (ref 13.0–17.0)
MCH: 26.9 pg (ref 26.0–34.0)
MCHC: 30.4 g/dL (ref 30.0–36.0)
MCV: 88.6 fL (ref 80.0–100.0)
Platelets: 202 10*3/uL (ref 150–400)
RBC: 2.9 MIL/uL — ABNORMAL LOW (ref 4.22–5.81)
RDW: 17.8 % — ABNORMAL HIGH (ref 11.5–15.5)
WBC: 8.9 10*3/uL (ref 4.0–10.5)
nRBC: 0 % (ref 0.0–0.2)

## 2018-10-11 LAB — QUANTIFERON-TB GOLD PLUS (RQFGPL)
QuantiFERON Mitogen Value: >10 IU/mL
QuantiFERON Nil Value: 0.02 IU/mL
QuantiFERON TB1 Ag Value: 0.02 IU/mL
QuantiFERON TB2 Ag Value: 0.01 IU/mL

## 2018-10-11 LAB — CK: Total CK: 135 U/L (ref 49–397)

## 2018-10-11 LAB — C3 COMPLEMENT: C3 Complement: 76 mg/dL — ABNORMAL LOW (ref 82–167)

## 2018-10-11 LAB — KAPPA/LAMBDA LIGHT CHAINS
Kappa free light chain: 129.8 mg/L — ABNORMAL HIGH (ref 3.3–19.4)
Kappa, lambda light chain ratio: 2.03 — ABNORMAL HIGH (ref 0.26–1.65)
Lambda free light chains: 64 mg/L — ABNORMAL HIGH (ref 5.7–26.3)

## 2018-10-11 LAB — QUANTIFERON-TB GOLD PLUS: QuantiFERON-TB Gold Plus: NEGATIVE

## 2018-10-11 LAB — C4 COMPLEMENT: Complement C4, Body Fluid: 13 mg/dL — ABNORMAL LOW (ref 14–44)

## 2018-10-11 MED ORDER — DEXTROSE 5 % IV SOLN: INTRAVENOUS | 0 refills | Status: DC

## 2018-10-11 MED ORDER — LOPERAMIDE HCL 2 MG PO CAPS: 2.0000 mg | ORAL_CAPSULE | Freq: Three times a day (TID) | ORAL | 0 refills | Status: DC | PRN

## 2018-10-11 MED ORDER — HYDROCERIN EX CREA: TOPICAL_CREAM | CUTANEOUS | 0 refills | Status: DC

## 2018-10-11 MED ORDER — DAPTOMYCIN 500 MG IV SOLR: INTRAVENOUS | 0 refills | Status: AC

## 2018-10-11 MED ORDER — GABAPENTIN 100 MG PO CAPS: 100.0000 mg | ORAL_CAPSULE | Freq: Three times a day (TID) | ORAL | 0 refills | Status: DC

## 2018-10-11 MED ORDER — DICYCLOMINE HCL 10 MG PO CAPS: 10.0000 mg | ORAL_CAPSULE | Freq: Three times a day (TID) | ORAL | 0 refills | Status: DC

## 2018-10-11 MED ORDER — TORSEMIDE 20 MG PO TABS: ORAL_TABLET | ORAL | Status: DC

## 2018-10-11 MED ORDER — HYDROCODONE-ACETAMINOPHEN 5-325 MG PO TABS: 1.0000 | ORAL_TABLET | Freq: Two times a day (BID) | ORAL | 0 refills | Status: DC

## 2018-10-11 NOTE — Progress Notes (Signed)
Dressings changed per patient request. Discharge instructions given and went over with patient at bedside, all questions answered. Patient discharged home. Madlyn Frankel, RN

## 2018-10-11 NOTE — Progress Notes (Signed)
Patient accepted at [facility] TTS 11:40, starting on Thursday 6/11 at 11:00, patient educated and agreed to chair time.

## 2018-10-11 NOTE — Discharge Instructions (Signed)
Eating Plan for Dialysis Dialysis is a procedure that is done when the kidneys have stopped working properly (kidney failure). During dialysis, wastes, salt, and extra water are removed from the blood, and the levels of certain minerals in the blood are maintained. If you are undergoing dialysis, it is important to pay careful attention to what you eat. Between dialysis sessions, certain nutrients and wastes can build up in your blood and cause you to get sick. Even though nutrients, such as carbohydrates, fats, vitamins, and minerals, are an important part of a healthy diet, you may need to limit your intake of certain nutrients when you are on dialysis. When you are on dialysis, it is important that you work with your health care provider or a diet and nutrition specialist (dietitian) to help you make an eating plan that meets your specific needs. What are tips for following this plan? Reading food labels  Check food labels for the amount of: ? Potassium. This is found in milk, fruits, and vegetables. ? Phosphorus. This is found in milk, cheese, beans, nuts, and carbonated beverages. ? Sodium. Sodium content is high in processed and cured meats, ready-made frozen meals, canned vegetables, and salty snack foods.  Try to find foods that are low in potassium, phosphorus, and sodium.  Look for foods that are labeled "sodium free," "reduced sodium," or "low sodium." Shopping  Do not buy whole-grain and high-fiber foods because they contain high amounts of phosphorus.  Do not buy or use salt substitutes because they contain potassium.  Do not buy processed foods. These are usually high in sodium and phosphorus. Cooking  Drain all fluid from cooked vegetables and canned fruits before eating them.  Cut potatoes into small pieces and boil them in unsalted water before you eat them. This can help to remove some potassium from the potato.  To add flavor, try using herbs and spices that do not  contain sodium. Meal planning      Most people on dialysis should try to eat: ? 6-11 servings of grains each day. One serving is equal to 1 slice of bread or  cup of cooked rice or pasta. ? 2-3 servings of low-potassium vegetables each day. One serving is equal to  cup. ? 2-3 servings of low-potassium fruits each day. One serving is equal to  cup. ? High-quality proteins, such as meat, poultry, fish, and eggs. Talk with your health care provider or dietitian about the right amount and type of protein to include in your eating plan. ?  cup of dairy each day.  Avoid foods that are high in sodium and phosphorus. Foods that generally are very salty will be high in sodium, and foods that are high in fiber or starch may be high in phosphorus. General information  Follow your health care provider's instructions to restrict your fluid intake. You may be told to: ? Write down what you drink and any foods you eat that are made mostly from water, such as gelatin and soups. ? Drink from small cups to help control how much you drink.  Take vitamin and mineral supplements only as told by your health care provider.  Take over-the-counter and prescription medicines only as told by your health care provider. ? Your health care provider may recommend an over-the-counter medicine that binds phosphorus, such as an antacid medicine that contains calcium carbonate. What foods can I eat? Fruits Apples. Fresh or frozen berries. Fresh or canned pears, peaches, and pineapple. Grapes. Plums. Vegetables Fresh or  frozen broccoli, carrots, and green beans. Cabbage. Cauliflower. Celery. Cucumbers. Eggplant. Radishes. Zucchini. Grains White bread. White rice. Cooked cereal. Unsalted popcorn. Tortillas. Pasta. Meats and other proteins Fresh or frozen beef, pork, chicken, and fish. Eggs. Dairy Cream cheese. Heavy cream. Ricotta cheese. Beverages Apple cider. Cranberry juice. Grape juice. Lemonade. Black  coffee. Rice milk (that is not enriched or fortified). Condiments Herbs. Spices. Jam and jelly. Honey. Sweets and desserts Sherbet. Cakes. Cookies. Fats and oils Olive oil, canola oil, and safflower oil. Other Non-dairy creamer. Non-dairy whipped topping. Homemade broth without salt. The items listed above may not be a complete list of foods and beverages you can eat. Contact your dietitian for more options. What foods are not recommended? Fruits Star fruit. Bananas. Oranges. Kiwi. Nectarines. Prunes. Melon. Dried fruit. Avocado. Vegetables Potatoes. Beets. Tomatoes. Winter squash and pumpkin. Asparagus. Spinach. Parsnips. Grains Whole-grain bread. Whole-grain pasta. High-fiber cereal. Meats and other proteins Canned, smoked, and cured meats. Soil scientist. Sardines. Nuts and seeds. Peanut butter. Beans and legumes. Dairy Milk. Buttermilk. Yogurt. Cheese and cottage cheese. Processed cheese spreads. Beverages Orange juice. Prune juice. Carbonated soft drinks. Condiments Salt. Salt substitutes. Soy sauce. Sweets and desserts Ice cream. Chocolate. Candied nuts. Fats and oils Butter. Margarine. Other Ready-made frozen meals. Canned soups. The items listed above may not be a complete list of foods and beverages you should avoid. Contact your dietitian for more information. Summary  Dialysis is a procedure that is done when the kidneys have stopped working properly (kidney failure).  If you are undergoing dialysis, it is important to pay careful attention to what you eat. Between dialysis sessions, certain nutrients and wastes can build up in your blood and cause you to get sick.  Your dietitian will help you design an eating plan that is specific to your needs.  Avoid foods that are high in sodium and phosphorus. Restrict fluids as told by your health care provider or dietitian. This information is not intended to replace advice given to you by your health care provider.  Make sure you discuss any questions you have with your health care provider. Document Released: 01/16/2004 Document Revised: 07/07/2017 Document Reviewed: 04/21/2017 Elsevier Interactive Patient Education  2019 [employer].

## 2018-10-11 NOTE — TOC Transition Note (Signed)
Transition of Care [facility]) - CM/SW Discharge Note   Patient Details  Name: Paul [name]. MRN: [id number] Date of Birth: 06-Sep-1965  Transition of Care [facility]) CM/SW Contact:  Annamaria Boots, [place] Phone Number: 10/11/2018, 1:32 PM   Clinical Narrative:   Patient is medically ready for discharge today. Patient will discharge home with home health through [facility]. CSW notified Corene Cornea with [facility] of discharge today. Patient is also a new dialysis patient and has an outpatient chair time and is aware. No further needs at this time.     Final next level of care: Home w Home Health Services Barriers to Discharge: No Barriers Identified   Patient Goals and CMS Choice   CMS Medicare.gov Compare Post Acute Care list provided to:: Patient Choice offered to / list presented to : Patient  Discharge Placement                    Patient and family notified of of transfer: 10/11/18  Discharge Plan and Services     Post Acute Care Choice: [place]: RN [facility] Agency: [place] ([place]) Date [facility] Agency Contacted: 10/11/18   Representative spoke with at [place]: [place] ([place]) Interventions     Readmission Risk Interventions Readmission Risk Prevention Plan 10/06/2018 09/09/2018 08/02/2018  Transportation Screening Complete Complete Complete  PCP or Specialist Appt within 3-5 Days Complete Complete -  HRI or [place] Complete Complete Complete  Social Work Consult for [place] Planning/Counseling Complete Not Complete Complete  SW consult not completed comments - NA -  Palliative Care Screening Not Applicable Not Applicable Not Applicable  Medication Review Press photographer) - - Complete  Some recent data might be hidden

## 2018-10-11 NOTE — Progress Notes (Signed)
[facility], [place] 10/11/18  Subjective:  Late entry. Patient seen prior to discharge.  Podiatry also saw patient during my interview. Had HD yesterday.    Objective:  Vital signs in last 24 hours:  Temp:  [98.3 F (36.8 C)-98.6 F (37 C)] 98.6 F (37 C) (06/09 0450) Pulse Rate:  [80-97] 84 (06/09 0450) Resp:  [12-19] 18 (06/09 0450) BP: (125-157)/(51-94) 125/80 (06/09 0450) SpO2:  [99 %-100 %] 99 % (06/09 0450) Weight:  [103.6 kg] 103.6 kg (06/08 1703)  Weight change:  Filed Weights   10/08/18 2341 10/10/18 1351 10/10/18 1703  Weight: 135.7 kg 105.1 kg 103.6 kg    Intake/Output:    Intake/Output Summary (Last 24 hours) at 10/11/2018 1557 Last data filed at 10/11/2018 1351 Gross per 24 hour  Intake 116.98 ml  Output 1500 ml  Net -1383.02 ml     Physical Exam: General:  No acute distress, lying in the bed  HEENT  moist oral mucous membranes  Neck  supple  Pulm/lungs  Normal breathing effort, clear to auscultation  CVS/Heart  regular, no rub  Abdomen:   Soft, nontender  Extremities:  1+ edema  Neurologic:  Alert, oriented, small muscle atrophy noted in hands  Skin:  Wound right heel visualized  Access:  rt IJ PC       Basic Metabolic Panel:  Recent Labs  Lab 10/05/18 0439 10/06/18 0715 10/07/18 0347 10/08/18 0557 10/10/18 1435  NA 137 137 137 138 136  K 4.6 5.2* 5.8* 5.7* 5.1  CL 105 106 109 108 106  CO2 20* 18* 17* 19* 20*  GLUCOSE 74 78 111* 156* 131*  BUN 101* 97* 100* 81* 63*  CREATININE 10.77* 10.68* 10.43* 8.67* 7.54*  CALCIUM 7.7* 7.7* 7.5* 7.9* 8.0*     CBC: Recent Labs  Lab 10/05/18 0439 10/06/18 0715 10/07/18 0347 10/08/18 0557 10/11/18 0456  WBC 9.2 9.2 8.0 8.7 8.9  HGB 7.6* 7.6* 7.1* 8.4* 7.8*  HCT 24.6* 24.7* 23.2* 27.9* 25.7*  MCV 87.5 87.6 88.5 87.7 88.6  PLT 196 201 193 217 202      Lab Results  Component Value Date   HEPBSAG Negative 10/07/2018   HEPBSAB Non Reactive 10/07/2018   HEPBIGM Negative 01/06/2017      Microbiology:  Recent Results (from the past 240 hour(s))  CULTURE, BLOOD (ROUTINE X 2) w Reflex to ID Panel     Status: None   Collection Time: 10/05/18 12:59 PM  Result Value Ref Range Status   Specimen Description BLOOD BLOOD RIGHT ARM  Final   Special Requests   Final    BOTTLES DRAWN AEROBIC AND ANAEROBIC Blood Culture adequate volume   Culture   Final    NO GROWTH 5 DAYS Performed at [facility], [address] [place], [place] [address]    Report Status 10/10/2018 FINAL  Final  CULTURE, BLOOD (ROUTINE X 2) w Reflex to ID Panel     Status: None   Collection Time: 10/05/18  2:20 PM  Result Value Ref Range Status   Specimen Description BLOOD LEFT ANTECUBITAL  Final   Special Requests   Final    BOTTLES DRAWN AEROBIC AND ANAEROBIC Blood Culture adequate volume   Culture   Final    NO GROWTH 5 DAYS Performed at [facility], [address], [place], [place] [address]    Report Status 10/10/2018 FINAL  Final  C difficile quick scan w PCR reflex     Status: None  Collection Time: 10/06/18  4:51 PM  Result Value Ref Range Status   C Diff antigen NEGATIVE NEGATIVE Final   C Diff toxin NEGATIVE NEGATIVE Final   C Diff interpretation No C. difficile detected.  Final    Comment: Performed at [facility], [address] [place], [place] [address]  Gastrointestinal Panel by PCR , Stool     Status: None   Collection Time: 10/06/18  8:22 PM  Result Value Ref Range Status   Campylobacter species NOT DETECTED NOT DETECTED Final   Plesimonas shigelloides NOT DETECTED NOT DETECTED Final   Salmonella species NOT DETECTED NOT DETECTED Final   Yersinia enterocolitica NOT DETECTED NOT DETECTED Final   Vibrio species NOT DETECTED NOT DETECTED Final   Vibrio cholerae NOT DETECTED NOT DETECTED Final   Enteroaggregative E coli (EAEC) NOT DETECTED NOT DETECTED Final   Enteropathogenic E coli (EPEC) NOT DETECTED NOT DETECTED  Final   Enterotoxigenic E coli (ETEC) NOT DETECTED NOT DETECTED Final   Shiga like toxin producing E coli (STEC) NOT DETECTED NOT DETECTED Final   Shigella/Enteroinvasive E coli (EIEC) NOT DETECTED NOT DETECTED Final   Cryptosporidium NOT DETECTED NOT DETECTED Final   Cyclospora cayetanensis NOT DETECTED NOT DETECTED Final   Entamoeba histolytica NOT DETECTED NOT DETECTED Final   Giardia lamblia NOT DETECTED NOT DETECTED Final   Adenovirus F40/41 NOT DETECTED NOT DETECTED Final   Astrovirus NOT DETECTED NOT DETECTED Final   Norovirus GI/GII NOT DETECTED NOT DETECTED Final   Rotavirus A NOT DETECTED NOT DETECTED Final   Sapovirus (I, II, IV, and V) NOT DETECTED NOT DETECTED Final    Comment: Performed at [facility], [address] [place], [place] [address]  Surgical PCR screen     Status: Abnormal   Collection Time: 10/07/18  8:25 AM  Result Value Ref Range Status   MRSA, PCR POSITIVE (A) NEGATIVE Final    Comment: RESULT CALLED TO, READ BACK BY AND VERIFIED WITH:  Lucious Groves AT [id number] 10/07/2018 SDR    Staphylococcus aureus POSITIVE (A) NEGATIVE Final    Comment: (NOTE) The Xpert SA Assay (FDA approved for NASAL specimens in patients 29 years of age and older), is one component of a comprehensive surveillance program. It is not intended to diagnose infection nor to guide or monitor treatment. Performed at [facility], [address] [place], [place] [address]     Coagulation Studies: No results for input(s): LABPROT, INR in the last 72 hours.  Urinalysis: No results for input(s): COLORURINE, LABSPEC, PHURINE, GLUCOSEU, HGBUR, BILIRUBINUR, KETONESUR, PROTEINUR, UROBILINOGEN, NITRITE, LEUKOCYTESUR in the last 72 hours.  Invalid input(s): APPERANCEUR    Imaging: Dg Chest 2 View  Result Date: 10/11/2018 CLINICAL DATA:  Predialysis chest x-ray. EXAM: CHEST - 2 VIEW COMPARISON:  05/25/2018 FINDINGS: The heart is enlarged but stable. Stable surgical  changes from bypass surgery. Stable pacer wires/AICD. Right IJ dialysis catheter is new since the prior study. The tips are in the mid distal SVC. Streaky left basilar density, likely atelectasis. No pulmonary edema or pleural effusions. IMPRESSION: 1. Right IJ dialysis catheter tips are in the mid distal SVC. 2. Stable cardiac enlargement. 3. Streaky left basilar density, likely atelectasis. No pulmonary edema or pleural effusions. Electronically Signed   By: Marijo Sanes M.D.   On: 10/11/2018 10:32     Medications:   . sodium chloride Stopped (10/10/18 2131)  . cefTAZidime (FORTAZ)  IV Stopped (10/10/18 2130)   . allopurinol  100 mg Oral  Daily  . amiodarone  200 mg Oral Daily  . aspirin EC  81 mg Oral Daily  . carvedilol  3.125 mg Oral BID  . Chlorhexidine Gluconate Cloth  6 each Topical Q0600  . cholecalciferol  5,000 Units Oral Weekly  . clopidogrel  75 mg Oral Daily  . dicyclomine  10 mg Oral TID AC & HS  . epoetin (EPOGEN/PROCRIT) injection  4,000 Units Intravenous Q M,W,F-HD  . gabapentin  100 mg Oral TID  . heparin  5,000 Units Subcutaneous Q8H  . hydrocerin   Topical Daily  . insulin aspart  0-5 Units Subcutaneous QHS  . insulin aspart  0-9 Units Subcutaneous TID WC  . metroNIDAZOLE  500 mg Oral BID  . mupirocin ointment  1 application Nasal BID  . predniSONE  5 mg Oral Q breakfast   sodium chloride, acetaminophen **OR** acetaminophen, HYDROmorphone (DILAUDID) injection, loperamide, ondansetron **OR** ondansetron (ZOFRAN) IV, ondansetron (ZOFRAN) IV, oxyCODONE, polyethylene glycol  Assessment/ Plan:  53 y.o. African-American male with type 2 diabetes, diabetic neuropathy, hypertension, coronary disease, congestive heart failure with LVEF of 25 to 30% in January 6195, grade 1 diastolic dysfunction, ICD, history of CABG in 2010, carotid stenosis, stroke, obstructive sleep apnea, peripheral vascular disease, gout, osteomyelitis of heel  1.  Acute kidney injury on chronic kidney  disease stage IV Renal ultrasound is negative for obstruction, shows mild increased echogenicity.  Urinalysis shows mild proteinuria.  0-5 RBCs and WBCs. Differential diagnosis is vast but most likely patient has severe ATN from drug toxicity such as vancomycin in the setting of volume depletion from torsemide.  Patient had HD yesterday, tolerated well, next HD on Thursday as outpt.  There is some potential for recovery which we will look for as an outpt.   2.  Right heel osteomyelitis.  Status post debridement on 5/7 by Dr. Elvina Mattes and angiogram on 5/8 by Dr. Delana Meyer. IV antibiotics as per ID team  3.  Diabetes type 2 with CKD Insulin-dependent Complications include diabetic foot ulcer and osteomyelitis, diabetes management per hospitalist. Lab Results  Component Value Date   HGBA1C 7.9 (H) 05/25/2018     4.  Anemia of chronic kidney disease Lab Results  Component Value Date   HGB 7.8 (L) 10/11/2018  Pt will likely need epogen as outpt during HD which we will arrange for.    LOS: 7 Polina Burmaster 6/9/20203:57 PM  [place], [place]  Note: This note was prepared with Dragon dictation. Any transcription errors are unintentional.

## 2018-10-11 NOTE — Consult Note (Addendum)
Pharmacy Antibiotic Note  Paul [name]. is a 53 y.o. male admitted on 10/04/2018 with Osteomyelitis.  Pharmacy has been consulted for Ceftazidime dosing. Pt on HD.   Plan: Will order ceftazidime 1 g q24H. Administer after hemodialysis on dialysis days.   (Further discussion with Dr Leslye Peer regarding discharge plan - on discharge pt will be receiving Ceftazidime 2g per HD session at outpatient dialyses)   Height: 5\' 10"  (177.8 cm) Weight: 228 lb 6.3 oz (103.6 kg) IBW/kg (Calculated) : 73  Temp (24hrs), Avg:98.4 F (36.9 C), Min:98.1 F (36.7 C), Max:98.6 F (37 C)  Recent Labs  Lab 10/05/18 0439 10/06/18 0715 10/07/18 0347 10/08/18 0557 10/10/18 1435 10/11/18 0456  WBC 9.2 9.2 8.0 8.7  --  8.9  CREATININE 10.77* 10.68* 10.43* 8.67* 7.54*  --     Estimated Creatinine Clearance: 13.7 mL/min (A) (by C-G formula based on SCr of 7.54 mg/dL (H)).    Allergies  Allergen Reactions  . Other Other (See Comments)    Cardiac Problems. Pt states he tolerates Toradol. Due to kidney and heart problems per pt  . Ibuprofen Other (See Comments)    Heart problems  . Baclofen Other (See Comments)  . Metformin Diarrhea  . Nsaids     Due to kidney and heart problems per pt    Antimicrobials this admission: 6/2 daptomycin >>  6/2 metronidazole >>  6/8 ceftazidime >>   Dose adjustments this admission: None  Microbiology results: 5/7 Tissue Cx:  METHICILLIN RESISTANT STAPHYLOCOCCUS AUREUS  FEW PREVOTELLA BIVIA  BETA LACTAMASE POSITIVE  FEW ENTEROCOCCUS FAECALIS  6/3 BCx: NGTD  6/5 MRSA PCR: positive   Thank you for allowing pharmacy to be a part of this patient's care.  Lu Duffel, PharmD, BCPS 10/11/2018 9:14 AM

## 2018-10-11 NOTE — Discharge Summary (Signed)
Paul [name] at [place] NAME: Paul [name]    MR#:  [id number]  DATE OF BIRTH:  June 30, 1965  DATE OF ADMISSION:  10/04/2018 ADMITTING PHYSICIAN: Sela Hua, MD  DATE OF DISCHARGE: 10/11/2018  2:55 PM  PRIMARY CARE PHYSICIAN: Tracie Harrier, MD    ADMISSION DIAGNOSIS:  Acute Kidney Injury  DISCHARGE DIAGNOSIS:  Active Problems:   AKI (acute kidney injury) ([place])   SECONDARY DIAGNOSIS:   Past Medical History:  Diagnosis Date  . Anemia   . Cardiac defibrillator in place    a. Biotronik LUmax 540 DRT, (ser # [id number]).  . Carotid arterial disease ([place])    a. s/p prior LICA stenting;  b. 11/7412 Carotid U/S: 40-59% bilat ICA stenosis. Patent LICA stent.  . CHF (congestive heart failure) ([place])   . CKD (chronic kidney disease), stage III ([place])   . Coronary artery disease    a. 2010 s/p CABG x 3.  . DDD (degenerative disc disease), lumbosacral    L5-S1  . Diabetes ([place])    Lantus at bedtime  . Gangrene of toe of right foot ([place])   . Gout of left hand 10/06/2016  . HFrEF (heart failure with reduced ejection fraction) ([place])    a. 07/2016 Echo: EF 25-30%, diff HK, mild MR, mildly dil LA, mod reduced RV fxn, PASP 28mHg.  .Marland KitchenHypertension    takes Coreg daily  . Ischemic cardiomyopathy    a. 07/2016 Echo: EF 25-30%, diff HK.  . Myocardial infarction ([place] 2009  . Peripheral vascular disease ([place]   . Sleep apnea    sleep study yr ago-unable to afford cpap  . Stroke ([facility] 09   no weakness    HOSPITAL COURSE:   1.  Acute kidney injury that has progressed to end-stage renal disease.  PermCath was placed in the right chest.  Patient has 3 dialysis sessions in an outpatient slot.  Patient will continue on dialysis as outpatient.  Weekly labs with CBC BMP ESR and CRP can be sent off with dialysis. 2.  Hyperkalemia.  Dialysis to manage fluid. 3.  Osteomyelitis of the right calcaneus.  Patient underwent surgery for necrotic Achilles tendon on  09/08/2018.  I spoke with the infectious disease doctor yesterday and she recommended 2 more weeks of IV daptomycin and ceftazidime.  I prescribed these antibiotics and they will be faxed over to the dialysis center.  End date on these are 10/25/2018.  Home health to follow-up as outpatient for dressing changes.  Follow-up with podiatry as outpatient. 4.  Chronic systolic heart failure with anasarca.  Dialysis should help out with managing fluid.  On low-dose Coreg.  Torsemide to be given on nondialysis days.  Spironolactone contraindicated with poor renal function.  May be able to consider ACE inhibitor as outpatient.  Not prescribed as inpatient because of worsening kidney function. 5.  Chronic atrial fibrillation.  Taken off anticoagulation.  On aspirin and Plavix at this point.  On oral amiodarone. 6.  Type 2 diabetes all sugars have been good.  Off medications. 7.  Diabetic neuropathy on low-dose gabapentin.  Has to be renally dosed. 8.  Chronic gout on low-dose prednisone and allopurinol 9.  Diarrhea after eating.  Improved with Bentyl.  PRN Imodium.  Stool studies were sent off and are negative.  DISCHARGE CONDITIONS:   Satisfactory  CONSULTS OBTAINED:  Treatment Team:  RTsosie Billing MD  DRUG ALLERGIES:   Allergies  Allergen Reactions  . Other Other (See  Comments)    Cardiac Problems. Pt states he tolerates Toradol. Due to kidney and heart problems per pt  . Ibuprofen Other (See Comments)    Heart problems  . Baclofen Other (See Comments)  . Metformin Diarrhea  . Nsaids     Due to kidney and heart problems per pt    DISCHARGE MEDICATIONS:   Allergies as of 10/11/2018      Reactions   Other Other (See Comments)   Cardiac Problems. Pt states he tolerates Toradol. Due to kidney and heart problems per pt   Ibuprofen Other (See Comments)   Heart problems   Baclofen Other (See Comments)   Metformin Diarrhea   Nsaids    Due to kidney and heart problems per pt       Medication List    STOP taking these medications   cefTRIAXone  IVPB Commonly known as:  ROCEPHIN   daptomycin  IVPB Commonly known as:  CUBICIN Replaced by:  DAPTOmycin 500 MG injection   insulin glargine 100 unit/mL Sopn Commonly known as:  LANTUS   metroNIDAZOLE 500 MG tablet Commonly known as:  Flagyl   oxyCODONE 5 MG immediate release tablet Commonly known as:  Oxy IR/ROXICODONE     TAKE these medications   acetaminophen 325 MG tablet Commonly known as:  TYLENOL Take 2 tablets (650 mg total) by mouth every 6 (six) hours as needed for mild pain (or Fever >/= 101). What changed:  Another medication with the same name was removed. Continue taking this medication, and follow the directions you see here.   allopurinol 100 MG tablet Commonly known as:  ZYLOPRIM Take 1 tablet (100 mg total) by mouth daily.   amiodarone 200 MG tablet Commonly known as:  PACERONE Take 200 mg by mouth daily. Take 200 mg twice a day for ten days, then take 200 mg once a day thereafter.   aspirin EC 81 MG tablet Take 1 tablet (81 mg total) by mouth daily.   carvedilol 3.125 MG tablet Commonly known as:  COREG Take 1 tablet (3.125 mg total) by mouth 2 (two) times daily.   cefTAZidime in dextrose 5 % 50 mL 2 gm iv with dialysis for six more doses (last dose 10/25/2018   clopidogrel 75 MG tablet Commonly known as:  PLAVIX Take 1 tablet (75 mg total) by mouth daily with breakfast.   DAPTOmycin 500 MG injection Commonly known as:  CUBICIN 747m iv with each dialysis session for six doses (last dose 10/25/2018) Replaces:  daptomycin  IVPB   dicyclomine 10 MG capsule Commonly known as:  BENTYL Take 1 capsule (10 mg total) by mouth 4 (four) times daily -  before meals and at bedtime.   gabapentin 100 MG capsule Commonly known as:  NEURONTIN Take 1 capsule (100 mg total) by mouth 3 (three) times daily. What changed:  how much to take   hydrocerin Crea 1 application daily to bilateral  heels   HYDROcodone-acetaminophen 5-325 MG tablet Commonly known as:  NORCO/VICODIN Take 1 tablet by mouth 2 (two) times a day.   loperamide 2 MG capsule Commonly known as:  IMODIUM Take 1 capsule (2 mg total) by mouth every 8 (eight) hours as needed for diarrhea or loose stools.   predniSONE 5 MG tablet Commonly known as:  DELTASONE Take 1 tablet (5 mg total) by mouth daily with breakfast.   torsemide 20 MG tablet Commonly known as:  DEMADEX Take 40 mg (two tablets) on days that you do not have  dialysis What changed:    how much to take  how to take this  when to take this  additional instructions   Vitamin D3 1.25 MG (50000 UT) Caps Take 1 capsule by mouth once a week.        DISCHARGE INSTRUCTIONS:   Follow-up PMD 5 days Follow-up dialysis 3 times a week Follow-up podiatry 1 week  If you experience worsening of your admission symptoms, develop shortness of breath, life threatening emergency, suicidal or homicidal thoughts you must seek medical attention immediately by calling 911 or calling your MD immediately  if symptoms less severe.  You Must read complete instructions/literature along with all the possible adverse reactions/side effects for all the Medicines you take and that have been prescribed to you. Take any new Medicines after you have completely understood and accept all the possible adverse reactions/side effects.   Please note  You were cared for by a hospitalist during your hospital stay. If you have any questions about your discharge medications or the care you received while you were in the hospital after you are discharged, you can call the unit and asked to speak with the hospitalist on call if the hospitalist that took care of you is not available. Once you are discharged, your primary care physician will handle any further medical issues. Please note that NO REFILLS for any discharge medications will be authorized once you are discharged, as it is  imperative that you return to your primary care physician (or establish a relationship with a primary care physician if you do not have one) for your aftercare needs so that they can reassess your need for medications and monitor your lab values.    Today   CHIEF COMPLAINT:  No chief complaint on file.   HISTORY OF PRESENT ILLNESS:  Paul [name]  is a 53 y.o. male sent in with worsening renal function   VITAL SIGNS:  Blood pressure 125/80, pulse 84, temperature 98.6 F (37 C), temperature source Oral, resp. rate 18, height 5' 10"  (1.778 m), weight 103.6 kg, SpO2 99 %.    PHYSICAL EXAMINATION:  GENERAL:  53 y.o.-year-old patient lying in the bed with no acute distress.  EYES: Pupils equal, round, reactive to light and accommodation. No scleral icterus. Extraocular muscles intact.  HEENT: Head atraumatic, normocephalic. Oropharynx and nasopharynx clear.  NECK:  Supple, no jugular venous distention. No thyroid enlargement, no tenderness.  LUNGS: Normal breath sounds bilaterally, no wheezing, rales,rhonchi or crepitation. No use of accessory muscles of respiration.  CARDIOVASCULAR: S1, S2 normal. No murmurs, rubs, or gallops.  ABDOMEN: Soft, non-tender, non-distended. Bowel sounds present. No organomegaly or mass.  EXTREMITIES: 2+ pedal edema, cyanosis, or clubbing.  NEUROLOGIC: Cranial nerves II through XII are intact. Muscle strength 5/5 in all extremities. Sensation intact. Gait not checked.  PSYCHIATRIC: The patient is alert and oriented x 3.  SKIN:     DATA REVIEW:   CBC Recent Labs  Lab 10/11/18 0456  WBC 8.9  HGB 7.8*  HCT 25.7*  PLT 202    Chemistries  Recent Labs  Lab 10/10/18 1435  NA 136  K 5.1  CL 106  CO2 20*  GLUCOSE 131*  BUN 63*  CREATININE 7.54*  CALCIUM 8.0*     Microbiology Results  Results for orders placed or performed during the hospital encounter of 10/04/18  CULTURE, BLOOD (ROUTINE X 2) w Reflex to ID Panel     Status: None    Collection Time: 10/05/18 12:59 PM  Result Value Ref Range Status   Specimen Description BLOOD BLOOD RIGHT ARM  Final   Special Requests   Final    BOTTLES DRAWN AEROBIC AND ANAEROBIC Blood Culture adequate volume   Culture   Final    NO GROWTH 5 DAYS Performed at [facility], [address] [place], [place] [address]    Report Status 10/10/2018 FINAL  Final  CULTURE, BLOOD (ROUTINE X 2) w Reflex to ID Panel     Status: None   Collection Time: 10/05/18  2:20 PM  Result Value Ref Range Status   Specimen Description BLOOD LEFT ANTECUBITAL  Final   Special Requests   Final    BOTTLES DRAWN AEROBIC AND ANAEROBIC Blood Culture adequate volume   Culture   Final    NO GROWTH 5 DAYS Performed at [facility], [address], [place], [place] [address]    Report Status 10/10/2018 FINAL  Final  C difficile quick scan w PCR reflex     Status: None   Collection Time: 10/06/18  4:51 PM  Result Value Ref Range Status   C Diff antigen NEGATIVE NEGATIVE Final   C Diff toxin NEGATIVE NEGATIVE Final   C Diff interpretation No C. difficile detected.  Final    Comment: Performed at [facility], [address] [place], [place] [address]  Gastrointestinal Panel by PCR , Stool     Status: None   Collection Time: 10/06/18  8:22 PM  Result Value Ref Range Status   Campylobacter species NOT DETECTED NOT DETECTED Final   Plesimonas shigelloides NOT DETECTED NOT DETECTED Final   Salmonella species NOT DETECTED NOT DETECTED Final   Yersinia enterocolitica NOT DETECTED NOT DETECTED Final   Vibrio species NOT DETECTED NOT DETECTED Final   Vibrio cholerae NOT DETECTED NOT DETECTED Final   Enteroaggregative E coli (EAEC) NOT DETECTED NOT DETECTED Final   Enteropathogenic E coli (EPEC) NOT DETECTED NOT DETECTED Final   Enterotoxigenic E coli (ETEC) NOT DETECTED NOT DETECTED Final   Shiga like toxin producing E coli (STEC) NOT DETECTED NOT DETECTED Final    Shigella/Enteroinvasive E coli (EIEC) NOT DETECTED NOT DETECTED Final   Cryptosporidium NOT DETECTED NOT DETECTED Final   Cyclospora cayetanensis NOT DETECTED NOT DETECTED Final   Entamoeba histolytica NOT DETECTED NOT DETECTED Final   Giardia lamblia NOT DETECTED NOT DETECTED Final   Adenovirus F40/41 NOT DETECTED NOT DETECTED Final   Astrovirus NOT DETECTED NOT DETECTED Final   Norovirus GI/GII NOT DETECTED NOT DETECTED Final   Rotavirus A NOT DETECTED NOT DETECTED Final   Sapovirus (I, II, IV, and V) NOT DETECTED NOT DETECTED Final    Comment: Performed at [facility], [address] [place], [place] [address]  Surgical PCR screen     Status: Abnormal   Collection Time: 10/07/18  8:25 AM  Result Value Ref Range Status   MRSA, PCR POSITIVE (A) NEGATIVE Final    Comment: RESULT CALLED TO, READ BACK BY AND VERIFIED WITH:  Lucious Groves AT 4818 10/07/2018 SDR    Staphylococcus aureus POSITIVE (A) NEGATIVE Final    Comment: (NOTE) The Xpert SA Assay (FDA approved for NASAL specimens in patients 74 years of age and older), is one component of a comprehensive surveillance program. It is not intended to diagnose infection nor to guide or monitor treatment. Performed at [facility], [address], [place], [place] [address]     RADIOLOGY:  Dg Chest 2 View  Result Date:  10/11/2018 CLINICAL DATA:  Predialysis chest x-ray. EXAM: CHEST - 2 VIEW COMPARISON:  05/25/2018 FINDINGS: The heart is enlarged but stable. Stable surgical changes from bypass surgery. Stable pacer wires/AICD. Right IJ dialysis catheter is new since the prior study. The tips are in the mid distal SVC. Streaky left basilar density, likely atelectasis. No pulmonary edema or pleural effusions. IMPRESSION: 1. Right IJ dialysis catheter tips are in the mid distal SVC. 2. Stable cardiac enlargement. 3. Streaky left basilar density, likely atelectasis. No pulmonary edema or pleural effusions. Electronically  Signed   By: Marijo Sanes M.D.   On: 10/11/2018 10:32      Management plans discussed with the patient, and he is in agreement.  CODE STATUS:     Code Status Orders  (From admission, onward)         Start     Ordered   10/04/18 1959  Full code  Continuous     10/04/18 1958        Code Status History    Date Active Date Inactive Code Status Order ID Comments User Context   09/06/2018 0259 09/12/2018 2017 Full Code [id number]  Lance Coon, MD Inpatient   07/25/2018 1717 08/02/2018 1919 Full Code [id number]  Dustin Flock, MD Inpatient   05/25/2018 0352 05/29/2018 2022 Full Code [id number]  Hillary Bow, MD ED   10/09/2016 1003 10/09/2016 1340 Full Code [id number]  Samara Deist, DPM Inpatient   08/22/2016 0042 08/27/2016 1649 Full Code [id number]  Lance Coon, MD Inpatient   07/18/2014 1818 07/20/2014 2002 Full Code [id number]  Leighton Parody, PA-C Inpatient      TOTAL TIME TAKING CARE OF THIS PATIENT: 35 minutes.    Loletha Grayer M.D on 10/11/2018 at 3:20 PM  Between 7am to 6pm - Pager - [phone]  After 6pm go to [email or web address] - password [email or web address]  [employer] Office  [phone]  CC: Primary care physician; Tracie Harrier, MD

## 2018-10-11 NOTE — Progress Notes (Signed)
F/u heel ulcer.  Left heel superficial ulcer.  Right heel ulcer is stable.  Mixed fibrotic granular tissue in the central aspect.  The proximal incision is coapting very nicely.      Dressing changes: Left heel can be changed 3 times a week with an allevyn pressure reducing dressing.  Cleanse wound with dressing changes with saline.  Continue with current right heel dressing changes that have been performed outpatient prior to hospitalization.  Patient should follow-up with Dr. Elvina Mattes next week.

## 2018-10-12 ENCOUNTER — Other Ambulatory Visit: Payer: Self-pay

## 2018-10-12 DIAGNOSIS — N179 Acute kidney failure, unspecified: Secondary | ICD-10-CM

## 2018-10-12 DIAGNOSIS — E1169 Type 2 diabetes mellitus with other specified complication: Secondary | ICD-10-CM | POA: Diagnosis not present

## 2018-10-12 DIAGNOSIS — L89623 Pressure ulcer of left heel, stage 3: Secondary | ICD-10-CM | POA: Diagnosis not present

## 2018-10-12 DIAGNOSIS — M86171 Other acute osteomyelitis, right ankle and foot: Secondary | ICD-10-CM | POA: Diagnosis not present

## 2018-10-12 DIAGNOSIS — L89613 Pressure ulcer of right heel, stage 3: Secondary | ICD-10-CM | POA: Diagnosis not present

## 2018-10-12 DIAGNOSIS — B964 Proteus (mirabilis) (morganii) as the cause of diseases classified elsewhere: Secondary | ICD-10-CM | POA: Diagnosis not present

## 2018-10-12 DIAGNOSIS — E1151 Type 2 diabetes mellitus with diabetic peripheral angiopathy without gangrene: Secondary | ICD-10-CM | POA: Diagnosis not present

## 2018-10-13 ENCOUNTER — Other Ambulatory Visit: Payer: Self-pay | Admitting: *Deleted

## 2018-10-13 DIAGNOSIS — N2581 Secondary hyperparathyroidism of renal origin: Secondary | ICD-10-CM | POA: Diagnosis not present

## 2018-10-13 DIAGNOSIS — Z992 Dependence on renal dialysis: Secondary | ICD-10-CM | POA: Diagnosis not present

## 2018-10-13 DIAGNOSIS — N186 End stage renal disease: Secondary | ICD-10-CM | POA: Diagnosis not present

## 2018-10-13 DIAGNOSIS — E559 Vitamin D deficiency, unspecified: Secondary | ICD-10-CM | POA: Diagnosis not present

## 2018-10-13 DIAGNOSIS — B9689 Other specified bacterial agents as the cause of diseases classified elsewhere: Secondary | ICD-10-CM | POA: Diagnosis not present

## 2018-10-13 DIAGNOSIS — N179 Acute kidney failure, unspecified: Secondary | ICD-10-CM | POA: Diagnosis not present

## 2018-10-13 DIAGNOSIS — N189 Chronic kidney disease, unspecified: Secondary | ICD-10-CM | POA: Diagnosis not present

## 2018-10-13 DIAGNOSIS — Z1159 Encounter for screening for other viral diseases: Secondary | ICD-10-CM | POA: Diagnosis not present

## 2018-10-13 DIAGNOSIS — E119 Type 2 diabetes mellitus without complications: Secondary | ICD-10-CM | POA: Diagnosis not present

## 2018-10-13 DIAGNOSIS — D649 Anemia, unspecified: Secondary | ICD-10-CM | POA: Diagnosis not present

## 2018-10-13 DIAGNOSIS — N184 Chronic kidney disease, stage 4 (severe): Secondary | ICD-10-CM | POA: Diagnosis not present

## 2018-10-13 DIAGNOSIS — M86171 Other acute osteomyelitis, right ankle and foot: Secondary | ICD-10-CM | POA: Diagnosis not present

## 2018-10-13 DIAGNOSIS — D509 Iron deficiency anemia, unspecified: Secondary | ICD-10-CM | POA: Diagnosis not present

## 2018-10-13 NOTE — Patient Outreach (Signed)
[name] Care Management  10/13/2018  Paul Ala. 06/13/1965 [id number]   Covering for assigned care manager, L. Tousey, referral received from hospital liaison as member was recently admitted to hospital with renal disease from 6/2-6/9.  Now on dialysis treatments.  Primary MD office will complete transition of care assessment.  Per chart, he also has history of HTN, CAD, CHF, Diabetes (controlled, A1C - 7.9), and osteomyelitis.  He also recently had surgery on right foot for wound infection.    Call placed to member for assessment, identity verified.  This care manager introduced self and stated purpose of call.  [facility] care management services explained.  Report he is currently at dialysis, first outpatient treatment.  Treatment days will be Tuesdays, Thursdays & Saturdays.  Inquired about need for transportation, he denies.  Report he lives with a family friend ([name]) who is available to help him when needed, however state he is independent with ADLs/IADLs.    He is active with [facility] in [place], state he has follow up appointment with primary MD on Monday.  Also state he has appointment with podiatrist the same day.  Report his wounds are healing well but he continues to receive IV antibiotic therapy on dialysis days.  He confirms he has home health involvement, initial assessment/visit was yesterday.    This care manager inquired about understanding/questions regarding dialysis treatments.  Verbalize understanding of process and denies questions.  Also report understanding of renal diet.  He does report the need for a surgery on his neck.  State he was told he has a bone pressing into his spine that is causing issues with the use of his hands. State he was cleared by his primary MD as well as his cardiologist prior to this most recent hospitalization.  He is unsure if he will be able to proceed with the surgery as planned.  He will discuss with nephrologist  as well as asking his PCP and cardiologist again to make sure he is still cleared.  State he was told the longer he wait the worse his symptoms will be.    He denies any urgent concerns at this time.  Made aware that assigned care manager will follow up with him within the next 2 weeks.  Will send member EMMI education regarding ESRD, dialysis, and diet.  Advised to contact this care manager with questions.  THN CM Care Plan Problem One     Most Recent Value  Care Plan Problem One  Risk for hospital readmission related to complications of ESRD as evidenced by recent hospitalization and new dialysis treatments  Role Documenting the Problem One  Care Management [place] for Problem One  Active  THN Long Term Goal   Member will not be readmitted to hospital within the next 31 days  THN Long Term Goal Start Date  10/13/18  Interventions for Problem One Long Term Goal  Discharge instructions reviewed with member, including adherence to plan of care, home health invovlement, dialysis treatments, and medication management.  Advised importance of following plan in effort to decrease risk of readmission.  EMMI education regarding dialysis and ESRD sent to member.  THN CM Short Term Goal #1   Member will report compliance with renal diet over the next 4 weeks  THN CM Short Term Goal #1 Start Date  10/13/18  Interventions for Short Term Goal #1  Member educated on renal diet.  EMMI education regarding renal diet sent to  member.  THN CM Short Term Goal #2   Member will keep and attend follow up appointment with primary MD within the next week.  THN CM Short Term Goal #2 Start Date  10/13/18  Interventions for Short Term Goal #2  Upcoming appointments reviewed with member.  Assessed the need for transportation.     Paul [name], [place], MSN [place] [phone]

## 2018-10-14 DIAGNOSIS — L89613 Pressure ulcer of right heel, stage 3: Secondary | ICD-10-CM | POA: Diagnosis not present

## 2018-10-14 DIAGNOSIS — L89623 Pressure ulcer of left heel, stage 3: Secondary | ICD-10-CM | POA: Diagnosis not present

## 2018-10-14 DIAGNOSIS — B964 Proteus (mirabilis) (morganii) as the cause of diseases classified elsewhere: Secondary | ICD-10-CM | POA: Diagnosis not present

## 2018-10-14 DIAGNOSIS — M86171 Other acute osteomyelitis, right ankle and foot: Secondary | ICD-10-CM | POA: Diagnosis not present

## 2018-10-14 DIAGNOSIS — E1151 Type 2 diabetes mellitus with diabetic peripheral angiopathy without gangrene: Secondary | ICD-10-CM | POA: Diagnosis not present

## 2018-10-14 DIAGNOSIS — E1169 Type 2 diabetes mellitus with other specified complication: Secondary | ICD-10-CM | POA: Diagnosis not present

## 2018-10-15 DIAGNOSIS — D649 Anemia, unspecified: Secondary | ICD-10-CM | POA: Diagnosis not present

## 2018-10-15 DIAGNOSIS — N179 Acute kidney failure, unspecified: Secondary | ICD-10-CM | POA: Diagnosis not present

## 2018-10-15 DIAGNOSIS — D509 Iron deficiency anemia, unspecified: Secondary | ICD-10-CM | POA: Diagnosis not present

## 2018-10-15 DIAGNOSIS — N186 End stage renal disease: Secondary | ICD-10-CM | POA: Diagnosis not present

## 2018-10-15 DIAGNOSIS — E559 Vitamin D deficiency, unspecified: Secondary | ICD-10-CM | POA: Diagnosis not present

## 2018-10-15 DIAGNOSIS — M86171 Other acute osteomyelitis, right ankle and foot: Secondary | ICD-10-CM | POA: Diagnosis not present

## 2018-10-17 DIAGNOSIS — E1151 Type 2 diabetes mellitus with diabetic peripheral angiopathy without gangrene: Secondary | ICD-10-CM | POA: Diagnosis not present

## 2018-10-17 DIAGNOSIS — M86171 Other acute osteomyelitis, right ankle and foot: Secondary | ICD-10-CM | POA: Diagnosis not present

## 2018-10-17 DIAGNOSIS — B964 Proteus (mirabilis) (morganii) as the cause of diseases classified elsewhere: Secondary | ICD-10-CM | POA: Diagnosis not present

## 2018-10-17 DIAGNOSIS — E1169 Type 2 diabetes mellitus with other specified complication: Secondary | ICD-10-CM | POA: Diagnosis not present

## 2018-10-17 DIAGNOSIS — L89613 Pressure ulcer of right heel, stage 3: Secondary | ICD-10-CM | POA: Diagnosis not present

## 2018-10-17 DIAGNOSIS — L89623 Pressure ulcer of left heel, stage 3: Secondary | ICD-10-CM | POA: Diagnosis not present

## 2018-10-19 DIAGNOSIS — L89613 Pressure ulcer of right heel, stage 3: Secondary | ICD-10-CM | POA: Diagnosis not present

## 2018-10-19 DIAGNOSIS — B964 Proteus (mirabilis) (morganii) as the cause of diseases classified elsewhere: Secondary | ICD-10-CM | POA: Diagnosis not present

## 2018-10-19 DIAGNOSIS — E1151 Type 2 diabetes mellitus with diabetic peripheral angiopathy without gangrene: Secondary | ICD-10-CM | POA: Diagnosis not present

## 2018-10-19 DIAGNOSIS — L89623 Pressure ulcer of left heel, stage 3: Secondary | ICD-10-CM | POA: Diagnosis not present

## 2018-10-19 DIAGNOSIS — M86171 Other acute osteomyelitis, right ankle and foot: Secondary | ICD-10-CM | POA: Diagnosis not present

## 2018-10-19 DIAGNOSIS — E1169 Type 2 diabetes mellitus with other specified complication: Secondary | ICD-10-CM | POA: Diagnosis not present

## 2018-10-20 DIAGNOSIS — N179 Acute kidney failure, unspecified: Secondary | ICD-10-CM | POA: Diagnosis not present

## 2018-10-20 DIAGNOSIS — M86171 Other acute osteomyelitis, right ankle and foot: Secondary | ICD-10-CM | POA: Diagnosis not present

## 2018-10-20 DIAGNOSIS — E559 Vitamin D deficiency, unspecified: Secondary | ICD-10-CM | POA: Diagnosis not present

## 2018-10-20 DIAGNOSIS — N186 End stage renal disease: Secondary | ICD-10-CM | POA: Diagnosis not present

## 2018-10-20 DIAGNOSIS — D649 Anemia, unspecified: Secondary | ICD-10-CM | POA: Diagnosis not present

## 2018-10-20 DIAGNOSIS — D509 Iron deficiency anemia, unspecified: Secondary | ICD-10-CM | POA: Diagnosis not present

## 2018-10-21 ENCOUNTER — Other Ambulatory Visit: Payer: Self-pay | Admitting: *Deleted

## 2018-10-21 ENCOUNTER — Encounter: Payer: Self-pay | Admitting: *Deleted

## 2018-10-21 DIAGNOSIS — E1169 Type 2 diabetes mellitus with other specified complication: Secondary | ICD-10-CM | POA: Diagnosis not present

## 2018-10-21 DIAGNOSIS — L89613 Pressure ulcer of right heel, stage 3: Secondary | ICD-10-CM | POA: Diagnosis not present

## 2018-10-21 DIAGNOSIS — L89623 Pressure ulcer of left heel, stage 3: Secondary | ICD-10-CM | POA: Diagnosis not present

## 2018-10-21 DIAGNOSIS — E1151 Type 2 diabetes mellitus with diabetic peripheral angiopathy without gangrene: Secondary | ICD-10-CM | POA: Diagnosis not present

## 2018-10-21 DIAGNOSIS — B964 Proteus (mirabilis) (morganii) as the cause of diseases classified elsewhere: Secondary | ICD-10-CM | POA: Diagnosis not present

## 2018-10-21 DIAGNOSIS — M86171 Other acute osteomyelitis, right ankle and foot: Secondary | ICD-10-CM | POA: Diagnosis not present

## 2018-10-21 NOTE — Patient Outreach (Signed)
[place] [facility]) [place] Telephone Outreach PCP office completes Transition of Care follow up post-hospital discharge Post-hospital discharge day # 10  10/21/2018  Paul [name]. [birth year] [id number]  Successful telephone outreach attempt to Paul [name], 53 y/o male referred to [place] by [facility] Liaison RN CM after recent hospitalization June 2-9, 2020 for CKD with newly initiated hemodialysis (Tues/ Thurs/ Saturday).  Patient was discharged home to self-care with home health services in place.  Patient has history including, but not limited to, HTN, CAD, CHF, Diabetes (controlled, A1C - 7.9), and osteomyelitis.  Noted patient had rceent surgery on right foot for wound infection.   HIPAA/ identity verified with patient during phone call today; patient again provides verbal consent for Paul [name] involvement in his care.  Pleasant 25 minute phone call.  Today, patient reports that he is "doing great;" states that he had "summer cold/ allergies" earlier this week which prevented him from attending his previously scheduled post-hospital discharge PCP office visit as well as his regularly scheduled hemodialysis on Tuesday of this week; patient stated that his symptoms included "runny nose and sneezing;" stated that his symptoms "have completely" resolved now.  Discussed with patient signs/ symptoms that would indicate possible coronavirus exposure, however, patient states that he "knows" he does not have coronavirus exposure, adding that he was "tested at the hospital and it was negative;"  Discussed with patient that he could have been exposed post-hospital discharge, however, he stated that he "knows" he has not been exposed as his symptoms have now resolved and that he has been "staying at home."  Patient denies knowing signs/ symptoms that might indicate that he has been exposed and education around same was provided, along with action plan  should symptoms develop.  Patient was encouraged to promptly notify healthcare providers for any new concerns/ issues/ problems that arise, and her verbalizes agreement.  Patient denies pain and new/ recent falls today and sounds to be in no distress throughout phone call.  Patient further reports:  Medications: -- Has all medicationsand takes as prescribed;denies questions/ concerns about current medications.  -- independently self-manages medications -- denies issues with swallowing medications -- patient declines medication review today stating that he "doesn't feel like it;" discussed benefit of prompt post-hospital discharge medication review, and he agrees to this at time of next [place] outreach  Home health [facility]) services: -- confirmed [place] services for RN in place through [place]; stated that "RN just left" his home -- reports has contact information for home health team, states visits "going great," positive reinforcement provided, along with encouragement to continue actively participating in home health services  Provider appointments: -- All upcoming provider appointments were reviewed with patient today; patient confirms that he has re-scheduled missed orthopedic appointment to follow up on recent (R) foot surgery- states to attend this visit Monday 10/24/2018 at 3:00 pm; states he will transport himself to appointment -- has not yet re-scheduled missed PCP appointment, states he plans to do "soon," and patient was encouraged to re-schedule this appointment promptly; offered to provide assistance to patient in re-scheduling, however patient adamantly declines the need for this stating that he is able to schedule his own appointments- patient was again encouraged to re-schedule this appointment promptly and he assures me that he will do so  Safety/ Mobility/ Falls: -- denies new/ recent falls, however, he does report 2 falls earlier this year in January 2020,  stating  that he "doesn't know why" he fell; patient denies injuries from these falls -- assistive devices: has and uses walker as needed, due to recent foot surgery, however states that he does not use regularly; reports that he bases his decision on whether or not to use walker on how he "feels every day;" reports he uses approximately 50% of the time -- general fall risks/ prevention education discussed with patient today  Social/ Community Resource needs: -- currently denies community resource needs, stating supportive room mate that assists with care needs as indicated -- reports that he is independent in ADL's and iADL's -- reports he continues to drive himself to appointments and errands, and adds that on days he feels tired, his room mate occasionally will drive him -- denies current food insecurity, however, he does report today that he believes he should qualify for food stamps, and would like to have assistance in knowing how to apply for food stamps- discussed with patient option of my placing [facility] CSW referral to assist, and patient is agreeable to this-- encouraged patient to engage with [facility] CSW once outreach is made, and he verbalizes agreement and states he will do  Advanced Directive (AD) Planning:   --again reports does not currently have exisisting AD in place, and again declines desire for information for same  Self-health management of CKD/ newly initiated hemodialysis: -- confirms that he has attended all but one of his regularly scheduled hemodialysis sessions, due to his allergy symptoms earlier this week- reports that he has the phone number for hemodialysis center and that "the sessions are going great" -- reports that he has not yet checked his mail and has not received previously mailed printed educational material- patient states he will review once he checks his mail.  Reports that he has been following renal diet and that he has spoken with dietician at hemodialysis  center -- reports that he is tolerating hemodialysis "great," and states that he only "occasionally gets tired afterward" -- denies questions and concerns around newly initiated hemodialysis  -- states that his "feet look great;" and is able to independently verbalize signs/ symptoms infection; he denies presence of all today and stated that his feet are "healing up really good."  Patient denies further issues, concerns, or problems today.  I provided/ confirmed that patient has my direct phone number, the main [facility] CM office phone number, and the [facility] CM 24-hour nurse advice phone number should issues arise prior to next scheduled [place] outreach in 10 days for medication review; scheduled next outreach per patient preference.  Encouraged patient to contact me directly if needs, questions, issues, or concerns arise prior to next scheduled outreach; patient agreed to do so.  Plan:  Patient will take medications as prescribed and will attend all scheduled provider appointments  Patient will promptly re-schedule post-hospital discharge office visit with PCP  Patient will promptly notify care providers for any new concerns/ issues/ problems that arise  Patient will actively participate in home health services as ordered post-hospital discharge  [place] outreach to continue with scheduled phone call in 10 days for medication review  THN CM Care Plan Problem One     Most Recent Value  Care Plan Problem One  Risk for hospital readmission related to complications of ESRD as evidenced by recent hospitalization and new dialysis treatments  Role Documenting the Problem One  Care Management Coordinator  Care Plan for Problem One  Active  [facility] Long Term Goal  Member will not be readmitted to hospital within the next 31 days  THN Long Term Goal Start Date  10/13/18  Interventions for Problem One Long Term Goal  Discussed current clinical condition with patient,  discussed with patient  his recently missed HD sessions due to being sick with reported allergy symptoms,  discussed with patient and provided education around signs/ symptoms possible corona virus exposure, along with action plan for same,  encouraged patient to promptly notify care providers for any new concerns, issues, or problems that arise  [facility] CM Short Term Goal #1   Member will report compliance with renal diet over the next 4 weeks  [facility]N CM Short Term Goal #1 Start Date  10/13/18  Interventions for Short Term Goal #1  Discussed with patient his adherence to renal diet,  confirmed that patient has been working with dietician at HD center and is following dietary recommendations for renal diet,  encouraged patient to review printed educational material previously mailed to him  [facility] CM Short Term Goal #2   Over the next  10 days, patient will reschedule follow up appointment with primary MD as evidenced by patient reporting during [place] RN CM outreach [Goal modified today]  THN CM Short Term Goal #2 Start Date  10/21/18 [Goal modified/ extended today]  Interventions for Short Term Goal #2  Discussed patient's recently missed scheduled hospital follow up appointments with providers,  confirmed that patient has re-scheduled orthopedic provider and plans to re-schedule PCP appointment,  offered to assist patient in re-scheduling PCP appointment, which he adamantly declined,  encouraged patient to promptly re-schedule PCP appointment     I appreciate the opportunity to participate in [place]'s care,  Oneta Rack, RN, BSN, [employer] Coordinator [facility] Care Management  [phone]

## 2018-10-22 DIAGNOSIS — M86171 Other acute osteomyelitis, right ankle and foot: Secondary | ICD-10-CM | POA: Diagnosis not present

## 2018-10-22 DIAGNOSIS — D509 Iron deficiency anemia, unspecified: Secondary | ICD-10-CM | POA: Diagnosis not present

## 2018-10-22 DIAGNOSIS — N179 Acute kidney failure, unspecified: Secondary | ICD-10-CM | POA: Diagnosis not present

## 2018-10-22 DIAGNOSIS — N186 End stage renal disease: Secondary | ICD-10-CM | POA: Diagnosis not present

## 2018-10-22 DIAGNOSIS — E559 Vitamin D deficiency, unspecified: Secondary | ICD-10-CM | POA: Diagnosis not present

## 2018-10-22 DIAGNOSIS — D649 Anemia, unspecified: Secondary | ICD-10-CM | POA: Diagnosis not present

## 2018-10-24 DIAGNOSIS — L89623 Pressure ulcer of left heel, stage 3: Secondary | ICD-10-CM | POA: Diagnosis not present

## 2018-10-24 DIAGNOSIS — L97512 Non-pressure chronic ulcer of other part of right foot with fat layer exposed: Secondary | ICD-10-CM | POA: Diagnosis not present

## 2018-10-25 ENCOUNTER — Other Ambulatory Visit: Payer: Self-pay

## 2018-10-25 DIAGNOSIS — D509 Iron deficiency anemia, unspecified: Secondary | ICD-10-CM | POA: Diagnosis not present

## 2018-10-25 DIAGNOSIS — E559 Vitamin D deficiency, unspecified: Secondary | ICD-10-CM | POA: Diagnosis not present

## 2018-10-25 DIAGNOSIS — D649 Anemia, unspecified: Secondary | ICD-10-CM | POA: Diagnosis not present

## 2018-10-25 DIAGNOSIS — M86171 Other acute osteomyelitis, right ankle and foot: Secondary | ICD-10-CM | POA: Diagnosis not present

## 2018-10-25 DIAGNOSIS — N179 Acute kidney failure, unspecified: Secondary | ICD-10-CM | POA: Diagnosis not present

## 2018-10-25 DIAGNOSIS — N186 End stage renal disease: Secondary | ICD-10-CM | POA: Diagnosis not present

## 2018-10-25 NOTE — Patient Outreach (Signed)
[place] [facility]) Care Management  10/25/2018  Paul [name] January 30, 1966 [id number]   Social work referral received from [facility].  "Please place referral to [facility] CSW to assist patient with process to apply for food stamps- patient denies current food insecurity, but does report today that he believes he should qualify for food stamps, and would like to have assistance in knowing process to apply for food stamps. Patient denies other community resource needs. " Successful outreach to patient today.  BSW and patient discussed application process.  BSW will Administrator, [employer] for Food and Nutrition Services.  Will follow up within the next two weeks to ensure receipt and answer any questions.   Ronn Melena, BSW Social Worker [phone]

## 2018-10-26 DIAGNOSIS — L89623 Pressure ulcer of left heel, stage 3: Secondary | ICD-10-CM | POA: Diagnosis not present

## 2018-10-26 DIAGNOSIS — M86171 Other acute osteomyelitis, right ankle and foot: Secondary | ICD-10-CM | POA: Diagnosis not present

## 2018-10-26 DIAGNOSIS — L89613 Pressure ulcer of right heel, stage 3: Secondary | ICD-10-CM | POA: Diagnosis not present

## 2018-10-26 DIAGNOSIS — E1151 Type 2 diabetes mellitus with diabetic peripheral angiopathy without gangrene: Secondary | ICD-10-CM | POA: Diagnosis not present

## 2018-10-26 DIAGNOSIS — E1169 Type 2 diabetes mellitus with other specified complication: Secondary | ICD-10-CM | POA: Diagnosis not present

## 2018-10-26 DIAGNOSIS — B964 Proteus (mirabilis) (morganii) as the cause of diseases classified elsewhere: Secondary | ICD-10-CM | POA: Diagnosis not present

## 2018-10-27 DIAGNOSIS — N186 End stage renal disease: Secondary | ICD-10-CM | POA: Diagnosis not present

## 2018-10-27 DIAGNOSIS — M86171 Other acute osteomyelitis, right ankle and foot: Secondary | ICD-10-CM | POA: Diagnosis not present

## 2018-10-27 DIAGNOSIS — E559 Vitamin D deficiency, unspecified: Secondary | ICD-10-CM | POA: Diagnosis not present

## 2018-10-27 DIAGNOSIS — N179 Acute kidney failure, unspecified: Secondary | ICD-10-CM | POA: Diagnosis not present

## 2018-10-27 DIAGNOSIS — D509 Iron deficiency anemia, unspecified: Secondary | ICD-10-CM | POA: Diagnosis not present

## 2018-10-27 DIAGNOSIS — D649 Anemia, unspecified: Secondary | ICD-10-CM | POA: Diagnosis not present

## 2018-10-28 DIAGNOSIS — L89623 Pressure ulcer of left heel, stage 3: Secondary | ICD-10-CM | POA: Diagnosis not present

## 2018-10-28 DIAGNOSIS — B964 Proteus (mirabilis) (morganii) as the cause of diseases classified elsewhere: Secondary | ICD-10-CM | POA: Diagnosis not present

## 2018-10-28 DIAGNOSIS — E1169 Type 2 diabetes mellitus with other specified complication: Secondary | ICD-10-CM | POA: Diagnosis not present

## 2018-10-28 DIAGNOSIS — M86171 Other acute osteomyelitis, right ankle and foot: Secondary | ICD-10-CM | POA: Diagnosis not present

## 2018-10-28 DIAGNOSIS — L89613 Pressure ulcer of right heel, stage 3: Secondary | ICD-10-CM | POA: Diagnosis not present

## 2018-10-28 DIAGNOSIS — E1151 Type 2 diabetes mellitus with diabetic peripheral angiopathy without gangrene: Secondary | ICD-10-CM | POA: Diagnosis not present

## 2018-10-29 DIAGNOSIS — D649 Anemia, unspecified: Secondary | ICD-10-CM | POA: Diagnosis not present

## 2018-10-29 DIAGNOSIS — N179 Acute kidney failure, unspecified: Secondary | ICD-10-CM | POA: Diagnosis not present

## 2018-10-29 DIAGNOSIS — D509 Iron deficiency anemia, unspecified: Secondary | ICD-10-CM | POA: Diagnosis not present

## 2018-10-29 DIAGNOSIS — E559 Vitamin D deficiency, unspecified: Secondary | ICD-10-CM | POA: Diagnosis not present

## 2018-10-29 DIAGNOSIS — M86171 Other acute osteomyelitis, right ankle and foot: Secondary | ICD-10-CM | POA: Diagnosis not present

## 2018-10-29 DIAGNOSIS — N186 End stage renal disease: Secondary | ICD-10-CM | POA: Diagnosis not present

## 2018-10-31 ENCOUNTER — Other Ambulatory Visit: Payer: Self-pay | Admitting: *Deleted

## 2018-10-31 ENCOUNTER — Encounter: Payer: Self-pay | Admitting: *Deleted

## 2018-10-31 DIAGNOSIS — E1151 Type 2 diabetes mellitus with diabetic peripheral angiopathy without gangrene: Secondary | ICD-10-CM | POA: Diagnosis not present

## 2018-10-31 DIAGNOSIS — Z4800 Encounter for change or removal of nonsurgical wound dressing: Secondary | ICD-10-CM | POA: Diagnosis not present

## 2018-10-31 DIAGNOSIS — Z7901 Long term (current) use of anticoagulants: Secondary | ICD-10-CM | POA: Diagnosis not present

## 2018-10-31 DIAGNOSIS — E1140 Type 2 diabetes mellitus with diabetic neuropathy, unspecified: Secondary | ICD-10-CM | POA: Diagnosis not present

## 2018-10-31 DIAGNOSIS — Z5181 Encounter for therapeutic drug level monitoring: Secondary | ICD-10-CM | POA: Diagnosis not present

## 2018-10-31 DIAGNOSIS — Z7952 Long term (current) use of systemic steroids: Secondary | ICD-10-CM | POA: Diagnosis not present

## 2018-10-31 DIAGNOSIS — Z452 Encounter for adjustment and management of vascular access device: Secondary | ICD-10-CM | POA: Diagnosis not present

## 2018-10-31 DIAGNOSIS — B964 Proteus (mirabilis) (morganii) as the cause of diseases classified elsewhere: Secondary | ICD-10-CM | POA: Diagnosis not present

## 2018-10-31 DIAGNOSIS — L89613 Pressure ulcer of right heel, stage 3: Secondary | ICD-10-CM | POA: Diagnosis not present

## 2018-10-31 DIAGNOSIS — Z951 Presence of aortocoronary bypass graft: Secondary | ICD-10-CM | POA: Diagnosis not present

## 2018-10-31 DIAGNOSIS — L89623 Pressure ulcer of left heel, stage 3: Secondary | ICD-10-CM | POA: Diagnosis not present

## 2018-10-31 DIAGNOSIS — Z794 Long term (current) use of insulin: Secondary | ICD-10-CM | POA: Diagnosis not present

## 2018-10-31 DIAGNOSIS — N184 Chronic kidney disease, stage 4 (severe): Secondary | ICD-10-CM | POA: Diagnosis not present

## 2018-10-31 DIAGNOSIS — M86171 Other acute osteomyelitis, right ankle and foot: Secondary | ICD-10-CM | POA: Diagnosis not present

## 2018-10-31 DIAGNOSIS — E1169 Type 2 diabetes mellitus with other specified complication: Secondary | ICD-10-CM | POA: Diagnosis not present

## 2018-10-31 DIAGNOSIS — Z89421 Acquired absence of other right toe(s): Secondary | ICD-10-CM | POA: Diagnosis not present

## 2018-10-31 DIAGNOSIS — Z792 Long term (current) use of antibiotics: Secondary | ICD-10-CM | POA: Diagnosis not present

## 2018-10-31 DIAGNOSIS — I129 Hypertensive chronic kidney disease with stage 1 through stage 4 chronic kidney disease, or unspecified chronic kidney disease: Secondary | ICD-10-CM | POA: Diagnosis not present

## 2018-10-31 DIAGNOSIS — I2510 Atherosclerotic heart disease of native coronary artery without angina pectoris: Secondary | ICD-10-CM | POA: Diagnosis not present

## 2018-10-31 DIAGNOSIS — E1122 Type 2 diabetes mellitus with diabetic chronic kidney disease: Secondary | ICD-10-CM | POA: Diagnosis not present

## 2018-10-31 NOTE — Patient Outreach (Signed)
[place] [facility]) Care Management [place] Telephone Outreach PCP completes Transition of Care outreach post-hospital discharge Post-hospital discharge day # 20  10/31/2018  Paul [name] 09-15-1965 [id number]  Successful telephone outreach attempt to Paul [name], 53 y/o male referred to [name] by [name] Liaison [name] CM after recent hospitalization June 2-9, 2020 for CKD with newly initiated hemodialysis (Tues/ Thurs/ Saturday).  Patient was discharged home to self-care with home health services in place.  Patient has history including, but not limited to, HTN, CAD, CHF, Diabetes (controlled, A1C - 7.9), and osteomyelitis. Noted patient had rceent surgery on right foot for wound infection.  HIPAA/ identity verified with patient during phone call today.  Today, patient reports that he is "doing fine;" states that he has attended all recent HD sessions and "they are going just fine."  Denies pain and new/ recent falls; patient sounds to be in no obvious distress throughout brief phone call today, however, he is obviously irritated by my phone call today and provides only short answers.  Patient confirms that he continues to participate in home health services as ordered post-hospital discharge.  Confirms that he has spoken with Paul [name] around his request for information for process of applying for food stamps; states today that thus far, he has not yet received the information sent by Paul [name] and I encouraged him to continue looking out for the information and to continue engaging with Paul [name]; patient is agreeable.  Patient confirms that he is following a renal diet and he flatly declines discussion around same, stating he "knows what to eat."  Patient further reports:  Medications: -- again reports has/ taking all medicationsand takes as prescribed;denies questions/ concerns about current medications.  -- continues independently self-manages  medications -- provides a good general understanding of the purpose/ dosing and scheduling of medications -- Patient was recently discharged from hospital and all medications were reviewed with patient today; one discrepancy was identified: patient reports today that he is not taking torsemide 40 mg po QD on non- HD days; patient states that he decided to stop taking this medication due to side effects that made his hands "lock up;" stated that he has made the HD [name] and his physicians aware that he is not taking this medicine by his own choice; I attempted to question patient around which doctor he has discussed this with, and patient became very angry/ irritated and said "if you're just going to ask questions like this, I am not even talking to you."  I attempted to explain my reasoning behind asking him this (medication safety) and patient angrily said, "my nurse just walked in... I'm not talking to you any more."  Patient abruptly hung the phone up on me.  Medications Reviewed Today    Reviewed by Paul [name] (Registered Nurse) on 10/31/18 at 4  Med List Status: <None>  Medication Order Taking? Sig Documenting Provider Last Dose Status Informant  acetaminophen (TYLENOL) 325 MG tablet [id number]  Take 2 tablets (650 mg total) by mouth every 6 (six) hours as needed for mild pain (or Fever >/= 101). Paul Grayer, [name]  Active Self  allopurinol (ZYLOPRIM) 100 MG tablet [id number]  Take 1 tablet (100 mg total) by mouth daily. Paul Mandes, [name]  Active Self  amiodarone (PACERONE) 200 MG tablet [id number]  Take 200 mg by mouth daily. Take 200 mg twice a day for ten days, then take 200 mg once a day thereafter.  Paul [name]  Active Self           Med Note Paul [name]   Thu Oct 06, 2018  2:43 PM)    aspirin EC 81 MG tablet [id number]  Take 1 tablet (81 mg total) by mouth daily. Paul Gianotti, NP  Active Self  carvedilol (COREG) 3.125 MG tablet [id number]  Take 1 tablet  (3.125 mg total) by mouth 2 (two) times daily. Paul Grayer, [name]  Active Self           Med Note Paul [name]   Tue Sep 06, 2018 12:29 AM) Pt taking differently: takes one tablet (3.125 mg) every morning.  cefTAZidime in dextrose 5 % 50 mL [id number]  2 gm iv with dialysis for six more doses (last dose 10/25/2018 Paul Grayer, [name]  Active   Cholecalciferol (VITAMIN D3) 1.25 MG (50000 UT) CAPS [id number]  Take 1 capsule by mouth once a week. Paul [name]  Active Self  clopidogrel (PLAVIX) 75 MG tablet [id number]  Take 1 tablet (75 mg total) by mouth daily with breakfast. Mayo, Pete Pelt, [name]  Active Self  dicyclomine (BENTYL) 10 MG capsule [id number]  Take 1 capsule (10 mg total) by mouth 4 (four) times daily -  before meals and at bedtime. Paul Grayer, [name]  Active   gabapentin (NEURONTIN) 100 MG capsule [id number]  Take 1 capsule (100 mg total) by mouth 3 (three) times daily. Paul Grayer, [name]  Active   hydrocerin (EUCERIN) CREA [id number]  1 application daily to bilateral heels Paul Grayer, [name]  Active   HYDROcodone-acetaminophen (NORCO/VICODIN) 5-325 MG tablet [id number]  Take 1 tablet by mouth 2 (two) times a day.  Patient not taking: Reported on 10/13/2018   Paul Grayer, [name]  Active   loperamide (IMODIUM) 2 MG capsule [id number]  Take 1 capsule (2 mg total) by mouth every 8 (eight) hours as needed for diarrhea or loose stools. Paul Grayer, [name]  Active   predniSONE (DELTASONE) 5 MG tablet [id number]  Take 1 tablet (5 mg total) by mouth daily with breakfast. Paul Mandes, [name]  Active Self  torsemide (DEMADEX) 20 MG tablet [id number]  Take 40 mg (two tablets) on days that you do not have dialysis Paul Grayer, [name]  Active          Plan:  Patient will take medications as prescribed and will attend all scheduled provider appointments  Patient will promptly notify care providers for any new concerns/ issues/ problems that arise  Patient will actively participate in  home health services as ordered post-hospital discharge  I will share today's [place] CM notes/ care plan with patient's PCP as medication review today completes initial assessment  [facility] [place] CM outreach to continue with scheduled phone call next month  [place] CM Care Plan Problem One     Most Recent Value  Care Plan Problem One  Risk for hospital readmission related to complications of ESRD as evidenced by recent hospitalization and new dialysis treatments  Role Documenting the Problem One  Care Management [place] for Problem One  Active  [name] Long Term Goal   Member will not be readmitted to hospital within the next 31 days  THN Long Term Goal Start Date  10/13/18  Interventions for Problem One Long Term Goal  Discussed patient's current clinical condition with him and completed medication review,  confirmed that patient has attended all recent HD sessions and continues participating in home health services,  sent initial  assessment to patient's PCP  [facility] CM Short Term Goal #1   Member will report compliance with renal diet over the next 4 weeks  [facility]N CM Short Term Goal #1 Start Date  10/13/18  [facility] CM Short Term Goal #1 Met Date  10/31/18 [Goal Met]  Interventions for Short Term Goal #1  Confirmed that patient reports following renal diet and denies questions around same  [name] CM Short Term Goal #2   Over the next  15 days, patient will reschedule follow up appointment with primary [name] as evidenced by patient reporting during [place] outreach [Goal modified/extended today]  [facility] CM Short Term Goal #2 Start Date  10/31/18 [Goal modified/ extended today]  Interventions for Short Term Goal #2  Unable to assess- patient hung phone up on me     Oneta Rack, [name], BSN, [employer] Coordinator [name] Care Management  [phone]

## 2018-11-01 ENCOUNTER — Telehealth: Payer: Self-pay | Admitting: Licensed Clinical Social Worker

## 2018-11-01 DIAGNOSIS — E1151 Type 2 diabetes mellitus with diabetic peripheral angiopathy without gangrene: Secondary | ICD-10-CM | POA: Diagnosis not present

## 2018-11-01 DIAGNOSIS — M86171 Other acute osteomyelitis, right ankle and foot: Secondary | ICD-10-CM | POA: Diagnosis not present

## 2018-11-01 DIAGNOSIS — L89623 Pressure ulcer of left heel, stage 3: Secondary | ICD-10-CM | POA: Diagnosis not present

## 2018-11-01 DIAGNOSIS — B964 Proteus (mirabilis) (morganii) as the cause of diseases classified elsewhere: Secondary | ICD-10-CM | POA: Diagnosis not present

## 2018-11-01 DIAGNOSIS — E1169 Type 2 diabetes mellitus with other specified complication: Secondary | ICD-10-CM | POA: Diagnosis not present

## 2018-11-01 DIAGNOSIS — L89613 Pressure ulcer of right heel, stage 3: Secondary | ICD-10-CM | POA: Diagnosis not present

## 2018-11-01 NOTE — Telephone Encounter (Signed)
I called patient to see how he was feeling and to see if his PICC line was removed. Patient states he is feeling well and the PICC and antibiotics were stopped on Thursday 10/27/2018

## 2018-11-02 DIAGNOSIS — L89623 Pressure ulcer of left heel, stage 3: Secondary | ICD-10-CM | POA: Diagnosis not present

## 2018-11-02 DIAGNOSIS — E1169 Type 2 diabetes mellitus with other specified complication: Secondary | ICD-10-CM | POA: Diagnosis not present

## 2018-11-02 DIAGNOSIS — B964 Proteus (mirabilis) (morganii) as the cause of diseases classified elsewhere: Secondary | ICD-10-CM | POA: Diagnosis not present

## 2018-11-02 DIAGNOSIS — L89613 Pressure ulcer of right heel, stage 3: Secondary | ICD-10-CM | POA: Diagnosis not present

## 2018-11-02 DIAGNOSIS — M86171 Other acute osteomyelitis, right ankle and foot: Secondary | ICD-10-CM | POA: Diagnosis not present

## 2018-11-02 DIAGNOSIS — E1151 Type 2 diabetes mellitus with diabetic peripheral angiopathy without gangrene: Secondary | ICD-10-CM | POA: Diagnosis not present

## 2018-11-03 ENCOUNTER — Other Ambulatory Visit: Payer: Self-pay

## 2018-11-03 DIAGNOSIS — N179 Acute kidney failure, unspecified: Secondary | ICD-10-CM | POA: Diagnosis not present

## 2018-11-03 DIAGNOSIS — N189 Chronic kidney disease, unspecified: Secondary | ICD-10-CM | POA: Diagnosis not present

## 2018-11-03 DIAGNOSIS — N184 Chronic kidney disease, stage 4 (severe): Secondary | ICD-10-CM | POA: Diagnosis not present

## 2018-11-03 DIAGNOSIS — N2581 Secondary hyperparathyroidism of renal origin: Secondary | ICD-10-CM | POA: Diagnosis not present

## 2018-11-03 DIAGNOSIS — D509 Iron deficiency anemia, unspecified: Secondary | ICD-10-CM | POA: Diagnosis not present

## 2018-11-03 DIAGNOSIS — D649 Anemia, unspecified: Secondary | ICD-10-CM | POA: Diagnosis not present

## 2018-11-03 NOTE — Patient Outreach (Signed)
[place] [facility]) Care Management  11/03/2018  Paul Ku. 16-Sep-1965 [id number]   Follow up call to patient to ensure receipt of Application for Food and Nutrition Services that was mailed on 10/25/18.  Patient confirmed receipt but said he has not had the opportunity to complete it yet.  BSW is closing case but did provide patient with contact information and encouraged him to call if additional needs arise or he needs further assistance with application.  Ronn Melena, BSW Social Worker [phone]

## 2018-11-04 DIAGNOSIS — B964 Proteus (mirabilis) (morganii) as the cause of diseases classified elsewhere: Secondary | ICD-10-CM | POA: Diagnosis not present

## 2018-11-04 DIAGNOSIS — E1169 Type 2 diabetes mellitus with other specified complication: Secondary | ICD-10-CM | POA: Diagnosis not present

## 2018-11-04 DIAGNOSIS — L89613 Pressure ulcer of right heel, stage 3: Secondary | ICD-10-CM | POA: Diagnosis not present

## 2018-11-04 DIAGNOSIS — L89623 Pressure ulcer of left heel, stage 3: Secondary | ICD-10-CM | POA: Diagnosis not present

## 2018-11-04 DIAGNOSIS — E1151 Type 2 diabetes mellitus with diabetic peripheral angiopathy without gangrene: Secondary | ICD-10-CM | POA: Diagnosis not present

## 2018-11-04 DIAGNOSIS — M86171 Other acute osteomyelitis, right ankle and foot: Secondary | ICD-10-CM | POA: Diagnosis not present

## 2018-11-05 DIAGNOSIS — N2581 Secondary hyperparathyroidism of renal origin: Secondary | ICD-10-CM | POA: Diagnosis not present

## 2018-11-05 DIAGNOSIS — N184 Chronic kidney disease, stage 4 (severe): Secondary | ICD-10-CM | POA: Diagnosis not present

## 2018-11-05 DIAGNOSIS — N179 Acute kidney failure, unspecified: Secondary | ICD-10-CM | POA: Diagnosis not present

## 2018-11-05 DIAGNOSIS — D509 Iron deficiency anemia, unspecified: Secondary | ICD-10-CM | POA: Diagnosis not present

## 2018-11-05 DIAGNOSIS — N189 Chronic kidney disease, unspecified: Secondary | ICD-10-CM | POA: Diagnosis not present

## 2018-11-05 DIAGNOSIS — D649 Anemia, unspecified: Secondary | ICD-10-CM | POA: Diagnosis not present

## 2018-11-07 DIAGNOSIS — L89613 Pressure ulcer of right heel, stage 3: Secondary | ICD-10-CM | POA: Diagnosis not present

## 2018-11-07 DIAGNOSIS — B964 Proteus (mirabilis) (morganii) as the cause of diseases classified elsewhere: Secondary | ICD-10-CM | POA: Diagnosis not present

## 2018-11-07 DIAGNOSIS — M86171 Other acute osteomyelitis, right ankle and foot: Secondary | ICD-10-CM | POA: Diagnosis not present

## 2018-11-07 DIAGNOSIS — E1169 Type 2 diabetes mellitus with other specified complication: Secondary | ICD-10-CM | POA: Diagnosis not present

## 2018-11-07 DIAGNOSIS — L89623 Pressure ulcer of left heel, stage 3: Secondary | ICD-10-CM | POA: Diagnosis not present

## 2018-11-07 DIAGNOSIS — E1151 Type 2 diabetes mellitus with diabetic peripheral angiopathy without gangrene: Secondary | ICD-10-CM | POA: Diagnosis not present

## 2018-11-08 DIAGNOSIS — D649 Anemia, unspecified: Secondary | ICD-10-CM | POA: Diagnosis not present

## 2018-11-08 DIAGNOSIS — N189 Chronic kidney disease, unspecified: Secondary | ICD-10-CM | POA: Diagnosis not present

## 2018-11-08 DIAGNOSIS — D509 Iron deficiency anemia, unspecified: Secondary | ICD-10-CM | POA: Diagnosis not present

## 2018-11-08 DIAGNOSIS — L97512 Non-pressure chronic ulcer of other part of right foot with fat layer exposed: Secondary | ICD-10-CM | POA: Diagnosis not present

## 2018-11-08 DIAGNOSIS — N179 Acute kidney failure, unspecified: Secondary | ICD-10-CM | POA: Diagnosis not present

## 2018-11-08 DIAGNOSIS — N2581 Secondary hyperparathyroidism of renal origin: Secondary | ICD-10-CM | POA: Diagnosis not present

## 2018-11-08 DIAGNOSIS — N184 Chronic kidney disease, stage 4 (severe): Secondary | ICD-10-CM | POA: Diagnosis not present

## 2018-11-09 DIAGNOSIS — B964 Proteus (mirabilis) (morganii) as the cause of diseases classified elsewhere: Secondary | ICD-10-CM | POA: Diagnosis not present

## 2018-11-09 DIAGNOSIS — L89623 Pressure ulcer of left heel, stage 3: Secondary | ICD-10-CM | POA: Diagnosis not present

## 2018-11-09 DIAGNOSIS — M86171 Other acute osteomyelitis, right ankle and foot: Secondary | ICD-10-CM | POA: Diagnosis not present

## 2018-11-09 DIAGNOSIS — L89613 Pressure ulcer of right heel, stage 3: Secondary | ICD-10-CM | POA: Diagnosis not present

## 2018-11-09 DIAGNOSIS — E1151 Type 2 diabetes mellitus with diabetic peripheral angiopathy without gangrene: Secondary | ICD-10-CM | POA: Diagnosis not present

## 2018-11-09 DIAGNOSIS — E1169 Type 2 diabetes mellitus with other specified complication: Secondary | ICD-10-CM | POA: Diagnosis not present

## 2018-11-11 DIAGNOSIS — B964 Proteus (mirabilis) (morganii) as the cause of diseases classified elsewhere: Secondary | ICD-10-CM | POA: Diagnosis not present

## 2018-11-11 DIAGNOSIS — E1151 Type 2 diabetes mellitus with diabetic peripheral angiopathy without gangrene: Secondary | ICD-10-CM | POA: Diagnosis not present

## 2018-11-11 DIAGNOSIS — L89613 Pressure ulcer of right heel, stage 3: Secondary | ICD-10-CM | POA: Diagnosis not present

## 2018-11-11 DIAGNOSIS — M86171 Other acute osteomyelitis, right ankle and foot: Secondary | ICD-10-CM | POA: Diagnosis not present

## 2018-11-11 DIAGNOSIS — E1169 Type 2 diabetes mellitus with other specified complication: Secondary | ICD-10-CM | POA: Diagnosis not present

## 2018-11-11 DIAGNOSIS — L89623 Pressure ulcer of left heel, stage 3: Secondary | ICD-10-CM | POA: Diagnosis not present

## 2018-11-12 DIAGNOSIS — N189 Chronic kidney disease, unspecified: Secondary | ICD-10-CM | POA: Diagnosis not present

## 2018-11-12 DIAGNOSIS — N184 Chronic kidney disease, stage 4 (severe): Secondary | ICD-10-CM | POA: Diagnosis not present

## 2018-11-12 DIAGNOSIS — D509 Iron deficiency anemia, unspecified: Secondary | ICD-10-CM | POA: Diagnosis not present

## 2018-11-12 DIAGNOSIS — D649 Anemia, unspecified: Secondary | ICD-10-CM | POA: Diagnosis not present

## 2018-11-12 DIAGNOSIS — N179 Acute kidney failure, unspecified: Secondary | ICD-10-CM | POA: Diagnosis not present

## 2018-11-12 DIAGNOSIS — N2581 Secondary hyperparathyroidism of renal origin: Secondary | ICD-10-CM | POA: Diagnosis not present

## 2018-11-14 ENCOUNTER — Other Ambulatory Visit: Payer: Self-pay | Admitting: *Deleted

## 2018-11-14 ENCOUNTER — Encounter: Payer: Self-pay | Admitting: *Deleted

## 2018-11-14 DIAGNOSIS — E1151 Type 2 diabetes mellitus with diabetic peripheral angiopathy without gangrene: Secondary | ICD-10-CM | POA: Diagnosis not present

## 2018-11-14 DIAGNOSIS — E1169 Type 2 diabetes mellitus with other specified complication: Secondary | ICD-10-CM | POA: Diagnosis not present

## 2018-11-14 DIAGNOSIS — B964 Proteus (mirabilis) (morganii) as the cause of diseases classified elsewhere: Secondary | ICD-10-CM | POA: Diagnosis not present

## 2018-11-14 DIAGNOSIS — M86171 Other acute osteomyelitis, right ankle and foot: Secondary | ICD-10-CM | POA: Diagnosis not present

## 2018-11-14 DIAGNOSIS — L89613 Pressure ulcer of right heel, stage 3: Secondary | ICD-10-CM | POA: Diagnosis not present

## 2018-11-14 DIAGNOSIS — L89623 Pressure ulcer of left heel, stage 3: Secondary | ICD-10-CM | POA: Diagnosis not present

## 2018-11-14 NOTE — Patient Outreach (Signed)
[facility]) Care Management [place] Telephone Outreach Post-hospital discharge day # 34  11/14/2018  Paul [name]. [birth year] [id number]  Successful telephone outreach attempt to Paul [name], 53 y/o male referred to [place] by [facility] Liaison RN CM after recent hospitalization June 2-9, 2020 for CKD with newly initiated hemodialysis (Tues/ Thurs/ Saturday).Patient was discharged home to self-care with home health services in place. Patient has history including, but not limited to,HTN, CAD, CHF, Diabetes (controlled, A1C - 7.9), and osteomyelitis.Noted patienthadrecentsurgery on right foot for wound infection. HIPAA/ identity verified with patient during phone call today.  Today, patient reports that he is "doing great;" states that he is waiting on home health nurse to arrive at "any moment" and again requests that we have a short conversation.  Patient confirms that he is not in pain and does not have any clinical concerns; states that he has attended all established hemodialysis appointments and has no concerns around hemodialysis and is tolerating well.  Reports that he has not yet made post-hospital discharge PCP appointment, stating that he sees his doctors at the hemodialysis center "regularly."  Patient was encouraged to consider scheduling PCP appointment soon.  Patient was also encouraged to promptly report any new concerns, issues, or problems to [facility] providers promptly and he agrees to do so.  Patient further confirms that he has spoken to Paul [name] around his request for food stamps and he denies ongoing needs around community resources; states he continues to drive himself to all provider appointments/ errands.  Patient denies further issues, concerns, or problems today.  I confirmed that patient has my direct phone number, the main [facility] CM office phone number, and the [facility] CM 24-hour nurse advice phone number should issues arise  prior to next scheduled [place] outreach, as patient is agreeable to a follow call next month.  Encouraged patient to contact me directly if needs, questions, issues, or concerns arise prior to next scheduled outreach; patient agreed to do so.   [facility] CM Care Plan Problem One     Most Recent Value  Care Plan Problem One  Risk for hospital readmission related to complications of ESRD as evidenced by recent hospitalization and new dialysis treatments  Role Documenting the Problem One  Care Management [place] for Problem One  Not Active  [facility] Long Term Goal   Member will not be readmitted to hospital within the next 31 days  [facility] Long Term Goal Start Date  10/13/18  [facility] Long Term Goal Met Date  11/14/18 [Goal Met]  Interventions for Problem One Long Term Goal  Discussed with patient his ongoing clinical condition and confirmed that he does not have any recent clinical concerns,  confirmed that patient has not had any recent ED or hospital visits and continues attending HD sessions as established   [facility] CM Short Term Goal #2   Over the next  15 days, patient will reschedule follow up appointment with primary MD as evidenced by patient reporting during [place] outreach  [facility] CM Short Term Goal #2 Start Date  10/31/18  [facility] CM Short Term Goal #2 Met Date  11/14/18 [Goal not met]  Interventions for Short Term Goal #2  Discussed with patient whether or not he has attended/ scheduled post-hospital discharge PCP office visit,  confirmed that patient has not done either and declines assistance or discussion around importance of scheduling PCP office visit    [facility]  CM Care Plan Problem Two     Most Recent Value  Care Plan Problem Two  Need for ongoing self-health management reinforcement of chronic disease state of CKD in patient new to HD, as evidenced by recent hospitalization requiring new hemodialysis treatments  Role Documenting the Problem Two  Care Management Coordinator   Care Plan for Problem Two  Active  Interventions for Problem Two Long Term Goal   Confirmed that patient has been attending HD sessions and reports not missing any scheduled sessions,  confirmed that patient has spoken with HD MD and has no concerns aorund HD sessions and continues transportating self to HD sessions  [facility] Long Term Goal  Over the next 31 days, patient will attend all HD sessions as established, as evidenced by patient reporting and collaboration with HD team as indicated during [place] CM outreach  [place] Term Goal Start Date  11/14/18  [facility] CM Short Term Goal #1   Over the next 30 days, patient will contact care providers for any new concerns, issues, or problems that arise, as evidenced by patient reporting and collaboration with care providers as indicated during [facility] RN CM outreach  [facility] CM Short Term Goal #1 Start Date  11/14/18  Interventions for Short Term Goal #2   Encouraged patient to contact care providers promptly for any new concerns, issues, or problems that arise,  confirmed that patient has my direct phone number and contact information for all care providers     Paul Rack, RN, BSN, [place] Coordinator [facility] Care Management  [phone]

## 2018-11-15 DIAGNOSIS — D649 Anemia, unspecified: Secondary | ICD-10-CM | POA: Diagnosis not present

## 2018-11-15 DIAGNOSIS — D509 Iron deficiency anemia, unspecified: Secondary | ICD-10-CM | POA: Diagnosis not present

## 2018-11-15 DIAGNOSIS — N184 Chronic kidney disease, stage 4 (severe): Secondary | ICD-10-CM | POA: Diagnosis not present

## 2018-11-15 DIAGNOSIS — N189 Chronic kidney disease, unspecified: Secondary | ICD-10-CM | POA: Diagnosis not present

## 2018-11-15 DIAGNOSIS — N179 Acute kidney failure, unspecified: Secondary | ICD-10-CM | POA: Diagnosis not present

## 2018-11-15 DIAGNOSIS — N2581 Secondary hyperparathyroidism of renal origin: Secondary | ICD-10-CM | POA: Diagnosis not present

## 2018-11-16 DIAGNOSIS — L97513 Non-pressure chronic ulcer of other part of right foot with necrosis of muscle: Secondary | ICD-10-CM | POA: Diagnosis not present

## 2018-11-18 DIAGNOSIS — L89623 Pressure ulcer of left heel, stage 3: Secondary | ICD-10-CM | POA: Diagnosis not present

## 2018-11-18 DIAGNOSIS — E1151 Type 2 diabetes mellitus with diabetic peripheral angiopathy without gangrene: Secondary | ICD-10-CM | POA: Diagnosis not present

## 2018-11-18 DIAGNOSIS — M86171 Other acute osteomyelitis, right ankle and foot: Secondary | ICD-10-CM | POA: Diagnosis not present

## 2018-11-18 DIAGNOSIS — L89613 Pressure ulcer of right heel, stage 3: Secondary | ICD-10-CM | POA: Diagnosis not present

## 2018-11-18 DIAGNOSIS — B964 Proteus (mirabilis) (morganii) as the cause of diseases classified elsewhere: Secondary | ICD-10-CM | POA: Diagnosis not present

## 2018-11-18 DIAGNOSIS — E1169 Type 2 diabetes mellitus with other specified complication: Secondary | ICD-10-CM | POA: Diagnosis not present

## 2018-11-20 DIAGNOSIS — B964 Proteus (mirabilis) (morganii) as the cause of diseases classified elsewhere: Secondary | ICD-10-CM | POA: Diagnosis not present

## 2018-11-20 DIAGNOSIS — L89613 Pressure ulcer of right heel, stage 3: Secondary | ICD-10-CM | POA: Diagnosis not present

## 2018-11-20 DIAGNOSIS — L89623 Pressure ulcer of left heel, stage 3: Secondary | ICD-10-CM | POA: Diagnosis not present

## 2018-11-20 DIAGNOSIS — E1151 Type 2 diabetes mellitus with diabetic peripheral angiopathy without gangrene: Secondary | ICD-10-CM | POA: Diagnosis not present

## 2018-11-20 DIAGNOSIS — E1169 Type 2 diabetes mellitus with other specified complication: Secondary | ICD-10-CM | POA: Diagnosis not present

## 2018-11-20 DIAGNOSIS — M86171 Other acute osteomyelitis, right ankle and foot: Secondary | ICD-10-CM | POA: Diagnosis not present

## 2018-11-21 ENCOUNTER — Encounter: Payer: Self-pay | Admitting: Internal Medicine

## 2018-11-21 ENCOUNTER — Inpatient Hospital Stay: Payer: Medicare Other

## 2018-11-21 ENCOUNTER — Other Ambulatory Visit: Payer: Self-pay

## 2018-11-21 ENCOUNTER — Inpatient Hospital Stay
Admission: AD | Admit: 2018-11-21 | Discharge: 2018-12-01 | DRG: 629 | Disposition: A | Discharge status: Home Health | Payer: Medicare Other | Source: Ambulatory Visit | Attending: Internal Medicine | Admitting: Internal Medicine

## 2018-11-21 DIAGNOSIS — M109 Gout, unspecified: Secondary | ICD-10-CM | POA: Diagnosis present

## 2018-11-21 DIAGNOSIS — Z22322 Carrier or suspected carrier of Methicillin resistant Staphylococcus aureus: Secondary | ICD-10-CM

## 2018-11-21 DIAGNOSIS — M869 Osteomyelitis, unspecified: Secondary | ICD-10-CM | POA: Diagnosis not present

## 2018-11-21 DIAGNOSIS — N186 End stage renal disease: Secondary | ICD-10-CM | POA: Diagnosis present

## 2018-11-21 DIAGNOSIS — B965 Pseudomonas (aeruginosa) (mallei) (pseudomallei) as the cause of diseases classified elsewhere: Secondary | ICD-10-CM | POA: Diagnosis present

## 2018-11-21 DIAGNOSIS — N189 Chronic kidney disease, unspecified: Secondary | ICD-10-CM | POA: Diagnosis not present

## 2018-11-21 DIAGNOSIS — I739 Peripheral vascular disease, unspecified: Secondary | ICD-10-CM | POA: Diagnosis not present

## 2018-11-21 DIAGNOSIS — Z7952 Long term (current) use of systemic steroids: Secondary | ICD-10-CM

## 2018-11-21 DIAGNOSIS — E1169 Type 2 diabetes mellitus with other specified complication: Secondary | ICD-10-CM | POA: Diagnosis not present

## 2018-11-21 DIAGNOSIS — Z967 Presence of other bone and tendon implants: Secondary | ICD-10-CM | POA: Diagnosis not present

## 2018-11-21 DIAGNOSIS — I5022 Chronic systolic (congestive) heart failure: Secondary | ICD-10-CM | POA: Diagnosis not present

## 2018-11-21 DIAGNOSIS — I480 Paroxysmal atrial fibrillation: Secondary | ICD-10-CM | POA: Diagnosis present

## 2018-11-21 DIAGNOSIS — L97513 Non-pressure chronic ulcer of other part of right foot with necrosis of muscle: Secondary | ICD-10-CM | POA: Diagnosis not present

## 2018-11-21 DIAGNOSIS — N179 Acute kidney failure, unspecified: Secondary | ICD-10-CM | POA: Diagnosis not present

## 2018-11-21 DIAGNOSIS — I10 Essential (primary) hypertension: Secondary | ICD-10-CM | POA: Diagnosis not present

## 2018-11-21 DIAGNOSIS — Z883 Allergy status to other anti-infective agents status: Secondary | ICD-10-CM

## 2018-11-21 DIAGNOSIS — I255 Ischemic cardiomyopathy: Secondary | ICD-10-CM | POA: Diagnosis present

## 2018-11-21 DIAGNOSIS — E11621 Type 2 diabetes mellitus with foot ulcer: Secondary | ICD-10-CM | POA: Diagnosis not present

## 2018-11-21 DIAGNOSIS — Z8249 Family history of ischemic heart disease and other diseases of the circulatory system: Secondary | ICD-10-CM

## 2018-11-21 DIAGNOSIS — I130 Hypertensive heart and chronic kidney disease with heart failure and stage 1 through stage 4 chronic kidney disease, or unspecified chronic kidney disease: Secondary | ICD-10-CM | POA: Diagnosis not present

## 2018-11-21 DIAGNOSIS — I509 Heart failure, unspecified: Secondary | ICD-10-CM | POA: Diagnosis not present

## 2018-11-21 DIAGNOSIS — L97429 Non-pressure chronic ulcer of left heel and midfoot with unspecified severity: Secondary | ICD-10-CM | POA: Diagnosis not present

## 2018-11-21 DIAGNOSIS — Z95810 Presence of automatic (implantable) cardiac defibrillator: Secondary | ICD-10-CM | POA: Diagnosis not present

## 2018-11-21 DIAGNOSIS — N2581 Secondary hyperparathyroidism of renal origin: Secondary | ICD-10-CM | POA: Diagnosis present

## 2018-11-21 DIAGNOSIS — I252 Old myocardial infarction: Secondary | ICD-10-CM

## 2018-11-21 DIAGNOSIS — I2510 Atherosclerotic heart disease of native coronary artery without angina pectoris: Secondary | ICD-10-CM | POA: Diagnosis present

## 2018-11-21 DIAGNOSIS — L97414 Non-pressure chronic ulcer of right heel and midfoot with necrosis of bone: Secondary | ICD-10-CM | POA: Diagnosis not present

## 2018-11-21 DIAGNOSIS — Z79899 Other long term (current) drug therapy: Secondary | ICD-10-CM

## 2018-11-21 DIAGNOSIS — E1140 Type 2 diabetes mellitus with diabetic neuropathy, unspecified: Secondary | ICD-10-CM | POA: Diagnosis present

## 2018-11-21 DIAGNOSIS — Z888 Allergy status to other drugs, medicaments and biological substances status: Secondary | ICD-10-CM

## 2018-11-21 DIAGNOSIS — Z20828 Contact with and (suspected) exposure to other viral communicable diseases: Secondary | ICD-10-CM | POA: Diagnosis present

## 2018-11-21 DIAGNOSIS — I5042 Chronic combined systolic (congestive) and diastolic (congestive) heart failure: Secondary | ICD-10-CM | POA: Diagnosis present

## 2018-11-21 DIAGNOSIS — Z992 Dependence on renal dialysis: Secondary | ICD-10-CM

## 2018-11-21 DIAGNOSIS — Z9049 Acquired absence of other specified parts of digestive tract: Secondary | ICD-10-CM

## 2018-11-21 DIAGNOSIS — Z89421 Acquired absence of other right toe(s): Secondary | ICD-10-CM

## 2018-11-21 DIAGNOSIS — E875 Hyperkalemia: Secondary | ICD-10-CM | POA: Diagnosis present

## 2018-11-21 DIAGNOSIS — M868X9 Other osteomyelitis, unspecified sites: Secondary | ICD-10-CM | POA: Diagnosis not present

## 2018-11-21 DIAGNOSIS — I132 Hypertensive heart and chronic kidney disease with heart failure and with stage 5 chronic kidney disease, or end stage renal disease: Secondary | ICD-10-CM | POA: Diagnosis present

## 2018-11-21 DIAGNOSIS — Z7982 Long term (current) use of aspirin: Secondary | ICD-10-CM

## 2018-11-21 DIAGNOSIS — E1122 Type 2 diabetes mellitus with diabetic chronic kidney disease: Secondary | ICD-10-CM | POA: Diagnosis not present

## 2018-11-21 DIAGNOSIS — Z7902 Long term (current) use of antithrombotics/antiplatelets: Secondary | ICD-10-CM

## 2018-11-21 DIAGNOSIS — D631 Anemia in chronic kidney disease: Secondary | ICD-10-CM | POA: Diagnosis present

## 2018-11-21 DIAGNOSIS — E1142 Type 2 diabetes mellitus with diabetic polyneuropathy: Secondary | ICD-10-CM | POA: Diagnosis not present

## 2018-11-21 DIAGNOSIS — I129 Hypertensive chronic kidney disease with stage 1 through stage 4 chronic kidney disease, or unspecified chronic kidney disease: Secondary | ICD-10-CM | POA: Diagnosis not present

## 2018-11-21 DIAGNOSIS — I120 Hypertensive chronic kidney disease with stage 5 chronic kidney disease or end stage renal disease: Secondary | ICD-10-CM | POA: Diagnosis not present

## 2018-11-21 DIAGNOSIS — Z886 Allergy status to analgesic agent status: Secondary | ICD-10-CM

## 2018-11-21 DIAGNOSIS — L89614 Pressure ulcer of right heel, stage 4: Secondary | ICD-10-CM | POA: Diagnosis not present

## 2018-11-21 DIAGNOSIS — E1151 Type 2 diabetes mellitus with diabetic peripheral angiopathy without gangrene: Secondary | ICD-10-CM | POA: Diagnosis present

## 2018-11-21 DIAGNOSIS — E11628 Type 2 diabetes mellitus with other skin complications: Secondary | ICD-10-CM | POA: Diagnosis not present

## 2018-11-21 DIAGNOSIS — M86171 Other acute osteomyelitis, right ankle and foot: Secondary | ICD-10-CM | POA: Diagnosis not present

## 2018-11-21 DIAGNOSIS — Z951 Presence of aortocoronary bypass graft: Secondary | ICD-10-CM

## 2018-11-21 DIAGNOSIS — Z8614 Personal history of Methicillin resistant Staphylococcus aureus infection: Secondary | ICD-10-CM

## 2018-11-21 DIAGNOSIS — L97416 Non-pressure chronic ulcer of right heel and midfoot with bone involvement without evidence of necrosis: Secondary | ICD-10-CM | POA: Diagnosis present

## 2018-11-21 DIAGNOSIS — Z8673 Personal history of transient ischemic attack (TIA), and cerebral infarction without residual deficits: Secondary | ICD-10-CM

## 2018-11-21 DIAGNOSIS — G4733 Obstructive sleep apnea (adult) (pediatric): Secondary | ICD-10-CM | POA: Diagnosis present

## 2018-11-21 DIAGNOSIS — Z833 Family history of diabetes mellitus: Secondary | ICD-10-CM

## 2018-11-21 DIAGNOSIS — Z978 Presence of other specified devices: Secondary | ICD-10-CM | POA: Diagnosis not present

## 2018-11-21 DIAGNOSIS — N184 Chronic kidney disease, stage 4 (severe): Secondary | ICD-10-CM | POA: Diagnosis not present

## 2018-11-21 DIAGNOSIS — L97413 Non-pressure chronic ulcer of right heel and midfoot with necrosis of muscle: Secondary | ICD-10-CM | POA: Diagnosis not present

## 2018-11-21 LAB — COMPREHENSIVE METABOLIC PANEL
ALT: 14 U/L (ref 0–44)
AST: 18 U/L (ref 15–41)
Albumin: 3.5 g/dL (ref 3.5–5.0)
Alkaline Phosphatase: 108 U/L (ref 38–126)
Anion gap: 15 (ref 5–15)
BUN: 53 mg/dL — ABNORMAL HIGH (ref 6–20)
CO2: 20 mmol/L — ABNORMAL LOW (ref 22–32)
Calcium: 8.8 mg/dL — ABNORMAL LOW (ref 8.9–10.3)
Chloride: 100 mmol/L (ref 98–111)
Creatinine, Ser: 9.07 mg/dL — ABNORMAL HIGH (ref 0.61–1.24)
GFR calc Af Amer: 7 mL/min — ABNORMAL LOW (ref >60)
GFR calc non Af Amer: 6 mL/min — ABNORMAL LOW (ref >60)
Glucose, Bld: 217 mg/dL — ABNORMAL HIGH (ref 70–99)
Potassium: 5.6 mmol/L — ABNORMAL HIGH (ref 3.5–5.1)
Sodium: 135 mmol/L (ref 135–145)
Total Bilirubin: 0.6 mg/dL (ref 0.3–1.2)
Total Protein: 7.9 g/dL (ref 6.5–8.1)

## 2018-11-21 LAB — CBC
HCT: 31.6 % — ABNORMAL LOW (ref 39.0–52.0)
Hemoglobin: 9.5 g/dL — ABNORMAL LOW (ref 13.0–17.0)
MCH: 27.5 pg (ref 26.0–34.0)
MCHC: 30.1 g/dL (ref 30.0–36.0)
MCV: 91.3 fL (ref 80.0–100.0)
Platelets: 269 10*3/uL (ref 150–400)
RBC: 3.46 MIL/uL — ABNORMAL LOW (ref 4.22–5.81)
RDW: 17.2 % — ABNORMAL HIGH (ref 11.5–15.5)
WBC: 11.7 10*3/uL — ABNORMAL HIGH (ref 4.0–10.5)
nRBC: 0 % (ref 0.0–0.2)

## 2018-11-21 LAB — PROTIME-INR
INR: 1.1 (ref 0.8–1.2)
Prothrombin Time: 13.7 seconds (ref 11.4–15.2)

## 2018-11-21 LAB — TYPE AND SCREEN
ABO/RH(D): AB POS
Antibody Screen: NEGATIVE

## 2018-11-21 LAB — SARS CORONAVIRUS 2 BY RT PCR (HOSPITAL ORDER, PERFORMED IN ~~LOC~~ HOSPITAL LAB): SARS Coronavirus 2: NEGATIVE

## 2018-11-21 LAB — SEDIMENTATION RATE: Sed Rate: 86 mm/hr — ABNORMAL HIGH (ref 0–20)

## 2018-11-21 MED ORDER — PREDNISONE 10 MG PO TABS
10.0000 mg | ORAL_TABLET | Freq: Every day | ORAL | Status: DC
  Administered 2018-11-22 – 2018-12-01 (×9): 10 mg via ORAL
  Filled 2018-11-21 (×9): qty 1

## 2018-11-21 MED ORDER — HYDROCODONE-ACETAMINOPHEN 5-325 MG PO TABS
1.0000 | ORAL_TABLET | Freq: Four times a day (QID) | ORAL | Status: DC | PRN
  Administered 2018-11-21 – 2018-11-25 (×11): 1 via ORAL
  Filled 2018-11-21 (×11): qty 1

## 2018-11-21 MED ORDER — LOPERAMIDE HCL 2 MG PO CAPS
2.0000 mg | ORAL_CAPSULE | Freq: Three times a day (TID) | ORAL | Status: DC | PRN
  Filled 2018-11-21: qty 1

## 2018-11-21 MED ORDER — AMIODARONE HCL 200 MG PO TABS
200.0000 mg | ORAL_TABLET | Freq: Every day | ORAL | Status: DC
  Administered 2018-11-22 – 2018-11-23 (×2): 200 mg via ORAL
  Filled 2018-11-21 (×2): qty 1

## 2018-11-21 MED ORDER — ASPIRIN EC 81 MG PO TBEC
81.0000 mg | DELAYED_RELEASE_TABLET | Freq: Every day | ORAL | Status: DC
  Administered 2018-11-22 – 2018-12-01 (×9): 81 mg via ORAL
  Filled 2018-11-21 (×9): qty 1

## 2018-11-21 MED ORDER — VITAMIN D3 25 MCG (1000 UNIT) PO TABS
5000.0000 [IU] | ORAL_TABLET | ORAL | Status: DC
  Filled 2018-11-21: qty 5

## 2018-11-21 MED ORDER — DICYCLOMINE HCL 10 MG PO CAPS
10.0000 mg | ORAL_CAPSULE | Freq: Three times a day (TID) | ORAL | Status: DC
  Administered 2018-11-21 – 2018-12-01 (×33): 10 mg via ORAL
  Filled 2018-11-21 (×41): qty 1

## 2018-11-21 MED ORDER — INSULIN ASPART 100 UNIT/ML ~~LOC~~ SOLN
0.0000 [IU] | Freq: Every day | SUBCUTANEOUS | Status: DC
  Administered 2018-11-25: 3 [IU] via SUBCUTANEOUS
  Administered 2018-11-26: 4 [IU] via SUBCUTANEOUS
  Administered 2018-11-27 – 2018-11-28 (×2): 3 [IU] via SUBCUTANEOUS
  Administered 2018-11-30: 2 [IU] via SUBCUTANEOUS
  Filled 2018-11-21 (×5): qty 1

## 2018-11-21 MED ORDER — VANCOMYCIN HCL IN DEXTROSE 1-5 GM/200ML-% IV SOLN
1000.0000 mg | INTRAVENOUS | Status: DC
  Filled 2018-11-21: qty 200

## 2018-11-21 MED ORDER — ALLOPURINOL 100 MG PO TABS
100.0000 mg | ORAL_TABLET | Freq: Every day | ORAL | Status: DC
  Administered 2018-11-22 – 2018-12-01 (×9): 100 mg via ORAL
  Filled 2018-11-21 (×10): qty 1

## 2018-11-21 MED ORDER — INSULIN ASPART 100 UNIT/ML ~~LOC~~ SOLN
0.0000 [IU] | Freq: Three times a day (TID) | SUBCUTANEOUS | Status: DC
  Administered 2018-11-22 – 2018-11-23 (×2): 1 [IU] via SUBCUTANEOUS
  Administered 2018-11-23: 2 [IU] via SUBCUTANEOUS
  Administered 2018-11-24: 1 [IU] via SUBCUTANEOUS
  Administered 2018-11-25: 2 [IU] via SUBCUTANEOUS
  Administered 2018-11-25 (×2): 3 [IU] via SUBCUTANEOUS
  Administered 2018-11-26: 4 [IU] via SUBCUTANEOUS
  Administered 2018-11-26: 2 [IU] via SUBCUTANEOUS
  Administered 2018-11-27: 1 [IU] via SUBCUTANEOUS
  Administered 2018-11-27: 3 [IU] via SUBCUTANEOUS
  Administered 2018-11-27: 2 [IU] via SUBCUTANEOUS
  Administered 2018-11-28: 3 [IU] via SUBCUTANEOUS
  Administered 2018-11-28: 1 [IU] via SUBCUTANEOUS
  Administered 2018-11-28: 2 [IU] via SUBCUTANEOUS
  Administered 2018-11-29: 5 [IU] via SUBCUTANEOUS
  Administered 2018-11-29: 2 [IU] via SUBCUTANEOUS
  Administered 2018-11-29: 08:00:00 1 [IU] via SUBCUTANEOUS
  Administered 2018-11-30: 2 [IU] via SUBCUTANEOUS
  Administered 2018-11-30: 1 [IU] via SUBCUTANEOUS
  Administered 2018-11-30: 2 [IU] via SUBCUTANEOUS
  Filled 2018-11-21 (×21): qty 1

## 2018-11-21 MED ORDER — CARVEDILOL 3.125 MG PO TABS
3.1250 mg | ORAL_TABLET | Freq: Two times a day (BID) | ORAL | Status: DC
  Administered 2018-11-21 – 2018-12-01 (×20): 3.125 mg via ORAL
  Filled 2018-11-21 (×20): qty 1

## 2018-11-21 MED ORDER — VANCOMYCIN HCL 10 G IV SOLR
2000.0000 mg | Freq: Once | INTRAVENOUS | Status: DC
  Filled 2018-11-21: qty 2000

## 2018-11-21 MED ORDER — GABAPENTIN 100 MG PO CAPS
100.0000 mg | ORAL_CAPSULE | Freq: Three times a day (TID) | ORAL | Status: DC
  Administered 2018-11-21 – 2018-12-01 (×28): 100 mg via ORAL
  Filled 2018-11-21 (×28): qty 1

## 2018-11-21 MED ORDER — CLOPIDOGREL BISULFATE 75 MG PO TABS
75.0000 mg | ORAL_TABLET | Freq: Every day | ORAL | Status: DC
  Administered 2018-11-22 – 2018-12-01 (×9): 75 mg via ORAL
  Filled 2018-11-21 (×9): qty 1

## 2018-11-21 MED ORDER — SODIUM CHLORIDE 0.9 % IV SOLN
2.0000 g | INTRAVENOUS | Status: DC
  Administered 2018-11-22: 2 g via INTRAVENOUS
  Filled 2018-11-21 (×2): qty 2

## 2018-11-21 MED ORDER — SODIUM CHLORIDE 0.9 % IV SOLN
2.0000 g | Freq: Once | INTRAVENOUS | Status: AC
  Administered 2018-11-21: 2 g via INTRAVENOUS
  Filled 2018-11-21: qty 2

## 2018-11-21 NOTE — Progress Notes (Signed)
Pt admitted to rm 153. Rapid covid swab test transported to lab by RN. Pt transferred to 133 negative air pressure rm. Pt refuses bed alarm stating "If You don't turn it off I will." Educated. Pt educated on POC. Oriented to room. Dressing changed to wet to dry dressing per MD verbal. VSS. IV team will come to place IV when covid results back. Per pt he is a very difficult stick and from past experiences only wants IV team to place IV. Will continue to monitor.

## 2018-11-21 NOTE — Consult Note (Addendum)
Pharmacy Antibiotic Note  Paul [name]. is a 53 y.o. male admitted on 11/21/2018 with osteomyelitis/wound infection.  It was noted that the patient had pus coming out of his right heel. Pharmacy has been consulted for Vancomycin and Cefepime dosing.   I called the patient to obtain his weight, which is 204 lbs, since he will started on vancomycin. At that time, the patient reported that he does not want to be on vancomycin as he feels this was the cause of been on dialysis during a previous admission. Dr. Leslye Peer is aware of situation.   End-stage renal disease on hemodialysis Tuesday Thursday and Saturday.  Baseline Scr (mg/dL): 7.54 (10/10/18)  Scr (11/21/18) pending   Plan: Will order cefepime 2g x1 dose now. Will order cefepime 2 g 3x/ week after HD.   Will order Vancomycin loading dose of 2000 mg x1 dose. Vancomycin 1000 mg will be TThS.      Temp (24hrs), Avg:98.7 F (37.1 C), Min:98.7 F (37.1 C), Max:98.7 F (37.1 C)  No results for input(s): WBC, CREATININE, LATICACIDVEN, VANCOTROUGH, VANCOPEAK, VANCORANDOM, GENTTROUGH, GENTPEAK, GENTRANDOM, TOBRATROUGH, TOBRAPEAK, TOBRARND, AMIKACINPEAK, AMIKACINTROU, AMIKACIN in the last 168 hours.  CrCl cannot be calculated (Patient's most recent lab result is older than the maximum 21 days allowed.).    Allergies  Allergen Reactions  . Other Other (See Comments)    Cardiac Problems. Pt states he tolerates Toradol. Due to kidney and heart problems per pt  . Ibuprofen Other (See Comments)    Heart problems  . Baclofen Other (See Comments)  . Metformin Diarrhea  . Nsaids     Due to kidney and heart problems per pt    Antimicrobials this admission: 7/20 Cefepime >>  7/20 Vancomycin >>  Dose adjustments this admission: N/A  Microbiology results: N/A  Thank you for allowing pharmacy to be a part of this patient's care.  Rowland Lathe 11/21/2018 7:20 PM

## 2018-11-21 NOTE — H&P (Signed)
[place] [facility] [place] NAME: Paul [name]    MR#:  [id number]  DATE OF BIRTH:  08/10/1965  DATE OF ADMISSION:  11/21/2018  PRIMARY CARE PHYSICIAN: Tracie Harrier, MD   REQUESTING/REFERRING PHYSICIAN: Dr Elvina Mattes  CHIEF COMPLAINT:  Sent in for infection right heel  HISTORY OF PRESENT ILLNESS:  Paul [name]  is a 53 y.o. male with a known history of bilateral heel ulcerations.  The patient states that his left heel was healing well.  But his right heel started getting worse.  He stated he had a work 67 to 40 feet in order to get into the dialysis center and then another 20 feet in order to get into the chair.  He believes that this is part of the issue with his right heel.  He was seen last week by Dr. Elvina Mattes and he debrided quite a bit and wanted to send him in the hospital at that time but since the patient just bought groceries he did not come into the hospital at that time.  He was reevaluated today and sent in for direct admission for pus coming out of the right heel.  PAST MEDICAL HISTORY:   Past Medical History:  Diagnosis Date  . Anemia   . Cardiac defibrillator in place    a. Biotronik LUmax 540 DRT, (ser # [id number]).  . Carotid arterial disease ([place])    a. s/p prior LICA stenting;  b. 12/9379 Carotid U/S: 40-59% bilat ICA stenosis. Patent LICA stent.  . CHF (congestive heart failure) ([place])   . CKD (chronic kidney disease), stage III ([place])   . Coronary artery disease    a. 2010 s/p CABG x 3.  . DDD (degenerative disc disease), lumbosacral    L5-S1  . Diabetes ([place])    Lantus at bedtime  . Gangrene of toe of right foot ([place])   . Gout of left hand 10/06/2016  . HFrEF (heart failure with reduced ejection fraction) ([place])    a. 07/2016 Echo: EF 25-30%, diff HK, mild MR, mildly dil LA, mod reduced RV fxn, PASP 76mmHg.  [place] Hypertension    takes Coreg daily  . Ischemic cardiomyopathy    a. 07/2016 Echo: EF 25-30%, diff HK.  .  Myocardial infarction ([place]) 2009  . Peripheral vascular disease ([place])   . Sleep apnea    sleep study yr ago-unable to afford cpap  . Stroke [facility]) 09   no weakness    PAST SURGICAL HISTORY:   Past Surgical History:  Procedure Laterality Date  . AMPUTATION TOE Right 08/22/2016   Procedure: AMPUTATION TOE;  Surgeon: Samara Deist, DPM;  Location: [facility] ORS;  Service: Podiatry;  Laterality: Right;  . AMPUTATION TOE Right 10/09/2016   Procedure: AMPUTATION TOE-RIGHT 2ND MPJ;  Surgeon: Samara Deist, DPM;  Location: [facility] ORS;  Service: Podiatry;  Laterality: Right;  . APLIGRAFT PLACEMENT Right 10/09/2016   Procedure: APLIGRAFT PLACEMENT;  Surgeon: Samara Deist, DPM;  Location: [facility] ORS;  Service: Podiatry;  Laterality: Right;  . CALCANEAL OSTEOTOMY Bilateral 07/28/2018   Procedure: RIGHT CALCANECTOMY BILATERAL DEBRIDEMENT OF ULCERS ON HEELS;  Surgeon: Albertine Patricia, DPM;  Location: [facility] ORS;  Service: Podiatry;  Laterality: Bilateral;  . CARDIAC DEFIBRILLATOR PLACEMENT  2011  . CAROTID ENDARTERECTOMY Left   . CHOLECYSTECTOMY  2010  . CORONARY ARTERY BYPASS GRAFT  2010   CABG x 3   . DIALYSIS/PERMA CATHETER INSERTION N/A 10/07/2018   Procedure: DIALYSIS/PERMA CATHETER INSERTION;  Surgeon: Katha Cabal,  MD;  Location: [place] CV LAB;  Service: Cardiovascular;  Laterality: N/A;  . GRAFT APPLICATION Right 01/02/5175   Procedure: FULL THICKNESS SKIN GRAFT-RIGHT FOOT;  Surgeon: Algernon Huxley, MD;  Location: [facility] ORS;  Service: Vascular;  Laterality: Right;  . HIP SURGERY Right 1994  . INCISION AND DRAINAGE Right 09/08/2018   Procedure: INCISION AND DRAINAGE - [place] OF DEFECTIVE SKIN, SOFT TISSUE AND BONE;  Surgeon: Albertine Patricia, DPM;  Location: [facility] ORS;  Service: Podiatry;  Laterality: Right;  . INCISION AND DRAINAGE OF WOUND Right 08/22/2016   Procedure: IRRIGATION AND DEBRIDEMENT WOUND and wound vac placement;  Surgeon: Samara Deist, DPM;  Location: [facility] ORS;  Service:  Podiatry;  Laterality: Right;  . LOWER EXTREMITY ANGIOGRAPHY Right 08/24/2016   Procedure: Lower Extremity Angiography;  Surgeon: Algernon Huxley, MD;  Location: [place] CV LAB;  Service: Cardiovascular;  Laterality: Right;  . LOWER EXTREMITY ANGIOGRAPHY Right 07/27/2018   Procedure: RIGHT Lower Extremity Angiography;  Surgeon: Algernon Huxley, MD;  Location: [place] CV LAB;  Service: Cardiovascular;  Laterality: Right;  . LOWER EXTREMITY ANGIOGRAPHY Right 09/09/2018   Procedure: Lower Extremity Angiography;  Surgeon: Katha Cabal, MD;  Location: [place] CV LAB;  Service: Cardiovascular;  Laterality: Right;  . MASS EXCISION Right 07/18/2014   Procedure: EXCISION HETEROTOPIC BONE RIGHT HIP;  Surgeon: Frederik Pear, MD;  Location: [place];  Service: Orthopedics;  Laterality: Right;  . Open Heart Surgery  2010   x 3  . WOUND DEBRIDEMENT Right 10/09/2016   Procedure: DEBRIDEMENT WOUND;  Surgeon: Samara Deist, DPM;  Location: [facility] ORS;  Service: Podiatry;  Laterality: Right;    SOCIAL HISTORY:   Social History   Tobacco Use  . Smoking status: Never Smoker  . Smokeless tobacco: Never Used  Substance Use Topics  . Alcohol use: No    FAMILY HISTORY:   Family History  Problem Relation Age of Onset  . Hypertension Other   . Diabetes Other   . Diabetes Father   . Hyperlipidemia Father   . Hypertension Father   . Hyperlipidemia Sister   . Hypertension Sister   . Diabetes Sister   . Diabetes Brother   . Hypertension Brother   . Hyperlipidemia Brother     DRUG ALLERGIES:   Allergies  Allergen Reactions  . Other Other (See Comments)    Cardiac Problems. Pt states he tolerates Toradol. Due to kidney and heart problems per pt  . Ibuprofen Other (See Comments)    Heart problems  . Baclofen Other (See Comments)  . Metformin Diarrhea  . Nsaids     Due to kidney and heart problems per pt    REVIEW OF SYSTEMS:  CONSTITUTIONAL: Low-grade fever, no chills or sweats but  feels cold.  Some fatigue.  EYES: No blurred or double vision.  EARS, NOSE, AND THROAT: No tinnitus or ear pain. No sore throat RESPIRATORY: No cough, shortness of breath, wheezing or hemoptysis.  CARDIOVASCULAR: No chest pain, orthopnea, edema.  GASTROINTESTINAL: No nausea, vomiting,  or abdominal pain. No blood in bowel movements.  Chronic loose stools GENITOURINARY: No dysuria, hematuria.  ENDOCRINE: No polyuria, nocturia,  HEMATOLOGY: No anemia, easy bruising or bleeding SKIN: No rash or lesion. MUSCULOSKELETAL: Positive joint pain in the hands secondary to gout NEUROLOGIC: No tingling, numbness, weakness.  PSYCHIATRY: No anxiety or depression.   MEDICATIONS AT HOME:   Prior to Admission medications   Medication Sig Start Date End Date Taking? Authorizing Provider  allopurinol (ZYLOPRIM)  100 MG tablet Take 1 tablet (100 mg total) by mouth daily. 08/02/18   Fritzi Mandes, MD  amiodarone (PACERONE) 200 MG tablet Take 200 mg by mouth daily. Take 200 mg twice a day for ten days, then take 200 mg once a day thereafter. 01/15/18   [provider]  aspirin EC 81 MG tablet Take 1 tablet (81 mg total) by mouth daily. 10/21/16   Theora Gianotti, NP  carvedilol (COREG) 3.125 MG tablet Take 1 tablet (3.125 mg total) by mouth 2 (two) times daily. 05/29/18   Loletha Grayer, MD  Cholecalciferol (VITAMIN D3) 1.25 MG (50000 UT) CAPS Take 1 capsule by mouth once a week. 07/08/18   [provider]  clopidogrel (PLAVIX) 75 MG tablet Take 1 tablet (75 mg total) by mouth daily with breakfast. 09/12/18   Mayo, Pete Pelt, MD  dicyclomine (BENTYL) 10 MG capsule Take 1 capsule (10 mg total) by mouth 4 (four) times daily -  before meals and at bedtime. 10/11/18   Loletha Grayer, MD  gabapentin (NEURONTIN) 100 MG capsule Take 1 capsule (100 mg total) by mouth 3 (three) times daily. Patient not taking: Reported on 10/31/2018 10/11/18   Loletha Grayer, MD  hydrocerin (EUCERIN) CREA 1 application  daily to bilateral heels Patient not taking: Reported on 10/31/2018 10/11/18   Loletha Grayer, MD  HYDROcodone-acetaminophen (NORCO/VICODIN) 5-325 MG tablet Take 1 tablet by mouth 2 (two) times a day. Patient not taking: Reported on 10/13/2018 10/11/18   Loletha Grayer, MD  loperamide (IMODIUM) 2 MG capsule Take 1 capsule (2 mg total) by mouth every 8 (eight) hours as needed for diarrhea or loose stools. Patient not taking: Reported on 10/31/2018 10/11/18   Loletha Grayer, MD  predniSONE (DELTASONE) 5 MG tablet Take 1 tablet (5 mg total) by mouth daily with breakfast. 08/02/18   Fritzi Mandes, MD  torsemide (DEMADEX) 20 MG tablet Take 40 mg (two tablets) on days that you do not have dialysis Patient not taking: Reported on 10/31/2018 10/11/18   Loletha Grayer, MD      VITAL SIGNS:  Blood pressure 138/81, pulse 84, temperature 98.7 F (37.1 C), temperature source Oral, resp. rate 20, SpO2 96 %.  PHYSICAL EXAMINATION:  GENERAL:  53 y.o.-year-old patient lying in the bed with no acute distress.  EYES: Pupils equal, round, reactive to light and accommodation. No scleral icterus. Extraocular muscles intact.  HEENT: Head atraumatic, normocephalic. Oropharynx and nasopharynx clear.  NECK:  Supple, no jugular venous distention. No thyroid enlargement, no tenderness.  LUNGS: Normal breath sounds bilaterally, no wheezing, rales,rhonchi or crepitation. No use of accessory muscles of respiration.  CARDIOVASCULAR: S1, S2 normal. No murmurs, rubs, or gallops.  ABDOMEN: Soft, nontender, nondistended. Bowel sounds present. No organomegaly or mass.  EXTREMITIES: No pedal edema, cyanosis, or clubbing.  NEUROLOGIC: Cranial nerves II through XII are intact. Muscle strength 5/5 in all extremities. Sensation intact. Gait not checked.  PSYCHIATRIC: The patient is alert and oriented x 3.  SKIN: Large ulcer right heel with necrotic material.  Left heel ulcer healing well and no signs of infection  LABORATORY PANEL:    Laboratory data ordered by me   EKG:   Ordered by me  IMPRESSION AND PLAN:   1.  Right heel osteomyelitis.  Start empiric antibiotics.  Send off a sedimentation rate.  Dr. Elvina Mattes to follow-up.  Patient not interested in BKA at this time. 2.  End-stage renal disease on hemodialysis Tuesday Thursday and Saturday.  Get nephrology consultation. 3.  Paroxysmal atrial fibrillation.  Hold Plavix since the patient may end up needing her procedure.  Continue aspirin. 4.  Chronic systolic congestive heart failure.  Dialysis to manage fluid 5.  Gout bilateral hands continue prednisone 10 mg and allopurinol low-dose 6.  Type 2 diabetes mellitus on diet control only. 7.  Chronic loose stools on Bentyl   All the records are reviewed and case discussed with ED provider. Management plans discussed with the patient, and he is in agreement.  CODE STATUS: Full code  TOTAL TIME TAKING CARE OF THIS PATIENT: 50 minutes.    Loletha Grayer M.D on 11/21/2018 at 6:40 PM  Between 7am to 6pm - Pager - [phone]  After 6pm call admission pager [phone]  [facility] Office  [phone]  CC: Primary care physician; Tracie Harrier, MD

## 2018-11-22 DIAGNOSIS — Z992 Dependence on renal dialysis: Secondary | ICD-10-CM

## 2018-11-22 DIAGNOSIS — N189 Chronic kidney disease, unspecified: Secondary | ICD-10-CM

## 2018-11-22 DIAGNOSIS — Z89421 Acquired absence of other right toe(s): Secondary | ICD-10-CM

## 2018-11-22 DIAGNOSIS — Z95810 Presence of automatic (implantable) cardiac defibrillator: Secondary | ICD-10-CM

## 2018-11-22 DIAGNOSIS — Z96641 Presence of right artificial hip joint: Secondary | ICD-10-CM

## 2018-11-22 DIAGNOSIS — I2510 Atherosclerotic heart disease of native coronary artery without angina pectoris: Secondary | ICD-10-CM

## 2018-11-22 DIAGNOSIS — Z951 Presence of aortocoronary bypass graft: Secondary | ICD-10-CM

## 2018-11-22 DIAGNOSIS — E11621 Type 2 diabetes mellitus with foot ulcer: Secondary | ICD-10-CM

## 2018-11-22 DIAGNOSIS — D631 Anemia in chronic kidney disease: Secondary | ICD-10-CM

## 2018-11-22 DIAGNOSIS — L97414 Non-pressure chronic ulcer of right heel and midfoot with necrosis of bone: Secondary | ICD-10-CM

## 2018-11-22 DIAGNOSIS — N179 Acute kidney failure, unspecified: Secondary | ICD-10-CM

## 2018-11-22 DIAGNOSIS — Z89411 Acquired absence of right great toe: Secondary | ICD-10-CM

## 2018-11-22 DIAGNOSIS — I129 Hypertensive chronic kidney disease with stage 1 through stage 4 chronic kidney disease, or unspecified chronic kidney disease: Secondary | ICD-10-CM

## 2018-11-22 DIAGNOSIS — Z888 Allergy status to other drugs, medicaments and biological substances status: Secondary | ICD-10-CM

## 2018-11-22 DIAGNOSIS — Z7952 Long term (current) use of systemic steroids: Secondary | ICD-10-CM

## 2018-11-22 DIAGNOSIS — M869 Osteomyelitis, unspecified: Secondary | ICD-10-CM

## 2018-11-22 DIAGNOSIS — E1169 Type 2 diabetes mellitus with other specified complication: Principal | ICD-10-CM

## 2018-11-22 DIAGNOSIS — E1122 Type 2 diabetes mellitus with diabetic chronic kidney disease: Secondary | ICD-10-CM

## 2018-11-22 DIAGNOSIS — Z8614 Personal history of Methicillin resistant Staphylococcus aureus infection: Secondary | ICD-10-CM

## 2018-11-22 DIAGNOSIS — Z886 Allergy status to analgesic agent status: Secondary | ICD-10-CM

## 2018-11-22 DIAGNOSIS — L97429 Non-pressure chronic ulcer of left heel and midfoot with unspecified severity: Secondary | ICD-10-CM

## 2018-11-22 DIAGNOSIS — Z8619 Personal history of other infectious and parasitic diseases: Secondary | ICD-10-CM

## 2018-11-22 LAB — GLUCOSE, CAPILLARY
Glucose-Capillary: 167 mg/dL — ABNORMAL HIGH (ref 70–99)
Glucose-Capillary: 90 mg/dL (ref 70–99)
Glucose-Capillary: 95 mg/dL (ref 70–99)
Glucose-Capillary: 140 mg/dL — ABNORMAL HIGH (ref 70–99)
Glucose-Capillary: 112 mg/dL — ABNORMAL HIGH (ref 70–99)

## 2018-11-22 LAB — MRSA PCR SCREENING: MRSA by PCR: POSITIVE — AB

## 2018-11-22 LAB — PHOSPHORUS: Phosphorus: 5.5 mg/dL — ABNORMAL HIGH (ref 2.5–4.6)

## 2018-11-22 MED ORDER — PIPERACILLIN-TAZOBACTAM 3.375 G IVPB
3.3750 g | Freq: Two times a day (BID) | INTRAVENOUS | Status: DC
  Administered 2018-11-22 – 2018-11-29 (×15): 3.375 g via INTRAVENOUS
  Filled 2018-11-22 (×15): qty 50

## 2018-11-22 MED ORDER — EPOETIN ALFA 10000 UNIT/ML IJ SOLN
4000.0000 [IU] | INTRAMUSCULAR | Status: DC
  Administered 2018-11-24: 4000 [IU] via INTRAVENOUS
  Filled 2018-11-22 (×3): qty 1

## 2018-11-22 MED ORDER — LINEZOLID 600 MG PO TABS
600.0000 mg | ORAL_TABLET | Freq: Two times a day (BID) | ORAL | Status: DC
  Administered 2018-11-22 – 2018-12-01 (×17): 600 mg via ORAL
  Filled 2018-11-22 (×21): qty 1

## 2018-11-22 NOTE — Progress Notes (Signed)
Text paged doctor about MRSA PCR coming back positive

## 2018-11-22 NOTE — Progress Notes (Signed)
[facility]  ROUNDING NOTE   Subjective:  Patient well-known to [place] from prior admission. Seen and evaluated during hemodialysis treatment today. Tolerating well. Patient also has had purulent drainage from right foot. Dr. Elvina Mattes following.   Objective:  Vital signs in last 24 hours:  Temp:  [98.3 F (36.8 C)-99 F (37.2 C)] 98.3 F (36.8 C) (07/21 0915) Pulse Rate:  [65-89] 65 (07/21 1130) Resp:  [14-20] 16 (07/21 1130) BP: (116-154)/(62-98) 152/79 (07/21 1130) SpO2:  [96 %-100 %] 100 % (07/21 0915) Weight:  [93 kg-95 kg] 95 kg (07/21 0915)  Weight change:  Filed Weights   11/22/18 0124 11/22/18 0915  Weight: 93 kg 95 kg    Intake/Output: I/O last 3 completed shifts: In: 104 [IV Piggyback:104] Out: -    Intake/Output this shift:  No intake/output data recorded.  Physical Exam: General: No acute distress  Head: Normocephalic, atraumatic. Moist oral mucosal membranes  Eyes: Anicteric  Neck: Supple, trachea midline  Lungs:  Clear to auscultation, normal effort  Heart: S1S2 no rubs  Abdomen:  Soft, nontender, bowel sounds present  Extremities: 1+ LE edema, right foot wrapped  Neurologic: Awake, alert, following commands  Skin: No lesions  Access:  IJ PermCath.    Basic Metabolic Panel: Recent Labs  Lab 11/21/18 2114 11/22/18 0926  NA 135  --   K 5.6*  --   CL 100  --   CO2 20*  --   GLUCOSE 217*  --   BUN 53*  --   CREATININE 9.07*  --   CALCIUM 8.8*  --   PHOS  --  5.5*    Liver Function Tests: Recent Labs  Lab 11/21/18 2114  AST 18  ALT 14  ALKPHOS 108  BILITOT 0.6  PROT 7.9  ALBUMIN 3.5   No results for input(s): LIPASE, AMYLASE in the last 168 hours. No results for input(s): AMMONIA in the last 168 hours.  CBC: Recent Labs  Lab 11/21/18 2114  WBC 11.7*  HGB 9.5*  HCT 31.6*  MCV 91.3  PLT 269    Cardiac Enzymes: No results for input(s): CKTOTAL, CKMB, CKMBINDEX, TROPONINI in the last 168 hours.  BNP: Invalid  input(s): POCBNP  CBG: Recent Labs  Lab 11/21/18 2255 11/22/18 0812  GLUCAP 167* 90    Microbiology: Results for orders placed or performed during the hospital encounter of 11/21/18  SARS Coronavirus 2 (CEPHEID - Performed in [place] hospital lab), Hosp Order     Status: None   Collection Time: 11/21/18  6:42 PM   Specimen: Nasopharyngeal Swab  Result Value Ref Range Status   SARS Coronavirus 2 NEGATIVE NEGATIVE Final    Comment: (NOTE) If result is NEGATIVE SARS-CoV-2 target nucleic acids are NOT DETECTED. The SARS-CoV-2 RNA is generally detectable in upper and lower  respiratory specimens during the acute phase of infection. The lowest  concentration of SARS-CoV-2 viral copies this assay can detect is 250  copies / mL. A negative result does not preclude SARS-CoV-2 infection  and should not be used as the sole basis for treatment or other  patient management decisions.  A negative result may occur with  improper specimen collection / handling, submission of specimen other  than nasopharyngeal swab, presence of viral mutation(s) within the  areas targeted by this assay, and inadequate number of viral copies  (<250 copies / mL). A negative result must be combined with clinical  observations, patient history, and epidemiological information. If result is POSITIVE SARS-CoV-2 target  nucleic acids are DETECTED. The SARS-CoV-2 RNA is generally detectable in upper and lower  respiratory specimens dur ing the acute phase of infection.  Positive  results are indicative of active infection with SARS-CoV-2.  Clinical  correlation with patient history and other diagnostic information is  necessary to determine patient infection status.  Positive results do  not rule out bacterial infection or co-infection with other viruses. If result is PRESUMPTIVE POSTIVE SARS-CoV-2 nucleic acids MAY BE PRESENT.   A presumptive positive result was obtained on the submitted specimen  and  confirmed on repeat testing.  While 08/20/17 novel coronavirus  (SARS-CoV-2) nucleic acids may be present in the submitted sample  additional confirmatory testing may be necessary for epidemiological  and / or clinical management purposes  to differentiate between  SARS-CoV-2 and other Sarbecovirus currently known to infect humans.  If clinically indicated additional testing with an alternate test  methodology [id number]) is advised. The SARS-CoV-2 RNA is generally  detectable in upper and lower respiratory sp ecimens during the acute  phase of infection. The expected result is Negative. Fact Sheet for Patients:  StrictlyIdeas.no Fact Sheet for Healthcare Providers: BankingDealers.co.za This test is not yet approved or cleared by the [place] FDA and has been authorized for detection and/or diagnosis of SARS-CoV-2 by FDA under an Emergency Use Authorization (EUA).  This EUA will remain in effect (meaning this test can be used) for the duration of the COVID-19 declaration under Section 564(b)(1) of the Act, 21 U.S.C. section 360bbb-3(b)(1), unless the authorization is terminated or revoked sooner. Performed at [facility], [address] [place], [place] [address]     Coagulation Studies: Recent Labs    11/21/18 08/20/12  LABPROT 13.7  INR 1.1    Urinalysis: No results for input(s): COLORURINE, LABSPEC, PHURINE, GLUCOSEU, HGBUR, BILIRUBINUR, KETONESUR, PROTEINUR, UROBILINOGEN, NITRITE, LEUKOCYTESUR in the last 72 hours.  Invalid input(s): APPERANCEUR    Imaging: Ct Foot Right Wo Contrast  Result Date: 11/22/2018 CLINICAL DATA:  Chronic right heel ulceration in a diabetic patient. Question osteomyelitis. EXAM: CT OF THE RIGHT FOOT WITHOUT CONTRAST TECHNIQUE: Multidetector CT imaging of the right foot was performed according to the standard protocol. Multiplanar CT image reconstructions were also generated. COMPARISON:  CT  right ankle 09/09/2018. Plain films right foot 11/21/2018. FINDINGS: Bones/Joint/Cartilage The patient is status post distal transmetatarsal amputation as seen on prior plain films. Since the prior CT scan, the patient has undergone resection of the posterior calcaneus for osteomyelitis. Bones are osteopenic. No bony destructive change or periosteal reaction is identified. Ligaments Suboptimally assessed by CT. Muscles and Tendons No intramuscular fluid collection. Severe fatty atrophy of intrinsic musculature the foot is noted. Soft tissues A large skin wound on the plantar surface of the foot extends almost to the calcaneus. There is some subcutaneous edema about the foot. A small skin wound is seen over the lateral aspect of the distal fifth metatarsal. Small locules of gas are seen in the posterior soft tissues of the foot. No abscess is identified. IMPRESSION: Large skin wound on the plantar surface of the foot extends almost to the calcaneus. No CT evidence of osteomyelitis is present. Second skin wound over the heel is identified. Small skin wound over the distal fifth metatarsal without evidence of osteomyelitis. 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 Electronically Signed   By: Inge Rise M.D.   On: 11/22/2018 07:48   Dg Foot 2 Views Right  Result Date: 11/21/2018 CLINICAL DATA:  Osteomyelitis. EXAM: RIGHT FOOT - 2 VIEW COMPARISON:  Radiograph 10/05/2018 FINDINGS: Transmetatarsal amputation of the third through fifth rays, prior first and second digit resection. Resection margins are smooth. Possible soft tissue defect about the distal stump versus edema. Progressive soft tissue defect overlying the calcaneus with slight increased bony resorption and fragmentation. Pes planus with underlying osteopenia, no additional findings of osteomyelitis. Advanced vascular calcifications. Generalized soft tissue edema. IMPRESSION: Progressive soft tissue defect  overlying the calcaneus with slight increased bony resorption and fragmentation, concerning for osteomyelitis. Electronically Signed   By: Keith Rake M.D.   On: 11/21/2018 21:58     Medications:   . ceFEPime (MAXIPIME) IV    . vancomycin    . vancomycin Stopped (11/22/18 1200)   . allopurinol  100 mg Oral Daily  . amiodarone  200 mg Oral Daily  . aspirin EC  81 mg Oral Daily  . carvedilol  3.125 mg Oral BID  . cholecalciferol  5,000 Units Oral Weekly  . clopidogrel  75 mg Oral Q breakfast  . dicyclomine  10 mg Oral TID AC & HS  . gabapentin  100 mg Oral TID  . insulin aspart  0-5 Units Subcutaneous QHS  . insulin aspart  0-9 Units Subcutaneous TID WC  . predniSONE  10 mg Oral Q breakfast   HYDROcodone-acetaminophen, loperamide  Assessment/ Plan:  53 y.o. male  with type 2 diabetes, diabetic neuropathy, hypertension, coronary disease, congestive heart failure with LVEF of 25 to 30% in January 5277, grade 1 diastolic dysfunction, ICD, history of CABG in 2010, carotid stenosis, stroke, obstructive sleep apnea, peripheral vascular disease, gout, osteomyelitis of heel  [facility] 2nd  1.  ESRD on HD TTS.  Patient due for hemodialysis today.  Orders have been prepared.  He is seen and evaluated during dialysis treatment.  He does have some questions regarding his dry weight.  He would like this to be reduced to 85 kg however we explained that this would not be immediately possible.  UF target increased to 3 kg today.  2.  Hyperkalemia.  Serum potassium 5.6.  Patient placed on 2K bath.  3.  Secondary hyperparathyroidism.  Phosphorus currently 5.5 and within target range.  Continue to monitor.  4.  Hypertension.  Maintain the patient on carvedilol 3.125 mg p.o. twice daily.  5.  Anemia of chronic kidney disease.  Hemoglobin 9.5.  Start the patient on Epogen 4000 units IV with dialysis.   LOS: 1 Taygen Newsome 7/21/202011:42 AM

## 2018-11-22 NOTE — TOC Benefit Eligibility Note (Signed)
Transition of Care [facility]) Benefit Eligibility Note    Patient Details  Name: Paul [name]. MRN: [id number] Date of Birth: Dec 07, 1965  Medicare AB Available Days - As of Last Billing Date: 10/11/2018  Medicare Part A entitlement began on: 09/02/2010 Full Hospital Days Available: [place] Available: 30 Full SNF Days Available: 0 Co-SNF Days Available: 2 [facility] Days: 60 Psych Days Available: 190 Met towards PT Cap: $0.00 Met towards OT Cap: $0.00 Pints Remaining to meet blood deductible: 0.0    Dannette Barbara Phone Number: [phone] or [phone] 11/22/2018, 3:25 PM

## 2018-11-22 NOTE — TOC Progression Note (Signed)
Transition of Care (TOC) - Progression Note    Patient Details  Name: Paul [name]. MRN: [id number] Date of Birth: 1965/09/24  Transition of Care [facility]) CM/SW Contact  [name], RN Phone Number: 11/22/2018, 2:09 PM  Clinical Narrative:     Damaris Schooner with [name] the Dialysis coordinator, she is familiar with the patient I reached out to Financial department and left a VM for Amy Shepard requesting that she reach out and help the patient apply for Medicaid Dr Elvina Mattes explaines that the patient will need to go to rehab, the patient states he is out of days.  I explained to DrTtroxler that after 100 days medicare does not pay for rehab until the patient has a 60 day break, I explained that Medicaid pays for long term care npt Medicare.  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  I explained that I would have financial services reach out to help the patient apply for Medicaid. I requested a benefit check be done by CMA to determine how many days the patient has used for SNF to this point without a break       Expected Discharge Plan and Services                                                 Social Determinants of Health (SDOH) Interventions    Readmission Risk Interventions Readmission Risk Prevention Plan 10/06/2018 09/09/2018 08/02/2018  Transportation Screening Complete Complete Complete  PCP or Specialist Appt within 3-5 Days Complete Complete -  HRI or Home Care Consult Complete Complete Complete  Social Work Consult for [place] Planning/Counseling Complete Not Complete Complete  SW consult not completed comments - NA -  Palliative Care Screening Not Applicable Not Applicable Not Applicable  Medication Review (RN Care Manager) - - Complete  Some recent data might be hidden

## 2018-11-22 NOTE — Progress Notes (Addendum)
Hemodialysis initiated via R Chest hd catheter without issue. No heparin treatment. Patient is irritable this morning. Not happy with food or dialysis prescription. Refusing scheduled vancomycin.  Dr. Holley Raring notified at rounds. All vitals stable. Given additional blankets. Continue to monitor.

## 2018-11-22 NOTE — TOC Initial Note (Signed)
Transition of Care (TOC) - Initial/Assessment Note    Patient Details  Name: Paul [name]. MRN: [id number] Date of Birth: 08/10/1965  Transition of Care [facility]) CM/SW Contact:    [name], RN Phone Number: 11/22/2018, 2:43 PM  Clinical Narrative:                 Spoke with the patient about DC plan and needs He stated that he has not been in a rehab since Jan 2020.  He has had hospital stays but not rehab. He asked how much the copays are I explained that the first 20 days are free, he stated that 20 days would not be enough and that he would need to know how much his copay days are.  He gave permission to start the bed search and complete the FL2 and PASSR, we will review the bed choices once obtained.   Expected Discharge Plan: Skilled Nursing Facility Barriers to Discharge: Continued Medical Work up   Patient Goals and CMS Choice Patient states their goals for this hospitalization and ongoing recovery are:: go to rehab CMS Medicare.gov Compare Post Acute Care list provided to:: Patient    Expected Discharge Plan and Services Expected Discharge Plan: [place]   Discharge Planning Services: CM Consult   Living arrangements for the past 2 months: [place]                 DME Arranged: N/A                    Prior Living Arrangements/Services Living arrangements for the past 2 months: [place] with:: Facility Resident(lives in a boarding house as a resident) Patient language and need for interpreter reviewed:: No Do you feel safe going back to the place where you live?: No   unable to care for self  Need for Family Participation in Patient Care: No (Comment) Care giver support system in place?: No (comment) Current home services: DME(rollator) Criminal Activity/Legal Involvement Pertinent to Current Situation/Hospitalization: No - Comment as needed  Activities of Daily Living Home Assistive Devices/Equipment: Walker  (specify type)(rolling) ADL Screening (condition at time of admission) Patient's cognitive ability adequate to safely complete daily activities?: Yes Is the patient deaf or have difficulty hearing?: Yes Does the patient have difficulty seeing, even when wearing glasses/contacts?: Yes Does the patient have difficulty concentrating, remembering, or making decisions?: Yes Patient able to express need for assistance with ADLs?: Yes Does the patient have difficulty dressing or bathing?: No Independently performs ADLs?: Yes (appropriate for developmental age) Does the patient have difficulty walking or climbing stairs?: Yes Weakness of Legs: Both Weakness of Arms/Hands: Both  Permission Sought/Granted Permission sought to share information with : Case Manager Permission granted to share information with : Yes, Verbal Permission Granted     Permission granted to share info w AGENCY: any SNF        Emotional Assessment Appearance:: Appears stated age Attitude/Demeanor/Rapport: Engaged Affect (typically observed): Defensive Orientation: : Oriented to Self, Oriented to Place, Oriented to  Time, Oriented to Situation Alcohol / Substance Use: Not Applicable Psych Involvement: No (comment)  Admission diagnosis:  Osteomyelitis rt foot Patient Active Problem List   Diagnosis Date Noted  . AKI (acute kidney injury) ([place]) 10/04/2018  . Acute osteomyelitis of right ankle or foot ([place]) 09/06/2018  . Pressure ulcer of heel, right, unstageable ([place]) 09/06/2018  . Acute osteomyelitis of right foot ([place]) 07/25/2018  . Osteomyelitis ([place]) 07/25/2018  .  Anasarca 05/25/2018  . Pressure injury of skin 05/25/2018  . CKD (chronic kidney disease), stage IV ([place]) 12/15/2016  . Ulcerated, foot ([place]) 12/15/2016  . Gangrene of foot ([place]) 08/21/2016  . HTN (hypertension) 08/21/2016  . Diabetes ([place]) 08/21/2016  . CAD (coronary artery disease) 08/21/2016  . Chronic systolic CHF (congestive heart failure)  ([place]) 08/21/2016  . Diabetic foot infection ([place]) 08/21/2016  . Heterotopic ossification of bone 07/12/2014   PCP:  Tracie Harrier, MD Pharmacy:   [facility] [address] [place] - [place] [address] [place] [address] Phone: [phone] Fax: [phone]     Social Determinants of Health (SDOH) Interventions    Readmission Risk Interventions Readmission Risk Prevention Plan 10/06/2018 09/09/2018 08/02/2018  Transportation Screening Complete Complete Complete  PCP or Specialist Appt within 3-5 Days Complete Complete -  HRI or [place] Complete Complete Complete  Social Work Consult for [place] Planning/Counseling Complete Not Complete Complete  SW consult not completed comments - NA -  Palliative Care Screening Not Applicable Not Applicable Not Applicable  Medication Review (RN Care Manager) - - Complete  Some recent data might be hidden

## 2018-11-22 NOTE — Progress Notes (Signed)
Spoke with MD Anselm Jungling about epogen not being given and he stated that he would speak to nephrologist

## 2018-11-22 NOTE — Progress Notes (Addendum)
Pt refuses to take iv Vancomycin. Patient states that he "will not take the vancomycin because it killed his kidneys". Provided education to patient. MD notified, no new orders.

## 2018-11-22 NOTE — Progress Notes (Signed)
[facility] [place] NAME: Paul [name]    MR#:  [id number]  DATE OF BIRTH:  1965-10-20  SUBJECTIVE:  CHIEF COMPLAINT:  No chief complaint on file.  Sent from podiatry clinic with infection on the foot.  Seen during dialysis.  Have no new complaints.  Was complaining about his diabetic diet and requesting to have regular diet so he can have more options. REVIEW OF SYSTEMS:  CONSTITUTIONAL: No fever, fatigue or weakness.  EYES: No blurred or double vision.  EARS, NOSE, AND THROAT: No tinnitus or ear pain.  RESPIRATORY: No cough, shortness of breath, wheezing or hemoptysis.  CARDIOVASCULAR: No chest pain, orthopnea, edema.  GASTROINTESTINAL: No nausea, vomiting, diarrhea or abdominal pain.  GENITOURINARY: No dysuria, hematuria.  ENDOCRINE: No polyuria, nocturia,  HEMATOLOGY: No anemia, easy bruising or bleeding SKIN: No rash or lesion. MUSCULOSKELETAL: No joint pain or arthritis.   NEUROLOGIC: No tingling, numbness, weakness.  PSYCHIATRY: No anxiety or depression.   ROS  DRUG ALLERGIES:   Allergies  Allergen Reactions  . Other Other (See Comments)    Cardiac Problems. Pt states he tolerates Toradol. Due to kidney and heart problems per pt  . Ibuprofen Other (See Comments)    Heart problems  . Baclofen Other (See Comments)  . Metformin Diarrhea  . Nsaids     Due to kidney and heart problems per pt    VITALS:  Blood pressure 124/73, pulse 70, temperature 98.2 F (36.8 C), temperature source Oral, resp. rate 13, height 5\' 10"  (1.778 m), weight 92.5 kg, SpO2 100 %.  PHYSICAL EXAMINATION:  GENERAL:  53 y.o.-year-old patient lying in the bed with no acute distress.  EYES: Pupils equal, round, reactive to light and accommodation. No scleral icterus. Extraocular muscles intact.  HEENT: Head atraumatic, normocephalic. Oropharynx and nasopharynx clear.  NECK:  Supple, no jugular venous distention. No thyroid enlargement, no tenderness.   LUNGS: Normal breath sounds bilaterally, no wheezing, rales,rhonchi or crepitation. No use of accessory muscles of respiration.  CARDIOVASCULAR: S1, S2 normal. No murmurs, rubs, or gallops.  ABDOMEN: Soft, nontender, nondistended. Bowel sounds present. No organomegaly or mass.  EXTREMITIES: No pedal edema, cyanosis, or clubbing.  NEUROLOGIC: Cranial nerves II through XII are intact. Muscle strength 5/5 in all extremities. Sensation intact. Gait not checked.  PSYCHIATRIC: The patient is alert and oriented x 3.  SKIN: Large ulcer right heel with necrotic material.  Left heel ulcer healing well and no signs of infection  Physical Exam LABORATORY PANEL:   CBC Recent Labs  Lab 11/21/18 2114  WBC 11.7*  HGB 9.5*  HCT 31.6*  PLT 269   ------------------------------------------------------------------------------------------------------------------  Chemistries  Recent Labs  Lab 11/21/18 2114  NA 135  K 5.6*  CL 100  CO2 20*  GLUCOSE 217*  BUN 53*  CREATININE 9.07*  CALCIUM 8.8*  AST 18  ALT 14  ALKPHOS 108  BILITOT 0.6   ------------------------------------------------------------------------------------------------------------------  Cardiac Enzymes No results for input(s): TROPONINI in the last 168 hours. ------------------------------------------------------------------------------------------------------------------  RADIOLOGY:  Ct Foot Right Wo Contrast  Result Date: 11/22/2018 CLINICAL DATA:  Chronic right heel ulceration in a diabetic patient. Question osteomyelitis. EXAM: CT OF THE RIGHT FOOT WITHOUT CONTRAST TECHNIQUE: Multidetector CT imaging of the right foot was performed according to the standard protocol. Multiplanar CT image reconstructions were also generated. COMPARISON:  CT right ankle 09/09/2018. Plain films right foot 11/21/2018. FINDINGS: Bones/Joint/Cartilage The patient is status post distal transmetatarsal amputation as seen on prior plain  films.  Since the prior CT scan, the patient has undergone resection of the posterior calcaneus for osteomyelitis. Bones are osteopenic. No bony destructive change or periosteal reaction is identified. Ligaments Suboptimally assessed by CT. Muscles and Tendons No intramuscular fluid collection. Severe fatty atrophy of intrinsic musculature the foot is noted. Soft tissues A large skin wound on the plantar surface of the foot extends almost to the calcaneus. There is some subcutaneous edema about the foot. A small skin wound is seen over the lateral aspect of the distal fifth metatarsal. Small locules of gas are seen in the posterior soft tissues of the foot. No abscess is identified. IMPRESSION: Large skin wound on the plantar surface of the foot extends almost to the calcaneus. No CT evidence of osteomyelitis is present. Second skin wound over the heel is identified. Small skin wound over the distal fifth metatarsal without evidence of osteomyelitis. 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 Electronically Signed   By: Inge Rise M.D.   On: 11/22/2018 07:48   Dg Foot 2 Views Right  Result Date: 11/21/2018 CLINICAL DATA:  Osteomyelitis. EXAM: RIGHT FOOT - 2 VIEW COMPARISON:  Radiograph 10/05/2018 FINDINGS: Transmetatarsal amputation of the third through fifth rays, prior first and second digit resection. Resection margins are smooth. Possible soft tissue defect about the distal stump versus edema. Progressive soft tissue defect overlying the calcaneus with slight increased bony resorption and fragmentation. Pes planus with underlying osteopenia, no additional findings of osteomyelitis. Advanced vascular calcifications. Generalized soft tissue edema. IMPRESSION: Progressive soft tissue defect overlying the calcaneus with slight increased bony resorption and fragmentation, concerning for osteomyelitis. Electronically Signed   By: Keith Rake M.D.   On: 11/21/2018 21:58     ASSESSMENT AND PLAN:   Active Problems:   Osteomyelitis ([place])  1.  Right heel osteomyelitis. On empiric antibiotics.     Dr. Elvina Mattes to follow-up.  Patient not interested in BKA at this time. CT scan does not show osteomyelitis. Patient have MRSA positive on screening, ID to help decide antibiotics choice. 2.  End-stage renal disease on hemodialysis Tuesday Thursday and Saturday.    nephrology consultation. 3.  Paroxysmal atrial fibrillation.  Hold Plavix since the patient may end up needing her procedure.  Continue aspirin. 4.  Chronic systolic congestive heart failure.  Dialysis to manage fluid 5.  Gout bilateral hands continue prednisone 10 mg and allopurinol low-dose 6.  Type 2 diabetes mellitus on diet control only. 7.  Chronic loose stools on Bentyl    All the records are reviewed and case discussed with Care Management/Social Workerr. Management plans discussed with the patient, family and they are in agreement.  CODE STATUS: Full.  TOTAL TIME TAKING CARE OF THIS PATIENT: 35 minutes.     POSSIBLE D/C IN 1-2 DAYS, DEPENDING ON CLINICAL CONDITION.   Vaughan Basta M.D on 11/22/2018   Between 7am to 6pm - Pager - [phone]  After 6pm go to [email or web address] - password [email or web address] [facility]  Office  [phone]  CC: Primary care physician; Tracie Harrier, MD  Note: This dictation was prepared with Dragon dictation along with smaller phrase technology. Any transcriptional errors that result from this process are unintentional.

## 2018-11-22 NOTE — Progress Notes (Signed)
Hemodialysis completed without issue. Patient tolerated well. UF goal increased to 2.5L per Dr. Holley Raring. Goal met. Report called to primary RN.

## 2018-11-22 NOTE — NC FL2 (Signed)
[place] LEVEL OF CARE SCREENING TOOL     IDENTIFICATION  Patient Name: Paul [name]. Birthdate: [birth year] Sex: male Admission Date (Current Location): 11/21/2018  [place] and [place] Number:  Engineering geologist and Address:  [facility], [address] [place], [place] [address]      Provider Number: [id number]  Attending Physician Name and Address:  Vaughan Basta, *  Relative Name and Phone Number:  [place] [phone] brother    Current Level of Care: Hospital Recommended Level of Care: [place] Prior Approval Number:    Date Approved/Denied:   PASRR Number: [id number]  Discharge Plan: SNF    Current Diagnoses: Patient Active Problem List   Diagnosis Date Noted  . AKI (acute kidney injury) ([place]) 10/04/2018  . Acute osteomyelitis of right ankle or foot ([place]) 09/06/2018  . Pressure ulcer of heel, right, unstageable ([place]) 09/06/2018  . Acute osteomyelitis of right foot ([place]) 07/25/2018  . Osteomyelitis ([place]) 07/25/2018  . Anasarca 05/25/2018  . Pressure injury of skin 05/25/2018  . CKD (chronic kidney disease), stage IV ([place]) 12/15/2016  . Ulcerated, foot ([place]) 12/15/2016  . Gangrene of foot ([place]) 08/21/2016  . HTN (hypertension) 08/21/2016  . Diabetes ([place]) 08/21/2016  . CAD (coronary artery disease) 08/21/2016  . Chronic systolic CHF (congestive heart failure) ([place]) 08/21/2016  . Diabetic foot infection ([place]) 08/21/2016  . Heterotopic ossification of bone 07/12/2014    Orientation RESPIRATION BLADDER Height & Weight     Self, Time, Situation, Place  Normal Continent Weight: 92.5 kg Height:  5\' 10"  (177.8 cm)  BEHAVIORAL SYMPTOMS/MOOD NEUROLOGICAL BOWEL NUTRITION STATUS      Continent Diet  AMBULATORY STATUS COMMUNICATION OF NEEDS Skin   Limited Assist Verbally Normal, Surgical wounds                       Personal Care Assistance Level of Assistance  Dressing, Bathing  Bathing Assistance: Limited assistance   Dressing Assistance: Independent     Functional Limitations Info  Sight, Hearing, Speech Sight Info: Adequate Hearing Info: Adequate Speech Info: Adequate    SPECIAL CARE FACTORS FREQUENCY  PT (By licensed PT)     PT Frequency: 5 times per week              Contractures Contractures Info: Not present    Additional Factors Info  Code Status, Allergies Code Status Info: full code Allergies Info: Other, Ibuprofen, Baclofen, Metformin, Nsaids           Current Medications (11/22/2018):  This is the current hospital active medication list Current Facility-Administered Medications  Medication Dose Route Frequency Provider Last Rate Last Dose  . allopurinol (ZYLOPRIM) tablet 100 mg  100 mg Oral Daily Loletha Grayer, MD   100 mg at 11/22/18 0824  . amiodarone (PACERONE) tablet 200 mg  200 mg Oral Daily Loletha Grayer, MD   200 mg at 11/22/18 0824  . aspirin EC tablet 81 mg  81 mg Oral Daily Loletha Grayer, MD   81 mg at 11/22/18 8882  . carvedilol (COREG) tablet 3.125 mg  3.125 mg Oral BID Loletha Grayer, MD   3.125 mg at 11/22/18 0823  . ceFEPIme (MAXIPIME) 2 g in sodium chloride 0.9 % 100 mL IVPB  2 g Intravenous Once per day on Tue Thu Sat Rowland Lathe, RPH 200 mL/hr at 11/22/18 1440 2 g at 11/22/18 1440  . cholecalciferol (VITAMIN D) tablet 5,000 Units  5,000 Units  Oral Weekly Wieting, [name], MD      . clopidogrel (PLAVIX) tablet 75 mg  75 mg Oral Q breakfast Loletha Grayer, MD   75 mg at 11/22/18 0823  . dicyclomine (BENTYL) capsule 10 mg  10 mg Oral TID AC & HS Loletha Grayer, MD   10 mg at 11/22/18 1353  . epoetin alfa (EPOGEN) injection 4,000 Units  4,000 Units Intravenous Q T,Th,Sa-HD Lateef, [place], MD      . gabapentin (NEURONTIN) capsule 100 mg  100 mg Oral TID Loletha Grayer, MD   100 mg at 11/22/18 0824  . HYDROcodone-acetaminophen (NORCO/VICODIN) 5-325 MG per tablet 1 tablet  1 tablet Oral Q6H PRN  Loletha Grayer, MD   1 tablet at 11/22/18 1443  . insulin aspart (novoLOG) injection 0-5 Units  0-5 Units Subcutaneous QHS Wieting, [name], MD      . insulin aspart (novoLOG) injection 0-9 Units  0-9 Units Subcutaneous TID WC Wieting, [name], MD      . loperamide (IMODIUM) capsule 2 mg  2 mg Oral Q8H PRN Wieting, [name], MD      . predniSONE (DELTASONE) tablet 10 mg  10 mg Oral Q breakfast Loletha Grayer, MD   10 mg at 11/22/18 0825  . vancomycin (VANCOCIN) 2,000 mg in sodium chloride 0.9 % 500 mL IVPB  2,000 mg Intravenous Once Rowland Lathe, RPH      . vancomycin (VANCOCIN) IVPB 1000 mg/200 mL premix  1,000 mg Intravenous Q T,Th,Sa-HD Rowland Lathe, RPH   Stopped at 11/22/18 1200     Discharge Medications: Please see discharge summary for a list of discharge medications.  Relevant Imaging Results:  Relevant Lab Results:   Additional Information [id number]  [name], RN

## 2018-11-22 NOTE — Consult Note (Signed)
[facility]                                                      Patient Demographics  Paul [name], is a 53 y.o. male   MRN: [id number]   DOB - 1965/08/29  Admit Date - 11/21/2018    Outpatient Primary MD for the patient is Tracie Harrier, MD  Consult requested in the Hospital by Vaughan Basta, *, On 11/22/2018  With History of -  Past Medical History:  Diagnosis Date  . Anemia   . Cardiac defibrillator in place    a. Biotronik LUmax 540 DRT, (ser # [id number]).  . Carotid arterial disease ([place])    a. s/p prior LICA stenting;  b. 01/6788 Carotid U/S: 40-59% bilat ICA stenosis. Patent LICA stent.  . CHF (congestive heart failure) ([place])   . CKD (chronic kidney disease), stage III ([place])   . Coronary artery disease    a. 2010 s/p CABG x 3.  . DDD (degenerative disc disease), lumbosacral    L5-S1  . Diabetes ([place])    Lantus at bedtime  . Gangrene of toe of right foot ([place])   . Gout of left hand 10/06/2016  . HFrEF (heart failure with reduced ejection fraction) ([place])    a. 07/2016 Echo: EF 25-30%, diff HK, mild MR, mildly dil LA, mod reduced RV fxn, PASP 58mHg.  .Marland KitchenHypertension    takes Coreg daily  . Ischemic cardiomyopathy    a. 07/2016 Echo: EF 25-30%, diff HK.  . [place]  . Peripheral vascular disease ([place]   . Sleep apnea    sleep study yr ago-unable to afford cpap  . Stroke ([facility] 09   no weakness      Past Surgical History:  Procedure Laterality Date  . AMPUTATION TOE Right 08/22/2016   Procedure: AMPUTATION TOE;  Surgeon: JSamara Deist DPM;  Location: [facility] OR[facility];  Service: Podiatry;  Laterality: Right;  . AMPUTATION TOE Right 10/09/2016   Procedure: AMPUTATION TOE-RIGHT 2ND MPJ;  Surgeon: FSamara Deist DPM;  Location: [facility];  Service: Podiatry;  Laterality: Right;  . APLIGRAFT PLACEMENT Right 10/09/2016    Procedure: APLIGRAFT PLACEMENT;  Surgeon: FSamara Deist DPM;  Location: [facility];  Service: Podiatry;  Laterality: Right;  . CALCANEAL OSTEOTOMY Bilateral 07/28/2018   Procedure: RIGHT CALCANECTOMY BILATERAL DEBRIDEMENT OF ULCERS ON HEELS;  Surgeon: TAlbertine Patricia DPM;  Location: [facility] OR[facility];  Service: Podiatry;  Laterality: Bilateral;  . CARDIAC DEFIBRILLATOR PLACEMENT  2011  . CAROTID ENDARTERECTOMY Left   . CHOLECYSTECTOMY  2010  . CORONARY ARTERY BYPASS GRAFT  2010   CABG x 3   . DIALYSIS/PERMA CATHETER INSERTION N/A 10/07/2018   Procedure: DIALYSIS/PERMA CATHETER INSERTION;  Surgeon: SKatha Cabal MD;  [facility];  Service: Cardiovascular;  Laterality: N/A;  . GRAFT APPLICATION Right 83/81/0175  Procedure: FULL THICKNESS SKIN GRAFT-RIGHT FOOT;  Surgeon: DAlgernon Huxley MD;  Location: [facility];  Service: Vascular;  Laterality: Right;  . HIP SURGERY Right 1994  . INCISION AND DRAINAGE Right 09/08/2018   Procedure: INCISION AND DRAINAGE - DHarperOF DEFECTIVE SKIN, SOFT TISSUE AND BONE;  Surgeon: TAlbertine Patricia DPM;  Location: [facility];  Service: Podiatry;  Laterality: Right;  . INCISION AND DRAINAGE OF WOUND Right 08/22/2016   Procedure: IRRIGATION AND DEBRIDEMENT  WOUND and wound vac placement;  Surgeon: Samara Deist, DPM;  Location: [facility];  Service: Podiatry;  Laterality: Right;  . LOWER EXTREMITY ANGIOGRAPHY Right 08/24/2016   Procedure: Lower Extremity Angiography;  Surgeon: Algernon Huxley, MD;  Location: [place] [facility];  Service: Cardiovascular;  Laterality: Right;  . LOWER EXTREMITY ANGIOGRAPHY Right 07/27/2018   Procedure: RIGHT Lower Extremity Angiography;  Surgeon: Algernon Huxley, MD;  Location: [place] [facility];  Service: Cardiovascular;  Laterality: Right;  . LOWER EXTREMITY ANGIOGRAPHY Right 09/09/2018   Procedure: Lower Extremity Angiography;  Surgeon: Katha Cabal, MD;  Location: [place] [facility];  Service: Cardiovascular;  Laterality:  Right;  . MASS EXCISION Right 07/18/2014   Procedure: EXCISION HETEROTOPIC BONE RIGHT HIP;  Surgeon: Frederik Pear, MD;  Location: [place];  Service: Orthopedics;  Laterality: Right;  . Open Heart Surgery  2010   x 3  . WOUND DEBRIDEMENT Right 10/09/2016   Procedure: DEBRIDEMENT WOUND;  Surgeon: Samara Deist, DPM;  Location: [facility];  Service: Podiatry;  Laterality: Right;    in for   No chief complaint on file.    HPI  Paul [name]  is a 53 y.o. male, hospitalized with a plantar foot wound with infection to the plantar aspect of his right foot.  He been recuperating healing from posterior decubitus ulcers on both heels left heel healed up completely but the left heel involved resection of part of the heel bone as well as resection of the distal Achilles tendon as result of infection and necrosis.  He was unable to stay off of it completely due to having to walk to dialysis and also walk around his house to get things done take care of himself.  His health insurance has some unusual requirements about going to rehab and he cannot be placed last time he was hospitalized.  Subsequent ambulation caused a breakdown of skin on the plantar aspect of his foot.   Social History Social History   Tobacco Use  . Smoking status: Never Smoker  . Smokeless tobacco: Never Used  Substance Use Topics  . Alcohol use: No    Family History Family History  Problem Relation Age of Onset  . Hypertension Other   . Diabetes Other   . Diabetes Father   . Hyperlipidemia Father   . Hypertension Father   . Hyperlipidemia Sister   . Hypertension Sister   . Diabetes Sister   . Diabetes Brother   . Hypertension Brother   . Hyperlipidemia Brother     Prior to Admission medications   Medication Sig Start Date End Date Taking? Authorizing Provider  allopurinol (ZYLOPRIM) 100 MG tablet Take 1 tablet (100 mg total) by mouth daily. 08/02/18   Fritzi Mandes, MD  amiodarone (PACERONE) 200 MG tablet Take 200 mg by  mouth daily. Take 200 mg twice a day for ten days, then take 200 mg once a day thereafter. 01/15/18   [provider]  aspirin EC 81 MG tablet Take 1 tablet (81 mg total) by mouth daily. 10/21/16   Theora Gianotti, NP  carvedilol (COREG) 3.125 MG tablet Take 1 tablet (3.125 mg total) by mouth 2 (two) times daily. 05/29/18   Loletha Grayer, MD  Cholecalciferol (VITAMIN D3) 1.25 MG (50000 UT) CAPS Take 1 capsule by mouth once a week. 07/08/18   [provider]  clopidogrel (PLAVIX) 75 MG tablet Take 1 tablet (75 mg total) by mouth daily with breakfast. 09/12/18   Mayo, Pete Pelt,  MD  dicyclomine (BENTYL) 10 MG capsule Take 1 capsule (10 mg total) by mouth 4 (four) times daily -  before meals and at bedtime. 10/11/18   Loletha Grayer, MD  gabapentin (NEURONTIN) 100 MG capsule Take 1 capsule (100 mg total) by mouth 3 (three) times daily. Patient not taking: Reported on 10/31/2018 10/11/18   Loletha Grayer, MD  hydrocerin (EUCERIN) CREA 1 application daily to bilateral heels Patient not taking: Reported on 10/31/2018 10/11/18   Loletha Grayer, MD  HYDROcodone-acetaminophen (NORCO/VICODIN) 5-325 MG tablet Take 1 tablet by mouth 2 (two) times a day. Patient not taking: Reported on 10/13/2018 10/11/18   Loletha Grayer, MD  loperamide (IMODIUM) 2 MG capsule Take 1 capsule (2 mg total) by mouth every 8 (eight) hours as needed for diarrhea or loose stools. Patient not taking: Reported on 10/31/2018 10/11/18   Loletha Grayer, MD  predniSONE (DELTASONE) 5 MG tablet Take 1 tablet (5 mg total) by mouth daily with breakfast. 08/02/18   Fritzi Mandes, MD  torsemide (DEMADEX) 20 MG tablet Take 40 mg (two tablets) on days that you do not have dialysis Patient not taking: Reported on 10/31/2018 10/11/18   Loletha Grayer, MD    Anti-infectives (From admission, onward)   Start     Dose/Rate Route Frequency Ordered Stop   11/22/18 1200  vancomycin (VANCOCIN) IVPB 1000 mg/200 mL premix     1,000  mg 200 mL/hr over 60 Minutes Intravenous Every T-Th-Sa (Hemodialysis) 11/21/18 1955     11/22/18 1200  ceFEPIme (MAXIPIME) 2 g in sodium chloride 0.9 % 100 mL IVPB     2 g 200 mL/hr over 30 Minutes Intravenous Once per day on Tue Thu Sat 11/21/18 2007     11/21/18 2000  vancomycin (VANCOCIN) 2,000 mg in sodium chloride 0.9 % 500 mL IVPB     2,000 mg 250 mL/hr over 120 Minutes Intravenous  Once 11/21/18 1927     11/21/18 1900  ceFEPIme (MAXIPIME) 2 g in sodium chloride 0.9 % 100 mL IVPB     2 g 200 mL/hr over 30 Minutes Intravenous  Once 11/21/18 1856 11/21/18 2355      Scheduled Meds: . allopurinol  100 mg Oral Daily  . amiodarone  200 mg Oral Daily  . aspirin EC  81 mg Oral Daily  . carvedilol  3.125 mg Oral BID  . cholecalciferol  5,000 Units Oral Weekly  . clopidogrel  75 mg Oral Q breakfast  . dicyclomine  10 mg Oral TID AC & HS  . epoetin (EPOGEN/PROCRIT) injection  4,000 Units Intravenous Q T,Th,Sa-HD  . gabapentin  100 mg Oral TID  . insulin aspart  0-5 Units Subcutaneous QHS  . insulin aspart  0-9 Units Subcutaneous TID WC  . predniSONE  10 mg Oral Q breakfast   Continuous Infusions: . ceFEPime (MAXIPIME) IV    . vancomycin    . vancomycin Stopped (11/22/18 1200)   PRN Meds:.HYDROcodone-acetaminophen, loperamide  Allergies  Allergen Reactions  . Other Other (See Comments)    Cardiac Problems. Pt states he tolerates Toradol. Due to kidney and heart problems per pt  . Ibuprofen Other (See Comments)    Heart problems  . Baclofen Other (See Comments)  . Metformin Diarrhea  . Nsaids     Due to kidney and heart problems per pt    Physical Exam: He is alert and well-oriented today.  Vitals  Blood pressure 124/73, pulse 70, temperature 98.2 F (36.8 C), temperature source Oral, resp. rate 13, height  5' 10"  (1.778 m), weight 92.5 kg, SpO2 100 %.  Lower Extremity exam:  Vascular: Patient's had revascularization to both lower extremities.  Was progressing very  well with both wounds.  Dermatological: Left foot wound is completely healed at this point.  The right posterior heel is almost healed but he developed necrosis and ulceration on the plantar heel.  This wound is approximately 6 cm x 5 cm and unfortunately after debridement penetrates all the way down to plantar calcaneus.  Heavy.  Drainage is noted to the region as well.  Neurological: Peripheral neuropathy associated diabetes  Ortho: Partial calcanectomy right previous trans-met amputation right  Data Review  CBC Recent Labs  Lab 11/21/18 2114  WBC 11.7*  HGB 9.5*  HCT 31.6*  PLT 269  MCV 91.3  MCH 27.5  MCHC 30.1  RDW 17.2*   ------------------------------------------------------------------------------------------------------------------  Chemistries  Recent Labs  Lab 11/21/18 2114  NA 135  K 5.6*  CL 100  CO2 20*  GLUCOSE 217*  BUN 53*  CREATININE 9.07*  CALCIUM 8.8*  AST 18  ALT 14  ALKPHOS 108  BILITOT 0.6   ------------------------------------------------------------------------------------------------------------------ estimated creatinine clearance is 10.8 mL/min (A) (by C-G formula based on SCr of 9.07 mg/dL (H)). ------------------------------------------------------------------------------------------------------------------ No results for input(s): TSH, T4TOTAL, T3FREE, THYROIDAB in the last 72 hours.  Invalid input(s): FREET3 Urinalysis    Component Value Date/Time   COLORURINE YELLOW (A) 10/05/2018 1326   APPEARANCEUR HAZY (A) 10/05/2018 1326   LABSPEC 1.009 10/05/2018 1326   PHURINE 5.0 10/05/2018 1326   GLUCOSEU NEGATIVE 10/05/2018 1326   HGBUR SMALL (A) 10/05/2018 1326   BILIRUBINUR NEGATIVE 10/05/2018 1326   KETONESUR NEGATIVE 10/05/2018 1326   PROTEINUR 30 (A) 10/05/2018 1326   UROBILINOGEN 1.0 07/16/2014 1558   NITRITE NEGATIVE 10/05/2018 1326   LEUKOCYTESUR NEGATIVE 10/05/2018 1326     Imaging results:   Ct Foot Right Wo  Contrast  Result Date: 11/22/2018 CLINICAL DATA:  Chronic right heel ulceration in a diabetic patient. Question osteomyelitis. EXAM: CT OF THE RIGHT FOOT WITHOUT CONTRAST TECHNIQUE: Multidetector CT imaging of the right foot was performed according to the standard protocol. Multiplanar CT image reconstructions were also generated. COMPARISON:  CT right ankle 09/09/2018. Plain films right foot 11/21/2018. FINDINGS: Bones/Joint/Cartilage The patient is status post distal transmetatarsal amputation as seen on prior plain films. Since the prior CT scan, the patient has undergone resection of the posterior calcaneus for osteomyelitis. Bones are osteopenic. No bony destructive change or periosteal reaction is identified. Ligaments Suboptimally assessed by CT. Muscles and Tendons No intramuscular fluid collection. Severe fatty atrophy of intrinsic musculature the foot is noted. Soft tissues A large skin wound on the plantar surface of the foot extends almost to the calcaneus. There is some subcutaneous edema about the foot. A small skin wound is seen over the lateral aspect of the distal fifth metatarsal. Small locules of gas are seen in the posterior soft tissues of the foot. No abscess is identified. IMPRESSION: Large skin wound on the plantar surface of the foot extends almost to the calcaneus. No CT evidence of osteomyelitis is present. Second skin wound over the heel is identified. Small skin wound over the distal fifth metatarsal without evidence of osteomyelitis. 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 Electronically Signed   By: Inge Rise M.D.   On: 11/22/2018 07:48   Dg Foot 2 Views Right  Result Date: 11/21/2018 CLINICAL DATA:  Osteomyelitis. EXAM: RIGHT FOOT - 2 VIEW COMPARISON:  Radiograph 10/05/2018 FINDINGS: Transmetatarsal amputation of the third through fifth rays, prior first and second digit resection. Resection margins are smooth. Possible soft  tissue defect about the distal stump versus edema. Progressive soft tissue defect overlying the calcaneus with slight increased bony resorption and fragmentation. Pes planus with underlying osteopenia, no additional findings of osteomyelitis. Advanced vascular calcifications. Generalized soft tissue edema. IMPRESSION: Progressive soft tissue defect overlying the calcaneus with slight increased bony resorption and fragmentation, concerning for osteomyelitis. Electronically Signed   By: Keith Rake M.D.   On: 11/21/2018 21:58    Assessment & Plan: Wound developed because of his inability to stay off of it.  He had to walk when he got to the [place] he had a walk from his house to the car which was 530 or 40 yards on an uneven downhill surface when he had to go to dialysis plus he had to fend for himself around the house with meals and toiletries.  Subsequently this wound broke down and worsened and now has a large plantar ulceration.  The ulceration is entirely due to ambulation. I spoke with the patient at length today along with Dr. Tama High.  He wants to consider trying to clean it up but my concern is that he would do better with a below-knee amputation as far as returning to more active lifestyle and also having less future problems with this area.  The problem is that he is been unable to go to rehab due to restrictions from his insurance.  My concern is that this is not a feasible situation considering his problems and he will need rehab no matter what direction we go over this.  Dr. Tama High will try to get him started with IV antibiotics and I will see him tomorrow for further evaluation. Active Problems:   Osteomyelitis [facility])   Family Communication: Plan discussed with patient  Albertine Patricia M.D on 11/22/2018 at 1:31 PM  Thank you for the consult, we will follow the patient with you in the Hospital.

## 2018-11-22 NOTE — Consult Note (Signed)
NAME: Paul [name].  DOB: [birth year]  MRN: [id number]  Date/Time: 11/22/2018 4:06 PM  REQUESTING PROVIDER Dr. Elvina Mattes Subjective:  REASON FOR CONSULT: New ulcerating wound on the right foot ? Paul [name]. is a 53 y.o. 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  Patient has had infection of the right foot on the left for at least a year now.  He has had baseline kidney disease.  He has had MSSA infections and recently MRSA infection.  He has had IND and excision of the infected bone of his heels.  He has been on various courses of antibiotics with pretty much all the antibiotics causing some issues with the kidney.  Recently in May 2020 after a couple of days of vancomycin his levels went high and it was changed to daptomycin.  In spite of that his creatinine started to get worse.  There were also other contributing factors including being torsemide and  angiogram.  He eventually had to go on dialysis in June 2020 as his creatinine was more than 10. The right heel wound  healed and he completed antibiotics (daptomycin, ceftazidime and Flagyl on 10/10/2018).  But since he started  dialysis he has been bearing weight on that right foot to go to dialysis 3 times a week.  He also lives on his own and he says he drives to dialysis and he walks on uneven ground when getting out of his house to go to his car.  So putting pressure on his right foot caused another new necrotic plaque to come up in the midfoot.  He saw Dr. Elvina Mattes as outpatient and the first week of July he was noted to have this new block which was debrided.  Continue to get worse and he has been admitted to the hospital.  He denies any fever.  Patient says he is  unable to stay off his feet because of going to dialysis and also at home he has to take care of himself.  He is currently not on any antibiotics.  Past Medical History:  Diagnosis Date   Anemia    Cardiac defibrillator in place    a. Biotronik LUmax 540 DRT, (ser # [id number]).   Carotid arterial disease ([place])    a. s/p prior LICA stenting;  b. 0/1751 Carotid U/S: 40-59% bilat ICA stenosis. Patent LICA stent.   CHF (congestive heart failure) (HCC)    CKD (chronic kidney disease), stage III ([place])    Coronary artery disease    a. 2010 s/p CABG x 3.   DDD (degenerative disc disease), lumbosacral    L5-S1   Diabetes ([place])    Lantus at bedtime   Gangrene of toe of right foot ([place])    Gout of left hand 10/06/2016   HFrEF (heart failure with reduced ejection fraction) ([place])    a. 07/2016 Echo: EF 25-30%, diff HK, mild MR, mildly dil LA, mod reduced RV fxn, PASP 73mmHg.   Hypertension    takes Coreg daily   Ischemic cardiomyopathy    a. 07/2016 Echo: EF 25-30%, diff HK.   Myocardial infarction [facility]) 2009   Peripheral vascular disease ([place])    Sleep apnea    sleep study yr ago-unable  to afford cpap   Stroke [facility]) 09   no weakness    Past Surgical History:  Procedure Laterality Date   AMPUTATION TOE Right 08/22/2016   Procedure: AMPUTATION TOE;  Surgeon: Samara Deist, DPM;  Location: [facility] OR[facility];  Service: Podiatry;  Laterality: Right;   AMPUTATION TOE Right 10/09/2016   Procedure: AMPUTATION TOE-RIGHT 2ND MPJ;  Surgeon: Samara Deist, DPM;  Location: [facility];  Service: Podiatry;  Laterality: Right;   APLIGRAFT PLACEMENT Right 10/09/2016   Procedure: APLIGRAFT PLACEMENT;  Surgeon: Samara Deist, DPM;  Location: [facility];  Service: Podiatry;  Laterality: Right;   CALCANEAL OSTEOTOMY Bilateral 07/28/2018   Procedure: RIGHT CALCANECTOMY BILATERAL DEBRIDEMENT OF ULCERS ON HEELS;  Surgeon: Albertine Patricia, DPM;  Location: [facility];  Service: Podiatry;  Laterality: Bilateral;     CARDIAC DEFIBRILLATOR PLACEMENT  2011   CAROTID ENDARTERECTOMY Left    CHOLECYSTECTOMY  2010   CORONARY ARTERY BYPASS GRAFT  2010   CABG x 3    DIALYSIS/PERMA CATHETER INSERTION N/A 10/07/2018   Procedure: DIALYSIS/PERMA CATHETER INSERTION;  Surgeon: Katha Cabal, MD;  Location: [place] [facility];  Service: Cardiovascular;  Laterality: N/A;   GRAFT APPLICATION Right 09/29/4130   Procedure: FULL THICKNESS SKIN GRAFT-RIGHT FOOT;  Surgeon: Algernon Huxley, MD;  Location: [facility];  Service: Vascular;  Laterality: Right;   HIP SURGERY Right [place] Right 09/08/2018   Procedure: INCISION AND DRAINAGE - [place] OF DEFECTIVE SKIN, SOFT TISSUE AND BONE;  Surgeon: Albertine Patricia, DPM;  Location: [facility];  Service: Podiatry;  Laterality: Right;   INCISION AND DRAINAGE OF WOUND Right 08/22/2016   Procedure: IRRIGATION AND DEBRIDEMENT WOUND and wound vac placement;  Surgeon: Samara Deist, DPM;  Location: [facility];  Service: Podiatry;  Laterality: Right;   LOWER EXTREMITY ANGIOGRAPHY Right 08/24/2016   Procedure: Lower Extremity Angiography;  Surgeon: Algernon Huxley, MD;  Location: [place] [facility];  Service: Cardiovascular;  Laterality: Right;   LOWER EXTREMITY ANGIOGRAPHY Right 07/27/2018   Procedure: RIGHT Lower Extremity Angiography;  Surgeon: Algernon Huxley, MD;  Location: [place] [facility];  Service: Cardiovascular;  Laterality: Right;   LOWER EXTREMITY ANGIOGRAPHY Right 09/09/2018   Procedure: Lower Extremity Angiography;  Surgeon: Katha Cabal, MD;  Location: [place] [facility];  Service: Cardiovascular;  Laterality: Right;   MASS EXCISION Right 07/18/2014   Procedure: EXCISION HETEROTOPIC BONE RIGHT HIP;  Surgeon: Frederik Pear, MD;  Location: [place];  Service: Orthopedics;  Laterality: Right;   Open Heart Surgery  2010   x 3   WOUND DEBRIDEMENT Right 10/09/2016   Procedure: DEBRIDEMENT WOUND;  Surgeon: Samara Deist, DPM;  Location: [facility];   Service: Podiatry;  Laterality: Right;    Social History   Socioeconomic History   Marital status: Legally Separated    Spouse name: Not on file   Number of children: Not on file   Years of education: Not on file   Highest education level: Not on file  Occupational History   Not on file  Social Needs   Financial resource strain: Not very hard   Food insecurity    Worry: Sometimes true    Inability: Never true   Transportation needs    Medical: No    Non-medical: No  Tobacco Use   Smoking status: Never Smoker   Smokeless tobacco: Never Used  Substance and Sexual Activity   Alcohol use: No   Drug use: No   Sexual activity: Not on  file  Lifestyle   Physical activity    Days per week: Not on file    Minutes per session: Not on file   Stress: Not at all  Relationships   Social connections    Talks on phone: Not on file    Gets together: Not on file    Attends religious service: Not on file    Active member of club or organization: Not on file    Attends meetings of clubs or organizations: Not on file    Relationship status: Not on file   Intimate partner violence    Fear of current or ex partner: Not on file    Emotionally abused: Not on file    Physically abused: Not on file    Forced sexual activity: Not on file  Other Topics Concern   Not on file  Social History Narrative   Not on file    Family History  Problem Relation Age of Onset   Hypertension Other    Diabetes Other    Diabetes Father    Hyperlipidemia Father    Hypertension Father    Hyperlipidemia Sister    Hypertension Sister    Diabetes Sister    Diabetes Brother    Hypertension Brother    Hyperlipidemia Brother    Allergies  Allergen Reactions   Other Other (See Comments)    Cardiac Problems. Pt states he tolerates Toradol. Due to kidney and heart problems per pt   Ibuprofen Other (See Comments)    Heart problems   Baclofen Other (See Comments)    Metformin Diarrhea   Nsaids     Due to kidney and heart problems per pt    ? Current Facility-Administered Medications  Medication Dose Route Frequency Provider Last Rate Last Dose   allopurinol (ZYLOPRIM) tablet 100 mg  100 mg Oral Daily Loletha Grayer, MD   100 mg at 11/22/18 5701   amiodarone (PACERONE) tablet 200 mg  200 mg Oral Daily Loletha Grayer, MD   200 mg at 11/22/18 7793   aspirin EC tablet 81 mg  81 mg Oral Daily Loletha Grayer, MD   81 mg at 11/22/18 9030   carvedilol (COREG) tablet 3.125 mg  3.125 mg Oral BID Loletha Grayer, MD   3.125 mg at 11/22/18 0923   cholecalciferol (VITAMIN D) tablet 5,000 Units  5,000 Units Oral Weekly Wieting, [name], MD       clopidogrel (PLAVIX) tablet 75 mg  75 mg Oral Q breakfast Loletha Grayer, MD   75 mg at 11/22/18 3007   dicyclomine (BENTYL) capsule 10 mg  10 mg Oral TID AC & HS Loletha Grayer, MD   10 mg at 11/22/18 1353   epoetin alfa (EPOGEN) injection 4,000 Units  4,000 Units Intravenous Q T,Th,Sa-HD Lateef, Munsoor, MD       gabapentin (NEURONTIN) capsule 100 mg  100 mg Oral TID Loletha Grayer, MD   100 mg at 11/22/18 6226   HYDROcodone-acetaminophen (NORCO/VICODIN) 5-325 MG per tablet 1 tablet  1 tablet Oral Q6H PRN Loletha Grayer, MD   1 tablet at 11/22/18 1443   insulin aspart (novoLOG) injection 0-5 Units  0-5 Units Subcutaneous QHS Wieting, [name], MD       insulin aspart (novoLOG) injection 0-9 Units  0-9 Units Subcutaneous TID WC Wieting, [name], MD       linezolid (ZYVOX) tablet 600 mg  600 mg Oral Q12H Karyssa Amaral, Joellyn Quails, MD       loperamide (IMODIUM) capsule 2 mg  2 mg  Oral Q8H PRN Loletha Grayer, MD       piperacillin-tazobactam (ZOSYN) IVPB 3.375 g  3.375 g Intravenous Q12H Tsosie Billing, MD       predniSONE (DELTASONE) tablet 10 mg  10 mg Oral Q breakfast Loletha Grayer, MD   10 mg at 11/22/18 0825     Abtx:  Anti-infectives (From admission, onward)   Start      Dose/Rate Route Frequency Ordered Stop   11/22/18 1800  piperacillin-tazobactam (ZOSYN) IVPB 3.375 g     3.375 g 12.5 mL/hr over 240 Minutes Intravenous Every 12 hours 11/22/18 1544     11/22/18 1600  linezolid (ZYVOX) tablet 600 mg     600 mg Oral Every 12 hours 11/22/18 1544     11/22/18 1200  vancomycin (VANCOCIN) IVPB 1000 mg/200 mL premix  Status:  Discontinued     1,000 mg 200 mL/hr over 60 Minutes Intravenous Every T-Th-Sa (Hemodialysis) 11/21/18 1955 11/22/18 1545   11/22/18 1200  ceFEPIme (MAXIPIME) 2 g in sodium chloride 0.9 % 100 mL IVPB  Status:  Discontinued     2 g 200 mL/hr over 30 Minutes Intravenous Once per day on Tue Thu Sat 11/21/18 2007 11/22/18 1541   11/21/18 2000  vancomycin (VANCOCIN) 2,000 mg in sodium chloride 0.9 % 500 mL IVPB  Status:  Discontinued     2,000 mg 250 mL/hr over 120 Minutes Intravenous  Once 11/21/18 1927 11/22/18 1528   11/21/18 1900  ceFEPIme (MAXIPIME) 2 g in sodium chloride 0.9 % 100 mL IVPB     2 g 200 mL/hr over 30 Minutes Intravenous  Once 11/21/18 1856 11/21/18 2355      REVIEW OF SYSTEMS:  Const: negative fever, negative chills, negative weight loss Eyes: negative diplopia or visual changes, negative eye pain ENT: negative coryza, negative sore throat Resp: negative cough, hemoptysis, dyspnea Cards: negative for chest pain, palpitations, lower extremity edema GU: negative for frequency, dysuria and hematuria GI: Negative for abdominal pain, diarrhea, bleeding, constipation Skin: negative for rash and pruritus Heme: negative for easy bruising and gum/nose bleeding MS: Generalized weakness. Weakness in his hands Neurolo: Contractures in his hands  psych: negative for feelings of anxiety, depression   allergy/Immunology-as mentioned above ?  Objective:  VITALS:  BP 124/73 (BP Location: Left Arm)    Pulse 70    Temp 98.2 F (36.8 C) (Oral)    Resp 13    Ht 5\' 10"  (1.778 m)    Wt 92.5 kg    SpO2 100%    BMI 29.26 kg/m  PHYSICAL  EXAM:  General: Alert, cooperative, no distress, appears stated age.  Head: Normocephalic, without obvious abnormality, atraumatic. Eyes: ENT not examined due to mask Neck: Supple, symmetrical, no adenopathy, thyroid: non tender no carotid bruit and no JVD. Back: Not examined Lungs: Bilateral air entry Heart: S1-S2 Sternal scar ICD device site okay abdomen: Soft, non-tender,not distended. Bowel sounds normal. No masses Extremities: rt foot-all toes absent Necrotizing ulcer on the plantar aspect of the midfoot with bone exposed.  Foul-smelling discharge The heel ulcer is almost healed     Left foot heel ulcer has healed    Skin: No rashes or lesions. Or bruising Lymph: Cervical, supraclavicular normal. Neurologic: Grossly non-focal Pertinent Labs Lab Results CBC    Component Value Date/Time   WBC 11.7 (H) 11/21/2018 2114   RBC 3.46 (L) 11/21/2018 2114   HGB 9.5 (L) 11/21/2018 2114   HCT 31.6 (L) 11/21/2018 2114   PLT 269 11/21/2018 2114  MCV 91.3 11/21/2018 2114   MCH 27.5 11/21/2018 2114   MCHC 30.1 11/21/2018 2114   RDW 17.2 (H) 11/21/2018 2114   LYMPHSABS 0.9 09/05/2018 1754   MONOABS 0.7 09/05/2018 1754   EOSABS 0.0 09/05/2018 1754   BASOSABS 0.0 09/05/2018 1754    CMP Latest Ref Rng & Units 11/21/2018 10/10/2018 10/08/2018  Glucose 70 - 99 mg/dL 217(H) 131(H) 156(H)  BUN 6 - 20 mg/dL 53(H) 63(H) 81(H)  Creatinine 0.61 - 1.24 mg/dL 9.07(H) 7.54(H) 8.67(H)  Sodium 135 - 145 mmol/L 135 136 138  Potassium 3.5 - 5.1 mmol/L 5.6(H) 5.1 5.7(H)  Chloride 98 - 111 mmol/L 100 106 108  CO2 22 - 32 mmol/L 20(L) 20(L) 19(L)  Calcium 8.9 - 10.3 mg/dL 8.8(L) 8.0(L) 7.9(L)  Total Protein 6.5 - 8.1 g/dL 7.9 - -  Total Bilirubin 0.3 - 1.2 mg/dL 0.6 - -  Alkaline Phos 38 - 126 U/L 108 - -  AST 15 - 41 U/L 18 - -  ALT 0 - 44 U/L 14 - -      Microbiology: Recent Results (from the past 240 hour(s))  SARS Coronavirus 2 (CEPHEID - Performed in [facility] hospital lab), Hosp  Order     Status: None   Collection Time: 11/21/18  6:42 PM   Specimen: Nasopharyngeal Swab  Result Value Ref Range Status   SARS Coronavirus 2 NEGATIVE NEGATIVE Final    Comment: (NOTE) If result is NEGATIVE SARS-CoV-2 target nucleic acids are NOT DETECTED. The SARS-CoV-2 RNA is generally detectable in upper and lower  respiratory specimens during the acute phase of infection. The lowest  concentration of SARS-CoV-2 viral copies this assay can detect is 250  copies / mL. A negative result does not preclude SARS-CoV-2 infection  and should not be used as the sole basis for treatment or other  patient management decisions.  A negative result may occur with  improper specimen collection / handling, submission of specimen other  than nasopharyngeal swab, presence of viral mutation(s) within the  areas targeted by this assay, and inadequate number of viral copies  (<250 copies / mL). A negative result must be combined with clinical  observations, patient history, and epidemiological information. If result is POSITIVE SARS-CoV-2 target nucleic acids are DETECTED. The SARS-CoV-2 RNA is generally detectable in upper and lower  respiratory specimens dur ing the acute phase of infection.  Positive  results are indicative of active infection with SARS-CoV-2.  Clinical  correlation with patient history and other diagnostic information is  necessary to determine patient infection status.  Positive results do  not rule out bacterial infection or co-infection with other viruses. If result is PRESUMPTIVE POSTIVE SARS-CoV-2 nucleic acids MAY BE PRESENT.   A presumptive positive result was obtained on the submitted specimen  and confirmed on repeat testing.  While 2019 novel coronavirus  (SARS-CoV-2) nucleic acids may be present in the submitted sample  additional confirmatory testing may be necessary for epidemiological  and / or clinical management purposes  to differentiate between  SARS-CoV-2  and other Sarbecovirus currently known to infect humans.  If clinically indicated additional testing with an alternate test  methodology [phone]) is advised. The SARS-CoV-2 RNA is generally  detectable in upper and lower respiratory sp ecimens during the acute  phase of infection. The expected result is Negative. Fact Sheet for Patients:  StrictlyIdeas.no Fact Sheet for Healthcare Providers: BankingDealers.co.za This test is not yet approved or cleared by the [place] FDA and has been authorized for  detection and/or diagnosis of SARS-CoV-2 by FDA under an Emergency Use Authorization (EUA).  This EUA will remain in effect (meaning this test can be used) for the duration of the COVID-19 declaration under Section 564(b)(1) of the Act, 21 U.S.C. section 360bbb-3(b)(1), unless the authorization is terminated or revoked sooner. Performed at [facility], [address] [place], [place] [address]   MRSA PCR Screening     Status: Abnormal   Collection Time: 11/22/18  9:38 AM   Specimen: Nasal Mucosa; Nasopharyngeal  Result Value Ref Range Status   MRSA by PCR POSITIVE (A) NEGATIVE Final    Comment:        The GeneXpert MRSA Assay (FDA approved for NASAL specimens only), is one component of a comprehensive MRSA colonization surveillance program. It is not intended to diagnose MRSA infection nor to guide or monitor treatment for MRSA infections. RESULT CALLED TO, READ BACK BY AND VERIFIED WITH: JASMINE MCLENDON AT 1526 ON 11/22/2018. TIK Performed at [facility], [address], [place], [place] [address]     IMA  I have personally reviewed the films ? 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  This is due to weightbearing on uneven pressure.  Necrotic right foot wound exposing the bone with underlying osteomyelitis of the bones. ?With underlying neuropathy, diabetes mellitus and chronic kidney disease I am afraid this wound will not heal even with antibiotics.  He has been on multiple courses of prolonged antibiotics for the right heel wound infection  and the left heel infection this year and for the right toe infections last year resulting in amputation of all the toes.  The antibiotics especially vancomycin in some ways have contributed to  kidney insufficiency.  He is currently on cefepime and vancomycin which he refused to take.  I agree and I would stop the vancomycin and change to p.o. linezolid.  Because of the necrotic nature of the wound as well as foul-smelling discharge we will add Zosyn.  But need to be careful as Zosyn can also interfere with renal function.  He is currently on dialysis.  Will discuss with nephrologist regarding the extent of his injury and the potential for recovery as the antibiotics will be tailored accordingly. Ideally he will need a BKA but as he has poor social support he has to go to a rehab center post surgery. Patient seen with Dr. Elvina Mattes.  Coronary artery disease status post CABG  AICD  Right hip hardware  Diabetes mellitus Patient states he no longer needs insulin.  Patient is on chronic prednisone 10 mg which also contributes to impair wound healing.  _Anemia of chronic disease  Monitor closely CBC as patient will be on linezolid__________________________________________________ Discussed with patient, requesting provider Note:  This document was prepared using Dragon voice recognition software and may include unintentional dictation errors.

## 2018-11-22 NOTE — Progress Notes (Signed)
Established hemodialysis patient known at [facility] 11:40. Patient transport self to treatments, patient is worried that if his foot gets amputated this will become an issue. Please note any change in Covid or mobility status may change dialysis schedule. Please contact me directly with any dialysis placement concerns.  Elvera Bicker Dialysis Coordinator [phone]

## 2018-11-23 LAB — GLUCOSE, CAPILLARY
Glucose-Capillary: 100 mg/dL — ABNORMAL HIGH (ref 70–99)
Glucose-Capillary: 64 mg/dL — ABNORMAL LOW (ref 70–99)
Glucose-Capillary: 137 mg/dL — ABNORMAL HIGH (ref 70–99)
Glucose-Capillary: 159 mg/dL — ABNORMAL HIGH (ref 70–99)
Glucose-Capillary: 179 mg/dL — ABNORMAL HIGH (ref 70–99)

## 2018-11-23 LAB — BASIC METABOLIC PANEL
Anion gap: 11 (ref 5–15)
BUN: 33 mg/dL — ABNORMAL HIGH (ref 6–20)
CO2: 26 mmol/L (ref 22–32)
Calcium: 8.5 mg/dL — ABNORMAL LOW (ref 8.9–10.3)
Chloride: 102 mmol/L (ref 98–111)
Creatinine, Ser: 5.79 mg/dL — ABNORMAL HIGH (ref 0.61–1.24)
GFR calc Af Amer: 12 mL/min — ABNORMAL LOW (ref >60)
GFR calc non Af Amer: 10 mL/min — ABNORMAL LOW (ref >60)
Glucose, Bld: 104 mg/dL — ABNORMAL HIGH (ref 70–99)
Potassium: 4 mmol/L (ref 3.5–5.1)
Sodium: 139 mmol/L (ref 135–145)

## 2018-11-23 LAB — CBC
HCT: 30.8 % — ABNORMAL LOW (ref 39.0–52.0)
Hemoglobin: 9.1 g/dL — ABNORMAL LOW (ref 13.0–17.0)
MCH: 26.8 pg (ref 26.0–34.0)
MCHC: 29.5 g/dL — ABNORMAL LOW (ref 30.0–36.0)
MCV: 90.9 fL (ref 80.0–100.0)
Platelets: 297 10*3/uL (ref 150–400)
RBC: 3.39 MIL/uL — ABNORMAL LOW (ref 4.22–5.81)
RDW: 17.2 % — ABNORMAL HIGH (ref 11.5–15.5)
WBC: 10 10*3/uL (ref 4.0–10.5)
nRBC: 0 % (ref 0.0–0.2)

## 2018-11-23 LAB — HEPATITIS B SURFACE ANTIGEN: Hepatitis B Surface Ag: NEGATIVE

## 2018-11-23 MED ORDER — CHLORHEXIDINE GLUCONATE CLOTH 2 % EX PADS
6.0000 | MEDICATED_PAD | Freq: Every day | CUTANEOUS | Status: AC
  Administered 2018-11-24 – 2018-11-28 (×4): 6 via TOPICAL

## 2018-11-23 MED ORDER — MUPIROCIN 2 % EX OINT
1.0000 "application " | TOPICAL_OINTMENT | Freq: Two times a day (BID) | CUTANEOUS | Status: AC
  Administered 2018-11-24 – 2018-11-28 (×9): 1 via NASAL
  Filled 2018-11-23: qty 22

## 2018-11-23 MED ORDER — AMIODARONE HCL 200 MG PO TABS
100.0000 mg | ORAL_TABLET | Freq: Every day | ORAL | Status: DC
  Administered 2018-11-25 – 2018-12-01 (×7): 100 mg via ORAL
  Filled 2018-11-23 (×7): qty 1

## 2018-11-23 NOTE — TOC Progression Note (Signed)
Transition of Care (TOC) - Progression Note    Patient Details  Name: Paul [name]. MRN: [id number] Date of Birth: [birth year]  Transition of Care [facility]) CM/SW Contact  [name], RN Phone Number: 11/23/2018, 4:06 PM  Clinical Narrative:    Magda Paganini with [facility] called and stated that they do not have the ability to accept the patient and they have to rescind the bed offer   I spoke to [facility] from [facility] and she stated that the patient has not had a 60 day well break, he has no rehab days left.  Notified the doctor as well as the patient, I encouraged the patient to call medicare they will only talk to the patient.  I encouraged the patient to apply for medicaid as well for long term care   Expected Discharge Plan: [place] Barriers to Discharge: Continued Medical Work up  Expected Discharge Plan and Services Expected Discharge Plan: [place]   Discharge Planning Services: CM Consult   Living arrangements for the past 2 months: [place]                 DME Arranged: N/A                     Social Determinants of Health (SDOH) Interventions    Readmission Risk Interventions Readmission Risk Prevention Plan 10/06/2018 09/09/2018 08/02/2018  Transportation Screening Complete Complete Complete  PCP or Specialist Appt within 3-5 Days Complete Complete -  HRI or Home Care Consult Complete Complete Complete  Social Work Consult for [place] Planning/Counseling Complete Not Complete Complete  SW consult not completed comments - NA -  Palliative Care Screening Not Applicable Not Applicable Not Applicable  Medication Review (RN Care Manager) - - Complete  Some recent data might be hidden

## 2018-11-23 NOTE — TOC Progression Note (Signed)
Transition of Care (TOC) - Progression Note    Patient Details  Name: Paul [name]. MRN: [id number] Date of Birth: 08-30-1965  Transition of Care [facility]) CM/SW Contact  [name], RN Phone Number: 11/23/2018, 1:52 PM  Clinical Narrative:     Spoke with the patient and reviewed the 2 choices for bed offers, he decided to go with [facility] due to it is near his mom's house.  I accepted the bed offer in the Hub. I reviewed with him that after 20 days there is a copay and it can be approx 160-170$, he stated that he would have to go home at the 20th day he can't afford the copay but he would have the person he lives with help him more, he agreed to use the Rollator that he has to be non weight bearing and he is going to work with PT to learn how to use crutches The patient stated that he had applied for Medicaid in Jan and was denied due to not being seperated for 1 year.  I called Amy Fredderick Severance from [place] office and requested assistance to help the patient reapply for Medicaid, left a detailed secure VM requesting her to see or phone the patient     Expected Discharge Plan: [place] Barriers to Discharge: Continued Medical Work up  Expected Discharge Plan and Services Expected Discharge Plan: [place]   Discharge Planning Services: CM Consult   Living arrangements for the past 2 months: [place]                 DME Arranged: N/A                     Social Determinants of Health (SDOH) Interventions    Readmission Risk Interventions Readmission Risk Prevention Plan 10/06/2018 09/09/2018 08/02/2018  Transportation Screening Complete Complete Complete  PCP or Specialist Appt within 3-5 Days Complete Complete -  HRI or Home Care Consult Complete Complete Complete  Social Work Consult for [place] Planning/Counseling Complete Not Complete Complete  SW consult not completed comments - NA -  Palliative Care Screening  Not Applicable Not Applicable Not Applicable  Medication Review (RN Care Manager) - - Complete  Some recent data might be hidden

## 2018-11-23 NOTE — Anesthesia Preprocedure Evaluation (Deleted)
Anesthesia Evaluation    Airway        Dental   Pulmonary           Cardiovascular hypertension,      Neuro/Psych    GI/Hepatic   Endo/Other  diabetes  Renal/GU      Musculoskeletal   Abdominal   Peds  Hematology   Anesthesia Other Findings Past Medical History: No date: Anemia No date: Cardiac defibrillator in place     Comment:  a. Biotronik LUmax 540 DRT, (ser # [id number]). No date: Carotid arterial disease ([place])     Comment:  a. s/p prior LICA stenting;  b. 05/7406 Carotid U/S:               40-59% bilat ICA stenosis. Patent LICA stent. No date: CHF (congestive heart failure) (HCC) No date: CKD (chronic kidney disease), stage III ([place]) No date: Coronary artery disease     Comment:  a. 2010 s/p CABG x 3. No date: DDD (degenerative disc disease), lumbosacral     Comment:  L5-S1 No date: Diabetes [facility])     Comment:  Lantus at bedtime No date: Gangrene of toe of right foot ([place]) 10/06/2016: Gout of left hand No date: HFrEF (heart failure with reduced ejection fraction) ([place])     Comment:  a. 07/2016 Echo: EF 25-30%, diff HK, mild MR, mildly dil               LA, mod reduced RV fxn, PASP 52mmHg. No date: Hypertension     Comment:  takes Coreg daily No date: Ischemic cardiomyopathy     Comment:  a. 07/2016 Echo: EF 25-30%, diff HK. 2009: Myocardial infarction ([place]) No date: Peripheral vascular disease ([place]) No date: Sleep apnea     Comment:  sleep study yr ago-unable to afford cpap 09: Stroke [facility])     Comment:  no weakness   Reproductive/Obstetrics                             Anesthesia Physical Anesthesia Plan Anesthesia Quick Evaluation

## 2018-11-23 NOTE — Progress Notes (Signed)
[facility] [place] NAME: Paul [name]    MR#:  [id number]  DATE OF BIRTH:  Oct 31, 1965  SUBJECTIVE:  CHIEF COMPLAINT:  No chief complaint on file.  Sent from podiatry clinic with infection on the foot.    Have no new complaints.  Was complaining about his diabetic diet and requesting to have regular diet so he can have more options. Still have some pain on the foot.  Debridement is planned for tomorrow. REVIEW OF SYSTEMS:  CONSTITUTIONAL: No fever, fatigue or weakness.  EYES: No blurred or double vision.  EARS, NOSE, AND THROAT: No tinnitus or ear pain.  RESPIRATORY: No cough, shortness of breath, wheezing or hemoptysis.  CARDIOVASCULAR: No chest pain, orthopnea, edema.  GASTROINTESTINAL: No nausea, vomiting, diarrhea or abdominal pain.  GENITOURINARY: No dysuria, hematuria.  ENDOCRINE: No polyuria, nocturia,  HEMATOLOGY: No anemia, easy bruising or bleeding SKIN: No rash or lesion. MUSCULOSKELETAL: No joint pain or arthritis.   NEUROLOGIC: No tingling, numbness, weakness.  PSYCHIATRY: No anxiety or depression.   ROS  DRUG ALLERGIES:   Allergies  Allergen Reactions  . Other Other (See Comments)    Cardiac Problems. Pt states he tolerates Toradol. Due to kidney and heart problems per pt  . Ibuprofen Other (See Comments)    Heart problems  . Baclofen Other (See Comments)  . Metformin Diarrhea  . Nsaids     Due to kidney and heart problems per pt    VITALS:  Blood pressure 112/68, pulse 80, temperature 98.7 F (37.1 C), temperature source Oral, resp. rate 18, height 5\' 10"  (1.778 m), weight 92.5 kg, SpO2 100 %.  PHYSICAL EXAMINATION:  GENERAL:  53 y.o.-year-old patient lying in the bed with no acute distress.  EYES: Pupils equal, round, reactive to light and accommodation. No scleral icterus. Extraocular muscles intact.  HEENT: Head atraumatic, normocephalic. Oropharynx and nasopharynx clear.  NECK:  Supple, no jugular venous  distention. No thyroid enlargement, no tenderness.  LUNGS: Normal breath sounds bilaterally, no wheezing, rales,rhonchi or crepitation. No use of accessory muscles of respiration.  CARDIOVASCULAR: S1, S2 normal. No murmurs, rubs, or gallops.  ABDOMEN: Soft, nontender, nondistended. Bowel sounds present. No organomegaly or mass.  EXTREMITIES: No pedal edema, cyanosis, or clubbing.  NEUROLOGIC: Cranial nerves II through XII are intact. Muscle strength 5/5 in all extremities. Sensation intact. Gait not checked.  PSYCHIATRIC: The patient is alert and oriented x 3.  SKIN: Large ulcer right heel with necrotic material.  Left heel ulcer healing well and no signs of infection  Physical Exam LABORATORY PANEL:   CBC Recent Labs  Lab 11/23/18 0338  WBC 10.0  HGB 9.1*  HCT 30.8*  PLT 297   ------------------------------------------------------------------------------------------------------------------  Chemistries  Recent Labs  Lab 11/21/18 2114 11/23/18 0338  NA 135 139  K 5.6* 4.0  CL 100 102  CO2 20* 26  GLUCOSE 217* 104*  BUN 53* 33*  CREATININE 9.07* 5.79*  CALCIUM 8.8* 8.5*  AST 18  --   ALT 14  --   ALKPHOS 108  --   BILITOT 0.6  --    ------------------------------------------------------------------------------------------------------------------  Cardiac Enzymes No results for input(s): TROPONINI in the last 168 hours. ------------------------------------------------------------------------------------------------------------------  RADIOLOGY:  Ct Foot Right Wo Contrast  Result Date: 11/22/2018 CLINICAL DATA:  Chronic right heel ulceration in a diabetic patient. Question osteomyelitis. EXAM: CT OF THE RIGHT FOOT WITHOUT CONTRAST TECHNIQUE: Multidetector CT imaging of the right foot was performed according to the standard protocol.  Multiplanar CT image reconstructions were also generated. COMPARISON:  CT right ankle 09/09/2018. Plain films right foot 11/21/2018.  FINDINGS: Bones/Joint/Cartilage The patient is status post distal transmetatarsal amputation as seen on prior plain films. Since the prior CT scan, the patient has undergone resection of the posterior calcaneus for osteomyelitis. Bones are osteopenic. No bony destructive change or periosteal reaction is identified. Ligaments Suboptimally assessed by CT. Muscles and Tendons No intramuscular fluid collection. Severe fatty atrophy of intrinsic musculature the foot is noted. Soft tissues A large skin wound on the plantar surface of the foot extends almost to the calcaneus. There is some subcutaneous edema about the foot. A small skin wound is seen over the lateral aspect of the distal fifth metatarsal. Small locules of gas are seen in the posterior soft tissues of the foot. No abscess is identified. IMPRESSION: Large skin wound on the plantar surface of the foot extends almost to the calcaneus. No CT evidence of osteomyelitis is present. Second skin wound over the heel is identified. Small skin wound over the distal fifth metatarsal without evidence of osteomyelitis. 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 Electronically Signed   By: Inge Rise M.D.   On: 11/22/2018 07:48   Dg Foot 2 Views Right  Result Date: 11/21/2018 CLINICAL DATA:  Osteomyelitis. EXAM: RIGHT FOOT - 2 VIEW COMPARISON:  Radiograph 10/05/2018 FINDINGS: Transmetatarsal amputation of the third through fifth rays, prior first and second digit resection. Resection margins are smooth. Possible soft tissue defect about the distal stump versus edema. Progressive soft tissue defect overlying the calcaneus with slight increased bony resorption and fragmentation. Pes planus with underlying osteopenia, no additional findings of osteomyelitis. Advanced vascular calcifications. Generalized soft tissue edema. IMPRESSION: Progressive soft tissue defect overlying the calcaneus with slight increased bony resorption  and fragmentation, concerning for osteomyelitis. Electronically Signed   By: Keith Rake M.D.   On: 11/21/2018 21:58    ASSESSMENT AND PLAN:   Active Problems:   Osteomyelitis ([place])  1.  Right heel diabetic infection. On empiric antibiotics.     Dr. Elvina Mattes to do debridement and wound VAC tomorrow.  Patient not interested in [place] at this time(though it was suggested by podiatry and ID both) CT scan does not show osteomyelitis. Patient have MRSA positive on screening, ID to help decide antibiotics choice. Patient refused for vancomycin so ID suggested Zosyn and linezolid. 2.  End-stage renal disease on hemodialysis Tuesday Thursday and Saturday.    nephrology consultation. 3.  Paroxysmal atrial fibrillation.  Hold Plavix since the patient may end up needing her procedure.  Continue aspirin. 4.  Chronic systolic congestive heart failure.  Dialysis to manage fluid 5.  Gout bilateral hands continue prednisone 10 mg and allopurinol low-dose 6.  Type 2 diabetes mellitus on diet control only.  7.  Chronic loose stools on Bentyl    All the records are reviewed and case discussed with Care Management/Social Workerr. Management plans discussed with the patient, family and they are in agreement.  CODE STATUS: Full.  TOTAL TIME TAKING CARE OF THIS PATIENT: 35 minutes.     POSSIBLE D/C IN 1-2 DAYS, DEPENDING ON CLINICAL CONDITION.   Vaughan Basta M.D on 11/23/2018   Between 7am to 6pm - Pager - [phone]  After 6pm go to [email or web address] - password [email or web address] [facility]  Office  [phone]  CC: Primary care physician; Tracie Harrier, MD  Note: This dictation was prepared with Dragon dictation along with smaller phrase technology.  Any transcriptional errors that result from this process are unintentional.

## 2018-11-23 NOTE — Progress Notes (Signed)
[facility]  ROUNDING NOTE   Subjective:  Patient seen at bedside. Due for dialysis again tomorrow. Podiatry following the patient.    Objective:  Vital signs in last 24 hours:  Temp:  [97.7 F (36.5 C)-98.4 F (36.9 C)] 98.2 F (36.8 C) (07/22 0840) Pulse Rate:  [62-82] 82 (07/22 0840) Resp:  [12-18] 18 (07/22 0840) BP: (115-153)/(63-89) 135/83 (07/22 0840) SpO2:  [97 %-100 %] 100 % (07/22 0840) Weight:  [92.5 kg] 92.5 kg (07/21 1233)  Weight change: 2 kg Filed Weights   11/22/18 0124 11/22/18 0915 11/22/18 1233  Weight: 93 kg 95 kg 92.5 kg    Intake/Output: I/O last 3 completed shifts: In: 494 [P.O.:240; IV Piggyback:254] Out: 2500 [Other:2500]   Intake/Output this shift:  Total I/O In: 120 [P.O.:120] Out: -   Physical Exam: General: No acute distress  Head: Normocephalic, atraumatic. Moist oral mucosal membranes  Eyes: Anicteric  Neck: Supple, trachea midline  Lungs:  Clear to auscultation, normal effort  Heart: S1S2 no rubs  Abdomen:  Soft, nontender, bowel sounds present  Extremities: 1+ LE edema, right foot wrapped  Neurologic: Awake, alert, following commands  Skin: No lesions  Access: IJ PermCath.    Basic Metabolic Panel: Recent Labs  Lab 11/21/18 2114 11/22/18 0926 11/23/18 0338  NA 135  --  139  K 5.6*  --  4.0  CL 100  --  102  CO2 20*  --  26  GLUCOSE 217*  --  104*  BUN 53*  --  33*  CREATININE 9.07*  --  5.79*  CALCIUM 8.8*  --  8.5*  PHOS  --  5.5*  --     Liver Function Tests: Recent Labs  Lab 11/21/18 2114  AST 18  ALT 14  ALKPHOS 108  BILITOT 0.6  PROT 7.9  ALBUMIN 3.5   No results for input(s): LIPASE, AMYLASE in the last 168 hours. No results for input(s): AMMONIA in the last 168 hours.  CBC: Recent Labs  Lab 11/21/18 2114 11/23/18 0338  WBC 11.7* 10.0  HGB 9.5* 9.1*  HCT 31.6* 30.8*  MCV 91.3 90.9  PLT 269 297    Cardiac Enzymes: No results for input(s): CKTOTAL, CKMB, CKMBINDEX,  TROPONINI in the last 168 hours.  BNP: Invalid input(s): POCBNP  CBG: Recent Labs  Lab 11/22/18 1347 11/22/18 1705 11/22/18 2134 11/23/18 0842 11/23/18 0914  GLUCAP 95 140* 112* 64* 100*    Microbiology: Results for orders placed or performed during the hospital encounter of 11/21/18  SARS Coronavirus 2 (CEPHEID - Performed in [facility] hospital lab), Hosp Order     Status: None   Collection Time: 11/21/18  6:42 PM   Specimen: Nasopharyngeal Swab  Result Value Ref Range Status   SARS Coronavirus 2 NEGATIVE NEGATIVE Final    Comment: (NOTE) If result is NEGATIVE SARS-CoV-2 target nucleic acids are NOT DETECTED. The SARS-CoV-2 RNA is generally detectable in upper and lower  respiratory specimens during the acute phase of infection. The lowest  concentration of SARS-CoV-2 viral copies this assay can detect is 250  copies / mL. A negative result does not preclude SARS-CoV-2 infection  and should not be used as the sole basis for treatment or other  patient management decisions.  A negative result may occur with  improper specimen collection / handling, submission of specimen other  than nasopharyngeal swab, presence of viral mutation(s) within the  areas targeted by this assay, and inadequate number of viral copies  (<250 copies /  mL). A negative result must be combined with clinical  observations, patient history, and epidemiological information. If result is POSITIVE SARS-CoV-2 target nucleic acids are DETECTED. The SARS-CoV-2 RNA is generally detectable in upper and lower  respiratory specimens dur ing the acute phase of infection.  Positive  results are indicative of active infection with SARS-CoV-2.  Clinical  correlation with patient history and other diagnostic information is  necessary to determine patient infection status.  Positive results do  not rule out bacterial infection or co-infection with other viruses. If result is PRESUMPTIVE POSTIVE SARS-CoV-2 nucleic  acids MAY BE PRESENT.   A presumptive positive result was obtained on the submitted specimen  and confirmed on repeat testing.  While 2019 novel coronavirus  (SARS-CoV-2) nucleic acids may be present in the submitted sample  additional confirmatory testing may be necessary for epidemiological  and / or clinical management purposes  to differentiate between  SARS-CoV-2 and other Sarbecovirus currently known to infect humans.  If clinically indicated additional testing with an alternate test  methodology [phone]) is advised. The SARS-CoV-2 RNA is generally  detectable in upper and lower respiratory sp ecimens during the acute  phase of infection. The expected result is Negative. Fact Sheet for Patients:  StrictlyIdeas.no Fact Sheet for Healthcare Providers: BankingDealers.co.za This test is not yet approved or cleared by the [place] FDA and has been authorized for detection and/or diagnosis of SARS-CoV-2 by FDA under an Emergency Use Authorization (EUA).  This EUA will remain in effect (meaning this test can be used) for the duration of the COVID-19 declaration under Section 564(b)(1) of the Act, 21 U.S.C. section 360bbb-3(b)(1), unless the authorization is terminated or revoked sooner. Performed at [facility], [address] [place], [place] [address]   MRSA PCR Screening     Status: Abnormal   Collection Time: 11/22/18  9:38 AM   Specimen: Nasal Mucosa; Nasopharyngeal  Result Value Ref Range Status   MRSA by PCR POSITIVE (A) NEGATIVE Final    Comment:        The GeneXpert MRSA Assay (FDA approved for NASAL specimens only), is one component of a comprehensive MRSA colonization surveillance program. It is not intended to diagnose MRSA infection nor to guide or monitor treatment for MRSA infections. RESULT CALLED TO, READ BACK BY AND VERIFIED WITH: JASMINE MCLENDON AT 1526 ON 11/22/2018. TIK Performed at  [facility], [address] [place], [place] [address]   Aerobic/Anaerobic Culture (surgical/deep wound)     Status: None (Preliminary result)   Collection Time: 11/22/18  1:50 PM   Specimen: Wound  Result Value Ref Range Status   Specimen Description   Final    WOUND Performed at [facility], [address] [place], [place] [address]    Special Requests   Final    Immunocompromised,RT HEEL Performed at [facility], [address] [place], [place] [address]    Gram Stain   Final    FEW WBC PRESENT, PREDOMINANTLY PMN FEW GRAM POSITIVE COCCI Performed at [facility], [place] [address] [place],  [address]    Culture PENDING  Incomplete   Report Status PENDING  Incomplete  CULTURE, BLOOD (ROUTINE X 2) w Reflex to ID Panel     Status: None (Preliminary result)   Collection Time: 11/22/18  7:52 PM   Specimen: BLOOD  Result Value Ref Range Status   Specimen Description BLOOD LEFT ANTECUBITAL  Final   Special Requests   Final  BOTTLES DRAWN AEROBIC AND ANAEROBIC Blood Culture adequate volume   Culture   Final    NO GROWTH < 12 HOURS Performed at [facility] [address] [place], [place] [address]    Report Status PENDING  Incomplete  CULTURE, BLOOD (ROUTINE X 2) w Reflex to ID Panel     Status: None (Preliminary result)   Collection Time: 11/22/18  7:52 PM   Specimen: BLOOD  Result Value Ref Range Status   Specimen Description BLOOD BLOOD RIGHT ARM  Final   Special Requests   Final    BOTTLES DRAWN AEROBIC ONLY Blood Culture results may not be optimal due to an inadequate volume of blood received in culture bottles   Culture   Final    NO GROWTH < 12 HOURS Performed at [facility], [address] [place], [place] [address]    Report Status PENDING  Incomplete    Coagulation Studies: Recent Labs    11/21/18 12-Aug-2112  LABPROT 13.7  INR 1.1    Urinalysis: No results for input(s): COLORURINE, LABSPEC,  PHURINE, GLUCOSEU, HGBUR, BILIRUBINUR, KETONESUR, PROTEINUR, UROBILINOGEN, NITRITE, LEUKOCYTESUR in the last 72 hours.  Invalid input(s): APPERANCEUR    Imaging: Ct Foot Right Wo Contrast  Result Date: 11/22/2018 CLINICAL DATA:  Chronic right heel ulceration in a diabetic patient. Question osteomyelitis. EXAM: CT OF THE RIGHT FOOT WITHOUT CONTRAST TECHNIQUE: Multidetector CT imaging of the right foot was performed according to the standard protocol. Multiplanar CT image reconstructions were also generated. COMPARISON:  CT right ankle 09/09/2018. Plain films right foot 11/21/2018. FINDINGS: Bones/Joint/Cartilage The patient is status post distal transmetatarsal amputation as seen on prior plain films. Since the prior CT scan, the patient has undergone resection of the posterior calcaneus for osteomyelitis. Bones are osteopenic. No bony destructive change or periosteal reaction is identified. Ligaments Suboptimally assessed by CT. Muscles and Tendons No intramuscular fluid collection. Severe fatty atrophy of intrinsic musculature the foot is noted. Soft tissues A large skin wound on the plantar surface of the foot extends almost to the calcaneus. There is some subcutaneous edema about the foot. A small skin wound is seen over the lateral aspect of the distal fifth metatarsal. Small locules of gas are seen in the posterior soft tissues of the foot. No abscess is identified. IMPRESSION: Large skin wound on the plantar surface of the foot extends almost to the calcaneus. No CT evidence of osteomyelitis is present. Second skin wound over the heel is identified. Small skin wound over the distal fifth metatarsal without evidence of osteomyelitis. 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 Electronically Signed   By: Inge Rise M.D.   On: 11/22/2018 07:48   Dg Foot 2 Views Right  Result Date: 11/21/2018 CLINICAL DATA:  Osteomyelitis. EXAM: RIGHT FOOT - 2 VIEW  COMPARISON:  Radiograph 10/05/2018 FINDINGS: Transmetatarsal amputation of the third through fifth rays, prior first and second digit resection. Resection margins are smooth. Possible soft tissue defect about the distal stump versus edema. Progressive soft tissue defect overlying the calcaneus with slight increased bony resorption and fragmentation. Pes planus with underlying osteopenia, no additional findings of osteomyelitis. Advanced vascular calcifications. Generalized soft tissue edema. IMPRESSION: Progressive soft tissue defect overlying the calcaneus with slight increased bony resorption and fragmentation, concerning for osteomyelitis. Electronically Signed   By: Keith Rake M.D.   On: 11/21/2018 21:58     Medications:   . piperacillin-tazobactam (ZOSYN)  IV 3.375 g (11/23/18 0524)   . allopurinol  100 mg Oral Daily  . amiodarone  200 mg Oral Daily  . aspirin EC  81 mg Oral Daily  . carvedilol  3.125 mg Oral BID  . [START ON 11/24/2018] Chlorhexidine Gluconate Cloth  6 each Topical Q0600  . cholecalciferol  5,000 Units Oral Weekly  . clopidogrel  75 mg Oral Q breakfast  . dicyclomine  10 mg Oral TID AC & HS  . epoetin (EPOGEN/PROCRIT) injection  4,000 Units Intravenous Q T,Th,Sa-HD  . gabapentin  100 mg Oral TID  . insulin aspart  0-5 Units Subcutaneous QHS  . insulin aspart  0-9 Units Subcutaneous TID WC  . linezolid  600 mg Oral Q12H  . [START ON 11/24/2018] mupirocin ointment  1 application Nasal BID  . predniSONE  10 mg Oral Q breakfast   HYDROcodone-acetaminophen, loperamide  Assessment/ Plan:  53 y.o. male  with type 2 diabetes, diabetic neuropathy, hypertension, coronary disease, congestive heart failure with LVEF of 25 to 30% in January 4920, grade 1 diastolic dysfunction, ICD, history of CABG in 2010, carotid stenosis, stroke, obstructive sleep apnea, peripheral vascular disease, gout, osteomyelitis of heel  [facility]  1.  ESRD on HD TTS.  Patient  underwent dialysis yesterday.  No acute indication for dialysis today.  We will plan for hemodialysis again tomorrow.  2.  Hyperkalemia.  Potassium down to 4.0.  Continue periodically monitor.  3.  Secondary hyperparathyroidism.  Repeat serum phosphorus tomorrow.  Most recent phosphorus was 5.5.  4.  Hypertension.  Continue carvedilol 3.125 mg p.o. twice daily.  5.  Anemia of chronic kidney disease.  Hemoglobin down to 9.1.  Maintain the patient on Epogen 4000 IV with dialysis.   LOS: 2 Ronnell Makarewicz 7/22/202011:03 AM

## 2018-11-23 NOTE — Evaluation (Signed)
Physical Therapy Evaluation Patient Details Name: Paul [name]. MRN: [id number] DOB: June 28, 1965 Today's Date: 11/23/2018   History of Present Illness  presented to ER for non-healing wound to R heel; admited for management of R heel osteomyelitis.  Currently scheduled for I & D 11/24/18.  Medical team has discussed/recommended R BKA, but patient currently refusing.  Clinical Impression  Upon evaluation, patient alert and oriented; follows commands, but demonstrates limited compliance with medical recommendations (NWB R LE) throughout session despite continued cuing from therapist.  Consistently bears weight through R LE during sit/stand transfers, refusing to attempt NWB R LE (does at least attempt to bear weight through forefoot as able).  Multiple gait trials performed during session-20' with RW, min assist; 15' with bilat axillary crutches, mod assist; 25' with knee scooter, min assist.  Bilat UE strength/endurance and marked balance deficits limit success and overall safety with RW and crutches.  Does demonstrate optimal safety and overall compliance with NWB R LE when utilizing knee scooter, but will need continued practice with process for getting on/off with emphasis on NWB R LE.  Will continue to assess/progress as able; anticipate patient compliance being significant issue throughout rehab course. Would benefit from skilled PT to address above deficits and promote optimal return to PLOF; recommend transition to STR upon discharge from acute hospitalization.     Follow Up Recommendations SNF    Equipment Recommendations  (knee scooter)    Recommendations for Other Services       Precautions / Restrictions Precautions Precautions: Fall Restrictions Weight Bearing Restrictions: Yes RLE Weight Bearing: Non weight bearing      Mobility  Bed Mobility Overal bed mobility: Modified Independent                Transfers Overall transfer level: Needs assistance   Transfers:  Sit to/from Stand Sit to Stand: Min assist;Mod assist         General transfer comment: with RW, min assist; with bilat crutches, mod assist; with knee scooter, min assist.  Refuses compliance with NWB R LE (though agreeable to attempting WBing through R forefoot only) with movement transitions  Ambulation/Gait Ambulation/Gait assistance: Min assist Gait Distance (Feet): 20 Feet Assistive device: Rolling walker (2 wheeled)       General Gait Details: hop-to gait pattern; constant cuing for NWB R LE.  UEs fatigue quickly, limiting ability to maintain appropriate WBing.  Refuses attempts at NWB with turn negotiation.  Stairs            Wheelchair Mobility    Modified Rankin (Stroke Patients Only)       Balance Overall balance assessment: Needs assistance Sitting-balance support: No upper extremity supported;Feet supported Sitting balance-Leahy Scale: Good     Standing balance support: Bilateral upper extremity supported Standing balance-Leahy Scale: Poor                               Pertinent Vitals/Pain Pain Assessment: No/denies pain    Home Living Family/patient expects to be discharged to:: Private residence Living Arrangements: Non-relatives/Friends Available Help at Discharge: Friend(s);Available PRN/intermittently Type of Home: House Home Access: Stairs to enter Entrance Stairs-Rails: Right Entrance Stairs-Number of Steps: 3 Home Layout: One level Home Equipment: Walker - 2 wheels;Walker - 4 wheels      Prior Function           Comments: Ambulates household distances with 4ww (uses RW in community--keeps walker in car); house too  small for manual w/c so pt reports he doesn't have it anymore.  Pt reports fall 4 months ago.  Generally non-compliant with recommendations for NWB R LE     Hand Dominance        Extremity/Trunk Assessment   Upper Extremity Assessment Upper Extremity Assessment: (grossly 4/4 throughout; h/o CTS  limiting gross grasp and fine motor bilat hands.  UEs fatigue quickly with attempts to mobilize and maintain NWB R LE)    Lower Extremity Assessment Lower Extremity Assessment: (grossly 4-/5 throughout bilat LEs)       Communication   Communication: No difficulties  Cognition Arousal/Alertness: Awake/alert Behavior During Therapy: WFL for tasks assessed/performed Overall Cognitive Status: Within Functional Limits for tasks assessed                                        General Comments      Exercises Other Exercises Other Exercises: 12' with bilat axillary crutches, mod assist-significant difficulty coordinating, balancing with axillary crutches. No noted improvement in ability to maintain NWB R LE Other Exercises: 68' with knee scooter (R LE), min assist-continued difficulty with NWB R LE during transitional movements, but marked improvement in mobilization and adherance to NWB R LE with this device.  Will need continued practice with process for getting on/off with emphasis on NWB R LE   Assessment/Plan    PT Assessment Patient needs continued PT services  PT Problem List Decreased strength;Decreased activity tolerance;Decreased mobility;Decreased safety awareness;Decreased range of motion;Decreased balance;Decreased coordination;Decreased knowledge of use of DME;Decreased knowledge of precautions;Decreased skin integrity       PT Treatment Interventions DME instruction;Therapeutic activities;Balance training;Gait training;Functional mobility training;Therapeutic exercise;Patient/family education    PT Goals (Current goals can be found in the Care Plan section)  Acute Rehab PT Goals Patient Stated Goal: to get a knee scooter like that PT Goal Formulation: With patient Time For Goal Achievement: 12/07/18 Potential to Achieve Goals: Fair    Frequency Min 2X/week   Barriers to discharge Decreased caregiver support      Co-evaluation                AM-PAC PT "6 Clicks" Mobility  Outcome Measure Help needed turning from your back to your side while in a flat bed without using bedrails?: None Help needed moving from lying on your back to sitting on the side of a flat bed without using bedrails?: None Help needed moving to and from a bed to a chair (including a wheelchair)?: A Little Help needed standing up from a chair using your arms (e.g., wheelchair or bedside chair)?: A Little Help needed to walk in hospital room?: A Little Help needed climbing 3-5 steps with a railing? : Total 6 Click Score: 18    End of Session Equipment Utilized During Treatment: Gait belt Activity Tolerance: Patient tolerated treatment well Patient left: in bed;with call bell/phone within reach(refusing bed alarm) Nurse Communication: Mobility status PT Visit Diagnosis: Muscle weakness (generalized) (M62.81);Difficulty in walking, not elsewhere classified (R26.2)    Time: 9741-6384 PT Time Calculation (min) (ACUTE ONLY): 25 min   Charges:   PT Evaluation $PT Eval Moderate Complexity: 1 Mod PT Treatments $Gait Training: 8-22 mins        Shanon Becvar H. Owens Shark, PT, DPT, NCS 11/23/18, 9:31 PM [phone]

## 2018-11-23 NOTE — Progress Notes (Signed)
Hypoglycemic Event  CBG: 64  Treatment: Breakfast Tray  Symptoms: asymptomatic  Follow-up CBG: Time: 0914 CBG Result:100  Paul [name]

## 2018-11-23 NOTE — Progress Notes (Signed)
[facility] Podiatry                                                      Patient Demographics  Paul Paul [name], is a 53 y.o. male   MRN: [id number]   DOB - [birth year]  Admit Date - 11/21/2018    Outpatient Primary MD for the patient is Tracie Harrier, MD  Consult requested in the Hospital by Paul Paul [name], *, On 11/23/2018    Past Medical History:  Diagnosis Date  . Anemia   . Cardiac defibrillator in place    a. Biotronik LUmax 540 DRT, (ser # [id number]).  . Carotid arterial disease ([place])    a. s/p prior LICA stenting;  b. 06/7739 Carotid U/S: 40-59% bilat ICA stenosis. Patent LICA stent.  . CHF (congestive heart failure) ([place])   . CKD (chronic kidney disease), stage III ([place])   . Coronary artery disease    a. 2010 s/p CABG x 3.  . DDD (degenerative disc disease), lumbosacral    L5-S1  . Diabetes ([place])    Lantus at bedtime  . Gangrene of toe of right foot ([place])   . Gout of left hand 10/06/2016  . HFrEF (heart failure with reduced ejection fraction) ([place])    a. 07/2016 Echo: EF 25-30%, diff HK, mild MR, mildly dil LA, mod reduced RV fxn, PASP 19mmHg.  Marland Kitchen Hypertension    takes Coreg daily  . Ischemic cardiomyopathy    a. 07/2016 Echo: EF 25-30%, diff HK.  . Myocardial infarction ([place]) 2009  . Peripheral vascular disease ([place])   . Sleep apnea    sleep study yr ago-unable to afford cpap  . Stroke [facility]) 09   no weakness      Past Surgical History:  Procedure Laterality Date  . AMPUTATION TOE Right 08/22/2016   Procedure: AMPUTATION TOE;  Surgeon: Samara Deist, DPM;  Location: [facility] OR[facility];  Service: Podiatry;  Laterality: Right;  . AMPUTATION TOE Right 10/09/2016   Procedure: AMPUTATION TOE-RIGHT 2ND MPJ;  Surgeon: Samara Deist, DPM;  Location: [facility];  Service: Podiatry;  Laterality: Right;  . APLIGRAFT PLACEMENT Right 10/09/2016   Procedure:  APLIGRAFT PLACEMENT;  Surgeon: Samara Deist, DPM;  Location: [facility];  Service: Podiatry;  Laterality: Right;  . CALCANEAL OSTEOTOMY Bilateral 07/28/2018   Procedure: RIGHT CALCANECTOMY BILATERAL DEBRIDEMENT OF ULCERS ON HEELS;  Surgeon: Albertine Patricia, DPM;  Location: [facility];  Service: Podiatry;  Laterality: Bilateral;  . CARDIAC DEFIBRILLATOR PLACEMENT  2011  . CAROTID ENDARTERECTOMY Left   . CHOLECYSTECTOMY  2010  . CORONARY ARTERY BYPASS GRAFT  2010   CABG x 3   . DIALYSIS/PERMA CATHETER INSERTION N/A 10/07/2018   Procedure: DIALYSIS/PERMA CATHETER INSERTION;  Surgeon: Katha Cabal, MD;  Location: [place] [facility];  Service: Cardiovascular;  Laterality: N/A;  . GRAFT APPLICATION Right 2/87/8676   Procedure: FULL THICKNESS SKIN GRAFT-RIGHT FOOT;  Surgeon: Algernon Huxley, MD;  Location: [facility];  Service: Vascular;  Laterality: Right;  . HIP SURGERY Right 1994  . INCISION AND DRAINAGE Right 09/08/2018   Procedure: INCISION AND DRAINAGE - [place] OF DEFECTIVE SKIN, SOFT TISSUE AND BONE;  Surgeon: Albertine Patricia, DPM;  Location: [facility];  Service: Podiatry;  Laterality: Right;  . INCISION AND DRAINAGE OF WOUND Right 08/22/2016   Procedure: IRRIGATION AND DEBRIDEMENT WOUND and wound  vac placement;  Surgeon: Samara Deist, DPM;  Location: [facility];  Service: Podiatry;  Laterality: Right;  . LOWER EXTREMITY ANGIOGRAPHY Right 08/24/2016   Procedure: Lower Extremity Angiography;  Surgeon: Algernon Huxley, MD;  Location: [place] [facility];  Service: Cardiovascular;  Laterality: Right;  . LOWER EXTREMITY ANGIOGRAPHY Right 07/27/2018   Procedure: RIGHT Lower Extremity Angiography;  Surgeon: Algernon Huxley, MD;  Location: [place] [facility];  Service: Cardiovascular;  Laterality: Right;  . LOWER EXTREMITY ANGIOGRAPHY Right 09/09/2018   Procedure: Lower Extremity Angiography;  Surgeon: Katha Cabal, MD;  Location: [place] [facility];  Service: Cardiovascular;  Laterality: Right;  .  MASS EXCISION Right 07/18/2014   Procedure: EXCISION HETEROTOPIC BONE RIGHT HIP;  Surgeon: Frederik Pear, MD;  Location: [place];  Service: Orthopedics;  Laterality: Right;  . Open Heart Surgery  2010   x 3  . WOUND DEBRIDEMENT Right 10/09/2016   Procedure: DEBRIDEMENT WOUND;  Surgeon: Samara Deist, DPM;  Location: [facility];  Service: Podiatry;  Laterality: Right;     HPI  Paul Paul [name]  is a 53 y.o. male, had a long history of problems with the right foot.  He had transmetatarsal amputation which is healed but he also had a large decubitus ulceration on the heel it became severely infected resulting in necrosis of the Achilles tendon and osteomyelitis to the calcaneus.  These were resected by me a couple of months ago.  That area is healing well but the patient walked a lot on his foot and developed an ulceration on the plantar surface which is now very large as well.    Review of Systems he is alert and well-oriented  In addition to the HPI above,  No Fever-chills, No Headache, No changes with Vision or hearing, No problems swallowing food or Liquids, No Chest pain, Cough or Shortness of Breath, No Abdominal pain, No Nausea or Vommitting, Bowel movements are regular, No Blood in stool or Urine, No dysuria, No new skin rashes or bruises, No new joints pains-aches,  No new weakness, tingling, numbness in any extremity, No recent weight gain or loss, No polyuria, polydypsia or polyphagia, No significant Mental Stressors.  A full 10 point Review of Systems was done, except as stated above, all other Review of Systems were negative.   Social History Social History   Tobacco Use  . Smoking status: Never Smoker  . Smokeless tobacco: Never Used  Substance Use Topics  . Alcohol use: No    Family History Family History  Problem Relation Age of Onset  . Hypertension Other   . Diabetes Other   . Diabetes Father   . Hyperlipidemia Father   . Hypertension Father   . Hyperlipidemia  Sister   . Hypertension Sister   . Diabetes Sister   . Diabetes Brother   . Hypertension Brother   . Hyperlipidemia Brother     Prior to Admission medications   Medication Sig Start Date End Date Taking? Authorizing Provider  allopurinol (ZYLOPRIM) 100 MG tablet Take 1 tablet (100 mg total) by mouth daily. 08/02/18  Yes Fritzi Mandes, MD  amiodarone (PACERONE) 100 MG tablet Take 100 mg by mouth daily.  01/15/18  Yes [provider]  aspirin EC 81 MG tablet Take 1 tablet (81 mg total) by mouth daily. 10/21/16  Yes Theora Gianotti, NP  carvedilol (COREG) 3.125 MG tablet Take 1 tablet (3.125 mg total) by mouth 2 (two) times daily. 05/29/18  Yes Loletha Grayer, MD  clopidogrel (PLAVIX) 75 MG tablet Take 1 tablet (75 mg total) by mouth daily with breakfast. 09/12/18  Yes Mayo, Pete Pelt, MD  dicyclomine (BENTYL) 10 MG capsule Take 1 capsule (10 mg total) by mouth 4 (four) times daily -  before meals and at bedtime. 10/11/18  Yes Wieting, [name], MD  gabapentin (NEURONTIN) 100 MG capsule Take 1 capsule (100 mg total) by mouth 3 (three) times daily. 10/11/18  Yes Wieting, [name], MD  loperamide (IMODIUM) 2 MG capsule Take 1 capsule (2 mg total) by mouth every 8 (eight) hours as needed for diarrhea or loose stools. 10/11/18  Yes Wieting, [name], MD  predniSONE (DELTASONE) 5 MG tablet Take 1 tablet (5 mg total) by mouth daily with breakfast. 08/02/18  Yes Fritzi Mandes, MD  torsemide (DEMADEX) 20 MG tablet Take 40 mg (two tablets) on days that you do not have dialysis Patient not taking: Reported on 10/31/2018 10/11/18   Loletha Grayer, MD    Anti-infectives (From admission, onward)   Start     Dose/Rate Route Frequency Ordered Stop   11/22/18 1800  piperacillin-tazobactam (ZOSYN) IVPB 3.375 g     3.375 g 12.5 mL/hr over 240 Minutes Intravenous Every 12 hours 11/22/18 1544     11/22/18 1600  linezolid (ZYVOX) tablet 600 mg     600 mg Oral Every 12 hours 11/22/18 1544     11/22/18 1200   vancomycin (VANCOCIN) IVPB 1000 mg/200 mL premix  Status:  Discontinued     1,000 mg 200 mL/hr over 60 Minutes Intravenous Every T-Th-Sa (Hemodialysis) 11/21/18 1955 11/22/18 1545   11/22/18 1200  ceFEPIme (MAXIPIME) 2 g in sodium chloride 0.9 % 100 mL IVPB  Status:  Discontinued     2 g 200 mL/hr over 30 Minutes Intravenous Once per day on Tue Thu Sat 11/21/18 2007 11/22/18 1541   11/21/18 2000  vancomycin (VANCOCIN) 2,000 mg in sodium chloride 0.9 % 500 mL IVPB  Status:  Discontinued     2,000 mg 250 mL/hr over 120 Minutes Intravenous  Once 11/21/18 1927 11/22/18 1528   11/21/18 1900  ceFEPIme (MAXIPIME) 2 g in sodium chloride 0.9 % 100 mL IVPB     2 g 200 mL/hr over 30 Minutes Intravenous  Once 11/21/18 1856 11/21/18 2355      Scheduled Meds: . allopurinol  100 mg Oral Daily  . amiodarone  200 mg Oral Daily  . aspirin EC  81 mg Oral Daily  . carvedilol  3.125 mg Oral BID  . [START ON 11/24/2018] Chlorhexidine Gluconate Cloth  6 each Topical Q0600  . cholecalciferol  5,000 Units Oral Weekly  . clopidogrel  75 mg Oral Q breakfast  . dicyclomine  10 mg Oral TID AC & HS  . epoetin (EPOGEN/PROCRIT) injection  4,000 Units Intravenous Q T,Th,Sa-HD  . gabapentin  100 mg Oral TID  . insulin aspart  0-5 Units Subcutaneous QHS  . insulin aspart  0-9 Units Subcutaneous TID WC  . linezolid  600 mg Oral Q12H  . [START ON 11/24/2018] mupirocin ointment  1 application Nasal BID  . predniSONE  10 mg Oral Q breakfast   Continuous Infusions: . piperacillin-tazobactam (ZOSYN)  IV 3.375 g (11/23/18 0524)   PRN Meds:.HYDROcodone-acetaminophen, loperamide  Allergies  Allergen Reactions  . Other Other (See Comments)    Cardiac Problems. Pt states he tolerates Toradol. Due to kidney and heart problems per pt  . Ibuprofen Other (See Comments)    Heart problems  . Baclofen Other (See Comments)  .  Metformin Diarrhea  . Nsaids     Due to kidney and heart problems per pt    Physical  Exam  Vitals  Blood pressure 135/83, pulse 82, temperature 98.2 F (36.8 C), temperature source Oral, resp. rate 18, height 5\' 10"  (1.778 m), weight 92.5 kg, SpO2 100 %.  Lower Extremity exam: Large ulcer with superficial necrosis to the tissues on the right plantar surface.  The wound is approximately 6.5 cm x 5.5 cm with depth of approximately 3 to 4 cm down to bone.  CT scan x-rays did not show significant demineralization of bone but there is a possibility of some ostia starting in that area also.  Data Review  CBC Recent Labs  Lab 11/21/18 2114 11/23/18 0338  WBC 11.7* 10.0  HGB 9.5* 9.1*  HCT 31.6* 30.8*  PLT 269 297  MCV 91.3 90.9  MCH 27.5 26.8  MCHC 30.1 29.5*  RDW 17.2* 17.2*   ------------------------------------------------------------------------------------------------------------------  Chemistries  Recent Labs  Lab 11/21/18 2114 11/23/18 0338  NA 135 139  K 5.6* 4.0  CL 100 102  CO2 20* 26  GLUCOSE 217* 104*  BUN 53* 33*  CREATININE 9.07* 5.79*  CALCIUM 8.8* 8.5*  AST 18  --   ALT 14  --   ALKPHOS 108  --   BILITOT 0.6  --    ------------------------------------------------------------------------------------------------------------------ estimated creatinine clearance is 16.9 mL/min (A) (by C-G formula based on SCr of 5.79 mg/dL (H)). ------------------------------------------------------------------------------------------------------------------ No results for input(s): TSH, T4TOTAL, T3FREE, THYROIDAB in the last 72 hours.  Invalid input(s): FREET3 Urinalysis    Component Value Date/Time   COLORURINE YELLOW (A) 10/05/2018 1326   APPEARANCEUR HAZY (A) 10/05/2018 1326   LABSPEC 1.009 10/05/2018 1326   PHURINE 5.0 10/05/2018 1326   GLUCOSEU NEGATIVE 10/05/2018 1326   HGBUR SMALL (A) 10/05/2018 1326   BILIRUBINUR NEGATIVE 10/05/2018 1326   KETONESUR NEGATIVE 10/05/2018 1326   PROTEINUR 30 (A) 10/05/2018 1326   UROBILINOGEN 1.0 07/16/2014  1558   NITRITE NEGATIVE 10/05/2018 1326   LEUKOCYTESUR NEGATIVE 10/05/2018 1326     Imaging results:   Ct Foot Right Wo Contrast  Result Date: 11/22/2018 CLINICAL DATA:  Chronic right heel ulceration in a diabetic patient. Question osteomyelitis. EXAM: CT OF THE RIGHT FOOT WITHOUT CONTRAST TECHNIQUE: Multidetector CT imaging of the right foot was performed according to the standard protocol. Multiplanar CT image reconstructions were also generated. COMPARISON:  CT right ankle 09/09/2018. Plain films right foot 11/21/2018. FINDINGS: Bones/Joint/Cartilage The patient is status post distal transmetatarsal amputation as seen on prior plain films. Since the prior CT scan, the patient has undergone resection of the posterior calcaneus for osteomyelitis. Bones are osteopenic. No bony destructive change or periosteal reaction is identified. Ligaments Suboptimally assessed by CT. Muscles and Tendons No intramuscular fluid collection. Severe fatty atrophy of intrinsic musculature the foot is noted. Soft tissues A large skin wound on the plantar surface of the foot extends almost to the calcaneus. There is some subcutaneous edema about the foot. A small skin wound is seen over the lateral aspect of the distal fifth metatarsal. Small locules of gas are seen in the posterior soft tissues of the foot. No abscess is identified. IMPRESSION: Large skin wound on the plantar surface of the foot extends almost to the calcaneus. No CT evidence of osteomyelitis is present. Second skin wound over the heel is identified. Small skin wound over the distal fifth metatarsal without evidence of osteomyelitis. Status post distal transmetatarsal amputation resection of the posterior  calcaneus. Electronically Signed   By: Inge Rise M.D.   On: 11/22/2018 07:48   Dg Foot 2 Views Right  Result Date: 11/21/2018 CLINICAL DATA:  Osteomyelitis. EXAM: RIGHT FOOT - 2 VIEW COMPARISON:  Radiograph 10/05/2018 FINDINGS: Transmetatarsal  amputation of the third through fifth rays, prior first and second digit resection. Resection margins are smooth. Possible soft tissue defect about the distal stump versus edema. Progressive soft tissue defect overlying the calcaneus with slight increased bony resorption and fragmentation. Pes planus with underlying osteopenia, no additional findings of osteomyelitis. Advanced vascular calcifications. Generalized soft tissue edema. IMPRESSION: Progressive soft tissue defect overlying the calcaneus with slight increased bony resorption and fragmentation, concerning for osteomyelitis. Electronically Signed   By: Keith Rake M.D.   On: 11/21/2018 21:58    Assessment & Plan: In spite of myself and Dr. Tama High explaining the likelihood that this will struggle to heal and progress and the fact that we recommended a below-knee amputation he refuses to do that and wants debridement to the area and try to salvage what is left of that foot.  There is purulent drainage from the area needs debridement in order to get that infection under control.  He has a lot of issues with getting admitted preferably to a rehab facility after surgery which were try to work on.  He also needs to apply for Medicaid so that he will have some help and be able to go to rehab facility for a longer period if necessary especially if this ends up resulting in a below-knee amputation which is highly likely.  I went over the consent form with him today and plan to do incision and drainage and excision of infected tissue tomorrow with versa jet and clean the area nicely and if it looks pretty good may put him on a wound VAC at that time.  He has the materials at home to stay nonweightbearing including crutches and knee rest scooter but he does not use them.  I consulted physical therapy today to teach him how to use crutches so he can stay nonweightbearing in the hospital as well.  He is scheduled for surgery tomorrow  Active Problems:    Osteomyelitis [facility])   Family Communication: Plan discussed with patient  Albertine Patricia M.D on 11/23/2018 at 12:42 PM  Thank you for the consult, we will follow the patient with you in the Hospital.

## 2018-11-24 ENCOUNTER — Inpatient Hospital Stay: Payer: Medicare Other | Admitting: Anesthesiology

## 2018-11-24 ENCOUNTER — Encounter
Admission: AD | Disposition: A | Discharge status: Home Health | Payer: Self-pay | Source: Ambulatory Visit | Attending: Internal Medicine

## 2018-11-24 ENCOUNTER — Encounter: Payer: Self-pay | Admitting: Anesthesiology

## 2018-11-24 HISTORY — PX: IRRIGATION AND DEBRIDEMENT ABSCESS: SHX5252

## 2018-11-24 LAB — GLUCOSE, CAPILLARY
Glucose-Capillary: 109 mg/dL — ABNORMAL HIGH (ref 70–99)
Glucose-Capillary: 110 mg/dL — ABNORMAL HIGH (ref 70–99)
Glucose-Capillary: 114 mg/dL — ABNORMAL HIGH (ref 70–99)
Glucose-Capillary: 147 mg/dL — ABNORMAL HIGH (ref 70–99)
Glucose-Capillary: 189 mg/dL — ABNORMAL HIGH (ref 70–99)

## 2018-11-24 LAB — POTASSIUM: Potassium: 4.3 mmol/L (ref 3.5–5.1)

## 2018-11-24 SURGERY — IRRIGATION AND DEBRIDEMENT ABSCESS
Anesthesia: General | Site: Foot | Laterality: Right

## 2018-11-24 MED ORDER — MIDAZOLAM HCL 2 MG/2ML IJ SOLN
INTRAMUSCULAR | Status: AC
  Filled 2018-11-24: qty 2

## 2018-11-24 MED ORDER — PROPOFOL 10 MG/ML IV BOLUS
INTRAVENOUS | Status: AC
  Filled 2018-11-24: qty 20

## 2018-11-24 MED ORDER — OXYCODONE HCL 5 MG PO TABS
5.0000 mg | ORAL_TABLET | Freq: Once | ORAL | Status: AC | PRN
  Administered 2018-11-24: 5 mg via ORAL

## 2018-11-24 MED ORDER — FENTANYL CITRATE (PF) 100 MCG/2ML IJ SOLN
INTRAMUSCULAR | Status: AC
  Administered 2018-11-24: 25 ug via INTRAVENOUS
  Filled 2018-11-24: qty 2

## 2018-11-24 MED ORDER — VANCOMYCIN HCL 1000 MG IV SOLR
INTRAVENOUS | Status: AC
  Filled 2018-11-24: qty 1000

## 2018-11-24 MED ORDER — FENTANYL CITRATE (PF) 100 MCG/2ML IJ SOLN
INTRAMUSCULAR | Status: AC
  Filled 2018-11-24: qty 2

## 2018-11-24 MED ORDER — ONDANSETRON HCL 4 MG/2ML IJ SOLN
INTRAMUSCULAR | Status: AC
  Filled 2018-11-24: qty 2

## 2018-11-24 MED ORDER — EPHEDRINE SULFATE 50 MG/ML IJ SOLN
INTRAMUSCULAR | Status: DC | PRN
  Administered 2018-11-24 (×2): 5 mg via INTRAVENOUS
  Administered 2018-11-24: 10 mg via INTRAVENOUS
  Administered 2018-11-24: 5 mg via INTRAVENOUS

## 2018-11-24 MED ORDER — ONDANSETRON HCL 4 MG/2ML IJ SOLN: 4.0000 mg | Freq: Once | INTRAMUSCULAR | Status: DC | PRN

## 2018-11-24 MED ORDER — SODIUM CHLORIDE 0.9 % IV SOLN
INTRAVENOUS | Status: DC | PRN
  Administered 2018-11-24: 12:00:00 via INTRAVENOUS

## 2018-11-24 MED ORDER — PROPOFOL 10 MG/ML IV BOLUS
INTRAVENOUS | Status: DC | PRN
  Administered 2018-11-24: 150 mg via INTRAVENOUS

## 2018-11-24 MED ORDER — OXYCODONE HCL 5 MG/5ML PO SOLN: 5.0000 mg | Freq: Once | ORAL | Status: AC | PRN

## 2018-11-24 MED ORDER — VANCOMYCIN HCL 1000 MG IV SOLR
INTRAVENOUS | Status: DC | PRN
  Administered 2018-11-24: 1000 mg

## 2018-11-24 MED ORDER — LIDOCAINE HCL (PF) 2 % IJ SOLN
INTRAMUSCULAR | Status: AC
  Filled 2018-11-24: qty 10

## 2018-11-24 MED ORDER — PHENYLEPHRINE HCL (PRESSORS) 10 MG/ML IV SOLN
INTRAVENOUS | Status: DC | PRN
  Administered 2018-11-24 (×2): 100 ug via INTRAVENOUS
  Administered 2018-11-24: 200 ug via INTRAVENOUS
  Administered 2018-11-24: 100 ug via INTRAVENOUS
  Administered 2018-11-24 (×2): 200 ug via INTRAVENOUS
  Administered 2018-11-24: 100 ug via INTRAVENOUS

## 2018-11-24 MED ORDER — GENTAMICIN SULFATE 40 MG/ML IJ SOLN
INTRAMUSCULAR | Status: AC
  Filled 2018-11-24: qty 6

## 2018-11-24 MED ORDER — CHLORHEXIDINE GLUCONATE CLOTH 2 % EX PADS
6.0000 | MEDICATED_PAD | Freq: Every day | CUTANEOUS | Status: DC
  Administered 2018-11-24 – 2018-11-29 (×6): 6 via TOPICAL

## 2018-11-24 MED ORDER — FENTANYL CITRATE (PF) 100 MCG/2ML IJ SOLN
25.0000 ug | INTRAMUSCULAR | Status: DC | PRN
  Administered 2018-11-24 (×2): 25 ug via INTRAVENOUS

## 2018-11-24 MED ORDER — FENTANYL CITRATE (PF) 100 MCG/2ML IJ SOLN
INTRAMUSCULAR | Status: DC | PRN
  Administered 2018-11-24 (×2): 25 ug via INTRAVENOUS

## 2018-11-24 MED ORDER — BUPIVACAINE HCL (PF) 0.5 % IJ SOLN
INTRAMUSCULAR | Status: DC | PRN
  Administered 2018-11-24: 10 mL

## 2018-11-24 MED ORDER — LIDOCAINE HCL (CARDIAC) PF 100 MG/5ML IV SOSY
PREFILLED_SYRINGE | INTRAVENOUS | Status: DC | PRN
  Administered 2018-11-24: 60 mg via INTRAVENOUS

## 2018-11-24 MED ORDER — GENTAMICIN SULFATE 40 MG/ML IJ SOLN
INTRAMUSCULAR | Status: DC | PRN
  Administered 2018-11-24: 240 mg

## 2018-11-24 MED ORDER — MIDAZOLAM HCL 2 MG/2ML IJ SOLN
INTRAMUSCULAR | Status: DC | PRN
  Administered 2018-11-24: 2 mg via INTRAVENOUS

## 2018-11-24 MED ORDER — ONDANSETRON HCL 4 MG/2ML IJ SOLN
INTRAMUSCULAR | Status: DC | PRN
  Administered 2018-11-24: 4 mg via INTRAVENOUS

## 2018-11-24 MED ORDER — OXYCODONE HCL 5 MG PO TABS
ORAL_TABLET | ORAL | Status: AC
  Administered 2018-11-24: 5 mg via ORAL
  Filled 2018-11-24: qty 1

## 2018-11-24 SURGICAL SUPPLY — 47 items
BAG COUNTER SPONGE EZ (MISCELLANEOUS) ×2 IMPLANT
BNDG CONFORM 3 STRL LF (GAUZE/BANDAGES/DRESSINGS) ×3 IMPLANT
BNDG ELASTIC 3X5.8 VLCR NS LF (GAUZE/BANDAGES/DRESSINGS) ×3 IMPLANT
BNDG ELASTIC 4X5.8 VLCR NS LF (GAUZE/BANDAGES/DRESSINGS) ×3 IMPLANT
BNDG ESMARK 4X12 TAN STRL LF (GAUZE/BANDAGES/DRESSINGS) ×3 IMPLANT
BNDG GAUZE 4.5X4.1 6PLY STRL (MISCELLANEOUS) ×3 IMPLANT
CANISTER SUCT 1200ML W/VALVE (MISCELLANEOUS) ×5 IMPLANT
CANISTER WOUND CARE 500ML ATS (WOUND CARE) ×2 IMPLANT
COUNTER SPONGE BAG EZ (MISCELLANEOUS) ×1
COVER WAND RF STERILE (DRAPES) ×3 IMPLANT
CUFF TOURN SGL QUICK 12 (TOURNIQUET CUFF) IMPLANT
CUFF TOURN SGL QUICK 18X4 (TOURNIQUET CUFF) ×2 IMPLANT
DRAPE FLUOR MINI C-ARM 54X84 (DRAPES) ×3 IMPLANT
DRSG MEPITEL 4X7.2 (GAUZE/BANDAGES/DRESSINGS) ×2 IMPLANT
DURAPREP 26ML APPLICATOR (WOUND CARE) ×3 IMPLANT
ELECT REM PT RETURN 9FT ADLT (ELECTROSURGICAL) ×3
ELECTRODE REM PT RTRN 9FT ADLT (ELECTROSURGICAL) ×1 IMPLANT
GAUZE SPONGE 4X4 12PLY STRL (GAUZE/BANDAGES/DRESSINGS) ×3 IMPLANT
GAUZE XEROFORM 1X8 LF (GAUZE/BANDAGES/DRESSINGS) ×3 IMPLANT
GLOVE BIO SURGEON STRL SZ8 (GLOVE) ×3 IMPLANT
GLOVE INDICATOR 8.0 STRL GRN (GLOVE) ×3 IMPLANT
GOWN STRL REUS W/ TWL LRG LVL3 (GOWN DISPOSABLE) ×1 IMPLANT
GOWN STRL REUS W/TWL LRG LVL3 (GOWN DISPOSABLE) ×2
HANDPIECE VERSAJET DEBRIDEMENT (MISCELLANEOUS) ×2 IMPLANT
KIT DRSG VAC SLVR GRANUFM (MISCELLANEOUS) ×2 IMPLANT
KIT STIMULAN RAPID CURE 5CC (Orthopedic Implant) ×2 IMPLANT
KIT TURNOVER KIT A (KITS) ×3 IMPLANT
LABEL OR SOLS (LABEL) ×3 IMPLANT
NDL FILTER BLUNT 18X1 1/2 (NEEDLE) ×1 IMPLANT
NDL HYPO 25X1 1.5 SAFETY (NEEDLE) ×2 IMPLANT
NEEDLE FILTER BLUNT 18X 1/2SAF (NEEDLE) ×2
NEEDLE FILTER BLUNT 18X1 1/2 (NEEDLE) ×1 IMPLANT
NEEDLE HYPO 25X1 1.5 SAFETY (NEEDLE) ×6 IMPLANT
NS IRRIG 500ML POUR BTL (IV SOLUTION) ×3 IMPLANT
PACK EXTREMITY ARMC (MISCELLANEOUS) ×3 IMPLANT
RASP SM TEAR CROSS CUT (RASP) ×2 IMPLANT
STOCKINETTE STRL 6IN 960660 (GAUZE/BANDAGES/DRESSINGS) ×3 IMPLANT
SUT ETHILON 3-0 FS-10 30 BLK (SUTURE) ×3
SUT ETHILON 4-0 (SUTURE) ×2
SUT ETHILON 4-0 FS2 18XMFL BLK (SUTURE) ×1
SUT VIC AB 3-0 SH 27 (SUTURE) ×2
SUT VIC AB 3-0 SH 27X BRD (SUTURE) ×1 IMPLANT
SUT VIC AB 4-0 FS2 27 (SUTURE) ×3 IMPLANT
SUTURE EHLN 3-0 FS-10 30 BLK (SUTURE) ×1 IMPLANT
SUTURE ETHLN 4-0 FS2 18XMF BLK (SUTURE) ×1 IMPLANT
SYR 10ML LL (SYRINGE) ×3 IMPLANT
SYR 3ML LL SCALE MARK (SYRINGE) ×6 IMPLANT

## 2018-11-24 NOTE — Progress Notes (Signed)
PT Cancellation Note  Patient Details Name: Paul [name]. MRN: [id number] DOB: 1965/12/04   Cancelled Treatment:    Reason Eval/Treat Not Completed: Patient at procedure or test/unavailable(Patient currently off unit for procedure (R foot I & D); will continue efforts next date as medically appropriate.)   Cortez Flippen H. Owens Shark, PT, DPT, NCS 11/24/18, 12:23 PM [phone]

## 2018-11-24 NOTE — Progress Notes (Addendum)
Pre hemodialysis report received from B. Mikle Bosworth, RN. Received patient in acute room via bed. Awake, alert and verbally responsive. No acute distress noted. CVC without signs and symptoms of infection.  Dressing in place and uncompromised. Accessed CVC per policy and found patent on each side. Hemodialysis treatment initiated at 1700. Plan for 3.5 hours treatment with UF of 3 as tolerated.    11/24/18 1700  Hand-Off documentation  Report received from (Full Name) Arvil Persons, RN  Vital Signs  Temp (!) 97.4 F (36.3 C)  Temp Source Oral  Pulse Rate 85  Pulse Rate Source Monitor  Resp 18  BP (!) 146/90  BP Location Right Arm  BP Method Automatic  Patient Position (if appropriate) Lying  Oxygen Therapy  SpO2 100 %  O2 Device Room Air  Pain Assessment  Pain Scale 0-10  Pain Score 0  Time-Out for Hemodialysis  What Procedure? hemodialysis  Pt Identifiers(min of two) First/Last Name;MRN/Account#  Correct Site? Yes  Correct Side? Yes  Correct Procedure? Yes  Consents Verified? Yes  Rad Studies Available? N/A  Safety Precautions Reviewed? Yes  Engineer, civil (consulting) Number 5  Station Number 3  UF/Alarm Test Passed  Conductivity: Meter 14  Conductivity: Machine  13.9  pH 7.4  Reverse Osmosis main  Normal Saline Lot Number [id number]  Dialyzer Lot Number [id number]  Disposable Set Lot Number [id number]  Machine Temperature 98.6 F (37 C)  Musician and Audible Yes  Blood Lines Intact and Secured Yes  Pre Treatment Patient Checks  Vascular access used during treatment Catheter  HD catheter dressing before treatment WDL  Hepatitis B Surface Antigen Results Pending  Date Hepatitis B Surface Antigen Drawn 11/24/18  Hepatitis B Surface Antibody  (pending)  Date Hepatitis B Surface Antibody Drawn 11/24/18  Hemodialysis Consent Verified Yes  Hemodialysis Standing Orders Initiated Yes  ECG (Telemetry) Monitor On Yes  Prime Ordered Normal Saline  Length of  DialysisTreatment  -hour(s) 3.5 Hour(s)  Dialyzer Elisio 17H NR  Dialysate 2K;2.5 Ca  Dialysate Flow Ordered 800  Blood Flow Rate Ordered 400 mL/min  Ultrafiltration Goal 3000 Liters  During Hemodialysis Assessment  Blood Flow Rate (mL/min) 400 mL/min  Arterial Pressure (mmHg) -130 mmHg  Venous Pressure (mmHg) 150 mmHg  Transmembrane Pressure (mmHg) 50 mmHg  Ultrafiltration Rate (mL/min) 1000 mL/min  Dialysate Flow Rate (mL/min) 800 ml/min  Conductivity: Machine  14  Dialysis Fluid Bolus Normal Saline  Bolus Amount (mL) 250 mL (prime)  Intra-Hemodialysis Comments Tx initiated

## 2018-11-24 NOTE — Progress Notes (Signed)
[facility] [place] NAME: Paul [name]    MR#:  [id number]  DATE OF BIRTH:  January 18, 1966  SUBJECTIVE:   Patient states he is feeling pretty good this morning.  He denies any pain.  He denies any fevers or chills.  He states he does not want to have an amputation.  REVIEW OF SYSTEMS:  CONSTITUTIONAL: No fever, fatigue or weakness.  EYES: No blurred or double vision.  EARS, NOSE, AND THROAT: No tinnitus or ear pain.  RESPIRATORY: No cough, shortness of breath, wheezing or hemoptysis.  CARDIOVASCULAR: No chest pain, orthopnea, edema.  GASTROINTESTINAL: No nausea, vomiting, diarrhea or abdominal pain.  GENITOURINARY: No dysuria, hematuria.  ENDOCRINE: No polyuria, nocturia,  HEMATOLOGY: No anemia, easy bruising or bleeding SKIN: No rash or lesion. MUSCULOSKELETAL: No joint pain or arthritis.   NEUROLOGIC: No tingling, numbness, weakness.  PSYCHIATRY: No anxiety or depression.   DRUG ALLERGIES:   Allergies  Allergen Reactions  . Other Other (See Comments)    Cardiac Problems. Pt states he tolerates Toradol. Due to kidney and heart problems per pt  . Ibuprofen Other (See Comments)    Heart problems  . Baclofen Other (See Comments)  . Metformin Diarrhea  . Nsaids     Due to kidney and heart problems per pt    VITALS:  Blood pressure (!) 169/104, pulse 87, temperature (!) 97.1 F (36.2 C), resp. rate 18, height 5\' 10"  (1.778 m), weight 92.5 kg, SpO2 99 %.  PHYSICAL EXAMINATION:  GENERAL:  53 y.o.-year-old patient sitting up on edge of bed with no acute distress.  EYES: Pupils equal, round, reactive to light and accommodation. No scleral icterus. Extraocular muscles intact.  HEENT: Head atraumatic, normocephalic. Oropharynx and nasopharynx clear.  NECK:  Supple, no jugular venous distention. No thyroid enlargement, no tenderness.  LUNGS: Normal breath sounds bilaterally, no wheezing, rales,rhonchi or crepitation. No use of accessory  muscles of respiration.  CARDIOVASCULAR: S1, S2 normal. No murmurs, rubs, or gallops.  ABDOMEN: Soft, nontender, nondistended. Bowel sounds present. No organomegaly or mass.  EXTREMITIES: No pedal edema, cyanosis, or clubbing.  NEUROLOGIC: Cranial nerves II through XII are intact. Muscle strength 5/5 in all extremities. Sensation intact. Gait not checked.  PSYCHIATRIC: The patient is alert and oriented x 3.  SKIN: +Large ulcer right heel with necrotic material.  +Left heel ulcer healing well and no signs of infection. No drainage from either site.  LABORATORY PANEL:   CBC Recent Labs  Lab 11/23/18 0338  WBC 10.0  HGB 9.1*  HCT 30.8*  PLT 297   ------------------------------------------------------------------------------------------------------------------  Chemistries  Recent Labs  Lab 11/21/18 2114 11/23/18 0338 11/24/18 1011  NA 135 139  --   K 5.6* 4.0 4.3  CL 100 102  --   CO2 20* 26  --   GLUCOSE 217* 104*  --   BUN 53* 33*  --   CREATININE 9.07* 5.79*  --   CALCIUM 8.8* 8.5*  --   AST 18  --   --   ALT 14  --   --   ALKPHOS 108  --   --   BILITOT 0.6  --   --    ------------------------------------------------------------------------------------------------------------------  Cardiac Enzymes No results for input(s): TROPONINI in the last 168 hours. ------------------------------------------------------------------------------------------------------------------  RADIOLOGY:  No results found.  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 -Continue aspirin, Plavix, Coreg  Chronic systolic  congestive heart failure-stable, no signs of volume overload -Management per nephrology  Gout of the bilateral hands- improving -Continue prednisone and allopurinol   Type 2 diabetes mellitus -Continue SSI  Chronic diarrhea -Continue Bentyl and Imodium  Anemia of chronic kidney disease- hemoglobin is at baseline -EPO with dialysis  All the records are reviewed and case discussed with Care Management/Social Workerr. Management plans discussed with the patient, family and they are in agreement.  CODE STATUS: Full.  TOTAL TIME TAKING CARE OF THIS PATIENT: 35 minutes.    POSSIBLE D/C IN 1-2 DAYS, DEPENDING ON CLINICAL CONDITION.   Berna Spare Mayo M.D on 11/24/2018   Between 7am to 6pm - Pager - [phone]  After 6pm go to [email or web address] - password [email or web address] [facility]  Office  [phone]  CC: Primary care physician; Tracie Harrier, MD  Note: This dictation was prepared with Dragon dictation along with smaller phrase technology. Any transcriptional errors that result from this process are unintentional.

## 2018-11-24 NOTE — Anesthesia Post-op Follow-up Note (Signed)
Anesthesia QCDR form completed.        

## 2018-11-24 NOTE — Progress Notes (Signed)
Pre hemodialysis assessment   11/24/18 1700  Neurological  Level of Consciousness Alert  Orientation Level Oriented X4  Respiratory  Respiratory Pattern Regular;Unlabored  Chest Assessment Chest expansion symmetrical  Bilateral Breath Sounds Clear;Diminished  Cardiac  Pulse Regular  Heart Sounds S1, S2  Jugular Venous Distention (JVD) No  ECG Monitor Yes  Cardiac Rhythm NSR  Ectopy Multifocal PVC's  Ectopy Frequency Occasional  Antiarrhythmic device  Antiarrhythmic device ICD  Vascular  R Radial Pulse +2  L Radial Pulse +2  Integumentary  Integumentary (WDL) X  Skin Color Appropriate for ethnicity  Skin Condition Dry;Flaky  Musculoskeletal  Musculoskeletal (WDL) X  Generalized Weakness Yes  Gastrointestinal  Bowel Sounds Assessment Active  GU Assessment  Genitourinary (WDL) X  Genitourinary Symptoms  (on hemodialysis)  Psychosocial  Psychosocial (WDL) WDL

## 2018-11-24 NOTE — Anesthesia Postprocedure Evaluation (Addendum)
Anesthesia Post Note  Patient: Paul [name].  Procedure(s) Performed: IRRIGATION AND DEBRIDEMENT ABSCESS RIGHT FOOT, COMPLICATED, DIABETIC (Right Foot)  Patient location during evaluation: PACU Anesthesia Type: General Level of consciousness: awake and alert and oriented Pain management: pain level controlled Vital Signs Assessment: post-procedure vital signs reviewed and stable Respiratory status: spontaneous breathing, nonlabored ventilation and respiratory function stable Cardiovascular status: blood pressure returned to baseline and stable Postop Assessment: no signs of nausea or vomiting Anesthetic complications: no     Last Vitals:  Vitals:   11/24/18 1347 11/24/18 1448  BP: (!) 169/104 (!) 157/98  Pulse: 87 86  Resp: 18 17  Temp:    SpO2: 99% 100%    Last Pain:  Vitals:   11/24/18 1456  TempSrc:   PainSc: 9                  Selene Peltzer

## 2018-11-24 NOTE — Anesthesia Preprocedure Evaluation (Addendum)
Anesthesia Evaluation  Patient identified by MRN, date of birth, ID band Patient awake    Reviewed: Allergy & Precautions, H&P , NPO status , Patient's Chart, lab work & pertinent test results  History of Anesthesia Complications Negative for: history of anesthetic complications  Airway Mallampati: II  TM Distance: >3 FB Neck ROM: Full    Dental  (+) Upper Dentures, Lower Dentures   Pulmonary neg sleep apnea, neg COPD,    breath sounds clear to auscultation- rhonchi (-) wheezing      Cardiovascular hypertension, Pt. on medications + CAD, + Past MI, + CABG (2010), + Peripheral Vascular Disease and +CHF (ICD)  (-) Cardiac Stents + Cardiac Defibrillator  Rhythm:Regular Rate:Normal - Systolic murmurs and - Diastolic murmurs    Neuro/Psych neg Seizures CVA, No Residual Symptoms negative psych ROS   GI/Hepatic negative GI ROS, Neg liver ROS,   Endo/Other  diabetes, Poorly Controlled  Renal/GU CRFRenal disease     Musculoskeletal  (+) Arthritis ,   Abdominal (+) + obese,   Peds  Hematology  (+) Blood dyscrasia, anemia ,   Anesthesia Other Findings Past Medical History: No date: Anemia No date: Cardiac defibrillator in place     Comment:  a. Biotronik LUmax 540 DRT, (ser # [id number]). No date: Carotid arterial disease ([place])     Comment:  a. s/p prior LICA stenting;  b. 09/3974 Carotid U/S:               40-59% bilat ICA stenosis. Patent LICA stent. No date: CHF (congestive heart failure) (HCC) No date: CKD (chronic kidney disease), stage III ([place]) No date: Coronary artery disease     Comment:  a. 2010 s/p CABG x 3. No date: DDD (degenerative disc disease), lumbosacral     Comment:  L5-S1 No date: Diabetes [facility])     Comment:  Lantus at bedtime No date: Gangrene of toe of right foot ([place]) 10/06/2016: Gout of left hand No date: HFrEF (heart failure with reduced ejection fraction) ([place])     Comment:  a. 07/2016 Echo: EF  25-30%, diff HK, mild MR, mildly dil               LA, mod reduced RV fxn, PASP 66mmHg. No date: Hypertension     Comment:  takes Coreg daily No date: Ischemic cardiomyopathy     Comment:  a. 07/2016 Echo: EF 25-30%, diff HK. 2009: Myocardial infarction ([place]) No date: Peripheral vascular disease ([place]) No date: Sleep apnea     Comment:  sleep study yr ago-unable to afford cpap 09: Stroke [facility])     Comment:  no weakness  Past Surgical History: 08/22/2016: AMPUTATION TOE; Right     Comment:  Procedure: AMPUTATION TOE;  Surgeon: Samara Deist, DPM;              Location: [facility] ORS;  Service: Podiatry;  Laterality:               Right; 10/09/2016: AMPUTATION TOE; Right     Comment:  Procedure: AMPUTATION TOE-RIGHT 2ND MPJ;  Surgeon:               Samara Deist, DPM;  Location: [facility] OR[facility];  Service:               Podiatry;  Laterality: Right; 10/09/2016: APLIGRAFT PLACEMENT; Right     Comment:  Procedure: APLIGRAFT PLACEMENT;  Surgeon: Samara Deist, DPM;  Location: [facility] OR[facility];  Service: Podiatry;                Laterality: Right; 07/28/2018: CALCANEAL OSTEOTOMY; Bilateral     Comment:  Procedure: RIGHT CALCANECTOMY BILATERAL DEBRIDEMENT OF               ULCERS ON HEELS;  Surgeon: Albertine Patricia, DPM;                Location: [facility] OR[facility];  Service: Podiatry;  Laterality:               Bilateral; 2011: CARDIAC DEFIBRILLATOR PLACEMENT No date: CAROTID ENDARTERECTOMY; Left 2010: CHOLECYSTECTOMY 2010: CORONARY ARTERY BYPASS GRAFT     Comment:  CABG x 3  08/17/6061: GRAFT APPLICATION; Right     Comment:  Procedure: FULL THICKNESS SKIN GRAFT-RIGHT FOOT;                Surgeon: Algernon Huxley, MD;  Location: [facility] OR[facility];  Service:              Vascular;  Laterality: Right; 1994: HIP SURGERY; Right 08/22/2016: INCISION AND DRAINAGE OF WOUND; Right     Comment:  Procedure: IRRIGATION AND DEBRIDEMENT WOUND and wound               vac placement;  Surgeon: Samara Deist, DPM;  Location:                [facility];  Service: Podiatry;  Laterality: Right; 08/24/2016: LOWER EXTREMITY ANGIOGRAPHY; Right     Comment:  Procedure: Lower Extremity Angiography;  Surgeon: Algernon Huxley, MD;  Location: [place] [facility];  Service:               Cardiovascular;  Laterality: Right; 07/27/2018: LOWER EXTREMITY ANGIOGRAPHY; Right     Comment:  Procedure: RIGHT Lower Extremity Angiography;  Surgeon:               Algernon Huxley, MD;  Location: [facility];                Service: Cardiovascular;  Laterality: Right; 07/18/2014: MASS EXCISION; Right     Comment:  Procedure: EXCISION HETEROTOPIC BONE RIGHT HIP;                Surgeon: Frederik Pear, MD;  Location: [facility];  Service:               Orthopedics;  Laterality: Right; 2010: Open Heart Surgery     Comment:  x 3 10/09/2016: WOUND DEBRIDEMENT; Right     Comment:  Procedure: DEBRIDEMENT WOUND;  Surgeon: Samara Deist,               DPM;  Location: [facility];  Service: Podiatry;                Laterality: Right;  BMI    Body Mass Index:  31.74 kg/m      Reproductive/Obstetrics negative OB ROS                             Anesthesia Physical  Anesthesia Plan  ASA: IV  Anesthesia Plan: General   Post-op Pain Management:    Induction: Intravenous  PONV Risk Score and Plan: 1 and Dexamethasone, Ondansetron, Midazolam and Treatment may vary due to age or medical condition  Airway Management Planned: LMA  Additional Equipment:   Intra-op Plan:   Post-operative Plan:   Informed Consent: I have reviewed the patients History and Physical, chart, labs and discussed the procedure including the risks, benefits and alternatives for the proposed anesthesia with the patient or authorized representative who has indicated his/her understanding and acceptance.     Dental Advisory Given  Plan Discussed with: Anesthesiologist and CRNA  Anesthesia Plan Comments:        Anesthesia Quick  Evaluation

## 2018-11-24 NOTE — Anesthesia Procedure Notes (Signed)
Procedure Name: LMA Insertion Date/Time: 11/24/2018 11:39 AM Performed by: Jonna Clark, CRNA Pre-anesthesia Checklist: Patient identified, Patient being monitored, Timeout performed, Emergency Drugs available and Suction available Patient Re-evaluated:Patient Re-evaluated prior to induction Oxygen Delivery Method: Circle system utilized Preoxygenation: Pre-oxygenation with 100% oxygen Induction Type: IV induction Ventilation: Mask ventilation without difficulty LMA: LMA inserted LMA Size: 4.0 Tube type: Oral Number of attempts: 1 Placement Confirmation: positive ETCO2 and breath sounds checked- equal and bilateral Tube secured with: Tape Dental Injury: Teeth and Oropharynx as per pre-operative assessment

## 2018-11-24 NOTE — H&P (Signed)
H and P has been reviewed and no changes are noted.  

## 2018-11-24 NOTE — Progress Notes (Signed)
PT Cancellation Note  Patient Details Name: Paul [name]. MRN: [id number] DOB: [birth year]   Cancelled Treatment:    Reason Eval/Treat Not Completed: Medical issues which prohibited therapy(Per chart review, patient status post R foot irrigation and debridement under general anesthesia this date.  Per guidelines, will require new orders to resume PT services.  Please re-consult as medically appropriate.)   Kham Zuckerman H. Owens Shark, PT, DPT, NCS 11/24/18, 10:33 PM [phone]

## 2018-11-24 NOTE — Progress Notes (Signed)
Hemodialysis treatment completed at 2030. Net UF removed 3L. Blood rinsed back, CVC flushed and locked with NS. Red caps replaced. Dressing remains intact and insertion site remains without signs and symptoms of infection.  Post hemodialysis report given to Adonis Huguenin, RN.    11/24/18 2030  Hand-Off documentation  Report given to (Full Name) Adonis Huguenin, RN  Vital Signs  Pulse Rate 83  Pulse Rate Source Monitor  Resp 20  BP (!) 149/87  BP Location Right Arm  BP Method Automatic  Patient Position (if appropriate) Lying  During Hemodialysis Assessment  Dialysis Fluid Bolus Normal Saline  Bolus Amount (mL) 250 mL (rinseback)  Intra-Hemodialysis Comments Tx completed  Post-Hemodialysis Assessment  Rinseback Volume (mL) 250 mL  KECN 77.9 V  Dialyzer Clearance Lightly streaked  Duration of HD Treatment -hour(s)  (3.5)  Hemodialysis Intake (mL) 500 mL  UF Total -Machine (mL) 3500 mL  Net UF (mL) 3000 mL  Tolerated HD Treatment Yes  Education / Care Plan  Dialysis Education Provided Yes  Documented Education in Care Plan Yes  Outpatient Plan of Care Reviewed and on Chart Yes  Hemodialysis Catheter Right Subclavian Double lumen Permanent (Tunneled)  Placement Date: (c) 11/24/18   Orientation: Right  Access Location: Subclavian  Hemodialysis Catheter Type: Double lumen Permanent (Tunneled)  Site Condition No complications  Blue Lumen Status Capped (Central line);Saline locked  Red Lumen Status Capped (Central line);Saline locked  Dressing Type Biopatch;Occlusive  Dressing Status Clean;Dry;Intact

## 2018-11-24 NOTE — Op Note (Signed)
Operative note   Surgeon: Dr. Albertine Patricia, DPM.    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  Incision and drainage of infection and abscess to the right plantar heel with excision of large necrotic ulcer involving skin subcutaneous tissue and fat and also removal of prominent bone and culture of said bone from the plantar aspect of the right calcaneus         EBL: 20 cc    Anesthesia:general general delivered by anesthesia team I delivered 10 cc 0.5% Marcaine plain around the operative site at the time of surgery.    Hemostasis: Ankle tourniquet at 2 and 50 mils mercury pressure for 24 minutes    Specimen: Necrotic skin subcutaneous tissue and fat was sent to pathology.  I did take some bone from the plantar surface of the calcaneus to culture.    Complications: None    Operative indications: Severe necrotic ulceration plantar right foot with bone exposure    Procedure:  Patient was brought into the OR and placed on the operating table in thesupine position. After anesthesia was obtained theright lower extremity was prepped and draped in usual sterile fashion.  Operative Report: This time attention was directed to the plantar surface of the right foot where a large portion of necrotic eschar was noted on the plantar heel was represented ulceration approximately 6.5 cm x 5.7 cm with depth to the calcaneus with exposed bone.  15 scalpel blade was used to remove the necrotic skin and subcutaneous tissue and fatty tissue.  Versa jet set on level 2 was then used to remove all remaining infected necrotic tissue from the wound was included distally medially laterally and proximally.  I did make a incision of the skin towards the posterior heel to allow for better exposure where the drainage was coming from.  He has a wound on the back of the heel which is much better than it  was several weeks ago.  Appears to be stable.  Once all necrotic and infected tissue was removed I used a combination of rongeur and a rasp to remove any prominence from the plantar calcaneus as much as possible.  The bone did feel fairly solid did not see any overt evidence of infection of the bone at this point but cultures were taken.  The area was then copiously irrigated with approximately 250 cc of sterile saline.  Versa jet was used to lightly go over all the tissues once again.  This point vancomycin and gentamicin beads were placed in the wound specifically posteriorly where the most infection seem to be emanating from.  Beads were then placed directly overlying the plantar wound and covered with Mepitel and then a wound VAC with silver granular foam was placed across the area.  This was hooked up and seem to function well.  Sterile dressing was placed across the area consisting of 4 x 4's Kerlix and an Ace wrap.  Patient is to remain nonweightbearing.    Patient tolerated the procedure and anesthesia well.  Was transported from the OR to the PACU with all vital signs stable and vascular status intact.

## 2018-11-24 NOTE — Transfer of Care (Signed)
Immediate Anesthesia Transfer of Care Note  Patient: Paul [name].  Procedure(s) Performed: IRRIGATION AND DEBRIDEMENT ABSCESS RIGHT FOOT, COMPLICATED, DIABETIC (Right Foot)  Patient Location: PACU  Anesthesia Type:General  Level of Consciousness: sedated  Airway & Oxygen Therapy: Patient Spontanous Breathing and Patient connected to face mask oxygen  Post-op Assessment: Report given to RN and Post -op Vital signs reviewed and stable  Post vital signs: Reviewed and stable  Last Vitals:  Vitals Value Taken Time  BP 151/88 11/24/18 1244  Temp    Pulse 86 11/24/18 1249  Resp 13 11/24/18 1249  SpO2 100 % 11/24/18 1249  Vitals shown include unvalidated device data.  Last Pain:  Vitals:   11/24/18 1244  TempSrc:   PainSc: (P) Asleep      Patients Stated Pain Goal: 2 (28/63/81 7711)  Complications: No apparent anesthesia complications

## 2018-11-24 NOTE — Care Management Important Message (Signed)
Important Message  Patient Details  Name: Paul [name]. MRN: [id number] Date of Birth: 08/26/1965   Medicare Important Message Given:  Yes     Juliann Pulse A [place] 11/24/2018, 11:27 AM

## 2018-11-25 ENCOUNTER — Encounter: Payer: Self-pay | Admitting: Podiatry

## 2018-11-25 DIAGNOSIS — E1142 Type 2 diabetes mellitus with diabetic polyneuropathy: Secondary | ICD-10-CM

## 2018-11-25 DIAGNOSIS — Z978 Presence of other specified devices: Secondary | ICD-10-CM

## 2018-11-25 DIAGNOSIS — Z967 Presence of other bone and tendon implants: Secondary | ICD-10-CM

## 2018-11-25 DIAGNOSIS — L97413 Non-pressure chronic ulcer of right heel and midfoot with necrosis of muscle: Secondary | ICD-10-CM

## 2018-11-25 LAB — GLUCOSE, CAPILLARY
Glucose-Capillary: 172 mg/dL — ABNORMAL HIGH (ref 70–99)
Glucose-Capillary: 201 mg/dL — ABNORMAL HIGH (ref 70–99)
Glucose-Capillary: 216 mg/dL — ABNORMAL HIGH (ref 70–99)
Glucose-Capillary: 251 mg/dL — ABNORMAL HIGH (ref 70–99)

## 2018-11-25 LAB — RENAL FUNCTION PANEL
Albumin: 2.8 g/dL — ABNORMAL LOW (ref 3.5–5.0)
Anion gap: 10 (ref 5–15)
BUN: 18 mg/dL (ref 6–20)
CO2: 30 mmol/L (ref 22–32)
Calcium: 8.2 mg/dL — ABNORMAL LOW (ref 8.9–10.3)
Chloride: 99 mmol/L (ref 98–111)
Creatinine, Ser: 4.41 mg/dL — ABNORMAL HIGH (ref 0.61–1.24)
GFR calc Af Amer: 16 mL/min — ABNORMAL LOW (ref >60)
GFR calc non Af Amer: 14 mL/min — ABNORMAL LOW (ref >60)
Glucose, Bld: 184 mg/dL — ABNORMAL HIGH (ref 70–99)
Phosphorus: 4 mg/dL (ref 2.5–4.6)
Potassium: 4 mmol/L (ref 3.5–5.1)
Sodium: 139 mmol/L (ref 135–145)

## 2018-11-25 LAB — CBC
HCT: 30.7 % — ABNORMAL LOW (ref 39.0–52.0)
Hemoglobin: 9.2 g/dL — ABNORMAL LOW (ref 13.0–17.0)
MCH: 27.1 pg (ref 26.0–34.0)
MCHC: 30 g/dL (ref 30.0–36.0)
MCV: 90.6 fL (ref 80.0–100.0)
Platelets: 302 10*3/uL (ref 150–400)
RBC: 3.39 MIL/uL — ABNORMAL LOW (ref 4.22–5.81)
RDW: 17 % — ABNORMAL HIGH (ref 11.5–15.5)
WBC: 10.7 10*3/uL — ABNORMAL HIGH (ref 4.0–10.5)
nRBC: 0 % (ref 0.0–0.2)

## 2018-11-25 MED ORDER — HYDROCODONE-ACETAMINOPHEN 5-325 MG PO TABS
1.0000 | ORAL_TABLET | ORAL | Status: DC | PRN
  Administered 2018-11-25 – 2018-12-01 (×24): 1 via ORAL
  Filled 2018-11-25 (×24): qty 1

## 2018-11-25 NOTE — Progress Notes (Signed)
[facility] [place] NAME: Paul [name]    MR#:  [id number]  DATE OF BIRTH:  1966-03-23  SUBJECTIVE:   Patient states he is feeling well this morning.  His procedure went well yesterday.  He states he did have some "throbbing pain" of his foot yesterday.  He feels like the pain medicines are helping, but they wear off within 3-4 hours.  No other concerns this morning.  REVIEW OF SYSTEMS:  CONSTITUTIONAL: No fever, fatigue or weakness.  EYES: No blurred or double vision.  EARS, NOSE, AND THROAT: No tinnitus or ear pain.  RESPIRATORY: No cough, shortness of breath, wheezing or hemoptysis.  CARDIOVASCULAR: No chest pain, orthopnea, edema.  GASTROINTESTINAL: No nausea, vomiting, diarrhea or abdominal pain.  GENITOURINARY: No dysuria, hematuria.  ENDOCRINE: No polyuria, nocturia,  HEMATOLOGY: No anemia, easy bruising or bleeding SKIN: No rash or lesion. MUSCULOSKELETAL: No joint pain or arthritis.  +right foot pain. NEUROLOGIC: No tingling, numbness, weakness.  PSYCHIATRY: No anxiety or depression.   DRUG ALLERGIES:   Allergies  Allergen Reactions  . Other Other (See Comments)    Cardiac Problems. Pt states he tolerates Toradol. Due to kidney and heart problems per pt  . Ibuprofen Other (See Comments)    Heart problems  . Baclofen Other (See Comments)  . Metformin Diarrhea  . Nsaids     Due to kidney and heart problems per pt    VITALS:  Blood pressure 132/83, pulse 88, temperature 98.2 F (36.8 C), temperature source Oral, resp. rate 17, height 5\' 10"  (1.778 m), weight 92.5 kg, SpO2 100 %.  PHYSICAL EXAMINATION:  GENERAL:  53 y.o.-year-old patient sitting up on edge of bed with no acute distress.  EYES: Pupils equal, round, reactive to light and accommodation. No scleral icterus. Extraocular muscles intact.  HEENT: Head atraumatic, normocephalic. Oropharynx and nasopharynx clear.  NECK:  Supple, no jugular venous distention. No  thyroid enlargement, no tenderness.  LUNGS: Normal breath sounds bilaterally, no wheezing, rales,rhonchi or crepitation. No use of accessory muscles of respiration.  CARDIOVASCULAR: S1, S2 normal. No murmurs, rubs, or gallops.  ABDOMEN: Soft, nontender, nondistended. Bowel sounds present. No organomegaly or mass.  EXTREMITIES: No pedal edema, cyanosis, or clubbing. 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 NEUROLOGIC: Cranial nerves II through XII are intact. Muscle strength 5/5 in all extremities. Sensation intact. Gait not checked.  PSYCHIATRIC: The patient is alert and oriented x 3.  SKIN: +wound vac and dry dressing place over right foot.  LABORATORY PANEL:   CBC Recent Labs  Lab 11/25/18 0400  WBC 10.7*  HGB 9.2*  HCT 30.7*  PLT 302   ------------------------------------------------------------------------------------------------------------------  Chemistries  Recent Labs  Lab 11/21/18 2114  11/25/18 0400  NA 135   < > 139  K 5.6*   < > 4.0  CL 100   < > 99  CO2 20*   < > 30  GLUCOSE 217*   < > 184*  BUN 53*   < > 18  CREATININE 9.07*   < > 4.41*  CALCIUM 8.8*   < > 8.2*  AST 18  --   --   ALT 14  --   --   ALKPHOS 108  --   --   BILITOT 0.6  --   --    < > = values in this interval not displayed.   ------------------------------------------------------------------------------------------------------------------  Cardiac Enzymes No results for input(s): TROPONINI in the last 168 hours. ------------------------------------------------------------------------------------------------------------------  RADIOLOGY:  No results found.  ASSESSMENT AND PLAN:   Active Problems:   Osteomyelitis ([place])  Necrotic right foot wound with underlying osteomyelitis of the calcaneus -s/p I&D and wound vac placement on 7/24 -Podiatry following- plan to change wound vac tomorrow -Patient has refused BKA -ID consulted -Continue linezolid and zosyn -NWB to right foot  ESRD  on HD TTS- stable -Nephrology following  Paroxysmal atrial fibrillation-patient has been in normal sinus rhythm here. -Continue aspirin, Plavix, Coreg  Chronic systolic congestive heart failure- stable, no signs of volume overload -Management per nephrology  Gout of the bilateral hands- improving -Continue prednisone and allopurinol   Type 2 diabetes mellitus -Continue SSI  Chronic diarrhea -Continue Bentyl and Imodium  Anemia of chronic kidney disease- hemoglobin is at baseline -EPO with dialysis  PT recommended discharge to SNF, but patient is out of bed days, so he will need to be discharged home with maximum home health services. Wheelchair has already been ordered.  All the records are reviewed and case discussed with Care Management/Social Workerr. Management plans discussed with the patient, family and they are in agreement.  CODE STATUS: Full.  TOTAL TIME TAKING CARE OF THIS PATIENT: 33 minutes.    POSSIBLE D/C IN 1-2 DAYS, DEPENDING ON CLINICAL CONDITION.   Berna Spare Mayo M.D on 11/25/2018   Between 7am to 6pm - Pager - [phone]  After 6pm go to [email or web address] - password [email or web address] [facility]  Office  [phone]  CC: Primary care physician; Tracie Harrier, MD  Note: This dictation was prepared with Dragon dictation along with smaller phrase technology. Any transcriptional errors that result from this process are unintentional.

## 2018-11-25 NOTE — Progress Notes (Signed)
[facility] Podiatry                                                      Patient Demographics  Paul [name], is a 53 y.o. male   MRN: [id number]   DOB - Sep 03, 1965  Admit Date - 11/21/2018    Outpatient Primary [name] for the patient is Paul Harrier, [name]  Consult requested in the Hospital by Paul [name], On 11/25/2018     Past Medical History:  Diagnosis Date  . Anemia   . Cardiac defibrillator in place    a. Paul [name], (ser # [id number]).  . Carotid arterial disease ([place])    a. s/p prior LICA stenting;  b. 05/4968 Carotid U/S: 40-59% bilat ICA stenosis. Patent LICA stent.  . CHF (congestive heart failure) ([place])   . CKD (chronic kidney disease), stage III ([place])   . Coronary artery disease    a. 2010 s/p CABG x 3.  . DDD (degenerative disc disease), lumbosacral    L5-S1  . Diabetes ([place])    Lantus at bedtime  . Gangrene of toe of right foot ([place])   . Gout of left hand 10/06/2016  . HFrEF (heart failure with reduced ejection fraction) ([place])    a. 07/2016 Echo: EF 25-30%, diff HK, mild MR, mildly dil LA, mod reduced RV fxn, PASP 43mmHg.  Marland Kitchen Hypertension    takes Coreg daily  . Ischemic cardiomyopathy    a. 07/2016 Echo: EF 25-30%, diff HK.  . Myocardial infarction ([place]) 2009  . Peripheral vascular disease ([place])   . Sleep apnea    sleep study yr ago-unable to afford cpap  . Stroke [facility]) 09   no weakness      Past Surgical History:  Procedure Laterality Date  . AMPUTATION TOE Right 08/22/2016   Procedure: AMPUTATION TOE;  Surgeon: Paul [name], DPM;  Location: [facility] OR[facility];  Service: Podiatry;  Laterality: Right;  . AMPUTATION TOE Right 10/09/2016   Procedure: AMPUTATION TOE-RIGHT 2ND MPJ;  Surgeon: Paul [name], DPM;  Location: [facility] OR[facility];  Service: Podiatry;  Laterality: Right;  . APLIGRAFT PLACEMENT Right 10/09/2016   Procedure: APLIGRAFT  PLACEMENT;  Surgeon: Paul [name], DPM;  Location: [facility] OR[facility];  Service: Podiatry;  Laterality: Right;  . CALCANEAL OSTEOTOMY Bilateral 07/28/2018   Procedure: RIGHT CALCANECTOMY BILATERAL DEBRIDEMENT OF ULCERS ON HEELS;  Surgeon: Paul [name], DPM;  Location: [facility] OR[facility];  Service: Podiatry;  Laterality: Bilateral;  . CARDIAC DEFIBRILLATOR PLACEMENT  2011  . CAROTID ENDARTERECTOMY Left   . CHOLECYSTECTOMY  2010  . CORONARY ARTERY BYPASS GRAFT  2010   CABG x 3   . DIALYSIS/PERMA CATHETER INSERTION N/A 10/07/2018   Procedure: DIALYSIS/PERMA CATHETER INSERTION;  Surgeon: Paul Cabal, [name];  Location: [place] [facility];  Service: Cardiovascular;  Laterality: N/A;  . GRAFT APPLICATION Right 2/63/7858   Procedure: FULL THICKNESS SKIN GRAFT-RIGHT FOOT;  Surgeon: Paul Huxley, [name];  Location: [facility];  Service: Vascular;  Laterality: Right;  . HIP SURGERY Right 1994  . INCISION AND DRAINAGE Right 09/08/2018   Procedure: INCISION AND DRAINAGE - [place] OF DEFECTIVE SKIN, SOFT TISSUE AND BONE;  Surgeon: Paul [name], DPM;  Location: [facility];  Service: Podiatry;  Laterality: Right;  . INCISION AND DRAINAGE OF WOUND Right 08/22/2016   Procedure: IRRIGATION AND DEBRIDEMENT WOUND  and wound vac placement;  Surgeon: Paul [name], DPM;  Location: [facility];  Service: Podiatry;  Laterality: Right;  . LOWER EXTREMITY ANGIOGRAPHY Right 08/24/2016   Procedure: Lower Extremity Angiography;  Surgeon: Paul Huxley, [name];  Location: [place] [facility];  Service: Cardiovascular;  Laterality: Right;  . LOWER EXTREMITY ANGIOGRAPHY Right 07/27/2018   Procedure: RIGHT Lower Extremity Angiography;  Surgeon: Paul Huxley, [name];  Location: [place] [facility];  Service: Cardiovascular;  Laterality: Right;  . LOWER EXTREMITY ANGIOGRAPHY Right 09/09/2018   Procedure: Lower Extremity Angiography;  Surgeon: Paul Cabal, [name];  Location: [place] [facility];  Service: Cardiovascular;  Laterality: Right;  . MASS  EXCISION Right 07/18/2014   Procedure: EXCISION HETEROTOPIC BONE RIGHT HIP;  Surgeon: Frederik Pear, [name];  Location: [place];  Service: Orthopedics;  Laterality: Right;  . Open Heart Surgery  2010   x 3  . WOUND DEBRIDEMENT Right 10/09/2016   Procedure: DEBRIDEMENT WOUND;  Surgeon: Paul [name], DPM;  Location: [facility];  Service: Podiatry;  Laterality: Right;    in for   No chief complaint on file.    HPI  Paul [name]  is a 53 y.o. male, 1 day status post excisional debridement of infected ulceration plantar heel along with removal some of bone from the plantar heel.  He has a wound VAC on the plantar heel at this point and is nonweightbearing on it.  He has had a history of pronounced infection to the posterior ulcer on the right heel where he lost his Achilles tendon distally as well as some of the bone due to infection.  Developed a plantar ulceration that he continue to walk and stand on made it worse.  Did surgery yesterday to remove infected skin subcutaneous tissue fat and bone.  Bone actually look pretty clean then see me obvious evidence of bone infection at this point.  Primary cultures are coming back with likely Pseudomonas and staph aureus not sure what the staph is susceptible to at this point.    Social History Social History   Tobacco Use  . Smoking status: Never Smoker  . Smokeless tobacco: Never Used  Substance Use Topics  . Alcohol use: No    Family History Family History  Problem Relation Age of Onset  . Hypertension Other   . Diabetes Other   . Diabetes Father   . Hyperlipidemia Father   . Hypertension Father   . Hyperlipidemia Sister   . Hypertension Sister   . Diabetes Sister   . Diabetes Brother   . Hypertension Brother   . Hyperlipidemia Brother     Prior to Admission medications   Medication Sig Start Date End Date Taking? Authorizing Provider  allopurinol (ZYLOPRIM) 100 MG tablet Take 1 tablet (100 mg total) by mouth daily. 08/02/18  Yes Fritzi Mandes, [name]  amiodarone (PACERONE) 100 MG tablet Take 100 mg by mouth daily.  01/15/18  Yes Provider, Historical, [name]  aspirin EC 81 MG tablet Take 1 tablet (81 mg total) by mouth daily. 10/21/16  Yes Theora Gianotti, NP  carvedilol (COREG) 3.125 MG tablet Take 1 tablet (3.125 mg total) by mouth 2 (two) times daily. 05/29/18  Yes Loletha Grayer, [name]  clopidogrel (PLAVIX) 75 MG tablet Take 1 tablet (75 mg total) by mouth daily with breakfast. 09/12/18  Yes Mayo, Paul Pelt, [name]  dicyclomine (BENTYL) 10 MG capsule Take 1 capsule (10 mg total) by mouth 4 (four) times daily -  before meals and at  bedtime. 10/11/18  Yes Wieting, [name], [name]  gabapentin (NEURONTIN) 100 MG capsule Take 1 capsule (100 mg total) by mouth 3 (three) times daily. 10/11/18  Yes Wieting, [name], [name]  loperamide (IMODIUM) 2 MG capsule Take 1 capsule (2 mg total) by mouth every 8 (eight) hours as needed for diarrhea or loose stools. 10/11/18  Yes Wieting, [name], [name]  predniSONE (DELTASONE) 5 MG tablet Take 1 tablet (5 mg total) by mouth daily with breakfast. 08/02/18  Yes Fritzi Mandes, [name]  torsemide (DEMADEX) 20 MG tablet Take 40 mg (two tablets) on days that you do not have dialysis Patient not taking: Reported on 10/31/2018 10/11/18   Loletha Grayer, [name]    Anti-infectives (From admission, onward)   Start     Dose/Rate Route Frequency Ordered Stop   11/24/18 1216  vancomycin (VANCOCIN) powder  Status:  Discontinued       As needed 11/24/18 1216 11/24/18 1251   11/24/18 1216  gentamicin (GARAMYCIN) injection  Status:  Discontinued       As needed 11/24/18 1217 11/24/18 1251   11/22/18 1800  piperacillin-tazobactam (ZOSYN) IVPB 3.375 g     3.375 g 12.5 mL/hr over 240 Minutes Intravenous Every 12 hours 11/22/18 1544     11/22/18 1600  linezolid (ZYVOX) tablet 600 mg     600 mg Oral Every 12 hours 11/22/18 1544     11/22/18 1200  vancomycin (VANCOCIN) IVPB 1000 mg/200 mL premix  Status:  Discontinued     1,000 mg 200 mL/hr over 60  Minutes Intravenous Every T-Th-Sa (Hemodialysis) 11/21/18 1955 11/22/18 1545   11/22/18 1200  ceFEPIme (MAXIPIME) 2 g in sodium chloride 0.9 % 100 mL IVPB  Status:  Discontinued     2 g 200 mL/hr over 30 Minutes Intravenous Once per day on Tue Thu Sat 11/21/18 2007 11/22/18 1541   11/21/18 2000  vancomycin (VANCOCIN) 2,000 mg in sodium chloride 0.9 % 500 mL IVPB  Status:  Discontinued     2,000 mg 250 mL/hr over 120 Minutes Intravenous  Once 11/21/18 1927 11/22/18 1528   11/21/18 1900  ceFEPIme (MAXIPIME) 2 g in sodium chloride 0.9 % 100 mL IVPB     2 g 200 mL/hr over 30 Minutes Intravenous  Once 11/21/18 1856 11/21/18 2355      Scheduled Meds: . allopurinol  100 mg Oral Daily  . amiodarone  100 mg Oral Daily  . aspirin EC  81 mg Oral Daily  . carvedilol  3.125 mg Oral BID  . Chlorhexidine Gluconate Cloth  6 each Topical Q0600  . Chlorhexidine Gluconate Cloth  6 each Topical Q0600  . clopidogrel  75 mg Oral Q breakfast  . dicyclomine  10 mg Oral TID AC & HS  . epoetin (EPOGEN/PROCRIT) injection  4,000 Units Intravenous Q T,Th,Sa-HD  . gabapentin  100 mg Oral TID  . insulin aspart  0-5 Units Subcutaneous QHS  . insulin aspart  0-9 Units Subcutaneous TID WC  . linezolid  600 mg Oral Q12H  . mupirocin ointment  1 application Nasal BID  . predniSONE  10 mg Oral Q breakfast   Continuous Infusions: . piperacillin-tazobactam (ZOSYN)  IV 3.375 g (11/25/18 0435)   PRN Meds:.HYDROcodone-acetaminophen, loperamide  Allergies  Allergen Reactions  . Other Other (See Comments)    Cardiac Problems. Pt states he tolerates Toradol. Due to kidney and heart problems per pt  . Ibuprofen Other (See Comments)    Heart problems  . Baclofen Other (See Comments)  . Metformin  Diarrhea  . Nsaids     Due to kidney and heart problems per pt    Physical Exam  Vitals  Blood pressure 134/79, pulse 88, temperature 98.2 F (36.8 C), temperature source Oral, resp. rate 18, height 5\' 10"  (1.778 m),  weight 92.5 kg, SpO2 98 %.  Lower Extremity exam: Recent Labs  Lab 11/21/18 2114 11/23/18 0338 11/25/18 0400  WBC 11.7* 10.0 10.7*  HGB 9.5* 9.1* 9.2*  HCT 31.6* 30.8* 30.7*  PLT 269 297 302  MCV 91.3 90.9 90.6  MCH 27.5 26.8 27.1  MCHC 30.1 29.5* 30.0  RDW 17.2* 17.2* 17.0*   ------------------------------------------------------------------------------------------------------------------  Chemistries  Recent Labs  Lab 11/21/18 2114 11/23/18 0338 11/24/18 1011 11/25/18 0400  NA 135 139  --  139  K 5.6* 4.0 4.3 4.0  CL 100 102  --  99  CO2 20* 26  --  30  GLUCOSE 217* 104*  --  184*  BUN 53* 33*  --  18  CREATININE 9.07* 5.79*  --  4.41*  CALCIUM 8.8* 8.5*  --  8.2*  AST 18  --   --   --   ALT 14  --   --   --   ALKPHOS 108  --   --   --   BILITOT 0.6  --   --   --    ------------------------------------------------------------------------------------------------------------------ estimated creatinine clearance is 22.1 mL/min (A) (by C-G formula based on SCr of 4.41 mg/dL (H)). ------------------------------------------------------------------------------------------------------------------ No results for input(s): TSH, T4TOTAL, T3FREE, THYROIDAB in the last 72 hours.  Invalid input(s): FREET3 Urinalysis    Component Value Date/Time   COLORURINE YELLOW (A) 10/05/2018 1326   APPEARANCEUR HAZY (A) 10/05/2018 1326   LABSPEC 1.009 10/05/2018 1326   PHURINE 5.0 10/05/2018 1326   GLUCOSEU NEGATIVE 10/05/2018 1326   HGBUR SMALL (A) 10/05/2018 1326   BILIRUBINUR NEGATIVE 10/05/2018 1326   [place] 10/05/2018 1326   PROTEINUR 30 (A) 10/05/2018 1326   UROBILINOGEN 1.0 07/16/2014 1558   NITRITE NEGATIVE 10/05/2018 1326   LEUKOCYTESUR NEGATIVE 10/05/2018 1326     Assessment & Plan wound VAC is intact and functional I will leave it in place today.  I will put orders in for him to be able to transfer to a bedside toilet and a bedside chair but needs to  remain nonweightbearing on the right foot.  I will check him tomorrow and probably change the wound VAC at that time frame..  Active Problems:   Osteomyelitis [facility])   Family Communication: Plan discussed with patient and **  Paul [name] M.D on 11/25/2018 at 9:49 AM  Thank you for the consult, we will follow the patient with you in the Hospital.

## 2018-11-25 NOTE — Progress Notes (Signed)
ID Pt is doing okay, some pain in the rt foot He I upset that he will not be able to go to rehab as he was short of 3 days  BP 138/86 (BP Location: Left Arm)   Pulse 87   Temp 98.3 F (36.8 C) (Oral)   Resp 18   Ht 5\' 10"  (1.778 m)   Wt 92.5 kg   SpO2 100%   BMI 29.26 kg/m    Awake and alert Rt foot- surgical dressing not removed Wound vac present  CBC Latest Ref Rng & Units 11/25/2018 11/23/2018 11/21/2018  WBC 4.0 - 10.5 K/uL 10.7(H) 10.0 11.7(H)  Hemoglobin 13.0 - 17.0 g/dL 9.2(L) 9.1(L) 9.5(L)  Hematocrit 39.0 - 52.0 % 30.7(L) 30.8(L) 31.6(L)  Platelets 150 - 400 K/uL 302 297 269   CMP Latest Ref Rng & Units 11/25/2018 11/24/2018 11/23/2018  Glucose 70 - 99 mg/dL 184(H) - 104(H)  BUN 6 - 20 mg/dL 18 - 33(H)  Creatinine 0.61 - 1.24 mg/dL 4.41(H) - 5.79(H)  Sodium 135 - 145 mmol/L 139 - 139  Potassium 3.5 - 5.1 mmol/L 4.0 4.3 4.0  Chloride 98 - 111 mmol/L 99 - 102  CO2 22 - 32 mmol/L 30 - 26  Calcium 8.9 - 10.3 mg/dL 8.2(L) - 8.5(L)  Total Protein 6.5 - 8.1 g/dL - - -  Total Bilirubin 0.3 - 1.2 mg/dL - - -  Alkaline Phos 38 - 126 U/L - - -  AST 15 - 41 U/L - - -  ALT 0 - 44 U/L - - -    Impression/Recommendation   Rt foot necrotic wound- S/p Incision and drainage of infection and abscess to the right plantar heel with excision of large necrotic ulcer involving skin subcutaneous tissue and fat and also removal of prominent bone and culture of said bone from the plantar aspect of the right calcaneus.  Currently on zosyn and linezolid and bed side culture is MRSA and pseudomonas Will decide on duration and route once the bone pathology becomes available  CKD- on dialysis   DM  Peripheral neuropathy  AICD  Rt femur hardware  Anemia of chronic disease  Chronic prednisone use   Discussed the management with patient and Podiatrist ID will follow him peripherally this weekend

## 2018-11-25 NOTE — Progress Notes (Signed)
[facility]  ROUNDING NOTE   Subjective:  Patient underwent hemodialysis yesterday. Tolerated well. Wound VAC noted in the right foot.    Objective:  Vital signs in last 24 hours:  Temp:  [96.8 F (36 C)-98.5 F (36.9 C)] 98.2 F (36.8 C) (07/24 1202) Pulse Rate:  [77-96] 88 (07/24 1202) Resp:  [11-35] 17 (07/24 1202) BP: (108-169)/(71-104) 132/83 (07/24 1202) SpO2:  [93 %-100 %] 100 % (07/24 1202)  Weight change:  Filed Weights   11/22/18 0124 11/22/18 0915 11/22/18 1233  Weight: 93 kg 95 kg 92.5 kg    Intake/Output: I/O last 3 completed shifts: In: 499.2 [P.O.:50; I.V.:250; IV Piggyback:199.2] Out: 3810 [Other:3000; Blood:5]   Intake/Output this shift:  Total I/O In: 220 [P.O.:220] Out: -   Physical Exam: General: No acute distress  Head: Normocephalic, atraumatic. Moist oral mucosal membranes  Eyes: Anicteric  Neck: Supple, trachea midline  Lungs:  Clear to auscultation, normal effort  Heart: S1S2 no rubs  Abdomen:  Soft, nontender, bowel sounds present  Extremities: 1+ LE edema, right foot wrapped  Neurologic: Awake, alert, following commands  Skin: No lesions  Access: IJ PermCath.    Basic Metabolic Panel: Recent Labs  Lab 11/21/18 2114 11/22/18 0926 11/23/18 0338 11/24/18 1011 11/25/18 0400  NA 135  --  139  --  139  K 5.6*  --  4.0 4.3 4.0  CL 100  --  102  --  99  CO2 20*  --  26  --  30  GLUCOSE 217*  --  104*  --  184*  BUN 53*  --  33*  --  18  CREATININE 9.07*  --  5.79*  --  4.41*  CALCIUM 8.8*  --  8.5*  --  8.2*  PHOS  --  5.5*  --   --  4.0    Liver Function Tests: Recent Labs  Lab 11/21/18 2114 11/25/18 0400  AST 18  --   ALT 14  --   ALKPHOS 108  --   BILITOT 0.6  --   PROT 7.9  --   ALBUMIN 3.5 2.8*   No results for input(s): LIPASE, AMYLASE in the last 168 hours. No results for input(s): AMMONIA in the last 168 hours.  CBC: Recent Labs  Lab 11/21/18 2114 11/23/18 0338 11/25/18 0400  WBC 11.7*  10.0 10.7*  HGB 9.5* 9.1* 9.2*  HCT 31.6* 30.8* 30.7*  MCV 91.3 90.9 90.6  PLT 269 297 302    Cardiac Enzymes: No results for input(s): CKTOTAL, CKMB, CKMBINDEX, TROPONINI in the last 168 hours.  BNP: Invalid input(s): POCBNP  CBG: Recent Labs  Lab 11/24/18 1058 11/24/18 1251 11/24/18 2104 11/25/18 0747 11/25/18 1115  GLUCAP 114* 109* 110* 172* 201*    Microbiology: Results for orders placed or performed during the hospital encounter of 11/21/18  SARS Coronavirus 2 (CEPHEID - Performed in [place] hospital lab), Hosp Order     Status: None   Collection Time: 11/21/18  6:42 PM   Specimen: Nasopharyngeal Swab  Result Value Ref Range Status   SARS Coronavirus 2 NEGATIVE NEGATIVE Final    Comment: (NOTE) If result is NEGATIVE SARS-CoV-2 target nucleic acids are NOT DETECTED. The SARS-CoV-2 RNA is generally detectable in upper and lower  respiratory specimens during the acute phase of infection. The lowest  concentration of SARS-CoV-2 viral copies this assay can detect is 250  copies / mL. A negative result does not preclude SARS-CoV-2 infection  and should not  be used as the sole basis for treatment or other  patient management decisions.  A negative result may occur with  improper specimen collection / handling, submission of specimen other  than nasopharyngeal swab, presence of viral mutation(s) within the  areas targeted by this assay, and inadequate number of viral copies  (<250 copies / mL). A negative result must be combined with clinical  observations, patient history, and epidemiological information. If result is POSITIVE SARS-CoV-2 target nucleic acids are DETECTED. The SARS-CoV-2 RNA is generally detectable in upper and lower  respiratory specimens dur ing the acute phase of infection.  Positive  results are indicative of active infection with SARS-CoV-2.  Clinical  correlation with patient history and other diagnostic information is  necessary to  determine patient infection status.  Positive results do  not rule out bacterial infection or co-infection with other viruses. If result is PRESUMPTIVE POSTIVE SARS-CoV-2 nucleic acids MAY BE PRESENT.   A presumptive positive result was obtained on the submitted specimen  and confirmed on repeat testing.  While 2019 novel coronavirus  (SARS-CoV-2) nucleic acids may be present in the submitted sample  additional confirmatory testing may be necessary for epidemiological  and / or clinical management purposes  to differentiate between  SARS-CoV-2 and other Sarbecovirus currently known to infect humans.  If clinically indicated additional testing with an alternate test  methodology [phone]) is advised. The SARS-CoV-2 RNA is generally  detectable in upper and lower respiratory sp ecimens during the acute  phase of infection. The expected result is Negative. Fact Sheet for Patients:  StrictlyIdeas.no Fact Sheet for Healthcare Providers: BankingDealers.co.za This test is not yet approved or cleared by the [place] FDA and has been authorized for detection and/or diagnosis of SARS-CoV-2 by FDA under an Emergency Use Authorization (EUA).  This EUA will remain in effect (meaning this test can be used) for the duration of the COVID-19 declaration under Section 564(b)(1) of the Act, 21 U.S.C. section 360bbb-3(b)(1), unless the authorization is terminated or revoked sooner. Performed at [facility], [address] [place], [place] [address]   MRSA PCR Screening     Status: Abnormal   Collection Time: 11/22/18  9:38 AM   Specimen: Nasal Mucosa; Nasopharyngeal  Result Value Ref Range Status   MRSA by PCR POSITIVE (A) NEGATIVE Final    Comment:        The GeneXpert MRSA Assay (FDA approved for NASAL specimens only), is one component of a comprehensive MRSA colonization surveillance program. It is not intended to diagnose  MRSA infection nor to guide or monitor treatment for MRSA infections. RESULT CALLED TO, READ BACK BY AND VERIFIED WITH: JASMINE MCLENDON AT 1526 ON 11/22/2018. TIK Performed at [facility], [address] [place], [place] [address]   Aerobic/Anaerobic Culture (surgical/deep wound)     Status: None (Preliminary result)   Collection Time: 11/22/18  1:50 PM   Specimen: Wound  Result Value Ref Range Status   Specimen Description   Final    WOUND Performed at [facility], [address] [place], [place] [address]    Special Requests   Final    Immunocompromised,RT HEEL Performed at [facility], [address] [place], [place] [address]    Gram Stain   Final    FEW WBC PRESENT, PREDOMINANTLY PMN FEW GRAM POSITIVE COCCI    Culture   Final    FEW PSEUDOMONAS AERUGINOSA MODERATE STAPHYLOCOCCUS AUREUS SUSCEPTIBILITIES TO FOLLOW Performed at [facility],  [address] [place], [place] [address]    Report Status PENDING  Incomplete  CULTURE, BLOOD (ROUTINE X 2) w Reflex to ID Panel     Status: None (Preliminary result)   Collection Time: 11/22/18  7:52 PM   Specimen: BLOOD  Result Value Ref Range Status   Specimen Description BLOOD LEFT ANTECUBITAL  Final   Special Requests   Final    BOTTLES DRAWN AEROBIC AND ANAEROBIC Blood Culture adequate volume   Culture   Final    NO GROWTH 3 DAYS Performed at [facility], [address], [place], [place] [address]    Report Status PENDING  Incomplete  CULTURE, BLOOD (ROUTINE X 2) w Reflex to ID Panel     Status: None (Preliminary result)   Collection Time: 11/22/18  7:52 PM   Specimen: BLOOD  Result Value Ref Range Status   Specimen Description BLOOD BLOOD RIGHT ARM  Final   Special Requests   Final    BOTTLES DRAWN AEROBIC ONLY Blood Culture results may not be optimal due to an inadequate volume of blood received in culture bottles   Culture   Final    NO GROWTH 3 DAYS Performed  at [facility], [address], [place], [place] [address]    Report Status PENDING  Incomplete  Aerobic/Anaerobic Culture (surgical/deep wound)     Status: None (Preliminary result)   Collection Time: 11/24/18 12:21 PM   Specimen: [facility] Bone biopsy; Tissue  Result Value Ref Range Status   Specimen Description   Final    HEEL Performed at [facility], [address], [place], [place] [address]    Special Requests   Final    BONE CULTURE RIGHT HEEL Performed at [facility], [address] [place], [place] [address]    Gram Stain   Final    NO WBC SEEN FEW GRAM POSITIVE COCCI Performed at [facility], [place] [address], [place], [place] [address]    Culture PENDING  Incomplete   Report Status PENDING  Incomplete    Coagulation Studies: No results for input(s): LABPROT, INR in the last 72 hours.  Urinalysis: No results for input(s): COLORURINE, LABSPEC, PHURINE, GLUCOSEU, HGBUR, BILIRUBINUR, KETONESUR, PROTEINUR, UROBILINOGEN, NITRITE, LEUKOCYTESUR in the last 72 hours.  Invalid input(s): APPERANCEUR    Imaging: No results found.   Medications:   . piperacillin-tazobactam (ZOSYN)  IV 3.375 g (11/25/18 0435)   . allopurinol  100 mg Oral Daily  . amiodarone  100 mg Oral Daily  . aspirin EC  81 mg Oral Daily  . carvedilol  3.125 mg Oral BID  . Chlorhexidine Gluconate Cloth  6 each Topical Q0600  . Chlorhexidine Gluconate Cloth  6 each Topical Q0600  . clopidogrel  75 mg Oral Q breakfast  . dicyclomine  10 mg Oral TID AC & HS  . epoetin (EPOGEN/PROCRIT) injection  4,000 Units Intravenous Q T,Th,Sa-HD  . gabapentin  100 mg Oral TID  . insulin aspart  0-5 Units Subcutaneous QHS  . insulin aspart  0-9 Units Subcutaneous TID WC  . linezolid  600 mg Oral Q12H  . mupirocin ointment  1 application Nasal BID  . predniSONE  10 mg Oral Q breakfast   HYDROcodone-acetaminophen, loperamide  Assessment/ Plan:  53 y.o. male  with type 2  diabetes, diabetic neuropathy, hypertension, coronary disease, congestive heart failure with LVEF of 25 to 30% in January 6433, grade 1 diastolic dysfunction, ICD, history of CABG in 2010, carotid  stenosis, stroke, obstructive sleep apnea, peripheral vascular disease, gout, osteomyelitis of heel  [facility] [place]  1.  ESRD on HD TTS.  Patient completed dialysis yesterday.  We will plan for dialysis again tomorrow if still here.  2.  Hyperkalemia.  Resolved with dialysis treatments.  3.  Secondary hyperparathyroidism.  Phosphorus at target at 4.0.  Continue to monitor.  4.  Hypertension.  Continue carvedilol 3.125 mg p.o. twice daily.  5.  Anemia of chronic kidney disease.  Hemoglobin up to 9.2.  Maintain the patient on current dosage of Epogen.   LOS: 4 Mithra Spano 7/24/202012:05 PM

## 2018-11-25 NOTE — TOC Progression Note (Signed)
Transition of Care (TOC) - Progression Note    Patient Details  Name: Paul [name]. MRN: [id number] Date of Birth: 12/10/1965  Transition of Care [facility]) CM/SW Contact  [name], RN Phone Number: 11/25/2018, 8:54 AM  Clinical Narrative:    Spoke with the patient and he does not have any bed days available for Rehab.  I will get a light weight wheel chair ordered to aide with the non weight bearing, it will also assist him getting into dialysis without bearing weight on that foot, he is already open with [facility] and I alerted Corene Cornea of the need for full [facility] including wound care   Expected Discharge Plan: Skilled Nursing Facility Barriers to Discharge: Continued Medical Work up  Expected Discharge Plan and Services Expected Discharge Plan: [place]   Discharge Planning Services: CM Consult   Living arrangements for the past 2 months: [place]                 DME Arranged: N/A                     Social Determinants of Health (SDOH) Interventions    Readmission Risk Interventions Readmission Risk Prevention Plan 10/06/2018 09/09/2018 08/02/2018  Transportation Screening Complete Complete Complete  PCP or Specialist Appt within 3-5 Days Complete Complete -  HRI or Home Care Consult Complete Complete Complete  Social Work Consult for [place] Planning/Counseling Complete Not Complete Complete  SW consult not completed comments - NA -  Palliative Care Screening Not Applicable Not Applicable Not Applicable  Medication Review (RN Care Manager) - - Complete  Some recent data might be hidden

## 2018-11-25 NOTE — Progress Notes (Signed)
Patient suffers from OsteoMyelitis which impairs their ability to perform daily activities like ADLs in the home.  A walking aid will not resolve  issue with performing activities of daily living. A wheelchair will allow patient to safely perform daily activities. Patient is not able to propel themselves in the home using a standard weight wheelchair due to weakness. Patient can self propel in the lightweight wheelchair. Length of need 99 months. Accessories: elevating leg rests ELRs, wheel locks, extensions and anti-tippers.Back Cushion

## 2018-11-26 LAB — GLUCOSE, CAPILLARY
Glucose-Capillary: 169 mg/dL — ABNORMAL HIGH (ref 70–99)
Glucose-Capillary: 157 mg/dL — ABNORMAL HIGH (ref 70–99)
Glucose-Capillary: 145 mg/dL — ABNORMAL HIGH (ref 70–99)

## 2018-11-26 LAB — RENAL FUNCTION PANEL
Albumin: 2.8 g/dL — ABNORMAL LOW (ref 3.5–5.0)
Anion gap: 12 (ref 5–15)
BUN: 32 mg/dL — ABNORMAL HIGH (ref 6–20)
CO2: 26 mmol/L (ref 22–32)
Calcium: 8.3 mg/dL — ABNORMAL LOW (ref 8.9–10.3)
Chloride: 99 mmol/L (ref 98–111)
Creatinine, Ser: 6.12 mg/dL — ABNORMAL HIGH (ref 0.61–1.24)
GFR calc Af Amer: 11 mL/min — ABNORMAL LOW (ref >60)
GFR calc non Af Amer: 10 mL/min — ABNORMAL LOW (ref >60)
Glucose, Bld: 185 mg/dL — ABNORMAL HIGH (ref 70–99)
Phosphorus: 5.1 mg/dL — ABNORMAL HIGH (ref 2.5–4.6)
Potassium: 4.3 mmol/L (ref 3.5–5.1)
Sodium: 137 mmol/L (ref 135–145)

## 2018-11-26 LAB — CBC
HCT: 31 % — ABNORMAL LOW (ref 39.0–52.0)
Hemoglobin: 9.5 g/dL — ABNORMAL LOW (ref 13.0–17.0)
MCH: 27.5 pg (ref 26.0–34.0)
MCHC: 30.6 g/dL (ref 30.0–36.0)
MCV: 89.6 fL (ref 80.0–100.0)
Platelets: 346 10*3/uL (ref 150–400)
RBC: 3.46 MIL/uL — ABNORMAL LOW (ref 4.22–5.81)
RDW: 16.7 % — ABNORMAL HIGH (ref 11.5–15.5)
WBC: 12.7 10*3/uL — ABNORMAL HIGH (ref 4.0–10.5)
nRBC: 0 % (ref 0.0–0.2)

## 2018-11-26 LAB — HEPATITIS B CORE ANTIBODY, IGM: Hep B C IgM: NEGATIVE

## 2018-11-26 LAB — HEPATITIS B SURFACE ANTIBODY, QUANTITATIVE: Hep B S AB Quant (Post): <3.1 m[IU]/mL — ABNORMAL LOW (ref >9.9)

## 2018-11-26 LAB — PHOSPHORUS: Phosphorus: 5.3 mg/dL — ABNORMAL HIGH (ref 2.5–4.6)

## 2018-11-26 MED ORDER — EPOETIN ALFA 10000 UNIT/ML IJ SOLN: 4000.0000 [IU] | INTRAMUSCULAR | Status: DC

## 2018-11-26 MED ORDER — EPOETIN ALFA 4000 UNIT/ML IJ SOLN
4000.0000 [IU] | INTRAMUSCULAR | Status: DC
  Filled 2018-11-26: qty 1

## 2018-11-26 MED ORDER — EPOETIN ALFA 4000 UNIT/ML IJ SOLN
4000.0000 [IU] | INTRAMUSCULAR | Status: DC
  Administered 2018-11-26: 4000 [IU] via INTRAVENOUS
  Filled 2018-11-26 (×3): qty 1

## 2018-11-26 NOTE — Progress Notes (Signed)
[facility] Podiatry                                                      Patient Demographics  Paul [name], is a 53 y.o. male   MRN: [id number]   DOB - [birth year]  Admit Date - 11/21/2018    Outpatient Primary MD for the patient is Tracie Harrier, MD  Consult requested in the Hospital by Epifanio Lesches, MD, On 11/26/2018    Past Medical History:  Diagnosis Date  . Anemia   . Cardiac defibrillator in place    a. Biotronik LUmax 540 DRT, (ser # [id number]).  . Carotid arterial disease ([place])    a. s/p prior LICA stenting;  b. 11/5168 Carotid U/S: 40-59% bilat ICA stenosis. Patent LICA stent.  . CHF (congestive heart failure) ([place])   . CKD (chronic kidney disease), stage III ([place])   . Coronary artery disease    a. 2010 s/p CABG x 3.  . DDD (degenerative disc disease), lumbosacral    L5-S1  . Diabetes ([place])    Lantus at bedtime  . Gangrene of toe of right foot ([place])   . Gout of left hand 10/06/2016  . HFrEF (heart failure with reduced ejection fraction) ([place])    a. 07/2016 Echo: EF 25-30%, diff HK, mild MR, mildly dil LA, mod reduced RV fxn, PASP 70mmHg.  Marland Kitchen Hypertension    takes Coreg daily  . Ischemic cardiomyopathy    a. 07/2016 Echo: EF 25-30%, diff HK.  . Myocardial infarction ([place]) 2009  . Peripheral vascular disease ([place])   . Sleep apnea    sleep study yr ago-unable to afford cpap  . Stroke [facility]) 09   no weakness      Past Surgical History:  Procedure Laterality Date  . AMPUTATION TOE Right 08/22/2016   Procedure: AMPUTATION TOE;  Surgeon: Samara Deist, DPM;  Location: [facility] ORS;  Service: Podiatry;  Laterality: Right;  . AMPUTATION TOE Right 10/09/2016   Procedure: AMPUTATION TOE-RIGHT 2ND MPJ;  Surgeon: Samara Deist, DPM;  Location: [facility] OR[facility];  Service: Podiatry;  Laterality: Right;  . APLIGRAFT PLACEMENT Right 10/09/2016   Procedure:  APLIGRAFT PLACEMENT;  Surgeon: Samara Deist, DPM;  Location: [facility];  Service: Podiatry;  Laterality: Right;  . CALCANEAL OSTEOTOMY Bilateral 07/28/2018   Procedure: RIGHT CALCANECTOMY BILATERAL DEBRIDEMENT OF ULCERS ON HEELS;  Surgeon: Albertine Patricia, DPM;  Location: [facility] OR[facility];  Service: Podiatry;  Laterality: Bilateral;  . CARDIAC DEFIBRILLATOR PLACEMENT  2011  . CAROTID ENDARTERECTOMY Left   . CHOLECYSTECTOMY  2010  . CORONARY ARTERY BYPASS GRAFT  2010   CABG x 3   . DIALYSIS/PERMA CATHETER INSERTION N/A 10/07/2018   Procedure: DIALYSIS/PERMA CATHETER INSERTION;  Surgeon: Katha Cabal, MD;  Location: [place] [facility];  Service: Cardiovascular;  Laterality: N/A;  . GRAFT APPLICATION Right 0/17/4944   Procedure: FULL THICKNESS SKIN GRAFT-RIGHT FOOT;  Surgeon: Algernon Huxley, MD;  Location: [facility];  Service: Vascular;  Laterality: Right;  . HIP SURGERY Right 1994  . INCISION AND DRAINAGE Right 09/08/2018   Procedure: INCISION AND DRAINAGE - [place] OF DEFECTIVE SKIN, SOFT TISSUE AND BONE;  Surgeon: Albertine Patricia, DPM;  Location: [facility];  Service: Podiatry;  Laterality: Right;  . INCISION AND DRAINAGE OF WOUND Right 08/22/2016   Procedure: IRRIGATION AND DEBRIDEMENT WOUND and wound  vac placement;  Surgeon: Samara Deist, DPM;  Location: [facility];  Service: Podiatry;  Laterality: Right;  . IRRIGATION AND DEBRIDEMENT ABSCESS Right 11/24/2018   Procedure: IRRIGATION AND DEBRIDEMENT ABSCESS RIGHT FOOT, COMPLICATED, DIABETIC;  Surgeon: Albertine Patricia, DPM;  Location: [facility];  Service: Podiatry;  Laterality: Right;  . LOWER EXTREMITY ANGIOGRAPHY Right 08/24/2016   Procedure: Lower Extremity Angiography;  Surgeon: Algernon Huxley, MD;  Location: [place] [facility];  Service: Cardiovascular;  Laterality: Right;  . LOWER EXTREMITY ANGIOGRAPHY Right 07/27/2018   Procedure: RIGHT Lower Extremity Angiography;  Surgeon: Algernon Huxley, MD;  Location: [place] [facility];  Service:  Cardiovascular;  Laterality: Right;  . LOWER EXTREMITY ANGIOGRAPHY Right 09/09/2018   Procedure: Lower Extremity Angiography;  Surgeon: Katha Cabal, MD;  Location: [place] [facility];  Service: Cardiovascular;  Laterality: Right;  . MASS EXCISION Right 07/18/2014   Procedure: EXCISION HETEROTOPIC BONE RIGHT HIP;  Surgeon: Frederik Pear, MD;  Location: [place];  Service: Orthopedics;  Laterality: Right;  . Open Heart Surgery  2010   x 3  . WOUND DEBRIDEMENT Right 10/09/2016   Procedure: DEBRIDEMENT WOUND;  Surgeon: Samara Deist, DPM;  Location: [facility];  Service: Podiatry;  Laterality: Right;    in for   No chief complaint on file.    HPI  Paul [name]  is a 53 y.o. male, 2 days status post incision and drainage of infected right heel along with excisional debridement of infected skin soft tissue and bone from the area as well.  Wound VAC is on intact in place at present.    Review of Systems alert well oriented no complaints  In addition to the HPI above,  No Fever-chills, No Headache, No changes with Vision or hearing, No problems swallowing food or Liquids, No Chest pain, Cough or Shortness of Breath, No Abdominal pain, No Nausea or Vommitting, Bowel movements are regular, No Blood in stool or Urine, No dysuria, No new skin rashes or bruises, No new joints pains-aches,  No new weakness, tingling, numbness in any extremity, No recent weight gain or loss, No polyuria, polydypsia or polyphagia, No significant Mental Stressors.  A full 10 point Review of Systems was done, except as stated above, all other Review of Systems were negative.   Social History Social History   Tobacco Use  . Smoking status: Never Smoker  . Smokeless tobacco: Never Used  Substance Use Topics  . Alcohol use: No    Family History Family History  Problem Relation Age of Onset  . Hypertension Other   . Diabetes Other   . Diabetes Father   . Hyperlipidemia Father   . Hypertension Father    . Hyperlipidemia Sister   . Hypertension Sister   . Diabetes Sister   . Diabetes Brother   . Hypertension Brother   . Hyperlipidemia Brother     Prior to Admission medications   Medication Sig Start Date End Date Taking? Authorizing Provider  allopurinol (ZYLOPRIM) 100 MG tablet Take 1 tablet (100 mg total) by mouth daily. 08/02/18  Yes Fritzi Mandes, MD  amiodarone (PACERONE) 100 MG tablet Take 100 mg by mouth daily.  01/15/18  Yes [provider]  aspirin EC 81 MG tablet Take 1 tablet (81 mg total) by mouth daily. 10/21/16  Yes Theora Gianotti, NP  carvedilol (COREG) 3.125 MG tablet Take 1 tablet (3.125 mg total) by mouth 2 (two) times daily. 05/29/18  Yes Loletha Grayer, MD  clopidogrel (PLAVIX) 75 MG  tablet Take 1 tablet (75 mg total) by mouth daily with breakfast. 09/12/18  Yes Mayo, Pete Pelt, MD  dicyclomine (BENTYL) 10 MG capsule Take 1 capsule (10 mg total) by mouth 4 (four) times daily -  before meals and at bedtime. 10/11/18  Yes Wieting, [name], MD  gabapentin (NEURONTIN) 100 MG capsule Take 1 capsule (100 mg total) by mouth 3 (three) times daily. 10/11/18  Yes Wieting, [name], MD  loperamide (IMODIUM) 2 MG capsule Take 1 capsule (2 mg total) by mouth every 8 (eight) hours as needed for diarrhea or loose stools. 10/11/18  Yes Wieting, [name], MD  predniSONE (DELTASONE) 5 MG tablet Take 1 tablet (5 mg total) by mouth daily with breakfast. 08/02/18  Yes Fritzi Mandes, MD  torsemide (DEMADEX) 20 MG tablet Take 40 mg (two tablets) on days that you do not have dialysis Patient not taking: Reported on 10/31/2018 10/11/18   Loletha Grayer, MD    Anti-infectives (From admission, onward)   Start     Dose/Rate Route Frequency Ordered Stop   11/24/18 1216  vancomycin (VANCOCIN) powder  Status:  Discontinued       As needed 11/24/18 1216 11/24/18 1251   11/24/18 1216  gentamicin (GARAMYCIN) injection  Status:  Discontinued       As needed 11/24/18 1217 11/24/18 1251   11/22/18  1800  piperacillin-tazobactam (ZOSYN) IVPB 3.375 g     3.375 g 12.5 mL/hr over 240 Minutes Intravenous Every 12 hours 11/22/18 1544     11/22/18 1600  linezolid (ZYVOX) tablet 600 mg     600 mg Oral Every 12 hours 11/22/18 1544     11/22/18 1200  vancomycin (VANCOCIN) IVPB 1000 mg/200 mL premix  Status:  Discontinued     1,000 mg 200 mL/hr over 60 Minutes Intravenous Every T-Th-Sa (Hemodialysis) 11/21/18 1955 11/22/18 1545   11/22/18 1200  ceFEPIme (MAXIPIME) 2 g in sodium chloride 0.9 % 100 mL IVPB  Status:  Discontinued     2 g 200 mL/hr over 30 Minutes Intravenous Once per day on Tue Thu Sat 11/21/18 2007 11/22/18 1541   11/21/18 2000  vancomycin (VANCOCIN) 2,000 mg in sodium chloride 0.9 % 500 mL IVPB  Status:  Discontinued     2,000 mg 250 mL/hr over 120 Minutes Intravenous  Once 11/21/18 1927 11/22/18 1528   11/21/18 1900  ceFEPIme (MAXIPIME) 2 g in sodium chloride 0.9 % 100 mL IVPB     2 g 200 mL/hr over 30 Minutes Intravenous  Once 11/21/18 1856 11/21/18 2355      Scheduled Meds: . allopurinol  100 mg Oral Daily  . amiodarone  100 mg Oral Daily  . aspirin EC  81 mg Oral Daily  . carvedilol  3.125 mg Oral BID  . Chlorhexidine Gluconate Cloth  6 each Topical Q0600  . Chlorhexidine Gluconate Cloth  6 each Topical Q0600  . clopidogrel  75 mg Oral Q breakfast  . dicyclomine  10 mg Oral TID AC & HS  . epoetin (EPOGEN/PROCRIT) injection  4,000 Units Intravenous Q T,Th,Sa-HD  . gabapentin  100 mg Oral TID  . insulin aspart  0-5 Units Subcutaneous QHS  . insulin aspart  0-9 Units Subcutaneous TID WC  . linezolid  600 mg Oral Q12H  . mupirocin ointment  1 application Nasal BID  . predniSONE  10 mg Oral Q breakfast   Continuous Infusions: . piperacillin-tazobactam (ZOSYN)  IV 3.375 g (11/26/18 0522)   PRN Meds:.HYDROcodone-acetaminophen, loperamide  Allergies  Allergen Reactions  .  Other Other (See Comments)    Cardiac Problems. Pt states he tolerates Toradol. Due to  kidney and heart problems per pt  . Ibuprofen Other (See Comments)    Heart problems  . Baclofen Other (See Comments)  . Metformin Diarrhea  . Nsaids     Due to kidney and heart problems per pt    Physical Exam  Vitals  Blood pressure (!) 159/100, pulse 89, temperature (!) 97.5 F (36.4 C), temperature source Oral, resp. rate 18, height 5\' 10"  (1.778 m), weight 92.5 kg, SpO2 95 %.  Lower Extremity exam: Wound VAC removed today the plantar wound looks fairly clean.  Fairly healthy tissue was noted.  Beads are in place across the superficial aspect of the wound as well as into the deeper tissues.  Beads have vancomycin and gentamicin.  Wound VAC is been in place and functional. Posterior heel wound is checked also he still has significant healing to that area.  There is a small wound in that region but it appears to be granular. Data Review  CBC Recent Labs  Lab 11/21/18 2114 11/23/18 0338 11/25/18 0400 11/26/18 0502  WBC 11.7* 10.0 10.7* 12.7*  HGB 9.5* 9.1* 9.2* 9.5*  HCT 31.6* 30.8* 30.7* 31.0*  PLT 269 297 302 346  MCV 91.3 90.9 90.6 89.6  MCH 27.5 26.8 27.1 27.5  MCHC 30.1 29.5* 30.0 30.6  RDW 17.2* 17.2* 17.0* 16.7*   ------------------------------------------------------------------------------------------------------------------  Chemistries  Recent Labs  Lab 11/21/18 2114 11/23/18 0338 11/24/18 1011 11/25/18 0400 11/26/18 0502  NA 135 139  --  139 137  K 5.6* 4.0 4.3 4.0 4.3  CL 100 102  --  99 99  CO2 20* 26  --  30 26  GLUCOSE 217* 104*  --  184* 185*  BUN 53* 33*  --  18 32*  CREATININE 9.07* 5.79*  --  4.41* 6.12*  CALCIUM 8.8* 8.5*  --  8.2* 8.3*  AST 18  --   --   --   --   ALT 14  --   --   --   --   ALKPHOS 108  --   --   --   --   BILITOT 0.6  --   --   --   --    ----------------------------------------------------------------------------- Assessment & Plan: Overall the wound looks significantly better and hopefully is stabilizing.   Cultures included Pseudomonas and MRSA.  He has been followed by infectious disease this point.  We will change wound VAC today and put it new and on again on Monday.  We will check things as progression at that point.  Will probably need to be in the hospital through next week.  Active Problems:   Osteomyelitis [facility])   Family Communication: Plan discussed with patient.  Albertine Patricia M.D on 11/26/2018 at 10:30 AM  Thank you for the consult, we will follow the patient with you in the Hospital.

## 2018-11-26 NOTE — Progress Notes (Signed)
[place] [facility] [place] NAME: Paul [name]    MR#:  [id number]  DATE OF BIRTH:  19-Jan-1966  SUBJECTIVE:   C/o throbbing pain in the right leg.    REVIEW OF SYSTEMS:  CONSTITUTIONAL: No fever, fatigue or weakness.  EYES: No blurred or double vision.  EARS, NOSE, AND THROAT: No tinnitus or ear pain.  RESPIRATORY: No cough, shortness of breath, wheezing or hemoptysis.  CARDIOVASCULAR: No chest pain, orthopnea, edema.  GASTROINTESTINAL: No nausea, vomiting, diarrhea or abdominal pain.  GENITOURINARY: No dysuria, hematuria.  ENDOCRINE: No polyuria, nocturia,  HEMATOLOGY: No anemia, easy bruising or bleeding SKIN: No rash or lesion. MUSCULOSKELETAL: Right foot pain, bilateral hand deformities.  NEUROLOGIC: No tingling, numbness, weakness.  PSYCHIATRY: No anxiety or depression.   DRUG ALLERGIES:   Allergies  Allergen Reactions  . Other Other (See Comments)    Cardiac Problems. Pt states he tolerates Toradol. Due to kidney and heart problems per pt  . Ibuprofen Other (See Comments)    Heart problems  . Baclofen Other (See Comments)  . Metformin Diarrhea  . Nsaids     Due to kidney and heart problems per pt    VITALS:  Blood pressure (!) 159/100, pulse 89, temperature (!) 97.5 F (36.4 C), temperature source Oral, resp. rate 18, height 5\' 10"  (1.778 m), weight 92.5 kg, SpO2 95 %.  PHYSICAL EXAMINATION:  GENERAL:  52 y.o.-year-old patient sitting up on edge of bed with no acute distress.  EYES: Pupils equal, round, reactive to light and accommodation. No scleral icterus. Extraocular muscles intact.  HEENT: Head atraumatic, normocephalic. Oropharynx and nasopharynx clear.  NECK:  Supple, no jugular venous distention. No thyroid enlargement, no tenderness.  LUNGS: Normal breath sounds bilaterally, no wheezing, rales,rhonchi or crepitation. No use of accessory muscles of respiration.  CARDIOVASCULAR: S1, S2 normal. No murmurs, rubs, or  gallops.  ABDOMEN: Soft, nontender, nondistended. Bowel sounds present. No organomegaly or mass.  EXTREMITIES: No pedal edema, cyanosis, or clubbing. 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  Bilateral hand numbness. NEUROLOGIC: Cranial nerves II through XII are intact. Muscle strength 5/5 in all extremities. Sensation intact. Gait not checked.  PSYCHIATRIC: The patient is alert and oriented x 3.  SKIN: +wound vac and dry dressing place over right foot.  LABORATORY PANEL:   CBC Recent Labs  Lab 11/26/18 0502  WBC 12.7*  HGB 9.5*  HCT 31.0*  PLT 346   ------------------------------------------------------------------------------------------------------------------  Chemistries  Recent Labs  Lab 11/21/18 2114  11/26/18 0502  NA 135   < > 137  K 5.6*   < > 4.3  CL 100   < > 99  CO2 20*   < > 26  GLUCOSE 217*   < > 185*  BUN 53*   < > 32*  CREATININE 9.07*   < > 6.12*  CALCIUM 8.8*   < > 8.3*  AST 18  --   --   ALT 14  --   --   ALKPHOS 108  --   --   BILITOT 0.6  --   --    < > = values in this interval not displayed.   ------------------------------------------------------------------------------------------------------------------  Cardiac Enzymes No results for input(s): TROPONINI in the last 168 hours. ------------------------------------------------------------------------------------------------------------------  RADIOLOGY:  No results found.  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 -NWB to right foot  ESRD on HD TTS- stable, after last discharge patient still had to ambulate to go for the  hemodialysis which aggravated the leg infection. -Nephrology following  Paroxysmal atrial fibrillation-patient has been in normal sinus rhythm here. -Continue aspirin, Plavix, Coreg  Chronic systolic congestive heart failure- stable, no signs of volume overload -Management per nephrology  Gout of the bilateral hands- improving -Continue prednisone and allopurinol   Type 2 diabetes mellitus -Continue SSI  Chronic diarrhea -Continue Bentyl and Imodium  Anemia of chronic kidney disease- hemoglobin is at baseline -EPO with dialysis Cervical degenerative disc disease with bilateral hand numbness, patient takes prednisone, he is told me that he is due for neck surgery.   PT recommended discharge to SNF, but patient is out of bed days, so he will need to be discharged home with maximum home health services. Wheelchair has already been ordered.  All the records are reviewed and case discussed with Care Management/Social Workerr. Management plans discussed with the patient, family and they are in agreement.  CODE STATUS: Full.  TOTAL TIME TAKING CARE OF THIS PATIENT: 33 minutes.    POSSIBLE D/C IN 1-2 DAYS, DEPENDING ON CLINICAL CONDITION.   Epifanio Lesches M.D on 11/26/2018   Between 7am to 6pm - Pager - [phone]  After 6pm go to [email or web address] - password [email or web address] [place] [facility]  Office  [phone]  CC: Primary care physician; Tracie Harrier, MD  Note: This dictation was prepared with Dragon dictation along with smaller phrase technology. Any transcriptional errors that result from this process are unintentional.

## 2018-11-26 NOTE — Progress Notes (Signed)
[facility]  ROUNDING NOTE   Subjective:  Patient seen at bedside. Due for dialysis today. His heel appears to be heeling.    Objective:  Vital signs in last 24 hours:  Temp:  [97.5 F (36.4 C)-98.3 F (36.8 C)] 97.5 F (36.4 C) (07/25 0806) Pulse Rate:  [87-90] 89 (07/25 0806) Resp:  [18] 18 (07/25 0029) BP: (138-159)/(86-100) 159/100 (07/25 0806) SpO2:  [95 %-100 %] 95 % (07/25 0806)  Weight change:  Filed Weights   11/22/18 0124 11/22/18 0915 11/22/18 1233  Weight: 93 kg 95 kg 92.5 kg    Intake/Output: I/O last 3 completed shifts: In: 559.9 [P.O.:460; IV Piggyback:99.9] Out: 3025 [Drains:25; Other:3000]   Intake/Output this shift:  Total I/O In: 76.4 [IV Piggyback:76.4] Out: -   Physical Exam: General: No acute distress  Head: Normocephalic, atraumatic. Moist oral mucosal membranes  Eyes: Anicteric  Neck: Supple, trachea midline  Lungs:  Clear to auscultation, normal effort  Heart: S1S2 no rubs  Abdomen:  Soft, nontender, bowel sounds present  Extremities: 1+ LE edema, right foot wrapped  Neurologic: Awake, alert, following commands  Skin: No lesions  Access: IJ PermCath.    Basic Metabolic Panel: Recent Labs  Lab 11/21/18 2114 11/22/18 0926 11/23/18 0338 11/24/18 1011 11/25/18 0400 11/26/18 0502  NA 135  --  139  --  139 137  K 5.6*  --  4.0 4.3 4.0 4.3  CL 100  --  102  --  99 99  CO2 20*  --  26  --  30 26  GLUCOSE 217*  --  104*  --  184* 185*  BUN 53*  --  33*  --  18 32*  CREATININE 9.07*  --  5.79*  --  4.41* 6.12*  CALCIUM 8.8*  --  8.5*  --  8.2* 8.3*  PHOS  --  5.5*  --   --  4.0 5.1*    Liver Function Tests: Recent Labs  Lab 11/21/18 2114 11/25/18 0400 11/26/18 0502  AST 18  --   --   ALT 14  --   --   ALKPHOS 108  --   --   BILITOT 0.6  --   --   PROT 7.9  --   --   ALBUMIN 3.5 2.8* 2.8*   No results for input(s): LIPASE, AMYLASE in the last 168 hours. No results for input(s): AMMONIA in the last 168  hours.  CBC: Recent Labs  Lab 11/21/18 2114 11/23/18 0338 11/25/18 0400 11/26/18 0502  WBC 11.7* 10.0 10.7* 12.7*  HGB 9.5* 9.1* 9.2* 9.5*  HCT 31.6* 30.8* 30.7* 31.0*  MCV 91.3 90.9 90.6 89.6  PLT 269 297 302 346    Cardiac Enzymes: No results for input(s): CKTOTAL, CKMB, CKMBINDEX, TROPONINI in the last 168 hours.  BNP: Invalid input(s): POCBNP  CBG: Recent Labs  Lab 11/25/18 1115 11/25/18 1707 11/25/18 2109 11/26/18 0803 11/26/18 1140  GLUCAP 201* 216* 251* 169* 157*    Microbiology: Results for orders placed or performed during the hospital encounter of 11/21/18  SARS Coronavirus 2 (CEPHEID - Performed in [place] hospital lab), Hosp Order     Status: None   Collection Time: 11/21/18  6:42 PM   Specimen: Nasopharyngeal Swab  Result Value Ref Range Status   SARS Coronavirus 2 NEGATIVE NEGATIVE Final    Comment: (NOTE) If result is NEGATIVE SARS-CoV-2 target nucleic acids are NOT DETECTED. The SARS-CoV-2 RNA is generally detectable in upper and lower  respiratory  specimens during the acute phase of infection. The lowest  concentration of SARS-CoV-2 viral copies this assay can detect is 250  copies / mL. A negative result does not preclude SARS-CoV-2 infection  and should not be used as the sole basis for treatment or other  patient management decisions.  A negative result may occur with  improper specimen collection / handling, submission of specimen other  than nasopharyngeal swab, presence of viral mutation(s) within the  areas targeted by this assay, and inadequate number of viral copies  (<250 copies / mL). A negative result must be combined with clinical  observations, patient history, and epidemiological information. If result is POSITIVE SARS-CoV-2 target nucleic acids are DETECTED. The SARS-CoV-2 RNA is generally detectable in upper and lower  respiratory specimens dur ing the acute phase of infection.  Positive  results are indicative of active  infection with SARS-CoV-2.  Clinical  correlation with patient history and other diagnostic information is  necessary to determine patient infection status.  Positive results do  not rule out bacterial infection or co-infection with other viruses. If result is PRESUMPTIVE POSTIVE SARS-CoV-2 nucleic acids MAY BE PRESENT.   A presumptive positive result was obtained on the submitted specimen  and confirmed on repeat testing.  While 2019 novel coronavirus  (SARS-CoV-2) nucleic acids may be present in the submitted sample  additional confirmatory testing may be necessary for epidemiological  and / or clinical management purposes  to differentiate between  SARS-CoV-2 and other Sarbecovirus currently known to infect humans.  If clinically indicated additional testing with an alternate test  methodology [phone]) is advised. The SARS-CoV-2 RNA is generally  detectable in upper and lower respiratory sp ecimens during the acute  phase of infection. The expected result is Negative. Fact Sheet for Patients:  StrictlyIdeas.no Fact Sheet for Healthcare Providers: BankingDealers.co.za This test is not yet approved or cleared by the [place] FDA and has been authorized for detection and/or diagnosis of SARS-CoV-2 by FDA under an Emergency Use Authorization (EUA).  This EUA will remain in effect (meaning this test can be used) for the duration of the COVID-19 declaration under Section 564(b)(1) of the Act, 21 U.S.C. section 360bbb-3(b)(1), unless the authorization is terminated or revoked sooner. Performed at [facility], [address] [place], [place] [address]   MRSA PCR Screening     Status: Abnormal   Collection Time: 11/22/18  9:38 AM   Specimen: Nasal Mucosa; Nasopharyngeal  Result Value Ref Range Status   MRSA by PCR POSITIVE (A) NEGATIVE Final    Comment:        The GeneXpert MRSA Assay (FDA approved for NASAL  specimens only), is one component of a comprehensive MRSA colonization surveillance program. It is not intended to diagnose MRSA infection nor to guide or monitor treatment for MRSA infections. RESULT CALLED TO, READ BACK BY AND VERIFIED WITH: JASMINE MCLENDON AT 1526 ON 11/22/2018. TIK Performed at [facility], [address] [place], [place] [address]   Aerobic/Anaerobic Culture (surgical/deep wound)     Status: None (Preliminary result)   Collection Time: 11/22/18  1:50 PM   Specimen: Wound  Result Value Ref Range Status   Specimen Description   Final    WOUND Performed at [facility], [address] [place], [place] [address]    Special Requests   Final    Immunocompromised,RT HEEL Performed at [facility], [address], [place], [place] [address]    Gram Stain   Final  FEW WBC PRESENT, PREDOMINANTLY PMN FEW GRAM POSITIVE COCCI Performed at [facility], [place] [address] [place], [place] [address]    Culture   Final    FEW PSEUDOMONAS AERUGINOSA MODERATE METHICILLIN RESISTANT STAPHYLOCOCCUS AUREUS NO ANAEROBES ISOLATED; CULTURE IN PROGRESS FOR 5 DAYS    Report Status PENDING  Incomplete   Organism ID, Bacteria PSEUDOMONAS AERUGINOSA  Final   Organism ID, Bacteria METHICILLIN RESISTANT STAPHYLOCOCCUS AUREUS  Final      Susceptibility   Methicillin resistant staphylococcus aureus - MIC*    CIPROFLOXACIN >=8 RESISTANT Resistant     ERYTHROMYCIN >=8 RESISTANT Resistant     GENTAMICIN <=0.5 SENSITIVE Sensitive     OXACILLIN >=4 RESISTANT Resistant     TETRACYCLINE <=1 SENSITIVE Sensitive     VANCOMYCIN 1 SENSITIVE Sensitive     TRIMETH/SULFA >=320 RESISTANT Resistant     CLINDAMYCIN <=0.25 SENSITIVE Sensitive     RIFAMPIN <=0.5 SENSITIVE Sensitive     Inducible Clindamycin NEGATIVE Sensitive     * MODERATE METHICILLIN RESISTANT STAPHYLOCOCCUS AUREUS   Pseudomonas aeruginosa - MIC*    CEFTAZIDIME 4 SENSITIVE Sensitive      CIPROFLOXACIN >=4 RESISTANT Resistant     GENTAMICIN <=1 SENSITIVE Sensitive     IMIPENEM 2 SENSITIVE Sensitive     PIP/TAZO 16 SENSITIVE Sensitive     CEFEPIME 8 SENSITIVE Sensitive     * FEW PSEUDOMONAS AERUGINOSA  CULTURE, BLOOD (ROUTINE X 2) w Reflex to ID Panel     Status: None (Preliminary result)   Collection Time: 11/22/18  7:52 PM   Specimen: BLOOD  Result Value Ref Range Status   Specimen Description BLOOD LEFT ANTECUBITAL  Final   Special Requests   Final    BOTTLES DRAWN AEROBIC AND ANAEROBIC Blood Culture adequate volume   Culture   Final    NO GROWTH 4 DAYS Performed at [facility], [address] [place], [place] [address]    Report Status PENDING  Incomplete  CULTURE, BLOOD (ROUTINE X 2) w Reflex to ID Panel     Status: None (Preliminary result)   Collection Time: 11/22/18  7:52 PM   Specimen: BLOOD  Result Value Ref Range Status   Specimen Description BLOOD BLOOD RIGHT ARM  Final   Special Requests   Final    BOTTLES DRAWN AEROBIC ONLY Blood Culture results may not be optimal due to an inadequate volume of blood received in culture bottles   Culture   Final    NO GROWTH 4 DAYS Performed at [facility], [address] [place], [place] [address]    Report Status PENDING  Incomplete  Aerobic/Anaerobic Culture (surgical/deep wound)     Status: None (Preliminary result)   Collection Time: 11/24/18 12:21 PM   Specimen: [facility] Bone biopsy; Tissue  Result Value Ref Range Status   Specimen Description   Final    HEEL Performed at [facility], [address], [place], [place] [address]    Special Requests   Final    BONE CULTURE RIGHT HEEL Performed at [facility], [address] [place], [place] [address]    Gram Stain   Final    NO WBC SEEN FEW GRAM POSITIVE COCCI Performed at [facility], [place] [address] [place], [place] [address]    Culture FEW STAPHYLOCOCCUS AUREUS  Final   Report Status PENDING   Incomplete    Coagulation Studies: No results for input(s): LABPROT, INR in the last 72 hours.  Urinalysis: No results for input(s): COLORURINE, LABSPEC, PHURINE, GLUCOSEU, HGBUR, BILIRUBINUR, KETONESUR, PROTEINUR, UROBILINOGEN, NITRITE, LEUKOCYTESUR in the last 72 hours.  Invalid input(s): APPERANCEUR    Imaging: No results found.   Medications:   . piperacillin-tazobactam (ZOSYN)  IV 3.375 g (11/26/18 0522)   . allopurinol  100 mg Oral Daily  . amiodarone  100 mg Oral Daily  . aspirin EC  81 mg Oral Daily  . carvedilol  3.125 mg Oral BID  . Chlorhexidine Gluconate Cloth  6 each Topical Q0600  . Chlorhexidine Gluconate Cloth  6 each Topical Q0600  . clopidogrel  75 mg Oral Q breakfast  . dicyclomine  10 mg Oral TID AC & HS  . epoetin (EPOGEN/PROCRIT) injection  4,000 Units Intravenous Q T,Th,Sa-HD  . gabapentin  100 mg Oral TID  . insulin aspart  0-5 Units Subcutaneous QHS  . insulin aspart  0-9 Units Subcutaneous TID WC  . linezolid  600 mg Oral Q12H  . mupirocin ointment  1 application Nasal BID  . predniSONE  10 mg Oral Q breakfast   HYDROcodone-acetaminophen, loperamide  Assessment/ Plan:  53 y.o. male  with type 2 diabetes, diabetic neuropathy, hypertension, coronary disease, congestive heart failure with LVEF of 25 to 30% in January 8875, grade 1 diastolic dysfunction, ICD, history of CABG in 2010, carotid stenosis, stroke, obstructive sleep apnea, peripheral vascular disease, gout, osteomyelitis of heel  [facility]  1.  ESRD on HD TTS.  Patient due for hemodialysis today.  Orders have been prepared.  2.  Hyperkalemia.  Potassium now normalized at 4.3.  3.  Secondary hyperparathyroidism.  Phosphorus 5.1 at last check.  Not on binders at the moment.  4.  Hypertension.  Continue carvedilol 3.125 mg p.o. twice daily.  Blood pressure currently 159/100.  5.  Anemia of chronic kidney disease.  Continue Epogen with dialysis treatments.  LOS:  5 Osie Amparo 7/25/202012:34 PM

## 2018-11-26 NOTE — Progress Notes (Signed)
Patient resting in bed, dressing clean dry and intact on left foot. No complaints. Bed in lowest position. Patient refuses bed alarm. Call bell in reach.

## 2018-11-26 NOTE — Progress Notes (Signed)
HD Tx Start   11/26/18 1630  Hand-Off documentation  Report given to (Full Name) Trellis Paganini RN  Report received from (Full Name) Helen Hashimoto RN  Vital Signs  Temp 97.8 F (36.6 C)  Temp Source Oral  Pulse Rate 81  Pulse Rate Source Monitor  Resp 16  BP (!) 148/94  BP Location Left Arm  BP Method Automatic  Patient Position (if appropriate) Lying  Oxygen Therapy  SpO2 100 %  O2 Device Room Air  Pain Assessment  Pain Scale 0-10  Pain Score 6  Pain Type Acute pain  Pain Location Foot  Pain Orientation Right  Dialysis Weight  Weight 92.5 kg  Type of Weight Pre-Dialysis  Time-Out for Hemodialysis  What Procedure? Hemodialysis  Pt Identifiers(min of two) First/Last Name;MRN/Account#  Correct Site? Yes  Correct Side? Yes  Correct Procedure? Yes  Consents Verified? Yes  Rad Studies Available? N/A  Safety Precautions Reviewed? Yes  Engineer, civil (consulting) Number 4  Station Number 4  UF/Alarm Test Passed  Conductivity: Meter 14  Conductivity: Machine  13.8  pH 7.4  Reverse Osmosis main  Normal Saline Lot Number [id number]  Dialyzer Lot Number [id number]  Disposable Set Lot Number [id number]  Machine Temperature 98.6 F (37 C)  Musician and Audible Yes  Blood Lines Intact and Secured Yes  Pre Treatment Patient Checks  Vascular access used during treatment Catheter  HD catheter dressing before treatment WDL  Hepatitis B Surface Antigen Results Negative  Date Hepatitis B Surface Antigen Drawn 11/24/18  Hepatitis B Surface Antibody  (<10)  Date Hepatitis B Surface Antibody Drawn 11/24/18  Hemodialysis Consent Verified Yes  Hemodialysis Standing Orders Initiated Yes  ECG (Telemetry) Monitor On Yes  Prime Ordered Normal Saline  Length of  DialysisTreatment -hour(s) 3.5 Hour(s)  Dialysis Treatment Comments Na 140   Dialyzer Elisio 17H NR  Dialysate 2K;2.5 Ca  Dialysate Flow Ordered 800  Blood Flow Rate Ordered 400 mL/min  Ultrafiltration Goal 3.5 Liters   Dialysis Blood Pressure Support Ordered Normal Saline  During Hemodialysis Assessment  Blood Flow Rate (mL/min) 400 mL/min  Arterial Pressure (mmHg) -190 mmHg  Venous Pressure (mmHg) 180 mmHg  Transmembrane Pressure (mmHg) 50 mmHg  Ultrafiltration Rate (mL/min) 1140 mL/min (1122mL per HOUR)  Dialysate Flow Rate (mL/min) 800 ml/min  Conductivity: Machine  14  HD Safety Checks Performed Yes  Dialysis Fluid Bolus Normal Saline  Bolus Amount (mL) 250 mL  Intra-Hemodialysis Comments Tx initiated  Education / Care Plan  Dialysis Education Provided Yes  Documented Education in Care Plan Yes  Hemodialysis Catheter Right Subclavian Double lumen Permanent (Tunneled)  Placement Date: (c) 11/24/18   Orientation: Right  Access Location: Subclavian  Hemodialysis Catheter Type: Double lumen Permanent (Tunneled)  Site Condition No complications  Blue Lumen Status Infusing  Red Lumen Status Infusing  Purple Lumen Status N/A  Dressing Type Biopatch;Occlusive  Dressing Status Clean;Dry;Intact

## 2018-11-27 LAB — CULTURE, BLOOD (ROUTINE X 2)
Culture: NO GROWTH
Culture: NO GROWTH
Special Requests: ADEQUATE

## 2018-11-27 LAB — GLUCOSE, CAPILLARY
Glucose-Capillary: 157 mg/dL — ABNORMAL HIGH (ref 70–99)
Glucose-Capillary: 125 mg/dL — ABNORMAL HIGH (ref 70–99)
Glucose-Capillary: 202 mg/dL — ABNORMAL HIGH (ref 70–99)
Glucose-Capillary: 260 mg/dL — ABNORMAL HIGH (ref 70–99)

## 2018-11-27 LAB — AEROBIC/ANAEROBIC CULTURE W GRAM STAIN (SURGICAL/DEEP WOUND)

## 2018-11-27 NOTE — Progress Notes (Signed)
[place] [facility] [place] NAME: Paul [name]    MR#:  [id number]  DATE OF BIRTH:  10/08/1965  SUBJECTIVE:   No complaints today.  REVIEW OF SYSTEMS:  CONSTITUTIONAL: No fever, fatigue or weakness.  EYES: No blurred or double vision.  EARS, NOSE, AND THROAT: No tinnitus or ear pain.  RESPIRATORY: No cough, shortness of breath, wheezing or hemoptysis.  CARDIOVASCULAR: No chest pain, orthopnea, edema.  GASTROINTESTINAL: No nausea, vomiting, diarrhea or abdominal pain.  GENITOURINARY: No dysuria, hematuria.  ENDOCRINE: No polyuria, nocturia,  HEMATOLOGY: No anemia, easy bruising or bleeding SKIN: No rash or lesion. MUSCULOSKELETAL: Right foot pain, bilateral hand deformities.  NEUROLOGIC: No tingling, numbness, weakness.  PSYCHIATRY: No anxiety or depression.   DRUG ALLERGIES:   Allergies  Allergen Reactions  . Other Other (See Comments)    Cardiac Problems. Pt states he tolerates Toradol. Due to kidney and heart problems per pt  . Ibuprofen Other (See Comments)    Heart problems  . Baclofen Other (See Comments)  . Metformin Diarrhea  . Nsaids     Due to kidney and heart problems per pt    VITALS:  Blood pressure (!) 145/89, pulse 81, temperature 98 F (36.7 C), temperature source Oral, resp. rate 18, height 5\' 10"  (1.778 m), weight 92.5 kg, SpO2 100 %.  PHYSICAL EXAMINATION:  GENERAL:  53 y.o.-year-old patient sitting up on edge of bed with no acute distress.  EYES: Pupils equal, round, reactive to light and accommodation. No scleral icterus. Extraocular muscles intact.  HEENT: Head atraumatic, normocephalic. Oropharynx and nasopharynx clear.  NECK:  Supple, no jugular venous distention. No thyroid enlargement, no tenderness.  LUNGS: Normal breath sounds bilaterally, no wheezing, rales,rhonchi or crepitation. No use of accessory muscles of respiration.  CARDIOVASCULAR: S1, S2 normal. No murmurs, rubs, or gallops.  ABDOMEN: Soft,  nontender, nondistended. Bowel sounds present. No organomegaly or mass.  EXTREMITIES: No pedal edema, cyanosis, or clubbing. 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  Bilateral hand numbness. NEUROLOGIC: Cranial nerves II through XII are intact. Muscle strength 5/5 in all extremities. Sensation intact. Gait not checked.  PSYCHIATRIC: The patient is alert and oriented x 3.  SKIN: +wound vac and dry dressing place over right foot.,  Right foot wrapped in dressing  LABORATORY PANEL:   CBC Recent Labs  Lab 11/26/18 0502  WBC 12.7*  HGB 9.5*  HCT 31.0*  PLT 346   ------------------------------------------------------------------------------------------------------------------  Chemistries  Recent Labs  Lab 11/21/18 2114  11/26/18 0502  NA 135   < > 137  K 5.6*   < > 4.3  CL 100   < > 99  CO2 20*   < > 26  GLUCOSE 217*   < > 185*  BUN 53*   < > 32*  CREATININE 9.07*   < > 6.12*  CALCIUM 8.8*   < > 8.3*  AST 18  --   --   ALT 14  --   --   ALKPHOS 108  --   --   BILITOT 0.6  --   --    < > = values in this interval not displayed.   ------------------------------------------------------------------------------------------------------------------  Cardiac Enzymes No results for input(s): TROPONINI in the last 168 hours. ------------------------------------------------------------------------------------------------------------------  RADIOLOGY:  No results found.  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 -Dressing changes done by podiatry yesterday, overall the wound looks significantly better and hopefully stabilizing, continue wound VAC, wound cultures are showing Pseudomonas, MRSA.  MRSA sensitive to Zyvox, Pseudomonas sensitive to IV antibiotics, appreciate ID evaluation tomorrow for a duration of antibiotics.   Discussed with patient. -Patient has refused BKA -NWB to right foot  ESRD on HD TTS- stable, as per him after last discharge patient still had to ambulate to go for the hemodialysis which aggravated the leg infection. -Nephrology following  Paroxysmal atrial fibrillation-patient has been in normal sinus rhythm here. -Continue aspirin, Plavix, Coreg  Chronic systolic congestive heart failure- stable, no signs of volume overload -Management per nephrology  Gout of the bilateral hands- improving -Continue prednisone and allopurinol   Type 2 diabetes mellitus -Continue SSI  Chronic diarrhea -Continue Bentyl and Imodium  Anemia of chronic kidney disease- hemoglobin is at baseline -EPO with dialysis Cervical degenerative disc disease with bilateral hand numbness, patient takes prednisone, he is told me that he is due for neck surgery.   PT recommended discharge to SNF, but patient is out of bed days, so he will need to be discharged home with maximum home health services. Wheelchair has already been ordered.  All the records are reviewed and case discussed with Care Management/Social Workerr. Management plans discussed with the patient, family and they are in agreement.  CODE STATUS: Full.  TOTAL TIME TAKING CARE OF THIS PATIENT: 33 minutes.    POSSIBLE D/C IN 1-2 DAYS, DEPENDING ON CLINICAL CONDITION.   Epifanio Lesches M.D on 11/27/2018   Between 7am to 6pm - Pager - [phone]  After 6pm go to [email or web address] - password [email or web address] [place] [facility]  Office  [phone]  CC: Primary care physician; Tracie Harrier, MD  Note: This dictation was prepared with Dragon dictation along with smaller phrase technology. Any transcriptional errors that result from this process are unintentional.

## 2018-11-27 NOTE — Progress Notes (Signed)
[facility]  ROUNDING NOTE   Subjective:  Patient completed dialysis yesterday. Ultrafiltration achieved was 3.5 kg. Still having some pain in his right heel.   Objective:  Vital signs in last 24 hours:  Temp:  [97.7 F (36.5 C)-98.4 F (36.9 C)] 98 F (36.7 C) (07/26 0749) Pulse Rate:  [79-99] 81 (07/26 0749) Resp:  [11-19] 18 (07/25 2242) BP: (109-153)/(66-102) 145/89 (07/26 0749) SpO2:  [96 %-100 %] 100 % (07/26 0749) Weight:  [92.5 kg] 92.5 kg (07/25 1630)  Weight change:  Filed Weights   11/22/18 0915 11/22/18 1233 11/26/18 1630  Weight: 95 kg 92.5 kg 92.5 kg    Intake/Output: I/O last 3 completed shifts: In: 389.1 [P.O.:240; IV Piggyback:149.1] Out: 3500 [Other:3500]   Intake/Output this shift:  No intake/output data recorded.  Physical Exam: General: No acute distress  Head: Normocephalic, atraumatic. Moist oral mucosal membranes  Eyes: Anicteric  Neck: Supple, trachea midline  Lungs:  Clear to auscultation, normal effort  Heart: S1S2 no rubs  Abdomen:  Soft, nontender, bowel sounds present  Extremities: 1+ LE edema, right foot wrapped, wound vac in place  Neurologic: Awake, alert, following commands  Skin: No lesions  Access: IJ PermCath.    Basic Metabolic Panel: Recent Labs  Lab 11/21/18 2114 11/22/18 0926 11/23/18 0338 11/24/18 1011 11/25/18 0400 11/26/18 0502 11/26/18 1600  NA 135  --  139  --  139 137  --   K 5.6*  --  4.0 4.3 4.0 4.3  --   CL 100  --  102  --  99 99  --   CO2 20*  --  26  --  30 26  --   GLUCOSE 217*  --  104*  --  184* 185*  --   BUN 53*  --  33*  --  18 32*  --   CREATININE 9.07*  --  5.79*  --  4.41* 6.12*  --   CALCIUM 8.8*  --  8.5*  --  8.2* 8.3*  --   PHOS  --  5.5*  --   --  4.0 5.1* 5.3*    Liver Function Tests: Recent Labs  Lab 11/21/18 2114 11/25/18 0400 11/26/18 0502  AST 18  --   --   ALT 14  --   --   ALKPHOS 108  --   --   BILITOT 0.6  --   --   PROT 7.9  --   --   ALBUMIN 3.5  2.8* 2.8*   No results for input(s): LIPASE, AMYLASE in the last 168 hours. No results for input(s): AMMONIA in the last 168 hours.  CBC: Recent Labs  Lab 11/21/18 2114 11/23/18 0338 11/25/18 0400 11/26/18 0502  WBC 11.7* 10.0 10.7* 12.7*  HGB 9.5* 9.1* 9.2* 9.5*  HCT 31.6* 30.8* 30.7* 31.0*  MCV 91.3 90.9 90.6 89.6  PLT 269 297 302 346    Cardiac Enzymes: No results for input(s): CKTOTAL, CKMB, CKMBINDEX, TROPONINI in the last 168 hours.  BNP: Invalid input(s): POCBNP  CBG: Recent Labs  Lab 11/26/18 0803 11/26/18 1140 11/26/18 2125 11/27/18 0750 11/27/18 1137  GLUCAP 169* 157* 145* 157* 125*    Microbiology: Results for orders placed or performed during the hospital encounter of 11/21/18  SARS Coronavirus 2 (CEPHEID - Performed in [facility] hospital lab), Hosp Order     Status: None   Collection Time: 11/21/18  6:42 PM   Specimen: Nasopharyngeal Swab  Result Value Ref Range Status  SARS Coronavirus 2 NEGATIVE NEGATIVE Final    Comment: (NOTE) If result is NEGATIVE SARS-CoV-2 target nucleic acids are NOT DETECTED. The SARS-CoV-2 RNA is generally detectable in upper and lower  respiratory specimens during the acute phase of infection. The lowest  concentration of SARS-CoV-2 viral copies this assay can detect is 250  copies / mL. A negative result does not preclude SARS-CoV-2 infection  and should not be used as the sole basis for treatment or other  patient management decisions.  A negative result may occur with  improper specimen collection / handling, submission of specimen other  than nasopharyngeal swab, presence of viral mutation(s) within the  areas targeted by this assay, and inadequate number of viral copies  (<250 copies / mL). A negative result must be combined with clinical  observations, patient history, and epidemiological information. If result is POSITIVE SARS-CoV-2 target nucleic acids are DETECTED. The SARS-CoV-2 RNA is generally  detectable in upper and lower  respiratory specimens dur ing the acute phase of infection.  Positive  results are indicative of active infection with SARS-CoV-2.  Clinical  correlation with patient history and other diagnostic information is  necessary to determine patient infection status.  Positive results do  not rule out bacterial infection or co-infection with other viruses. If result is PRESUMPTIVE POSTIVE SARS-CoV-2 nucleic acids MAY BE PRESENT.   A presumptive positive result was obtained on the submitted specimen  and confirmed on repeat testing.  While 2019 novel coronavirus  (SARS-CoV-2) nucleic acids may be present in the submitted sample  additional confirmatory testing may be necessary for epidemiological  and / or clinical management purposes  to differentiate between  SARS-CoV-2 and other Sarbecovirus currently known to infect humans.  If clinically indicated additional testing with an alternate test  methodology [phone]) is advised. The SARS-CoV-2 RNA is generally  detectable in upper and lower respiratory sp ecimens during the acute  phase of infection. The expected result is Negative. Fact Sheet for Patients:  StrictlyIdeas.no Fact Sheet for Healthcare Providers: BankingDealers.co.za This test is not yet approved or cleared by the [place] FDA and has been authorized for detection and/or diagnosis of SARS-CoV-2 by FDA under an Emergency Use Authorization (EUA).  This EUA will remain in effect (meaning this test can be used) for the duration of the COVID-19 declaration under Section 564(b)(1) of the Act, 21 U.S.C. section 360bbb-3(b)(1), unless the authorization is terminated or revoked sooner. Performed at [facility], [address] [place], [place] [address]   MRSA PCR Screening     Status: Abnormal   Collection Time: 11/22/18  9:38 AM   Specimen: Nasal Mucosa; Nasopharyngeal  Result Value Ref  Range Status   MRSA by PCR POSITIVE (A) NEGATIVE Final    Comment:        The GeneXpert MRSA Assay (FDA approved for NASAL specimens only), is one component of a comprehensive MRSA colonization surveillance program. It is not intended to diagnose MRSA infection nor to guide or monitor treatment for MRSA infections. RESULT CALLED TO, READ BACK BY AND VERIFIED WITH: JASMINE MCLENDON AT 1526 ON 11/22/2018. TIK Performed at [facility], [address] [place], [place] [address]   Aerobic/Anaerobic Culture (surgical/deep wound)     Status: None   Collection Time: 11/22/18  1:50 PM   Specimen: Wound  Result Value Ref Range Status   Specimen Description   Final    WOUND Performed at [facility], [address], [place], [place] [address]  Special Requests   Final    Immunocompromised,RT HEEL Performed at [facility], [address] [place], [place] [address]    Gram Stain   Final    FEW WBC PRESENT, PREDOMINANTLY PMN FEW GRAM POSITIVE COCCI    Culture   Final    FEW PSEUDOMONAS AERUGINOSA MODERATE METHICILLIN RESISTANT STAPHYLOCOCCUS AUREUS NO ANAEROBES ISOLATED Performed at [facility], [place] [address], [place], [place] [address]    Report Status 11/27/2018 FINAL  Final   Organism ID, Bacteria PSEUDOMONAS AERUGINOSA  Final   Organism ID, Bacteria METHICILLIN RESISTANT STAPHYLOCOCCUS AUREUS  Final      Susceptibility   Methicillin resistant staphylococcus aureus - MIC*    CIPROFLOXACIN >=8 RESISTANT Resistant     ERYTHROMYCIN >=8 RESISTANT Resistant     GENTAMICIN <=0.5 SENSITIVE Sensitive     OXACILLIN >=4 RESISTANT Resistant     TETRACYCLINE <=1 SENSITIVE Sensitive     VANCOMYCIN 1 SENSITIVE Sensitive     TRIMETH/SULFA >=320 RESISTANT Resistant     CLINDAMYCIN <=0.25 SENSITIVE Sensitive     RIFAMPIN <=0.5 SENSITIVE Sensitive     Inducible Clindamycin NEGATIVE Sensitive     * MODERATE METHICILLIN RESISTANT STAPHYLOCOCCUS AUREUS    Pseudomonas aeruginosa - MIC*    CEFTAZIDIME 4 SENSITIVE Sensitive     CIPROFLOXACIN >=4 RESISTANT Resistant     GENTAMICIN <=1 SENSITIVE Sensitive     IMIPENEM 2 SENSITIVE Sensitive     PIP/TAZO 16 SENSITIVE Sensitive     CEFEPIME 8 SENSITIVE Sensitive     * FEW PSEUDOMONAS AERUGINOSA  CULTURE, BLOOD (ROUTINE X 2) w Reflex to ID Panel     Status: None   Collection Time: 11/22/18  7:52 PM   Specimen: BLOOD  Result Value Ref Range Status   Specimen Description BLOOD LEFT ANTECUBITAL  Final   Special Requests   Final    BOTTLES DRAWN AEROBIC AND ANAEROBIC Blood Culture adequate volume   Culture   Final    NO GROWTH 5 DAYS Performed at [facility], [address] [place], [place] [address]    Report Status 11/27/2018 FINAL  Final  CULTURE, BLOOD (ROUTINE X 2) w Reflex to ID Panel     Status: None   Collection Time: 11/22/18  7:52 PM   Specimen: BLOOD  Result Value Ref Range Status   Specimen Description BLOOD BLOOD RIGHT ARM  Final   Special Requests   Final    BOTTLES DRAWN AEROBIC ONLY Blood Culture results may not be optimal due to an inadequate volume of blood received in culture bottles   Culture   Final    NO GROWTH 5 DAYS Performed at [facility], [address], [place], [place] [address]    Report Status 11/27/2018 FINAL  Final  Aerobic/Anaerobic Culture (surgical/deep wound)     Status: None (Preliminary result)   Collection Time: 11/24/18 12:21 PM   Specimen: [facility] Bone biopsy; Tissue  Result Value Ref Range Status   Specimen Description   Final    HEEL Performed at [facility], [address], [place], [place] [address]    Special Requests   Final    BONE CULTURE RIGHT HEEL Performed at [facility], [address] [place], [place] [address]    Gram Stain   Final    NO WBC SEEN FEW GRAM POSITIVE COCCI Performed at [facility], [place] [address] [place], [place] [address]    Culture FEW METHICILLIN RESISTANT  STAPHYLOCOCCUS AUREUS  Final   Report Status PENDING  Incomplete   Organism ID, Bacteria METHICILLIN RESISTANT STAPHYLOCOCCUS AUREUS  Final      Susceptibility   Methicillin resistant staphylococcus aureus - MIC*    CIPROFLOXACIN >=8 RESISTANT Resistant     ERYTHROMYCIN >=8 RESISTANT Resistant     GENTAMICIN <=0.5 SENSITIVE Sensitive     OXACILLIN >=4 RESISTANT Resistant     TETRACYCLINE <=1 SENSITIVE Sensitive     VANCOMYCIN 1 SENSITIVE Sensitive     TRIMETH/SULFA >=320 RESISTANT Resistant     CLINDAMYCIN <=0.25 SENSITIVE Sensitive     RIFAMPIN <=0.5 SENSITIVE Sensitive     Inducible Clindamycin NEGATIVE Sensitive     * FEW METHICILLIN RESISTANT STAPHYLOCOCCUS AUREUS    Coagulation Studies: No results for input(s): LABPROT, INR in the last 72 hours.  Urinalysis: No results for input(s): COLORURINE, LABSPEC, PHURINE, GLUCOSEU, HGBUR, BILIRUBINUR, KETONESUR, PROTEINUR, UROBILINOGEN, NITRITE, LEUKOCYTESUR in the last 72 hours.  Invalid input(s): APPERANCEUR    Imaging: No results found.   Medications:   . piperacillin-tazobactam (ZOSYN)  IV 3.375 g (11/27/18 0849)   . allopurinol  100 mg Oral Daily  . amiodarone  100 mg Oral Daily  . aspirin EC  81 mg Oral Daily  . carvedilol  3.125 mg Oral BID  . Chlorhexidine Gluconate Cloth  6 each Topical Q0600  . Chlorhexidine Gluconate Cloth  6 each Topical Q0600  . clopidogrel  75 mg Oral Q breakfast  . dicyclomine  10 mg Oral TID AC & HS  . epoetin (EPOGEN/PROCRIT) injection  4,000 Units Intravenous Q T,Th,Sa-HD  . gabapentin  100 mg Oral TID  . insulin aspart  0-5 Units Subcutaneous QHS  . insulin aspart  0-9 Units Subcutaneous TID WC  . linezolid  600 mg Oral Q12H  . mupirocin ointment  1 application Nasal BID  . predniSONE  10 mg Oral Q breakfast   HYDROcodone-acetaminophen, loperamide  Assessment/ Plan:  53 y.o. male  with type 2 diabetes, diabetic neuropathy, hypertension, coronary disease, congestive heart failure  with LVEF of 25 to 30% in January 8099, grade 1 diastolic dysfunction, ICD, history of CABG in 2010, carotid stenosis, stroke, obstructive sleep apnea, peripheral vascular disease, gout, osteomyelitis of heel  [facility]/N. /TTHS 2nd  1.  ESRD on HD TTS.  Patient completed dialysis yesterday.  Ultrafiltration achieved was 3.5 kg.  Next dialysis on Tuesday if still here.  2.  Hyperkalemia.  Resolved with dialysis.  Most recent potassium was 4.3.  3.  Secondary hyperparathyroidism.  Phosphorus 5.3 and at the moment and within target range.  Not on binders at the moment.  4.  Hypertension.  Continue carvedilol 3.125 mg p.o. twice daily.  Consider losartan if blood pressure remains high.  5.  Anemia of chronic kidney disease.  Hemoglobin 9.5.  Maintain the patient on Epogen 4000 units with dialysis.  LOS: 6 [place] 7/26/20202:43 PM

## 2018-11-27 NOTE — Plan of Care (Signed)

## 2018-11-28 DIAGNOSIS — I132 Hypertensive heart and chronic kidney disease with heart failure and with stage 5 chronic kidney disease, or end stage renal disease: Secondary | ICD-10-CM

## 2018-11-28 DIAGNOSIS — I739 Peripheral vascular disease, unspecified: Secondary | ICD-10-CM

## 2018-11-28 DIAGNOSIS — I509 Heart failure, unspecified: Secondary | ICD-10-CM

## 2018-11-28 DIAGNOSIS — N186 End stage renal disease: Secondary | ICD-10-CM

## 2018-11-28 LAB — CBC
HCT: 32 % — ABNORMAL LOW (ref 39.0–52.0)
HCT: 35.3 % — ABNORMAL LOW (ref 39.0–52.0)
Hemoglobin: 9.6 g/dL — ABNORMAL LOW (ref 13.0–17.0)
Hemoglobin: 10.5 g/dL — ABNORMAL LOW (ref 13.0–17.0)
MCH: 27 pg (ref 26.0–34.0)
MCH: 27.3 pg (ref 26.0–34.0)
MCHC: 30 g/dL (ref 30.0–36.0)
MCHC: 29.7 g/dL — ABNORMAL LOW (ref 30.0–36.0)
MCV: 89.9 fL (ref 80.0–100.0)
MCV: 91.7 fL (ref 80.0–100.0)
Platelets: 422 10*3/uL — ABNORMAL HIGH (ref 150–400)
Platelets: 450 10*3/uL — ABNORMAL HIGH (ref 150–400)
RBC: 3.56 MIL/uL — ABNORMAL LOW (ref 4.22–5.81)
RBC: 3.85 MIL/uL — ABNORMAL LOW (ref 4.22–5.81)
RDW: 16.7 % — ABNORMAL HIGH (ref 11.5–15.5)
RDW: 16.9 % — ABNORMAL HIGH (ref 11.5–15.5)
WBC: 10.8 10*3/uL — ABNORMAL HIGH (ref 4.0–10.5)
WBC: 11.3 10*3/uL — ABNORMAL HIGH (ref 4.0–10.5)
nRBC: 0 % (ref 0.0–0.2)
nRBC: 0 % (ref 0.0–0.2)

## 2018-11-28 LAB — RENAL FUNCTION PANEL
Albumin: 3.1 g/dL — ABNORMAL LOW (ref 3.5–5.0)
Anion gap: 14 (ref 5–15)
BUN: 48 mg/dL — ABNORMAL HIGH (ref 6–20)
CO2: 23 mmol/L (ref 22–32)
Calcium: 8.5 mg/dL — ABNORMAL LOW (ref 8.9–10.3)
Chloride: 97 mmol/L — ABNORMAL LOW (ref 98–111)
Creatinine, Ser: 6.95 mg/dL — ABNORMAL HIGH (ref 0.61–1.24)
GFR calc Af Amer: 10 mL/min — ABNORMAL LOW (ref >60)
GFR calc non Af Amer: 8 mL/min — ABNORMAL LOW (ref >60)
Glucose, Bld: 298 mg/dL — ABNORMAL HIGH (ref 70–99)
Phosphorus: 5.9 mg/dL — ABNORMAL HIGH (ref 2.5–4.6)
Potassium: 5.3 mmol/L — ABNORMAL HIGH (ref 3.5–5.1)
Sodium: 134 mmol/L — ABNORMAL LOW (ref 135–145)

## 2018-11-28 LAB — SURGICAL PATHOLOGY

## 2018-11-28 LAB — GLUCOSE, CAPILLARY
Glucose-Capillary: 139 mg/dL — ABNORMAL HIGH (ref 70–99)
Glucose-Capillary: 165 mg/dL — ABNORMAL HIGH (ref 70–99)
Glucose-Capillary: 218 mg/dL — ABNORMAL HIGH (ref 70–99)
Glucose-Capillary: 298 mg/dL — ABNORMAL HIGH (ref 70–99)

## 2018-11-28 MED ORDER — LIDOCAINE-PRILOCAINE 2.5-2.5 % EX CREA
1.0000 "application " | TOPICAL_CREAM | CUTANEOUS | Status: DC | PRN
  Filled 2018-11-28: qty 5

## 2018-11-28 MED ORDER — HEPARIN SODIUM (PORCINE) 1000 UNIT/ML DIALYSIS
1000.0000 [IU] | INTRAMUSCULAR | Status: DC | PRN
  Filled 2018-11-28: qty 1

## 2018-11-28 MED ORDER — SODIUM CHLORIDE 0.9 % IV SOLN: 100.0000 mL | INTRAVENOUS | Status: DC | PRN

## 2018-11-28 MED ORDER — LIDOCAINE HCL (PF) 1 % IJ SOLN
5.0000 mL | INTRAMUSCULAR | Status: DC | PRN
  Filled 2018-11-28: qty 5

## 2018-11-28 MED ORDER — ALTEPLASE 2 MG IJ SOLR
2.0000 mg | Freq: Once | INTRAMUSCULAR | Status: DC | PRN
  Filled 2018-11-28: qty 2

## 2018-11-28 MED ORDER — PENTAFLUOROPROP-TETRAFLUOROETH EX AERO
1.0000 "application " | INHALATION_SPRAY | CUTANEOUS | Status: DC | PRN
  Filled 2018-11-28: qty 30

## 2018-11-28 NOTE — TOC Progression Note (Signed)
Transition of Care (TOC) - Progression Note    Patient Details  Name: Paul [name]. MRN: [id number] Date of Birth: 08-14-1965  Transition of Care [facility]) CM/SW Contact  [name], RN Phone Number: 11/28/2018, 9:43 AM  Clinical Narrative:     Spoke to the Financial office about helping the patient apply for Medicaid, they stated that due to the patient having Medicare he would need to apply for Medicaid on his own,  He should be able to apply online I looked up the [facility] Medicaid application on line and provided the patient with the web address to apply for Medicaid with ePASS. He stated that he would like to see a physical therapist today.  I notified Raquel Sarna with PT and requested that someone see him today  Expected Discharge Plan: [place] Barriers to Discharge: Continued Medical Work up  Expected Discharge Plan and Services Expected Discharge Plan: [place]   Discharge Planning Services: CM Consult   Living arrangements for the past 2 months: [place]                 DME Arranged: N/A                     Social Determinants of Health (SDOH) Interventions    Readmission Risk Interventions Readmission Risk Prevention Plan 11/25/2018 10/06/2018 09/09/2018  Transportation Screening Complete Complete Complete  PCP or Specialist Appt within 3-5 Days Complete Complete Complete  HRI or Home Care Consult Complete Complete Complete  Social Work Consult for [place] Planning/Counseling Complete Complete Not Complete  SW consult not completed comments - - NA  Palliative Care Screening Not Applicable Not Applicable Not Applicable  Medication Review (RN Care Manager) Complete - -  Some recent data might be hidden

## 2018-11-28 NOTE — Progress Notes (Signed)
[place] [facility] [place] NAME: Paul [name]    MR#:  [id number]  DATE OF BIRTH:  January 25, 1966  SUBJECTIVE:   Pain is well controlled.  Right foot dressing changed by podiatry in the morning.  Wound VAC present REVIEW OF SYSTEMS:  CONSTITUTIONAL: No fever, fatigue or weakness.  EYES: No blurred or double vision.  EARS, NOSE, AND THROAT: No tinnitus or ear pain.  RESPIRATORY: No cough, shortness of breath, wheezing or hemoptysis.  CARDIOVASCULAR: No chest pain, orthopnea, edema.  GASTROINTESTINAL: No nausea, vomiting, diarrhea or abdominal pain.  GENITOURINARY: No dysuria, hematuria.  ENDOCRINE: No polyuria, nocturia,  HEMATOLOGY: No anemia, easy bruising or bleeding SKIN: No rash or lesion. MUSCULOSKELETAL: Right foot pain, bilateral hand deformities.   NEUROLOGIC: No tingling, numbness, weakness.  PSYCHIATRY: No anxiety or depression.   DRUG ALLERGIES:   Allergies  Allergen Reactions  . Other Other (See Comments)    Cardiac Problems. Pt states he tolerates Toradol. Due to kidney and heart problems per pt  . Ibuprofen Other (See Comments)    Heart problems  . Baclofen Other (See Comments)  . Metformin Diarrhea  . Nsaids     Due to kidney and heart problems per pt    VITALS:  Blood pressure 122/80, pulse 81, temperature 97.7 F (36.5 C), temperature source Oral, resp. rate 17, height 5\' 10"  (1.778 m), weight 92.5 kg, SpO2 100 %.  PHYSICAL EXAMINATION:  GENERAL:  53 y.o.-year-old patient sitting up on edge of bed with no acute distress.  EYES: Pupils equal, round, reactive to light and accommodation. No scleral icterus. Extraocular muscles intact.  HEENT: Head atraumatic, normocephalic. Oropharynx and nasopharynx clear.  NECK:  Supple, no jugular venous distention. No thyroid enlargement, no tenderness.  LUNGS: Normal breath sounds bilaterally, no wheezing, rales,rhonchi or crepitation. No use of accessory muscles of respiration.   CARDIOVASCULAR: S1, S2 normal. No murmurs, rubs, or gallops.  ABDOMEN: Soft, nontender, nondistended. Bowel sounds present. No organomegaly or mass.  EXTREMITIES: No pedal edema, cyanosis, or clubbing. 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  Bilateral hand numbness. NEUROLOGIC: Cranial nerves II through XII are intact. Muscle strength 5/5 in all extremities. Sensation intact. Gait not checked.  PSYCHIATRIC: The patient is alert and oriented x 3.  SKIN: +wound vac and dry dressing place over right foot.,  Right foot wrapped in dressing  LABORATORY PANEL:   CBC Recent Labs  Lab 11/28/18 0549  WBC 10.8*  HGB 9.6*  HCT 32.0*  PLT 422*   ------------------------------------------------------------------------------------------------------------------  Chemistries  Recent Labs  Lab 11/21/18 2114  11/26/18 0502  NA 135   < > 137  K 5.6*   < > 4.3  CL 100   < > 99  CO2 20*   < > 26  GLUCOSE 217*   < > 185*  BUN 53*   < > 32*  CREATININE 9.07*   < > 6.12*  CALCIUM 8.8*   < > 8.3*  AST 18  --   --   ALT 14  --   --   ALKPHOS 108  --   --   BILITOT 0.6  --   --    < > = values in this interval not displayed.   ------------------------------------------------------------------------------------------------------------------  Cardiac Enzymes No results for input(s): TROPONINI in the last 168 hours. ------------------------------------------------------------------------------------------------------------------  RADIOLOGY:  No results found.  ASSESSMENT AND PLAN:   Active Problems:   Osteomyelitis ([place])  Necrotic right foot wound with  underlying osteomyelitis of the calcaneus -s/p I&D and wound vac placement on 7/24, continue wound VAC. -Dressing changes done by podiatry today.  Wound VAC continued, Wound and bone cultures showing Pseudomonas and MRSA. Discussed with infectious disease for antibiotic regimen changes -Patient has refused BKA -NWB to right foot  ESRD  on HD TTS- stable -Nephrology following  Paroxysmal atrial fibrillation-patient has been in normal sinus rhythm here. -Continue aspirin, Plavix, Coreg  Chronic systolic congestive heart failure- stable, no signs of volume overload -Management per nephrology  Gout of the bilateral hands- improving -Continue prednisone and allopurinol   Type 2 diabetes mellitus -Continue SSI  Chronic diarrhea -Continue Bentyl and Imodium  Anemia of chronic kidney disease- hemoglobin is at baseline -EPO with dialysis  Cervical degenerative disc disease with bilateral hand numbness, patient takes prednisone, he is told me that he is due for neck surgery.   Skilled nursing facility would be ideal but unfortunately patient is unable to go there due to running out of his rehab days.  Home with home health is an option.  Discussed with case Freight forwarder. We will get OT evaluation Wheelchair being set up.  All the records are reviewed and case discussed with Care Management/Social Worker Management plans discussed with the patient, family and they are in agreement.  CODE STATUS: Full.  TOTAL TIME TAKING CARE OF THIS PATIENT: 35 minutes.   POSSIBLE D/C IN 2-3 DAYS, DEPENDING ON CLINICAL CONDITION.  Neita Carp M.D on 11/28/2018   Between 7am to 6pm - Pager - [phone]  After 6pm go to [email or web address] - password [email or web address] [facility]  Office  [phone]  CC: Primary care physician; Tracie Harrier, MD  Note: This dictation was prepared with Dragon dictation along with smaller phrase technology. Any transcriptional errors that result from this process are unintentional.

## 2018-11-28 NOTE — Consult Note (Signed)
[facility] Vascular Consult Note  MRN : [id number]  Paul Opiela. is a 53 y.o. (Mar 02, 1966) male who presents with chief complaint of "right foot wound".  History of Present Illness:  The patient is a 53 year old male with a past medical history of hypertension, diabetes, coronary artery disease, congestive heart failure, peripheral artery disease, end-stage renal disease on hemodialysis with right lower extremity foot wound/infection.  Patient endorses a history of his right foot surgical site healing however states once he "started to go to dialysis the wound opened up".  Patient notes the wound to the left foot has healed.  Patient with minimal discomfort to the right foot.  Denies any issues with his PermCath at this time.  Denies any fever, nausea vomiting.  Denies any shortness of breath or chest pain.  Denies any right lower extremity wrist pain.  Recent Vascular Interventions; 10/07/18: Exchangeright IJ Hickman catheter for a right IJtunneled dialysis catheter over wire same access  09/09/18:  1. Introduction catheter into right lower extremity 3rd order catheter placement  2. Contrast injection right lower extremity for distal runoff   3. Percutaneous transluminal angioplasty right popliteal artery  4. Star close closure left common femoral arteriotomy  07/27/18: 1. Ultrasound guidance for vascular access left femoral artery 2. Catheter placement into right common femoral artery from left femoral approach 3. Aortogram and selective right lower extremity angiogram 4. Percutaneous transluminal angioplasty of right distal SFA and above knee popliteal artery with a 5 mm diameter by 15 cm length Lutonix drug-coated angioplasty balloon 5.  Percutaneous transluminal angioplasty of the right tibioperoneal trunk and peroneal artery with 3 mm diameter  by 22 cm length angioplasty balloon             6.  StarClose closure device left femoral artery  On 11/24/18, the patient recently underwent a incision and drainage of infection and abscess to the right plantar heel with excision of large necrotic ulcer involving skin subcutaneous tissue and fat and also removal of prominent bone and culture of said bone from the plantar aspect of the right calcaneus by Dr. Elvina Mattes.  Patient has been receiving IV antibiotics for osteomyelitis and undergoing dressing/VAC changes by podiatry.  Vascular surgery was consulted by Dr. Elvina Mattes for continued surveillance of the patient's peripheral artery disease.  Current Facility-Administered Medications  Medication Dose Route Frequency Provider Last Rate Last Dose  . allopurinol (ZYLOPRIM) tablet 100 mg  100 mg Oral Daily Loletha Grayer, MD   100 mg at 11/28/18 1037  . amiodarone (PACERONE) tablet 100 mg  100 mg Oral Daily Vaughan Basta, MD   100 mg at 11/28/18 1034  . aspirin EC tablet 81 mg  81 mg Oral Daily Loletha Grayer, MD   81 mg at 11/28/18 1033  . carvedilol (COREG) tablet 3.125 mg  3.125 mg Oral BID Loletha Grayer, MD   3.125 mg at 11/28/18 1033  . Chlorhexidine Gluconate Cloth 2 % PADS 6 each  6 each Topical Q0600 Vaughan Basta, MD   6 each at 11/28/18 0542  . Chlorhexidine Gluconate Cloth 2 % PADS 6 each  6 each Topical Q0600 Holley Raring, [place], MD   6 each at 11/28/18 1037  . clopidogrel (PLAVIX) tablet 75 mg  75 mg Oral Q breakfast Loletha Grayer, MD   75 mg at 11/28/18 1033  . dicyclomine (BENTYL) capsule 10 mg  10 mg Oral TID AC & HS Loletha Grayer, MD   10 mg at 11/28/18 1252  .  epoetin alfa (EPOGEN) injection 4,000 Units  4,000 Units Intravenous Q T,Th,Sa-HD Epifanio Lesches, MD   4,000 Units at 11/26/18 2021  . gabapentin (NEURONTIN) capsule 100 mg  100 mg Oral TID Loletha Grayer, MD   100 mg at 11/28/18 1033  . HYDROcodone-acetaminophen (NORCO/VICODIN) 5-325 MG per  tablet 1 tablet  1 tablet Oral Q4H PRN Mayo, Pete Pelt, MD   1 tablet at 11/28/18 1100  . insulin aspart (novoLOG) injection 0-5 Units  0-5 Units Subcutaneous QHS Loletha Grayer, MD   3 Units at 11/27/18 2131  . insulin aspart (novoLOG) injection 0-9 Units  0-9 Units Subcutaneous TID WC Loletha Grayer, MD   2 Units at 11/28/18 1251  . linezolid (ZYVOX) tablet 600 mg  600 mg Oral Q12H Tsosie Billing, MD   600 mg at 11/28/18 1035  . loperamide (IMODIUM) capsule 2 mg  2 mg Oral Q8H PRN Wieting, [name], MD      . mupirocin ointment (BACTROBAN) 2 % 1 application  1 application Nasal BID Vaughan Basta, MD   1 application at 16/01/09 1035  . piperacillin-tazobactam (ZOSYN) IVPB 3.375 g  3.375 g Intravenous Q12H Tsosie Billing, MD 12.5 mL/hr at 11/28/18 0541 3.375 g at 11/28/18 0541  . predniSONE (DELTASONE) tablet 10 mg  10 mg Oral Q breakfast Loletha Grayer, MD   10 mg at 11/28/18 1034   Past Medical History:  Diagnosis Date  . Anemia   . Cardiac defibrillator in place    a. Biotronik LUmax 540 DRT, (ser # [id number]).  . Carotid arterial disease ([place])    a. s/p prior LICA stenting;  b. 06/252 Carotid U/S: 40-59% bilat ICA stenosis. Patent LICA stent.  . CHF (congestive heart failure) ([place])   . CKD (chronic kidney disease), stage III ([place])   . Coronary artery disease    a. 2010 s/p CABG x 3.  . DDD (degenerative disc disease), lumbosacral    L5-S1  . Diabetes ([place])    Lantus at bedtime  . Gangrene of toe of right foot ([place])   . Gout of left hand 10/06/2016  . HFrEF (heart failure with reduced ejection fraction) ([place])    a. 07/2016 Echo: EF 25-30%, diff HK, mild MR, mildly dil LA, mod reduced RV fxn, PASP 54mmHg.  Marland Kitchen Hypertension    takes Coreg daily  . Ischemic cardiomyopathy    a. 07/2016 Echo: EF 25-30%, diff HK.  . Myocardial infarction ([place]) 2009  . Peripheral vascular disease ([place])   . Sleep apnea    sleep study yr ago-unable to afford cpap  . Stroke  [facility]) 09   no weakness   Past Surgical History:  Procedure Laterality Date  . AMPUTATION TOE Right 08/22/2016   Procedure: AMPUTATION TOE;  Surgeon: Samara Deist, DPM;  Location: [facility];  Service: Podiatry;  Laterality: Right;  . AMPUTATION TOE Right 10/09/2016   Procedure: AMPUTATION TOE-RIGHT 2ND MPJ;  Surgeon: Samara Deist, DPM;  Location: [facility];  Service: Podiatry;  Laterality: Right;  . APLIGRAFT PLACEMENT Right 10/09/2016   Procedure: APLIGRAFT PLACEMENT;  Surgeon: Samara Deist, DPM;  Location: [facility];  Service: Podiatry;  Laterality: Right;  . CALCANEAL OSTEOTOMY Bilateral 07/28/2018   Procedure: RIGHT CALCANECTOMY BILATERAL DEBRIDEMENT OF ULCERS ON HEELS;  Surgeon: Albertine Patricia, DPM;  Location: [facility];  Service: Podiatry;  Laterality: Bilateral;  . CARDIAC DEFIBRILLATOR PLACEMENT  2011  . CAROTID ENDARTERECTOMY Left   . CHOLECYSTECTOMY  2010  . CORONARY ARTERY BYPASS GRAFT  2010   CABG x  3   . DIALYSIS/PERMA CATHETER INSERTION N/A 10/07/2018   Procedure: DIALYSIS/PERMA CATHETER INSERTION;  Surgeon: Katha Cabal, MD;  Location: [place] [facility];  Service: Cardiovascular;  Laterality: N/A;  . GRAFT APPLICATION Right 0/63/0160   Procedure: FULL THICKNESS SKIN GRAFT-RIGHT FOOT;  Surgeon: Algernon Huxley, MD;  Location: [facility];  Service: Vascular;  Laterality: Right;  . HIP SURGERY Right 1994  . INCISION AND DRAINAGE Right 09/08/2018   Procedure: INCISION AND DRAINAGE - [place] OF DEFECTIVE SKIN, SOFT TISSUE AND BONE;  Surgeon: Albertine Patricia, DPM;  Location: [facility];  Service: Podiatry;  Laterality: Right;  . INCISION AND DRAINAGE OF WOUND Right 08/22/2016   Procedure: IRRIGATION AND DEBRIDEMENT WOUND and wound vac placement;  Surgeon: Samara Deist, DPM;  Location: [facility];  Service: Podiatry;  Laterality: Right;  . IRRIGATION AND DEBRIDEMENT ABSCESS Right 11/24/2018   Procedure: IRRIGATION AND DEBRIDEMENT ABSCESS RIGHT FOOT, COMPLICATED, DIABETIC;  Surgeon:  Albertine Patricia, DPM;  Location: [facility];  Service: Podiatry;  Laterality: Right;  . LOWER EXTREMITY ANGIOGRAPHY Right 08/24/2016   Procedure: Lower Extremity Angiography;  Surgeon: Algernon Huxley, MD;  Location: [place] [facility];  Service: Cardiovascular;  Laterality: Right;  . LOWER EXTREMITY ANGIOGRAPHY Right 07/27/2018   Procedure: RIGHT Lower Extremity Angiography;  Surgeon: Algernon Huxley, MD;  Location: [place] [facility];  Service: Cardiovascular;  Laterality: Right;  . LOWER EXTREMITY ANGIOGRAPHY Right 09/09/2018   Procedure: Lower Extremity Angiography;  Surgeon: Katha Cabal, MD;  Location: [place] [facility];  Service: Cardiovascular;  Laterality: Right;  . MASS EXCISION Right 07/18/2014   Procedure: EXCISION HETEROTOPIC BONE RIGHT HIP;  Surgeon: Frederik Pear, MD;  Location: [place];  Service: Orthopedics;  Laterality: Right;  . Open Heart Surgery  2010   x 3  . WOUND DEBRIDEMENT Right 10/09/2016   Procedure: DEBRIDEMENT WOUND;  Surgeon: Samara Deist, DPM;  Location: [facility];  Service: Podiatry;  Laterality: Right;   Social History Social History   Tobacco Use  . Smoking status: Never Smoker  . Smokeless tobacco: Never Used  Substance Use Topics  . Alcohol use: No  . Drug use: No   Family History Family History  Problem Relation Age of Onset  . Hypertension Other   . Diabetes Other   . Diabetes Father   . Hyperlipidemia Father   . Hypertension Father   . Hyperlipidemia Sister   . Hypertension Sister   . Diabetes Sister   . Diabetes Brother   . Hypertension Brother   . Hyperlipidemia Brother   Patient denies any family history of peripheral artery disease, venous disease or renal disease.  Allergies  Allergen Reactions  . Other Other (See Comments)    Cardiac Problems. Pt states he tolerates Toradol. Due to kidney and heart problems per pt  . Ibuprofen Other (See Comments)    Heart problems  . Baclofen Other (See Comments)  . Metformin Diarrhea  .  Nsaids     Due to kidney and heart problems per pt   REVIEW OF SYSTEMS (Negative unless checked)  Constitutional: [] Weight loss  [] Fever  [] Chills Cardiac: [] Chest pain   [] Chest pressure   [] Palpitations   [] Shortness of breath when laying flat   [] Shortness of breath at rest   [] Shortness of breath with exertion. Vascular:  [] Pain in legs with walking   [] Pain in legs at rest   [] Pain in legs when laying flat   [] Claudication   [x] Pain in feet when walking  [  x]Pain in feet at rest  [x] Pain in feet when laying flat   [] History of DVT   [] Phlebitis   [] Swelling in legs   [] Varicose veins   [] Non-healing ulcers Pulmonary:   [] Uses home oxygen   [] Productive cough   [] Hemoptysis   [] Wheeze  [] COPD   [] Asthma Neurologic:  [] Dizziness  [] Blackouts   [] Seizures   [] History of stroke   [] History of TIA  [] Aphasia   [] Temporary blindness   [] Dysphagia   [] Weakness or numbness in arms   [] Weakness or numbness in legs Musculoskeletal:  [] Arthritis   [] Joint swelling   [] Joint pain   [] Low back pain Hematologic:  [] Easy bruising  [] Easy bleeding   [] Hypercoagulable state   [] Anemic  [] Hepatitis Gastrointestinal:  [] Blood in stool   [] Vomiting blood  [] Gastroesophageal reflux/heartburn   [] Difficulty swallowing. Genitourinary:  [x] Chronic kidney disease   [] Difficult urination  [] Frequent urination  [] Burning with urination   [] Blood in urine Skin:  [] Rashes   [x] Ulcers   [x] Wounds Psychological:  [] History of anxiety   []  History of major depression.  Physical Examination  Vitals:   11/27/18 1713 11/27/18 1952 11/27/18 2355 11/28/18 0742  BP: 132/74  (!) 140/97 122/80  Pulse: 78  89 81  Resp:  18 18 17   Temp:   97.7 F (36.5 C) 97.7 F (36.5 C)  TempSrc:   Oral Oral  SpO2: 100%  100% 100%  Weight:      Height:       Body mass index is 29.26 kg/m. Gen:  WD/WN, NAD Head: [place]/AT, No temporalis wasting. Prominent temp pulse not noted. Ear/Nose/Throat: Hearing grossly intact, nares w/o erythema  or drainage, oropharynx w/o Erythema/Exudate Eyes: Sclera non-icteric, conjunctiva clear Neck: Trachea midline.  No JVD.  Pulmonary:  Good air movement, respirations not labored, equal bilaterally.  Cardiac: RRR, normal S1, S2. Vascular:  Vessel Right Left  Radial Palpable Palpable  Ulnar Palpable Palpable  Brachial Palpable Palpable  Carotid Palpable, without bruit Palpable, without bruit  Aorta Not palpable N/A  Femoral Palpable Palpable  Popliteal Palpable Palpable  PT Non-Palpable Palpable  DP Faint Palpable   Right Lower Extremity: Thigh soft. Calf soft. Extremity is warm distally to transmetatarsal stump. Podiatry dressing / VAC intact - clean and dry.   Gastrointestinal: soft, non-tender/non-distended. No guarding/reflex.  Musculoskeletal: M/S 5/5 throughout. No edema. Neurologic: Sensation grossly intact in extremities.  Symmetrical.  Speech is fluent. Motor exam as listed above. Psychiatric: Judgment intact, Mood & affect appropriate for pt's clinical situation. Dermatologic: As above Lymph : No Cervical, Axillary, or Inguinal lymphadenopathy.  CBC Lab Results  Component Value Date   WBC 10.8 (H) 11/28/2018   HGB 9.6 (L) 11/28/2018   HCT 32.0 (L) 11/28/2018   MCV 89.9 11/28/2018   PLT 422 (H) 11/28/2018   BMET    Component Value Date/Time   NA 137 11/26/2018 0502   NA 139 06/23/2016 1553   K 4.3 11/26/2018 0502   CL 99 11/26/2018 0502   CO2 26 11/26/2018 0502   GLUCOSE 185 (H) 11/26/2018 0502   BUN 32 (H) 11/26/2018 0502   BUN 46 (H) 06/23/2016 1553   CREATININE 6.12 (H) 11/26/2018 0502   CALCIUM 8.3 (L) 11/26/2018 0502   GFRNONAA 10 (L) 11/26/2018 0502   GFRAA 11 (L) 11/26/2018 0502   Estimated Creatinine Clearance: 16 mL/min (A) (by C-G formula based on SCr of 6.12 mg/dL (H)).  COAG Lab Results  Component Value Date   INR 1.1 11/21/2018  INR 1.18 05/25/2018   INR 1.18 07/16/2014   Radiology Ct Foot Right Wo Contrast  Result Date:  11/22/2018 CLINICAL DATA:  Chronic right heel ulceration in a diabetic patient. Question osteomyelitis. EXAM: CT OF THE RIGHT FOOT WITHOUT CONTRAST TECHNIQUE: Multidetector CT imaging of the right foot was performed according to the standard protocol. Multiplanar CT image reconstructions were also generated. COMPARISON:  CT right ankle 09/09/2018. Plain films right foot 11/21/2018. FINDINGS: Bones/Joint/Cartilage The patient is status post distal transmetatarsal amputation as seen on prior plain films. Since the prior CT scan, the patient has undergone resection of the posterior calcaneus for osteomyelitis. Bones are osteopenic. No bony destructive change or periosteal reaction is identified. Ligaments Suboptimally assessed by CT. Muscles and Tendons No intramuscular fluid collection. Severe fatty atrophy of intrinsic musculature the foot is noted. Soft tissues A large skin wound on the plantar surface of the foot extends almost to the calcaneus. There is some subcutaneous edema about the foot. A small skin wound is seen over the lateral aspect of the distal fifth metatarsal. Small locules of gas are seen in the posterior soft tissues of the foot. No abscess is identified. IMPRESSION: Large skin wound on the plantar surface of the foot extends almost to the calcaneus. No CT evidence of osteomyelitis is present. Second skin wound over the heel is identified. Small skin wound over the distal fifth metatarsal without evidence of osteomyelitis. 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 Electronically Signed   By: Inge Rise M.D.   On: 11/22/2018 07:48   Dg Foot 2 Views Right  Result Date: 11/21/2018 CLINICAL DATA:  Osteomyelitis. EXAM: RIGHT FOOT - 2 VIEW COMPARISON:  Radiograph 10/05/2018 FINDINGS: Transmetatarsal amputation of the third through fifth rays, prior first and second digit resection. Resection margins are smooth. Possible soft tissue defect about the  distal stump versus edema. Progressive soft tissue defect overlying the calcaneus with slight increased bony resorption and fragmentation. Pes planus with underlying osteopenia, no additional findings of osteomyelitis. Advanced vascular calcifications. Generalized soft tissue edema. IMPRESSION: Progressive soft tissue defect overlying the calcaneus with slight increased bony resorption and fragmentation, concerning for osteomyelitis. Electronically Signed   By: Keith Rake M.D.   On: 11/21/2018 21:58   Assessment/Plan The patient is a 53 year old male with a past medical history of hypertension, diabetes, coronary artery disease, congestive heart failure, peripheral artery disease, end-stage renal disease on hemodialysis with right lower extremity foot wound/infection. 1. Right Lower Extremity PAD With Chronic Wound / Osteo: Well-known to our service.  Most recent right lower extremity endovascular intervention was on Sep 09, 2018.  At that time, the patient had a chronically occluded PT but open AT peroneal.  Physical exam is relatively unremarkable and his wound is showing signs of healing.  No indication for inpatient endovascular intervention at this time.  We will see the patient in 1 week from discharge with an arterial ultrasound in our office to continue to surveilled his disease.  The patient is in agreement with this plan. 2. ESRD: Patient denies any issues with the functioning of his PermCath at this time.  No indication for intervention. 3. Diabetes: On appropriate medications. Encouraged good control as its slows the progression of atherosclerotic disease.  Discussed with Dr. Mayme Genta, PA-C  11/28/2018 1:09 PM  This note was created with Dragon medical transcription system.  Any error is purely unintentional

## 2018-11-28 NOTE — Progress Notes (Signed)
[facility]                                                      Patient Demographics  Paul [name], is a 53 y.o. male   MRN: [id number]   DOB - 12-16-1965  Admit Date - 11/21/2018    Outpatient Primary MD for the patient is Tracie Harrier, MD  Consult requested in the Hospital by Hillary Bow, MD, On 11/28/2018   With History of -  Past Medical History:  Diagnosis Date  . Anemia   . Cardiac defibrillator in place    a. Biotronik LUmax 540 DRT, (ser # [id number]).  . Carotid arterial disease ([place])    a. s/p prior LICA stenting;  b. 01/7988 Carotid U/S: 40-59% bilat ICA stenosis. Patent LICA stent.  . CHF (congestive heart failure) ([place])   . CKD (chronic kidney disease), stage III ([place])   . Coronary artery disease    a. 2010 s/p CABG x 3.  . DDD (degenerative disc disease), lumbosacral    L5-S1  . Diabetes ([place])    Lantus at bedtime  . Gangrene of toe of right foot ([place])   . Gout of left hand 10/06/2016  . HFrEF (heart failure with reduced ejection fraction) ([place])    a. 07/2016 Echo: EF 25-30%, diff HK, mild MR, mildly dil LA, mod reduced RV fxn, PASP 63mmHg.  [place] Kitchen Hypertension    takes Coreg daily  . Ischemic cardiomyopathy    a. 07/2016 Echo: EF 25-30%, diff HK.  . Myocardial infarction ([place]) 2009  . Peripheral vascular disease ([place])   . Sleep apnea    sleep study yr ago-unable to afford cpap  . Stroke [facility]) 09   no weakness      Past Surgical History:  Procedure Laterality Date  . AMPUTATION TOE Right 08/22/2016   Procedure: AMPUTATION TOE;  Surgeon: Samara Deist, DPM;  Location: [facility];  Service: Podiatry;  Laterality: Right;  . AMPUTATION TOE Right 10/09/2016   Procedure: AMPUTATION TOE-RIGHT 2ND MPJ;  Surgeon: Samara Deist, DPM;  Location: [facility];  Service: Podiatry;  Laterality: Right;  . APLIGRAFT PLACEMENT Right 10/09/2016   Procedure: APLIGRAFT PLACEMENT;  Surgeon: Samara Deist, DPM;  Location: [facility];  Service: Podiatry;  Laterality: Right;  . CALCANEAL OSTEOTOMY Bilateral 07/28/2018   Procedure: RIGHT CALCANECTOMY BILATERAL DEBRIDEMENT OF ULCERS ON HEELS;  Surgeon: Albertine Patricia, DPM;  Location: [facility];  Service: Podiatry;  Laterality: Bilateral;  . CARDIAC DEFIBRILLATOR PLACEMENT  2011  . CAROTID ENDARTERECTOMY Left   . CHOLECYSTECTOMY  2010  . CORONARY ARTERY BYPASS GRAFT  2010   CABG x 3   . DIALYSIS/PERMA CATHETER INSERTION N/A 10/07/2018   Procedure: DIALYSIS/PERMA CATHETER INSERTION;  Surgeon: Katha Cabal, MD;  Location: [place] [facility];  Service: Cardiovascular;  Laterality: N/A;  . GRAFT APPLICATION Right 06/15/9415   Procedure: FULL THICKNESS SKIN GRAFT-RIGHT FOOT;  Surgeon: Algernon Huxley, MD;  Location: [facility];  Service: Vascular;  Laterality: Right;  . HIP SURGERY Right 1994  . INCISION AND DRAINAGE Right 09/08/2018   Procedure: INCISION AND DRAINAGE - [place] OF DEFECTIVE SKIN, SOFT TISSUE AND BONE;  Surgeon: Albertine Patricia, DPM;  Location: [facility];  Service: Podiatry;  Laterality: Right;  . INCISION AND DRAINAGE OF WOUND Right 08/22/2016   Procedure: IRRIGATION AND DEBRIDEMENT  WOUND and wound vac placement;  Surgeon: Samara Deist, DPM;  Location: [facility];  Service: Podiatry;  Laterality: Right;  . IRRIGATION AND DEBRIDEMENT ABSCESS Right 11/24/2018   Procedure: IRRIGATION AND DEBRIDEMENT ABSCESS RIGHT FOOT, COMPLICATED, DIABETIC;  Surgeon: Albertine Patricia, DPM;  Location: [facility];  Service: Podiatry;  Laterality: Right;  . LOWER EXTREMITY ANGIOGRAPHY Right 08/24/2016   Procedure: Lower Extremity Angiography;  Surgeon: Algernon Huxley, MD;  Location: [place] [facility];  Service: Cardiovascular;  Laterality: Right;  . LOWER EXTREMITY ANGIOGRAPHY Right 07/27/2018   Procedure: RIGHT Lower Extremity Angiography;  Surgeon: Algernon Huxley, MD;  Location: [place] [facility];  Service:  Cardiovascular;  Laterality: Right;  . LOWER EXTREMITY ANGIOGRAPHY Right 09/09/2018   Procedure: Lower Extremity Angiography;  Surgeon: Katha Cabal, MD;  Location: [place] [facility];  Service: Cardiovascular;  Laterality: Right;  . MASS EXCISION Right 07/18/2014   Procedure: EXCISION HETEROTOPIC BONE RIGHT HIP;  Surgeon: Frederik Pear, MD;  Location: [place];  Service: Orthopedics;  Laterality: Right;  . Open Heart Surgery  2010   x 3  . WOUND DEBRIDEMENT Right 10/09/2016   Procedure: DEBRIDEMENT WOUND;  Surgeon: Samara Deist, DPM;  Location: [facility];  Service: Podiatry;  Laterality: Right;    in for   No chief complaint on file.    HPI  Paul [name]  is a 53 y.o. male, 4 days status post debridement of infected ulceration plantar right heel with application of wound VAC.  Antibiotic beads including gentamicin and vancomycin in place as well.    Review of Systems    In addition to the HPI above,  No Fever-chills, No Headache, No changes with Vision or hearing, No problems swallowing food or Liquids, No Chest pain, Cough or Shortness of Breath, No Abdominal pain, No Nausea or Vommitting, Bowel movements are regular, No Blood in stool or Urine, No dysuria, No new skin rashes or bruises, No new joints pains-aches,  No new weakness, tingling, numbness in any extremity, No recent weight gain or loss, No polyuria, polydypsia or polyphagia, No significant Mental Stressors.  A full 10 point Review of Systems was done, except as stated above, all other Review of Systems were negative.   Social History Social History   Tobacco Use  . Smoking status: Never Smoker  . Smokeless tobacco: Never Used  Substance Use Topics  . Alcohol use: No    Family History Family History  Problem Relation Age of Onset  . Hypertension Other   . Diabetes Other   . Diabetes Father   . Hyperlipidemia Father   . Hypertension Father   . Hyperlipidemia Sister   . Hypertension Sister   .  Diabetes Sister   . Diabetes Brother   . Hypertension Brother   . Hyperlipidemia Brother     Prior to Admission medications   Medication Sig Start Date End Date Taking? Authorizing Provider  allopurinol (ZYLOPRIM) 100 MG tablet Take 1 tablet (100 mg total) by mouth daily. 08/02/18  Yes Fritzi Mandes, MD  amiodarone (PACERONE) 100 MG tablet Take 100 mg by mouth daily.  01/15/18  Yes [provider]  aspirin EC 81 MG tablet Take 1 tablet (81 mg total) by mouth daily. 10/21/16  Yes Theora Gianotti, NP  carvedilol (COREG) 3.125 MG tablet Take 1 tablet (3.125 mg total) by mouth 2 (two) times daily. 05/29/18  Yes Wieting, [name], MD  clopidogrel (PLAVIX) 75 MG tablet Take 1 tablet (75 mg total) by mouth daily  with breakfast. 09/12/18  Yes Mayo, Pete Pelt, MD  dicyclomine (BENTYL) 10 MG capsule Take 1 capsule (10 mg total) by mouth 4 (four) times daily -  before meals and at bedtime. 10/11/18  Yes Wieting, [name], MD  gabapentin (NEURONTIN) 100 MG capsule Take 1 capsule (100 mg total) by mouth 3 (three) times daily. 10/11/18  Yes Wieting, [name], MD  loperamide (IMODIUM) 2 MG capsule Take 1 capsule (2 mg total) by mouth every 8 (eight) hours as needed for diarrhea or loose stools. 10/11/18  Yes Wieting, [name], MD  predniSONE (DELTASONE) 5 MG tablet Take 1 tablet (5 mg total) by mouth daily with breakfast. 08/02/18  Yes Fritzi Mandes, MD  torsemide (DEMADEX) 20 MG tablet Take 40 mg (two tablets) on days that you do not have dialysis Patient not taking: Reported on 10/31/2018 10/11/18   Loletha Grayer, MD    Anti-infectives (From admission, onward)   Start     Dose/Rate Route Frequency Ordered Stop   11/24/18 1216  vancomycin (VANCOCIN) powder  Status:  Discontinued       As needed 11/24/18 1216 11/24/18 1251   11/24/18 1216  gentamicin (GARAMYCIN) injection  Status:  Discontinued       As needed 11/24/18 1217 11/24/18 1251   11/22/18 1800  piperacillin-tazobactam (ZOSYN) IVPB 3.375 g      3.375 g 12.5 mL/hr over 240 Minutes Intravenous Every 12 hours 11/22/18 1544     11/22/18 1600  linezolid (ZYVOX) tablet 600 mg     600 mg Oral Every 12 hours 11/22/18 1544     11/22/18 1200  vancomycin (VANCOCIN) IVPB 1000 mg/200 mL premix  Status:  Discontinued     1,000 mg 200 mL/hr over 60 Minutes Intravenous Every T-Th-Sa (Hemodialysis) 11/21/18 1955 11/22/18 1545   11/22/18 1200  ceFEPIme (MAXIPIME) 2 g in sodium chloride 0.9 % 100 mL IVPB  Status:  Discontinued     2 g 200 mL/hr over 30 Minutes Intravenous Once per day on Tue Thu Sat 11/21/18 2007 11/22/18 1541   11/21/18 2000  vancomycin (VANCOCIN) 2,000 mg in sodium chloride 0.9 % 500 mL IVPB  Status:  Discontinued     2,000 mg 250 mL/hr over 120 Minutes Intravenous  Once 11/21/18 1927 11/22/18 1528   11/21/18 1900  ceFEPIme (MAXIPIME) 2 g in sodium chloride 0.9 % 100 mL IVPB     2 g 200 mL/hr over 30 Minutes Intravenous  Once 11/21/18 1856 11/21/18 2355      Scheduled Meds: . allopurinol  100 mg Oral Daily  . amiodarone  100 mg Oral Daily  . aspirin EC  81 mg Oral Daily  . carvedilol  3.125 mg Oral BID  . Chlorhexidine Gluconate Cloth  6 each Topical Q0600  . Chlorhexidine Gluconate Cloth  6 each Topical Q0600  . clopidogrel  75 mg Oral Q breakfast  . dicyclomine  10 mg Oral TID AC & HS  . epoetin (EPOGEN/PROCRIT) injection  4,000 Units Intravenous Q T,Th,Sa-HD  . gabapentin  100 mg Oral TID  . insulin aspart  0-5 Units Subcutaneous QHS  . insulin aspart  0-9 Units Subcutaneous TID WC  . linezolid  600 mg Oral Q12H  . mupirocin ointment  1 application Nasal BID  . predniSONE  10 mg Oral Q breakfast   Continuous Infusions: . piperacillin-tazobactam (ZOSYN)  IV 3.375 g (11/28/18 0541)   PRN Meds:.HYDROcodone-acetaminophen, loperamide  Allergies  Allergen Reactions  . Other Other (See Comments)    Cardiac Problems.  Pt states he tolerates Toradol. Due to kidney and heart problems per pt  . Ibuprofen Other (See  Comments)    Heart problems  . Baclofen Other (See Comments)  . Metformin Diarrhea  . Nsaids     Due to kidney and heart problems per pt    Physical Exam: Patient's alert well oriented and pleasant this morning  Vitals  Blood pressure 122/80, pulse 81, temperature 97.7 F (36.5 C), temperature source Oral, resp. rate 17, height 5\' 10"  (1.778 m), weight 92.5 kg, SpO2 100 %.  Lower Extremity exam: Wound VAC was removed today and the ulcer was inspected.  Few areas where there is some discolored tissue and I debrided 1 area today and it bled well underneath it however.  Cultures grown out Pseudomonas and MRSA and he is on appropriate antibiotics from Dr. Tama High.  No evidence of cellulitis around the foot at this timeframe.  Data Review  CBC Recent Labs  Lab 11/21/18 2114 11/23/18 0338 11/25/18 0400 11/26/18 0502 11/28/18 0549  WBC 11.7* 10.0 10.7* 12.7* 10.8*  HGB 9.5* 9.1* 9.2* 9.5* 9.6*  HCT 31.6* 30.8* 30.7* 31.0* 32.0*  PLT 269 297 302 346 422*  MCV 91.3 90.9 90.6 89.6 89.9  MCH 27.5 26.8 27.1 27.5 27.0  MCHC 30.1 29.5* 30.0 30.6 30.0  RDW 17.2* 17.2* 17.0* 16.7* 16.7*   ------------------------------------------------------------------------------------------------------------------  Chemistries  Recent Labs  Lab 11/21/18 2114 11/23/18 0338 11/24/18 1011 11/25/18 0400 11/26/18 0502  NA 135 139  --  139 137  K 5.6* 4.0 4.3 4.0 4.3  CL 100 102  --  99 99  CO2 20* 26  --  30 26  GLUCOSE 217* 104*  --  184* 185*  BUN 53* 33*  --  18 32*  CREATININE 9.07* 5.79*  --  4.41* 6.12*  CALCIUM 8.8* 8.5*  --  8.2* 8.3*  AST 18  --   --   --   --   ALT 14  --   --   --   --   ALKPHOS 108  --   --   --   --   BILITOT 0.6  --   --   --   --    ------------------Assessment & Plan: Overall his foot appears to be stable still like to see more granulation tissue forming but I will go ahead and reapply the wound VAC at this timeframe that he stay on the antibiotics and  also get a vascular consult for them take a look at him again as well.  Active Problems:   Osteomyelitis [facility])   Family Communication: Plan discussed with patient  Albertine Patricia M.D on 11/28/2018 at 12:27 PM  Thank you for the consult, we will follow the patient with you in the Hospital.

## 2018-11-28 NOTE — Progress Notes (Signed)
[facility]  ROUNDING NOTE   Subjective:   Laying in bed. States pain is well controlled.   Wound vac exchanged this morning.   Objective:  Vital signs in last 24 hours:  Temp:  [97.7 F (36.5 C)] 97.7 F (36.5 C) (07/27 0742) Pulse Rate:  [78-89] 81 (07/27 0742) Resp:  [17-18] 17 (07/27 0742) BP: (122-140)/(74-97) 122/80 (07/27 0742) SpO2:  [100 %] 100 % (07/27 0742)  Weight change:  Filed Weights   11/22/18 0915 11/22/18 1233 11/26/18 1630  Weight: 95 kg 92.5 kg 92.5 kg    Intake/Output: I/O last 3 completed shifts: In: 740 [P.O.:640; IV Piggyback:100] Out: 3500 [Other:3500]   Intake/Output this shift:  Total I/O In: 120 [P.O.:120] Out: -   Physical Exam: General: No acute distress  Head: Normocephalic, atraumatic. Moist oral mucosal membranes  Eyes: Anicteric  Neck: Supple, trachea midline  Lungs:  Clear to auscultation, normal effort  Heart: regular  Abdomen:  Soft, nontender, bowel sounds present  Extremities: 1+ LE edema, right foot wrapped, wound vac in place  Neurologic: Awake, alert, following commands  Skin: No lesions  Access: IJ PermCath.    Basic Metabolic Panel: Recent Labs  Lab 11/21/18 2114 11/22/18 0926 11/23/18 0338 11/24/18 1011 11/25/18 0400 11/26/18 0502 11/26/18 1600  NA 135  --  139  --  139 137  --   K 5.6*  --  4.0 4.3 4.0 4.3  --   CL 100  --  102  --  99 99  --   CO2 20*  --  26  --  30 26  --   GLUCOSE 217*  --  104*  --  184* 185*  --   BUN 53*  --  33*  --  18 32*  --   CREATININE 9.07*  --  5.79*  --  4.41* 6.12*  --   CALCIUM 8.8*  --  8.5*  --  8.2* 8.3*  --   PHOS  --  5.5*  --   --  4.0 5.1* 5.3*    Liver Function Tests: Recent Labs  Lab 11/21/18 2114 11/25/18 0400 11/26/18 0502  AST 18  --   --   ALT 14  --   --   ALKPHOS 108  --   --   BILITOT 0.6  --   --   PROT 7.9  --   --   ALBUMIN 3.5 2.8* 2.8*   No results for input(s): LIPASE, AMYLASE in the last 168 hours. No results for  input(s): AMMONIA in the last 168 hours.  CBC: Recent Labs  Lab 11/21/18 2114 11/23/18 0338 11/25/18 0400 11/26/18 0502 11/28/18 0549  WBC 11.7* 10.0 10.7* 12.7* 10.8*  HGB 9.5* 9.1* 9.2* 9.5* 9.6*  HCT 31.6* 30.8* 30.7* 31.0* 32.0*  MCV 91.3 90.9 90.6 89.6 89.9  PLT 269 297 302 346 422*    Cardiac Enzymes: No results for input(s): CKTOTAL, CKMB, CKMBINDEX, TROPONINI in the last 168 hours.  BNP: Invalid input(s): POCBNP  CBG: Recent Labs  Lab 11/27/18 1137 11/27/18 1714 11/27/18 2114 11/28/18 0743 11/28/18 1205  GLUCAP 125* 202* 260* 139* 165*    Microbiology: Results for orders placed or performed during the hospital encounter of 11/21/18  SARS Coronavirus 2 (CEPHEID - Performed in [place] hospital lab), [facility] Order     Status: None   Collection Time: 11/21/18  6:42 PM   Specimen: Nasopharyngeal Swab  Result Value Ref Range Status   SARS Coronavirus 2  NEGATIVE NEGATIVE Final    Comment: (NOTE) If result is NEGATIVE SARS-CoV-2 target nucleic acids are NOT DETECTED. The SARS-CoV-2 RNA is generally detectable in upper and lower  respiratory specimens during the acute phase of infection. The lowest  concentration of SARS-CoV-2 viral copies this assay can detect is 250  copies / mL. A negative result does not preclude SARS-CoV-2 infection  and should not be used as the sole basis for treatment or other  patient management decisions.  A negative result may occur with  improper specimen collection / handling, submission of specimen other  than nasopharyngeal swab, presence of viral mutation(s) within the  areas targeted by this assay, and inadequate number of viral copies  (<250 copies / mL). A negative result must be combined with clinical  observations, patient history, and epidemiological information. If result is POSITIVE SARS-CoV-2 target nucleic acids are DETECTED. The SARS-CoV-2 RNA is generally detectable in upper and lower  respiratory specimens  dur ing the acute phase of infection.  Positive  results are indicative of active infection with SARS-CoV-2.  Clinical  correlation with patient history and other diagnostic information is  necessary to determine patient infection status.  Positive results do  not rule out bacterial infection or co-infection with other viruses. If result is PRESUMPTIVE POSTIVE SARS-CoV-2 nucleic acids MAY BE PRESENT.   A presumptive positive result was obtained on the submitted specimen  and confirmed on repeat testing.  While 2019 novel coronavirus  (SARS-CoV-2) nucleic acids may be present in the submitted sample  additional confirmatory testing may be necessary for epidemiological  and / or clinical management purposes  to differentiate between  SARS-CoV-2 and other Sarbecovirus currently known to infect humans.  If clinically indicated additional testing with an alternate test  methodology [phone]) is advised. The SARS-CoV-2 RNA is generally  detectable in upper and lower respiratory sp ecimens during the acute  phase of infection. The expected result is Negative. Fact Sheet for Patients:  StrictlyIdeas.no Fact Sheet for Healthcare Providers: BankingDealers.co.za This test is not yet approved or cleared by the [place] FDA and has been authorized for detection and/or diagnosis of SARS-CoV-2 by FDA under an Emergency Use Authorization (EUA).  This EUA will remain in effect (meaning this test can be used) for the duration of the COVID-19 declaration under Section 564(b)(1) of the Act, 21 U.S.C. section 360bbb-3(b)(1), unless the authorization is terminated or revoked sooner. Performed at [facility], [address] [place], [place] [address]   MRSA PCR Screening     Status: Abnormal   Collection Time: 11/22/18  9:38 AM   Specimen: Nasal Mucosa; Nasopharyngeal  Result Value Ref Range Status   MRSA by PCR POSITIVE (A) NEGATIVE Final     Comment:        The GeneXpert MRSA Assay (FDA approved for NASAL specimens only), is one component of a comprehensive MRSA colonization surveillance program. It is not intended to diagnose MRSA infection nor to guide or monitor treatment for MRSA infections. RESULT CALLED TO, READ BACK BY AND VERIFIED WITH: JASMINE MCLENDON AT 1526 ON 11/22/2018. TIK Performed at [facility], [address] [place], [place] [address]   Aerobic/Anaerobic Culture (surgical/deep wound)     Status: None   Collection Time: 11/22/18  1:50 PM   Specimen: Wound  Result Value Ref Range Status   Specimen Description   Final    WOUND Performed at [facility], [address], [place], [place] [address]    Special  Requests   Final    Immunocompromised,RT HEEL Performed at [facility], [address] [place], [place] [address]    Gram Stain   Final    FEW WBC PRESENT, PREDOMINANTLY PMN FEW GRAM POSITIVE COCCI    Culture   Final    FEW PSEUDOMONAS AERUGINOSA MODERATE METHICILLIN RESISTANT STAPHYLOCOCCUS AUREUS NO ANAEROBES ISOLATED Performed at [facility], [place] [address] [place], [place] [address]    Report Status 11/27/2018 FINAL  Final   Organism ID, Bacteria PSEUDOMONAS AERUGINOSA  Final   Organism ID, Bacteria METHICILLIN RESISTANT STAPHYLOCOCCUS AUREUS  Final      Susceptibility   Methicillin resistant staphylococcus aureus - MIC*    CIPROFLOXACIN >=8 RESISTANT Resistant     ERYTHROMYCIN >=8 RESISTANT Resistant     GENTAMICIN <=0.5 SENSITIVE Sensitive     OXACILLIN >=4 RESISTANT Resistant     TETRACYCLINE <=1 SENSITIVE Sensitive     VANCOMYCIN 1 SENSITIVE Sensitive     TRIMETH/SULFA >=320 RESISTANT Resistant     CLINDAMYCIN <=0.25 SENSITIVE Sensitive     RIFAMPIN <=0.5 SENSITIVE Sensitive     Inducible Clindamycin NEGATIVE Sensitive     * MODERATE METHICILLIN RESISTANT STAPHYLOCOCCUS AUREUS   Pseudomonas aeruginosa - MIC*    CEFTAZIDIME 4  SENSITIVE Sensitive     CIPROFLOXACIN >=4 RESISTANT Resistant     GENTAMICIN <=1 SENSITIVE Sensitive     IMIPENEM 2 SENSITIVE Sensitive     PIP/TAZO 16 SENSITIVE Sensitive     CEFEPIME 8 SENSITIVE Sensitive     * FEW PSEUDOMONAS AERUGINOSA  CULTURE, BLOOD (ROUTINE X 2) w Reflex to ID Panel     Status: None   Collection Time: 11/22/18  7:52 PM   Specimen: BLOOD  Result Value Ref Range Status   Specimen Description BLOOD LEFT ANTECUBITAL  Final   Special Requests   Final    BOTTLES DRAWN AEROBIC AND ANAEROBIC Blood Culture adequate volume   Culture   Final    NO GROWTH 5 DAYS Performed at [facility], [address] [place], [place] [address]    Report Status 11/27/2018 FINAL  Final  CULTURE, BLOOD (ROUTINE X 2) w Reflex to ID Panel     Status: None   Collection Time: 11/22/18  7:52 PM   Specimen: BLOOD  Result Value Ref Range Status   Specimen Description BLOOD BLOOD RIGHT ARM  Final   Special Requests   Final    BOTTLES DRAWN AEROBIC ONLY Blood Culture results may not be optimal due to an inadequate volume of blood received in culture bottles   Culture   Final    NO GROWTH 5 DAYS Performed at [facility], [address], [place], [place] [address]    Report Status 11/27/2018 FINAL  Final  Aerobic/Anaerobic Culture (surgical/deep wound)     Status: None (Preliminary result)   Collection Time: 11/24/18 12:21 PM   Specimen: [facility] Bone biopsy; Tissue  Result Value Ref Range Status   Specimen Description   Final    HEEL Performed at [facility], [address] [place], [place] [address]    Special Requests   Final    BONE CULTURE RIGHT HEEL Performed at [facility], [place], [place] [address]    Gram Stain NO WBC SEEN FEW GRAM POSITIVE COCCI   Final   Culture   Final    FEW STAPHYLOCOCCUS AUREUS FEW ENTEROCOCCUS FAECALIS RARE PSEUDOMONAS AERUGINOSA SUSCEPTIBILITIES TO FOLLOW Performed at [facility], [place] [address], [place], [place] [address]    Report Status PENDING  Incomplete   Organism ID, Bacteria STAPHYLOCOCCUS AUREUS  Final      Susceptibility   Staphylococcus aureus - MIC*    CIPROFLOXACIN >=8 RESISTANT Resistant     ERYTHROMYCIN >=8 RESISTANT Resistant     GENTAMICIN <=0.5 SENSITIVE Sensitive     OXACILLIN >=4 RESISTANT Resistant     TETRACYCLINE <=1 SENSITIVE Sensitive     VANCOMYCIN 1 SENSITIVE Sensitive     TRIMETH/SULFA >=320 RESISTANT Resistant     CLINDAMYCIN <=0.25 SENSITIVE Sensitive     RIFAMPIN <=0.5 SENSITIVE Sensitive     Inducible Clindamycin NEGATIVE Sensitive     * FEW STAPHYLOCOCCUS AUREUS    Coagulation Studies: No results for input(s): LABPROT, INR in the last 72 hours.  Urinalysis: No results for input(s): COLORURINE, LABSPEC, PHURINE, GLUCOSEU, HGBUR, BILIRUBINUR, KETONESUR, PROTEINUR, UROBILINOGEN, NITRITE, LEUKOCYTESUR in the last 72 hours.  Invalid input(s): APPERANCEUR    Imaging: No results found.   Medications:   . piperacillin-tazobactam (ZOSYN)  IV 3.375 g (11/28/18 0541)   . allopurinol  100 mg Oral Daily  . amiodarone  100 mg Oral Daily  . aspirin EC  81 mg Oral Daily  . carvedilol  3.125 mg Oral BID  . Chlorhexidine Gluconate Cloth  6 each Topical Q0600  . Chlorhexidine Gluconate Cloth  6 each Topical Q0600  . clopidogrel  75 mg Oral Q breakfast  . dicyclomine  10 mg Oral TID AC & HS  . epoetin (EPOGEN/PROCRIT) injection  4,000 Units Intravenous Q T,Th,Sa-HD  . gabapentin  100 mg Oral TID  . insulin aspart  0-5 Units Subcutaneous QHS  . insulin aspart  0-9 Units Subcutaneous TID WC  . linezolid  600 mg Oral Q12H  . mupirocin ointment  1 application Nasal BID  . predniSONE  10 mg Oral Q breakfast   HYDROcodone-acetaminophen, loperamide  Assessment/ Plan:  53 y.o. male  Paul [name]. is a 53 y.o. black male with type 2 diabetes mellitus insulin dependent, diabetic neuropathy, hypertension, coronary artery  disease status post CABG, systolic and diastolic congestive heart failure, status post AICD, carotid stenosis, stroke, obstructive sleep apnea, peripheral vascular disease, gout, osteomyelitis of heel  [place]   1.  ESRD on HD: hemodialysis treatment for tomorrow.   2. Hypertension: 122/80. Regimen of carvedilol.   3. Anemia of chronic kidney disease: hemoglobin 9.6.  - EPO with HD treatment.   4. Secondary Hyperparathyroidism: not currently on binders.   5. Diabetes mellitus type II with chronic kidney disease: history of poor control.   6. 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 MRSA and pseudomonas growing on cultures - linezolid and pip/tazo - Appreciate ID, podiatry and vascular input.    LOS: 7 Paul [name] 7/27/20203:31 PM

## 2018-11-28 NOTE — Progress Notes (Signed)
Went in the room and pt told he, "I turned off the IV pump" to which I said, "Paul [name], you are not supposed to push any buttons on the IV pump. PLEASE do not do that again." to which he replied, "I've been hospitalized so much, I know how to do everything.". I reiterated to him the importance of not pushing any buttons and to call me instead. He did not agree but said nothing. I asked him if he needed anything and he said no but thanked me.

## 2018-11-28 NOTE — Progress Notes (Signed)
ID Pt has no specific complaints Rt foot wound vac present awaiting cultures to finalize and also bone biopsy So far MRSA Very likely he will go on PO linezolid and IV ceftazidime given during dialysis Discussed with patient and care team

## 2018-11-28 NOTE — Progress Notes (Signed)
PT Cancellation Note  Patient Details Name: Paul [name]. MRN: [id number] DOB: Jun 05, 1965   Cancelled Treatment:    Reason Eval/Treat Not Completed: Other (comment).  PT consult received at 1717 tonight; pt not seen today d/t late entry of PT order (case manager notified via secure text this morning that "We do not have a PT order for this pt.").  Will initiate PT re-evaluation tomorrow.  Leitha Bleak, PT 11/28/18, 5:44 PM [phone]

## 2018-11-29 LAB — GLUCOSE, CAPILLARY
Glucose-Capillary: 124 mg/dL — ABNORMAL HIGH (ref 70–99)
Glucose-Capillary: 157 mg/dL — ABNORMAL HIGH (ref 70–99)
Glucose-Capillary: 297 mg/dL — ABNORMAL HIGH (ref 70–99)
Glucose-Capillary: 199 mg/dL — ABNORMAL HIGH (ref 70–99)

## 2018-11-29 LAB — RENAL FUNCTION PANEL
Albumin: 2.9 g/dL — ABNORMAL LOW (ref 3.5–5.0)
Anion gap: 11 (ref 5–15)
BUN: 51 mg/dL — ABNORMAL HIGH (ref 6–20)
CO2: 26 mmol/L (ref 22–32)
Calcium: 8.5 mg/dL — ABNORMAL LOW (ref 8.9–10.3)
Chloride: 99 mmol/L (ref 98–111)
Creatinine, Ser: 7.36 mg/dL — ABNORMAL HIGH (ref 0.61–1.24)
GFR calc Af Amer: 9 mL/min — ABNORMAL LOW (ref >60)
GFR calc non Af Amer: 8 mL/min — ABNORMAL LOW (ref >60)
Glucose, Bld: 239 mg/dL — ABNORMAL HIGH (ref 70–99)
Phosphorus: 5.7 mg/dL — ABNORMAL HIGH (ref 2.5–4.6)
Potassium: 5.3 mmol/L — ABNORMAL HIGH (ref 3.5–5.1)
Sodium: 136 mmol/L (ref 135–145)

## 2018-11-29 LAB — CBC
HCT: 31.1 % — ABNORMAL LOW (ref 39.0–52.0)
Hemoglobin: 9.5 g/dL — ABNORMAL LOW (ref 13.0–17.0)
MCH: 27.5 pg (ref 26.0–34.0)
MCHC: 30.5 g/dL (ref 30.0–36.0)
MCV: 89.9 fL (ref 80.0–100.0)
Platelets: 438 10*3/uL — ABNORMAL HIGH (ref 150–400)
RBC: 3.46 MIL/uL — ABNORMAL LOW (ref 4.22–5.81)
RDW: 16.9 % — ABNORMAL HIGH (ref 11.5–15.5)
WBC: 11.2 10*3/uL — ABNORMAL HIGH (ref 4.0–10.5)
nRBC: 0 % (ref 0.0–0.2)

## 2018-11-29 LAB — AEROBIC/ANAEROBIC CULTURE W GRAM STAIN (SURGICAL/DEEP WOUND): Gram Stain: NONE SEEN

## 2018-11-29 MED ORDER — INSULIN ASPART 100 UNIT/ML ~~LOC~~ SOLN
2.0000 [IU] | Freq: Three times a day (TID) | SUBCUTANEOUS | Status: DC
  Administered 2018-11-29 – 2018-11-30 (×4): 2 [IU] via SUBCUTANEOUS
  Filled 2018-11-29 (×2): qty 1

## 2018-11-29 MED ORDER — PIPERACILLIN-TAZOBACTAM 3.375 G IVPB 30 MIN: 3.3750 g | Freq: Two times a day (BID) | INTRAVENOUS | Status: DC

## 2018-11-29 MED ORDER — PIPERACILLIN-TAZOBACTAM 3.375 G IVPB
3.3750 g | Freq: Two times a day (BID) | INTRAVENOUS | Status: AC
  Administered 2018-11-30 – 2018-12-01 (×3): 3.375 g via INTRAVENOUS
  Filled 2018-11-29 (×3): qty 50

## 2018-11-29 MED ORDER — POLYETHYLENE GLYCOL 3350 17 G PO PACK: 17.0000 g | PACK | Freq: Every day | ORAL | Status: DC

## 2018-11-29 MED ORDER — EPOETIN ALFA 10000 UNIT/ML IJ SOLN
10000.0000 [IU] | INTRAMUSCULAR | Status: DC
  Administered 2018-11-29 – 2018-12-01 (×2): 10000 [IU] via INTRAVENOUS
  Filled 2018-11-29 (×4): qty 1

## 2018-11-29 MED ORDER — SODIUM CHLORIDE 0.9 % IV SOLN
1.0000 g | INTRAVENOUS | Status: DC
  Filled 2018-11-29: qty 1

## 2018-11-29 NOTE — Progress Notes (Signed)
[facility] NAME: Paul [name]    MR#:  [id number]  DATE OF BIRTH:  04-07-1966  SUBJECTIVE:   Pain is well controlled.   Seen in dialysis unit REVIEW OF SYSTEMS:  CONSTITUTIONAL: No fever, fatigue or weakness.  EYES: No blurred or double vision.  EARS, NOSE, AND THROAT: No tinnitus or ear pain.  RESPIRATORY: No cough, shortness of breath, wheezing or hemoptysis.  CARDIOVASCULAR: No chest pain, orthopnea, edema.  GASTROINTESTINAL: No nausea, vomiting, diarrhea or abdominal pain.  GENITOURINARY: No dysuria, hematuria.  ENDOCRINE: No polyuria, nocturia,  HEMATOLOGY: No anemia, easy bruising or bleeding SKIN: No rash or lesion. MUSCULOSKELETAL: Right foot pain, bilateral hand deformities.   NEUROLOGIC: No tingling, numbness, weakness.  PSYCHIATRY: No anxiety or depression.   DRUG ALLERGIES:   Allergies  Allergen Reactions  . Other Other (See Comments)    Cardiac Problems. Pt states he tolerates Toradol. Due to kidney and heart problems per pt  . Ibuprofen Other (See Comments)    Heart problems  . Baclofen Other (See Comments)  . Metformin Diarrhea  . Nsaids     Due to kidney and heart problems per pt    VITALS:  Blood pressure (!) 148/116, pulse 84, temperature 98.5 F (36.9 C), temperature source Oral, resp. rate (!) 23, height 5\' 10"  (1.778 m), weight 92.5 kg, SpO2 99 %.  PHYSICAL EXAMINATION:  GENERAL:  53 y.o.-year-old patient sitting up on edge of bed with no acute distress.  EYES: Pupils equal, round, reactive to light and accommodation. No scleral icterus. Extraocular muscles intact.  HEENT: Head atraumatic, normocephalic. Oropharynx and nasopharynx clear.  NECK:  Supple, no jugular venous distention. No thyroid enlargement, no tenderness.  LUNGS: Normal breath sounds bilaterally, no wheezing, rales,rhonchi or crepitation. No use of accessory muscles of respiration.  CARDIOVASCULAR: S1, S2 normal. No murmurs, rubs,  or gallops.  ABDOMEN: Soft, nontender, nondistended. Bowel sounds present. No organomegaly or mass.  EXTREMITIES: No pedal edema, cyanosis, or clubbing. 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  Bilateral hand numbness. NEUROLOGIC: Cranial nerves II through XII are intact. Muscle strength 5/5 in all extremities. Sensation intact. Gait not checked.  PSYCHIATRIC: The patient is alert and oriented x 3.  SKIN: +wound vac and dry dressing place over right foot.,  Right foot wrapped in dressing  LABORATORY PANEL:   CBC Recent Labs  Lab 11/29/18 0033  WBC 11.2*  HGB 9.5*  HCT 31.1*  PLT 438*   ------------------------------------------------------------------------------------------------------------------  Chemistries  Recent Labs  Lab 11/29/18 0033  NA 136  K 5.3*  CL 99  CO2 26  GLUCOSE 239*  BUN 51*  CREATININE 7.36*  CALCIUM 8.5*   ------------------------------------------------------------------------------------------------------------------  Cardiac Enzymes No results for input(s): TROPONINI in the last 168 hours. ------------------------------------------------------------------------------------------------------------------  RADIOLOGY:  No results found.  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 -Dressing changes done by podiatry today.  Wound VAC continued, Wound and bone cultures showing Pseudomonas and MRSA. Discussed with infectious disease for antibiotic regimen changes -Patient has refused BKA -NWB to right foot  ESRD on HD TTS- stable -Nephrology following  Paroxysmal atrial fibrillation-patient has been in normal sinus rhythm here. 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   D/C plan is home with home health once cleared by podiatry  All the records are reviewed and case discussed with Care Management/Social Worker Management plans discussed with the patient, family and they are in agreement.  CODE STATUS: Full.  TOTAL TIME TAKING CARE OF THIS PATIENT: 35 minutes.   POSSIBLE D/C IN 2-3 DAYS, DEPENDING ON CLINICAL CONDITION.  Neita Carp M.D on 11/29/2018   Between 7am to 6pm - Pager - [phone]  After 6pm go to [email or web address] - password [email or web address]  Office  [phone]  CC: Primary care physician; Tracie Harrier, MD  Note: This dictation was prepared with Dragon dictation along with smaller phrase technology. Any transcriptional errors that result from this process are unintentional.

## 2018-11-29 NOTE — Evaluation (Signed)
Physical Therapy ReEvaluation Patient Details Name: Nicholes Hibler. MRN: [id number] DOB: [birth year] Today's Date: 11/29/2018   History of Present Illness  53 y/o male here for non-healing wound to R heel; admited for management of R heel osteomyelitis.  I & D 7/23, wound vac placed.  Medical team has discussed/recommended R BKA, but patient refusing.    Clinical Impression  Pt more confident with mobility and ability to maintain R NWBing this session than previous bout with PT and more specifically was able to use UEs appropriately on walker for limited in-home/in-room distance.  He reports home is being retro-fitted with ramp and more w/c accessibility.  He was able to maintain NWBing w/o physical assist consistently with sitting EOB and was better able to use UEs appropriately with FWW, stating "I did do a lot better with that [NWBing R] today."  Pt feeling confident about being able to manage at home, apparently transportation for dialysis has been set up as well.  Answered questions and helped trouble-shoot returning home.  Follow Up Recommendations Home health PT    Equipment Recommendations  Wheelchair (measurements PT)(having ramp made today, not interested in [facility] 2/2 space)    Recommendations for Other Services       Precautions / Restrictions Precautions Precautions: Fall Restrictions RLE Weight Bearing: Non weight bearing      Mobility  Bed Mobility Overal bed mobility: Modified Independent             General bed mobility comments: Pt able to get himself up to EOB w/o assist  Transfers Overall transfer level: Needs assistance Equipment used: Rolling walker (2 wheeled) Transfers: Sit to/from Stand Sit to Stand: Min assist;Min guard         General transfer comment: extra cuing to insure set up and awareness with R NWBing, he did much better with this today and actually maintained NWBing the the entire transfer (and limited in-room  ambulation)  Ambulation/Gait Ambulation/Gait assistance: Min guard Gait Distance (Feet): 10 Feet Assistive device: Rolling walker (2 wheeled)       General Gait Details: hop-to gait pattern; able to maintain NWB R LE with cuing and relative confidence using UEs on walker.  Pt reports that he did much better than previous attempt, this PT much more confident that for limited in-home mobility he could maintain R NWB effectively  Stairs            Wheelchair Mobility    Modified Rankin (Stroke Patients Only)       Balance Overall balance assessment: Modified Independent(good sitting balance, much improved standing balance with AD)                                           Pertinent Vitals/Pain Pain Assessment: (reports only minimal pain with R foot)    Home Living Family/patient expects to be discharged to:: Private residence Living Arrangements: Non-relatives/Friends Available Help at Discharge: Friend(s);Available PRN/intermittently Type of Home: House Home Access: Ramped entrance(ramp being constructed this date)     Home Layout: One level        Prior Function Level of Independence: Independent with assistive device(s)         Comments: Ambulates household distances with 4ww (uses RW in community--keeps walker in car); house too small for manual w/c so pt reports he doesn't have it anymore, home currently being retro-fitted for accessibility.  Pt  reports fall 4 months ago.      Hand Dominance        Extremity/Trunk Assessment   Upper Extremity Assessment Upper Extremity Assessment: (grossly 4/4 t/o; h/o CTS limiting grasp and fine motor)    Lower Extremity Assessment Lower Extremity Assessment: Overall WFL for tasks assessed(no WBing through dorsum of foot)       Communication   Communication: No difficulties  Cognition Arousal/Alertness: Awake/alert Behavior During Therapy: WFL for tasks assessed/performed Overall Cognitive  Status: Within Functional Limits for tasks assessed                                        General Comments      Exercises     Assessment/Plan    PT Assessment Patient needs continued PT services  PT Problem List Decreased strength;Decreased activity tolerance;Decreased mobility;Decreased safety awareness;Decreased range of motion;Decreased balance;Decreased coordination;Decreased knowledge of use of DME;Decreased knowledge of precautions;Decreased skin integrity       PT Treatment Interventions DME instruction;Therapeutic activities;Balance training;Gait training;Functional mobility training;Therapeutic exercise;Patient/family education    PT Goals (Current goals can be found in the Care Plan section)  Acute Rehab PT Goals Patient Stated Goal: go home and be able to manage PT Goal Formulation: With patient Time For Goal Achievement: 12/13/18 Potential to Achieve Goals: Fair    Frequency Min 2X/week   Barriers to discharge Decreased caregiver support      Co-evaluation               AM-PAC PT "6 Clicks" Mobility  Outcome Measure Help needed turning from your back to your side while in a flat bed without using bedrails?: None Help needed moving from lying on your back to sitting on the side of a flat bed without using bedrails?: None Help needed moving to and from a bed to a chair (including a wheelchair)?: A Little Help needed standing up from a chair using your arms (e.g., wheelchair or bedside chair)?: A Little Help needed to walk in hospital room?: A Little Help needed climbing 3-5 steps with a railing? : A Lot 6 Click Score: 19    End of Session Equipment Utilized During Treatment: Gait belt Activity Tolerance: Patient tolerated treatment well Patient left: with bed alarm set;with call bell/phone within reach   PT Visit Diagnosis: Muscle weakness (generalized) (M62.81);Difficulty in walking, not elsewhere classified (R26.2)    Time:  2536-6440 PT Time Calculation (min) (ACUTE ONLY): 30 min   Charges:   PT Evaluation $PT Re-evaluation: 1 Re-eval PT Treatments $Therapeutic Activity: 8-22 mins        Kreg Shropshire, DPT 11/29/2018, 11:17 AM

## 2018-11-29 NOTE — Progress Notes (Signed)
[facility]  ROUNDING NOTE   Subjective:   Seen and examined on hemodialysis treatment. Tolerating treatment well. UF goal of 3 liters.     HEMODIALYSIS FLOWSHEET:  Blood Flow Rate (mL/min): 400 mL/min Arterial Pressure (mmHg): -190 mmHg Venous Pressure (mmHg): 130 mmHg Transmembrane Pressure (mmHg): 60 mmHg Ultrafiltration Rate (mL/min): 1140 mL/min Dialysate Flow Rate (mL/min): 800 ml/min Conductivity: Machine : 14 Conductivity: Machine : 14 Dialysis Fluid Bolus: Normal Saline Bolus Amount (mL): 250 mL Dialysate Change: 2K    Objective:  Vital signs in last 24 hours:  Temp:  [98.6 F (37 C)-99 F (37.2 C)] 98.6 F (37 C) (07/28 0741) Pulse Rate:  [79-88] 81 (07/28 0741) Resp:  [17-18] 18 (07/28 0741) BP: (109-130)/(71-81) 130/81 (07/28 0741) SpO2:  [98 %-100 %] 100 % (07/28 0741)  Weight change:  Filed Weights   11/22/18 0915 11/22/18 1233 11/26/18 1630  Weight: 95 kg 92.5 kg 92.5 kg    Intake/Output: I/O last 3 completed shifts: In: 520 [P.O.:520] Out: 1 [Stool:1]   Intake/Output this shift:  Total I/O In: -  Out: 1 [Stool:1]  Physical Exam: General: No acute distress, laying in bed  Head: Normocephalic, atraumatic. Moist oral mucosal membranes  Eyes: Anicteric  Neck: Supple, trachea midline  Lungs:  Clear to auscultation, normal effort  Heart: regular  Abdomen:  Soft, nontender, bowel sounds present  Extremities: 1+ LE edema, right foot wrapped, wound vac in place  Neurologic: Awake, alert, following commands  Skin: No lesions  Access: IJ PermCath.    Basic Metabolic Panel: Recent Labs  Lab 11/23/18 0338 11/24/18 1011 11/25/18 0400 11/26/18 0502 11/26/18 1600 11/28/18 2025 11/29/18 0033  NA 139  --  139 137  --  134* 136  K 4.0 4.3 4.0 4.3  --  5.3* 5.3*  CL 102  --  99 99  --  97* 99  CO2 26  --  30 26  --  23 26  GLUCOSE 104*  --  184* 185*  --  298* 239*  BUN 33*  --  18 32*  --  48* 51*  CREATININE 5.79*  --  4.41*  6.12*  --  6.95* 7.36*  CALCIUM 8.5*  --  8.2* 8.3*  --  8.5* 8.5*  PHOS  --   --  4.0 5.1* 5.3* 5.9* 5.7*    Liver Function Tests: Recent Labs  Lab 11/25/18 0400 11/26/18 0502 11/28/18 2025 11/29/18 0033  ALBUMIN 2.8* 2.8* 3.1* 2.9*   No results for input(s): LIPASE, AMYLASE in the last 168 hours. No results for input(s): AMMONIA in the last 168 hours.  CBC: Recent Labs  Lab 11/25/18 0400 11/26/18 0502 11/28/18 0549 11/28/18 2025 11/29/18 0033  WBC 10.7* 12.7* 10.8* 11.3* 11.2*  HGB 9.2* 9.5* 9.6* 10.5* 9.5*  HCT 30.7* 31.0* 32.0* 35.3* 31.1*  MCV 90.6 89.6 89.9 91.7 89.9  PLT 302 346 422* 450* 438*    Cardiac Enzymes: No results for input(s): CKTOTAL, CKMB, CKMBINDEX, TROPONINI in the last 168 hours.  BNP: Invalid input(s): POCBNP  CBG: Recent Labs  Lab 11/28/18 1205 11/28/18 1652 11/28/18 2130 11/29/18 0743 11/29/18 1204  GLUCAP 165* 218* 298* 124* 157*    Microbiology: Results for orders placed or performed during the hospital encounter of 11/21/18  SARS Coronavirus 2 (CEPHEID - Performed in [facility] hospital lab), Hosp Order     Status: None   Collection Time: 11/21/18  6:42 PM   Specimen: Nasopharyngeal Swab  Result Value Ref Range Status  SARS Coronavirus 2 NEGATIVE NEGATIVE Final    Comment: (NOTE) If result is NEGATIVE SARS-CoV-2 target nucleic acids are NOT DETECTED. The SARS-CoV-2 RNA is generally detectable in upper and lower  respiratory specimens during the acute phase of infection. The lowest  concentration of SARS-CoV-2 viral copies this assay can detect is 250  copies / mL. A negative result does not preclude SARS-CoV-2 infection  and should not be used as the sole basis for treatment or other  patient management decisions.  A negative result may occur with  improper specimen collection / handling, submission of specimen other  than nasopharyngeal swab, presence of viral mutation(s) within the  areas targeted by this assay, and  inadequate number of viral copies  (<250 copies / mL). A negative result must be combined with clinical  observations, patient history, and epidemiological information. If result is POSITIVE SARS-CoV-2 target nucleic acids are DETECTED. The SARS-CoV-2 RNA is generally detectable in upper and lower  respiratory specimens dur ing the acute phase of infection.  Positive  results are indicative of active infection with SARS-CoV-2.  Clinical  correlation with patient history and other diagnostic information is  necessary to determine patient infection status.  Positive results do  not rule out bacterial infection or co-infection with other viruses. If result is PRESUMPTIVE POSTIVE SARS-CoV-2 nucleic acids MAY BE PRESENT.   A presumptive positive result was obtained on the submitted specimen  and confirmed on repeat testing.  While 2019 novel coronavirus  (SARS-CoV-2) nucleic acids may be present in the submitted sample  additional confirmatory testing may be necessary for epidemiological  and / or clinical management purposes  to differentiate between  SARS-CoV-2 and other Sarbecovirus currently known to infect humans.  If clinically indicated additional testing with an alternate test  methodology [phone]) is advised. The SARS-CoV-2 RNA is generally  detectable in upper and lower respiratory sp ecimens during the acute  phase of infection. The expected result is Negative. Fact Sheet for Patients:  StrictlyIdeas.no Fact Sheet for Healthcare Providers: BankingDealers.co.za This test is not yet approved or cleared by the [place] FDA and has been authorized for detection and/or diagnosis of SARS-CoV-2 by FDA under an Emergency Use Authorization (EUA).  This EUA will remain in effect (meaning this test can be used) for the duration of the COVID-19 declaration under Section 564(b)(1) of the Act, 21 U.S.C. section 360bbb-3(b)(1), unless the  authorization is terminated or revoked sooner. Performed at [facility], [address] [place], [place] [address]   MRSA PCR Screening     Status: Abnormal   Collection Time: 11/22/18  9:38 AM   Specimen: Nasal Mucosa; Nasopharyngeal  Result Value Ref Range Status   MRSA by PCR POSITIVE (A) NEGATIVE Final    Comment:        The GeneXpert MRSA Assay (FDA approved for NASAL specimens only), is one component of a comprehensive MRSA colonization surveillance program. It is not intended to diagnose MRSA infection nor to guide or monitor treatment for MRSA infections. RESULT CALLED TO, READ BACK BY AND VERIFIED WITH: JASMINE MCLENDON AT 1526 ON 11/22/2018. TIK Performed at [facility], [address] [place], [place] [address]   Aerobic/Anaerobic Culture (surgical/deep wound)     Status: None   Collection Time: 11/22/18  1:50 PM   Specimen: Wound  Result Value Ref Range Status   Specimen Description   Final    WOUND Performed at [facility], [address] [place], [place] [address]  Special Requests   Final    Immunocompromised,RT HEEL Performed at [facility], [address] [place], [name] [address]    Gram Stain   Final    FEW WBC PRESENT, PREDOMINANTLY PMN FEW GRAM POSITIVE COCCI    Culture   Final    FEW PSEUDOMONAS AERUGINOSA MODERATE METHICILLIN RESISTANT STAPHYLOCOCCUS AUREUS NO ANAEROBES ISOLATED Performed at [facility], [place] [address], [place], [place] [address]    Report Status 11/27/2018 FINAL  Final   Organism ID, Bacteria PSEUDOMONAS AERUGINOSA  Final   Organism ID, Bacteria METHICILLIN RESISTANT STAPHYLOCOCCUS AUREUS  Final      Susceptibility   Methicillin resistant staphylococcus aureus - MIC*    CIPROFLOXACIN >=8 RESISTANT Resistant     ERYTHROMYCIN >=8 RESISTANT Resistant     GENTAMICIN <=0.5 SENSITIVE Sensitive     OXACILLIN >=4 RESISTANT Resistant     TETRACYCLINE <=1 SENSITIVE Sensitive      VANCOMYCIN 1 SENSITIVE Sensitive     TRIMETH/SULFA >=320 RESISTANT Resistant     CLINDAMYCIN <=0.25 SENSITIVE Sensitive     RIFAMPIN <=0.5 SENSITIVE Sensitive     Inducible Clindamycin NEGATIVE Sensitive     * MODERATE METHICILLIN RESISTANT STAPHYLOCOCCUS AUREUS   Pseudomonas aeruginosa - MIC*    CEFTAZIDIME 4 SENSITIVE Sensitive     CIPROFLOXACIN >=4 RESISTANT Resistant     GENTAMICIN <=1 SENSITIVE Sensitive     IMIPENEM 2 SENSITIVE Sensitive     PIP/TAZO 16 SENSITIVE Sensitive     CEFEPIME 8 SENSITIVE Sensitive     * FEW PSEUDOMONAS AERUGINOSA  CULTURE, BLOOD (ROUTINE X 2) w Reflex to ID Panel     Status: None   Collection Time: 11/22/18  7:52 PM   Specimen: BLOOD  Result Value Ref Range Status   Specimen Description BLOOD LEFT ANTECUBITAL  Final   Special Requests   Final    BOTTLES DRAWN AEROBIC AND ANAEROBIC Blood Culture adequate volume   Culture   Final    NO GROWTH 5 DAYS Performed at [facility], [address] [place], [place] [address]    Report Status 11/27/2018 FINAL  Final  CULTURE, BLOOD (ROUTINE X 2) w Reflex to ID Panel     Status: None   Collection Time: 11/22/18  7:52 PM   Specimen: BLOOD  Result Value Ref Range Status   Specimen Description BLOOD BLOOD RIGHT ARM  Final   Special Requests   Final    BOTTLES DRAWN AEROBIC ONLY Blood Culture results may not be optimal due to an inadequate volume of blood received in culture bottles   Culture   Final    NO GROWTH 5 DAYS Performed at [facility], [address] [place], [place] [address]    Report Status 11/27/2018 FINAL  Final  Aerobic/Anaerobic Culture (surgical/deep wound)     Status: None   Collection Time: 11/24/18 12:21 PM   Specimen: [facility] Bone biopsy; Tissue  Result Value Ref Range Status   Specimen Description   Final    HEEL Performed at [facility], [address] [place], [place] [address]    Special Requests   Final    BONE CULTURE RIGHT HEEL Performed  at [facility], [place], [place] [address]    Gram Stain NO WBC SEEN FEW GRAM POSITIVE COCCI   Final   Culture   Final    FEW METHICILLIN RESISTANT STAPHYLOCOCCUS AUREUS FEW ENTEROCOCCUS FAECALIS RARE PSEUDOMONAS AERUGINOSA NO ANAEROBES ISOLATED Performed at  [facility], [place] [address] [place], [place] [address]    Report Status 11/29/2018 FINAL  Final   Organism ID, Bacteria METHICILLIN RESISTANT STAPHYLOCOCCUS AUREUS  Final   Organism ID, Bacteria ENTEROCOCCUS FAECALIS  Final   Organism ID, Bacteria PSEUDOMONAS AERUGINOSA  Final      Susceptibility   Enterococcus faecalis - MIC*    AMPICILLIN <=2 SENSITIVE Sensitive     VANCOMYCIN 1 SENSITIVE Sensitive     GENTAMICIN SYNERGY SENSITIVE Sensitive     * FEW ENTEROCOCCUS FAECALIS   Methicillin resistant staphylococcus aureus - MIC*    CIPROFLOXACIN >=8 RESISTANT Resistant     ERYTHROMYCIN >=8 RESISTANT Resistant     GENTAMICIN <=0.5 SENSITIVE Sensitive     OXACILLIN >=4 RESISTANT Resistant     TETRACYCLINE <=1 SENSITIVE Sensitive     VANCOMYCIN 1 SENSITIVE Sensitive     TRIMETH/SULFA >=320 RESISTANT Resistant     CLINDAMYCIN <=0.25 SENSITIVE Sensitive     RIFAMPIN <=0.5 SENSITIVE Sensitive     Inducible Clindamycin NEGATIVE Sensitive     * FEW METHICILLIN RESISTANT STAPHYLOCOCCUS AUREUS   Pseudomonas aeruginosa - MIC*    CEFTAZIDIME 4 SENSITIVE Sensitive     CIPROFLOXACIN >=4 RESISTANT Resistant     GENTAMICIN <=1 SENSITIVE Sensitive     IMIPENEM 2 SENSITIVE Sensitive     PIP/TAZO 16 SENSITIVE Sensitive     CEFEPIME 4 SENSITIVE Sensitive     * RARE PSEUDOMONAS AERUGINOSA    Coagulation Studies: No results for input(s): LABPROT, INR in the last 72 hours.  Urinalysis: No results for input(s): COLORURINE, LABSPEC, PHURINE, GLUCOSEU, HGBUR, BILIRUBINUR, KETONESUR, PROTEINUR, UROBILINOGEN, NITRITE, LEUKOCYTESUR in the last 72 hours.  Invalid input(s): APPERANCEUR    Imaging: No results  found.   Medications:   . sodium chloride    . sodium chloride    . piperacillin-tazobactam (ZOSYN)  IV 3.375 g (11/29/18 9892)   . allopurinol  100 mg Oral Daily  . amiodarone  100 mg Oral Daily  . aspirin EC  81 mg Oral Daily  . carvedilol  3.125 mg Oral BID  . Chlorhexidine Gluconate Cloth  6 each Topical Q0600  . clopidogrel  75 mg Oral Q breakfast  . dicyclomine  10 mg Oral TID AC & HS  . epoetin (EPOGEN/PROCRIT) injection  10,000 Units Intravenous Q T,Th,Sa-HD  . gabapentin  100 mg Oral TID  . insulin aspart  0-5 Units Subcutaneous QHS  . insulin aspart  0-9 Units Subcutaneous TID WC  . linezolid  600 mg Oral Q12H  . predniSONE  10 mg Oral Q breakfast   sodium chloride, sodium chloride, alteplase, heparin, HYDROcodone-acetaminophen, lidocaine (PF), lidocaine-prilocaine, loperamide, pentafluoroprop-tetrafluoroeth  Assessment/ Plan:  [name] is a 53 y.o. black male with type 2 diabetes mellitus insulin dependent, diabetic neuropathy, hypertension, coronary artery disease status post CABG, systolic and diastolic congestive heart failure, status post AICD, carotid stenosis, stroke, obstructive sleep apnea, peripheral vascular disease, gout, osteomyelitis of heel  [place]   1.  ESRD on HD: seen and examined on hemodialysis treatment.   2. Hypertension:  - Continue carvedilol.   3. Anemia of chronic kidney disease: hemoglobin 9.5  - EPO with HD treatment.   4. Secondary Hyperparathyroidism: not currently on binders.   5. Diabetes mellitus type II with chronic kidney disease: history of poor control.   6. 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 MRSA and pseudomonas growing on cultures - linezolid and  pip/tazo - Appreciate ID, podiatry and vascular input.    LOS: 8 Paul [name] 7/28/20201:56 PM

## 2018-11-29 NOTE — Progress Notes (Signed)
Hemodialysis treatment completed at 1633. Net UF removed 3L.  Blood rinsed back, CVC flushed and locked with NS. Red caps replaced. Dressing remains intact and insertion site remains without signs and symptoms of infection.  Post hemodialysis report given to Esperanza Richters, RN.    11/29/18 1633  Vital Signs  Pulse Rate 86  Resp 18  BP 117/62  BP Location Left Arm  BP Method Automatic  Patient Position (if appropriate) Lying  Oxygen Therapy  SpO2 100 %  O2 Device Room Air  Pain Assessment  Pain Scale 0-10  Pain Score 0  During Hemodialysis Assessment  Dialysis Fluid Bolus Normal Saline  Bolus Amount (mL) 250 mL (rinseback)  Intra-Hemodialysis Comments Tx completed  Post-Hemodialysis Assessment  Rinseback Volume (mL) 250 mL  KECN 65.5 V  Dialyzer Clearance Lightly streaked  Duration of HD Treatment -hour(s) 3 hour(s)  Hemodialysis Intake (mL) 500 mL  UF Total -Machine (mL) 3500 mL  Net UF (mL) 3000 mL  Tolerated HD Treatment Yes  Education / Care Plan  Dialysis Education Provided Yes  Documented Education in Care Plan Yes  Outpatient Plan of Care Reviewed and on Chart Yes  Hemodialysis Catheter Right Subclavian Double lumen Permanent (Tunneled)  Placement Date: (c) 11/24/18   Orientation: Right  Access Location: Subclavian  Hemodialysis Catheter Type: Double lumen Permanent (Tunneled)  Site Condition No complications  Blue Lumen Status Saline locked;Capped (Central line)  Red Lumen Status Saline locked;Capped (Central line)  Dressing Status Clean;Dry;Intact

## 2018-11-29 NOTE — Progress Notes (Signed)
Inpatient Diabetes Program Recommendations  AACE/ADA: New Consensus Statement on Inpatient Glycemic Control   Target Ranges:  Prepandial:   less than 140 mg/dL      Peak postprandial:   less than 180 mg/dL (1-2 hours)      Critically ill patients:  140 - 180 mg/dL   Results for Paul, [name] (MRN [id number]) as of 11/29/2018 10:30  Ref. Range 11/28/2018 07:43 11/28/2018 12:05 11/28/2018 16:52 11/28/2018 21:30 11/29/2018 07:43  Glucose-Capillary Latest Ref Range: 70 - 99 mg/dL 139 (H) 165 (H) 218 (H) 298 (H) 124 (H)   Review of Glycemic Control  Diabetes history: DM2 Outpatient Diabetes medications: None; diet controlled Current orders for Inpatient glycemic control: Novolog 0-9 units TID with meals, Novolog 0-5 units QHS; Prednisone 10 mg QAM  Inpatient Diabetes Program Recommendations:   Diet: Please consider discontinuing Regular diet and ordering Carb Modified Renal diet (if appropriate).  Insulin - Meal Coverage: If diet is changed, steroids are continued, and post prandial glucose continues to be greater than 180 mg/dl, please consider ordering Novolog 2 units TID with meals for meal coverage if patient eats at least 50% of meals.  Thanks, Barnie Alderman, RN, MSN, CDE Diabetes Coordinator Inpatient Diabetes Program [phone] (Team Pager from 8am to 5pm)

## 2018-11-29 NOTE — Progress Notes (Signed)
Pre hemodialysis assessment   11/29/18 1327  Neurological  Level of Consciousness Alert  Orientation Level Oriented X4  Respiratory  Respiratory Pattern Regular;Unlabored  Chest Assessment Chest expansion symmetrical  Bilateral Breath Sounds Clear;Diminished  Cardiac  Pulse Regular  Jugular Venous Distention (JVD) No  ECG Monitor No  Cardiac Rhythm NSR  Antiarrhythmic device Yes  Antiarrhythmic device  Antiarrhythmic device ICD  Vascular  R Radial Pulse +2  L Radial Pulse +2  Edema Left lower extremity;Right lower extremity  RLE Edema Non-pitting  LLE Edema Non-Pitting  Integumentary  Integumentary (WDL) X  Skin Color Appropriate for ethnicity  Skin Condition Dry;Flaky  Musculoskeletal  Musculoskeletal (WDL) X  Generalized Weakness Yes  Gastrointestinal  Bowel Sounds Assessment Active  GU Assessment  Genitourinary (WDL) X  Genitourinary Symptoms Oliguria  Psychosocial  Psychosocial (WDL) WDL

## 2018-11-29 NOTE — Progress Notes (Signed)
Pre hemodialysis report received from Esperanza Richters, RN. Received patient in acute room via bed. Awake, alert and verbally responsive. No acute distress noted. CVC without signs and symptoms of infection.  Dressing in place and uncompromised. Accessed CVC per policy and found patent on each side. Hemodialysis treatment initiated at 1327. Plan for 3 hours treatment with UF of 3L as tolerated.    11/29/18 1327  Hand-Off documentation  Report received from (Full Name) Doristine Counter, RN  Vital Signs  Temp 98.5 F (36.9 C)  Temp Source Oral  Pulse Rate 88  Pulse Rate Source Monitor  Resp 20  BP (!) 151/87  BP Location Left Arm  BP Method Automatic  Patient Position (if appropriate) Lying  Oxygen Therapy  SpO2 100 %  O2 Device Room Air  Pain Assessment  Pain Scale 0-10  Pain Score 0  Time-Out for Hemodialysis  What Procedure? hemodialysis  Pt Identifiers(min of two) First/Last Name;MRN/Account#  Correct Site? Yes  Correct Side? Yes  Correct Procedure? Yes  Consents Verified? Yes  Rad Studies Available? N/A  Safety Precautions Reviewed? Yes  Engineer, civil (consulting) Number 7  Station Number 2  UF/Alarm Test Passed  Conductivity: Meter 14  Conductivity: Machine  14.1  Reverse Osmosis 7.4  Normal Saline Lot Number [id number]  Dialyzer Lot Number [id number]  Disposable Set Lot Number [id number]  Machine Temperature 98.6 F (37 C)  Musician and Audible Yes  Blood Lines Intact and Secured Yes  Pre Treatment Patient Checks  Vascular access used during treatment Catheter  HD catheter dressing before treatment WDL  Hepatitis B Surface Antigen Results Negative  Date Hepatitis B Surface Antigen Drawn 11/24/18  Hepatitis B Surface Antibody  (susceptible)  Date Hepatitis B Surface Antibody Drawn 11/24/18  Hemodialysis Consent Verified Yes  Hemodialysis Standing Orders Initiated Yes  ECG (Telemetry) Monitor On Yes  Prime Ordered Normal Saline  Length of  DialysisTreatment -hour(s) 3  Hour(s)  Dialyzer Elisio 17H NR  Dialysate 2K;2.5 Ca  Dialysate Flow Ordered 600  Blood Flow Rate Ordered 400 mL/min  Ultrafiltration Goal 2000 Liters  During Hemodialysis Assessment  Blood Flow Rate (mL/min) 400 mL/min  Arterial Pressure (mmHg) -180 mmHg  Venous Pressure (mmHg) 140 mmHg  Transmembrane Pressure (mmHg) 70 mmHg  Ultrafiltration Rate (mL/min) 1150 mL/min  Dialysate Flow Rate (mL/min) 600 ml/min  Conductivity: Machine  14.1  Dialysis Fluid Bolus Normal Saline  Bolus Amount (mL) 250 mL (prime)  Intra-Hemodialysis Comments Tx initiated

## 2018-11-29 NOTE — Progress Notes (Signed)
OT Cancellation Note  Patient Details Name: Paul [name]. MRN: [id number] DOB: 12-Feb-1966   Cancelled Treatment:    Reason Eval/Treat Not Completed: Patient at procedure or test/ unavailable(Pt. is at out of his romm at Hemodialysis.)  Harrel Carina, MS, OTR/L 11/29/2018, 1:29 PM

## 2018-11-30 LAB — CBC
HCT: 33.5 % — ABNORMAL LOW (ref 39.0–52.0)
Hemoglobin: 10.1 g/dL — ABNORMAL LOW (ref 13.0–17.0)
MCH: 27 pg (ref 26.0–34.0)
MCHC: 30.1 g/dL (ref 30.0–36.0)
MCV: 89.6 fL (ref 80.0–100.0)
Platelets: 444 10*3/uL — ABNORMAL HIGH (ref 150–400)
RBC: 3.74 MIL/uL — ABNORMAL LOW (ref 4.22–5.81)
RDW: 16.7 % — ABNORMAL HIGH (ref 11.5–15.5)
WBC: 10.8 10*3/uL — ABNORMAL HIGH (ref 4.0–10.5)
nRBC: 0 % (ref 0.0–0.2)

## 2018-11-30 LAB — GLUCOSE, CAPILLARY
Glucose-Capillary: 123 mg/dL — ABNORMAL HIGH (ref 70–99)
Glucose-Capillary: 161 mg/dL — ABNORMAL HIGH (ref 70–99)
Glucose-Capillary: 184 mg/dL — ABNORMAL HIGH (ref 70–99)
Glucose-Capillary: 202 mg/dL — ABNORMAL HIGH (ref 70–99)

## 2018-11-30 MED ORDER — LINEZOLID 600 MG PO TABS: 600.0000 mg | ORAL_TABLET | Freq: Two times a day (BID) | ORAL | 0 refills | Status: DC

## 2018-11-30 NOTE — Treatment Plan (Signed)
Diagnosis: Rt foot osteomyelitis and foot wound  Baseline Creatinine : CKD on dialysis Culture Result: MRSA, pseudomonas and enterococcus  Allergies  Allergen Reactions  . Other Other (See Comments)    Cardiac Problems. Pt states he tolerates Toradol. Due to kidney and heart problems per pt  . Ibuprofen Other (See Comments)    Heart problems  . Baclofen Other (See Comments)  . Metformin Diarrhea  . Nsaids     Due to kidney and heart problems per pt    OPAT Orders Discharge antibiotics: Iv ceftazidime 1 gram during dialysis on Tue/Thursday and Saturday  End Date: 12/28/18  + linezolid 660m PO BID until 12/28/18  Labs weekly while on IV antibiotics: _X_ CBC with differential  _X_ CMP _X_ CRP _X_ ESR   Fax weekly labs to [phone] Clinic Follow Up Appt:with Dr.Freemon Binford in 3 weeks  Call [phone] make appt

## 2018-11-30 NOTE — Consult Note (Signed)
PHARMACY CONSULT NOTE FOR:  OUTPATIENT  PARENTERAL ANTIBIOTIC THERAPY (OPAT)  Indication: Right foot osteomyelitis and foot wound   Regimen:  -IV Ceftazidime 1 gram during dialysis on Tue/Thursday and Saturday -Linezolid 600mg  PO BID   End date: 12/28/18 (for both medications)  IV antibiotic discharge orders are pended. To discharging provider:  please sign these orders via discharge navigator,  Select New Orders & click on the button choice - Manage This Unsigned Work.     Thank you for allowing pharmacy to be a part of this patient's care.  Paul [name] 11/30/2018, 10:07 AM

## 2018-11-30 NOTE — Plan of Care (Signed)

## 2018-11-30 NOTE — Progress Notes (Signed)
[facility] Podiatry                                                      Patient Demographics  Paul [name], is a 53 y.o. male   MRN: [id number]   DOB - 1966/01/09  Admit Date - 11/21/2018    Outpatient Primary MD for the patient is Tracie Harrier, MD With History of -  Past Medical History:  Diagnosis Date  . Anemia   . Cardiac defibrillator in place    a. Biotronik LUmax 540 DRT, (ser # [id number]).  . Carotid arterial disease ([place])    a. s/p prior LICA stenting;  b. 07/8099 Carotid U/S: 40-59% bilat ICA stenosis. Patent LICA stent.  . CHF (congestive heart failure) ([place])   . CKD (chronic kidney disease), stage III ([place])   . Coronary artery disease    a. 2010 s/p CABG x 3.  . DDD (degenerative disc disease), lumbosacral    L5-S1  . Diabetes ([place])    Lantus at bedtime  . Gangrene of toe of right foot ([place])   . Gout of left hand 10/06/2016  . HFrEF (heart failure with reduced ejection fraction) ([place])    a. 07/2016 Echo: EF 25-30%, diff HK, mild MR, mildly dil LA, mod reduced RV fxn, PASP 34mmHg.  Marland Kitchen Hypertension    takes Coreg daily  . Ischemic cardiomyopathy    a. 07/2016 Echo: EF 25-30%, diff HK.  . Myocardial infarction ([place]) 2009  . Peripheral vascular disease ([place])   . Sleep apnea    sleep study yr ago-unable to afford cpap  . Stroke [facility]) 09   no weakness      Past Surgical History:  Procedure Laterality Date  . AMPUTATION TOE Right 08/22/2016   Procedure: AMPUTATION TOE;  Surgeon: Samara Deist, DPM;  Location: [facility] ORS;  Service: Podiatry;  Laterality: Right;  . AMPUTATION TOE Right 10/09/2016   Procedure: AMPUTATION TOE-RIGHT 2ND MPJ;  Surgeon: Samara Deist, DPM;  Location: [facility] ORS;  Service: Podiatry;  Laterality: Right;  . APLIGRAFT PLACEMENT Right 10/09/2016   Procedure: APLIGRAFT PLACEMENT;  Surgeon: Samara Deist, DPM;  Location: [facility]  ORS;  Service: Podiatry;  Laterality: Right;  . CALCANEAL OSTEOTOMY Bilateral 07/28/2018   Procedure: RIGHT CALCANECTOMY BILATERAL DEBRIDEMENT OF ULCERS ON HEELS;  Surgeon: Albertine Patricia, DPM;  Location: [facility] ORS;  Service: Podiatry;  Laterality: Bilateral;  . CARDIAC DEFIBRILLATOR PLACEMENT  2011  . CAROTID ENDARTERECTOMY Left   . CHOLECYSTECTOMY  2010  . CORONARY ARTERY BYPASS GRAFT  2010   CABG x 3   . DIALYSIS/PERMA CATHETER INSERTION N/A 10/07/2018   Procedure: DIALYSIS/PERMA CATHETER INSERTION;  Surgeon: Katha Cabal, MD;  Location: [place] [facility];  Service: Cardiovascular;  Laterality: N/A;  . GRAFT APPLICATION Right 7/51/0258   Procedure: FULL THICKNESS SKIN GRAFT-RIGHT FOOT;  Surgeon: Algernon Huxley, MD;  Location: [facility] ORS;  Service: Vascular;  Laterality: Right;  . HIP SURGERY Right 1994  . INCISION AND DRAINAGE Right 09/08/2018   Procedure: INCISION AND DRAINAGE - [place] OF DEFECTIVE SKIN, SOFT TISSUE AND BONE;  Surgeon: Albertine Patricia, DPM;  Location: [facility] ORS;  Service: Podiatry;  Laterality: Right;  . INCISION AND DRAINAGE OF WOUND Right 08/22/2016   Procedure: IRRIGATION AND DEBRIDEMENT WOUND and wound vac placement;  Surgeon: Samara Deist, DPM;  Location: [facility]  ORS;  Service: Podiatry;  Laterality: Right;  . IRRIGATION AND DEBRIDEMENT ABSCESS Right 11/24/2018   Procedure: IRRIGATION AND DEBRIDEMENT ABSCESS RIGHT FOOT, COMPLICATED, DIABETIC;  Surgeon: Albertine Patricia, DPM;  Location: [facility] ORS;  Service: Podiatry;  Laterality: Right;  . LOWER EXTREMITY ANGIOGRAPHY Right 08/24/2016   Procedure: Lower Extremity Angiography;  Surgeon: Algernon Huxley, MD;  Location: [place] [facility];  Service: Cardiovascular;  Laterality: Right;  . LOWER EXTREMITY ANGIOGRAPHY Right 07/27/2018   Procedure: RIGHT Lower Extremity Angiography;  Surgeon: Algernon Huxley, MD;  Location: [place] [facility];  Service: Cardiovascular;  Laterality: Right;  . LOWER EXTREMITY ANGIOGRAPHY Right  09/09/2018   Procedure: Lower Extremity Angiography;  Surgeon: Katha Cabal, MD;  Location: [place] [facility];  Service: Cardiovascular;  Laterality: Right;  . MASS EXCISION Right 07/18/2014   Procedure: EXCISION HETEROTOPIC BONE RIGHT HIP;  Surgeon: Frederik Pear, MD;  Location: [place];  Service: Orthopedics;  Laterality: Right;  . Open Heart Surgery  2010   x 3  . WOUND DEBRIDEMENT Right 10/09/2016   Procedure: DEBRIDEMENT WOUND;  Surgeon: Samara Deist, DPM;  Location: [facility] ORS;  Service: Podiatry;  Laterality: Right;    in for   No chief complaint on file.    HPI  [name]  is a 53 y.o. male,Skin soft tissue and bone right foot6 days status post I&D of infected skin soft tissue and bone from plantar right foot    Review of Systems    In addition to the HPI above,  No Fever-chills, No Headache, No changes with Vision or hearing, No problems swallowing food or Liquids, No Chest pain, Cough or Shortness of Breath, No Abdominal pain, No Nausea or Vommitting, Bowel movements are regular, No Blood in stool or Urine, No dysuria, No new skin rashes or bruises, No new joints pains-aches,  No new weakness, tingling, numbness in any extremity, No recent weight gain or loss, No polyuria, polydypsia or polyphagia, No significant Mental Stressors.  A full 10 point Review of Systems was done, except as stated above, all other Review of Systems were negative.   Social History Social History   Tobacco Use  . Smoking status: Never Smoker  . Smokeless tobacco: Never Used  Substance Use Topics  . Alcohol use: No    Family History Family History  Problem Relation Age of Onset  . Hypertension Other   . Diabetes Other   . Diabetes Father   . Hyperlipidemia Father   . Hypertension Father   . Hyperlipidemia Sister   . Hypertension Sister   . Diabetes Sister   . Diabetes Brother   . Hypertension Brother   . Hyperlipidemia Brother     Prior to Admission medications    Medication Sig Start Date End Date Taking? Authorizing Provider  allopurinol (ZYLOPRIM) 100 MG tablet Take 1 tablet (100 mg total) by mouth daily. 08/02/18  Yes Fritzi Mandes, MD  amiodarone (PACERONE) 100 MG tablet Take 100 mg by mouth daily.  01/15/18  Yes [provider]  aspirin EC 81 MG tablet Take 1 tablet (81 mg total) by mouth daily. 10/21/16  Yes Theora Gianotti, NP  carvedilol (COREG) 3.125 MG tablet Take 1 tablet (3.125 mg total) by mouth 2 (two) times daily. 05/29/18  Yes Loletha Grayer, MD  clopidogrel (PLAVIX) 75 MG tablet Take 1 tablet (75 mg total) by mouth daily with breakfast. 09/12/18  Yes Mayo, Pete Pelt, MD  dicyclomine (BENTYL) 10 MG capsule Take 1 capsule (10  mg total) by mouth 4 (four) times daily -  before meals and at bedtime. 10/11/18  Yes Wieting, [name], MD  gabapentin (NEURONTIN) 100 MG capsule Take 1 capsule (100 mg total) by mouth 3 (three) times daily. 10/11/18  Yes Wieting, [name], MD  loperamide (IMODIUM) 2 MG capsule Take 1 capsule (2 mg total) by mouth every 8 (eight) hours as needed for diarrhea or loose stools. 10/11/18  Yes Wieting, [name], MD  predniSONE (DELTASONE) 5 MG tablet Take 1 tablet (5 mg total) by mouth daily with breakfast. 08/02/18  Yes Fritzi Mandes, MD  torsemide (DEMADEX) 20 MG tablet Take 40 mg (two tablets) on days that you do not have dialysis Patient not taking: Reported on 10/31/2018 10/11/18   Loletha Grayer, MD    Anti-infectives (From admission, onward)   Start     Dose/Rate Route Frequency Ordered Stop   12/01/18 1200  cefTAZidime (FORTAZ) 1 g in sodium chloride 0.9 % 100 mL IVPB     1 g 200 mL/hr over 30 Minutes Intravenous Once per day on Tue Thu Sat 11/29/18 1522     11/30/18 0600  piperacillin-tazobactam (ZOSYN) IVPB 3.375 g  Status:  Discontinued     3.375 g 100 mL/hr over 30 Minutes Intravenous Every 12 hours 11/29/18 2258 11/29/18 2306   11/30/18 0530  piperacillin-tazobactam (ZOSYN) IVPB 3.375 g     3.375 g 12.5  mL/hr over 240 Minutes Intravenous Every 12 hours 11/29/18 2306 12/01/18 1200   11/24/18 1216  vancomycin (VANCOCIN) powder  Status:  Discontinued       As needed 11/24/18 1216 11/24/18 1251   11/24/18 1216  gentamicin (GARAMYCIN) injection  Status:  Discontinued       As needed 11/24/18 1217 11/24/18 1251   11/22/18 1800  piperacillin-tazobactam (ZOSYN) IVPB 3.375 g  Status:  Discontinued     3.375 g 12.5 mL/hr over 240 Minutes Intravenous Every 12 hours 11/22/18 1544 11/29/18 2201   11/22/18 1600  linezolid (ZYVOX) tablet 600 mg     600 mg Oral Every 12 hours 11/22/18 1544     11/22/18 1200  vancomycin (VANCOCIN) IVPB 1000 mg/200 mL premix  Status:  Discontinued     1,000 mg 200 mL/hr over 60 Minutes Intravenous Every T-Th-Sa (Hemodialysis) 11/21/18 1955 11/22/18 1545   11/22/18 1200  ceFEPIme (MAXIPIME) 2 g in sodium chloride 0.9 % 100 mL IVPB  Status:  Discontinued     2 g 200 mL/hr over 30 Minutes Intravenous Once per day on Tue Thu Sat 11/21/18 2007 11/22/18 1541   11/21/18 2000  vancomycin (VANCOCIN) 2,000 mg in sodium chloride 0.9 % 500 mL IVPB  Status:  Discontinued     2,000 mg 250 mL/hr over 120 Minutes Intravenous  Once 11/21/18 1927 11/22/18 1528   11/21/18 1900  ceFEPIme (MAXIPIME) 2 g in sodium chloride 0.9 % 100 mL IVPB     2 g 200 mL/hr over 30 Minutes Intravenous  Once 11/21/18 1856 11/21/18 2355      Scheduled Meds: . allopurinol  100 mg Oral Daily  . amiodarone  100 mg Oral Daily  . aspirin EC  81 mg Oral Daily  . carvedilol  3.125 mg Oral BID  . Chlorhexidine Gluconate Cloth  6 each Topical Q0600  . clopidogrel  75 mg Oral Q breakfast  . dicyclomine  10 mg Oral TID AC & HS  . epoetin (EPOGEN/PROCRIT) injection  10,000 Units Intravenous Q T,Th,Sa-HD  . gabapentin  100 mg Oral TID  .  insulin aspart  0-5 Units Subcutaneous QHS  . insulin aspart  0-9 Units Subcutaneous TID WC  . insulin aspart  2 Units Subcutaneous TID WC  . linezolid  600 mg Oral Q12H  .  polyethylene glycol  17 g Oral Daily  . predniSONE  10 mg Oral Q breakfast   Continuous Infusions: . sodium chloride    . sodium chloride    . [START ON 12/01/2018] cefTAZidime (FORTAZ)  IV    . piperacillin-tazobactam (ZOSYN)  IV 3.375 g (11/30/18 0524)   PRN Meds:.sodium chloride, sodium chloride, alteplase, heparin, HYDROcodone-acetaminophen, lidocaine (PF), lidocaine-prilocaine, loperamide, pentafluoroprop-tetrafluoroeth  Allergies  Allergen Reactions  . Other Other (See Comments)    Cardiac Problems. Pt states he tolerates Toradol. Due to kidney and heart problems per pt  . Ibuprofen Other (See Comments)    Heart problems  . Baclofen Other (See Comments)  . Metformin Diarrhea  . Nsaids     Due to kidney and heart problems per pt    Physical Exam  Vitals  Blood pressure 126/79, pulse 82, temperature 97.7 F (36.5 C), temperature source Oral, resp. rate 18, height 5\' 10"  (1.778 m), weight 92.5 kg, SpO2 100 %.  Lower Extremity exam: Wound VAC was taken down today and the wound was evaluated.  There is more granular tissue.  There is no heavy purulence on the region.  The posterior wound looks fairly good.  Minimal necrotic tissue.  White count is back up to 36.6 uncertain as to the reason for this.  I do not think it is foot however. Data Review  CBC Recent Labs  Lab 11/26/18 0502 11/28/18 0549 11/28/18 2025 11/29/18 0033 11/30/18 0338  WBC 12.7* 10.8* 11.3* 11.2* 10.8*  HGB 9.5* 9.6* 10.5* 9.5* 10.1*  HCT 31.0* 32.0* 35.3* 31.1* 33.5*  PLT 346 422* 450* 438* 444*  MCV 89.6 89.9 91.7 89.9 89.6  MCH 27.5 27.0 27.3 27.5 27.0  MCHC 30.6 30.0 29.7* 30.5 30.1  RDW 16.7* 16.7* 16.9* 16.9* 16.7*   ------------------------------------------------------------------------------------------------------------------  Chemistries  Recent Labs  Lab 11/24/18 1011 11/25/18 0400 11/26/18 0502 11/28/18 2025 11/29/18 0033  NA  --  139 137 134* 136  K 4.3 4.0 4.3 5.3* 5.3*   CL  --  99 99 97* 99  CO2  --  30 26 23 26   GLUCOSE  --  184* 185* 298* 239*  BUN  --  18 32* 48* 51*  CREATININE  --  4.41* 6.12* 6.95* 7.36*  CALCIUM  --  8.2* 8.3* 8.5* 8.5*   -----Assessment & Plan: Overall the area appears to be stable will need periodic debridement in the office but hopefully with a wound VAC will see continued granulation tissue across the region.  He has instructions to completely stay nonweightbearing he understands that that is the case that he cannot put any weight on the foot whatsoever.  I think that things are arranged for him so that he will be able to accomplish this at home with a combination of a wheelchair and a Rollator walker.  His transportation to dialysis also will pick him up at the door now.  Hopefully we can keep you nonweightbearing as I think this is the only chance that we can get this healed.  Cultures of come back from the bone growing similar organisms as the soft tissue deep culture.  These were Pseudomonas MRSA and in the new growth of enterococcus from the bone.  These could have been just contaminants in the wound  however.  Antibiotics as per Dr. Tama High.  Recommend he stay until tomorrow to give his family time to get things organized so that he can safely get in and out of the house.  Active Problems:   Osteomyelitis [facility])   Family Communication: Plan discussed with patient   Albertine Patricia M.D on 11/30/2018 at 12:35 PM  Thank you for the consult, we will follow the patient with you in the Hospital.

## 2018-11-30 NOTE — Progress Notes (Signed)
[facility]  ROUNDING NOTE   Subjective:   Hemodialysis treatment yesterday. Tolerated treatment well. UF of 3 liters.   Objective:  Vital signs in last 24 hours:  Temp:  [97.7 F (36.5 C)-98.6 F (37 C)] 97.7 F (36.5 C) (07/29 0756) Pulse Rate:  [82-87] 82 (07/29 0756) Resp:  [16-25] 18 (07/28 2315) BP: (88-148)/(61-116) 126/79 (07/29 0756) SpO2:  [94 %-100 %] 100 % (07/29 0756)  Weight change:  Filed Weights   11/22/18 0915 11/22/18 1233 11/26/18 1630  Weight: 95 kg 92.5 kg 92.5 kg    Intake/Output: I/O last 3 completed shifts: In: 447.3 [P.O.:240; IV Piggyback:207.3] Out: 3602 [Urine:600; Other:3000; Stool:2]   Intake/Output this shift:  No intake/output data recorded.  Physical Exam: General: No acute distress, laying in bed  Head: Normocephalic, atraumatic. Moist oral mucosal membranes  Eyes: Anicteric  Neck: Supple, trachea midline  Lungs:  Clear to auscultation, normal effort  Heart: regular  Abdomen:  Soft, nontender, bowel sounds present  Extremities: 1+ LE edema, right foot wrapped, wound vac in place  Neurologic: Awake, alert, following commands  Skin: No lesions  Access: IJ PermCath.    Basic Metabolic Panel: Recent Labs  Lab 11/24/18 1011  11/25/18 0400 11/26/18 0502 11/26/18 1600 11/28/18 2025 11/29/18 0033  NA  --   --  139 137  --  134* 136  K 4.3  --  4.0 4.3  --  5.3* 5.3*  CL  --   --  99 99  --  97* 99  CO2  --   --  30 26  --  23 26  GLUCOSE  --   --  184* 185*  --  298* 239*  BUN  --   --  18 32*  --  48* 51*  CREATININE  --   --  4.41* 6.12*  --  6.95* 7.36*  CALCIUM  --    < > 8.2* 8.3*  --  8.5* 8.5*  PHOS  --   --  4.0 5.1* 5.3* 5.9* 5.7*   < > = values in this interval not displayed.    Liver Function Tests: Recent Labs  Lab 11/25/18 0400 11/26/18 0502 11/28/18 2025 11/29/18 0033  ALBUMIN 2.8* 2.8* 3.1* 2.9*   No results for input(s): LIPASE, AMYLASE in the last 168 hours. No results for input(s):  AMMONIA in the last 168 hours.  CBC: Recent Labs  Lab 11/26/18 0502 11/28/18 0549 11/28/18 2025 11/29/18 0033 11/30/18 0338  WBC 12.7* 10.8* 11.3* 11.2* 10.8*  HGB 9.5* 9.6* 10.5* 9.5* 10.1*  HCT 31.0* 32.0* 35.3* 31.1* 33.5*  MCV 89.6 89.9 91.7 89.9 89.6  PLT 346 422* 450* 438* 444*    Cardiac Enzymes: No results for input(s): CKTOTAL, CKMB, CKMBINDEX, TROPONINI in the last 168 hours.  BNP: Invalid input(s): POCBNP  CBG: Recent Labs  Lab 11/29/18 1204 11/29/18 1711 11/29/18 2106 11/30/18 0757 11/30/18 1158  GLUCAP 157* 297* 199* 123* 161*    Microbiology: Results for orders placed or performed during the hospital encounter of 11/21/18  SARS Coronavirus 2 (CEPHEID - Performed in [facility] hospital lab), Hosp Order     Status: None   Collection Time: 11/21/18  6:42 PM   Specimen: Nasopharyngeal Swab  Result Value Ref Range Status   SARS Coronavirus 2 NEGATIVE NEGATIVE Final    Comment: (NOTE) If result is NEGATIVE SARS-CoV-2 target nucleic acids are NOT DETECTED. The SARS-CoV-2 RNA is generally detectable in upper and lower  respiratory specimens during  the acute phase of infection. The lowest  concentration of SARS-CoV-2 viral copies this assay can detect is 250  copies / mL. A negative result does not preclude SARS-CoV-2 infection  and should not be used as the sole basis for treatment or other  patient management decisions.  A negative result may occur with  improper specimen collection / handling, submission of specimen other  than nasopharyngeal swab, presence of viral mutation(s) within the  areas targeted by this assay, and inadequate number of viral copies  (<250 copies / mL). A negative result must be combined with clinical  observations, patient history, and epidemiological information. If result is POSITIVE SARS-CoV-2 target nucleic acids are DETECTED. The SARS-CoV-2 RNA is generally detectable in upper and lower  respiratory specimens dur ing  the acute phase of infection.  Positive  results are indicative of active infection with SARS-CoV-2.  Clinical  correlation with patient history and other diagnostic information is  necessary to determine patient infection status.  Positive results do  not rule out bacterial infection or co-infection with other viruses. If result is PRESUMPTIVE POSTIVE SARS-CoV-2 nucleic acids MAY BE PRESENT.   A presumptive positive result was obtained on the submitted specimen  and confirmed on repeat testing.  While 2019 novel coronavirus  (SARS-CoV-2) nucleic acids may be present in the submitted sample  additional confirmatory testing may be necessary for epidemiological  and / or clinical management purposes  to differentiate between  SARS-CoV-2 and other Sarbecovirus currently known to infect humans.  If clinically indicated additional testing with an alternate test  methodology [phone]) is advised. The SARS-CoV-2 RNA is generally  detectable in upper and lower respiratory sp ecimens during the acute  phase of infection. The expected result is Negative. Fact Sheet for Patients:  StrictlyIdeas.no Fact Sheet for Healthcare Providers: BankingDealers.co.za This test is not yet approved or cleared by the [place] FDA and has been authorized for detection and/or diagnosis of SARS-CoV-2 by FDA under an Emergency Use Authorization (EUA).  This EUA will remain in effect (meaning this test can be used) for the duration of the COVID-19 declaration under Section 564(b)(1) of the Act, 21 U.S.C. section 360bbb-3(b)(1), unless the authorization is terminated or revoked sooner. Performed at [facility], [address] [place], [place] [address]   MRSA PCR Screening     Status: Abnormal   Collection Time: 11/22/18  9:38 AM   Specimen: Nasal Mucosa; Nasopharyngeal  Result Value Ref Range Status   MRSA by PCR POSITIVE (A) NEGATIVE Final     Comment:        The GeneXpert MRSA Assay (FDA approved for NASAL specimens only), is one component of a comprehensive MRSA colonization surveillance program. It is not intended to diagnose MRSA infection nor to guide or monitor treatment for MRSA infections. RESULT CALLED TO, READ BACK BY AND VERIFIED WITH: JASMINE MCLENDON AT 1526 ON 11/22/2018. TIK Performed at [facility], [address] [place], [place] [address]   Aerobic/Anaerobic Culture (surgical/deep wound)     Status: None   Collection Time: 11/22/18  1:50 PM   Specimen: Wound  Result Value Ref Range Status   Specimen Description   Final    WOUND Performed at [facility], [address], [place], [place] [address]    Special Requests   Final    Immunocompromised,RT HEEL Performed at [facility], [address] [place], [place] [address]    Gram Stain   Final    FEW  WBC PRESENT, PREDOMINANTLY PMN FEW GRAM POSITIVE COCCI    Culture   Final    FEW PSEUDOMONAS AERUGINOSA MODERATE METHICILLIN RESISTANT STAPHYLOCOCCUS AUREUS NO ANAEROBES ISOLATED Performed at [facility], [place] [address], [place], [place] [address]    Report Status 11/27/2018 FINAL  Final   Organism ID, Bacteria PSEUDOMONAS AERUGINOSA  Final   Organism ID, Bacteria METHICILLIN RESISTANT STAPHYLOCOCCUS AUREUS  Final      Susceptibility   Methicillin resistant staphylococcus aureus - MIC*    CIPROFLOXACIN >=8 RESISTANT Resistant     ERYTHROMYCIN >=8 RESISTANT Resistant     GENTAMICIN <=0.5 SENSITIVE Sensitive     OXACILLIN >=4 RESISTANT Resistant     TETRACYCLINE <=1 SENSITIVE Sensitive     VANCOMYCIN 1 SENSITIVE Sensitive     TRIMETH/SULFA >=320 RESISTANT Resistant     CLINDAMYCIN <=0.25 SENSITIVE Sensitive     RIFAMPIN <=0.5 SENSITIVE Sensitive     Inducible Clindamycin NEGATIVE Sensitive     * MODERATE METHICILLIN RESISTANT STAPHYLOCOCCUS AUREUS   Pseudomonas aeruginosa - MIC*    CEFTAZIDIME 4  SENSITIVE Sensitive     CIPROFLOXACIN >=4 RESISTANT Resistant     GENTAMICIN <=1 SENSITIVE Sensitive     IMIPENEM 2 SENSITIVE Sensitive     PIP/TAZO 16 SENSITIVE Sensitive     CEFEPIME 8 SENSITIVE Sensitive     * FEW PSEUDOMONAS AERUGINOSA  CULTURE, BLOOD (ROUTINE X 2) w Reflex to ID Panel     Status: None   Collection Time: 11/22/18  7:52 PM   Specimen: BLOOD  Result Value Ref Range Status   Specimen Description BLOOD LEFT ANTECUBITAL  Final   Special Requests   Final    BOTTLES DRAWN AEROBIC AND ANAEROBIC Blood Culture adequate volume   Culture   Final    NO GROWTH 5 DAYS Performed at [facility], [address] [place], [place] [address]    Report Status 11/27/2018 FINAL  Final  CULTURE, BLOOD (ROUTINE X 2) w Reflex to ID Panel     Status: None   Collection Time: 11/22/18  7:52 PM   Specimen: BLOOD  Result Value Ref Range Status   Specimen Description BLOOD BLOOD RIGHT ARM  Final   Special Requests   Final    BOTTLES DRAWN AEROBIC ONLY Blood Culture results may not be optimal due to an inadequate volume of blood received in culture bottles   Culture   Final    NO GROWTH 5 DAYS Performed at [facility], [address] [place], [place] [address]    Report Status 11/27/2018 FINAL  Final  Aerobic/Anaerobic Culture (surgical/deep wound)     Status: None   Collection Time: 11/24/18 12:21 PM   Specimen: [facility] Bone biopsy; Tissue  Result Value Ref Range Status   Specimen Description   Final    HEEL Performed at [facility], [address], [place], [place] [address]    Special Requests   Final    BONE CULTURE RIGHT HEEL Performed at [facility], [place], [place] [address]    Gram Stain NO WBC SEEN FEW GRAM POSITIVE COCCI   Final   Culture   Final    FEW METHICILLIN RESISTANT STAPHYLOCOCCUS AUREUS FEW ENTEROCOCCUS FAECALIS RARE PSEUDOMONAS AERUGINOSA NO ANAEROBES ISOLATED Performed at [facility],  [address], [place], [place] [address]    Report Status 11/29/2018 FINAL  Final   Organism ID, Bacteria METHICILLIN RESISTANT STAPHYLOCOCCUS AUREUS  Final   Organism  ID, Bacteria ENTEROCOCCUS FAECALIS  Final   Organism ID, Bacteria PSEUDOMONAS AERUGINOSA  Final      Susceptibility   Enterococcus faecalis - MIC*    AMPICILLIN <=2 SENSITIVE Sensitive     VANCOMYCIN 1 SENSITIVE Sensitive     GENTAMICIN SYNERGY SENSITIVE Sensitive     * FEW ENTEROCOCCUS FAECALIS   Methicillin resistant staphylococcus aureus - MIC*    CIPROFLOXACIN >=8 RESISTANT Resistant     ERYTHROMYCIN >=8 RESISTANT Resistant     GENTAMICIN <=0.5 SENSITIVE Sensitive     OXACILLIN >=4 RESISTANT Resistant     TETRACYCLINE <=1 SENSITIVE Sensitive     VANCOMYCIN 1 SENSITIVE Sensitive     TRIMETH/SULFA >=320 RESISTANT Resistant     CLINDAMYCIN <=0.25 SENSITIVE Sensitive     RIFAMPIN <=0.5 SENSITIVE Sensitive     Inducible Clindamycin NEGATIVE Sensitive     * FEW METHICILLIN RESISTANT STAPHYLOCOCCUS AUREUS   Pseudomonas aeruginosa - MIC*    CEFTAZIDIME 4 SENSITIVE Sensitive     CIPROFLOXACIN >=4 RESISTANT Resistant     GENTAMICIN <=1 SENSITIVE Sensitive     IMIPENEM 2 SENSITIVE Sensitive     PIP/TAZO 16 SENSITIVE Sensitive     CEFEPIME 4 SENSITIVE Sensitive     * RARE PSEUDOMONAS AERUGINOSA    Coagulation Studies: No results for input(s): LABPROT, INR in the last 72 hours.  Urinalysis: No results for input(s): COLORURINE, LABSPEC, PHURINE, GLUCOSEU, HGBUR, BILIRUBINUR, KETONESUR, PROTEINUR, UROBILINOGEN, NITRITE, LEUKOCYTESUR in the last 72 hours.  Invalid input(s): APPERANCEUR    Imaging: No results found.   Medications:   . sodium chloride    . sodium chloride    . [START ON 12/01/2018] cefTAZidime (FORTAZ)  IV    . piperacillin-tazobactam (ZOSYN)  IV 3.375 g (11/30/18 0524)   . allopurinol  100 mg Oral Daily  . amiodarone  100 mg Oral Daily  . aspirin EC  81 mg Oral Daily  . carvedilol  3.125 mg Oral  BID  . Chlorhexidine Gluconate Cloth  6 each Topical Q0600  . clopidogrel  75 mg Oral Q breakfast  . dicyclomine  10 mg Oral TID AC & HS  . epoetin (EPOGEN/PROCRIT) injection  10,000 Units Intravenous Q T,Th,Sa-HD  . gabapentin  100 mg Oral TID  . insulin aspart  0-5 Units Subcutaneous QHS  . insulin aspart  0-9 Units Subcutaneous TID WC  . insulin aspart  2 Units Subcutaneous TID WC  . linezolid  600 mg Oral Q12H  . polyethylene glycol  17 g Oral Daily  . predniSONE  10 mg Oral Q breakfast   sodium chloride, sodium chloride, alteplase, heparin, HYDROcodone-acetaminophen, lidocaine (PF), lidocaine-prilocaine, loperamide, pentafluoroprop-tetrafluoroeth  Assessment/ Plan:  [name] is a 53 y.o. black male with type 2 diabetes mellitus insulin dependent, diabetic neuropathy, hypertension, coronary artery disease status post CABG, systolic and diastolic congestive heart failure, status post AICD, carotid stenosis, stroke, obstructive sleep apnea, peripheral vascular disease, gout, osteomyelitis of heel  [place]   1.  ESRD on HD: tolerated hemodialysis treatment yesterday.  - Hemodialysis treatment for tomorrow.   2. Hypertension:  - Continue carvedilol.   3. Anemia of chronic kidney disease:   - EPO with HD treatment.   4. Secondary Hyperparathyroidism: not currently on binders.   5. Diabetes mellitus type II with chronic kidney disease: history of poor control.   6. 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 MRSA and pseudomonas growing on cultures - linezolid and ceftazidime  until 8/26 - Appreciate ID, podiatry and vascular input.    LOS: [place] 7/29/20202:12 PM

## 2018-11-30 NOTE — Evaluation (Signed)
Occupational Therapy Evaluation Patient Details Name: Paul [name]. MRN: [id number] DOB: [birth year] Today's Date: 11/30/2018    History of Present Illness 53 y/o male here for non-healing wound to R heel; admited for management of R heel osteomyelitis.  I & D 7/23, wound vac placed.  Medical team has discussed/recommended R BKA, but patient refusing.     Clinical Impression   Pt seen for OT evaluation this date. Prior to hospital admission, pt was independent with AD to support mobility.  Pt lives with non-relatives in a 1 level house which this pt reports is currently undergoing renovations to support accessibility. Currently pt demonstrates impairments in strength, coordination, balance and functional UE use requiring min assist for UB and LB ADL tasks.  Pt would benefit from skilled OT to address noted impairments and functional limitations (see below for any additional details) in order to maximize safety and independence while minimizing falls risk and caregiver burden.  Upon hospital discharge, recommend HHOT to support safety and functional independence within this pt's natural context.     Follow Up Recommendations  Home health OT    Equipment Recommendations  Other (comment)(TBD)    Recommendations for Other Services       Precautions / Restrictions Precautions Precautions: Fall Restrictions Weight Bearing Restrictions: Yes RLE Weight Bearing: Non weight bearing      Mobility Bed Mobility Overal bed mobility: Modified Independent             General bed mobility comments: Pt able to get himself up to EOB w/o assist  Transfers Overall transfer level: Needs assistance Equipment used: Rolling walker (2 wheeled) Transfers: Sit to/from Stand Sit to Stand: Min assist;Min guard         General transfer comment: Pt demonstrated good safety awareness during SPT from BSC to EOB on this date. Able to maintain NWBing through RLE. Reluctant to accept help on this  date, states he likes to do things his own way.    Balance Overall balance assessment: Modified Independent Sitting-balance support: No upper extremity supported;Feet supported(LLE supported.) Sitting balance-Leahy Scale: Good Sitting balance - Comments: Steady reaching within BOS. Able to weight shift to complete peri-care after using BSC.   Standing balance support: Bilateral upper extremity supported Standing balance-Leahy Scale: Poor                             ADL either performed or assessed with clinical judgement   ADL Overall ADL's : Needs assistance/impaired Eating/Feeding: Sitting;Set up;Minimal assistance Eating/Feeding Details (indicate cue type and reason): Pt has difficulty opening small tray items, cutting food, & holding cups. Grooming: Set up;Supervision/safety;Sitting   Upper Body Bathing: Set up;Supervision/ safety;Sitting   Lower Body Bathing: Set up;Minimal assistance;Sitting/lateral leans   Upper Body Dressing : Set up;Minimal assistance Upper Body Dressing Details (indicate cue type and reason): Pt has difficulty with buttons and zippers. States he tends to wear loose fitting T-shirts for ease. Lower Body Dressing: Set up;Minimal assistance;Sit to/from stand;Cueing for safety;Sitting/lateral leans   Toilet Transfer: BSC;Stand-pivot;Supervision/safety;Set up   [place] and Hygiene: Supervision/safety;Set up;Sitting/lateral lean               Vision         Perception     Praxis      Pertinent Vitals/Pain Pain Assessment: 0-10 Pain Score: 7  Pain Descriptors / Indicators: Sore;Discomfort;Guarding Pain Intervention(s): Limited activity within patient's tolerance;Monitored during session;Patient requesting pain meds-RN notified  Hand Dominance Right   Extremity/Trunk Assessment Upper Extremity Assessment Upper Extremity Assessment: RUE deficits/detail(Grossly 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 1st digit on RUE noted to have decreased ROM with IP joint at aprox 90 degrees of flexion with limited AROM. Pt states he regularly drops items like cups, utensils, and cannot write with his RUE the way he used to. Is very interested in AE to support [facility] and hand function. RUE Sensation: decreased proprioception;history of peripheral neuropathy RUE Coordination: decreased fine motor   Lower Extremity Assessment Lower Extremity Assessment: RLE deficits/detail RLE Deficits / Details: RLE NWB, but pt able to fully weight bear through LUE to complete SPT. RLE: Unable to fully assess due to pain RLE Coordination: decreased fine motor;decreased gross motor       Communication Communication Communication: No difficulties   Cognition Arousal/Alertness: Awake/alert Behavior During Therapy: WFL for tasks assessed/performed Overall Cognitive Status: Within Functional Limits for tasks assessed                                     General Comments  Wound vac in place at start/end of session.    Exercises Other Exercises Other Exercises: OT assisted pt back to bed after using BSC on this date. Pt required supervision assist and min VCs for safety, which he reluctantly accepted. Pt states he prefers to do things "his own way". Other Exercises: Pt educated in falls prevention strategies, safe use of AE for LB ADL tasks, AE options for supporting [facility] and hand control. AE catalog left with pt to review. Would benefit from further education in falls prevention strategies and safety awarenss during ADLs.   Shoulder Instructions      Home Living Family/patient expects to be discharged to:: Private residence Living Arrangements: Non-relatives/Friends Available Help at Discharge: Friend(s);Available PRN/intermittently Type of Home: House Home Access: Ramped entrance(Ramp under construction.) Entrance Stairs-Number of  Steps: 3 Entrance Stairs-Rails: Right Home Layout: One level     Bathroom Shower/Tub: Teacher, early years/pre: Standard Bathroom Accessibility: (small step into bathroom.)   Home Equipment: Walker - 2 wheels;Walker - 4 wheels          Prior Functioning/Environment Level of Independence: Independent with assistive device(s)        Comments: Ambulates household distances with 4ww (uses RW in community--keeps walker in car); house too small for manual w/c so pt reports he doesn't have it anymore, home currently being retro-fitted for accessibility.  Pt reports fall 4 months ago.         OT Problem List: Decreased strength;Decreased coordination;Impaired UE functional use;Decreased range of motion;Impaired sensation;Pain;Decreased activity tolerance;Decreased safety awareness;Decreased knowledge of use of DME or AE;Decreased knowledge of precautions;Impaired balance (sitting and/or standing)      OT Treatment/Interventions: Self-care/ADL training;Therapeutic exercise;Therapeutic activities;DME and/or AE instruction;Patient/family education;Balance training    OT Goals(Current goals can be found in the care plan section) Acute Rehab OT Goals Patient Stated Goal: go home and be able to manage OT Goal Formulation: With patient Time For Goal Achievement: 12/14/18 Potential to Achieve Goals: Good ADL Goals Pt Will Perform Eating: with modified independence;sitting(With LRAD PRN for improved safety and functional independence.) Pt Will Perform Grooming: with modified independence;sitting;with adaptive equipment(With LRAD PRN for improved safety and functional independence.) Pt Will Perform Upper Body Dressing: with modified independence;sitting;with adaptive equipment(With LRAD PRN  for improved safety and functional independence.) Pt Will Perform Lower Body Dressing: with adaptive equipment;sit to/from stand;with modified independence(With LRAD PRN for improved safety and  functional independence.)  OT Frequency: Min 1X/week   Barriers to D/C: Inaccessible home environment;Decreased caregiver support          Co-evaluation              AM-PAC OT "6 Clicks" Daily Activity     Outcome Measure Help from another person eating meals?: A Little Help from another person taking care of personal grooming?: A Little Help from another person toileting, which includes using toliet, bedpan, or urinal?: A Little Help from another person bathing (including washing, rinsing, drying)?: A Little Help from another person to put on and taking off regular upper body clothing?: A Little Help from another person to put on and taking off regular lower body clothing?: A Little 6 Click Score: 18   End of Session Equipment Utilized During Treatment: Gait belt  Activity Tolerance: Patient tolerated treatment well Patient left: in bed;with call bell/phone within reach;with bed alarm set  OT Visit Diagnosis: Other abnormalities of gait and mobility (R26.89);History of falling (Z91.81);Pain Pain - Right/Left: Right Pain - part of body: Ankle and joints of foot;Leg                Time: 6659-9357 OT Time Calculation (min): 23 min Charges:  OT General Charges $OT Visit: 1 Visit OT Evaluation $OT Eval Moderate Complexity: 1 Mod OT Treatments $Self Care/Home Management : 8-22 mins  Shara Blazing, M.S., OTR/L Ascom: [phone] 11/30/18, 4:01 PM

## 2018-11-30 NOTE — Progress Notes (Signed)
[place] [facility] [place] NAME: Paul [name]    MR#:  [id number]  DATE OF BIRTH:  07-30-1965  SUBJECTIVE:   Pain is well controlled.   Sitting in bed Afebrile Wound dressing was changed earlier by podiatry REVIEW OF SYSTEMS:  CONSTITUTIONAL: No fever, fatigue or weakness.  EYES: No blurred or double vision.  EARS, NOSE, AND THROAT: No tinnitus or ear pain.  RESPIRATORY: No cough, shortness of breath, wheezing or hemoptysis.  CARDIOVASCULAR: No chest pain, orthopnea, edema.  GASTROINTESTINAL: No nausea, vomiting, diarrhea or abdominal pain.  GENITOURINARY: No dysuria, hematuria.  ENDOCRINE: No polyuria, nocturia,  HEMATOLOGY: No anemia, easy bruising or bleeding SKIN: No rash or lesion. MUSCULOSKELETAL: Right foot pain, bilateral hand deformities.   NEUROLOGIC: No tingling, numbness, weakness.  PSYCHIATRY: No anxiety or depression.   DRUG ALLERGIES:   Allergies  Allergen Reactions  . Other Other (See Comments)    Cardiac Problems. Pt states he tolerates Toradol. Due to kidney and heart problems per pt  . Ibuprofen Other (See Comments)    Heart problems  . Baclofen Other (See Comments)  . Metformin Diarrhea  . Nsaids     Due to kidney and heart problems per pt    VITALS:  Blood pressure 126/79, pulse 82, temperature 97.7 F (36.5 C), temperature source Oral, resp. rate 18, height 5\' 10"  (1.778 m), weight 92.5 kg, SpO2 100 %.  PHYSICAL EXAMINATION:  GENERAL:  53 y.o.-year-old patient sitting up on edge of bed with no acute distress.  EYES: Pupils equal, round, reactive to light and accommodation. No scleral icterus. Extraocular muscles intact.  HEENT: Head atraumatic, normocephalic. Oropharynx and nasopharynx clear.  NECK:  Supple, no jugular venous distention. No thyroid enlargement, no tenderness.  LUNGS: Normal breath sounds bilaterally, no wheezing, rales,rhonchi or crepitation. No use of accessory muscles of respiration.   CARDIOVASCULAR: S1, S2 normal. No murmurs, rubs, or gallops.  ABDOMEN: Soft, nontender, nondistended. Bowel sounds present. No organomegaly or mass.  EXTREMITIES: No pedal edema, cyanosis, or clubbing. 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  Bilateral hand numbness. NEUROLOGIC: Cranial nerves II through XII are intact. Muscle strength 5/5 in all extremities. Sensation intact. Gait not checked.  PSYCHIATRIC: The patient is alert and oriented x 3.  SKIN: +wound vac and dry dressing place over right foot.,  Right foot wrapped in dressing  LABORATORY PANEL:   CBC Recent Labs  Lab 11/30/18 0338  WBC 10.8*  HGB 10.1*  HCT 33.5*  PLT 444*   ------------------------------------------------------------------------------------------------------------------  Chemistries  Recent Labs  Lab 11/29/18 0033  NA 136  K 5.3*  CL 99  CO2 26  GLUCOSE 239*  BUN 51*  CREATININE 7.36*  CALCIUM 8.5*   ------------------------------------------------------------------------------------------------------------------  Cardiac Enzymes No results for input(s): TROPONINI in the last 168 hours. ------------------------------------------------------------------------------------------------------------------  RADIOLOGY:  No results found.  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 -Dressing changes done by podiatry today.  Wound VAC continued. Bone cultures showing Pseudomonas, MRSA, enterococcus Discussed with infectious disease for antibiotic regimen changes -Patient has refused BKA -NWB to right foot  ESRD on HD TTS- stable -Nephrology following Due for hemodialysis again tomorrow  Paroxysmal atrial fibrillation-patient has been in normal sinus rhythm here. 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  Plan is to discharge home with home health services tomorrow once antibiotics and home equipment set up  All the records are reviewed and case discussed with Care Management/Social Worker Management plans discussed with the patient, family and they are in agreement.  CODE STATUS: Full.  TOTAL TIME TAKING CARE OF THIS PATIENT: 35 minutes.   POSSIBLE D/C IN 1 DAYS, DEPENDING ON CLINICAL CONDITION.  Neita Carp M.D on 11/30/2018   Between 7am to 6pm - Pager - [phone]  After 6pm go to [email or web address] - password [email or web address] [facility]  Office  [phone]  CC: Primary care physician; Tracie Harrier, MD  Note: This dictation was prepared with Dragon dictation along with smaller phrase technology. Any transcriptional errors that result from this process are unintentional.

## 2018-12-01 ENCOUNTER — Other Ambulatory Visit: Payer: Self-pay | Admitting: *Deleted

## 2018-12-01 LAB — GLUCOSE, CAPILLARY
Glucose-Capillary: 91 mg/dL (ref 70–99)
Glucose-Capillary: 114 mg/dL — ABNORMAL HIGH (ref 70–99)

## 2018-12-01 MED ORDER — CEFTAZIDIME IV (FOR PTA / DISCHARGE USE ONLY): 1.0000 g | INTRAVENOUS | 0 refills | Status: DC

## 2018-12-01 MED ORDER — SODIUM CHLORIDE 0.9 % IV SOLN: 1.0000 g | INTRAVENOUS | Status: DC

## 2018-12-01 MED ORDER — HYDROCODONE-ACETAMINOPHEN 5-325 MG PO TABS: 1.0000 | ORAL_TABLET | Freq: Four times a day (QID) | ORAL | 0 refills | Status: DC | PRN

## 2018-12-01 MED ORDER — LINEZOLID 600 MG PO TABS: 600.0000 mg | ORAL_TABLET | Freq: Two times a day (BID) | ORAL | 0 refills | Status: DC

## 2018-12-01 MED ORDER — DAPTOMYCIN IV (FOR PTA / DISCHARGE USE ONLY): 750.0000 mg | INTRAVENOUS | 0 refills | Status: DC

## 2018-12-01 NOTE — Progress Notes (Signed)
Discharge instructions given and went over with patient at bedside. Prescription given and reviewed. All questions answered. Patient assisted with dressing. Awaiting antibiotic to be delivered and wound vac exchange. Patient to be discharged home. Madlyn Frankel, RN

## 2018-12-01 NOTE — Progress Notes (Signed)
HD Tx started w/o complication    35/46/56 0642  Vital Signs  Pulse Rate 89  Resp 11  BP 136/85  BP Location Left Arm  BP Method Automatic  Patient Position (if appropriate) Lying  Oxygen Therapy  SpO2 99 %  Pulse Oximetry Type Continuous  During Hemodialysis Assessment  Blood Flow Rate (mL/min) 400 mL/min  Arterial Pressure (mmHg) -180 mmHg  Venous Pressure (mmHg) 140 mmHg  Transmembrane Pressure (mmHg) 60 mmHg  Ultrafiltration Rate (mL/min) 1230 mL/min  Dialysate Flow Rate (mL/min) 600 ml/min  Conductivity: Machine  14.1  HD Safety Checks Performed Yes  Dialysis Fluid Bolus Normal Saline  Bolus Amount (mL) 250 mL  Intra-Hemodialysis Comments Tx initiated

## 2018-12-01 NOTE — TOC Progression Note (Signed)
Transition of Care (TOC) - Progression Note    Patient Details  Name: Paul [name]. MRN: [id number] Date of Birth: [birth year]  Transition of Care [facility]) CM/SW Contact  [name], RN Phone Number: 12/01/2018, 10:11 AM  Clinical Narrative:     Leola Brazil the Dialysis Patient pathways Nurse, notified her of the need for IV ABX during dialysis Notified [name] with [employer] to make sure the Wheelchair gets delivered at home today Notified Corene Cornea with [facility] that he will go today for [facility] services  Expected Discharge Plan: [place] Barriers to Discharge: Continued Medical Work up  Expected Discharge Plan and Services Expected Discharge Plan: [place]   Discharge Planning Services: CM Consult   Living arrangements for the past 2 months: [place]                 DME Arranged: N/A                     Social Determinants of Health (SDOH) Interventions    Readmission Risk Interventions Readmission Risk Prevention Plan 11/25/2018 10/06/2018 09/09/2018  Transportation Screening Complete Complete Complete  PCP or Specialist Appt within 3-5 Days Complete Complete Complete  HRI or Home Care Consult Complete Complete Complete  Social Work Consult for [place] Planning/Counseling Complete Complete Not Complete  SW consult not completed comments - - NA  Palliative Care Screening Not Applicable Not Applicable Not Applicable  Medication Review (RN Care Manager) Complete - -  Some recent data might be hidden

## 2018-12-01 NOTE — Progress Notes (Signed)
HD Tx completed tolerated well Uf goal met    12/01/18 0943  Vital Signs  Pulse Rate 88  Resp 11  BP (!) 102/57  BP Location Left Arm  BP Method Automatic  Patient Position (if appropriate) Lying  Oxygen Therapy  SpO2 100 %  O2 Device Room Air  During Hemodialysis Assessment  HD Safety Checks Performed Yes  KECN 61.1 KECN  Dialysis Fluid Bolus Normal Saline  Bolus Amount (mL) 250 mL  Intra-Hemodialysis Comments Tx completed;Tolerated well

## 2018-12-01 NOTE — Progress Notes (Signed)
Post HD [facility]    12/01/18 0945  Hand-Off documentation  Report given to (Full Name) Elon Alas, RN   Report received from (Full Name) Beatris Ship, RN   Vital Signs  Temp 98.6 F (37 C)  Temp Source Oral  Pulse Rate 88  Pulse Rate Source Monitor  Resp 18  BP 95/63  BP Location Left Arm  BP Method Automatic  Patient Position (if appropriate) Lying  Oxygen Therapy  SpO2 100 %  O2 Device Room Air  Pain Assessment  Pain Scale 0-10  Pain Score 0  Dialysis Weight  Weight 90 kg  Type of Weight Post-Dialysis  Post-Hemodialysis Assessment  Rinseback Volume (mL) 250 mL  KECN 61.1 V  Dialyzer Clearance Lightly streaked  Duration of HD Treatment -hour(s) 3 hour(s)  Hemodialysis Intake (mL) 500 mL  UF Total -Machine (mL) 3500 mL  Net UF (mL) 3000 mL  Hemodialysis Catheter Right Subclavian Double lumen Permanent (Tunneled)  Placement Date: (c) 11/24/18   Orientation: Right  Access Location: Subclavian  Hemodialysis Catheter Type: Double lumen Permanent (Tunneled)  Site Condition No complications  Blue Lumen Status Heparin locked  Red Lumen Status Heparin locked  Post treatment catheter status Capped and Clamped

## 2018-12-01 NOTE — Patient Outreach (Signed)
[place] [facility]) Care Management [place] Telephone Outreach Care Coordination  12/01/2018  Ramzey Petrovic 1965/06/13 [id number]  [place] Telephone Outreach Care Coordination re:  Rito Lecomte, 53 y/o male referred to [place] by [facility] Liaison RN CM after recent hospitalization June 2-9, 2020 for CKD with newly initiated hemodialysis (Tues/ Thurs/ Saturday). Patient was discharged home to self-care with home health services in place. Patient has history including, but not limited to, HTN, CAD, CHF, Diabetes (controlled, A1C - 7.9), and osteomyelitis.   Unfortunately, patient experienced second hospital admission 41 days after his last hospital discharge, on November 21, 2018 for ongoing osteomyelitis of his (R) heel/ foot; patient refused (R) BKA and surgical I/D of stage IV necrotic ulcer with partial bone removal was completed on November 24, 2018.  To date, patient remains hospitalized with apparent plans for hospital discharge later today.  12:00/ 12:05 pm:  Contacted Lianne Cure [phone]) IP RN CM for update on patient's discharge disposition, as previous IP notes indicated that patient would be discharged to SNF for rehabilitation post-hospital discharge, but subsequent notes indicated that he would be discharging to his home.  Delilah updated today:  -- patient did not qualify for SNF rehabilitation through Medicare- no SNF days approved despite request for waiver -- patient will be returning to his home (boarding house) post-hospital discharge with home health services in place through [facility] for RN three times per week, as well as OT/ PT/ bath aide -- patient reported to IP CM team that a ramp has been built at his home while he has been hospitalized -- wheel chair has been ordered for patient while he is hospitalized; patient will also have wound vac in place post-hospital discharge -- IP CM team has provided patient with forms  to complete for application for medicaid, which he has reported he did not previously qualify for  -- Hospital discharging MD waiting on Infectious disease input around post-hospital discharge IV antibiotic therapy, which patient will receive during his established outpatient hemodialysis sessions/ center will adminster -- patient will continue attending hemodialysis as outpatient as he did prior to hospital admission: T/ Th/ Sat  Plan:  [place] outreach to resume with patient post-hospital discharge, which is planned for later today  Oneta Rack, RN, BSN, [facility] Coordinator [facility] Care Management  [phone]

## 2018-12-01 NOTE — TOC Progression Note (Signed)
Transition of Care (TOC) - Progression Note    Patient Details  Name: Paul [name]. MRN: [id number] Date of Birth: [birth year]  Transition of Care [facility]) CM/SW Contact  [name], RN Phone Number: 12/01/2018, 3:00 PM  Clinical Narrative:    Pharmacy is providing the antibiotic for the patient to go home with so that he will have it until Tuesday when it will be given at dialysis.  I explained it will take just a few min for the pharmacy to pick it up and to bring it to him.  He stated that he does not want to wait.  I explained it would be here in just a few minutes [employer] is providing a wound vac to test for leaks and then will disconnect until her gets home and he can connect to the one he has at home. He is unable to take a second wound vac from the hospital due to already having one and insurance will not cover two. All of this has been explained the the patient, he does not want to wait for anything.  He is getting upset about having to wait at all.  I explained that everyone is trying to get everything for him as quickly as possible so that he can discharge as he wants to.    Expected Discharge Plan: Skilled Nursing Facility Barriers to Discharge: Continued Medical Work up  Expected Discharge Plan and Services Expected Discharge Plan: [place]   Discharge Planning Services: CM Consult   Living arrangements for the past 2 months: [place] Expected Discharge Date: 12/01/18               DME Arranged: N/A                     Social Determinants of Health (SDOH) Interventions    Readmission Risk Interventions Readmission Risk Prevention Plan 11/25/2018 10/06/2018 09/09/2018  Transportation Screening Complete Complete Complete  PCP or Specialist Appt within 3-5 Days Complete Complete Complete  HRI or Home Care Consult Complete Complete Complete  Social Work Consult for [place] Planning/Counseling Complete Complete Not  Complete  SW consult not completed comments - - NA  Palliative Care Screening Not Applicable Not Applicable Not Applicable  Medication Review (RN Care Manager) Complete - -  Some recent data might be hidden

## 2018-12-01 NOTE — Care Management Important Message (Signed)
Important Message  Patient Details  Name: Paul [name]. MRN: [id number] Date of Birth: 09-12-1965   Medicare Important Message Given:  Yes     [name], RN 12/01/2018, 11:34 AM

## 2018-12-01 NOTE — TOC Progression Note (Signed)
Transition of Care (TOC) - Progression Note    Patient Details  Name: Paul [name]. MRN: [id number] Date of Birth: December 17, 1965  Transition of Care [facility]) CM/SW Contact  [name], RN Phone Number: 12/01/2018, 3:27 PM  Clinical Narrative:    [name] with Pharmacy was able to pick up the medication from employee pharmacy to provide to the patient and she took it to the patient's room and gave to him personally.  [name] with [employer] brought up the dressing supplies and hook up supplies to hook to the Wound Vac that the patient has at home.  He is going to allow the dressing to be changed by the bedside nurse before discharging   Expected Discharge Plan: [place] Barriers to Discharge: Continued Medical Work up  Expected Discharge Plan and Services Expected Discharge Plan: [name]   Discharge Planning Services: CM Consult   Living arrangements for the past 2 months: [place] Expected Discharge Date: 12/01/18               DME Arranged: N/A                     Social Determinants of Health (SDOH) Interventions    Readmission Risk Interventions Readmission Risk Prevention Plan 11/25/2018 10/06/2018 09/09/2018  Transportation Screening Complete Complete Complete  PCP or Specialist Appt within 3-5 Days Complete Complete Complete  HRI or Home Care Consult Complete Complete Complete  Social Work Consult for [place] Planning/Counseling Complete Complete Not Complete  SW consult not completed comments - - NA  Palliative Care Screening Not Applicable Not Applicable Not Applicable  Medication Review (RN Care Manager) Complete - -  Some recent data might be hidden

## 2018-12-01 NOTE — Progress Notes (Signed)
[facility]  ROUNDING NOTE   Subjective:   Seen and examined on hemodialysis treatment. Tolerating treatment well. UF goal of 3 liters.     HEMODIALYSIS FLOWSHEET:  Blood Flow Rate (mL/min): 400 mL/min Arterial Pressure (mmHg): -180 mmHg Venous Pressure (mmHg): 140 mmHg Transmembrane Pressure (mmHg): 70 mmHg Ultrafiltration Rate (mL/min): 1150 mL/min Dialysate Flow Rate (mL/min): 600 ml/min Conductivity: Machine : 14 Conductivity: Machine : 14 Dialysis Fluid Bolus: Normal Saline Bolus Amount (mL): 250 mL(rinseback) Dialysate Change: 2K    Objective:  Vital signs in last 24 hours:  Temp:  [97.9 F (36.6 C)-98.4 F (36.9 C)] 98.4 F (36.9 C) (07/29 2300) Pulse Rate:  [85-91] 91 (07/29 2300) Resp:  [18] 18 (07/29 2300) BP: (122-137)/(80-82) 137/82 (07/29 2300) SpO2:  [100 %] 100 % (07/29 2300)  Weight change:  Filed Weights   11/22/18 0915 11/22/18 1233 11/26/18 1630  Weight: 95 kg 92.5 kg 92.5 kg    Intake/Output: I/O last 3 completed shifts: In: 489.9 [P.O.:440; IV Piggyback:49.9] Out: 600 [Urine:600]   Intake/Output this shift:  No intake/output data recorded.  Physical Exam: General: No acute distress, laying in bed  Head: Normocephalic, atraumatic. Moist oral mucosal membranes  Eyes: Anicteric  Neck: Supple, trachea midline  Lungs:  Clear to auscultation, normal effort  Heart: regular  Abdomen:  Soft, nontender, bowel sounds present  Extremities: 1+ LE edema, right foot wrapped, wound vac in place  Neurologic: Awake, alert, following commands  Skin: No lesions  Access: IJ PermCath.    Basic Metabolic Panel: Recent Labs  Lab 11/24/18 1011  11/25/18 0400 11/26/18 0502 11/26/18 1600 11/28/18 2025 11/29/18 0033  NA  --   --  139 137  --  134* 136  K 4.3  --  4.0 4.3  --  5.3* 5.3*  CL  --   --  99 99  --  97* 99  CO2  --   --  30 26  --  23 26  GLUCOSE  --   --  184* 185*  --  298* 239*  BUN  --   --  18 32*  --  48* 51*   CREATININE  --   --  4.41* 6.12*  --  6.95* 7.36*  CALCIUM  --    < > 8.2* 8.3*  --  8.5* 8.5*  PHOS  --   --  4.0 5.1* 5.3* 5.9* 5.7*   < > = values in this interval not displayed.    Liver Function Tests: Recent Labs  Lab 11/25/18 0400 11/26/18 0502 11/28/18 2025 11/29/18 0033  ALBUMIN 2.8* 2.8* 3.1* 2.9*   No results for input(s): LIPASE, AMYLASE in the last 168 hours. No results for input(s): AMMONIA in the last 168 hours.  CBC: Recent Labs  Lab 11/26/18 0502 11/28/18 0549 11/28/18 2025 11/29/18 0033 11/30/18 0338  WBC 12.7* 10.8* 11.3* 11.2* 10.8*  HGB 9.5* 9.6* 10.5* 9.5* 10.1*  HCT 31.0* 32.0* 35.3* 31.1* 33.5*  MCV 89.6 89.9 91.7 89.9 89.6  PLT 346 422* 450* 438* 444*    Cardiac Enzymes: No results for input(s): CKTOTAL, CKMB, CKMBINDEX, TROPONINI in the last 168 hours.  BNP: Invalid input(s): POCBNP  CBG: Recent Labs  Lab 11/29/18 2106 11/30/18 0757 11/30/18 1158 11/30/18 1628 11/30/18 2122  GLUCAP 199* 123* 161* 184* 202*    Microbiology: Results for orders placed or performed during the hospital encounter of 11/21/18  SARS Coronavirus 2 (CEPHEID - Performed in [facility] hospital lab), [facility]  Status: None   Collection Time: 11/21/18  6:42 PM   Specimen: Nasopharyngeal Swab  Result Value Ref Range Status   SARS Coronavirus 2 NEGATIVE NEGATIVE Final    Comment: (NOTE) If result is NEGATIVE SARS-CoV-2 target nucleic acids are NOT DETECTED. The SARS-CoV-2 RNA is generally detectable in upper and lower  respiratory specimens during the acute phase of infection. The lowest  concentration of SARS-CoV-2 viral copies this assay can detect is 250  copies / mL. A negative result does not preclude SARS-CoV-2 infection  and should not be used as the sole basis for treatment or other  patient management decisions.  A negative result may occur with  improper specimen collection / handling, submission of specimen other  than nasopharyngeal  swab, presence of viral mutation(s) within the  areas targeted by this assay, and inadequate number of viral copies  (<250 copies / mL). A negative result must be combined with clinical  observations, patient history, and epidemiological information. If result is POSITIVE SARS-CoV-2 target nucleic acids are DETECTED. The SARS-CoV-2 RNA is generally detectable in upper and lower  respiratory specimens dur ing the acute phase of infection.  Positive  results are indicative of active infection with SARS-CoV-2.  Clinical  correlation with patient history and other diagnostic information is  necessary to determine patient infection status.  Positive results do  not rule out bacterial infection or co-infection with other viruses. If result is PRESUMPTIVE POSTIVE SARS-CoV-2 nucleic acids MAY BE PRESENT.   A presumptive positive result was obtained on the submitted specimen  and confirmed on repeat testing.  While 2019 novel coronavirus  (SARS-CoV-2) nucleic acids may be present in the submitted sample  additional confirmatory testing may be necessary for epidemiological  and / or clinical management purposes  to differentiate between  SARS-CoV-2 and other Sarbecovirus currently known to infect humans.  If clinically indicated additional testing with an alternate test  methodology [phone]) is advised. The SARS-CoV-2 RNA is generally  detectable in upper and lower respiratory sp ecimens during the acute  phase of infection. The expected result is Negative. Fact Sheet for Patients:  StrictlyIdeas.no Fact Sheet for Healthcare Providers: BankingDealers.co.za This test is not yet approved or cleared by the [place] FDA and has been authorized for detection and/or diagnosis of SARS-CoV-2 by FDA under an Emergency Use Authorization (EUA).  This EUA will remain in effect (meaning this test can be used) for the duration of the COVID-19 declaration  under Section 564(b)(1) of the Act, 21 U.S.C. section 360bbb-3(b)(1), unless the authorization is terminated or revoked sooner. Performed at [facility], [address] [place], [place] [address]   MRSA PCR Screening     Status: Abnormal   Collection Time: 11/22/18  9:38 AM   Specimen: Nasal Mucosa; Nasopharyngeal  Result Value Ref Range Status   MRSA by PCR POSITIVE (A) NEGATIVE Final    Comment:        The GeneXpert MRSA Assay (FDA approved for NASAL specimens only), is one component of a comprehensive MRSA colonization surveillance program. It is not intended to diagnose MRSA infection nor to guide or monitor treatment for MRSA infections. RESULT CALLED TO, READ BACK BY AND VERIFIED WITH: JASMINE MCLENDON AT 1526 ON 11/22/2018. TIK Performed at [facility], [address] [place], [place] [address]   Aerobic/Anaerobic Culture (surgical/deep wound)     Status: None   Collection Time: 11/22/18  1:50 PM   Specimen: Wound  Result Value Ref Range Status  Specimen Description   Final    WOUND Performed at [facility], [address], [place], [place] [address]    Special Requests   Final    Immunocompromised,RT HEEL Performed at [facility], [address] [place], [place] [address]    Gram Stain   Final    FEW WBC PRESENT, PREDOMINANTLY PMN FEW GRAM POSITIVE COCCI    Culture   Final    FEW PSEUDOMONAS AERUGINOSA MODERATE METHICILLIN RESISTANT STAPHYLOCOCCUS AUREUS NO ANAEROBES ISOLATED Performed at [facility], [place] [address] [place], [place] [address]    Report Status 11/27/2018 FINAL  Final   Organism ID, Bacteria PSEUDOMONAS AERUGINOSA  Final   Organism ID, Bacteria METHICILLIN RESISTANT STAPHYLOCOCCUS AUREUS  Final      Susceptibility   Methicillin resistant staphylococcus aureus - MIC*    CIPROFLOXACIN >=8 RESISTANT Resistant     ERYTHROMYCIN >=8 RESISTANT Resistant     GENTAMICIN <=0.5 SENSITIVE  Sensitive     OXACILLIN >=4 RESISTANT Resistant     TETRACYCLINE <=1 SENSITIVE Sensitive     VANCOMYCIN 1 SENSITIVE Sensitive     TRIMETH/SULFA >=320 RESISTANT Resistant     CLINDAMYCIN <=0.25 SENSITIVE Sensitive     RIFAMPIN <=0.5 SENSITIVE Sensitive     Inducible Clindamycin NEGATIVE Sensitive     * MODERATE METHICILLIN RESISTANT STAPHYLOCOCCUS AUREUS   Pseudomonas aeruginosa - MIC*    CEFTAZIDIME 4 SENSITIVE Sensitive     CIPROFLOXACIN >=4 RESISTANT Resistant     GENTAMICIN <=1 SENSITIVE Sensitive     IMIPENEM 2 SENSITIVE Sensitive     PIP/TAZO 16 SENSITIVE Sensitive     CEFEPIME 8 SENSITIVE Sensitive     * FEW PSEUDOMONAS AERUGINOSA  CULTURE, BLOOD (ROUTINE X 2) w Reflex to ID Panel     Status: None   Collection Time: 11/22/18  7:52 PM   Specimen: BLOOD  Result Value Ref Range Status   Specimen Description BLOOD LEFT ANTECUBITAL  Final   Special Requests   Final    BOTTLES DRAWN AEROBIC AND ANAEROBIC Blood Culture adequate volume   Culture   Final    NO GROWTH 5 DAYS Performed at [facility], [address] [place], [place] [address]    Report Status 11/27/2018 FINAL  Final  CULTURE, BLOOD (ROUTINE X 2) w Reflex to ID Panel     Status: None   Collection Time: 11/22/18  7:52 PM   Specimen: BLOOD  Result Value Ref Range Status   Specimen Description BLOOD BLOOD RIGHT ARM  Final   Special Requests   Final    BOTTLES DRAWN AEROBIC ONLY Blood Culture results may not be optimal due to an inadequate volume of blood received in culture bottles   Culture   Final    NO GROWTH 5 DAYS Performed at [facility], [address] [place], [place] [address]    Report Status 11/27/2018 FINAL  Final  Aerobic/Anaerobic Culture (surgical/deep wound)     Status: None   Collection Time: 11/24/18 12:21 PM   Specimen: [facility] Bone biopsy; Tissue  Result Value Ref Range Status   Specimen Description   Final    HEEL Performed at [facility], [address], [place], [place] [address]    Special Requests   Final    BONE CULTURE RIGHT HEEL Performed at [facility], [place], [place] [address]    Gram Stain NO WBC SEEN FEW GRAM POSITIVE COCCI   Final  Culture   Final    FEW METHICILLIN RESISTANT STAPHYLOCOCCUS AUREUS FEW ENTEROCOCCUS FAECALIS RARE PSEUDOMONAS AERUGINOSA NO ANAEROBES ISOLATED Performed at [facility], [place] [address] [place], [place] [address]    Report Status 11/29/2018 FINAL  Final   Organism ID, Bacteria METHICILLIN RESISTANT STAPHYLOCOCCUS AUREUS  Final   Organism ID, Bacteria ENTEROCOCCUS FAECALIS  Final   Organism ID, Bacteria PSEUDOMONAS AERUGINOSA  Final      Susceptibility   Enterococcus faecalis - MIC*    AMPICILLIN <=2 SENSITIVE Sensitive     VANCOMYCIN 1 SENSITIVE Sensitive     GENTAMICIN SYNERGY SENSITIVE Sensitive     * FEW ENTEROCOCCUS FAECALIS   Methicillin resistant staphylococcus aureus - MIC*    CIPROFLOXACIN >=8 RESISTANT Resistant     ERYTHROMYCIN >=8 RESISTANT Resistant     GENTAMICIN <=0.5 SENSITIVE Sensitive     OXACILLIN >=4 RESISTANT Resistant     TETRACYCLINE <=1 SENSITIVE Sensitive     VANCOMYCIN 1 SENSITIVE Sensitive     TRIMETH/SULFA >=320 RESISTANT Resistant     CLINDAMYCIN <=0.25 SENSITIVE Sensitive     RIFAMPIN <=0.5 SENSITIVE Sensitive     Inducible Clindamycin NEGATIVE Sensitive     * FEW METHICILLIN RESISTANT STAPHYLOCOCCUS AUREUS   Pseudomonas aeruginosa - MIC*    CEFTAZIDIME 4 SENSITIVE Sensitive     CIPROFLOXACIN >=4 RESISTANT Resistant     GENTAMICIN <=1 SENSITIVE Sensitive     IMIPENEM 2 SENSITIVE Sensitive     PIP/TAZO 16 SENSITIVE Sensitive     CEFEPIME 4 SENSITIVE Sensitive     * RARE PSEUDOMONAS AERUGINOSA    Coagulation Studies: No results for input(s): LABPROT, INR in the last 72 hours.  Urinalysis: No results for input(s): COLORURINE, LABSPEC, PHURINE, GLUCOSEU, HGBUR, BILIRUBINUR, KETONESUR, PROTEINUR, UROBILINOGEN, NITRITE,  LEUKOCYTESUR in the last 72 hours.  Invalid input(s): APPERANCEUR    Imaging: No results found.   Medications:   . sodium chloride    . sodium chloride    . cefTAZidime (FORTAZ)  IV    . piperacillin-tazobactam (ZOSYN)  IV 3.375 g (11/30/18 1707)   . allopurinol  100 mg Oral Daily  . amiodarone  100 mg Oral Daily  . aspirin EC  81 mg Oral Daily  . carvedilol  3.125 mg Oral BID  . Chlorhexidine Gluconate Cloth  6 each Topical Q0600  . clopidogrel  75 mg Oral Q breakfast  . dicyclomine  10 mg Oral TID AC & HS  . epoetin (EPOGEN/PROCRIT) injection  10,000 Units Intravenous Q T,Th,Sa-HD  . gabapentin  100 mg Oral TID  . insulin aspart  0-5 Units Subcutaneous QHS  . insulin aspart  0-9 Units Subcutaneous TID WC  . insulin aspart  2 Units Subcutaneous TID WC  . linezolid  600 mg Oral Q12H  . polyethylene glycol  17 g Oral Daily  . predniSONE  10 mg Oral Q breakfast   sodium chloride, sodium chloride, alteplase, heparin, HYDROcodone-acetaminophen, lidocaine (PF), lidocaine-prilocaine, loperamide, pentafluoroprop-tetrafluoroeth  Assessment/ Plan:  [name] is a 53 y.o. black male with type 2 diabetes mellitus insulin dependent, diabetic neuropathy, hypertension, coronary artery disease status post CABG, systolic and diastolic congestive heart failure, status post AICD, carotid stenosis, stroke, obstructive sleep apnea, peripheral vascular disease, gout, osteomyelitis of heel  [place]   1.  ESRD on HD: seen and examined on hemodialysis treatment.   2. Hypertension:  - Continue carvedilol.   3. Anemia of chronic kidney disease:   -  EPO with HD treatment.   4. Secondary Hyperparathyroidism: not currently on binders.   5. Diabetes mellitus type II with chronic kidney disease: history of poor control.   6. 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 MRSA and pseudomonas growing on cultures - linezolid and ceftazidime  each dialysis treatment until 8/26 as outpatient - Appreciate ID, podiatry and vascular input.    LOS: 10 Wenonah Milo 7/30/20208:54 AM

## 2018-12-01 NOTE — Progress Notes (Signed)
Post HD assessment    12/01/18 0945  Neurological  Level of Consciousness Alert  Orientation Level Oriented X4  Respiratory  Respiratory Pattern Regular;Unlabored  Chest Assessment Chest expansion symmetrical  Bilateral Breath Sounds Clear;Diminished  Cardiac  Pulse Regular  Heart Sounds S1, S2  Jugular Venous Distention (JVD) No  ECG Monitor Yes  Cardiac Rhythm NSR  Vascular  R Radial Pulse +2  L Radial Pulse +2  Edema Generalized  Psychosocial  Psychosocial (WDL) WDL  Patient Behaviors Calm;Cooperative  Needs Expressed Physical  Emotional support given Given to patient

## 2018-12-01 NOTE — Progress Notes (Signed)
Pre HD Assessment    12/01/18 0634  Neurological  Level of Consciousness Alert  Orientation Level Oriented X4  Respiratory  Respiratory Pattern Regular;Unlabored  Chest Assessment Chest expansion symmetrical  Bilateral Breath Sounds Clear;Diminished  Cough None  Cardiac  Pulse Regular  Heart Sounds S1, S2  Jugular Venous Distention (JVD) No  ECG Monitor Yes  Cardiac Rhythm NSR  Vascular  R Radial Pulse +2  L Radial Pulse +2  Edema Generalized  Psychosocial  Psychosocial (WDL) WDL  Patient Behaviors Calm;Cooperative  Needs Expressed Physical  Emotional support given Given to patient

## 2018-12-01 NOTE — Progress Notes (Signed)
Pre HD Tx   12/01/18 3845  Hand-Off documentation  Report given to (Full Name) Beatris Ship, RN   Vital Signs  Temp 98.6 F (37 C)  Temp Source Oral  Pulse Rate 90  Pulse Rate Source Monitor  Resp 19  BP 128/90  BP Location Left Arm  BP Method Automatic  Patient Position (if appropriate) Lying  Oxygen Therapy  SpO2 99 %  O2 Device Room Air  Pulse Oximetry Type Continuous  Pain Assessment  Pain Scale 0-10  Pain Score 0  Dialysis Weight  Weight 93 kg  Type of Weight Pre-Dialysis  Time-Out for Hemodialysis  What Procedure? HD  Pt Identifiers(min of two) First/Last Name;MRN/Account#  Correct Site? Yes  Correct Side? Yes  Correct Procedure? Yes  Consents Verified? Yes  Rad Studies Available? N/A  Safety Precautions Reviewed? Yes  Engineer, civil (consulting) Number 2  Station Number 1  UF/Alarm Test Passed  Conductivity: Meter 14  Conductivity: Machine  14.1  pH 7.4  Reverse Osmosis Main  Normal Saline Lot Number [id number]  Dialyzer Lot Number [id number]  Disposable Set Lot Number [id number]  Machine Temperature 98.6 F (37 C)  Musician and Audible Yes  Blood Lines Intact and Secured Yes  Pre Treatment Patient Checks  Vascular access used during treatment Catheter  HD catheter dressing before treatment WDL  Hepatitis B Surface Antigen Results Negative  Date Hepatitis B Surface Antigen Drawn 11/22/18  Hepatitis B Surface Antibody  (<10)  Hemodialysis Consent Verified Yes  Hemodialysis Standing Orders Initiated Yes  ECG (Telemetry) Monitor On Yes  Prime Ordered Normal Saline  Length of  DialysisTreatment -hour(s) 3 Hour(s)  Dialysis Treatment Comments Na 140  Dialyzer Elisio 17H NR  Dialysate 3K;2.5 Ca  Dialysis Anticoagulant None  Dialysate Flow Ordered 600  Blood Flow Rate Ordered 400 mL/min  Ultrafiltration Goal 3 Liters  Dialysis Blood Pressure Support Ordered Normal Saline  Education / Care Plan  Dialysis Education Provided Yes  Documented Education  in Care Plan Yes  Hemodialysis Catheter Right Subclavian Double lumen Permanent (Tunneled)  Placement Date: (c) 11/24/18   Orientation: Right  Access Location: Subclavian  Hemodialysis Catheter Type: Double lumen Permanent (Tunneled)  Site Condition No complications  Blue Lumen Status Blood return noted  Red Lumen Status Blood return noted  Purple Lumen Status N/A  Dressing Type Biopatch;Occlusive  Dressing Status Clean;Dry;Intact

## 2018-12-01 NOTE — Treatment Plan (Signed)
Diagnosis: MRSA, Pseudomonas, enterococcus osteomyelitis and rt foot wound Baseline Creatinine : ESRD Culture Result: as above  Allergies  Allergen Reactions  . Other Other (See Comments)    Cardiac Problems. Pt states he tolerates Toradol. Due to kidney and heart problems per pt  . Ibuprofen Other (See Comments)    Heart problems  . Baclofen Other (See Comments)  . Metformin Diarrhea  . Nsaids     Due to kidney and heart problems per pt    OPAT Orders Discharge antibiotics: Daptomycin 732m, 7564mand 100036mn Tue /Thursday and Saturday respectively Ceftazidime 1G  /1g//2 g on Tue/Thu/saturday respectively  End Date: 01/03/19   Labs weekly while on IV antibiotics: _X_ CBC with differential  _X_ CMP  _X_ ESR  X CK  Fax weekly labs to ([phone] Follow Up Appt:3 weeks   Call [phone] make appt

## 2018-12-01 NOTE — Progress Notes (Signed)
Off unit to dialysis at assumption of care. Madlyn Frankel, RN

## 2018-12-01 NOTE — Progress Notes (Signed)
Wound vac changed. No leaks identified. Patient discharged. Madlyn Frankel, RN

## 2018-12-01 NOTE — Discharge Instructions (Signed)
Continue dialysis as before.  Your antibiotics will be given at dialysis. You will take 5 days of Zyvox as a pill and then both antibiotics will be thru IV with dialysis

## 2018-12-01 NOTE — Progress Notes (Signed)
PT Cancellation Note  Patient Details Name: Paul [name]. MRN: [id number] DOB: Jul 22, 1965   Cancelled Treatment:    Reason Eval/Treat Not Completed: Medical issues which prohibited therapy   Chart reviewed.  Pt back from dialysis.  Potassium 5.3 this am and BP soft 90/77.  Will hold at this time per therapy protocols and continue as appropriate.   Chesley Noon 12/01/2018, 10:12 AM

## 2018-12-01 NOTE — Progress Notes (Signed)
PHARMACY CONSULT NOTE FOR:  OUTPATIENT  PARENTERAL ANTIBIOTIC THERAPY (OPAT)  Indication: osteomyelitis Regimen: Daptomycin 750mg  IV after HD on TuTH and 1000mg  IV on Sat Ceftazidime 1gm IV with HD on TuTH and 2gm IV on Sat End date: 12/28/2018  IV antibiotic discharge orders are pended. To discharging provider:  please sign these orders via discharge navigator,  Select New Orders & click on the button choice - Manage This Unsigned Work.     Thank you for allowing pharmacy to be a part of this patient's care.  Paul [name] 12/01/2018, 2:13 PM

## 2018-12-02 ENCOUNTER — Other Ambulatory Visit: Payer: Self-pay | Admitting: *Deleted

## 2018-12-02 ENCOUNTER — Encounter: Payer: Self-pay | Admitting: *Deleted

## 2018-12-02 DIAGNOSIS — N186 End stage renal disease: Secondary | ICD-10-CM | POA: Diagnosis not present

## 2018-12-02 DIAGNOSIS — Z992 Dependence on renal dialysis: Secondary | ICD-10-CM | POA: Diagnosis not present

## 2018-12-02 NOTE — Patient Outreach (Signed)
[facility]) [place] Telephone Outreach- Case Closure PCP Office completes Transition of Care follow up post-hospital discharge Post-hospital discharge day # 1  12/02/2018  Helton Oleson. January 25, 1966 [id number]  Successful telephone outreach attempt to Paul [name], 53 y/o male referred to [place] by [facility] Liaison RN CM after recent hospitalization June 2-9, 2020 for CKD with newly initiated hemodialysis (Tues/ Thurs/ Saturday).Patient was discharged home to self-care with home health services in place. Patient has history including, but not limited to,HTN, CAD, CHF, Diabetes (controlled, A1C - 7.9), and osteomyelitis.Noted patienthadrecentsurgery on right foot for wound infection.   Unfortunately, patient experienced second hospital admission 41 days after his last hospital discharge, on November 21, 2018 for ongoing osteomyelitis of his (R) heel/ foot; patient refused (R) BKA and surgical I/D of stage IV necrotic ulcer with partial bone removal was completed on November 24, 2018.  HIPAA/ identity verified with patient during short phone call today; again discussed with patient purpose of call, role of [facility] RN care coordinator.  Patient reports today that he "is doing the best" he can; states he has pain "6/10" in (R) foot secondary to recent surgery; patient further reports that he has not yet picked up his post-hospital discharge pain medication, and when I questioned him around his plan to obtain this medication, he abruptly stated "look, lady- I am not worried about the pain, okay?"  He confirms that he has received a phone call from the home health team, and has scheduled a home visit for Monday 12/05/18.  I then questioned patient about his newly acquired ramp and his ability to get out of his home to attend established hemodialysis sessions; patient reports that the ramp has been built and he "hopes" he will be able to use ramp to  attend hemodialysis session, as the ramp is in the back of his home, and for his transportation services to pick him up, he needs to be located at the front of his home.  I questioned patient if he would have help in getting from the ramp at the back of the house to the front of his house to be picked up/ transported, and patient became extremely angry and said, "look lady- I am not worried about any of it-- we'll just have to see what happens, okay?  I don't feel like talking and I am done talking to you."    Patient then (again) abruptly hung up phone without allowing me to respond further.  3:00 pm:  Contacted [facility] Leadership team to discuss patient case with patient hanging up phone after becoming angry on several previous outreach attempts and then again today.  Advised to close case.  Plan:  Will close patient's [place] CM case and will make patient's PCP aware of same.  Oneta Rack, RN, BSN, [employer] [facility] Care Management  [phone]

## 2018-12-03 NOTE — Discharge Summary (Signed)
Paul [name] NAME: Paul [name]    MR#:  [id number]  DATE OF BIRTH:  1965-10-25  DATE OF ADMISSION:  11/21/2018 ADMITTING PHYSICIAN: Loletha Grayer, MD  DATE OF DISCHARGE: 12/01/2018  3:45 PM  PRIMARY CARE PHYSICIAN: Tracie Harrier, MD   ADMISSION DIAGNOSIS:  Osteomyelitis rt foot  DISCHARGE DIAGNOSIS:  Active Problems:   Osteomyelitis ([place])   SECONDARY DIAGNOSIS:   Past Medical History:  Diagnosis Date  . Anemia   . Cardiac defibrillator in place    a. Biotronik LUmax 540 DRT, (ser # [id number]).  . Carotid arterial disease ([place])    a. s/p prior LICA stenting;  b. 07/7900 Carotid U/S: 40-59% bilat ICA stenosis. Patent LICA stent.  . CHF (congestive heart failure) ([place])   . CKD (chronic kidney disease), stage III ([place])   . Coronary artery disease    a. 2010 s/p CABG x 3.  . DDD (degenerative disc disease), lumbosacral    L5-S1  . Diabetes ([place])    Lantus at bedtime  . Gangrene of toe of right foot ([place])   . Gout of left hand 10/06/2016  . HFrEF (heart failure with reduced ejection fraction) ([place])    a. 07/2016 Echo: EF 25-30%, diff HK, mild MR, mildly dil LA, mod reduced RV fxn, PASP 45mmHg.  Marland Kitchen Hypertension    takes Coreg daily  . Ischemic cardiomyopathy    a. 07/2016 Echo: EF 25-30%, diff HK.  . Myocardial infarction ([place]) 2009  . Peripheral vascular disease ([place])   . Sleep apnea    sleep study yr ago-unable to afford cpap  . Stroke [facility]) 09   no weakness     ADMITTING HISTORY  HISTORY OF PRESENT ILLNESS:  Paul [name]  is a 53 y.o. male with a known history of bilateral heel ulcerations.  The patient states that his left heel was healing well.  But his right heel started getting worse.  He stated he had a work 82 to 40 feet in order to get into the dialysis center and then another 20 feet in order to get into the chair.  He believes that this is part of the issue with his right heel.  He was seen last week by Dr.  Elvina Mattes and he debrided quite a bit and wanted to send him in the hospital at that time but since the patient just bought groceries he did not come into the hospital at that time.  He was reevaluated today and sent in for direct admission for pus coming out of the right heel.   HOSPITAL COURSE:   Necrotic right foot wound with underlying osteomyelitis of the calcaneus -s/p I&D and wound vac placement on 7/24, continue wound VAC. -Dressing changes done by podiatry today.  Wound VAC continued. Bone cultures showing Pseudomonas, MRSA, enterococcus Discussed with infectious disease for antibiotic regimen  -Patient has refused BKA -NWB to right foot Patient will be discharged home on ceftazidime with hemodialysis.  Daptomycin with hemodialysis.  Till 12/28/2018. Daptomycin not available till Tuesday.  He of was given 5 days of oral Zyvox till daptomycin can be started.  ESRD on HD TTS- stable -Nephrology following Hemodialysis in hospital per schedule. Discussed with Dr. Dennison Bulla on day of discharge  Paroxysmal atrial fibrillation-patient has been in normal sinus rhythm here. 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  Patient stable for discharge home.  Home health set up.  Wheelchair delivered. IV antibiotics confirmed with nephrology that they have been set up at dialysis center.  CONSULTS OBTAINED:  Treatment Team:  Albertine Patricia, DPM Anthonette Legato, MD Tsosie Billing, MD  DRUG ALLERGIES:   Allergies  Allergen Reactions  .  Other Other (See Comments)    Cardiac Problems. Pt states he tolerates Toradol. Due to kidney and heart problems per pt  . Ibuprofen Other (See Comments)    Heart problems  . Baclofen Other (See Comments)  . Metformin Diarrhea  . Nsaids     Due to kidney and heart problems per pt    DISCHARGE MEDICATIONS:   Allergies as of 12/01/2018      Reactions   Other Other (See Comments)   Cardiac Problems. Pt states he tolerates Toradol. Due to kidney and heart problems per pt   Ibuprofen Other (See Comments)   Heart problems   Baclofen Other (See Comments)   Metformin Diarrhea   Nsaids    Due to kidney and heart problems per pt      Medication List    TAKE these medications   allopurinol 100 MG tablet Commonly known as: ZYLOPRIM Take 1 tablet (100 mg total) by mouth daily.   amiodarone 100 MG tablet Commonly known as: PACERONE Take 100 mg by mouth daily.   aspirin EC 81 MG tablet Take 1 tablet (81 mg total) by mouth daily.   carvedilol 3.125 MG tablet Commonly known as: COREG Take 1 tablet (3.125 mg total) by mouth 2 (two) times daily.   cefTAZidime  IVPB Commonly known as: FORTAZ Inject 1-2 g into the vein Every Tuesday,Thursday,and Saturday with dialysis for 25 days. Give 1gm with HD on Tuesdays and Thursdays and 2gm on Saturdays Indication:  Osteomyelitis Last Day of Therapy:  12/28/2018 Labs - Once weekly: CK, CBC/D and CMP,   clopidogrel 75 MG tablet Commonly known as: PLAVIX Take 1 tablet (75 mg total) by mouth daily with breakfast.   daptomycin  IVPB Commonly known as: CUBICIN Inject 750-1,000 mg into the vein Every Tuesday,Thursday,and Saturday with dialysis. Give 750mg  after HD on Tuesdays and Thursdays and on Saturdays give 1000mg  Indication:  Osteomyelitis Last Day of Therapy:  12/28/2018 Labs - Once weekly:  CBC/D, CMP, and CPK   dicyclomine 10 MG capsule Commonly known as: BENTYL Take 1 capsule (10 mg total) by mouth 4 (four) times daily -  before  meals and at bedtime.   gabapentin 100 MG capsule Commonly known as: NEURONTIN Take 1 capsule (100 mg total) by mouth 3 (three) times daily.   HYDROcodone-acetaminophen 5-325 MG tablet Commonly known as: NORCO/VICODIN Take 1 tablet by mouth every 6 (six) hours as needed for severe pain. What changed:   when to take this  reasons to take this   linezolid 600 MG tablet Commonly known as: ZYVOX Take 1 tablet (600 mg total) by mouth every 12 (twelve) hours.   loperamide 2 MG capsule Commonly known as: IMODIUM Take 1 capsule (2 mg total) by mouth every 8 (eight) hours as needed for diarrhea or loose stools.   predniSONE 5 MG tablet Commonly known as: DELTASONE Take 1 tablet (5 mg total) by mouth daily with breakfast.  torsemide 20 MG tablet Commonly known as: DEMADEX Take 40 mg (two tablets) on days that you do not have dialysis            Home Infusion Instuctions  (From admission, onward)         Start     Ordered   12/01/18 0000  Home infusion instructions    Question:  Instructions  Answer:  Flushing of vascular access device: 0.9% NaCl pre/post medication administration and prn patency; Heparin 100 u/ml, 15ml for implanted ports and Heparin 10u/ml, 38ml for all other central venous catheters.   12/01/18 1453          Today   VITAL SIGNS:  Blood pressure 95/63, pulse 88, temperature 98.6 F (37 C), temperature source Oral, resp. rate 18, height 5\' 10"  (1.778 m), weight 90 kg, SpO2 100 %.  I/O:  No intake or output data in the 24 hours ending 12/03/18 1456  PHYSICAL EXAMINATION:  Physical Exam  GENERAL:  53 y.o.-year-old patient lying in the bed with no acute distress.  LUNGS: Normal breath sounds bilaterally, no wheezing, rales,rhonchi or crepitation. No use of accessory muscles of respiration.  CARDIOVASCULAR: S1, S2 normal. No murmurs, rubs, or gallops.  ABDOMEN: Soft, non-tender, non-distended. Bowel sounds present. No organomegaly or mass.   NEUROLOGIC: Moves all 4 extremities. PSYCHIATRIC: The patient is alert and oriented x 3.  SKIN: Foot dressing and wound VAC in place  DATA REVIEW:   CBC Recent Labs  Lab 11/30/18 0338  WBC 10.8*  HGB 10.1*  HCT 33.5*  PLT 444*    Chemistries  Recent Labs  Lab 11/29/18 0033  NA 136  K 5.3*  CL 99  CO2 26  GLUCOSE 239*  BUN 51*  CREATININE 7.36*  CALCIUM 8.5*    Cardiac Enzymes No results for input(s): TROPONINI in the last 168 hours.  Microbiology Results  Results for orders placed or performed during the hospital encounter of 11/21/18  SARS Coronavirus 2 (CEPHEID - Performed in [facility] hospital lab), Hosp Order     Status: None   Collection Time: 11/21/18  6:42 PM   Specimen: Nasopharyngeal Swab  Result Value Ref Range Status   SARS Coronavirus 2 NEGATIVE NEGATIVE Final    Comment: (NOTE) If result is NEGATIVE SARS-CoV-2 target nucleic acids are NOT DETECTED. The SARS-CoV-2 RNA is generally detectable in upper and lower  respiratory specimens during the acute phase of infection. The lowest  concentration of SARS-CoV-2 viral copies this assay can detect is 250  copies / mL. A negative result does not preclude SARS-CoV-2 infection  and should not be used as the sole basis for treatment or other  patient management decisions.  A negative result may occur with  improper specimen collection / handling, submission of specimen other  than nasopharyngeal swab, presence of viral mutation(s) within the  areas targeted by this assay, and inadequate number of viral copies  (<250 copies / mL). A negative result must be combined with clinical  observations, patient history, and epidemiological information. If result is POSITIVE SARS-CoV-2 target nucleic acids are DETECTED. The SARS-CoV-2 RNA is generally detectable in upper and lower  respiratory specimens dur ing the acute phase of infection.  Positive  results are indicative of active infection with SARS-CoV-2.   Clinical  correlation with patient history and other diagnostic information is  necessary to determine patient infection status.  Positive results do  not rule out bacterial infection or co-infection with other viruses. If result is  PRESUMPTIVE POSTIVE SARS-CoV-2 nucleic acids MAY BE PRESENT.   A presumptive positive result was obtained on the submitted specimen  and confirmed on repeat testing.  While 2019 novel coronavirus  (SARS-CoV-2) nucleic acids may be present in the submitted sample  additional confirmatory testing may be necessary for epidemiological  and / or clinical management purposes  to differentiate between  SARS-CoV-2 and other Sarbecovirus currently known to infect humans.  If clinically indicated additional testing with an alternate test  methodology [phone]) is advised. The SARS-CoV-2 RNA is generally  detectable in upper and lower respiratory sp ecimens during the acute  phase of infection. The expected result is Negative. Fact Sheet for Patients:  StrictlyIdeas.no Fact Sheet for Healthcare Providers: BankingDealers.co.za This test is not yet approved or cleared by the [place] FDA and has been authorized for detection and/or diagnosis of SARS-CoV-2 by FDA under an Emergency Use Authorization (EUA).  This EUA will remain in effect (meaning this test can be used) for the duration of the COVID-19 declaration under Section 564(b)(1) of the Act, 21 U.S.C. section 360bbb-3(b)(1), unless the authorization is terminated or revoked sooner. Performed at [facility], [name] [place], [place] [address]   MRSA PCR Screening     Status: Abnormal   Collection Time: 11/22/18  9:38 AM   Specimen: Nasal Mucosa; Nasopharyngeal  Result Value Ref Range Status   MRSA by PCR POSITIVE (A) NEGATIVE Final    Comment:        The GeneXpert MRSA Assay (FDA approved for NASAL specimens only), is one component of  a comprehensive MRSA colonization surveillance program. It is not intended to diagnose MRSA infection nor to guide or monitor treatment for MRSA infections. RESULT CALLED TO, READ BACK BY AND VERIFIED WITH: JASMINE MCLENDON AT 1526 ON 11/22/2018. TIK Performed at [facility], [address] [place], [place] [address]   Aerobic/Anaerobic Culture (surgical/deep wound)     Status: None   Collection Time: 11/22/18  1:50 PM   Specimen: Wound  Result Value Ref Range Status   Specimen Description   Final    WOUND Performed at [facility], [address] [place], [place] [address]    Special Requests   Final    Immunocompromised,RT HEEL Performed at [facility], [address] [place], [place] [address]    Gram Stain   Final    FEW WBC PRESENT, PREDOMINANTLY PMN FEW GRAM POSITIVE COCCI    Culture   Final    FEW PSEUDOMONAS AERUGINOSA MODERATE METHICILLIN RESISTANT STAPHYLOCOCCUS AUREUS NO ANAEROBES ISOLATED Performed at [facility], [place] [address], [place], [place] [address]    Report Status 11/27/2018 FINAL  Final   Organism ID, Bacteria PSEUDOMONAS AERUGINOSA  Final   Organism ID, Bacteria METHICILLIN RESISTANT STAPHYLOCOCCUS AUREUS  Final      Susceptibility   Methicillin resistant staphylococcus aureus - MIC*    CIPROFLOXACIN >=8 RESISTANT Resistant     ERYTHROMYCIN >=8 RESISTANT Resistant     GENTAMICIN <=0.5 SENSITIVE Sensitive     OXACILLIN >=4 RESISTANT Resistant     TETRACYCLINE <=1 SENSITIVE Sensitive     VANCOMYCIN 1 SENSITIVE Sensitive     TRIMETH/SULFA >=320 RESISTANT Resistant     CLINDAMYCIN <=0.25 SENSITIVE Sensitive     RIFAMPIN <=0.5 SENSITIVE Sensitive     Inducible Clindamycin NEGATIVE Sensitive     * MODERATE METHICILLIN RESISTANT STAPHYLOCOCCUS AUREUS   Pseudomonas aeruginosa - MIC*    CEFTAZIDIME 4 SENSITIVE Sensitive  CIPROFLOXACIN >=4 RESISTANT Resistant     GENTAMICIN <=1 SENSITIVE Sensitive      IMIPENEM 2 SENSITIVE Sensitive     PIP/TAZO 16 SENSITIVE Sensitive     CEFEPIME 8 SENSITIVE Sensitive     * FEW PSEUDOMONAS AERUGINOSA  CULTURE, BLOOD (ROUTINE X 2) w Reflex to ID Panel     Status: None   Collection Time: 11/22/18  7:52 PM   Specimen: BLOOD  Result Value Ref Range Status   Specimen Description BLOOD LEFT ANTECUBITAL  Final   Special Requests   Final    BOTTLES DRAWN AEROBIC AND ANAEROBIC Blood Culture adequate volume   Culture   Final    NO GROWTH 5 DAYS Performed at [facility], [address] [place], [place] [address]    Report Status 11/27/2018 FINAL  Final  CULTURE, BLOOD (ROUTINE X 2) w Reflex to ID Panel     Status: None   Collection Time: 11/22/18  7:52 PM   Specimen: BLOOD  Result Value Ref Range Status   Specimen Description BLOOD BLOOD RIGHT ARM  Final   Special Requests   Final    BOTTLES DRAWN AEROBIC ONLY Blood Culture results may not be optimal due to an inadequate volume of blood received in culture bottles   Culture   Final    NO GROWTH 5 DAYS Performed at [facility], [address], [place], [place] [address]    Report Status 11/27/2018 FINAL  Final  Aerobic/Anaerobic Culture (surgical/deep wound)     Status: None   Collection Time: 11/24/18 12:21 PM   Specimen: [facility] Bone biopsy; Tissue  Result Value Ref Range Status   Specimen Description   Final    HEEL Performed at [facility], [address] [place], [place] [address]    Special Requests   Final    BONE CULTURE RIGHT HEEL Performed at [facility], [place], [place] [address]    Gram Stain NO WBC SEEN FEW GRAM POSITIVE COCCI   Final   Culture   Final    FEW METHICILLIN RESISTANT STAPHYLOCOCCUS AUREUS FEW ENTEROCOCCUS FAECALIS RARE PSEUDOMONAS AERUGINOSA NO ANAEROBES ISOLATED Performed at [facility], [address], [place], [place] [address]    Report Status 11/29/2018 FINAL  Final   Organism ID, Bacteria  METHICILLIN RESISTANT STAPHYLOCOCCUS AUREUS  Final   Organism ID, Bacteria ENTEROCOCCUS FAECALIS  Final   Organism ID, Bacteria PSEUDOMONAS AERUGINOSA  Final      Susceptibility   Enterococcus faecalis - MIC*    AMPICILLIN <=2 SENSITIVE Sensitive     VANCOMYCIN 1 SENSITIVE Sensitive     GENTAMICIN SYNERGY SENSITIVE Sensitive     * FEW ENTEROCOCCUS FAECALIS   Methicillin resistant staphylococcus aureus - MIC*    CIPROFLOXACIN >=8 RESISTANT Resistant     ERYTHROMYCIN >=8 RESISTANT Resistant     GENTAMICIN <=0.5 SENSITIVE Sensitive     OXACILLIN >=4 RESISTANT Resistant     TETRACYCLINE <=1 SENSITIVE Sensitive     VANCOMYCIN 1 SENSITIVE Sensitive     TRIMETH/SULFA >=320 RESISTANT Resistant     CLINDAMYCIN <=0.25 SENSITIVE Sensitive     RIFAMPIN <=0.5 SENSITIVE Sensitive     Inducible Clindamycin NEGATIVE Sensitive     * FEW METHICILLIN RESISTANT STAPHYLOCOCCUS AUREUS   Pseudomonas aeruginosa - MIC*    CEFTAZIDIME 4 SENSITIVE Sensitive     CIPROFLOXACIN >=4 RESISTANT Resistant     GENTAMICIN <=1 SENSITIVE Sensitive     IMIPENEM 2 SENSITIVE  Sensitive     PIP/TAZO 16 SENSITIVE Sensitive     CEFEPIME 4 SENSITIVE Sensitive     * RARE PSEUDOMONAS AERUGINOSA    RADIOLOGY:  No results found.  Follow up with PCP in 1 week.  Management plans discussed with the patient, family and they are in agreement.  CODE STATUS:  Code Status History    Date Active Date Inactive Code Status Order ID Comments User Context   10/04/2018 1958 10/11/2018 1801 Full Code [id number]  Sela Hua, MD Inpatient   09/06/2018 0259 09/12/2018 2017 Full Code [id number]  Lance Coon, MD Inpatient   07/25/2018 1717 08/02/2018 1919 Full Code [id number]  Dustin Flock, MD Inpatient   05/25/2018 0352 05/29/2018 2022 Full Code [id number]  Hillary Bow, MD ED   10/09/2016 1003 10/09/2016 1340 Full Code [id number]  Samara Deist, [facility] Inpatient   08/22/2016 0042 08/27/2016 1649 Full Code [id number]  Lance Coon, MD Inpatient    07/18/2014 1818 07/20/2014 2002 Full Code [id number]  Leighton Parody, PA-C Inpatient   Advance Care Planning Activity      TOTAL TIME TAKING CARE OF THIS PATIENT ON DAY OF DISCHARGE: more than 30 minutes.   Leia Alf Jerzie Bieri M.D on 12/03/2018 at 2:56 PM  Between 7am to 6pm - Pager - [phone]  After 6pm go to [email or web address] - password [email or web address] [place] [facility]  Office  [phone]  CC: Primary care physician; Tracie Harrier, MD  Note: This dictation was prepared with Dragon dictation along with smaller phrase technology. Any transcriptional errors that result from this process are unintentional.

## 2018-12-05 ENCOUNTER — Other Ambulatory Visit (INDEPENDENT_AMBULATORY_CARE_PROVIDER_SITE_OTHER): Payer: Self-pay | Admitting: Vascular Surgery

## 2018-12-05 DIAGNOSIS — N2581 Secondary hyperparathyroidism of renal origin: Secondary | ICD-10-CM | POA: Diagnosis not present

## 2018-12-05 DIAGNOSIS — M86171 Other acute osteomyelitis, right ankle and foot: Secondary | ICD-10-CM | POA: Diagnosis not present

## 2018-12-05 DIAGNOSIS — I70235 Atherosclerosis of native arteries of right leg with ulceration of other part of foot: Secondary | ICD-10-CM | POA: Diagnosis not present

## 2018-12-05 DIAGNOSIS — L97514 Non-pressure chronic ulcer of other part of right foot with necrosis of bone: Secondary | ICD-10-CM | POA: Diagnosis not present

## 2018-12-05 DIAGNOSIS — I255 Ischemic cardiomyopathy: Secondary | ICD-10-CM | POA: Diagnosis not present

## 2018-12-05 DIAGNOSIS — I2510 Atherosclerotic heart disease of native coronary artery without angina pectoris: Secondary | ICD-10-CM | POA: Diagnosis not present

## 2018-12-05 DIAGNOSIS — E1169 Type 2 diabetes mellitus with other specified complication: Secondary | ICD-10-CM | POA: Diagnosis not present

## 2018-12-05 DIAGNOSIS — M10341 Gout due to renal impairment, right hand: Secondary | ICD-10-CM | POA: Diagnosis not present

## 2018-12-05 DIAGNOSIS — G4730 Sleep apnea, unspecified: Secondary | ICD-10-CM | POA: Diagnosis not present

## 2018-12-05 DIAGNOSIS — N186 End stage renal disease: Secondary | ICD-10-CM | POA: Diagnosis not present

## 2018-12-05 DIAGNOSIS — K529 Noninfective gastroenteritis and colitis, unspecified: Secondary | ICD-10-CM | POA: Diagnosis not present

## 2018-12-05 DIAGNOSIS — Z4800 Encounter for change or removal of nonsurgical wound dressing: Secondary | ICD-10-CM | POA: Diagnosis not present

## 2018-12-05 DIAGNOSIS — M10342 Gout due to renal impairment, left hand: Secondary | ICD-10-CM | POA: Diagnosis not present

## 2018-12-05 DIAGNOSIS — L89614 Pressure ulcer of right heel, stage 4: Secondary | ICD-10-CM | POA: Diagnosis not present

## 2018-12-05 DIAGNOSIS — I480 Paroxysmal atrial fibrillation: Secondary | ICD-10-CM | POA: Diagnosis not present

## 2018-12-05 DIAGNOSIS — I5022 Chronic systolic (congestive) heart failure: Secondary | ICD-10-CM | POA: Diagnosis not present

## 2018-12-05 DIAGNOSIS — E1122 Type 2 diabetes mellitus with diabetic chronic kidney disease: Secondary | ICD-10-CM | POA: Diagnosis not present

## 2018-12-05 DIAGNOSIS — D631 Anemia in chronic kidney disease: Secondary | ICD-10-CM | POA: Diagnosis not present

## 2018-12-05 DIAGNOSIS — I132 Hypertensive heart and chronic kidney disease with heart failure and with stage 5 chronic kidney disease, or end stage renal disease: Secondary | ICD-10-CM | POA: Diagnosis not present

## 2018-12-05 DIAGNOSIS — B952 Enterococcus as the cause of diseases classified elsewhere: Secondary | ICD-10-CM | POA: Diagnosis not present

## 2018-12-05 DIAGNOSIS — E1151 Type 2 diabetes mellitus with diabetic peripheral angiopathy without gangrene: Secondary | ICD-10-CM | POA: Diagnosis not present

## 2018-12-05 DIAGNOSIS — B9562 Methicillin resistant Staphylococcus aureus infection as the cause of diseases classified elsewhere: Secondary | ICD-10-CM | POA: Diagnosis not present

## 2018-12-05 DIAGNOSIS — E1140 Type 2 diabetes mellitus with diabetic neuropathy, unspecified: Secondary | ICD-10-CM | POA: Diagnosis not present

## 2018-12-05 DIAGNOSIS — B965 Pseudomonas (aeruginosa) (mallei) (pseudomallei) as the cause of diseases classified elsewhere: Secondary | ICD-10-CM | POA: Diagnosis not present

## 2018-12-05 DIAGNOSIS — M5010 Cervical disc disorder with radiculopathy, unspecified cervical region: Secondary | ICD-10-CM | POA: Diagnosis not present

## 2018-12-05 DIAGNOSIS — L97419 Non-pressure chronic ulcer of right heel and midfoot with unspecified severity: Secondary | ICD-10-CM

## 2018-12-06 DIAGNOSIS — M86671 Other chronic osteomyelitis, right ankle and foot: Secondary | ICD-10-CM | POA: Diagnosis not present

## 2018-12-06 DIAGNOSIS — D509 Iron deficiency anemia, unspecified: Secondary | ICD-10-CM | POA: Diagnosis not present

## 2018-12-06 DIAGNOSIS — B9689 Other specified bacterial agents as the cause of diseases classified elsewhere: Secondary | ICD-10-CM | POA: Diagnosis not present

## 2018-12-06 DIAGNOSIS — N2581 Secondary hyperparathyroidism of renal origin: Secondary | ICD-10-CM | POA: Diagnosis not present

## 2018-12-06 DIAGNOSIS — E559 Vitamin D deficiency, unspecified: Secondary | ICD-10-CM | POA: Diagnosis not present

## 2018-12-06 DIAGNOSIS — M86171 Other acute osteomyelitis, right ankle and foot: Secondary | ICD-10-CM | POA: Diagnosis not present

## 2018-12-06 DIAGNOSIS — N186 End stage renal disease: Secondary | ICD-10-CM | POA: Diagnosis not present

## 2018-12-06 DIAGNOSIS — D631 Anemia in chronic kidney disease: Secondary | ICD-10-CM | POA: Diagnosis not present

## 2018-12-06 DIAGNOSIS — Z992 Dependence on renal dialysis: Secondary | ICD-10-CM | POA: Diagnosis not present

## 2018-12-08 DIAGNOSIS — N186 End stage renal disease: Secondary | ICD-10-CM | POA: Diagnosis not present

## 2018-12-08 DIAGNOSIS — D509 Iron deficiency anemia, unspecified: Secondary | ICD-10-CM | POA: Diagnosis not present

## 2018-12-08 DIAGNOSIS — M86171 Other acute osteomyelitis, right ankle and foot: Secondary | ICD-10-CM | POA: Diagnosis not present

## 2018-12-08 DIAGNOSIS — M86671 Other chronic osteomyelitis, right ankle and foot: Secondary | ICD-10-CM | POA: Diagnosis not present

## 2018-12-08 DIAGNOSIS — N2581 Secondary hyperparathyroidism of renal origin: Secondary | ICD-10-CM | POA: Diagnosis not present

## 2018-12-08 DIAGNOSIS — E559 Vitamin D deficiency, unspecified: Secondary | ICD-10-CM | POA: Diagnosis not present

## 2018-12-09 ENCOUNTER — Encounter (INDEPENDENT_AMBULATORY_CARE_PROVIDER_SITE_OTHER): Payer: Medicare Other

## 2018-12-09 ENCOUNTER — Ambulatory Visit (INDEPENDENT_AMBULATORY_CARE_PROVIDER_SITE_OTHER): Payer: Medicare Other | Admitting: Nurse Practitioner

## 2018-12-09 DIAGNOSIS — B965 Pseudomonas (aeruginosa) (mallei) (pseudomallei) as the cause of diseases classified elsewhere: Secondary | ICD-10-CM | POA: Diagnosis not present

## 2018-12-09 DIAGNOSIS — B9562 Methicillin resistant Staphylococcus aureus infection as the cause of diseases classified elsewhere: Secondary | ICD-10-CM | POA: Diagnosis not present

## 2018-12-09 DIAGNOSIS — L89614 Pressure ulcer of right heel, stage 4: Secondary | ICD-10-CM | POA: Diagnosis not present

## 2018-12-09 DIAGNOSIS — M86171 Other acute osteomyelitis, right ankle and foot: Secondary | ICD-10-CM | POA: Diagnosis not present

## 2018-12-09 DIAGNOSIS — E1169 Type 2 diabetes mellitus with other specified complication: Secondary | ICD-10-CM | POA: Diagnosis not present

## 2018-12-09 DIAGNOSIS — B952 Enterococcus as the cause of diseases classified elsewhere: Secondary | ICD-10-CM | POA: Diagnosis not present

## 2018-12-13 DIAGNOSIS — N2581 Secondary hyperparathyroidism of renal origin: Secondary | ICD-10-CM | POA: Diagnosis not present

## 2018-12-13 DIAGNOSIS — N186 End stage renal disease: Secondary | ICD-10-CM | POA: Diagnosis not present

## 2018-12-13 DIAGNOSIS — E559 Vitamin D deficiency, unspecified: Secondary | ICD-10-CM | POA: Diagnosis not present

## 2018-12-13 DIAGNOSIS — M86671 Other chronic osteomyelitis, right ankle and foot: Secondary | ICD-10-CM | POA: Diagnosis not present

## 2018-12-13 DIAGNOSIS — D509 Iron deficiency anemia, unspecified: Secondary | ICD-10-CM | POA: Diagnosis not present

## 2018-12-13 DIAGNOSIS — M86171 Other acute osteomyelitis, right ankle and foot: Secondary | ICD-10-CM | POA: Diagnosis not present

## 2018-12-14 ENCOUNTER — Ambulatory Visit: Payer: Medicare Other | Admitting: *Deleted

## 2018-12-14 DIAGNOSIS — M86171 Other acute osteomyelitis, right ankle and foot: Secondary | ICD-10-CM | POA: Diagnosis not present

## 2018-12-14 DIAGNOSIS — E1142 Type 2 diabetes mellitus with diabetic polyneuropathy: Secondary | ICD-10-CM | POA: Diagnosis not present

## 2018-12-14 DIAGNOSIS — L97513 Non-pressure chronic ulcer of other part of right foot with necrosis of muscle: Secondary | ICD-10-CM | POA: Diagnosis not present

## 2018-12-15 DIAGNOSIS — M86671 Other chronic osteomyelitis, right ankle and foot: Secondary | ICD-10-CM | POA: Diagnosis not present

## 2018-12-15 DIAGNOSIS — N2581 Secondary hyperparathyroidism of renal origin: Secondary | ICD-10-CM | POA: Diagnosis not present

## 2018-12-15 DIAGNOSIS — D509 Iron deficiency anemia, unspecified: Secondary | ICD-10-CM | POA: Diagnosis not present

## 2018-12-15 DIAGNOSIS — N186 End stage renal disease: Secondary | ICD-10-CM | POA: Diagnosis not present

## 2018-12-15 DIAGNOSIS — M86171 Other acute osteomyelitis, right ankle and foot: Secondary | ICD-10-CM | POA: Diagnosis not present

## 2018-12-15 DIAGNOSIS — E559 Vitamin D deficiency, unspecified: Secondary | ICD-10-CM | POA: Diagnosis not present

## 2018-12-16 ENCOUNTER — Other Ambulatory Visit: Payer: Self-pay

## 2018-12-16 ENCOUNTER — Ambulatory Visit: Payer: Medicare Other | Admitting: Infectious Diseases

## 2018-12-20 DIAGNOSIS — M86671 Other chronic osteomyelitis, right ankle and foot: Secondary | ICD-10-CM | POA: Diagnosis not present

## 2018-12-20 DIAGNOSIS — D509 Iron deficiency anemia, unspecified: Secondary | ICD-10-CM | POA: Diagnosis not present

## 2018-12-20 DIAGNOSIS — N2581 Secondary hyperparathyroidism of renal origin: Secondary | ICD-10-CM | POA: Diagnosis not present

## 2018-12-20 DIAGNOSIS — M86171 Other acute osteomyelitis, right ankle and foot: Secondary | ICD-10-CM | POA: Diagnosis not present

## 2018-12-20 DIAGNOSIS — E559 Vitamin D deficiency, unspecified: Secondary | ICD-10-CM | POA: Diagnosis not present

## 2018-12-20 DIAGNOSIS — N186 End stage renal disease: Secondary | ICD-10-CM | POA: Diagnosis not present

## 2018-12-21 ENCOUNTER — Inpatient Hospital Stay
Admission: EM | Admit: 2018-12-21 | Discharge: 2019-01-03 | DRG: 617 | Disposition: A | Discharge status: Skilled Nursing Facility | Payer: Medicare Other | Attending: Internal Medicine | Admitting: Internal Medicine

## 2018-12-21 ENCOUNTER — Other Ambulatory Visit: Payer: Self-pay

## 2018-12-21 ENCOUNTER — Encounter: Payer: Self-pay | Admitting: Emergency Medicine

## 2018-12-21 ENCOUNTER — Emergency Department: Payer: Medicare Other

## 2018-12-21 DIAGNOSIS — I255 Ischemic cardiomyopathy: Secondary | ICD-10-CM | POA: Diagnosis present

## 2018-12-21 DIAGNOSIS — Z20828 Contact with and (suspected) exposure to other viral communicable diseases: Secondary | ICD-10-CM | POA: Diagnosis present

## 2018-12-21 DIAGNOSIS — Z833 Family history of diabetes mellitus: Secondary | ICD-10-CM

## 2018-12-21 DIAGNOSIS — G546 Phantom limb syndrome with pain: Secondary | ICD-10-CM | POA: Diagnosis not present

## 2018-12-21 DIAGNOSIS — L089 Local infection of the skin and subcutaneous tissue, unspecified: Secondary | ICD-10-CM

## 2018-12-21 DIAGNOSIS — I5022 Chronic systolic (congestive) heart failure: Secondary | ICD-10-CM | POA: Diagnosis present

## 2018-12-21 DIAGNOSIS — Z03818 Encounter for observation for suspected exposure to other biological agents ruled out: Secondary | ICD-10-CM | POA: Diagnosis not present

## 2018-12-21 DIAGNOSIS — I132 Hypertensive heart and chronic kidney disease with heart failure and with stage 5 chronic kidney disease, or end stage renal disease: Secondary | ICD-10-CM | POA: Diagnosis present

## 2018-12-21 DIAGNOSIS — E1169 Type 2 diabetes mellitus with other specified complication: Secondary | ICD-10-CM | POA: Diagnosis not present

## 2018-12-21 DIAGNOSIS — M2550 Pain in unspecified joint: Secondary | ICD-10-CM | POA: Diagnosis not present

## 2018-12-21 DIAGNOSIS — R5381 Other malaise: Secondary | ICD-10-CM | POA: Diagnosis not present

## 2018-12-21 DIAGNOSIS — M86671 Other chronic osteomyelitis, right ankle and foot: Secondary | ICD-10-CM | POA: Diagnosis not present

## 2018-12-21 DIAGNOSIS — Z79899 Other long term (current) drug therapy: Secondary | ICD-10-CM

## 2018-12-21 DIAGNOSIS — I2510 Atherosclerotic heart disease of native coronary artery without angina pectoris: Secondary | ICD-10-CM | POA: Diagnosis present

## 2018-12-21 DIAGNOSIS — R52 Pain, unspecified: Secondary | ICD-10-CM | POA: Diagnosis not present

## 2018-12-21 DIAGNOSIS — Z8673 Personal history of transient ischemic attack (TIA), and cerebral infarction without residual deficits: Secondary | ICD-10-CM

## 2018-12-21 DIAGNOSIS — I480 Paroxysmal atrial fibrillation: Secondary | ICD-10-CM | POA: Diagnosis present

## 2018-12-21 DIAGNOSIS — Z7902 Long term (current) use of antithrombotics/antiplatelets: Secondary | ICD-10-CM | POA: Diagnosis not present

## 2018-12-21 DIAGNOSIS — Z89421 Acquired absence of other right toe(s): Secondary | ICD-10-CM

## 2018-12-21 DIAGNOSIS — Z992 Dependence on renal dialysis: Secondary | ICD-10-CM | POA: Diagnosis not present

## 2018-12-21 DIAGNOSIS — E1140 Type 2 diabetes mellitus with diabetic neuropathy, unspecified: Secondary | ICD-10-CM | POA: Diagnosis present

## 2018-12-21 DIAGNOSIS — D631 Anemia in chronic kidney disease: Secondary | ICD-10-CM | POA: Diagnosis not present

## 2018-12-21 DIAGNOSIS — S0990XA Unspecified injury of head, initial encounter: Secondary | ICD-10-CM | POA: Diagnosis not present

## 2018-12-21 DIAGNOSIS — D649 Anemia, unspecified: Secondary | ICD-10-CM | POA: Diagnosis not present

## 2018-12-21 DIAGNOSIS — N183 Chronic kidney disease, stage 3 (moderate): Secondary | ICD-10-CM | POA: Diagnosis not present

## 2018-12-21 DIAGNOSIS — E11628 Type 2 diabetes mellitus with other skin complications: Secondary | ICD-10-CM | POA: Diagnosis not present

## 2018-12-21 DIAGNOSIS — L97416 Non-pressure chronic ulcer of right heel and midfoot with bone involvement without evidence of necrosis: Secondary | ICD-10-CM | POA: Diagnosis present

## 2018-12-21 DIAGNOSIS — Z8249 Family history of ischemic heart disease and other diseases of the circulatory system: Secondary | ICD-10-CM

## 2018-12-21 DIAGNOSIS — Z7952 Long term (current) use of systemic steroids: Secondary | ICD-10-CM

## 2018-12-21 DIAGNOSIS — I252 Old myocardial infarction: Secondary | ICD-10-CM | POA: Diagnosis not present

## 2018-12-21 DIAGNOSIS — B965 Pseudomonas (aeruginosa) (mallei) (pseudomallei) as the cause of diseases classified elsewhere: Secondary | ICD-10-CM | POA: Diagnosis not present

## 2018-12-21 DIAGNOSIS — K529 Noninfective gastroenteritis and colitis, unspecified: Secondary | ICD-10-CM | POA: Diagnosis present

## 2018-12-21 DIAGNOSIS — M109 Gout, unspecified: Secondary | ICD-10-CM | POA: Diagnosis present

## 2018-12-21 DIAGNOSIS — I70239 Atherosclerosis of native arteries of right leg with ulceration of unspecified site: Secondary | ICD-10-CM | POA: Diagnosis not present

## 2018-12-21 DIAGNOSIS — E11621 Type 2 diabetes mellitus with foot ulcer: Secondary | ICD-10-CM | POA: Diagnosis present

## 2018-12-21 DIAGNOSIS — L97419 Non-pressure chronic ulcer of right heel and midfoot with unspecified severity: Secondary | ICD-10-CM | POA: Diagnosis not present

## 2018-12-21 DIAGNOSIS — E1122 Type 2 diabetes mellitus with diabetic chronic kidney disease: Secondary | ICD-10-CM | POA: Diagnosis present

## 2018-12-21 DIAGNOSIS — I70261 Atherosclerosis of native arteries of extremities with gangrene, right leg: Secondary | ICD-10-CM | POA: Diagnosis not present

## 2018-12-21 DIAGNOSIS — Z888 Allergy status to other drugs, medicaments and biological substances status: Secondary | ICD-10-CM

## 2018-12-21 DIAGNOSIS — Z9049 Acquired absence of other specified parts of digestive tract: Secondary | ICD-10-CM

## 2018-12-21 DIAGNOSIS — D62 Acute posthemorrhagic anemia: Secondary | ICD-10-CM | POA: Diagnosis not present

## 2018-12-21 DIAGNOSIS — Z7401 Bed confinement status: Secondary | ICD-10-CM | POA: Diagnosis not present

## 2018-12-21 DIAGNOSIS — B952 Enterococcus as the cause of diseases classified elsewhere: Secondary | ICD-10-CM | POA: Diagnosis not present

## 2018-12-21 DIAGNOSIS — M86371 Chronic multifocal osteomyelitis, right ankle and foot: Secondary | ICD-10-CM | POA: Diagnosis not present

## 2018-12-21 DIAGNOSIS — E1152 Type 2 diabetes mellitus with diabetic peripheral angiopathy with gangrene: Secondary | ICD-10-CM | POA: Diagnosis not present

## 2018-12-21 DIAGNOSIS — Z95810 Presence of automatic (implantable) cardiac defibrillator: Secondary | ICD-10-CM

## 2018-12-21 DIAGNOSIS — M5137 Other intervertebral disc degeneration, lumbosacral region: Secondary | ICD-10-CM | POA: Diagnosis present

## 2018-12-21 DIAGNOSIS — Z794 Long term (current) use of insulin: Secondary | ICD-10-CM

## 2018-12-21 DIAGNOSIS — L02611 Cutaneous abscess of right foot: Secondary | ICD-10-CM | POA: Diagnosis not present

## 2018-12-21 DIAGNOSIS — L8995 Pressure ulcer of unspecified site, unstageable: Secondary | ICD-10-CM | POA: Diagnosis not present

## 2018-12-21 DIAGNOSIS — N2581 Secondary hyperparathyroidism of renal origin: Secondary | ICD-10-CM | POA: Diagnosis present

## 2018-12-21 DIAGNOSIS — M869 Osteomyelitis, unspecified: Secondary | ICD-10-CM | POA: Diagnosis not present

## 2018-12-21 DIAGNOSIS — I120 Hypertensive chronic kidney disease with stage 5 chronic kidney disease or end stage renal disease: Secondary | ICD-10-CM | POA: Diagnosis not present

## 2018-12-21 DIAGNOSIS — I96 Gangrene, not elsewhere classified: Secondary | ICD-10-CM | POA: Diagnosis not present

## 2018-12-21 DIAGNOSIS — T8140XA Infection following a procedure, unspecified, initial encounter: Secondary | ICD-10-CM | POA: Diagnosis not present

## 2018-12-21 DIAGNOSIS — T148XXA Other injury of unspecified body region, initial encounter: Secondary | ICD-10-CM

## 2018-12-21 DIAGNOSIS — Z7982 Long term (current) use of aspirin: Secondary | ICD-10-CM

## 2018-12-21 DIAGNOSIS — E1151 Type 2 diabetes mellitus with diabetic peripheral angiopathy without gangrene: Secondary | ICD-10-CM | POA: Diagnosis present

## 2018-12-21 DIAGNOSIS — N186 End stage renal disease: Secondary | ICD-10-CM | POA: Diagnosis present

## 2018-12-21 DIAGNOSIS — I5032 Chronic diastolic (congestive) heart failure: Secondary | ICD-10-CM | POA: Diagnosis not present

## 2018-12-21 DIAGNOSIS — I739 Peripheral vascular disease, unspecified: Secondary | ICD-10-CM | POA: Diagnosis not present

## 2018-12-21 DIAGNOSIS — L97413 Non-pressure chronic ulcer of right heel and midfoot with necrosis of muscle: Secondary | ICD-10-CM | POA: Diagnosis not present

## 2018-12-21 DIAGNOSIS — Z951 Presence of aortocoronary bypass graft: Secondary | ICD-10-CM

## 2018-12-21 DIAGNOSIS — B9562 Methicillin resistant Staphylococcus aureus infection as the cause of diseases classified elsewhere: Secondary | ICD-10-CM | POA: Diagnosis not present

## 2018-12-21 DIAGNOSIS — L97414 Non-pressure chronic ulcer of right heel and midfoot with necrosis of bone: Secondary | ICD-10-CM | POA: Diagnosis not present

## 2018-12-21 DIAGNOSIS — I130 Hypertensive heart and chronic kidney disease with heart failure and stage 1 through stage 4 chronic kidney disease, or unspecified chronic kidney disease: Secondary | ICD-10-CM | POA: Diagnosis not present

## 2018-12-21 LAB — SARS CORONAVIRUS 2 BY RT PCR (HOSPITAL ORDER, PERFORMED IN ~~LOC~~ HOSPITAL LAB): SARS Coronavirus 2: NEGATIVE

## 2018-12-21 LAB — CBC WITH DIFFERENTIAL/PLATELET
Abs Immature Granulocytes: 0.06 10*3/uL (ref 0.00–0.07)
Basophils Absolute: 0 10*3/uL (ref 0.0–0.1)
Basophils Relative: 0 %
Eosinophils Absolute: 0 10*3/uL (ref 0.0–0.5)
Eosinophils Relative: 0 %
HCT: 27.1 % — ABNORMAL LOW (ref 39.0–52.0)
Hemoglobin: 7.7 g/dL — ABNORMAL LOW (ref 13.0–17.0)
Immature Granulocytes: 1 %
Lymphocytes Relative: 7 %
Lymphs Abs: 0.8 10*3/uL (ref 0.7–4.0)
MCH: 26.9 pg (ref 26.0–34.0)
MCHC: 28.4 g/dL — ABNORMAL LOW (ref 30.0–36.0)
MCV: 94.8 fL (ref 80.0–100.0)
Monocytes Absolute: 1 10*3/uL (ref 0.1–1.0)
Monocytes Relative: 9 %
Neutro Abs: 9 10*3/uL — ABNORMAL HIGH (ref 1.7–7.7)
Neutrophils Relative %: 83 %
Platelets: 382 10*3/uL (ref 150–400)
RBC: 2.86 MIL/uL — ABNORMAL LOW (ref 4.22–5.81)
RDW: 18 % — ABNORMAL HIGH (ref 11.5–15.5)
WBC: 10.9 10*3/uL — ABNORMAL HIGH (ref 4.0–10.5)
nRBC: 0 % (ref 0.0–0.2)

## 2018-12-21 LAB — COMPREHENSIVE METABOLIC PANEL
ALT: 11 U/L (ref 0–44)
AST: 16 U/L (ref 15–41)
Albumin: 2.9 g/dL — ABNORMAL LOW (ref 3.5–5.0)
Alkaline Phosphatase: 108 U/L (ref 38–126)
Anion gap: 11 (ref 5–15)
BUN: 28 mg/dL — ABNORMAL HIGH (ref 6–20)
CO2: 22 mmol/L (ref 22–32)
Calcium: 7.9 mg/dL — ABNORMAL LOW (ref 8.9–10.3)
Chloride: 104 mmol/L (ref 98–111)
Creatinine, Ser: 7.16 mg/dL — ABNORMAL HIGH (ref 0.61–1.24)
GFR calc Af Amer: 9 mL/min — ABNORMAL LOW (ref >60)
GFR calc non Af Amer: 8 mL/min — ABNORMAL LOW (ref >60)
Glucose, Bld: 208 mg/dL — ABNORMAL HIGH (ref 70–99)
Potassium: 4.2 mmol/L (ref 3.5–5.1)
Sodium: 137 mmol/L (ref 135–145)
Total Bilirubin: 0.4 mg/dL (ref 0.3–1.2)
Total Protein: 7.1 g/dL (ref 6.5–8.1)

## 2018-12-21 LAB — GLUCOSE, CAPILLARY: Glucose-Capillary: 170 mg/dL — ABNORMAL HIGH (ref 70–99)

## 2018-12-21 MED ORDER — ALLOPURINOL 100 MG PO TABS
100.0000 mg | ORAL_TABLET | Freq: Every day | ORAL | Status: DC
  Administered 2018-12-22 – 2019-01-03 (×10): 100 mg via ORAL
  Filled 2018-12-21 (×13): qty 1

## 2018-12-21 MED ORDER — PREDNISOLONE 5 MG PO TABS: 5.0000 mg | ORAL_TABLET | Freq: Every day | ORAL | Status: DC

## 2018-12-21 MED ORDER — SODIUM CHLORIDE 0.9 % IV SOLN
750.0000 mg | INTRAVENOUS | Status: DC
  Administered 2018-12-22: 750 mg via INTRAVENOUS
  Filled 2018-12-21: qty 15

## 2018-12-21 MED ORDER — CARVEDILOL 3.125 MG PO TABS
3.1250 mg | ORAL_TABLET | Freq: Two times a day (BID) | ORAL | Status: DC
  Administered 2018-12-21 – 2019-01-03 (×22): 3.125 mg via ORAL
  Filled 2018-12-21 (×21): qty 1

## 2018-12-21 MED ORDER — GABAPENTIN 100 MG PO CAPS
100.0000 mg | ORAL_CAPSULE | Freq: Three times a day (TID) | ORAL | Status: DC
  Administered 2018-12-21 – 2019-01-03 (×34): 100 mg via ORAL
  Filled 2018-12-21 (×34): qty 1

## 2018-12-21 MED ORDER — CEFTAZIDIME IV (FOR PTA / DISCHARGE USE ONLY): 1.0000 g | INTRAVENOUS | Status: DC

## 2018-12-21 MED ORDER — SODIUM CHLORIDE 0.9 % IV SOLN
250.0000 mL | INTRAVENOUS | Status: DC | PRN
  Administered 2018-12-23: 15:00:00 via INTRAVENOUS

## 2018-12-21 MED ORDER — ONDANSETRON HCL 4 MG PO TABS: 4.0000 mg | ORAL_TABLET | Freq: Four times a day (QID) | ORAL | Status: DC | PRN

## 2018-12-21 MED ORDER — SODIUM CHLORIDE 0.9 % IV SOLN
1000.0000 mg | INTRAVENOUS | Status: DC
  Administered 2018-12-24: 1000 mg via INTRAVENOUS
  Filled 2018-12-21: qty 20

## 2018-12-21 MED ORDER — LOPERAMIDE HCL 2 MG PO CAPS
2.0000 mg | ORAL_CAPSULE | Freq: Three times a day (TID) | ORAL | Status: DC | PRN
  Filled 2018-12-21: qty 1

## 2018-12-21 MED ORDER — DAPTOMYCIN IV (FOR PTA / DISCHARGE USE ONLY): 750.0000 mg | INTRAVENOUS | Status: DC

## 2018-12-21 MED ORDER — AMIODARONE HCL 200 MG PO TABS
100.0000 mg | ORAL_TABLET | Freq: Every day | ORAL | Status: DC
  Administered 2018-12-22 – 2018-12-30 (×8): 100 mg via ORAL
  Administered 2018-12-31: 15:00:00 via ORAL
  Administered 2019-01-01 – 2019-01-03 (×3): 100 mg via ORAL
  Filled 2018-12-21 (×11): qty 1

## 2018-12-21 MED ORDER — PREDNISONE 10 MG PO TABS
5.0000 mg | ORAL_TABLET | Freq: Every day | ORAL | Status: DC
  Administered 2018-12-22 – 2019-01-03 (×12): 5 mg via ORAL
  Filled 2018-12-21 (×13): qty 1

## 2018-12-21 MED ORDER — ACETAMINOPHEN 650 MG RE SUPP: 650.0000 mg | Freq: Four times a day (QID) | RECTAL | Status: DC | PRN

## 2018-12-21 MED ORDER — CALCIUM ACETATE (PHOS BINDER) 667 MG PO CAPS
1334.0000 mg | ORAL_CAPSULE | Freq: Three times a day (TID) | ORAL | Status: DC
  Administered 2018-12-22 – 2019-01-01 (×22): 1334 mg via ORAL
  Filled 2018-12-21 (×33): qty 2

## 2018-12-21 MED ORDER — SODIUM CHLORIDE 0.9 % IV SOLN: 1.0000 g | INTRAVENOUS | Status: DC

## 2018-12-21 MED ORDER — SODIUM CHLORIDE 0.9% FLUSH
3.0000 mL | INTRAVENOUS | Status: DC | PRN
  Administered 2018-12-27: 3 mL via INTRAVENOUS
  Filled 2018-12-21: qty 3

## 2018-12-21 MED ORDER — ONDANSETRON HCL 4 MG/2ML IJ SOLN: 4.0000 mg | Freq: Four times a day (QID) | INTRAMUSCULAR | Status: DC | PRN

## 2018-12-21 MED ORDER — HEPARIN SODIUM (PORCINE) 5000 UNIT/ML IJ SOLN
5000.0000 [IU] | Freq: Three times a day (TID) | INTRAMUSCULAR | Status: DC
  Administered 2018-12-25 – 2018-12-28 (×3): 5000 [IU] via SUBCUTANEOUS
  Filled 2018-12-21 (×11): qty 1

## 2018-12-21 MED ORDER — DICYCLOMINE HCL 10 MG PO CAPS
10.0000 mg | ORAL_CAPSULE | Freq: Three times a day (TID) | ORAL | Status: DC
  Administered 2018-12-21 – 2019-01-03 (×40): 10 mg via ORAL
  Filled 2018-12-21 (×53): qty 1

## 2018-12-21 MED ORDER — ACETAMINOPHEN 325 MG PO TABS: 650.0000 mg | ORAL_TABLET | Freq: Four times a day (QID) | ORAL | Status: DC | PRN

## 2018-12-21 MED ORDER — HYDROCODONE-ACETAMINOPHEN 5-325 MG PO TABS
1.0000 | ORAL_TABLET | Freq: Four times a day (QID) | ORAL | Status: DC | PRN
  Administered 2018-12-21 – 2018-12-24 (×6): 1 via ORAL
  Filled 2018-12-21 (×6): qty 1

## 2018-12-21 MED ORDER — SODIUM CHLORIDE 0.9 % IV SOLN
1.0000 g | INTRAVENOUS | Status: DC
  Filled 2018-12-21: qty 1

## 2018-12-21 MED ORDER — SODIUM CHLORIDE 0.9 % IV SOLN
2.0000 g | INTRAVENOUS | Status: DC
  Administered 2018-12-24: 2 g via INTRAVENOUS
  Filled 2018-12-21: qty 2

## 2018-12-21 MED ORDER — SODIUM CHLORIDE 0.9% FLUSH
3.0000 mL | Freq: Two times a day (BID) | INTRAVENOUS | Status: DC
  Administered 2018-12-21 – 2019-01-02 (×15): 3 mL via INTRAVENOUS

## 2018-12-21 NOTE — ED Notes (Signed)
Pt is very difficult stick; 2 unsuccessful PIV attempts per this RN.  Will have someone else attempt prior to placing IV team order.  EDP aware.

## 2018-12-21 NOTE — ED Provider Notes (Signed)
[facility] Emergency Department Provider Note    First MD Initiated Contact with Patient 12/21/18 1458     (approximate)  I have reviewed the triage vital signs and the nursing notes.   HISTORY  Chief Complaint Wound Check    HPI Paul [name]. is a 53 y.o. male extensive past medical history as listed below with recent debridement of the right heel by podiatry presents to the ER for evaluation of darkening of the skin around the chronic wound.  Patient's not had any measured temperature.  States he is having worsening right foot pain.  His home health wound nurse came to see it today and said that it was significantly worse was concern for areas of necrosis and sent him to the ER.  Patient states he does get antibiotics with his dialysis sessions which he gets twice weekly.  States that he was supposed to have a wound VAC placed after surgery and they were able to have the wound VAC immediately after surgery but due to some issue with insurance that was removed by the home health agency and he went several days without one.  He then had wound VAC replaced over the weekend.    Past Medical History:  Diagnosis Date  . Anemia   . Cardiac defibrillator in place    a. Biotronik LUmax 540 DRT, (ser # [id number]).  . Carotid arterial disease ([place])    a. s/p prior LICA stenting;  b. 06/2977 Carotid U/S: 40-59% bilat ICA stenosis. Patent LICA stent.  . CHF (congestive heart failure) ([place])   . CKD (chronic kidney disease), stage III ([place])   . Coronary artery disease    a. 2010 s/p CABG x 3.  . DDD (degenerative disc disease), lumbosacral    L5-S1  . Diabetes ([place])    Lantus at bedtime  . Gangrene of toe of right foot ([place])   . Gout of left hand 10/06/2016  . HFrEF (heart failure with reduced ejection fraction) ([place])    a. 07/2016 Echo: EF 25-30%, diff HK, mild MR, mildly dil LA, mod reduced RV fxn, PASP 61mmHg.  Marland Kitchen Hypertension    takes Coreg daily  . Ischemic  cardiomyopathy    a. 07/2016 Echo: EF 25-30%, diff HK.  . Myocardial infarction ([place]) 2009  . Peripheral vascular disease ([place])   . Sleep apnea    sleep study yr ago-unable to afford cpap  . Stroke [facility]) 09   no weakness   Family History  Problem Relation Age of Onset  . Hypertension Other   . Diabetes Other   . Diabetes Father   . Hyperlipidemia Father   . Hypertension Father   . Hyperlipidemia Sister   . Hypertension Sister   . Diabetes Sister   . Diabetes Brother   . Hypertension Brother   . Hyperlipidemia Brother    Past Surgical History:  Procedure Laterality Date  . AMPUTATION TOE Right 08/22/2016   Procedure: AMPUTATION TOE;  Surgeon: Samara Deist, DPM;  Location: [facility];  Service: Podiatry;  Laterality: Right;  . AMPUTATION TOE Right 10/09/2016   Procedure: AMPUTATION TOE-RIGHT 2ND MPJ;  Surgeon: Samara Deist, DPM;  Location: [facility];  Service: Podiatry;  Laterality: Right;  . APLIGRAFT PLACEMENT Right 10/09/2016   Procedure: APLIGRAFT PLACEMENT;  Surgeon: Samara Deist, DPM;  Location: [facility];  Service: Podiatry;  Laterality: Right;  . CALCANEAL OSTEOTOMY Bilateral 07/28/2018   Procedure: RIGHT CALCANECTOMY BILATERAL DEBRIDEMENT OF ULCERS ON HEELS;  Surgeon: Elvina Mattes,  Rodman Key DPM;  Location: [facility];  Service: Podiatry;  Laterality: Bilateral;  . CARDIAC DEFIBRILLATOR PLACEMENT  2011  . CAROTID ENDARTERECTOMY Left   . CHOLECYSTECTOMY  2010  . CORONARY ARTERY BYPASS GRAFT  2010   CABG x 3   . DIALYSIS/PERMA CATHETER INSERTION N/A 10/07/2018   Procedure: DIALYSIS/PERMA CATHETER INSERTION;  Surgeon: Katha Cabal, MD;  Location: [facility];  Service: Cardiovascular;  Laterality: N/A;  . GRAFT APPLICATION Right 1/51/7616   Procedure: FULL THICKNESS SKIN GRAFT-RIGHT FOOT;  Surgeon: Algernon Huxley, MD;  Location: [facility];  Service: Vascular;  Laterality: Right;  . HIP SURGERY Right 1994  . INCISION AND DRAINAGE Right 09/08/2018   Procedure: INCISION AND  DRAINAGE - [place] OF DEFECTIVE SKIN, SOFT TISSUE AND BONE;  Surgeon: Albertine Patricia, DPM;  Location: [facility];  Service: Podiatry;  Laterality: Right;  . INCISION AND DRAINAGE OF WOUND Right 08/22/2016   Procedure: IRRIGATION AND DEBRIDEMENT WOUND and wound vac placement;  Surgeon: Samara Deist, DPM;  Location: [facility];  Service: Podiatry;  Laterality: Right;  . IRRIGATION AND DEBRIDEMENT ABSCESS Right 11/24/2018   Procedure: IRRIGATION AND DEBRIDEMENT ABSCESS RIGHT FOOT, COMPLICATED, DIABETIC;  Surgeon: Albertine Patricia, DPM;  Location: [facility];  Service: Podiatry;  Laterality: Right;  . LOWER EXTREMITY ANGIOGRAPHY Right 08/24/2016   Procedure: Lower Extremity Angiography;  Surgeon: Algernon Huxley, MD;  Location: [facility];  Service: Cardiovascular;  Laterality: Right;  . LOWER EXTREMITY ANGIOGRAPHY Right 07/27/2018   Procedure: RIGHT Lower Extremity Angiography;  Surgeon: Algernon Huxley, MD;  Location: [place];  Service: Cardiovascular;  Laterality: Right;  . LOWER EXTREMITY ANGIOGRAPHY Right 09/09/2018   Procedure: Lower Extremity Angiography;  Surgeon: Katha Cabal, MD;  Location: [place] [facility];  Service: Cardiovascular;  Laterality: Right;  . MASS EXCISION Right 07/18/2014   Procedure: EXCISION HETEROTOPIC BONE RIGHT HIP;  Surgeon: Frederik Pear, MD;  Location: [facility];  Service: Orthopedics;  Laterality: Right;  . Open Heart Surgery  2010   x 3  . WOUND DEBRIDEMENT Right 10/09/2016   Procedure: DEBRIDEMENT WOUND;  Surgeon: Samara Deist, DPM;  Location: [facility];  Service: Podiatry;  Laterality: Right;   Patient Active Problem List   Diagnosis Date Noted  . AKI (acute kidney injury) ([place]) 10/04/2018  . Acute osteomyelitis of right ankle or foot ([place]) 09/06/2018  . Pressure ulcer of heel, right, unstageable ([place]) 09/06/2018  . Acute osteomyelitis of right foot ([place]) 07/25/2018  . Osteomyelitis ([place]) 07/25/2018  . Anasarca 05/25/2018  . Pressure injury of  skin 05/25/2018  . CKD (chronic kidney disease), stage IV ([place]) 12/15/2016  . Ulcerated, foot ([place]) 12/15/2016  . Gangrene of foot ([place]) 08/21/2016  . HTN (hypertension) 08/21/2016  . Diabetes ([place]) 08/21/2016  . CAD (coronary artery disease) 08/21/2016  . Chronic systolic CHF (congestive heart failure) ([place]) 08/21/2016  . Diabetic foot infection ([employer]) 08/21/2016  . Heterotopic ossification of bone 07/12/2014      Prior to Admission medications   Medication Sig Start Date End Date Taking? Authorizing Provider  allopurinol (ZYLOPRIM) 100 MG tablet Take 1 tablet (100 mg total) by mouth daily. 08/02/18  Yes Fritzi Mandes, MD  amiodarone (PACERONE) 100 MG tablet Take 100 mg by mouth daily.  01/15/18  Yes [provider]  aspirin EC 81 MG tablet Take 1 tablet (81 mg total) by mouth daily. 10/21/16  Yes Theora Gianotti, NP  carvedilol (COREG) 3.125 MG tablet Take 1 tablet (3.125  mg total) by mouth 2 (two) times daily. 05/29/18  Yes Loletha Grayer, MD  clopidogrel (PLAVIX) 75 MG tablet Take 1 tablet (75 mg total) by mouth daily with breakfast. 09/12/18  Yes Mayo, Pete Pelt, MD  dicyclomine (BENTYL) 10 MG capsule Take 1 capsule (10 mg total) by mouth 4 (four) times daily -  before meals and at bedtime. 10/11/18  Yes Wieting, [name], MD  gabapentin (NEURONTIN) 100 MG capsule Take 1 capsule (100 mg total) by mouth 3 (three) times daily. 10/11/18  Yes Wieting, [name], MD  HYDROcodone-acetaminophen (NORCO/VICODIN) 5-325 MG tablet Take 1 tablet by mouth every 6 (six) hours as needed for severe pain. 12/01/18  Yes Sudini, Alveta Heimlich, MD  calcium acetate (PHOSLO) 667 MG capsule Take 1,334 mg by mouth 3 (three) times daily. 12/20/18   [provider]  cefTAZidime (FORTAZ) IVPB Inject 1-2 g into the vein Every Tuesday,Thursday,and Saturday with dialysis for 25 days. Give 1gm with HD on Tuesdays and Thursdays and 2gm on Saturdays Indication:  Osteomyelitis Last Day of Therapy:  12/28/2018  Labs - Once weekly: CK, CBC/D and CMP, 12/03/18 12/28/18  Hillary Bow, MD  daptomycin (CUBICIN) IVPB Inject 750-1,000 mg into the vein Every Tuesday,Thursday,and Saturday with dialysis. Give 750mg  after HD on Tuesdays and Thursdays and on Saturdays give 1000mg  Indication:  Osteomyelitis Last Day of Therapy:  12/28/2018 Labs - Once weekly:  CBC/D, CMP, and CPK 12/01/18   Sudini, Srikar, MD  linezolid (ZYVOX) 600 MG tablet Take 1 tablet (600 mg total) by mouth every 12 (twelve) hours. Patient not taking: Reported on 12/21/2018 12/01/18   Hillary Bow, MD  loperamide (IMODIUM) 2 MG capsule Take 1 capsule (2 mg total) by mouth every 8 (eight) hours as needed for diarrhea or loose stools. 10/11/18   Loletha Grayer, MD  predniSONE (DELTASONE) 5 MG tablet Take 1 tablet (5 mg total) by mouth daily with breakfast. Patient not taking: Reported on 12/21/2018 08/02/18   Fritzi Mandes, MD  torsemide (DEMADEX) 20 MG tablet Take 40 mg (two tablets) on days that you do not have dialysis Patient not taking: Reported on 10/31/2018 10/11/18   Loletha Grayer, MD    Allergies Other, Ibuprofen, Baclofen, Metformin, and Nsaids    Social History Social History   Tobacco Use  . Smoking status: Never Smoker  . Smokeless tobacco: Never Used  Substance Use Topics  . Alcohol use: No  . Drug use: No    Review of Systems Patient denies headaches, rhinorrhea, blurry vision, numbness, shortness of breath, chest pain, edema, cough, abdominal pain, nausea, vomiting, diarrhea, dysuria, fevers, rashes or hallucinations unless otherwise stated above in HPI. ____________________________________________   PHYSICAL EXAM:  VITAL SIGNS: Vitals:   12/21/18 1600 12/21/18 1700  BP: 133/80 (!) 142/76  Pulse: 86 86  Resp: 18 (!) 25  Temp:    SpO2: 100% 100%    Constitutional: Alert and oriented.  Eyes: Conjunctivae are normal.  Head: Atraumatic. Nose: No congestion/rhinnorhea. Mouth/Throat: Mucous membranes are moist.    Neck: No stridor. Painless ROM.  Cardiovascular: Normal rate, regular rhythm. Grossly normal heart sounds.  Good peripheral circulation. Respiratory: Normal respiratory effort.  No retractions. Lungs CTAB. Gastrointestinal: Soft and nontender. No distention. No abdominal bruits. No CVA tenderness. Genitourinary:  Musculoskeletal: Chronic wound and granulation tissue of the right heel with an area of warmth and erythema duskiness of the right lateral heel.  Neurologic:  Normal speech and language. No gross focal neurologic deficits are appreciated. No facial droop Skin:  Skin is  warm, dry and intact. No rash noted. Psychiatric: Mood and affect are normal. Speech and behavior are normal.  ____________________________________________   LABS (all labs ordered are listed, but only abnormal results are displayed)  Results for orders placed or performed during the hospital encounter of 12/21/18 (from the past 24 hour(s))  CBC with Differential/Platelet     Status: Abnormal   Collection Time: 12/21/18  3:12 PM  Result Value Ref Range   WBC 10.9 (H) 4.0 - 10.5 K/uL   RBC 2.86 (L) 4.22 - 5.81 MIL/uL   Hemoglobin 7.7 (L) 13.0 - 17.0 g/dL   HCT 27.1 (L) 39.0 - 52.0 %   MCV 94.8 80.0 - 100.0 fL   MCH 26.9 26.0 - 34.0 pg   MCHC 28.4 (L) 30.0 - 36.0 g/dL   RDW 18.0 (H) 11.5 - 15.5 %   Platelets 382 150 - 400 K/uL   nRBC 0.0 0.0 - 0.2 %   Neutrophils Relative % 83 %   Neutro Abs 9.0 (H) 1.7 - 7.7 K/uL   Lymphocytes Relative 7 %   Lymphs Abs 0.8 0.7 - 4.0 K/uL   Monocytes Relative 9 %   Monocytes Absolute 1.0 0.1 - 1.0 K/uL   Eosinophils Relative 0 %   Eosinophils Absolute 0.0 0.0 - 0.5 K/uL   Basophils Relative 0 %   Basophils Absolute 0.0 0.0 - 0.1 K/uL   Immature Granulocytes 1 %   Abs Immature Granulocytes 0.06 0.00 - 0.07 K/uL  Comprehensive metabolic panel     Status: Abnormal   Collection Time: 12/21/18  3:12 PM  Result Value Ref Range   Sodium 137 135 - 145 mmol/L   Potassium  4.2 3.5 - 5.1 mmol/L   Chloride 104 98 - 111 mmol/L   CO2 22 22 - 32 mmol/L   Glucose, Bld 208 (H) 70 - 99 mg/dL   BUN 28 (H) 6 - 20 mg/dL   Creatinine, Ser 7.16 (H) 0.61 - 1.24 mg/dL   Calcium 7.9 (L) 8.9 - 10.3 mg/dL   Total Protein 7.1 6.5 - 8.1 g/dL   Albumin 2.9 (L) 3.5 - 5.0 g/dL   AST 16 15 - 41 U/L   ALT 11 0 - 44 U/L   Alkaline Phosphatase 108 38 - 126 U/L   Total Bilirubin 0.4 0.3 - 1.2 mg/dL   GFR calc non Af Amer 8 (L) >60 mL/min   GFR calc Af Amer 9 (L) >60 mL/min   Anion gap 11 5 - 15   ____________________________________________  EKG My review and personal interpretation at Time: 15:24   Indication: wound  Rate: 85  Rhythm: a-s-v-p Axis: left Other: paced rhythm, abnormal ekg ____________________________________________  RADIOLOGY  I personally reviewed all radiographic images ordered to evaluate for the above acute complaints and reviewed radiology reports and findings.  These findings were personally discussed with the patient.  Please see medical record for radiology report.  ____________________________________________   PROCEDURES  Procedure(s) performed:  Procedures    Critical Care performed: no ____________________________________________   INITIAL IMPRESSION / ASSESSMENT AND PLAN / ED COURSE  Pertinent labs & imaging results that were available during my care of the patient were reviewed by me and considered in my medical decision making (see chart for details).   DDX: cellulitis, nec fasc, ulcer, limb ischemia  Gabor Lusk. is a 53 y.o. who presents to the ED with symptoms as described above.  Patient arrives afebrile but with purulent foul-smelling chronic wound to the right  foot with worsening erythema and wound breakdown failing outpatient management.  Case was discussed with Dr. Unice Cobble of podiatry is determined to be in the patient's best interest for admission to evaluate for BKA.  Patient also with anemia but hemoglobin greater  than 7 with no chest pain or shortness of breath at this time.  Likely secondary to chronic kidney disease as well as subacute losses given recent wound VAC and open wound.     The patient was evaluated in Emergency Department today for the symptoms described in the history of present illness. He/she was evaluated in the context of the global COVID-19 pandemic, which necessitated consideration that the patient might be at risk for infection with the SARS-CoV-2 virus that causes COVID-19. Institutional protocols and algorithms that pertain to the evaluation of patients at risk for COVID-19 are in a state of rapid change based on information released by regulatory bodies including the CDC and federal and state organizations. These policies and algorithms were followed during the patient's care in the ED.  As part of my medical decision making, I reviewed the following data within the [facility] notes reviewed and incorporated, Labs reviewed, notes from prior ED visits and [place] Controlled Substance Database   ____________________________________________   FINAL CLINICAL IMPRESSION(S) / ED DIAGNOSES  Final diagnoses:  Wound infection      NEW MEDICATIONS STARTED DURING THIS VISIT:  New Prescriptions   No medications on file     Note:  This document was prepared using Dragon voice recognition software and may include unintentional dictation errors.    Merlyn Lot, MD 12/21/18 1729

## 2018-12-21 NOTE — Progress Notes (Signed)
Drainage noted on the right foot dressing. This nurse offered to change or reinforce dressing. Pt. refuses stating that the dressing was changed twice in the ED. Foot elevated. Instructed to call for assistance.

## 2018-12-21 NOTE — H&P (Signed)
[name] NAME: Paul [name]    MR#:  [id number]  DATE OF BIRTH:  [birth year]  DATE OF ADMISSION:  12/21/2018  PRIMARY CARE PHYSICIAN: Tracie Harrier, MD   REQUESTING/REFERRING PHYSICIAN: Merlyn Lot, MD  CHIEF COMPLAINT:   Chief Complaint  Patient presents with  . Wound Check    HISTORY OF PRESENT ILLNESS: Paul [name]  is a 53 y.o. male with a known history of end-stage renal disease, history of necrotic right foot wound with underlying osteomyelitis of the calcaneus who is had multiple admissions for this and has been on multiple antibiotics who was discharged recently on ceftazidime and daptomycin.  Patient had a nurse check his wound and was foul and draining.  Therefore he was sent to the emergency room.  The ED physician spoke to the podiatrist who feels that patient will need below-knee amputation.  Patient denies any fevers chills.  PAST MEDICAL HISTORY:   Past Medical History:  Diagnosis Date  . Anemia   . Cardiac defibrillator in place    a. Biotronik LUmax 540 DRT, (ser # [id number]).  . Carotid arterial disease ([place])    a. s/p prior LICA stenting;  b. 07/2200 Carotid U/S: 40-59% bilat ICA stenosis. Patent LICA stent.  . CHF (congestive heart failure) ([place])   . CKD (chronic kidney disease), stage III ([place])   . Coronary artery disease    a. 2010 s/p CABG x 3.  . DDD (degenerative disc disease), lumbosacral    L5-S1  . Diabetes ([place])    Lantus at bedtime  . Gangrene of toe of right foot ([place])   . Gout of left hand 10/06/2016  . HFrEF (heart failure with reduced ejection fraction) ([place])    a. 07/2016 Echo: EF 25-30%, diff HK, mild MR, mildly dil LA, mod reduced RV fxn, PASP 59mmHg.  [place] Kitchen Hypertension    takes Coreg daily  . Ischemic cardiomyopathy    a. 07/2016 Echo: EF 25-30%, diff HK.  . Myocardial infarction ([place]) 2009  . Peripheral vascular disease ([place])   . Sleep apnea    sleep study yr ago-unable to afford  cpap  . Stroke [facility]) 09   no weakness    PAST SURGICAL HISTORY:  Past Surgical History:  Procedure Laterality Date  . AMPUTATION TOE Right 08/22/2016   Procedure: AMPUTATION TOE;  Surgeon: Samara Deist, DPM;  Location: [facility] OR[facility];  Service: Podiatry;  Laterality: Right;  . AMPUTATION TOE Right 10/09/2016   Procedure: AMPUTATION TOE-RIGHT 2ND MPJ;  Surgeon: Samara Deist, DPM;  Location: [facility];  Service: Podiatry;  Laterality: Right;  . APLIGRAFT PLACEMENT Right 10/09/2016   Procedure: APLIGRAFT PLACEMENT;  Surgeon: Samara Deist, DPM;  Location: [facility];  Service: Podiatry;  Laterality: Right;  . CALCANEAL OSTEOTOMY Bilateral 07/28/2018   Procedure: RIGHT CALCANECTOMY BILATERAL DEBRIDEMENT OF ULCERS ON HEELS;  Surgeon: Albertine Patricia, DPM;  Location: [facility];  Service: Podiatry;  Laterality: Bilateral;  . CARDIAC DEFIBRILLATOR PLACEMENT  2011  . CAROTID ENDARTERECTOMY Left   . CHOLECYSTECTOMY  2010  . CORONARY ARTERY BYPASS GRAFT  2010   CABG x 3   . DIALYSIS/PERMA CATHETER INSERTION N/A 10/07/2018   Procedure: DIALYSIS/PERMA CATHETER INSERTION;  Surgeon: Katha Cabal, MD;  Location: [place] [facility];  Service: Cardiovascular;  Laterality: N/A;  . GRAFT APPLICATION Right 5/42/7062   Procedure: FULL THICKNESS SKIN GRAFT-RIGHT FOOT;  Surgeon: Algernon Huxley, MD;  Location: [facility];  Service: Vascular;  Laterality: Right;  .  HIP SURGERY Right 1994  . INCISION AND DRAINAGE Right 09/08/2018   Procedure: INCISION AND DRAINAGE - [place] OF DEFECTIVE SKIN, SOFT TISSUE AND BONE;  Surgeon: Albertine Patricia, DPM;  Location: [facility];  Service: Podiatry;  Laterality: Right;  . INCISION AND DRAINAGE OF WOUND Right 08/22/2016   Procedure: IRRIGATION AND DEBRIDEMENT WOUND and wound vac placement;  Surgeon: Samara Deist, DPM;  Location: [facility];  Service: Podiatry;  Laterality: Right;  . IRRIGATION AND DEBRIDEMENT ABSCESS Right 11/24/2018   Procedure: IRRIGATION AND DEBRIDEMENT ABSCESS  RIGHT FOOT, COMPLICATED, DIABETIC;  Surgeon: Albertine Patricia, DPM;  Location: [facility];  Service: Podiatry;  Laterality: Right;  . LOWER EXTREMITY ANGIOGRAPHY Right 08/24/2016   Procedure: Lower Extremity Angiography;  Surgeon: Algernon Huxley, MD;  Location: [place] [facility];  Service: Cardiovascular;  Laterality: Right;  . LOWER EXTREMITY ANGIOGRAPHY Right 07/27/2018   Procedure: RIGHT Lower Extremity Angiography;  Surgeon: Algernon Huxley, MD;  Location: [place] [facility];  Service: Cardiovascular;  Laterality: Right;  . LOWER EXTREMITY ANGIOGRAPHY Right 09/09/2018   Procedure: Lower Extremity Angiography;  Surgeon: Katha Cabal, MD;  Location: [facility] [facility];  Service: Cardiovascular;  Laterality: Right;  . MASS EXCISION Right 07/18/2014   Procedure: EXCISION HETEROTOPIC BONE RIGHT HIP;  Surgeon: Frederik Pear, MD;  Location: [facility];  Service: Orthopedics;  Laterality: Right;  . Open Heart Surgery  2010   x 3  . WOUND DEBRIDEMENT Right 10/09/2016   Procedure: DEBRIDEMENT WOUND;  Surgeon: Samara Deist, DPM;  Location: [facility];  Service: Podiatry;  Laterality: Right;    SOCIAL HISTORY:  Social History   Tobacco Use  . Smoking status: Never Smoker  . Smokeless tobacco: Never Used  Substance Use Topics  . Alcohol use: No    FAMILY HISTORY:  Family History  Problem Relation Age of Onset  . Hypertension Other   . Diabetes Other   . Diabetes Father   . Hyperlipidemia Father   . Hypertension Father   . Hyperlipidemia Sister   . Hypertension Sister   . Diabetes Sister   . Diabetes Brother   . Hypertension Brother   . Hyperlipidemia Brother     DRUG ALLERGIES:  Allergies  Allergen Reactions  . Other Other (See Comments)    Cardiac Problems. Pt states he tolerates Toradol. Due to kidney and heart problems per pt  . Ibuprofen Other (See Comments)    Heart problems  . Baclofen Other (See Comments)  . Metformin Diarrhea  . Nsaids     Due to kidney and heart problems  per pt    REVIEW OF SYSTEMS:   CONSTITUTIONAL: No fever, fatigue or weakness.  EYES: No blurred or double vision.  EARS, NOSE, AND THROAT: No tinnitus or ear pain.  RESPIRATORY: No cough, shortness of breath, wheezing or hemoptysis.  CARDIOVASCULAR: No chest pain, orthopnea, edema.  GASTROINTESTINAL: No nausea, vomiting, diarrhea or abdominal pain.  GENITOURINARY: No dysuria, hematuria.  ENDOCRINE: No polyuria, nocturia,  HEMATOLOGY: No anemia, easy bruising or bleeding SKIN: Right foot wound with drainage MUSCULOSKELETAL: No joint pain or arthritis.   NEUROLOGIC: No tingling, numbness, weakness.  PSYCHIATRY: No anxiety or depression.   MEDICATIONS AT HOME:  Prior to Admission medications   Medication Sig Start Date End Date Taking? Authorizing Provider  allopurinol (ZYLOPRIM) 100 MG tablet Take 1 tablet (100 mg total) by mouth daily. 08/02/18  Yes Fritzi Mandes, MD  amiodarone (PACERONE) 100 MG tablet Take 100 mg by mouth daily.  01/15/18  Yes [provider]  aspirin EC 81 MG tablet Take 1 tablet (81 mg total) by mouth daily. 10/21/16  Yes Theora Gianotti, NP  carvedilol (COREG) 3.125 MG tablet Take 1 tablet (3.125 mg total) by mouth 2 (two) times daily. 05/29/18  Yes Loletha Grayer, MD  clopidogrel (PLAVIX) 75 MG tablet Take 1 tablet (75 mg total) by mouth daily with breakfast. 09/12/18  Yes Mayo, Pete Pelt, MD  dicyclomine (BENTYL) 10 MG capsule Take 1 capsule (10 mg total) by mouth 4 (four) times daily -  before meals and at bedtime. 10/11/18  Yes Wieting, [name], MD  gabapentin (NEURONTIN) 100 MG capsule Take 1 capsule (100 mg total) by mouth 3 (three) times daily. 10/11/18  Yes Wieting, [name], MD  HYDROcodone-acetaminophen (NORCO/VICODIN) 5-325 MG tablet Take 1 tablet by mouth every 6 (six) hours as needed for severe pain. 12/01/18  Yes Sudini, Alveta Heimlich, MD  calcium acetate (PHOSLO) 667 MG capsule Take 1,334 mg by mouth 3 (three) times daily. 12/20/18   [provider]  cefTAZidime (FORTAZ) IVPB Inject 1-2 g into the vein Every Tuesday,Thursday,and Saturday with dialysis for 25 days. Give 1gm with HD on Tuesdays and Thursdays and 2gm on Saturdays Indication:  Osteomyelitis Last Day of Therapy:  12/28/2018 Labs - Once weekly: CK, CBC/D and CMP, 12/03/18 12/28/18  Hillary Bow, MD  daptomycin (CUBICIN) IVPB Inject 750-1,000 mg into the vein Every Tuesday,Thursday,and Saturday with dialysis. Give 750mg  after HD on Tuesdays and Thursdays and on Saturdays give 1000mg  Indication:  Osteomyelitis Last Day of Therapy:  12/28/2018 Labs - Once weekly:  CBC/D, CMP, and CPK 12/01/18   Sudini, Srikar, MD  linezolid (ZYVOX) 600 MG tablet Take 1 tablet (600 mg total) by mouth every 12 (twelve) hours. Patient not taking: Reported on 12/21/2018 12/01/18   Hillary Bow, MD  loperamide (IMODIUM) 2 MG capsule Take 1 capsule (2 mg total) by mouth every 8 (eight) hours as needed for diarrhea or loose stools. 10/11/18   Loletha Grayer, MD  predniSONE (DELTASONE) 5 MG tablet Take 1 tablet (5 mg total) by mouth daily with breakfast. Patient not taking: Reported on 12/21/2018 08/02/18   Fritzi Mandes, MD  torsemide (DEMADEX) 20 MG tablet Take 40 mg (two tablets) on days that you do not have dialysis Patient not taking: Reported on 10/31/2018 10/11/18   Loletha Grayer, MD      PHYSICAL EXAMINATION:   VITAL SIGNS: Blood pressure 125/74, pulse 81, temperature 98.5 F (36.9 C), temperature source Oral, resp. rate 18, height 5' 9.5" (1.765 m), weight 90.7 kg, SpO2 97 %.  GENERAL:  53 y.o.-year-old patient lying in the bed with no acute distress.  EYES: Pupils equal, round, reactive to light and accommodation. No scleral icterus. Extraocular muscles intact.  HEENT: Head atraumatic, normocephalic. Oropharynx and nasopharynx clear.  NECK:  Supple, no jugular venous distention. No thyroid enlargement, no tenderness.  LUNGS: Normal breath sounds bilaterally, no wheezing,  rales,rhonchi or crepitation. No use of accessory muscles of respiration.  CARDIOVASCULAR: S1, S2 normal. No murmurs, rubs, or gallops.  ABDOMEN: Soft, nontender, nondistended. Bowel sounds present. No organomegaly or mass.  EXTREMITIES: Right foot wound dressed currently NEUROLOGIC: Cranial nerves II through XII are intact. Muscle strength 5/5 in all extremities. Sensation intact. Gait not checked.  PSYCHIATRIC: The patient is alert and oriented x 3.  SKIN: No obvious rash, lesion, or ulcer.   LABORATORY PANEL:   CBC Recent Labs  Lab 12/21/18 1512  WBC 10.9*  HGB 7.7*  HCT  27.1*  PLT 382  MCV 94.8  MCH 26.9  MCHC 28.4*  RDW 18.0*  LYMPHSABS 0.8  MONOABS 1.0  EOSABS 0.0  BASOSABS 0.0   ------------------------------------------------------------------------------------------------------------------  Chemistries  Recent Labs  Lab 12/21/18 1512  NA 137  K 4.2  CL 104  CO2 22  GLUCOSE 208*  BUN 28*  CREATININE 7.16*  CALCIUM 7.9*  AST 16  ALT 11  ALKPHOS 108  BILITOT 0.4   ------------------------------------------------------------------------------------------------------------------ estimated creatinine clearance is 13.4 mL/min (A) (by C-G formula based on SCr of 7.16 mg/dL (H)). ------------------------------------------------------------------------------------------------------------------ No results for input(s): TSH, T4TOTAL, T3FREE, THYROIDAB in the last 72 hours.  Invalid input(s): FREET3   Coagulation profile No results for input(s): INR, PROTIME in the last 168 hours. ------------------------------------------------------------------------------------------------------------------- No results for input(s): DDIMER in the last 72 hours. -------------------------------------------------------------------------------------------------------------------  Cardiac Enzymes No results for input(s): CKMB, TROPONINI, MYOGLOBIN in the last 168  hours.  Invalid input(s): CK ------------------------------------------------------------------------------------------------------------------ Invalid input(s): POCBNP  ---------------------------------------------------------------------------------------------------------------  Urinalysis    Component Value Date/Time   COLORURINE YELLOW (A) 10/05/2018 1326   APPEARANCEUR HAZY (A) 10/05/2018 1326   LABSPEC 1.009 10/05/2018 1326   PHURINE 5.0 10/05/2018 1326   GLUCOSEU NEGATIVE 10/05/2018 1326   HGBUR SMALL (A) 10/05/2018 1326   BILIRUBINUR NEGATIVE 10/05/2018 1326   [place] 10/05/2018 1326   PROTEINUR 30 (A) 10/05/2018 1326   UROBILINOGEN 1.0 07/16/2014 1558   NITRITE NEGATIVE 10/05/2018 1326   LEUKOCYTESUR NEGATIVE 10/05/2018 1326     RADIOLOGY: Dg Foot Complete Right  Result Date: 12/21/2018 CLINICAL DATA:  Torquing of tissue of the foot. Foot surgery 2 weeks prior with debridement EXAM: RIGHT FOOT COMPLETE - 3+ VIEW COMPARISON:  Radiograph 11/21/2018 FINDINGS: There is new gas tracking along the plantar surface of the calcaneus. This tract extends from the most posterior margin of the calcaneus and extends anteriorly to connect with a large ulceration along the undersurface of the calcaneus. There is no discrete bony erosion along the calcaneal margin. Prior osteotomies of the metatarsals 3, 4 and 5. Prior amputation of the first and second ray at the metatarsophalangeal joints. IMPRESSION: 1. New gas tract extending from the posterior margin of the undersurface of the calcaneus to communicate with a large ulceration. 2. No clear evidence of osseous erosion. Electronically Signed   By: Suzy Bouchard M.D.   On: 12/21/2018 15:45    EKG: Orders placed or performed during the hospital encounter of 12/21/18  . ED EKG  . ED EKG  . EKG 12-Lead  . EKG 12-Lead    IMPRESSION AND PLAN: 1. 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  Podiatry to see the patient discuss  below-knee amputation we will continue same antibiotics as he was doing with dialysis 2.  End-stage renal disease on hemodialysis Tuesday Thursday and Saturday.    Nephrology has been consulted 3.  Paroxysmal atrial fibrillation.  Hold Plavix since the patient may end up needing her procedure.    Hold aspirin as well 4.  Chronic systolic congestive heart failure.  Dialysis to manage fluid 5.  Gout bilateral hands allopurinol and oral prednisone 5 mg daily 6.  Type 2 diabetes mellitus on diet control only. 7.  Chronic loose stools continue same home oral regimen  CODE STATUS: Code Status History    Date Active Date Inactive Code Status Order ID Comments User Context   10/04/2018 1958 10/11/2018 1801 Full Code [id number]  Sela Hua, MD Inpatient   09/06/2018 0259 09/12/2018 2017 Full Code [id number]  Jannifer Franklin,  Shanon Brow, MD Inpatient   07/25/2018 1717 08/02/2018 1919 Full Code [id number]  Dustin Flock, MD Inpatient   05/25/2018 0352 05/29/2018 2022 Full Code [id number]  Hillary Bow, MD ED   10/09/2016 1003 10/09/2016 1340 Full Code [id number]  Samara Deist, [facility] Inpatient   08/22/2016 0042 08/27/2016 1649 Full Code [id number]  Lance Coon, MD Inpatient   07/18/2014 1818 07/20/2014 2002 Full Code [id number]  [facility] Inpatient   Advance Care Planning Activity       TOTAL TIME TAKING CARE OF THIS PATIENT: 55 minutes.    Dustin Flock M.D on 12/21/2018 at 6:16 PM  Between 7am to 6pm - Pager - [phone]  After 6pm go to [email or web address] - password [email or web address]  [facility] Office  [phone]  CC: Primary care physician; Tracie Harrier, MD

## 2018-12-21 NOTE — ED Triage Notes (Signed)
PT arrives via ems from home due to concerns increased darkening of the right foot. Pt had surgery on right foot 2 weeks prior. EMS reports blood sugar in the 200s.

## 2018-12-21 NOTE — ED Notes (Signed)
ED TO INPATIENT HANDOFF REPORT  ED Nurse Name and Phone #: Anson Crofts Name/Age/Gender Paul [name] 53 y.o. male Room/Bed: ED03A/ED03A  Code Status   Code Status: Prior  Home/SNF/Other Home Patient oriented to: self, place, time and situation Is this baseline? Yes   Triage Complete: Triage complete  Chief Complaint leg pain  Triage Note PT arrives via ems from home due to concerns increased darkening of the right foot. Pt had surgery on right foot 2 weeks prior. EMS reports blood sugar in the 200s.   Allergies Allergies  Allergen Reactions  . Other Other (See Comments)    Cardiac Problems. Pt states he tolerates Toradol. Due to kidney and heart problems per pt  . Ibuprofen Other (See Comments)    Heart problems  . Baclofen Other (See Comments)  . Metformin Diarrhea  . Nsaids     Due to kidney and heart problems per pt    Level of Care/Admitting Diagnosis ED Disposition    ED Disposition Condition [facility] Area: [place] [100120]  Level of Care: Med-Surg [16]  Covid Evaluation: Asymptomatic Screening Protocol (No Symptoms)  Diagnosis: Right foot infection [262035]  Admitting Physician: Dustin Flock [[id number]]  Attending Physician: Dustin Flock [[id number]]  Estimated length of stay: past midnight tomorrow  Certification:: I certify this patient will need inpatient services for at least 2 midnights  PT Class (Do Not Modify): Inpatient [101]  PT Acc Code (Do Not Modify): Private [1]       B Medical/Surgery History Past Medical History:  Diagnosis Date  . Anemia   . Cardiac defibrillator in place    a. Biotronik LUmax 540 DRT, (ser # [id number]).  . Carotid arterial disease ([place])    a. s/p prior LICA stenting;  b. 06/4823 Carotid U/S: 40-59% bilat ICA stenosis. Patent LICA stent.  . CHF (congestive heart failure) ([place])   . CKD (chronic kidney disease), stage III ([place])   . Coronary artery disease    a. 2010 s/p  CABG x 3.  . DDD (degenerative disc disease), lumbosacral    L5-S1  . Diabetes ([place])    Lantus at bedtime  . Gangrene of toe of right foot ([place])   . Gout of left hand 10/06/2016  . HFrEF (heart failure with reduced ejection fraction) ([place])    a. 07/2016 Echo: EF 25-30%, diff HK, mild MR, mildly dil LA, mod reduced RV fxn, PASP 50mmHg.  Marland Kitchen Hypertension    takes Coreg daily  . Ischemic cardiomyopathy    a. 07/2016 Echo: EF 25-30%, diff HK.  . Myocardial infarction ([place]) 2009  . Peripheral vascular disease ([place])   . Sleep apnea    sleep study yr ago-unable to afford cpap  . Stroke [facility]) 09   no weakness   Past Surgical History:  Procedure Laterality Date  . AMPUTATION TOE Right 08/22/2016   Procedure: AMPUTATION TOE;  Surgeon: Samara Deist, DPM;  Location: [facility] ORS;  Service: Podiatry;  Laterality: Right;  . AMPUTATION TOE Right 10/09/2016   Procedure: AMPUTATION TOE-RIGHT 2ND MPJ;  Surgeon: Samara Deist, DPM;  Location: [facility] ORS;  Service: Podiatry;  Laterality: Right;  . APLIGRAFT PLACEMENT Right 10/09/2016   Procedure: APLIGRAFT PLACEMENT;  Surgeon: Samara Deist, DPM;  Location: [facility] OR[facility];  Service: Podiatry;  Laterality: Right;  . CALCANEAL OSTEOTOMY Bilateral 07/28/2018   Procedure: RIGHT CALCANECTOMY BILATERAL DEBRIDEMENT OF ULCERS ON HEELS;  Surgeon: Albertine Patricia, DPM;  Location: [facility] OR[facility];  Service: Podiatry;  Laterality: Bilateral;  . CARDIAC DEFIBRILLATOR PLACEMENT  2011  . CAROTID ENDARTERECTOMY Left   . CHOLECYSTECTOMY  2010  . CORONARY ARTERY BYPASS GRAFT  2010   CABG x 3   . DIALYSIS/PERMA CATHETER INSERTION N/A 10/07/2018   Procedure: DIALYSIS/PERMA CATHETER INSERTION;  Surgeon: Katha Cabal, MD;  Location: [place] [facility];  Service: Cardiovascular;  Laterality: N/A;  . GRAFT APPLICATION Right 1/49/7026   Procedure: FULL THICKNESS SKIN GRAFT-RIGHT FOOT;  Surgeon: Algernon Huxley, MD;  Location: [facility];  Service: Vascular;  Laterality: Right;  . HIP SURGERY  Right 1994  . INCISION AND DRAINAGE Right 09/08/2018   Procedure: INCISION AND DRAINAGE - [place] OF DEFECTIVE SKIN, SOFT TISSUE AND BONE;  Surgeon: Albertine Patricia, DPM;  Location: [facility];  Service: Podiatry;  Laterality: Right;  . INCISION AND DRAINAGE OF WOUND Right 08/22/2016   Procedure: IRRIGATION AND DEBRIDEMENT WOUND and wound vac placement;  Surgeon: Samara Deist, DPM;  Location: [facility];  Service: Podiatry;  Laterality: Right;  . IRRIGATION AND DEBRIDEMENT ABSCESS Right 11/24/2018   Procedure: IRRIGATION AND DEBRIDEMENT ABSCESS RIGHT FOOT, COMPLICATED, DIABETIC;  Surgeon: Albertine Patricia, DPM;  Location: [facility];  Service: Podiatry;  Laterality: Right;  . LOWER EXTREMITY ANGIOGRAPHY Right 08/24/2016   Procedure: Lower Extremity Angiography;  Surgeon: Algernon Huxley, MD;  Location: [place] [facility];  Service: Cardiovascular;  Laterality: Right;  . LOWER EXTREMITY ANGIOGRAPHY Right 07/27/2018   Procedure: RIGHT Lower Extremity Angiography;  Surgeon: Algernon Huxley, MD;  Location: [place] [facility];  Service: Cardiovascular;  Laterality: Right;  . LOWER EXTREMITY ANGIOGRAPHY Right 09/09/2018   Procedure: Lower Extremity Angiography;  Surgeon: Katha Cabal, MD;  Location: [place] [facility];  Service: Cardiovascular;  Laterality: Right;  . MASS EXCISION Right 07/18/2014   Procedure: EXCISION HETEROTOPIC BONE RIGHT HIP;  Surgeon: Frederik Pear, MD;  Location: [place];  Service: Orthopedics;  Laterality: Right;  . Open Heart Surgery  2010   x 3  . WOUND DEBRIDEMENT Right 10/09/2016   Procedure: DEBRIDEMENT WOUND;  Surgeon: Samara Deist, DPM;  Location: [facility];  Service: Podiatry;  Laterality: Right;     A IV Location/Drains/Wounds Patient Lines/Drains/Airways Status   Active Line/Drains/Airways    Name:   Placement date:   Placement time:   Site:   Days:   Peripheral IV 10/07/18 Right Other (Comment)   10/07/18    1150    Other (Comment)   75   Peripheral IV 12/21/18  Anterior;Left Forearm   12/21/18    1650    Forearm   less than 1   Hemodialysis Catheter Right Subclavian Double lumen Permanent (Tunneled)   11/24/18    -    Subclavian   27   Sheath 09/09/18 Left Arterial;Femoral   09/09/18    1637    Arterial;Femoral   103   Negative Pressure Wound Therapy Heel Bilateral   07/28/18    0843    -   146   Negative Pressure Wound Therapy Foot Right   11/24/18    -    -   27   Incision (Closed) 07/28/18 Foot Left   07/28/18    0834     146   Incision (Closed) 07/28/18 Foot Right   07/28/18    -     146   Incision (Closed) 09/08/18 Foot Right   09/08/18    1452     104   Incision (Closed) 09/08/18 Leg Right  09/08/18    1456     104   Incision (Closed) 11/24/18 Foot Right   11/24/18    1228     27   Pressure Injury 09/06/18 Unstageable - Full thickness tissue loss in which the base of the ulcer is covered by slough (yellow, tan, gray, green or brown) and/or eschar (tan, brown or black) in the wound bed. Wound on heel with slough and pinhole area dra   09/06/18    0346     106   Pressure Injury 09/06/18 Stage III -  Full thickness tissue loss. Subcutaneous fat may be visible but bone, tendon or muscle are NOT exposed. Red wound bed   09/06/18    0346     106   Wound / Incision (Open or Dehisced) 11/21/18 Diabetic ulcer Heel Right Yellow pink red and black wound bed   11/21/18    1815    Heel   30          Intake/Output Last 24 hours No intake or output data in the 24 hours ending 12/21/18 1941  Labs/Imaging Results for orders placed or performed during the hospital encounter of 12/21/18 (from the past 48 hour(s))  CBC with Differential/Platelet     Status: Abnormal   Collection Time: 12/21/18  3:12 PM  Result Value Ref Range   WBC 10.9 (H) 4.0 - 10.5 K/uL   RBC 2.86 (L) 4.22 - 5.81 MIL/uL   Hemoglobin 7.7 (L) 13.0 - 17.0 g/dL   HCT 27.1 (L) 39.0 - 52.0 %   MCV 94.8 80.0 - 100.0 fL   MCH 26.9 26.0 - 34.0 pg   MCHC 28.4 (L) 30.0 - 36.0 g/dL   RDW 18.0  (H) 11.5 - 15.5 %   Platelets 382 150 - 400 K/uL   nRBC 0.0 0.0 - 0.2 %   Neutrophils Relative % 83 %   Neutro Abs 9.0 (H) 1.7 - 7.7 K/uL   Lymphocytes Relative 7 %   Lymphs Abs 0.8 0.7 - 4.0 K/uL   Monocytes Relative 9 %   Monocytes Absolute 1.0 0.1 - 1.0 K/uL   Eosinophils Relative 0 %   Eosinophils Absolute 0.0 0.0 - 0.5 K/uL   Basophils Relative 0 %   Basophils Absolute 0.0 0.0 - 0.1 K/uL   Immature Granulocytes 1 %   Abs Immature Granulocytes 0.06 0.00 - 0.07 K/uL    Comment: Performed at [facility], [address] [place], [place] [address]  Comprehensive metabolic panel     Status: Abnormal   Collection Time: 12/21/18  3:12 PM  Result Value Ref Range   Sodium 137 135 - 145 mmol/L   Potassium 4.2 3.5 - 5.1 mmol/L   Chloride 104 98 - 111 mmol/L   CO2 22 22 - 32 mmol/L   Glucose, Bld 208 (H) 70 - 99 mg/dL   BUN 28 (H) 6 - 20 mg/dL   Creatinine, Ser 7.16 (H) 0.61 - 1.24 mg/dL   Calcium 7.9 (L) 8.9 - 10.3 mg/dL   Total Protein 7.1 6.5 - 8.1 g/dL   Albumin 2.9 (L) 3.5 - 5.0 g/dL   AST 16 15 - 41 U/L   ALT 11 0 - 44 U/L   Alkaline Phosphatase 108 38 - 126 U/L   Total Bilirubin 0.4 0.3 - 1.2 mg/dL   GFR calc non Af Amer 8 (L) >60 mL/min   GFR calc Af Amer 9 (L) >60 mL/min   Anion gap 11 5 - 15  Comment: Performed at [facility], [address] [place], [place] [address]  SARS Coronavirus 2 [facility] order, Performed in [facility] hospital lab) Nasopharyngeal Nasopharyngeal Swab     Status: None   Collection Time: 12/21/18  5:14 PM   Specimen: Nasopharyngeal Swab  Result Value Ref Range   SARS Coronavirus 2 NEGATIVE NEGATIVE    Comment: (NOTE) If result is NEGATIVE SARS-CoV-2 target nucleic acids are NOT DETECTED. The SARS-CoV-2 RNA is generally detectable in upper and lower  respiratory specimens during the acute phase of infection. The lowest  concentration of SARS-CoV-2 viral copies this assay can detect is 250  copies / mL. A negative  result does not preclude SARS-CoV-2 infection  and should not be used as the sole basis for treatment or other  patient management decisions.  A negative result may occur with  improper specimen collection / handling, submission of specimen other  than nasopharyngeal swab, presence of viral mutation(s) within the  areas targeted by this assay, and inadequate number of viral copies  (<250 copies / mL). A negative result must be combined with clinical  observations, patient history, and epidemiological information. If result is POSITIVE SARS-CoV-2 target nucleic acids are DETECTED. The SARS-CoV-2 RNA is generally detectable in upper and lower  respiratory specimens dur ing the acute phase of infection.  Positive  results are indicative of active infection with SARS-CoV-2.  Clinical  correlation with patient history and other diagnostic information is  necessary to determine patient infection status.  Positive results do  not rule out bacterial infection or co-infection with other viruses. If result is PRESUMPTIVE POSTIVE SARS-CoV-2 nucleic acids MAY BE PRESENT.   A presumptive positive result was obtained on the submitted specimen  and confirmed on repeat testing.  While 2019 novel coronavirus  (SARS-CoV-2) nucleic acids may be present in the submitted sample  additional confirmatory testing may be necessary for epidemiological  and / or clinical management purposes  to differentiate between  SARS-CoV-2 and other Sarbecovirus currently known to infect humans.  If clinically indicated additional testing with an alternate test  methodology [phone]) is advised. The SARS-CoV-2 RNA is generally  detectable in upper and lower respiratory sp ecimens during the acute  phase of infection. The expected result is Negative. Fact Sheet for Patients:  StrictlyIdeas.no Fact Sheet for Healthcare Providers: BankingDealers.co.za This test is not yet  approved or cleared by the [place] FDA and has been authorized for detection and/or diagnosis of SARS-CoV-2 by FDA under an Emergency Use Authorization (EUA).  This EUA will remain in effect (meaning this test can be used) for the duration of the COVID-19 declaration under Section 564(b)(1) of the Act, 21 U.S.C. section 360bbb-3(b)(1), unless the authorization is terminated or revoked sooner. Performed at [facility], [address] [place], [place] [address]    Dg Foot Complete Right  Result Date: 12/21/2018 CLINICAL DATA:  Torquing of tissue of the foot. Foot surgery 2 weeks prior with debridement EXAM: RIGHT FOOT COMPLETE - 3+ VIEW COMPARISON:  Radiograph 11/21/2018 FINDINGS: There is new gas tracking along the plantar surface of the calcaneus. This tract extends from the most posterior margin of the calcaneus and extends anteriorly to connect with a large ulceration along the undersurface of the calcaneus. There is no discrete bony erosion along the calcaneal margin. Prior osteotomies of the metatarsals 3, 4 and 5. Prior amputation of the first and second ray at the metatarsophalangeal joints. IMPRESSION: 1. New gas tract extending from the posterior  margin of the undersurface of the calcaneus to communicate with a large ulceration. 2. No clear evidence of osseous erosion. Electronically Signed   By: Suzy Bouchard M.D.   On: 12/21/2018 15:45    Pending Labs [employer] (From admission, onward)    Start     Ordered   Signed and Held  CBC  (heparin)  Once,   R    Comments: Baseline for heparin therapy IF NOT ALREADY DRAWN.  Notify MD if PLT < 100 K.    Signed and Held   Signed and Held  Creatinine, serum  (heparin)  Once,   R    Comments: Baseline for heparin therapy IF NOT ALREADY DRAWN.    Signed and Held   Signed and Held  CBC  Tomorrow morning,   R     Signed and Held   Signed and Held  Basic metabolic panel  Tomorrow morning,   R     Signed and Held           Vitals/Pain Today's Vitals   12/21/18 1700 12/21/18 1800 12/21/18 1900 12/21/18 1915  BP: (!) 142/76 125/74 133/82   Pulse: 86 81 86   Resp: (!) 25 18 (!) 23   Temp:      TempSrc:      SpO2: 100% 97% 98%   Weight:      Height:      PainSc:    6     Isolation Precautions No active isolations  Medications Medications  predniSONE (DELTASONE) tablet 5 mg (has no administration in time range)    Mobility walks with device Low fall risk   Focused Assessments Renal Assessment Handoff:  Hemodialysis Schedule: Hemodialysis Schedule: Tuesday/Thursday/Saturday Last Hemodialysis date and time:    Restricted appendage: right subclavian     R Recommendations: See Admitting Provider Note  Report given to:   Additional Notes:

## 2018-12-22 DIAGNOSIS — E1169 Type 2 diabetes mellitus with other specified complication: Principal | ICD-10-CM

## 2018-12-22 DIAGNOSIS — B952 Enterococcus as the cause of diseases classified elsewhere: Secondary | ICD-10-CM

## 2018-12-22 DIAGNOSIS — I2510 Atherosclerotic heart disease of native coronary artery without angina pectoris: Secondary | ICD-10-CM

## 2018-12-22 DIAGNOSIS — Z95810 Presence of automatic (implantable) cardiac defibrillator: Secondary | ICD-10-CM

## 2018-12-22 DIAGNOSIS — B9562 Methicillin resistant Staphylococcus aureus infection as the cause of diseases classified elsewhere: Secondary | ICD-10-CM

## 2018-12-22 DIAGNOSIS — L97414 Non-pressure chronic ulcer of right heel and midfoot with necrosis of bone: Secondary | ICD-10-CM

## 2018-12-22 DIAGNOSIS — E1152 Type 2 diabetes mellitus with diabetic peripheral angiopathy with gangrene: Secondary | ICD-10-CM

## 2018-12-22 DIAGNOSIS — N186 End stage renal disease: Secondary | ICD-10-CM

## 2018-12-22 DIAGNOSIS — D631 Anemia in chronic kidney disease: Secondary | ICD-10-CM

## 2018-12-22 DIAGNOSIS — Z888 Allergy status to other drugs, medicaments and biological substances status: Secondary | ICD-10-CM

## 2018-12-22 DIAGNOSIS — E1122 Type 2 diabetes mellitus with diabetic chronic kidney disease: Secondary | ICD-10-CM

## 2018-12-22 DIAGNOSIS — Z96641 Presence of right artificial hip joint: Secondary | ICD-10-CM

## 2018-12-22 DIAGNOSIS — E11621 Type 2 diabetes mellitus with foot ulcer: Secondary | ICD-10-CM

## 2018-12-22 DIAGNOSIS — M86671 Other chronic osteomyelitis, right ankle and foot: Secondary | ICD-10-CM

## 2018-12-22 DIAGNOSIS — I96 Gangrene, not elsewhere classified: Secondary | ICD-10-CM

## 2018-12-22 DIAGNOSIS — B965 Pseudomonas (aeruginosa) (mallei) (pseudomallei) as the cause of diseases classified elsewhere: Secondary | ICD-10-CM

## 2018-12-22 DIAGNOSIS — Z886 Allergy status to analgesic agent status: Secondary | ICD-10-CM

## 2018-12-22 DIAGNOSIS — Z992 Dependence on renal dialysis: Secondary | ICD-10-CM

## 2018-12-22 DIAGNOSIS — Z951 Presence of aortocoronary bypass graft: Secondary | ICD-10-CM

## 2018-12-22 DIAGNOSIS — Z7952 Long term (current) use of systemic steroids: Secondary | ICD-10-CM

## 2018-12-22 LAB — CBC
HCT: 24.5 % — ABNORMAL LOW (ref 39.0–52.0)
Hemoglobin: 7.1 g/dL — ABNORMAL LOW (ref 13.0–17.0)
MCH: 27.1 pg (ref 26.0–34.0)
MCHC: 29 g/dL — ABNORMAL LOW (ref 30.0–36.0)
MCV: 93.5 fL (ref 80.0–100.0)
Platelets: 393 10*3/uL (ref 150–400)
RBC: 2.62 MIL/uL — ABNORMAL LOW (ref 4.22–5.81)
RDW: 17.9 % — ABNORMAL HIGH (ref 11.5–15.5)
WBC: 9.3 10*3/uL (ref 4.0–10.5)
nRBC: 0 % (ref 0.0–0.2)

## 2018-12-22 LAB — BASIC METABOLIC PANEL
Anion gap: 10 (ref 5–15)
BUN: 30 mg/dL — ABNORMAL HIGH (ref 6–20)
CO2: 24 mmol/L (ref 22–32)
Calcium: 7.9 mg/dL — ABNORMAL LOW (ref 8.9–10.3)
Chloride: 106 mmol/L (ref 98–111)
Creatinine, Ser: 8.31 mg/dL — ABNORMAL HIGH (ref 0.61–1.24)
GFR calc Af Amer: 8 mL/min — ABNORMAL LOW (ref >60)
GFR calc non Af Amer: 7 mL/min — ABNORMAL LOW (ref >60)
Glucose, Bld: 106 mg/dL — ABNORMAL HIGH (ref 70–99)
Potassium: 4.6 mmol/L (ref 3.5–5.1)
Sodium: 140 mmol/L (ref 135–145)

## 2018-12-22 LAB — CK: Total CK: 34 U/L — ABNORMAL LOW (ref 49–397)

## 2018-12-22 LAB — GLUCOSE, CAPILLARY
Glucose-Capillary: 87 mg/dL (ref 70–99)
Glucose-Capillary: 124 mg/dL — ABNORMAL HIGH (ref 70–99)
Glucose-Capillary: 228 mg/dL — ABNORMAL HIGH (ref 70–99)
Glucose-Capillary: 187 mg/dL — ABNORMAL HIGH (ref 70–99)

## 2018-12-22 MED ORDER — METRONIDAZOLE IN NACL 5-0.79 MG/ML-% IV SOLN
500.0000 mg | Freq: Two times a day (BID) | INTRAVENOUS | Status: DC
  Administered 2018-12-22 – 2018-12-25 (×5): 500 mg via INTRAVENOUS
  Filled 2018-12-22 (×7): qty 100

## 2018-12-22 MED ORDER — FERROUS SULFATE 325 (65 FE) MG PO TABS
325.0000 mg | ORAL_TABLET | Freq: Two times a day (BID) | ORAL | Status: DC
  Administered 2018-12-24 – 2019-01-03 (×20): 325 mg via ORAL
  Filled 2018-12-22 (×21): qty 1

## 2018-12-22 MED ORDER — CHLORHEXIDINE GLUCONATE CLOTH 2 % EX PADS
6.0000 | MEDICATED_PAD | Freq: Every day | CUTANEOUS | Status: DC
  Administered 2018-12-24 – 2019-01-01 (×8): 6 via TOPICAL

## 2018-12-22 MED ORDER — EPOETIN ALFA 10000 UNIT/ML IJ SOLN
10000.0000 [IU] | INTRAMUSCULAR | Status: DC
  Administered 2018-12-22: 10000 [IU] via INTRAVENOUS

## 2018-12-22 NOTE — Progress Notes (Signed)
[facility]  ROUNDING NOTE   Subjective:   [name] admitted to [facility] on 12/21/2018 for Wound infection [T14.8XXA, L08.9]   Seen and examined on hemodialysis. Tolerating treatment well.     HEMODIALYSIS FLOWSHEET:  Blood Flow Rate (mL/min): 400 mL/min     Objective:  Vital signs in last 24 hours:  Temp:  [98.4 F (36.9 C)-98.5 F (36.9 C)] 98.4 F (36.9 C) (08/20 0843) Pulse Rate:  [80-92] 80 (08/20 0843) Resp:  [16-25] 18 (08/20 0843) BP: (118-156)/(60-91) 130/80 (08/20 0843) SpO2:  [97 %-100 %] 97 % (08/20 0843) Weight:  [90.6 kg-90.7 kg] 90.6 kg (08/19 2031)  Weight change:  Filed Weights   12/21/18 1441 12/21/18 2031  Weight: 90.7 kg 90.6 kg    Intake/Output: I/O last 3 completed shifts: In: 240 [P.O.:240] Out: 200 [Urine:200]   Intake/Output this shift:  No intake/output data recorded.  Physical Exam: General: No acute distress, laying in bed  Head: Normocephalic, atraumatic. Moist oral mucosal membranes  Eyes: Anicteric  Neck: Supple, trachea midline  Lungs:  Clear to auscultation, normal effort  Heart: regular  Abdomen:  Soft, nontender, bowel sounds present  Extremities: 1+ LE edema, right foot wrapped, wound vac in place  Neurologic: Awake, alert, following commands  Skin: No lesions  Access: IJ PermCath.    Basic Metabolic Panel: Recent Labs  Lab 12/21/18 1512 12/22/18 0413  NA 137 140  K 4.2 4.6  CL 104 106  CO2 22 24  GLUCOSE 208* 106*  BUN 28* 30*  CREATININE 7.16* 8.31*  CALCIUM 7.9* 7.9*    Liver Function Tests: Recent Labs  Lab 12/21/18 1512  AST 16  ALT 11  ALKPHOS 108  BILITOT 0.4  PROT 7.1  ALBUMIN 2.9*   No results for input(s): LIPASE, AMYLASE in the last 168 hours. No results for input(s): AMMONIA in the last 168 hours.  CBC: Recent Labs  Lab 12/21/18 1512 12/22/18 0413  WBC 10.9* 9.3  NEUTROABS 9.0*  --   HGB 7.7* 7.1*  HCT 27.1* 24.5*  MCV 94.8 93.5  PLT 382 393     Cardiac Enzymes: No results for input(s): CKTOTAL, CKMB, CKMBINDEX, TROPONINI in the last 168 hours.  BNP: Invalid input(s): POCBNP  CBG: Recent Labs  Lab 12/21/18 2150 12/22/18 0842  GLUCAP 170* 87    Microbiology: Results for orders placed or performed during the hospital encounter of 12/21/18  SARS Coronavirus 2 [facility] order, Performed in [facility] hospital lab) Nasopharyngeal Nasopharyngeal Swab     Status: None   Collection Time: 12/21/18  5:14 PM   Specimen: Nasopharyngeal Swab  Result Value Ref Range Status   SARS Coronavirus 2 NEGATIVE NEGATIVE Final    Comment: (NOTE) If result is NEGATIVE SARS-CoV-2 target nucleic acids are NOT DETECTED. The SARS-CoV-2 RNA is generally detectable in upper and lower  respiratory specimens during the acute phase of infection. The lowest  concentration of SARS-CoV-2 viral copies this assay can detect is 250  copies / mL. A negative result does not preclude SARS-CoV-2 infection  and should not be used as the sole basis for treatment or other  patient management decisions.  A negative result may occur with  improper specimen collection / handling, submission of specimen other  than nasopharyngeal swab, presence of viral mutation(s) within the  areas targeted by this assay, and inadequate number of viral copies  (<250 copies / mL). A negative result must be combined with clinical  observations, patient history, and  epidemiological information. If result is POSITIVE SARS-CoV-2 target nucleic acids are DETECTED. The SARS-CoV-2 RNA is generally detectable in upper and lower  respiratory specimens dur ing the acute phase of infection.  Positive  results are indicative of active infection with SARS-CoV-2.  Clinical  correlation with patient history and other diagnostic information is  necessary to determine patient infection status.  Positive results do  not rule out bacterial infection or co-infection with other viruses. If  result is PRESUMPTIVE POSTIVE SARS-CoV-2 nucleic acids MAY BE PRESENT.   A presumptive positive result was obtained on the submitted specimen  and confirmed on repeat testing.  While 2019 novel coronavirus  (SARS-CoV-2) nucleic acids may be present in the submitted sample  additional confirmatory testing may be necessary for epidemiological  and / or clinical management purposes  to differentiate between  SARS-CoV-2 and other Sarbecovirus currently known to infect humans.  If clinically indicated additional testing with an alternate test  methodology [phone]) is advised. The SARS-CoV-2 RNA is generally  detectable in upper and lower respiratory sp ecimens during the acute  phase of infection. The expected result is Negative. Fact Sheet for Patients:  StrictlyIdeas.no Fact Sheet for Healthcare Providers: BankingDealers.co.za This test is not yet approved or cleared by the [place] FDA and has been authorized for detection and/or diagnosis of SARS-CoV-2 by FDA under an Emergency Use Authorization (EUA).  This EUA will remain in effect (meaning this test can be used) for the duration of the COVID-19 declaration under Section 564(b)(1) of the Act, 21 U.S.C. section 360bbb-3(b)(1), unless the authorization is terminated or revoked sooner. Performed at [facility], [address] [name], [place] [address]     Coagulation Studies: No results for input(s): LABPROT, INR in the last 72 hours.  Urinalysis: No results for input(s): COLORURINE, LABSPEC, PHURINE, GLUCOSEU, HGBUR, BILIRUBINUR, KETONESUR, PROTEINUR, UROBILINOGEN, NITRITE, LEUKOCYTESUR in the last 72 hours.  Invalid input(s): APPERANCEUR    Imaging: Dg Foot Complete Right  Result Date: 12/21/2018 CLINICAL DATA:  Torquing of tissue of the foot. Foot surgery 2 weeks prior with debridement EXAM: RIGHT FOOT COMPLETE - 3+ VIEW COMPARISON:  Radiograph 11/21/2018  FINDINGS: There is new gas tracking along the plantar surface of the calcaneus. This tract extends from the most posterior margin of the calcaneus and extends anteriorly to connect with a large ulceration along the undersurface of the calcaneus. There is no discrete bony erosion along the calcaneal margin. Prior osteotomies of the metatarsals 3, 4 and 5. Prior amputation of the first and second ray at the metatarsophalangeal joints. IMPRESSION: 1. New gas tract extending from the posterior margin of the undersurface of the calcaneus to communicate with a large ulceration. 2. No clear evidence of osseous erosion. Electronically Signed   By: Suzy Bouchard M.D.   On: 12/21/2018 15:45     Medications:   . sodium chloride    . cefTAZidime (FORTAZ)  IV    . [START ON 12/24/2018] cefTAZidime (FORTAZ)  IV    . [START ON 12/24/2018] DAPTOmycin (CUBICIN)  IV    . DAPTOmycin (CUBICIN)  IV     . allopurinol  100 mg Oral Daily  . amiodarone  100 mg Oral Daily  . calcium acetate  1,334 mg Oral TID WC  . carvedilol  3.125 mg Oral BID  . Chlorhexidine Gluconate Cloth  6 each Topical Q0600  . dicyclomine  10 mg Oral TID AC & HS  . gabapentin  100 mg Oral TID  . heparin  5,000 Units Subcutaneous Q8H  . predniSONE  5 mg Oral Q breakfast  . sodium chloride flush  3 mL Intravenous Q12H   sodium chloride, acetaminophen **OR** acetaminophen, HYDROcodone-acetaminophen, loperamide, ondansetron **OR** ondansetron (ZOFRAN) IV, sodium chloride flush  Assessment/ Plan:  Paul [name]. is a 53 y.o. black male with type 2 diabetes mellitus insulin dependent, diabetic neuropathy, hypertension, coronary artery disease status post CABG, systolic and diastolic congestive heart failure, status post AICD, carotid stenosis, stroke, obstructive sleep apnea, peripheral vascular disease, gout, osteomyelitis of heel  [place]   1.  ESRD on HD:  Seen and examined on hemodialysis treatment.     2. Hypertension:  - Continue carvedilol.   3. Anemia of chronic kidney disease:   - EPO with HD treatment.   4. Secondary Hyperparathyroidism: not currently on binders.   5. Diabetes mellitus type II with chronic kidney disease: history of poor control.   6. 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 Currently on ceftazidime and daptomycin with dialysis treatment - Appreciate vascular, podiatry and infectious disease input.    LOS: 1 Znya Albino 8/20/20208:56 AM

## 2018-12-22 NOTE — Consult Note (Signed)
[facility] Vascular Consult Note  MRN : [id number]  Paul [name]. is a 53 y.o. (Jul 15, 1965) male who presents with chief complaint of  Chief Complaint  Patient presents with  . Wound Check   History of Present Illness:  The patient is a 53 year old male with multiple medical issues (see below) well-known to our service who presented to the [facility] emergency department yesterday with a chief complaint of worsening right heel wound.  The patient is known to our service as we have treated him for his end-stage renal disease as well as peripheral artery disease.  The patient has undergone multiple procedures in regard to his heel wound by podiatry.  The patient noted progressively worsening drainage and discomfort to the right foot prompting him to seek medical attention.  He denies any fever, nausea vomiting.  Denies any shortness of breath or chest pain.  The patient was seen by podiatry this morning who feel due to the multiple debridements and procedures and lack of healing limb salvage at this point is futile.  Podiatry recommend below the knee amputation.  Vascular surgery was consulted by Dr. Vickki Muff for recommendations in regard to a possible below the knee amputation Current Facility-Administered Medications  Medication Dose Route Frequency Provider Last Rate Last Dose  . 0.9 %  sodium chloride infusion  250 mL Intravenous PRN Dustin Flock, MD      . acetaminophen (TYLENOL) tablet 650 mg  650 mg Oral Q6H PRN Dustin Flock, MD       Or  . acetaminophen (TYLENOL) suppository 650 mg  650 mg Rectal Q6H PRN Dustin Flock, MD      . allopurinol (ZYLOPRIM) tablet 100 mg  100 mg Oral Daily Dustin Flock, MD   100 mg at 12/22/18 0844  . amiodarone (PACERONE) tablet 100 mg  100 mg Oral Daily Dustin Flock, MD   100 mg at 12/22/18 0846  . calcium acetate (PHOSLO) capsule 1,334 mg  1,334 mg Oral TID WC Dustin Flock, MD   1,334  mg at 12/22/18 0843  . carvedilol (COREG) tablet 3.125 mg  3.125 mg Oral BID Dustin Flock, MD   3.125 mg at 12/22/18 0844  . cefTAZidime (FORTAZ) 1 g in sodium chloride 0.9 % 100 mL IVPB  1 g Intravenous Once per day on Tue Thu Patel, Shreyang, MD      . Derrill Memo ON 12/24/2018] cefTAZidime (FORTAZ) 2 g in sodium chloride 0.9 % 100 mL IVPB  2 g Intravenous Q Sat-HD Dustin Flock, MD      . Chlorhexidine Gluconate Cloth 2 % PADS 6 each  6 each Topical Q0600 Kolluru, Sarath, MD      . Derrill Memo ON 12/24/2018] DAPTOmycin (CUBICIN) 1,000 mg in sodium chloride 0.9 % IVPB  1,000 mg Intravenous Q Sat-HD Dustin Flock, MD      . DAPTOmycin (CUBICIN) 750 mg in sodium chloride 0.9 % IVPB  750 mg Intravenous Once per day on Tue Thu Patel, Chana Bode, MD      . dicyclomine (BENTYL) capsule 10 mg  10 mg Oral TID AC & HS Dustin Flock, MD   10 mg at 12/22/18 0844  . ferrous sulfate tablet 325 mg  325 mg Oral BID WC Vaughan Basta, MD      . gabapentin (NEURONTIN) capsule 100 mg  100 mg Oral TID Dustin Flock, MD   100 mg at 12/22/18 1532  . heparin injection 5,000 Units  5,000 Units Subcutaneous Q8H Dustin Flock, MD      .  HYDROcodone-acetaminophen (NORCO/VICODIN) 5-325 MG per tablet 1 tablet  1 tablet Oral Q6H PRN Dustin Flock, MD   1 tablet at 12/22/18 1532  . loperamide (IMODIUM) capsule 2 mg  2 mg Oral Q8H PRN Dustin Flock, MD      . ondansetron [facility]) tablet 4 mg  4 mg Oral Q6H PRN Dustin Flock, MD       Or  . ondansetron (ZOFRAN) injection 4 mg  4 mg Intravenous Q6H PRN Dustin Flock, MD      . predniSONE (DELTASONE) tablet 5 mg  5 mg Oral Q breakfast Dustin Flock, MD   5 mg at 12/22/18 0857  . sodium chloride flush (NS) 0.9 % injection 3 mL  3 mL Intravenous Q12H Dustin Flock, MD   3 mL at 12/21/18 2143  . sodium chloride flush (NS) 0.9 % injection 3 mL  3 mL Intravenous PRN Dustin Flock, MD       Past Medical History:  Diagnosis Date  . Anemia   . Cardiac  defibrillator in place    a. Biotronik LUmax 540 DRT, (ser # [id number]).  . Carotid arterial disease ([place])    a. s/p prior LICA stenting;  b. 11/8293 Carotid U/S: 40-59% bilat ICA stenosis. Patent LICA stent.  . CHF (congestive heart failure) ([place])   . CKD (chronic kidney disease), stage III ([place])   . Coronary artery disease    a. 2010 s/p CABG x 3.  . DDD (degenerative disc disease), lumbosacral    L5-S1  . Diabetes ([place])    Lantus at bedtime  . Gangrene of toe of right foot ([place])   . Gout of left hand 10/06/2016  . HFrEF (heart failure with reduced ejection fraction) ([place])    a. 07/2016 Echo: EF 25-30%, diff HK, mild MR, mildly dil LA, mod reduced RV fxn, PASP 65mmHg.  Marland Kitchen Hypertension    takes Coreg daily  . Ischemic cardiomyopathy    a. 07/2016 Echo: EF 25-30%, diff HK.  . Myocardial infarction ([place]) 2009  . Peripheral vascular disease ([place])   . Sleep apnea    sleep study yr ago-unable to afford cpap  . Stroke [facility]) 09   no weakness   Past Surgical History:  Procedure Laterality Date  . AMPUTATION TOE Right 08/22/2016   Procedure: AMPUTATION TOE;  Surgeon: Samara Deist, DPM;  Location: [facility] OR[facility];  Service: Podiatry;  Laterality: Right;  . AMPUTATION TOE Right 10/09/2016   Procedure: AMPUTATION TOE-RIGHT 2ND MPJ;  Surgeon: Samara Deist, DPM;  Location: [facility];  Service: Podiatry;  Laterality: Right;  . APLIGRAFT PLACEMENT Right 10/09/2016   Procedure: APLIGRAFT PLACEMENT;  Surgeon: Samara Deist, DPM;  Location: [facility];  Service: Podiatry;  Laterality: Right;  . CALCANEAL OSTEOTOMY Bilateral 07/28/2018   Procedure: RIGHT CALCANECTOMY BILATERAL DEBRIDEMENT OF ULCERS ON HEELS;  Surgeon: Albertine Patricia, DPM;  Location: [facility] OR[facility];  Service: Podiatry;  Laterality: Bilateral;  . CARDIAC DEFIBRILLATOR PLACEMENT  2011  . CAROTID ENDARTERECTOMY Left   . CHOLECYSTECTOMY  2010  . CORONARY ARTERY BYPASS GRAFT  2010   CABG x 3   . DIALYSIS/PERMA CATHETER INSERTION N/A 10/07/2018    Procedure: DIALYSIS/PERMA CATHETER INSERTION;  Surgeon: Katha Cabal, MD;  Location: [place] [facility];  Service: Cardiovascular;  Laterality: N/A;  . GRAFT APPLICATION Right 10/22/3084   Procedure: FULL THICKNESS SKIN GRAFT-RIGHT FOOT;  Surgeon: Algernon Huxley, MD;  Location: [facility];  Service: Vascular;  Laterality: Right;  . HIP SURGERY Right 1994  . INCISION AND  DRAINAGE Right 09/08/2018   Procedure: INCISION AND DRAINAGE - [place] OF DEFECTIVE SKIN, SOFT TISSUE AND BONE;  Surgeon: Albertine Patricia, DPM;  Location: [facility];  Service: Podiatry;  Laterality: Right;  . INCISION AND DRAINAGE OF WOUND Right 08/22/2016   Procedure: IRRIGATION AND DEBRIDEMENT WOUND and wound vac placement;  Surgeon: Samara Deist, DPM;  Location: [facility];  Service: Podiatry;  Laterality: Right;  . IRRIGATION AND DEBRIDEMENT ABSCESS Right 11/24/2018   Procedure: IRRIGATION AND DEBRIDEMENT ABSCESS RIGHT FOOT, COMPLICATED, DIABETIC;  Surgeon: Albertine Patricia, DPM;  Location: [facility];  Service: Podiatry;  Laterality: Right;  . LOWER EXTREMITY ANGIOGRAPHY Right 08/24/2016   Procedure: Lower Extremity Angiography;  Surgeon: Algernon Huxley, MD;  Location: [place] [facility];  Service: Cardiovascular;  Laterality: Right;  . LOWER EXTREMITY ANGIOGRAPHY Right 07/27/2018   Procedure: RIGHT Lower Extremity Angiography;  Surgeon: Algernon Huxley, MD;  Location: [place] [facility];  Service: Cardiovascular;  Laterality: Right;  . LOWER EXTREMITY ANGIOGRAPHY Right 09/09/2018   Procedure: Lower Extremity Angiography;  Surgeon: Katha Cabal, MD;  Location: [place] [facility];  Service: Cardiovascular;  Laterality: Right;  . MASS EXCISION Right 07/18/2014   Procedure: EXCISION HETEROTOPIC BONE RIGHT HIP;  Surgeon: Frederik Pear, MD;  Location: [place];  Service: Orthopedics;  Laterality: Right;  . Open Heart Surgery  2010   x 3  . WOUND DEBRIDEMENT Right 10/09/2016   Procedure: DEBRIDEMENT WOUND;  Surgeon: Samara Deist, DPM;   Location: [facility];  Service: Podiatry;  Laterality: Right;   Social History Social History   Tobacco Use  . Smoking status: Never Smoker  . Smokeless tobacco: Never Used  Substance Use Topics  . Alcohol use: No  . Drug use: No   Family History Family History  Problem Relation Age of Onset  . Hypertension Other   . Diabetes Other   . Diabetes Father   . Hyperlipidemia Father   . Hypertension Father   . Hyperlipidemia Sister   . Hypertension Sister   . Diabetes Sister   . Diabetes Brother   . Hypertension Brother   . Hyperlipidemia Brother   Denies family history of peripheral artery disease, venous disease or renal disease  Allergies  Allergen Reactions  . Other Other (See Comments)    Cardiac Problems. Pt states he tolerates Toradol. Due to kidney and heart problems per pt  . Ibuprofen Other (See Comments)    Heart problems  . Baclofen Other (See Comments)  . Metformin Diarrhea  . Nsaids     Due to kidney and heart problems per pt   REVIEW OF SYSTEMS (Negative unless checked)  Constitutional: [] Weight loss  [] Fever  [] Chills Cardiac: [] Chest pain   [] Chest pressure   [] Palpitations   [] Shortness of breath when laying flat   [] Shortness of breath at rest   [] Shortness of breath with exertion. Vascular:  [] Pain in legs with walking   [x] Pain in legs at rest   [x] Pain in legs when laying flat   [] Claudication   [] Pain in feet when walking  [] Pain in feet at rest  [] Pain in feet when laying flat   [] History of DVT   [] Phlebitis   [] Swelling in legs   [] Varicose veins   [x] Non-healing ulcers Pulmonary:   [] Uses home oxygen   [] Productive cough   [] Hemoptysis   [] Wheeze  [] COPD   [] Asthma Neurologic:  [] Dizziness  [] Blackouts   [] Seizures   [] History of stroke   [] History of TIA  [] Aphasia   []   Temporary blindness   [] Dysphagia   [] Weakness or numbness in arms   [] Weakness or numbness in legs Musculoskeletal:  [] Arthritis   [] Joint swelling   [] Joint pain   [] Low back  pain Hematologic:  [] Easy bruising  [] Easy bleeding   [] Hypercoagulable state   [] Anemic  [] Hepatitis Gastrointestinal:  [] Blood in stool   [] Vomiting blood  [] Gastroesophageal reflux/heartburn   [] Difficulty swallowing. Genitourinary:  [x] Chronic kidney disease   [] Difficult urination  [] Frequent urination  [] Burning with urination   [] Blood in urine Skin:  [] Rashes   [x] Ulcers   [x] Wounds Psychological:  [] History of anxiety   []  History of major depression.  Physical Examination  Vitals:   12/22/18 1245 12/22/18 1300 12/22/18 1315 12/22/18 1718  BP: (!) 90/53 113/75 120/69 99/69  Pulse: 83 84 84 80  Resp: (!) 30 (!) 21 (!) 24   Temp:  98.8 F (37.1 C) 98.8 F (37.1 C) 98.3 F (36.8 C)  TempSrc:  Oral Oral Oral  SpO2: 99% 100% 100% 97%  Weight:      Height:       Body mass index is 29.3 kg/m. Gen:  WD/WN, NAD Head: [place]/AT, No temporalis wasting. Prominent temp pulse not noted. Ear/Nose/Throat: Hearing grossly intact, nares w/o erythema or drainage, oropharynx w/o Erythema/Exudate Eyes: Sclera non-icteric, conjunctiva clear Neck: Trachea midline.  No JVD.  Pulmonary:  Good air movement, respirations not labored, equal bilaterally.  Cardiac: RRR, normal S1, S2. Vascular:  Vessel Right Left  Radial Palpable Palpable  Ulnar Palpable Palpable  Brachial Palpable Palpable  Carotid Palpable, without bruit Palpable, without bruit  Aorta Not palpable N/A  Femoral Palpable Palpable  Popliteal Palpable Palpable  PT Non-Palpable Palpable  DP Non-Palpable Palpable   Right lower extremity: Thigh soft.  Calf soft.  There is bone exposed at the heel.   Gastrointestinal: soft, non-tender/non-distended. No guarding/reflex.  Musculoskeletal: M/S 5/5 throughout.  Extremities without ischemic changes.  No deformity or atrophy. No edema. Neurologic: Sensation grossly intact in extremities.  Symmetrical.  Speech is fluent. Motor exam as listed above. Psychiatric: Judgment intact, Mood &  affect appropriate for pt's clinical situation. Dermatologic: See above Lymph : No Cervical, Axillary, or Inguinal lymphadenopathy.  CBC Lab Results  Component Value Date   WBC 9.3 12/22/2018   HGB 7.1 (L) 12/22/2018   HCT 24.5 (L) 12/22/2018   MCV 93.5 12/22/2018   PLT 393 12/22/2018   BMET    Component Value Date/Time   NA 140 12/22/2018 0413   NA 139 06/23/2016 1553   K 4.6 12/22/2018 0413   CL 106 12/22/2018 0413   CO2 24 12/22/2018 0413   GLUCOSE 106 (H) 12/22/2018 0413   BUN 30 (H) 12/22/2018 0413   BUN 46 (H) 06/23/2016 1553   CREATININE 8.31 (H) 12/22/2018 0413   CALCIUM 7.9 (L) 12/22/2018 0413   GFRNONAA 7 (L) 12/22/2018 0413   GFRAA 8 (L) 12/22/2018 0413   Estimated Creatinine Clearance: 11.4 mL/min (A) (by C-G formula based on SCr of 8.31 mg/dL (H)).  COAG Lab Results  Component Value Date   INR 1.1 11/21/2018   INR 1.18 05/25/2018   INR 1.18 07/16/2014   Radiology Dg Foot Complete Right  Result Date: 12/21/2018 CLINICAL DATA:  Torquing of tissue of the foot. Foot surgery 2 weeks prior with debridement EXAM: RIGHT FOOT COMPLETE - 3+ VIEW COMPARISON:  Radiograph 11/21/2018 FINDINGS: There is new gas tracking along the plantar surface of the calcaneus. This tract extends from the most posterior margin of  the calcaneus and extends anteriorly to connect with a large ulceration along the undersurface of the calcaneus. There is no discrete bony erosion along the calcaneal margin. Prior osteotomies of the metatarsals 3, 4 and 5. Prior amputation of the first and second ray at the metatarsophalangeal joints. IMPRESSION: 1. New gas tract extending from the posterior margin of the undersurface of the calcaneus to communicate with a large ulceration. 2. No clear evidence of osseous erosion. Electronically Signed   By: Suzy Bouchard M.D.   On: 12/21/2018 15:45   Assessment/Plan The patient is a 53 year old male with multiple medical issues including nonhealing right heel  wound 1.  Right heel wound: The patient has undergone multiple endovascular interventions to improve blood flow as well as multiple procedures by podiatry for limb salvage.  Unfortunately the patient presents with worsening right heel wound.  Seen by podiatry who feels salvage of the right foot is futile at this point.  Recommended below the knee amputation.  Recommend right below the knee amputation.  Procedure, risks and benefits explained to the patient.  All questions answered.  The patient wishes to proceed.  We will plan for this tomorrow afternoon with Dr. Lucky Cowboy. 2. ESRD: Nephrology has been consulted.  The patient has dialyzed today without issue. 3.  Diabetes: On appropriate medications. Encouraged good control as its slows the progression of atherosclerotic disease  Discussed with Dr. Mayme Genta, PA-C  12/22/2018 5:51 PM  This note was created with Dragon medical transcription system.  Any error is purely unintentional

## 2018-12-22 NOTE — TOC Progression Note (Signed)
Transition of Care (TOC) - Progression Note    Patient Details  Name: Paul [name]. MRN: [id number] Date of Birth: 1966-03-23  Transition of Care [facility]) CM/SW Contact  [name], RN Phone Number: 12/22/2018, 3:49 PM  Clinical Narrative:    Met with the patient and he stated that he only goes to Dialysis in Tue and Thursday due to the community transportation he uses does not provide transportation on Sat.  I gave the patient a resource list with other transportation options on it.  He said that another company stated they charge $85 round trip so he declined them, He will call the other options on the resource list.  He wanted me to set up a prothesis for when he gets a BKA done.  I explained that his physician would set all of that up and it was beyond my scope.  I also explained they would need to wait until it was healed for proper fitting.  He stated that he was aware of all of that and that he would talk to his physician about it.   The patient has a Wheelchair at home that was set up at the last admission, he also has a wound vac and has been getting wound care at home.         Expected Discharge Plan and Services           Expected Discharge Date: 12/23/18                                     Social Determinants of Health (SDOH) Interventions    Readmission Risk Interventions Readmission Risk Prevention Plan 11/25/2018 10/06/2018 09/09/2018  Transportation Screening Complete Complete Complete  PCP or Specialist Appt within 3-5 Days Complete Complete Complete  HRI or Home Care Consult Complete Complete Complete  Social Work Consult for [place] Planning/Counseling Complete Complete Not Complete  SW consult not completed comments - - NA  Palliative Care Screening Not Applicable Not Applicable Not Applicable  Medication Review (RN Care Manager) Complete - -  Some recent data might be hidden

## 2018-12-22 NOTE — Progress Notes (Signed)
Pt is transferred from his room 148A to receive HD Tx. Report was given to Dialysis RN. Pt is ready to receive Tx. Pt has reported no pain or S.O.B. Vitals are WDL.   12/22/18 0940  Hand-Off documentation  Report given to (Full Name) Beatris Ship  Report received from (Full Name) Madilyn Fireman RN   Vital Signs  Temp 99 F (37.2 C)  Temp Source Oral  Pulse Rate 79  Pulse Rate Source Monitor  Resp 19  BP 129/80  BP Location Left Arm  BP Method Automatic  Patient Position (if appropriate) Lying  Oxygen Therapy  SpO2 100 %  O2 Device Room Air  Pain Assessment  Pain Scale 0-10  Pain Score 0  Dialysis Weight  Weight 90 kg  Type of Weight Pre-Dialysis  Time-Out for Hemodialysis  What Procedure? HD  Pt Identifiers(min of two) First/Last Name;MRN/Account#  Correct Site? Yes  Correct Side? Yes  Correct Procedure? Yes  Consents Verified? Yes  Safety Precautions Reviewed? Yes  Engineer, civil (consulting) Number [place] Number 2  UF/Alarm Test Passed  Conductivity: Meter 14.1  Conductivity: Machine  14.6  pH 7.2  Reverse Osmosis MAIN  Normal Saline Lot Number [id number]  Dialyzer Lot Number [id number]  Disposable Set Lot Number [id number]  Machine Temperature 96.8 F (36 C)  Musician and Audible Yes  Blood Lines Intact and Secured Yes  Pre Treatment Patient Checks  Vascular access used during treatment Catheter  HD catheter dressing before treatment WDL  Patient is receiving dialysis in a chair  (In Bed)  Hepatitis B Surface Antigen Results Negative  Date Hepatitis B Surface Antigen Drawn 11/22/18  Hepatitis B Surface Antibody  (<3.1)  Date Hepatitis B Surface Antibody Drawn 11/24/18  Hemodialysis Consent Verified Yes  Hemodialysis Standing Orders Initiated Yes  ECG (Telemetry) Monitor On Yes  Prime Ordered Normal Saline  Length of  DialysisTreatment -hour(s) 3 Hour(s)  Dialysis Treatment Comments  (N140)  Dialyzer Elisio 17H NR  Dialysate 3K;2.5 Ca  Dialysis  Anticoagulant None  Dialysate Flow Ordered 600  Blood Flow Rate Ordered 400 mL/min  Ultrafiltration Goal 2 Liters  Pre Treatment Labs Renal panel;CBC  Dialysis Blood Pressure Support Ordered Albumin  Hemodialysis Catheter Right Subclavian Double lumen Permanent (Tunneled)  Placement Date: (c) 11/24/18   Orientation: Right  Access Location: Subclavian  Hemodialysis Catheter Type: Double lumen Permanent (Tunneled)  Site Condition No complications  Blue Lumen Status Blood return noted  Red Lumen Status Blood return noted  Purple Lumen Status N/A  Catheter fill solution Heparin 1000 units/ml  Catheter fill volume (Arterial) 1.5 cc  Catheter fill volume (Venous) 1.5  Dressing Type Gauze/Drain sponge  Dressing Status Clean;Dry;Intact  Interventions Dressing reinforced  Drainage Description None

## 2018-12-22 NOTE — Anesthesia Preprocedure Evaluation (Addendum)
Anesthesia Evaluation  Patient identified by MRN, date of birth, ID band Patient awake    Reviewed: Allergy & Precautions, NPO status , Patient's Chart, lab work & pertinent test results  History of Anesthesia Complications Negative for: history of anesthetic complications  Airway Mallampati: II       Dental   Pulmonary sleep apnea (resolved with weight loss) , neg COPD,           Cardiovascular hypertension, + CAD, + Past MI, + Peripheral Vascular Disease and +CHF  + pacemaker      Neuro/Psych neg Seizures CVA negative psych ROS   GI/Hepatic Neg liver ROS, neg GERD  ,  Endo/Other  diabetes, Type 2, Oral Hypoglycemic Agents  Renal/GU Dialysis and CRFRenal disease     Musculoskeletal  (+) Arthritis , Osteoarthritis,    Abdominal   Peds  Hematology  (+) anemia ,   Anesthesia Other Findings    Reproductive/Obstetrics                          Anesthesia Physical Anesthesia Plan  ASA: IV  Anesthesia Plan: General   Post-op Pain Management:    Induction: Intravenous  PONV Risk Score and Plan: Ondansetron and Treatment may vary due to age or medical condition  Airway Management Planned: Oral ETT  Additional Equipment:   Intra-op Plan:   Post-operative Plan: Extubation in OR  Informed Consent: I have reviewed the patients History and Physical, chart, labs and discussed the procedure including the risks, benefits and alternatives for the proposed anesthesia with the patient or authorized representative who has indicated his/her understanding and acceptance.       Plan Discussed with:   Anesthesia Plan Comments:        Anesthesia Quick Evaluation                                  Anesthesia Evaluation  Patient identified by MRN, date of birth, ID band Patient awake    Reviewed: Allergy & Precautions, H&P , NPO status , Patient's Chart, lab work & pertinent test  results  History of Anesthesia Complications Negative for: history of anesthetic complications  Airway Mallampati: II  TM Distance: >3 FB Neck ROM: Full    Dental  (+) Upper Dentures, Lower Dentures   Pulmonary neg sleep apnea, neg COPD,    breath sounds clear to auscultation- rhonchi (-) wheezing      Cardiovascular hypertension, Pt. on medications + CAD, + Past MI, + CABG (2010), + Peripheral Vascular Disease and +CHF (ICD)  (-) Cardiac Stents + Cardiac Defibrillator  Rhythm:Regular Rate:Normal - Systolic murmurs and - Diastolic murmurs    Neuro/Psych neg Seizures CVA, No Residual Symptoms negative psych ROS   GI/Hepatic negative GI ROS, Neg liver ROS,   Endo/Other  diabetes, Poorly Controlled  Renal/GU CRFRenal disease     Musculoskeletal  (+) Arthritis ,   Abdominal (+) + obese,   Peds  Hematology  (+) Blood dyscrasia, anemia ,   Anesthesia Other Findings Past Medical History: No date: Anemia No date: Cardiac defibrillator in place     Comment:  a. Biotronik LUmax 540 DRT, (ser # [id number]). No date: Carotid arterial disease ([place])     Comment:  a. s/p prior LICA stenting;  b. 06/1192 Carotid U/S:               40-59% bilat ICA stenosis.  Patent LICA stent. No date: CHF (congestive heart failure) (HCC) No date: CKD (chronic kidney disease), stage III ([place]) No date: Coronary artery disease     Comment:  a. 2010 s/p CABG x 3. No date: DDD (degenerative disc disease), lumbosacral     Comment:  L5-S1 No date: Diabetes [facility])     Comment:  Lantus at bedtime No date: Gangrene of toe of right foot ([place]) 10/06/2016: Gout of left hand No date: HFrEF (heart failure with reduced ejection fraction) ([place])     Comment:  a. 07/2016 Echo: EF 25-30%, diff HK, mild MR, mildly dil               LA, mod reduced RV fxn, PASP 32mmHg. No date: Hypertension     Comment:  takes Coreg daily No date: Ischemic cardiomyopathy     Comment:  a. 07/2016 Echo: EF 25-30%, diff  HK. 2009: Myocardial infarction ([place]) No date: Peripheral vascular disease ([place]) No date: Sleep apnea     Comment:  sleep study yr ago-unable to afford cpap 09: Stroke [facility])     Comment:  no weakness  Past Surgical History: 08/22/2016: AMPUTATION TOE; Right     Comment:  Procedure: AMPUTATION TOE;  Surgeon: Samara Deist, DPM;              Location: [facility] ORS;  Service: Podiatry;  Laterality:               Right; 10/09/2016: AMPUTATION TOE; Right     Comment:  Procedure: AMPUTATION TOE-RIGHT 2ND MPJ;  Surgeon:               Samara Deist, DPM;  Location: [facility] OR[facility];  Service:               Podiatry;  Laterality: Right; 10/09/2016: APLIGRAFT PLACEMENT; Right     Comment:  Procedure: APLIGRAFT PLACEMENT;  Surgeon: Samara Deist, DPM;  Location: [facility] OR[facility];  Service: Podiatry;                Laterality: Right; 07/28/2018: CALCANEAL OSTEOTOMY; Bilateral     Comment:  Procedure: RIGHT CALCANECTOMY BILATERAL DEBRIDEMENT OF               ULCERS ON HEELS;  Surgeon: Albertine Patricia, DPM;                Location: [facility] OR[facility];  Service: Podiatry;  Laterality:               Bilateral; 2011: CARDIAC DEFIBRILLATOR PLACEMENT No date: CAROTID ENDARTERECTOMY; Left 2010: CHOLECYSTECTOMY 2010: CORONARY ARTERY BYPASS GRAFT     Comment:  CABG x 3  8/58/8502: GRAFT APPLICATION; Right     Comment:  Procedure: FULL THICKNESS SKIN GRAFT-RIGHT FOOT;                Surgeon: Algernon Huxley, MD;  Location: [facility] OR[facility];  Service:              Vascular;  Laterality: Right; 1994: HIP SURGERY; Right 08/22/2016: INCISION AND DRAINAGE OF WOUND; Right     Comment:  Procedure: IRRIGATION AND DEBRIDEMENT WOUND and wound               vac placement;  Surgeon: Samara Deist, DPM;  Location:               [facility];  Service: Podiatry;  Laterality: Right; 08/24/2016: LOWER EXTREMITY ANGIOGRAPHY; Right  Comment:  Procedure: Lower Extremity Angiography;  Surgeon: Algernon Huxley, MD;  Location: [place]  CV LAB;  Service:               Cardiovascular;  Laterality: Right; 07/27/2018: LOWER EXTREMITY ANGIOGRAPHY; Right     Comment:  Procedure: RIGHT Lower Extremity Angiography;  Surgeon:               Algernon Huxley, MD;  Location: [place] CV LAB;                Service: Cardiovascular;  Laterality: Right; 07/18/2014: MASS EXCISION; Right     Comment:  Procedure: EXCISION HETEROTOPIC BONE RIGHT HIP;                Surgeon: Frederik Pear, MD;  Location: [place];  Service:               Orthopedics;  Laterality: Right; 2010: Open Heart Surgery     Comment:  x 3 10/09/2016: WOUND DEBRIDEMENT; Right     Comment:  Procedure: DEBRIDEMENT WOUND;  Surgeon: Samara Deist,               DPM;  Location: [facility];  Service: Podiatry;                Laterality: Right;  BMI    Body Mass Index:  31.74 kg/m      Reproductive/Obstetrics negative OB ROS                             Anesthesia Physical  Anesthesia Plan  ASA: IV  Anesthesia Plan: General   Post-op Pain Management:    Induction: Intravenous  PONV Risk Score and Plan: 1 and Dexamethasone, Ondansetron, Midazolam and Treatment may vary due to age or medical condition  Airway Management Planned: LMA  Additional Equipment:   Intra-op Plan:   Post-operative Plan:   Informed Consent: I have reviewed the patients History and Physical, chart, labs and discussed the procedure including the risks, benefits and alternatives for the proposed anesthesia with the patient or authorized representative who has indicated his/her understanding and acceptance.     Dental Advisory Given  Plan Discussed with: Anesthesiologist and CRNA  Anesthesia Plan Comments:        Anesthesia Quick Evaluation

## 2018-12-22 NOTE — Progress Notes (Signed)
Established hemodialysis patient known at [facility] TTS 11:40, patient rides with [facility]. Please contact me directly with dialysis placement concerns.  Elvera Bicker Dialysis Coordinator [phone]

## 2018-12-22 NOTE — Progress Notes (Signed)
Post Tx summary   12/22/18 1315  Hand-Off documentation  Report given to (Full Name) Madilyn Fireman RN  Report received from (Full Name) Newt Minion RN   Vital Signs  Temp 98.8 F (37.1 C)  Temp Source Oral  Pulse Rate 84  Pulse Rate Source Monitor  Resp (!) 24  BP 120/69  BP Location Left Arm  BP Method Automatic  Patient Position (if appropriate) Lying  Oxygen Therapy  SpO2 100 %  O2 Device Room Air  Post-Hemodialysis Assessment  Rinseback Volume (mL) 250 mL  KECN 67.3 V  Dialyzer Clearance Lightly streaked  Duration of HD Treatment -hour(s) 3 hour(s)  Hemodialysis Intake (mL) 500 mL  UF Total -Machine (mL) 3002 mL  Net UF (mL) 2502 mL  Tolerated HD Treatment Yes  Post-Hemodialysis Comments  (Pt tolerated well the tx)  AVG/AVF Arterial Site Held (minutes)  (NA)  Hemodialysis Catheter Right Subclavian Double lumen Permanent (Tunneled)  Placement Date: (c) 11/24/18   Orientation: Right  Access Location: Subclavian  Hemodialysis Catheter Type: Double lumen Permanent (Tunneled)  Site Condition No complications  Blue Lumen Status Saline locked  Red Lumen Status Saline locked  Purple Lumen Status Saline locked  Catheter fill solution Heparin 1000 units/ml  Catheter fill volume (Arterial) 1.5 cc  Catheter fill volume (Venous) 1.5  Dressing Type Gauze/Drain sponge  Dressing Status Clean;Dry;Intact  Interventions Dressing reinforced  Drainage Description None  Post treatment catheter status Capped and Clamped

## 2018-12-22 NOTE — Consult Note (Signed)
ORTHOPAEDIC CONSULTATION  REQUESTING PHYSICIAN: Vaughan Basta, *  Chief Complaint: Right heel ulceration  HPI: Paul [name]. is a 53 y.o. male who complains of worsening right heel ulceration.  Patient has undergone multiple procedures in regards to attempted limb salvage on his right foot.  Most recently underwent partial calcanectomy debridement of the heel with local wound care at home.  Had been instructed on absolute nonweightbearing to his right foot.  Was found to have worsening drainage and necrosis to the surrounding skin tissue by home health nurse.  Sent to the ER and admitted for further evaluation with infected wound.  Patient diabetic with coronary artery disease peripheral vascular disease and neuropathy.  Past Medical History:  Diagnosis Date  . Anemia   . Cardiac defibrillator in place    a. Biotronik LUmax 540 DRT, (ser # [id number]).  . Carotid arterial disease ([place])    a. s/p prior LICA stenting;  b. 05/6943 Carotid U/S: 40-59% bilat ICA stenosis. Patent LICA stent.  . CHF (congestive heart failure) ([place])   . CKD (chronic kidney disease), stage III ([place])   . Coronary artery disease    a. 2010 s/p CABG x 3.  . DDD (degenerative disc disease), lumbosacral    L5-S1  . Diabetes ([place])    Lantus at bedtime  . Gangrene of toe of right foot ([place])   . Gout of left hand 10/06/2016  . HFrEF (heart failure with reduced ejection fraction) ([place])    a. 07/2016 Echo: EF 25-30%, diff HK, mild MR, mildly dil LA, mod reduced RV fxn, PASP 30mmHg.  Marland Kitchen Hypertension    takes Coreg daily  . Ischemic cardiomyopathy    a. 07/2016 Echo: EF 25-30%, diff HK.  . Myocardial infarction ([place]) 2009  . Peripheral vascular disease ([place])   . Sleep apnea    sleep study yr ago-unable to afford cpap  . Stroke [facility]) 09   no weakness   Past Surgical History:  Procedure Laterality Date  . AMPUTATION TOE Right 08/22/2016   Procedure: AMPUTATION TOE;  Surgeon: Samara Deist, DPM;  Location:  [facility];  Service: Podiatry;  Laterality: Right;  . AMPUTATION TOE Right 10/09/2016   Procedure: AMPUTATION TOE-RIGHT 2ND MPJ;  Surgeon: Samara Deist, DPM;  Location: [facility];  Service: Podiatry;  Laterality: Right;  . APLIGRAFT PLACEMENT Right 10/09/2016   Procedure: APLIGRAFT PLACEMENT;  Surgeon: Samara Deist, DPM;  Location: [facility];  Service: Podiatry;  Laterality: Right;  . CALCANEAL OSTEOTOMY Bilateral 07/28/2018   Procedure: RIGHT CALCANECTOMY BILATERAL DEBRIDEMENT OF ULCERS ON HEELS;  Surgeon: Albertine Patricia, DPM;  Location: [facility];  Service: Podiatry;  Laterality: Bilateral;  . CARDIAC DEFIBRILLATOR PLACEMENT  2011  . CAROTID ENDARTERECTOMY Left   . CHOLECYSTECTOMY  2010  . CORONARY ARTERY BYPASS GRAFT  2010   CABG x 3   . DIALYSIS/PERMA CATHETER INSERTION N/A 10/07/2018   Procedure: DIALYSIS/PERMA CATHETER INSERTION;  Surgeon: Katha Cabal, MD;  Location: [place] CV LAB;  Service: Cardiovascular;  Laterality: N/A;  . GRAFT APPLICATION Right 0/38/8828   Procedure: FULL THICKNESS SKIN GRAFT-RIGHT FOOT;  Surgeon: Algernon Huxley, MD;  Location: [facility];  Service: Vascular;  Laterality: Right;  . HIP SURGERY Right 1994  . INCISION AND DRAINAGE Right 09/08/2018   Procedure: INCISION AND DRAINAGE - [place] OF DEFECTIVE SKIN, SOFT TISSUE AND BONE;  Surgeon: Albertine Patricia, DPM;  Location: [facility];  Service: Podiatry;  Laterality: Right;  . INCISION AND DRAINAGE OF WOUND Right 08/22/2016  Procedure: IRRIGATION AND DEBRIDEMENT WOUND and wound vac placement;  Surgeon: Samara Deist, DPM;  Location: [facility];  Service: Podiatry;  Laterality: Right;  . IRRIGATION AND DEBRIDEMENT ABSCESS Right 11/24/2018   Procedure: IRRIGATION AND DEBRIDEMENT ABSCESS RIGHT FOOT, COMPLICATED, DIABETIC;  Surgeon: Albertine Patricia, DPM;  Location: [facility];  Service: Podiatry;  Laterality: Right;  . LOWER EXTREMITY ANGIOGRAPHY Right 08/24/2016   Procedure: Lower Extremity Angiography;  Surgeon:  Algernon Huxley, MD;  Location: [place] CV LAB;  Service: Cardiovascular;  Laterality: Right;  . LOWER EXTREMITY ANGIOGRAPHY Right 07/27/2018   Procedure: RIGHT Lower Extremity Angiography;  Surgeon: Algernon Huxley, MD;  Location: [place] CV LAB;  Service: Cardiovascular;  Laterality: Right;  . LOWER EXTREMITY ANGIOGRAPHY Right 09/09/2018   Procedure: Lower Extremity Angiography;  Surgeon: Katha Cabal, MD;  Location: [place] CV LAB;  Service: Cardiovascular;  Laterality: Right;  . MASS EXCISION Right 07/18/2014   Procedure: EXCISION HETEROTOPIC BONE RIGHT HIP;  Surgeon: Frederik Pear, MD;  Location: [place];  Service: Orthopedics;  Laterality: Right;  . Open Heart Surgery  2010   x 3  . WOUND DEBRIDEMENT Right 10/09/2016   Procedure: DEBRIDEMENT WOUND;  Surgeon: Samara Deist, DPM;  Location: [facility];  Service: Podiatry;  Laterality: Right;   Social History   Socioeconomic History  . Marital status: Legally Separated    Spouse name: Not on file  . Number of children: Not on file  . Years of education: Not on file  . Highest education level: Not on file  Occupational History  . Not on file  Social Needs  . Financial resource strain: Not very hard  . Food insecurity    Worry: Sometimes true    Inability: Never true  . Transportation needs    Medical: No    Non-medical: No  Tobacco Use  . Smoking status: Never Smoker  . Smokeless tobacco: Never Used  Substance and Sexual Activity  . Alcohol use: No  . Drug use: No  . Sexual activity: Not on file  Lifestyle  . Physical activity    Days per week: Not on file    Minutes per session: Not on file  . Stress: Not at all  Relationships  . Social Herbalist on phone: Not on file    Gets together: Not on file    Attends religious service: Not on file    Active member of club or organization: Not on file    Attends meetings of clubs or organizations: Not on file    Relationship status: Not on file  Other  Topics Concern  . Not on file  Social History Narrative  . Not on file   Family History  Problem Relation Age of Onset  . Hypertension Other   . Diabetes Other   . Diabetes Father   . Hyperlipidemia Father   . Hypertension Father   . Hyperlipidemia Sister   . Hypertension Sister   . Diabetes Sister   . Diabetes Brother   . Hypertension Brother   . Hyperlipidemia Brother    Allergies  Allergen Reactions  . Other Other (See Comments)    Cardiac Problems. Pt states he tolerates Toradol. Due to kidney and heart problems per pt  . Ibuprofen Other (See Comments)    Heart problems  . Baclofen Other (See Comments)  . Metformin Diarrhea  . Nsaids     Due to kidney and heart problems per pt   Prior to Admission  medications   Medication Sig Start Date End Date Taking? Authorizing Provider  allopurinol (ZYLOPRIM) 100 MG tablet Take 1 tablet (100 mg total) by mouth daily. 08/02/18  Yes Fritzi Mandes, MD  amiodarone (PACERONE) 100 MG tablet Take 100 mg by mouth daily.  01/15/18  Yes [provider]  aspirin EC 81 MG tablet Take 1 tablet (81 mg total) by mouth daily. 10/21/16  Yes Theora Gianotti, NP  carvedilol (COREG) 3.125 MG tablet Take 1 tablet (3.125 mg total) by mouth 2 (two) times daily. 05/29/18  Yes Loletha Grayer, MD  clopidogrel (PLAVIX) 75 MG tablet Take 1 tablet (75 mg total) by mouth daily with breakfast. 09/12/18  Yes Mayo, Pete Pelt, MD  dicyclomine (BENTYL) 10 MG capsule Take 1 capsule (10 mg total) by mouth 4 (four) times daily -  before meals and at bedtime. 10/11/18  Yes Wieting, [name], MD  gabapentin (NEURONTIN) 100 MG capsule Take 1 capsule (100 mg total) by mouth 3 (three) times daily. 10/11/18  Yes Wieting, [name], MD  HYDROcodone-acetaminophen (NORCO/VICODIN) 5-325 MG tablet Take 1 tablet by mouth every 6 (six) hours as needed for severe pain. 12/01/18  Yes Sudini, Alveta Heimlich, MD  calcium acetate (PHOSLO) 667 MG capsule Take 1,334 mg by mouth 3 (three)  times daily. 12/20/18   [provider]  cefTAZidime (FORTAZ) IVPB Inject 1-2 g into the vein Every Tuesday,Thursday,and Saturday with dialysis for 25 days. Give 1gm with HD on Tuesdays and Thursdays and 2gm on Saturdays Indication:  Osteomyelitis Last Day of Therapy:  12/28/2018 Labs - Once weekly: CK, CBC/D and CMP, 12/03/18 12/28/18  Hillary Bow, MD  daptomycin (CUBICIN) IVPB Inject 750-1,000 mg into the vein Every Tuesday,Thursday,and Saturday with dialysis. Give 750mg  after HD on Tuesdays and Thursdays and on Saturdays give 1000mg  Indication:  Osteomyelitis Last Day of Therapy:  12/28/2018 Labs - Once weekly:  CBC/D, CMP, and CPK 12/01/18   Sudini, Srikar, MD  linezolid (ZYVOX) 600 MG tablet Take 1 tablet (600 mg total) by mouth every 12 (twelve) hours. Patient not taking: Reported on 12/21/2018 12/01/18   Hillary Bow, MD  loperamide (IMODIUM) 2 MG capsule Take 1 capsule (2 mg total) by mouth every 8 (eight) hours as needed for diarrhea or loose stools. 10/11/18   Loletha Grayer, MD  predniSONE (DELTASONE) 5 MG tablet Take 1 tablet (5 mg total) by mouth daily with breakfast. Patient not taking: Reported on 12/21/2018 08/02/18   Fritzi Mandes, MD  torsemide (DEMADEX) 20 MG tablet Take 40 mg (two tablets) on days that you do not have dialysis Patient not taking: Reported on 10/31/2018 10/11/18   Loletha Grayer, MD   Dg Foot Complete Right  Result Date: 12/21/2018 CLINICAL DATA:  Torquing of tissue of the foot. Foot surgery 2 weeks prior with debridement EXAM: RIGHT FOOT COMPLETE - 3+ VIEW COMPARISON:  Radiograph 11/21/2018 FINDINGS: There is new gas tracking along the plantar surface of the calcaneus. This tract extends from the most posterior margin of the calcaneus and extends anteriorly to connect with a large ulceration along the undersurface of the calcaneus. There is no discrete bony erosion along the calcaneal margin. Prior osteotomies of the metatarsals 3, 4 and 5. Prior amputation of  the first and second ray at the metatarsophalangeal joints. IMPRESSION: 1. New gas tract extending from the posterior margin of the undersurface of the calcaneus to communicate with a large ulceration. 2. No clear evidence of osseous erosion. Electronically Signed   By: Helane Gunther.D.  On: 12/21/2018 15:45    Positive ROS: All other systems have been reviewed and were otherwise negative with the exception of those mentioned in the HPI and as above.  12 point ROS was performed.  Physical Exam: General: Alert and oriented.  No apparent distress.  Vascular:  Left foot:Dorsalis Pedis:  diminished Posterior Tibial:  diminished  Right foot: Dorsalis Pedis:  diminished Posterior Tibial:  diminished  Neuro:absent protective sensation  Derm: Left foot without open ulceration or wound.  Right heel with large ulceration to the plantar aspect of the calcaneus.  Exposed down to bone at this time.  Some surrounding necrotic tissue was noted to the wound.  This does probe deep and posterior to the calcaneus extending towards the Achilles region.  Scant purulent and bloody drainage noted from the area at this time.   Ortho/MS: Edema to the right lower extremity is noted.  Range of motion of the left lower extremity.  Assessment: Nonhealing diabetic ulcer plantar right heel with surrounding necrosis and exposure of bone. Diabetes with neuropathy Referral vascular disease  Plan: I do not believe there is much limb salvage left in regards to this wound.  He is undergone multiple debridements and despite that he is attempt these continue to progress and become worse.  There is surrounding necrotic tissue to the ulcer at this time.  Bone is exposed.  The ulceration measures approximately 5 x 4 cm.  It does probe to the posterior aspect of the calcaneus.  A deep wound culture was performed by myself today.  I had a long detailed discussion in regards to attempted salvage as I do not believe there is  much in regards to salvage left.  His best option is below the knee amputation.  We will consult vascular surgery.  His biggest concern is rehabilitation and the ability to obtain a prosthetic limb postoperatively.  I have asked care management to look into this as well.  I will see him back while in the hospital for continued follow-up until below the knee amputation is performed.    Elesa Hacker, DPM [phone]   12/22/2018 7:59 AM

## 2018-12-22 NOTE — Consult Note (Signed)
NAME: Paul [name].  DOB: 04-Nov-1965  MRN: [id number]  Date/Time: 12/22/2018 7:12 PM  REQUESTING PROVIDER: Dr. Anselm Jungling Subjective:  REASON FOR CONSULT: Right foot wound ? Paul [name]. is a 53 y.o. male with a history of diabetes mellitus, peripheral vascular disease, coronary artery disease, status post CABG and ICD placement end-stage renal disease on dialysis, chronic right heel wound with osteomyelitis is admitted with worsening of the right heel wound. Patient has had some chills last week in the dialysis center and also had a fever He has some nausea He has no cough or shortness of breath or chest pain He has pain and swelling of the right leg He has been getting IV antibiotics during the dialysis for the last 3 weeks.   Patient has been battling a right foot infection for the past year or so.  He has been on multiple courses of antibiotics.  He was recently in hospital between 11/21/2018 until 12/01/2018 and was discharged on daptomycin and ceftazidime which she was getting in the dialysis center.  He is still on it and his last day supposed to be 01/03/2019. During that hospitalization Dr. Elvina Mattes had taken him to the OR and done debridement of the necrotic wound on the right heel and also removal of prominent bone.  Cultures were sent at that grew MRSA, Pseudomonas and enterococcus.  The biopsy was positive for osteomyelitis.   Past Medical History:  Diagnosis Date  . Anemia   . Cardiac defibrillator in place    a. Biotronik LUmax 540 DRT, (ser # [id number]).  . Carotid arterial disease ([place])    a. s/p prior LICA stenting;  b. 11/4126 Carotid U/S: 40-59% bilat ICA stenosis. Patent LICA stent.  . CHF (congestive heart failure) ([place])   . CKD (chronic kidney disease), stage III ([place])   . Coronary artery disease    a. 2010 s/p CABG x 3.  . DDD (degenerative disc disease), lumbosacral    L5-S1  . Diabetes ([place])    Lantus at bedtime  . Gangrene of toe of right foot ([place])   . Gout  of left hand 10/06/2016  . HFrEF (heart failure with reduced ejection fraction) ([place])    a. 07/2016 Echo: EF 25-30%, diff HK, mild MR, mildly dil LA, mod reduced RV fxn, PASP 27mmHg.  Marland Kitchen Hypertension    takes Coreg daily  . Ischemic cardiomyopathy    a. 07/2016 Echo: EF 25-30%, diff HK.  . Myocardial infarction ([place]) 2009  . Peripheral vascular disease ([place])   . Sleep apnea    sleep study yr ago-unable to afford cpap  . Stroke [facility]) 09   no weakness    Past Surgical History:  Procedure Laterality Date  . AMPUTATION TOE Right 08/22/2016   Procedure: AMPUTATION TOE;  Surgeon: Samara Deist, DPM;  Location: [facility];  Service: Podiatry;  Laterality: Right;  . AMPUTATION TOE Right 10/09/2016   Procedure: AMPUTATION TOE-RIGHT 2ND MPJ;  Surgeon: Samara Deist, DPM;  Location: [facility];  Service: Podiatry;  Laterality: Right;  . APLIGRAFT PLACEMENT Right 10/09/2016   Procedure: APLIGRAFT PLACEMENT;  Surgeon: Samara Deist, DPM;  Location: [facility];  Service: Podiatry;  Laterality: Right;  . CALCANEAL OSTEOTOMY Bilateral 07/28/2018   Procedure: RIGHT CALCANECTOMY BILATERAL DEBRIDEMENT OF ULCERS ON HEELS;  Surgeon: Albertine Patricia, DPM;  Location: [facility];  Service: Podiatry;  Laterality: Bilateral;  . CARDIAC DEFIBRILLATOR PLACEMENT  2011  . CAROTID ENDARTERECTOMY Left   . CHOLECYSTECTOMY  2010  . CORONARY  ARTERY BYPASS GRAFT  2010   CABG x 3   . DIALYSIS/PERMA CATHETER INSERTION N/A 10/07/2018   Procedure: DIALYSIS/PERMA CATHETER INSERTION;  Surgeon: Katha Cabal, MD;  Location: [place] [facility];  Service: Cardiovascular;  Laterality: N/A;  . GRAFT APPLICATION Right 11/26/3662   Procedure: FULL THICKNESS SKIN GRAFT-RIGHT FOOT;  Surgeon: Algernon Huxley, MD;  Location: [facility];  Service: Vascular;  Laterality: Right;  . HIP SURGERY Right 1994  . INCISION AND DRAINAGE Right 09/08/2018   Procedure: INCISION AND DRAINAGE - [place] OF DEFECTIVE SKIN, SOFT TISSUE AND BONE;  Surgeon: Albertine Patricia, DPM;  Location: [facility];  Service: Podiatry;  Laterality: Right;  . INCISION AND DRAINAGE OF WOUND Right 08/22/2016   Procedure: IRRIGATION AND DEBRIDEMENT WOUND and wound vac placement;  Surgeon: Samara Deist, DPM;  Location: [facility];  Service: Podiatry;  Laterality: Right;  . IRRIGATION AND DEBRIDEMENT ABSCESS Right 11/24/2018   Procedure: IRRIGATION AND DEBRIDEMENT ABSCESS RIGHT FOOT, COMPLICATED, DIABETIC;  Surgeon: Albertine Patricia, DPM;  Location: [facility];  Service: Podiatry;  Laterality: Right;  . LOWER EXTREMITY ANGIOGRAPHY Right 08/24/2016   Procedure: Lower Extremity Angiography;  Surgeon: Algernon Huxley, MD;  Location: [place] [facility];  Service: Cardiovascular;  Laterality: Right;  . LOWER EXTREMITY ANGIOGRAPHY Right 07/27/2018   Procedure: RIGHT Lower Extremity Angiography;  Surgeon: Algernon Huxley, MD;  Location: [place] [facility];  Service: Cardiovascular;  Laterality: Right;  . LOWER EXTREMITY ANGIOGRAPHY Right 09/09/2018   Procedure: Lower Extremity Angiography;  Surgeon: Katha Cabal, MD;  Location: [place] [facility];  Service: Cardiovascular;  Laterality: Right;  . MASS EXCISION Right 07/18/2014   Procedure: EXCISION HETEROTOPIC BONE RIGHT HIP;  Surgeon: Frederik Pear, MD;  Location: [place];  Service: Orthopedics;  Laterality: Right;  . Open Heart Surgery  2010   x 3  . WOUND DEBRIDEMENT Right 10/09/2016   Procedure: DEBRIDEMENT WOUND;  Surgeon: Samara Deist, DPM;  Location: [facility];  Service: Podiatry;  Laterality: Right;    Social History   Socioeconomic History  . Marital status: Legally Separated    Spouse name: Not on file  . Number of children: Not on file  . Years of education: Not on file  . Highest education level: Not on file  Occupational History  . Not on file  Social Needs  . Financial resource strain: Not very hard  . Food insecurity    Worry: Sometimes true    Inability: Never true  . Transportation needs    Medical: No     Non-medical: No  Tobacco Use  . Smoking status: Never Smoker  . Smokeless tobacco: Never Used  Substance and Sexual Activity  . Alcohol use: No  . Drug use: No  . Sexual activity: Not on file  Lifestyle  . Physical activity    Days per week: Not on file    Minutes per session: Not on file  . Stress: Not at all  Relationships  . Social Herbalist on phone: Not on file    Gets together: Not on file    Attends religious service: Not on file    Active member of club or organization: Not on file    Attends meetings of clubs or organizations: Not on file    Relationship status: Not on file  . Intimate partner violence    Fear of current or ex partner: Not on file    Emotionally abused: Not on file  Physically abused: Not on file    Forced sexual activity: Not on file  Other Topics Concern  . Not on file  Social History Narrative  . Not on file    Family History  Problem Relation Age of Onset  . Hypertension Other   . Diabetes Other   . Diabetes Father   . Hyperlipidemia Father   . Hypertension Father   . Hyperlipidemia Sister   . Hypertension Sister   . Diabetes Sister   . Diabetes Brother   . Hypertension Brother   . Hyperlipidemia Brother    Allergies  Allergen Reactions  . Other Other (See Comments)    Cardiac Problems. Pt states he tolerates Toradol. Due to kidney and heart problems per pt  . Ibuprofen Other (See Comments)    Heart problems  . Baclofen Other (See Comments)  . Metformin Diarrhea  . Nsaids     Due to kidney and heart problems per pt   ? Current Facility-Administered Medications  Medication Dose Route Frequency Provider Last Rate Last Dose  . 0.9 %  sodium chloride infusion  250 mL Intravenous PRN Dustin Flock, MD      . acetaminophen (TYLENOL) tablet 650 mg  650 mg Oral Q6H PRN Dustin Flock, MD       Or  . acetaminophen (TYLENOL) suppository 650 mg  650 mg Rectal Q6H PRN Dustin Flock, MD      . allopurinol (ZYLOPRIM)  tablet 100 mg  100 mg Oral Daily Dustin Flock, MD   100 mg at 12/22/18 0844  . amiodarone (PACERONE) tablet 100 mg  100 mg Oral Daily Dustin Flock, MD   100 mg at 12/22/18 0846  . calcium acetate (PHOSLO) capsule 1,334 mg  1,334 mg Oral TID WC Dustin Flock, MD   1,334 mg at 12/22/18 0843  . carvedilol (COREG) tablet 3.125 mg  3.125 mg Oral BID Dustin Flock, MD   3.125 mg at 12/22/18 0844  . cefTAZidime (FORTAZ) 1 g in sodium chloride 0.9 % 100 mL IVPB  1 g Intravenous Once per day on Tue Thu Patel, Shreyang, MD      . Derrill Memo ON 12/24/2018] cefTAZidime (FORTAZ) 2 g in sodium chloride 0.9 % 100 mL IVPB  2 g Intravenous Q Sat-HD Dustin Flock, MD      . Chlorhexidine Gluconate Cloth 2 % PADS 6 each  6 each Topical Q0600 Kolluru, Sarath, MD      . Derrill Memo ON 12/24/2018] DAPTOmycin (CUBICIN) 1,000 mg in sodium chloride 0.9 % IVPB  1,000 mg Intravenous Q Sat-HD Dustin Flock, MD      . DAPTOmycin (CUBICIN) 750 mg in sodium chloride 0.9 % IVPB  750 mg Intravenous Once per day on Tue Thu Patel, Chana Bode, MD 230 mL/hr at 12/22/18 1800 750 mg at 12/22/18 1800  . dicyclomine (BENTYL) capsule 10 mg  10 mg Oral TID AC & HS Dustin Flock, MD   10 mg at 12/22/18 0844  . ferrous sulfate tablet 325 mg  325 mg Oral BID WC Vaughan Basta, MD      . gabapentin (NEURONTIN) capsule 100 mg  100 mg Oral TID Dustin Flock, MD   100 mg at 12/22/18 1532  . heparin injection 5,000 Units  5,000 Units Subcutaneous Q8H Dustin Flock, MD      . HYDROcodone-acetaminophen (NORCO/VICODIN) 5-325 MG per tablet 1 tablet  1 tablet Oral Q6H PRN Dustin Flock, MD   1 tablet at 12/22/18 1532  . loperamide (IMODIUM) capsule 2 mg  2  mg Oral Q8H PRN Dustin Flock, MD      . ondansetron [facility]) tablet 4 mg  4 mg Oral Q6H PRN Dustin Flock, MD       Or  . ondansetron [facility]) injection 4 mg  4 mg Intravenous Q6H PRN Dustin Flock, MD      . predniSONE (DELTASONE) tablet 5 mg  5 mg Oral Q breakfast Dustin Flock, MD   5 mg at 12/22/18 0857  . sodium chloride flush (NS) 0.9 % injection 3 mL  3 mL Intravenous Q12H Dustin Flock, MD   3 mL at 12/21/18 2143  . sodium chloride flush (NS) 0.9 % injection 3 mL  3 mL Intravenous PRN Dustin Flock, MD         Abtx:  Anti-infectives (From admission, onward)   Start     Dose/Rate Route Frequency Ordered Stop   12/24/18 1800  DAPTOmycin (CUBICIN) 1,000 mg in sodium chloride 0.9 % IVPB     1,000 mg 240 mL/hr over 30 Minutes Intravenous Every Sat (Hemodialysis) 12/21/18 2037     12/24/18 1200  cefTAZidime (FORTAZ) 2 g in sodium chloride 0.9 % 100 mL IVPB     2 g 200 mL/hr over 30 Minutes Intravenous Every Sat (Hemodialysis) 12/21/18 2032     12/22/18 1800  cefTAZidime (FORTAZ) 1 g in sodium chloride 0.9 % 100 mL IVPB  Status:  Discontinued     1 g 200 mL/hr over 30 Minutes Intravenous Once per day on Tue Thu 12/21/18 2029 12/21/18 2033   12/22/18 1800  cefTAZidime (FORTAZ) 1 g in sodium chloride 0.9 % 100 mL IVPB     1 g 200 mL/hr over 30 Minutes Intravenous Once per day on Tue Thu 12/21/18 2032     12/22/18 1800  DAPTOmycin (CUBICIN) 750 mg in sodium chloride 0.9 % IVPB     750 mg 230 mL/hr over 30 Minutes Intravenous Once per day on Tue Thu 12/21/18 2037     12/22/18 1200  cefTAZidime (FORTAZ) IVPB  Status:  Discontinued    Note to Pharmacy: Give 1gm with HD on Tuesdays and Thursdays and 2gm on Saturdays Indication:  Osteomyelitis Last Day of Therapy:  12/28/2018 Labs - Once weekly: CK, CBC/D and CMP,     1-2 g Intravenous Every T-Th-Sa (Hemodialysis) 12/21/18 2024 12/21/18 2028   12/22/18 1200  daptomycin (CUBICIN) IVPB  Status:  Discontinued    Note to Pharmacy: Give 750mg  after HD on Tuesdays and Thursdays and on Saturdays give 1000mg  Indication:  Osteomyelitis Last Day of Therapy:  12/28/2018 Labs - Once weekly:  CBC/D, CMP, and CPK     750-1,000 mg Intravenous Every T-Th-Sa (Hemodialysis) 12/21/18 2024 12/21/18 2034   12/22/18 1200   cefTAZidime (FORTAZ) IVPB  Status:  Discontinued    Note to Pharmacy: Give 1gm with HD on Tuesdays and Thursdays and 2gm on Saturdays Indication:  Osteomyelitis Last Day of Therapy:  12/28/2018 Labs - Once weekly: CK, CBC/D and CMP,     1-2 g Intravenous Every T-Th-Sa (Hemodialysis) 12/21/18 2028 12/21/18 2028      REVIEW OF SYSTEMS:  Const:  fever, chills,  weight loss Eyes: negative diplopia or visual changes, negative eye pain ENT: negative coryza, negative sore throat Resp: negative cough, hemoptysis, dyspnea Cards: negative for chest pain, palpitations, lower extremity edema GU: negative for frequency, dysuria and hematuria GI: Negative for abdominal pain, diarrhea, bleeding, constipation Skin: negative for rash and pruritus Heme: negative for easy bruising and gum/nose bleeding MS: generalized  weakness Neurolo:negative for headaches, dizziness, vertigo, memory problems  Psych: Anxious Endocrine: No polyuria Allergy/Immunology-as above: Objective:  VITALS:  BP 99/69   Pulse 80   Temp 98.3 F (36.8 C) (Oral)   Resp (!) 24   Ht 5\' 9"  (1.753 m)   Wt 90 kg   SpO2 97%   BMI 29.30 kg/m  PHYSICAL EXAM:  General: Alert, cooperative, no distress, appears stated age.  Head: Normocephalic, without obvious abnormality, atraumatic. Eyes: Conjunctivae clear, anicteric sclerae. Pupils are equal ENT Nares normal. No drainage or sinus tenderness. Lips, mucosa, and tongue normal. No Thrush Neck: Supple, symmetrical, no adenopathy, thyroid: non tender no carotid bruit and no JVD. Back: No CVA tenderness. Lungs: Clear to auscultation bilaterally. No Wheezing or Rhonchi. No rales. Heart S1-S2 Cardiac device site clean Sternal scar Abdomen: Soft, non-tender,not distended. Bowel sounds normal. No masses Extremities: Right heel  ulcer with necrosis at one edge.  Bone exposed foul-smelling discharge     Lymph: Cervical, supraclavicular normal. Neurologic: Grossly non-focal  Pertinent Labs Lab Results CBC    Component Value Date/Time   WBC 9.3 12/22/2018 0413   RBC 2.62 (L) 12/22/2018 0413   HGB 7.1 (L) 12/22/2018 0413   HCT 24.5 (L) 12/22/2018 0413   PLT 393 12/22/2018 0413   MCV 93.5 12/22/2018 0413   MCH 27.1 12/22/2018 0413   MCHC 29.0 (L) 12/22/2018 0413   RDW 17.9 (H) 12/22/2018 0413   LYMPHSABS 0.8 12/21/2018 1512   MONOABS 1.0 12/21/2018 1512   EOSABS 0.0 12/21/2018 1512   BASOSABS 0.0 12/21/2018 1512    CMP Latest Ref Rng & Units 12/22/2018 12/21/2018 11/29/2018  Glucose 70 - 99 mg/dL 106(H) 208(H) 239(H)  BUN 6 - 20 mg/dL 30(H) 28(H) 51(H)  Creatinine 0.61 - 1.24 mg/dL 8.31(H) 7.16(H) 7.36(H)  Sodium 135 - 145 mmol/L 140 137 136  Potassium 3.5 - 5.1 mmol/L 4.6 4.2 5.3(H)  Chloride 98 - 111 mmol/L 106 104 99  CO2 22 - 32 mmol/L 24 22 26   Calcium 8.9 - 10.3 mg/dL 7.9(L) 7.9(L) 8.5(L)  Total Protein 6.5 - 8.1 g/dL - 7.1 -  Total Bilirubin 0.3 - 1.2 mg/dL - 0.4 -  Alkaline Phos 38 - 126 U/L - 108 -  AST 15 - 41 U/L - 16 -  ALT 0 - 44 U/L - 11 -      Microbiology: Recent Results (from the past 240 hour(s))  SARS Coronavirus 2 [facility] order, Performed in [facility] hospital lab) Nasopharyngeal Nasopharyngeal Swab     Status: None   Collection Time: 12/21/18  5:14 PM   Specimen: Nasopharyngeal Swab  Result Value Ref Range Status   SARS Coronavirus 2 NEGATIVE NEGATIVE Final    Comment: (NOTE) If result is NEGATIVE SARS-CoV-2 target nucleic acids are NOT DETECTED. The SARS-CoV-2 RNA is generally detectable in upper and lower  respiratory specimens during the acute phase of infection. The lowest  concentration of SARS-CoV-2 viral copies this assay can detect is 250  copies / mL. A negative result does not preclude SARS-CoV-2 infection  and should not be used as the sole basis for treatment or other  patient management decisions.  A negative result may occur with  improper specimen collection / handling, submission of specimen other   than nasopharyngeal swab, presence of viral mutation(s) within the  areas targeted by this assay, and inadequate number of viral copies  (<250 copies / mL). A negative result must be combined with clinical  observations, patient history, and epidemiological  information. If result is POSITIVE SARS-CoV-2 target nucleic acids are DETECTED. The SARS-CoV-2 RNA is generally detectable in upper and lower  respiratory specimens dur ing the acute phase of infection.  Positive  results are indicative of active infection with SARS-CoV-2.  Clinical  correlation with patient history and other diagnostic information is  necessary to determine patient infection status.  Positive results do  not rule out bacterial infection or co-infection with other viruses. If result is PRESUMPTIVE POSTIVE SARS-CoV-2 nucleic acids MAY BE PRESENT.   A presumptive positive result was obtained on the submitted specimen  and confirmed on repeat testing.  While 2019 novel coronavirus  (SARS-CoV-2) nucleic acids may be present in the submitted sample  additional confirmatory testing may be necessary for epidemiological  and / or clinical management purposes  to differentiate between  SARS-CoV-2 and other Sarbecovirus currently known to infect humans.  If clinically indicated additional testing with an alternate test  methodology [phone]) is advised. The SARS-CoV-2 RNA is generally  detectable in upper and lower respiratory sp ecimens during the acute  phase of infection. The expected result is Negative. Fact Sheet for Patients:  StrictlyIdeas.no Fact Sheet for Healthcare Providers: BankingDealers.co.za This test is not yet approved or cleared by the [place] FDA and has been authorized for detection and/or diagnosis of SARS-CoV-2 by FDA under an Emergency Use Authorization (EUA).  This EUA will remain in effect (meaning this test can be used) for the duration of the  COVID-19 declaration under Section 564(b)(1) of the Act, 21 U.S.C. section 360bbb-3(b)(1), unless the authorization is terminated or revoked sooner. Performed at [facility] [place] [facility], [address] [place], [place] [address]   Aerobic/Anaerobic Culture (surgical/deep wound)     Status: None (Preliminary result)   Collection Time: 12/22/18  8:00 AM   Specimen: Wound  Result Value Ref Range Status   Specimen Description   Final    WOUND Performed at [facility], [address] [place], [place] [address]    Special Requests   Final    RIGHT FOOT Performed at [facility], [address] [place], [place] [address]    Gram Stain   Final    RARE WBC PRESENT, PREDOMINANTLY PMN FEW GRAM VARIABLE ROD Performed at [facility], [place] [address], [place],  [address]    Culture PENDING  Incomplete   Report Status PENDING  Incomplete    IMAGING RESULTS:  xray   Foot New gas tract extending from the posterior margin of the undersurface of the calcaneus to communicate with a large ulceration. 2. No clear evidence of osseous erosion                                                    I have personally reviewed the films ? Impression/Recommendation ? 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  We will add metronidazole for anaerobic coverage as the wound is necrotic. I am afraid this wound  is not going to heal with antibiotics.    He has been on multiple courses of prolonged antibiotics  Seen by Dr. Vickki Muff and he recommended BKA and the vascular team will be  taking him for BKA tomorrow.  .  Peripheral vascular disease  End-stage renal disease on dialysis  Coronary artery disease status post CABG  AICD  Right hip hardware  Diabetes mellitus   Patient is on chronic prednisone 5 mg which also contributes to impair wound healing.  _Anemia of chronic disease  _______________________________________________ Discussed with patient and husband nurse ? ____ Note:  This document was prepared using Systems analyst and may include unintentional dictation errors.

## 2018-12-22 NOTE — Progress Notes (Signed)
HD    12/22/18 0945  Vital Signs  Pulse Rate 80  Resp 16  BP (!) 141/85  BP Location Left Arm  BP Method Automatic  Patient Position (if appropriate) Lying  Oxygen Therapy  SpO2 100 %  O2 Device Room Air  During Hemodialysis Assessment  Blood Flow Rate (mL/min) 400 mL/min  Arterial Pressure (mmHg) -120 mmHg  Venous Pressure (mmHg) 60 mmHg  Transmembrane Pressure (mmHg) 60 mmHg  Ultrafiltration Rate (mL/min) 1140 mL/min  Dialysate Flow Rate (mL/min) 600 ml/min  Conductivity: Machine  14.1  HD Safety Checks Performed Yes  Intra-Hemodialysis Comments Progressing as prescribed (Pt is stable)  Tx started

## 2018-12-22 NOTE — Progress Notes (Signed)
Pt tolerated well the Tx UF met. Fluids removed 3L. BP post Tx WDL. No S.O.B or pain reported   12/22/18 1315  Hand-Off documentation  Report given to (Full Name) Madilyn Fireman RN  Report received from (Full Name) Newt Minion RN   Vital Signs  Temp 98.8 F (37.1 C)  Temp Source Oral  Pulse Rate 84  Pulse Rate Source Monitor  Resp (!) 24  BP 120/69  BP Location Left Arm  BP Method Automatic  Patient Position (if appropriate) Lying  Oxygen Therapy  SpO2 100 %  O2 Device Room Air  Post-Hemodialysis Assessment  Rinseback Volume (mL) 250 mL  KECN 67.3 V  Dialyzer Clearance Lightly streaked  Duration of HD Treatment -hour(s) 3 hour(s)  Hemodialysis Intake (mL) 500 mL  UF Total -Machine (mL) 3002 mL  Net UF (mL) 2502 mL  Tolerated HD Treatment Yes  Post-Hemodialysis Comments  (Pt tolerated well the tx)  AVG/AVF Arterial Site Held (minutes)  (NA)

## 2018-12-23 ENCOUNTER — Encounter: Payer: Self-pay | Admitting: Anesthesiology

## 2018-12-23 ENCOUNTER — Inpatient Hospital Stay: Payer: Medicare Other | Admitting: Anesthesiology

## 2018-12-23 ENCOUNTER — Encounter
Admission: EM | Disposition: A | Discharge status: Skilled Nursing Facility | Payer: Self-pay | Source: Home / Self Care | Attending: Internal Medicine

## 2018-12-23 DIAGNOSIS — M86371 Chronic multifocal osteomyelitis, right ankle and foot: Secondary | ICD-10-CM

## 2018-12-23 DIAGNOSIS — I739 Peripheral vascular disease, unspecified: Secondary | ICD-10-CM

## 2018-12-23 HISTORY — PX: AMPUTATION: SHX166

## 2018-12-23 LAB — BASIC METABOLIC PANEL
Anion gap: 7 (ref 5–15)
BUN: 22 mg/dL — ABNORMAL HIGH (ref 6–20)
CO2: 29 mmol/L (ref 22–32)
Calcium: 8.4 mg/dL — ABNORMAL LOW (ref 8.9–10.3)
Chloride: 102 mmol/L (ref 98–111)
Creatinine, Ser: 6.03 mg/dL — ABNORMAL HIGH (ref 0.61–1.24)
GFR calc Af Amer: 11 mL/min — ABNORMAL LOW (ref >60)
GFR calc non Af Amer: 10 mL/min — ABNORMAL LOW (ref >60)
Glucose, Bld: 133 mg/dL — ABNORMAL HIGH (ref 70–99)
Potassium: 4.6 mmol/L (ref 3.5–5.1)
Sodium: 138 mmol/L (ref 135–145)

## 2018-12-23 LAB — PROTIME-INR
INR: 1.1 (ref 0.8–1.2)
Prothrombin Time: 14.2 seconds (ref 11.4–15.2)

## 2018-12-23 LAB — CBC
HCT: 28.1 % — ABNORMAL LOW (ref 39.0–52.0)
HCT: 26.2 % — ABNORMAL LOW (ref 39.0–52.0)
Hemoglobin: 8.2 g/dL — ABNORMAL LOW (ref 13.0–17.0)
Hemoglobin: 7.7 g/dL — ABNORMAL LOW (ref 13.0–17.0)
MCH: 27 pg (ref 26.0–34.0)
MCH: 27.2 pg (ref 26.0–34.0)
MCHC: 29.2 g/dL — ABNORMAL LOW (ref 30.0–36.0)
MCHC: 29.4 g/dL — ABNORMAL LOW (ref 30.0–36.0)
MCV: 92.4 fL (ref 80.0–100.0)
MCV: 92.6 fL (ref 80.0–100.0)
Platelets: 432 10*3/uL — ABNORMAL HIGH (ref 150–400)
Platelets: 414 10*3/uL — ABNORMAL HIGH (ref 150–400)
RBC: 3.04 MIL/uL — ABNORMAL LOW (ref 4.22–5.81)
RBC: 2.83 MIL/uL — ABNORMAL LOW (ref 4.22–5.81)
RDW: 18.1 % — ABNORMAL HIGH (ref 11.5–15.5)
RDW: 18.3 % — ABNORMAL HIGH (ref 11.5–15.5)
WBC: 9.9 10*3/uL (ref 4.0–10.5)
WBC: 12.4 10*3/uL — ABNORMAL HIGH (ref 4.0–10.5)
nRBC: 0.2 % (ref 0.0–0.2)
nRBC: 0.2 % (ref 0.0–0.2)

## 2018-12-23 LAB — HEPATITIS B SURFACE ANTIGEN: Hepatitis B Surface Ag: NEGATIVE

## 2018-12-23 LAB — GLUCOSE, CAPILLARY
Glucose-Capillary: 116 mg/dL — ABNORMAL HIGH (ref 70–99)
Glucose-Capillary: 117 mg/dL — ABNORMAL HIGH (ref 70–99)
Glucose-Capillary: 100 mg/dL — ABNORMAL HIGH (ref 70–99)
Glucose-Capillary: 100 mg/dL — ABNORMAL HIGH (ref 70–99)
Glucose-Capillary: 149 mg/dL — ABNORMAL HIGH (ref 70–99)
Glucose-Capillary: 231 mg/dL — ABNORMAL HIGH (ref 70–99)

## 2018-12-23 LAB — MAGNESIUM: Magnesium: 2.3 mg/dL (ref 1.7–2.4)

## 2018-12-23 LAB — APTT: aPTT: 36 seconds (ref 24–36)

## 2018-12-23 LAB — MRSA PCR SCREENING: MRSA by PCR: NEGATIVE

## 2018-12-23 LAB — CK: Total CK: 29 U/L — ABNORMAL LOW (ref 49–397)

## 2018-12-23 SURGERY — AMPUTATION BELOW KNEE
Anesthesia: General | Laterality: Right

## 2018-12-23 MED ORDER — FENTANYL CITRATE (PF) 100 MCG/2ML IJ SOLN
INTRAMUSCULAR | Status: DC | PRN
  Administered 2018-12-23 (×2): 25 ug via INTRAVENOUS
  Administered 2018-12-23: 50 ug via INTRAVENOUS

## 2018-12-23 MED ORDER — BUPIVACAINE HCL (PF) 0.25 % IJ SOLN
INTRAMUSCULAR | Status: AC
  Filled 2018-12-23: qty 30

## 2018-12-23 MED ORDER — MIDAZOLAM HCL 2 MG/2ML IJ SOLN
INTRAMUSCULAR | Status: DC | PRN
  Administered 2018-12-23: 2 mg via INTRAVENOUS

## 2018-12-23 MED ORDER — FENTANYL CITRATE (PF) 100 MCG/2ML IJ SOLN
INTRAMUSCULAR | Status: AC
  Administered 2018-12-23: 25 ug via INTRAVENOUS
  Filled 2018-12-23: qty 2

## 2018-12-23 MED ORDER — FENTANYL CITRATE (PF) 100 MCG/2ML IJ SOLN
25.0000 ug | INTRAMUSCULAR | Status: DC | PRN
  Administered 2018-12-23 (×4): 25 ug via INTRAVENOUS

## 2018-12-23 MED ORDER — DEXAMETHASONE SODIUM PHOSPHATE 10 MG/ML IJ SOLN
INTRAMUSCULAR | Status: DC | PRN
  Administered 2018-12-23: 10 mg via INTRAVENOUS

## 2018-12-23 MED ORDER — ONDANSETRON HCL 4 MG/2ML IJ SOLN: 4.0000 mg | Freq: Once | INTRAMUSCULAR | Status: DC | PRN

## 2018-12-23 MED ORDER — SODIUM CHLORIDE (PF) 0.9 % IJ SOLN
INTRAMUSCULAR | Status: AC
  Filled 2018-12-23: qty 20

## 2018-12-23 MED ORDER — INSULIN ASPART 100 UNIT/ML ~~LOC~~ SOLN
0.0000 [IU] | Freq: Three times a day (TID) | SUBCUTANEOUS | Status: DC
  Administered 2018-12-24 – 2018-12-25 (×3): 2 [IU] via SUBCUTANEOUS
  Administered 2018-12-25 – 2018-12-26 (×2): 1 [IU] via SUBCUTANEOUS
  Administered 2018-12-28 (×3): 2 [IU] via SUBCUTANEOUS
  Administered 2018-12-29: 3 [IU] via SUBCUTANEOUS
  Administered 2018-12-29 (×2): 1 [IU] via SUBCUTANEOUS
  Administered 2018-12-30: 2 [IU] via SUBCUTANEOUS
  Administered 2018-12-30: 1 [IU] via SUBCUTANEOUS
  Administered 2018-12-30: 12:00:00 2 [IU] via SUBCUTANEOUS
  Administered 2018-12-31: 1 [IU] via SUBCUTANEOUS
  Administered 2019-01-01 (×3): 2 [IU] via SUBCUTANEOUS
  Administered 2019-01-02: 3 [IU] via SUBCUTANEOUS
  Administered 2019-01-02: 2 [IU] via SUBCUTANEOUS
  Filled 2018-12-23 (×21): qty 1

## 2018-12-23 MED ORDER — INSULIN ASPART 100 UNIT/ML ~~LOC~~ SOLN
0.0000 [IU] | Freq: Every day | SUBCUTANEOUS | Status: DC
  Administered 2018-12-23 – 2019-01-01 (×3): 2 [IU] via SUBCUTANEOUS
  Filled 2018-12-23 (×4): qty 1

## 2018-12-23 MED ORDER — ETOMIDATE 2 MG/ML IV SOLN
INTRAVENOUS | Status: AC
  Filled 2018-12-23: qty 10

## 2018-12-23 MED ORDER — VASOPRESSIN 20 UNIT/ML IV SOLN
INTRAVENOUS | Status: AC
  Filled 2018-12-23: qty 1

## 2018-12-23 MED ORDER — HYDROMORPHONE HCL 1 MG/ML IJ SOLN
0.5000 mg | INTRAMUSCULAR | Status: AC | PRN
  Administered 2018-12-23 (×4): 0.5 mg via INTRAVENOUS

## 2018-12-23 MED ORDER — ROCURONIUM BROMIDE 100 MG/10ML IV SOLN
INTRAVENOUS | Status: DC | PRN
  Administered 2018-12-23: 10 mg via INTRAVENOUS
  Administered 2018-12-23: 40 mg via INTRAVENOUS

## 2018-12-23 MED ORDER — FENTANYL CITRATE (PF) 100 MCG/2ML IJ SOLN
INTRAMUSCULAR | Status: AC
  Filled 2018-12-23: qty 2

## 2018-12-23 MED ORDER — SUGAMMADEX SODIUM 200 MG/2ML IV SOLN
INTRAVENOUS | Status: DC | PRN
  Administered 2018-12-23: 200 mg via INTRAVENOUS

## 2018-12-23 MED ORDER — ONDANSETRON HCL 4 MG/2ML IJ SOLN
INTRAMUSCULAR | Status: AC
  Filled 2018-12-23: qty 2

## 2018-12-23 MED ORDER — ETOMIDATE 2 MG/ML IV SOLN
INTRAVENOUS | Status: DC | PRN
  Administered 2018-12-23: 16 mg via INTRAVENOUS

## 2018-12-23 MED ORDER — LIDOCAINE HCL (PF) 2 % IJ SOLN
INTRAMUSCULAR | Status: AC
  Filled 2018-12-23: qty 10

## 2018-12-23 MED ORDER — DEXAMETHASONE SODIUM PHOSPHATE 10 MG/ML IJ SOLN
INTRAMUSCULAR | Status: AC
  Filled 2018-12-23: qty 1

## 2018-12-23 MED ORDER — HYDROMORPHONE HCL 1 MG/ML IJ SOLN
INTRAMUSCULAR | Status: AC
  Administered 2018-12-23: 0.5 mg via INTRAVENOUS
  Filled 2018-12-23: qty 1

## 2018-12-23 MED ORDER — BUPIVACAINE LIPOSOME 1.3 % IJ SUSP
INTRAMUSCULAR | Status: DC | PRN
  Administered 2018-12-23: 20 mL

## 2018-12-23 MED ORDER — BUPIVACAINE HCL (PF) 0.25 % IJ SOLN
INTRAMUSCULAR | Status: DC | PRN
  Administered 2018-12-23: 30 mL

## 2018-12-23 MED ORDER — SUGAMMADEX SODIUM 200 MG/2ML IV SOLN
INTRAVENOUS | Status: AC
  Filled 2018-12-23: qty 2

## 2018-12-23 MED ORDER — PHENYLEPHRINE HCL (PRESSORS) 10 MG/ML IV SOLN
INTRAVENOUS | Status: DC | PRN
  Administered 2018-12-23 (×5): 100 ug via INTRAVENOUS
  Administered 2018-12-23 (×2): 50 ug via INTRAVENOUS
  Administered 2018-12-23 (×2): 100 ug via INTRAVENOUS

## 2018-12-23 MED ORDER — HYDROMORPHONE HCL 1 MG/ML IJ SOLN
INTRAMUSCULAR | Status: AC
  Administered 2018-12-23: 17:00:00 0.5 mg via INTRAVENOUS
  Filled 2018-12-23: qty 1

## 2018-12-23 MED ORDER — ROCURONIUM BROMIDE 50 MG/5ML IV SOLN
INTRAVENOUS | Status: AC
  Filled 2018-12-23: qty 1

## 2018-12-23 MED ORDER — PROPOFOL 10 MG/ML IV BOLUS
INTRAVENOUS | Status: AC
  Filled 2018-12-23: qty 20

## 2018-12-23 MED ORDER — BUPIVACAINE LIPOSOME 1.3 % IJ SUSP
INTRAMUSCULAR | Status: AC
  Filled 2018-12-23: qty 20

## 2018-12-23 MED ORDER — MIDAZOLAM HCL 2 MG/2ML IJ SOLN
INTRAMUSCULAR | Status: AC
  Filled 2018-12-23: qty 2

## 2018-12-23 MED ORDER — ONDANSETRON HCL 4 MG/2ML IJ SOLN
INTRAMUSCULAR | Status: DC | PRN
  Administered 2018-12-23: 4 mg via INTRAVENOUS

## 2018-12-23 MED ORDER — LIDOCAINE HCL (CARDIAC) PF 100 MG/5ML IV SOSY
PREFILLED_SYRINGE | INTRAVENOUS | Status: DC | PRN
  Administered 2018-12-23: 100 mg via INTRAVENOUS

## 2018-12-23 SURGICAL SUPPLY — 41 items
BANDAGE ELASTIC 6 LF NS (GAUZE/BANDAGES/DRESSINGS) ×3 IMPLANT
BLADE SAGITTAL WIDE XTHICK NO (BLADE) IMPLANT
BLADE SAW SAG 25.4X90 (BLADE) ×3 IMPLANT
BNDG COHESIVE 4X5 TAN STRL (GAUZE/BANDAGES/DRESSINGS) ×3 IMPLANT
BNDG GAUZE 4.5X4.1 6PLY STRL (MISCELLANEOUS) ×6 IMPLANT
BRUSH SCRUB EZ  4% CHG (MISCELLANEOUS) ×2
BRUSH SCRUB EZ 4% CHG (MISCELLANEOUS) ×1 IMPLANT
CANISTER SUCT 1200ML W/VALVE (MISCELLANEOUS) ×3 IMPLANT
CHLORAPREP W/TINT 26ML (MISCELLANEOUS) ×3 IMPLANT
COVER WAND RF STERILE (DRAPES) ×3 IMPLANT
DRAIN PENROSE 1/4X12 LTX STRL (WOUND CARE) IMPLANT
DRAPE INCISE IOBAN 66X45 STRL (DRAPES) IMPLANT
DRSG TEGADERM 8X12 (GAUZE/BANDAGES/DRESSINGS) ×3 IMPLANT
ELECT CAUTERY BLADE 6.4 (BLADE) ×3 IMPLANT
ELECT REM PT RETURN 9FT ADLT (ELECTROSURGICAL) ×3
ELECTRODE REM PT RTRN 9FT ADLT (ELECTROSURGICAL) ×1 IMPLANT
GAUZE XEROFORM 1X8 LF (GAUZE/BANDAGES/DRESSINGS) ×6 IMPLANT
GLOVE BIO SURGEON STRL SZ7 (GLOVE) ×6 IMPLANT
GLOVE INDICATOR 7.5 STRL GRN (GLOVE) ×3 IMPLANT
GOWN STRL REUS W/ TWL LRG LVL3 (GOWN DISPOSABLE) ×2 IMPLANT
GOWN STRL REUS W/ TWL XL LVL3 (GOWN DISPOSABLE) ×1 IMPLANT
GOWN STRL REUS W/TWL LRG LVL3 (GOWN DISPOSABLE) ×4
GOWN STRL REUS W/TWL XL LVL3 (GOWN DISPOSABLE) ×2
HANDLE YANKAUER SUCT BULB TIP (MISCELLANEOUS) ×3 IMPLANT
KIT TURNOVER KIT A (KITS) ×3 IMPLANT
LABEL OR SOLS (LABEL) ×3 IMPLANT
NS IRRIG 1000ML POUR BTL (IV SOLUTION) ×3 IMPLANT
PACK EXTREMITY ARMC (MISCELLANEOUS) ×3 IMPLANT
PAD ABD DERMACEA PRESS 5X9 (GAUZE/BANDAGES/DRESSINGS) ×6 IMPLANT
PAD PREP 24X41 OB/GYN DISP (PERSONAL CARE ITEMS) ×3 IMPLANT
SPONGE LAP 18X18 RF (DISPOSABLE) ×3 IMPLANT
STAPLER SKIN PROX 35W (STAPLE) ×6 IMPLANT
STOCKINETTE M/LG 89821 (MISCELLANEOUS) ×3 IMPLANT
SUT ETHILON 2 0 FS 18 (SUTURE) ×3 IMPLANT
SUT SILK 2 0 (SUTURE) ×2
SUT SILK 2 0 SH (SUTURE) ×6 IMPLANT
SUT SILK 2-0 18XBRD TIE 12 (SUTURE) ×1 IMPLANT
SUT SILK 3 0 (SUTURE) ×4
SUT SILK 3-0 18XBRD TIE 12 (SUTURE) ×2 IMPLANT
SUT VIC AB 0 CT1 36 (SUTURE) ×9 IMPLANT
SUT VIC AB 2-0 CT1 (SUTURE) ×6 IMPLANT

## 2018-12-23 NOTE — Progress Notes (Signed)
[facility]  ROUNDING NOTE   Subjective:   Hemodialysis treatment yesterday. Tolerated treatment well. UF of 2.5 liters    Objective:  Vital signs in last 24 hours:  Temp:  [97.5 F (36.4 C)-98.8 F (37.1 C)] 97.5 F (36.4 C) (08/21 0910) Pulse Rate:  [80-84] 80 (08/21 0910) Resp:  [13-30] 18 (08/21 0910) BP: (90-135)/(53-87) 126/80 (08/21 0910) SpO2:  [97 %-100 %] 99 % (08/21 0910)  Weight change: -0.72 kg Filed Weights   12/21/18 1441 12/21/18 2031 12/22/18 0940  Weight: 90.7 kg 90.6 kg 90 kg    Intake/Output: I/O last 3 completed shifts: In: 740 [P.O.:640; IV Piggyback:100] Out: 2702 [Urine:200; Other:2502]   Intake/Output this shift:  No intake/output data recorded.  Physical Exam: General: No acute distress, laying in bed  Head: Normocephalic, atraumatic. Moist oral mucosal membranes  Eyes: Anicteric  Neck: Supple, trachea midline  Lungs:  Clear to auscultation, normal effort  Heart: regular  Abdomen:  Soft, nontender, bowel sounds present  Extremities: 1+ LE edema, right foot wrapped, wound vac in place  Neurologic: Awake, alert, following commands  Skin: No lesions  Access: IJ PermCath.    Basic Metabolic Panel: Recent Labs  Lab 12/21/18 1512 12/22/18 0413 12/23/18 0442  NA 137 140 138  K 4.2 4.6 4.6  CL 104 106 102  CO2 22 24 29   GLUCOSE 208* 106* 133*  BUN 28* 30* 22*  CREATININE 7.16* 8.31* 6.03*  CALCIUM 7.9* 7.9* 8.4*  MG  --   --  2.3    Liver Function Tests: Recent Labs  Lab 12/21/18 1512  AST 16  ALT 11  ALKPHOS 108  BILITOT 0.4  PROT 7.1  ALBUMIN 2.9*   No results for input(s): LIPASE, AMYLASE in the last 168 hours. No results for input(s): AMMONIA in the last 168 hours.  CBC: Recent Labs  Lab 12/21/18 1512 12/22/18 0413 12/23/18 0442  WBC 10.9* 9.3 9.9  NEUTROABS 9.0*  --   --   HGB 7.7* 7.1* 8.2*  HCT 27.1* 24.5* 28.1*  MCV 94.8 93.5 92.4  PLT 382 393 432*    Cardiac Enzymes: Recent Labs  Lab  12/22/18 1200 12/23/18 0442  CKTOTAL 34* 29*    BNP: Invalid input(s): POCBNP  CBG: Recent Labs  Lab 12/22/18 1349 12/22/18 1716 12/22/18 2124 12/23/18 0804 12/23/18 1158  GLUCAP 124* 228* 187* 116* 117*    Microbiology: Results for orders placed or performed during the hospital encounter of 12/21/18  SARS Coronavirus 2 [facility] order, Performed in [facility] hospital lab) Nasopharyngeal Nasopharyngeal Swab     Status: None   Collection Time: 12/21/18  5:14 PM   Specimen: Nasopharyngeal Swab  Result Value Ref Range Status   SARS Coronavirus 2 NEGATIVE NEGATIVE Final    Comment: (NOTE) If result is NEGATIVE SARS-CoV-2 target nucleic acids are NOT DETECTED. The SARS-CoV-2 RNA is generally detectable in upper and lower  respiratory specimens during the acute phase of infection. The lowest  concentration of SARS-CoV-2 viral copies this assay can detect is 250  copies / mL. A negative result does not preclude SARS-CoV-2 infection  and should not be used as the sole basis for treatment or other  patient management decisions.  A negative result may occur with  improper specimen collection / handling, submission of specimen other  than nasopharyngeal swab, presence of viral mutation(s) within the  areas targeted by this assay, and inadequate number of viral copies  (<250 copies / mL). A negative result must  be combined with clinical  observations, patient history, and epidemiological information. If result is POSITIVE SARS-CoV-2 target nucleic acids are DETECTED. The SARS-CoV-2 RNA is generally detectable in upper and lower  respiratory specimens dur ing the acute phase of infection.  Positive  results are indicative of active infection with SARS-CoV-2.  Clinical  correlation with patient history and other diagnostic information is  necessary to determine patient infection status.  Positive results do  not rule out bacterial infection or co-infection with other viruses. If  result is PRESUMPTIVE POSTIVE SARS-CoV-2 nucleic acids MAY BE PRESENT.   A presumptive positive result was obtained on the submitted specimen  and confirmed on repeat testing.  While 2019 novel coronavirus  (SARS-CoV-2) nucleic acids may be present in the submitted sample  additional confirmatory testing may be necessary for epidemiological  and / or clinical management purposes  to differentiate between  SARS-CoV-2 and other Sarbecovirus currently known to infect humans.  If clinically indicated additional testing with an alternate test  methodology [phone]) is advised. The SARS-CoV-2 RNA is generally  detectable in upper and lower respiratory sp ecimens during the acute  phase of infection. The expected result is Negative. Fact Sheet for Patients:  StrictlyIdeas.no Fact Sheet for Healthcare Providers: BankingDealers.co.za This test is not yet approved or cleared by the [place] FDA and has been authorized for detection and/or diagnosis of SARS-CoV-2 by FDA under an Emergency Use Authorization (EUA).  This EUA will remain in effect (meaning this test can be used) for the duration of the COVID-19 declaration under Section 564(b)(1) of the Act, 21 U.S.C. section 360bbb-3(b)(1), unless the authorization is terminated or revoked sooner. Performed at [facility], [address] [place], [place] [address]   Aerobic/Anaerobic Culture (surgical/deep wound)     Status: None (Preliminary result)   Collection Time: 12/22/18  8:00 AM   Specimen: Wound  Result Value Ref Range Status   Specimen Description   Final    WOUND Performed at [facility] [address], [place], [place] [address]    Special Requests   Final    RIGHT FOOT Performed at [facility], [address] [place], [place] [address]    Gram Stain   Final    RARE WBC PRESENT, PREDOMINANTLY PMN FEW GRAM VARIABLE ROD Performed at [facility], [place] [address], [place], [place] [address]    Culture PENDING  Incomplete   Report Status PENDING  Incomplete  CULTURE, BLOOD (ROUTINE X 2) w Reflex to ID Panel     Status: None (Preliminary result)   Collection Time: 12/22/18  9:20 PM   Specimen: BLOOD  Result Value Ref Range Status   Specimen Description BLOOD LEFT ASSIST CONTROL  Final   Special Requests   Final    BOTTLES DRAWN AEROBIC AND ANAEROBIC Blood Culture adequate volume   Culture   Final    NO GROWTH < 12 HOURS Performed at [facility], [address] [place], [place] [address]    Report Status PENDING  Incomplete  CULTURE, BLOOD (ROUTINE X 2) w Reflex to ID Panel     Status: None (Preliminary result)   Collection Time: 12/22/18  9:20 PM   Specimen: BLOOD  Result Value Ref Range Status   Specimen Description BLOOD RIGHT ARM  Final   Special Requests   Final    BOTTLES DRAWN AEROBIC AND ANAEROBIC Blood Culture adequate volume   Culture   Final    NO GROWTH <  12 HOURS Performed at [facility], [address] [place], [place] [address]    Report Status PENDING  Incomplete    Coagulation Studies: Recent Labs    12/23/18 0442  LABPROT 14.2  INR 1.1    Urinalysis: No results for input(s): COLORURINE, LABSPEC, PHURINE, GLUCOSEU, HGBUR, BILIRUBINUR, KETONESUR, PROTEINUR, UROBILINOGEN, NITRITE, LEUKOCYTESUR in the last 72 hours.  Invalid input(s): APPERANCEUR    Imaging: Dg Foot Complete Right  Result Date: 12/21/2018 CLINICAL DATA:  Torquing of tissue of the foot. Foot surgery 2 weeks prior with debridement EXAM: RIGHT FOOT COMPLETE - 3+ VIEW COMPARISON:  Radiograph 11/21/2018 FINDINGS: There is new gas tracking along the plantar surface of the calcaneus. This tract extends from the most posterior margin of the calcaneus and extends anteriorly to connect with a large ulceration along the undersurface of the calcaneus. There is no discrete bony erosion along the calcaneal  margin. Prior osteotomies of the metatarsals 3, 4 and 5. Prior amputation of the first and second ray at the metatarsophalangeal joints. IMPRESSION: 1. New gas tract extending from the posterior margin of the undersurface of the calcaneus to communicate with a large ulceration. 2. No clear evidence of osseous erosion. Electronically Signed   By: Suzy Bouchard M.D.   On: 12/21/2018 15:45     Medications:   . sodium chloride    . cefTAZidime (FORTAZ)  IV    . [START ON 12/24/2018] cefTAZidime (FORTAZ)  IV    . [START ON 12/24/2018] DAPTOmycin (CUBICIN)  IV    . DAPTOmycin (CUBICIN)  IV 750 mg (12/22/18 1800)  . metronidazole 500 mg (12/23/18 0853)   . allopurinol  100 mg Oral Daily  . amiodarone  100 mg Oral Daily  . calcium acetate  1,334 mg Oral TID WC  . carvedilol  3.125 mg Oral BID  . Chlorhexidine Gluconate Cloth  6 each Topical Q0600  . dicyclomine  10 mg Oral TID AC & HS  . ferrous sulfate  325 mg Oral BID WC  . gabapentin  100 mg Oral TID  . heparin  5,000 Units Subcutaneous Q8H  . predniSONE  5 mg Oral Q breakfast  . sodium chloride flush  3 mL Intravenous Q12H   sodium chloride, acetaminophen **OR** acetaminophen, HYDROcodone-acetaminophen, loperamide, ondansetron **OR** ondansetron (ZOFRAN) IV, sodium chloride flush  Assessment/ Plan:  [name] is a 53 y.o. black male with type 2 diabetes mellitus insulin dependent, diabetic neuropathy, hypertension, coronary artery disease status post CABG, systolic and diastolic congestive heart failure, status post AICD, carotid stenosis, stroke, obstructive sleep apnea, peripheral vascular disease, gout, osteomyelitis of heel  [place]   1.  ESRD on HD:   - Continue TTS schedule  2. Hypertension:  - Continue carvedilol.   3. Anemia of chronic kidney disease:   - EPO with HD treatment.   4. Secondary Hyperparathyroidism: not currently on binders.   5. Diabetes mellitus type II with  chronic kidney disease: history of poor control.   6. 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 Currently on ceftazidime and daptomycin with dialysis treatment - Appreciate vascular, podiatry and infectious disease input.  - Plan for amputation later today.    LOS: 2 Essence Merle 8/21/202011:59 AM

## 2018-12-23 NOTE — Anesthesia Procedure Notes (Signed)
Procedure Name: Intubation Date/Time: 12/23/2018 3:07 PM Performed by: Lavone Orn, CRNA Pre-anesthesia Checklist: Patient identified, Emergency Drugs available, Suction available, Patient being monitored and Timeout performed Patient Re-evaluated:Patient Re-evaluated prior to induction Oxygen Delivery Method: Circle system utilized Preoxygenation: Pre-oxygenation with 100% oxygen Induction Type: IV induction Ventilation: Mask ventilation without difficulty Laryngoscope Size: Mac and 4 Grade View: Grade I Tube type: Oral Tube size: 7.5 mm Number of attempts: 1 Airway Equipment and Method: Stylet Placement Confirmation: ETT inserted through vocal cords under direct vision,  positive ETCO2 and breath sounds checked- equal and bilateral Secured at: 22 cm Tube secured with: Tape Dental Injury: Teeth and Oropharynx as per pre-operative assessment

## 2018-12-23 NOTE — Op Note (Signed)
OPERATIVE NOTE   PROCEDURE: Right below-the-knee amputation  PRE-OPERATIVE DIAGNOSIS: Right foot ulceration and infection  POST-OPERATIVE DIAGNOSIS: same as above  SURGEON: Leotis Pain, MD  ASSISTANT(S): Hezzie Bump, PA-C  ANESTHESIA: general  ESTIMATED BLOOD LOSS: 400 cc  FINDING(S): none  SPECIMEN(S):  Right below-the-knee amputation  INDICATIONS:   Paul [name]. is a 53 y.o. male who presents with right foot chronic ulceration with osteomyelitis and chronic infection that cannot be cleared.  The patient is scheduled for a right below-the-knee amputation.  I discussed in depth with the patient the risks, benefits, and alternatives to this procedure.  The patient is aware that the risk of this operation included but are not limited to:  bleeding, infection, myocardial infarction, stroke, death, failure to heal amputation wound, and possible need for more proximal amputation.  The patient is aware of the risks and agrees proceed forward with the procedure. An assistant was present during the procedure to help facilitate the exposure and expedite the procedure.  DESCRIPTION:  After full informed written consent was obtained from the patient, the patient was brought back to the operating room, and placed supine upon the operating table.  Prior to induction, the patient received IV antibiotics.  The patient was then prepped and draped in the standard fashion for a below-the-knee amputation. The assistant provided retraction and mobilization to help facilitate exposure and expedite the procedure throughout the entire procedure.  This included following suture, using retractors, and optimizing lighting. After obtaining adequate anesthesia, the patient was prepped and draped in the standard fashion for a right below-the-knee amputation.  I marked out the anterior incision two finger breadths below the tibial tuberosity and then the marked out a posterior flap that was one third of the  circumference of the calf in length.   I made the incisions for these flaps, and then dissected through the subcutaneous tissue, fascia, and muscle anteriorly.  I elevated  the periosteal tissue superiorly so that the tibia was about 3-4 cm shorter than the anterior skin flap.  I then transected the tibia with a power saw and then took a wedge off the tibia anteriorly with the power saw.  Then I smoothed out the rough edges.  In a similar fashion, I cut back the fibula about two centimeters higher than the level of the tibia with a bone cutter.  I put a bone hook into the distal tibia and then used a large amputation knife to sharply develop a tissue plane through the muscle along the fibula.  In such fashion, the posterior flap was developed.  At this point, the specimen was passed off the field as the below-the-knee amputation.  At this point, I clamped all visibly bleeding arteries and veins using a combination of suture ligation with Silk suture and electrocautery.  Bleeding continued to be controlled with electrocautery and suture ligature.  The stump was washed off with sterile normal saline and no further active bleeding was noted.  I reapproximated the anterior and posterior fascia  with interrupted stitches of 0 Vicryl.  This was completed along the entire length of anterior and posterior fascia until there were no more loose space in the fascial line. I then placed a layer of 2-0 Vicryl sutures in the subcutaneous tissue. The skin was then  reapproximated with staples.  The stump was washed off and dried.  The incision was dressed with Xeroform and  then fluffs were applied.  Kerlix was wrapped around the leg and then  gently an ACE wrap was applied.    COMPLICATIONS: none  CONDITION: stable   Leotis Pain  12/23/2018, 4:22 PM    This note was created with Dragon Medical transcription system. Any errors in dictation are purely unintentional.

## 2018-12-23 NOTE — Anesthesia Postprocedure Evaluation (Signed)
Anesthesia Post Note  Patient: Paul [name].  Procedure(s) Performed: AMPUTATION BELOW KNEE (Right) (Right )  Patient location during evaluation: PACU Anesthesia Type: General Level of consciousness: awake and alert Pain management: pain level controlled Vital Signs Assessment: post-procedure vital signs reviewed and stable Respiratory status: spontaneous breathing, nonlabored ventilation, respiratory function stable and patient connected to nasal cannula oxygen Cardiovascular status: blood pressure returned to baseline and stable Postop Assessment: no apparent nausea or vomiting Anesthetic complications: no     Last Vitals:  Vitals:   12/23/18 1745 12/23/18 1805  BP: 117/69 110/71  Pulse: 76 79  Resp: (!) 25 17  Temp: (!) 36.2 C 36.8 C  SpO2: 100% 100%    Last Pain:  Vitals:   12/23/18 1805  TempSrc: Oral  PainSc:                  Martha Clan

## 2018-12-23 NOTE — Progress Notes (Signed)
[facility] NAME: Paul [name]    MR#:  [id number]  DATE OF BIRTH:  [birth year]  SUBJECTIVE:  CHIEF COMPLAINT:   Chief Complaint  Patient presents with  . Wound Check   Came with foot infection.  REVIEW OF SYSTEMS:  CONSTITUTIONAL: No fever, fatigue or weakness.  EYES: No blurred or double vision.  EARS, NOSE, AND THROAT: No tinnitus or ear pain.  RESPIRATORY: No cough, shortness of breath, wheezing or hemoptysis.  CARDIOVASCULAR: No chest pain, orthopnea, edema.  GASTROINTESTINAL: No nausea, vomiting, diarrhea or abdominal pain.  GENITOURINARY: No dysuria, hematuria.  ENDOCRINE: No polyuria, nocturia,  HEMATOLOGY: No anemia, easy bruising or bleeding SKIN: No rash or lesion. MUSCULOSKELETAL: No joint pain or arthritis.   NEUROLOGIC: No tingling, numbness, weakness.  PSYCHIATRY: No anxiety or depression.   ROS  DRUG ALLERGIES:   Allergies  Allergen Reactions  . Other Other (See Comments)    Cardiac Problems. Pt states he tolerates Toradol. Due to kidney and heart problems per pt  . Ibuprofen Other (See Comments)    Heart problems  . Baclofen Other (See Comments)  . Metformin Diarrhea  . Nsaids     Due to kidney and heart problems per pt    VITALS:  Blood pressure 110/71, pulse 79, temperature 98.2 F (36.8 C), temperature source Oral, resp. rate 17, height 5\' 9"  (1.753 m), weight 90 kg, SpO2 100 %.  PHYSICAL EXAMINATION:   GENERAL:  53 y.o.-year-old patient lying in the bed with no acute distress.  EYES: Pupils equal, round, reactive to light and accommodation. No scleral icterus. Extraocular muscles intact.  HEENT: Head atraumatic, normocephalic. Oropharynx and nasopharynx clear.  NECK:  Supple, no jugular venous distention. No thyroid enlargement, no tenderness.  LUNGS: Normal breath sounds bilaterally, no wheezing, rales,rhonchi or crepitation. No use of accessory muscles of respiration.  CARDIOVASCULAR: S1,  S2 normal. No murmurs, rubs, or gallops.  ABDOMEN: Soft, nontender, nondistended. Bowel sounds present. No organomegaly or mass.  EXTREMITIES: No pedal edema, cyanosis, or clubbing. Right heel ulcer and wound. NEUROLOGIC: Cranial nerves II through XII are intact. Muscle strength 5/5 in all extremities. Sensation intact. Gait not checked.  PSYCHIATRIC: The patient is alert and oriented x 3.  SKIN: No obvious rash, lesion, or ulcer.   Physical Exam LABORATORY PANEL:   CBC Recent Labs  Lab 12/23/18 1712  WBC 12.4*  HGB 7.7*  HCT 26.2*  PLT 414*   ------------------------------------------------------------------------------------------------------------------  Chemistries  Recent Labs  Lab 12/21/18 1512  12/23/18 0442  NA 137   < > 138  K 4.2   < > 4.6  CL 104   < > 102  CO2 22   < > 29  GLUCOSE 208*   < > 133*  BUN 28*   < > 22*  CREATININE 7.16*   < > 6.03*  CALCIUM 7.9*   < > 8.4*  MG  --   --  2.3  AST 16  --   --   ALT 11  --   --   ALKPHOS 108  --   --   BILITOT 0.4  --   --    < > = values in this interval not displayed.   ------------------------------------------------------------------------------------------------------------------  Cardiac Enzymes No results for input(s): TROPONINI in the last 168 hours. ------------------------------------------------------------------------------------------------------------------  RADIOLOGY:  No results found.  ASSESSMENT AND PLAN:   Active Problems:   Right foot infection  1. Right heel osteomyelitis has  not healed.  Podiatry has discussed below-knee amputation. we will continue same antibiotics as he was doing with dialysis. Vascular planning for BKA. 2. End-stage renal disease on hemodialysis Tuesday Thursday and Saturday.   Nephrology has been consulted. 3. Paroxysmal atrial fibrillation. Hold Plavix since the patient may end up needing her procedure.   Hold aspirin as well 4. Chronic systolic  congestive heart failure. Dialysis to manage fluid 5. Gout bilateral hands allopurinol and oral prednisone 5 mg daily 6. Type 2 diabetes mellitus on diet control only. 7. Chronic loose stools continue same home oral regimen  All the records are reviewed and case discussed with Care Management/Social Workerr. Management plans discussed with the patient, family and they are in agreement.  CODE STATUS: Full.  TOTAL TIME TAKING CARE OF THIS PATIENT: 35 minutes.    POSSIBLE D/C IN 1-2 DAYS, DEPENDING ON CLINICAL CONDITION.   Vaughan Basta M.D on 12/23/2018   Between 7am to 6pm - Pager - [phone]  After 6pm go to [email or web address] - password [email or web address] [facility]  Office  [phone]  CC: Primary care physician; Tracie Harrier, MD  Note: This dictation was prepared with Dragon dictation along with smaller phrase technology. Any transcriptional errors that result from this process are unintentional.

## 2018-12-23 NOTE — Progress Notes (Signed)
Patient returned to unit from PACU alert and oriented. Wanting orders immediately for food. Explained it would be just a minute to check post op orders.  Charge nurse able to respond to pts diet needs and release orders.  Right BKA with betadine soaked wrap in place. Stump elevated on pillow x2

## 2018-12-23 NOTE — Progress Notes (Signed)
ID Pt getting BKA today- may be able to Dc all antibiotics in 48-72 hrs

## 2018-12-23 NOTE — Transfer of Care (Signed)
Immediate Anesthesia Transfer of Care Note  Patient: Paul [name].  Procedure(s) Performed: AMPUTATION BELOW KNEE (Right) (Right )  Patient Location: PACU  Anesthesia Type:General  Level of Consciousness: awake and patient cooperative  Airway & Oxygen Therapy: Patient Spontanous Breathing and Patient connected to face mask oxygen  Post-op Assessment: Report given to RN and Post -op Vital signs reviewed and stable  Post vital signs: stable  Last Vitals:  Vitals Value Taken Time  BP 116/71 12/23/18 1646  Temp 37.1 C 12/23/18 1646  Pulse 79 12/23/18 1649  Resp 12 12/23/18 1649  SpO2 100 % 12/23/18 1649  Vitals shown include unvalidated device data.  Last Pain:  Vitals:   12/23/18 1339  TempSrc: Temporal  PainSc: 8       Patients Stated Pain Goal: 2 (47/84/12 8208)  Complications: No apparent anesthesia complications

## 2018-12-23 NOTE — Progress Notes (Addendum)
   12/23/18 1200  Clinical Encounter Type  Visited With Patient;Health care provider  Visit Type Initial  Referral From Nurse  Consult/Referral To Chaplain  Recommendations Follow-up to support in wake of foot amputation   Stress Factors  Patient Stress Factors Health changes;Loss    Chaplain received a referral from the patient's nurse [id number] foot amputation. Upon arrival, the patient was laying in bed. He was awake, alert, and oriented. The patient was welcoming to this chaplain and engaged me in conversation about a range of topics that included some of his medical and personal histories. The patient expressed concerns about the health of family members (mother and sister who have health challenges), his own health, and most importantly the amputation of his foot scheduled for this afternoon. The patient reported concern about his self-image and "not wanting anyone to see him" before he is fitted for a prosthesis. This chaplain provided support in the form of compassionate presence, active and reflective listening, encouragement, and theological reflection. During the visit, the patient's doctor came in to talk with him and and answer questions, so this chaplain will come back following that visit. The patient expressed gratitude for the visit and asked me to return prior to his surgery to pray with him.  This chaplain returned to pray with the patient for his upcoming surgery and his family. The patient expressed gratitude for the prayer and welcomes follow-up.

## 2018-12-23 NOTE — Progress Notes (Signed)
[place] [facility] [place] NAME: Paul [name]    MR#:  [id number]  DATE OF BIRTH:  26-Feb-1966  SUBJECTIVE:  CHIEF COMPLAINT:   Chief Complaint  Patient presents with  . Wound Check   Came with foot infection. Plan for surgery today.  REVIEW OF SYSTEMS:  CONSTITUTIONAL: No fever, fatigue or weakness.  EYES: No blurred or double vision.  EARS, NOSE, AND THROAT: No tinnitus or ear pain.  RESPIRATORY: No cough, shortness of breath, wheezing or hemoptysis.  CARDIOVASCULAR: No chest pain, orthopnea, edema.  GASTROINTESTINAL: No nausea, vomiting, diarrhea or abdominal pain.  GENITOURINARY: No dysuria, hematuria.  ENDOCRINE: No polyuria, nocturia,  HEMATOLOGY: No anemia, easy bruising or bleeding SKIN: No rash or lesion. MUSCULOSKELETAL: No joint pain or arthritis.   NEUROLOGIC: No tingling, numbness, weakness.  PSYCHIATRY: No anxiety or depression.   ROS  DRUG ALLERGIES:   Allergies  Allergen Reactions  . Other Other (See Comments)    Cardiac Problems. Pt states he tolerates Toradol. Due to kidney and heart problems per pt  . Ibuprofen Other (See Comments)    Heart problems  . Baclofen Other (See Comments)  . Metformin Diarrhea  . Nsaids     Due to kidney and heart problems per pt    VITALS:  Blood pressure 110/71, pulse 79, temperature 98.2 F (36.8 C), temperature source Oral, resp. rate 17, height 5\' 9"  (1.753 m), weight 90 kg, SpO2 100 %.  PHYSICAL EXAMINATION:   GENERAL:  53 y.o.-year-old patient lying in the bed with no acute distress.  EYES: Pupils equal, round, reactive to light and accommodation. No scleral icterus. Extraocular muscles intact.  HEENT: Head atraumatic, normocephalic. Oropharynx and nasopharynx clear.  NECK:  Supple, no jugular venous distention. No thyroid enlargement, no tenderness.  LUNGS: Normal breath sounds bilaterally, no wheezing, rales,rhonchi or crepitation. No use of accessory muscles of  respiration.  CARDIOVASCULAR: S1, S2 normal. No murmurs, rubs, or gallops.  ABDOMEN: Soft, nontender, nondistended. Bowel sounds present. No organomegaly or mass.  EXTREMITIES: No pedal edema, cyanosis, or clubbing. Right heel ulcer and wound. NEUROLOGIC: Cranial nerves II through XII are intact. Muscle strength 5/5 in all extremities. Sensation intact. Gait not checked.  PSYCHIATRIC: The patient is alert and oriented x 3.  SKIN: No obvious rash, lesion, or ulcer.   Physical Exam LABORATORY PANEL:   CBC Recent Labs  Lab 12/23/18 1712  WBC 12.4*  HGB 7.7*  HCT 26.2*  PLT 414*   ------------------------------------------------------------------------------------------------------------------  Chemistries  Recent Labs  Lab 12/21/18 1512  12/23/18 0442  NA 137   < > 138  K 4.2   < > 4.6  CL 104   < > 102  CO2 22   < > 29  GLUCOSE 208*   < > 133*  BUN 28*   < > 22*  CREATININE 7.16*   < > 6.03*  CALCIUM 7.9*   < > 8.4*  MG  --   --  2.3  AST 16  --   --   ALT 11  --   --   ALKPHOS 108  --   --   BILITOT 0.4  --   --    < > = values in this interval not displayed.   ------------------------------------------------------------------------------------------------------------------  Cardiac Enzymes No results for input(s): TROPONINI in the last 168 hours. ------------------------------------------------------------------------------------------------------------------  RADIOLOGY:  No results found.  ASSESSMENT AND PLAN:   Active Problems:   Right foot infection  1.  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  Podiatry has discussed below-knee amputation. we will continue same antibiotics as he was doing with dialysis. Vascular planning for BKA. Called ID to help decide abx choice. 2. End-stage renal disease on hemodialysis Tuesday Thursday and Saturday.   Nephrology has been consulted. 3. Paroxysmal atrial fibrillation. Hold Plavix since the patient may end up  needing her procedure.   Hold aspirin as well 4. Chronic systolic congestive heart failure. Dialysis to manage fluid 5. Gout bilateral hands allopurinol and oral prednisone 5 mg daily 6. Type 2 diabetes mellitus on diet control only. 7. Chronic loose stools continue same home oral regimen  All the records are reviewed and case discussed with Care Management/Social Workerr. Management plans discussed with the patient, family and they are in agreement.  CODE STATUS: Full.  TOTAL TIME TAKING CARE OF THIS PATIENT: 35 minutes.    POSSIBLE D/C IN 1-2 DAYS, DEPENDING ON CLINICAL CONDITION.   Vaughan Basta M.D on 12/23/2018   Between 7am to 6pm - Pager - [phone]  After 6pm go to [email or web address] - password [email or web address] [facility]  Office  [phone]  CC: Primary care physician; Tracie Harrier, MD  Note: This dictation was prepared with Dragon dictation along with smaller phrase technology. Any transcriptional errors that result from this process are unintentional.

## 2018-12-23 NOTE — Progress Notes (Signed)
Pt requested to walk to the bathroom to have a BM. Writer Advised the pt use ped pan but the pt refused. Preferred bedside commode. The Probation officer and nurse tech assisted pt to bed side commode but the pt wants the nurse and the tech to leave the room, " I don't want you guys to see my goodies". We  Told the pt that we will leave as soon as he seats on the commode since we were not comfortable leaving him due to his unstable balance. Pt had a near fall, fell back on the bed. Nurse tech and the Probation officer assisted the pt to comfortably ly on bed.

## 2018-12-23 NOTE — Anesthesia Post-op Follow-up Note (Signed)
Anesthesia QCDR form completed.        

## 2018-12-24 ENCOUNTER — Encounter: Payer: Self-pay | Admitting: Vascular Surgery

## 2018-12-24 LAB — GLUCOSE, CAPILLARY
Glucose-Capillary: 176 mg/dL — ABNORMAL HIGH (ref 70–99)
Glucose-Capillary: 148 mg/dL — ABNORMAL HIGH (ref 70–99)
Glucose-Capillary: 154 mg/dL — ABNORMAL HIGH (ref 70–99)
Glucose-Capillary: 193 mg/dL — ABNORMAL HIGH (ref 70–99)

## 2018-12-24 LAB — CBC
HCT: 22.7 % — ABNORMAL LOW (ref 39.0–52.0)
Hemoglobin: 6.8 g/dL — ABNORMAL LOW (ref 13.0–17.0)
MCH: 27.4 pg (ref 26.0–34.0)
MCHC: 30 g/dL (ref 30.0–36.0)
MCV: 91.5 fL (ref 80.0–100.0)
Platelets: 399 10*3/uL (ref 150–400)
RBC: 2.48 MIL/uL — ABNORMAL LOW (ref 4.22–5.81)
RDW: 17.8 % — ABNORMAL HIGH (ref 11.5–15.5)
WBC: 15.6 10*3/uL — ABNORMAL HIGH (ref 4.0–10.5)
nRBC: 0 % (ref 0.0–0.2)

## 2018-12-24 LAB — RENAL FUNCTION PANEL
Albumin: 2.4 g/dL — ABNORMAL LOW (ref 3.5–5.0)
Anion gap: 10 (ref 5–15)
BUN: 40 mg/dL — ABNORMAL HIGH (ref 6–20)
CO2: 24 mmol/L (ref 22–32)
Calcium: 7.8 mg/dL — ABNORMAL LOW (ref 8.9–10.3)
Chloride: 100 mmol/L (ref 98–111)
Creatinine, Ser: 7.88 mg/dL — ABNORMAL HIGH (ref 0.61–1.24)
GFR calc Af Amer: 8 mL/min — ABNORMAL LOW (ref >60)
GFR calc non Af Amer: 7 mL/min — ABNORMAL LOW (ref >60)
Glucose, Bld: 185 mg/dL — ABNORMAL HIGH (ref 70–99)
Phosphorus: 5.2 mg/dL — ABNORMAL HIGH (ref 2.5–4.6)
Potassium: 5.7 mmol/L — ABNORMAL HIGH (ref 3.5–5.1)
Sodium: 134 mmol/L — ABNORMAL LOW (ref 135–145)

## 2018-12-24 LAB — HEMOGLOBIN A1C
Hgb A1c MFr Bld: 7.5 % — ABNORMAL HIGH (ref 4.8–5.6)
Mean Plasma Glucose: 168.55 mg/dL

## 2018-12-24 LAB — PREPARE RBC (CROSSMATCH)

## 2018-12-24 MED ORDER — HYDROCODONE-ACETAMINOPHEN 5-325 MG PO TABS
1.0000 | ORAL_TABLET | Freq: Four times a day (QID) | ORAL | Status: DC | PRN
  Administered 2018-12-24 – 2019-01-03 (×29): 1 via ORAL
  Filled 2018-12-24 (×29): qty 1

## 2018-12-24 MED ORDER — CLOPIDOGREL BISULFATE 75 MG PO TABS
75.0000 mg | ORAL_TABLET | Freq: Every day | ORAL | Status: DC
  Administered 2018-12-24 – 2019-01-03 (×10): 75 mg via ORAL
  Filled 2018-12-24 (×10): qty 1

## 2018-12-24 MED ORDER — MORPHINE SULFATE (PF) 2 MG/ML IV SOLN
1.0000 mg | INTRAVENOUS | Status: DC | PRN
  Administered 2018-12-24: 1 mg via INTRAVENOUS
  Filled 2018-12-24: qty 1

## 2018-12-24 MED ORDER — ASPIRIN 81 MG PO CHEW
81.0000 mg | CHEWABLE_TABLET | Freq: Every day | ORAL | Status: DC
  Administered 2018-12-24 – 2019-01-03 (×10): 81 mg via ORAL
  Filled 2018-12-24 (×11): qty 1

## 2018-12-24 MED ORDER — SODIUM CHLORIDE 0.9% IV SOLUTION: Freq: Once | INTRAVENOUS | Status: DC

## 2018-12-24 NOTE — Progress Notes (Signed)
Subjective  - POD #1, s/p right BKA  Having pain at amputation site Some phantom pain Near fall yesterday HD today   Physical Exam:  Dressing intact and dry       Assessment/Plan:  POD #1  Routine post operative care Issues with dispo to rehab due to prior rehab visit this year.  He will need significant help at home as he has minimal assistance other than a 54 year old mother. --Post op blood loss anemia.  Monitor HCT, 22.7 today.  No indication for transfusion yet, however continue to monitor  Paul [name] 12/24/2018 5:45 PM --  Vitals:   12/24/18 1452 12/24/18 1649  BP: (!) 150/91 111/70  Pulse: 86 87  Resp: 14 18  Temp:  98 F (36.7 C)  SpO2: 100% 98%    Intake/Output Summary (Last 24 hours) at 12/24/2018 1745 Last data filed at 12/24/2018 1722 Gross per 24 hour  Intake 468.49 ml  Output 1783 ml  Net -1314.51 ml     Laboratory CBC    Component Value Date/Time   WBC 15.6 (H) 12/24/2018 0701   HGB 6.8 (L) 12/24/2018 0701   HCT 22.7 (L) 12/24/2018 0701   PLT 399 12/24/2018 0701    BMET    Component Value Date/Time   NA 134 (L) 12/24/2018 0701   NA 139 06/23/2016 1553   K 5.7 (H) 12/24/2018 0701   CL 100 12/24/2018 0701   CO2 24 12/24/2018 0701   GLUCOSE 185 (H) 12/24/2018 0701   BUN 40 (H) 12/24/2018 0701   BUN 46 (H) 06/23/2016 1553   CREATININE 7.88 (H) 12/24/2018 0701   CALCIUM 7.8 (L) 12/24/2018 0701   GFRNONAA 7 (L) 12/24/2018 0701   GFRAA 8 (L) 12/24/2018 0701    COAG Lab Results  Component Value Date   INR 1.1 12/23/2018   INR 1.1 11/21/2018   INR 1.18 05/25/2018   No results found for: PTT  Antibiotics Anti-infectives (From admission, onward)   Start     Dose/Rate Route Frequency Ordered Stop   12/24/18 1800  DAPTOmycin (CUBICIN) 1,000 mg in sodium chloride 0.9 % IVPB     1,000 mg 240 mL/hr over 30 Minutes Intravenous Every Sat (Hemodialysis) 12/21/18 2037     12/24/18 1200  cefTAZidime (FORTAZ) 2 g in sodium  chloride 0.9 % 100 mL IVPB     2 g 200 mL/hr over 30 Minutes Intravenous Every Sat (Hemodialysis) 12/21/18 2032     12/22/18 2100  metroNIDAZOLE (FLAGYL) IVPB 500 mg     500 mg 100 mL/hr over 60 Minutes Intravenous Every 12 hours 12/22/18 2041     12/22/18 1800  cefTAZidime (FORTAZ) 1 g in sodium chloride 0.9 % 100 mL IVPB  Status:  Discontinued     1 g 200 mL/hr over 30 Minutes Intravenous Once per day on Tue Thu 12/21/18 2029 12/21/18 2033   12/22/18 1800  cefTAZidime (FORTAZ) 1 g in sodium chloride 0.9 % 100 mL IVPB     1 g 200 mL/hr over 30 Minutes Intravenous Once per day on Tue Thu 12/21/18 2032     12/22/18 1800  DAPTOmycin (CUBICIN) 750 mg in sodium chloride 0.9 % IVPB     750 mg 230 mL/hr over 30 Minutes Intravenous Once per day on Tue Thu 12/21/18 2037     12/22/18 1200  cefTAZidime (FORTAZ) IVPB  Status:  Discontinued    Note to Pharmacy: Give 1gm with HD on Tuesdays and Thursdays and 2gm on  Saturdays Indication:  Osteomyelitis Last Day of Therapy:  12/28/2018 Labs - Once weekly: CK, CBC/D and CMP,     1-2 g Intravenous Every T-Th-Sa (Hemodialysis) 12/21/18 2024 12/21/18 2028   12/22/18 1200  daptomycin (CUBICIN) IVPB  Status:  Discontinued    Note to Pharmacy: Give 750mg  after HD on Tuesdays and Thursdays and on Saturdays give 1000mg  Indication:  Osteomyelitis Last Day of Therapy:  12/28/2018 Labs - Once weekly:  CBC/D, CMP, and CPK     750-1,000 mg Intravenous Every T-Th-Sa (Hemodialysis) 12/21/18 2024 12/21/18 2034   12/22/18 1200  cefTAZidime (FORTAZ) IVPB  Status:  Discontinued    Note to Pharmacy: Give 1gm with HD on Tuesdays and Thursdays and 2gm on Saturdays Indication:  Osteomyelitis Last Day of Therapy:  12/28/2018 Labs - Once weekly: CK, CBC/D and CMP,     1-2 g Intravenous Every T-Th-Sa (Hemodialysis) 12/21/18 2028 12/21/18 2028       V. Leia Alf, M.D., [facility] [place] Office: [phone] Pager:  [phone]

## 2018-12-24 NOTE — TOC Initial Note (Signed)
Transition of Care (TOC) - Initial/Assessment Note    Patient Details  Name: Paul [name]. MRN: [id number] Date of Birth: 30-Nov-1965  Transition of Care [facility]) CM/SW Contact:    Marshell Garfinkel, RN Phone Number: 12/24/2018, 2:45 PM  Clinical Narrative:                 RNCM spoke with patient by phone. He hopes to go to rehab here close to this hospital. He agrees to bed search which has been started.   Expected Discharge Plan: [place]     Patient Goals and CMS Choice Patient states their goals for this hospitalization and ongoing recovery are:: "I hope that I can get short term rehab because I live alone" CMS Medicare.gov Compare Post Acute Care list provided to:: Patient Choice offered to / list presented to : Patient  Expected Discharge Plan and Services Expected Discharge Plan: [place] Choice: [place] Living arrangements for the past 2 months: Single Family Home Expected Discharge Date: 12/23/18                                    Prior Living Arrangements/Services Living arrangements for the past 2 months: Single Family Home Lives with:: Self                   Activities of Daily Living Home Assistive Devices/Equipment: Environmental consultant (specify type) ADL Screening (condition at time of admission) Patient's cognitive ability adequate to safely complete daily activities?: Yes Is the patient deaf or have difficulty hearing?: No Does the patient have difficulty seeing, even when wearing glasses/contacts?: No Does the patient have difficulty concentrating, remembering, or making decisions?: No Patient able to express need for assistance with ADLs?: Yes Does the patient have difficulty dressing or bathing?: No Independently performs ADLs?: Yes (appropriate for developmental age) Does the patient have difficulty walking or climbing stairs?: Yes Weakness of Legs: Both Weakness of Arms/Hands:  Both  Permission Sought/Granted                  Emotional Assessment              Admission diagnosis:  Wound infection [T14.8XXA, L08.9] Patient Active Problem List   Diagnosis Date Noted  . Right foot infection 12/21/2018  . AKI (acute kidney injury) ([place]) 10/04/2018  . Acute osteomyelitis of right ankle or foot ([place]) 09/06/2018  . Pressure ulcer of heel, right, unstageable ([place]) 09/06/2018  . Acute osteomyelitis of right foot ([place]) 07/25/2018  . Osteomyelitis ([place]) 07/25/2018  . Anasarca 05/25/2018  . Pressure injury of skin 05/25/2018  . CKD (chronic kidney disease), stage IV ([place]) 12/15/2016  . Ulcerated, foot ([place]) 12/15/2016  . Gangrene of foot ([place]) 08/21/2016  . HTN (hypertension) 08/21/2016  . Diabetes ([place]) 08/21/2016  . CAD (coronary artery disease) 08/21/2016  . Chronic systolic CHF (congestive heart failure) ([place]) 08/21/2016  . Diabetic foot infection ([place]) 08/21/2016  . Heterotopic ossification of bone 07/12/2014   PCP:  Tracie Harrier, MD Pharmacy:   [facility] [address], [place] - [place] [address] [place] [address] Phone: [phone] Fax: [phone]  [place], [place] [address] Phone: [phone] Fax: [phone]     Social Determinants of Health (SDOH) Interventions  Readmission Risk Interventions Readmission Risk Prevention Plan 11/25/2018 10/06/2018 09/09/2018  Transportation Screening Complete Complete Complete  PCP or Specialist Appt within 3-5 Days Complete Complete Complete  HRI or Home Care Consult Complete Complete Complete  Social Work Consult for [place] Planning/Counseling Complete Complete Not Complete  SW consult not completed comments - - NA  Palliative Care Screening Not Applicable Not Applicable Not Applicable  Medication Review (RN Care Manager) Complete - -  Some recent data might be hidden

## 2018-12-24 NOTE — Plan of Care (Signed)

## 2018-12-24 NOTE — Progress Notes (Addendum)
[facility]  ROUNDING NOTE   Subjective:   Seen and examined on hemodialysis treatment. Tolerating treatment well. UF of 2 liters.   Right BKA by Dr. Lucky Cowboy yesterday. POD 1.   Patient complains of pain, but currently talking on the phone with no concerns.     HEMODIALYSIS FLOWSHEET:  Blood Flow Rate (mL/min): 350 mL/min Arterial Pressure (mmHg): -140 mmHg Venous Pressure (mmHg): 130 mmHg Transmembrane Pressure (mmHg): 60 mmHg Ultrafiltration Rate (mL/min): 570 mL/min Dialysate Flow Rate (mL/min): 600 ml/min Conductivity: Machine : 13.9 Conductivity: Machine : 13.9 Dialysis Fluid Bolus: Normal Saline Bolus Amount (mL): 250 mL    Objective:  Vital signs in last 24 hours:  Temp:  [97.1 F (36.2 C)-99 F (37.2 C)] 99 F (37.2 C) (08/22 1115) Pulse Rate:  [76-90] 80 (08/22 1200) Resp:  [12-25] 17 (08/22 1200) BP: (102-151)/(49-85) 105/64 (08/22 1200) SpO2:  [11 %-100 %] 11 % (08/22 1115) Weight:  [73.4 kg-90 kg] 73.4 kg (08/22 0434)  Weight change: 0 kg Filed Weights   12/22/18 0940 12/23/18 1339 12/24/18 0434  Weight: 90 kg 90 kg 73.4 kg    Intake/Output: I/O last 3 completed shifts: In: 950 [P.O.:200; I.V.:450; IV Piggyback:300] Out: 400 [Blood:400]   Intake/Output this shift:  No intake/output data recorded.  Physical Exam: General: No acute distress, laying in bed  Head: Normocephalic, atraumatic. Moist oral mucosal membranes  Eyes: Anicteric  Neck: Supple, trachea midline  Lungs:  Clear to auscultation, normal effort  Heart: regular  Abdomen:  Soft, nontender, bowel sounds present  Extremities: Left foot 1+ edema, right BKA with clean and dry dressings.   Neurologic: Awake, alert, following commands  Skin: No lesions  Access: IJ PermCath.    Basic Metabolic Panel: Recent Labs  Lab 12/21/18 1512 12/22/18 0413 12/23/18 0442 12/24/18 0701  NA 137 140 138 134*  K 4.2 4.6 4.6 5.7*  CL 104 106 102 100  CO2 22 24 29 24   GLUCOSE 208*  106* 133* 185*  BUN 28* 30* 22* 40*  CREATININE 7.16* 8.31* 6.03* 7.88*  CALCIUM 7.9* 7.9* 8.4* 7.8*  MG  --   --  2.3  --   PHOS  --   --   --  5.2*    Liver Function Tests: Recent Labs  Lab 12/21/18 1512 12/24/18 0701  AST 16  --   ALT 11  --   ALKPHOS 108  --   BILITOT 0.4  --   PROT 7.1  --   ALBUMIN 2.9* 2.4*   No results for input(s): LIPASE, AMYLASE in the last 168 hours. No results for input(s): AMMONIA in the last 168 hours.  CBC: Recent Labs  Lab 12/21/18 1512 12/22/18 0413 12/23/18 0442 12/23/18 1712 12/24/18 0701  WBC 10.9* 9.3 9.9 12.4* 15.6*  NEUTROABS 9.0*  --   --   --   --   HGB 7.7* 7.1* 8.2* 7.7* 6.8*  HCT 27.1* 24.5* 28.1* 26.2* 22.7*  MCV 94.8 93.5 92.4 92.6 91.5  PLT 382 393 432* 414* 399    Cardiac Enzymes: Recent Labs  Lab 12/22/18 1200 12/23/18 0442  CKTOTAL 34* 29*    BNP: Invalid input(s): POCBNP  CBG: Recent Labs  Lab 12/23/18 1418 12/23/18 1647 12/23/18 1853 12/23/18 2100 12/24/18 0800  GLUCAP 100* 100* 149* 231* 176*    Microbiology: Results for orders placed or performed during the hospital encounter of 12/21/18  SARS Coronavirus 2 [facility] order, Performed in [facility] hospital lab) Nasopharyngeal Nasopharyngeal Swab  Status: None   Collection Time: 12/21/18  5:14 PM   Specimen: Nasopharyngeal Swab  Result Value Ref Range Status   SARS Coronavirus 2 NEGATIVE NEGATIVE Final    Comment: (NOTE) If result is NEGATIVE SARS-CoV-2 target nucleic acids are NOT DETECTED. The SARS-CoV-2 RNA is generally detectable in upper and lower  respiratory specimens during the acute phase of infection. The lowest  concentration of SARS-CoV-2 viral copies this assay can detect is 250  copies / mL. A negative result does not preclude SARS-CoV-2 infection  and should not be used as the sole basis for treatment or other  patient management decisions.  A negative result may occur with  improper specimen collection / handling,  submission of specimen other  than nasopharyngeal swab, presence of viral mutation(s) within the  areas targeted by this assay, and inadequate number of viral copies  (<250 copies / mL). A negative result must be combined with clinical  observations, patient history, and epidemiological information. If result is POSITIVE SARS-CoV-2 target nucleic acids are DETECTED. The SARS-CoV-2 RNA is generally detectable in upper and lower  respiratory specimens dur ing the acute phase of infection.  Positive  results are indicative of active infection with SARS-CoV-2.  Clinical  correlation with patient history and other diagnostic information is  necessary to determine patient infection status.  Positive results do  not rule out bacterial infection or co-infection with other viruses. If result is PRESUMPTIVE POSTIVE SARS-CoV-2 nucleic acids MAY BE PRESENT.   A presumptive positive result was obtained on the submitted specimen  and confirmed on repeat testing.  While 2019 novel coronavirus  (SARS-CoV-2) nucleic acids may be present in the submitted sample  additional confirmatory testing may be necessary for epidemiological  and / or clinical management purposes  to differentiate between  SARS-CoV-2 and other Sarbecovirus currently known to infect humans.  If clinically indicated additional testing with an alternate test  methodology [phone]) is advised. The SARS-CoV-2 RNA is generally  detectable in upper and lower respiratory sp ecimens during the acute  phase of infection. The expected result is Negative. Fact Sheet for Patients:  StrictlyIdeas.no Fact Sheet for Healthcare Providers: BankingDealers.co.za This test is not yet approved or cleared by the [place] FDA and has been authorized for detection and/or diagnosis of SARS-CoV-2 by FDA under an Emergency Use Authorization (EUA).  This EUA will remain in effect (meaning this test can be  used) for the duration of the COVID-19 declaration under Section 564(b)(1) of the Act, 21 U.S.C. section 360bbb-3(b)(1), unless the authorization is terminated or revoked sooner. Performed at [facility], [address] [place], [place] [address]   Aerobic/Anaerobic Culture (surgical/deep wound)     Status: None (Preliminary result)   Collection Time: 12/22/18  8:00 AM   Specimen: Wound  Result Value Ref Range Status   Specimen Description   Final    WOUND Performed at [facility], [address], [place], [place] [address]    Special Requests   Final    RIGHT FOOT Performed at [facility], [address] [place], [place] [address]    Gram Stain   Final    RARE WBC PRESENT, PREDOMINANTLY PMN FEW GRAM VARIABLE ROD Performed at [facility], [place] [address] [place],  [address]    Culture   Final    ABUNDANT MULTIPLE ORGANISMS PRESENT, NONE PREDOMINANT   Report Status PENDING  Incomplete  CULTURE, BLOOD (ROUTINE X 2) w Reflex to ID Panel  Status: None (Preliminary result)   Collection Time: 12/22/18  9:20 PM   Specimen: BLOOD  Result Value Ref Range Status   Specimen Description BLOOD LEFT ASSIST CONTROL  Final   Special Requests   Final    BOTTLES DRAWN AEROBIC AND ANAEROBIC Blood Culture adequate volume   Culture   Final    NO GROWTH < 12 HOURS Performed at [facility], [address], [place], [place] [address]    Report Status PENDING  Incomplete  CULTURE, BLOOD (ROUTINE X 2) w Reflex to ID Panel     Status: None (Preliminary result)   Collection Time: 12/22/18  9:20 PM   Specimen: BLOOD  Result Value Ref Range Status   Specimen Description BLOOD RIGHT ARM  Final   Special Requests   Final    BOTTLES DRAWN AEROBIC AND ANAEROBIC Blood Culture adequate volume   Culture   Final    NO GROWTH < 12 HOURS Performed at [facility], [address] [place], [place] [address]    Report Status PENDING   Incomplete  MRSA PCR Screening     Status: None   Collection Time: 12/23/18 11:01 AM   Specimen: Nasopharyngeal  Result Value Ref Range Status   MRSA by PCR NEGATIVE NEGATIVE Final    Comment:        The GeneXpert MRSA Assay (FDA approved for NASAL specimens only), is one component of a comprehensive MRSA colonization surveillance program. It is not intended to diagnose MRSA infection nor to guide or monitor treatment for MRSA infections. Performed at [facility], [address] [place], [place] [address]     Coagulation Studies: Recent Labs    12/23/18 0442  LABPROT 14.2  INR 1.1    Urinalysis: No results for input(s): COLORURINE, LABSPEC, PHURINE, GLUCOSEU, HGBUR, BILIRUBINUR, KETONESUR, PROTEINUR, UROBILINOGEN, NITRITE, LEUKOCYTESUR in the last 72 hours.  Invalid input(s): APPERANCEUR    Imaging: No results found.   Medications:   . sodium chloride    . cefTAZidime (FORTAZ)  IV    . cefTAZidime (FORTAZ)  IV    . DAPTOmycin (CUBICIN)  IV    . DAPTOmycin (CUBICIN)  IV 750 mg (12/22/18 1800)  . metronidazole 500 mg (12/24/18 1044)   . sodium chloride   Intravenous Once  . allopurinol  100 mg Oral Daily  . amiodarone  100 mg Oral Daily  . calcium acetate  1,334 mg Oral TID WC  . carvedilol  3.125 mg Oral BID  . Chlorhexidine Gluconate Cloth  6 each Topical Q0600  . dicyclomine  10 mg Oral TID AC & HS  . ferrous sulfate  325 mg Oral BID WC  . gabapentin  100 mg Oral TID  . heparin  5,000 Units Subcutaneous Q8H  . insulin aspart  0-5 Units Subcutaneous QHS  . insulin aspart  0-9 Units Subcutaneous TID WC  . predniSONE  5 mg Oral Q breakfast  . sodium chloride flush  3 mL Intravenous Q12H   sodium chloride, acetaminophen **OR** acetaminophen, HYDROcodone-acetaminophen, loperamide, morphine injection, ondansetron **OR** ondansetron (ZOFRAN) IV, sodium chloride flush  Assessment/ Plan:  Mr. Lynx Goodrich. is a 53 y.o. black male with type 2  diabetes mellitus insulin dependent, diabetic neuropathy, hypertension, coronary artery disease status post CABG, systolic and diastolic congestive heart failure, status post AICD, carotid stenosis, stroke, obstructive sleep apnea, peripheral vascular disease, gout, osteomyelitis of heel  [place]   1.  ESRD on  HD:  Seen and examined on hemodialysis treatment. Tolerating treatment.  - Continue TTS schedule  2. Hypertension: 105/64 - Continue carvedilol.   3. Anemia of chronic kidney disease:   - PRBC transfusion ordered.  - EPO with HD treatment.   4. Secondary Hyperparathyroidism: not currently on binders.   5. Diabetes mellitus type II with chronic kidney disease: history of poor control.   6. 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  - per ID, antibiotics may be discontinued post op 48-72 hours.  - Appreciate vascular, podiatry and infectious disease input.    LOS: 3 Dayanis Bergquist 8/22/202012:07 PM

## 2018-12-24 NOTE — Progress Notes (Signed)
[place] [facility] [place] NAME: Paul [name]    MR#:  [id number]  DATE OF BIRTH:  1966/04/05  SUBJECTIVE:  CHIEF COMPLAINT:   Chief Complaint  Patient presents with  . Wound Check   Came with foot infection. 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  REVIEW OF SYSTEMS:  CONSTITUTIONAL: No fever, fatigue or weakness.  EYES: No blurred or double vision.  EARS, NOSE, AND THROAT: No tinnitus or ear pain.  RESPIRATORY: No cough, shortness of breath, wheezing or hemoptysis.  CARDIOVASCULAR: No chest pain, orthopnea, edema.  GASTROINTESTINAL: No nausea, vomiting, diarrhea or abdominal pain.  GENITOURINARY: No dysuria, hematuria.  ENDOCRINE: No polyuria, nocturia,  HEMATOLOGY: No anemia, easy bruising or bleeding SKIN: No rash or lesion. MUSCULOSKELETAL: No joint pain or arthritis.   NEUROLOGIC: No tingling, numbness, weakness.  PSYCHIATRY: No anxiety or depression.   Review of Systems  Neurological: Seizures: BKA done 12/23/18.    DRUG ALLERGIES:   Allergies  Allergen Reactions  . Other Other (See Comments)    Cardiac Problems. Pt states he tolerates Toradol. Due to kidney and heart problems per pt  . Ibuprofen Other (See Comments)    Heart problems  . Baclofen Other (See Comments)  . Metformin Diarrhea  . Nsaids     Due to kidney and heart problems per pt    VITALS:  Blood pressure (!) 150/91, pulse 86, temperature 98.8 F (37.1 C), temperature source Oral, resp. rate 14, height 5\' 9"  (1.753 m), weight 87.1 kg, SpO2 100 %.  PHYSICAL EXAMINATION:   GENERAL:  53 y.o.-year-old patient lying in the bed with no acute distress.  EYES: Pupils equal, round, reactive to light and accommodation. No scleral icterus. Extraocular muscles intact.  HEENT: Head atraumatic, normocephalic. Oropharynx and nasopharynx clear.  NECK:  Supple, no jugular venous distention. No thyroid enlargement, no tenderness.  LUNGS: Normal breath sounds bilaterally, no wheezing,  rales,rhonchi or crepitation. No use of accessory muscles of respiration.  CARDIOVASCULAR: S1, S2 normal. No murmurs, rubs, or gallops.  ABDOMEN: Soft, nontender, nondistended. Bowel sounds present. No organomegaly or mass.  EXTREMITIES: No pedal edema, cyanosis, or clubbing. right BKA NEUROLOGIC: Cranial nerves II through XII are intact. Muscle strength 5/5 in all extremities. Sensation intact. Gait not checked.  PSYCHIATRIC: The patient is alert and oriented x 3.  SKIN: No obvious rash, lesion, or ulcer.   Physical Exam LABORATORY PANEL:   CBC Recent Labs  Lab 12/24/18 0701  WBC 15.6*  HGB 6.8*  HCT 22.7*  PLT 399   ------------------------------------------------------------------------------------------------------------------  Chemistries  Recent Labs  Lab 12/21/18 1512  12/23/18 0442 12/24/18 0701  NA 137   < > 138 134*  K 4.2   < > 4.6 5.7*  CL 104   < > 102 100  CO2 22   < > 29 24  GLUCOSE 208*   < > 133* 185*  BUN 28*   < > 22* 40*  CREATININE 7.16*   < > 6.03* 7.88*  CALCIUM 7.9*   < > 8.4* 7.8*  MG  --   --  2.3  --   AST 16  --   --   --   ALT 11  --   --   --   ALKPHOS 108  --   --   --   BILITOT 0.4  --   --   --    < > = values in this interval not displayed.   ------------------------------------------------------------------------------------------------------------------  Cardiac  Enzymes No results for input(s): TROPONINI in the last 168 hours. ------------------------------------------------------------------------------------------------------------------  RADIOLOGY:  No results found.  ASSESSMENT AND PLAN:   Active Problems:   Right foot infection  1. 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  Podiatry has discussed below-knee amputation. we will continue same antibiotics as he was doing with dialysis. BKA done 12/23/18 Called ID to help decide abx choice. As per ID, may stop ABx in 1-2 days now. 2. End-stage renal disease on  hemodialysis Tuesday Thursday and Saturday.   Nephrology has been consulted. 3. Paroxysmal atrial fibrillation. Hold Plavix since the patient may end up needing her procedure.   Hold aspirin as well 4. Chronic systolic congestive heart failure. Dialysis to manage fluid 5. Gout bilateral hands allopurinol and oral prednisone 5 mg daily 6. Type 2 diabetes mellitus on diet control only. 7. Chronic loose stools continue same home oral regimen  All the records are reviewed and case discussed with Care Management/Social Workerr. Management plans discussed with the patient, family and they are in agreement.  CODE STATUS: Full.  TOTAL TIME TAKING CARE OF THIS PATIENT: 35 minutes.    POSSIBLE D/C IN 1-2 DAYS, DEPENDING ON CLINICAL CONDITION.   Vaughan Basta M.D on 12/24/2018   Between 7am to 6pm - Pager - [phone]  After 6pm go to [email or web address] - password [email or web address] [facility]  Office  [phone]  CC: Primary care physician; Tracie Harrier, MD  Note: This dictation was prepared with Dragon dictation along with smaller phrase technology. Any transcriptional errors that result from this process are unintentional.

## 2018-12-24 NOTE — Evaluation (Signed)
Physical Therapy Evaluation Patient Details Name: Paul [name]. MRN: [id number] DOB: 1966-04-25 Today's Date: 12/24/2018   History of Present Illness  53 y/o male here for non-healing wound to R heel, now referred to PT for his new R BK amputation performed on 12/23/18.  PMHx:  osteomyelitis, ESRD, PAF, CHF, DM, gout, GI symptoms    Clinical Impression  Pt was seen for mobility and note his issues of pain, concerns about being accepted to rehab and his ongoing plan for working toward a prosthetic fitting.  Spent time educating pt about the process of being ready and beyond for a prosthesis, talked about being attentive to the healing process and his general health to improve the transition to being ready for fitting.  He is concerned about being home alone 12 hours and having no family to help, but is demonstrating ability to be rehabilitated back to more independence.  Will follow up with knee walker training as is appropriate, and will follow pt through to prepare for SNF transition.      Follow Up Recommendations SNF    Equipment Recommendations  None recommended by PT    Recommendations for Other Services       Precautions / Restrictions Precautions Precautions: Fall Precaution Comments: NWB on RLE Restrictions Weight Bearing Restrictions: Yes RLE Weight Bearing: Non weight bearing      Mobility  Bed Mobility Overal bed mobility: Modified Independent             General bed mobility comments: Pt able to get himself up to EOB w/o assist  Transfers Overall transfer level: Needs assistance Equipment used: Rolling walker (2 wheeled);1 person hand held assist Transfers: Sit to/from Stand Sit to Stand: Min assist;From elevated surface         General transfer comment: able to do STS and SPT to [facility]  Ambulation/Gait             General Gait Details: deferred due to 6.8 hgb  Stairs            Wheelchair Mobility    Modified Rankin (Stroke Patients  Only)       Balance Overall balance assessment: Needs assistance Sitting-balance support: Single extremity supported;Bilateral upper extremity supported Sitting balance-Leahy Scale: Fair Sitting balance - Comments: shifts backward with use of LE's in sitting Postural control: Posterior lean Standing balance support: Bilateral upper extremity supported;During functional activity Standing balance-Leahy Scale: Poor Standing balance comment: requires RW for transition to [facility], can clear hips side of bed with LLE and min guard                             Pertinent Vitals/Pain Pain Assessment: 0-10 Pain Score: 8  Pain Location: R leg with mobility Pain Descriptors / Indicators: Operative site guarding;Grimacing Pain Intervention(s): Limited activity within patient's tolerance;Monitored during session;Premedicated before session;Repositioned;Patient requesting pain meds-RN notified    Home Living Family/patient expects to be discharged to:: Private residence Living Arrangements: Non-relatives/Friends Available Help at Discharge: Friend(s);Available PRN/intermittently Type of Home: House Home Access: Ramped entrance     Home Layout: One level Home Equipment: Walker - 2 wheels;Walker - 4 wheels;Wheelchair - manual Additional Comments: has no family who can help due to their health issues    Prior Function Level of Independence: Independent with assistive device(s)         Comments: using RW most recently with wc     Hand Dominance   Dominant Hand:  Right    Extremity/Trunk Assessment   Upper Extremity Assessment Upper Extremity Assessment: Generalized weakness    Lower Extremity Assessment Lower Extremity Assessment: Generalized weakness;RLE deficits/detail;LLE deficits/detail RLE Deficits / Details: NWB and new BK amp RLE: Unable to fully assess due to pain RLE Coordination: decreased gross motor LLE Deficits / Details: generally weak, hip and hamstrings  4-, DF L 3    Cervical / Trunk Assessment Cervical / Trunk Assessment: Other exceptions(has degenerative neck condition with UE weakness resulting)  Communication   Communication: No difficulties  Cognition Arousal/Alertness: Awake/alert Behavior During Therapy: WFL for tasks assessed/performed Overall Cognitive Status: Within Functional Limits for tasks assessed                                        General Comments General comments (skin integrity, edema, etc.): Pt is weaker from surgery and ongoing changes of neck from DDD.  He is motivated and works hard, but is having 8/10 pain on RLE and generalized weakness from the c-spine and new surgery    Exercises     Assessment/Plan    PT Assessment Patient needs continued PT services  PT Problem List Decreased strength;Decreased range of motion;Decreased activity tolerance;Decreased balance;Decreased mobility;Decreased coordination;Cardiopulmonary status limiting activity;Decreased skin integrity;Pain       PT Treatment Interventions DME instruction;Gait training;Functional mobility training;Therapeutic activities;Therapeutic exercise;Balance training;Neuromuscular re-education;Patient/family education    PT Goals (Current goals can be found in the Care Plan section)  Acute Rehab PT Goals Patient Stated Goal: get stronger and avoid a fall PT Goal Formulation: With patient Time For Goal Achievement: 01/07/19 Potential to Achieve Goals: Good    Frequency Min 2X/week   Barriers to discharge Inaccessible home environment;Decreased caregiver support home with a ramp and     Co-evaluation               AM-PAC PT "6 Clicks" Mobility  Outcome Measure Help needed turning from your back to your side while in a flat bed without using bedrails?: None Help needed moving from lying on your back to sitting on the side of a flat bed without using bedrails?: A Little Help needed moving to and from a bed to a chair  (including a wheelchair)?: A Little Help needed standing up from a chair using your arms (e.g., wheelchair or bedside chair)?: A Lot Help needed to walk in hospital room?: A Lot Help needed climbing 3-5 steps with a railing? : Total 6 Click Score: 15    End of Session Equipment Utilized During Treatment: Gait belt Activity Tolerance: Patient tolerated treatment well;Patient limited by fatigue;Treatment limited secondary to medical complications (Comment);Patient limited by pain(weakness UE's and LE's) Patient left: in bed;with call bell/phone within reach;with bed alarm set Nurse Communication: Mobility status PT Visit Diagnosis: Unsteadiness on feet (R26.81);Muscle weakness (generalized) (M62.81)    Time: 0160-1093 PT Time Calculation (min) (ACUTE ONLY): 39 min   Charges:   PT Evaluation $PT Eval Moderate Complexity: 1 Mod PT Treatments $Therapeutic Exercise: 8-22 mins $Therapeutic Activity: 8-22 mins       Ramond Dial 12/24/2018, 10:40 AM   Mee Hives, PT MS Acute Rehab Dept. Number: [place] and [place]

## 2018-12-24 NOTE — NC FL2 (Signed)
[place] LEVEL OF CARE SCREENING TOOL     IDENTIFICATION  Patient Name: Paul [name]. Birthdate: 12-12-1965 Sex: male Admission Date (Current Location): 12/21/2018  [place] and [place] Number:  Engineering geologist and Address:  [facility], [address], [place], [place] [address]      Provider Number: [id number]  Attending Physician Name and Address:  Vaughan Basta, *  Relative Name and Phone Number:       Current Level of Care: Hospital Recommended Level of Care: [place] Prior Approval Number:    Date Approved/Denied:   PASRR Number: [id number]  Discharge Plan: SNF    Current Diagnoses: Patient Active Problem List   Diagnosis Date Noted  . Right foot infection 12/21/2018  . AKI (acute kidney injury) ([place]) 10/04/2018  . Acute osteomyelitis of right ankle or foot ([place]) 09/06/2018  . Pressure ulcer of heel, right, unstageable ([place]) 09/06/2018  . Acute osteomyelitis of right foot ([place]) 07/25/2018  . Osteomyelitis ([place]) 07/25/2018  . Anasarca 05/25/2018  . Pressure injury of skin 05/25/2018  . CKD (chronic kidney disease), stage IV ([place]) 12/15/2016  . Ulcerated, foot ([place]) 12/15/2016  . Gangrene of foot ([place]) 08/21/2016  . HTN (hypertension) 08/21/2016  . Diabetes ([place]) 08/21/2016  . CAD (coronary artery disease) 08/21/2016  . Chronic systolic CHF (congestive heart failure) ([place]) 08/21/2016  . Diabetic foot infection ([place]) 08/21/2016  . Heterotopic ossification of bone 07/12/2014    Orientation RESPIRATION BLADDER Height & Weight     Self, Time, Situation, Place  Normal Continent Weight: 73.4 kg Height:  5\' 9"  (175.3 cm)  BEHAVIORAL SYMPTOMS/MOOD NEUROLOGICAL BOWEL NUTRITION STATUS      Continent Diet(carb modified)  AMBULATORY STATUS COMMUNICATION OF NEEDS Skin   Limited Assist Verbally Surgical wounds(right leg)                       Personal Care Assistance Level of Assistance   Bathing, Feeding, Dressing Bathing Assistance: Limited assistance Feeding assistance: Limited assistance Dressing Assistance: Limited assistance     Functional Limitations Info    Sight Info: Adequate Hearing Info: Adequate Speech Info: Adequate    SPECIAL CARE FACTORS FREQUENCY  PT (By licensed PT), OT (By licensed OT)(Dialysis)     PT Frequency: 5 x week OT Frequency: 3 x week            Contractures Contractures Info: Not present    Additional Factors Info  Allergies, Insulin Sliding Scale, Isolation Precautions(MRSA foot)   Allergies Info: NSAIDs/Ibuprofen, Baclofen, Metformin   Insulin Sliding Scale Info: See d/c order Isolation Precautions Info: MRSA foot     Current Medications (12/24/2018):  This is the current hospital active medication list Current Facility-Administered Medications  Medication Dose Route Frequency Provider Last Rate Last Dose  . 0.9 %  sodium chloride infusion (Manually program via Guardrails IV Fluids)   Intravenous Once Kolluru, Sarath, MD      . 0.9 %  sodium chloride infusion  250 mL Intravenous PRN Algernon Huxley, MD      . acetaminophen (TYLENOL) tablet 650 mg  650 mg Oral Q6H PRN Algernon Huxley, MD       Or  . acetaminophen (TYLENOL) suppository 650 mg  650 mg Rectal Q6H PRN Algernon Huxley, MD      . allopurinol (ZYLOPRIM) tablet 100 mg  100 mg Oral Daily Algernon Huxley, MD   100 mg at 12/22/18 0844  .  amiodarone (PACERONE) tablet 100 mg  100 mg Oral Daily Algernon Huxley, MD   100 mg at 12/22/18 0846  . calcium acetate (PHOSLO) capsule 1,334 mg  1,334 mg Oral TID WC Algernon Huxley, MD   1,334 mg at 12/24/18 0321  . carvedilol (COREG) tablet 3.125 mg  3.125 mg Oral BID Algernon Huxley, MD   Stopped at 12/24/18 [phone]  . cefTAZidime (FORTAZ) 1 g in sodium chloride 0.9 % 100 mL IVPB  1 g Intravenous Once per day on Tue Thu Dew, Jason S, MD      . cefTAZidime (FORTAZ) 2 g in sodium chloride 0.9 % 100 mL IVPB  2 g Intravenous Q Sat-HD Algernon Huxley, MD       . Chlorhexidine Gluconate Cloth 2 % PADS 6 each  6 each Topical G5003 Algernon Huxley, MD   6 each at 12/24/18 [phone]  . DAPTOmycin (CUBICIN) 1,000 mg in sodium chloride 0.9 % IVPB  1,000 mg Intravenous Q Sat-HD Algernon Huxley, MD      . DAPTOmycin (CUBICIN) 750 mg in sodium chloride 0.9 % IVPB  750 mg Intravenous Once per day on Tue Thu Dew, Jason S, MD 230 mL/hr at 12/22/18 1800 750 mg at 12/22/18 1800  . dicyclomine (BENTYL) capsule 10 mg  10 mg Oral TID AC & HS Algernon Huxley, MD   10 mg at 12/24/18 0820  . ferrous sulfate tablet 325 mg  325 mg Oral BID WC Algernon Huxley, MD   325 mg at 12/24/18 0746  . gabapentin (NEURONTIN) capsule 100 mg  100 mg Oral TID Algernon Huxley, MD   100 mg at 12/24/18 0820  . heparin injection 5,000 Units  5,000 Units Subcutaneous Q8H Algernon Huxley, MD      . HYDROcodone-acetaminophen (NORCO/VICODIN) 5-325 MG per tablet 1 tablet  1 tablet Oral Q6H PRN Harrie Foreman, MD   1 tablet at 12/24/18 1322  . insulin aspart (novoLOG) injection 0-5 Units  0-5 Units Subcutaneous QHS Lance Coon, MD   2 Units at 12/23/18 2110  . insulin aspart (novoLOG) injection 0-9 Units  0-9 Units Subcutaneous TID WC Lance Coon, MD   2 Units at 12/24/18 0830  . loperamide (IMODIUM) capsule 2 mg  2 mg Oral Q8H PRN Algernon Huxley, MD      . metroNIDAZOLE (FLAGYL) IVPB 500 mg  500 mg Intravenous Q12H Algernon Huxley, MD 100 mL/hr at 12/24/18 1044 500 mg at 12/24/18 1044  . morphine 2 MG/ML injection 1 mg  1 mg Intravenous Q4H PRN Harrie Foreman, MD   1 mg at 12/24/18 0945  . ondansetron (ZOFRAN) tablet 4 mg  4 mg Oral Q6H PRN Algernon Huxley, MD       Or  . ondansetron (ZOFRAN) injection 4 mg  4 mg Intravenous Q6H PRN Algernon Huxley, MD      . predniSONE (DELTASONE) tablet 5 mg  5 mg Oral Q breakfast Algernon Huxley, MD   5 mg at 12/24/18 0745  . sodium chloride flush (NS) 0.9 % injection 3 mL  3 mL Intravenous Q12H Algernon Huxley, MD   3 mL at 12/24/18 0832  . sodium chloride flush (NS) 0.9 % injection 3 mL   3 mL Intravenous PRN Lucky Cowboy Erskine Squibb, MD         Discharge Medications: Please see discharge summary for a list of discharge medications.  Relevant Imaging Results:  Relevant Lab Results:  Additional Information ss# [id number]  Marshell Garfinkel, RN

## 2018-12-24 NOTE — Progress Notes (Signed)
Hd started  

## 2018-12-25 ENCOUNTER — Inpatient Hospital Stay: Payer: Medicare Other

## 2018-12-25 LAB — GLUCOSE, CAPILLARY
Glucose-Capillary: 87 mg/dL (ref 70–99)
Glucose-Capillary: 156 mg/dL — ABNORMAL HIGH (ref 70–99)
Glucose-Capillary: 147 mg/dL — ABNORMAL HIGH (ref 70–99)
Glucose-Capillary: 147 mg/dL — ABNORMAL HIGH (ref 70–99)

## 2018-12-25 NOTE — Progress Notes (Signed)
Physical Therapy Treatment Patient Details Name: Paul [name]. MRN: [id number] DOB: May 29, 1965 Today's Date: 12/25/2018    History of Present Illness 53 y/o male here for non-healing wound to R heel, now referred to PT for his new R BK amputation performed on 12/23/18.  PMHx:  osteomyelitis, ESRD, PAF, CHF, DM, gout, GI symptoms      PT Comments    Discussed with MD Kolluru.  OK'ed for therapy today stating lab values were from yesterday.  Arrived at pt room.  Pt on commode upon arrival and requested 2 minutes.  While donning PPE in hallway I heard pt fall and yell out.  I called for help and entered room as PPE was donned.  Pt on floor at edge of bed.  Stated he was trying to stand to get his pants up and back to bed.  He did not call for help or wait for assistance.  Stated he had gotten himself up to the commode.  Educated on need to wait for assist for safety.  Stated LLE slid out from under him while trying to pull up his pants.  Initially denied hitting his head to avoid any further testing or intervention but upon further questioning stated he did.  Stated he landed on R thigh and hit incision area.  Pt was assisted back to bed with max A x 2 to stand from floor then stood again to pull up his pants with mod a x 2.  Pt with no c/o orthopaedic pain.  RN and charge RN in room with pt.  He was again educated on calling for assist and not trying to stand without staff.  He did not have his walker at the time.  Pt aware he needs further intervention and SNF is appropriate upon discharge.   Follow Up Recommendations  SNF     Equipment Recommendations  Rolling walker with 5" wheels;3in1 (PT)    Recommendations for Other Services       Precautions / Restrictions Precautions Precautions: Fall Precaution Comments: NWB on RLE Restrictions Weight Bearing Restrictions: Yes RLE Weight Bearing: Non weight bearing    Mobility  Bed Mobility Overal bed mobility: Modified Independent                 Transfers                 General transfer comment: +2 max a from floor.  Ambulation/Gait                 Stairs             Wheelchair Mobility    Modified Rankin (Stroke Patients Only)       Balance                                            Cognition Arousal/Alertness: Awake/alert Behavior During Therapy: WFL for tasks assessed/performed;Impulsive Overall Cognitive Status: Within Functional Limits for tasks assessed                                        Exercises      General Comments        Pertinent Vitals/Pain Pain Assessment: Faces Faces Pain Scale: Hurts little more Pain Location: R leg - hit lateral side and  stump during fall Pain Descriptors / Indicators: Sore    Home Living                      Prior Function            PT Goals (current goals can now be found in the care plan section) Progress towards PT goals: Progressing toward goals    Frequency    7X/week      PT Plan Current plan remains appropriate    Co-evaluation              AM-PAC PT "6 Clicks" Mobility   Outcome Measure  Help needed turning from your back to your side while in a flat bed without using bedrails?: None Help needed moving from lying on your back to sitting on the side of a flat bed without using bedrails?: A Little Help needed moving to and from a bed to a chair (including a wheelchair)?: A Little Help needed standing up from a chair using your arms (e.g., wheelchair or bedside chair)?: A Lot Help needed to walk in hospital room?: A Lot Help needed climbing 3-5 steps with a railing? : Total 6 Click Score: 15    End of Session Equipment Utilized During Treatment: Gait belt Activity Tolerance: Other (comment) Patient left: in bed;with call bell/phone within reach;with bed alarm set;with nursing/sitter in room Nurse Communication: Other (comment)       Time:  4827-0786 PT Time Calculation (min) (ACUTE ONLY): 10 min  Charges:  $Therapeutic Activity: 8-22 mins                    Chesley Noon, PTA 12/25/18, 10:44 AM

## 2018-12-25 NOTE — Progress Notes (Signed)
Patient was in room on bedside commode, PT told him to wait while she gowned up but he tried to get up by himself and fell. We heard him yell "AhhhhCivil engineer, contracting and both RNs on the floor as well as NT ran to patient's room. He was in sitting position on the floor stating that he was only trying to wipe his bottom. He states that he did hit his head. Asked him if he now understood why he needs the bed alarm and why he should ask for help and he stated "I'm not asking for nothing when it takes yall 15 minutes to come". He states that he doesn't want his mother to know that he fell because she worries. AC notified, Post vitals taken, and MD [place] notified.

## 2018-12-25 NOTE — Progress Notes (Signed)
[name]    MR#:  [id number]  DATE OF BIRTH:  1965/09/21  SUBJECTIVE:  CHIEF COMPLAINT:   Chief Complaint  Patient presents with  . Wound Check   Came with foot infection. 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  Able to support himself with a walker, had a fall in room today.  REVIEW OF SYSTEMS:  CONSTITUTIONAL: No fever, fatigue or weakness.  EYES: No blurred or double vision.  EARS, NOSE, AND THROAT: No tinnitus or ear pain.  RESPIRATORY: No cough, shortness of breath, wheezing or hemoptysis.  CARDIOVASCULAR: No chest pain, orthopnea, edema.  GASTROINTESTINAL: No nausea, vomiting, diarrhea or abdominal pain.  GENITOURINARY: No dysuria, hematuria.  ENDOCRINE: No polyuria, nocturia,  HEMATOLOGY: No anemia, easy bruising or bleeding SKIN: No rash or lesion. MUSCULOSKELETAL: No joint pain or arthritis.   NEUROLOGIC: No tingling, numbness, weakness.  PSYCHIATRY: No anxiety or depression.   Review of Systems  Neurological: Seizures: BKA done 12/23/18.    DRUG ALLERGIES:   Allergies  Allergen Reactions  . Other Other (See Comments)    Cardiac Problems. Pt states he tolerates Toradol. Due to kidney and heart problems per pt  . Ibuprofen Other (See Comments)    Heart problems  . Baclofen Other (See Comments)  . Metformin Diarrhea  . Nsaids     Due to kidney and heart problems per pt    VITALS:  Blood pressure 137/66, pulse 97, temperature 97.7 F (36.5 C), temperature source Oral, resp. rate 18, height 5\' 9"  (1.753 m), weight 87.1 kg, SpO2 100 %.  PHYSICAL EXAMINATION:   GENERAL:  53 y.o.-year-old patient lying in the bed with no acute distress.  EYES: Pupils equal, round, reactive to light and accommodation. No scleral icterus. Extraocular muscles intact.  HEENT: Head atraumatic, normocephalic. Oropharynx and nasopharynx clear.  NECK:  Supple, no jugular venous distention. No thyroid enlargement, no tenderness.   LUNGS: Normal breath sounds bilaterally, no wheezing, rales,rhonchi or crepitation. No use of accessory muscles of respiration.  CARDIOVASCULAR: S1, S2 normal. No murmurs, rubs, or gallops.  ABDOMEN: Soft, nontender, nondistended. Bowel sounds present. No organomegaly or mass.  EXTREMITIES: No pedal edema, cyanosis, or clubbing. right BKA NEUROLOGIC: Cranial nerves II through XII are intact. Muscle strength 5/5 in all extremities. Sensation intact. Gait not checked.  PSYCHIATRIC: The patient is alert and oriented x 3.  SKIN: No obvious rash, lesion, or ulcer.   Physical Exam LABORATORY PANEL:   CBC Recent Labs  Lab 12/24/18 0701  WBC 15.6*  HGB 6.8*  HCT 22.7*  PLT 399   ------------------------------------------------------------------------------------------------------------------  Chemistries  Recent Labs  Lab 12/21/18 1512  12/23/18 0442 12/24/18 0701  NA 137   < > 138 134*  K 4.2   < > 4.6 5.7*  CL 104   < > 102 100  CO2 22   < > 29 24  GLUCOSE 208*   < > 133* 185*  BUN 28*   < > 22* 40*  CREATININE 7.16*   < > 6.03* 7.88*  CALCIUM 7.9*   < > 8.4* 7.8*  MG  --   --  2.3  --   AST 16  --   --   --   ALT 11  --   --   --   ALKPHOS 108  --   --   --   BILITOT 0.4  --   --   --    < > =  values in this interval not displayed.   ------------------------------------------------------------------------------------------------------------------  Cardiac Enzymes No results for input(s): TROPONINI in the last 168 hours. ------------------------------------------------------------------------------------------------------------------  RADIOLOGY:  No results found.  ASSESSMENT AND PLAN:   Active Problems:   Right foot infection  1. 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  Podiatry has discussed below-knee amputation. we will continue same antibiotics as he was doing with dialysis. BKA done 12/23/18 Called ID to help decide abx choice. As per ID, may stop ABx now  after 2 days of amputation 2. End-stage renal disease on hemodialysis Tuesday Thursday and Saturday.   Nephrology has been consulted. 3. Paroxysmal atrial fibrillation. Hold Plavix since the patient may end up needing her procedure.   Hold aspirin as well 4. Chronic systolic congestive heart failure. Dialysis to manage fluid 5. Gout bilateral hands allopurinol and oral prednisone 5 mg daily 6. Type 2 diabetes mellitus on diet control only. 7. Chronic loose stools continue same home oral regimen 8. Fall in room- he said- hit his head.    Will get CT head.   All the records are reviewed and case discussed with Care Management/Social Workerr. Management plans discussed with the patient, family and they are in agreement.  CODE STATUS: Full.  TOTAL TIME TAKING CARE OF THIS PATIENT: 35 minutes.    POSSIBLE D/C IN 1-2 DAYS, DEPENDING ON CLINICAL CONDITION.   Paul [name] M.D on 12/25/2018   Between 7am to 6pm - Pager - [phone]  After 6pm go to [email or web address] - password [email or web address] [facility]  Office  [phone]  CC: Primary care physician; Paul Harrier, MD  Note: This dictation was prepared with Dragon dictation along with smaller phrase technology. Any transcriptional errors that result from this process are unintentional.

## 2018-12-25 NOTE — Progress Notes (Signed)
Patient state he never takes heparin.

## 2018-12-25 NOTE — Progress Notes (Signed)
[facility]  ROUNDING NOTE   Subjective:   Hemodialysis treatment yesterday. Tolerated treatment well. Treatment done in bed. UF of 1740mL.   PRBC transfusion 1 unit yesterday with HD treatment.   Patient fell this morning when trying to transfer from the bed to bedside commode.   Objective:  Vital signs in last 24 hours:  Temp:  [97.8 F (36.6 C)-98.9 F (37.2 C)] 98.1 F (36.7 C) (08/23 1130) Pulse Rate:  [76-88] 78 (08/23 1130) Resp:  [10-24] 18 (08/23 1130) BP: (107-150)/(58-108) 111/76 (08/23 1130) SpO2:  [97 %-100 %] 97 % (08/23 1130) Weight:  [87.1 kg] 87.1 kg (08/22 1452)  Weight change: -2.9 kg Filed Weights   12/23/18 1339 12/24/18 0434 12/24/18 1452  Weight: 90 kg 73.4 kg 87.1 kg    Intake/Output: I/O last 3 completed shifts: In: 468.5 [IV ELFYBOFBP:102.5] Out: 8527 [Other:1783]   Intake/Output this shift:  No intake/output data recorded.  Physical Exam: General: No acute distress, laying in bed  Head: Normocephalic, atraumatic. Moist oral mucosal membranes  Eyes: Anicteric  Neck: Supple   Lungs:  Clear to auscultation, normal effort  Heart: regular  Abdomen:  Soft, nontender, bowel sounds present  Extremities: Left foot 1+ edema, right BKA with clean and dry dressings.   Neurologic: Awake, alert, following commands  Skin: No lesions  Access: IJ PermCath.    Basic Metabolic Panel: Recent Labs  Lab 12/21/18 1512 12/22/18 0413 12/23/18 0442 12/24/18 0701  NA 137 140 138 134*  K 4.2 4.6 4.6 5.7*  CL 104 106 102 100  CO2 22 24 29 24   GLUCOSE 208* 106* 133* 185*  BUN 28* 30* 22* 40*  CREATININE 7.16* 8.31* 6.03* 7.88*  CALCIUM 7.9* 7.9* 8.4* 7.8*  MG  --   --  2.3  --   PHOS  --   --   --  5.2*    Liver Function Tests: Recent Labs  Lab 12/21/18 1512 12/24/18 0701  AST 16  --   ALT 11  --   ALKPHOS 108  --   BILITOT 0.4  --   PROT 7.1  --   ALBUMIN 2.9* 2.4*   No results for input(s): LIPASE, AMYLASE in the last 168  hours. No results for input(s): AMMONIA in the last 168 hours.  CBC: Recent Labs  Lab 12/21/18 1512 12/22/18 0413 12/23/18 0442 12/23/18 1712 12/24/18 0701  WBC 10.9* 9.3 9.9 12.4* 15.6*  NEUTROABS 9.0*  --   --   --   --   HGB 7.7* 7.1* 8.2* 7.7* 6.8*  HCT 27.1* 24.5* 28.1* 26.2* 22.7*  MCV 94.8 93.5 92.4 92.6 91.5  PLT 382 393 432* 414* 399    Cardiac Enzymes: Recent Labs  Lab 12/22/18 1200 12/23/18 0442  CKTOTAL 34* 29*    BNP: Invalid input(s): POCBNP  CBG: Recent Labs  Lab 12/24/18 1304 12/24/18 1643 12/24/18 2058 12/25/18 0814 12/25/18 1207  GLUCAP 148* 154* 193* 87 156*    Microbiology: Results for orders placed or performed during the hospital encounter of 12/21/18  SARS Coronavirus 2 [facility] order, Performed in [facility] hospital lab) Nasopharyngeal Nasopharyngeal Swab     Status: None   Collection Time: 12/21/18  5:14 PM   Specimen: Nasopharyngeal Swab  Result Value Ref Range Status   SARS Coronavirus 2 NEGATIVE NEGATIVE Final    Comment: (NOTE) If result is NEGATIVE SARS-CoV-2 target nucleic acids are NOT DETECTED. The SARS-CoV-2 RNA is generally detectable in upper and lower  respiratory specimens  during the acute phase of infection. The lowest  concentration of SARS-CoV-2 viral copies this assay can detect is 250  copies / mL. A negative result does not preclude SARS-CoV-2 infection  and should not be used as the sole basis for treatment or other  patient management decisions.  A negative result may occur with  improper specimen collection / handling, submission of specimen other  than nasopharyngeal swab, presence of viral mutation(s) within the  areas targeted by this assay, and inadequate number of viral copies  (<250 copies / mL). A negative result must be combined with clinical  observations, patient history, and epidemiological information. If result is POSITIVE SARS-CoV-2 target nucleic acids are DETECTED. The SARS-CoV-2 RNA is  generally detectable in upper and lower  respiratory specimens dur ing the acute phase of infection.  Positive  results are indicative of active infection with SARS-CoV-2.  Clinical  correlation with patient history and other diagnostic information is  necessary to determine patient infection status.  Positive results do  not rule out bacterial infection or co-infection with other viruses. If result is PRESUMPTIVE POSTIVE SARS-CoV-2 nucleic acids MAY BE PRESENT.   A presumptive positive result was obtained on the submitted specimen  and confirmed on repeat testing.  While 2019 novel coronavirus  (SARS-CoV-2) nucleic acids may be present in the submitted sample  additional confirmatory testing may be necessary for epidemiological  and / or clinical management purposes  to differentiate between  SARS-CoV-2 and other Sarbecovirus currently known to infect humans.  If clinically indicated additional testing with an alternate test  methodology [phone]) is advised. The SARS-CoV-2 RNA is generally  detectable in upper and lower respiratory sp ecimens during the acute  phase of infection. The expected result is Negative. Fact Sheet for Patients:  StrictlyIdeas.no Fact Sheet for Healthcare Providers: BankingDealers.co.za This test is not yet approved or cleared by the [place] FDA and has been authorized for detection and/or diagnosis of SARS-CoV-2 by FDA under an Emergency Use Authorization (EUA).  This EUA will remain in effect (meaning this test can be used) for the duration of the COVID-19 declaration under Section 564(b)(1) of the Act, 21 U.S.C. section 360bbb-3(b)(1), unless the authorization is terminated or revoked sooner. Performed at [facility], [address] [place], [place] [address]   Aerobic/Anaerobic Culture (surgical/deep wound)     Status: None (Preliminary result)   Collection Time: 12/22/18  8:00 AM    Specimen: Wound  Result Value Ref Range Status   Specimen Description   Final    WOUND Performed at [facility], [address] [place], [place] [address]    Special Requests   Final    RIGHT FOOT Performed at [facility], [address] [place], [place] [address]    Gram Stain   Final    RARE WBC PRESENT, PREDOMINANTLY PMN FEW GRAM VARIABLE ROD Performed at [facility], [address] [place],  [address]    Culture   Final    ABUNDANT MULTIPLE ORGANISMS PRESENT, NONE PREDOMINANT NO ANAEROBES ISOLATED; CULTURE IN PROGRESS FOR 5 DAYS    Report Status PENDING  Incomplete  CULTURE, BLOOD (ROUTINE X 2) w Reflex to ID Panel     Status: None (Preliminary result)   Collection Time: 12/22/18  9:20 PM   Specimen: BLOOD  Result Value Ref Range Status   Specimen Description BLOOD LEFT ASSIST CONTROL  Final   Special Requests   Final    BOTTLES DRAWN  AEROBIC AND ANAEROBIC Blood Culture adequate volume   Culture   Final    NO GROWTH 3 DAYS Performed at [facility], [address] [place], [place] [address]    Report Status PENDING  Incomplete  CULTURE, BLOOD (ROUTINE X 2) w Reflex to ID Panel     Status: None (Preliminary result)   Collection Time: 12/22/18  9:20 PM   Specimen: BLOOD  Result Value Ref Range Status   Specimen Description BLOOD RIGHT ARM  Final   Special Requests   Final    BOTTLES DRAWN AEROBIC AND ANAEROBIC Blood Culture adequate volume   Culture   Final    NO GROWTH 3 DAYS Performed at [facility], [address], [place], [place] [address]    Report Status PENDING  Incomplete  MRSA PCR Screening     Status: None   Collection Time: 12/23/18 11:01 AM   Specimen: Nasopharyngeal  Result Value Ref Range Status   MRSA by PCR NEGATIVE NEGATIVE Final    Comment:        The GeneXpert MRSA Assay (FDA approved for NASAL specimens only), is one component of a comprehensive MRSA colonization surveillance  program. It is not intended to diagnose MRSA infection nor to guide or monitor treatment for MRSA infections. Performed at [facility], [address] [place], [place] [address]     Coagulation Studies: Recent Labs    12/23/18 0442  LABPROT 14.2  INR 1.1    Urinalysis: No results for input(s): COLORURINE, LABSPEC, PHURINE, GLUCOSEU, HGBUR, BILIRUBINUR, KETONESUR, PROTEINUR, UROBILINOGEN, NITRITE, LEUKOCYTESUR in the last 72 hours.  Invalid input(s): APPERANCEUR    Imaging: No results found.   Medications:   . sodium chloride    . cefTAZidime (FORTAZ)  IV    . cefTAZidime (FORTAZ)  IV 2 g (12/24/18 1536)  . DAPTOmycin (CUBICIN)  IV 240 mL/hr at 12/24/18 1940  . DAPTOmycin (CUBICIN)  IV 750 mg (12/22/18 1800)  . metronidazole 500 mg (12/25/18 0830)   . sodium chloride   Intravenous Once  . allopurinol  100 mg Oral Daily  . amiodarone  100 mg Oral Daily  . aspirin  81 mg Oral Daily  . calcium acetate  1,334 mg Oral TID WC  . carvedilol  3.125 mg Oral BID  . Chlorhexidine Gluconate Cloth  6 each Topical Q0600  . clopidogrel  75 mg Oral Daily  . dicyclomine  10 mg Oral TID AC & HS  . ferrous sulfate  325 mg Oral BID WC  . gabapentin  100 mg Oral TID  . heparin  5,000 Units Subcutaneous Q8H  . insulin aspart  0-5 Units Subcutaneous QHS  . insulin aspart  0-9 Units Subcutaneous TID WC  . predniSONE  5 mg Oral Q breakfast  . sodium chloride flush  3 mL Intravenous Q12H   sodium chloride, acetaminophen **OR** acetaminophen, HYDROcodone-acetaminophen, loperamide, morphine injection, ondansetron **OR** ondansetron (ZOFRAN) IV, sodium chloride flush  Assessment/ Plan:  [name] is a 53 y.o. black male with type 2 diabetes mellitus insulin dependent, diabetic neuropathy, hypertension, coronary artery disease status post CABG, systolic and diastolic congestive heart failure, status post AICD, carotid stenosis, stroke, obstructive sleep apnea,  peripheral vascular disease, gout, osteomyelitis of heel. Underwent right BKA by Dr. 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   [place]   1.  ESRD on HD:  Hemodialysis treatment yesterday.  - Continue TTS schedule  2. Hypertension:  - Continue  carvedilol.   3. Anemia of chronic kidney disease:  Status post PRBC transfusion on 8/22.  - EPO with HD treatment.   4. Secondary Hyperparathyroidism: not currently on binders.   5. Diabetes mellitus type II with chronic kidney disease: history of poor control.   6. 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 Dr. Lucky Cowboy.  - As per ID, antibiotics may be discontinued post op 48-72 hours.  - Appreciate vascular, podiatry and infectious disease input.    LOS: 4 Stanly Si 8/23/202012:13 PM

## 2018-12-25 NOTE — Progress Notes (Addendum)
PT Cancellation Note  Patient Details Name: Paul [name]. MRN: [id number] DOB: [birth year]   Cancelled Treatment:    Reason Eval/Treat Not Completed: Medical issues which prohibited therapy   Potassium 5.7 this am.  HgB 6.8.  Will hold per therapy protocols and continue as appropriate.   Chesley Noon 12/25/2018, 8:05 AM

## 2018-12-25 NOTE — Progress Notes (Signed)
Physical Therapy Treatment Patient Details Name: Paul [name]. MRN: [id number] DOB: [birth year] Today's Date: 12/25/2018    History of Present Illness 53 y/o male here for non-healing wound to R heel, now referred to PT for his new R BK amputation performed on 12/23/18.  PMHx:  osteomyelitis, ESRD, PAF, CHF, DM, gout, GI symptoms      PT Comments    Pt in bed, feeling ok after fall, wanting to participate.  Awaiting head CT.  Stated he is dizzy but feels it is medication related since he has been dizzy all day and not fall related.  Participated in exercises as described below.  No bleeding noted on dressing or pain with movement.  To edge of bed with min guard.  Session focused on education on transfers and safety.  Discussed option for lateral scoot transfers to/from recliner and commode if he was home alone or dizziness prevented safe standing.  He was able to lateral scoot left and right length of bed before limited by dizziness increasing from 3/10 to 6/10 with seated activity.  Pt stated he was dizzy earlier when he got up to the commode unassisted.  Re-enforced safety and need to call for assist.   Follow Up Recommendations  SNF     Equipment Recommendations  Rolling walker with 5" wheels;3in1 (PT)    Recommendations for Other Services       Precautions / Restrictions Precautions Precautions: Fall Precaution Comments: NWB on RLE Restrictions Weight Bearing Restrictions: Yes RLE Weight Bearing: Non weight bearing Other Position/Activity Restrictions: dizziness ? medication related.    Mobility  Bed Mobility Overal bed mobility: Modified Independent                Transfers Overall transfer level: Needs assistance   Transfers: Lateral/Scoot Transfers          Lateral/Scoot Transfers: Supervision General transfer comment: +2 max a from floor.  Ambulation/Gait                 Stairs             Wheelchair Mobility    Modified Rankin  (Stroke Patients Only)       Balance                                            Cognition Arousal/Alertness: Awake/alert Behavior During Therapy: WFL for tasks assessed/performed;Impulsive Overall Cognitive Status: Within Functional Limits for tasks assessed                                        Exercises Other Exercises Other Exercises: supine LLE amp ex including quad sets, SLR, Ab/add, hip.knee flexion and SAQ 2 x 10 in supine    General Comments        Pertinent Vitals/Pain Pain Assessment: No/denies pain Faces Pain Scale: Hurts little more Pain Location: R leg - hit lateral side and stump during fall Pain Descriptors / Indicators: Sore    Home Living                      Prior Function            PT Goals (current goals can now be found in the care plan section) Progress towards PT goals: Progressing toward  goals    Frequency    7X/week      PT Plan Current plan remains appropriate    Co-evaluation              AM-PAC PT "6 Clicks" Mobility   Outcome Measure  Help needed turning from your back to your side while in a flat bed without using bedrails?: None Help needed moving from lying on your back to sitting on the side of a flat bed without using bedrails?: A Little Help needed moving to and from a bed to a chair (including a wheelchair)?: A Little Help needed standing up from a chair using your arms (e.g., wheelchair or bedside chair)?: A Lot Help needed to walk in hospital room?: A Lot Help needed climbing 3-5 steps with a railing? : Total 6 Click Score: 15    End of Session Equipment Utilized During Treatment: Gait belt Activity Tolerance: Treatment limited secondary to medical complications (Comment) Patient left: in bed;with call bell/phone within reach;with bed alarm set;with nursing/sitter in room Nurse Communication: Other (comment)       Time: 3500-9381 PT Time Calculation (min)  (ACUTE ONLY): 18 min  Charges:  $Therapeutic Exercise: 8-22 mins $Therapeutic Activity: 8-22 mins                     Chesley Noon, PTA 12/25/18, 11:44 AM

## 2018-12-26 DIAGNOSIS — G546 Phantom limb syndrome with pain: Secondary | ICD-10-CM

## 2018-12-26 DIAGNOSIS — D649 Anemia, unspecified: Secondary | ICD-10-CM

## 2018-12-26 DIAGNOSIS — L97413 Non-pressure chronic ulcer of right heel and midfoot with necrosis of muscle: Secondary | ICD-10-CM

## 2018-12-26 DIAGNOSIS — L089 Local infection of the skin and subcutaneous tissue, unspecified: Secondary | ICD-10-CM

## 2018-12-26 DIAGNOSIS — Z89511 Acquired absence of right leg below knee: Secondary | ICD-10-CM

## 2018-12-26 DIAGNOSIS — E11628 Type 2 diabetes mellitus with other skin complications: Secondary | ICD-10-CM

## 2018-12-26 LAB — CBC
HCT: 23.7 % — ABNORMAL LOW (ref 39.0–52.0)
Hemoglobin: 6.9 g/dL — ABNORMAL LOW (ref 13.0–17.0)
MCH: 27.3 pg (ref 26.0–34.0)
MCHC: 29.1 g/dL — ABNORMAL LOW (ref 30.0–36.0)
MCV: 93.7 fL (ref 80.0–100.0)
Platelets: 352 10*3/uL (ref 150–400)
RBC: 2.53 MIL/uL — ABNORMAL LOW (ref 4.22–5.81)
RDW: 18.2 % — ABNORMAL HIGH (ref 11.5–15.5)
WBC: 13 10*3/uL — ABNORMAL HIGH (ref 4.0–10.5)
nRBC: 0.2 % (ref 0.0–0.2)

## 2018-12-26 LAB — GLUCOSE, CAPILLARY
Glucose-Capillary: 98 mg/dL (ref 70–99)
Glucose-Capillary: 130 mg/dL — ABNORMAL HIGH (ref 70–99)
Glucose-Capillary: 177 mg/dL — ABNORMAL HIGH (ref 70–99)
Glucose-Capillary: 131 mg/dL — ABNORMAL HIGH (ref 70–99)

## 2018-12-26 LAB — PREPARE RBC (CROSSMATCH)

## 2018-12-26 MED ORDER — SODIUM CHLORIDE 0.9% IV SOLUTION: Freq: Once | INTRAVENOUS | Status: DC

## 2018-12-26 NOTE — Progress Notes (Signed)
PT Cancellation Note  Patient Details Name: Paul [name]. MRN: [id number] DOB: [birth year]   Cancelled Treatment:    Reason Eval/Treat Not Completed: Medical issues which prohibited therapy. Hgb currently 6.9. Re attempt when lab values stable.    Larae Grooms, PTA 12/26/2018, 1:35 PM

## 2018-12-26 NOTE — TOC Progression Note (Signed)
Transition of Care (TOC) - Progression Note    Patient Details  Name: Paul [name]. MRN: [id number] Date of Birth: 1965-05-11  Transition of Care [facility]) CM/SW Contact  [name], RN Phone Number: 12/26/2018, 9:29 AM  Clinical Narrative:    The patient has no bed days left due to not having a 60 day break.  He is not going to be able to go to SNF. He is aware  Expected Discharge Plan: [facility]    Expected Discharge Plan and Services Expected Discharge Plan: [place] Choice: [place] Living arrangements for the past 2 months: Single Family Home Expected Discharge Date: 12/23/18                                     Social Determinants of Health (SDOH) Interventions    Readmission Risk Interventions Readmission Risk Prevention Plan 11/25/2018 10/06/2018 09/09/2018  Transportation Screening Complete Complete Complete  PCP or Specialist Appt within 3-5 Days Complete Complete Complete  HRI or Home Care Consult Complete Complete Complete  Social Work Consult for Rafael Gonzalez Planning/Counseling Complete Complete Not Complete  SW consult not completed comments - - NA  Palliative Care Screening Not Applicable Not Applicable Not Applicable  Medication Review (RN Care Manager) Complete - -  Some recent data might be hidden

## 2018-12-26 NOTE — Progress Notes (Signed)
Clinic called back and stated that clinic has a MWF 06:30 chair time. Patient accepted this chair time.

## 2018-12-26 NOTE — Discharge Instructions (Signed)
Vascular Surgery Discharge Instructions Please place Xeroform to staple / incision line.   Cover incision line with ABD.   Cover stump with dry Kerlix.   Cover Kerlix with Ace bandage.  Encourage flexibility at the knee joint to avoid contracture.

## 2018-12-26 NOTE — Progress Notes (Signed)
[facility] NAME: Paul [name]    MR#:  [id number]  DATE OF BIRTH:  [birth year]  SUBJECTIVE:  CHIEF COMPLAINT:   Chief Complaint  Patient presents with  . Wound Check   Came with foot infection. 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   REVIEW OF SYSTEMS:  CONSTITUTIONAL: No fever, fatigue or weakness.  EYES: No blurred or double vision.  EARS, NOSE, AND THROAT: No tinnitus or ear pain.  RESPIRATORY: No cough, shortness of breath, wheezing or hemoptysis.  CARDIOVASCULAR: No chest pain, orthopnea, edema.  GASTROINTESTINAL: No nausea, vomiting, diarrhea or abdominal pain.  GENITOURINARY: No dysuria, hematuria.  ENDOCRINE: No polyuria, nocturia,  HEMATOLOGY: No anemia, easy bruising or bleeding SKIN: No rash or lesion. MUSCULOSKELETAL: No joint pain or arthritis.   NEUROLOGIC: No tingling, numbness, weakness.  PSYCHIATRY: No anxiety or depression.   Review of Systems  Neurological: Seizures: BKA done 12/23/18.    DRUG ALLERGIES:   Allergies  Allergen Reactions  . Other Other (See Comments)    Cardiac Problems. Pt states he tolerates Toradol. Due to kidney and heart problems per pt  . Ibuprofen Other (See Comments)    Heart problems  . Baclofen Other (See Comments)  . Metformin Diarrhea  . Nsaids     Due to kidney and heart problems per pt    VITALS:  Blood pressure 128/88, pulse 78, temperature 98.2 F (36.8 C), resp. rate 17, height 5\' 9"  (1.753 m), weight 87.1 kg, SpO2 100 %.  PHYSICAL EXAMINATION:   GENERAL:  53 y.o.-year-old patient lying in the bed with no acute distress.  EYES: Pupils equal, round, reactive to light and accommodation. No scleral icterus. Extraocular muscles intact.  HEENT: Head atraumatic, normocephalic. Oropharynx and nasopharynx clear.  NECK:  Supple, no jugular venous distention. No thyroid enlargement, no tenderness.  LUNGS: Normal breath sounds bilaterally, no wheezing, rales,rhonchi or crepitation. No use  of accessory muscles of respiration.  CARDIOVASCULAR: S1, S2 normal. No murmurs, rubs, or gallops.  ABDOMEN: Soft, nontender, nondistended. Bowel sounds present. No organomegaly or mass.  EXTREMITIES: No pedal edema, cyanosis, or clubbing. right BKA NEUROLOGIC: Cranial nerves II through XII are intact. Muscle strength 5/5 in all extremities. Sensation intact. Gait not checked.  PSYCHIATRIC: The patient is alert and oriented x 3.  SKIN: No obvious rash, lesion, or ulcer.   Physical Exam LABORATORY PANEL:   CBC Recent Labs  Lab 12/26/18 0529  WBC 13.0*  HGB 6.9*  HCT 23.7*  PLT 352   ------------------------------------------------------------------------------------------------------------------  Chemistries  Recent Labs  Lab 12/21/18 1512  12/23/18 0442 12/24/18 0701  NA 137   < > 138 134*  K 4.2   < > 4.6 5.7*  CL 104   < > 102 100  CO2 22   < > 29 24  GLUCOSE 208*   < > 133* 185*  BUN 28*   < > 22* 40*  CREATININE 7.16*   < > 6.03* 7.88*  CALCIUM 7.9*   < > 8.4* 7.8*  MG  --   --  2.3  --   AST 16  --   --   --   ALT 11  --   --   --   ALKPHOS 108  --   --   --   BILITOT 0.4  --   --   --    < > = values in this interval not displayed.   ------------------------------------------------------------------------------------------------------------------  Cardiac Enzymes No results  for input(s): TROPONINI in the last 168 hours. ------------------------------------------------------------------------------------------------------------------  RADIOLOGY:  Ct Head Wo Contrast  Result Date: 12/25/2018 CLINICAL DATA:  [place] this morning. Hit the back of the head. EXAM: CT HEAD WITHOUT CONTRAST TECHNIQUE: Contiguous axial images were obtained from the base of the skull through the vertex without intravenous contrast. COMPARISON:  01/06/2017, 03/31/2018 FINDINGS: Brain: No evidence of acute infarction, hemorrhage, hydrocephalus, extra-axial collection or mass lesion/mass  effect. Vascular: There is dense atherosclerotic calcification of the internal carotid arteries. No hyperdense vessels. Skull: Normal. Negative for fracture or focal lesion. Sinuses/Orbits: No acute finding. Other: None. IMPRESSION: No evidence for acute abnormality. Electronically Signed   By: Nolon Nations M.D.   On: 12/25/2018 15:16    ASSESSMENT AND PLAN:   Active Problems:   Right foot infection  1. 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  Podiatry has discussed below-knee amputation. we will continue same antibiotics as he was doing with dialysis. BKA done 12/23/18 Called ID to help decide abx choice. As per ID, may stop ABx now after 2 days of amputation 2. End-stage renal disease on hemodialysis Tuesday Thursday and Saturday.   Nephrology has been consulted. 3. Paroxysmal atrial fibrillation. Hold Plavix since the patient may end up needing her procedure.   Hold aspirin as well 4. Chronic systolic congestive heart failure. Dialysis to manage fluid 5. Gout bilateral hands allopurinol and oral prednisone 5 mg daily 6. Type 2 diabetes mellitus on diet control only. 7. Chronic loose stools continue same home oral regimen 8. Fall in room- he said- hit his head.    negative CT head.  9. Anemia of ESRD- transfuse with HD tomorrow.  PT had suggested SNF.  Patient has used his Medicare rehab days.  Now they are not approving any more rehab.  Case management is working on that and will also try to get maximum help available at home if he could not go to rehab He does have transportation issues on Saturday to go to his dialysis.  Case management can arrange for charity transportation from Monday to Friday, I talked to case management to see if his dialysis coordinator can switch his days on Monday Wednesday Friday so we can get the transportation properly.  All the records are reviewed and case discussed with Care Management/Social Workerr. Management plans discussed with the  patient, family and they are in agreement.  CODE STATUS: Full.  TOTAL TIME TAKING CARE OF THIS PATIENT: 35 minutes.    POSSIBLE D/C IN 1-2 DAYS, DEPENDING ON CLINICAL CONDITION.   Vaughan Basta M.D on 12/26/2018   Between 7am to 6pm - Pager - [phone]  After 6pm go to [email or web address] - password [email or web address] [facility]  Office  [phone]  CC: Primary care physician; Tracie Harrier, MD  Note: This dictation was prepared with Dragon dictation along with smaller phrase technology. Any transcriptional errors that result from this process are unintentional.

## 2018-12-26 NOTE — Progress Notes (Signed)
[facility]  ROUNDING NOTE   Subjective:   Doing fair Denies any acute c/o Hbg down to 6.9 Transfusion planned. Patient requesting it to be tomorrow with HD   Objective:  Vital signs in last 24 hours:  Temp:  [97.6 F (36.4 C)-98.4 F (36.9 C)] 98.2 F (36.8 C) (08/24 0804) Pulse Rate:  [74-97] 78 (08/24 0804) Resp:  [16-18] 17 (08/24 0804) BP: (97-137)/(61-88) 128/88 (08/24 0804) SpO2:  [97 %-100 %] 100 % (08/24 0804)  Weight change:  Filed Weights   12/23/18 1339 12/24/18 0434 12/24/18 1452  Weight: 90 kg 73.4 kg 87.1 kg    Intake/Output: No intake/output data recorded.   Intake/Output this shift:  No intake/output data recorded.  Physical Exam: General: No acute distress, laying in bed  Head: Normocephalic, atraumatic. Moist oral mucosal membranes  Eyes: Anicteric  Neck: Supple   Lungs:  Clear to auscultation, normal effort  Heart: regular  Abdomen:  Soft, nontender, bowel sounds present  Extremities: Left foot 1+ edema, right BKA with clean and dry dressings.   Neurologic: Awake, alert, following commands  Skin: No lesions  Access: IJ PermCath.    Basic Metabolic Panel: Recent Labs  Lab 12/21/18 1512 12/22/18 0413 12/23/18 0442 12/24/18 0701  NA 137 140 138 134*  K 4.2 4.6 4.6 5.7*  CL 104 106 102 100  CO2 22 24 29 24   GLUCOSE 208* 106* 133* 185*  BUN 28* 30* 22* 40*  CREATININE 7.16* 8.31* 6.03* 7.88*  CALCIUM 7.9* 7.9* 8.4* 7.8*  MG  --   --  2.3  --   PHOS  --   --   --  5.2*    Liver Function Tests: Recent Labs  Lab 12/21/18 1512 12/24/18 0701  AST 16  --   ALT 11  --   ALKPHOS 108  --   BILITOT 0.4  --   PROT 7.1  --   ALBUMIN 2.9* 2.4*   No results for input(s): LIPASE, AMYLASE in the last 168 hours. No results for input(s): AMMONIA in the last 168 hours.  CBC: Recent Labs  Lab 12/21/18 1512 12/22/18 0413 12/23/18 0442 12/23/18 1712 12/24/18 0701 12/26/18 0529  WBC 10.9* 9.3 9.9 12.4* 15.6* 13.0*   NEUTROABS 9.0*  --   --   --   --   --   HGB 7.7* 7.1* 8.2* 7.7* 6.8* 6.9*  HCT 27.1* 24.5* 28.1* 26.2* 22.7* 23.7*  MCV 94.8 93.5 92.4 92.6 91.5 93.7  PLT 382 393 432* 414* 399 352    Cardiac Enzymes: Recent Labs  Lab 12/22/18 1200 12/23/18 0442  CKTOTAL 34* 29*    BNP: Invalid input(s): POCBNP  CBG: Recent Labs  Lab 12/25/18 0814 12/25/18 1207 12/25/18 1720 12/25/18 2144 12/26/18 0802  GLUCAP 87 156* 147* 147* 48    Microbiology: Results for orders placed or performed during the hospital encounter of 12/21/18  SARS Coronavirus 2 [facility] order, Performed in [facility] hospital lab) Nasopharyngeal Nasopharyngeal Swab     Status: None   Collection Time: 12/21/18  5:14 PM   Specimen: Nasopharyngeal Swab  Result Value Ref Range Status   SARS Coronavirus 2 NEGATIVE NEGATIVE Final    Comment: (NOTE) If result is NEGATIVE SARS-CoV-2 target nucleic acids are NOT DETECTED. The SARS-CoV-2 RNA is generally detectable in upper and lower  respiratory specimens during the acute phase of infection. The lowest  concentration of SARS-CoV-2 viral copies this assay can detect is 250  copies / mL. A negative  result does not preclude SARS-CoV-2 infection  and should not be used as the sole basis for treatment or other  patient management decisions.  A negative result may occur with  improper specimen collection / handling, submission of specimen other  than nasopharyngeal swab, presence of viral mutation(s) within the  areas targeted by this assay, and inadequate number of viral copies  (<250 copies / mL). A negative result must be combined with clinical  observations, patient history, and epidemiological information. If result is POSITIVE SARS-CoV-2 target nucleic acids are DETECTED. The SARS-CoV-2 RNA is generally detectable in upper and lower  respiratory specimens dur ing the acute phase of infection.  Positive  results are indicative of active infection with SARS-CoV-2.   Clinical  correlation with patient history and other diagnostic information is  necessary to determine patient infection status.  Positive results do  not rule out bacterial infection or co-infection with other viruses. If result is PRESUMPTIVE POSTIVE SARS-CoV-2 nucleic acids MAY BE PRESENT.   A presumptive positive result was obtained on the submitted specimen  and confirmed on repeat testing.  While 2019 novel coronavirus  (SARS-CoV-2) nucleic acids may be present in the submitted sample  additional confirmatory testing may be necessary for epidemiological  and / or clinical management purposes  to differentiate between  SARS-CoV-2 and other Sarbecovirus currently known to infect humans.  If clinically indicated additional testing with an alternate test  methodology [phone]) is advised. The SARS-CoV-2 RNA is generally  detectable in upper and lower respiratory sp ecimens during the acute  phase of infection. The expected result is Negative. Fact Sheet for Patients:  StrictlyIdeas.no Fact Sheet for Healthcare Providers: BankingDealers.co.za This test is not yet approved or cleared by the [place] FDA and has been authorized for detection and/or diagnosis of SARS-CoV-2 by FDA under an Emergency Use Authorization (EUA).  This EUA will remain in effect (meaning this test can be used) for the duration of the COVID-19 declaration under Section 564(b)(1) of the Act, 21 U.S.C. section 360bbb-3(b)(1), unless the authorization is terminated or revoked sooner. Performed at [facility], [address] [place], [place] [address]   Aerobic/Anaerobic Culture (surgical/deep wound)     Status: None (Preliminary result)   Collection Time: 12/22/18  8:00 AM   Specimen: Wound  Result Value Ref Range Status   Specimen Description   Final    WOUND Performed at [facility], [address] [place], [place] [address]     Special Requests   Final    RIGHT FOOT Performed at [facility], [address] [place], [place] [address]    Gram Stain   Final    RARE WBC PRESENT, PREDOMINANTLY PMN FEW GRAM VARIABLE ROD Performed at [facility], [place] [address], [place], [place] [address]    Culture   Final    ABUNDANT MULTIPLE ORGANISMS PRESENT, NONE PREDOMINANT NO ANAEROBES ISOLATED; CULTURE IN PROGRESS FOR 5 DAYS    Report Status PENDING  Incomplete  CULTURE, BLOOD (ROUTINE X 2) w Reflex to ID Panel     Status: None (Preliminary result)   Collection Time: 12/22/18  9:20 PM   Specimen: BLOOD  Result Value Ref Range Status   Specimen Description BLOOD LEFT ASSIST CONTROL  Final   Special Requests   Final    BOTTLES DRAWN AEROBIC AND ANAEROBIC Blood Culture adequate volume   Culture   Final    NO GROWTH 3 DAYS Performed at [facility], [address] [place], [place] [address]    Report Status PENDING  Incomplete  CULTURE, BLOOD (ROUTINE X 2) w Reflex to ID Panel     Status: None (Preliminary result)   Collection Time: 12/22/18  9:20 PM   Specimen: BLOOD  Result Value Ref Range Status   Specimen Description BLOOD RIGHT ARM  Final   Special Requests   Final    BOTTLES DRAWN AEROBIC AND ANAEROBIC Blood Culture adequate volume   Culture   Final    NO GROWTH 3 DAYS Performed at [facility], [address] [place], [place] [address]    Report Status PENDING  Incomplete  MRSA PCR Screening     Status: None   Collection Time: 12/23/18 11:01 AM   Specimen: Nasopharyngeal  Result Value Ref Range Status   MRSA by PCR NEGATIVE NEGATIVE Final    Comment:        The GeneXpert MRSA Assay (FDA approved for NASAL specimens only), is one component of a comprehensive MRSA colonization surveillance program. It is not intended to diagnose MRSA infection nor to guide or monitor treatment for MRSA infections. Performed at [facility], [address]  [place], [place] [address]     Coagulation Studies: No results for input(s): LABPROT, INR in the last 72 hours.  Urinalysis: No results for input(s): COLORURINE, LABSPEC, PHURINE, GLUCOSEU, HGBUR, BILIRUBINUR, KETONESUR, PROTEINUR, UROBILINOGEN, NITRITE, LEUKOCYTESUR in the last 72 hours.  Invalid input(s): APPERANCEUR    Imaging: Ct Head Wo Contrast  Result Date: 12/25/2018 CLINICAL DATA:  [place] this morning. Hit the back of the head. EXAM: CT HEAD WITHOUT CONTRAST TECHNIQUE: Contiguous axial images were obtained from the base of the skull through the vertex without intravenous contrast. COMPARISON:  01/06/2017, 03/31/2018 FINDINGS: Brain: No evidence of acute infarction, hemorrhage, hydrocephalus, extra-axial collection or mass lesion/mass effect. Vascular: There is dense atherosclerotic calcification of the internal carotid arteries. No hyperdense vessels. Skull: Normal. Negative for fracture or focal lesion. Sinuses/Orbits: No acute finding. Other: None. IMPRESSION: No evidence for acute abnormality. Electronically Signed   By: Nolon Nations M.D.   On: 12/25/2018 15:16     Medications:   . sodium chloride    . cefTAZidime (FORTAZ)  IV 2 g (12/24/18 1536)  . DAPTOmycin (CUBICIN)  IV 240 mL/hr at 12/24/18 1940   . sodium chloride   Intravenous Once  . sodium chloride   Intravenous Once  . allopurinol  100 mg Oral Daily  . amiodarone  100 mg Oral Daily  . aspirin  81 mg Oral Daily  . calcium acetate  1,334 mg Oral TID WC  . carvedilol  3.125 mg Oral BID  . Chlorhexidine Gluconate Cloth  6 each Topical Q0600  . clopidogrel  75 mg Oral Daily  . dicyclomine  10 mg Oral TID AC & HS  . ferrous sulfate  325 mg Oral BID WC  . gabapentin  100 mg Oral TID  . heparin  5,000 Units Subcutaneous Q8H  . insulin aspart  0-5 Units Subcutaneous QHS  . insulin aspart  0-9 Units Subcutaneous TID WC  . predniSONE  5 mg Oral Q breakfast  . sodium chloride flush  3 mL Intravenous Q12H   sodium  chloride, acetaminophen **OR** acetaminophen, HYDROcodone-acetaminophen, loperamide, morphine injection, ondansetron **OR** ondansetron (ZOFRAN) IV, sodium chloride flush  Assessment/ Plan:  [name] is a 53 y.o. black male with type 2 diabetes mellitus insulin dependent, diabetic neuropathy, hypertension, coronary artery disease status post  CABG, systolic and diastolic congestive heart failure, status post AICD, carotid stenosis, stroke, obstructive sleep apnea, peripheral vascular disease, gout, osteomyelitis of heel. Underwent right BKA by Dr. 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   [place]   #.  ESRD on HD:   - Continue TTS schedule - will need to sit in chair for HD   #. Anemia of chronic kidney disease:  Status post PRBC transfusion on 8/22.  - EPO with HD treatment.  Lab Results  Component Value Date   HGB 6.9 (L) 12/26/2018   Patient is requesting Blood transfusion to be done with HD tomorrow  #. Secondary Hyperparathyroidism:  not currently on binders.  Lab Results  Component Value Date   PTH 66 (H) 07/31/2018   CALCIUM 7.8 (L) 12/24/2018   PHOS 5.2 (H) 12/24/2018     #. Diabetes mellitus type II with chronic kidney disease: history of poor control.  Lab Results  Component Value Date   HGBA1C 7.5 (H) 12/23/2018     #. 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 Dr. Lucky Cowboy.  - As per ID, antibiotics may be discontinued post op 48-72 hours.  - Appreciate vascular, podiatry and infectious disease input.    LOS: 5 Paul [name] 8/24/20208:22 AM

## 2018-12-26 NOTE — Plan of Care (Signed)

## 2018-12-26 NOTE — Progress Notes (Signed)
ID Pt says he fell twice  He has phantom limb/pain No fever   Patient Vitals for the past 24 hrs:  BP Temp Temp src Pulse Resp SpO2  12/26/18 0804 128/88 98.2 F (36.8 C) - 78 17 100 %  12/26/18 0344 112/79 - - 86 18 100 %  12/26/18 0013 112/70 98.4 F (36.9 C) - 82 17 99 %  12/25/18 1957 121/72 97.7 F (36.5 C) Oral 82 18 100 %  12/25/18 1530 99/61 97.9 F (36.6 C) Oral 74 18 98 %  12/25/18 1430 97/72 98.2 F (36.8 C) Oral 77 18 99 %  12/25/18 1330 104/71 97.9 F (36.6 C) Oral 74 18 100 %  12/25/18 1230 137/66 97.7 F (36.5 C) Oral 97 18 100 %    O/E Awake and alert Chest B/l air entry  Rt BKA- surgical stump  12/26/18    Pre BKA     CBC Latest Ref Rng & Units 12/26/2018 12/24/2018 12/23/2018  WBC 4.0 - 10.5 K/uL 13.0(H) 15.6(H) 12.4(H)  Hemoglobin 13.0 - 17.0 g/dL 6.9(L) 6.8(L) 7.7(L)  Hematocrit 39.0 - 52.0 % 23.7(L) 22.7(L) 26.2(L)  Platelets 150 - 400 K/uL 352 399 414(H)   CMP Latest Ref Rng & Units 12/24/2018 12/23/2018 12/22/2018  Glucose 70 - 99 mg/dL 185(H) 133(H) 106(H)  BUN 6 - 20 mg/dL 40(H) 22(H) 30(H)  Creatinine 0.61 - 1.24 mg/dL 7.88(H) 6.03(H) 8.31(H)  Sodium 135 - 145 mmol/L 134(L) 138 140  Potassium 3.5 - 5.1 mmol/L 5.7(H) 4.6 4.6  Chloride 98 - 111 mmol/L 100 102 106  CO2 22 - 32 mmol/L 24 29 24   Calcium 8.9 - 10.3 mg/dL 7.8(L) 8.4(L) 7.9(L)  Total Protein 6.5 - 8.1 g/dL - - -  Total Bilirubin 0.3 - 1.2 mg/dL - - -  Alkaline Phos 38 - 126 U/L - - -  AST 15 - 41 U/L - - -  ALT 0 - 44 U/L - - -   Impression and recommendation  53 year old male with history of diabetes mellitus, end-stage renal disease on dialysis, coronary artery disease status post CABG, AICD, right hip hardware, chronic infection of the right heel, with MRSA Pseudomonas and enterococcus was admitted with worsening wound of the right heel.  Necrotic right heel wound status post BKA which was done on 12/23/2018. Patient has been on IV daptomycin and ceftazidime for the past 4  weeks.  Now that it is 72 hours post BKA and antibiotics can be discontinued. Pt will need rehab as  he is not stable medically to be discharged home safely .  Worsening anemia, interfering with PT  ESRD on dialysis  CAD s/p CABG  AICD  Rt hip hardware  Chronic low dose prednisone  Discussed the management with the care team and also with Development worker, [employer]

## 2018-12-26 NOTE — Progress Notes (Signed)
[facility] Daily Progress Note   Subjective: 3 Days Post-Op: Right below-the-knee amputation  Patient without complaint.  No issues overnight.  Pain is relatively well controlled.  Objective: Vitals:   12/25/18 1957 12/26/18 0013 12/26/18 0344 12/26/18 0804  BP: 121/72 112/70 112/79 128/88  Pulse: 82 82 86 78  Resp: 18 17 18 17   Temp: 97.7 F (36.5 C) 98.4 F (36.9 C)  98.2 F (36.8 C)  TempSrc: Oral     SpO2: 100% 99% 100% 100%  Weight:      Height:       No intake or output data in the 24 hours ending 12/26/18 1034  Physical Exam: A&Ox3, NAD CV: RRR Pulmonary: CTA Bilaterally Abdomen: Soft, Nontender, Nondistended Vascular:  Right Lower Extremity: Our dressing removed.  Sutures/staples are intact clean and dry.  The stump is warm and well perfused.  Stump is healing well.  Knee is flexible at the joint.  Stump redressed.    Document Information Photos    12/26/2018 10:19  Attached To:  Hospital Encounter on 12/21/18  Source Information , Janalyn Harder, PA-C  [facility] (1a)    Laboratory: CBC    Component Value Date/Time   WBC 13.0 (H) 12/26/2018 0529   HGB 6.9 (L) 12/26/2018 0529   HCT 23.7 (L) 12/26/2018 0529   PLT 352 12/26/2018 0529   BMET    Component Value Date/Time   NA 134 (L) 12/24/2018 0701   NA 139 06/23/2016 1553   K 5.7 (H) 12/24/2018 0701   CL 100 12/24/2018 0701   CO2 24 12/24/2018 0701   GLUCOSE 185 (H) 12/24/2018 0701   BUN 40 (H) 12/24/2018 0701   BUN 46 (H) 06/23/2016 1553   CREATININE 7.88 (H) 12/24/2018 0701   CALCIUM 7.8 (L) 12/24/2018 0701   GFRNONAA 7 (L) 12/24/2018 0701   GFRAA 8 (L) 12/24/2018 0701   Assessment/Planning: The patient is a 53 year old male with multiple medical issues including 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   2) Unfortunately, the patient is ineligible to receive any more insurance  approval for rehab.  Strongly do not recommend discharge home.  Dr. Trula Slade from [facility] (covered this weekend) will be looking into an indigent fund the [facility] has that may be able to donate money to cover the cost.  3) Hbg: 6.9 - seems asymptomatic.  CBC in a.m. 4) Continue PT/OT  Discussed with Dr. Ellis Parents [facility] PA-C 12/26/2018 10:34 AM

## 2018-12-26 NOTE — Progress Notes (Signed)
Spoke with Deliliah CM about a possible change to dialysis schedule due to transportation. Spoke with Elta Guadeloupe at [facility], unfortunately the clinic is not able to accommodate a different schedule at this time. However, Elta Guadeloupe did stated that he would check to see another clinic may be able to accommodate a different schedule that would work with transportation. Elta Guadeloupe stated that he will try to arrange a clinic transfer with patient upon discharge. I will also speak with patient to see if he would consider transferring clinic.

## 2018-12-26 NOTE — TOC Progression Note (Signed)
Transition of Care (TOC) - Progression Note    Patient Details  Name: Paul [name]. MRN: [id number] Date of Birth: [birth year]  Transition of Care [facility]) CM/SW Contact  [name], RN Phone Number: 12/26/2018, 12:40 PM  Clinical Narrative:     Reached out to the department assistance director and inquired about the possibility of doing difficult to place and LOG, I reached out to the financial department and left a VM for Rachael to please work with the patient on filling out Medicaid application Awaiting a response from both  Expected Discharge Plan: [place]    Expected Discharge Plan and Services Expected Discharge Plan: [place] Choice: [place] arrangements for the past 2 months: Single Family Home Expected Discharge Date: 12/23/18                                     Social Determinants of Health (SDOH) Interventions    Readmission Risk Interventions Readmission Risk Prevention Plan 11/25/2018 10/06/2018 09/09/2018  Transportation Screening Complete Complete Complete  PCP or Specialist Appt within 3-5 Days Complete Complete Complete  HRI or Home Care Consult Complete Complete Complete  Social Work Consult for [place] Planning/Counseling Complete Complete Not Complete  SW consult not completed comments - - NA  Palliative Care Screening Not Applicable Not Applicable Not Applicable  Medication Review (RN Care Manager) Complete - -  Some recent data might be hidden

## 2018-12-27 LAB — CULTURE, BLOOD (ROUTINE X 2)
Culture: NO GROWTH
Culture: NO GROWTH
Special Requests: ADEQUATE
Special Requests: ADEQUATE

## 2018-12-27 LAB — GLUCOSE, CAPILLARY
Glucose-Capillary: 108 mg/dL — ABNORMAL HIGH (ref 70–99)
Glucose-Capillary: 117 mg/dL — ABNORMAL HIGH (ref 70–99)
Glucose-Capillary: 122 mg/dL — ABNORMAL HIGH (ref 70–99)
Glucose-Capillary: 178 mg/dL — ABNORMAL HIGH (ref 70–99)

## 2018-12-27 LAB — TYPE AND SCREEN
ABO/RH(D): AB POS
Antibody Screen: NEGATIVE
Unit division: 0
Unit division: 0

## 2018-12-27 LAB — AEROBIC/ANAEROBIC CULTURE W GRAM STAIN (SURGICAL/DEEP WOUND)

## 2018-12-27 LAB — BPAM RBC
Blood Product Expiration Date: 202009182359
Blood Product Expiration Date: 202009182359
ISSUE DATE / TIME: 202008221143
Unit Type and Rh: 6200
Unit Type and Rh: 6200

## 2018-12-27 LAB — PREPARE RBC (CROSSMATCH)

## 2018-12-27 LAB — SURGICAL PATHOLOGY

## 2018-12-27 MED ORDER — SODIUM CHLORIDE 0.9% IV SOLUTION: Freq: Once | INTRAVENOUS | Status: DC

## 2018-12-27 NOTE — Progress Notes (Signed)
[facility] NAME: Paul [name]    MR#:  [id number]  DATE OF BIRTH:  1965-07-23  SUBJECTIVE:  CHIEF COMPLAINT:   Chief Complaint  Patient presents with  . Wound Check   Came with foot infection. 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 Pain is well controlled.  Hemodialysis today. Blood transfusion could not be done Afebrile.  REVIEW OF SYSTEMS:  CONSTITUTIONAL: No fever, fatigue or weakness.  EYES: No blurred or double vision.  EARS, NOSE, AND THROAT: No tinnitus or ear pain.  RESPIRATORY: No cough, shortness of breath, wheezing or hemoptysis.  CARDIOVASCULAR: No chest pain, orthopnea, edema.  GASTROINTESTINAL: No nausea, vomiting, diarrhea or abdominal pain.  GENITOURINARY: No dysuria, hematuria.  ENDOCRINE: No polyuria, nocturia,  HEMATOLOGY: No anemia, easy bruising or bleeding SKIN: No rash or lesion. MUSCULOSKELETAL: No joint pain or arthritis.   NEUROLOGIC: No tingling, numbness, weakness.  PSYCHIATRY: No anxiety or depression.     DRUG ALLERGIES:   Allergies  Allergen Reactions  . Other Other (See Comments)    Cardiac Problems. Pt states he tolerates Toradol. Due to kidney and heart problems per pt  . Ibuprofen Other (See Comments)    Heart problems  . Baclofen Other (See Comments)  . Metformin Diarrhea  . Nsaids     Due to kidney and heart problems per pt    VITALS:  Blood pressure 119/71, pulse 92, temperature 97.8 F (36.6 C), temperature source Oral, resp. rate 20, height 5\' 9"  (1.753 m), weight 87.1 kg, SpO2 100 %.  PHYSICAL EXAMINATION:   GENERAL:  53 y.o.-year-old patient lying in the bed with no acute distress.  EYES: Pupils equal, round, reactive to light and accommodation. No scleral icterus. Extraocular muscles intact.  HEENT: Head atraumatic, normocephalic. Oropharynx and nasopharynx clear.  NECK:  Supple, no jugular venous distention. No thyroid enlargement, no tenderness.  LUNGS: Normal breath sounds  bilaterally, no wheezing, rales,rhonchi or crepitation. No use of accessory muscles of respiration.  CARDIOVASCULAR: S1, S2 normal. No murmurs, rubs, or gallops.  ABDOMEN: Soft, nontender, nondistended. Bowel sounds present. No organomegaly or mass.  EXTREMITIES: No pedal edema, cyanosis, or clubbing. right BKA NEUROLOGIC: Cranial nerves II through XII are intact. Muscle strength 5/5 in all extremities. Sensation intact. Gait not checked.  PSYCHIATRIC: The patient is alert and oriented x 3.  SKIN: No obvious rash, lesion, or ulcer.   Physical Exam LABORATORY PANEL:   CBC Recent Labs  Lab 12/26/18 0529  WBC 13.0*  HGB 6.9*  HCT 23.7*  PLT 352   ------------------------------------------------------------------------------------------------------------------  Chemistries  Recent Labs  Lab 12/21/18 1512  12/23/18 0442 12/24/18 0701  NA 137   < > 138 134*  K 4.2   < > 4.6 5.7*  CL 104   < > 102 100  CO2 22   < > 29 24  GLUCOSE 208*   < > 133* 185*  BUN 28*   < > 22* 40*  CREATININE 7.16*   < > 6.03* 7.88*  CALCIUM 7.9*   < > 8.4* 7.8*  MG  --   --  2.3  --   AST 16  --   --   --   ALT 11  --   --   --   ALKPHOS 108  --   --   --   BILITOT 0.4  --   --   --    < > = values in this interval not displayed.   ------------------------------------------------------------------------------------------------------------------  Cardiac Enzymes No results for input(s): TROPONINI in the last 168 hours. ------------------------------------------------------------------------------------------------------------------  RADIOLOGY:  Ct Head Wo Contrast  Result Date: 12/25/2018 CLINICAL DATA:  [place] this morning. Hit the back of the head. EXAM: CT HEAD WITHOUT CONTRAST TECHNIQUE: Contiguous axial images were obtained from the base of the skull through the vertex without intravenous contrast. COMPARISON:  01/06/2017, 03/31/2018 FINDINGS: Brain: No evidence of acute infarction,  hemorrhage, hydrocephalus, extra-axial collection or mass lesion/mass effect. Vascular: There is dense atherosclerotic calcification of the internal carotid arteries. No hyperdense vessels. Skull: Normal. Negative for fracture or focal lesion. Sinuses/Orbits: No acute finding. Other: None. IMPRESSION: No evidence for acute abnormality. Electronically Signed   By: Nolon Nations M.D.   On: 12/25/2018 15:16    ASSESSMENT AND PLAN:   Active Problems:   Right foot infection  1. 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  Podiatry has discussed below-knee amputation. we will continue same antibiotics as he was doing with dialysis. BKA done 12/23/18 Called ID to help decide abx choice. As per ID, may stop ABx now after 2 days of amputation 2. End-stage renal disease on hemodialysis Tuesday Thursday and Saturday.   Nephrology has been consulted. 3. Paroxysmal atrial fibrillation.  On amiodarone and aspirin, Plavix 4. Chronic systolic congestive heart failure. Dialysis to manage fluid 5. Gout bilateral hands allopurinol and oral prednisone 5 mg daily 6. Type 2 diabetes mellitus on diet control only. 7. Chronic loose stools continue same home oral regimen 8. Fall in room    negative CT head.  9. Anemia of ESRD- transfuse 1 unit PRBC  Patient ready to be discharged from the hospital.  Disposition is home with home health versus skilled nursing facility.  All the records are reviewed and case discussed with Care Management/Social Worker Management plans discussed with the patient, family and they are in agreement.  CODE STATUS: Full  TOTAL TIME TAKING CARE OF THIS PATIENT: 35 minutes  POSSIBLE D/C IN 1-2 DAYS, DEPENDING ON CLINICAL CONDITION  Neita Carp M.D on 12/27/2018   Between 7am to 6pm - Pager - [phone]  After 6pm go to [email or web address] - password [email or web address] [facility]  Office  [phone]  CC: Primary care physician; Tracie Harrier, MD  Note:  This dictation was prepared with Dragon dictation along with smaller phrase technology. Any transcriptional errors that result from this process are unintentional.

## 2018-12-27 NOTE — Progress Notes (Signed)
PT Cancellation Note  Patient Details Name: Paul [name]. MRN: [id number] DOB: 17-Feb-1966   Cancelled Treatment:    Reason Eval/Treat Not Completed: Fatigue/lethargy limiting ability to participate;Other (comment). Pt is post dialysis and notes always extremely fatigued afterward. Pt had low hemoglobin yesterday and no additional bloodwork  In chart today to address. Pt states there was some issue regarding bloodwork in lab today. Will hold session due to these reasons today and re attempt tomorrow.    Larae Grooms, PTA 12/27/2018, 3:30 PM

## 2018-12-27 NOTE — TOC Progression Note (Signed)
Transition of Care (TOC) - Progression Note    Patient Details  Name: Paul [name]. MRN: [id number] Date of Birth: Jul 10, 1965  Transition of Care [facility]) CM/SW Contact  [name], RN Phone Number: 12/27/2018, 9:05 AM  Clinical Narrative:    Damaris Schooner to Rachael in the Financial Dept. She assessed the patient on July 28th and will reassess today and attempt to get him qualified for Medicaid if his medical bills will qualify him as his income is over the limit for a single individual with no dependents. I called the Surveyor, quantity Zack to provide the information on assets. He draws $1100 per month disability and has no property and no car.  Inquired about a possible difficult to place and LOG.   Expected Discharge Plan: [place]    Expected Discharge Plan and Services Expected Discharge Plan: [place] Choice: [place] Living arrangements for the past 2 months: Single Family Home Expected Discharge Date: 12/23/18                                     Social Determinants of Health (SDOH) Interventions    Readmission Risk Interventions Readmission Risk Prevention Plan 11/25/2018 10/06/2018 09/09/2018  Transportation Screening Complete Complete Complete  PCP or Specialist Appt within 3-5 Days Complete Complete Complete  HRI or Home Care Consult Complete Complete Complete  Social Work Consult for [place] Planning/Counseling Complete Complete Not Complete  SW consult not completed comments - - NA  Palliative Care Screening Not Applicable Not Applicable Not Applicable  Medication Review (RN Care Manager) Complete - -  Some recent data might be hidden

## 2018-12-27 NOTE — NC FL2 (Signed)
[place] LEVEL OF CARE SCREENING TOOL     IDENTIFICATION  Patient Name: Paul [name]. Birthdate: [birth year] Sex: male Admission Date (Current Location): 12/21/2018  [place] and [place] Number:  Engineering geologist and Address:  [facility], [address] [place], [place] [address]      Provider Number: [id number]  Attending Physician Name and Address:  Hillary Bow, MD  Relative Name and Phone Number:  Loki Wuthrich [phone]    Current Level of Care: Hospital Recommended Level of Care: [place] Prior Approval Number:    Date Approved/Denied:   PASRR Number: [id number]  Discharge Plan: SNF    Current Diagnoses: Patient Active Problem List   Diagnosis Date Noted  . Right foot infection 12/21/2018  . AKI (acute kidney injury) ([place]) 10/04/2018  . Acute osteomyelitis of right ankle or foot ([place]) 09/06/2018  . Pressure ulcer of heel, right, unstageable ([place]) 09/06/2018  . Acute osteomyelitis of right foot ([place]) 07/25/2018  . Osteomyelitis ([place]) 07/25/2018  . Anasarca 05/25/2018  . Pressure injury of skin 05/25/2018  . CKD (chronic kidney disease), stage IV ([place]) 12/15/2016  . Ulcerated, foot ([place]) 12/15/2016  . Gangrene of foot ([place]) 08/21/2016  . HTN (hypertension) 08/21/2016  . Diabetes ([place]) 08/21/2016  . CAD (coronary artery disease) 08/21/2016  . Chronic systolic CHF (congestive heart failure) ([place]) 08/21/2016  . Diabetic foot infection ([place]) 08/21/2016  . Heterotopic ossification of bone 07/12/2014    Orientation RESPIRATION BLADDER Height & Weight     Self, Time, Situation, Place  Normal Continent Weight: (Pt refused post Tx weight) Height:  5\' 9"  (175.3 cm)  BEHAVIORAL SYMPTOMS/MOOD NEUROLOGICAL BOWEL NUTRITION STATUS      Continent Diet(carb modified)  AMBULATORY STATUS COMMUNICATION OF NEEDS Skin   Limited Assist Verbally Normal, Surgical wounds                       Personal  Care Assistance Level of Assistance  Bathing, Dressing Bathing Assistance: Independent Feeding assistance: Independent Dressing Assistance: Independent     Functional Limitations Info  Sight, Hearing, Speech Sight Info: Adequate Hearing Info: Adequate Speech Info: Adequate    SPECIAL CARE FACTORS FREQUENCY  PT (By licensed PT)     PT Frequency: 5 times per week OT Frequency: 3 x week            Contractures Contractures Info: Not present    Additional Factors Info  Allergies Code Status Info: Full Allergies Info: Other, Ibuprofen, Baclofen, Metformin, Nsaids   Insulin Sliding Scale Info: See d/c order Isolation Precautions Info: MRSA foot     Current Medications (12/27/2018):  This is the current hospital active medication list Current Facility-Administered Medications  Medication Dose Route Frequency Provider Last Rate Last Dose  . 0.9 %  sodium chloride infusion (Manually program via Guardrails IV Fluids)   Intravenous Once Kolluru, Sarath, MD      . 0.9 %  sodium chloride infusion (Manually program via Guardrails IV Fluids)   Intravenous Once Vaughan Basta, MD      . 0.9 %  sodium chloride infusion  250 mL Intravenous PRN Algernon Huxley, MD      . acetaminophen (TYLENOL) tablet 650 mg  650 mg Oral Q6H PRN Algernon Huxley, MD       Or  . acetaminophen (TYLENOL) suppository 650 mg  650 mg Rectal Q6H PRN Algernon Huxley, MD      . allopurinol (ZYLOPRIM)  tablet 100 mg  100 mg Oral Daily Algernon Huxley, MD   100 mg at 12/26/18 0916  . amiodarone (PACERONE) tablet 100 mg  100 mg Oral Daily Algernon Huxley, MD   100 mg at 12/26/18 3557  . aspirin chewable tablet 81 mg  81 mg Oral Daily Vaughan Basta, MD   81 mg at 12/26/18 0903  . calcium acetate (PHOSLO) capsule 1,334 mg  1,334 mg Oral TID WC Algernon Huxley, MD   1,334 mg at 12/27/18 0820  . carvedilol (COREG) tablet 3.125 mg  3.125 mg Oral BID Algernon Huxley, MD   3.125 mg at 12/26/18 2157  . Chlorhexidine Gluconate  Cloth 2 % PADS 6 each  6 each Topical Q0600 Algernon Huxley, MD   6 each at 12/27/18 [phone]  . clopidogrel (PLAVIX) tablet 75 mg  75 mg Oral Daily Vaughan Basta, MD   75 mg at 12/26/18 0904  . dicyclomine (BENTYL) capsule 10 mg  10 mg Oral TID AC & HS Algernon Huxley, MD   10 mg at 12/27/18 0820  . ferrous sulfate tablet 325 mg  325 mg Oral BID WC Algernon Huxley, MD   325 mg at 12/27/18 0820  . gabapentin (NEURONTIN) capsule 100 mg  100 mg Oral TID Algernon Huxley, MD   100 mg at 12/27/18 1514  . heparin injection 5,000 Units  5,000 Units Subcutaneous Q8H Dew, Erskine Squibb, MD      . HYDROcodone-acetaminophen (NORCO/VICODIN) 5-325 MG per tablet 1 tablet  1 tablet Oral Q6H PRN Harrie Foreman, MD   1 tablet at 12/27/18 1514  . insulin aspart (novoLOG) injection 0-5 Units  0-5 Units Subcutaneous QHS Lance Coon, MD   2 Units at 12/23/18 2110  . insulin aspart (novoLOG) injection 0-9 Units  0-9 Units Subcutaneous TID WC Lance Coon, MD   1 Units at 12/26/18 1207  . loperamide (IMODIUM) capsule 2 mg  2 mg Oral Q8H PRN Algernon Huxley, MD      . morphine 2 MG/ML injection 1 mg  1 mg Intravenous Q4H PRN Harrie Foreman, MD   1 mg at 12/24/18 0945  . ondansetron (ZOFRAN) tablet 4 mg  4 mg Oral Q6H PRN Algernon Huxley, MD       Or  . ondansetron (ZOFRAN) injection 4 mg  4 mg Intravenous Q6H PRN Algernon Huxley, MD      . predniSONE (DELTASONE) tablet 5 mg  5 mg Oral Q breakfast Algernon Huxley, MD   5 mg at 12/27/18 1514  . sodium chloride flush (NS) 0.9 % injection 3 mL  3 mL Intravenous Q12H Algernon Huxley, MD   3 mL at 12/26/18 2157  . sodium chloride flush (NS) 0.9 % injection 3 mL  3 mL Intravenous PRN Lucky Cowboy Erskine Squibb, MD         Discharge Medications: Please see discharge summary for a list of discharge medications.  Relevant Imaging Results:  Relevant Lab Results:   Additional Information Dialysis patient, LOG for 30 days [id number]  [name], RN

## 2018-12-27 NOTE — Progress Notes (Signed)
Post HD:  Pt refused post Tx weight stating he just wanted to get back to his room, HD Tx tolerated well.     12/27/18 1300  Vital Signs  Temp 98.3 F (36.8 C)  Temp Source Oral  Pulse Rate 85  Pulse Rate Source Dinamap  Resp 12  BP 134/75  BP Location Left Arm  BP Method Automatic  Patient Position (if appropriate) Sitting  Oxygen Therapy  SpO2 100 %  O2 Device Room Air  Pain Assessment  Pain Scale 0-10  Pain Score 0  Dialysis Weight  Weight  (Pt refused post Tx weight)  Type of Weight Post-Dialysis  During Hemodialysis Assessment  Blood Flow Rate (mL/min) 400 mL/min  Arterial Pressure (mmHg) -150 mmHg  Venous Pressure (mmHg) 120 mmHg  Transmembrane Pressure (mmHg) 50 mmHg  Ultrafiltration Rate (mL/min) 720 mL/min  Dialysate Flow Rate (mL/min) 600 ml/min  Conductivity: Machine  14.2  HD Safety Checks Performed Yes  Intra-Hemodialysis Comments Tx completed

## 2018-12-27 NOTE — Progress Notes (Signed)
Pre HD:   12/27/18 0915  Vital Signs  Temp 97.8 F (36.6 C)  Temp Source Oral  Pulse Rate 91  Pulse Rate Source Dinamap  Resp 18  BP 140/84  BP Location Left Arm  BP Method Automatic  Patient Position (if appropriate) Sitting  Oxygen Therapy  SpO2 98 %  O2 Device Room Air  Pain Assessment  Pain Scale 0-10  Pain Score 0  Dialysis Weight  Weight  (unable to weigh Pt)  Type of Weight Pre-Dialysis  Time-Out for Hemodialysis  What Procedure? HD   Pt Identifiers(min of two) First/Last Name;MRN/Account#;Pt's DOB(use if MRN/Acct# not available  Correct Site? Yes  Correct Side? Yes  Correct Procedure? Yes  Consents Verified? Yes  Rad Studies Available? N/A  Safety Precautions Reviewed? Yes  Engineer, civil (consulting) Number 4  Station Number 1  UF/Alarm Test Passed  Conductivity: Meter 13.8  Conductivity: Machine  14.2  pH 7.4  Reverse Osmosis main  Normal Saline Lot Number [id number]  Dialyzer Lot Number [id number]  Disposable Set Lot Number [id number]  Machine Temperature 98.6 F (37 C)  Musician and Audible Yes  Blood Lines Intact and Secured Yes  Pre Treatment Patient Checks  Vascular access used during treatment Catheter  HD catheter dressing before treatment WDL  Patient is receiving dialysis in a chair Yes  Hepatitis B Surface Antigen Results Negative  Date Hepatitis B Surface Antigen Drawn 12/22/18  Hepatitis B Surface Antibody  (<3.1)  Date Hepatitis B Surface Antibody Drawn 11/22/18  Hemodialysis Consent Verified Yes  Hemodialysis Standing Orders Initiated Yes  ECG (Telemetry) Monitor On Yes  Prime Ordered Normal Saline  Length of  DialysisTreatment -hour(s) 3.5 Hour(s)  Dialysis Treatment Comments  (Na 140)  Dialyzer Elisio 17H NR  Dialysate 2K;2.5 Ca  Dialysate Flow Ordered 600  Blood Flow Rate Ordered 400 mL/min  Ultrafiltration Goal 2 Liters  Blood Pressure Parameters  (Per Pt request, MD ordered UF goal change to 2.0)  Blood Products Ordered  Packed Red Blood Cells  Dialysis Blood Pressure Support Ordered Normal Saline  Education / Care Plan  Dialysis Education Provided Yes  Documented Education in Care Plan Yes  Outpatient Plan of Care Reviewed and on Chart Yes

## 2018-12-27 NOTE — Progress Notes (Signed)
HD Initiated:    12/27/18 0927  Vital Signs  Temp 97.8 F (36.6 C)  Temp Source Oral  Pulse Rate 89  Pulse Rate Source Dinamap  Resp 17  BP 126/77  BP Location Left Arm  BP Method Automatic  Patient Position (if appropriate) Sitting  Oxygen Therapy  SpO2 95 %  O2 Device Room Air  Pain Assessment  Pain Scale 0-10  Pain Score 0  Dialysis Weight  Weight  (unable to weigh Pt)  Type of Weight Pre-Dialysis  During Hemodialysis Assessment  Blood Flow Rate (mL/min) 400 mL/min  Arterial Pressure (mmHg) -120 mmHg  Venous Pressure (mmHg) 110 mmHg  Transmembrane Pressure (mmHg) 50 mmHg  Ultrafiltration Rate (mL/min) 570 mL/min  Dialysate Flow Rate (mL/min) 600 ml/min  Conductivity: Machine  14.2  HD Safety Checks Performed Yes  Intra-Hemodialysis Comments Tx initiated  Hemodialysis Catheter Right Subclavian Double lumen Permanent (Tunneled)  Placement Date: (c) 11/24/18   Orientation: Right  Access Location: Subclavian  Hemodialysis Catheter Type: Double lumen Permanent (Tunneled)  Site Condition No complications  Blue Lumen Status Blood return noted;Flushed  Red Lumen Status Blood return noted;Flushed  Purple Lumen Status N/A  Dressing Type Biopatch;Occlusive  Dressing Status Intact  Drainage Description None

## 2018-12-27 NOTE — TOC Progression Note (Signed)
Transition of Care (TOC) - Progression Note    Patient Details  Name: Paul [name]. MRN: [id number] Date of Birth: [birth year]  Transition of Care [facility]) CM/SW Contact  [name], RN Phone Number: 12/27/2018, 3:53 PM  Clinical Narrative:     Edwyna Ready has agreed to for a LOG for up to 30 days, the patient was able to apply for Medicaid with the help of Financial department Rachael, being processed. I called Doug at the [facility] in [place], they are full and do not have a bed I called Atanza at [facility] and they do not have a bed for a dialysis patient Shantel with [facility] stated they do not have a Dialysis bed due to not transporting because of Covid I called Tammy with [facility], I left a detailed voice mail for a call back [place] at [facility]. She agreed to take a look and I will send via the [place]    Expected Discharge Plan: [place]    Expected Discharge Plan and Services Expected Discharge Plan: [place] Choice: [place] arrangements for the past 2 months: Single Family Home Expected Discharge Date: 12/23/18                                     Social Determinants of Health (SDOH) Interventions    Readmission Risk Interventions Readmission Risk Prevention Plan 11/25/2018 10/06/2018 09/09/2018  Transportation Screening Complete Complete Complete  PCP or Specialist Appt within 3-5 Days Complete Complete Complete  HRI or Home Care Consult Complete Complete Complete  Social Work Consult for [place] Planning/Counseling Complete Complete Not Complete  SW consult not completed comments - - NA  Palliative Care Screening Not Applicable Not Applicable Not Applicable  Medication Review (RN Care Manager) Complete - -  Some recent data might be hidden

## 2018-12-27 NOTE — Progress Notes (Signed)
Post HD:     12/27/18 1301  Vital Signs  Temp 98.3 F (36.8 C)  Temp Source Oral  Pulse Rate 85  Pulse Rate Source Dinamap  Resp 12  BP 127/73  BP Location Left Arm  BP Method Automatic  Patient Position (if appropriate) Sitting  Oxygen Therapy  SpO2 100 %  O2 Device Room Air  Pain Assessment  Pain Scale 0-10  Pain Score 0  Dialysis Weight  Weight  (Pt refused post Tx weight)  Type of Weight Post-Dialysis  Post-Hemodialysis Assessment  Rinseback Volume (mL) 250 mL  KECN 80.7 V  Dialyzer Clearance Lightly streaked  Duration of HD Treatment -hour(s) 3.5 hour(s)  Hemodialysis Intake (mL) 500 mL  UF Total -Machine (mL) 2500 mL  Net UF (mL) 2000 mL  Tolerated HD Treatment Yes  Post-Hemodialysis Comments  (n/a)  AVG/AVF Arterial Site Held (minutes)  (n/a)  AVG/AVF Venous Site Held (minutes)  (n/a)  Education / Care Plan  Dialysis Education Provided Yes  Documented Education in Care Plan Yes  Outpatient Plan of Care Reviewed and on Chart Yes  Hemodialysis Catheter Right Subclavian Double lumen Permanent (Tunneled)  Placement Date: (c) 11/24/18   Orientation: Right  Access Location: Subclavian  Hemodialysis Catheter Type: Double lumen Permanent (Tunneled)  Site Condition No complications  Blue Lumen Status Heparin locked  Red Lumen Status Heparin locked  Purple Lumen Status N/A  Catheter fill solution Heparin 1000 units/ml  Catheter fill volume (Arterial) 1.5 cc  Catheter fill volume (Venous) 1.5  Dressing Type Biopatch;Occlusive  Dressing Status Intact  Drainage Description None  Post treatment catheter status Capped and Clamped

## 2018-12-27 NOTE — Progress Notes (Signed)
[facility]  ROUNDING NOTE   Subjective:   Doing fair Seen during HD   HEMODIALYSIS FLOWSHEET:  Blood Flow Rate (mL/min): 400 mL/min Arterial Pressure (mmHg): -150 mmHg Venous Pressure (mmHg): 120 mmHg Transmembrane Pressure (mmHg): 50 mmHg Ultrafiltration Rate (mL/min): 720 mL/min Dialysate Flow Rate (mL/min): 600 ml/min Conductivity: Machine : 14.2 Conductivity: Machine : 14.2 Dialysis Fluid Bolus: Normal Saline Bolus Amount (mL): 250 mL    Objective:  Vital signs in last 24 hours:  Temp:  [97.7 F (36.5 C)-98.2 F (36.8 C)] 97.8 F (36.6 C) (08/25 0927) Pulse Rate:  [79-92] 85 (08/25 1301) Resp:  [10-20] 12 (08/25 1301) BP: (106-145)/(65-98) 134/75 (08/25 1301) SpO2:  [95 %-100 %] 100 % (08/25 1301)  Weight change:  Filed Weights   12/24/18 0434 12/24/18 1452  Weight: 73.4 kg 87.1 kg    Intake/Output: I/O last 3 completed shifts: In: 400 [P.O.:400] Out: 1 [Stool:1]   Intake/Output this shift:  Total I/O In: -  Out: 2 [Urine:1; Stool:1]  Physical Exam: General: No acute distress, laying in bed  Head: Normocephalic, atraumatic. Moist oral mucosal membranes  Eyes: Anicteric  Neck: Supple   Lungs:  Clear to auscultation, normal effort  Heart: regular  Abdomen:  Soft, nontender, bowel sounds present  Extremities: Left foot trace edema, right BKA with clean and dry dressings.   Neurologic: Awake, alert, following commands  Skin: No lesions  Access: IJ PermCath.    Basic Metabolic Panel: Recent Labs  Lab 12/21/18 1512 12/22/18 0413 12/23/18 0442 12/24/18 0701  NA 137 140 138 134*  K 4.2 4.6 4.6 5.7*  CL 104 106 102 100  CO2 22 24 29 24   GLUCOSE 208* 106* 133* 185*  BUN 28* 30* 22* 40*  CREATININE 7.16* 8.31* 6.03* 7.88*  CALCIUM 7.9* 7.9* 8.4* 7.8*  MG  --   --  2.3  --   PHOS  --   --   --  5.2*    Liver Function Tests: Recent Labs  Lab 12/21/18 1512 12/24/18 0701  AST 16  --   ALT 11  --   ALKPHOS 108  --   BILITOT  0.4  --   PROT 7.1  --   ALBUMIN 2.9* 2.4*   No results for input(s): LIPASE, AMYLASE in the last 168 hours. No results for input(s): AMMONIA in the last 168 hours.  CBC: Recent Labs  Lab 12/21/18 1512 12/22/18 0413 12/23/18 0442 12/23/18 1712 12/24/18 0701 12/26/18 0529  WBC 10.9* 9.3 9.9 12.4* 15.6* 13.0*  NEUTROABS 9.0*  --   --   --   --   --   HGB 7.7* 7.1* 8.2* 7.7* 6.8* 6.9*  HCT 27.1* 24.5* 28.1* 26.2* 22.7* 23.7*  MCV 94.8 93.5 92.4 92.6 91.5 93.7  PLT 382 393 432* 414* 399 352    Cardiac Enzymes: Recent Labs  Lab 12/22/18 1200 12/23/18 0442  CKTOTAL 34* 29*    BNP: Invalid input(s): POCBNP  CBG: Recent Labs  Lab 12/26/18 0802 12/26/18 1204 12/26/18 1651 12/26/18 2142 12/27/18 0754  GLUCAP 98 130* 177* 131* 108*    Microbiology: Results for orders placed or performed during the hospital encounter of 12/21/18  SARS Coronavirus 2 [facility] order, Performed in [facility] hospital lab) Nasopharyngeal Nasopharyngeal Swab     Status: None   Collection Time: 12/21/18  5:14 PM   Specimen: Nasopharyngeal Swab  Result Value Ref Range Status   SARS Coronavirus 2 NEGATIVE NEGATIVE Final    Comment: (NOTE) If  result is NEGATIVE SARS-CoV-2 target nucleic acids are NOT DETECTED. The SARS-CoV-2 RNA is generally detectable in upper and lower  respiratory specimens during the acute phase of infection. The lowest  concentration of SARS-CoV-2 viral copies this assay can detect is 250  copies / mL. A negative result does not preclude SARS-CoV-2 infection  and should not be used as the sole basis for treatment or other  patient management decisions.  A negative result may occur with  improper specimen collection / handling, submission of specimen other  than nasopharyngeal swab, presence of viral mutation(s) within the  areas targeted by this assay, and inadequate number of viral copies  (<250 copies / mL). A negative result must be combined with clinical   observations, patient history, and epidemiological information. If result is POSITIVE SARS-CoV-2 target nucleic acids are DETECTED. The SARS-CoV-2 RNA is generally detectable in upper and lower  respiratory specimens dur ing the acute phase of infection.  Positive  results are indicative of active infection with SARS-CoV-2.  Clinical  correlation with patient history and other diagnostic information is  necessary to determine patient infection status.  Positive results do  not rule out bacterial infection or co-infection with other viruses. If result is PRESUMPTIVE POSTIVE SARS-CoV-2 nucleic acids MAY BE PRESENT.   A presumptive positive result was obtained on the submitted specimen  and confirmed on repeat testing.  While 2019 novel coronavirus  (SARS-CoV-2) nucleic acids may be present in the submitted sample  additional confirmatory testing may be necessary for epidemiological  and / or clinical management purposes  to differentiate between  SARS-CoV-2 and other Sarbecovirus currently known to infect humans.  If clinically indicated additional testing with an alternate test  methodology [phone]) is advised. The SARS-CoV-2 RNA is generally  detectable in upper and lower respiratory sp ecimens during the acute  phase of infection. The expected result is Negative. Fact Sheet for Patients:  StrictlyIdeas.no Fact Sheet for Healthcare Providers: BankingDealers.co.za This test is not yet approved or cleared by the [place] FDA and has been authorized for detection and/or diagnosis of SARS-CoV-2 by FDA under an Emergency Use Authorization (EUA).  This EUA will remain in effect (meaning this test can be used) for the duration of the COVID-19 declaration under Section 564(b)(1) of the Act, 21 U.S.C. section 360bbb-3(b)(1), unless the authorization is terminated or revoked sooner. Performed at [facility], [address] [place], [place] [address]   Aerobic/Anaerobic Culture (surgical/deep wound)     Status: None (Preliminary result)   Collection Time: 12/22/18  8:00 AM   Specimen: Wound  Result Value Ref Range Status   Specimen Description   Final    WOUND Performed at [facility], [address] [place], [place] [address]    Special Requests   Final    RIGHT FOOT Performed at [facility], [address] [place], [place] [address]    Gram Stain   Final    RARE WBC PRESENT, PREDOMINANTLY PMN FEW GRAM VARIABLE ROD Performed at [facility], [place] [address] [place], [place] [address]    Culture   Final    ABUNDANT MULTIPLE ORGANISMS PRESENT, NONE PREDOMINANT   Report Status PENDING  Incomplete  CULTURE, BLOOD (ROUTINE X 2) w Reflex to ID Panel     Status: None   Collection Time: 12/22/18  9:20 PM   Specimen: BLOOD  Result Value Ref Range Status   Specimen Description BLOOD LEFT ASSIST CONTROL  Final  Special Requests   Final    BOTTLES DRAWN AEROBIC AND ANAEROBIC Blood Culture adequate volume   Culture   Final    NO GROWTH 5 DAYS Performed at [facility], [address] [place], [place] [address]    Report Status 12/27/2018 FINAL  Final  CULTURE, BLOOD (ROUTINE X 2) w Reflex to ID Panel     Status: None   Collection Time: 12/22/18  9:20 PM   Specimen: BLOOD  Result Value Ref Range Status   Specimen Description BLOOD RIGHT ARM  Final   Special Requests   Final    BOTTLES DRAWN AEROBIC AND ANAEROBIC Blood Culture adequate volume   Culture   Final    NO GROWTH 5 DAYS Performed at [facility], [address] [place], [place] [address]    Report Status 12/27/2018 FINAL  Final  MRSA PCR Screening     Status: None   Collection Time: 12/23/18 11:01 AM   Specimen: Nasopharyngeal  Result Value Ref Range Status   MRSA by PCR NEGATIVE NEGATIVE Final    Comment:        The GeneXpert MRSA Assay (FDA approved for NASAL specimens only), is one  component of a comprehensive MRSA colonization surveillance program. It is not intended to diagnose MRSA infection nor to guide or monitor treatment for MRSA infections. Performed at [facility], [address] [place], [place] [address]     Coagulation Studies: No results for input(s): LABPROT, INR in the last 72 hours.  Urinalysis: No results for input(s): COLORURINE, LABSPEC, PHURINE, GLUCOSEU, HGBUR, BILIRUBINUR, KETONESUR, PROTEINUR, UROBILINOGEN, NITRITE, LEUKOCYTESUR in the last 72 hours.  Invalid input(s): APPERANCEUR    Imaging: Ct Head Wo Contrast  Result Date: 12/25/2018 CLINICAL DATA:  [place] this morning. Hit the back of the head. EXAM: CT HEAD WITHOUT CONTRAST TECHNIQUE: Contiguous axial images were obtained from the base of the skull through the vertex without intravenous contrast. COMPARISON:  01/06/2017, 03/31/2018 FINDINGS: Brain: No evidence of acute infarction, hemorrhage, hydrocephalus, extra-axial collection or mass lesion/mass effect. Vascular: There is dense atherosclerotic calcification of the internal carotid arteries. No hyperdense vessels. Skull: Normal. Negative for fracture or focal lesion. Sinuses/Orbits: No acute finding. Other: None. IMPRESSION: No evidence for acute abnormality. Electronically Signed   By: Nolon Nations M.D.   On: 12/25/2018 15:16     Medications:   . sodium chloride     . sodium chloride   Intravenous Once  . sodium chloride   Intravenous Once  . allopurinol  100 mg Oral Daily  . amiodarone  100 mg Oral Daily  . aspirin  81 mg Oral Daily  . calcium acetate  1,334 mg Oral TID WC  . carvedilol  3.125 mg Oral BID  . Chlorhexidine Gluconate Cloth  6 each Topical Q0600  . clopidogrel  75 mg Oral Daily  . dicyclomine  10 mg Oral TID AC & HS  . ferrous sulfate  325 mg Oral BID WC  . gabapentin  100 mg Oral TID  . heparin  5,000 Units Subcutaneous Q8H  . insulin aspart  0-5 Units Subcutaneous QHS  . insulin aspart   0-9 Units Subcutaneous TID WC  . predniSONE  5 mg Oral Q breakfast  . sodium chloride flush  3 mL Intravenous Q12H   sodium chloride, acetaminophen **OR** acetaminophen, HYDROcodone-acetaminophen, loperamide, morphine injection, ondansetron **OR** ondansetron (ZOFRAN) IV, sodium chloride flush  Assessment/ Plan:  Mr. Tradarius Reinwald. is a 53 y.o. black male  with type 2 diabetes mellitus insulin dependent, diabetic neuropathy, hypertension, coronary artery disease status post CABG, systolic and diastolic congestive heart failure, status post AICD, carotid stenosis, stroke, obstructive sleep apnea, peripheral vascular disease, gout, osteomyelitis of heel. Underwent right BKA by Dr. 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   [place]   #.  ESRD on HD:   - Continue TTS schedule - sitting in chair for HD  #. Anemia of chronic kidney disease:  Status post PRBC transfusion on 8/22.  - EPO with HD treatment.  Lab Results  Component Value Date   HGB 6.9 (L) 12/26/2018    blood transfusion planned  #. Secondary Hyperparathyroidism:  not currently on binders.  Lab Results  Component Value Date   PTH 66 (H) 07/31/2018   CALCIUM 7.8 (L) 12/24/2018   PHOS 5.2 (H) 12/24/2018     #. Diabetes mellitus type II with chronic kidney disease: history of poor control.  Lab Results  Component Value Date   HGBA1C 7.5 (H) 12/23/2018     #. 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 Dr. Lucky Cowboy.  - As per ID, antibiotics may be discontinued post op 48-72 hours.  - Appreciate vascular, podiatry and infectious disease input.    LOS: 6 Shaquavia Whisonant 8/25/20201:02 PM

## 2018-12-27 NOTE — Progress Notes (Signed)
Post HD Assessment:     12/27/18 1301  Neurological  Level of Consciousness Alert  Orientation Level Oriented X4  Respiratory  Respiratory Pattern Regular;Unlabored  Chest Assessment Chest expansion symmetrical  Bilateral Breath Sounds Clear  Cardiac  Pulse Regular  Heart Sounds S1, S2  Jugular Venous Distention (JVD) No  ECG Monitor Yes  Cardiac Rhythm Other (Comment) (PVC's)  Vascular  R Radial Pulse +2  L Radial Pulse +2  Musculoskeletal  Musculoskeletal (WDL) X  Generalized Weakness  (Pt able to transfer independently with rolling walker)  Assistive Device Front wheel walker  Psychosocial  Psychosocial (WDL) WDL  Wound / Incision (Open or Dehisced) 11/21/18 Sacrum stage I  Date First Assessed/Time First Assessed: 11/21/18 1730   Location: Sacrum  Wound Description (Comments): stage I  Present on Admission: No  Dressing Type  (Preventative foam clean, dry, intact)

## 2018-12-27 NOTE — Progress Notes (Signed)
Pre HD Assessment:     12/27/18 0915  Neurological  Level of Consciousness Alert  Orientation Level Oriented X4  Respiratory  Respiratory Pattern Regular;Unlabored  Chest Assessment Chest expansion symmetrical  Bilateral Breath Sounds Clear  Cardiac  Pulse Regular  Heart Sounds S1, S2  Jugular Venous Distention (JVD) No  ECG Monitor Yes  Cardiac Rhythm Other (Comment) (PVC's )  Vascular  R Radial Pulse +2  L Radial Pulse +2  Musculoskeletal  Musculoskeletal (WDL) X  Generalized Weakness  (Pt able to transfer independently with rolling walker)  Assistive Device Front wheel walker  Psychosocial  Psychosocial (WDL) WDL  Wound / Incision (Open or Dehisced) 11/21/18 Sacrum stage I  Date First Assessed/Time First Assessed: 11/21/18 1730   Location: Sacrum  Wound Description (Comments): stage I  Present on Admission: No  Dressing Type  (Preventative foam clean, dry, intact)

## 2018-12-28 LAB — CBC WITH DIFFERENTIAL/PLATELET
Abs Immature Granulocytes: 0.3 10*3/uL — ABNORMAL HIGH (ref 0.00–0.07)
Basophils Absolute: 0 10*3/uL (ref 0.0–0.1)
Basophils Relative: 0 %
Eosinophils Absolute: 0 10*3/uL (ref 0.0–0.5)
Eosinophils Relative: 0 %
HCT: 32.1 % — ABNORMAL LOW (ref 39.0–52.0)
Hemoglobin: 9.7 g/dL — ABNORMAL LOW (ref 13.0–17.0)
Immature Granulocytes: 2 %
Lymphocytes Relative: 9 %
Lymphs Abs: 1.2 10*3/uL (ref 0.7–4.0)
MCH: 28 pg (ref 26.0–34.0)
MCHC: 30.2 g/dL (ref 30.0–36.0)
MCV: 92.8 fL (ref 80.0–100.0)
Monocytes Absolute: 0.7 10*3/uL (ref 0.1–1.0)
Monocytes Relative: 5 %
Neutro Abs: 11.1 10*3/uL — ABNORMAL HIGH (ref 1.7–7.7)
Neutrophils Relative %: 84 %
Platelets: 384 10*3/uL (ref 150–400)
RBC: 3.46 MIL/uL — ABNORMAL LOW (ref 4.22–5.81)
RDW: 18.4 % — ABNORMAL HIGH (ref 11.5–15.5)
WBC: 13.3 10*3/uL — ABNORMAL HIGH (ref 4.0–10.5)
nRBC: 0 % (ref 0.0–0.2)

## 2018-12-28 LAB — GLUCOSE, CAPILLARY
Glucose-Capillary: 182 mg/dL — ABNORMAL HIGH (ref 70–99)
Glucose-Capillary: 158 mg/dL — ABNORMAL HIGH (ref 70–99)
Glucose-Capillary: 161 mg/dL — ABNORMAL HIGH (ref 70–99)
Glucose-Capillary: 188 mg/dL — ABNORMAL HIGH (ref 70–99)

## 2018-12-28 LAB — TYPE AND SCREEN
ABO/RH(D): AB POS
Antibody Screen: NEGATIVE
Unit division: 0

## 2018-12-28 LAB — POTASSIUM: Potassium: 4.7 mmol/L (ref 3.5–5.1)

## 2018-12-28 LAB — BPAM RBC
Blood Product Expiration Date: 202009182359
ISSUE DATE / TIME: 202008252221
Unit Type and Rh: 6200

## 2018-12-28 NOTE — Progress Notes (Signed)
Blood Infusion complete. Pt tolerated well. VS WNL. No temp noted. Pt has no complaints. Pdowless, rn

## 2018-12-28 NOTE — Progress Notes (Signed)
[facility]  ROUNDING NOTE   Subjective:   Doing fair Ate 100% of breakfast No leg edema or SOB  Objective:  Vital signs in last 24 hours:  Temp:  [97.7 F (36.5 C)-98.6 F (37 C)] 98.6 F (37 C) (08/26 0824) Pulse Rate:  [80-92] 88 (08/26 0824) Resp:  [10-21] 18 (08/26 0824) BP: (103-146)/(63-98) 146/84 (08/26 0824) SpO2:  [95 %-100 %] 95 % (08/26 0824)  Weight change:  Filed Weights    Intake/Output: I/O last 3 completed shifts: In: 93 [Blood:880] Out: 2053 [Urine:51; Other:2000; Stool:2]   Intake/Output this shift:  No intake/output data recorded.  Physical Exam: General: No acute distress, laying in bed  Head: Normocephalic, atraumatic. Moist oral mucosal membranes  Eyes: Anicteric  Neck: Supple   Lungs:  Clear to auscultation, normal effort  Heart: regular  Abdomen:  Soft, nontender, bowel sounds present  Extremities: Left  trace edema, right BKA with dressings.   Neurologic: Awake, alert, following commands  Skin: No lesions  Access: IJ PermCath.    Basic Metabolic Panel: Recent Labs  Lab 12/21/18 1512 12/22/18 0413 12/23/18 0442 12/24/18 0701 12/28/18 0746  NA 137 140 138 134*  --   K 4.2 4.6 4.6 5.7* 4.7  CL 104 106 102 100  --   CO2 22 24 29 24   --   GLUCOSE 208* 106* 133* 185*  --   BUN 28* 30* 22* 40*  --   CREATININE 7.16* 8.31* 6.03* 7.88*  --   CALCIUM 7.9* 7.9* 8.4* 7.8*  --   MG  --   --  2.3  --   --   PHOS  --   --   --  5.2*  --     Liver Function Tests: Recent Labs  Lab 12/21/18 1512 12/24/18 0701  AST 16  --   ALT 11  --   ALKPHOS 108  --   BILITOT 0.4  --   PROT 7.1  --   ALBUMIN 2.9* 2.4*   No results for input(s): LIPASE, AMYLASE in the last 168 hours. No results for input(s): AMMONIA in the last 168 hours.  CBC: Recent Labs  Lab 12/21/18 1512  12/23/18 0442 12/23/18 1712 12/24/18 0701 12/26/18 0529 12/28/18 0746  WBC 10.9*   < > 9.9 12.4* 15.6* 13.0* 13.3*  NEUTROABS 9.0*  --   --   --    --   --  11.1*  HGB 7.7*   < > 8.2* 7.7* 6.8* 6.9* 9.7*  HCT 27.1*   < > 28.1* 26.2* 22.7* 23.7* 32.1*  MCV 94.8   < > 92.4 92.6 91.5 93.7 92.8  PLT 382   < > 432* 414* 399 352 384   < > = values in this interval not displayed.    Cardiac Enzymes: Recent Labs  Lab 12/22/18 1200 12/23/18 0442  CKTOTAL 34* 29*    BNP: Invalid input(s): POCBNP  CBG: Recent Labs  Lab 12/27/18 0754 12/27/18 1358 12/27/18 1654 12/27/18 2104 12/28/18 0826  GLUCAP 108* 117* 122* 178* 182*    Microbiology: Results for orders placed or performed during the hospital encounter of 12/21/18  SARS Coronavirus 2 [facility] order, Performed in [facility] hospital lab) Nasopharyngeal Nasopharyngeal Swab     Status: None   Collection Time: 12/21/18  5:14 PM   Specimen: Nasopharyngeal Swab  Result Value Ref Range Status   SARS Coronavirus 2 NEGATIVE NEGATIVE Final    Comment: (NOTE) If result is NEGATIVE SARS-CoV-2 target nucleic  acids are NOT DETECTED. The SARS-CoV-2 RNA is generally detectable in upper and lower  respiratory specimens during the acute phase of infection. The lowest  concentration of SARS-CoV-2 viral copies this assay can detect is 250  copies / mL. A negative result does not preclude SARS-CoV-2 infection  and should not be used as the sole basis for treatment or other  patient management decisions.  A negative result may occur with  improper specimen collection / handling, submission of specimen other  than nasopharyngeal swab, presence of viral mutation(s) within the  areas targeted by this assay, and inadequate number of viral copies  (<250 copies / mL). A negative result must be combined with clinical  observations, patient history, and epidemiological information. If result is POSITIVE SARS-CoV-2 target nucleic acids are DETECTED. The SARS-CoV-2 RNA is generally detectable in upper and lower  respiratory specimens dur ing the acute phase of infection.  Positive  results are  indicative of active infection with SARS-CoV-2.  Clinical  correlation with patient history and other diagnostic information is  necessary to determine patient infection status.  Positive results do  not rule out bacterial infection or co-infection with other viruses. If result is PRESUMPTIVE POSTIVE SARS-CoV-2 nucleic acids MAY BE PRESENT.   A presumptive positive result was obtained on the submitted specimen  and confirmed on repeat testing.  While 2019 novel coronavirus  (SARS-CoV-2) nucleic acids may be present in the submitted sample  additional confirmatory testing may be necessary for epidemiological  and / or clinical management purposes  to differentiate between  SARS-CoV-2 and other Sarbecovirus currently known to infect humans.  If clinically indicated additional testing with an alternate test  methodology [phone]) is advised. The SARS-CoV-2 RNA is generally  detectable in upper and lower respiratory sp ecimens during the acute  phase of infection. The expected result is Negative. Fact Sheet for Patients:  StrictlyIdeas.no Fact Sheet for Healthcare Providers: BankingDealers.co.za This test is not yet approved or cleared by the [place] FDA and has been authorized for detection and/or diagnosis of SARS-CoV-2 by FDA under an Emergency Use Authorization (EUA).  This EUA will remain in effect (meaning this test can be used) for the duration of the COVID-19 declaration under Section 564(b)(1) of the Act, 21 U.S.C. section 360bbb-3(b)(1), unless the authorization is terminated or revoked sooner. Performed at [facility], [address] [place], [place] [address]   Aerobic/Anaerobic Culture (surgical/deep wound)     Status: None   Collection Time: 12/22/18  8:00 AM   Specimen: Wound  Result Value Ref Range Status   Specimen Description   Final    WOUND Performed at [facility], [address]  [place], [place] [address]    Special Requests   Final    RIGHT FOOT Performed at [facility], [address] [place], [place] [address]    Gram Stain   Final    RARE WBC PRESENT, PREDOMINANTLY PMN FEW GRAM VARIABLE ROD    Culture   Final    ABUNDANT MULTIPLE ORGANISMS PRESENT, NONE PREDOMINANT NO ANAEROBES ISOLATED Performed at [facility], [place] [address], [place], [place] [address]    Report Status 12/27/2018 FINAL  Final  CULTURE, BLOOD (ROUTINE X 2) w Reflex to ID Panel     Status: None   Collection Time: 12/22/18  9:20 PM   Specimen: BLOOD  Result Value Ref Range Status   Specimen Description BLOOD LEFT ASSIST CONTROL  Final   Special Requests  Final    BOTTLES DRAWN AEROBIC AND ANAEROBIC Blood Culture adequate volume   Culture   Final    NO GROWTH 5 DAYS Performed at [facility], [address] [place], [place] [address]    Report Status 12/27/2018 FINAL  Final  CULTURE, BLOOD (ROUTINE X 2) w Reflex to ID Panel     Status: None   Collection Time: 12/22/18  9:20 PM   Specimen: BLOOD  Result Value Ref Range Status   Specimen Description BLOOD RIGHT ARM  Final   Special Requests   Final    BOTTLES DRAWN AEROBIC AND ANAEROBIC Blood Culture adequate volume   Culture   Final    NO GROWTH 5 DAYS Performed at [facility], [address] [place], [place] [address]    Report Status 12/27/2018 FINAL  Final  MRSA PCR Screening     Status: None   Collection Time: 12/23/18 11:01 AM   Specimen: Nasopharyngeal  Result Value Ref Range Status   MRSA by PCR NEGATIVE NEGATIVE Final    Comment:        The GeneXpert MRSA Assay (FDA approved for NASAL specimens only), is one component of a comprehensive MRSA colonization surveillance program. It is not intended to diagnose MRSA infection nor to guide or monitor treatment for MRSA infections. Performed at [facility], [address] [place], [place] [address]     Coagulation  Studies: No results for input(s): LABPROT, INR in the last 72 hours.  Urinalysis: No results for input(s): COLORURINE, LABSPEC, PHURINE, GLUCOSEU, HGBUR, BILIRUBINUR, KETONESUR, PROTEINUR, UROBILINOGEN, NITRITE, LEUKOCYTESUR in the last 72 hours.  Invalid input(s): APPERANCEUR    Imaging: No results found.   Medications:   . sodium chloride     . sodium chloride   Intravenous Once  . allopurinol  100 mg Oral Daily  . amiodarone  100 mg Oral Daily  . aspirin  81 mg Oral Daily  . calcium acetate  1,334 mg Oral TID WC  . carvedilol  3.125 mg Oral BID  . Chlorhexidine Gluconate Cloth  6 each Topical Q0600  . clopidogrel  75 mg Oral Daily  . dicyclomine  10 mg Oral TID AC & HS  . ferrous sulfate  325 mg Oral BID WC  . gabapentin  100 mg Oral TID  . heparin  5,000 Units Subcutaneous Q8H  . insulin aspart  0-5 Units Subcutaneous QHS  . insulin aspart  0-9 Units Subcutaneous TID WC  . predniSONE  5 mg Oral Q breakfast  . sodium chloride flush  3 mL Intravenous Q12H   sodium chloride, acetaminophen **OR** acetaminophen, HYDROcodone-acetaminophen, loperamide, morphine injection, ondansetron **OR** ondansetron (ZOFRAN) IV, sodium chloride flush  Assessment/ Plan:  [name] is a 53 y.o. black male with type 2 diabetes mellitus insulin dependent, diabetic neuropathy, hypertension, coronary artery disease status post CABG, systolic and diastolic congestive heart failure, status post AICD, carotid stenosis, stroke, obstructive sleep apnea, peripheral vascular disease, gout, osteomyelitis of heel. Underwent right BKA by Dr. 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   [place]   #.  ESRD on HD:   - Continue TTS schedule - sit in chair during dialysis  #. Anemia of chronic kidney disease:  Status post PRBC transfusion on 8/22.  - EPO with HD treatment.  Lab Results  Component Value Date   HGB 9.7 (L) 12/28/2018    blood transfusion planned  #. Secondary  Hyperparathyroidism:  not currently on  binders.  Lab Results  Component Value Date   PTH 66 (H) 07/31/2018   CALCIUM 7.8 (L) 12/24/2018   PHOS 5.2 (H) 12/24/2018     #. Diabetes mellitus type II with chronic kidney disease: history of poor control.  Lab Results  Component Value Date   HGBA1C 7.5 (H) 12/23/2018  currently sliding scale   #. 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 Dr. Lucky Cowboy.   - off Abx - Appreciate vascular, podiatry and infectious disease input.    LOS: 7 Paul [name] 8/26/202011:28 AM

## 2018-12-28 NOTE — TOC Progression Note (Signed)
Transition of Care (TOC) - Progression Note    Patient Details  Name: Paul [name]. MRN: [id number] Date of Birth: 09-13-1965  Transition of Care [facility]) CM/SW Contact  [name], RN Phone Number: 12/28/2018, 12:20 PM  Clinical Narrative:     Spoke to [facility] in [place] for a possible Bed for LOG, she will look at it and call back.  Expected Discharge Plan: [place]    Expected Discharge Plan and Services Expected Discharge Plan: [place] Choice: [place] Living arrangements for the past 2 months: Single Family Home Expected Discharge Date: 12/23/18                                     Social Determinants of Health (SDOH) Interventions    Readmission Risk Interventions Readmission Risk Prevention Plan 11/25/2018 10/06/2018 09/09/2018  Transportation Screening Complete Complete Complete  PCP or Specialist Appt within 3-5 Days Complete Complete Complete  HRI or Home Care Consult Complete Complete Complete  Social Work Consult for [place] Planning/Counseling Complete Complete Not Complete  SW consult not completed comments - - NA  Palliative Care Screening Not Applicable Not Applicable Not Applicable  Medication Review (RN Care Manager) Complete - -  Some recent data might be hidden

## 2018-12-28 NOTE — Progress Notes (Signed)
[place] at [place] NAME: Paul [name]    MR#:  [id number]  DATE OF BIRTH:  15-Sep-1965  SUBJECTIVE:  CHIEF COMPLAINT:   Chief Complaint  Patient presents with  . Wound Check   Came with foot infection. 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 Pain is well controlled.  Blood transfusion overnight  REVIEW OF SYSTEMS:  CONSTITUTIONAL: No fever, fatigue or weakness.  EYES: No blurred or double vision.  EARS, NOSE, AND THROAT: No tinnitus or ear pain.  RESPIRATORY: No cough, shortness of breath, wheezing or hemoptysis.  CARDIOVASCULAR: No chest pain, orthopnea, edema.  GASTROINTESTINAL: No nausea, vomiting, diarrhea or abdominal pain.  GENITOURINARY: No dysuria, hematuria.  ENDOCRINE: No polyuria, nocturia,  HEMATOLOGY: No anemia, easy bruising or bleeding SKIN: No rash or lesion. MUSCULOSKELETAL: No joint pain or arthritis.   NEUROLOGIC: No tingling, numbness, weakness.  PSYCHIATRY: No anxiety or depression.  DRUG ALLERGIES:   Allergies  Allergen Reactions  . Other Other (See Comments)    Cardiac Problems. Pt states he tolerates Toradol. Due to kidney and heart problems per pt  . Ibuprofen Other (See Comments)    Heart problems  . Baclofen Other (See Comments)  . Metformin Diarrhea  . Nsaids     Due to kidney and heart problems per pt    VITALS:  Blood pressure (!) 146/84, pulse 88, temperature 98.6 F (37 C), temperature source Oral, resp. rate 18, height 5\' 9"  (1.753 m), weight 87.1 kg, SpO2 95 %.  PHYSICAL EXAMINATION:   GENERAL:  53 y.o.-year-old patient lying in the bed with no acute distress.  EYES: Pupils equal, round, reactive to light and accommodation. No scleral icterus. Extraocular muscles intact.  HEENT: Head atraumatic, normocephalic. Oropharynx and nasopharynx clear.  NECK:  Supple, no jugular venous distention. No thyroid enlargement, no tenderness.  LUNGS: Normal breath sounds bilaterally, no wheezing, rales,rhonchi or  crepitation. No use of accessory muscles of respiration.  CARDIOVASCULAR: S1, S2 normal. No murmurs, rubs, or gallops.  ABDOMEN: Soft, nontender, nondistended. Bowel sounds present. No organomegaly or mass.  EXTREMITIES: No pedal edema, cyanosis, or clubbing. right BKA NEUROLOGIC: Cranial nerves II through XII are intact. Muscle strength 5/5 in all extremities. Sensation intact. Gait not checked.  PSYCHIATRIC: The patient is alert and oriented x 3.  SKIN: No obvious rash, lesion, or ulcer.   Physical Exam LABORATORY PANEL:   CBC Recent Labs  Lab 12/28/18 0746  WBC 13.3*  HGB 9.7*  HCT 32.1*  PLT 384   ------------------------------------------------------------------------------------------------------------------  Chemistries  Recent Labs  Lab 12/21/18 1512  12/23/18 0442 12/24/18 0701 12/28/18 0746  NA 137   < > 138 134*  --   K 4.2   < > 4.6 5.7* 4.7  CL 104   < > 102 100  --   CO2 22   < > 29 24  --   GLUCOSE 208*   < > 133* 185*  --   BUN 28*   < > 22* 40*  --   CREATININE 7.16*   < > 6.03* 7.88*  --   CALCIUM 7.9*   < > 8.4* 7.8*  --   MG  --   --  2.3  --   --   AST 16  --   --   --   --   ALT 11  --   --   --   --   ALKPHOS 108  --   --   --   --  BILITOT 0.4  --   --   --   --    < > = values in this interval not displayed.   ------------------------------------------------------------------------------------------------------------------  Cardiac Enzymes No results for input(s): TROPONINI in the last 168 hours. ------------------------------------------------------------------------------------------------------------------  RADIOLOGY:  No results found.  ASSESSMENT AND PLAN:   Active Problems:   Right foot infection  1. 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  Podiatry has discussed below-knee amputation. we will continue same antibiotics as he was doing with dialysis. BKA done 12/23/18 Antibiotics stopped 3 days after BKA 2. End-stage renal  disease on hemodialysis Tuesday Thursday and Saturday.   Nephrology following 3. Paroxysmal atrial fibrillation.  On amiodarone, aspirin, Plavix 4. Chronic systolic congestive heart failure. Dialysis to manage fluid 5. Gout bilateral hands allopurinol and oral prednisone 5 mg daily 6. Type 2 diabetes mellitus on diet control only. 7. Chronic loose stools continue same home oral regimen 8. Fall in room    negative CT head.  9. Anemia of ESRD- transfused 1 unit PRBC on 12/27/2018  Patient ready to be discharged from the hospital.  Disposition is home with home health versus skilled nursing facility.  All the records are reviewed and case discussed with Care Management/Social Worker Management plans discussed with the patient, family and they are in agreement.  CODE STATUS: Full  TOTAL TIME TAKING CARE OF THIS PATIENT: 35 minutes  POSSIBLE D/C IN 1-2 DAYS, DEPENDING ON CLINICAL CONDITION  Neita Carp M.D on 12/28/2018   Between 7am to 6pm - Pager - [phone]  After 6pm go to [email or web address] - password [email or web address] [facility]  Office  [phone]  CC: Primary care physician; Tracie Harrier, MD  Note: This dictation was prepared with Dragon dictation along with smaller phrase technology. Any transcriptional errors that result from this process are unintentional.

## 2018-12-28 NOTE — Progress Notes (Signed)
Physical Therapy Treatment Patient Details Name: Paul [name]. MRN: [id number] DOB: [birth year] Today's Date: 12/28/2018    History of Present Illness 53 y/o male here for non-healing wound to R heel, now referred to PT for his new R BK amputation performed on 12/23/18.  PMHx:  osteomyelitis, ESRD, PAF, CHF, DM, gout, GI symptoms      PT Comments    Patient alert, sitting EOB agreeable to PT reported 6/10 R leg pain, pt stated he is trying to not rely on his pain medication. Session focused on promoting strength, functional activity, and single leg balance. Pt was able to perform sit <> stand 2x5 with CGa/supervision, initial education about hand placement for safety with good carry over. Pt demonstrated fair static standing balance, relying on at least 1 UE support for safety. Pt ambulated ~4ft with RW and CGA, quickly fatigued and needed PT to scoot bed closer to allow pt to sit safely. Overall the patient demonstrated progression towards goals. Though patient continues to demonstrate improvement, recommendation for STR remains due to decreased caregiver support at home as well as deficits in balance, endurance, and need for transfer supervision.      Follow Up Recommendations  SNF     Equipment Recommendations  Rolling walker with 5" wheels;3in1 (PT)    Recommendations for Other Services       Precautions / Restrictions Restrictions Weight Bearing Restrictions: Yes RLE Weight Bearing: Non weight bearing    Mobility  Bed Mobility               General bed mobility comments: Pt sitting EOB at start of session  Transfers Overall transfer level: Needs assistance Equipment used: Rolling walker (2 wheeled) Transfers: Lateral/Scoot Transfers;Sit to/from [facility] Sit to Stand: Supervision Stand pivot transfers: Supervision      Lateral/Scoot Transfers: Supervision    Ambulation/Gait Ambulation/Gait assistance: Min guard Gait Distance (Feet): 13  Feet Assistive device: Rolling walker (2 wheeled)       General Gait Details: Pt fatigued quickly, cued for standing rest breaks. Unable to fully make it back to the chair, bed unlocked and locked near patient to allow him to sit.   Stairs             Wheelchair Mobility    Modified Rankin (Stroke Patients Only)       Balance Overall balance assessment: Needs assistance Sitting-balance support: Feet supported Sitting balance-Leahy Scale: Good       Standing balance-Leahy Scale: Poor Standing balance comment: Pt needed at least 1 hand support on RW at all times                            Cognition Arousal/Alertness: Awake/alert Behavior During Therapy: [facility] for tasks assessed/performed;Impulsive Overall Cognitive Status: Within Functional Limits for tasks assessed                                        Exercises Other Exercises Other Exercises: sit to stands 2x5 with seated rest break in between. static standing balance  with UE support x15secs, static standing 1 UE 2x15 seconds, static standing 1 UE support eyes closed 3x15secs Other Exercises: lateral scoot to recliner after hop to gait pattern due to patient fatigue. supervision provided as well as some verbal cues for technique    General Comments  Pertinent Vitals/Pain Pain Assessment: 0-10 Pain Score: 6  Pain Location: R leg Pain Descriptors / Indicators: Sore Pain Intervention(s): Limited activity within patient's tolerance;Monitored during session;Repositioned    Home Living                      Prior Function            PT Goals (current goals can now be found in the care plan section) Progress towards PT goals: Progressing toward goals    Frequency    7X/week      PT Plan Current plan remains appropriate    Co-evaluation              AM-PAC PT "6 Clicks" Mobility   Outcome Measure  Help needed turning from your back to your side  while in a flat bed without using bedrails?: None Help needed moving from lying on your back to sitting on the side of a flat bed without using bedrails?: None Help needed moving to and from a bed to a chair (including a wheelchair)?: A Little Help needed standing up from a chair using your arms (e.g., wheelchair or bedside chair)?: A Little Help needed to walk in hospital room?: A Lot Help needed climbing 3-5 steps with a railing? : Total 6 Click Score: 17    End of Session Equipment Utilized During Treatment: Gait belt Activity Tolerance: Patient tolerated treatment well Patient left: in chair;with call bell/phone within reach;Other (comment)(LE elevated)   PT Visit Diagnosis: Unsteadiness on feet (R26.81);Muscle weakness (generalized) (M62.81)     Time: 6378-5885 PT Time Calculation (min) (ACUTE ONLY): 27 min  Charges:  $Therapeutic Exercise: 23-37 mins                     Lieutenant Diego PT, DPT 2:26 PM,12/28/18 [phone]

## 2018-12-28 NOTE — TOC Progression Note (Signed)
Transition of Care (TOC) - Progression Note    Patient Details  Name: Paul [name]. MRN: [id number] Date of Birth: October 29, 1965  Transition of Care [facility]) CM/SW Contact  [name], RN Phone Number: 12/28/2018, 10:28 AM  Clinical Narrative:    Tammy with [facility] called and stated they do not have a bed to offer,  I spoke with Freda Munro from [facility] and she said she is not sure if they have a bed, she will call me back but if they do have a bed it will not be available until Friday, awaiting a call back from her   Expected Discharge Plan: [place]    Expected Discharge Plan and Services Expected Discharge Plan: [place] Choice: [place] arrangements for the past 2 months: Single Family Home Expected Discharge Date: 12/23/18                                     Social Determinants of Health (SDOH) Interventions    Readmission Risk Interventions Readmission Risk Prevention Plan 11/25/2018 10/06/2018 09/09/2018  Transportation Screening Complete Complete Complete  PCP or Specialist Appt within 3-5 Days Complete Complete Complete  HRI or Home Care Consult Complete Complete Complete  Social Work Consult for [place] Planning/Counseling Complete Complete Not Complete  SW consult not completed comments - - NA  Palliative Care Screening Not Applicable Not Applicable Not Applicable  Medication Review (RN Care Manager) Complete - -  Some recent data might be hidden

## 2018-12-29 LAB — GLUCOSE, CAPILLARY
Glucose-Capillary: 144 mg/dL — ABNORMAL HIGH (ref 70–99)
Glucose-Capillary: 124 mg/dL — ABNORMAL HIGH (ref 70–99)
Glucose-Capillary: 167 mg/dL — ABNORMAL HIGH (ref 70–99)

## 2018-12-29 MED ORDER — EPOETIN ALFA 4000 UNIT/ML IJ SOLN
4000.0000 [IU] | INTRAMUSCULAR | Status: DC
  Administered 2018-12-31: 4000 [IU] via INTRAVENOUS
  Filled 2018-12-29: qty 1

## 2018-12-29 MED ORDER — TUBERCULIN PPD 5 UNIT/0.1ML ID SOLN
5.0000 [IU] | Freq: Once | INTRADERMAL | Status: DC
  Filled 2018-12-29: qty 0.1

## 2018-12-29 NOTE — Progress Notes (Signed)
HD Completed:    12/29/18 1230  Vital Signs  Temp 98 F (36.7 C)  Temp Source Oral  Pulse Rate 80  Pulse Rate Source Dinamap  Resp 20  BP 129/75  BP Location Left Arm  BP Method Automatic  Patient Position (if appropriate) Sitting  Oxygen Therapy  SpO2 100 %  O2 Device Room Air  Pain Assessment  Pain Scale 0-10  Pain Score 0  During Hemodialysis Assessment  Blood Flow Rate (mL/min) 400 mL/min  Arterial Pressure (mmHg) -140 mmHg  Venous Pressure (mmHg) 120 mmHg  Transmembrane Pressure (mmHg) 60 mmHg  Ultrafiltration Rate (mL/min) 710 mL/min  Dialysate Flow Rate (mL/min) 600 ml/min  Conductivity: Machine  14  HD Safety Checks Performed Yes  Intra-Hemodialysis Comments Tx completed

## 2018-12-29 NOTE — TOC Progression Note (Signed)
Transition of Care (TOC) - Progression Note    Patient Details  Name: Paul [name]. MRN: [id number] Date of Birth: August 01, 1965  Transition of Care [facility]) CM/SW Contact  [name], RN Phone Number: 12/29/2018, 9:00 AM  Clinical Narrative:     Medicaid case assigned to Rebeca Alert Case ID # [id number]  Expected Discharge Plan: [place]    Expected Discharge Plan and Services Expected Discharge Plan: [place] Choice: [place] Living arrangements for the past 2 months: Single Family Home Expected Discharge Date: 12/23/18                                     Social Determinants of Health (SDOH) Interventions    Readmission Risk Interventions Readmission Risk Prevention Plan 11/25/2018 10/06/2018 09/09/2018  Transportation Screening Complete Complete Complete  PCP or Specialist Appt within 3-5 Days Complete Complete Complete  HRI or Home Care Consult Complete Complete Complete  Social Work Consult for [place] Planning/Counseling Complete Complete Not Complete  SW consult not completed comments - - NA  Palliative Care Screening Not Applicable Not Applicable Not Applicable  Medication Review (RN Care Manager) Complete - -  Some recent data might be hidden

## 2018-12-29 NOTE — Progress Notes (Signed)
Tx Initiated:    12/29/18 0840  Vital Signs  Temp 98.4 F (36.9 C)  Temp Source Oral  Pulse Rate 88  Pulse Rate Source Dinamap  Resp 13  BP (!) 141/92  BP Location Left Arm  BP Method Automatic  Patient Position (if appropriate) Sitting  Oxygen Therapy  SpO2 100 %  O2 Device Room Air  Pain Assessment  Pain Scale 0-10  Pain Score 0  Dialysis Weight  Weight  (unable to weigh Pt)  Type of Weight Pre-Dialysis  During Hemodialysis Assessment  Blood Flow Rate (mL/min) 400 mL/min  Arterial Pressure (mmHg) -120 mmHg  Venous Pressure (mmHg) 100 mmHg  Transmembrane Pressure (mmHg) 60 mmHg  Ultrafiltration Rate (mL/min) 710 mL/min  Dialysate Flow Rate (mL/min) 600 ml/min  Conductivity: Machine  13.9  HD Safety Checks Performed Yes  Intra-Hemodialysis Comments Tx initiated  Hemodialysis Catheter Right Subclavian Double lumen Permanent (Tunneled)  Placement Date: (c) 11/24/18   Orientation: Right  Access Location: Subclavian  Hemodialysis Catheter Type: Double lumen Permanent (Tunneled)  Site Condition No complications  Blue Lumen Status Blood return noted  Red Lumen Status Blood return noted  Purple Lumen Status N/A  Dressing Type Biopatch;Occlusive  Dressing Status Dry;Intact  Drainage Description None  Dressing Change Due 12/31/18

## 2018-12-29 NOTE — Progress Notes (Signed)
[place] at [place] NAME: Paul [name]    MR#:  [id number]  DATE OF BIRTH:  [birth year]  SUBJECTIVE:  CHIEF COMPLAINT:   Chief Complaint  Patient presents with  . Wound Check   Came with foot infection. 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  Seen in HD Some pain at surgical site No SOB Better appetite  REVIEW OF SYSTEMS:  CONSTITUTIONAL: No fever, fatigue or weakness.  EYES: No blurred or double vision.  EARS, NOSE, AND THROAT: No tinnitus or ear pain.  RESPIRATORY: No cough, shortness of breath, wheezing or hemoptysis.  CARDIOVASCULAR: No chest pain, orthopnea, edema.  GASTROINTESTINAL: No nausea, vomiting, diarrhea or abdominal pain.  GENITOURINARY: No dysuria, hematuria.  ENDOCRINE: No polyuria, nocturia,  HEMATOLOGY: No anemia, easy bruising or bleeding SKIN: No rash or lesion. MUSCULOSKELETAL: No joint pain or arthritis.   NEUROLOGIC: No tingling, numbness, weakness.  PSYCHIATRY: No anxiety or depression.  DRUG ALLERGIES:   Allergies  Allergen Reactions  . Other Other (See Comments)    Cardiac Problems. Pt states he tolerates Toradol. Due to kidney and heart problems per pt  . Ibuprofen Other (See Comments)    Heart problems  . Baclofen Other (See Comments)  . Metformin Diarrhea  . Nsaids     Due to kidney and heart problems per pt    VITALS:  Blood pressure 111/86, pulse 84, temperature 98.4 F (36.9 C), temperature source Oral, resp. rate (!) 21, height 5\' 9"  (1.753 m), weight 87.1 kg, SpO2 100 %.  PHYSICAL EXAMINATION:   GENERAL:  53 y.o.-year-old patient lying in the bed with no acute distress.  EYES: Pupils equal, round, reactive to light and accommodation. No scleral icterus. Extraocular muscles intact.  HEENT: Head atraumatic, normocephalic. Oropharynx and nasopharynx clear.  NECK:  Supple, no jugular venous distention. No thyroid enlargement, no tenderness.  LUNGS: Normal breath sounds bilaterally, no wheezing,  rales,rhonchi or crepitation. No use of accessory muscles of respiration.  CARDIOVASCULAR: S1, S2 normal. No murmurs, rubs, or gallops.  ABDOMEN: Soft, nontender, nondistended. Bowel sounds present. No organomegaly or mass.  EXTREMITIES: No pedal edema, cyanosis, or clubbing. right BKA NEUROLOGIC: Cranial nerves II through XII are intact. Muscle strength 5/5 in all extremities. Sensation intact. Gait not checked.  PSYCHIATRIC: The patient is alert and oriented x 3.  SKIN: No obvious rash, lesion, or ulcer.   Physical Exam LABORATORY PANEL:   CBC Recent Labs  Lab 12/28/18 0746  WBC 13.3*  HGB 9.7*  HCT 32.1*  PLT 384   ------------------------------------------------------------------------------------------------------------------  Chemistries  Recent Labs  Lab 12/23/18 0442 12/24/18 0701 12/28/18 0746  NA 138 134*  --   K 4.6 5.7* 4.7  CL 102 100  --   CO2 29 24  --   GLUCOSE 133* 185*  --   BUN 22* 40*  --   CREATININE 6.03* 7.88*  --   CALCIUM 8.4* 7.8*  --   MG 2.3  --   --    ------------------------------------------------------------------------------------------------------------------  Cardiac Enzymes No results for input(s): TROPONINI in the last 168 hours. ------------------------------------------------------------------------------------------------------------------  RADIOLOGY:  No results found.  ASSESSMENT AND PLAN:   Active Problems:   Right foot infection  1. 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  Podiatry has discussed below-knee amputation. we will continue same antibiotics as he was doing with dialysis. BKA done 12/23/18 Antibiotics stopped 3 days after BKA 2. End-stage renal disease on hemodialysis Tuesday Thursday and Saturday.   Nephrology following 3. Paroxysmal  atrial fibrillation.  On amiodarone, aspirin, Plavix 4. Chronic systolic congestive heart failure. Dialysis to manage fluid 5. Gout bilateral hands allopurinol and  oral prednisone 5 mg daily 6. Type 2 diabetes mellitus on diet control only. 7. Chronic loose stools continue same home oral regimen 8. Fall in room    negative CT head.  9. Anemia of ESRD- transfused 1 unit PRBC on 12/27/2018  Discharge to SNF when bed available  All the records are reviewed and case discussed with Care Management/Social Worker Management plans discussed with the patient, family and they are in agreement.  CODE STATUS: Full  TOTAL TIME TAKING CARE OF THIS PATIENT: 35 minutes  POSSIBLE D/C IN 1-2 DAYS, DEPENDING ON CLINICAL CONDITION  Neita Carp M.D on 12/29/2018   Between 7am to 6pm - Pager - [phone]  After 6pm go to [email or web address] - password [email or web address] [place]  Office  [phone]  CC: Primary care physician; Tracie Harrier, MD  Note: This dictation was prepared with Dragon dictation along with smaller phrase technology. Any transcriptional errors that result from this process are unintentional.

## 2018-12-29 NOTE — Progress Notes (Deleted)
Pre HD:    12/29/18 0840  Vital Signs  Pulse Rate 88  Resp 13  BP (!) 141/92  BP Location Left Arm  BP Method Automatic  Patient Position (if appropriate) Sitting  Oxygen Therapy  SpO2 100 %  O2 Device Room Air  Pain Assessment  Pain Scale 0-10  Pain Score 0  Dialysis Weight  Weight  (unable to weigh Pt)  Type of Weight Pre-Dialysis  Pre Treatment Patient Checks  Vascular access used during treatment Catheter  HD catheter dressing before treatment WDL  Patient is receiving dialysis in a chair Yes  Hepatitis B Surface Antigen Results Negative  Date Hepatitis B Surface Antigen Drawn 12/22/18  Hepatitis B Surface Antibody  (<3.1)  Date Hepatitis B Surface Antibody Drawn 11/24/18  Hemodialysis Consent Verified Yes  Hemodialysis Standing Orders Initiated Yes  ECG (Telemetry) Monitor On Yes  Prime Ordered Normal Saline  Length of  DialysisTreatment -hour(s) 3.5 Hour(s)  Dialysis Treatment Comments  (Na 140)  Dialyzer Elisio 17H NR  Dialysate 2K;2.5 Ca  Dialysate Flow Ordered 600  Blood Flow Rate Ordered 400 mL/min  Ultrafiltration Goal 2 Liters  Dialysis Blood Pressure Support Ordered Normal Saline  Education / Care Plan  Dialysis Education Provided Yes  Documented Education in Care Plan Yes  Outpatient Plan of Care Reviewed and on Chart Yes  Hemodialysis Catheter Right Subclavian Double lumen Permanent (Tunneled)  Placement Date: (c) 11/24/18   Orientation: Right  Access Location: Subclavian  Hemodialysis Catheter Type: Double lumen Permanent (Tunneled)  Site Condition No complications  Blue Lumen Status Blood return noted  Red Lumen Status Blood return noted  Purple Lumen Status N/A  Dressing Type Biopatch;Occlusive  Dressing Status Intact  Drainage Description None  Dressing Change Due 12/31/18

## 2018-12-29 NOTE — Progress Notes (Signed)
Pre HD:    12/29/18 0830  Vital Signs  Temp 98.4 F (36.9 C)  Temp Source Oral  Pulse Rate 89  Resp (!) 21  BP (!) 157/98  BP Location Left Arm  BP Method Automatic  Patient Position (if appropriate) Sitting  Oxygen Therapy  SpO2 100 %  O2 Device Room Air  Pain Assessment  Pain Scale 0-10  Pain Score 0  Dialysis Weight  Weight  (unable to weigh Pt)  Type of Weight Pre-Dialysis  Time-Out for Hemodialysis  What Procedure? HD   Pt Identifiers(min of two) First/Last Name;MRN/Account#;Pt's DOB(use if MRN/Acct# not available  Correct Site? Yes  Correct Side? Yes  Correct Procedure? Yes  Consents Verified? Yes  Rad Studies Available? N/A  Safety Precautions Reviewed? Yes  Engineer, civil (consulting) Number 3  Station Number 3  UF/Alarm Test Passed  Conductivity: Meter 13.8  Conductivity: Machine  13.9  pH 7.2  Reverse Osmosis main   Normal Saline Lot Number [id number]  Dialyzer Lot Number [id number]  Disposable Set Lot Number [id number]  Machine Temperature 98.6 F (37 C)  Musician and Audible Yes  Blood Lines Intact and Secured Yes  Pre Treatment Patient Checks  Vascular access used during treatment Catheter  HD catheter dressing before treatment WDL  Patient is receiving dialysis in a chair Yes  Hepatitis B Surface Antigen Results Negative  Date Hepatitis B Surface Antigen Drawn 12/22/18  Hepatitis B Surface Antibody  (<3.1)  Date Hepatitis B Surface Antibody Drawn 11/24/18  Hemodialysis Consent Verified Yes  Hemodialysis Standing Orders Initiated Yes  ECG (Telemetry) Monitor On Yes  Prime Ordered Normal Saline  Length of  DialysisTreatment -hour(s) 3.5 Hour(s)  Dialysis Treatment Comments  (Na 140)  Dialyzer Elisio 17H NR  Dialysate 2K;2.5 Ca  Dialysate Flow Ordered 600  Blood Flow Rate Ordered 400 mL/min  Ultrafiltration Goal 2 Liters  Dialysis Blood Pressure Support Ordered Normal Saline  Hemodialysis Catheter Right Subclavian Double lumen Permanent  (Tunneled)  Placement Date: (c) 11/24/18   Orientation: Right  Access Location: Subclavian  Hemodialysis Catheter Type: Double lumen Permanent (Tunneled)  Site Condition No complications  Blue Lumen Status Blood return noted  Red Lumen Status Blood return noted  Purple Lumen Status N/A  Dressing Type Biopatch;Occlusive  Dressing Status Intact  Drainage Description None  Dressing Change Due 12/31/18

## 2018-12-29 NOTE — Progress Notes (Signed)
Post HD Assessment:    12/29/18 1231  Neurological  Level of Consciousness Alert  Orientation Level Oriented X4  Respiratory  Respiratory Pattern Regular;Unlabored  Chest Assessment Chest expansion symmetrical  Bilateral Breath Sounds Clear  Cardiac  Pulse Regular  Heart Sounds S1, S2  Jugular Venous Distention (JVD) No  ECG Monitor Yes  Cardiac Rhythm NSR  Vascular  R Radial Pulse +2  L Radial Pulse +2  Musculoskeletal  Musculoskeletal (WDL) X  Generalized Weakness  (Requires assistance to weigh on scale)  Psychosocial  Psychosocial (WDL) WDL

## 2018-12-29 NOTE — Progress Notes (Signed)
Post HD:   12/29/18 1231  Vital Signs  Temp 98 F (36.7 C)  Temp Source Oral  Pulse Rate 81  Pulse Rate Source Dinamap  Resp 18  BP 129/75  BP Location Left Arm  BP Method Automatic  Patient Position (if appropriate) Sitting  Oxygen Therapy  SpO2 100 %  O2 Device Room Air  Pain Assessment  Pain Scale 0-10  Pain Score 0  Dialysis Weight  Weight 71.3 kg  Type of Weight Post-Dialysis  Post-Hemodialysis Assessment  Rinseback Volume (mL) 250 mL  KECN 81.2 V  Dialyzer Clearance Lightly streaked  Duration of HD Treatment -hour(s) 3.5 hour(s)  Hemodialysis Intake (mL) 500 mL  UF Total -Machine (mL) 2502 mL  Net UF (mL) 2002 mL  Tolerated HD Treatment Yes  AVG/AVF Arterial Site Held (minutes)  (n/a)  AVG/AVF Venous Site Held (minutes)  (n/a)  Education / Care Plan  Dialysis Education Provided Yes  Documented Education in Care Plan Yes  Outpatient Plan of Care Reviewed and on Chart Yes  Hemodialysis Catheter Right Subclavian Double lumen Permanent (Tunneled)  Placement Date: (c) 11/24/18   Orientation: Right  Access Location: Subclavian  Hemodialysis Catheter Type: Double lumen Permanent (Tunneled)  Site Condition No complications  Blue Lumen Status Heparin locked  Red Lumen Status Heparin locked  Purple Lumen Status N/A  Catheter fill solution Heparin 1000 units/ml  Catheter fill volume (Arterial) 1.5 cc  Catheter fill volume (Venous) 1.5  Dressing Type Biopatch;Occlusive  Dressing Status Dry;Intact  Drainage Description None  Dressing Change Due 12/31/18  Post treatment catheter status Capped and Clamped

## 2018-12-29 NOTE — Care Management Important Message (Signed)
Important Message  Patient Details  Name: Paul [name]. MRN: [id number] Date of Birth: 05-21-1965   Medicare Important Message Given:  Yes     [name], RN 12/29/2018, 3:18 PM

## 2018-12-29 NOTE — Progress Notes (Signed)
Pre HD Assessment:    12/29/18 0830  Neurological  Level of Consciousness Alert  Orientation Level Oriented X4  Respiratory  Respiratory Pattern Regular;Unlabored  Chest Assessment Chest expansion symmetrical  Bilateral Breath Sounds Clear  Cardiac  Pulse Regular  Heart Sounds S1, S2  Vascular  R Radial Pulse +2  L Radial Pulse +2  Musculoskeletal  Musculoskeletal (WDL) X  Generalized Weakness  (Pt able to transfer independently with walker air stand by)  Risk analyst  Psychosocial  Psychosocial (WDL) WDL

## 2018-12-29 NOTE — Progress Notes (Signed)
Physical Therapy Treatment Patient Details Name: Paul [name]. MRN: [id number] DOB: 02/25/1966 Today's Date: 12/29/2018    History of Present Illness 53 y/o male here for non-healing wound to R heel, now referred to PT for his new R BK amputation performed on 12/23/18.  PMHx:  osteomyelitis, ESRD, PAF, CHF, DM, gout, GI symptoms      PT Comments    Patient sitting EOB eating lunch, agreeable to PT, denied any intense pain throughout session. Session focused on promoting strength and endurance, as well as balance. The patient was able to perform standing balance with CGA, transfers with supervision, and ambulated ~87ft with RW and CGA. Pt exhibited hop to gait pattern with very small L foot clearance, no LOB noted but patient still exhibits fatigue. The patient continued to demonstrated excellent motivation with PT and progression towards goals. Current plan remains appropriate due to decreased caregiver support at home and limitations in functional mobility.     Follow Up Recommendations  SNF     Equipment Recommendations  Rolling walker with 5" wheels;3in1 (PT)    Recommendations for Other Services       Precautions / Restrictions Precautions Precautions: Fall Restrictions Weight Bearing Restrictions: Yes RLE Weight Bearing: Non weight bearing    Mobility  Bed Mobility               General bed mobility comments: Pt sitting EOB at start of session  Transfers Overall transfer level: Needs assistance Equipment used: Rolling walker (2 wheeled) Transfers: Sit to/from Stand Sit to Stand: Supervision Stand pivot transfers: Supervision       General transfer comment: good use of UE for rising and lowering safely during transfers  Ambulation/Gait Ambulation/Gait assistance: Min guard Gait Distance (Feet): 10 Feet Assistive device: Rolling walker (2 wheeled)       General Gait Details: Patient able to hop to ~65ft with CGA, fatigued comparable to last session,  but able to return to the chair without stopping.   Stairs             Wheelchair Mobility    Modified Rankin (Stroke Patients Only)       Balance Overall balance assessment: Needs assistance Sitting-balance support: Feet supported Sitting balance-Leahy Scale: Good     Standing balance support: Bilateral upper extremity supported Standing balance-Leahy Scale: Poor                              Cognition Arousal/Alertness: Awake/alert Behavior During Therapy: WFL for tasks assessed/performed;Impulsive Overall Cognitive Status: Within Functional Limits for tasks assessed                                        Exercises General Exercises - Lower Extremity Long Arc Quad: AROM;Strengthening;Both;15 reps Hip ABduction/ADduction: AROM;Strengthening;Both;15 reps(PT resisted motion) Hip Flexion/Marching: AROM;Strengthening;Both;15 reps Other Exercises Other Exercises: unilateral UE support on R and then on the L x3 trials with 15sec holds, more challenged without RUE support than on the L.    General Comments        Pertinent Vitals/Pain Pain Assessment: Faces Faces Pain Scale: Hurts a little bit Pain Location: R leg with hopping Pain Intervention(s): Limited activity within patient's tolerance;Monitored during session;Repositioned    Home Living  Prior Function            PT Goals (current goals can now be found in the care plan section) Progress towards PT goals: Progressing toward goals    Frequency    7X/week      PT Plan Current plan remains appropriate    Co-evaluation              AM-PAC PT "6 Clicks" Mobility   Outcome Measure  Help needed turning from your back to your side while in a flat bed without using bedrails?: None Help needed moving from lying on your back to sitting on the side of a flat bed without using bedrails?: None Help needed moving to and from a bed to a chair  (including a wheelchair)?: A Little Help needed standing up from a chair using your arms (e.g., wheelchair or bedside chair)?: A Little Help needed to walk in hospital room?: A Lot Help needed climbing 3-5 steps with a railing? : Total 6 Click Score: 17    End of Session Equipment Utilized During Treatment: Gait belt Activity Tolerance: Patient tolerated treatment well Patient left: in chair;with call bell/phone within reach;Other (comment) Nurse Communication: Mobility status PT Visit Diagnosis: Unsteadiness on feet (R26.81);Muscle weakness (generalized) (M62.81)     Time: 0814-4818 PT Time Calculation (min) (ACUTE ONLY): 21 min  Charges:  $Therapeutic Exercise: 8-22 mins                    Lieutenant Diego PT, DPT 2:41 PM,12/29/18 [phone]

## 2018-12-29 NOTE — Progress Notes (Signed)
PT Cancellation Note  Patient Details Name: Paul [name]. MRN: [id number] DOB: 08/14/1965   Cancelled Treatment:    Reason Eval/Treat Not Completed: Patient at procedure or test/unavailable   Lieutenant Diego PT, DPT 9:42 AM,12/29/18 [phone]

## 2018-12-29 NOTE — TOC Progression Note (Addendum)
Transition of Care (TOC) - Progression Note    Patient Details  Name: Paul [name]. MRN: [id number] Date of Birth: 10-Jul-1965  Transition of Care [facility]) CM/SW Contact  [name], RN Phone Number: 12/29/2018, 1:10 PM  Clinical Narrative:    Paul [name] with [name] at [facility] She accepted the patient and will accept the LOG, They use [facility] and [facility] and have transportation Mon- Sat I notified [name] the Dialysis coordinator and she will work with both to see when and which dialysis facility can accept and offer a chair time I faxed the LOG to [name] to have completed and faxed to [facility]   Expected Discharge Plan: Paul [name]    Expected Discharge Plan and Services Expected Discharge Plan: [name] Choice: [place] arrangements for the past 2 months: Single Family Home Expected Discharge Date: 12/23/18                                     Social Determinants of Health (SDOH) Interventions    Readmission Risk Interventions Readmission Risk Prevention Plan 11/25/2018 10/06/2018 09/09/2018  Transportation Screening Complete Complete Complete  PCP or Specialist Appt within 3-5 Days Complete Complete Complete  HRI or Home Care Consult Complete Complete Complete  Social Work Consult for [place] Planning/Counseling Complete Complete Not Complete  SW consult not completed comments - - NA  Palliative Care Screening Not Applicable Not Applicable Not Applicable  Medication Review (RN Care Manager) Complete - -  Some recent data might be hidden

## 2018-12-29 NOTE — Progress Notes (Signed)
[facility]  ROUNDING NOTE   Subjective:   Patient seen during dialysis Tolerating well   HEMODIALYSIS FLOWSHEET:  Blood Flow Rate (mL/min): 400 mL/min Arterial Pressure (mmHg): -140 mmHg Venous Pressure (mmHg): 120 mmHg Transmembrane Pressure (mmHg): 60 mmHg Ultrafiltration Rate (mL/min): 710 mL/min Dialysate Flow Rate (mL/min): 600 ml/min Conductivity: Machine : 14 Conductivity: Machine : 14 Dialysis Fluid Bolus: Normal Saline Bolus Amount (mL): 250 mL     Objective:  Vital signs in last 24 hours:  Temp:  [97.8 F (36.6 C)-98.4 F (36.9 C)] 98 F (36.7 C) (08/27 1300) Pulse Rate:  [75-89] 87 (08/27 1300) Resp:  [12-27] 18 (08/27 1300) BP: (108-157)/(68-98) 149/82 (08/27 1300) SpO2:  [98 %-100 %] 100 % (08/27 1300) Weight:  [71.3 kg] 71.3 kg (08/27 1231)  Weight change:  Filed Weights   12/29/18 1231  Weight: 71.3 kg    Intake/Output: I/O last 3 completed shifts: In: 97 [Blood:880] Out: 50 [Urine:50]   Intake/Output this shift:  Total I/O In: -  Out: 2002 [Other:2002]  Physical Exam: General: No acute distress,   Head: Normocephalic, atraumatic. Moist oral mucosal membranes  Eyes: Anicteric  Neck: Supple   Lungs:  Clear to auscultation, normal effort  Heart: regular  Abdomen:  Soft, nontender, bowel sounds present  Extremities: Left  trace edema, right BKA with dressings.   Neurologic: Awake, alert, following commands  Skin: No lesions  Access: IJ PermCath.    Basic Metabolic Panel: Recent Labs  Lab 12/23/18 0442 12/24/18 0701 12/28/18 0746  NA 138 134*  --   K 4.6 5.7* 4.7  CL 102 100  --   CO2 29 24  --   GLUCOSE 133* 185*  --   BUN 22* 40*  --   CREATININE 6.03* 7.88*  --   CALCIUM 8.4* 7.8*  --   MG 2.3  --   --   PHOS  --  5.2*  --     Liver Function Tests: Recent Labs  Lab 12/24/18 0701  ALBUMIN 2.4*   No results for input(s): LIPASE, AMYLASE in the last 168 hours. No results for input(s): AMMONIA in the  last 168 hours.  CBC: Recent Labs  Lab 12/23/18 0442 12/23/18 1712 12/24/18 0701 12/26/18 0529 12/28/18 0746  WBC 9.9 12.4* 15.6* 13.0* 13.3*  NEUTROABS  --   --   --   --  11.1*  HGB 8.2* 7.7* 6.8* 6.9* 9.7*  HCT 28.1* 26.2* 22.7* 23.7* 32.1*  MCV 92.4 92.6 91.5 93.7 92.8  PLT 432* 414* 399 352 384    Cardiac Enzymes: Recent Labs  Lab 12/23/18 0442  CKTOTAL 29*    BNP: Invalid input(s): POCBNP  CBG: Recent Labs  Lab 12/28/18 1136 12/28/18 1632 12/28/18 2124 12/29/18 0735 12/29/18 1259  GLUCAP 158* 161* 188* 144* 124*    Microbiology: Results for orders placed or performed during the hospital encounter of 12/21/18  SARS Coronavirus 2 [facility] order, Performed in [facility] hospital lab) Nasopharyngeal Nasopharyngeal Swab     Status: None   Collection Time: 12/21/18  5:14 PM   Specimen: Nasopharyngeal Swab  Result Value Ref Range Status   SARS Coronavirus 2 NEGATIVE NEGATIVE Final    Comment: (NOTE) If result is NEGATIVE SARS-CoV-2 target nucleic acids are NOT DETECTED. The SARS-CoV-2 RNA is generally detectable in upper and lower  respiratory specimens during the acute phase of infection. The lowest  concentration of SARS-CoV-2 viral copies this assay can detect is 250  copies / mL. A  negative result does not preclude SARS-CoV-2 infection  and should not be used as the sole basis for treatment or other  patient management decisions.  A negative result may occur with  improper specimen collection / handling, submission of specimen other  than nasopharyngeal swab, presence of viral mutation(s) within the  areas targeted by this assay, and inadequate number of viral copies  (<250 copies / mL). A negative result must be combined with clinical  observations, patient history, and epidemiological information. If result is POSITIVE SARS-CoV-2 target nucleic acids are DETECTED. The SARS-CoV-2 RNA is generally detectable in upper and lower  respiratory specimens  dur ing the acute phase of infection.  Positive  results are indicative of active infection with SARS-CoV-2.  Clinical  correlation with patient history and other diagnostic information is  necessary to determine patient infection status.  Positive results do  not rule out bacterial infection or co-infection with other viruses. If result is PRESUMPTIVE POSTIVE SARS-CoV-2 nucleic acids MAY BE PRESENT.   A presumptive positive result was obtained on the submitted specimen  and confirmed on repeat testing.  While 2019 novel coronavirus  (SARS-CoV-2) nucleic acids may be present in the submitted sample  additional confirmatory testing may be necessary for epidemiological  and / or clinical management purposes  to differentiate between  SARS-CoV-2 and other Sarbecovirus currently known to infect humans.  If clinically indicated additional testing with an alternate test  methodology [phone]) is advised. The SARS-CoV-2 RNA is generally  detectable in upper and lower respiratory sp ecimens during the acute  phase of infection. The expected result is Negative. Fact Sheet for Patients:  StrictlyIdeas.no Fact Sheet for Healthcare Providers: BankingDealers.co.za This test is not yet approved or cleared by the [place] FDA and has been authorized for detection and/or diagnosis of SARS-CoV-2 by FDA under an Emergency Use Authorization (EUA).  This EUA will remain in effect (meaning this test can be used) for the duration of the COVID-19 declaration under Section 564(b)(1) of the Act, 21 U.S.C. section 360bbb-3(b)(1), unless the authorization is terminated or revoked sooner. Performed at [facility], [address], [place], [place] [address]   Aerobic/Anaerobic Culture (surgical/deep wound)     Status: None   Collection Time: 12/22/18  8:00 AM   Specimen: Wound  Result Value Ref Range Status   Specimen Description   Final     WOUND Performed at [facility], [address], [place], [place] [address]    Special Requests   Final    RIGHT FOOT Performed at [facility], [address] [place], [place] [address]    Gram Stain   Final    RARE WBC PRESENT, PREDOMINANTLY PMN FEW GRAM VARIABLE ROD    Culture   Final    ABUNDANT MULTIPLE ORGANISMS PRESENT, NONE PREDOMINANT NO ANAEROBES ISOLATED Performed at [facility], [place] [address] [place], [place] [address]    Report Status 12/27/2018 FINAL  Final  CULTURE, BLOOD (ROUTINE X 2) w Reflex to ID Panel     Status: None   Collection Time: 12/22/18  9:20 PM   Specimen: BLOOD  Result Value Ref Range Status   Specimen Description BLOOD LEFT ASSIST CONTROL  Final   Special Requests   Final    BOTTLES DRAWN AEROBIC AND ANAEROBIC Blood Culture adequate volume   Culture   Final    NO GROWTH 5 DAYS Performed at [facility], [address], [place], [place] [address]  Report Status 12/27/2018 FINAL  Final  CULTURE, BLOOD (ROUTINE X 2) w Reflex to ID Panel     Status: None   Collection Time: 12/22/18  9:20 PM   Specimen: BLOOD  Result Value Ref Range Status   Specimen Description BLOOD RIGHT ARM  Final   Special Requests   Final    BOTTLES DRAWN AEROBIC AND ANAEROBIC Blood Culture adequate volume   Culture   Final    NO GROWTH 5 DAYS Performed at [facility], [address] [place], [place] [address]    Report Status 12/27/2018 FINAL  Final  MRSA PCR Screening     Status: None   Collection Time: 12/23/18 11:01 AM   Specimen: Nasopharyngeal  Result Value Ref Range Status   MRSA by PCR NEGATIVE NEGATIVE Final    Comment:        The GeneXpert MRSA Assay (FDA approved for NASAL specimens only), is one component of a comprehensive MRSA colonization surveillance program. It is not intended to diagnose MRSA infection nor to guide or monitor treatment for MRSA infections. Performed at [facility],  [address] [place], [place] [address]     Coagulation Studies: No results for input(s): LABPROT, INR in the last 72 hours.  Urinalysis: No results for input(s): COLORURINE, LABSPEC, PHURINE, GLUCOSEU, HGBUR, BILIRUBINUR, KETONESUR, PROTEINUR, UROBILINOGEN, NITRITE, LEUKOCYTESUR in the last 72 hours.  Invalid input(s): APPERANCEUR    Imaging: No results found.   Medications:   . sodium chloride     . allopurinol  100 mg Oral Daily  . amiodarone  100 mg Oral Daily  . aspirin  81 mg Oral Daily  . calcium acetate  1,334 mg Oral TID WC  . carvedilol  3.125 mg Oral BID  . Chlorhexidine Gluconate Cloth  6 each Topical Q0600  . clopidogrel  75 mg Oral Daily  . dicyclomine  10 mg Oral TID AC & HS  . ferrous sulfate  325 mg Oral BID WC  . gabapentin  100 mg Oral TID  . heparin  5,000 Units Subcutaneous Q8H  . insulin aspart  0-5 Units Subcutaneous QHS  . insulin aspart  0-9 Units Subcutaneous TID WC  . predniSONE  5 mg Oral Q breakfast  . sodium chloride flush  3 mL Intravenous Q12H   sodium chloride, acetaminophen **OR** acetaminophen, HYDROcodone-acetaminophen, loperamide, morphine injection, ondansetron **OR** ondansetron (ZOFRAN) IV, sodium chloride flush  Assessment/ Plan:  [name] is a 53 y.o. black male with type 2 diabetes mellitus insulin dependent, diabetic neuropathy, hypertension, coronary artery disease status post CABG, systolic and diastolic congestive heart failure, status post AICD, carotid stenosis, stroke, obstructive sleep apnea, peripheral vascular disease, gout, osteomyelitis of heel. Underwent right BKA by Dr. 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   [place]   #.  ESRD on HD:   - Continue TTS schedule - sit in chair during dialysis  #. Anemia of chronic kidney disease:  Status post PRBC transfusion on 8/22.  - EPO with HD treatment.  Lab Results  Component Value Date   HGB 9.7 (L) 12/28/2018  Hgb staying stable EPO with HD     #. Secondary Hyperparathyroidism:  not currently on binders.  Lab Results  Component Value Date   PTH 66 (H) 07/31/2018   CALCIUM 7.8 (L) 12/24/2018   PHOS 5.2 (H) 12/24/2018     #. Diabetes mellitus type II with chronic kidney disease: history of poor control.  Lab Results  Component Value Date   HGBA1C 7.5 (H) 12/23/2018  currently sliding scale   #. 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 Dr. Lucky Cowboy.   - off Abx - Appreciate vascular, podiatry and infectious disease input.    LOS: [place] 8/27/20202:31 PM

## 2018-12-30 LAB — GLUCOSE, CAPILLARY
Glucose-Capillary: 188 mg/dL — ABNORMAL HIGH (ref 70–99)
Glucose-Capillary: 123 mg/dL — ABNORMAL HIGH (ref 70–99)
Glucose-Capillary: 152 mg/dL — ABNORMAL HIGH (ref 70–99)
Glucose-Capillary: 159 mg/dL — ABNORMAL HIGH (ref 70–99)
Glucose-Capillary: 192 mg/dL — ABNORMAL HIGH (ref 70–99)

## 2018-12-30 LAB — SARS CORONAVIRUS 2 (TAT 6-24 HRS): SARS Coronavirus 2: NEGATIVE

## 2018-12-30 NOTE — Progress Notes (Signed)
[facility]  ROUNDING NOTE   Subjective:   Patient is doing fair No acute c/o Worked with PT today  Objective:  Vital signs in last 24 hours:  Temp:  [97.8 F (36.6 C)-98 F (36.7 C)] 97.8 F (36.6 C) (08/28 0818) Pulse Rate:  [77-87] 87 (08/28 0818) Resp:  [12-27] 18 (08/28 0818) BP: (129-152)/(71-83) 141/80 (08/28 0818) SpO2:  [98 %-100 %] 99 % (08/28 0818) Weight:  [71.3 kg] 71.3 kg (08/27 1231)  Weight change:  Filed Weights   12/29/18 1231  Weight: 71.3 kg    Intake/Output: I/O last 3 completed shifts: In: -  Out: 2002 [Other:2002]   Intake/Output this shift:  Total I/O In: 300 [P.O.:300] Out: -   Physical Exam: General: No acute distress,   Head: Normocephalic, atraumatic. Moist oral mucosal membranes  Eyes: Anicteric  Neck: Supple   Lungs:  Clear to auscultation, normal effort  Heart: regular  Abdomen:  Soft, nontender, bowel sounds present  Extremities: Left  trace edema, right BKA with dressings.   Neurologic: Awake, alert, following commands  Skin: No lesions  Access: IJ PermCath.    Basic Metabolic Panel: Recent Labs  Lab 12/24/18 0701 12/28/18 0746  NA 134*  --   K 5.7* 4.7  CL 100  --   CO2 24  --   GLUCOSE 185*  --   BUN 40*  --   CREATININE 7.88*  --   CALCIUM 7.8*  --   PHOS 5.2*  --     Liver Function Tests: Recent Labs  Lab 12/24/18 0701  ALBUMIN 2.4*   No results for input(s): LIPASE, AMYLASE in the last 168 hours. No results for input(s): AMMONIA in the last 168 hours.  CBC: Recent Labs  Lab 12/23/18 1712 12/24/18 0701 12/26/18 0529 12/28/18 0746  WBC 12.4* 15.6* 13.0* 13.3*  NEUTROABS  --   --   --  11.1*  HGB 7.7* 6.8* 6.9* 9.7*  HCT 26.2* 22.7* 23.7* 32.1*  MCV 92.6 91.5 93.7 92.8  PLT 414* 399 352 384    Cardiac Enzymes: No results for input(s): CKTOTAL, CKMB, CKMBINDEX, TROPONINI in the last 168 hours.  BNP: Invalid input(s): POCBNP  CBG: Recent Labs  Lab 12/29/18 0735  12/29/18 1259 12/29/18 1654 12/29/18 2131 12/30/18 0817  GLUCAP 144* 124* 167* 188* 123*    Microbiology: Results for orders placed or performed during the hospital encounter of 12/21/18  SARS Coronavirus 2 [facility] order, Performed in [facility] hospital lab) Nasopharyngeal Nasopharyngeal Swab     Status: None   Collection Time: 12/21/18  5:14 PM   Specimen: Nasopharyngeal Swab  Result Value Ref Range Status   SARS Coronavirus 2 NEGATIVE NEGATIVE Final    Comment: (NOTE) If result is NEGATIVE SARS-CoV-2 target nucleic acids are NOT DETECTED. The SARS-CoV-2 RNA is generally detectable in upper and lower  respiratory specimens during the acute phase of infection. The lowest  concentration of SARS-CoV-2 viral copies this assay can detect is 250  copies / mL. A negative result does not preclude SARS-CoV-2 infection  and should not be used as the sole basis for treatment or other  patient management decisions.  A negative result may occur with  improper specimen collection / handling, submission of specimen other  than nasopharyngeal swab, presence of viral mutation(s) within the  areas targeted by this assay, and inadequate number of viral copies  (<250 copies / mL). A negative result must be combined with clinical  observations, patient history, and epidemiological  information. If result is POSITIVE SARS-CoV-2 target nucleic acids are DETECTED. The SARS-CoV-2 RNA is generally detectable in upper and lower  respiratory specimens dur ing the acute phase of infection.  Positive  results are indicative of active infection with SARS-CoV-2.  Clinical  correlation with patient history and other diagnostic information is  necessary to determine patient infection status.  Positive results do  not rule out bacterial infection or co-infection with other viruses. If result is PRESUMPTIVE POSTIVE SARS-CoV-2 nucleic acids MAY BE PRESENT.   A presumptive positive result was obtained on the  submitted specimen  and confirmed on repeat testing.  While 2019 novel coronavirus  (SARS-CoV-2) nucleic acids may be present in the submitted sample  additional confirmatory testing may be necessary for epidemiological  and / or clinical management purposes  to differentiate between  SARS-CoV-2 and other Sarbecovirus currently known to infect humans.  If clinically indicated additional testing with an alternate test  methodology [phone]) is advised. The SARS-CoV-2 RNA is generally  detectable in upper and lower respiratory sp ecimens during the acute  phase of infection. The expected result is Negative. Fact Sheet for Patients:  StrictlyIdeas.no Fact Sheet for Healthcare Providers: BankingDealers.co.za This test is not yet approved or cleared by the [place] FDA and has been authorized for detection and/or diagnosis of SARS-CoV-2 by FDA under an Emergency Use Authorization (EUA).  This EUA will remain in effect (meaning this test can be used) for the duration of the COVID-19 declaration under Section 564(b)(1) of the Act, 21 U.S.C. section 360bbb-3(b)(1), unless the authorization is terminated or revoked sooner. Performed at [facility], [address], [place], [place] [address]   Aerobic/Anaerobic Culture (surgical/deep wound)     Status: None   Collection Time: 12/22/18  8:00 AM   Specimen: Wound  Result Value Ref Range Status   Specimen Description   Final    WOUND Performed at [facility], [address], [place], [place] [address]    Special Requests   Final    RIGHT FOOT Performed at [facility], [address] [place], [place] [address]    Gram Stain   Final    RARE WBC PRESENT, PREDOMINANTLY PMN FEW GRAM VARIABLE ROD    Culture   Final    ABUNDANT MULTIPLE ORGANISMS PRESENT, NONE PREDOMINANT NO ANAEROBES ISOLATED Performed at [facility], [place] [address] [place],  [place] [address]    Report Status 12/27/2018 FINAL  Final  CULTURE, BLOOD (ROUTINE X 2) w Reflex to ID Panel     Status: None   Collection Time: 12/22/18  9:20 PM   Specimen: BLOOD  Result Value Ref Range Status   Specimen Description BLOOD LEFT ASSIST CONTROL  Final   Special Requests   Final    BOTTLES DRAWN AEROBIC AND ANAEROBIC Blood Culture adequate volume   Culture   Final    NO GROWTH 5 DAYS Performed at [facility], [address] [place], [place] [address]    Report Status 12/27/2018 FINAL  Final  CULTURE, BLOOD (ROUTINE X 2) w Reflex to ID Panel     Status: None   Collection Time: 12/22/18  9:20 PM   Specimen: BLOOD  Result Value Ref Range Status   Specimen Description BLOOD RIGHT ARM  Final   Special Requests   Final    BOTTLES DRAWN AEROBIC AND ANAEROBIC Blood Culture adequate volume   Culture   Final    NO GROWTH 5 DAYS  Performed at [facility], [address] [place], [place] [address]    Report Status 12/27/2018 FINAL  Final  MRSA PCR Screening     Status: None   Collection Time: 12/23/18 11:01 AM   Specimen: Nasopharyngeal  Result Value Ref Range Status   MRSA by PCR NEGATIVE NEGATIVE Final    Comment:        The GeneXpert MRSA Assay (FDA approved for NASAL specimens only), is one component of a comprehensive MRSA colonization surveillance program. It is not intended to diagnose MRSA infection nor to guide or monitor treatment for MRSA infections. Performed at [facility], [address] [place], [place] [address]     Coagulation Studies: No results for input(s): LABPROT, INR in the last 72 hours.  Urinalysis: No results for input(s): COLORURINE, LABSPEC, PHURINE, GLUCOSEU, HGBUR, BILIRUBINUR, KETONESUR, PROTEINUR, UROBILINOGEN, NITRITE, LEUKOCYTESUR in the last 72 hours.  Invalid input(s): APPERANCEUR    Imaging: No results found.   Medications:   . sodium chloride     . allopurinol  100 mg Oral Daily  .  amiodarone  100 mg Oral Daily  . aspirin  81 mg Oral Daily  . calcium acetate  1,334 mg Oral TID WC  . carvedilol  3.125 mg Oral BID  . Chlorhexidine Gluconate Cloth  6 each Topical Q0600  . clopidogrel  75 mg Oral Daily  . dicyclomine  10 mg Oral TID AC & HS  . [START ON 12/31/2018] epoetin (EPOGEN/PROCRIT) injection  4,000 Units Intravenous Q T,Th,Sa-HD  . ferrous sulfate  325 mg Oral BID WC  . gabapentin  100 mg Oral TID  . heparin  5,000 Units Subcutaneous Q8H  . insulin aspart  0-5 Units Subcutaneous QHS  . insulin aspart  0-9 Units Subcutaneous TID WC  . predniSONE  5 mg Oral Q breakfast  . sodium chloride flush  3 mL Intravenous Q12H  . tuberculin  5 Units Intradermal Once   sodium chloride, acetaminophen **OR** acetaminophen, HYDROcodone-acetaminophen, loperamide, morphine injection, ondansetron **OR** ondansetron (ZOFRAN) IV, sodium chloride flush  Assessment/ Plan:  [name] is a 53 y.o. black male with type 2 diabetes mellitus insulin dependent, diabetic neuropathy, hypertension, coronary artery disease status post CABG, systolic and diastolic congestive heart failure, status post AICD, carotid stenosis, stroke, obstructive sleep apnea, peripheral vascular disease, gout, osteomyelitis of heel. Underwent right BKA by Dr. 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   [place]   #.  ESRD on HD:   - Continue TTS schedule - Patient to sit in chair during dialysis - next treatment planned for Saturday - plan for rehab placement for 30 d in [place] and get dialysis at local unit  #. Anemia of chronic kidney disease:  Status post PRBC transfusion on 8/22.  - EPO with HD treatment.  Lab Results  Component Value Date   HGB 9.7 (L) 12/28/2018  Hgb staying stable EPO with HD    #. Secondary Hyperparathyroidism:  not currently on binders.  Lab Results  Component Value Date   PTH 66 (H) 07/31/2018   CALCIUM 7.8 (L) 12/24/2018   PHOS 5.2 (H) 12/24/2018      #. Diabetes mellitus type II with chronic kidney disease: history of poor control.  Lab Results  Component Value Date   HGBA1C 7.5 (H) 12/23/2018  currently sliding scale   #. 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 Dr. Lucky Cowboy.   -  off Abx - Appreciate vascular, podiatry and infectious disease input.  - Plan for outpatient rehab   LOS: [place] 8/28/202010:47 AM

## 2018-12-30 NOTE — Progress Notes (Signed)
ID Pt doing well Stump healing well Off antibiotics X 5 days ID will sign off

## 2018-12-30 NOTE — TOC Progression Note (Signed)
Transition of Care (TOC) - Progression Note    Patient Details  Name: Paul [name]. MRN: [id number] Date of Birth: 1965-07-27  Transition of Care [facility]) CM/SW Contact  [name], RN Phone Number: 12/30/2018, 11:28 AM  Clinical Narrative:    Damaris Schooner with [name] from Dialysis coordinator and she tells me to get his dialysis switched the the [place] facility he will have to have a PPD, It has been ordered but can't be read for 72 hours so the earliest he could DC is Monday   Expected Discharge Plan: [place]    Expected Discharge Plan and Services Expected Discharge Plan: [place] Choice: [place] arrangements for the past 2 months: Single Family Home Expected Discharge Date: 12/23/18                                     Social Determinants of Health (SDOH) Interventions    Readmission Risk Interventions Readmission Risk Prevention Plan 11/25/2018 10/06/2018 09/09/2018  Transportation Screening Complete Complete Complete  PCP or Specialist Appt within 3-5 Days Complete Complete Complete  HRI or Home Care Consult Complete Complete Complete  Social Work Consult for [place] Planning/Counseling Complete Complete Not Complete  SW consult not completed comments - - NA  Palliative Care Screening Not Applicable Not Applicable Not Applicable  Medication Review (RN Care Manager) Complete - -  Some recent data might be hidden

## 2018-12-30 NOTE — Progress Notes (Signed)
[facility] NAME: Paul [name]    MR#:  [id number]  DATE OF BIRTH:  [birth year]  SUBJECTIVE:  CHIEF COMPLAINT:   Chief Complaint  Patient presents with  . Wound Check   Came with foot infection. 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  Pain is well controlled  REVIEW OF SYSTEMS:  CONSTITUTIONAL: No fever, fatigue or weakness.  EYES: No blurred or double vision.  EARS, NOSE, AND THROAT: No tinnitus or ear pain.  RESPIRATORY: No cough, shortness of breath, wheezing or hemoptysis.  CARDIOVASCULAR: No chest pain, orthopnea, edema.  GASTROINTESTINAL: No nausea, vomiting, diarrhea or abdominal pain.  GENITOURINARY: No dysuria, hematuria.  ENDOCRINE: No polyuria, nocturia,  HEMATOLOGY: No anemia, easy bruising or bleeding SKIN: No rash or lesion. MUSCULOSKELETAL: No joint pain or arthritis.   NEUROLOGIC: No tingling, numbness, weakness.  PSYCHIATRY: No anxiety or depression.  DRUG ALLERGIES:   Allergies  Allergen Reactions  . Other Other (See Comments)    Cardiac Problems. Pt states he tolerates Toradol. Due to kidney and heart problems per pt  . Ibuprofen Other (See Comments)    Heart problems  . Baclofen Other (See Comments)  . Metformin Diarrhea  . Nsaids     Due to kidney and heart problems per pt    VITALS:  Blood pressure (!) 141/80, pulse 87, temperature 97.8 F (36.6 C), temperature source Oral, resp. rate 18, height 5\' 9"  (1.753 m), weight 71.3 kg, SpO2 99 %.  PHYSICAL EXAMINATION:   GENERAL:  53 y.o.-year-old patient lying in the bed with no acute distress.  EYES: Pupils equal, round, reactive to light and accommodation. No scleral icterus. Extraocular muscles intact.  HEENT: Head atraumatic, normocephalic. Oropharynx and nasopharynx clear.  NECK:  Supple, no jugular venous distention. No thyroid enlargement, no tenderness.  LUNGS: Normal breath sounds bilaterally, no wheezing, rales,rhonchi or crepitation. No use of accessory  muscles of respiration.  CARDIOVASCULAR: S1, S2 normal. No murmurs, rubs, or gallops.  ABDOMEN: Soft, nontender, nondistended. Bowel sounds present. No organomegaly or mass.  EXTREMITIES: No pedal edema, cyanosis, or clubbing. right BKA NEUROLOGIC: Cranial nerves II through XII are intact. Muscle strength 5/5 in all extremities. Sensation intact. Gait not checked.  PSYCHIATRIC: The patient is alert and oriented x 3.  SKIN: No obvious rash, lesion, or ulcer.   Physical Exam LABORATORY PANEL:   CBC Recent Labs  Lab 12/28/18 0746  WBC 13.3*  HGB 9.7*  HCT 32.1*  PLT 384   ------------------------------------------------------------------------------------------------------------------  Chemistries  Recent Labs  Lab 12/24/18 0701 12/28/18 0746  NA 134*  --   K 5.7* 4.7  CL 100  --   CO2 24  --   GLUCOSE 185*  --   BUN 40*  --   CREATININE 7.88*  --   CALCIUM 7.8*  --    ------------------------------------------------------------------------------------------------------------------  Cardiac Enzymes No results for input(s): TROPONINI in the last 168 hours. ------------------------------------------------------------------------------------------------------------------  RADIOLOGY:  No results found.  ASSESSMENT AND PLAN:   Active Problems:   Right foot infection  1. 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  Podiatry has discussed below-knee amputation. we will continue same antibiotics as he was doing with dialysis. BKA done 12/23/18 Antibiotics stopped 3 days after BKA 2. End-stage renal disease on hemodialysis Tuesday Thursday and Saturday.   Nephrology following 3. Paroxysmal atrial fibrillation.  On amiodarone, aspirin, Plavix 4. Chronic systolic congestive heart failure. Dialysis to manage fluid 5. Gout bilateral hands allopurinol and oral prednisone 5 mg  daily 6. Type 2 diabetes mellitus on diet control only. 7. Chronic loose stools continue same  home oral regimen 8. Fall in room    negative CT head.  9. Anemia of ESRD- transfused 1 unit PRBC on 12/27/2018  COVID ordered. Waiting for HD to be setup. SNF in 1-2 days  All the records are reviewed and case discussed with Care Management/Social Worker Management plans discussed with the patient, family and they are in agreement.  CODE STATUS: Full  TOTAL TIME TAKING CARE OF THIS PATIENT: 35 minutes  Neita Carp M.D on 12/30/2018   Between 7am to 6pm - Pager - [phone]  After 6pm go to [email or web address] - password [email or web address] [facility]  Office  [phone]  CC: Primary care physician; Tracie Harrier, MD  Note: This dictation was prepared with Dragon dictation along with smaller phrase technology. Any transcriptional errors that result from this process are unintentional.

## 2018-12-30 NOTE — Progress Notes (Signed)
Received notice yesterday that this patient is going to Rehab in [facility] and need a chair time at one of the [facility] centers. Referral was sent yesterday, center requested a PPD for admission. Dr. Candiss Norse notified and placed order yesterday. Waiting on clinic/physican acceptance.

## 2018-12-30 NOTE — Progress Notes (Signed)
Paul [name]   Subjective: 7 Days Post-Op: Right below-the-knee amputation  Patient without complaint this afternoon.  Sitting at the side of his bed eating comfortably.  Objective: Vitals:   12/29/18 1231 12/29/18 1300 12/30/18 0024 12/30/18 0818  BP: 129/75 (!) 149/82 134/75 (!) 141/80  Pulse: 81 87 80 87  Resp: 18 18 16 18   Temp: 98 F (36.7 C) 98 F (36.7 C) 97.9 F (36.6 C) 97.8 F (36.6 C)  TempSrc: Oral  Oral Oral  SpO2: 100% 100% 98% 99%  Weight: 71.3 kg     Height:        Intake/Output Summary (Last 24 hours) at 12/30/2018 1420 Last data filed at 12/30/2018 0900 Gross per 24 hour  Intake 300 ml  Output -  Net 300 ml   Physical Exam: A&Ox3, NAD CV: RRR Pulmonary: CTA Bilaterally Abdomen: Soft, Nontender, Nondistended Vascular:             Right Lower Extremity: Sutures/staples are intact clean and dry.  The stump is warm and well perfused.  Stump is healing well.  Knee is flexible at the joint.     Laboratory: CBC    Component Value Date/Time   WBC 13.3 (H) 12/28/2018 0746   HGB 9.7 (L) 12/28/2018 0746   HCT 32.1 (L) 12/28/2018 0746   PLT 384 12/28/2018 0746   BMET    Component Value Date/Time   NA 134 (L) 12/24/2018 0701   NA 139 06/23/2016 1553   K 4.7 12/28/2018 0746   CL 100 12/24/2018 0701   CO2 24 12/24/2018 0701   GLUCOSE 185 (H) 12/24/2018 0701   BUN 40 (H) 12/24/2018 0701   BUN 46 (H) 06/23/2016 1553   CREATININE 7.88 (H) 12/24/2018 0701   CALCIUM 7.8 (L) 12/24/2018 0701   GFRNONAA 7 (L) 12/24/2018 0701   GFRAA 8 (L) 12/24/2018 0701   Assessment/Planning: The patient is a 53 year old male with multiple medical issues including 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 2) social work has successfully obtained financial assistance for the patient to be able to be discharged to rehab for 30 days. 3) discharge  instructions/follow-up of been placed in the patient's chart 4) no further recommendations from vascular surgery team at this time 5) vascular to sign off please reconsult if needed  Discussed with Dr. Ellis Parents  PA-C 12/30/2018 2:20 PM

## 2018-12-30 NOTE — Progress Notes (Signed)
Physical Therapy Treatment Patient Details Name: Paul [name]. MRN: [id number] DOB: Oct 01, 1965 Today's Date: 12/30/2018    History of Present Illness 53 y/o male here for non-healing wound to R heel, now referred to PT for his new R BK amputation performed on 12/23/18.  PMHx:  osteomyelitis, ESRD, PAF, CHF, DM, gout, GI symptoms      PT Comments    Pt showed great effort and willingness to participate with PT session. He was able to far surpass his longest bout of ambulation (~30ft) with slow but consistent hopping effort - 2 standing rest breaks and c/o significant b/l UE fatigue from use of walker. Pt very motivated with LE exercises and did not c/o significant pain with exercises.  Pt remains functionally limited, but ultimately doing well and making nice gains with PT activities.   Follow Up Recommendations  SNF     Equipment Recommendations  Rolling walker with 5" wheels;3in1 (PT)    Recommendations for Other Services       Precautions / Restrictions Precautions Precautions: Fall Restrictions RLE Weight Bearing: Non weight bearing    Mobility  Bed Mobility Overal bed mobility: Modified Independent             General bed mobility comments: Pt able to get himself up to sitting w/o issue or hesitation  Transfers Overall transfer level: Needs assistance Equipment used: Rolling walker (2 wheeled) Transfers: Sit to/from Stand Sit to Stand: Supervision         General transfer comment: good use of UE for rising and lowering safely during transfers  Ambulation/Gait Ambulation/Gait assistance: Supervision Gait Distance (Feet): 55 Feet Assistive device: Rolling walker (2 wheeled)       General Gait Details: Pt needed 2 standing rest breaks, but actually did very well with the effort.  He was consistent with relatively small hop, and endorsed fatigue in UEs with the effort but O2 remained in the high 90s and though HR went up it did not breach  100bpm.   Stairs             Wheelchair Mobility    Modified Rankin (Stroke Patients Only)       Balance Overall balance assessment: Needs assistance   Sitting balance-Leahy Scale: Good     Standing balance support: Bilateral upper extremity supported Standing balance-Leahy Scale: Fair Standing balance comment: reliant on walker, no overt LOBs or unsteadiness but cautious and quick to fatigue in UEs with mobility                            Cognition Arousal/Alertness: Awake/alert Behavior During Therapy: WFL for tasks assessed/performed;Impulsive Overall Cognitive Status: Within Functional Limits for tasks assessed                                        Exercises General Exercises - Lower Extremity Quad Sets: Strengthening;15 reps Short Arc Quad: Strengthening;20 reps Hip ABduction/ADduction: Strengthening;15 reps Straight Leg Raises: AROM;15 reps    General Comments        Pertinent Vitals/Pain Pain Assessment: 0-10 Pain Score: 4     Home Living                      Prior Function            PT Goals (current goals can now be found  in the care plan section) Progress towards PT goals: Progressing toward goals    Frequency    7X/week      PT Plan Current plan remains appropriate    Co-evaluation              AM-PAC PT "6 Clicks" Mobility   Outcome Measure  Help needed turning from your back to your side while in a flat bed without using bedrails?: None Help needed moving from lying on your back to sitting on the side of a flat bed without using bedrails?: None Help needed moving to and from a bed to a chair (including a wheelchair)?: A Little Help needed standing up from a chair using your arms (e.g., wheelchair or bedside chair)?: A Little Help needed to walk in hospital room?: A Lot Help needed climbing 3-5 steps with a railing? : Total 6 Click Score: 17    End of Session Equipment Utilized  During Treatment: Gait belt Activity Tolerance: Patient tolerated treatment well Patient left: in chair;with call bell/phone within reach Nurse Communication: Mobility status PT Visit Diagnosis: Unsteadiness on feet (R26.81);Muscle weakness (generalized) (M62.81)     Time: 2527-1292 PT Time Calculation (min) (ACUTE ONLY): 27 min  Charges:  $Gait Training: 8-22 mins $Therapeutic Exercise: 8-22 mins                     Kreg Shropshire, DPT 12/30/2018, 11:10 AM

## 2018-12-31 LAB — RENAL FUNCTION PANEL
Albumin: 2.7 g/dL — ABNORMAL LOW (ref 3.5–5.0)
Anion gap: 9 (ref 5–15)
BUN: 36 mg/dL — ABNORMAL HIGH (ref 6–20)
CO2: 27 mmol/L (ref 22–32)
Calcium: 8.3 mg/dL — ABNORMAL LOW (ref 8.9–10.3)
Chloride: 103 mmol/L (ref 98–111)
Creatinine, Ser: 4.23 mg/dL — ABNORMAL HIGH (ref 0.61–1.24)
GFR calc Af Amer: 17 mL/min — ABNORMAL LOW (ref >60)
GFR calc non Af Amer: 15 mL/min — ABNORMAL LOW (ref >60)
Glucose, Bld: 119 mg/dL — ABNORMAL HIGH (ref 70–99)
Phosphorus: 2.1 mg/dL — ABNORMAL LOW (ref 2.5–4.6)
Potassium: 3.9 mmol/L (ref 3.5–5.1)
Sodium: 139 mmol/L (ref 135–145)

## 2018-12-31 LAB — CBC
HCT: 27.5 % — ABNORMAL LOW (ref 39.0–52.0)
Hemoglobin: 8.2 g/dL — ABNORMAL LOW (ref 13.0–17.0)
MCH: 28.3 pg (ref 26.0–34.0)
MCHC: 29.8 g/dL — ABNORMAL LOW (ref 30.0–36.0)
MCV: 94.8 fL (ref 80.0–100.0)
Platelets: 372 10*3/uL (ref 150–400)
RBC: 2.9 MIL/uL — ABNORMAL LOW (ref 4.22–5.81)
RDW: 18 % — ABNORMAL HIGH (ref 11.5–15.5)
WBC: 11.7 10*3/uL — ABNORMAL HIGH (ref 4.0–10.5)
nRBC: 0 % (ref 0.0–0.2)

## 2018-12-31 LAB — GLUCOSE, CAPILLARY
Glucose-Capillary: 148 mg/dL — ABNORMAL HIGH (ref 70–99)
Glucose-Capillary: 106 mg/dL — ABNORMAL HIGH (ref 70–99)
Glucose-Capillary: 135 mg/dL — ABNORMAL HIGH (ref 70–99)
Glucose-Capillary: 246 mg/dL — ABNORMAL HIGH (ref 70–99)

## 2018-12-31 NOTE — Progress Notes (Signed)
[facility] NAME: Adair Lauderback    MR#:  [id number]  DATE OF BIRTH:  Oct 30, 1965  SUBJECTIVE:   patient without issues doing well waiting for SNF placement going to HD today  REVIEW OF SYSTEMS:    Review of Systems  Constitutional: Negative for fever, chills weight loss HENT: Negative for ear pain, nosebleeds, congestion, facial swelling, rhinorrhea, neck pain, neck stiffness and ear discharge.   Respiratory: Negative for cough, shortness of breath, wheezing  Cardiovascular: Negative for chest pain, palpitations and leg swelling.  Gastrointestinal: Negative for heartburn, abdominal pain, vomiting, diarrhea or consitpation Genitourinary: Negative for dysuria, urgency, frequency, hematuria Musculoskeletal: Negative for back pain or joint pain Neurological: Negative for dizziness, seizures, syncope, focal weakness,  numbness and headaches.  Hematological: Does not bruise/bleed easily.  Psychiatric/Behavioral: Negative for hallucinations, confusion, dysphoric mood    Tolerating Diet: yes      DRUG ALLERGIES:   Allergies  Allergen Reactions  . Other Other (See Comments)    Cardiac Problems. Pt states he tolerates Toradol. Due to kidney and heart problems per pt  . Ibuprofen Other (See Comments)    Heart problems  . Baclofen Other (See Comments)  . Metformin Diarrhea  . Nsaids     Due to kidney and heart problems per pt    VITALS:  Blood pressure 113/70, pulse 79, temperature 98.7 F (37.1 C), temperature source Oral, resp. rate 19, height 5\' 9"  (1.753 m), weight 71.3 kg, SpO2 98 %.  PHYSICAL EXAMINATION:  Constitutional: Appears well-developed and well-nourished. No distress. HENT: Normocephalic. Marland Kitchen Oropharynx is clear and moist.  Eyes: Conjunctivae and EOM are normal. PERRLA, no scleral icterus.  Neck: Normal ROM. Neck supple. No JVD. No tracheal deviation. CVS: RRR, S1/S2 +, no murmurs, no gallops, no carotid bruit.   Pulmonary: Effort and breath sounds normal, no stridor, rhonchi, wheezes, rales.  Abdominal: Soft. BS +,  no distension, tenderness, rebound or guarding.  Musculoskeletal: Normal range of motion. No edema and no tenderness.  Neuro: Alert. CN 2-12 grossly intact. No focal deficits. Skin:  Right BKA stump dressing intact Psychiatric: Normal mood and affect.      LABORATORY PANEL:   CBC Recent Labs  Lab 12/31/18 0952  WBC 11.7*  HGB 8.2*  HCT 27.5*  PLT 372   ------------------------------------------------------------------------------------------------------------------  Chemistries  Recent Labs  Lab 12/31/18 1051  NA 139  K 3.9  CL 103  CO2 27  GLUCOSE 119*  BUN 36*  CREATININE 4.23*  CALCIUM 8.3*   ------------------------------------------------------------------------------------------------------------------  Cardiac Enzymes No results for input(s): TROPONINI in the last 168 hours. ------------------------------------------------------------------------------------------------------------------  RADIOLOGY:  No results found.   ASSESSMENT AND PLAN:   53 year old male with end-stage renal disease on hemodialysis, PAF and chronic systolic heart failure who presented to the emergency room due to concern of right heel osteomyelitis.  1.  Right heel osteomyelitis: Patient is postoperative day #8 right below-knee amputation. All antibiotics have been discontinued Continue management as per surgery  2.  End-stage renal disease on hemodialysis: Dialysis as per nephrology  3.  PAF currently normal sinus rhythm: Continue amiodarone and Coreg for heart rate control  4.  Chronic systolic heart failure without signs of exacerbation  5.  Diabetes type 2 on ADA diet  6.  Acute on chronic anemia of end-stage renal disease status post 1 unit PRBC on August 25. Continue ferrous sulfate COVID testing negative  Skilled nursing facility placement  Monday  Management plans discussed with the patient and he is in agreement.  CODE STATUS: full  TOTAL TIME TAKING CARE OF THIS PATIENT: 25 minutes.     POSSIBLE D/C monday, DEPENDING ON CLINICAL CONDITION.   Bettey Costa M.D on 12/31/2018 at 12:16 PM  Between 7am to 6pm - Pager - [phone] After 6pm go to [email or web address] - password [email or web address]  Office  [phone]  CC: Primary care physician; Tracie Harrier, MD  Note: This dictation was prepared with Dragon dictation along with smaller phrase technology. Any transcriptional errors that result from this process are unintentional.

## 2018-12-31 NOTE — Progress Notes (Signed)
Pre dialysis assessment 

## 2018-12-31 NOTE — Progress Notes (Signed)
Paul [name].  MRN: [id number]  DOB/AGE: [birth year] 53 y.o.  Primary Care Physician:Hande, Cherlyn Labella, MD  Admit date: 12/21/2018  Chief Complaint:  Chief Complaint  Patient presents with  . Wound Check    S-Pt presented on  12/21/2018 with  Chief Complaint  Patient presents with  . Wound Check  .   Pt seen on HD. Pt today feels better, offers no new concerns.  Meds  . allopurinol  100 mg Oral Daily  . amiodarone  100 mg Oral Daily  . aspirin  81 mg Oral Daily  . calcium acetate  1,334 mg Oral TID WC  . carvedilol  3.125 mg Oral BID  . Chlorhexidine Gluconate Cloth  6 each Topical Q0600  . clopidogrel  75 mg Oral Daily  . dicyclomine  10 mg Oral TID AC & HS  . epoetin (EPOGEN/PROCRIT) injection  4,000 Units Intravenous Q T,Th,Sa-HD  . ferrous sulfate  325 mg Oral BID WC  . gabapentin  100 mg Oral TID  . heparin  5,000 Units Subcutaneous Q8H  . insulin aspart  0-5 Units Subcutaneous QHS  . insulin aspart  0-9 Units Subcutaneous TID WC  . predniSONE  5 mg Oral Q breakfast  . sodium chloride flush  3 mL Intravenous Q12H  . tuberculin  5 Units Intradermal Once         DGL:OVFIE from the symptoms mentioned above,there are no other symptoms referable to all systems reviewed.  Physical Exam: Vital signs in last 24 hours: Temp:  [97.4 F (36.3 C)-97.8 F (36.6 C)] 97.4 F (36.3 C) (08/28 2306) Pulse Rate:  [78-87] 78 (08/28 2306) Resp:  [15-18] 15 (08/28 2306) BP: (115-141)/(73-80) 120/73 (08/28 2306) SpO2:  [98 %-100 %] 100 % (08/28 2306) Weight change:  Last BM Date: 12/30/18  Intake/Output from previous day: 08/28 0701 - 08/29 0700 In: 300 [P.O.:300] Out: -  No intake/output data recorded.   Physical Exam: General- pt is awake,alert, oriented to time place and person Resp- No acute REsp distress, CTA B/L NO Rhonchi CVS- S1S2 regular in rate and rhythm GIT- BS+, soft, NT, ND EXT- NO LE Edema, Cyanosis. Right BKA Access- Right IJ permacath in  stu  Lab Results: CBC Recent Labs    12/28/18 0746  WBC 13.3*  HGB 9.7*  HCT 32.1*  PLT 384    BMET Recent Labs    12/28/18 0746  K 4.7    MICRO Recent Results (from the past 240 hour(s))  SARS Coronavirus 2 [facility] order, Performed in [facility] hospital lab) Nasopharyngeal Nasopharyngeal Swab     Status: None   Collection Time: 12/21/18  5:14 PM   Specimen: Nasopharyngeal Swab  Result Value Ref Range Status   SARS Coronavirus 2 NEGATIVE NEGATIVE Final    Comment: (NOTE) If result is NEGATIVE SARS-CoV-2 target nucleic acids are NOT DETECTED. The SARS-CoV-2 RNA is generally detectable in upper and lower  respiratory specimens during the acute phase of infection. The lowest  concentration of SARS-CoV-2 viral copies this assay can detect is 250  copies / mL. A negative result does not preclude SARS-CoV-2 infection  and should not be used as the sole basis for treatment or other  patient management decisions.  A negative result may occur with  improper specimen collection / handling, submission of specimen other  than nasopharyngeal swab, presence of viral mutation(s) within the  areas targeted by this assay, and inadequate number of viral copies  (<250 copies / mL). A negative result  must be combined with clinical  observations, patient history, and epidemiological information. If result is POSITIVE SARS-CoV-2 target nucleic acids are DETECTED. The SARS-CoV-2 RNA is generally detectable in upper and lower  respiratory specimens dur ing the acute phase of infection.  Positive  results are indicative of active infection with SARS-CoV-2.  Clinical  correlation with patient history and other diagnostic information is  necessary to determine patient infection status.  Positive results do  not rule out bacterial infection or co-infection with other viruses. If result is PRESUMPTIVE POSTIVE SARS-CoV-2 nucleic acids MAY BE PRESENT.   A presumptive positive result was  obtained on the submitted specimen  and confirmed on repeat testing.  While 2019 novel coronavirus  (SARS-CoV-2) nucleic acids may be present in the submitted sample  additional confirmatory testing may be necessary for epidemiological  and / or clinical management purposes  to differentiate between  SARS-CoV-2 and other Sarbecovirus currently known to infect humans.  If clinically indicated additional testing with an alternate test  methodology [phone]) is advised. The SARS-CoV-2 RNA is generally  detectable in upper and lower respiratory sp ecimens during the acute  phase of infection. The expected result is Negative. Fact Sheet for Patients:  StrictlyIdeas.no Fact Sheet for Healthcare Providers: BankingDealers.co.za This test is not yet approved or cleared by the [place] FDA and has been authorized for detection and/or diagnosis of SARS-CoV-2 by FDA under an Emergency Use Authorization (EUA).  This EUA will remain in effect (meaning this test can be used) for the duration of the COVID-19 declaration under Section 564(b)(1) of the Act, 21 U.S.C. section 360bbb-3(b)(1), unless the authorization is terminated or revoked sooner. Performed at [facility], [address] [place], [place] [address]   Aerobic/Anaerobic Culture (surgical/deep wound)     Status: None   Collection Time: 12/22/18  8:00 AM   Specimen: Wound  Result Value Ref Range Status   Specimen Description   Final    WOUND Performed at [facility], [address], [place], [place] [address]    Special Requests   Final    RIGHT FOOT Performed at [facility], [address] [place], [place] [address]    Gram Stain   Final    RARE WBC PRESENT, PREDOMINANTLY PMN FEW GRAM VARIABLE ROD    Culture   Final    ABUNDANT MULTIPLE ORGANISMS PRESENT, NONE PREDOMINANT NO ANAEROBES ISOLATED Performed at [facility], [place] [address] [place], [place] [address]    Report Status 12/27/2018 FINAL  Final  CULTURE, BLOOD (ROUTINE X 2) w Reflex to ID Panel     Status: None   Collection Time: 12/22/18  9:20 PM   Specimen: BLOOD  Result Value Ref Range Status   Specimen Description BLOOD LEFT ASSIST CONTROL  Final   Special Requests   Final    BOTTLES DRAWN AEROBIC AND ANAEROBIC Blood Culture adequate volume   Culture   Final    NO GROWTH 5 DAYS Performed at [facility], [address] [place], [place] [address]    Report Status 12/27/2018 FINAL  Final  CULTURE, BLOOD (ROUTINE X 2) w Reflex to ID Panel     Status: None   Collection Time: 12/22/18  9:20 PM   Specimen: BLOOD  Result Value Ref Range Status   Specimen Description BLOOD RIGHT ARM  Final   Special Requests   Final    BOTTLES DRAWN AEROBIC AND ANAEROBIC Blood Culture adequate volume  Culture   Final    NO GROWTH 5 DAYS Performed at [facility], [address] [place], [place] [address]    Report Status 12/27/2018 FINAL  Final  MRSA PCR Screening     Status: None   Collection Time: 12/23/18 11:01 AM   Specimen: Nasopharyngeal  Result Value Ref Range Status   MRSA by PCR NEGATIVE NEGATIVE Final    Comment:        The GeneXpert MRSA Assay (FDA approved for NASAL specimens only), is one component of a comprehensive MRSA colonization surveillance program. It is not intended to diagnose MRSA infection nor to guide or monitor treatment for MRSA infections. Performed at [facility], [place], [place] [address]   SARS CORONAVIRUS 2 (TAT 6-12 HRS) Nasal Swab Aptima Multi Swab     Status: None   Collection Time: 12/29/18 10:41 AM   Specimen: Aptima Multi Swab; Nasal Swab  Result Value Ref Range Status   SARS Coronavirus 2 NEGATIVE NEGATIVE Final    Comment: (NOTE) SARS-CoV-2 target nucleic acids are NOT DETECTED. The SARS-CoV-2 RNA is generally detectable in upper and lower respiratory specimens during  the acute phase of infection. Negative results do not preclude SARS-CoV-2 infection, do not rule out co-infections with other pathogens, and should not be used as the sole basis for treatment or other patient management decisions. Negative results must be combined with clinical observations, patient history, and epidemiological information. The expected result is Negative. Fact Sheet for Patients: SugarRoll.be Fact Sheet for Healthcare Providers: https://www.woods-mathews.com/ This test is not yet approved or cleared by the [place] FDA and  has been authorized for detection and/or diagnosis of SARS-CoV-2 by FDA under an Emergency Use Authorization (EUA). This EUA will remain  in effect (meaning this test can be used) for the duration of the COVID-19 declaration under Section 56 4(b)(1) of the Act, 21 U.S.C. section 360bbb-3(b)(1), unless the authorization is terminated or revoked sooner. Performed at [facility], [place] [address] [place], [place] [address]       Lab Results  Component Value Date   PTH 2 (H) 07/31/2018   CALCIUM 7.8 (L) 12/24/2018   PHOS 5.2 (H) 12/24/2018               Impression: 1)Renal  ESRD on HD                Pt is on TTS schedule                Pt is being dialyzed today  2)HTN  Medication- On Alpha and beta Blockers   3)Anemia HGb at goal (9--11) On Epo during HD Pt did recive PRBC during this admission   4)CKD Mineral-Bone Disorder Secondary Hyperparathyroidism present. Phosphorus at goal.  on binders Calcium when corrected for albumin is at goal.  5)ID -admitted with Right heel osteomyelitis Now s/p  BKA   6)Electrolytes Normokalemic    7)Acid base Co2 at goal     Plan:  Will continue current care      [place] S 12/31/2018, 7:32 AM

## 2018-12-31 NOTE — Progress Notes (Signed)
Hd started  

## 2018-12-31 NOTE — Progress Notes (Signed)
Post dialysis assessment 

## 2018-12-31 NOTE — Progress Notes (Signed)
8 Days Post-Op   Subjective/Chief Complaint: Comfortable    Objective: Vital signs in last 24 hours: Temp:  [97.4 F (36.3 C)-98.7 F (37.1 C)] 98.7 F (37.1 C) (08/29 0945) Pulse Rate:  [75-81] 77 (08/29 1145) Resp:  [15-18] 18 (08/29 1130) BP: (100-126)/(58-78) 113/70 (08/29 1145) SpO2:  [98 %-100 %] 98 % (08/29 0945) Last BM Date: 12/30/18  Intake/Output from previous day: 08/28 0701 - 08/29 0700 In: 300 [P.O.:300] Out: -  Intake/Output this shift: No intake/output data recorded.  General appearance: alert, cooperative and appears stated age Head: Normocephalic, without obvious abnormality, atraumatic Neck: no adenopathy, no carotid bruit, no JVD, supple, symmetrical, trachea midline and thyroid not enlarged, symmetric, no tenderness/mass/nodules Back: symmetric, no curvature. ROM normal. No CVA tenderness. Resp: clear to auscultation bilaterally Extremities: Right BKA stump dressing intact Incision/Wound:  Lab Results:  Recent Labs    12/31/18 0952  WBC 11.7*  HGB 8.2*  HCT 27.5*  PLT 372   BMET Recent Labs    12/31/18 1051  NA 139  K 3.9  CL 103  CO2 27  GLUCOSE 119*  BUN 36*  CREATININE 4.23*  CALCIUM 8.3*   PT/INR No results for input(s): LABPROT, INR in the last 72 hours. ABG No results for input(s): PHART, HCO3 in the last 72 hours.  Invalid input(s): PCO2, PO2  Studies/Results: No results found.  Anti-infectives: Anti-infectives (From admission, onward)   Start     Dose/Rate Route Frequency Ordered Stop   12/24/18 1800  DAPTOmycin (CUBICIN) 1,000 mg in sodium chloride 0.9 % IVPB  Status:  Discontinued     1,000 mg 240 mL/hr over 30 Minutes Intravenous Every Sat (Hemodialysis) 12/21/18 2037 12/26/18 1456   12/24/18 1200  cefTAZidime (FORTAZ) 2 g in sodium chloride 0.9 % 100 mL IVPB  Status:  Discontinued     2 g 200 mL/hr over 30 Minutes Intravenous Every Sat (Hemodialysis) 12/21/18 2032 12/26/18 1456   12/22/18 2100  metroNIDAZOLE  (FLAGYL) IVPB 500 mg  Status:  Discontinued     500 mg 100 mL/hr over 60 Minutes Intravenous Every 12 hours 12/22/18 2041 12/25/18 1401   12/22/18 1800  cefTAZidime (FORTAZ) 1 g in sodium chloride 0.9 % 100 mL IVPB  Status:  Discontinued     1 g 200 mL/hr over 30 Minutes Intravenous Once per day on Tue Thu 12/21/18 2029 12/21/18 2033   12/22/18 1800  cefTAZidime (FORTAZ) 1 g in sodium chloride 0.9 % 100 mL IVPB  Status:  Discontinued     1 g 200 mL/hr over 30 Minutes Intravenous Once per day on Tue Thu 12/21/18 2032 12/25/18 1401   12/22/18 1800  DAPTOmycin (CUBICIN) 750 mg in sodium chloride 0.9 % IVPB  Status:  Discontinued     750 mg 230 mL/hr over 30 Minutes Intravenous Once per day on Tue Thu 12/21/18 2037 12/25/18 1401   12/22/18 1200  cefTAZidime (FORTAZ) IVPB  Status:  Discontinued    Note to Pharmacy: Give 1gm with HD on Tuesdays and Thursdays and 2gm on Saturdays Indication:  Osteomyelitis Last Day of Therapy:  12/28/2018 Labs - Once weekly: CK, CBC/D and CMP,     1-2 g Intravenous Every T-Th-Sa (Hemodialysis) 12/21/18 2024 12/21/18 2028   12/22/18 1200  daptomycin (CUBICIN) IVPB  Status:  Discontinued    Note to Pharmacy: Give 750mg  after HD on Tuesdays and Thursdays and on Saturdays give 1000mg  Indication:  Osteomyelitis Last Day of Therapy:  12/28/2018 Labs - Once weekly:  CBC/D, CMP,  and CPK     750-1,000 mg Intravenous Every T-Th-Sa (Hemodialysis) 12/21/18 2024 12/21/18 2034   12/22/18 1200  cefTAZidime (FORTAZ) IVPB  Status:  Discontinued    Note to Pharmacy: Give 1gm with HD on Tuesdays and Thursdays and 2gm on Saturdays Indication:  Osteomyelitis Last Day of Therapy:  12/28/2018 Labs - Once weekly: CK, CBC/D and CMP,     1-2 g Intravenous Every T-Th-Sa (Hemodialysis) 12/21/18 2028 12/21/18 2028      Assessment/Plan: s/p Procedure(s): AMPUTATION BELOW KNEE (Right) (Right) Continue with local wound care.  PT/OT  LOS: 10 days    Elmore Guise 12/31/2018

## 2018-12-31 NOTE — Progress Notes (Signed)
Hd completed 

## 2018-12-31 NOTE — Progress Notes (Signed)
PT Cancellation Note  Patient Details Name: Paul [name]. MRN: [id number] DOB: 12-30-1965   Cancelled Treatment:    Reason Eval/Treat Not Completed: Patient at procedure or test/unavailable   Pt remains at dialysis this morning.  Will reschedule this afternoon as time allows but anticipate session resuming tomorrow.   Chesley Noon 12/31/2018, 1:14 PM

## 2019-01-01 LAB — GLUCOSE, CAPILLARY
Glucose-Capillary: 171 mg/dL — ABNORMAL HIGH (ref 70–99)
Glucose-Capillary: 167 mg/dL — ABNORMAL HIGH (ref 70–99)
Glucose-Capillary: 166 mg/dL — ABNORMAL HIGH (ref 70–99)
Glucose-Capillary: 202 mg/dL — ABNORMAL HIGH (ref 70–99)

## 2019-01-01 MED ORDER — CALCIUM ACETATE (PHOS BINDER) 667 MG PO CAPS
667.0000 mg | ORAL_CAPSULE | Freq: Three times a day (TID) | ORAL | Status: DC
  Administered 2019-01-01 – 2019-01-03 (×5): 667 mg via ORAL
  Filled 2019-01-01 (×7): qty 1

## 2019-01-01 NOTE — Progress Notes (Signed)
Physical Therapy Treatment Patient Details Name: Paul [name]. MRN: [id number] DOB: 07-10-1965 Today's Date: 01/01/2019    History of Present Illness 53 y/o male here for non-healing wound to R heel, now referred to PT for his new R BK amputation performed on 12/23/18.  PMHx:  osteomyelitis, ESRD, PAF, CHF, DM, gout, GI symptoms      PT Comments    Participated in exercises as described below in supine and standing.  Bed mobility without assist.  Stood with min guard.  Generally steady in standing and was able to hop-to gait pattern with RW 50'.  Initially gait with good step height and length but as he fatigues both decrease causing increased fall risk/stumbling.  Some decreased awareness and was cued by writer regarding increased fatigue which he acknowledged and sat in chair to rest.  Wheelchair mobility around unit and education regarding options for transfers.  Pt shown how to remove armrest to allow for squat pivot transfer to bed.  Able to transfer with min guard.  Stated at home he has 1 step up to the area of his home to allow access to bathroom.  Discussed options for bedside commode if needed.    SNF remains appropraite for pt upon discharge to increase functional independence and a safe discharge home.   Follow Up Recommendations  SNF     Equipment Recommendations  Rolling walker with 5" wheels;3in1 (PT)    Recommendations for Other Services       Precautions / Restrictions Precautions Precautions: Fall Precaution Comments: NWB on RLE Restrictions Weight Bearing Restrictions: Yes RLE Weight Bearing: Non weight bearing    Mobility  Bed Mobility Overal bed mobility: Modified Independent                Transfers Overall transfer level: Needs assistance Equipment used: Rolling walker (2 wheeled) Transfers: Sit to/from Stand Sit to Stand: Min guard            Ambulation/Gait Ambulation/Gait assistance: Min guard Gait Distance (Feet): 50  Feet Assistive device: Rolling walker (2 wheeled) Gait Pattern/deviations: Decreased stride length Gait velocity: decreased   General Gait Details: hop-to gait pattern,  initially good step height and length but fatigued with distance with decreasing height and length affecting safety with gait.   Stairs             Information systems manager mobility: Yes Wheelchair propulsion: Both upper extremities Wheelchair parts: Supervision/cueing Distance: 160' on unit with turns and navigating in Careers adviser Details (indicate cue type and reason): cues for how to remove armrest for transfer but overall indepenant with use once in chair  Modified Rankin (Stroke Patients Only)       Balance Overall balance assessment: Needs assistance Sitting-balance support: Feet supported Sitting balance-Leahy Scale: Good     Standing balance support: Bilateral upper extremity supported Standing balance-Leahy Scale: Fair Standing balance comment: reliant on walker, no overt LOBs or unsteadiness but cautious and quick to fatigue in UEs with mobility                            Cognition Arousal/Alertness: Awake/alert Behavior During Therapy: WFL for tasks assessed/performed Overall Cognitive Status: Within Functional Limits for tasks assessed  Exercises Other Exercises Other Exercises: supine 2 x 10 manually resisted ex for BLE- hip flex, ext, ab/add BLE, standing RLE flex/ext, hip knee flexion x 10    General Comments        Pertinent Vitals/Pain Pain Assessment: No/denies pain    Home Living                      Prior Function            PT Goals (current goals can now be found in the care plan section) Progress towards PT goals: Progressing toward goals    Frequency    7X/week      PT Plan Current plan remains appropriate    Co-evaluation               AM-PAC PT "6 Clicks" Mobility   Outcome Measure  Help needed turning from your back to your side while in a flat bed without using bedrails?: None Help needed moving from lying on your back to sitting on the side of a flat bed without using bedrails?: None Help needed moving to and from a bed to a chair (including a wheelchair)?: A Little Help needed standing up from a chair using your arms (e.g., wheelchair or bedside chair)?: A Little Help needed to walk in hospital room?: A Lot Help needed climbing 3-5 steps with a railing? : Total 6 Click Score: 17    End of Session Equipment Utilized During Treatment: Gait belt Activity Tolerance: Patient tolerated treatment well Patient left: in bed;with call bell/phone within reach;with bed alarm set         Time: 2952-8413 PT Time Calculation (min) (ACUTE ONLY): 24 min  Charges:  $Gait Training: 8-22 mins $Therapeutic Exercise: 8-22 mins                     Chesley Noon, PTA 01/01/19, 1:10 PM

## 2019-01-01 NOTE — Progress Notes (Signed)
Paul [name].  MRN: [id number]  DOB/AGE: 1965-06-15 53 y.o.  Primary Care Physician:Hande, Cherlyn Labella, MD  Admit date: 12/21/2018  Chief Complaint:  Chief Complaint  Patient presents with  . Wound Check    S-Pt presented on  12/21/2018 with  Chief Complaint  Patient presents with  . Wound Check  .    Pt offers no new concerns.  Meds  . allopurinol  100 mg Oral Daily  . amiodarone  100 mg Oral Daily  . aspirin  81 mg Oral Daily  . calcium acetate  1,334 mg Oral TID WC  . carvedilol  3.125 mg Oral BID  . Chlorhexidine Gluconate Cloth  6 each Topical Q0600  . clopidogrel  75 mg Oral Daily  . dicyclomine  10 mg Oral TID AC & HS  . ferrous sulfate  325 mg Oral BID WC  . gabapentin  100 mg Oral TID  . heparin  5,000 Units Subcutaneous Q8H  . insulin aspart  0-5 Units Subcutaneous QHS  . insulin aspart  0-9 Units Subcutaneous TID WC  . predniSONE  5 mg Oral Q breakfast  . sodium chloride flush  3 mL Intravenous Q12H  . tuberculin  5 Units Intradermal Once         HGD:JMEQA from the symptoms mentioned above,there are no other symptoms referable to all systems reviewed.  Physical Exam: Vital signs in last 24 hours: Temp:  [97.8 F (36.6 C)-98.9 F (37.2 C)] 97.8 F (36.6 C) (08/30 0745) Pulse Rate:  [79-92] 79 (08/30 0745) Resp:  [8-19] 18 (08/30 0745) BP: (99-141)/(63-85) 141/85 (08/30 0745) SpO2:  [99 %-100 %] 100 % (08/30 0745) Weight change:  Last BM Date: 12/31/18  Intake/Output from previous day: 08/29 0701 - 08/30 0700 In: 240 [P.O.:240] Out: 2000  No intake/output data recorded.   Physical Exam: General- pt is awake,alert, oriented to time place and person Resp- No acute REsp distress, CTA B/L NO Rhonchi CVS- S1S2 regular in rate and rhythm GIT- BS+, soft, NT, ND EXT- NO LE Edema, Cyanosis. Right BKA Access- Right IJ permacath in situ  Lab Results: CBC Recent Labs    12/31/18 0952  WBC 11.7*  HGB 8.2*  HCT 27.5*  PLT 372     BMET Recent Labs    12/31/18 1051  NA 139  K 3.9  CL 103  CO2 27  GLUCOSE 119*  BUN 36*  CREATININE 4.23*  CALCIUM 8.3*    MICRO Recent Results (from the past 240 hour(s))  CULTURE, BLOOD (ROUTINE X 2) w Reflex to ID Panel     Status: None   Collection Time: 12/22/18  9:20 PM   Specimen: BLOOD  Result Value Ref Range Status   Specimen Description BLOOD LEFT ASSIST CONTROL  Final   Special Requests   Final    BOTTLES DRAWN AEROBIC AND ANAEROBIC Blood Culture adequate volume   Culture   Final    NO GROWTH 5 DAYS Performed at [facility], [address] [place], [place] [address]    Report Status 12/27/2018 FINAL  Final  CULTURE, BLOOD (ROUTINE X 2) w Reflex to ID Panel     Status: None   Collection Time: 12/22/18  9:20 PM   Specimen: BLOOD  Result Value Ref Range Status   Specimen Description BLOOD RIGHT ARM  Final   Special Requests   Final    BOTTLES DRAWN AEROBIC AND ANAEROBIC Blood Culture adequate volume   Culture   Final  NO GROWTH 5 DAYS Performed at [facility], [address] [place], [place] [address]    Report Status 12/27/2018 FINAL  Final  MRSA PCR Screening     Status: None   Collection Time: 12/23/18 11:01 AM   Specimen: Nasopharyngeal  Result Value Ref Range Status   MRSA by PCR NEGATIVE NEGATIVE Final    Comment:        The GeneXpert MRSA Assay (FDA approved for NASAL specimens only), is one component of a comprehensive MRSA colonization surveillance program. It is not intended to diagnose MRSA infection nor to guide or monitor treatment for MRSA infections. Performed at [facility], [place], [place] [address]   SARS CORONAVIRUS 2 (TAT 6-12 HRS) Nasal Swab Aptima Multi Swab     Status: None   Collection Time: 12/29/18 10:41 AM   Specimen: Aptima Multi Swab; Nasal Swab  Result Value Ref Range Status   SARS Coronavirus 2 NEGATIVE NEGATIVE Final    Comment: (NOTE) SARS-CoV-2 target  nucleic acids are NOT DETECTED. The SARS-CoV-2 RNA is generally detectable in upper and lower respiratory specimens during the acute phase of infection. Negative results do not preclude SARS-CoV-2 infection, do not rule out co-infections with other pathogens, and should not be used as the sole basis for treatment or other patient management decisions. Negative results must be combined with clinical observations, patient history, and epidemiological information. The expected result is Negative. Fact Sheet for Patients: SugarRoll.be Fact Sheet for Healthcare Providers: https://www.woods-mathews.com/ This test is not yet approved or cleared by the [place] FDA and  has been authorized for detection and/or diagnosis of SARS-CoV-2 by FDA under an Emergency Use Authorization (EUA). This EUA will remain  in effect (meaning this test can be used) for the duration of the COVID-19 declaration under Section 56 4(b)(1) of the Act, 21 U.S.C. section 360bbb-3(b)(1), unless the authorization is terminated or revoked sooner. Performed at [facility], [place] [address], [place], [place] [address]       Lab Results  Component Value Date   PTH 44 (H) 07/31/2018   CALCIUM 8.3 (L) 12/31/2018   PHOS 2.1 (L) 12/31/2018               Impression: 1)Renal  ESRD on HD                Pt is on TTS schedule                Pt was dialyzed yesterday  2)HTN  Medication- On Alpha and beta Blockers   3)Anemia HGb at goal (9--11) On Epo during HD Pt did recive PRBC during this admission   4)CKD Mineral-Bone Disorder Secondary Hyperparathyroidism present. Phosphorus now at lower side.  on binders Calcium when corrected for albumin is at goal.  5)ID -admitted with Right heel osteomyelitis Now s/p  BKA   6)Electrolytes Normokalemic Normonatremic   7)Acid base Co2 at goal     Plan:  Will lower binders to cal acetate tab with  meals instead of 2 Will dialyze Tuesday     Vieno Tarrant S 01/01/2019, 12:20 PM

## 2019-01-01 NOTE — Progress Notes (Signed)
[facility] at [place] NAME: Paul [name]    MR#:  [id number]  DATE OF BIRTH:  [birth year]  SUBJECTIVE:  Did not sleep well last night due to the fact that patient next door was making noise all night no other issues.   REVIEW OF SYSTEMS:    Review of Systems  Constitutional: Negative for fever, chills weight loss HENT: Negative for ear pain, nosebleeds, congestion, facial swelling, rhinorrhea, neck pain, neck stiffness and ear discharge.   Respiratory: Negative for cough, shortness of breath, wheezing  Cardiovascular: Negative for chest pain, palpitations and leg swelling.  Gastrointestinal: Negative for heartburn, abdominal pain, vomiting, diarrhea or consitpation Genitourinary: Negative for dysuria, urgency, frequency, hematuria Musculoskeletal: Negative for back pain or joint pain Neurological: Negative for dizziness, seizures, syncope, focal weakness,  numbness and headaches.  Hematological: Does not bruise/bleed easily.  Psychiatric/Behavioral: Negative for hallucinations, confusion, dysphoric mood    Tolerating Diet: yes      DRUG ALLERGIES:   Allergies  Allergen Reactions  . Other Other (See Comments)    Cardiac Problems. Pt states he tolerates Toradol. Due to kidney and heart problems per pt  . Ibuprofen Other (See Comments)    Heart problems  . Baclofen Other (See Comments)  . Metformin Diarrhea  . Nsaids     Due to kidney and heart problems per pt    VITALS:  Blood pressure (!) 141/85, pulse 79, temperature 97.8 F (36.6 C), resp. rate 18, height 5\' 9"  (1.753 m), weight 71.3 kg, SpO2 100 %.  PHYSICAL EXAMINATION:  Constitutional: Appears well-developed and well-nourished. No distress. HENT: Normocephalic. Marland Kitchen Oropharynx is clear and moist.  Eyes: Conjunctivae and EOM are normal. PERRLA, no scleral icterus.  Neck: Normal ROM. Neck supple. No JVD. No tracheal deviation. CVS: RRR, S1/S2 +, no murmurs, no gallops, no  carotid bruit.  Pulmonary: Effort and breath sounds normal, no stridor, rhonchi, wheezes, rales.  Abdominal: Soft. BS +,  no distension, tenderness, rebound or guarding.  Musculoskeletal: Normal range of motion. No edema and no tenderness.  Neuro: Alert. CN 2-12 grossly intact. No focal deficits. Skin:  Right BKA stump dressing intact Psychiatric: Normal mood and affect.      LABORATORY PANEL:   CBC Recent Labs  Lab 12/31/18 0952  WBC 11.7*  HGB 8.2*  HCT 27.5*  PLT 372   ------------------------------------------------------------------------------------------------------------------  Chemistries  Recent Labs  Lab 12/31/18 1051  NA 139  K 3.9  CL 103  CO2 27  GLUCOSE 119*  BUN 36*  CREATININE 4.23*  CALCIUM 8.3*   ------------------------------------------------------------------------------------------------------------------  Cardiac Enzymes No results for input(s): TROPONINI in the last 168 hours. ------------------------------------------------------------------------------------------------------------------  RADIOLOGY:  No results found.   ASSESSMENT AND PLAN:   53 year old male with end-stage renal disease on hemodialysis, PAF and chronic systolic heart failure who presented to the emergency room due to concern of right heel osteomyelitis.  1.  Right heel osteomyelitis: Patient is postoperative day #8 right below-knee amputation. All antibiotics have been discontinued Continue management as per surgery  2.  End-stage renal disease on hemodialysis: Dialysis as per nephrology  3.  PAF currently normal sinus rhythm: Continue amiodarone and Coreg for heart rate control  4.  Chronic systolic heart failure without signs of exacerbation  5.  Diabetes type 2 on ADA diet  6.  Acute on chronic anemia of end-stage renal disease status post 1 unit PRBC on August 25. Continue ferrous sulfate COVID testing negative from 8/28  Skilled nursing facility  placement Monday      Management plans discussed with the patient and he is in agreement.  CODE STATUS: full  TOTAL TIME TAKING CARE OF THIS PATIENT: 25 minutes.     POSSIBLE D/C monday, DEPENDING ON CLINICAL CONDITION.   Bettey Costa M.D on 01/01/2019 at 10:21 AM  Between 7am to 6pm - Pager - [phone] After 6pm go to [email or web address] - password [email or web address] [facility]  Office  [phone]  CC: Primary care physician; Tracie Harrier, MD  Note: This dictation was prepared with Dragon dictation along with smaller phrase technology. Any transcriptional errors that result from this process are unintentional.

## 2019-01-01 NOTE — Progress Notes (Signed)
9 Days Post-Op   Subjective/Chief Complaint:Doing well, complains of minimal pain.  He states the dressing was changed and had little drainage.    Objective: Vital signs in last 24 hours: Temp:  [97.8 F (36.6 C)-98.9 F (37.2 C)] 97.8 F (36.6 C) (08/30 0745) Pulse Rate:  [79-92] 79 (08/30 0745) Resp:  [8-19] 18 (08/30 0745) BP: (99-141)/(63-85) 141/85 (08/30 0745) SpO2:  [99 %-100 %] 100 % (08/30 0745) Last BM Date: 12/31/18  Intake/Output from previous day: 08/29 0701 - 08/30 0700 In: 240 [P.O.:240] Out: 2000  Intake/Output this shift: No intake/output data recorded.  General appearance: alert, cooperative, appears stated age and no distress Head: Normocephalic, without obvious abnormality, atraumatic Neck: no adenopathy, no carotid bruit, no JVD, supple, symmetrical, trachea midline and thyroid not enlarged, symmetric, no tenderness/mass/nodules Resp: clear to auscultation bilaterally Chest wall: no tenderness Extremities: right stump dressing is clean and intact.  The left leg is unremarkable.  Incision/Wound:  Lab Results:  Recent Labs    12/31/18 0952  WBC 11.7*  HGB 8.2*  HCT 27.5*  PLT 372   BMET Recent Labs    12/31/18 1051  NA 139  K 3.9  CL 103  CO2 27  GLUCOSE 119*  BUN 36*  CREATININE 4.23*  CALCIUM 8.3*   PT/INR No results for input(s): LABPROT, INR in the last 72 hours. ABG No results for input(s): PHART, HCO3 in the last 72 hours.  Invalid input(s): PCO2, PO2  Studies/Results: No results found.  Anti-infectives: Anti-infectives (From admission, onward)   Start     Dose/Rate Route Frequency Ordered Stop   12/24/18 1800  DAPTOmycin (CUBICIN) 1,000 mg in sodium chloride 0.9 % IVPB  Status:  Discontinued     1,000 mg 240 mL/hr over 30 Minutes Intravenous Every Sat (Hemodialysis) 12/21/18 2037 12/26/18 1456   12/24/18 1200  cefTAZidime (FORTAZ) 2 g in sodium chloride 0.9 % 100 mL IVPB  Status:  Discontinued     2 g 200 mL/hr over 30  Minutes Intravenous Every Sat (Hemodialysis) 12/21/18 2032 12/26/18 1456   12/22/18 2100  metroNIDAZOLE (FLAGYL) IVPB 500 mg  Status:  Discontinued     500 mg 100 mL/hr over 60 Minutes Intravenous Every 12 hours 12/22/18 2041 12/25/18 1401   12/22/18 1800  cefTAZidime (FORTAZ) 1 g in sodium chloride 0.9 % 100 mL IVPB  Status:  Discontinued     1 g 200 mL/hr over 30 Minutes Intravenous Once per day on Tue Thu 12/21/18 2029 12/21/18 2033   12/22/18 1800  cefTAZidime (FORTAZ) 1 g in sodium chloride 0.9 % 100 mL IVPB  Status:  Discontinued     1 g 200 mL/hr over 30 Minutes Intravenous Once per day on Tue Thu 12/21/18 2032 12/25/18 1401   12/22/18 1800  DAPTOmycin (CUBICIN) 750 mg in sodium chloride 0.9 % IVPB  Status:  Discontinued     750 mg 230 mL/hr over 30 Minutes Intravenous Once per day on Tue Thu 12/21/18 2037 12/25/18 1401   12/22/18 1200  cefTAZidime (FORTAZ) IVPB  Status:  Discontinued    Note to Pharmacy: Give 1gm with HD on Tuesdays and Thursdays and 2gm on Saturdays Indication:  Osteomyelitis Last Day of Therapy:  12/28/2018 Labs - Once weekly: CK, CBC/D and CMP,     1-2 g Intravenous Every T-Th-Sa (Hemodialysis) 12/21/18 2024 12/21/18 2028   12/22/18 1200  daptomycin (CUBICIN) IVPB  Status:  Discontinued    Note to Pharmacy: Give 750mg  after HD on Tuesdays and Thursdays  and on Saturdays give 1000mg  Indication:  Osteomyelitis Last Day of Therapy:  12/28/2018 Labs - Once weekly:  CBC/D, CMP, and CPK     750-1,000 mg Intravenous Every T-Th-Sa (Hemodialysis) 12/21/18 2024 12/21/18 2034   12/22/18 1200  cefTAZidime (FORTAZ) IVPB  Status:  Discontinued    Note to Pharmacy: Give 1gm with HD on Tuesdays and Thursdays and 2gm on Saturdays Indication:  Osteomyelitis Last Day of Therapy:  12/28/2018 Labs - Once weekly: CK, CBC/D and CMP,     1-2 g Intravenous Every T-Th-Sa (Hemodialysis) 12/21/18 2028 12/21/18 2028      Assessment/Plan: s/p Procedure(s): AMPUTATION BELOW KNEE (Right)  (Right) Plan for discharge, continue with PT/OT  LOS: 11 days    Elmore Guise 01/01/2019

## 2019-01-02 DIAGNOSIS — N186 End stage renal disease: Secondary | ICD-10-CM | POA: Diagnosis not present

## 2019-01-02 DIAGNOSIS — Z992 Dependence on renal dialysis: Secondary | ICD-10-CM | POA: Diagnosis not present

## 2019-01-02 LAB — GLUCOSE, CAPILLARY
Glucose-Capillary: 96 mg/dL (ref 70–99)
Glucose-Capillary: 203 mg/dL — ABNORMAL HIGH (ref 70–99)
Glucose-Capillary: 175 mg/dL — ABNORMAL HIGH (ref 70–99)

## 2019-01-02 LAB — NOVEL CORONAVIRUS, NAA (HOSP ORDER, SEND-OUT TO REF LAB; TAT 18-24 HRS): SARS-CoV-2, NAA: NOT DETECTED

## 2019-01-02 MED ORDER — FERROUS SULFATE 325 (65 FE) MG PO TABS: 325.0000 mg | ORAL_TABLET | Freq: Two times a day (BID) | ORAL | 3 refills | Status: DC

## 2019-01-02 NOTE — TOC Progression Note (Signed)
Transition of Care (TOC) - Progression Note    Patient Details  Name: Paul [name]. MRN: [id number] Date of Birth: 1965-07-29  Transition of Care [facility]) CM/SW Contact  [name], RN Phone Number: 01/02/2019, 11:46 AM  Clinical Narrative:     Followed up with [name] the dialysis coordinator to inquire about the chair time, she is still awaiting to hear back from Dialysis center in [place] and will follow up with them now.  I touched base with [facility] and let them know the status, A new fax number for [facility] is [phone], relayed this information to [employer]   Expected Discharge Plan: [place]    Expected Discharge Plan and Services Expected Discharge Plan: [place] Choice: [place] Living arrangements for the past 2 months: Single Family Home Expected Discharge Date: 01/02/19                                     Social Determinants of Health (SDOH) Interventions    Readmission Risk Interventions Readmission Risk Prevention Plan 11/25/2018 10/06/2018 09/09/2018  Transportation Screening Complete Complete Complete  PCP or Specialist Appt within 3-5 Days Complete Complete Complete  HRI or Home Care Consult Complete Complete Complete  Social Work Consult for [place] Planning/Counseling Complete Complete Not Complete  SW consult not completed comments - - NA  Palliative Care Screening Not Applicable Not Applicable Not Applicable  Medication Review (RN Care Manager) Complete - -  Some recent data might be hidden

## 2019-01-02 NOTE — Progress Notes (Signed)
[facility] at [place] NAME: Paul [name]    MR#:  [id number]  DATE OF BIRTH:  December 17, 1965  SUBJECTIVE:  Patient doing well.  Awaiting discharge from the hospital once we have dialysis outpatient set up   REVIEW OF SYSTEMS:    Review of Systems  Constitutional: Negative for fever, chills weight loss HENT: Negative for ear pain, nosebleeds, congestion, facial swelling, rhinorrhea, neck pain, neck stiffness and ear discharge.   Respiratory: Negative for cough, shortness of breath, wheezing  Cardiovascular: Negative for chest pain, palpitations and leg swelling.  Gastrointestinal: Negative for heartburn, abdominal pain, vomiting, diarrhea or consitpation Genitourinary: Negative for dysuria, urgency, frequency, hematuria Musculoskeletal: Negative for back pain or joint pain Neurological: Negative for dizziness, seizures, syncope, focal weakness,  numbness and headaches.  Hematological: Does not bruise/bleed easily.  Psychiatric/Behavioral: Negative for hallucinations, confusion, dysphoric mood    Tolerating Diet: yes      DRUG ALLERGIES:   Allergies  Allergen Reactions  . Other Other (See Comments)    Cardiac Problems. Pt states he tolerates Toradol. Due to kidney and heart problems per pt  . Ibuprofen Other (See Comments)    Heart problems  . Baclofen Other (See Comments)  . Metformin Diarrhea  . Nsaids     Due to kidney and heart problems per pt    VITALS:  Blood pressure 115/74, pulse 75, temperature 98.3 F (36.8 C), temperature source Oral, resp. rate 18, height 5\' 9"  (1.753 m), weight 71.3 kg, SpO2 100 %.  PHYSICAL EXAMINATION:  Constitutional: Appears well-developed and well-nourished. No distress. HENT: Normocephalic. Marland Kitchen Oropharynx is clear and moist.  Eyes: Conjunctivae and EOM are normal. PERRLA, no scleral icterus.  Neck: Normal ROM. Neck supple. No JVD. No tracheal deviation. CVS: RRR, S1/S2 +, no murmurs, no  gallops, no carotid bruit.  Pulmonary: Effort and breath sounds normal, no stridor, rhonchi, wheezes, rales.  Abdominal: Soft. BS +,  no distension, tenderness, rebound or guarding.  Musculoskeletal: Normal range of motion. No edema and no tenderness.  Neuro: Alert. CN 2-12 grossly intact. No focal deficits. Skin:  Right BKA stump dressing intact Psychiatric: Normal mood and affect.      LABORATORY PANEL:   CBC Recent Labs  Lab 12/31/18 0952  WBC 11.7*  HGB 8.2*  HCT 27.5*  PLT 372   ------------------------------------------------------------------------------------------------------------------  Chemistries  Recent Labs  Lab 12/31/18 1051  NA 139  K 3.9  CL 103  CO2 27  GLUCOSE 119*  BUN 36*  CREATININE 4.23*  CALCIUM 8.3*   ------------------------------------------------------------------------------------------------------------------  Cardiac Enzymes No results for input(s): TROPONINI in the last 168 hours. ------------------------------------------------------------------------------------------------------------------  RADIOLOGY:  No results found.   ASSESSMENT AND PLAN:   53 year old male with end-stage renal disease on hemodialysis, PAF and chronic systolic heart failure who presented to the emergency room due to concern of right heel osteomyelitis.  1.  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 No acute issues.  He will have outpatient follow-up with vascular surgical services in 2 weeks after discharge.  2.  End-stage renal disease on hemodialysis: Continue dialysis as per  his routine schedule. 3.  PAF currently normal sinus rhythm: Continue amiodarone and Coreg for heart rate control  4.  Chronic systolic heart failure without signs of exacerbation  5.  Diabetes type 2 on ADA diet  6.  Acute on chronic anemia of end-stage renal disease status post 1 unit PRBC on August 25. Continue ferrous  sulfate  COVID testing negative.     Management plans discussed with the patient and he is in agreement.  CODE STATUS: full  TOTAL TIME TAKING CARE OF THIS PATIENT: 22 minutes.     POSSIBLE D/C monday, DEPENDING ON CLINICAL CONDITION.   Bettey Costa M.D on 01/02/2019 at 11:26 AM  Between 7am to 6pm - Pager - [phone] After 6pm go to [email or web address] - password [email or web address] [facility]  Office  [phone]  CC: Primary care physician; Tracie Harrier, MD  Note: This dictation was prepared with Dragon dictation along with smaller phrase technology. Any transcriptional errors that result from this process are unintentional.

## 2019-01-02 NOTE — Progress Notes (Signed)
Spoke with Joan Flores at [facility] dialysis to confirm a chair time, she informed that they are still waiting on MD approval. She was hopeful to hear something today. I will follow up again this afternoon.

## 2019-01-02 NOTE — TOC Transition Note (Signed)
Transition of Care [facility]) - CM/SW Discharge Note   Patient Details  Name: Paul [name]. MRN: [id number] Date of Birth: 1965-09-07  Transition of Care [facility]) CM/SW Contact:  [name], RN Phone Number: 01/02/2019, 3:39 PM   Clinical Narrative:     Patient to DC to [facility] tomorrow after Dialysis, He has a chair time in [facility] On Wednesday at 640 AM Bedside nurse to call report to [phone] when ready, EMS to transport when nurse calls        Patient Goals and CMS Choice Patient states their goals for this hospitalization and ongoing recovery are:: "I hope that I can get short term rehab because I live alone" CMS Medicare.gov Compare Post Acute Care list provided to:: Patient Choice offered to / list presented to : Patient  Discharge Placement                       Discharge Plan and Services     Post Acute Care Choice: [place]                               Social Determinants of Health (SDOH) Interventions     Readmission Risk Interventions Readmission Risk Prevention Plan 11/25/2018 10/06/2018 09/09/2018  Transportation Screening Complete Complete Complete  PCP or Specialist Appt within 3-5 Days Complete Complete Complete  HRI or Home Care Consult Complete Complete Complete  Social Work Consult for [place] Planning/Counseling Complete Complete Not Complete  SW consult not completed comments - - NA  Palliative Care Screening Not Applicable Not Applicable Not Applicable  Medication Review (RN Care Manager) Complete - -  Some recent data might be hidden

## 2019-01-02 NOTE — TOC Progression Note (Signed)
Transition of Care (TOC) - Progression Note    Patient Details  Name: Paul [name]. MRN: [id number] Date of Birth: 23-Feb-1966  Transition of Care [facility]) CM/SW Contact  [name], RN Phone Number: 01/02/2019, 3:08 PM  Clinical Narrative:     Patient chair time 640 Wednesday at [facility] Dialysis, He will need to wait until tomorrow after Dialysis. Notified [facility]   Expected Discharge Plan: [place]    Expected Discharge Plan and Services Expected Discharge Plan: [place] Choice: [place] Living arrangements for the past 2 months: Single Family Home Expected Discharge Date: 01/02/19                                     Social Determinants of Health (SDOH) Interventions    Readmission Risk Interventions Readmission Risk Prevention Plan 11/25/2018 10/06/2018 09/09/2018  Transportation Screening Complete Complete Complete  PCP or Specialist Appt within 3-5 Days Complete Complete Complete  HRI or Home Care Consult Complete Complete Complete  Social Work Consult for [place] Planning/Counseling Complete Complete Not Complete  SW consult not completed comments - - NA  Palliative Care Screening Not Applicable Not Applicable Not Applicable  Medication Review (RN Care Manager) Complete - -  Some recent data might be hidden

## 2019-01-02 NOTE — Discharge Summary (Addendum)
[facility] NAME: Paul [name]    MR#:  [id number]  DATE OF BIRTH:  [birth year]  DATE OF ADMISSION:  12/21/2018 ADMITTING PHYSICIAN: Dustin Flock, MD  DATE OF DISCHARGE:01/03/2019  PRIMARY CARE PHYSICIAN: Tracie Harrier, MD    ADMISSION DIAGNOSIS:  Wound infection [T14.8XXA, L08.9]  DISCHARGE DIAGNOSIS:  Active Problems:   Right foot infection   SECONDARY DIAGNOSIS:   Past Medical History:  Diagnosis Date  . Anemia   . Cardiac defibrillator in place    a. Biotronik LUmax 540 DRT, (ser # [id number]).  . Carotid arterial disease ([place])    a. s/p prior LICA stenting;  b. 05/2456 Carotid U/S: 40-59% bilat ICA stenosis. Patent LICA stent.  . CHF (congestive heart failure) ([place])   . CKD (chronic kidney disease), stage III ([place])   . Coronary artery disease    a. 2010 s/p CABG x 3.  . DDD (degenerative disc disease), lumbosacral    L5-S1  . Diabetes ([place])    Lantus at bedtime  . Gangrene of toe of right foot ([place])   . Gout of left hand 10/06/2016  . HFrEF (heart failure with reduced ejection fraction) ([place])    a. 07/2016 Echo: EF 25-30%, diff HK, mild MR, mildly dil LA, mod reduced RV fxn, PASP 74mmHg.  [place] Kitchen Hypertension    takes Coreg daily  . Ischemic cardiomyopathy    a. 07/2016 Echo: EF 25-30%, diff HK.  . Myocardial infarction ([place]) 2009  . Peripheral vascular disease ([place])   . Sleep apnea    sleep study yr ago-unable to afford cpap  . Stroke [facility]) 09   no weakness    HOSPITAL COURSE:   53 year old male with end-stage renal disease on hemodialysis, PAF and chronic systolic heart failure who presented to the emergency room due to concern of right heel osteomyelitis.  1.  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 No acute issues.  He will have outpatient follow-up with vascular surgical services in 2 weeks after discharge.  2.  End-stage renal disease on hemodialysis:  Continue dialysis as per  his routine schedule. 3.  PAF currently normal sinus rhythm: Continue amiodarone and Coreg for heart rate control  4.  Chronic systolic heart failure without signs of exacerbation  5.  Diabetes type 2 on ADA diet  6.  Acute on chronic anemia of end-stage renal disease status post 1 unit PRBC on August 25. Continue ferrous sulfate  COVID testing negative.    DISCHARGE CONDITIONS AND DIET:  Stable Renal diet  CONSULTS OBTAINED:    DRUG ALLERGIES:   Allergies  Allergen Reactions  . Other Other (See Comments)    Cardiac Problems. Pt states he tolerates Toradol. Due to kidney and heart problems per pt  . Ibuprofen Other (See Comments)    Heart problems  . Baclofen Other (See Comments)  . Metformin Diarrhea  . Nsaids     Due to kidney and heart problems per pt    DISCHARGE MEDICATIONS:   Allergies as of 01/03/2019      Reactions   Other Other (See Comments)   Cardiac Problems. Pt states he tolerates Toradol. Due to kidney and heart problems per pt   Ibuprofen Other (See Comments)   Heart problems   Baclofen Other (See Comments)   Metformin Diarrhea   Nsaids    Due to kidney and heart problems per pt      Medication List  STOP taking these medications   cefTAZidime  IVPB Commonly known as: FORTAZ   daptomycin  IVPB Commonly known as: CUBICIN   HYDROcodone-acetaminophen 5-325 MG tablet Commonly known as: NORCO/VICODIN   linezolid 600 MG tablet Commonly known as: ZYVOX   torsemide 20 MG tablet Commonly known as: DEMADEX     TAKE these medications   allopurinol 100 MG tablet Commonly known as: ZYLOPRIM Take 1 tablet (100 mg total) by mouth daily.   amiodarone 100 MG tablet Commonly known as: PACERONE Take 100 mg by mouth daily.   aspirin EC 81 MG tablet Take 1 tablet (81 mg total) by mouth daily.   calcium acetate 667 MG capsule Commonly known as: PHOSLO Take 1,334 mg by mouth 3 (three) times daily.   carvedilol  3.125 MG tablet Commonly known as: COREG Take 1 tablet (3.125 mg total) by mouth 2 (two) times daily.   clopidogrel 75 MG tablet Commonly known as: PLAVIX Take 1 tablet (75 mg total) by mouth daily with breakfast.   dicyclomine 10 MG capsule Commonly known as: BENTYL Take 1 capsule (10 mg total) by mouth 4 (four) times daily -  before meals and at bedtime.   ferrous sulfate 325 (65 FE) MG tablet Take 1 tablet (325 mg total) by mouth 2 (two) times daily with a meal.   gabapentin 100 MG capsule Commonly known as: NEURONTIN Take 1 capsule (100 mg total) by mouth 3 (three) times daily.   loperamide 2 MG capsule Commonly known as: IMODIUM Take 1 capsule (2 mg total) by mouth every 8 (eight) hours as needed for diarrhea or loose stools.   predniSONE 5 MG tablet Commonly known as: DELTASONE Take 1 tablet (5 mg total) by mouth daily with breakfast.         Today   CHIEF COMPLAINT:  Ready to leave hospital no issues overnight   VITAL SIGNS:  Blood pressure 128/82, pulse 72, temperature 98.3 F (36.8 C), temperature source Oral, resp. rate 18, height 5\' 9"  (1.753 m), weight 71.3 kg, SpO2 100 %.   REVIEW OF SYSTEMS:  Review of Systems  Constitutional: Negative.  Negative for chills, fever and malaise/fatigue.  HENT: Negative.  Negative for ear discharge, ear pain, hearing loss, nosebleeds and sore throat.   Eyes: Negative.  Negative for blurred vision and pain.  Respiratory: Negative.  Negative for cough, hemoptysis, shortness of breath and wheezing.   Cardiovascular: Negative.  Negative for chest pain, palpitations and leg swelling.  Gastrointestinal: Negative.  Negative for abdominal pain, blood in stool, diarrhea, nausea and vomiting.  Genitourinary: Negative.  Negative for dysuria.  Musculoskeletal: Negative.  Negative for back pain.  Skin: Negative.   Neurological: Negative for dizziness, tremors, speech change, focal weakness, seizures and headaches.   Endo/Heme/Allergies: Negative.  Does not bruise/bleed easily.  Psychiatric/Behavioral: Negative.  Negative for depression, hallucinations and suicidal ideas.     PHYSICAL EXAMINATION:  GENERAL:  53 y.o.-year-old patient lying in the bed with no acute distress.  NECK:  Supple, no jugular venous distention. No thyroid enlargement, no tenderness.  LUNGS: Normal breath sounds bilaterally, no wheezing, rales,rhonchi  No use of accessory muscles of respiration.  CARDIOVASCULAR: S1, S2 normal. No murmurs, rubs, or gallops.  ABDOMEN: Soft, non-tender, non-distended. Bowel sounds present. No organomegaly or mass.  EXTREMITIES: No pedal edema, cyanosis, or clubbing.   PSYCHIATRIC: The patient is alert and oriented x 3.  SKIN: Right BKA stump dressing intact  DATA REVIEW:   CBC Recent Labs  Lab 12/31/18  0952  WBC 11.7*  HGB 8.2*  HCT 27.5*  PLT 372    Chemistries  Recent Labs  Lab 12/31/18 1051  NA 139  K 3.9  CL 103  CO2 27  GLUCOSE 119*  BUN 36*  CREATININE 4.23*  CALCIUM 8.3*    Cardiac Enzymes No results for input(s): TROPONINI in the last 168 hours.  Microbiology Results  @MICRORSLT48 @  RADIOLOGY:  No results found.    Allergies as of 01/03/2019      Reactions   Other Other (See Comments)   Cardiac Problems. Pt states he tolerates Toradol. Due to kidney and heart problems per pt   Ibuprofen Other (See Comments)   Heart problems   Baclofen Other (See Comments)   Metformin Diarrhea   Nsaids    Due to kidney and heart problems per pt      Medication List    STOP taking these medications   cefTAZidime  IVPB Commonly known as: FORTAZ   daptomycin  IVPB Commonly known as: CUBICIN   HYDROcodone-acetaminophen 5-325 MG tablet Commonly known as: NORCO/VICODIN   linezolid 600 MG tablet Commonly known as: ZYVOX   torsemide 20 MG tablet Commonly known as: DEMADEX     TAKE these medications   allopurinol 100 MG tablet Commonly known as:  ZYLOPRIM Take 1 tablet (100 mg total) by mouth daily.   amiodarone 100 MG tablet Commonly known as: PACERONE Take 100 mg by mouth daily.   aspirin EC 81 MG tablet Take 1 tablet (81 mg total) by mouth daily.   calcium acetate 667 MG capsule Commonly known as: PHOSLO Take 1,334 mg by mouth 3 (three) times daily.   carvedilol 3.125 MG tablet Commonly known as: COREG Take 1 tablet (3.125 mg total) by mouth 2 (two) times daily.   clopidogrel 75 MG tablet Commonly known as: PLAVIX Take 1 tablet (75 mg total) by mouth daily with breakfast.   dicyclomine 10 MG capsule Commonly known as: BENTYL Take 1 capsule (10 mg total) by mouth 4 (four) times daily -  before meals and at bedtime.   ferrous sulfate 325 (65 FE) MG tablet Take 1 tablet (325 mg total) by mouth 2 (two) times daily with a meal.   gabapentin 100 MG capsule Commonly known as: NEURONTIN Take 1 capsule (100 mg total) by mouth 3 (three) times daily.   loperamide 2 MG capsule Commonly known as: IMODIUM Take 1 capsule (2 mg total) by mouth every 8 (eight) hours as needed for diarrhea or loose stools.   predniSONE 5 MG tablet Commonly known as: DELTASONE Take 1 tablet (5 mg total) by mouth daily with breakfast.         Management plans discussed with the patient and he is in agreement. Stable for discharge   Patient should follow up with surgery  CODE STATUS:     Code Status Orders  (From admission, onward)         Start     Ordered   12/21/18 2024  Full code  Continuous     12/21/18 2024        Code Status History    Date Active Date Inactive Code Status Order ID Comments User Context   10/04/2018 1958 10/11/2018 1801 Full Code [id number]  Sela Hua, MD Inpatient   09/06/2018 0259 09/12/2018 2017 Full Code [id number]  Lance Coon, MD Inpatient   07/25/2018 1717 08/02/2018 1919 Full Code [id number]  Dustin Flock, MD Inpatient   05/25/2018 [phone] 05/29/2018 2022 Full  Code [id number]  Hillary Bow, MD ED    10/09/2016 1003 10/09/2016 1340 Full Code [id number]  Samara Deist, [facility] Inpatient   08/22/2016 0042 08/27/2016 1649 Full Code [id number]  Lance Coon, MD Inpatient   07/18/2014 1818 07/20/2014 2002 Full Code [id number]  [facility] Inpatient   Advance Care Planning Activity      TOTAL TIME TAKING CARE OF THIS PATIENT: 38 minutes.    Note: This dictation was prepared with Dragon dictation along with smaller phrase technology. Any transcriptional errors that result from this process are unintentional.  Bettey Costa M.D on 01/03/2019 at 8:44 AM  Between 7am to 6pm - Pager - [phone] After 6pm go to [email or web address] - password [email or web address]  Office  [phone]  CC: Primary care physician; Tracie Harrier, MD

## 2019-01-02 NOTE — Care Management Important Message (Signed)
Important Message  Patient Details  Name: Paul [name]. MRN: [id number] Date of Birth: 21-Dec-1965   Medicare Important Message Given:  Yes     [name], RN 01/02/2019, 11:31 AM

## 2019-01-02 NOTE — Progress Notes (Signed)
[facility]  ROUNDING NOTE   Subjective:  Patient seen at bedside. It appears that disposition has been found at a nursing home and rehabilitation facility in [facility]. Patient will need dialysis tomorrow a.m.   Objective:  Vital signs in last 24 hours:  Temp:  [98 F (36.7 C)-98.6 F (37 C)] 98.5 F (36.9 C) (08/31 1525) Pulse Rate:  [75-79] 76 (08/31 1525) Resp:  [18] 18 (08/30 2257) BP: (104-115)/(60-75) 113/60 (08/31 1525) SpO2:  [100 %] 100 % (08/31 1525)  Weight change:  Filed Weights   12/29/18 1231  Weight: 71.3 kg    Intake/Output: I/O last 3 completed shifts: In: 240 [P.O.:240] Out: 100 [Urine:100]   Intake/Output this shift:  Total I/O In: 120 [P.O.:120] Out: -   Physical Exam: General: No acute distress  Head: Normocephalic, atraumatic. Moist oral mucosal membranes  Eyes: Anicteric  Neck: Supple   Lungs:  Clear to auscultation, normal effort  Heart: regular  Abdomen:  Soft, nontender, bowel sounds present  Extremities: Left  trace edema, right BKA with dressings.   Neurologic: Awake, alert, following commands  Skin: No lesions  Access: IJ PermCath.    Basic Metabolic Panel: Recent Labs  Lab 12/28/18 0746 12/31/18 1051  NA  --  139  K 4.7 3.9  CL  --  103  CO2  --  27  GLUCOSE  --  119*  BUN  --  36*  CREATININE  --  4.23*  CALCIUM  --  8.3*  PHOS  --  2.1*    Liver Function Tests: Recent Labs  Lab 12/31/18 1051  ALBUMIN 2.7*   No results for input(s): LIPASE, AMYLASE in the last 168 hours. No results for input(s): AMMONIA in the last 168 hours.  CBC: Recent Labs  Lab 12/28/18 0746 12/31/18 0952  WBC 13.3* 11.7*  NEUTROABS 11.1*  --   HGB 9.7* 8.2*  HCT 32.1* 27.5*  MCV 92.8 94.8  PLT 384 372    Cardiac Enzymes: No results for input(s): CKTOTAL, CKMB, CKMBINDEX, TROPONINI in the last 168 hours.  BNP: Invalid input(s): POCBNP  CBG: Recent Labs  Lab 01/01/19 1159 01/01/19 1641 01/01/19 2118  01/02/19 0737 01/02/19 1146  GLUCAP 167* 166* 202* 96 203*    Microbiology: Results for orders placed or performed during the hospital encounter of 12/21/18  SARS Coronavirus 2 [facility] order, Performed in [facility] hospital lab) Nasopharyngeal Nasopharyngeal Swab     Status: None   Collection Time: 12/21/18  5:14 PM   Specimen: Nasopharyngeal Swab  Result Value Ref Range Status   SARS Coronavirus 2 NEGATIVE NEGATIVE Final    Comment: (NOTE) If result is NEGATIVE SARS-CoV-2 target nucleic acids are NOT DETECTED. The SARS-CoV-2 RNA is generally detectable in upper and lower  respiratory specimens during the acute phase of infection. The lowest  concentration of SARS-CoV-2 viral copies this assay can detect is 250  copies / mL. A negative result does not preclude SARS-CoV-2 infection  and should not be used as the sole basis for treatment or other  patient management decisions.  A negative result may occur with  improper specimen collection / handling, submission of specimen other  than nasopharyngeal swab, presence of viral mutation(s) within the  areas targeted by this assay, and inadequate number of viral copies  (<250 copies / mL). A negative result must be combined with clinical  observations, patient history, and epidemiological information. If result is POSITIVE SARS-CoV-2 target nucleic acids are DETECTED. The SARS-CoV-2 RNA  is generally detectable in upper and lower  respiratory specimens dur ing the acute phase of infection.  Positive  results are indicative of active infection with SARS-CoV-2.  Clinical  correlation with patient history and other diagnostic information is  necessary to determine patient infection status.  Positive results do  not rule out bacterial infection or co-infection with other viruses. If result is PRESUMPTIVE POSTIVE SARS-CoV-2 nucleic acids MAY BE PRESENT.   A presumptive positive result was obtained on the submitted specimen  and  confirmed on repeat testing.  While 2019 novel coronavirus  (SARS-CoV-2) nucleic acids may be present in the submitted sample  additional confirmatory testing may be necessary for epidemiological  and / or clinical management purposes  to differentiate between  SARS-CoV-2 and other Sarbecovirus currently known to infect humans.  If clinically indicated additional testing with an alternate test  methodology [phone]) is advised. The SARS-CoV-2 RNA is generally  detectable in upper and lower respiratory sp ecimens during the acute  phase of infection. The expected result is Negative. Fact Sheet for Patients:  StrictlyIdeas.no Fact Sheet for Healthcare Providers: BankingDealers.co.za This test is not yet approved or cleared by the [place] FDA and has been authorized for detection and/or diagnosis of SARS-CoV-2 by FDA under an Emergency Use Authorization (EUA).  This EUA will remain in effect (meaning this test can be used) for the duration of the COVID-19 declaration under Section 564(b)(1) of the Act, 21 U.S.C. section 360bbb-3(b)(1), unless the authorization is terminated or revoked sooner. Performed at [facility], [address], [place], [place] [address]   Aerobic/Anaerobic Culture (surgical/deep wound)     Status: None   Collection Time: 12/22/18  8:00 AM   Specimen: Wound  Result Value Ref Range Status   Specimen Description   Final    WOUND Performed at [facility], [address] [place], [place] [address]    Special Requests   Final    RIGHT FOOT Performed at [facility], [address] [place], [place] [address]    Gram Stain   Final    RARE WBC PRESENT, PREDOMINANTLY PMN FEW GRAM VARIABLE ROD    Culture   Final    ABUNDANT MULTIPLE ORGANISMS PRESENT, NONE PREDOMINANT NO ANAEROBES ISOLATED Performed at [name], [place] [address] [place], [place] [address]    Report  Status 12/27/2018 FINAL  Final  CULTURE, BLOOD (ROUTINE X 2) w Reflex to ID Panel     Status: None   Collection Time: 12/22/18  9:20 PM   Specimen: BLOOD  Result Value Ref Range Status   Specimen Description BLOOD LEFT ASSIST CONTROL  Final   Special Requests   Final    BOTTLES DRAWN AEROBIC AND ANAEROBIC Blood Culture adequate volume   Culture   Final    NO GROWTH 5 DAYS Performed at [facility], [address] [place], [place] [address]    Report Status 12/27/2018 FINAL  Final  CULTURE, BLOOD (ROUTINE X 2) w Reflex to ID Panel     Status: None   Collection Time: 12/22/18  9:20 PM   Specimen: BLOOD  Result Value Ref Range Status   Specimen Description BLOOD RIGHT ARM  Final   Special Requests   Final    BOTTLES DRAWN AEROBIC AND ANAEROBIC Blood Culture adequate volume   Culture   Final    NO GROWTH 5 DAYS Performed at [facility], [address], [place], [place] [address]  Report Status 12/27/2018 FINAL  Final  MRSA PCR Screening     Status: None   Collection Time: 12/23/18 11:01 AM   Specimen: Nasopharyngeal  Result Value Ref Range Status   MRSA by PCR NEGATIVE NEGATIVE Final    Comment:        The GeneXpert MRSA Assay (FDA approved for NASAL specimens only), is one component of a comprehensive MRSA colonization surveillance program. It is not intended to diagnose MRSA infection nor to guide or monitor treatment for MRSA infections. Performed at [facility], [place], [place] [address]   SARS CORONAVIRUS 2 (TAT 6-12 HRS) Nasal Swab Aptima Multi Swab     Status: None   Collection Time: 12/29/18 10:41 AM   Specimen: Aptima Multi Swab; Nasal Swab  Result Value Ref Range Status   SARS Coronavirus 2 NEGATIVE NEGATIVE Final    Comment: (NOTE) SARS-CoV-2 target nucleic acids are NOT DETECTED. The SARS-CoV-2 RNA is generally detectable in upper and lower respiratory specimens during the acute phase of infection.  Negative results do not preclude SARS-CoV-2 infection, do not rule out co-infections with other pathogens, and should not be used as the sole basis for treatment or other patient management decisions. Negative results must be combined with clinical observations, patient history, and epidemiological information. The expected result is Negative. Fact Sheet for Patients: SugarRoll.be Fact Sheet for Healthcare Providers: https://www.woods-mathews.com/ This test is not yet approved or cleared by the [place] FDA and  has been authorized for detection and/or diagnosis of SARS-CoV-2 by FDA under an Emergency Use Authorization (EUA). This EUA will remain  in effect (meaning this test can be used) for the duration of the COVID-19 declaration under Section 56 4(b)(1) of the Act, 21 U.S.C. section 360bbb-3(b)(1), unless the authorization is terminated or revoked sooner. Performed at [facility], [place] [address], [place], [place] [address]   Novel Coronavirus, NAA (hospital order; send-out to ref lab)     Status: None   Collection Time: 01/01/19  1:30 PM   Specimen: Nasopharyngeal Swab; Respiratory  Result Value Ref Range Status   SARS-CoV-2, NAA NOT DETECTED NOT DETECTED Final    Comment: (NOTE) This test was developed and its performance characteristics determined by [employer]. This test has not been FDA cleared or approved. This test has been authorized by FDA under an Emergency Use Authorization (EUA). This test is only authorized for the duration of time the declaration that circumstances exist justifying the authorization of the emergency use of in vitro diagnostic tests for detection of SARS-CoV-2 virus and/or diagnosis of COVID-19 infection under section 564(b)(1) of the Act, 21 U.S.C. 250NLZ-7(Q)(7), unless the authorization is terminated or revoked sooner. When diagnostic testing is negative, the possibility of a false  negative result should be considered in the context of a patient's recent exposures and the presence of clinical signs and symptoms consistent with COVID-19. An individual without symptoms of COVID-19 and who is not shedding SARS-CoV-2 virus would expect to have a negative (not detected) result in this assay. Performed  At: [facility] [place], [place] [id number] Rush Farmer MD IO:[id number]    [place]  Final    Comment: Performed at [facility], [address] [place], [place] [address]    Coagulation Studies: No results for input(s): LABPROT, INR in the last 72 hours.  Urinalysis: No results for input(s): COLORURINE, LABSPEC, PHURINE, GLUCOSEU, HGBUR, BILIRUBINUR, KETONESUR, PROTEINUR, UROBILINOGEN, NITRITE, LEUKOCYTESUR in the last 72  hours.  Invalid input(s): APPERANCEUR    Imaging: No results found.   Medications:   . sodium chloride     . allopurinol  100 mg Oral Daily  . amiodarone  100 mg Oral Daily  . aspirin  81 mg Oral Daily  . calcium acetate  667 mg Oral TID WC  . carvedilol  3.125 mg Oral BID  . Chlorhexidine Gluconate Cloth  6 each Topical Q0600  . clopidogrel  75 mg Oral Daily  . dicyclomine  10 mg Oral TID AC & HS  . ferrous sulfate  325 mg Oral BID WC  . gabapentin  100 mg Oral TID  . heparin  5,000 Units Subcutaneous Q8H  . insulin aspart  0-5 Units Subcutaneous QHS  . insulin aspart  0-9 Units Subcutaneous TID WC  . predniSONE  5 mg Oral Q breakfast  . sodium chloride flush  3 mL Intravenous Q12H  . tuberculin  5 Units Intradermal Once   sodium chloride, acetaminophen **OR** acetaminophen, HYDROcodone-acetaminophen, loperamide, morphine injection, ondansetron **OR** ondansetron (ZOFRAN) IV, sodium chloride flush  Assessment/ Plan:  [name] is a 53 y.o. black male with type 2 diabetes mellitus insulin dependent, diabetic neuropathy, hypertension, coronary artery disease  status post CABG, systolic and diastolic congestive heart failure, status post AICD, carotid stenosis, stroke, obstructive sleep apnea, peripheral vascular disease, gout, osteomyelitis of heel. Underwent right BKA by Dr. 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   [place]   #.  ESRD on HD:   -Patient due for hemodialysis tomorrow a.m. He will be dialyzing as an outpatient in [facility].  #. Anemia of chronic kidney disease:  Status post PRBC transfusion on 8/22.  -Maintain the patient on Epogen during dialysis treatments. Lab Results  Component Value Date   HGB 8.2 (L) 12/31/2018      #. Secondary Hyperparathyroidism:  Phosphorus actually a bit low at 2.1.  No indication for binders at the moment. Lab Results  Component Value Date   PTH 66 (H) 07/31/2018   CALCIUM 8.3 (L) 12/31/2018   PHOS 2.1 (L) 12/31/2018     #. Diabetes mellitus type II with chronic kidney disease: history of poor control.  Lab Results  Component Value Date   HGBA1C 7.5 (H) 12/23/2018  Glycemic control as per hospitalist.   #. 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 Dr. Lucky Cowboy.   -Patient will be going to [facility] for rehabilitation status post right BKA.   LOS: 12 Paul [name] 8/31/20203:48 PM

## 2019-01-03 LAB — GLUCOSE, CAPILLARY
Glucose-Capillary: 174 mg/dL — ABNORMAL HIGH (ref 70–99)
Glucose-Capillary: 162 mg/dL — ABNORMAL HIGH (ref 70–99)
Glucose-Capillary: 125 mg/dL — ABNORMAL HIGH (ref 70–99)
Glucose-Capillary: 165 mg/dL — ABNORMAL HIGH (ref 70–99)

## 2019-01-03 LAB — CBC
HCT: 29.1 % — ABNORMAL LOW (ref 39.0–52.0)
Hemoglobin: 8.8 g/dL — ABNORMAL LOW (ref 13.0–17.0)
MCH: 28.8 pg (ref 26.0–34.0)
MCHC: 30.2 g/dL (ref 30.0–36.0)
MCV: 95.1 fL (ref 80.0–100.0)
Platelets: 360 10*3/uL (ref 150–400)
RBC: 3.06 MIL/uL — ABNORMAL LOW (ref 4.22–5.81)
RDW: 18 % — ABNORMAL HIGH (ref 11.5–15.5)
WBC: 10.7 10*3/uL — ABNORMAL HIGH (ref 4.0–10.5)
nRBC: 0 % (ref 0.0–0.2)

## 2019-01-03 LAB — PHOSPHORUS: Phosphorus: 4.1 mg/dL (ref 2.5–4.6)

## 2019-01-03 MED ORDER — HYDROCODONE-ACETAMINOPHEN 5-325 MG PO TABS: 1.0000 | ORAL_TABLET | Freq: Four times a day (QID) | ORAL | 0 refills | Status: DC | PRN

## 2019-01-03 NOTE — Progress Notes (Signed)
Report called to Nicholes Rough @[facility]

## 2019-01-03 NOTE — Progress Notes (Signed)
EMS called for transportation.  

## 2019-01-03 NOTE — Progress Notes (Signed)
[facility]  ROUNDING NOTE   Subjective:  Patient seen at bedside. Completed dialysis today. He has been accepted at [facility] dialysis center on MWF.   Objective:  Vital signs in last 24 hours:  Temp:  [98.2 F (36.8 C)-98.8 F (37.1 C)] 98.2 F (36.8 C) (09/01 1307) Pulse Rate:  [72-93] 78 (09/01 1307) Resp:  [12-25] 14 (09/01 1307) BP: (98-145)/(57-98) 115/69 (09/01 1307) SpO2:  [99 %-100 %] 100 % (09/01 1307) Weight:  [92 kg-99 kg] 92 kg (09/01 1307)  Weight change:  Filed Weights   12/29/18 1231 01/03/19 0928 01/03/19 1307  Weight: 71.3 kg 99 kg 92 kg    Intake/Output: I/O last 3 completed shifts: In: 120 [P.O.:120] Out: 100 [Urine:100]   Intake/Output this shift:  Total I/O In: -  Out: 3200 [Urine:200; Other:3000]  Physical Exam: General: No acute distress  Head: Normocephalic, atraumatic. Moist oral mucosal membranes  Eyes: Anicteric  Neck: Supple   Lungs:  Clear to auscultation, normal effort  Heart: regular  Abdomen:  Soft, nontender, bowel sounds present  Extremities: Left  trace edema, right BKA with dressings.   Neurologic: Awake, alert, following commands  Skin: No lesions  Access: IJ PermCath.    Basic Metabolic Panel: Recent Labs  Lab 12/28/18 0746 12/31/18 1051 01/03/19 0939  NA  --  139  --   K 4.7 3.9  --   CL  --  103  --   CO2  --  27  --   GLUCOSE  --  119*  --   BUN  --  36*  --   CREATININE  --  4.23*  --   CALCIUM  --  8.3*  --   PHOS  --  2.1* 4.1    Liver Function Tests: Recent Labs  Lab 12/31/18 1051  ALBUMIN 2.7*   No results for input(s): LIPASE, AMYLASE in the last 168 hours. No results for input(s): AMMONIA in the last 168 hours.  CBC: Recent Labs  Lab 12/28/18 0746 12/31/18 0952 01/03/19 0939  WBC 13.3* 11.7* 10.7*  NEUTROABS 11.1*  --   --   HGB 9.7* 8.2* 8.8*  HCT 32.1* 27.5* 29.1*  MCV 92.8 94.8 95.1  PLT 384 372 360    Cardiac Enzymes: No results for  input(s): CKTOTAL, CKMB, CKMBINDEX, TROPONINI in the last 168 hours.  BNP: Invalid input(s): POCBNP  CBG: Recent Labs  Lab 01/02/19 1146 01/02/19 1636 01/02/19 2107 01/03/19 0730 01/03/19 1338  GLUCAP 203* 175* 174* 162* 125*    Microbiology: Results for orders placed or performed during the hospital encounter of 12/21/18  SARS Coronavirus 2 [facility] order, Performed in [facility] hospital lab) Nasopharyngeal Nasopharyngeal Swab     Status: None   Collection Time: 12/21/18  5:14 PM   Specimen: Nasopharyngeal Swab  Result Value Ref Range Status   SARS Coronavirus 2 NEGATIVE NEGATIVE Final    Comment: (NOTE) If result is NEGATIVE SARS-CoV-2 target nucleic acids are NOT DETECTED. The SARS-CoV-2 RNA is generally detectable in upper and lower  respiratory specimens during the acute phase of infection. The lowest  concentration of SARS-CoV-2 viral copies this assay can detect is 250  copies / mL. A negative result does not preclude SARS-CoV-2 infection  and should not be used as the sole basis for treatment or other  patient management decisions.  A negative result may occur with  improper specimen collection / handling, submission of specimen other  than nasopharyngeal swab, presence  of viral mutation(s) within the  areas targeted by this assay, and inadequate number of viral copies  (<250 copies / mL). A negative result must be combined with clinical  observations, patient history, and epidemiological information. If result is POSITIVE SARS-CoV-2 target nucleic acids are DETECTED. The SARS-CoV-2 RNA is generally detectable in upper and lower  respiratory specimens dur ing the acute phase of infection.  Positive  results are indicative of active infection with SARS-CoV-2.  Clinical  correlation with patient history and other diagnostic information is  necessary to determine patient infection status.  Positive results do  not rule out bacterial infection or co-infection with  other viruses. If result is PRESUMPTIVE POSTIVE SARS-CoV-2 nucleic acids MAY BE PRESENT.   A presumptive positive result was obtained on the submitted specimen  and confirmed on repeat testing.  While 2019 novel coronavirus  (SARS-CoV-2) nucleic acids may be present in the submitted sample  additional confirmatory testing may be necessary for epidemiological  and / or clinical management purposes  to differentiate between  SARS-CoV-2 and other Sarbecovirus currently known to infect humans.  If clinically indicated additional testing with an alternate test  methodology [phone]) is advised. The SARS-CoV-2 RNA is generally  detectable in upper and lower respiratory sp ecimens during the acute  phase of infection. The expected result is Negative. Fact Sheet for Patients:  StrictlyIdeas.no Fact Sheet for Healthcare Providers: BankingDealers.co.za This test is not yet approved or cleared by the [place] FDA and has been authorized for detection and/or diagnosis of SARS-CoV-2 by FDA under an Emergency Use Authorization (EUA).  This EUA will remain in effect (meaning this test can be used) for the duration of the COVID-19 declaration under Section 564(b)(1) of the Act, 21 U.S.C. section 360bbb-3(b)(1), unless the authorization is terminated or revoked sooner. Performed at [facility], [address], [place], [place] [address]   Aerobic/Anaerobic Culture (surgical/deep wound)     Status: None   Collection Time: 12/22/18  8:00 AM   Specimen: Wound  Result Value Ref Range Status   Specimen Description   Final    WOUND Performed at [facility], [address], [place], [place] [address]    Special Requests   Final    RIGHT FOOT Performed at [facility], [address] [place], [place] [address]    Gram Stain   Final    RARE WBC PRESENT, PREDOMINANTLY PMN FEW GRAM VARIABLE ROD    Culture   Final     ABUNDANT MULTIPLE ORGANISMS PRESENT, NONE PREDOMINANT NO ANAEROBES ISOLATED Performed at [facility], [place] [address] [place], [place] [address]    Report Status 12/27/2018 FINAL  Final  CULTURE, BLOOD (ROUTINE X 2) w Reflex to ID Panel     Status: None   Collection Time: 12/22/18  9:20 PM   Specimen: BLOOD  Result Value Ref Range Status   Specimen Description BLOOD LEFT ASSIST CONTROL  Final   Special Requests   Final    BOTTLES DRAWN AEROBIC AND ANAEROBIC Blood Culture adequate volume   Culture   Final    NO GROWTH 5 DAYS Performed at [facility], [address] [place],  [address]    Report Status 12/27/2018 FINAL  Final  CULTURE, BLOOD (ROUTINE X 2) w Reflex to ID Panel     Status: None   Collection Time: 12/22/18  9:20 PM   Specimen: BLOOD  Result Value Ref Range Status   Specimen Description BLOOD  RIGHT ARM  Final   Special Requests   Final    BOTTLES DRAWN AEROBIC AND ANAEROBIC Blood Culture adequate volume   Culture   Final    NO GROWTH 5 DAYS Performed at [facility], [address] [place], [place] [address]    Report Status 12/27/2018 FINAL  Final  MRSA PCR Screening     Status: None   Collection Time: 12/23/18 11:01 AM   Specimen: Nasopharyngeal  Result Value Ref Range Status   MRSA by PCR NEGATIVE NEGATIVE Final    Comment:        The GeneXpert MRSA Assay (FDA approved for NASAL specimens only), is one component of a comprehensive MRSA colonization surveillance program. It is not intended to diagnose MRSA infection nor to guide or monitor treatment for MRSA infections. Performed at [facility], [place], [place] [address]   SARS CORONAVIRUS 2 (TAT 6-12 HRS) Nasal Swab Aptima Multi Swab     Status: None   Collection Time: 12/29/18 10:41 AM   Specimen: Aptima Multi Swab; Nasal Swab  Result Value Ref Range Status   SARS Coronavirus 2 NEGATIVE NEGATIVE Final    Comment: (NOTE) SARS-CoV-2  target nucleic acids are NOT DETECTED. The SARS-CoV-2 RNA is generally detectable in upper and lower respiratory specimens during the acute phase of infection. Negative results do not preclude SARS-CoV-2 infection, do not rule out co-infections with other pathogens, and should not be used as the sole basis for treatment or other patient management decisions. Negative results must be combined with clinical observations, patient history, and epidemiological information. The expected result is Negative. Fact Sheet for Patients: SugarRoll.be Fact Sheet for Healthcare Providers: https://www.woods-mathews.com/ This test is not yet approved or cleared by the [place] FDA and  has been authorized for detection and/or diagnosis of SARS-CoV-2 by FDA under an Emergency Use Authorization (EUA). This EUA will remain  in effect (meaning this test can be used) for the duration of the COVID-19 declaration under Section 56 4(b)(1) of the Act, 21 U.S.C. section 360bbb-3(b)(1), unless the authorization is terminated or revoked sooner. Performed at [facility], [place] [address], [place],  [address]   Novel Coronavirus, NAA (hospital order; send-out to ref lab)     Status: None   Collection Time: 01/01/19  1:30 PM   Specimen: Nasopharyngeal Swab; Respiratory  Result Value Ref Range Status   SARS-CoV-2, NAA NOT DETECTED NOT DETECTED Final    Comment: (NOTE) This test was developed and its performance characteristics determined by [employer]. This test has not been FDA cleared or approved. This test has been authorized by FDA under an Emergency Use Authorization (EUA). This test is only authorized for the duration of time the declaration that circumstances exist justifying the authorization of the emergency use of in vitro diagnostic tests for detection of SARS-CoV-2 virus and/or diagnosis of COVID-19 infection under section 564(b)(1)  of the Act, 21 U.S.C. 546EVO-3(J)(0), unless the authorization is terminated or revoked sooner. When diagnostic testing is negative, the possibility of a false negative result should be considered in the context of a patient's recent exposures and the presence of clinical signs and symptoms consistent with COVID-19. An individual without symptoms of COVID-19 and who is not shedding SARS-CoV-2 virus would expect to have a negative (not detected) result in this assay. Performed  At: [facility] [address] [place], [place] [id number] Rush Farmer MD BZ:[id number]    [place]  Final  Comment: Performed at [facility], [address] [place], [place] [address]    Coagulation Studies: No results for input(s): LABPROT, INR in the last 72 hours.  Urinalysis: No results for input(s): COLORURINE, LABSPEC, PHURINE, GLUCOSEU, HGBUR, BILIRUBINUR, KETONESUR, PROTEINUR, UROBILINOGEN, NITRITE, LEUKOCYTESUR in the last 72 hours.  Invalid input(s): APPERANCEUR    Imaging: No results found.   Medications:   . sodium chloride     . allopurinol  100 mg Oral Daily  . amiodarone  100 mg Oral Daily  . aspirin  81 mg Oral Daily  . calcium acetate  667 mg Oral TID WC  . carvedilol  3.125 mg Oral BID  . Chlorhexidine Gluconate Cloth  6 each Topical Q0600  . clopidogrel  75 mg Oral Daily  . dicyclomine  10 mg Oral TID AC & HS  . ferrous sulfate  325 mg Oral BID WC  . gabapentin  100 mg Oral TID  . heparin  5,000 Units Subcutaneous Q8H  . insulin aspart  0-5 Units Subcutaneous QHS  . insulin aspart  0-9 Units Subcutaneous TID WC  . predniSONE  5 mg Oral Q breakfast  . sodium chloride flush  3 mL Intravenous Q12H  . tuberculin  5 Units Intradermal Once   sodium chloride, acetaminophen **OR** acetaminophen, HYDROcodone-acetaminophen, loperamide, morphine injection, ondansetron **OR** ondansetron (ZOFRAN) IV, sodium chloride  flush  Assessment/ Plan:  [name] is a 53 y.o. black male with type 2 diabetes mellitus insulin dependent, diabetic neuropathy, hypertension, coronary artery disease status post CABG, systolic and diastolic congestive heart failure, status post AICD, carotid stenosis, stroke, obstructive sleep apnea, peripheral vascular disease, gout, osteomyelitis of heel. Underwent right BKA by Dr. 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   [place]   #.  ESRD on HD:   -Patient completed dialysis today.  He will be dialyzing as an outpatient at [facility] dialysis center MWF at 6:40 AM.  #. Anemia of chronic kidney disease:  Status post PRBC transfusion on 8/22.  -Recommend Epogen as an outpatient. Lab Results  Component Value Date   HGB 8.8 (L) 01/03/2019      #. Secondary Hyperparathyroidism:  Phosphorus within target range at 4.1.  Continue to monitor bone metabolism parameters as an outpatient. Lab Results  Component Value Date   PTH 66 (H) 07/31/2018   CALCIUM 8.3 (L) 12/31/2018   PHOS 4.1 01/03/2019     #. Diabetes mellitus type II with chronic kidney disease: history of poor control.  Lab Results  Component Value Date   HGBA1C 7.5 (H) 12/23/2018  Glycemic control as per hospitalist.   #. 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 Dr. Lucky Cowboy.   -Patient will be going to [facility] for rehabilitation status post right BKA.   LOS: 13 Paul [name] 9/1/20202:01 PM

## 2019-01-03 NOTE — Progress Notes (Signed)
Attempted to call report to [facility], left message for nurse.

## 2019-01-03 NOTE — TOC Transition Note (Signed)
Transition of Care [facility]) - CM/SW Discharge Note   Patient Details  Name: Paul [name]. MRN: [id number] Date of Birth: 1965-08-30  [facility] CM/SW Contact:  [name], RN Phone Number: 01/03/2019, 10:04 AM   Clinical Narrative:    Patient to Discharge today to [facility] in [place] via EMS transport, the bedside nurse to call report to [phone] when ready   Final next level of care: Skilled Nursing Facility Barriers to Discharge: Barriers Resolved   Patient Goals and CMS Choice Patient states their goals for this hospitalization and ongoing recovery are:: "I hope that I can get short term rehab because I live alone" CMS Medicare.gov Compare Post Acute Care list provided to:: Patient Choice offered to / list presented to : Patient  Discharge Placement                       Discharge Plan and Services     Post Acute Care Choice: Paul [name]                               Social Determinants of Health (SDOH) Interventions     Readmission Risk Interventions Readmission Risk Prevention Plan 11/25/2018 10/06/2018 09/09/2018  Transportation Screening Complete Complete Complete  PCP or Specialist Appt within 3-5 Days Complete Complete Complete  HRI or Home Care Consult Complete Complete Complete  Social Work Consult for [place] Planning/Counseling Complete Complete Not Complete  SW consult not completed comments - - NA  Palliative Care Screening Not Applicable Not Applicable Not Applicable  Medication Review (RN Care Manager) Complete - -  Some recent data might be hidden

## 2019-01-03 NOTE — Progress Notes (Signed)
Patient accepted at [facility] dialysis center MWF at 6:40. First treatment will be Wednesday 9/2 at 6:40. Patient is aware.

## 2019-01-03 NOTE — Progress Notes (Signed)
EMS here to transport pt. 

## 2019-01-03 NOTE — Progress Notes (Signed)
PT Cancellation Note  Patient Details Name: Paul [name]. MRN: [id number] DOB: 11/07/1965   Cancelled Treatment:    Reason Eval/Treat Not Completed: Other (comment)(Pt out of room at this time for HD, PT will follow up as able.)   Lieutenant Diego PT, DPT 10:00 AM,01/03/19 [phone]

## 2019-01-03 NOTE — Progress Notes (Signed)
Norco prescription for 15 tablets given as per Dr. Genia Harold, attending physicians request

## 2019-01-04 ENCOUNTER — Telehealth (INDEPENDENT_AMBULATORY_CARE_PROVIDER_SITE_OTHER): Payer: Self-pay | Admitting: Vascular Surgery

## 2019-01-04 DIAGNOSIS — D509 Iron deficiency anemia, unspecified: Secondary | ICD-10-CM | POA: Diagnosis not present

## 2019-01-04 DIAGNOSIS — M109 Gout, unspecified: Secondary | ICD-10-CM | POA: Diagnosis not present

## 2019-01-04 DIAGNOSIS — K589 Irritable bowel syndrome without diarrhea: Secondary | ICD-10-CM | POA: Diagnosis not present

## 2019-01-04 DIAGNOSIS — E119 Type 2 diabetes mellitus without complications: Secondary | ICD-10-CM | POA: Diagnosis not present

## 2019-01-04 DIAGNOSIS — D631 Anemia in chronic kidney disease: Secondary | ICD-10-CM | POA: Diagnosis not present

## 2019-01-04 DIAGNOSIS — N186 End stage renal disease: Secondary | ICD-10-CM | POA: Diagnosis not present

## 2019-01-04 DIAGNOSIS — I2510 Atherosclerotic heart disease of native coronary artery without angina pectoris: Secondary | ICD-10-CM | POA: Diagnosis not present

## 2019-01-04 DIAGNOSIS — N2581 Secondary hyperparathyroidism of renal origin: Secondary | ICD-10-CM | POA: Diagnosis not present

## 2019-01-04 DIAGNOSIS — I509 Heart failure, unspecified: Secondary | ICD-10-CM | POA: Diagnosis not present

## 2019-01-04 NOTE — Telephone Encounter (Signed)
Spoke with the patient and explained that the orders that were sent to the rehab are detailed in regards to his wound care. Patient stated that we were not going to help him. I explained again that his wound care orders are detailed and should have been followed by the nursing staff for his care. I explained that if they are not dressing his wound correctly he can ask them to do it correctly. Patient was not happy with my answer. Spoke with Marcelle Overlie PA, she put his orders in for his wound care at the rehab facility and she stated they were very detailed as to how to care for his wound.

## 2019-01-06 ENCOUNTER — Telehealth (INDEPENDENT_AMBULATORY_CARE_PROVIDER_SITE_OTHER): Payer: Self-pay | Admitting: Vascular Surgery

## 2019-01-06 NOTE — Telephone Encounter (Signed)
I spoke with the patient and explained from prior note that the wound orders were sent to he facility when he was discharge.The patient had stated that the nurse did not see the wound orders so I advise the nurse should contact the office.

## 2019-01-07 DIAGNOSIS — N186 End stage renal disease: Secondary | ICD-10-CM | POA: Diagnosis not present

## 2019-01-07 DIAGNOSIS — D631 Anemia in chronic kidney disease: Secondary | ICD-10-CM | POA: Diagnosis not present

## 2019-01-07 DIAGNOSIS — D509 Iron deficiency anemia, unspecified: Secondary | ICD-10-CM | POA: Diagnosis not present

## 2019-01-07 DIAGNOSIS — N2581 Secondary hyperparathyroidism of renal origin: Secondary | ICD-10-CM | POA: Diagnosis not present

## 2019-01-09 DIAGNOSIS — I2510 Atherosclerotic heart disease of native coronary artery without angina pectoris: Secondary | ICD-10-CM | POA: Diagnosis not present

## 2019-01-09 DIAGNOSIS — M5136 Other intervertebral disc degeneration, lumbar region: Secondary | ICD-10-CM | POA: Diagnosis not present

## 2019-01-09 DIAGNOSIS — N189 Chronic kidney disease, unspecified: Secondary | ICD-10-CM | POA: Diagnosis not present

## 2019-01-09 DIAGNOSIS — M109 Gout, unspecified: Secondary | ICD-10-CM | POA: Diagnosis not present

## 2019-01-10 ENCOUNTER — Ambulatory Visit (INDEPENDENT_AMBULATORY_CARE_PROVIDER_SITE_OTHER): Payer: Medicare Other | Admitting: Vascular Surgery

## 2019-01-10 DIAGNOSIS — D509 Iron deficiency anemia, unspecified: Secondary | ICD-10-CM | POA: Diagnosis not present

## 2019-01-10 DIAGNOSIS — N2581 Secondary hyperparathyroidism of renal origin: Secondary | ICD-10-CM | POA: Diagnosis not present

## 2019-01-10 DIAGNOSIS — N186 End stage renal disease: Secondary | ICD-10-CM | POA: Diagnosis not present

## 2019-01-10 DIAGNOSIS — D631 Anemia in chronic kidney disease: Secondary | ICD-10-CM | POA: Diagnosis not present

## 2019-01-11 DIAGNOSIS — S88911A Complete traumatic amputation of right lower leg, level unspecified, initial encounter: Secondary | ICD-10-CM | POA: Diagnosis not present

## 2019-01-11 DIAGNOSIS — W19XXXA Unspecified fall, initial encounter: Secondary | ICD-10-CM | POA: Diagnosis not present

## 2019-01-12 DIAGNOSIS — D631 Anemia in chronic kidney disease: Secondary | ICD-10-CM | POA: Diagnosis not present

## 2019-01-12 DIAGNOSIS — N2581 Secondary hyperparathyroidism of renal origin: Secondary | ICD-10-CM | POA: Diagnosis not present

## 2019-01-12 DIAGNOSIS — D509 Iron deficiency anemia, unspecified: Secondary | ICD-10-CM | POA: Diagnosis not present

## 2019-01-12 DIAGNOSIS — N186 End stage renal disease: Secondary | ICD-10-CM | POA: Diagnosis not present

## 2019-01-14 DIAGNOSIS — N2581 Secondary hyperparathyroidism of renal origin: Secondary | ICD-10-CM | POA: Diagnosis not present

## 2019-01-14 DIAGNOSIS — D631 Anemia in chronic kidney disease: Secondary | ICD-10-CM | POA: Diagnosis not present

## 2019-01-14 DIAGNOSIS — D509 Iron deficiency anemia, unspecified: Secondary | ICD-10-CM | POA: Diagnosis not present

## 2019-01-14 DIAGNOSIS — N186 End stage renal disease: Secondary | ICD-10-CM | POA: Diagnosis not present

## 2019-01-16 DIAGNOSIS — F4320 Adjustment disorder, unspecified: Secondary | ICD-10-CM | POA: Diagnosis not present

## 2019-01-16 DIAGNOSIS — S88911A Complete traumatic amputation of right lower leg, level unspecified, initial encounter: Secondary | ICD-10-CM | POA: Diagnosis not present

## 2019-01-17 DIAGNOSIS — N186 End stage renal disease: Secondary | ICD-10-CM | POA: Diagnosis not present

## 2019-01-17 DIAGNOSIS — D631 Anemia in chronic kidney disease: Secondary | ICD-10-CM | POA: Diagnosis not present

## 2019-01-17 DIAGNOSIS — N2581 Secondary hyperparathyroidism of renal origin: Secondary | ICD-10-CM | POA: Diagnosis not present

## 2019-01-17 DIAGNOSIS — D509 Iron deficiency anemia, unspecified: Secondary | ICD-10-CM | POA: Diagnosis not present

## 2019-01-19 DIAGNOSIS — N186 End stage renal disease: Secondary | ICD-10-CM | POA: Diagnosis not present

## 2019-01-19 DIAGNOSIS — N2581 Secondary hyperparathyroidism of renal origin: Secondary | ICD-10-CM | POA: Diagnosis not present

## 2019-01-19 DIAGNOSIS — D631 Anemia in chronic kidney disease: Secondary | ICD-10-CM | POA: Diagnosis not present

## 2019-01-19 DIAGNOSIS — D509 Iron deficiency anemia, unspecified: Secondary | ICD-10-CM | POA: Diagnosis not present

## 2019-01-20 DIAGNOSIS — K589 Irritable bowel syndrome without diarrhea: Secondary | ICD-10-CM | POA: Diagnosis not present

## 2019-01-20 DIAGNOSIS — I2510 Atherosclerotic heart disease of native coronary artery without angina pectoris: Secondary | ICD-10-CM | POA: Diagnosis not present

## 2019-01-20 DIAGNOSIS — I509 Heart failure, unspecified: Secondary | ICD-10-CM | POA: Diagnosis not present

## 2019-01-20 DIAGNOSIS — M109 Gout, unspecified: Secondary | ICD-10-CM | POA: Diagnosis not present

## 2019-01-21 DIAGNOSIS — D509 Iron deficiency anemia, unspecified: Secondary | ICD-10-CM | POA: Diagnosis not present

## 2019-01-21 DIAGNOSIS — N186 End stage renal disease: Secondary | ICD-10-CM | POA: Diagnosis not present

## 2019-01-21 DIAGNOSIS — N2581 Secondary hyperparathyroidism of renal origin: Secondary | ICD-10-CM | POA: Diagnosis not present

## 2019-01-21 DIAGNOSIS — D631 Anemia in chronic kidney disease: Secondary | ICD-10-CM | POA: Diagnosis not present

## 2019-01-23 ENCOUNTER — Ambulatory Visit (INDEPENDENT_AMBULATORY_CARE_PROVIDER_SITE_OTHER): Payer: Medicare Other | Admitting: Nurse Practitioner

## 2019-01-23 ENCOUNTER — Encounter (INDEPENDENT_AMBULATORY_CARE_PROVIDER_SITE_OTHER): Payer: Self-pay | Admitting: Nurse Practitioner

## 2019-01-23 ENCOUNTER — Other Ambulatory Visit: Payer: Self-pay

## 2019-01-23 VITALS — BP 125/83 | HR 86 | Resp 10 | Ht 70.0 in | Wt 200.0 lb

## 2019-01-23 DIAGNOSIS — L97509 Non-pressure chronic ulcer of other part of unspecified foot with unspecified severity: Secondary | ICD-10-CM

## 2019-01-23 DIAGNOSIS — Z794 Long term (current) use of insulin: Secondary | ICD-10-CM

## 2019-01-23 DIAGNOSIS — N186 End stage renal disease: Secondary | ICD-10-CM

## 2019-01-23 DIAGNOSIS — T8149XA Infection following a procedure, other surgical site, initial encounter: Secondary | ICD-10-CM | POA: Insufficient documentation

## 2019-01-23 DIAGNOSIS — I10 Essential (primary) hypertension: Secondary | ICD-10-CM

## 2019-01-23 DIAGNOSIS — E11621 Type 2 diabetes mellitus with foot ulcer: Secondary | ICD-10-CM

## 2019-01-23 DIAGNOSIS — Z992 Dependence on renal dialysis: Secondary | ICD-10-CM

## 2019-01-23 DIAGNOSIS — S88111A Complete traumatic amputation at level between knee and ankle, right lower leg, initial encounter: Secondary | ICD-10-CM

## 2019-01-23 MED ORDER — HYDROCODONE-ACETAMINOPHEN 5-325 MG PO TABS: 1.0000 | ORAL_TABLET | Freq: Four times a day (QID) | ORAL | 0 refills | Status: DC | PRN

## 2019-01-23 MED ORDER — CEPHALEXIN 500 MG PO CAPS: 500.0000 mg | ORAL_CAPSULE | Freq: Two times a day (BID) | ORAL | 0 refills | Status: DC

## 2019-01-23 NOTE — Progress Notes (Signed)
SUBJECTIVE:  Patient ID: Paul Ala., male    DOB: [birth year], 53 y.o.   MRN: [id number] Chief Complaint  Patient presents with  . Follow-up    HPI  Paul Lea. is a 53 y.o. male presents today for evaluation of his right below-knee amputation.  The lower extremity was amputated on 12/23/2018 due to osteomyelitis.  Initially the wound was doing well per the patient however he fell twice and landed directly on the stump.  Since that time the wound has swollen extensively and now has oozing through some areas with some redness and pain in the stump.  The patient states that prior to him falling he needed little pain medicine however he can barely would stand up to the full 6 hours as prescribed.  The patient also states that his dressings were not being changed at his facility at regular intervals.  He states that at one point his dressing was left on for a full week.  Otherwise the patient denies any fever, chills, nausea, vomiting or diarrhea.  The patient is also a dialysis patient.  Currently he is maintained via a right PermCath which he states has no issues.  His dialysis center has wanted him to undergo vein mapping for some time.  The patient is apprehensive at this time due to the fact that he is not keen on the idea of being stuck by needles 3 times a week.  Past Medical History:  Diagnosis Date  . Anemia   . Cardiac defibrillator in place    a. Biotronik LUmax 540 DRT, (ser # [id number]).  . Carotid arterial disease ([place])    a. s/p prior LICA stenting;  b. 06/4399 Carotid U/S: 40-59% bilat ICA stenosis. Patent LICA stent.  . CHF (congestive heart failure) ([place])   . CKD (chronic kidney disease), stage III ([place])   . Coronary artery disease    a. 2010 s/p CABG x 3.  . DDD (degenerative disc disease), lumbosacral    L5-S1  . Diabetes ([place])    Lantus at bedtime  . Gangrene of toe of right foot ([place])   . Gout of left hand 10/06/2016  . HFrEF (heart failure with reduced  ejection fraction) ([place])    a. 07/2016 Echo: EF 25-30%, diff HK, mild MR, mildly dil LA, mod reduced RV fxn, PASP 63mmHg.  Marland Kitchen Hypertension    takes Coreg daily  . Ischemic cardiomyopathy    a. 07/2016 Echo: EF 25-30%, diff HK.  . Myocardial infarction ([place]) 2009  . Peripheral vascular disease ([place])   . Sleep apnea    sleep study yr ago-unable to afford cpap  . Stroke [facility]) 09   no weakness    Past Surgical History:  Procedure Laterality Date  . AMPUTATION Right 12/23/2018   Procedure: AMPUTATION BELOW KNEE (Right);  Surgeon: Algernon Huxley, MD;  Location: [facility];  Service: General;  Laterality: Right;  . AMPUTATION TOE Right 08/22/2016   Procedure: AMPUTATION TOE;  Surgeon: Samara Deist, DPM;  Location: [facility];  Service: Podiatry;  Laterality: Right;  . AMPUTATION TOE Right 10/09/2016   Procedure: AMPUTATION TOE-RIGHT 2ND MPJ;  Surgeon: Samara Deist, DPM;  Location: [facility];  Service: Podiatry;  Laterality: Right;  . APLIGRAFT PLACEMENT Right 10/09/2016   Procedure: APLIGRAFT PLACEMENT;  Surgeon: Samara Deist, DPM;  Location: [facility];  Service: Podiatry;  Laterality: Right;  . CALCANEAL OSTEOTOMY Bilateral 07/28/2018   Procedure: RIGHT CALCANECTOMY BILATERAL DEBRIDEMENT OF ULCERS ON HEELS;  Surgeon:  Albertine Patricia, DPM;  Location: [facility];  Service: Podiatry;  Laterality: Bilateral;  . CARDIAC DEFIBRILLATOR PLACEMENT  2011  . CAROTID ENDARTERECTOMY Left   . CHOLECYSTECTOMY  2010  . CORONARY ARTERY BYPASS GRAFT  2010   CABG x 3   . DIALYSIS/PERMA CATHETER INSERTION N/A 10/07/2018   Procedure: DIALYSIS/PERMA CATHETER INSERTION;  Surgeon: Katha Cabal, MD;  Location: [place] [facility];  Service: Cardiovascular;  Laterality: N/A;  . GRAFT APPLICATION Right 7/62/2633   Procedure: FULL THICKNESS SKIN GRAFT-RIGHT FOOT;  Surgeon: Algernon Huxley, MD;  Location: [facility];  Service: Vascular;  Laterality: Right;  . HIP SURGERY Right 1994  . INCISION AND DRAINAGE Right 09/08/2018    Procedure: INCISION AND DRAINAGE - [place] OF DEFECTIVE SKIN, SOFT TISSUE AND BONE;  Surgeon: Albertine Patricia, DPM;  Location: [facility];  Service: Podiatry;  Laterality: Right;  . INCISION AND DRAINAGE OF WOUND Right 08/22/2016   Procedure: IRRIGATION AND DEBRIDEMENT WOUND and wound vac placement;  Surgeon: Samara Deist, DPM;  Location: [facility];  Service: Podiatry;  Laterality: Right;  . IRRIGATION AND DEBRIDEMENT ABSCESS Right 11/24/2018   Procedure: IRRIGATION AND DEBRIDEMENT ABSCESS RIGHT FOOT, COMPLICATED, DIABETIC;  Surgeon: Albertine Patricia, DPM;  Location: [facility];  Service: Podiatry;  Laterality: Right;  . LOWER EXTREMITY ANGIOGRAPHY Right 08/24/2016   Procedure: Lower Extremity Angiography;  Surgeon: Algernon Huxley, MD;  Location: [place] [facility];  Service: Cardiovascular;  Laterality: Right;  . LOWER EXTREMITY ANGIOGRAPHY Right 07/27/2018   Procedure: RIGHT Lower Extremity Angiography;  Surgeon: Algernon Huxley, MD;  Location: [place] [facility];  Service: Cardiovascular;  Laterality: Right;  . LOWER EXTREMITY ANGIOGRAPHY Right 09/09/2018   Procedure: Lower Extremity Angiography;  Surgeon: Katha Cabal, MD;  Location: [place] [facility];  Service: Cardiovascular;  Laterality: Right;  . MASS EXCISION Right 07/18/2014   Procedure: EXCISION HETEROTOPIC BONE RIGHT HIP;  Surgeon: Frederik Pear, MD;  Location: [place];  Service: Orthopedics;  Laterality: Right;  . Open Heart Surgery  2010   x 3  . WOUND DEBRIDEMENT Right 10/09/2016   Procedure: DEBRIDEMENT WOUND;  Surgeon: Samara Deist, DPM;  Location: [facility];  Service: Podiatry;  Laterality: Right;    Social History   Socioeconomic History  . Marital status: Legally Separated    Spouse name: Not on file  . Number of children: Not on file  . Years of education: Not on file  . Highest education level: Not on file  Occupational History  . Not on file  Social Needs  . Financial resource strain: Not very hard  . Food insecurity     Worry: Sometimes true    Inability: Never true  . Transportation needs    Medical: No    Non-medical: No  Tobacco Use  . Smoking status: Never Smoker  . Smokeless tobacco: Never Used  Substance and Sexual Activity  . Alcohol use: No  . Drug use: No  . Sexual activity: Not on file  Lifestyle  . Physical activity    Days per week: Not on file    Minutes per session: Not on file  . Stress: Not at all  Relationships  . Social Herbalist on phone: Not on file    Gets together: Not on file    Attends religious service: Not on file    Active member of club or organization: Not on file    Attends meetings of clubs or organizations: Not on file  Relationship status: Not on file  . Intimate partner violence    Fear of current or ex partner: Not on file    Emotionally abused: Not on file    Physically abused: Not on file    Forced sexual activity: Not on file  Other Topics Concern  . Not on file  Social History Narrative  . Not on file    Family History  Problem Relation Age of Onset  . Hypertension Other   . Diabetes Other   . Diabetes Father   . Hyperlipidemia Father   . Hypertension Father   . Hyperlipidemia Sister   . Hypertension Sister   . Diabetes Sister   . Diabetes Brother   . Hypertension Brother   . Hyperlipidemia Brother     Allergies  Allergen Reactions  . Other Other (See Comments)    Cardiac Problems. Pt states he tolerates Toradol. Due to kidney and heart problems per pt  . Ibuprofen Other (See Comments)    Heart problems  . Baclofen Other (See Comments)  . Metformin Diarrhea  . Nsaids     Due to kidney and heart problems per pt     Review of Systems   Review of Systems: Negative Unless Checked Constitutional: [] Weight loss  [] Fever  [] Chills Cardiac: [] Chest pain   []  Atrial Fibrillation  [] Palpitations   [] Shortness of breath when laying flat   [] Shortness of breath with exertion. [] Shortness of breath at rest Vascular:   [] Pain in legs with walking   [] Pain in legs with standing [] Pain in legs when laying flat   [] Claudication    [] Pain in feet when laying flat    [] History of DVT   [] Phlebitis   [x] Swelling in legs   [] Varicose veins   [] Non-healing ulcers Pulmonary:   [] Uses home oxygen   [] Productive cough   [] Hemoptysis   [] Wheeze  [] COPD   [] Asthma Neurologic:  [] Dizziness   [] Seizures  [] Blackouts [] History of stroke   [] History of TIA  [] Aphasia   [] Temporary Blindness   [] Weakness or numbness in arm   [x] Weakness or numbness in leg Musculoskeletal:   [] Joint swelling   [] Joint pain   [] Low back pain  []  History of Knee Replacement [] Arthritis [] back Surgeries  []  Spinal Stenosis    Hematologic:  [] Easy bruising  [] Easy bleeding   [] Hypercoagulable state   [x] Anemic Gastrointestinal:  [] Diarrhea   [] Vomiting  [] Gastroesophageal reflux/heartburn   [] Difficulty swallowing. [] Abdominal pain Genitourinary:  [x] Chronic kidney disease   [] Difficult urination  [] Anuric   [] Blood in urine [] Frequent urination  [] Burning with urination   [] Hematuria Skin:  [] Rashes   [] Ulcers [x] Wounds Psychological:  [] History of anxiety   []  History of major depression  []  Memory Difficulties      OBJECTIVE:   Physical Exam  BP 125/83 (BP Location: Left Arm, Patient Position: Sitting, Cuff Size: Large)   Pulse 86   Resp 10   Ht 5\' 10"  (1.778 m)   Wt 200 lb (90.7 kg)   BMI 28.70 kg/m   Gen: WD/WN, NAD Head: [place]/AT, No temporalis wasting.  Ear/Nose/Throat: Hearing grossly intact, nares w/o erythema or drainage Eyes: PER, EOMI, sclera nonicteric.  Neck: Supple, no masses.  No JVD.  Pulmonary:  Good air movement, no use of accessory muscles.  Cardiac: RRR Vascular: 3+ edema stump and 3+ non pitting edema Left lower extremity  Vessel Right Left  Radial Palpable Palpable   Gastrointestinal: soft, non-distended. No guarding/no peritoneal signs.  Musculoskeletal: Right below-knee amputation.  No deformity or atrophy.   Neurologic: Pain and light touch intact in extremities.  Symmetrical.  Speech is fluent. Motor exam as listed above. Psychiatric: Judgment intact, Mood & affect appropriate for pt's clinical situation. Dermatologic: No Venous rashes. No Ulcers Noted.  No changes consistent with cellulitis. Lymph : No Cervical lymphadenopathy, no lichenification or skin changes of chronic lymphedema.       ASSESSMENT AND PLAN:   1. Below-knee amputation of right lower extremity ([place]) At this time the patient's wound is too swollen to remove any staples.  Staple removal would inevitably cause the wound to dehisce leading to further complications.  We will have the patient return in 2 weeks in order to assess the wound to see about possible staple removal at that time.  Instructions have been sent to the wound care facility to change the dressing on a daily basis as well as to apply compression to the wound.  The patient is also instructed to elevate his stump when he is not working with physical therapy.  The patient is able to keep his knee straight at this time and he is urged to continue to keep his knee straight as this will assist with prosthesis fitting in the future. - HYDROcodone-acetaminophen (NORCO) 5-325 MG tablet; Take 1-2 tablets by mouth every 6 (six) hours as needed for moderate pain or severe pain.  Dispense: 50 tablet; Refill: 0  2. Surgical wound infection We will have the patient take Keflex twice daily for 10 days.  Some of the inflammation may be due to infection as well as some of the increased pain.  I am hopeful that this will help to dry the wound out as well and we will reevaluate when the patient returns in 2 weeks. - cephALEXin (KEFLEX) 500 MG capsule; Take 1 capsule (500 mg total) by mouth 2 (two) times daily.  Dispense: 20 capsule; Refill: 0  3. Essential hypertension Continue antihypertensive medications as already ordered, these medications have been reviewed and there are no changes  at this time.   4. Type 2 diabetes mellitus with foot ulcer, with long-term current use of insulin (HCC) Continue hypoglycemic medications as already ordered, these medications have been reviewed and there are no changes at this time.  Hgb A1C to be monitored as already arranged by primary service   5. ESRD on dialysis [facility]) I had a long discussion with the patient as to why leaving in his PermCath was actually not the best decision long-term.  We discussed about the high rates of infection and the severity of infection that could be caused by leaving a PermCath.  We also discussed the possibility of central vein stenosis which over time could result in superior vena cava syndrome and a litany of other issues.  The patient agreed to at least have his vein mapping and we will discuss what fistula and/or graft placement looks like at the patient's next visit. - VAS [place] UPPER EXTREMITY VEIN MAPPING; Future   Current Outpatient Medications on File Prior to Visit  Medication Sig Dispense Refill  . allopurinol (ZYLOPRIM) 100 MG tablet Take 1 tablet (100 mg total) by mouth daily. 30 tablet 0  . amiodarone (PACERONE) 100 MG tablet Take 100 mg by mouth daily.     Marland Kitchen aspirin EC 81 MG tablet Take 1 tablet (81 mg total) by mouth daily. 90 tablet 3  . carvedilol (COREG) 3.125 MG tablet Take 1 tablet (3.125 mg total) by mouth 2 (two) times daily. 60 tablet  0  . clopidogrel (PLAVIX) 75 MG tablet Take 1 tablet (75 mg total) by mouth daily with breakfast. 30 tablet 0  . dicyclomine (BENTYL) 10 MG capsule Take 1 capsule (10 mg total) by mouth 4 (four) times daily -  before meals and at bedtime. 120 capsule 0  . ferrous sulfate 325 (65 FE) MG tablet Take 1 tablet (325 mg total) by mouth 2 (two) times daily with a meal.  3  . gabapentin (NEURONTIN) 100 MG capsule Take 1 capsule (100 mg total) by mouth 3 (three) times daily. 90 capsule 0  . loperamide (IMODIUM) 2 MG capsule Take 1 capsule (2 mg total) by mouth  every 8 (eight) hours as needed for diarrhea or loose stools. 10 capsule 0  . predniSONE (DELTASONE) 5 MG tablet Take 1 tablet (5 mg total) by mouth daily with breakfast. 40 tablet 0  . calcium acetate (PHOSLO) 667 MG capsule Take 1,334 mg by mouth 3 (three) times daily.     No current facility-administered medications on file prior to visit.     There are no Patient Instructions on file for this visit. No follow-ups on file.   Kris Hartmann, NP  This note was completed with Sales executive.  Any errors are purely unintentional.

## 2019-01-24 DIAGNOSIS — L97819 Non-pressure chronic ulcer of other part of right lower leg with unspecified severity: Secondary | ICD-10-CM | POA: Diagnosis not present

## 2019-01-25 DIAGNOSIS — N2581 Secondary hyperparathyroidism of renal origin: Secondary | ICD-10-CM | POA: Diagnosis not present

## 2019-01-25 DIAGNOSIS — N186 End stage renal disease: Secondary | ICD-10-CM | POA: Diagnosis not present

## 2019-01-25 DIAGNOSIS — D631 Anemia in chronic kidney disease: Secondary | ICD-10-CM | POA: Diagnosis not present

## 2019-01-25 DIAGNOSIS — D509 Iron deficiency anemia, unspecified: Secondary | ICD-10-CM | POA: Diagnosis not present

## 2019-01-26 DIAGNOSIS — N2581 Secondary hyperparathyroidism of renal origin: Secondary | ICD-10-CM | POA: Diagnosis not present

## 2019-01-26 DIAGNOSIS — D509 Iron deficiency anemia, unspecified: Secondary | ICD-10-CM | POA: Diagnosis not present

## 2019-01-26 DIAGNOSIS — D631 Anemia in chronic kidney disease: Secondary | ICD-10-CM | POA: Diagnosis not present

## 2019-01-26 DIAGNOSIS — N186 End stage renal disease: Secondary | ICD-10-CM | POA: Diagnosis not present

## 2019-01-28 DIAGNOSIS — N186 End stage renal disease: Secondary | ICD-10-CM | POA: Diagnosis not present

## 2019-01-28 DIAGNOSIS — N2581 Secondary hyperparathyroidism of renal origin: Secondary | ICD-10-CM | POA: Diagnosis not present

## 2019-01-28 DIAGNOSIS — D509 Iron deficiency anemia, unspecified: Secondary | ICD-10-CM | POA: Diagnosis not present

## 2019-01-28 DIAGNOSIS — D631 Anemia in chronic kidney disease: Secondary | ICD-10-CM | POA: Diagnosis not present

## 2019-01-31 DIAGNOSIS — D509 Iron deficiency anemia, unspecified: Secondary | ICD-10-CM | POA: Diagnosis not present

## 2019-01-31 DIAGNOSIS — N2581 Secondary hyperparathyroidism of renal origin: Secondary | ICD-10-CM | POA: Diagnosis not present

## 2019-01-31 DIAGNOSIS — N186 End stage renal disease: Secondary | ICD-10-CM | POA: Diagnosis not present

## 2019-01-31 DIAGNOSIS — D631 Anemia in chronic kidney disease: Secondary | ICD-10-CM | POA: Diagnosis not present

## 2019-01-31 DIAGNOSIS — L89620 Pressure ulcer of left heel, unstageable: Secondary | ICD-10-CM | POA: Diagnosis not present

## 2019-02-01 DIAGNOSIS — Z992 Dependence on renal dialysis: Secondary | ICD-10-CM | POA: Diagnosis not present

## 2019-02-01 DIAGNOSIS — N186 End stage renal disease: Secondary | ICD-10-CM | POA: Diagnosis not present

## 2019-02-02 DIAGNOSIS — N186 End stage renal disease: Secondary | ICD-10-CM | POA: Diagnosis not present

## 2019-02-02 DIAGNOSIS — R52 Pain, unspecified: Secondary | ICD-10-CM | POA: Diagnosis not present

## 2019-02-02 DIAGNOSIS — E1129 Type 2 diabetes mellitus with other diabetic kidney complication: Secondary | ICD-10-CM | POA: Diagnosis not present

## 2019-02-02 DIAGNOSIS — I739 Peripheral vascular disease, unspecified: Secondary | ICD-10-CM | POA: Diagnosis not present

## 2019-02-02 DIAGNOSIS — I679 Cerebrovascular disease, unspecified: Secondary | ICD-10-CM | POA: Diagnosis not present

## 2019-02-03 DIAGNOSIS — K589 Irritable bowel syndrome without diarrhea: Secondary | ICD-10-CM | POA: Diagnosis not present

## 2019-02-03 DIAGNOSIS — M109 Gout, unspecified: Secondary | ICD-10-CM | POA: Diagnosis not present

## 2019-02-03 DIAGNOSIS — N189 Chronic kidney disease, unspecified: Secondary | ICD-10-CM | POA: Diagnosis not present

## 2019-02-03 DIAGNOSIS — I509 Heart failure, unspecified: Secondary | ICD-10-CM | POA: Diagnosis not present

## 2019-02-07 DIAGNOSIS — N186 End stage renal disease: Secondary | ICD-10-CM | POA: Diagnosis not present

## 2019-02-07 DIAGNOSIS — Z992 Dependence on renal dialysis: Secondary | ICD-10-CM | POA: Diagnosis not present

## 2019-02-08 ENCOUNTER — Other Ambulatory Visit (INDEPENDENT_AMBULATORY_CARE_PROVIDER_SITE_OTHER): Payer: Medicare Other

## 2019-02-08 ENCOUNTER — Ambulatory Visit (INDEPENDENT_AMBULATORY_CARE_PROVIDER_SITE_OTHER): Payer: Medicare Other | Admitting: Nurse Practitioner

## 2019-02-08 ENCOUNTER — Encounter (INDEPENDENT_AMBULATORY_CARE_PROVIDER_SITE_OTHER): Payer: Medicare Other

## 2019-02-08 DIAGNOSIS — D631 Anemia in chronic kidney disease: Secondary | ICD-10-CM | POA: Diagnosis not present

## 2019-02-08 DIAGNOSIS — N2581 Secondary hyperparathyroidism of renal origin: Secondary | ICD-10-CM | POA: Diagnosis not present

## 2019-02-08 DIAGNOSIS — N186 End stage renal disease: Secondary | ICD-10-CM | POA: Diagnosis not present

## 2019-02-08 DIAGNOSIS — D509 Iron deficiency anemia, unspecified: Secondary | ICD-10-CM | POA: Diagnosis not present

## 2019-02-08 DIAGNOSIS — Z992 Dependence on renal dialysis: Secondary | ICD-10-CM | POA: Diagnosis not present

## 2019-02-10 DIAGNOSIS — D631 Anemia in chronic kidney disease: Secondary | ICD-10-CM | POA: Diagnosis not present

## 2019-02-10 DIAGNOSIS — Z992 Dependence on renal dialysis: Secondary | ICD-10-CM | POA: Diagnosis not present

## 2019-02-10 DIAGNOSIS — N186 End stage renal disease: Secondary | ICD-10-CM | POA: Diagnosis not present

## 2019-02-10 DIAGNOSIS — D509 Iron deficiency anemia, unspecified: Secondary | ICD-10-CM | POA: Diagnosis not present

## 2019-02-10 DIAGNOSIS — N2581 Secondary hyperparathyroidism of renal origin: Secondary | ICD-10-CM | POA: Diagnosis not present

## 2019-02-13 DIAGNOSIS — D631 Anemia in chronic kidney disease: Secondary | ICD-10-CM | POA: Diagnosis not present

## 2019-02-13 DIAGNOSIS — Z992 Dependence on renal dialysis: Secondary | ICD-10-CM | POA: Diagnosis not present

## 2019-02-13 DIAGNOSIS — N186 End stage renal disease: Secondary | ICD-10-CM | POA: Diagnosis not present

## 2019-02-13 DIAGNOSIS — D509 Iron deficiency anemia, unspecified: Secondary | ICD-10-CM | POA: Diagnosis not present

## 2019-02-13 DIAGNOSIS — N2581 Secondary hyperparathyroidism of renal origin: Secondary | ICD-10-CM | POA: Diagnosis not present

## 2019-02-15 DIAGNOSIS — Z992 Dependence on renal dialysis: Secondary | ICD-10-CM | POA: Diagnosis not present

## 2019-02-15 DIAGNOSIS — N186 End stage renal disease: Secondary | ICD-10-CM | POA: Diagnosis not present

## 2019-02-15 DIAGNOSIS — D509 Iron deficiency anemia, unspecified: Secondary | ICD-10-CM | POA: Diagnosis not present

## 2019-02-15 DIAGNOSIS — N2581 Secondary hyperparathyroidism of renal origin: Secondary | ICD-10-CM | POA: Diagnosis not present

## 2019-02-15 DIAGNOSIS — D631 Anemia in chronic kidney disease: Secondary | ICD-10-CM | POA: Diagnosis not present

## 2019-02-17 DIAGNOSIS — Z992 Dependence on renal dialysis: Secondary | ICD-10-CM | POA: Diagnosis not present

## 2019-02-17 DIAGNOSIS — N186 End stage renal disease: Secondary | ICD-10-CM | POA: Diagnosis not present

## 2019-02-17 DIAGNOSIS — D509 Iron deficiency anemia, unspecified: Secondary | ICD-10-CM | POA: Diagnosis not present

## 2019-02-17 DIAGNOSIS — D631 Anemia in chronic kidney disease: Secondary | ICD-10-CM | POA: Diagnosis not present

## 2019-02-17 DIAGNOSIS — N2581 Secondary hyperparathyroidism of renal origin: Secondary | ICD-10-CM | POA: Diagnosis not present

## 2019-02-20 ENCOUNTER — Other Ambulatory Visit (INDEPENDENT_AMBULATORY_CARE_PROVIDER_SITE_OTHER): Payer: Self-pay | Admitting: Nurse Practitioner

## 2019-02-20 DIAGNOSIS — N186 End stage renal disease: Secondary | ICD-10-CM

## 2019-02-20 DIAGNOSIS — D631 Anemia in chronic kidney disease: Secondary | ICD-10-CM | POA: Diagnosis not present

## 2019-02-20 DIAGNOSIS — D509 Iron deficiency anemia, unspecified: Secondary | ICD-10-CM | POA: Diagnosis not present

## 2019-02-20 DIAGNOSIS — N2581 Secondary hyperparathyroidism of renal origin: Secondary | ICD-10-CM | POA: Diagnosis not present

## 2019-02-20 DIAGNOSIS — Z992 Dependence on renal dialysis: Secondary | ICD-10-CM | POA: Diagnosis not present

## 2019-02-21 ENCOUNTER — Other Ambulatory Visit: Payer: Self-pay

## 2019-02-21 ENCOUNTER — Ambulatory Visit (INDEPENDENT_AMBULATORY_CARE_PROVIDER_SITE_OTHER): Payer: Medicare Other

## 2019-02-21 ENCOUNTER — Encounter (INDEPENDENT_AMBULATORY_CARE_PROVIDER_SITE_OTHER): Payer: Self-pay | Admitting: Vascular Surgery

## 2019-02-21 ENCOUNTER — Ambulatory Visit (INDEPENDENT_AMBULATORY_CARE_PROVIDER_SITE_OTHER): Payer: Medicare Other | Admitting: Vascular Surgery

## 2019-02-21 VITALS — BP 131/67 | HR 96 | Resp 12 | Ht 70.0 in | Wt 195.0 lb

## 2019-02-21 DIAGNOSIS — Z992 Dependence on renal dialysis: Secondary | ICD-10-CM

## 2019-02-21 DIAGNOSIS — N186 End stage renal disease: Secondary | ICD-10-CM | POA: Diagnosis not present

## 2019-02-21 DIAGNOSIS — I10 Essential (primary) hypertension: Secondary | ICD-10-CM

## 2019-02-21 DIAGNOSIS — S88111A Complete traumatic amputation at level between knee and ankle, right lower leg, initial encounter: Secondary | ICD-10-CM

## 2019-02-21 DIAGNOSIS — I96 Gangrene, not elsewhere classified: Secondary | ICD-10-CM

## 2019-02-21 NOTE — Assessment & Plan Note (Signed)
Wound essentially healed at this point.  We can refer him to biotech for stump shrinker and prosthesis evaluation.

## 2019-02-21 NOTE — Assessment & Plan Note (Signed)
Now s/p right BKA. 

## 2019-02-21 NOTE — Assessment & Plan Note (Signed)
He has no usable superficial vein in either upper extremity on vein mapping today.  His arterial waveforms appear to be triphasic bilaterally. At this point, an AV graft in the upper extremity will be planned.  This can be done in either arm.  He prefers this to be done in his right arm as they can never find a place for an IV in the right arm and he would like to have his left arm for his regular IVs which is reasonable.  This will either be a loop forearm AV graft or a break ax AV graft depending on the anatomy encountered at the time of surgery

## 2019-02-21 NOTE — Assessment & Plan Note (Signed)
blood pressure control important in reducing the progression of atherosclerotic disease. On appropriate oral medications.  

## 2019-02-21 NOTE — Progress Notes (Signed)
Patient ID: Paul [name]., male   DOB: 1965/09/05, 53 y.o.   MRN: [id number]  Chief Complaint  Patient presents with  . Follow-up    HPI Paul [name]. is a 53 y.o. male.  Patient returns in follow-up.  He is about 2 months status post right below-knee amputation.  He is home now.  He has had issues with falls but that has passed in his wound is basically healed at this point.  We remove most of his staples and sutures today.  He is ready for a stump shrinker and a prosthesis evaluation at this point. He is also assessed for dialysis access today.  He has no usable superficial vein in either upper extremity on vein mapping today.  His arterial waveforms appear to be triphasic bilaterally.   Past Medical History:  Diagnosis Date  . Anemia   . Cardiac defibrillator in place    a. Biotronik LUmax 540 DRT, (ser # [id number]).  . Carotid arterial disease ([place])    a. s/p prior LICA stenting;  b. 12/996 Carotid U/S: 40-59% bilat ICA stenosis. Patent LICA stent.  . CHF (congestive heart failure) ([place])   . CKD (chronic kidney disease), stage III   . Coronary artery disease    a. 2010 s/p CABG x 3.  . DDD (degenerative disc disease), lumbosacral    L5-S1  . Diabetes ([place])    Lantus at bedtime  . Gangrene of toe of right foot ([place])   . Gout of left hand 10/06/2016  . HFrEF (heart failure with reduced ejection fraction) ([place])    a. 07/2016 Echo: EF 25-30%, diff HK, mild MR, mildly dil LA, mod reduced RV fxn, PASP 39mmHg.  Marland Kitchen Hypertension    takes Coreg daily  . Ischemic cardiomyopathy    a. 07/2016 Echo: EF 25-30%, diff HK.  . Myocardial infarction ([place]) 2009  . Peripheral vascular disease ([place])   . Sleep apnea    sleep study yr ago-unable to afford cpap  . Stroke [facility]) 09   no weakness    Past Surgical History:  Procedure Laterality Date  . AMPUTATION Right 12/23/2018   Procedure: AMPUTATION BELOW KNEE (Right);  Surgeon: Algernon Huxley, MD;  Location: [facility];  Service: General;   Laterality: Right;  . AMPUTATION TOE Right 08/22/2016   Procedure: AMPUTATION TOE;  Surgeon: Samara Deist, DPM;  Location: [facility];  Service: Podiatry;  Laterality: Right;  . AMPUTATION TOE Right 10/09/2016   Procedure: AMPUTATION TOE-RIGHT 2ND MPJ;  Surgeon: Samara Deist, DPM;  Location: [facility];  Service: Podiatry;  Laterality: Right;  . APLIGRAFT PLACEMENT Right 10/09/2016   Procedure: APLIGRAFT PLACEMENT;  Surgeon: Samara Deist, DPM;  Location: [facility];  Service: Podiatry;  Laterality: Right;  . CALCANEAL OSTEOTOMY Bilateral 07/28/2018   Procedure: RIGHT CALCANECTOMY BILATERAL DEBRIDEMENT OF ULCERS ON HEELS;  Surgeon: Albertine Patricia, DPM;  Location: [facility];  Service: Podiatry;  Laterality: Bilateral;  . CARDIAC DEFIBRILLATOR PLACEMENT  2011  . CAROTID ENDARTERECTOMY Left   . CHOLECYSTECTOMY  2010  . CORONARY ARTERY BYPASS GRAFT  2010   CABG x 3   . DIALYSIS/PERMA CATHETER INSERTION N/A 10/07/2018   Procedure: DIALYSIS/PERMA CATHETER INSERTION;  Surgeon: Katha Cabal, MD;  Location: [place] [facility];  Service: Cardiovascular;  Laterality: N/A;  . GRAFT APPLICATION Right 3/38/2505   Procedure: FULL THICKNESS SKIN GRAFT-RIGHT FOOT;  Surgeon: Algernon Huxley, MD;  Location: [facility];  Service: Vascular;  Laterality: Right;  .  HIP SURGERY Right 1994  . INCISION AND DRAINAGE Right 09/08/2018   Procedure: INCISION AND DRAINAGE - [place] OF DEFECTIVE SKIN, SOFT TISSUE AND BONE;  Surgeon: Albertine Patricia, DPM;  Location: [facility];  Service: Podiatry;  Laterality: Right;  . INCISION AND DRAINAGE OF WOUND Right 08/22/2016   Procedure: IRRIGATION AND DEBRIDEMENT WOUND and wound vac placement;  Surgeon: Samara Deist, DPM;  Location: [facility];  Service: Podiatry;  Laterality: Right;  . IRRIGATION AND DEBRIDEMENT ABSCESS Right 11/24/2018   Procedure: IRRIGATION AND DEBRIDEMENT ABSCESS RIGHT FOOT, COMPLICATED, DIABETIC;  Surgeon: Albertine Patricia, DPM;  Location: [facility];  Service:  Podiatry;  Laterality: Right;  . LOWER EXTREMITY ANGIOGRAPHY Right 08/24/2016   Procedure: Lower Extremity Angiography;  Surgeon: Algernon Huxley, MD;  Location: [place] [facility];  Service: Cardiovascular;  Laterality: Right;  . LOWER EXTREMITY ANGIOGRAPHY Right 07/27/2018   Procedure: RIGHT Lower Extremity Angiography;  Surgeon: Algernon Huxley, MD;  Location: [place] [facility];  Service: Cardiovascular;  Laterality: Right;  . LOWER EXTREMITY ANGIOGRAPHY Right 09/09/2018   Procedure: Lower Extremity Angiography;  Surgeon: Katha Cabal, MD;  Location: [place] [facility];  Service: Cardiovascular;  Laterality: Right;  . MASS EXCISION Right 07/18/2014   Procedure: EXCISION HETEROTOPIC BONE RIGHT HIP;  Surgeon: Frederik Pear, MD;  Location: [place];  Service: Orthopedics;  Laterality: Right;  . Open Heart Surgery  2010   x 3  . WOUND DEBRIDEMENT Right 10/09/2016   Procedure: DEBRIDEMENT WOUND;  Surgeon: Samara Deist, DPM;  Location: [facility];  Service: Podiatry;  Laterality: Right;      Allergies  Allergen Reactions  . Other Other (See Comments)    Cardiac Problems. Pt states he tolerates Toradol. Due to kidney and heart problems per pt  . Ibuprofen Other (See Comments)    Heart problems  . Baclofen Other (See Comments)  . Metformin Diarrhea  . Nsaids     Due to kidney and heart problems per pt    Current Outpatient Medications  Medication Sig Dispense Refill  . allopurinol (ZYLOPRIM) 100 MG tablet Take 1 tablet (100 mg total) by mouth daily. 30 tablet 0  . amiodarone (PACERONE) 100 MG tablet Take 100 mg by mouth daily.     Marland Kitchen aspirin EC 81 MG tablet Take 1 tablet (81 mg total) by mouth daily. 90 tablet 3  . calcium acetate (PHOSLO) 667 MG capsule Take 1,334 mg by mouth 3 (three) times daily.    . carvedilol (COREG) 3.125 MG tablet Take 1 tablet (3.125 mg total) by mouth 2 (two) times daily. 60 tablet 0  . cephALEXin (KEFLEX) 500 MG capsule Take 1 capsule (500 mg total) by mouth 2  (two) times daily. 20 capsule 0  . clopidogrel (PLAVIX) 75 MG tablet Take 1 tablet (75 mg total) by mouth daily with breakfast. 30 tablet 0  . dicyclomine (BENTYL) 10 MG capsule Take 1 capsule (10 mg total) by mouth 4 (four) times daily -  before meals and at bedtime. 120 capsule 0  . ferrous sulfate 325 (65 FE) MG tablet Take 1 tablet (325 mg total) by mouth 2 (two) times daily with a meal.  3  . gabapentin (NEURONTIN) 100 MG capsule Take 1 capsule (100 mg total) by mouth 3 (three) times daily. 90 capsule 0  . HYDROcodone-acetaminophen (NORCO) 5-325 MG tablet Take 1-2 tablets by mouth every 6 (six) hours as needed for moderate pain or severe pain. 50 tablet 0  . loperamide (IMODIUM) 2 MG  capsule Take 1 capsule (2 mg total) by mouth every 8 (eight) hours as needed for diarrhea or loose stools. 10 capsule 0  . predniSONE (DELTASONE) 5 MG tablet Take 1 tablet (5 mg total) by mouth daily with breakfast. 40 tablet 0   No current facility-administered medications for this visit.         Physical Exam BP 131/67 (BP Location: Right Arm, Patient Position: Sitting, Cuff Size: Normal)   Pulse 96   Resp 12   Ht 5\' 10"  (1.778 m)   Wt 195 lb (88.5 kg)   BMI 27.98 kg/m  Gen:  WD/WN, NAD Skin: incision C/D/I except for a slight opening medially.  No signs of infection.  Tissue appears healthy Heart: Regular rate and rhythm Lungs: Clear bilaterally with no labored respirations     Assessment/Plan:  HTN (hypertension) blood pressure control important in reducing the progression of atherosclerotic disease. On appropriate oral medications.   Gangrene of foot ([employer]) Now s/p right BKA  ESRD on dialysis [facility]) He has no usable superficial vein in either upper extremity on vein mapping today.  His arterial waveforms appear to be triphasic bilaterally. At this point, an AV graft in the upper extremity will be planned.  This can be done in either arm.  He prefers this to be done in his right arm as  they can never find a place for an IV in the right arm and he would like to have his left arm for his regular IVs which is reasonable.  This will either be a loop forearm AV graft or a break ax AV graft depending on the anatomy encountered at the time of surgery  Below-knee amputation of right lower extremity ([place]) Wound essentially healed at this point.  We can refer him to [facility]otech for stump shrinker and prosthesis evaluation.      Leotis Pain 02/21/2019, 3:26 PM   This note was created with Dragon medical transcription system.  Any errors from dictation are unintentional.

## 2019-02-22 ENCOUNTER — Telehealth (INDEPENDENT_AMBULATORY_CARE_PROVIDER_SITE_OTHER): Payer: Self-pay | Admitting: Nurse Practitioner

## 2019-02-22 DIAGNOSIS — Z992 Dependence on renal dialysis: Secondary | ICD-10-CM | POA: Diagnosis not present

## 2019-02-22 DIAGNOSIS — D631 Anemia in chronic kidney disease: Secondary | ICD-10-CM | POA: Diagnosis not present

## 2019-02-22 DIAGNOSIS — D509 Iron deficiency anemia, unspecified: Secondary | ICD-10-CM | POA: Diagnosis not present

## 2019-02-22 DIAGNOSIS — N186 End stage renal disease: Secondary | ICD-10-CM | POA: Diagnosis not present

## 2019-02-22 DIAGNOSIS — N2581 Secondary hyperparathyroidism of renal origin: Secondary | ICD-10-CM | POA: Diagnosis not present

## 2019-02-22 NOTE — Telephone Encounter (Signed)
Sonya with [facility] [phone]) calling requesting verbal order for wound care to continue using Calcium Alginate on stump and the change 3 x week for 2 weeks. Please advise. AS, CMA

## 2019-02-22 NOTE — Telephone Encounter (Signed)
That's fine

## 2019-02-22 NOTE — Telephone Encounter (Signed)
Paul [name] is aware of the below and verbalized understanding. AS< CMA

## 2019-02-24 DIAGNOSIS — D631 Anemia in chronic kidney disease: Secondary | ICD-10-CM | POA: Diagnosis not present

## 2019-02-24 DIAGNOSIS — D509 Iron deficiency anemia, unspecified: Secondary | ICD-10-CM | POA: Diagnosis not present

## 2019-02-24 DIAGNOSIS — N186 End stage renal disease: Secondary | ICD-10-CM | POA: Diagnosis not present

## 2019-02-24 DIAGNOSIS — Z992 Dependence on renal dialysis: Secondary | ICD-10-CM | POA: Diagnosis not present

## 2019-02-24 DIAGNOSIS — N2581 Secondary hyperparathyroidism of renal origin: Secondary | ICD-10-CM | POA: Diagnosis not present

## 2019-02-27 DIAGNOSIS — D509 Iron deficiency anemia, unspecified: Secondary | ICD-10-CM | POA: Diagnosis not present

## 2019-02-27 DIAGNOSIS — N186 End stage renal disease: Secondary | ICD-10-CM | POA: Diagnosis not present

## 2019-02-27 DIAGNOSIS — Z992 Dependence on renal dialysis: Secondary | ICD-10-CM | POA: Diagnosis not present

## 2019-02-27 DIAGNOSIS — D631 Anemia in chronic kidney disease: Secondary | ICD-10-CM | POA: Diagnosis not present

## 2019-02-27 DIAGNOSIS — N2581 Secondary hyperparathyroidism of renal origin: Secondary | ICD-10-CM | POA: Diagnosis not present

## 2019-03-03 DIAGNOSIS — D631 Anemia in chronic kidney disease: Secondary | ICD-10-CM | POA: Diagnosis not present

## 2019-03-03 DIAGNOSIS — N2581 Secondary hyperparathyroidism of renal origin: Secondary | ICD-10-CM | POA: Diagnosis not present

## 2019-03-03 DIAGNOSIS — Z992 Dependence on renal dialysis: Secondary | ICD-10-CM | POA: Diagnosis not present

## 2019-03-03 DIAGNOSIS — D509 Iron deficiency anemia, unspecified: Secondary | ICD-10-CM | POA: Diagnosis not present

## 2019-03-03 DIAGNOSIS — N186 End stage renal disease: Secondary | ICD-10-CM | POA: Diagnosis not present

## 2019-03-04 DIAGNOSIS — Z992 Dependence on renal dialysis: Secondary | ICD-10-CM | POA: Diagnosis not present

## 2019-03-04 DIAGNOSIS — N186 End stage renal disease: Secondary | ICD-10-CM | POA: Diagnosis not present

## 2019-03-06 DIAGNOSIS — D631 Anemia in chronic kidney disease: Secondary | ICD-10-CM | POA: Diagnosis not present

## 2019-03-06 DIAGNOSIS — Z992 Dependence on renal dialysis: Secondary | ICD-10-CM | POA: Diagnosis not present

## 2019-03-06 DIAGNOSIS — N2581 Secondary hyperparathyroidism of renal origin: Secondary | ICD-10-CM | POA: Diagnosis not present

## 2019-03-06 DIAGNOSIS — D509 Iron deficiency anemia, unspecified: Secondary | ICD-10-CM | POA: Diagnosis not present

## 2019-03-06 DIAGNOSIS — N186 End stage renal disease: Secondary | ICD-10-CM | POA: Diagnosis not present

## 2019-03-07 ENCOUNTER — Telehealth (INDEPENDENT_AMBULATORY_CARE_PROVIDER_SITE_OTHER): Payer: Self-pay

## 2019-03-07 DIAGNOSIS — I132 Hypertensive heart and chronic kidney disease with heart failure and with stage 5 chronic kidney disease, or end stage renal disease: Secondary | ICD-10-CM | POA: Diagnosis not present

## 2019-03-07 DIAGNOSIS — I255 Ischemic cardiomyopathy: Secondary | ICD-10-CM | POA: Diagnosis not present

## 2019-03-07 DIAGNOSIS — I2510 Atherosclerotic heart disease of native coronary artery without angina pectoris: Secondary | ICD-10-CM | POA: Diagnosis not present

## 2019-03-07 DIAGNOSIS — E1122 Type 2 diabetes mellitus with diabetic chronic kidney disease: Secondary | ICD-10-CM | POA: Diagnosis not present

## 2019-03-07 DIAGNOSIS — I509 Heart failure, unspecified: Secondary | ICD-10-CM | POA: Diagnosis not present

## 2019-03-07 DIAGNOSIS — Z89511 Acquired absence of right leg below knee: Secondary | ICD-10-CM | POA: Diagnosis not present

## 2019-03-07 DIAGNOSIS — Z4781 Encounter for orthopedic aftercare following surgical amputation: Secondary | ICD-10-CM | POA: Diagnosis not present

## 2019-03-07 DIAGNOSIS — I252 Old myocardial infarction: Secondary | ICD-10-CM | POA: Diagnosis not present

## 2019-03-07 NOTE — Telephone Encounter (Signed)
I attempted to contact the patient and a message was left for a return call. Patient is scheduled with Dr. Lucky Cowboy for surgery on 03/29/2019 and will do his pre-op and Covid testing on 03/23/2019 @ 9:00 am at the [facility]. I will mail the information out to the patient.

## 2019-03-08 DIAGNOSIS — D509 Iron deficiency anemia, unspecified: Secondary | ICD-10-CM | POA: Diagnosis not present

## 2019-03-08 DIAGNOSIS — D631 Anemia in chronic kidney disease: Secondary | ICD-10-CM | POA: Diagnosis not present

## 2019-03-08 DIAGNOSIS — N2581 Secondary hyperparathyroidism of renal origin: Secondary | ICD-10-CM | POA: Diagnosis not present

## 2019-03-08 DIAGNOSIS — Z992 Dependence on renal dialysis: Secondary | ICD-10-CM | POA: Diagnosis not present

## 2019-03-08 DIAGNOSIS — N186 End stage renal disease: Secondary | ICD-10-CM | POA: Diagnosis not present

## 2019-03-13 ENCOUNTER — Telehealth (INDEPENDENT_AMBULATORY_CARE_PROVIDER_SITE_OTHER): Payer: Self-pay

## 2019-03-13 DIAGNOSIS — Z992 Dependence on renal dialysis: Secondary | ICD-10-CM | POA: Diagnosis not present

## 2019-03-13 DIAGNOSIS — D631 Anemia in chronic kidney disease: Secondary | ICD-10-CM | POA: Diagnosis not present

## 2019-03-13 DIAGNOSIS — N186 End stage renal disease: Secondary | ICD-10-CM | POA: Diagnosis not present

## 2019-03-13 DIAGNOSIS — N2581 Secondary hyperparathyroidism of renal origin: Secondary | ICD-10-CM | POA: Diagnosis not present

## 2019-03-13 DIAGNOSIS — D509 Iron deficiency anemia, unspecified: Secondary | ICD-10-CM | POA: Diagnosis not present

## 2019-03-13 NOTE — Telephone Encounter (Signed)
Home health nurse called to informed the patient had two fall this week but no new open wounds. The nurse is coming in the home doing wound care with calcium alginate changing everyday. Home health visits are decreasing to once a week and wanted to know if the calcium alginate could be change 2 to 3 times a week with using foam dressing and not kerlix. The nurse will be coming into the home twice this week and will be teaching the patient how change his dressing. I spoke with Eulogio Ditch NP and she stated the calcium alginate with foam dressing should be change three times a week. The nurse has been aware with information and verbalized understanding.

## 2019-03-17 DIAGNOSIS — Z7689 Persons encountering health services in other specified circumstances: Secondary | ICD-10-CM | POA: Diagnosis not present

## 2019-03-17 DIAGNOSIS — Z89511 Acquired absence of right leg below knee: Secondary | ICD-10-CM | POA: Diagnosis not present

## 2019-03-17 DIAGNOSIS — Z0000 Encounter for general adult medical examination without abnormal findings: Secondary | ICD-10-CM | POA: Diagnosis not present

## 2019-03-17 DIAGNOSIS — E1140 Type 2 diabetes mellitus with diabetic neuropathy, unspecified: Secondary | ICD-10-CM | POA: Diagnosis not present

## 2019-03-17 DIAGNOSIS — Z8739 Personal history of other diseases of the musculoskeletal system and connective tissue: Secondary | ICD-10-CM | POA: Diagnosis not present

## 2019-03-17 DIAGNOSIS — Z01818 Encounter for other preprocedural examination: Secondary | ICD-10-CM | POA: Diagnosis not present

## 2019-03-17 DIAGNOSIS — Z992 Dependence on renal dialysis: Secondary | ICD-10-CM | POA: Diagnosis not present

## 2019-03-17 DIAGNOSIS — E1165 Type 2 diabetes mellitus with hyperglycemia: Secondary | ICD-10-CM | POA: Diagnosis not present

## 2019-03-17 DIAGNOSIS — I159 Secondary hypertension, unspecified: Secondary | ICD-10-CM | POA: Diagnosis not present

## 2019-03-17 DIAGNOSIS — D649 Anemia, unspecified: Secondary | ICD-10-CM | POA: Diagnosis not present

## 2019-03-17 DIAGNOSIS — L97429 Non-pressure chronic ulcer of left heel and midfoot with unspecified severity: Secondary | ICD-10-CM | POA: Diagnosis not present

## 2019-03-17 DIAGNOSIS — N186 End stage renal disease: Secondary | ICD-10-CM | POA: Diagnosis not present

## 2019-03-17 DIAGNOSIS — I2510 Atherosclerotic heart disease of native coronary artery without angina pectoris: Secondary | ICD-10-CM | POA: Diagnosis not present

## 2019-03-17 DIAGNOSIS — Z1331 Encounter for screening for depression: Secondary | ICD-10-CM | POA: Diagnosis not present

## 2019-03-17 DIAGNOSIS — G959 Disease of spinal cord, unspecified: Secondary | ICD-10-CM | POA: Diagnosis not present

## 2019-03-17 DIAGNOSIS — E11621 Type 2 diabetes mellitus with foot ulcer: Secondary | ICD-10-CM | POA: Diagnosis not present

## 2019-03-17 DIAGNOSIS — Z794 Long term (current) use of insulin: Secondary | ICD-10-CM | POA: Diagnosis not present

## 2019-03-17 DIAGNOSIS — E1122 Type 2 diabetes mellitus with diabetic chronic kidney disease: Secondary | ICD-10-CM | POA: Diagnosis not present

## 2019-03-17 DIAGNOSIS — M5412 Radiculopathy, cervical region: Secondary | ICD-10-CM | POA: Diagnosis not present

## 2019-03-20 DIAGNOSIS — D509 Iron deficiency anemia, unspecified: Secondary | ICD-10-CM | POA: Diagnosis not present

## 2019-03-20 DIAGNOSIS — Z992 Dependence on renal dialysis: Secondary | ICD-10-CM | POA: Diagnosis not present

## 2019-03-20 DIAGNOSIS — N186 End stage renal disease: Secondary | ICD-10-CM | POA: Diagnosis not present

## 2019-03-20 DIAGNOSIS — D631 Anemia in chronic kidney disease: Secondary | ICD-10-CM | POA: Diagnosis not present

## 2019-03-20 DIAGNOSIS — N2581 Secondary hyperparathyroidism of renal origin: Secondary | ICD-10-CM | POA: Diagnosis not present

## 2019-03-21 ENCOUNTER — Other Ambulatory Visit (INDEPENDENT_AMBULATORY_CARE_PROVIDER_SITE_OTHER): Payer: Self-pay | Admitting: Nurse Practitioner

## 2019-03-22 ENCOUNTER — Telehealth (INDEPENDENT_AMBULATORY_CARE_PROVIDER_SITE_OTHER): Payer: Self-pay

## 2019-03-22 ENCOUNTER — Encounter (INDEPENDENT_AMBULATORY_CARE_PROVIDER_SITE_OTHER): Payer: Self-pay

## 2019-03-22 DIAGNOSIS — D631 Anemia in chronic kidney disease: Secondary | ICD-10-CM | POA: Diagnosis not present

## 2019-03-22 DIAGNOSIS — N186 End stage renal disease: Secondary | ICD-10-CM | POA: Diagnosis not present

## 2019-03-22 DIAGNOSIS — Z992 Dependence on renal dialysis: Secondary | ICD-10-CM | POA: Diagnosis not present

## 2019-03-22 DIAGNOSIS — D509 Iron deficiency anemia, unspecified: Secondary | ICD-10-CM | POA: Diagnosis not present

## 2019-03-22 DIAGNOSIS — N2581 Secondary hyperparathyroidism of renal origin: Secondary | ICD-10-CM | POA: Diagnosis not present

## 2019-03-22 NOTE — Telephone Encounter (Signed)
I spoke with Paul [name] at  [facility] and the patient has been rescheduled with Dr. Lucky Cowboy for surgery on 04/13/2019 and pre-op and Covid testing on 04/10/2019 @ 12:45 pm at the [facility]. The pre-surgical instructions, dates and times will be faxed to [employer].

## 2019-03-23 ENCOUNTER — Inpatient Hospital Stay: Admission: RE | Admit: 2019-03-23 | Payer: Medicare Other | Source: Ambulatory Visit

## 2019-03-24 DIAGNOSIS — D509 Iron deficiency anemia, unspecified: Secondary | ICD-10-CM | POA: Diagnosis not present

## 2019-03-24 DIAGNOSIS — D631 Anemia in chronic kidney disease: Secondary | ICD-10-CM | POA: Diagnosis not present

## 2019-03-24 DIAGNOSIS — Z992 Dependence on renal dialysis: Secondary | ICD-10-CM | POA: Diagnosis not present

## 2019-03-24 DIAGNOSIS — N186 End stage renal disease: Secondary | ICD-10-CM | POA: Diagnosis not present

## 2019-03-24 DIAGNOSIS — N2581 Secondary hyperparathyroidism of renal origin: Secondary | ICD-10-CM | POA: Diagnosis not present

## 2019-03-27 DIAGNOSIS — N2581 Secondary hyperparathyroidism of renal origin: Secondary | ICD-10-CM | POA: Diagnosis not present

## 2019-03-27 DIAGNOSIS — D509 Iron deficiency anemia, unspecified: Secondary | ICD-10-CM | POA: Diagnosis not present

## 2019-03-27 DIAGNOSIS — Z992 Dependence on renal dialysis: Secondary | ICD-10-CM | POA: Diagnosis not present

## 2019-03-27 DIAGNOSIS — N186 End stage renal disease: Secondary | ICD-10-CM | POA: Diagnosis not present

## 2019-03-27 DIAGNOSIS — D631 Anemia in chronic kidney disease: Secondary | ICD-10-CM | POA: Diagnosis not present

## 2019-03-29 DIAGNOSIS — D631 Anemia in chronic kidney disease: Secondary | ICD-10-CM | POA: Diagnosis not present

## 2019-03-29 DIAGNOSIS — N186 End stage renal disease: Secondary | ICD-10-CM | POA: Diagnosis not present

## 2019-03-29 DIAGNOSIS — N2581 Secondary hyperparathyroidism of renal origin: Secondary | ICD-10-CM | POA: Diagnosis not present

## 2019-03-29 DIAGNOSIS — Z992 Dependence on renal dialysis: Secondary | ICD-10-CM | POA: Diagnosis not present

## 2019-03-29 DIAGNOSIS — D509 Iron deficiency anemia, unspecified: Secondary | ICD-10-CM | POA: Diagnosis not present

## 2019-04-03 DIAGNOSIS — Z992 Dependence on renal dialysis: Secondary | ICD-10-CM | POA: Diagnosis not present

## 2019-04-03 DIAGNOSIS — D631 Anemia in chronic kidney disease: Secondary | ICD-10-CM | POA: Diagnosis not present

## 2019-04-03 DIAGNOSIS — N2581 Secondary hyperparathyroidism of renal origin: Secondary | ICD-10-CM | POA: Diagnosis not present

## 2019-04-03 DIAGNOSIS — D509 Iron deficiency anemia, unspecified: Secondary | ICD-10-CM | POA: Diagnosis not present

## 2019-04-03 DIAGNOSIS — N186 End stage renal disease: Secondary | ICD-10-CM | POA: Diagnosis not present

## 2019-04-10 ENCOUNTER — Inpatient Hospital Stay: Admission: RE | Admit: 2019-04-10 | Payer: Medicare Other | Source: Ambulatory Visit

## 2019-04-10 DIAGNOSIS — E559 Vitamin D deficiency, unspecified: Secondary | ICD-10-CM | POA: Diagnosis not present

## 2019-04-10 DIAGNOSIS — R52 Pain, unspecified: Secondary | ICD-10-CM | POA: Diagnosis not present

## 2019-04-10 DIAGNOSIS — Z79899 Other long term (current) drug therapy: Secondary | ICD-10-CM | POA: Diagnosis not present

## 2019-04-10 DIAGNOSIS — M79603 Pain in arm, unspecified: Secondary | ICD-10-CM | POA: Diagnosis not present

## 2019-04-10 DIAGNOSIS — Z01818 Encounter for other preprocedural examination: Secondary | ICD-10-CM | POA: Diagnosis not present

## 2019-04-11 ENCOUNTER — Other Ambulatory Visit: Admission: RE | Admit: 2019-04-11 | Payer: Medicare Other | Source: Ambulatory Visit

## 2019-04-13 ENCOUNTER — Encounter: Admission: RE | Payer: Self-pay | Source: Home / Self Care

## 2019-04-13 ENCOUNTER — Ambulatory Visit: Admission: RE | Admit: 2019-04-13 | Payer: Medicare Other | Source: Home / Self Care | Admitting: Vascular Surgery

## 2019-04-13 SURGERY — INSERTION OF ARTERIOVENOUS (AV) GORE-TEX GRAFT ARM
Anesthesia: General | Laterality: Right

## 2019-04-18 ENCOUNTER — Ambulatory Visit (INDEPENDENT_AMBULATORY_CARE_PROVIDER_SITE_OTHER): Payer: Medicare Other | Admitting: Vascular Surgery

## 2019-04-19 NOTE — Progress Notes (Signed)
This encounter was created in error - please disregard.

## 2019-04-24 ENCOUNTER — Encounter (INDEPENDENT_AMBULATORY_CARE_PROVIDER_SITE_OTHER): Payer: Self-pay

## 2019-04-24 ENCOUNTER — Telehealth (INDEPENDENT_AMBULATORY_CARE_PROVIDER_SITE_OTHER): Payer: Self-pay

## 2019-04-24 NOTE — Telephone Encounter (Signed)
Mark from [employer] called wanting to reschedule the patient's surgery and have it faxed to the his attention at the dialysis center. Patient has been rescheduled to 05/10/2018 for surgery with Dr. Lucky Cowboy and will do pre-op over the phone on 05/02/2019 between 8-1 and covid testing on 05/09/19 between 12:30-2:30 pm at he [facility].

## 2019-04-25 ENCOUNTER — Encounter: Payer: Self-pay | Admitting: *Deleted

## 2019-04-25 ENCOUNTER — Other Ambulatory Visit: Payer: Self-pay | Admitting: *Deleted

## 2019-04-25 NOTE — Patient Outreach (Signed)
[facility]) Care Management [place] Telephone Outreach- self referral/ transportation needs   04/25/2019  Paul [name] June 18, 1965 [id number]  Successful telephone outreach attempt to Paul [name], 53 y/o male self referred to [place] CM as he was previously active with [facility] CM.  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  HIPAA/ identity verified with patient and [facility] CM services were discussed.  Patient provides verbal consent for [facility] CM involvement in his care.  Today, patient reports that he has been doing "fairly well" since his recent (R) BKA; states that he attended short-term rehabilitation which "helped learn how to move" with BKA.  When discharged home from SNF/ rehabilitation, he reports that the home he lives in with his room mate "isn't set up very good" for wheelchair use, and reports struggling around this as he tries to make sure his (R) BKA stump "continues healing well" so he can move forward to getting his prosthetic.  Reports several falls without injury post-SNF/ rehabilitation discharge.  States home health services have been in place and have already  been extended.  States that he now only has a bath aide for assistance with hygiene, and reports that he needs ongoing assistance in home for this.  Patient reports chronic pain in his hands bilaterally due to gout, which makes it difficult for him to use his hands.  Patient verbalizes ongoing issues around multiple community resource needs, including transportation/ housing for wheelchair accessibility/ need for personal care services through his medicaid benefit.  Patient further reports:  Medications: -- Has all medicationsand takes as prescribed;denies  questions/ concerns about current medications.  -- self-manage medications -- denies issues with swallowing medications -- declines medication review today- agreeable at time of next scheduled outreach  Home health [facility]) services: -- [facility] services for bath aide in place through [place] -- reports this service will not last much longer, as he has already had an extension of services and the RN came to assess/ re-certify this week and told him that he does not qualify for another extension, as he is doing so well; patient believes he will have once weekly visits from bath aide for "about another month," at which time he will not have bathing assistance, which he feels he will continue to need -- reports consistent participation in all other home health services and reports he is now able to change his own stump dressing and that he has learned a lot from home health PT  Provider appointments: -- All recent/ upcoming provider appointments were reviewed with patient today -- May 11, 2019: plans for outpatient AV graft to be placed -- verbalizes issues with transportation- see community resource needs below  [employer] Mobility/ Falls: -- reports several recent falls post-SNF/ rehabilitation discharge, due to "learning how to move" around his home, which he describes as "not very user friendly for wheelchairs."  Describes a particular area of concern getting into his bathroom, where he reports most of his falls have occurred; primarily uses wheelchair but transfers to walker to get into bathroom due to wheelchair not fitting in space -- assistive devices: wheelchair/ walker- rollator -- general fall risks/ prevention education discussed with patient today  Holiday representative needs: -- currently verbalizes multiple community resource needs (SDOH updated today for  depression/ transportation/ housing/ food insecurity); patient states he has current medicaid and for 2020 has enrolled in  [facility]/ [facility] dual plan ---- transportation: states that he has been using [employer] and has been told that this will run out soon; reports he does not know what to expect; states that the transportation through [place] is not reliable, and often causes him to arrive late for hemodialysis.  Used to use [employer], but can no longer use due to having BKA/ difficulty with mobility.  Reports that he is adherent to attending hemodialysis, and adds that the only times he has missed HD is due to not having reliable transportation ---- personal care services through medicaid-- has placed several phone calls to medicaid to set this up, and reports "no one ever answers the phone;" patient would like assistance in setting this benefit up through medicaid ---- housing resources: would like assistance in finding a home that is wheelchair accessible -- discussed with patient that I would place [facility] CSW referral and encouraged him to fully engage with CSW team once outreach is established.  Patient states that he "really needs their help" as his chronic hand pain makes it very difficult for him to fill out forms, etc.  Patient very frustrated that his efforts to contact medicaid have been unsuccessful and states he has not even been able to leave a message for the medicaid team.  Advanced Directive (AD) Planning:   --reports does not currently have exisisting AD in place and declines desire to create at this time; endorses that he is "full" resuscitation  Self-health management of ESRD, relatively new to hemodialysis/ recent (R) BKA/ DM: -- reports adherence to attending all provider appointments and HD sessions -- HD sessions on M/ W/ Friday; patient usually "home by 12:00 noon" -- reports stump from recent BKA, "healing great--" and that he has learned how to change his own dressings with home health team; denies issues around stump care- reports that he is hopefull that he will be able to move forward to obtain  prosthetic soon, states once that happens, and he gets necessary PT- he will be able to hopefully resume driving -- DM "under control;" states he was recently taken off of insulin due to his improvement in his A1-C and no longer has to monitor his blood sugars at home- positive reinforcement provided  Patient denies further issues, concerns, or problems today.  I provided/ confirmed that patient has my direct phone number, the main [facility] CM office phone number, and the [facility] CM 24-hour nurse advice phone number should issues arise prior to next scheduled [place] outreach after AV graft placement.  Encouraged patient to contact me directly if needs, questions, issues, or concerns arise prior to next scheduled outreach; patient agreed to do so.  Plan:  Patient will take medications as prescribed and will attend all scheduled provider appointments and hemodialysis sesisons  Patient will promptly notify care providers for any new concerns/ issues/ problems that arise  Patient will continue actively participating in home health bath aide services as long as this services is in place   Patient will continue using assistive devices for fall prevention  I will place [facility] CSW referral to address multiple community resource needs  I will make patient's PCP aware of [place] RN CM involvement in patient's care-- will send barriers letter  [place] CM outreach to continue with scheduled phone call post- outpatient graft placement  [facility] CM Care Plan Problem One  Most Recent Value  Care Plan Problem One  Multiple community resource needs in patient with ESRD new to hemodialysis and recent (R) BKA, as evidenced by patient reporting  Role Documenting the Problem One  Care Management Regino Ramirez for Problem One  Active  [facility] Long Term Goal   Over the next 90 days, patient will verbalize plan for obtaining prosthesis for (R) BKA, as evidenced by patient reporting during [facility] RN CM  outreach  [place] Term Goal Start Date  04/25/19  Interventions for Problem One Long Term Goal  Discussed with patient his recent surgical BKA and challenges with mobility and self-care,  discussed overall plan of care/ process for obtaining prosthetic leg,  confirmed that patient is adherent to hemodialysis and to attending provider appointments  [facility] CM Short Term Goal #1   Over the nexy 30 days, patient will discuss his multiple community resource needs with [facility] CSW, as evidenced by patient reporting and collaboration with [facility] CSW as indicated during [facility] RN CM outreach  [facility] CM Short Term Goal #1 Start Date  04/25/19  Interventions for Short Term Goal #1  Completed SDOH for multiple community resource needs verbalized by patient,  placed [facility] CSW referral and encouraged patient to fully engage with [facility] CSW team  [facility] CM Short Term Goal #2   Over the next 30 days, patient will continue actively participating with home health team while services are in place, as evidenced by patient reporting and collaboration with home health team as indicated during [place] outreach  [facility] CM Short Term Goal #2 Start Date  04/25/19  Interventions for Short Term Goal #2  Confirmed that patient has current home health team involved in care for bathing assistance,  discussed process for home health extension of services and confirmed that patient has been told he does not qualify for another extension of services,  discussed his medicaid benefits for personal care services and confirmed that patient has been attempting to contact medicaid office and case worker without success,  encouraged patient's ongoing active participation in home health services- placed [facility] CSW referral     I appreciate the opportunity to participate in Damiean's care,  Oneta Rack, RN, BSN, [employer] Coordinator [facility] Care Management  [phone]

## 2019-04-27 ENCOUNTER — Other Ambulatory Visit: Payer: Self-pay

## 2019-04-27 NOTE — Patient Outreach (Signed)
[name]  04/27/2019  Paul [name] Dec 14, 1965 [id number]   Social work referral received from [facility], Reginia Naas, on 04/26/19.  "Please place [facility] CSW referral for multiple community resource needs in patient with new (R) BKA on hemodialysis: Has current medicaid and for 2020 has enrolled in [facility]/ [facility] dual plan.  Patient has chronic hand pain and it is difficult for him to complete forms for resources. Unreliable transportation. Reports adherence to attending hemodialysisM-W-F, and adds that the only times he has missed HD is due to not having  reliable transportation. Personal care services through medicaid-- has placed several phone calls to medicaid to set this up, and reports 'noone ever answers the phone'.  Patient would like assistance in setting this benefit up through medicaid. Housing resources, would like assistance in finding a home that is wheelchair accessible"  Unsuccessful outreach to patient today.  Left voicemail message and mailed unsuccessful outreach letter.  Will attempt to reach again within four business days.   Ronn Melena, BSW Social Worker [phone]

## 2019-05-02 ENCOUNTER — Other Ambulatory Visit: Payer: Self-pay

## 2019-05-02 ENCOUNTER — Other Ambulatory Visit (INDEPENDENT_AMBULATORY_CARE_PROVIDER_SITE_OTHER): Payer: Self-pay | Admitting: Nurse Practitioner

## 2019-05-02 ENCOUNTER — Encounter
Admission: RE | Admit: 2019-05-02 | Discharge: 2019-05-02 | Disposition: A | Discharge status: Home / Self Care | Payer: Medicare Other | Source: Ambulatory Visit | Attending: Vascular Surgery | Admitting: Vascular Surgery

## 2019-05-02 NOTE — Patient Instructions (Signed)
Your procedure is scheduled on: May 11, 2019 THURSDAY Report to Day Surgery on the 2nd floor of the [facility]. To find out your arrival time, please call [phone] between 1PM - 3PM on: May 10, 2019 [facility]  REMEMBER: Instructions that are not followed completely may result in serious medical risk, up to and including death; or upon the discretion of your surgeon and anesthesiologist your surgery may need to be rescheduled.  Do not eat food after midnight the night before surgery.  No gum chewing, lozengers or hard candies.  You may however, drink CLEAR liquids up to 2 hours before you are scheduled to arrive for your surgery. Do not drink anything within 2 hours of the start of your surgery.  Clear liquids include: - water   Do NOT drink anything that is not on this list.  Type 1 and Type 2 diabetics should only drink water.  No Alcohol for 24 hours before or after surgery.  No Smoking including e-cigarettes for 24 hours prior to surgery.  No chewable tobacco products for at least 6 hours prior to surgery.  No nicotine patches on the day of surgery.  On the morning of surgery brush your teeth with toothpaste and water, you may rinse your mouth with mouthwash if you wish. Do not swallow any toothpaste or mouthwash.  Notify your doctor if there is any change in your medical condition (cold, fever, infection).  Do not wear jewelry, make-up, hairpins, clips or nail polish.  Do not wear lotions, powders, or perfumes.   Do not shave 48 hours prior to surgery.   Contacts and dentures may not be worn into surgery.  Do not bring valuables to the hospital, including drivers license, insurance or credit cards.  [place] is not responsible for any belongings or valuables.   TAKE THESE MEDICATIONS THE MORNING OF SURGERY: ALLOPURINOL AMIODARONE CARVEDILOL  GABAPENTIN PREDNISONE PAIN PILL IF NEEDED  Use CHG  wipes as directed on instruction sheet.  Follow  recommendations from Cardiologist, Pulmonologist or PCP regarding stopping Aspirin, Coumadin, Plavix, Eliquis, Pradaxa, or Pletal.  Stop Anti-inflammatories (NSAIDS) such as Advil, Aleve, Ibuprofen, Motrin, Naproxen, Naprosyn and Aspirin based products such as Excedrin, Goodys Powder, BC Powder. (May take Tylenol or Acetaminophen if needed.)  Stop ANY OVER THE COUNTER supplements until after surgery. (May continue Vitamin D, Vitamin B, and multivitamin.)  Wear comfortable clothing (specific to your surgery type) to the hospital.  Plan for stool softeners for home use.  If you are being admitted to the hospital overnight, leave your suitcase in the car. After surgery it may be brought to your room.  If you are being discharged the day of surgery, you will not be allowed to drive home. You will need a responsible adult to drive you home and stay with you that night.   Please call [phone] if you have any questions about these instructions.

## 2019-05-04 ENCOUNTER — Other Ambulatory Visit: Payer: Self-pay

## 2019-05-04 NOTE — Patient Outreach (Addendum)
[name]) Care Management  05/04/2019  Paul [name] June 02, 1965 [id number]   Social work referral received from [facility], Reginia Naas, on 04/26/19.  "Please place [facility] CSW referral for multiple community resource needs in patient with new (R) BKA on hemodialysis: Has current medicaid and for 2020 has enrolled in [facility]/ [facility] dual plan.  Patient has chronic hand pain and it is difficult for him to complete forms for resources. Unreliable transportation. Reports adherence to attending hemodialysisM-W-F, and adds that the only times he has missed HD is due to not having  reliable transportation. Personal care services through medicaid-- has placed several phone calls to medicaid to set this up, and reports 'noone ever answers the phone'.  Patient would like assistance in setting this benefit up through medicaid. Housing resources, would like assistance in finding a home that is wheelchair accessible"  Successful outreach to patient today.   Patient stated that most immediate need is for reliable transportation.  Currently uses [employer], however, states that he is often late for appointments.  Also stated that he is only eligible for 48 rides annually which will be exhausted quickly due to dialysis appointments.  Patient previously used [employer] but stopped using service because they were not coming to his door as they were supposed to.  Discussed Medicaid transportation, however, patient is unsure of the particular type of Medicaid that he has.  Stated that he has tried to call DSS many times to get clarification but never gets a return call.  Three way call conducted to [facility] transportation.  Left message with representative who stated that he should receive a return call.  Discussed Personal Care Services as well but patient must have Adult Medicaid in order to be assessed for service. At this time during the call, patient read a letter previously received from [place].  It seems  that he was temporarily approved for Long-Term Care Medicaid which expires in October.   Patient had to end call due to [facility] RN showing up for appointment.  Asked for call back later today.    Addendum: Called patient back this afternoon but had to leave a message.  Will call again next week if no return call is received before then.   Ronn Melena, BSW Social Worker [phone]

## 2019-05-08 ENCOUNTER — Other Ambulatory Visit: Payer: Self-pay

## 2019-05-08 ENCOUNTER — Ambulatory Visit: Payer: Self-pay

## 2019-05-08 NOTE — Patient Outreach (Signed)
[facility]) Care Management  05/08/2019  Paul [name]. [birth year] [id number]   Second unsuccessful attempt to follow up with patient regarding social work referral.  See note from 05/04/19.   Mailed unsuccessful outreach letter.  Will attempt to reach again within four business days.  Ronn Melena, BSW Social Worker [phone]

## 2019-05-09 ENCOUNTER — Ambulatory Visit: Payer: Self-pay

## 2019-05-09 ENCOUNTER — Other Ambulatory Visit: Payer: Medicare Other

## 2019-05-09 ENCOUNTER — Other Ambulatory Visit: Payer: Self-pay

## 2019-05-09 ENCOUNTER — Encounter
Admission: RE | Admit: 2019-05-09 | Discharge: 2019-05-09 | Disposition: A | Discharge status: Home / Self Care | Payer: Medicare Other | Source: Ambulatory Visit | Attending: Vascular Surgery | Admitting: Vascular Surgery

## 2019-05-09 DIAGNOSIS — Z20822 Contact with and (suspected) exposure to covid-19: Secondary | ICD-10-CM | POA: Insufficient documentation

## 2019-05-09 DIAGNOSIS — E7849 Other hyperlipidemia: Secondary | ICD-10-CM | POA: Insufficient documentation

## 2019-05-09 DIAGNOSIS — Z01812 Encounter for preprocedural laboratory examination: Secondary | ICD-10-CM | POA: Diagnosis present

## 2019-05-09 DIAGNOSIS — N186 End stage renal disease: Secondary | ICD-10-CM | POA: Diagnosis not present

## 2019-05-09 LAB — CBC WITH DIFFERENTIAL/PLATELET
Abs Immature Granulocytes: 0.29 10*3/uL — ABNORMAL HIGH (ref 0.00–0.07)
Basophils Absolute: 0 10*3/uL (ref 0.0–0.1)
Basophils Relative: 0 %
Eosinophils Absolute: 0 10*3/uL (ref 0.0–0.5)
Eosinophils Relative: 0 %
HCT: 38.3 % — ABNORMAL LOW (ref 39.0–52.0)
Hemoglobin: 11.9 g/dL — ABNORMAL LOW (ref 13.0–17.0)
Immature Granulocytes: 2 %
Lymphocytes Relative: 12 %
Lymphs Abs: 1.6 10*3/uL (ref 0.7–4.0)
MCH: 29 pg (ref 26.0–34.0)
MCHC: 31.1 g/dL (ref 30.0–36.0)
MCV: 93.4 fL (ref 80.0–100.0)
Monocytes Absolute: 0.8 10*3/uL (ref 0.1–1.0)
Monocytes Relative: 6 %
Neutro Abs: 10.4 10*3/uL — ABNORMAL HIGH (ref 1.7–7.7)
Neutrophils Relative %: 80 %
Platelets: 282 10*3/uL (ref 150–400)
RBC: 4.1 MIL/uL — ABNORMAL LOW (ref 4.22–5.81)
RDW: 15.5 % (ref 11.5–15.5)
WBC: 13.2 10*3/uL — ABNORMAL HIGH (ref 4.0–10.5)
nRBC: 0 % (ref 0.0–0.2)

## 2019-05-09 LAB — BASIC METABOLIC PANEL
Anion gap: 15 (ref 5–15)
BUN: 65 mg/dL — ABNORMAL HIGH (ref 6–20)
CO2: 19 mmol/L — ABNORMAL LOW (ref 22–32)
Calcium: 8.2 mg/dL — ABNORMAL LOW (ref 8.9–10.3)
Chloride: 101 mmol/L (ref 98–111)
Creatinine, Ser: 8.43 mg/dL — ABNORMAL HIGH (ref 0.61–1.24)
GFR calc Af Amer: 7 mL/min — ABNORMAL LOW (ref >60)
GFR calc non Af Amer: 6 mL/min — ABNORMAL LOW (ref >60)
Glucose, Bld: 123 mg/dL — ABNORMAL HIGH (ref 70–99)
Potassium: 4.1 mmol/L (ref 3.5–5.1)
Sodium: 135 mmol/L (ref 135–145)

## 2019-05-09 LAB — TYPE AND SCREEN
ABO/RH(D): AB POS
Antibody Screen: NEGATIVE

## 2019-05-09 LAB — SARS CORONAVIRUS 2 (TAT 6-24 HRS): SARS Coronavirus 2: NEGATIVE

## 2019-05-09 LAB — PROTIME-INR
INR: 1 (ref 0.8–1.2)
Prothrombin Time: 13.4 seconds (ref 11.4–15.2)

## 2019-05-09 LAB — APTT: aPTT: 29 seconds (ref 24–36)

## 2019-05-09 NOTE — Patient Outreach (Signed)
[place] [facility]) Care Management  05/09/2019  Paul [name]. 08-07-1965 [id number]   Third unsuccessful attempt to follow up with patient regarding social work referral.  See note from 05/04/19.   Mailed unsuccessful outreach letter.  Will close case if no response to letter or voicemail messages by 05/16/19.    Ronn Melena, BSW Social Worker [phone]

## 2019-05-10 MED ORDER — CEFAZOLIN SODIUM-DEXTROSE 1-4 GM/50ML-% IV SOLN
1.0000 g | INTRAVENOUS | Status: AC
  Administered 2019-05-11: 1 g via INTRAVENOUS

## 2019-05-11 ENCOUNTER — Other Ambulatory Visit: Payer: Self-pay

## 2019-05-11 ENCOUNTER — Ambulatory Visit: Payer: Medicare Other

## 2019-05-11 ENCOUNTER — Ambulatory Visit
Admission: RE | Admit: 2019-05-11 | Discharge: 2019-05-11 | Disposition: A | Discharge status: Home / Self Care | Payer: Medicare Other | Attending: Vascular Surgery | Admitting: Vascular Surgery

## 2019-05-11 ENCOUNTER — Encounter
Admission: RE | Disposition: A | Discharge status: Home / Self Care | Payer: Self-pay | Source: Home / Self Care | Attending: Vascular Surgery

## 2019-05-11 ENCOUNTER — Encounter: Payer: Self-pay | Admitting: Vascular Surgery

## 2019-05-11 ENCOUNTER — Ambulatory Visit: Payer: Medicare Other | Admitting: *Deleted

## 2019-05-11 DIAGNOSIS — Z95810 Presence of automatic (implantable) cardiac defibrillator: Secondary | ICD-10-CM | POA: Insufficient documentation

## 2019-05-11 DIAGNOSIS — Z794 Long term (current) use of insulin: Secondary | ICD-10-CM | POA: Diagnosis not present

## 2019-05-11 DIAGNOSIS — Z8673 Personal history of transient ischemic attack (TIA), and cerebral infarction without residual deficits: Secondary | ICD-10-CM | POA: Diagnosis not present

## 2019-05-11 DIAGNOSIS — I2510 Atherosclerotic heart disease of native coronary artery without angina pectoris: Secondary | ICD-10-CM | POA: Insufficient documentation

## 2019-05-11 DIAGNOSIS — I255 Ischemic cardiomyopathy: Secondary | ICD-10-CM | POA: Diagnosis not present

## 2019-05-11 DIAGNOSIS — G4730 Sleep apnea, unspecified: Secondary | ICD-10-CM | POA: Insufficient documentation

## 2019-05-11 DIAGNOSIS — E1151 Type 2 diabetes mellitus with diabetic peripheral angiopathy without gangrene: Secondary | ICD-10-CM | POA: Diagnosis not present

## 2019-05-11 DIAGNOSIS — Z89511 Acquired absence of right leg below knee: Secondary | ICD-10-CM | POA: Diagnosis not present

## 2019-05-11 DIAGNOSIS — Z886 Allergy status to analgesic agent status: Secondary | ICD-10-CM | POA: Insufficient documentation

## 2019-05-11 DIAGNOSIS — I509 Heart failure, unspecified: Secondary | ICD-10-CM | POA: Diagnosis not present

## 2019-05-11 DIAGNOSIS — Z951 Presence of aortocoronary bypass graft: Secondary | ICD-10-CM | POA: Insufficient documentation

## 2019-05-11 DIAGNOSIS — Z992 Dependence on renal dialysis: Secondary | ICD-10-CM | POA: Diagnosis not present

## 2019-05-11 DIAGNOSIS — E1122 Type 2 diabetes mellitus with diabetic chronic kidney disease: Secondary | ICD-10-CM | POA: Insufficient documentation

## 2019-05-11 DIAGNOSIS — N186 End stage renal disease: Secondary | ICD-10-CM | POA: Insufficient documentation

## 2019-05-11 DIAGNOSIS — Z419 Encounter for procedure for purposes other than remedying health state, unspecified: Secondary | ICD-10-CM

## 2019-05-11 DIAGNOSIS — I252 Old myocardial infarction: Secondary | ICD-10-CM | POA: Diagnosis not present

## 2019-05-11 DIAGNOSIS — S88111A Complete traumatic amputation at level between knee and ankle, right lower leg, initial encounter: Secondary | ICD-10-CM

## 2019-05-11 DIAGNOSIS — I132 Hypertensive heart and chronic kidney disease with heart failure and with stage 5 chronic kidney disease, or end stage renal disease: Secondary | ICD-10-CM | POA: Diagnosis present

## 2019-05-11 DIAGNOSIS — N185 Chronic kidney disease, stage 5: Secondary | ICD-10-CM

## 2019-05-11 HISTORY — PX: AV FISTULA PLACEMENT: SHX1204

## 2019-05-11 LAB — GLUCOSE, CAPILLARY
Glucose-Capillary: 143 mg/dL — ABNORMAL HIGH (ref 70–99)
Glucose-Capillary: 147 mg/dL — ABNORMAL HIGH (ref 70–99)

## 2019-05-11 LAB — PROTIME-INR
INR: 1.1 (ref 0.8–1.2)
Prothrombin Time: 13.9 seconds (ref 11.4–15.2)

## 2019-05-11 SURGERY — INSERTION OF ARTERIOVENOUS (AV) GORE-TEX GRAFT ARM
Anesthesia: General | Laterality: Right

## 2019-05-11 MED ORDER — HEPARIN SODIUM (PORCINE) 1000 UNIT/ML IJ SOLN
INTRAMUSCULAR | Status: DC | PRN
  Administered 2019-05-11: 3000 [IU] via INTRAVENOUS

## 2019-05-11 MED ORDER — CEFAZOLIN SODIUM-DEXTROSE 1-4 GM/50ML-% IV SOLN
INTRAVENOUS | Status: AC
  Filled 2019-05-11: qty 50

## 2019-05-11 MED ORDER — EPINEPHRINE PF 1 MG/ML IJ SOLN
INTRAMUSCULAR | Status: AC
  Filled 2019-05-11: qty 1

## 2019-05-11 MED ORDER — EPHEDRINE SULFATE 50 MG/ML IJ SOLN
INTRAMUSCULAR | Status: DC | PRN
  Administered 2019-05-11 (×3): 5 mg via INTRAVENOUS
  Administered 2019-05-11: 10 mg via INTRAVENOUS

## 2019-05-11 MED ORDER — FENTANYL CITRATE (PF) 100 MCG/2ML IJ SOLN
INTRAMUSCULAR | Status: AC
  Filled 2019-05-11: qty 2

## 2019-05-11 MED ORDER — LIDOCAINE HCL (PF) 1 % IJ SOLN
INTRAMUSCULAR | Status: AC
  Filled 2019-05-11: qty 5

## 2019-05-11 MED ORDER — MIDAZOLAM HCL 2 MG/2ML IJ SOLN
INTRAMUSCULAR | Status: AC
  Filled 2019-05-11: qty 2

## 2019-05-11 MED ORDER — CHLORHEXIDINE GLUCONATE CLOTH 2 % EX PADS: 6.0000 | MEDICATED_PAD | Freq: Once | CUTANEOUS | Status: DC

## 2019-05-11 MED ORDER — HEPARIN SODIUM (PORCINE) 1000 UNIT/ML IJ SOLN
INTRAMUSCULAR | Status: AC
  Filled 2019-05-11: qty 2

## 2019-05-11 MED ORDER — PHENYLEPHRINE HCL (PRESSORS) 10 MG/ML IV SOLN
INTRAVENOUS | Status: DC | PRN
  Administered 2019-05-11 (×4): 100 ug via INTRAVENOUS
  Administered 2019-05-11: 50 ug via INTRAVENOUS
  Administered 2019-05-11: 100 ug via INTRAVENOUS
  Administered 2019-05-11: 50 ug via INTRAVENOUS

## 2019-05-11 MED ORDER — PROMETHAZINE HCL 25 MG/ML IJ SOLN: 6.2500 mg | INTRAMUSCULAR | Status: DC | PRN

## 2019-05-11 MED ORDER — SODIUM CHLORIDE 0.9 % IV SOLN
INTRAVENOUS | Status: DC | PRN
  Administered 2019-05-11: 200 mL via INTRAMUSCULAR

## 2019-05-11 MED ORDER — ACETAMINOPHEN 325 MG PO TABS: 325.0000 mg | ORAL_TABLET | ORAL | Status: DC | PRN

## 2019-05-11 MED ORDER — FENTANYL CITRATE (PF) 100 MCG/2ML IJ SOLN
INTRAMUSCULAR | Status: DC | PRN
  Administered 2019-05-11 (×2): 50 ug via INTRAVENOUS

## 2019-05-11 MED ORDER — PROPOFOL 10 MG/ML IV BOLUS
INTRAVENOUS | Status: AC
  Filled 2019-05-11: qty 20

## 2019-05-11 MED ORDER — HYDROMORPHONE HCL 1 MG/ML IJ SOLN: 1.0000 mg | Freq: Once | INTRAMUSCULAR | Status: DC | PRN

## 2019-05-11 MED ORDER — ONDANSETRON HCL 4 MG/2ML IJ SOLN
INTRAMUSCULAR | Status: AC
  Filled 2019-05-11: qty 2

## 2019-05-11 MED ORDER — ROCURONIUM BROMIDE 50 MG/5ML IV SOLN
INTRAVENOUS | Status: AC
  Filled 2019-05-11: qty 1

## 2019-05-11 MED ORDER — EPHEDRINE SULFATE 50 MG/ML IJ SOLN
INTRAMUSCULAR | Status: AC
  Filled 2019-05-11: qty 1

## 2019-05-11 MED ORDER — FENTANYL CITRATE (PF) 100 MCG/2ML IJ SOLN: 50.0000 ug | Freq: Once | INTRAMUSCULAR | Status: DC

## 2019-05-11 MED ORDER — FAMOTIDINE 20 MG PO TABS: 20.0000 mg | ORAL_TABLET | Freq: Once | ORAL | Status: AC

## 2019-05-11 MED ORDER — LIDOCAINE HCL (CARDIAC) PF 100 MG/5ML IV SOSY
PREFILLED_SYRINGE | INTRAVENOUS | Status: DC | PRN
  Administered 2019-05-11: 100 mg via INTRAVENOUS

## 2019-05-11 MED ORDER — HYDROCODONE-ACETAMINOPHEN 5-325 MG PO TABS: 1.0000 | ORAL_TABLET | Freq: Four times a day (QID) | ORAL | 0 refills | Status: DC | PRN

## 2019-05-11 MED ORDER — FAMOTIDINE 20 MG PO TABS
ORAL_TABLET | ORAL | Status: AC
  Administered 2019-05-11: 20 mg via ORAL
  Filled 2019-05-11: qty 1

## 2019-05-11 MED ORDER — ROPIVACAINE HCL 5 MG/ML IJ SOLN
INTRAMUSCULAR | Status: AC
  Filled 2019-05-11: qty 30

## 2019-05-11 MED ORDER — ONDANSETRON HCL 4 MG/2ML IJ SOLN: 4.0000 mg | Freq: Four times a day (QID) | INTRAMUSCULAR | Status: DC | PRN

## 2019-05-11 MED ORDER — "VISTASEAL 4 ML SINGLE DOSE KIT "
PACK | CUTANEOUS | Status: DC | PRN
  Administered 2019-05-11: 4 mL via TOPICAL

## 2019-05-11 MED ORDER — BUPIVACAINE HCL (PF) 0.5 % IJ SOLN
INTRAMUSCULAR | Status: AC
  Filled 2019-05-11: qty 30

## 2019-05-11 MED ORDER — MIDAZOLAM HCL 2 MG/2ML IJ SOLN: 1.0000 mg | Freq: Once | INTRAMUSCULAR | Status: DC

## 2019-05-11 MED ORDER — ACETAMINOPHEN 160 MG/5ML PO SOLN
325.0000 mg | ORAL | Status: DC | PRN
  Filled 2019-05-11: qty 20.3

## 2019-05-11 MED ORDER — SODIUM CHLORIDE 0.9 % IV SOLN: INTRAVENOUS | Status: DC

## 2019-05-11 MED ORDER — PROPOFOL 10 MG/ML IV BOLUS
INTRAVENOUS | Status: DC | PRN
  Administered 2019-05-11: 150 mg via INTRAVENOUS

## 2019-05-11 MED ORDER — HEPARIN SODIUM (PORCINE) 5000 UNIT/ML IJ SOLN
INTRAMUSCULAR | Status: AC
  Filled 2019-05-11: qty 1

## 2019-05-11 MED ORDER — ONDANSETRON HCL 4 MG/2ML IJ SOLN
INTRAMUSCULAR | Status: DC | PRN
  Administered 2019-05-11: 4 mg via INTRAVENOUS

## 2019-05-11 MED ORDER — PROPOFOL 500 MG/50ML IV EMUL
INTRAVENOUS | Status: AC
  Filled 2019-05-11: qty 50

## 2019-05-11 SURGICAL SUPPLY — 53 items
BAG DECANTER FOR FLEXI CONT (MISCELLANEOUS) ×3 IMPLANT
BLADE SURG SZ11 CARB STEEL (BLADE) ×3 IMPLANT
BOOT SUTURE AID YELLOW STND (SUTURE) ×3 IMPLANT
BRUSH SCRUB EZ  4% CHG (MISCELLANEOUS) ×2
BRUSH SCRUB EZ 4% CHG (MISCELLANEOUS) ×1 IMPLANT
CANISTER SUCT 1200ML W/VALVE (MISCELLANEOUS) ×3 IMPLANT
CHLORAPREP W/TINT 26 (MISCELLANEOUS) ×3 IMPLANT
CLIP SPRNG 6MM S-JAW DBL (CLIP) ×3
COVER WAND RF STERILE (DRAPES) ×3 IMPLANT
DECANTER SPIKE VIAL GLASS SM (MISCELLANEOUS) IMPLANT
DERMABOND ADVANCED (GAUZE/BANDAGES/DRESSINGS) ×2
DERMABOND ADVANCED .7 DNX12 (GAUZE/BANDAGES/DRESSINGS) ×1 IMPLANT
ELECT CAUTERY BLADE 6.4 (BLADE) ×3 IMPLANT
ELECT REM PT RETURN 9FT ADLT (ELECTROSURGICAL) ×3
ELECTRODE REM PT RTRN 9FT ADLT (ELECTROSURGICAL) ×1 IMPLANT
GLOVE BIO SURGEON STRL SZ7 (GLOVE) ×6 IMPLANT
GLOVE INDICATOR 7.5 STRL GRN (GLOVE) ×3 IMPLANT
GOWN STRL REUS W/ TWL LRG LVL3 (GOWN DISPOSABLE) ×1 IMPLANT
GOWN STRL REUS W/ TWL XL LVL3 (GOWN DISPOSABLE) ×2 IMPLANT
GOWN STRL REUS W/TWL LRG LVL3 (GOWN DISPOSABLE) ×2
GOWN STRL REUS W/TWL XL LVL3 (GOWN DISPOSABLE) ×4
GRAFT PROPATEN STD WALL 6X40 (Vascular Products) ×3 IMPLANT
HEMOSTAT SURGICEL 2X14 (HEMOSTASIS) ×3 IMPLANT
HEMOSTAT SURGICEL 2X3 (HEMOSTASIS) ×3 IMPLANT
IV NS 500ML (IV SOLUTION) ×2
IV NS 500ML BAXH (IV SOLUTION) ×1 IMPLANT
KIT TURNOVER KIT A (KITS) ×3 IMPLANT
LABEL OR SOLS (LABEL) ×3 IMPLANT
LOOP RED MAXI  1X406MM (MISCELLANEOUS) ×2
LOOP VESSEL MAXI 1X406 RED (MISCELLANEOUS) ×1 IMPLANT
LOOP VESSEL MINI 0.8X406 BLUE (MISCELLANEOUS) ×1 IMPLANT
LOOPS BLUE MINI 0.8X406MM (MISCELLANEOUS) ×2
NEEDLE FILTER BLUNT 18X 1/2SAF (NEEDLE) ×2
NEEDLE FILTER BLUNT 18X1 1/2 (NEEDLE) ×1 IMPLANT
NS IRRIG 500ML POUR BTL (IV SOLUTION) ×3 IMPLANT
PACK EXTREMITY ARMC (MISCELLANEOUS) ×3 IMPLANT
PAD PREP 24X41 OB/GYN DISP (PERSONAL CARE ITEMS) ×3 IMPLANT
SOLUTION CELL SAVER (CLIP) ×1 IMPLANT
STOCKINETTE STRL 4IN 9604848 (GAUZE/BANDAGES/DRESSINGS) ×3 IMPLANT
SUT GORETEX CV-6TTC-13 36IN (SUTURE) ×12 IMPLANT
SUT MNCRL AB 4-0 PS2 18 (SUTURE) ×3 IMPLANT
SUT PROLENE 6 0 BV (SUTURE) ×12 IMPLANT
SUT SILK 0 SH 30 (SUTURE) ×3 IMPLANT
SUT SILK 2 0 (SUTURE) ×2
SUT SILK 2-0 18XBRD TIE 12 (SUTURE) ×1 IMPLANT
SUT SILK 3 0 (SUTURE) ×2
SUT SILK 3-0 18XBRD TIE 12 (SUTURE) ×1 IMPLANT
SUT SILK 4 0 (SUTURE) ×2
SUT SILK 4-0 18XBRD TIE 12 (SUTURE) ×1 IMPLANT
SUT VIC AB 3-0 SH 27 (SUTURE) ×4
SUT VIC AB 3-0 SH 27X BRD (SUTURE) ×2 IMPLANT
SYR 20ML LL LF (SYRINGE) ×3 IMPLANT
SYR 3ML LL SCALE MARK (SYRINGE) ×3 IMPLANT

## 2019-05-11 NOTE — Anesthesia Procedure Notes (Signed)
Procedure Name: LMA Insertion Date/Time: 05/11/2019 3:56 PM Performed by: Aline Brochure, CRNA Pre-anesthesia Checklist: Patient identified, Patient being monitored, Timeout performed, Emergency Drugs available and Suction available Patient Re-evaluated:Patient Re-evaluated prior to induction Oxygen Delivery Method: Circle system utilized Preoxygenation: Pre-oxygenation with 100% oxygen Induction Type: IV induction Ventilation: Mask ventilation without difficulty LMA: LMA inserted LMA Size: 4.5 Tube type: Oral Number of attempts: 1 Placement Confirmation: positive ETCO2 and breath sounds checked- equal and bilateral Tube secured with: Tape Dental Injury: Teeth and Oropharynx as per pre-operative assessment

## 2019-05-11 NOTE — Progress Notes (Signed)
Per Carla Drape, [employer] rep, "Magnet can be placed over implanted cardiac device intraoperatively.  No need for interrogation or reprogramming postoperatively".

## 2019-05-11 NOTE — Discharge Instructions (Signed)
AMBULATORY SURGERY  DISCHARGE INSTRUCTIONS   1) The drugs that you were given will stay in your system until tomorrow so for the next 24 hours you should not:  A) Drive an automobile B) Make any legal decisions C) Drink any alcoholic beverage   2) You may resume regular meals tomorrow.  Today it is better to start with liquids and gradually work up to solid foods.  You may eat anything you prefer, but it is better to start with liquids, then soup and crackers, and gradually work up to solid foods.   3) Please notify your doctor immediately if you have any unusual bleeding, trouble breathing, redness and pain at the surgery site, drainage, fever, or pain not relieved by medication.    4) Additional Instructions:                                                     Please call for post op appointment       Please contact your physician with any problems or Same Day Surgery at [phone], Monday through Friday 6 am to 4 pm, or [place] at [facility] number at [phone].

## 2019-05-11 NOTE — Op Note (Signed)
[facility]  OPERATIVE NOTE   PROCEDURE:  Right upper arm brachial artery to axillary vein arteriovenous graft with 6 mm Propaten pTFE graft  PRE-OPERATIVE DIAGNOSIS: 1. end stage renal disease    POST-OPERATIVE DIAGNOSIS: same  SURGEON: Paul [name]  ASSISTANT(S): Hezzie Bump, PA-C  ANESTHESIA: general  ESTIMATED BLOOD LOSS: 25 cc  FINDING(S): 1. none  SPECIMEN(S):  None  INDICATIONS:   Paul [name]. is a 54 y.o. male who presents with end stage renal disease and need for permanent dialysis access.  He had no usable superficial vein seen on ultrasound.  Risk, benefits, and alternatives to access surgery were discussed.  The patient is aware the risks include but are not limited to: bleeding, infection, steal syndrome, nerve damage, ischemic monomelic neuropathy, failure to mature, and need for additional procedures.  The patient is aware of the risks and elects to proceed forward. An assistant was present during the procedure to help facilitate the exposure and expedite the procedure.  DESCRIPTION: After full informed written consent was obtained from the patient, the patient was brought back to the operating room and placed supine upon the operating table.  The patient was given IV antibiotics prior to proceeding.  After obtaining adequate sedation, the patient was prepped and draped in standard fashion for a right arm access procedure. The assistant provided retraction and mobilization to help facilitate exposure and expedite the procedure throughout the entire procedure.  This included following suture, using retractors, and optimizing lighting.  I turned my attention first to the antecubitum.   I made an incision over the brachial artery, and dissected down through the subcutaneous tissue to the fascia carefully and was able to dissect out the brachial artery.  The artery was about patent and of adequate size to support a graft.  It was controlled  proximally and distally with vessel loops and then I turned my attention to the high bicipital groove in the axilla.  I made an incision and dissected down through the subcutaneous tissue and fascia until I reached the axillary vein.  It was patent and adequate size for graft creation.  I then dissected this vein proximally and distal and prepared it for control with Bulldog clamps.  I took a Dietitian and dissected from the antecubital up to the axillary incision.  Then I delivered the 6 mm Propaten pTFE graft, through this metal tunneler and then pulled out the metal tunneler leaving the graft in place and making sure the line was up for orientation.  I then gave the patient 3000 units of heparin to gain some anticoagulation.  After waiting 3 minutes, I placed the brachial artery under tension proximally and distally with vessel loops, made an arteriotomy and extended it with a Potts scissor.  I sewed the graft to this arteriotomy with a running stitch of CV-6 suture.  At this point, then I completed the anastomosis in the usual fashion.  I released the vessel loops on the inflow and allowed the artery to decompress through the graft. There was good pulsatile bleeding through this graft.  I clamped the graft near its arterial anastomosis and sucked out all the blood in the graft and loaded the graft with heparinized saline.  At this point, I pulled the graft to appropriate length and reset my exposure of the axillary vein.  Then, I controlled the vein with Bulldog clamps.  An anterior wall venotomy was created with an 11 blade and extended with Leroy Sea  scissors.  I then spatulated the graft to facilitate an end-to- side anastomosis matching the arteriotomy.  In the process of spatulating, I cut the graft to appropriate length for this anastomosis.  This graft was sewn to the vein in an end-to-side configuration with a running CV-6 suture.  Prior to completing this anastomosis, I allowed the vein to back  bleed and then I also allowed the artery to bleed in an antegrade fashion.  I completed this anastomosis in the usual fashion, and then irrigated out the high bicipital exposure and then placed Surgicel and Evicel.  I then turned my attention back to the antecubitum.  There was pulsatile flow in the artery beyond the graft.   At this point, I washed out the antecubital incision. Surgicel and Evicel were then placed. There was no more active bleeding.  The subcutaneous tissue was reapproximated with a running stitch of 3-0 Vicryl.  The skin was then reapproximated with a running subcuticular 4-0 Monocryl.  The skin was then cleaned, dried, and Dermabond used to reinforce the skin closure.  We then turned our attention to the axillary incision.  The subcutaneous tissue was repaired with running stitch of 3-0 Vicryl.  The skin was then reapproximated with running subcuticular 4-0 Monocryl.  The skin was then cleaned, dried, and then the skin closure was reinforced with Dermabond.  The patient was then awakened from anesthesia and taken to the recovery room in stable condition having tolerated the procedure well.    COMPLICATIONS: None  CONDITION: Stable   Paul [name] 05/11/2019 5:21 PM   This note was created with Dragon Medical transcription system. Any errors in dictation are purely unintentional.

## 2019-05-11 NOTE — Anesthesia Preprocedure Evaluation (Addendum)
Anesthesia Evaluation   Patient awake    Reviewed: Allergy & Precautions, H&P , NPO status , reviewed documented beta blocker date and time   Airway Mallampati: II  TM Distance: >3 FB Neck ROM: full    Dental  (+) Upper Dentures, Lower Dentures   Pulmonary sleep apnea ,  Pt denies SA   Pulmonary exam normal        Cardiovascular hypertension, + CAD, + Past MI, + Peripheral Vascular Disease and +CHF  Normal cardiovascular exam+ Cardiac Defibrillator   05/2018 ECHO Study Conclusions  - Left ventricle: The cavity size was mildly dilated. Systolic   function was severely reduced. The estimated ejection fraction   was in the range of 25% to 30%. Diffuse hypokinesis. Akinesis of   the apical myocardium. Hypokinesis of the mid to apical anterior   and anteroseptal myocardium. Doppler parameters are consistent   with abnormal left ventricular relaxation (grade 1 diastolic   dysfunction). - Mitral valve: There was mild regurgitation. - Left atrium: The atrium was mildly dilated. - Right ventricle: Systolic function was normal. - Tricuspid valve: There was mild-moderate regurgitation. - Pulmonary arteries: Systolic pressure was mildly elevated. PA   peak pressure: 43 mm Hg (S).    Neuro/Psych CVA, No Residual Symptoms    GI/Hepatic neg GERD  ,  Endo/Other  diabetes  Renal/GU DialysisRenal diseaseDialysis done yesterday      Musculoskeletal  (+) Arthritis ,   Abdominal   Peds  Hematology  (+) Blood dyscrasia, anemia ,   Anesthesia Other Findings Past Medical History: No date: Anemia No date: Cardiac defibrillator in place     Comment:  a. Biotronik LUmax 540 DRT, (ser # [id number]). No date: Carotid arterial disease ([place])     Comment:  a. s/p prior LICA stenting;  b. 06/6832 Carotid U/S:               40-59% bilat ICA stenosis. Patent LICA stent. No date: CHF (congestive heart failure) (HCC) No date: CKD (chronic  kidney disease), stage III No date: Coronary artery disease     Comment:  a. 2010 s/p CABG x 3. No date: DDD (degenerative disc disease), lumbosacral     Comment:  L5-S1 No date: Diabetes [facility])     Comment:  Lantus at bedtime No date: Gangrene of toe of right foot ([place]) 10/06/2016: Gout of left hand No date: HFrEF (heart failure with reduced ejection fraction) ([place])     Comment:  a. 07/2016 Echo: EF 25-30%, diff HK, mild MR, mildly dil               LA, mod reduced RV fxn, PASP 19mmHg. No date: Hypertension     Comment:  takes Coreg daily No date: Ischemic cardiomyopathy     Comment:  a. 07/2016 Echo: EF 25-30%, diff HK. 2009: Myocardial infarction ([place]) No date: Peripheral vascular disease ([place]) No date: Sleep apnea     Comment:  sleep study yr ago-unable to afford cpap 09: Stroke [facility])     Comment:  no weakness  Past Surgical History: 12/23/2018: AMPUTATION; Right     Comment:  Procedure: AMPUTATION BELOW KNEE (Right);  Surgeon: Algernon Huxley, MD;  Location: [facility];  Service: General;                Laterality: Right; 08/22/2016: AMPUTATION TOE; Right     Comment:  Procedure: AMPUTATION TOE;  Surgeon: Larkin Ina  Vickki Muff, DPM;              Location: [facility];  Service: Podiatry;  Laterality:               Right; 10/09/2016: AMPUTATION TOE; Right     Comment:  Procedure: AMPUTATION TOE-RIGHT 2ND MPJ;  Surgeon:               Samara Deist, DPM;  Location: [facility];  Service:               Podiatry;  Laterality: Right; 10/09/2016: APLIGRAFT PLACEMENT; Right     Comment:  Procedure: APLIGRAFT PLACEMENT;  Surgeon: Samara Deist, DPM;  Location: [facility];  Service: Podiatry;                Laterality: Right; 07/28/2018: CALCANEAL OSTEOTOMY; Bilateral     Comment:  Procedure: RIGHT CALCANECTOMY BILATERAL DEBRIDEMENT OF               ULCERS ON HEELS;  Surgeon: Albertine Patricia, DPM;                Location: [facility];  Service: Podiatry;  Laterality:                Bilateral; 2011: CARDIAC DEFIBRILLATOR PLACEMENT No date: CAROTID ENDARTERECTOMY; Left 2010: CHOLECYSTECTOMY 2010: CORONARY ARTERY BYPASS GRAFT     Comment:  CABG x 3  10/07/2018: DIALYSIS/PERMA CATHETER INSERTION; N/A     Comment:  Procedure: DIALYSIS/PERMA CATHETER INSERTION;  Surgeon:               Katha Cabal, MD;  Location: [place] [facility];               Service: Cardiovascular;  Laterality: N/A; 3/66/2947: GRAFT APPLICATION; Right     Comment:  Procedure: FULL THICKNESS SKIN GRAFT-RIGHT FOOT;                Surgeon: Algernon Huxley, MD;  Location: [facility];  Service:              Vascular;  Laterality: Right; 1994: HIP SURGERY; Right 09/08/2018: INCISION AND DRAINAGE; Right     Comment:  Procedure: INCISION AND DRAINAGE - [place] OF               DEFECTIVE SKIN, SOFT TISSUE AND BONE;  Surgeon: Albertine Patricia, DPM;  Location: [facility];  Service: Podiatry;                Laterality: Right; 08/22/2016: INCISION AND DRAINAGE OF WOUND; Right     Comment:  Procedure: IRRIGATION AND DEBRIDEMENT WOUND and wound               vac placement;  Surgeon: Samara Deist, DPM;  Location:               [facility];  Service: Podiatry;  Laterality: Right; 11/24/2018: IRRIGATION AND DEBRIDEMENT ABSCESS; Right     Comment:  Procedure: IRRIGATION AND DEBRIDEMENT ABSCESS RIGHT               FOOT, COMPLICATED, DIABETIC;  Surgeon: Albertine Patricia,               DPM;  Location: [facility];  Service: Podiatry;  Laterality: Right; 08/24/2016: LOWER EXTREMITY ANGIOGRAPHY; Right     Comment:  Procedure: Lower Extremity Angiography;  Surgeon: Algernon Huxley, MD;  Location: [place] [facility];  Service:               Cardiovascular;  Laterality: Right; 07/27/2018: LOWER EXTREMITY ANGIOGRAPHY; Right     Comment:  Procedure: RIGHT Lower Extremity Angiography;  Surgeon:               Algernon Huxley, MD;  Location: [place] [facility];                Service:  Cardiovascular;  Laterality: Right; 09/09/2018: LOWER EXTREMITY ANGIOGRAPHY; Right     Comment:  Procedure: Lower Extremity Angiography;  Surgeon:               Katha Cabal, MD;  Location: [place] [facility];               Service: Cardiovascular;  Laterality: Right; 07/18/2014: MASS EXCISION; Right     Comment:  Procedure: EXCISION HETEROTOPIC BONE RIGHT HIP;                Surgeon: Frederik Pear, MD;  Location: [place];  Service:               Orthopedics;  Laterality: Right; 2010: Open Heart Surgery     Comment:  x 3 10/09/2016: WOUND DEBRIDEMENT; Right     Comment:  Procedure: DEBRIDEMENT WOUND;  Surgeon: Samara Deist,               DPM;  Location: [facility];  Service: Podiatry;                Laterality: Right;     Reproductive/Obstetrics                         Anesthesia Physical Anesthesia Plan  ASA: IV  Anesthesia Plan: General   Post-op Pain Management:  Regional for Post-op pain   Induction: Intravenous  PONV Risk Score and Plan: 2 and TIVA  Airway Management Planned: Nasal Cannula and Natural Airway  Additional Equipment:   Intra-op Plan:   Post-operative Plan:   Informed Consent: I have reviewed the patients History and Physical, chart, labs and discussed the procedure including the risks, benefits and alternatives for the proposed anesthesia with the patient or authorized representative who has indicated his/her understanding and acceptance.     Dental Advisory Given  Plan Discussed with: CRNA  Anesthesia Plan Comments:        Anesthesia Quick Evaluation

## 2019-05-11 NOTE — H&P (Signed)
[facility] Admission History & Physical  MRN : [id number]  Paul [name]. is a 54 y.o. (12-15-1965) male who presents with chief complaint of No chief complaint on file. Marland Kitchen  History of Present Illness: Patient presents today for right arm AV graft placement.  Was originally seen in the office back in October and a vein mapping showed no usable superficial vein.  Prefers right arm access.  No fevers or chills.  No current signs of infection.  Had a right below-knee amputation about 4 to 5 months ago now and that is healed.  Patient gets Monday, Wednesday, and Friday dialysis.  Has been using a catheter without issues.  Current Facility-Administered Medications  Medication Dose Route Frequency Provider Last Rate Last Admin  . 0.9 %  sodium chloride infusion   Intravenous Continuous Alvin Critchley, MD      . ceFAZolin (ANCEF) IVPB 1 g/50 mL premix  1 g Intravenous On Call to [place], [place], NP      . Chlorhexidine Gluconate Cloth 2 % PADS 6 each  6 each Topical Once Kris Hartmann, NP       And  . Chlorhexidine Gluconate Cloth 2 % PADS 6 each  6 each Topical Once Kris Hartmann, NP      . EPINEPHrine (ADRENALIN) 1 MG/ML           . famotidine (PEPCID) tablet 20 mg  20 mg Oral Once Alvin Critchley, MD      . fentaNYL (SUBLIMAZE) 100 MCG/2ML injection           . fentaNYL (SUBLIMAZE) injection 50 mcg  50 mcg Intravenous Once Martha Clan, MD      . lidocaine (PF) (XYLOCAINE) 1 % injection           . midazolam (VERSED) 2 MG/2ML injection           . midazolam (VERSED) injection 1 mg  1 mg Intravenous Once Martha Clan, MD      . ropivacaine (PF) 5 mg/mL (0.5%) (NAROPIN) 5 MG/ML injection             Past Medical History:  Diagnosis Date  . Anemia   . Cardiac defibrillator in place    a. Biotronik LUmax 540 DRT, (ser # [id number]).  . Carotid arterial disease ([place])    a. s/p prior LICA stenting;  b. 05/3084 Carotid U/S: 40-59% bilat ICA stenosis. Patent  LICA stent.  . CHF (congestive heart failure) ([place])   . CKD (chronic kidney disease), stage III   . Coronary artery disease    a. 2010 s/p CABG x 3.  . DDD (degenerative disc disease), lumbosacral    L5-S1  . Diabetes ([place])    Lantus at bedtime  . Gangrene of toe of right foot ([place])   . Gout of left hand 10/06/2016  . HFrEF (heart failure with reduced ejection fraction) ([place])    a. 07/2016 Echo: EF 25-30%, diff HK, mild MR, mildly dil LA, mod reduced RV fxn, PASP 66mmHg.  Marland Kitchen Hypertension    takes Coreg daily  . Ischemic cardiomyopathy    a. 07/2016 Echo: EF 25-30%, diff HK.  . Myocardial infarction ([place]) 2009  . Peripheral vascular disease ([place])   . Sleep apnea    sleep study yr ago-unable to afford cpap  . Stroke [facility]) 09   no weakness    Past Surgical History:  Procedure Laterality Date  . AMPUTATION Right 12/23/2018  Procedure: AMPUTATION BELOW KNEE (Right);  Surgeon: Algernon Huxley, MD;  Location: [facility];  Service: General;  Laterality: Right;  . AMPUTATION TOE Right 08/22/2016   Procedure: AMPUTATION TOE;  Surgeon: Samara Deist, DPM;  Location: [facility];  Service: Podiatry;  Laterality: Right;  . AMPUTATION TOE Right 10/09/2016   Procedure: AMPUTATION TOE-RIGHT 2ND MPJ;  Surgeon: Samara Deist, DPM;  Location: [facility];  Service: Podiatry;  Laterality: Right;  . APLIGRAFT PLACEMENT Right 10/09/2016   Procedure: APLIGRAFT PLACEMENT;  Surgeon: Samara Deist, DPM;  Location: [facility];  Service: Podiatry;  Laterality: Right;  . CALCANEAL OSTEOTOMY Bilateral 07/28/2018   Procedure: RIGHT CALCANECTOMY BILATERAL DEBRIDEMENT OF ULCERS ON HEELS;  Surgeon: Albertine Patricia, DPM;  Location: [facility];  Service: Podiatry;  Laterality: Bilateral;  . CARDIAC DEFIBRILLATOR PLACEMENT  2011  . CAROTID ENDARTERECTOMY Left   . CHOLECYSTECTOMY  2010  . CORONARY ARTERY BYPASS GRAFT  2010   CABG x 3   . DIALYSIS/PERMA CATHETER INSERTION N/A 10/07/2018   Procedure: DIALYSIS/PERMA CATHETER INSERTION;   Surgeon: Katha Cabal, MD;  Location: [place] [facility];  Service: Cardiovascular;  Laterality: N/A;  . GRAFT APPLICATION Right 5/99/3570   Procedure: FULL THICKNESS SKIN GRAFT-RIGHT FOOT;  Surgeon: Algernon Huxley, MD;  Location: [facility];  Service: Vascular;  Laterality: Right;  . HIP SURGERY Right 1994  . INCISION AND DRAINAGE Right 09/08/2018   Procedure: INCISION AND DRAINAGE - [place] OF DEFECTIVE SKIN, SOFT TISSUE AND BONE;  Surgeon: Albertine Patricia, DPM;  Location: [facility];  Service: Podiatry;  Laterality: Right;  . INCISION AND DRAINAGE OF WOUND Right 08/22/2016   Procedure: IRRIGATION AND DEBRIDEMENT WOUND and wound vac placement;  Surgeon: Samara Deist, DPM;  Location: [facility];  Service: Podiatry;  Laterality: Right;  . IRRIGATION AND DEBRIDEMENT ABSCESS Right 11/24/2018   Procedure: IRRIGATION AND DEBRIDEMENT ABSCESS RIGHT FOOT, COMPLICATED, DIABETIC;  Surgeon: Albertine Patricia, DPM;  Location: [facility];  Service: Podiatry;  Laterality: Right;  . LOWER EXTREMITY ANGIOGRAPHY Right 08/24/2016   Procedure: Lower Extremity Angiography;  Surgeon: Algernon Huxley, MD;  Location: [place] [facility];  Service: Cardiovascular;  Laterality: Right;  . LOWER EXTREMITY ANGIOGRAPHY Right 07/27/2018   Procedure: RIGHT Lower Extremity Angiography;  Surgeon: Algernon Huxley, MD;  Location: [place] [facility];  Service: Cardiovascular;  Laterality: Right;  . LOWER EXTREMITY ANGIOGRAPHY Right 09/09/2018   Procedure: Lower Extremity Angiography;  Surgeon: Katha Cabal, MD;  Location: [place] [facility];  Service: Cardiovascular;  Laterality: Right;  . MASS EXCISION Right 07/18/2014   Procedure: EXCISION HETEROTOPIC BONE RIGHT HIP;  Surgeon: Frederik Pear, MD;  Location: [place];  Service: Orthopedics;  Laterality: Right;  . Open Heart Surgery  2010   x 3  . WOUND DEBRIDEMENT Right 10/09/2016   Procedure: DEBRIDEMENT WOUND;  Surgeon: Samara Deist, DPM;  Location: [facility];  Service: Podiatry;   Laterality: Right;    Social History Social History   Tobacco Use  . Smoking status: Never Smoker  . Smokeless tobacco: Never Used  Substance Use Topics  . Alcohol use: No  . Drug use: No    Family History Family History  Problem Relation Age of Onset  . Hypertension Other   . Diabetes Other   . Diabetes Father   . Hyperlipidemia Father   . Hypertension Father   . Hyperlipidemia Sister   . Hypertension Sister   . Diabetes Sister   . Diabetes Brother   . Hypertension Brother   .  Hyperlipidemia Brother     Allergies  Allergen Reactions  . Other Other (See Comments)    Cardiac Problems. Pt states he tolerates Toradol. Due to kidney and heart problems per pt  . Ibuprofen Other (See Comments)    Heart problems  . Baclofen Other (See Comments)  . Metformin Diarrhea  . Nsaids     Due to kidney and heart problems per pt     REVIEW OF SYSTEMS (Negative unless checked)  Constitutional: [] Weight loss  [] Fever  [] Chills Cardiac: [] Chest pain   [] Chest pressure   [x] Palpitations   [] Shortness of breath when laying flat   [] Shortness of breath at rest   [x] Shortness of breath with exertion. Vascular:  [] Pain in legs with walking   [] Pain in legs at rest   [] Pain in legs when laying flat   [] Claudication   [] Pain in feet when walking  [] Pain in feet at rest  [] Pain in feet when laying flat   [] History of DVT   [] Phlebitis   [] Swelling in legs   [] Varicose veins   [] Non-healing ulcers Pulmonary:   [] Uses home oxygen   [] Productive cough   [] Hemoptysis   [] Wheeze  [] COPD   [] Asthma Neurologic:  [] Dizziness  [] Blackouts   [] Seizures   [] History of stroke   [] History of TIA  [] Aphasia   [] Temporary blindness   [] Dysphagia   [] Weakness or numbness in arms   [x] Weakness or numbness in legs Musculoskeletal:  [x] Arthritis   [] Joint swelling   [x] Joint pain   [] Low back pain Hematologic:  [] Easy bruising  [] Easy bleeding   [] Hypercoagulable state   [] Anemic   [] Hepatitis Gastrointestinal:  [] Blood in stool   [] Vomiting blood  [] Gastroesophageal reflux/heartburn   [] Difficulty swallowing. Genitourinary:  [x] Chronic kidney disease   [] Difficult urination  [] Frequent urination  [] Burning with urination   [] Blood in urine Skin:  [] Rashes   [] Ulcers   [] Wounds Psychological:  [] History of anxiety   []  History of major depression.  Physical Examination  Vitals:   05/11/19 1345  BP: 109/71  Pulse: 77  Resp: 16  Temp: 97.7 F (36.5 C)  TempSrc: Temporal  SpO2: 99%   There is no height or weight on file to calculate BMI. Gen: WD/WN, NAD Head: [place]/AT, No temporalis wasting.  Ear/Nose/Throat: Hearing grossly intact, nares w/o erythema or drainage, oropharynx w/o Erythema/Exudate,  Eyes: Conjunctiva clear, sclera non-icteric Neck: Trachea midline.  No JVD.  Pulmonary:  Good air movement, respirations not labored, no use of accessory muscles.  Cardiac: irregular Vascular:  Vessel Right Left  Radial Palpable Palpable   Musculoskeletal: M/S 5/5 throughout. Right BKA healed.  No deformity or atrophy.  Neurologic: Sensation grossly intact in extremities.  Symmetrical.  Speech is fluent. Motor exam as listed above. Psychiatric: Judgment intact, Mood & affect appropriate for pt's clinical situation. Dermatologic: No rashes or ulcers noted.  No cellulitis or open wounds.      CBC Lab Results  Component Value Date   WBC 13.2 (H) 05/09/2019   HGB 11.9 (L) 05/09/2019   HCT 38.3 (L) 05/09/2019   MCV 93.4 05/09/2019   PLT 282 05/09/2019    BMET    Component Value Date/Time   NA 135 05/09/2019 0831   NA 139 06/23/2016 1553   K 4.1 05/09/2019 0831   CL 101 05/09/2019 0831   CO2 19 (L) 05/09/2019 0831   GLUCOSE 123 (H) 05/09/2019 0831   BUN 65 (H) 05/09/2019 0831   BUN 46 (H) 06/23/2016 1553  CREATININE 8.43 (H) 05/09/2019 0831   CALCIUM 8.2 (L) 05/09/2019 0831   GFRNONAA 6 (L) 05/09/2019 0831   GFRAA 7 (L) 05/09/2019 0831   Estimated  Creatinine Clearance: 11.6 mL/min (A) (by C-G formula based on SCr of 8.43 mg/dL (H)).  COAG Lab Results  Component Value Date   INR 1.1 05/11/2019   INR 1.0 05/09/2019   INR 1.1 12/23/2018    Radiology [place] OR NERVE BLOCK-IMAGE ONLY [facility])  Result Date: 05/11/2019 There is no interpretation for this exam.  This order is for images obtained during a surgical procedure.  Please See "Surgeries" Tab for more information regarding the procedure.   [place] OR NERVE BLOCK-IMAGE ONLY [facility])  Result Date: 05/11/2019 There is no interpretation for this exam.  This order is for images obtained during a surgical procedure.  Please See "Surgeries" Tab for more information regarding the procedure.     Assessment/Plan 1. ESRD.  No expected usable superficial vein so planning a right brach-ax AVG.  Told patient that if cephalic vein was seen, AVF could be performed.  Risks and benefits are discussed and informed consent was obtained. 2.  Status post right below-knee amputation.  Now healed. 3. Diabetes. An underlying cause of his renal failure and blood glucose control important in reducing the progression of atherosclerotic disease. Also, involved in wound healing. On appropriate medications. 4.  HTN. Stable on outpatient medications and blood pressure control important in reducing the progression of atherosclerotic disease. On appropriate oral medications.    Leotis Pain, MD  05/11/2019 2:51 PM

## 2019-05-11 NOTE — Transfer of Care (Signed)
Immediate Anesthesia Transfer of Care Note  Patient: Paul [name].  Procedure(s) Performed: INSERTION OF ARTERIOVENOUS (AV) GORE-TEX GRAFT ARM (Right )  Patient Location: PACU  Anesthesia Type:General  Level of Consciousness: awake  Airway & Oxygen Therapy: Patient Spontanous Breathing and Patient connected to face mask oxygen  Post-op Assessment: Post -op Vital signs reviewed and stable  Post vital signs: stable  Last Vitals:  Vitals Value Taken Time  BP 113/75 05/11/19 1745  Temp    Pulse 78 05/11/19 1746  Resp 12 05/11/19 1746  SpO2 96 % 05/11/19 1746  Vitals shown include unvalidated device data.  Last Pain:  Vitals:   05/11/19 1345  TempSrc: Temporal  PainSc: 0-No pain         Complications: No apparent anesthesia complications

## 2019-05-11 NOTE — Anesthesia Postprocedure Evaluation (Signed)
Anesthesia Post Note  Patient: Paul [name].  Procedure(s) Performed: INSERTION OF ARTERIOVENOUS (AV) GORE-TEX GRAFT ARM (Right )  Patient location during evaluation: PACU Anesthesia Type: General Level of consciousness: awake and alert Pain management: pain level controlled Vital Signs Assessment: post-procedure vital signs reviewed and stable Respiratory status: spontaneous breathing and respiratory function stable Cardiovascular status: stable Anesthetic complications: no     Last Vitals:  Vitals:   05/11/19 1818 05/11/19 1820  BP:  (!) 149/92  Pulse: 83   Resp: 18   Temp: (!) 36.1 C   SpO2: 92%     Last Pain:  Vitals:   05/11/19 1818  TempSrc: Temporal  PainSc: 0-No pain                 KEPHART,WILLIAM K

## 2019-05-15 ENCOUNTER — Other Ambulatory Visit: Payer: Self-pay | Admitting: *Deleted

## 2019-05-15 ENCOUNTER — Encounter: Payer: Self-pay | Admitting: *Deleted

## 2019-05-15 NOTE — Patient Outreach (Signed)
[place] [facility]) Care Management [place] Telephone Outreach  05/15/2019  Paul [name] [birth year] [id number]  Successful telephone outreach to Paul [name], 54 y/o male self referred to [facility] CM as he was previously active with [facility] CM.  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  HIPAA/ identity verified with patient; patient reports today that he is doing "pretty good" and he denies pain and new/ recent falls today.  Patient sounds to be in no distress throughout phone call today.  Patient further reports:  Medications: -- Has all medicationsand takes as prescribed;denies questions/ concerns about current medications  -- continues self-managing medications -- medication review completed today; no concerns/ discrepancies identified  Medications Reviewed Today    Reviewed by Knox Royalty, RN (Registered Nurse) on 05/15/19 at 1200  Med List Status: <None>  Medication Order Taking? Sig Documenting Provider Last Dose Status Informant  allopurinol (ZYLOPRIM) 100 MG tablet [id number]  Take 1 tablet (100 mg total) by mouth daily. Fritzi Mandes, MD  Active Self  amiodarone (PACERONE) 100 MG tablet [id number]  Take 100 mg by mouth daily.  [provider]  Active Self           Med Note Luan Pulling, CHRISTINA   Tue Nov 22, 2018  2:16 PM)    aspirin EC 81 MG tablet [id number]  Take 1 tablet (81 mg total) by mouth daily. Theora Gianotti, NP  Active Self  calcium acetate (PHOSLO) 667 MG capsule [id number]  Take 1,334 mg by mouth 3 (three) times daily. [provider]  Active Self           Med Note Arby Barrette   Wed Dec 21, 2018  5:28 PM) New medication not purchased as of yet  carvedilol (COREG) 3.125  MG tablet [id number]  Take 1 tablet (3.125 mg total) by mouth 2 (two) times daily. Loletha Grayer, MD  Active Self           Med Note Luan Pulling, CHRISTINA   Tue Nov 22, 2018  2:16 PM)    cephALEXin (KEFLEX) 500 MG capsule [id number]  Take 1 capsule (500 mg total) by mouth 2 (two) times daily.  Patient not taking: Reported on 05/02/2019   Kris Hartmann, NP  Active   clopidogrel (PLAVIX) 75 MG tablet [id number]  Take 1 tablet (75 mg total) by mouth daily with breakfast.  Patient not taking: Reported on 05/02/2019   MayoPete Pelt, MD  Active Self  dicyclomine (BENTYL) 10 MG capsule [id number]  Take 1 capsule (10 mg total) by mouth 4 (four) times daily -  before meals and at bedtime. Loletha Grayer, MD  Active Self           Med Note Velva Harman Oct 31, 2018 12:22 PM) Patient reports taking before meals only- not at HS  ferrous sulfate 325 (65 FE) MG tablet [id number]  Take 1 tablet (325 mg total) by mouth 2 (two) times daily with a meal. Bettey Costa, MD  Active            Med Note (HALLAJI, VIOLET A   Tue May 02, 2019  3:22 PM) GET THIS AT DIALYSIS  gabapentin (NEURONTIN) 100 MG capsule [id number]  Take 1 capsule (100 mg total) by mouth  3 (three) times daily. Loletha Grayer, MD  Active Self  HYDROcodone-acetaminophen [facility]) 5-325 MG tablet [id number]  Take 1-2 tablets by mouth every 6 (six) hours as needed for moderate pain or severe pain. Algernon Huxley, MD  Active   loperamide (IMODIUM) 2 MG capsule [id number]  Take 1 capsule (2 mg total) by mouth every 8 (eight) hours as needed for diarrhea or loose stools. Loletha Grayer, MD  Active Self  predniSONE (DELTASONE) 5 MG tablet [id number]  Take 1 tablet (5 mg total) by mouth daily with breakfast. Fritzi Mandes, MD  Active Self         -- Home Health services for bath aide in remain place through [place]; however patient again states this services will be completed soon; endorses ongoing active participation with home health  services -- reviewed recent and upcoming provider appointments with patient; endorses adherence to attending all scheduled provider appointments; continues using established transportation services to attend appointments and hemodialysis sessions ---- attended outpatient procedure for AV graft placement on 05/11/2019: tolerated without problems ---- attended appointment 05/12/2019 to obtain new shrinker for BKA: reports to return in "about 3 weeks" for fitting of prosthesis  ---- reports adherence to attending established hemodialysis sessions; reports follows renal diet as instructed -- spoke with [facility] CSW team regarding stated need for transportation/ assistance with understanding of medicaid benefit; patient states that he has no further questions and states that he does not "have the right kind of" medicaid to allow him to have personal care services/ transportation; stated that he plans in the future to apply for the "right kind" of medicaid to receive these benefits, but adds that this may take him awhile, as he knows from previous experince that "it is a very slow process" and "will not help me now, when I need the services."  Encouraged him to maintain contact with [facility] CSW, however, he stated that he "doesn't see the need," as he already knows he does not qualify for services the he "currently needs"  Patient denies further issues, concerns, or problems today. I provided/ confirmed that patient hasmy direct phone number, the main [facility] CM office phone number, and the [facility] CM 24-hour nurse advice phone number should issues arise prior to next scheduled [place] outreach next month.  Encouraged patient to contact me directly if needs, questions, issues, or concerns arise prior to next scheduled outreach; patient agreed to do so.  Plan:  Patient will take medications as prescribed and will attend all scheduled provider appointments and hemodialysis sesisons  Patient will promptly notify  care providers for any new concerns/ issues/ problems that arise  Patient will continue actively participating in home health bath aide services as long as this services is in place   Patient will continue using assistive devices for fall prevention  I will share today's notes/ medication list/ and care plan with patient's PCP as [facility] CM initial assessment   [facility] CM outreach to continue with scheduled phone call next month  [facility] CM Care Plan Problem One     Most Recent Value  Care Plan Problem One  Multiple community resource needs in patient with ESRD new to hemodialysis and recent (R) BKA, as evidenced by patient reporting  Role Documenting the Problem One  Care Management Coordinator  Care Plan for Problem One  Active  [facility] Long Term Goal   Over the next 90 days, patient will verbalize plan for obtaining prosthesis for (R) BKA, as evidenced by  patient reporting during [facility] RN CM outreach  [facility] Long Term Goal Start Date  04/25/19  Interventions for Problem One Long Term Goal  Reviewed with patient recent visits for prosthesis planning,  confirmed that patient obtained new shrinker and has plan in place with care provider to move forward on obtaining prosthesis  [facility] CM Short Term Goal #1   Over the nexy 30 days, patient will discuss his multiple community resource needs with [facility] CSW, as evidenced by patient reporting and collaboration with [facility] CSW as indicated during [facility] RN CM outreach  [facility] CM Short Term Goal #1 Start Date  04/25/19  [facility] CM Short Term Goal #1 Met Date  05/15/19 [Goal Met]  Interventions for Short Term Goal #1  Confirmed that patient spoke with [facility] CSW team and confirmed that he has no questions/ ongoing needs,  reviewed commuity resources and insurance benefits available to patient for transportation  [facility] CM Short Term Goal #2   Over the next 30 days, patient will continue actively participating with home health team while services are in place, as evidenced by patient  reporting and collaboration with home health team as indicated during [place] outreach  [facility] CM Short Term Goal #2 Start Date  04/25/19  [facility] CM Short Term Goal #2 Met Date  05/15/19 [Goal met]  Interventions for Short Term Goal #2  Confirmed that patient continues working with home health team, but that services will be ending soon,  discussed patient's plans to apply for type of medicaid that allows him to have personal care services     Oneta Rack, RN, BSN, [employer] Coordinator [facility] Care Management  [phone]

## 2019-05-16 ENCOUNTER — Other Ambulatory Visit: Payer: Self-pay

## 2019-05-16 NOTE — Patient Outreach (Signed)
[place] [facility]) Care Management  05/16/2019  Paul [name]. 06/06/1965 [id number]   Social work case closure due to inability to maintain contact.  Ronn Melena, BSW Social Worker [phone]

## 2019-06-14 ENCOUNTER — Other Ambulatory Visit (INDEPENDENT_AMBULATORY_CARE_PROVIDER_SITE_OTHER): Payer: Self-pay | Admitting: Vascular Surgery

## 2019-06-14 DIAGNOSIS — N186 End stage renal disease: Secondary | ICD-10-CM

## 2019-06-15 ENCOUNTER — Encounter (INDEPENDENT_AMBULATORY_CARE_PROVIDER_SITE_OTHER): Payer: Medicare Other

## 2019-06-15 ENCOUNTER — Other Ambulatory Visit: Payer: Self-pay | Admitting: *Deleted

## 2019-06-15 ENCOUNTER — Ambulatory Visit (INDEPENDENT_AMBULATORY_CARE_PROVIDER_SITE_OTHER): Payer: Self-pay | Admitting: Nurse Practitioner

## 2019-06-15 ENCOUNTER — Encounter: Payer: Self-pay | Admitting: *Deleted

## 2019-06-15 NOTE — Patient Outreach (Addendum)
[place] [facility]) Care Management [place] Telephone Outreach   06/15/2019  Paul [name]. 08-Jan-1966 [id number]  Unsuccessful telephone outreach to Paul [name], 54 y/o maleselfref[facility] he was previously active with [facility] CM.Patient has history including, but not limited to,ESRD/ new to hemodialysis this year;HTN, CAD, CHF, Diabetes (controlled, A1C - 7.9), and osteomyelitis, with recent surgical (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  With call attempt today, was unable to leave patient HIPAA compliant voice mail message requesting return call back; received automated outgoing voice message stating that his voice mail box is full and will not accept messages.  12:00 pm: received return call from patient; HIPAA/ identity verified with patient; patient reports "doing pretty good" and he sounds to be in no distress throughout phone call today.  Patient further reports:  -- has attended regularly established hemodialysis as scheduled; has not missed any appointments; has been tolerating "fair;" reports intermittently feels nauseated during sessions  -- plans to obtain prosthesis for leg/ amputation tomorrow- patient very excited; reports to have home health PT post- prosthesis, however, also states that he would like to have in patient rehabilitation stay if possible; patient believes that he should qualify for this benefit based on his understanding of his current insurance benefits with medicaid/ [employer]-- discussed with patient process to have [place] form completed by ordering Paul [name]; patient was also encouraged to contact Paul [name] previously involved in his care for assistance if necessary and he confirms that he has contact information for [name]  -- continues using insurance transportation benefit through [facility], however, reports due to having so many transportation trips, mainly due to  hemodialysis, he has almost used up all of this benefit-- he states that he filed an appeal as advised by insurance Paul [name], but it was denies; patient frustrated, stating "they told me to appeal again-- but why should I appeal over and over when all they do is deny it?"  Verbalizes plans to resume using [employer] once he gets his prosthesis; also states that family members are assisting with transportation occasionally; denies need to speak with Paul [name] again around transportation benefits available.  -- no concerns around medications; continues to self- manage  -- home health now completed; with plans to resume post-prosthetic fitting tomorrow, unless he is able to be placed in short-term rehabilitation facility  Patient denies further issues, concerns, or problems today.  I confirmed that patient has my direct phone number, the main [facility] CM office phone number, and the [facility] CM 24-hour nurse advice phone number should issues arise prior to next scheduled [place] outreach.  Encouraged patient to contact me directly if needs, questions, issues, or concerns arise prior to next scheduled outreach; patient agreed to do so.  Plan:  Patient will take medications as prescribed and will attend all scheduled Delana Manganello appointmentsand hemodialysis sesisons  Patient will promptly notify care providers for any new concerns/ issues/ problems that arise  Patient will continue using assistive devices for fall prevention  [facility] CM outreach to continue with scheduled phone callnext month, unless indicated sooner  [facility] CM Care Plan Problem One     Most Recent Value  Care Plan Problem One  Multiple community resource needs in patient with ESRD new to hemodialysis and recent (R) BKA, as evidenced by patient reporting  Role Documenting the Problem One  Care Management [place] for Problem One  Active  [facility]  Long Term Goal   Over the next 90 days, patient will verbalize plan for  obtaining prosthesis for (R) BKA, as evidenced by patient reporting during [facility] RN CM outreach  [place] Term Goal Start Date  04/25/19  Interventions for Problem One Long Term Goal  Discussed with patient plans to obtain leg prosthesis tomorrow and confirmed that he has transportation to appointment,  discussed post- prosthesis rehabilitaion and discussed home health vs. in patient rehabilitation options/ process     Oneta Rack, RN, BSN, [place] Coordinator [facility] Care Management  [phone]

## 2019-06-20 ENCOUNTER — Ambulatory Visit: Payer: Self-pay | Admitting: *Deleted

## 2019-06-29 ENCOUNTER — Encounter: Payer: Self-pay | Admitting: Intensive Care

## 2019-06-29 ENCOUNTER — Emergency Department
Admission: EM | Admit: 2019-06-29 | Discharge: 2019-06-29 | Disposition: A | Discharge status: Home / Self Care | Payer: Medicare Other | Attending: Emergency Medicine | Admitting: Emergency Medicine

## 2019-06-29 ENCOUNTER — Other Ambulatory Visit: Payer: Self-pay

## 2019-06-29 DIAGNOSIS — Z89511 Acquired absence of right leg below knee: Secondary | ICD-10-CM | POA: Insufficient documentation

## 2019-06-29 DIAGNOSIS — Z95810 Presence of automatic (implantable) cardiac defibrillator: Secondary | ICD-10-CM | POA: Diagnosis not present

## 2019-06-29 DIAGNOSIS — E119 Type 2 diabetes mellitus without complications: Secondary | ICD-10-CM | POA: Insufficient documentation

## 2019-06-29 DIAGNOSIS — L089 Local infection of the skin and subcutaneous tissue, unspecified: Secondary | ICD-10-CM | POA: Diagnosis not present

## 2019-06-29 DIAGNOSIS — Z79899 Other long term (current) drug therapy: Secondary | ICD-10-CM | POA: Diagnosis not present

## 2019-06-29 DIAGNOSIS — Y69 Unspecified misadventure during surgical and medical care: Secondary | ICD-10-CM | POA: Diagnosis not present

## 2019-06-29 DIAGNOSIS — I5022 Chronic systolic (congestive) heart failure: Secondary | ICD-10-CM | POA: Diagnosis not present

## 2019-06-29 DIAGNOSIS — Z992 Dependence on renal dialysis: Secondary | ICD-10-CM | POA: Insufficient documentation

## 2019-06-29 DIAGNOSIS — N186 End stage renal disease: Secondary | ICD-10-CM | POA: Diagnosis not present

## 2019-06-29 DIAGNOSIS — T188XXA Foreign body in other parts of alimentary tract, initial encounter: Secondary | ICD-10-CM | POA: Diagnosis not present

## 2019-06-29 DIAGNOSIS — Z955 Presence of coronary angioplasty implant and graft: Secondary | ICD-10-CM | POA: Insufficient documentation

## 2019-06-29 DIAGNOSIS — I132 Hypertensive heart and chronic kidney disease with heart failure and with stage 5 chronic kidney disease, or end stage renal disease: Secondary | ICD-10-CM | POA: Insufficient documentation

## 2019-06-29 DIAGNOSIS — I2510 Atherosclerotic heart disease of native coronary artery without angina pectoris: Secondary | ICD-10-CM | POA: Insufficient documentation

## 2019-06-29 DIAGNOSIS — T82590A Other mechanical complication of surgically created arteriovenous fistula, initial encounter: Secondary | ICD-10-CM | POA: Diagnosis present

## 2019-06-29 DIAGNOSIS — Z7982 Long term (current) use of aspirin: Secondary | ICD-10-CM | POA: Diagnosis not present

## 2019-06-29 DIAGNOSIS — Z794 Long term (current) use of insulin: Secondary | ICD-10-CM | POA: Insufficient documentation

## 2019-06-29 DIAGNOSIS — Z7901 Long term (current) use of anticoagulants: Secondary | ICD-10-CM | POA: Diagnosis not present

## 2019-06-29 LAB — COMPREHENSIVE METABOLIC PANEL
ALT: 15 U/L (ref 0–44)
AST: 13 U/L — ABNORMAL LOW (ref 15–41)
Albumin: 3.7 g/dL (ref 3.5–5.0)
Alkaline Phosphatase: 93 U/L (ref 38–126)
Anion gap: 15 (ref 5–15)
BUN: 68 mg/dL — ABNORMAL HIGH (ref 6–20)
CO2: 20 mmol/L — ABNORMAL LOW (ref 22–32)
Calcium: 7.4 mg/dL — ABNORMAL LOW (ref 8.9–10.3)
Chloride: 104 mmol/L (ref 98–111)
Creatinine, Ser: 11.7 mg/dL — ABNORMAL HIGH (ref 0.61–1.24)
GFR calc Af Amer: 5 mL/min — ABNORMAL LOW (ref >60)
GFR calc non Af Amer: 4 mL/min — ABNORMAL LOW (ref >60)
Glucose, Bld: 144 mg/dL — ABNORMAL HIGH (ref 70–99)
Potassium: 4.1 mmol/L (ref 3.5–5.1)
Sodium: 139 mmol/L (ref 135–145)
Total Bilirubin: 0.5 mg/dL (ref 0.3–1.2)
Total Protein: 7.2 g/dL (ref 6.5–8.1)

## 2019-06-29 LAB — CBC
HCT: 39.1 % (ref 39.0–52.0)
Hemoglobin: 12.1 g/dL — ABNORMAL LOW (ref 13.0–17.0)
MCH: 29.9 pg (ref 26.0–34.0)
MCHC: 30.9 g/dL (ref 30.0–36.0)
MCV: 96.5 fL (ref 80.0–100.0)
Platelets: 270 10*3/uL (ref 150–400)
RBC: 4.05 MIL/uL — ABNORMAL LOW (ref 4.22–5.81)
RDW: 15 % (ref 11.5–15.5)
WBC: 14.2 10*3/uL — ABNORMAL HIGH (ref 4.0–10.5)
nRBC: 0 % (ref 0.0–0.2)

## 2019-06-29 NOTE — ED Notes (Signed)
Introduced self to pt. Pt inquiring about lab results. Explained they're back and EDP Paduchowski will update pt on them soon. Pt agreeable to this. When asked about pain pt stated "don't worry about it" and wouldn't state a number 0-10 for pain. Pt resting calmly in bed. Pt talking on personal phone with family.

## 2019-06-29 NOTE — ED Triage Notes (Addendum)
Skin graft on right arm around X1 month ago and reports he thinks it is infected. Also has wound on bottom of left foot that they think may be infected. Patient A&O x4. 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 Dialysis patient M,W,F X5-6 months. Acces in left chest. Skipped dialysis 06/28/19

## 2019-06-29 NOTE — ED Notes (Signed)
EMS transport requested to assist pt back home as pt has R BKA and personal wheelchair not present.

## 2019-06-29 NOTE — ED Notes (Signed)
Delay explained to pt. Pt cooperative and in good spirits.

## 2019-06-29 NOTE — ED Notes (Signed)
Pt given food tray at this time

## 2019-06-29 NOTE — Consult Note (Signed)
[facility] Vascular Consult Note  MRN : [id number]  Paul [name]. is a 54 y.o. (06-Jun-1965) male who presents with chief complaint of  Chief Complaint  Patient presents with  . Wound Infection   History of Present Illness: The patient is a 54 year old male well-known to our service who presented to the [facility] emergency department complaining of a right upper extremity "wound infection".  Patient underwent a brachial axillary graft creation approximately 1 month ago.  Patient endorses a history of pulling off the Dermabond "a few days ago".  After the Dermabond was removed by the patient, the patient noted the medial aspect of the wound opened.  Denies any drainage.  Denies any fever, nausea vomiting.  Patient notes he was placed on doxycycline by nephrology.  Patient is currently maintained by a PermCath.  Patient notes that he has not returned to our office for his first postop check due to transportation issues.  Vascular surgery was consulted by Dr. Kerman Passey for further recommendations.  No current facility-administered medications for this encounter.   Current Outpatient Medications  Medication Sig Dispense Refill  . allopurinol (ZYLOPRIM) 100 MG tablet Take 1 tablet (100 mg total) by mouth daily. 30 tablet 0  . amiodarone (PACERONE) 100 MG tablet Take 100 mg by mouth daily.     Marland Kitchen aspirin EC 81 MG tablet Take 1 tablet (81 mg total) by mouth daily. (Patient not taking: Reported on 05/15/2019) 90 tablet 3  . calcium acetate (PHOSLO) 667 MG capsule Take 1,334 mg by mouth 3 (three) times daily.    . carvedilol (COREG) 3.125 MG tablet Take 1 tablet (3.125 mg total) by mouth 2 (two) times daily. 60 tablet 0  . cephALEXin (KEFLEX) 500 MG capsule Take 1 capsule (500 mg total) by mouth 2 (two) times daily. (Patient not taking: Reported on 05/02/2019) 20 capsule 0  . clopidogrel (PLAVIX) 75 MG tablet Take 1 tablet (75 mg total) by mouth  daily with breakfast. (Patient not taking: Reported on 05/02/2019) 30 tablet 0  . dicyclomine (BENTYL) 10 MG capsule Take 1 capsule (10 mg total) by mouth 4 (four) times daily -  before meals and at bedtime. (Patient not taking: Reported on 05/15/2019) 120 capsule 0  . ferrous sulfate 325 (65 FE) MG tablet Take 1 tablet (325 mg total) by mouth 2 (two) times daily with a meal.  3  . gabapentin (NEURONTIN) 100 MG capsule Take 1 capsule (100 mg total) by mouth 3 (three) times daily. 90 capsule 0  . HYDROcodone-acetaminophen (NORCO) 5-325 MG tablet Take 1-2 tablets by mouth every 6 (six) hours as needed for moderate pain or severe pain. (Patient not taking: Reported on 05/15/2019) 30 tablet 0  . loperamide (IMODIUM) 2 MG capsule Take 1 capsule (2 mg total) by mouth every 8 (eight) hours as needed for diarrhea or loose stools. 10 capsule 0  . predniSONE (DELTASONE) 5 MG tablet Take 1 tablet (5 mg total) by mouth daily with breakfast. 40 tablet 0   Past Medical History:  Diagnosis Date  . Anemia   . Cardiac defibrillator in place    a. Biotronik LUmax 540 DRT, (ser # [id number]).  . Carotid arterial disease ([place])    a. s/p prior LICA stenting;  b. 06/5051 Carotid U/S: 40-59% bilat ICA stenosis. Patent LICA stent.  . CHF (congestive heart failure) ([place])   . CKD (chronic kidney disease), stage III   . Coronary artery disease    a.  2010 s/p CABG x 3.  . DDD (degenerative disc disease), lumbosacral    L5-S1  . Diabetes ([place])    Lantus at bedtime  . Gangrene of toe of right foot ([place])   . Gout of left hand 10/06/2016  . HFrEF (heart failure with reduced ejection fraction) ([place])    a. 07/2016 Echo: EF 25-30%, diff HK, mild MR, mildly dil LA, mod reduced RV fxn, PASP 72mmHg.  Marland Kitchen Hypertension    takes Coreg daily  . Ischemic cardiomyopathy    a. 07/2016 Echo: EF 25-30%, diff HK.  . Myocardial infarction ([place]) 2009  . Peripheral vascular disease ([place])   . Sleep apnea    sleep study yr ago-unable to afford  cpap  . Stroke [facility]) 09   no weakness   Past Surgical History:  Procedure Laterality Date  . AMPUTATION Right 12/23/2018   Procedure: AMPUTATION BELOW KNEE (Right);  Surgeon: Algernon Huxley, MD;  Location: [facility] ORS;  Service: General;  Laterality: Right;  . AMPUTATION TOE Right 08/22/2016   Procedure: AMPUTATION TOE;  Surgeon: Samara Deist, DPM;  Location: [facility] OR[facility];  Service: Podiatry;  Laterality: Right;  . AMPUTATION TOE Right 10/09/2016   Procedure: AMPUTATION TOE-RIGHT 2ND MPJ;  Surgeon: Samara Deist, DPM;  Location: [facility];  Service: Podiatry;  Laterality: Right;  . APLIGRAFT PLACEMENT Right 10/09/2016   Procedure: APLIGRAFT PLACEMENT;  Surgeon: Samara Deist, DPM;  Location: [facility];  Service: Podiatry;  Laterality: Right;  . AV FISTULA PLACEMENT Right 05/11/2019   Procedure: INSERTION OF ARTERIOVENOUS (AV) GORE-TEX GRAFT ARM;  Surgeon: Algernon Huxley, MD;  Location: [facility];  Service: Vascular;  Laterality: Right;  . CALCANEAL OSTEOTOMY Bilateral 07/28/2018   Procedure: RIGHT CALCANECTOMY BILATERAL DEBRIDEMENT OF ULCERS ON HEELS;  Surgeon: Albertine Patricia, DPM;  Location: [facility];  Service: Podiatry;  Laterality: Bilateral;  . CARDIAC DEFIBRILLATOR PLACEMENT  2011  . CAROTID ENDARTERECTOMY Left   . CHOLECYSTECTOMY  2010  . CORONARY ARTERY BYPASS GRAFT  2010   CABG x 3   . DIALYSIS/PERMA CATHETER INSERTION N/A 10/07/2018   Procedure: DIALYSIS/PERMA CATHETER INSERTION;  Surgeon: Katha Cabal, MD;  Location: [place] [facility];  Service: Cardiovascular;  Laterality: N/A;  . GRAFT APPLICATION Right 08/10/8117   Procedure: FULL THICKNESS SKIN GRAFT-RIGHT FOOT;  Surgeon: Algernon Huxley, MD;  Location: [facility];  Service: Vascular;  Laterality: Right;  . HIP SURGERY Right 1994  . INCISION AND DRAINAGE Right 09/08/2018   Procedure: INCISION AND DRAINAGE - [place] OF DEFECTIVE SKIN, SOFT TISSUE AND BONE;  Surgeon: Albertine Patricia, DPM;  Location: [facility];  Service: Podiatry;   Laterality: Right;  . INCISION AND DRAINAGE OF WOUND Right 08/22/2016   Procedure: IRRIGATION AND DEBRIDEMENT WOUND and wound vac placement;  Surgeon: Samara Deist, DPM;  Location: [facility];  Service: Podiatry;  Laterality: Right;  . IRRIGATION AND DEBRIDEMENT ABSCESS Right 11/24/2018   Procedure: IRRIGATION AND DEBRIDEMENT ABSCESS RIGHT FOOT, COMPLICATED, DIABETIC;  Surgeon: Albertine Patricia, DPM;  Location: [facility];  Service: Podiatry;  Laterality: Right;  . LOWER EXTREMITY ANGIOGRAPHY Right 08/24/2016   Procedure: Lower Extremity Angiography;  Surgeon: Algernon Huxley, MD;  Location: [place] [facility];  Service: Cardiovascular;  Laterality: Right;  . LOWER EXTREMITY ANGIOGRAPHY Right 07/27/2018   Procedure: RIGHT Lower Extremity Angiography;  Surgeon: Algernon Huxley, MD;  Location: [place] [facility];  Service: Cardiovascular;  Laterality: Right;  . LOWER EXTREMITY ANGIOGRAPHY Right 09/09/2018   Procedure: Lower Extremity Angiography;  Surgeon:  Schnier, Dolores Lory, MD;  Location: [place] [facility];  Service: Cardiovascular;  Laterality: Right;  . MASS EXCISION Right 07/18/2014   Procedure: EXCISION HETEROTOPIC BONE RIGHT HIP;  Surgeon: Frederik Pear, MD;  Location: [place];  Service: Orthopedics;  Laterality: Right;  . Open Heart Surgery  2010   x 3  . WOUND DEBRIDEMENT Right 10/09/2016   Procedure: DEBRIDEMENT WOUND;  Surgeon: Samara Deist, DPM;  Location: [facility];  Service: Podiatry;  Laterality: Right;   Social History Social History   Tobacco Use  . Smoking status: Never Smoker  . Smokeless tobacco: Never Used  Substance Use Topics  . Alcohol use: No  . Drug use: No   Family History Family History  Problem Relation Age of Onset  . Hypertension Other   . Diabetes Other   . Diabetes Father   . Hyperlipidemia Father   . Hypertension Father   . Hyperlipidemia Sister   . Hypertension Sister   . Diabetes Sister   . Diabetes Brother   . Hypertension Brother   . Hyperlipidemia  Brother   Denies family history of peripheral artery disease, venous disease or renal disease.  Allergies  Allergen Reactions  . Other Other (See Comments)    Cardiac Problems. Pt states he tolerates Toradol. Due to kidney and heart problems per pt  . Ibuprofen Other (See Comments)    Heart problems  . Baclofen Other (See Comments)  . Metformin Diarrhea  . Nsaids     Due to kidney and heart problems per pt   REVIEW OF SYSTEMS (Negative unless checked)  Constitutional: [] Weight loss  [] Fever  [] Chills Cardiac: [] Chest pain   [] Chest pressure   [] Palpitations   [] Shortness of breath when laying flat   [] Shortness of breath at rest   [] Shortness of breath with exertion. Vascular:  [] Pain in legs with walking   [] Pain in legs at rest   [] Pain in legs when laying flat   [] Claudication   [] Pain in feet when walking  [] Pain in feet at rest  [] Pain in feet when laying flat   [] History of DVT   [] Phlebitis   [] Swelling in legs   [] Varicose veins   [] Non-healing ulcers Pulmonary:   [] Uses home oxygen   [] Productive cough   [] Hemoptysis   [] Wheeze  [] COPD   [] Asthma Neurologic:  [] Dizziness  [] Blackouts   [] Seizures   [] History of stroke   [] History of TIA  [] Aphasia   [] Temporary blindness   [] Dysphagia   [] Weakness or numbness in arms   [] Weakness or numbness in legs Musculoskeletal:  [] Arthritis   [] Joint swelling   [] Joint pain   [] Low back pain Hematologic:  [] Easy bruising  [] Easy bleeding   [] Hypercoagulable state   [] Anemic  [] Hepatitis Gastrointestinal:  [] Blood in stool   [] Vomiting blood  [] Gastroesophageal reflux/heartburn   [] Difficulty swallowing. Genitourinary:  [x] Chronic kidney disease   [] Difficult urination  [] Frequent urination  [] Burning with urination   [] Blood in urine Skin:  [] Rashes   [x] Ulcers   [x] Wounds Psychological:  [] History of anxiety   []  History of major depression.  Physical Examination  Vitals:   06/29/19 1200 06/29/19 1202 06/29/19 1230 06/29/19 1300  BP:   (!) 172/90 (!) 171/97 125/78  Pulse: 84  79 75  Resp:      Temp:      TempSrc:      SpO2: 100%  100% 99%  Weight:      Height:       Body mass  index is 27.26 kg/m. Gen:  WD/WN, NAD Head: [place]/AT, No temporalis wasting. Prominent temp pulse not noted. Ear/Nose/Throat: Hearing grossly intact, nares w/o erythema or drainage, oropharynx w/o Erythema/Exudate Eyes: Sclera non-icteric, conjunctiva clear Neck: Trachea midline.  No JVD.  Pulmonary:  Good air movement, respirations not labored, equal bilaterally.  Cardiac: RRR, normal S1, S2. Vascular:  Vessel Right Left  Radial Palpable Palpable  Ulnar Palpable Palpable  Brachial Palpable Palpable  Carotid Palpable, without bruit Palpable, without bruit  Aorta Not palpable N/A  Femoral Palpable Palpable  Popliteal BKA Palpable  PT BKA Non-Palpable  DP BKA Non-Palpable   Right Lower Extremity: BKA stump healthy  Right upper Extremity: Dehiscence noted to the medial aspect of the distal incision.  No drainage.  Nontender to palpation.  No bruit or thrill noted in the graft.  2+ radial pulse.  Hand is warm.  No erythema.    Document Information Photos    06/29/2019 12:08  Attached To:  Hospital Encounter on 06/29/19  Source Information , [facility] [place]  [facility]Emergency Department   Gastrointestinal: soft, non-tender/non-distended. No guarding/reflex.  Musculoskeletal: M/S 5/5 throughout.  Extremities without ischemic changes.  No deformity or atrophy. No edema. Neurologic: Sensation grossly intact in extremities.  Symmetrical.  Speech is fluent. Motor exam as listed above. Psychiatric: Judgment intact, Mood & affect appropriate for pt's clinical situation. Dermatologic: As above Lymph : No Cervical, Axillary, or Inguinal lymphadenopathy.  CBC Lab Results  Component Value Date   WBC 14.2 (H) 06/29/2019   HGB 12.1 (L) 06/29/2019   HCT 39.1 06/29/2019   MCV 96.5 06/29/2019   PLT 270 06/29/2019   BMET     Component Value Date/Time   NA 139 06/29/2019 0903   NA 139 06/23/2016 1553   K 4.1 06/29/2019 0903   CL 104 06/29/2019 0903   CO2 20 (L) 06/29/2019 0903   GLUCOSE 144 (H) 06/29/2019 0903   BUN 68 (H) 06/29/2019 0903   BUN 46 (H) 06/23/2016 1553   CREATININE 11.70 (H) 06/29/2019 0903   CALCIUM 7.4 (L) 06/29/2019 0903   GFRNONAA 4 (L) 06/29/2019 0903   GFRAA 5 (L) 06/29/2019 0903   Estimated Creatinine Clearance: 7.5 mL/min (A) (by C-G formula based on SCr of 11.7 mg/dL (H)).  COAG Lab Results  Component Value Date   INR 1.1 05/11/2019   INR 1.0 05/09/2019   INR 1.1 12/23/2018   Radiology No results found.  Assessment/Plan The patient is a 54 year old male well-known to our service who presented to the [facility] emergency department complaining of a right upper extremity "wound infection".  1. Right Upper Extremity Brach-Axillary Graft: Patient endorses a history of pulling off Dermabond from distal incision.  Few days after he remove the Dermabond, dehiscence to the medial aspect of the wound occurred.  He denies any drainage from the wound.  Denies any fever, nausea vomiting.  He was placed on doxycycline by nephrology.  The patient notes he has not been seen in our outpatient setting for his first postoperative transportation issues.  At this time, the graft does not seem to be infected however infection of the graft would require excision.  Spoke with patient and recommended admission to the hospital for IV antibiotics and observation to avoid infection of the graft.  At this time, the patient is refusing admission.  We will see the patient in our office on Monday to undergo a right upper extremity HDA (as I do not feel a bruit) and for  wound monitoring.  2. ESRD: Patient should continue dialysis as per his normal routine through his PermCath.  3. Diabetes:  On appropriate medications. Encouraged good control as its slows the progression of  atherosclerotic disease  Discussed with Dr. Mayme Genta, PA-C  06/29/2019 1:41 PM  This note was created with Dragon medical transcription system.  Any error is purely unintentional

## 2019-06-29 NOTE — ED Provider Notes (Signed)
[facility] Emergency Department Provider Note  Time seen: 8:53 AM  I have reviewed the triage vital signs and the nursing notes.   HISTORY  Chief Complaint Wound Infection   HPI Paul [name]. is a 54 y.o. male with a past medical history of anemia, ESRD on HD, diabetes, hypertension, presents to the emergency department for evaluation of recent AV graft in the right upper extremity as well as a blister to left lower extremity.  According to the patient last night he states he was feeling somewhat nauseated and woke up quite diaphoretic.  Patient went to dialysis this morning and they were concerned because his recent AV graft incision site had opened up somewhat in his right upper extremity and they were worried that he could have an infection so they sent him to the emergency department for evaluation.  Patient also states he has a small ulceration to the heel of his left foot but thought that was healing okay.  Denies any known fever.  Denies any vomiting.  States he is feeling much better currently.   Past Medical History:  Diagnosis Date  . Anemia   . Cardiac defibrillator in place    a. Biotronik LUmax 540 DRT, (ser # [id number]).  . Carotid arterial disease ([place])    a. s/p prior LICA stenting;  b. 10/1441 Carotid U/S: 40-59% bilat ICA stenosis. Patent LICA stent.  . CHF (congestive heart failure) ([place])   . CKD (chronic kidney disease), stage III   . Coronary artery disease    a. 2010 s/p CABG x 3.  . DDD (degenerative disc disease), lumbosacral    L5-S1  . Diabetes ([place])    Lantus at bedtime  . Gangrene of toe of right foot ([place])   . Gout of left hand 10/06/2016  . HFrEF (heart failure with reduced ejection fraction) ([place])    a. 07/2016 Echo: EF 25-30%, diff HK, mild MR, mildly dil LA, mod reduced RV fxn, PASP 74mmHg.  Marland Kitchen Hypertension    takes Coreg daily  . Ischemic cardiomyopathy    a. 07/2016 Echo: EF 25-30%, diff HK.  . Myocardial infarction ([place])  2009  . Peripheral vascular disease ([place])   . Sleep apnea    sleep study yr ago-unable to afford cpap  . Stroke [facility]) 09   no weakness    Patient Active Problem List   Diagnosis Date Noted  . ESRD on dialysis ([place]) 02/21/2019  . Below-knee amputation of right lower extremity ([place]) 01/23/2019  . Surgical wound infection 01/23/2019  . Right foot infection 12/21/2018  . AKI (acute kidney injury) ([place]) 10/04/2018  . Acute osteomyelitis of right ankle or foot ([place]) 09/06/2018  . Pressure ulcer of heel, right, unstageable ([place]) 09/06/2018  . Atrial flutter ([place]) 08/31/2018  . Personal history of gout 08/31/2018  . Shockable cardiac rhythm detected by automated external defibrillator 08/30/2018  . Acute osteomyelitis of right foot ([place]) 07/25/2018  . Osteomyelitis ([place]) 07/25/2018  . Anasarca 05/25/2018  . Pressure injury of skin 05/25/2018  . CKD (chronic kidney disease), stage IV ([place]) 12/15/2016  . Ulcerated, foot ([place]) 12/15/2016  . Gangrene of foot ([place]) 08/21/2016  . HTN (hypertension) 08/21/2016  . Diabetes ([place]) 08/21/2016  . CAD (coronary artery disease) 08/21/2016  . Chronic systolic CHF (congestive heart failure) ([place]) 08/21/2016  . Diabetic foot infection ([place]) 08/21/2016  . Heterotopic ossification of bone 07/12/2014    Past Surgical History:  Procedure Laterality Date  . AMPUTATION Right 12/23/2018  Procedure: AMPUTATION BELOW KNEE (Right);  Surgeon: Algernon Huxley, MD;  Location: [facility];  Service: General;  Laterality: Right;  . AMPUTATION TOE Right 08/22/2016   Procedure: AMPUTATION TOE;  Surgeon: Samara Deist, DPM;  Location: [facility];  Service: Podiatry;  Laterality: Right;  . AMPUTATION TOE Right 10/09/2016   Procedure: AMPUTATION TOE-RIGHT 2ND MPJ;  Surgeon: Samara Deist, DPM;  Location: [facility];  Service: Podiatry;  Laterality: Right;  . APLIGRAFT PLACEMENT Right 10/09/2016   Procedure: APLIGRAFT PLACEMENT;  Surgeon: Samara Deist, DPM;  Location: [facility];   Service: Podiatry;  Laterality: Right;  . AV FISTULA PLACEMENT Right 05/11/2019   Procedure: INSERTION OF ARTERIOVENOUS (AV) GORE-TEX GRAFT ARM;  Surgeon: Algernon Huxley, MD;  Location: [facility];  Service: Vascular;  Laterality: Right;  . CALCANEAL OSTEOTOMY Bilateral 07/28/2018   Procedure: RIGHT CALCANECTOMY BILATERAL DEBRIDEMENT OF ULCERS ON HEELS;  Surgeon: Albertine Patricia, DPM;  Location: [facility];  Service: Podiatry;  Laterality: Bilateral;  . CARDIAC DEFIBRILLATOR PLACEMENT  2011  . CAROTID ENDARTERECTOMY Left   . CHOLECYSTECTOMY  2010  . CORONARY ARTERY BYPASS GRAFT  2010   CABG x 3   . DIALYSIS/PERMA CATHETER INSERTION N/A 10/07/2018   Procedure: DIALYSIS/PERMA CATHETER INSERTION;  Surgeon: Katha Cabal, MD;  Location: [place] [facility];  Service: Cardiovascular;  Laterality: N/A;  . GRAFT APPLICATION Right 6/31/4970   Procedure: FULL THICKNESS SKIN GRAFT-RIGHT FOOT;  Surgeon: Algernon Huxley, MD;  Location: [facility];  Service: Vascular;  Laterality: Right;  . HIP SURGERY Right 1994  . INCISION AND DRAINAGE Right 09/08/2018   Procedure: INCISION AND DRAINAGE - [place] OF DEFECTIVE SKIN, SOFT TISSUE AND BONE;  Surgeon: Albertine Patricia, DPM;  Location: [facility];  Service: Podiatry;  Laterality: Right;  . INCISION AND DRAINAGE OF WOUND Right 08/22/2016   Procedure: IRRIGATION AND DEBRIDEMENT WOUND and wound vac placement;  Surgeon: Samara Deist, DPM;  Location: [facility];  Service: Podiatry;  Laterality: Right;  . IRRIGATION AND DEBRIDEMENT ABSCESS Right 11/24/2018   Procedure: IRRIGATION AND DEBRIDEMENT ABSCESS RIGHT FOOT, COMPLICATED, DIABETIC;  Surgeon: Albertine Patricia, DPM;  Location: [facility];  Service: Podiatry;  Laterality: Right;  . LOWER EXTREMITY ANGIOGRAPHY Right 08/24/2016   Procedure: Lower Extremity Angiography;  Surgeon: Algernon Huxley, MD;  Location: [place] [facility];  Service: Cardiovascular;  Laterality: Right;  . LOWER EXTREMITY ANGIOGRAPHY Right 07/27/2018    Procedure: RIGHT Lower Extremity Angiography;  Surgeon: Algernon Huxley, MD;  Location: [place] [facility];  Service: Cardiovascular;  Laterality: Right;  . LOWER EXTREMITY ANGIOGRAPHY Right 09/09/2018   Procedure: Lower Extremity Angiography;  Surgeon: Katha Cabal, MD;  Location: [place] [facility];  Service: Cardiovascular;  Laterality: Right;  . MASS EXCISION Right 07/18/2014   Procedure: EXCISION HETEROTOPIC BONE RIGHT HIP;  Surgeon: Frederik Pear, MD;  Location: [place];  Service: Orthopedics;  Laterality: Right;  . Open Heart Surgery  2010   x 3  . WOUND DEBRIDEMENT Right 10/09/2016   Procedure: DEBRIDEMENT WOUND;  Surgeon: Samara Deist, DPM;  Location: [facility];  Service: Podiatry;  Laterality: Right;    Prior to Admission medications   Medication Sig Start Date End Date Taking? Authorizing Provider  allopurinol (ZYLOPRIM) 100 MG tablet Take 1 tablet (100 mg total) by mouth daily. 08/02/18   Fritzi Mandes, MD  amiodarone (PACERONE) 100 MG tablet Take 100 mg by mouth daily.  01/15/18   [provider]  aspirin EC 81 MG tablet Take 1 tablet (  81 mg total) by mouth daily. Patient not taking: Reported on 05/15/2019 10/21/16   Theora Gianotti, NP  calcium acetate (PHOSLO) 667 MG capsule Take 1,334 mg by mouth 3 (three) times daily. 12/20/18   [provider]  carvedilol (COREG) 3.125 MG tablet Take 1 tablet (3.125 mg total) by mouth 2 (two) times daily. 05/29/18   Loletha Grayer, MD  cephALEXin (KEFLEX) 500 MG capsule Take 1 capsule (500 mg total) by mouth 2 (two) times daily. Patient not taking: Reported on 05/02/2019 01/23/19   Kris Hartmann, NP  clopidogrel (PLAVIX) 75 MG tablet Take 1 tablet (75 mg total) by mouth daily with breakfast. Patient not taking: Reported on 05/02/2019 09/12/18   Mayo, Pete Pelt, MD  dicyclomine (BENTYL) 10 MG capsule Take 1 capsule (10 mg total) by mouth 4 (four) times daily -  before meals and at bedtime. Patient not taking: Reported on  05/15/2019 10/11/18   Loletha Grayer, MD  ferrous sulfate 325 (65 FE) MG tablet Take 1 tablet (325 mg total) by mouth 2 (two) times daily with a meal. 01/02/19   Bettey Costa, MD  gabapentin (NEURONTIN) 100 MG capsule Take 1 capsule (100 mg total) by mouth 3 (three) times daily. 10/11/18   Loletha Grayer, MD  HYDROcodone-acetaminophen (NORCO) 5-325 MG tablet Take 1-2 tablets by mouth every 6 (six) hours as needed for moderate pain or severe pain. Patient not taking: Reported on 05/15/2019 05/11/19   Algernon Huxley, MD  loperamide (IMODIUM) 2 MG capsule Take 1 capsule (2 mg total) by mouth every 8 (eight) hours as needed for diarrhea or loose stools. 10/11/18   Loletha Grayer, MD  predniSONE (DELTASONE) 5 MG tablet Take 1 tablet (5 mg total) by mouth daily with breakfast. 08/02/18   Fritzi Mandes, MD    Allergies  Allergen Reactions  . Other Other (See Comments)    Cardiac Problems. Pt states he tolerates Toradol. Due to kidney and heart problems per pt  . Ibuprofen Other (See Comments)    Heart problems  . Baclofen Other (See Comments)  . Metformin Diarrhea  . Nsaids     Due to kidney and heart problems per pt    Family History  Problem Relation Age of Onset  . Hypertension Other   . Diabetes Other   . Diabetes Father   . Hyperlipidemia Father   . Hypertension Father   . Hyperlipidemia Sister   . Hypertension Sister   . Diabetes Sister   . Diabetes Brother   . Hypertension Brother   . Hyperlipidemia Brother     Social History Social History   Tobacco Use  . Smoking status: Never Smoker  . Smokeless tobacco: Never Used  Substance Use Topics  . Alcohol use: No  . Drug use: No    Review of Systems Constitutional: Negative for fever. Cardiovascular: Negative for chest pain. Respiratory: Negative for shortness of breath. Gastrointestinal: Negative for abdominal pain, vomiting.  Nausea last night. Musculoskeletal: Small opening of his recent right upper extremity AV graft  incision, small blister/ulceration to the heels left lower extremity. Skin: Diaphoresis overnight. Neurological: Negative for headache All other ROS negative  ____________________________________________   PHYSICAL EXAM:  VITAL SIGNS: ED Triage Vitals [06/29/19 0845]  Enc Vitals Group     BP (!) 182/109     Pulse Rate 96     Resp 16     Temp 98.1 F (36.7 C)     Temp Source Oral     SpO2 100 %  Weight 190 lb (86.2 kg)     Height 5\' 10"  (1.778 m)     Head Circumference      Peak Flow      Pain Score 6     Pain Loc      Pain Edu?      Excl. in [place]?    Constitutional: Alert and oriented. Well appearing and in no distress. Eyes: Normal exam ENT      Head: Normocephalic and atraumatic.      Mouth/Throat: Mucous membranes are moist. Cardiovascular: Normal rate, regular rhythm Respiratory: Normal respiratory effort without tachypnea nor retractions. Breath sounds are clear  Gastrointestinal: Soft and nontender. No distention.  Musculoskeletal: Patient has a small area approximately 2 cm of dehiscence to his right upper extremity AV graft incision site from 05/10/2019.  No bleeding.  Minimal exudate.  Patient does have a small approximately 1.5 x 1.5 cm ulceration to the heel of his left foot that appears well-healing, no signs of infection. Neurologic:  Normal speech and language. No gross focal neurologic deficits Skin:  See MSK exam above, otherwise warm and dry skin. Psychiatric: Mood and affect are normal.     INITIAL IMPRESSION / ASSESSMENT AND PLAN / ED COURSE  Pertinent labs & imaging results that were available during my care of the patient were reviewed by me and considered in my medical decision making (see chart for details).   Patient presents to the emergency department for small dehiscence of his right upper extremity AV graft incision with a very small amount of exudate from this area, well up appearing small pressure ulceration to the left heel.  Overall the  patient appears well.  Afebrile in the emergency department.  We will check labs and continue to closely monitor.  Patient's labs show slight leukocytosis otherwise largely at baseline.  Potassium is 4.1.  Dr. Lazaro Arms is currently in the operating room but states he will call back once completed.  Patient states he does not wish to wait any longer, states he wants to go home.  We will have the patient follow-up with Dr. Lucky Cowboy as an outpatient.  Patient is already taking doxycycline currently on day 3.  We will continue with a course of doxycycline at the patient follow-up with Dr. Lucky Cowboy.  [name] was evaluated in Emergency Department on 06/29/2019 for the symptoms described in the history of present illness. He was evaluated in the context of the global COVID-19 pandemic, which necessitated consideration that the patient might be at risk for infection with the SARS-CoV-2 virus that causes COVID-19. Institutional protocols and algorithms that pertain to the evaluation of patients at risk for COVID-19 are in a state of rapid change based on information released by regulatory bodies including the CDC and federal and state organizations. These policies and algorithms were followed during the patient's care in the ED.  ____________________________________________   FINAL CLINICAL IMPRESSION(S) / ED DIAGNOSES  Incision dehiscence   Harvest Dark, MD 06/29/19 1149

## 2019-06-29 NOTE — ED Notes (Signed)
Pt denies any needs currently. Understands he remains on list to be transported home via EMS. Bed locked low. Rail up. Call bell within reach.

## 2019-06-29 NOTE — ED Notes (Signed)
Pt's L heel wound and R arm graft area assessed and new dressings applied per EDP Paduchowski verbal order.

## 2019-07-03 ENCOUNTER — Ambulatory Visit (INDEPENDENT_AMBULATORY_CARE_PROVIDER_SITE_OTHER): Payer: Medicare Other | Admitting: Nurse Practitioner

## 2019-07-03 ENCOUNTER — Encounter (INDEPENDENT_AMBULATORY_CARE_PROVIDER_SITE_OTHER): Payer: Medicare Other

## 2019-07-04 ENCOUNTER — Other Ambulatory Visit: Payer: Self-pay

## 2019-07-04 ENCOUNTER — Emergency Department
Admission: EM | Admit: 2019-07-04 | Discharge: 2019-07-04 | Disposition: A | Discharge status: Home / Self Care | Payer: Medicare Other | Attending: Emergency Medicine | Admitting: Emergency Medicine

## 2019-07-04 ENCOUNTER — Emergency Department: Payer: Medicare Other

## 2019-07-04 ENCOUNTER — Encounter: Payer: Self-pay | Admitting: Emergency Medicine

## 2019-07-04 DIAGNOSIS — Y828 Other medical devices associated with adverse incidents: Secondary | ICD-10-CM | POA: Insufficient documentation

## 2019-07-04 DIAGNOSIS — N186 End stage renal disease: Secondary | ICD-10-CM | POA: Insufficient documentation

## 2019-07-04 DIAGNOSIS — Z992 Dependence on renal dialysis: Secondary | ICD-10-CM | POA: Diagnosis not present

## 2019-07-04 DIAGNOSIS — I5022 Chronic systolic (congestive) heart failure: Secondary | ICD-10-CM | POA: Diagnosis not present

## 2019-07-04 DIAGNOSIS — T82590A Other mechanical complication of surgically created arteriovenous fistula, initial encounter: Secondary | ICD-10-CM | POA: Diagnosis not present

## 2019-07-04 DIAGNOSIS — E1122 Type 2 diabetes mellitus with diabetic chronic kidney disease: Secondary | ICD-10-CM | POA: Insufficient documentation

## 2019-07-04 DIAGNOSIS — Z79899 Other long term (current) drug therapy: Secondary | ICD-10-CM | POA: Insufficient documentation

## 2019-07-04 DIAGNOSIS — T82868A Thrombosis of vascular prosthetic devices, implants and grafts, initial encounter: Secondary | ICD-10-CM | POA: Diagnosis not present

## 2019-07-04 DIAGNOSIS — I770 Arteriovenous fistula, acquired: Secondary | ICD-10-CM

## 2019-07-04 DIAGNOSIS — I1311 Hypertensive heart and chronic kidney disease without heart failure, with stage 5 chronic kidney disease, or end stage renal disease: Secondary | ICD-10-CM | POA: Insufficient documentation

## 2019-07-04 DIAGNOSIS — M25521 Pain in right elbow: Secondary | ICD-10-CM | POA: Diagnosis present

## 2019-07-04 LAB — CBC WITH DIFFERENTIAL/PLATELET
Abs Immature Granulocytes: 0.14 10*3/uL — ABNORMAL HIGH (ref 0.00–0.07)
Basophils Absolute: 0 10*3/uL (ref 0.0–0.1)
Basophils Relative: 0 %
Eosinophils Absolute: 0 10*3/uL (ref 0.0–0.5)
Eosinophils Relative: 0 %
HCT: 36.4 % — ABNORMAL LOW (ref 39.0–52.0)
Hemoglobin: 11.3 g/dL — ABNORMAL LOW (ref 13.0–17.0)
Immature Granulocytes: 1 %
Lymphocytes Relative: 3 %
Lymphs Abs: 0.4 10*3/uL — ABNORMAL LOW (ref 0.7–4.0)
MCH: 30.4 pg (ref 26.0–34.0)
MCHC: 31 g/dL (ref 30.0–36.0)
MCV: 97.8 fL (ref 80.0–100.0)
Monocytes Absolute: 0.6 10*3/uL (ref 0.1–1.0)
Monocytes Relative: 4 %
Neutro Abs: 12.6 10*3/uL — ABNORMAL HIGH (ref 1.7–7.7)
Neutrophils Relative %: 92 %
Platelets: 231 10*3/uL (ref 150–400)
RBC: 3.72 MIL/uL — ABNORMAL LOW (ref 4.22–5.81)
RDW: 15 % (ref 11.5–15.5)
WBC: 13.8 10*3/uL — ABNORMAL HIGH (ref 4.0–10.5)
nRBC: 0 % (ref 0.0–0.2)

## 2019-07-04 LAB — COMPREHENSIVE METABOLIC PANEL
ALT: 15 U/L (ref 0–44)
AST: 12 U/L — ABNORMAL LOW (ref 15–41)
Albumin: 3.5 g/dL (ref 3.5–5.0)
Alkaline Phosphatase: 86 U/L (ref 38–126)
Anion gap: 15 (ref 5–15)
BUN: 56 mg/dL — ABNORMAL HIGH (ref 6–20)
CO2: 19 mmol/L — ABNORMAL LOW (ref 22–32)
Calcium: 8 mg/dL — ABNORMAL LOW (ref 8.9–10.3)
Chloride: 105 mmol/L (ref 98–111)
Creatinine, Ser: 10.52 mg/dL — ABNORMAL HIGH (ref 0.61–1.24)
GFR calc Af Amer: 6 mL/min — ABNORMAL LOW (ref >60)
GFR calc non Af Amer: 5 mL/min — ABNORMAL LOW (ref >60)
Glucose, Bld: 171 mg/dL — ABNORMAL HIGH (ref 70–99)
Potassium: 4.1 mmol/L (ref 3.5–5.1)
Sodium: 139 mmol/L (ref 135–145)
Total Bilirubin: 0.7 mg/dL (ref 0.3–1.2)
Total Protein: 7 g/dL (ref 6.5–8.1)

## 2019-07-04 LAB — URIC ACID: Uric Acid, Serum: 4.5 mg/dL (ref 3.7–8.6)

## 2019-07-04 MED ORDER — OXYCODONE-ACETAMINOPHEN 5-325 MG PO TABS
2.0000 | ORAL_TABLET | Freq: Once | ORAL | Status: AC
  Administered 2019-07-04: 2 via ORAL
  Filled 2019-07-04: qty 2

## 2019-07-04 MED ORDER — DICLOFENAC SODIUM 1 % EX GEL: 4.0000 g | Freq: Three times a day (TID) | CUTANEOUS | 0 refills | Status: AC

## 2019-07-04 MED ORDER — DEXAMETHASONE SODIUM PHOSPHATE 10 MG/ML IJ SOLN
10.0000 mg | Freq: Once | INTRAMUSCULAR | Status: AC
  Administered 2019-07-04: 10 mg via INTRAVENOUS

## 2019-07-04 MED ORDER — OXYCODONE-ACETAMINOPHEN 5-325 MG PO TABS: 1.0000 | ORAL_TABLET | Freq: Four times a day (QID) | ORAL | 0 refills | Status: DC | PRN

## 2019-07-04 MED ORDER — DICLOFENAC SODIUM 1 % EX GEL: 4.0000 g | Freq: Three times a day (TID) | CUTANEOUS | 0 refills | Status: DC

## 2019-07-04 MED ORDER — OXYCODONE-ACETAMINOPHEN 5-325 MG PO TABS: 1.0000 | ORAL_TABLET | Freq: Three times a day (TID) | ORAL | 0 refills | Status: DC | PRN

## 2019-07-04 MED ORDER — DEXAMETHASONE SODIUM PHOSPHATE 10 MG/ML IJ SOLN
10.0000 mg | Freq: Once | INTRAMUSCULAR | Status: DC
  Filled 2019-07-04: qty 1

## 2019-07-04 NOTE — ED Triage Notes (Signed)
Pt presents to ED via [facility], states was seen by home health RN today and she got a BP reading of 80/50, pt states then was told that the Home health RN could no feel the thrill in his dialysis access. Pt states he also thinks he had a seizure last night after receiving a full dialysis treatment. VSS in triage. Pt A&O x 4. Pt states "I feel like crap", pt states normally feels bad after dialysis treatment.    Pt only c/o pain to R hand, states hx of gout, pt with mild swelling noted to R hand.

## 2019-07-04 NOTE — ED Notes (Signed)
Spoke with Dr. Archie Balboa regarding patient care, see orders.

## 2019-07-04 NOTE — ED Provider Notes (Signed)
[facility] Emergency Department Provider Note  ____________________________________________   First MD Initiated Contact with Patient 07/04/19 1512     (approximate)  I have reviewed the triage vital signs and the nursing notes.   HISTORY  Chief Complaint Vascular Access Problem    HPI Paul [name]. is a 54 y.o. male  With PMHx below here with pain to R elbow and hand, as well as need for access to HD fistula. Pt reports that he was sent here by his nurse due to lack of thrill over R avf site. He reports that for the past 2 days, he felt elbow and now hand pain on the right, which is similar to his gout exacerbations. He took an extra prednisone today which he feels is helping. He went to HD yesterday and had a full session via a R central line. He notified his home health nurse today who noticed that he had no thrill on his R AVF. He is s/p graft placement in January. Denies any pain over the graft site. He does have a superficial wound ot the area that has been ongoing for weeks, and he feels like it is improving w/o any redness, drainage. No fever or chills.       Past Medical History:  Diagnosis Date  . Anemia   . Cardiac defibrillator in place    a. Biotronik LUmax 540 DRT, (ser # [id number]).  . Carotid arterial disease ([place])    a. s/p prior LICA stenting;  b. 06/4266 Carotid U/S: 40-59% bilat ICA stenosis. Patent LICA stent.  . CHF (congestive heart failure) ([place])   . CKD (chronic kidney disease), stage III   . Coronary artery disease    a. 2010 s/p CABG x 3.  . DDD (degenerative disc disease), lumbosacral    L5-S1  . Diabetes ([place])    Lantus at bedtime  . Gangrene of toe of right foot ([place])   . Gout of left hand 10/06/2016  . HFrEF (heart failure with reduced ejection fraction) ([place])    a. 07/2016 Echo: EF 25-30%, diff HK, mild MR, mildly dil LA, mod reduced RV fxn, PASP 4mmHg.  Marland Kitchen Hypertension    takes Coreg daily  . Ischemic  cardiomyopathy    a. 07/2016 Echo: EF 25-30%, diff HK.  . Myocardial infarction ([place]) 2009  . Peripheral vascular disease ([place])   . Sleep apnea    sleep study yr ago-unable to afford cpap  . Stroke [facility]) 09   no weakness    Patient Active Problem List   Diagnosis Date Noted  . ESRD on dialysis ([place]) 02/21/2019  . Below-knee amputation of right lower extremity ([place]) 01/23/2019  . Surgical wound infection 01/23/2019  . Right foot infection 12/21/2018  . AKI (acute kidney injury) ([place]) 10/04/2018  . Acute osteomyelitis of right ankle or foot ([place]) 09/06/2018  . Pressure ulcer of heel, right, unstageable ([place]) 09/06/2018  . Atrial flutter ([place]) 08/31/2018  . Personal history of gout 08/31/2018  . Shockable cardiac rhythm detected by automated external defibrillator 08/30/2018  . Acute osteomyelitis of right foot ([place]) 07/25/2018  . Osteomyelitis ([place]) 07/25/2018  . Anasarca 05/25/2018  . Pressure injury of skin 05/25/2018  . CKD (chronic kidney disease), stage IV ([place]) 12/15/2016  . Ulcerated, foot ([place]) 12/15/2016  . Gangrene of foot ([place]) 08/21/2016  . HTN (hypertension) 08/21/2016  . Diabetes ([place]) 08/21/2016  . CAD (coronary artery disease) 08/21/2016  . Chronic systolic CHF (congestive heart failure) ([place])  08/21/2016  . Diabetic foot infection ([place]) 08/21/2016  . Heterotopic ossification of bone 07/12/2014    Past Surgical History:  Procedure Laterality Date  . AMPUTATION Right 12/23/2018   Procedure: AMPUTATION BELOW KNEE (Right);  Surgeon: Algernon Huxley, MD;  Location: [facility] ORS;  Service: General;  Laterality: Right;  . AMPUTATION TOE Right 08/22/2016   Procedure: AMPUTATION TOE;  Surgeon: Samara Deist, DPM;  Location: [facility] OR[facility];  Service: Podiatry;  Laterality: Right;  . AMPUTATION TOE Right 10/09/2016   Procedure: AMPUTATION TOE-RIGHT 2ND MPJ;  Surgeon: Samara Deist, DPM;  Location: [facility];  Service: Podiatry;  Laterality: Right;  . APLIGRAFT PLACEMENT Right 10/09/2016     Procedure: APLIGRAFT PLACEMENT;  Surgeon: Samara Deist, DPM;  Location: [facility];  Service: Podiatry;  Laterality: Right;  . AV FISTULA PLACEMENT Right 05/11/2019   Procedure: INSERTION OF ARTERIOVENOUS (AV) GORE-TEX GRAFT ARM;  Surgeon: Algernon Huxley, MD;  Location: [facility];  Service: Vascular;  Laterality: Right;  . CALCANEAL OSTEOTOMY Bilateral 07/28/2018   Procedure: RIGHT CALCANECTOMY BILATERAL DEBRIDEMENT OF ULCERS ON HEELS;  Surgeon: Albertine Patricia, DPM;  Location: [facility];  Service: Podiatry;  Laterality: Bilateral;  . CARDIAC DEFIBRILLATOR PLACEMENT  2011  . CAROTID ENDARTERECTOMY Left   . CHOLECYSTECTOMY  2010  . CORONARY ARTERY BYPASS GRAFT  2010   CABG x 3   . DIALYSIS/PERMA CATHETER INSERTION N/A 10/07/2018   Procedure: DIALYSIS/PERMA CATHETER INSERTION;  Surgeon: Katha Cabal, MD;  Location: [place] [facility];  Service: Cardiovascular;  Laterality: N/A;  . GRAFT APPLICATION Right 2/69/4854   Procedure: FULL THICKNESS SKIN GRAFT-RIGHT FOOT;  Surgeon: Algernon Huxley, MD;  Location: [facility];  Service: Vascular;  Laterality: Right;  . HIP SURGERY Right 1994  . INCISION AND DRAINAGE Right 09/08/2018   Procedure: INCISION AND DRAINAGE - [place] OF DEFECTIVE SKIN, SOFT TISSUE AND BONE;  Surgeon: Albertine Patricia, DPM;  Location: [facility];  Service: Podiatry;  Laterality: Right;  . INCISION AND DRAINAGE OF WOUND Right 08/22/2016   Procedure: IRRIGATION AND DEBRIDEMENT WOUND and wound vac placement;  Surgeon: Samara Deist, DPM;  Location: [facility];  Service: Podiatry;  Laterality: Right;  . IRRIGATION AND DEBRIDEMENT ABSCESS Right 11/24/2018   Procedure: IRRIGATION AND DEBRIDEMENT ABSCESS RIGHT FOOT, COMPLICATED, DIABETIC;  Surgeon: Albertine Patricia, DPM;  Location: [facility];  Service: Podiatry;  Laterality: Right;  . LOWER EXTREMITY ANGIOGRAPHY Right 08/24/2016   Procedure: Lower Extremity Angiography;  Surgeon: Algernon Huxley, MD;  Location: [place] [facility];  Service:  Cardiovascular;  Laterality: Right;  . LOWER EXTREMITY ANGIOGRAPHY Right 07/27/2018   Procedure: RIGHT Lower Extremity Angiography;  Surgeon: Algernon Huxley, MD;  Location: [place] [facility];  Service: Cardiovascular;  Laterality: Right;  . LOWER EXTREMITY ANGIOGRAPHY Right 09/09/2018   Procedure: Lower Extremity Angiography;  Surgeon: Katha Cabal, MD;  Location: [place] [facility];  Service: Cardiovascular;  Laterality: Right;  . MASS EXCISION Right 07/18/2014   Procedure: EXCISION HETEROTOPIC BONE RIGHT HIP;  Surgeon: Frederik Pear, MD;  Location: [place];  Service: Orthopedics;  Laterality: Right;  . Open Heart Surgery  2010   x 3  . WOUND DEBRIDEMENT Right 10/09/2016   Procedure: DEBRIDEMENT WOUND;  Surgeon: Samara Deist, DPM;  Location: [facility];  Service: Podiatry;  Laterality: Right;    Prior to Admission medications   Medication Sig Start Date End Date Taking? Authorizing Provider  allopurinol (ZYLOPRIM) 100 MG tablet Take 1 tablet (100 mg total) by mouth daily. 08/02/18  Fritzi Mandes, MD  amiodarone (PACERONE) 100 MG tablet Take 100 mg by mouth daily.  01/15/18   [provider]  aspirin EC 81 MG tablet Take 1 tablet (81 mg total) by mouth daily. Patient not taking: Reported on 05/15/2019 10/21/16   Theora Gianotti, NP  calcium acetate (PHOSLO) 667 MG capsule Take 1,334 mg by mouth 3 (three) times daily. 12/20/18   [provider]  carvedilol (COREG) 3.125 MG tablet Take 1 tablet (3.125 mg total) by mouth 2 (two) times daily. 05/29/18   Loletha Grayer, MD  cephALEXin (KEFLEX) 500 MG capsule Take 1 capsule (500 mg total) by mouth 2 (two) times daily. Patient not taking: Reported on 05/02/2019 01/23/19   Kris Hartmann, NP  clopidogrel (PLAVIX) 75 MG tablet Take 1 tablet (75 mg total) by mouth daily with breakfast. Patient not taking: Reported on 05/02/2019 09/12/18   Mayo, Pete Pelt, MD  diclofenac Sodium (VOLTAREN) 1 % GEL Apply 4 g topically in the morning,  at noon, and at bedtime for 5 days. 07/04/19 07/09/19  Duffy Bruce, MD  dicyclomine (BENTYL) 10 MG capsule Take 1 capsule (10 mg total) by mouth 4 (four) times daily -  before meals and at bedtime. Patient not taking: Reported on 05/15/2019 10/11/18   Loletha Grayer, MD  ferrous sulfate 325 (65 FE) MG tablet Take 1 tablet (325 mg total) by mouth 2 (two) times daily with a meal. 01/02/19   Bettey Costa, MD  gabapentin (NEURONTIN) 100 MG capsule Take 1 capsule (100 mg total) by mouth 3 (three) times daily. 10/11/18   Loletha Grayer, MD  HYDROcodone-acetaminophen (NORCO) 5-325 MG tablet Take 1-2 tablets by mouth every 6 (six) hours as needed for moderate pain or severe pain. Patient not taking: Reported on 05/15/2019 05/11/19   Algernon Huxley, MD  loperamide (IMODIUM) 2 MG capsule Take 1 capsule (2 mg total) by mouth every 8 (eight) hours as needed for diarrhea or loose stools. 10/11/18   Loletha Grayer, MD  oxyCODONE-acetaminophen (PERCOCET) 5-325 MG tablet Take 1-2 tablets by mouth every 6 (six) hours as needed for moderate pain or severe pain. 07/04/19 07/03/20  Duffy Bruce, MD  predniSONE (DELTASONE) 5 MG tablet Take 1 tablet (5 mg total) by mouth daily with breakfast. 08/02/18   Fritzi Mandes, MD    Allergies Other, Ibuprofen, Baclofen, Metformin, and Nsaids  Family History  Problem Relation Age of Onset  . Hypertension Other   . Diabetes Other   . Diabetes Father   . Hyperlipidemia Father   . Hypertension Father   . Hyperlipidemia Sister   . Hypertension Sister   . Diabetes Sister   . Diabetes Brother   . Hypertension Brother   . Hyperlipidemia Brother     Social History Social History   Tobacco Use  . Smoking status: Never Smoker  . Smokeless tobacco: Never Used  Substance Use Topics  . Alcohol use: No  . Drug use: No    Review of Systems  Review of Systems  Constitutional: Negative for chills, fatigue and fever.  HENT: Negative for sore throat.   Respiratory: Negative for  shortness of breath.   Cardiovascular: Negative for chest pain.  Gastrointestinal: Negative for abdominal pain.  Genitourinary: Negative for flank pain.  Musculoskeletal: Positive for arthralgias. Negative for neck pain.  Skin: Positive for wound. Negative for rash.  Allergic/Immunologic: Negative for immunocompromised state.  Neurological: Negative for weakness and numbness.  Hematological: Does not bruise/bleed easily.     ____________________________________________  PHYSICAL EXAM:      VITAL SIGNS: ED Triage Vitals  Enc Vitals Group     BP 07/04/19 1345 (!) 141/69     Pulse Rate 07/04/19 1345 80     Resp 07/04/19 1345 20     Temp 07/04/19 1345 99 F (37.2 C)     Temp Source 07/04/19 1345 Oral     SpO2 07/04/19 1345 100 %     Weight 07/04/19 1346 190 lb (86.2 kg)     Height 07/04/19 1346 5\' 10"  (1.778 m)     Head Circumference --      Peak Flow --      Pain Score 07/04/19 1346 9     Pain Loc --      Pain Edu? --      Excl. in [place]? --      Physical Exam Vitals and nursing note reviewed.  Constitutional:      General: He is not in acute distress.    Appearance: He is well-developed.  HENT:     Head: Normocephalic and atraumatic.  Eyes:     Conjunctiva/sclera: Conjunctivae normal.  Cardiovascular:     Rate and Rhythm: Normal rate and regular rhythm.     Heart sounds: Normal heart sounds.  Pulmonary:     Effort: Pulmonary effort is normal. No respiratory distress.     Breath sounds: No wheezing.  Abdominal:     General: There is no distension.  Musculoskeletal:     Cervical back: Neck supple.  Skin:    General: Skin is warm.     Capillary Refill: Capillary refill takes less than 2 seconds.     Findings: No rash.  Neurological:     Mental Status: He is alert and oriented to person, place, and time.     Motor: No abnormal muscle tone.      UPPER EXTREMITY EXAM: RIGHT  INSPECTION & PALPATION: Right forearm AVF site with superficial wound breakdown along  medial aspect of fistula, without any purulence, drainage, or redness. No palpable thrill or audible hum over the fistula, both proximally and distally. Surgical site along upper arm c/d/i. Moderate TTP over olecranon and first MTP. No joint swelling, warmth. No significant pain with ROM.  SENSORY: Sensation is intact to light touch in:  Superficial radial nerve distribution (dorsal first web space) Median nerve distribution (tip of index finger)   Ulnar nerve distribution (tip of small finger)     MOTOR:  + Motor posterior interosseous nerve (thumb IP extension) + Anterior interosseous nerve (thumb IP flexion, index finger DIP flexion) + Radial nerve (wrist extension) + Median nerve (palpable firing thenar mass) + Ulnar nerve (palpable firing of first dorsal interosseous muscle)  VASCULAR: 2+ radial pulse Brisk capillary refill < 2 sec, fingers warm and well-perfused  ____________________________________________   LABS (all labs ordered are listed, but only abnormal results are displayed)  Labs Reviewed  CBC WITH DIFFERENTIAL/PLATELET - Abnormal; Notable for the following components:      Result Value   WBC 13.8 (*)    RBC 3.72 (*)    Hemoglobin 11.3 (*)    HCT 36.4 (*)    Neutro Abs 12.6 (*)    Lymphs Abs 0.4 (*)    Abs Immature Granulocytes 0.14 (*)    All other components within normal limits  COMPREHENSIVE METABOLIC PANEL - Abnormal; Notable for the following components:   CO2 19 (*)    Glucose, Bld 171 (*)    BUN 56 (*)  Creatinine, Ser 10.52 (*)    Calcium 8.0 (*)    AST 12 (*)    GFR calc non Af Amer 5 (*)    GFR calc Af Amer 6 (*)    All other components within normal limits  URIC ACID    ____________________________________________  EKG: Normal sinus rhythm, VR 81. PR 180, QRS 142, QTc 525. A-sensed V paced rhythm. No acute abnormality. ________________________________________  RADIOLOGY All imaging, including plain films, CT scans, and ultrasounds,  independently reviewed by me, and interpretations confirmed via formal radiology reads.  ED MD interpretation:   XR Elbow: Negative for acute abnormality, no joint effusion AV Fistula U/S: Occlusion of right AV graft, thrombus into the right axillary vein Official radiology report(s): DG Elbow Complete Right  Result Date: 07/04/2019 CLINICAL DATA:  Elbow pain EXAM: RIGHT ELBOW - COMPLETE 3+ VIEW COMPARISON:  None. FINDINGS: No fracture or malalignment. No significant elbow effusion. Vascular calcifications. IMPRESSION: No acute osseous abnormality. Electronically Signed   By: Donavan Foil M.D.   On: 07/04/2019 16:05   [place] Dialysis Access  Result Date: 07/04/2019 CLINICAL DATA:  54 year old with right arm and hand pain. History of right upper arm brachial artery to axillary vein AV graft. EXAM: ULTRASOUND DIALYSIS ACCESS TECHNIQUE: Grayscale and color Doppler imaging was obtained of the right upper arm AV graft. COMPARISON:  None. FINDINGS: No flow identified in the right upper arm AV graft. The brachial artery near the anastomosis is patent. The axillary vein adjacent to the venous anastomosis is patent. However, there appears to be thrombus extending into the axillary vein at the venous anastomosis. Right subclavian vein was not evaluated. Small amount of fluid around the graft near the venous anastomosis. IMPRESSION: Right upper arm AV graft is occluded. Thrombus extending into the right axillary vein at the venous anastomosis. Electronically Signed   By: Markus Daft M.D.   On: 07/04/2019 17:39    ____________________________________________  PROCEDURES   Procedure(s) performed (including Critical Care):  Procedures  ____________________________________________  INITIAL IMPRESSION / MDM / [place] / ED COURSE  As part of my medical decision making, I reviewed the following data within the [place] notes reviewed and incorporated, Old chart reviewed,  Notes from prior ED visits, and [place] Controlled Substance Paul Cuffe Jr. was evaluated in Emergency Department on 07/04/2019 for the symptoms described in the history of present illness. He was evaluated in the context of the global COVID-19 pandemic, which necessitated consideration that the patient might be at risk for infection with the SARS-CoV-2 virus that causes COVID-19. Institutional protocols and algorithms that pertain to the evaluation of patients at risk for COVID-19 are in a state of rapid change based on information released by regulatory bodies including the CDC and federal and state organizations. These policies and algorithms were followed during the patient's care in the ED.  Some ED evaluations and interventions may be delayed as a result of limited staffing during the pandemic.*  Clinical Course as of Jul 04 1838  Tue Jul 04, 2019  1731 Dispo delayed 2/2 waiting for U/S to be read. It seems it had difficulty crossing over to be read. Pt remains stable.   [CI]    Clinical Course User Index [CI] Duffy Bruce, MD    Medical Decision Making:  54 yo right handed male with h/o ESRD, CHF, gout, multiple medical problems here for c/o R elbow and hand pain, as well  as possible fistula malfunctoin. Re: his hand/elbow pain - his entire hand is warm, well perfused, without edema or signs of vascular compromise. He has a known, extensive h/o gout with similar pain and distribution and exam shows gouty tophi and mild pain over joint spaces and MTP of first digit, c/w likely gout exacerbation. No warmth or signs of septic arthritis. His mild leukocytosis is likely 2/2 his chronic prednisone use. Will check XR, AVF u/s to eval patency. He has had recurrent issues with the fistula and I do not suspect his pain is related to it at this time, though lack of thrill merits further investigation. He has a superficial area of breakdown but no signs of cellulitis or wound  infection.  Ultrasound shows occlusion of the graft with thrombus extending to the axillary vein.  Discussed with vascular surgery Dr. Delana Meyer who states patient does not need systemic anticoagulation at this time.  They will follow-up in the office.  Otherwise, will continue local wound care to his superficial skin breakdown, give single dose of Decadron to avoid persistent increase in his home prednisone, and try topical Voltaren gel.  Return precautions given. ____________________________________________  FINAL CLINICAL IMPRESSION(S) / ED DIAGNOSES  Final diagnoses:  A-V fistula ([place])     MEDICATIONS GIVEN DURING THIS VISIT:  Medications  dexamethasone (DECADRON) injection 10 mg (has no administration in time range)  oxyCODONE-acetaminophen (PERCOCET/ROXICET) 5-325 MG per tablet 2 tablet (2 tablets Oral Given 07/04/19 1626)     ED Discharge Orders         Ordered    diclofenac Sodium (VOLTAREN) 1 % GEL  3 times daily     07/04/19 1802    oxyCODONE-acetaminophen (PERCOCET) 5-325 MG tablet  Every 6 hours PRN     07/04/19 1803           Note:  This document was prepared using Dragon voice recognition software and may include unintentional dictation errors.   Duffy Bruce, MD 07/04/19 1840

## 2019-07-04 NOTE — Discharge Instructions (Signed)
Your fistula showed a clot at the graft. This will need to be fixed by Dr. Lucky Cowboy, likely in the [place].  Call Dr. Bunnie Domino office in the morning to set up follow-up THIS WEEK.  For your arm pain, we have given you a shot of steroids here to help. Continue your usual prednisone dose.  We have also prescribed a topical gel that will help with pain. Apply to your elbow and your hand joints. Do not apply near the fistula.

## 2019-07-04 NOTE — ED Notes (Addendum)
Pt does not have family or local transport. Pt uses [facility] for MWF dialysis and this transport service does not run at this time. Pt is being provided with cab voucher back to his residence from ED. Social service for hospital was contacted by Elmo Putt, Therapist, sports and was instructed to continue to use [facility] for medical care needs during the weekday.

## 2019-07-04 NOTE — ED Triage Notes (Signed)
Pt comes into the ED via EMS from home with c/o low b/p and concerns from the home health nurse (80/50) that his dialysis fistula is not working properly and wants it checked out. 164/78.  Pt is in NAD on arrival..

## 2019-07-11 ENCOUNTER — Ambulatory Visit: Payer: Medicare Other | Admitting: *Deleted

## 2019-07-18 ENCOUNTER — Encounter: Payer: Self-pay | Admitting: *Deleted

## 2019-07-18 ENCOUNTER — Other Ambulatory Visit: Payer: Self-pay | Admitting: *Deleted

## 2019-07-18 NOTE — Patient Outreach (Signed)
[facility]) Care Management [place] Telephone Outreach Care Coordination x 2   07/18/2019  Paul [name]. 1965-05-20 [id number]  Successful telephone outreach to Paul [name], 54 y/o maleselfreferred to [facility] he was previously active with [facility] CM.Patient has history including, but not limited to,ESRD/ new to hemodialysis this year;HTN, CAD, CHF, Diabetes (controlled, A1C - 7.9), and osteomyelitis, with recent surgical (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  HIPAA/ identity verified with patient; patient reports "doing okay, but not great;" states that he did obtain his prosthesis as planned at time of last outreach, but has had ongoing issues with arranging transportation through his established transportation services.  States that [facility] is difficult to schedule with, and confirms that he has used up all of his transportation benefits with his insurance provider, and is unable to schedule any more provider visits.  Reports missed "a couple" of hemodialysis sessions due to this; now states that he arranged to have ramp built by a friend, which has greatly helped, as he can now get to the street easily for [facility] to pick him up.  States that he is hoping that his transportation issues are now resolved, but he requests that I assist him in making PCP appointment and scheduling transportation to get there.  Patient also reports recent clinical issues with hemodialysis due to AV graft malfunction- states that hemodialysis team has been using his temporary graft access in his chest.  Reports that he has scheduled vascular appointment to assess graft this week on Thursday 3/18 and confirms that he has already scheduled transportation through [place].  Patient denies pain and new/ recent falls and sounds to be in no distress throughout phone call today.  Patient further  reports:  -- as noted above, patient has missed "a couple" hemodialysis appointments over the last few weeks, related to ongoing transportation issues/ AV graft issues and he reports that he has attended "partial" hemodialysis sessions this week now that he has his ramp in place and can get to the street for pick up; states he is currently fluid overloaded, but that hemodialysis team is managing fluid status/ overload and pulling off "all they can;" he denies shortness of breath and expects/ is hopeful that his fluid will be "back to normal" with tomorrow's session, which he is planning to attend- reports plans to adhere to hemodialysis as scheduled moving forward- this was of course, encouraged; hemodialysis sessions continue on M/ W/ F  -- home health team for PT ([facility]) active for rehabilitation in acclimating to new prosthesis- reports home health PT sessions going well, although he has not been able to apply his prosthesis this week due to fluid overload as noted above- active engagement with home health team encouraged  -- no concerns around medications; continues to self- manage; reports adherence to medications  -- accurate understanding of upcoming provider appointments; verbalizes plans to attend all-- transportation to all has been scheduled  [place] Coordination x 2:  Placed calls x 2 via 3-way calling to patient's PCP to schedule office visit, and then to [place] transportation to schedule transportation to visit: calls completed and office visit with transportation arranged.   Patient denies further issues, concerns, or problems today. I confirmed that patient hasmy direct phone number, the main [facility] CM office phone number, and the [facility] CM 24-hour nurse advice phone number should issues arise prior to  next scheduled [place] outreach.  Encouraged patient to contact me directly if needs, questions, issues, or concerns arise prior to next  scheduled outreach; patient agreed to do so.  Plan:  Patient will take medications as prescribed and will attend all scheduled provider appointmentsand hemodialysis sesisons  Patient will promptly notify care providers for any new concerns/ issues/ problems that arise  Patient will continue using assistive devices for fall prevention  [facility] CM outreach to continue with scheduled phone callnext month, unless indicated sooner  [facility] CM Care Plan Problem One     Most Recent Value  Care Plan Problem One  Multiple community resource needs in patient with ESRD new to hemodialysis and recent (R) BKA, as evidenced by patient reporting  Role Documenting the Problem One  Care Management Coordinator  Care Plan for Problem One  Active  [facility] Long Term Goal   Over the next 90 days, patient will verbalize plan for obtaining prosthesis for (R) BKA, as evidenced by patient reporting during [facility] RN CM outreach  [facility] Long Term Goal Start Date  04/25/19  [facility] Long Term Goal Met Date  07/18/19 Paul [name] met]  Interventions for Problem One Long Term Goal  Confirmed that patient has obtained prosthesis- positive reinforcement provided  [facility] CM Short Term Goal #1   Over the next 30 days, patient will attend all scheduled hemodialysis sessins and provider appointments, as evidenced by patient reporting and collaboration as indicated with care providers during [facility] RN CM outreach  [facility] CM Short Term Goal #1 Start Date  07/18/19  Interventions for Short Term Goal #1  Confirmed that patient has installed a ramp at his home and that his ongoing transportation issues have improved,  confirmed that patient has accurate understanding of scheduled upcoming provider appointments with plans to attend all,  assisted patient with care coordination efforts to schedule next PCP office visit and to schedule transportation for PCP visit  [facility] CM Short Term Goal #2   Over the next 30 days, patient will continue to actively  participate in home health PT services for rehabilitaion and education in proper use of new prosthesis, as evidenced by patient reporting and collaboration with home health team as indicated during [facility] RN CM outreach  [facility] CM Short Term Goal #2 Start Date  07/18/19  Interventions for Short Term Goal #2  Confirmed that patient has actively engaged with home health PT and reviewed recent home visits with patient,  confirmed that patient has contact information for home health team and encouraged his ongoing active participation     Oneta Rack, RN, BSN, [employer] Coordinator [facility] Care Management  [phone]

## 2019-07-20 ENCOUNTER — Ambulatory Visit (INDEPENDENT_AMBULATORY_CARE_PROVIDER_SITE_OTHER): Payer: Medicare Other

## 2019-07-20 ENCOUNTER — Ambulatory Visit (INDEPENDENT_AMBULATORY_CARE_PROVIDER_SITE_OTHER): Payer: Medicare Other | Admitting: Nurse Practitioner

## 2019-07-20 ENCOUNTER — Other Ambulatory Visit: Payer: Self-pay

## 2019-07-20 ENCOUNTER — Encounter (INDEPENDENT_AMBULATORY_CARE_PROVIDER_SITE_OTHER): Payer: Self-pay | Admitting: Nurse Practitioner

## 2019-07-20 ENCOUNTER — Telehealth (INDEPENDENT_AMBULATORY_CARE_PROVIDER_SITE_OTHER): Payer: Self-pay

## 2019-07-20 VITALS — BP 164/90 | HR 82 | Resp 16 | Wt 198.4 lb

## 2019-07-20 DIAGNOSIS — N186 End stage renal disease: Secondary | ICD-10-CM

## 2019-07-20 DIAGNOSIS — Z992 Dependence on renal dialysis: Secondary | ICD-10-CM | POA: Diagnosis not present

## 2019-07-20 DIAGNOSIS — S88111A Complete traumatic amputation at level between knee and ankle, right lower leg, initial encounter: Secondary | ICD-10-CM

## 2019-07-20 DIAGNOSIS — T8149XA Infection following a procedure, other surgical site, initial encounter: Secondary | ICD-10-CM | POA: Diagnosis not present

## 2019-07-20 NOTE — Progress Notes (Signed)
SUBJECTIVE:  Patient ID: Paul Ala., male    DOB: 12-18-1965, 54 y.o.   MRN: [id number] Chief Complaint  Patient presents with  . Follow-up    add on wound check and ultrasound    HPI  Paul Depass. is a 54 y.o. male the presents today for noninvasive studies as well as check of his right brachial axillary graft incision site.  Initially the patient presented to the emergency room on 06/29/2019 related to an open wound as well as possible infection.  Since that time the patient has gotten antibiotics from dialysis and the wound is essentially healed at this point.  However, the patient states that has been approximately 3 weeks since he has been able to feel a thrill or hear a bruit in his left brachial axillary graft.  He has been using PermCath without difficulty.  He denies any fever, chills, nausea, vomiting or diarrhea.  Today noninvasive studies confirmed that the patient has an occluded brachial axillary graft.  Past Medical History:  Diagnosis Date  . Anemia   . Cardiac defibrillator in place    a. Biotronik LUmax 540 DRT, (ser # [id number]).  . Carotid arterial disease ([place])    a. s/p prior LICA stenting;  b. 11/5641 Carotid U/S: 40-59% bilat ICA stenosis. Patent LICA stent.  . CHF (congestive heart failure) ([place])   . CKD (chronic kidney disease), stage III   . Coronary artery disease    a. 2010 s/p CABG x 3.  . DDD (degenerative disc disease), lumbosacral    L5-S1  . Diabetes ([place])    Lantus at bedtime  . Gangrene of toe of right foot ([place])   . Gout of left hand 10/06/2016  . HFrEF (heart failure with reduced ejection fraction) ([place])    a. 07/2016 Echo: EF 25-30%, diff HK, mild MR, mildly dil LA, mod reduced RV fxn, PASP 42mmHg.  Marland Kitchen Hypertension    takes Coreg daily  . Ischemic cardiomyopathy    a. 07/2016 Echo: EF 25-30%, diff HK.  . Myocardial infarction ([place]) 2009  . Peripheral vascular disease ([place])   . Sleep apnea    sleep study yr ago-unable to afford  cpap  . Stroke [facility]) 09   no weakness    Past Surgical History:  Procedure Laterality Date  . AMPUTATION Right 12/23/2018   Procedure: AMPUTATION BELOW KNEE (Right);  Surgeon: Algernon Huxley, MD;  Location: [facility] ORS;  Service: General;  Laterality: Right;  . AMPUTATION TOE Right 08/22/2016   Procedure: AMPUTATION TOE;  Surgeon: Samara Deist, DPM;  Location: [facility] ORS;  Service: Podiatry;  Laterality: Right;  . AMPUTATION TOE Right 10/09/2016   Procedure: AMPUTATION TOE-RIGHT 2ND MPJ;  Surgeon: Samara Deist, DPM;  Location: [facility] ORS;  Service: Podiatry;  Laterality: Right;  . APLIGRAFT PLACEMENT Right 10/09/2016   Procedure: APLIGRAFT PLACEMENT;  Surgeon: Samara Deist, DPM;  Location: [facility] ORS;  Service: Podiatry;  Laterality: Right;  . AV FISTULA PLACEMENT Right 05/11/2019   Procedure: INSERTION OF ARTERIOVENOUS (AV) GORE-TEX GRAFT ARM;  Surgeon: Algernon Huxley, MD;  Location: [facility] ORS;  Service: Vascular;  Laterality: Right;  . CALCANEAL OSTEOTOMY Bilateral 07/28/2018   Procedure: RIGHT CALCANECTOMY BILATERAL DEBRIDEMENT OF ULCERS ON HEELS;  Surgeon: Albertine Patricia, DPM;  Location: [facility] ORS;  Service: Podiatry;  Laterality: Bilateral;  . CARDIAC DEFIBRILLATOR PLACEMENT  2011  . CAROTID ENDARTERECTOMY Left   . CHOLECYSTECTOMY  2010  . CORONARY ARTERY BYPASS GRAFT  2010   CABG  x 3   . DIALYSIS/PERMA CATHETER INSERTION N/A 10/07/2018   Procedure: DIALYSIS/PERMA CATHETER INSERTION;  Surgeon: Katha Cabal, MD;  Location: [place] CV LAB;  Service: Cardiovascular;  Laterality: N/A;  . GRAFT APPLICATION Right 9/47/0962   Procedure: FULL THICKNESS SKIN GRAFT-RIGHT FOOT;  Surgeon: Algernon Huxley, MD;  Location: [facility] ORS;  Service: Vascular;  Laterality: Right;  . HIP SURGERY Right 1994  . INCISION AND DRAINAGE Right 09/08/2018   Procedure: INCISION AND DRAINAGE - [place] OF DEFECTIVE SKIN, SOFT TISSUE AND BONE;  Surgeon: Albertine Patricia, DPM;  Location: [facility] ORS;  Service: Podiatry;   Laterality: Right;  . INCISION AND DRAINAGE OF WOUND Right 08/22/2016   Procedure: IRRIGATION AND DEBRIDEMENT WOUND and wound vac placement;  Surgeon: Samara Deist, DPM;  Location: [facility] ORS;  Service: Podiatry;  Laterality: Right;  . IRRIGATION AND DEBRIDEMENT ABSCESS Right 11/24/2018   Procedure: IRRIGATION AND DEBRIDEMENT ABSCESS RIGHT FOOT, COMPLICATED, DIABETIC;  Surgeon: Albertine Patricia, DPM;  Location: [facility] OR[facility];  Service: Podiatry;  Laterality: Right;  . LOWER EXTREMITY ANGIOGRAPHY Right 08/24/2016   Procedure: Lower Extremity Angiography;  Surgeon: Algernon Huxley, MD;  Location: [place] CV LAB;  Service: Cardiovascular;  Laterality: Right;  . LOWER EXTREMITY ANGIOGRAPHY Right 07/27/2018   Procedure: RIGHT Lower Extremity Angiography;  Surgeon: Algernon Huxley, MD;  Location: [place] CV LAB;  Service: Cardiovascular;  Laterality: Right;  . LOWER EXTREMITY ANGIOGRAPHY Right 09/09/2018   Procedure: Lower Extremity Angiography;  Surgeon: Katha Cabal, MD;  Location: [place] CV LAB;  Service: Cardiovascular;  Laterality: Right;  . MASS EXCISION Right 07/18/2014   Procedure: EXCISION HETEROTOPIC BONE RIGHT HIP;  Surgeon: Frederik Pear, MD;  Location: [place];  Service: Orthopedics;  Laterality: Right;  . Open Heart Surgery  2010   x 3  . WOUND DEBRIDEMENT Right 10/09/2016   Procedure: DEBRIDEMENT WOUND;  Surgeon: Samara Deist, DPM;  Location: [facility] ORS;  Service: Podiatry;  Laterality: Right;    Social History   Socioeconomic History  . Marital status: Legally Separated    Spouse name: Not on file  . Number of children: Not on file  . Years of education: Not on file  . Highest education level: Not on file  Occupational History  . Not on file  Tobacco Use  . Smoking status: Never Smoker  . Smokeless tobacco: Never Used  Substance and Sexual Activity  . Alcohol use: No  . Drug use: No  . Sexual activity: Not on file  Other Topics Concern  . Not on file  Social History  Narrative  . Not on file   Social Determinants of Health   Financial Resource Strain: Low Risk   . Difficulty of Paying Living Expenses: Not very hard  Food Insecurity: Food Insecurity Present  . Worried About Charity fundraiser in the Last Year: Sometimes true  . Ran Out of Food in the Last Year: Never true  Transportation Needs: Unmet Transportation Needs  . Lack of Transportation (Medical): Yes  . Lack of Transportation (Non-Medical): Yes  Physical Activity:   . Days of Exercise per Week:   . Minutes of Exercise per Session:   Stress: No Stress Concern Present  . Feeling of Stress : Not at all  Social Connections:   . Frequency of Communication with Friends and Family:   . Frequency of Social Gatherings with Friends and Family:   . Attends Religious Services:   . Active Member of Clubs or Organizations:   .  Attends Archivist Meetings:   Marland Kitchen Marital Status:   Intimate Partner Violence:   . Fear of Current or Ex-Partner:   . Emotionally Abused:   Marland Kitchen Physically Abused:   . Sexually Abused:     Family History  Problem Relation Age of Onset  . Hypertension Other   . Diabetes Other   . Diabetes Father   . Hyperlipidemia Father   . Hypertension Father   . Hyperlipidemia Sister   . Hypertension Sister   . Diabetes Sister   . Diabetes Brother   . Hypertension Brother   . Hyperlipidemia Brother     Allergies  Allergen Reactions  . Other Other (See Comments)    Cardiac Problems. Pt states he tolerates Toradol. Due to kidney and heart problems per pt  . Ibuprofen Other (See Comments)    Heart problems  . Baclofen Other (See Comments)  . Metformin Diarrhea  . Nsaids     Due to kidney and heart problems per pt     Review of Systems   Review of Systems: Negative Unless Checked Constitutional: [] Weight loss  [] Fever  [] Chills Cardiac: [] Chest pain   []  Atrial Fibrillation  [] Palpitations   [] Shortness of breath when laying flat   [] Shortness of breath  with exertion. [] Shortness of breath at rest Vascular:  [] Pain in legs with walking   [] Pain in legs with standing [] Pain in legs when laying flat   [] Claudication    [] Pain in feet when laying flat    [] History of DVT   [] Phlebitis   [] Swelling in legs   [] Varicose veins   [] Non-healing ulcers Pulmonary:   [] Uses home oxygen   [] Productive cough   [] Hemoptysis   [] Wheeze  [] COPD   [] Asthma Neurologic:  [] Dizziness   [] Seizures  [] Blackouts [] History of stroke   [] History of TIA  [] Aphasia   [] Temporary Blindness   [] Weakness or numbness in arm   [x] Weakness or numbness in leg Musculoskeletal:   [] Joint swelling   [] Joint pain   [] Low back pain  []  History of Knee Replacement [] Arthritis [] back Surgeries  []  Spinal Stenosis    Hematologic:  [] Easy bruising  [] Easy bleeding   [] Hypercoagulable state   [x] Anemic Gastrointestinal:  [] Diarrhea   [] Vomiting  [] Gastroesophageal reflux/heartburn   [] Difficulty swallowing. [] Abdominal pain Genitourinary:  [x] Chronic kidney disease   [] Difficult urination  [] Anuric   [] Blood in urine [] Frequent urination  [] Burning with urination   [] Hematuria Skin:  [] Rashes   [] Ulcers [] Wounds Psychological:  [] History of anxiety   []  History of major depression  []  Memory Difficulties      OBJECTIVE:   Physical Exam  BP (!) 164/90 (BP Location: Left Arm)   Pulse 82   Resp 16   Wt 198 lb 6.6 oz (90 kg)   BMI 28.47 kg/m   Gen: WD/WN, NAD Head: [place]/AT, No temporalis wasting.  Ear/Nose/Throat: Hearing grossly intact, nares w/o erythema or drainage Eyes: PER, EOMI, sclera nonicteric.  Neck: Supple, no masses.  No JVD.  Pulmonary:  Good air movement, no use of accessory muscles.  Cardiac: RRR Vascular: right below knee amputation.   Vessel Right Left  Radial Palpable Palpable   Gastrointestinal: soft, non-distended. No guarding/no peritoneal signs.  Musculoskeletal: M/S 5/5 throughout.  No deformity or atrophy.  Neurologic: Pain and light touch intact in  extremities.  Symmetrical.  Speech is fluent. Motor exam as listed above. Psychiatric: Judgment intact, Mood & affect appropriate for pt's clinical situation. Dermatologic: No Venous rashes. No  Ulcers Noted.  No changes consistent with cellulitis. Lymph : No Cervical lymphadenopathy, no lichenification or skin changes of chronic lymphedema.       ASSESSMENT AND PLAN:  1. ESRD on dialysis [facility]) Recommend:  The patient is experiencing increasing problems with their dialysis access.  Patient should have a thrombectomy with the intention for intervention.  The intention for intervention is to restore appropriate flow and prevent thrombosis and possible loss of the access.  As well as improve the quality of dialysis therapy.  The risks, benefits and alternative therapies were reviewed in detail with the patient.  All questions were answered.  The patient agrees to proceed with angio/intervention.    Also, discussed with patient that due to length of time that dialysis access has been thrombosed, the graft may not be salvageable and that a new access may need to be planned.     2. Below-knee amputation of right lower extremity (HCC) Healed.  No further intervention at this time.   3. Surgical wound infection Wound present at fistula insertion has healed at this time. No further intervention   Current Outpatient Medications on File Prior to Visit  Medication Sig Dispense Refill  . allopurinol (ZYLOPRIM) 100 MG tablet Take 1 tablet (100 mg total) by mouth daily. 30 tablet 0  . amiodarone (PACERONE) 100 MG tablet Take 100 mg by mouth daily.     . calcium acetate (PHOSLO) 667 MG capsule Take 1,334 mg by mouth 3 (three) times daily.    . carvedilol (COREG) 3.125 MG tablet Take 1 tablet (3.125 mg total) by mouth 2 (two) times daily. 60 tablet 0  . ferrous sulfate 325 (65 FE) MG tablet Take 1 tablet (325 mg total) by mouth 2 (two) times daily with a meal.  3  . gabapentin (NEURONTIN) 100 MG  capsule Take 1 capsule (100 mg total) by mouth 3 (three) times daily. 90 capsule 0  . loperamide (IMODIUM) 2 MG capsule Take 1 capsule (2 mg total) by mouth every 8 (eight) hours as needed for diarrhea or loose stools. 10 capsule 0  . oxyCODONE-acetaminophen (PERCOCET) 5-325 MG tablet Take 1-2 tablets by mouth every 8 (eight) hours as needed for moderate pain or severe pain. 15 tablet 0  . predniSONE (DELTASONE) 5 MG tablet Take 1 tablet (5 mg total) by mouth daily with breakfast. 40 tablet 0  . aspirin EC 81 MG tablet Take 1 tablet (81 mg total) by mouth daily. (Patient not taking: Reported on 05/15/2019) 90 tablet 3  . cephALEXin (KEFLEX) 500 MG capsule Take 1 capsule (500 mg total) by mouth 2 (two) times daily. (Patient not taking: Reported on 05/02/2019) 20 capsule 0  . clopidogrel (PLAVIX) 75 MG tablet Take 1 tablet (75 mg total) by mouth daily with breakfast. (Patient not taking: Reported on 05/02/2019) 30 tablet 0  . dicyclomine (BENTYL) 10 MG capsule Take 1 capsule (10 mg total) by mouth 4 (four) times daily -  before meals and at bedtime. (Patient not taking: Reported on 05/15/2019) 120 capsule 0  . HYDROcodone-acetaminophen (NORCO) 5-325 MG tablet Take 1-2 tablets by mouth every 6 (six) hours as needed for moderate pain or severe pain. (Patient not taking: Reported on 05/15/2019) 30 tablet 0   No current facility-administered medications on file prior to visit.    There are no Patient Instructions on file for this visit. No follow-ups on file.   Kris Hartmann, NP  This note was completed with Sales executive.  Any errors are  purely unintentional.

## 2019-07-20 NOTE — Telephone Encounter (Signed)
Spoke with Elta Guadeloupe at [facility], the patient is now scheduled with Dr. Lucky Cowboy for a right arm thrombectomy on 07/26/19 with a 10:15 am arrival time to the MM. Patient will do covid testing on 07/24/19 between 8-1 pm at the [place]. Pre-procedure instructions will be faxed to [facility] at [facility] to give to the patient and set up transportation.

## 2019-07-24 ENCOUNTER — Other Ambulatory Visit: Payer: Self-pay | Admitting: *Deleted

## 2019-07-24 ENCOUNTER — Inpatient Hospital Stay: Admission: RE | Admit: 2019-07-24 | Payer: Medicare Other | Source: Ambulatory Visit

## 2019-07-24 NOTE — Patient Outreach (Signed)
[facility]) Care Management [place] Telephone Outreach Care Coordination  07/24/2019  Paul [name] [birth year] [id number]  Received incoming voice message form patient requesting call back; returned patient's call within 30 minutes of his call to me:  Successful telephone outreach to Paul [name], 54 y/o maleselfreferred to [facility] he was previously active with [facility] CM.Patient has history including, but not limited to,ESRD/ new to hemodialysis this year;HTN, CAD, CHF, Diabetes (controlled, A1C - 7.9), and osteomyelitis, with recent surgical (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  HIPAA/ identity verified with patient; today, patient reports having technical difficulty with his Hover-Round mobility scooter; states that he has attempted to arrange service call with [facility] company and reports that the representative will not assist him in having a service call scheduled/ speak to supervisor.  Patient states he is afraid he will need to miss hemodialysis if the scooter is not promptly fixed.  Discussed with patient option of my placing 3-way call to [facility] company to facilitate having this request/ issue addressed, however, patient adamantly declines this offer stating that he does not wish to talk to the representative again, as he is angry and upset with the representative's response with previous calls he has made today; explained to patient that I would need to place 3-way call with patient on the line to schedule any definitive service, around his schedule, however, patient declines this option numerous times.  Discussed other options to contact his insurance company for facilitation, as he reports that his insurance company provided the scooter; we also discussed possibility of patient having his home health PT assist in facilitation of this, and patient is agreeable to contacting  the home health PT and his insurance company if necessary, and he declines further needs.  Patient also reports that he attended hemodialysis today and is finally back at his goal weight; he also reports that he has been able to gradually resume using his prosthesis.  Positive reinforcement provided.  He denies further needs today.  Plan:  [facility] RN CM outreach to continue as previously scheduled unless otherwise indicated  Oneta Rack, RN, BSN, [place] Care Management  [phone]

## 2019-07-25 ENCOUNTER — Other Ambulatory Visit (INDEPENDENT_AMBULATORY_CARE_PROVIDER_SITE_OTHER): Payer: Self-pay | Admitting: Nurse Practitioner

## 2019-07-25 ENCOUNTER — Encounter (INDEPENDENT_AMBULATORY_CARE_PROVIDER_SITE_OTHER): Payer: Self-pay

## 2019-07-25 NOTE — Telephone Encounter (Signed)
Patient missed his covid testing on 07/24/19 and has now been rescheduled to 07/31/19 with a 9:00 am arrival time to the MM. Patient will do covid testing on 07/28/19 between 8-1 pm at the [place] after his dialysis. Patient was rescheduled due to transportation issues. Pre-procedure instructions will be faxed to [facility] as I was in communication with Doctor, general practice at [place].

## 2019-07-28 ENCOUNTER — Other Ambulatory Visit
Admission: RE | Admit: 2019-07-28 | Discharge: 2019-07-28 | Disposition: A | Discharge status: Home / Self Care | Payer: Medicare Other | Source: Ambulatory Visit | Attending: Vascular Surgery | Admitting: Vascular Surgery

## 2019-07-28 DIAGNOSIS — Z20822 Contact with and (suspected) exposure to covid-19: Secondary | ICD-10-CM | POA: Diagnosis not present

## 2019-07-28 DIAGNOSIS — Z01812 Encounter for preprocedural laboratory examination: Secondary | ICD-10-CM | POA: Diagnosis present

## 2019-07-28 LAB — SARS CORONAVIRUS 2 (TAT 6-24 HRS): SARS Coronavirus 2: NEGATIVE

## 2019-07-31 ENCOUNTER — Other Ambulatory Visit: Payer: Self-pay

## 2019-07-31 ENCOUNTER — Encounter
Admission: RE | Disposition: A | Discharge status: Home / Self Care | Payer: Self-pay | Source: Home / Self Care | Attending: Vascular Surgery

## 2019-07-31 ENCOUNTER — Encounter: Payer: Self-pay | Admitting: Vascular Surgery

## 2019-07-31 ENCOUNTER — Ambulatory Visit
Admission: RE | Admit: 2019-07-31 | Discharge: 2019-07-31 | Disposition: A | Discharge status: Home / Self Care | Payer: Medicare Other | Attending: Vascular Surgery | Admitting: Vascular Surgery

## 2019-07-31 DIAGNOSIS — Z951 Presence of aortocoronary bypass graft: Secondary | ICD-10-CM | POA: Insufficient documentation

## 2019-07-31 DIAGNOSIS — M109 Gout, unspecified: Secondary | ICD-10-CM | POA: Diagnosis not present

## 2019-07-31 DIAGNOSIS — Y841 Kidney dialysis as the cause of abnormal reaction of the patient, or of later complication, without mention of misadventure at the time of the procedure: Secondary | ICD-10-CM | POA: Insufficient documentation

## 2019-07-31 DIAGNOSIS — E1122 Type 2 diabetes mellitus with diabetic chronic kidney disease: Secondary | ICD-10-CM | POA: Diagnosis not present

## 2019-07-31 DIAGNOSIS — I6523 Occlusion and stenosis of bilateral carotid arteries: Secondary | ICD-10-CM | POA: Diagnosis not present

## 2019-07-31 DIAGNOSIS — Z992 Dependence on renal dialysis: Secondary | ICD-10-CM

## 2019-07-31 DIAGNOSIS — I255 Ischemic cardiomyopathy: Secondary | ICD-10-CM | POA: Insufficient documentation

## 2019-07-31 DIAGNOSIS — Z95810 Presence of automatic (implantable) cardiac defibrillator: Secondary | ICD-10-CM | POA: Insufficient documentation

## 2019-07-31 DIAGNOSIS — I132 Hypertensive heart and chronic kidney disease with heart failure and with stage 5 chronic kidney disease, or end stage renal disease: Secondary | ICD-10-CM | POA: Diagnosis not present

## 2019-07-31 DIAGNOSIS — N186 End stage renal disease: Secondary | ICD-10-CM | POA: Insufficient documentation

## 2019-07-31 DIAGNOSIS — Z8673 Personal history of transient ischemic attack (TIA), and cerebral infarction without residual deficits: Secondary | ICD-10-CM | POA: Diagnosis not present

## 2019-07-31 DIAGNOSIS — I2510 Atherosclerotic heart disease of native coronary artery without angina pectoris: Secondary | ICD-10-CM | POA: Diagnosis not present

## 2019-07-31 DIAGNOSIS — G4730 Sleep apnea, unspecified: Secondary | ICD-10-CM | POA: Insufficient documentation

## 2019-07-31 DIAGNOSIS — T82868A Thrombosis of vascular prosthetic devices, implants and grafts, initial encounter: Secondary | ICD-10-CM | POA: Insufficient documentation

## 2019-07-31 DIAGNOSIS — I509 Heart failure, unspecified: Secondary | ICD-10-CM | POA: Insufficient documentation

## 2019-07-31 DIAGNOSIS — D649 Anemia, unspecified: Secondary | ICD-10-CM | POA: Diagnosis not present

## 2019-07-31 DIAGNOSIS — I252 Old myocardial infarction: Secondary | ICD-10-CM | POA: Insufficient documentation

## 2019-07-31 DIAGNOSIS — E1151 Type 2 diabetes mellitus with diabetic peripheral angiopathy without gangrene: Secondary | ICD-10-CM | POA: Diagnosis not present

## 2019-07-31 DIAGNOSIS — Z7902 Long term (current) use of antithrombotics/antiplatelets: Secondary | ICD-10-CM | POA: Diagnosis not present

## 2019-07-31 DIAGNOSIS — Z7982 Long term (current) use of aspirin: Secondary | ICD-10-CM | POA: Diagnosis not present

## 2019-07-31 DIAGNOSIS — Z89511 Acquired absence of right leg below knee: Secondary | ICD-10-CM | POA: Insufficient documentation

## 2019-07-31 HISTORY — PX: PERIPHERAL VASCULAR THROMBECTOMY: CATH118306

## 2019-07-31 LAB — POTASSIUM (ARMC VASCULAR LAB ONLY): Potassium (ARMC vascular lab): 4.6 (ref 3.5–5.1)

## 2019-07-31 LAB — GLUCOSE, CAPILLARY
Glucose-Capillary: 99 mg/dL (ref 70–99)
Glucose-Capillary: 96 mg/dL (ref 70–99)

## 2019-07-31 SURGERY — PERIPHERAL VASCULAR THROMBECTOMY
Anesthesia: Moderate Sedation | Laterality: Right

## 2019-07-31 MED ORDER — FAMOTIDINE 20 MG PO TABS: 40.0000 mg | ORAL_TABLET | Freq: Once | ORAL | Status: DC | PRN

## 2019-07-31 MED ORDER — CLOPIDOGREL BISULFATE 75 MG PO TABS: 75.0000 mg | ORAL_TABLET | Freq: Every day | ORAL | 0 refills | Status: DC

## 2019-07-31 MED ORDER — FENTANYL CITRATE (PF) 100 MCG/2ML IJ SOLN
INTRAMUSCULAR | Status: DC | PRN
  Administered 2019-07-31: 50 ug via INTRAVENOUS
  Administered 2019-07-31: 25 ug via INTRAVENOUS
  Administered 2019-07-31: 50 ug via INTRAVENOUS

## 2019-07-31 MED ORDER — ALTEPLASE 2 MG IJ SOLR
INTRAMUSCULAR | Status: AC
  Filled 2019-07-31: qty 4

## 2019-07-31 MED ORDER — HYDROMORPHONE HCL 1 MG/ML IJ SOLN: 1.0000 mg | Freq: Once | INTRAMUSCULAR | Status: DC | PRN

## 2019-07-31 MED ORDER — MIDAZOLAM HCL 2 MG/ML PO SYRP: 8.0000 mg | ORAL_SOLUTION | Freq: Once | ORAL | Status: DC | PRN

## 2019-07-31 MED ORDER — CEFAZOLIN SODIUM-DEXTROSE 1-4 GM/50ML-% IV SOLN
INTRAVENOUS | Status: AC
  Filled 2019-07-31: qty 50

## 2019-07-31 MED ORDER — IODIXANOL 320 MG/ML IV SOLN
INTRAVENOUS | Status: DC | PRN
  Administered 2019-07-31: 12:00:00 35 mL via INTRAVENOUS

## 2019-07-31 MED ORDER — HEPARIN SODIUM (PORCINE) 1000 UNIT/ML IJ SOLN
INTRAMUSCULAR | Status: DC | PRN
  Administered 2019-07-31: 4000 [IU] via INTRAVENOUS

## 2019-07-31 MED ORDER — CEFAZOLIN SODIUM-DEXTROSE 1-4 GM/50ML-% IV SOLN
1.0000 g | Freq: Once | INTRAVENOUS | Status: AC
  Administered 2019-07-31: 1 g via INTRAVENOUS

## 2019-07-31 MED ORDER — MIDAZOLAM HCL 2 MG/2ML IJ SOLN
INTRAMUSCULAR | Status: DC | PRN
  Administered 2019-07-31: 2 mg via INTRAVENOUS
  Administered 2019-07-31: 0.5 mg via INTRAVENOUS
  Administered 2019-07-31: 1 mg via INTRAVENOUS

## 2019-07-31 MED ORDER — FENTANYL CITRATE (PF) 100 MCG/2ML IJ SOLN
INTRAMUSCULAR | Status: AC
  Filled 2019-07-31: qty 2

## 2019-07-31 MED ORDER — ASPIRIN EC 81 MG PO TBEC: 81.0000 mg | DELAYED_RELEASE_TABLET | Freq: Every day | ORAL | 3 refills | Status: DC

## 2019-07-31 MED ORDER — MIDAZOLAM HCL 5 MG/5ML IJ SOLN
INTRAMUSCULAR | Status: AC
  Filled 2019-07-31: qty 5

## 2019-07-31 MED ORDER — METHYLPREDNISOLONE SODIUM SUCC 125 MG IJ SOLR: 125.0000 mg | Freq: Once | INTRAMUSCULAR | Status: DC | PRN

## 2019-07-31 MED ORDER — ONDANSETRON HCL 4 MG/2ML IJ SOLN: 4.0000 mg | Freq: Four times a day (QID) | INTRAMUSCULAR | Status: DC | PRN

## 2019-07-31 MED ORDER — ALTEPLASE 2 MG IJ SOLR
INTRAMUSCULAR | Status: DC | PRN
  Administered 2019-07-31: 4 mg

## 2019-07-31 MED ORDER — DIPHENHYDRAMINE HCL 50 MG/ML IJ SOLN: 50.0000 mg | Freq: Once | INTRAMUSCULAR | Status: DC | PRN

## 2019-07-31 MED ORDER — SODIUM CHLORIDE 0.9 % IV SOLN: INTRAVENOUS | Status: DC

## 2019-07-31 SURGICAL SUPPLY — 19 items
BALLN ULTRVRSE 7X150X75 (BALLOONS) ×3
BALLOON ULTRVRSE 7X150X75 (BALLOONS) ×1 IMPLANT
CANNULA 5F STIFF (CANNULA) ×3 IMPLANT
CATH BEACON 5 .035 40 KMP TP (CATHETERS) ×1 IMPLANT
CATH BEACON 5 .038 40 KMP TP (CATHETERS) ×2
CATH EMBOLECTOMY 5FR (BALLOONS) ×3 IMPLANT
CATH INDIGO 7D KIT (CATHETERS) ×3 IMPLANT
COVER PROBE U/S 5X48 (MISCELLANEOUS) ×3 IMPLANT
DEVICE PRESTO INFLATION (MISCELLANEOUS) ×3 IMPLANT
DRAPE BRACHIAL (DRAPES) ×6 IMPLANT
PACK ANGIOGRAPHY (CUSTOM PROCEDURE TRAY) ×3 IMPLANT
SHEATH BRITE TIP 6FRX5.5 (SHEATH) ×6 IMPLANT
SHEATH BRITE TIP 7FRX5.5 (SHEATH) ×3 IMPLANT
STENT VIABAHN 8X150X120 (Permanent Stent) ×2 IMPLANT
STENT VIABAHN 8X15X120 7FR (Permanent Stent) ×1 IMPLANT
SUT MNCRL AB 4-0 PS2 18 (SUTURE) ×3 IMPLANT
SYR MEDRAD MARK 7 150ML (SYRINGE) ×3 IMPLANT
WIRE G 018X200 V18 (WIRE) ×3 IMPLANT
WIRE MAGIC TOR.035 180C (WIRE) ×6 IMPLANT

## 2019-07-31 NOTE — H&P (Signed)
[facility] History & Physical Update  The patient was interviewed and re-examined.  The patient's previous History and Physical has been reviewed and is unchanged.  There is no change in the plan of care. We plan to proceed with the scheduled procedure.  Leotis Pain, MD  07/31/2019, 9:27 AM

## 2019-07-31 NOTE — Op Note (Signed)
[facility]    OPERATIVE NOTE   PROCEDURE: 1.  Right brachial artery to axillary vein arteriovenous graft cannulation under ultrasound guidance in both a retrograde and then antegrade fashion crossing 2.  Right arm shuntogram and central venogram 3.  Catheter directed thrombolysis with 4 mg of TPA  4.  Mechanical thrombectomy to the right brachial artery to axillary vein AV graft with the penumbra CAT 7D catheter 5.  Fogarty embolectomy for residual arterial plug 6.  Viabahn covered stent placement to the mid to distal graft, venous anastomosis, and axillary vein just beyond the venous anastomosis with an 8 mm diameter by 15 cm length stent.  PRE-OPERATIVE DIAGNOSIS: 1. ESRD 2.  Thrombosed right brachial artery to axillary vein arteriovenous graft  POST-OPERATIVE DIAGNOSIS: same as above   SURGEON: Leotis Pain, MD  ANESTHESIA: local with Moderate Conscious Sedation for approximately 25 minutes using 3.5 mg of Versed and 75 mcg of Fentanyl  ESTIMATED BLOOD LOSS: 125 cc  FINDING(S): 1. Thrombosed graft, tight narrowing of the venous anastomosis with extravasation seen after thrombectomy.  SPECIMEN(S):  None  CONTRAST: 35 cc  FLUORO TIME: 4 minutes  INDICATIONS: Patient is a 54 y.o.male who presents with a thrombosed right brachial artery to axillary vein arteriovenous graft.  The patient is scheduled for an attempted declot and shuntogram.  The patient is aware the risks include but are not limited to: bleeding, infection, thrombosis of the cannulated access, and possible anaphylactic reaction to the contrast.  The patient is aware of the risks of the procedure and elects to proceed forward.  DESCRIPTION: After full informed written consent was obtained, the patient was brought back to the angiography suite and placed supine upon the angiography table.  The patient was connected to monitoring equipment. Moderate conscious sedation was administered with a face  to face encounter with the patient throughout the procedure with my supervision of the RN administering medicines and monitoring the patient's vital signs, pulse oximetry, telemetry and mental status throughout from the start of the procedure until the patient was taken to the recovery room. The right arm was prepped and draped in the standard fashion for a percutaneous access intervention.  Under ultrasound guidance, the right brachial artery to axillary vein arteriovenous graft was cannulated with a micropuncture needle under direct ultrasound guidance due to the pulseless nature of the graft in both an antegrade and a retrograde fashion crossing, and permanent images were performed.  The microwire was advanced and the needle was exchanged for the a microsheath.  I then upsized to a 6 Fr Sheath and imaging was performed.  Hand injections were completed to image the access including the central venous system. This demonstrated no flow within the AV graft.  Based on the images, this patient will need extensive treatment to salvage the graft. I then gave the patient 4000 units of intravenous heparin.  I then placed a Magic torque wire into the brachial artery from the retrograde sheath and into the axillary vein from the antegrade sheath. 4 mg of TPA were deployed throughout the graft. This was allowed to dwell.  I upsized to a 7 [place] sheath for the antegrade sheath.  Mechanical thrombectomy was then performed throughout the graft and into the axillary vein with the penumbra CAT 7D catheter.  A residual arterial plug was also seen at the arterial anastomosis. An attempt to clear the arterial plug was done with three passes of the Fogarty embolectomy balloon. This resulted in resolution of  the arterial plug, and clearance of the arterial side of the graft. The arterial outflow was seen to be intact through the radial and ulnar arteries as well on these images.  Of note, this was off the radial artery as there was  a very high brachial bifurcation with a separate ulnar artery 10 to 15 cm superior to the anastomosis.  The retrograde sheath was removed. I then turned my attention to the thrombus in the distal graft and the axillary vein. Mechanical thrombectomy was performed then with the penumbra CAT 7D catheter. This resulted in resolution of the thrombus, but a high-grade residual stenosis with extravasation was seen at the venous anastomosis.  I then elected to treat this with an 8 mm diameter by 15 cm length Viabahn stent postdilated with a 7 mm balloon with excellent angiographic completion result and less than 10% residual stenosis.    Based on the completion imaging, no further intervention is necessary.  The wire and balloon were removed from the sheath.  A 4-0 Monocryl purse-string suture was sewn around the sheath.  The sheath was removed while tying down the suture.  A sterile bandage was applied to the puncture site.  COMPLICATIONS: None  CONDITION: Stable   Leotis Pain 07/31/2019 11:36 AM   This note was created with Dragon Medical transcription system. Any errors in dictation are purely unintentional.

## 2019-07-31 NOTE — Discharge Instructions (Signed)

## 2019-08-05 DIAGNOSIS — I5022 Chronic systolic (congestive) heart failure: Secondary | ICD-10-CM | POA: Diagnosis not present

## 2019-08-07 DIAGNOSIS — Z992 Dependence on renal dialysis: Secondary | ICD-10-CM | POA: Diagnosis not present

## 2019-08-07 DIAGNOSIS — N186 End stage renal disease: Secondary | ICD-10-CM | POA: Diagnosis not present

## 2019-08-09 DIAGNOSIS — N186 End stage renal disease: Secondary | ICD-10-CM | POA: Diagnosis not present

## 2019-08-09 DIAGNOSIS — Z992 Dependence on renal dialysis: Secondary | ICD-10-CM | POA: Diagnosis not present

## 2019-08-11 ENCOUNTER — Telehealth (INDEPENDENT_AMBULATORY_CARE_PROVIDER_SITE_OTHER): Payer: Self-pay

## 2019-08-11 DIAGNOSIS — N186 End stage renal disease: Secondary | ICD-10-CM | POA: Diagnosis not present

## 2019-08-11 DIAGNOSIS — Z992 Dependence on renal dialysis: Secondary | ICD-10-CM | POA: Diagnosis not present

## 2019-08-11 NOTE — Telephone Encounter (Signed)
A fax was received for this patent to have a graft declot. Patient is scheduled with Dr. Lucky Cowboy for a graft declot on 08/17/19 with a 7:45 am arrival time to the [facility]. Patient will do covid testing on 08/16/19 between 8-1 pm at the [place]. Pre-procedure instructions will be faxed to [facility] at [facility].

## 2019-08-14 DIAGNOSIS — N186 End stage renal disease: Secondary | ICD-10-CM | POA: Diagnosis not present

## 2019-08-14 DIAGNOSIS — Z992 Dependence on renal dialysis: Secondary | ICD-10-CM | POA: Diagnosis not present

## 2019-08-15 ENCOUNTER — Encounter: Payer: Self-pay | Admitting: *Deleted

## 2019-08-15 ENCOUNTER — Other Ambulatory Visit: Payer: Self-pay | Admitting: *Deleted

## 2019-08-15 NOTE — Patient Outreach (Signed)
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  With call attempt today, received automated outgoing voice message stating that patient is unavailable and has a voice mail box that has not yet been set up- unable to leave patient voice message requesting call back  Plan:  Will re-attempt [place] telephone outreach within 4 business days if I do not hear back from patient first  Oneta Rack, RN, BSN, [employer] Coordinator [facility] Care Management  [phone]

## 2019-08-16 ENCOUNTER — Other Ambulatory Visit (INDEPENDENT_AMBULATORY_CARE_PROVIDER_SITE_OTHER): Payer: Self-pay | Admitting: Nurse Practitioner

## 2019-08-16 ENCOUNTER — Other Ambulatory Visit: Payer: Self-pay

## 2019-08-16 ENCOUNTER — Other Ambulatory Visit
Admission: RE | Admit: 2019-08-16 | Discharge: 2019-08-16 | Disposition: A | Discharge status: Home / Self Care | Payer: Medicare HMO | Source: Ambulatory Visit | Attending: Vascular Surgery | Admitting: Vascular Surgery

## 2019-08-16 DIAGNOSIS — Z01812 Encounter for preprocedural laboratory examination: Secondary | ICD-10-CM | POA: Diagnosis not present

## 2019-08-16 DIAGNOSIS — N186 End stage renal disease: Secondary | ICD-10-CM | POA: Diagnosis not present

## 2019-08-16 DIAGNOSIS — Z992 Dependence on renal dialysis: Secondary | ICD-10-CM | POA: Diagnosis not present

## 2019-08-16 DIAGNOSIS — Z20822 Contact with and (suspected) exposure to covid-19: Secondary | ICD-10-CM | POA: Insufficient documentation

## 2019-08-16 LAB — SARS CORONAVIRUS 2 (TAT 6-24 HRS): SARS Coronavirus 2: NEGATIVE

## 2019-08-17 ENCOUNTER — Other Ambulatory Visit: Payer: Self-pay

## 2019-08-17 ENCOUNTER — Encounter
Admission: RE | Disposition: A | Discharge status: Home / Self Care | Payer: Self-pay | Source: Home / Self Care | Attending: Vascular Surgery

## 2019-08-17 ENCOUNTER — Ambulatory Visit
Admission: RE | Admit: 2019-08-17 | Discharge: 2019-08-17 | Disposition: A | Discharge status: Home / Self Care | Payer: Medicare HMO | Attending: Vascular Surgery | Admitting: Vascular Surgery

## 2019-08-17 ENCOUNTER — Encounter: Payer: Self-pay | Admitting: Vascular Surgery

## 2019-08-17 DIAGNOSIS — I252 Old myocardial infarction: Secondary | ICD-10-CM | POA: Diagnosis not present

## 2019-08-17 DIAGNOSIS — Z79899 Other long term (current) drug therapy: Secondary | ICD-10-CM | POA: Diagnosis not present

## 2019-08-17 DIAGNOSIS — I2510 Atherosclerotic heart disease of native coronary artery without angina pectoris: Secondary | ICD-10-CM | POA: Insufficient documentation

## 2019-08-17 DIAGNOSIS — G4730 Sleep apnea, unspecified: Secondary | ICD-10-CM | POA: Insufficient documentation

## 2019-08-17 DIAGNOSIS — Z7982 Long term (current) use of aspirin: Secondary | ICD-10-CM | POA: Insufficient documentation

## 2019-08-17 DIAGNOSIS — Z992 Dependence on renal dialysis: Secondary | ICD-10-CM | POA: Diagnosis not present

## 2019-08-17 DIAGNOSIS — Z7902 Long term (current) use of antithrombotics/antiplatelets: Secondary | ICD-10-CM | POA: Insufficient documentation

## 2019-08-17 DIAGNOSIS — Z8673 Personal history of transient ischemic attack (TIA), and cerebral infarction without residual deficits: Secondary | ICD-10-CM | POA: Insufficient documentation

## 2019-08-17 DIAGNOSIS — N186 End stage renal disease: Secondary | ICD-10-CM | POA: Insufficient documentation

## 2019-08-17 DIAGNOSIS — I6523 Occlusion and stenosis of bilateral carotid arteries: Secondary | ICD-10-CM | POA: Diagnosis not present

## 2019-08-17 DIAGNOSIS — E1151 Type 2 diabetes mellitus with diabetic peripheral angiopathy without gangrene: Secondary | ICD-10-CM | POA: Diagnosis not present

## 2019-08-17 DIAGNOSIS — I132 Hypertensive heart and chronic kidney disease with heart failure and with stage 5 chronic kidney disease, or end stage renal disease: Secondary | ICD-10-CM | POA: Diagnosis not present

## 2019-08-17 DIAGNOSIS — E1122 Type 2 diabetes mellitus with diabetic chronic kidney disease: Secondary | ICD-10-CM | POA: Insufficient documentation

## 2019-08-17 DIAGNOSIS — Y841 Kidney dialysis as the cause of abnormal reaction of the patient, or of later complication, without mention of misadventure at the time of the procedure: Secondary | ICD-10-CM | POA: Diagnosis not present

## 2019-08-17 DIAGNOSIS — I5022 Chronic systolic (congestive) heart failure: Secondary | ICD-10-CM | POA: Insufficient documentation

## 2019-08-17 DIAGNOSIS — T82868A Thrombosis of vascular prosthetic devices, implants and grafts, initial encounter: Secondary | ICD-10-CM | POA: Diagnosis not present

## 2019-08-17 HISTORY — PX: PERIPHERAL VASCULAR THROMBECTOMY: CATH118306

## 2019-08-17 LAB — GLUCOSE, CAPILLARY
Glucose-Capillary: 132 mg/dL — ABNORMAL HIGH (ref 70–99)
Glucose-Capillary: 111 mg/dL — ABNORMAL HIGH (ref 70–99)

## 2019-08-17 LAB — POTASSIUM (ARMC VASCULAR LAB ONLY): Potassium (ARMC vascular lab): 5.2 — ABNORMAL HIGH (ref 3.5–5.1)

## 2019-08-17 SURGERY — PERIPHERAL VASCULAR THROMBECTOMY
Anesthesia: Moderate Sedation | Laterality: Right

## 2019-08-17 MED ORDER — SODIUM CHLORIDE 0.9 % IV SOLN: INTRAVENOUS | Status: DC

## 2019-08-17 MED ORDER — METHYLPREDNISOLONE SODIUM SUCC 125 MG IJ SOLR: 125.0000 mg | Freq: Once | INTRAMUSCULAR | Status: DC | PRN

## 2019-08-17 MED ORDER — IODIXANOL 320 MG/ML IV SOLN
INTRAVENOUS | Status: DC | PRN
  Administered 2019-08-17: 40 mL

## 2019-08-17 MED ORDER — MIDAZOLAM HCL 5 MG/5ML IJ SOLN
INTRAMUSCULAR | Status: AC
  Filled 2019-08-17: qty 5

## 2019-08-17 MED ORDER — HEPARIN SODIUM (PORCINE) 1000 UNIT/ML IJ SOLN
INTRAMUSCULAR | Status: DC | PRN
  Administered 2019-08-17: 4000 [IU] via INTRAVENOUS

## 2019-08-17 MED ORDER — FENTANYL CITRATE (PF) 100 MCG/2ML IJ SOLN
INTRAMUSCULAR | Status: DC | PRN
  Administered 2019-08-17: 50 ug via INTRAVENOUS
  Administered 2019-08-17: 25 ug via INTRAVENOUS

## 2019-08-17 MED ORDER — ALTEPLASE 2 MG IJ SOLR
INTRAMUSCULAR | Status: AC
  Filled 2019-08-17: qty 4

## 2019-08-17 MED ORDER — ALTEPLASE 2 MG IJ SOLR
INTRAMUSCULAR | Status: DC | PRN
  Administered 2019-08-17: 4 mg

## 2019-08-17 MED ORDER — HEPARIN SODIUM (PORCINE) 1000 UNIT/ML IJ SOLN
INTRAMUSCULAR | Status: AC
  Filled 2019-08-17: qty 1

## 2019-08-17 MED ORDER — DIPHENHYDRAMINE HCL 50 MG/ML IJ SOLN: 50.0000 mg | Freq: Once | INTRAMUSCULAR | Status: DC | PRN

## 2019-08-17 MED ORDER — MIDAZOLAM HCL 2 MG/ML PO SYRP: 8.0000 mg | ORAL_SOLUTION | Freq: Once | ORAL | Status: DC | PRN

## 2019-08-17 MED ORDER — CEFAZOLIN SODIUM-DEXTROSE 1-4 GM/50ML-% IV SOLN
INTRAVENOUS | Status: AC
  Filled 2019-08-17: qty 50

## 2019-08-17 MED ORDER — HYDROMORPHONE HCL 1 MG/ML IJ SOLN: 1.0000 mg | Freq: Once | INTRAMUSCULAR | Status: DC | PRN

## 2019-08-17 MED ORDER — ONDANSETRON HCL 4 MG/2ML IJ SOLN: 4.0000 mg | Freq: Four times a day (QID) | INTRAMUSCULAR | Status: DC | PRN

## 2019-08-17 MED ORDER — MIDAZOLAM HCL 2 MG/2ML IJ SOLN
INTRAMUSCULAR | Status: DC | PRN
  Administered 2019-08-17: 1 mg via INTRAVENOUS
  Administered 2019-08-17: 2 mg via INTRAVENOUS

## 2019-08-17 MED ORDER — FAMOTIDINE 20 MG PO TABS: 40.0000 mg | ORAL_TABLET | Freq: Once | ORAL | Status: DC | PRN

## 2019-08-17 MED ORDER — CEFAZOLIN SODIUM-DEXTROSE 1-4 GM/50ML-% IV SOLN
1.0000 g | Freq: Once | INTRAVENOUS | Status: AC
  Administered 2019-08-17: 09:00:00 1 g via INTRAVENOUS

## 2019-08-17 MED ORDER — FENTANYL CITRATE (PF) 100 MCG/2ML IJ SOLN
INTRAMUSCULAR | Status: AC
  Filled 2019-08-17: qty 2

## 2019-08-17 SURGICAL SUPPLY — 13 items
BALLN DORADO7X100X80 (BALLOONS) ×3
BALLOON DORADO7X100X80 (BALLOONS) IMPLANT
CANISTER PENUMBRA ENGINE (MISCELLANEOUS) ×2 IMPLANT
CANNULA 5F STIFF (CANNULA) ×2 IMPLANT
CATH EMBOLECTOMY 5FR (BALLOONS) ×2 IMPLANT
CATH INDIGO 7D KIT (CATHETERS) ×2 IMPLANT
DEVICE PRESTO INFLATION (MISCELLANEOUS) ×2 IMPLANT
DRAPE BRACHIAL (DRAPES) ×2 IMPLANT
PACK ANGIOGRAPHY (CUSTOM PROCEDURE TRAY) ×3 IMPLANT
SHEATH BRITE TIP 6FRX5.5 (SHEATH) ×4 IMPLANT
SHEATH BRITE TIP 7FRX5.5 (SHEATH) ×2 IMPLANT
SUT MNCRL AB 4-0 PS2 18 (SUTURE) ×2 IMPLANT
WIRE MAGIC TOR.035 180C (WIRE) ×2 IMPLANT

## 2019-08-17 NOTE — Discharge Instructions (Signed)
Dialysis Shuntogram, Care After This sheet gives you information about how to care for yourself after your procedure. Your health care provider may also give you more specific instructions. If you have problems or questions, contact your health care provider. What can I expect after the procedure? After the procedure, it is common to have:  A small amount of discomfort in the area where the small, thin tube (catheter) was placed for the procedure.  A small amount of bruising around the fistula.  Sleepiness and tiredness (fatigue). Follow these instructions at home: Activity   Rest at home and do not lift anything that is heavier than 5 lb (2.3 kg) on the day after your procedure.  Return to your normal activities as told by your health care provider. Ask your health care provider what activities are safe for you.  Do not drive or use heavy machinery while taking prescription pain medicine.  Do not drive for 24 hours if you were given a medicine to help you relax (sedative) during your procedure. Medicines   Take over-the-counter and prescription medicines only as told by your health care provider. Puncture site care  Follow instructions from your health care provider about how to take care of the site where catheters were inserted. Make sure you: ? Wash your hands with soap and water before you change your bandage (dressing). If soap and water are not available, use hand sanitizer. ? Change your dressing as told by your health care provider. ? Leave stitches (sutures), skin glue, or adhesive strips in place. These skin closures may need to stay in place for 2 weeks or longer. If adhesive strip edges start to loosen and curl up, you may trim the loose edges. Do not remove adhesive strips completely unless your health care provider tells you to do that.  Check your puncture area every day for signs of infection. Check for: ? Redness, swelling, or pain. ? Fluid or  blood. ? Warmth. ? Pus or a bad smell. General instructions  Do not take baths, swim, or use a hot tub until your health care provider approves. Ask your health care provider if you may take showers. You may only be allowed to take sponge baths.  Monitor your dialysis fistula closely. Check to make sure that you can feel a vibration or buzz (a thrill) when you put your fingers over the fistula.  Prevent damage to your graft or fistula: ? Do not wear tight-fitting clothing or jewelry on the arm or leg that has your graft or fistula. ? Tell all your health care providers that you have a dialysis fistula or graft. ? Do not allow blood draws, IVs, or blood pressure readings to be done in the arm that has your fistula or graft. ? Do not allow flu shots or vaccinations in the arm with your fistula or graft.  Keep all follow-up visits as told by your health care provider. This is important. Contact a health care provider if:  You have redness, swelling, or pain at the site where the catheter was put in.  You have fluid or blood coming from the catheter site.  The catheter site feels warm to the touch.  You have pus or a bad smell coming from the catheter site.  You have a fever or chills. Get help right away if:  You feel weak.  You have trouble balancing.  You have trouble moving your arms or legs.  You have problems with your speech or vision.  You  can no longer feel a vibration or buzz when you put your fingers over your dialysis fistula.  The limb that was used for the procedure: ? Swells. ? Is painful. ? Is cold. ? Is discolored, such as blue or pale white.  You have chest pain or shortness of breath. Summary  After a dialysis fistulogram, it is common to have a small amount of discomfort or bruising in the area where the small, thin tube (catheter) was placed.  Rest at home on the day after your procedure. Return to your normal activities as told by your health care  provider.  Take over-the-counter and prescription medicines only as told by your health care provider.  Follow instructions from your health care provider about how to take care of the site where the catheter was inserted.  Keep all follow-up visits as told by your health care provider. This information is not intended to replace advice given to you by your health care provider. Make sure you discuss any questions you have with your health care provider. Document Revised: 05/21/2017 Document Reviewed: 05/21/2017 Elsevier Patient Education  [place].   Moderate Conscious Sedation, Adult, Care After These instructions provide you with information about caring for yourself after your procedure. Your health care provider may also give you more specific instructions. Your treatment has been planned according to current medical practices, but problems sometimes occur. Call your health care provider if you have any problems or questions after your procedure. What can I expect after the procedure? After your procedure, it is common:  To feel sleepy for several hours.  To feel clumsy and have poor balance for several hours.  To have poor judgment for several hours.  To vomit if you eat too soon. Follow these instructions at home: For at least 24 hours after the procedure:   Do not: ? Participate in activities where you could fall or become injured. ? Drive. ? Use heavy machinery. ? Drink alcohol. ? Take sleeping pills or medicines that cause drowsiness. ? Make important decisions or sign legal documents. ? Take care of children on your own.  Rest. Eating and drinking  Follow the diet recommended by your health care provider.  If you vomit: ? Drink water, juice, or soup when you can drink without vomiting. ? Make sure you have little or no nausea before eating solid foods. General instructions  Have a responsible adult stay with you until you are awake and  alert.  Take over-the-counter and prescription medicines only as told by your health care provider.  If you smoke, do not smoke without supervision.  Keep all follow-up visits as told by your health care provider. This is important. Contact a health care provider if:  You keep feeling nauseous or you keep vomiting.  You feel light-headed.  You develop a rash.  You have a fever. Get help right away if:  You have trouble breathing. This information is not intended to replace advice given to you by your health care provider. Make sure you discuss any questions you have with your health care provider. Document Revised: 04/02/2017 Document Reviewed: 08/10/2015 Elsevier Patient Education  2020 [employer].

## 2019-08-17 NOTE — Op Note (Signed)
[place] [facility]    OPERATIVE NOTE   PROCEDURE: 1.  Right brachial artery to axillary vein arteriovenous graft cannulation under ultrasound guidance in both a retrograde and then antegrade fashion crossing 2.  Right arm shuntogram and central venogram 3.  Catheter directed thrombolysis with 4 mg of TPA  4.  Mechanical thrombectomy to the right brachial artery to axillary vein AV graft with the penumbra CAT 7D catheter 5.  Fogarty embolectomy for residual arterial plug 6.  Percutaneous transluminal angioplasty of mid and distal graft and axillary vein with 7 mm diameter angioplasty balloon  PRE-OPERATIVE DIAGNOSIS: 1. ESRD 2.  Thrombosed right brachial artery to axillary vein arteriovenous graft  POST-OPERATIVE DIAGNOSIS: same as above   SURGEON: Leotis Pain, MD  ANESTHESIA: local with Moderate Conscious Sedation for approximately 35 minutes using 3 mg of Versed and 75 mcg of Fentanyl  ESTIMATED BLOOD LOSS: 200 cc  FINDING(S): 1. Thrombosed graft  SPECIMEN(S):  None  CONTRAST: 40 cc  FLUORO TIME: 6.8 minutes  INDICATIONS: Patient is a 54 y.o.male who presents with a thrombosed right brachial artery to axillary vein arteriovenous graft.  The patient is scheduled for an attempted declot and shuntogram.  The patient is aware the risks include but are not limited to: bleeding, infection, thrombosis of the cannulated access, and possible anaphylactic reaction to the contrast.  The patient is aware of the risks of the procedure and elects to proceed forward.  DESCRIPTION: After full informed written consent was obtained, the patient was brought back to the angiography suite and placed supine upon the angiography table.  The patient was connected to monitoring equipment. Moderate conscious sedation was administered with a face to face encounter with the patient throughout the procedure with my supervision of the RN administering medicines and monitoring the patient's  vital signs, pulse oximetry, telemetry and mental status throughout from the start of the procedure until the patient was taken to the recovery room. The right arm was prepped and draped in the standard fashion for a percutaneous access intervention.  Under ultrasound guidance, the right arteriovenous graft was cannulated with a micropuncture needle under direct ultrasound guidance due to the pulseless nature of the graft twice, in both an antegrade and a retrograde fashion crossing, and permanent images were performed.  The microwire was advanced and the needle was exchanged for the a microsheath.  I then upsized to a 6 Fr Sheath for both the antegrade and the retrograde sheath and imaging was performed.  Hand injections were completed to image the access including the central venous system. This demonstrated no flow within the AV graft.  Based on the images, this patient will need extensive treatment to salvage the graft. I then gave the patient 4000 units of intravenous heparin.  I then placed a Magic torque wire into the brachial artery from the retrograde sheath and into the axillary vein from the antegrade sheath. 4 mg of TPA were deployed. This was allowed to dwell.   A residual arterial plug was also seen at the arterial anastomosis. An attempt to clear the arterial plug was done with two passes of the Fogarty embolectomy balloon.  This resulted in resolution of the arterial plug, and clearance of the arterial side of the graft. The arterial outflow was seen to be intact as well on these images. The retrograde sheath was removed. I then turned my attention to the thrombus in the distal graft and the axillary vein. Mechanical thrombectomy was performed with the penumbra  CAT 7D device throughout the graft and into the axillary vein.  Multiple passes were made with large chunks of thrombus removed. This resulted in significant improvement but there remained thrombus in the mid to distal graft as well as the  axillary vein.  I then elected to treat this with 2 inflations with a 7 mm diameter by 10 cm length high-pressure angioplasty balloon from the mid graft to near the venous anastomosis initially, and then across the venous anastomosis in the axillary vein distal to the previous stent.  Following this, there was a small amount residual thrombus on the vein valve leaflet on the axillary vein.  I then made another pass with the penumbra CAT 7 device and then used this to image the central venous circulation.  The thrombus was removed.  He did have a PermCath as well as an AICD in the superior vena cava and the flow was somewhat sluggish but no obvious stenosis or thrombus was seen.    Based on the completion imaging, no further intervention is necessary.  The wire and balloon were removed from the sheath.  A 4-0 Monocryl purse-string suture was sewn around the sheath.  The sheath was removed while tying down the suture.  A sterile bandage was applied to the puncture site.  COMPLICATIONS: None  CONDITION: Stable   Leotis Pain 08/17/2019 10:16 AM   This note was created with Dragon Medical transcription system. Any errors in dictation are purely unintentional.

## 2019-08-17 NOTE — H&P (Signed)
[facility] History & Physical Update  The patient was interviewed and re-examined.  The patient's previous History and Physical has been reviewed and is unchanged.  There is no change in the plan of care. We plan to proceed with the scheduled procedure.  Leotis Pain, MD  08/17/2019, 8:53 AM

## 2019-08-18 ENCOUNTER — Other Ambulatory Visit: Payer: Self-pay | Admitting: *Deleted

## 2019-08-18 ENCOUNTER — Encounter: Payer: Self-pay | Admitting: Cardiology

## 2019-08-18 DIAGNOSIS — N186 End stage renal disease: Secondary | ICD-10-CM | POA: Diagnosis not present

## 2019-08-18 DIAGNOSIS — Z992 Dependence on renal dialysis: Secondary | ICD-10-CM | POA: Diagnosis not present

## 2019-08-18 NOTE — Patient Outreach (Signed)
[facility]) Care Management [place] Telephone Outreach Unsuccessful (consecutive) outreach attempt # 2- previously engaged patient  08/18/2019  Demarrius Guerrero. 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  With call attempt today, I again received automated outgoing message, stating that patient has a voice mail box that has not yet been set up- unable to leave patient voice message requesting call back  Plan:  Will place [facility] CM unsuccessful patient outreach letter in mail requesting call back in writing, as this is the second consecutive outreach attempt without patient call back  Will re-attempt [place] telephone outreach within 4 business days if I do not hear back from patient first  Oneta Rack, RN, BSN, [employer] [facility] Care Management  [phone]

## 2019-08-21 DIAGNOSIS — N186 End stage renal disease: Secondary | ICD-10-CM | POA: Diagnosis not present

## 2019-08-21 DIAGNOSIS — Z992 Dependence on renal dialysis: Secondary | ICD-10-CM | POA: Diagnosis not present

## 2019-08-22 ENCOUNTER — Other Ambulatory Visit: Payer: Self-pay | Admitting: *Deleted

## 2019-08-22 ENCOUNTER — Encounter: Payer: Self-pay | Admitting: *Deleted

## 2019-08-22 NOTE — Patient Outreach (Signed)
[facility]) Care Management [place] Telephone Outreach- Transfer of care to [place]  08/22/2019  Paul [name]. 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  HIPAA/ identity verified with patient; patient reports "things finally settled down and going much better;" he denies clinical concerns/ issues/ problems; reports he has not missed any provider appointments or hemodialysis sessions recently.  Denies pain, new/ recent falls, and patient sounds to be in no distress throughout phone call today.  Patient further reports:  -- no further issues with transportation as previously reported: states he now has 2 ramps installed at his residence and is able to use his Hover-Round mobility device to get to street to be picked up by [employer] bus; confirms that his previously reported issues with his hover-round mobility unit is now resolved- states "they fixed it and it's working fine."  -- has not missed any scheduled provider office visits, and has not missed any hemodialysis sessions; hemodialysis sessions continue on M/ W/ F  -- no concerns around medications: continues to self-manage; denies recent changes to medications  -- home health services for PT ([facility]) remain active for rehabilitation in acclimating to new prosthesis- reports home health PT sessions going well, visits now once weekly; continues trying to get used to using prosthesis for ambulation; admits challenges with balance; states home health PT "helps;" patient encouraged to continue active participation in home  health PT services  -- denies ongoing community resource needs: reports "everything is finally going smoothly and everything is in place"   Patient denies further issues, concerns, or problems today.  Confirmed with patient that he now has [employer], and discussed transfer of his [facility] CM case to [place] dedicated to [employer]; patient is agreeable to this; encouraged patient to fully engage with [place] CM, and to expect outreach call next month.  I confirmed that patient hasmy direct phone number, the main [place] office phone number, and the [facility] CM 24-hour nurse advice phone number should issues arise prior to [place] outreach. Encouraged patient to contact me directly if needs, questions, issues, or concerns arise prior to outreach by [facility] RN CM; patient agreed to do so.  Plan:  Patient will take medications as prescribed and will attend all scheduled provider appointmentsand hemodialysis sesisons  Patient will promptly notify care providers for any new concerns/ issues/ problems that arise  Patient will continue to actively participate in home health PT services for ongoing mobility training with prosthesis  Patient will continue using assistive devices for fall prevention  I will share today's [facility] CM notes/ care plan with patient's PCP as [facility] CM quarterly update; will make PCP aware that patient will be followed by [facility] RN CM dedicated to [facility] plan  Will place referral for [facility] RN CM dedicated to [facility] to assume patient's ongoing care management/ coordination needs  [facility] CM Care Plan Problem One     Most Recent Value  Care Plan Problem One Ongoing reinforcement of self-health management of multiple chronic conditons in patient with ESRD, relatively new to hemodialysis and recent (R) BKA, as evidenced by patient  reporting  Role Documenting the Problem One  Care Management Coordinator  Care Plan for Problem One  Active  [facility]  Long Term Goal   Over the next 60 days, patient will continue in progression of using prosthesis for (R) BKA, as evidenced by patient reporting during [facility] RN CM outreach  [place] Term Goal Start Date  08/22/19  Interventions for Problem One Long Term Goal  Confirmed that patient continues working with home health PT around using prosthesis for ambulation,  discussed with patient his progress/ challenges with balance,  provided positive reinforcement for patient's ongoing participation with home health PT, and encouraged his ongoing engagement/ participation  [facility] CM Short Term Goal #1   Over the next 30 days, patient will attend all scheduled hemodialysis sessins and provider appointments, as evidenced by patient reporting and collaboration as indicated with care providers during [facility] RN CM outreach  [facility] CM Short Term Goal #1 Start Date  07/18/19  [facility] CM Short Term Goal #1 Met Date  08/22/19 [Goal met]  Interventions for Short Term Goal #1  Confirmed with patient that he has attended all recent provider appointments and hemodialysis sessions,  confirmed that he no longer has concerns around transportation,  confirmed that he has not missed any recent hemodialysis appointments  [facility] CM Short Term Goal #2   Over the next 30 days, patient will continue to actively participate in home health PT services for rehabilitaion and education in proper use of new prosthesis, as evidenced by patient reporting and collaboration with home health team as indicated during [facility] RN CM outreach  [facility] CM Short Term Goal #2 Start Date  07/18/19  [facility] CM Short Term Goal #2 Met Date  08/22/19 [Goal Met]  Interventions for Short Term Goal #2  Confirmed that patient has continued to work with home health PT for mobility and prosthesis ambulation training,  encouraged his ongoing participation,  expanded goal to long term goal and discussed with patient transfer to [place] dedicated to [employer],  placed care coordination  referral to [place] RN CM and encouraged patient's ongoing participation/ engagement with [facility] RN CM services     It has been a pleasure caring for Camillia Herter, RN, BSN, [employer] Coordinator [facility] Care Management  [phone]

## 2019-08-23 DIAGNOSIS — Z992 Dependence on renal dialysis: Secondary | ICD-10-CM | POA: Diagnosis not present

## 2019-08-23 DIAGNOSIS — N186 End stage renal disease: Secondary | ICD-10-CM | POA: Diagnosis not present

## 2019-08-24 DIAGNOSIS — N186 End stage renal disease: Secondary | ICD-10-CM | POA: Diagnosis not present

## 2019-08-24 DIAGNOSIS — E1122 Type 2 diabetes mellitus with diabetic chronic kidney disease: Secondary | ICD-10-CM | POA: Diagnosis not present

## 2019-08-24 DIAGNOSIS — E1151 Type 2 diabetes mellitus with diabetic peripheral angiopathy without gangrene: Secondary | ICD-10-CM | POA: Diagnosis not present

## 2019-08-24 DIAGNOSIS — I132 Hypertensive heart and chronic kidney disease with heart failure and with stage 5 chronic kidney disease, or end stage renal disease: Secondary | ICD-10-CM | POA: Diagnosis not present

## 2019-08-24 DIAGNOSIS — I2510 Atherosclerotic heart disease of native coronary artery without angina pectoris: Secondary | ICD-10-CM | POA: Diagnosis not present

## 2019-08-24 DIAGNOSIS — Z4781 Encounter for orthopedic aftercare following surgical amputation: Secondary | ICD-10-CM | POA: Diagnosis not present

## 2019-08-24 DIAGNOSIS — Z89511 Acquired absence of right leg below knee: Secondary | ICD-10-CM | POA: Diagnosis not present

## 2019-08-24 DIAGNOSIS — I255 Ischemic cardiomyopathy: Secondary | ICD-10-CM | POA: Diagnosis not present

## 2019-08-24 DIAGNOSIS — I5020 Unspecified systolic (congestive) heart failure: Secondary | ICD-10-CM | POA: Diagnosis not present

## 2019-08-25 DIAGNOSIS — N186 End stage renal disease: Secondary | ICD-10-CM | POA: Diagnosis not present

## 2019-08-25 DIAGNOSIS — Z992 Dependence on renal dialysis: Secondary | ICD-10-CM | POA: Diagnosis not present

## 2019-08-28 ENCOUNTER — Telehealth (INDEPENDENT_AMBULATORY_CARE_PROVIDER_SITE_OTHER): Payer: Self-pay

## 2019-08-28 DIAGNOSIS — N186 End stage renal disease: Secondary | ICD-10-CM | POA: Diagnosis not present

## 2019-08-28 DIAGNOSIS — Z992 Dependence on renal dialysis: Secondary | ICD-10-CM | POA: Diagnosis not present

## 2019-08-28 NOTE — Telephone Encounter (Signed)
Spoke with Elta Guadeloupe at [facility] and the patient is scheduled for a right arm declot with Dr. Lucky Cowboy on 08/31/19 with a 12:30 pm arrival time to the [facility]. Patient will do covid testing on 08/30/19 between 8-1 pm at the [place]. Pre-procedure instruction will be faxed to [facility] at [facility].

## 2019-08-29 DIAGNOSIS — I5020 Unspecified systolic (congestive) heart failure: Secondary | ICD-10-CM | POA: Diagnosis not present

## 2019-08-29 DIAGNOSIS — I132 Hypertensive heart and chronic kidney disease with heart failure and with stage 5 chronic kidney disease, or end stage renal disease: Secondary | ICD-10-CM | POA: Diagnosis not present

## 2019-08-29 DIAGNOSIS — Z4781 Encounter for orthopedic aftercare following surgical amputation: Secondary | ICD-10-CM | POA: Diagnosis not present

## 2019-08-29 DIAGNOSIS — I255 Ischemic cardiomyopathy: Secondary | ICD-10-CM | POA: Diagnosis not present

## 2019-08-29 DIAGNOSIS — N186 End stage renal disease: Secondary | ICD-10-CM | POA: Diagnosis not present

## 2019-08-29 DIAGNOSIS — Z89511 Acquired absence of right leg below knee: Secondary | ICD-10-CM | POA: Diagnosis not present

## 2019-08-29 DIAGNOSIS — E1122 Type 2 diabetes mellitus with diabetic chronic kidney disease: Secondary | ICD-10-CM | POA: Diagnosis not present

## 2019-08-29 DIAGNOSIS — I2510 Atherosclerotic heart disease of native coronary artery without angina pectoris: Secondary | ICD-10-CM | POA: Diagnosis not present

## 2019-08-29 DIAGNOSIS — E1151 Type 2 diabetes mellitus with diabetic peripheral angiopathy without gangrene: Secondary | ICD-10-CM | POA: Diagnosis not present

## 2019-08-30 ENCOUNTER — Other Ambulatory Visit
Admission: RE | Admit: 2019-08-30 | Discharge: 2019-08-30 | Disposition: A | Discharge status: Home / Self Care | Payer: Medicare HMO | Source: Ambulatory Visit | Attending: Vascular Surgery | Admitting: Vascular Surgery

## 2019-08-30 ENCOUNTER — Other Ambulatory Visit: Payer: Self-pay

## 2019-08-30 ENCOUNTER — Telehealth (INDEPENDENT_AMBULATORY_CARE_PROVIDER_SITE_OTHER): Payer: Self-pay

## 2019-08-30 ENCOUNTER — Other Ambulatory Visit (INDEPENDENT_AMBULATORY_CARE_PROVIDER_SITE_OTHER): Payer: Self-pay | Admitting: Nurse Practitioner

## 2019-08-30 DIAGNOSIS — Z4781 Encounter for orthopedic aftercare following surgical amputation: Secondary | ICD-10-CM | POA: Diagnosis not present

## 2019-08-30 DIAGNOSIS — E1151 Type 2 diabetes mellitus with diabetic peripheral angiopathy without gangrene: Secondary | ICD-10-CM | POA: Diagnosis not present

## 2019-08-30 DIAGNOSIS — I5042 Chronic combined systolic (congestive) and diastolic (congestive) heart failure: Secondary | ICD-10-CM | POA: Diagnosis not present

## 2019-08-30 DIAGNOSIS — I679 Cerebrovascular disease, unspecified: Secondary | ICD-10-CM | POA: Diagnosis not present

## 2019-08-30 DIAGNOSIS — I255 Ischemic cardiomyopathy: Secondary | ICD-10-CM | POA: Diagnosis not present

## 2019-08-30 DIAGNOSIS — S88111A Complete traumatic amputation at level between knee and ankle, right lower leg, initial encounter: Secondary | ICD-10-CM | POA: Diagnosis not present

## 2019-08-30 DIAGNOSIS — Z20822 Contact with and (suspected) exposure to covid-19: Secondary | ICD-10-CM | POA: Insufficient documentation

## 2019-08-30 DIAGNOSIS — Z89511 Acquired absence of right leg below knee: Secondary | ICD-10-CM | POA: Diagnosis not present

## 2019-08-30 DIAGNOSIS — Z992 Dependence on renal dialysis: Secondary | ICD-10-CM | POA: Diagnosis not present

## 2019-08-30 DIAGNOSIS — I5020 Unspecified systolic (congestive) heart failure: Secondary | ICD-10-CM | POA: Diagnosis not present

## 2019-08-30 DIAGNOSIS — E1122 Type 2 diabetes mellitus with diabetic chronic kidney disease: Secondary | ICD-10-CM | POA: Diagnosis not present

## 2019-08-30 DIAGNOSIS — I132 Hypertensive heart and chronic kidney disease with heart failure and with stage 5 chronic kidney disease, or end stage renal disease: Secondary | ICD-10-CM | POA: Diagnosis not present

## 2019-08-30 DIAGNOSIS — N186 End stage renal disease: Secondary | ICD-10-CM | POA: Diagnosis not present

## 2019-08-30 DIAGNOSIS — I2510 Atherosclerotic heart disease of native coronary artery without angina pectoris: Secondary | ICD-10-CM | POA: Diagnosis not present

## 2019-08-30 DIAGNOSIS — M5136 Other intervertebral disc degeneration, lumbar region: Secondary | ICD-10-CM | POA: Diagnosis not present

## 2019-08-30 DIAGNOSIS — R600 Localized edema: Secondary | ICD-10-CM | POA: Diagnosis not present

## 2019-08-30 DIAGNOSIS — Z01812 Encounter for preprocedural laboratory examination: Secondary | ICD-10-CM | POA: Insufficient documentation

## 2019-08-30 DIAGNOSIS — N184 Chronic kidney disease, stage 4 (severe): Secondary | ICD-10-CM | POA: Diagnosis not present

## 2019-08-30 LAB — SARS CORONAVIRUS 2 (TAT 6-24 HRS): SARS Coronavirus 2: NEGATIVE

## 2019-08-31 ENCOUNTER — Telehealth (INDEPENDENT_AMBULATORY_CARE_PROVIDER_SITE_OTHER): Payer: Self-pay

## 2019-08-31 ENCOUNTER — Ambulatory Visit
Admission: RE | Admit: 2019-08-31 | Discharge: 2019-08-31 | Disposition: A | Discharge status: Home / Self Care | Payer: Medicare HMO | Attending: Vascular Surgery | Admitting: Vascular Surgery

## 2019-08-31 DIAGNOSIS — N186 End stage renal disease: Secondary | ICD-10-CM

## 2019-08-31 NOTE — Telephone Encounter (Signed)
I informed Janett Billow to use calcium alginate with foam dressing three times a week on patient wounds per Eulogio Ditch NP.

## 2019-08-31 NOTE — Progress Notes (Signed)
Patient here for procedure.  Notified patient of 2 1/2 hr  procedure delay due to urgent patient added onto schedule this am.  Patient wishes to reschedule procedure for another day.  Devona Konig, CMA contacted to reschedule.  Dr. Lucky Cowboy notified of the above information.  Perm catheter access currently working fine per patient.

## 2019-08-31 NOTE — Telephone Encounter (Signed)
That is fine 

## 2019-08-31 NOTE — Telephone Encounter (Signed)
Spoke with the patient and he has been rescheduled for a RUE graft declot on 09/14/19 with a 7:30 am arrival time to the [facility]. Patient will do covid testing on 09/13/19 between 8-1 pm a the [facility]. Pre-procedure instructions were discussed and will be mailed to the patient.

## 2019-09-01 DIAGNOSIS — N186 End stage renal disease: Secondary | ICD-10-CM | POA: Diagnosis not present

## 2019-09-01 DIAGNOSIS — Z992 Dependence on renal dialysis: Secondary | ICD-10-CM | POA: Diagnosis not present

## 2019-09-04 DIAGNOSIS — Z89511 Acquired absence of right leg below knee: Secondary | ICD-10-CM | POA: Diagnosis not present

## 2019-09-04 DIAGNOSIS — E1151 Type 2 diabetes mellitus with diabetic peripheral angiopathy without gangrene: Secondary | ICD-10-CM | POA: Diagnosis not present

## 2019-09-04 DIAGNOSIS — N186 End stage renal disease: Secondary | ICD-10-CM | POA: Diagnosis not present

## 2019-09-04 DIAGNOSIS — Z992 Dependence on renal dialysis: Secondary | ICD-10-CM | POA: Diagnosis not present

## 2019-09-04 DIAGNOSIS — I132 Hypertensive heart and chronic kidney disease with heart failure and with stage 5 chronic kidney disease, or end stage renal disease: Secondary | ICD-10-CM | POA: Diagnosis not present

## 2019-09-04 DIAGNOSIS — I5020 Unspecified systolic (congestive) heart failure: Secondary | ICD-10-CM | POA: Diagnosis not present

## 2019-09-04 DIAGNOSIS — E1122 Type 2 diabetes mellitus with diabetic chronic kidney disease: Secondary | ICD-10-CM | POA: Diagnosis not present

## 2019-09-04 DIAGNOSIS — I255 Ischemic cardiomyopathy: Secondary | ICD-10-CM | POA: Diagnosis not present

## 2019-09-04 DIAGNOSIS — Z4781 Encounter for orthopedic aftercare following surgical amputation: Secondary | ICD-10-CM | POA: Diagnosis not present

## 2019-09-04 DIAGNOSIS — I2510 Atherosclerotic heart disease of native coronary artery without angina pectoris: Secondary | ICD-10-CM | POA: Diagnosis not present

## 2019-09-04 DIAGNOSIS — I5022 Chronic systolic (congestive) heart failure: Secondary | ICD-10-CM | POA: Diagnosis not present

## 2019-09-04 NOTE — Telephone Encounter (Signed)
Patient called wanting to know what time he arrives to the [facility] for his procedure on 09/14/19. Patient was given the information that his arrival time is 7:30 am to the [facility] and his covid testing is on 09/13/19 before 1:00 pm at the [place].

## 2019-09-05 DIAGNOSIS — I5020 Unspecified systolic (congestive) heart failure: Secondary | ICD-10-CM | POA: Diagnosis not present

## 2019-09-05 DIAGNOSIS — I132 Hypertensive heart and chronic kidney disease with heart failure and with stage 5 chronic kidney disease, or end stage renal disease: Secondary | ICD-10-CM | POA: Diagnosis not present

## 2019-09-05 DIAGNOSIS — E1122 Type 2 diabetes mellitus with diabetic chronic kidney disease: Secondary | ICD-10-CM | POA: Diagnosis not present

## 2019-09-05 DIAGNOSIS — E1151 Type 2 diabetes mellitus with diabetic peripheral angiopathy without gangrene: Secondary | ICD-10-CM | POA: Diagnosis not present

## 2019-09-05 DIAGNOSIS — I2510 Atherosclerotic heart disease of native coronary artery without angina pectoris: Secondary | ICD-10-CM | POA: Diagnosis not present

## 2019-09-05 DIAGNOSIS — Z4781 Encounter for orthopedic aftercare following surgical amputation: Secondary | ICD-10-CM | POA: Diagnosis not present

## 2019-09-05 DIAGNOSIS — Z89511 Acquired absence of right leg below knee: Secondary | ICD-10-CM | POA: Diagnosis not present

## 2019-09-05 DIAGNOSIS — N186 End stage renal disease: Secondary | ICD-10-CM | POA: Diagnosis not present

## 2019-09-05 DIAGNOSIS — I255 Ischemic cardiomyopathy: Secondary | ICD-10-CM | POA: Diagnosis not present

## 2019-09-06 DIAGNOSIS — Z89511 Acquired absence of right leg below knee: Secondary | ICD-10-CM | POA: Diagnosis not present

## 2019-09-06 DIAGNOSIS — E1151 Type 2 diabetes mellitus with diabetic peripheral angiopathy without gangrene: Secondary | ICD-10-CM | POA: Diagnosis not present

## 2019-09-06 DIAGNOSIS — N186 End stage renal disease: Secondary | ICD-10-CM | POA: Diagnosis not present

## 2019-09-06 DIAGNOSIS — Z4781 Encounter for orthopedic aftercare following surgical amputation: Secondary | ICD-10-CM | POA: Diagnosis not present

## 2019-09-06 DIAGNOSIS — I2510 Atherosclerotic heart disease of native coronary artery without angina pectoris: Secondary | ICD-10-CM | POA: Diagnosis not present

## 2019-09-06 DIAGNOSIS — Z992 Dependence on renal dialysis: Secondary | ICD-10-CM | POA: Diagnosis not present

## 2019-09-06 DIAGNOSIS — I5020 Unspecified systolic (congestive) heart failure: Secondary | ICD-10-CM | POA: Diagnosis not present

## 2019-09-06 DIAGNOSIS — E1122 Type 2 diabetes mellitus with diabetic chronic kidney disease: Secondary | ICD-10-CM | POA: Diagnosis not present

## 2019-09-06 DIAGNOSIS — I255 Ischemic cardiomyopathy: Secondary | ICD-10-CM | POA: Diagnosis not present

## 2019-09-06 DIAGNOSIS — I132 Hypertensive heart and chronic kidney disease with heart failure and with stage 5 chronic kidney disease, or end stage renal disease: Secondary | ICD-10-CM | POA: Diagnosis not present

## 2019-09-07 DIAGNOSIS — I132 Hypertensive heart and chronic kidney disease with heart failure and with stage 5 chronic kidney disease, or end stage renal disease: Secondary | ICD-10-CM | POA: Diagnosis not present

## 2019-09-07 DIAGNOSIS — I5020 Unspecified systolic (congestive) heart failure: Secondary | ICD-10-CM | POA: Diagnosis not present

## 2019-09-07 DIAGNOSIS — Z992 Dependence on renal dialysis: Secondary | ICD-10-CM | POA: Diagnosis not present

## 2019-09-07 DIAGNOSIS — Z89511 Acquired absence of right leg below knee: Secondary | ICD-10-CM | POA: Diagnosis not present

## 2019-09-07 DIAGNOSIS — E1122 Type 2 diabetes mellitus with diabetic chronic kidney disease: Secondary | ICD-10-CM | POA: Diagnosis not present

## 2019-09-07 DIAGNOSIS — I2510 Atherosclerotic heart disease of native coronary artery without angina pectoris: Secondary | ICD-10-CM | POA: Diagnosis not present

## 2019-09-07 DIAGNOSIS — E1140 Type 2 diabetes mellitus with diabetic neuropathy, unspecified: Secondary | ICD-10-CM | POA: Diagnosis not present

## 2019-09-07 DIAGNOSIS — N186 End stage renal disease: Secondary | ICD-10-CM | POA: Insufficient documentation

## 2019-09-07 DIAGNOSIS — Z4781 Encounter for orthopedic aftercare following surgical amputation: Secondary | ICD-10-CM | POA: Diagnosis not present

## 2019-09-07 DIAGNOSIS — E1151 Type 2 diabetes mellitus with diabetic peripheral angiopathy without gangrene: Secondary | ICD-10-CM | POA: Diagnosis not present

## 2019-09-07 DIAGNOSIS — Z0000 Encounter for general adult medical examination without abnormal findings: Secondary | ICD-10-CM | POA: Diagnosis not present

## 2019-09-07 DIAGNOSIS — I255 Ischemic cardiomyopathy: Secondary | ICD-10-CM | POA: Diagnosis not present

## 2019-09-07 DIAGNOSIS — I5022 Chronic systolic (congestive) heart failure: Secondary | ICD-10-CM | POA: Diagnosis not present

## 2019-09-07 DIAGNOSIS — I110 Hypertensive heart disease with heart failure: Secondary | ICD-10-CM | POA: Diagnosis not present

## 2019-09-07 DIAGNOSIS — I429 Cardiomyopathy, unspecified: Secondary | ICD-10-CM | POA: Diagnosis not present

## 2019-09-07 DIAGNOSIS — I4892 Unspecified atrial flutter: Secondary | ICD-10-CM | POA: Diagnosis not present

## 2019-09-08 DIAGNOSIS — N186 End stage renal disease: Secondary | ICD-10-CM | POA: Diagnosis not present

## 2019-09-08 DIAGNOSIS — Z992 Dependence on renal dialysis: Secondary | ICD-10-CM | POA: Diagnosis not present

## 2019-09-11 DIAGNOSIS — Z4781 Encounter for orthopedic aftercare following surgical amputation: Secondary | ICD-10-CM | POA: Diagnosis not present

## 2019-09-11 DIAGNOSIS — I2510 Atherosclerotic heart disease of native coronary artery without angina pectoris: Secondary | ICD-10-CM | POA: Diagnosis not present

## 2019-09-11 DIAGNOSIS — Z89511 Acquired absence of right leg below knee: Secondary | ICD-10-CM | POA: Diagnosis not present

## 2019-09-11 DIAGNOSIS — I5020 Unspecified systolic (congestive) heart failure: Secondary | ICD-10-CM | POA: Diagnosis not present

## 2019-09-11 DIAGNOSIS — N186 End stage renal disease: Secondary | ICD-10-CM | POA: Diagnosis not present

## 2019-09-11 DIAGNOSIS — I255 Ischemic cardiomyopathy: Secondary | ICD-10-CM | POA: Diagnosis not present

## 2019-09-11 DIAGNOSIS — E1151 Type 2 diabetes mellitus with diabetic peripheral angiopathy without gangrene: Secondary | ICD-10-CM | POA: Diagnosis not present

## 2019-09-11 DIAGNOSIS — E1122 Type 2 diabetes mellitus with diabetic chronic kidney disease: Secondary | ICD-10-CM | POA: Diagnosis not present

## 2019-09-11 DIAGNOSIS — I132 Hypertensive heart and chronic kidney disease with heart failure and with stage 5 chronic kidney disease, or end stage renal disease: Secondary | ICD-10-CM | POA: Diagnosis not present

## 2019-09-12 ENCOUNTER — Other Ambulatory Visit (INDEPENDENT_AMBULATORY_CARE_PROVIDER_SITE_OTHER): Payer: Self-pay | Admitting: Vascular Surgery

## 2019-09-12 ENCOUNTER — Other Ambulatory Visit: Admission: RE | Admit: 2019-09-12 | Payer: Medicare HMO | Source: Ambulatory Visit

## 2019-09-12 DIAGNOSIS — I5020 Unspecified systolic (congestive) heart failure: Secondary | ICD-10-CM | POA: Diagnosis not present

## 2019-09-12 DIAGNOSIS — Z4781 Encounter for orthopedic aftercare following surgical amputation: Secondary | ICD-10-CM | POA: Diagnosis not present

## 2019-09-12 DIAGNOSIS — E1151 Type 2 diabetes mellitus with diabetic peripheral angiopathy without gangrene: Secondary | ICD-10-CM | POA: Diagnosis not present

## 2019-09-12 DIAGNOSIS — L819 Disorder of pigmentation, unspecified: Secondary | ICD-10-CM

## 2019-09-12 DIAGNOSIS — I132 Hypertensive heart and chronic kidney disease with heart failure and with stage 5 chronic kidney disease, or end stage renal disease: Secondary | ICD-10-CM | POA: Diagnosis not present

## 2019-09-12 DIAGNOSIS — Z95820 Peripheral vascular angioplasty status with implants and grafts: Secondary | ICD-10-CM

## 2019-09-12 DIAGNOSIS — I255 Ischemic cardiomyopathy: Secondary | ICD-10-CM | POA: Diagnosis not present

## 2019-09-12 DIAGNOSIS — T82590S Other mechanical complication of surgically created arteriovenous fistula, sequela: Secondary | ICD-10-CM

## 2019-09-12 DIAGNOSIS — Z89511 Acquired absence of right leg below knee: Secondary | ICD-10-CM | POA: Diagnosis not present

## 2019-09-12 DIAGNOSIS — I2510 Atherosclerotic heart disease of native coronary artery without angina pectoris: Secondary | ICD-10-CM | POA: Diagnosis not present

## 2019-09-12 DIAGNOSIS — E1122 Type 2 diabetes mellitus with diabetic chronic kidney disease: Secondary | ICD-10-CM | POA: Diagnosis not present

## 2019-09-12 DIAGNOSIS — N186 End stage renal disease: Secondary | ICD-10-CM | POA: Diagnosis not present

## 2019-09-13 ENCOUNTER — Other Ambulatory Visit
Admission: RE | Admit: 2019-09-13 | Discharge: 2019-09-13 | Disposition: A | Discharge status: Home / Self Care | Payer: Medicare HMO | Source: Ambulatory Visit | Attending: Vascular Surgery | Admitting: Vascular Surgery

## 2019-09-13 ENCOUNTER — Other Ambulatory Visit (INDEPENDENT_AMBULATORY_CARE_PROVIDER_SITE_OTHER): Payer: Self-pay | Admitting: Nurse Practitioner

## 2019-09-13 DIAGNOSIS — Z20822 Contact with and (suspected) exposure to covid-19: Secondary | ICD-10-CM | POA: Diagnosis not present

## 2019-09-13 DIAGNOSIS — E1122 Type 2 diabetes mellitus with diabetic chronic kidney disease: Secondary | ICD-10-CM | POA: Diagnosis not present

## 2019-09-13 DIAGNOSIS — N186 End stage renal disease: Secondary | ICD-10-CM | POA: Diagnosis not present

## 2019-09-13 DIAGNOSIS — E1151 Type 2 diabetes mellitus with diabetic peripheral angiopathy without gangrene: Secondary | ICD-10-CM | POA: Diagnosis not present

## 2019-09-13 DIAGNOSIS — Z01812 Encounter for preprocedural laboratory examination: Secondary | ICD-10-CM | POA: Insufficient documentation

## 2019-09-13 DIAGNOSIS — I2510 Atherosclerotic heart disease of native coronary artery without angina pectoris: Secondary | ICD-10-CM | POA: Diagnosis not present

## 2019-09-13 DIAGNOSIS — I255 Ischemic cardiomyopathy: Secondary | ICD-10-CM | POA: Diagnosis not present

## 2019-09-13 DIAGNOSIS — I5020 Unspecified systolic (congestive) heart failure: Secondary | ICD-10-CM | POA: Diagnosis not present

## 2019-09-13 DIAGNOSIS — Z992 Dependence on renal dialysis: Secondary | ICD-10-CM | POA: Diagnosis not present

## 2019-09-13 DIAGNOSIS — Z89511 Acquired absence of right leg below knee: Secondary | ICD-10-CM | POA: Diagnosis not present

## 2019-09-13 DIAGNOSIS — Z4781 Encounter for orthopedic aftercare following surgical amputation: Secondary | ICD-10-CM | POA: Diagnosis not present

## 2019-09-13 DIAGNOSIS — I132 Hypertensive heart and chronic kidney disease with heart failure and with stage 5 chronic kidney disease, or end stage renal disease: Secondary | ICD-10-CM | POA: Diagnosis not present

## 2019-09-13 LAB — SARS CORONAVIRUS 2 (TAT 6-24 HRS): SARS Coronavirus 2: NEGATIVE

## 2019-09-14 ENCOUNTER — Encounter (INDEPENDENT_AMBULATORY_CARE_PROVIDER_SITE_OTHER): Payer: Medicare HMO

## 2019-09-14 ENCOUNTER — Encounter
Admission: RE | Disposition: A | Discharge status: Home / Self Care | Payer: Self-pay | Source: Home / Self Care | Attending: Vascular Surgery

## 2019-09-14 ENCOUNTER — Ambulatory Visit
Admission: RE | Admit: 2019-09-14 | Discharge: 2019-09-14 | Disposition: A | Discharge status: Home / Self Care | Payer: Medicare HMO | Attending: Vascular Surgery | Admitting: Vascular Surgery

## 2019-09-14 ENCOUNTER — Other Ambulatory Visit: Payer: Self-pay

## 2019-09-14 ENCOUNTER — Ambulatory Visit (INDEPENDENT_AMBULATORY_CARE_PROVIDER_SITE_OTHER): Payer: Medicare HMO | Admitting: Nurse Practitioner

## 2019-09-14 ENCOUNTER — Encounter: Payer: Self-pay | Admitting: Vascular Surgery

## 2019-09-14 DIAGNOSIS — Z888 Allergy status to other drugs, medicaments and biological substances status: Secondary | ICD-10-CM | POA: Diagnosis not present

## 2019-09-14 DIAGNOSIS — I255 Ischemic cardiomyopathy: Secondary | ICD-10-CM | POA: Insufficient documentation

## 2019-09-14 DIAGNOSIS — Z886 Allergy status to analgesic agent status: Secondary | ICD-10-CM | POA: Insufficient documentation

## 2019-09-14 DIAGNOSIS — I252 Old myocardial infarction: Secondary | ICD-10-CM | POA: Insufficient documentation

## 2019-09-14 DIAGNOSIS — E1122 Type 2 diabetes mellitus with diabetic chronic kidney disease: Secondary | ICD-10-CM | POA: Insufficient documentation

## 2019-09-14 DIAGNOSIS — Z95810 Presence of automatic (implantable) cardiac defibrillator: Secondary | ICD-10-CM | POA: Insufficient documentation

## 2019-09-14 DIAGNOSIS — I132 Hypertensive heart and chronic kidney disease with heart failure and with stage 5 chronic kidney disease, or end stage renal disease: Secondary | ICD-10-CM | POA: Insufficient documentation

## 2019-09-14 DIAGNOSIS — I953 Hypotension of hemodialysis: Secondary | ICD-10-CM | POA: Insufficient documentation

## 2019-09-14 DIAGNOSIS — I2510 Atherosclerotic heart disease of native coronary artery without angina pectoris: Secondary | ICD-10-CM | POA: Diagnosis not present

## 2019-09-14 DIAGNOSIS — Y832 Surgical operation with anastomosis, bypass or graft as the cause of abnormal reaction of the patient, or of later complication, without mention of misadventure at the time of the procedure: Secondary | ICD-10-CM | POA: Diagnosis not present

## 2019-09-14 DIAGNOSIS — E1151 Type 2 diabetes mellitus with diabetic peripheral angiopathy without gangrene: Secondary | ICD-10-CM | POA: Insufficient documentation

## 2019-09-14 DIAGNOSIS — I509 Heart failure, unspecified: Secondary | ICD-10-CM | POA: Insufficient documentation

## 2019-09-14 DIAGNOSIS — Z794 Long term (current) use of insulin: Secondary | ICD-10-CM | POA: Insufficient documentation

## 2019-09-14 DIAGNOSIS — Z89511 Acquired absence of right leg below knee: Secondary | ICD-10-CM | POA: Diagnosis not present

## 2019-09-14 DIAGNOSIS — T82858A Stenosis of vascular prosthetic devices, implants and grafts, initial encounter: Secondary | ICD-10-CM | POA: Diagnosis not present

## 2019-09-14 DIAGNOSIS — Z8249 Family history of ischemic heart disease and other diseases of the circulatory system: Secondary | ICD-10-CM | POA: Diagnosis not present

## 2019-09-14 DIAGNOSIS — Z951 Presence of aortocoronary bypass graft: Secondary | ICD-10-CM | POA: Insufficient documentation

## 2019-09-14 DIAGNOSIS — Z992 Dependence on renal dialysis: Secondary | ICD-10-CM | POA: Insufficient documentation

## 2019-09-14 DIAGNOSIS — Z833 Family history of diabetes mellitus: Secondary | ICD-10-CM | POA: Insufficient documentation

## 2019-09-14 DIAGNOSIS — N186 End stage renal disease: Secondary | ICD-10-CM

## 2019-09-14 DIAGNOSIS — Z4781 Encounter for orthopedic aftercare following surgical amputation: Secondary | ICD-10-CM | POA: Diagnosis not present

## 2019-09-14 DIAGNOSIS — Z95828 Presence of other vascular implants and grafts: Secondary | ICD-10-CM | POA: Diagnosis not present

## 2019-09-14 DIAGNOSIS — G4730 Sleep apnea, unspecified: Secondary | ICD-10-CM | POA: Insufficient documentation

## 2019-09-14 DIAGNOSIS — Z8673 Personal history of transient ischemic attack (TIA), and cerebral infarction without residual deficits: Secondary | ICD-10-CM | POA: Diagnosis not present

## 2019-09-14 DIAGNOSIS — I5020 Unspecified systolic (congestive) heart failure: Secondary | ICD-10-CM | POA: Diagnosis not present

## 2019-09-14 DIAGNOSIS — T82898A Other specified complication of vascular prosthetic devices, implants and grafts, initial encounter: Secondary | ICD-10-CM

## 2019-09-14 HISTORY — PX: PERIPHERAL VASCULAR THROMBECTOMY: CATH118306

## 2019-09-14 LAB — POTASSIUM (ARMC VASCULAR LAB ONLY): Potassium (ARMC vascular lab): 4.9 (ref 3.5–5.1)

## 2019-09-14 LAB — GLUCOSE, CAPILLARY: Glucose-Capillary: 103 mg/dL — ABNORMAL HIGH (ref 70–99)

## 2019-09-14 SURGERY — PERIPHERAL VASCULAR THROMBECTOMY
Anesthesia: Moderate Sedation | Laterality: Right

## 2019-09-14 MED ORDER — CEFAZOLIN SODIUM-DEXTROSE 1-4 GM/50ML-% IV SOLN
INTRAVENOUS | Status: AC
  Filled 2019-09-14: qty 50

## 2019-09-14 MED ORDER — MIDAZOLAM HCL 2 MG/ML PO SYRP: 8.0000 mg | ORAL_SOLUTION | Freq: Once | ORAL | Status: DC | PRN

## 2019-09-14 MED ORDER — MIDAZOLAM HCL 2 MG/2ML IJ SOLN
INTRAMUSCULAR | Status: DC | PRN
  Administered 2019-09-14: 1 mg via INTRAVENOUS
  Administered 2019-09-14 (×2): 1 mg
  Administered 2019-09-14: 2 mg via INTRAVENOUS

## 2019-09-14 MED ORDER — HEPARIN SODIUM (PORCINE) 1000 UNIT/ML IJ SOLN
INTRAMUSCULAR | Status: DC | PRN
  Administered 2019-09-14: 4000 [IU] via INTRAVENOUS

## 2019-09-14 MED ORDER — ONDANSETRON HCL 4 MG/2ML IJ SOLN: 4.0000 mg | Freq: Four times a day (QID) | INTRAMUSCULAR | Status: DC | PRN

## 2019-09-14 MED ORDER — CEFAZOLIN SODIUM-DEXTROSE 1-4 GM/50ML-% IV SOLN
1.0000 g | Freq: Once | INTRAVENOUS | Status: AC
  Administered 2019-09-14: 1 g via INTRAVENOUS

## 2019-09-14 MED ORDER — SODIUM CHLORIDE 0.9 % IV SOLN: INTRAVENOUS | Status: DC

## 2019-09-14 MED ORDER — DIPHENHYDRAMINE HCL 50 MG/ML IJ SOLN: 50.0000 mg | Freq: Once | INTRAMUSCULAR | Status: DC | PRN

## 2019-09-14 MED ORDER — FENTANYL CITRATE (PF) 100 MCG/2ML IJ SOLN
INTRAMUSCULAR | Status: AC
  Filled 2019-09-14: qty 2

## 2019-09-14 MED ORDER — HYDROMORPHONE HCL 1 MG/ML IJ SOLN: 1.0000 mg | Freq: Once | INTRAMUSCULAR | Status: DC | PRN

## 2019-09-14 MED ORDER — HEPARIN SODIUM (PORCINE) 1000 UNIT/ML IJ SOLN
INTRAMUSCULAR | Status: AC
  Filled 2019-09-14: qty 1

## 2019-09-14 MED ORDER — FAMOTIDINE 20 MG PO TABS: 40.0000 mg | ORAL_TABLET | Freq: Once | ORAL | Status: DC | PRN

## 2019-09-14 MED ORDER — ALTEPLASE 2 MG IJ SOLR
INTRAMUSCULAR | Status: AC
  Filled 2019-09-14: qty 4

## 2019-09-14 MED ORDER — FENTANYL CITRATE (PF) 100 MCG/2ML IJ SOLN
INTRAMUSCULAR | Status: DC | PRN
  Administered 2019-09-14: 25 ug
  Administered 2019-09-14: 50 ug via INTRAVENOUS
  Administered 2019-09-14: 25 ug

## 2019-09-14 MED ORDER — MIDAZOLAM HCL 5 MG/5ML IJ SOLN
INTRAMUSCULAR | Status: AC
  Filled 2019-09-14: qty 5

## 2019-09-14 MED ORDER — METHYLPREDNISOLONE SODIUM SUCC 125 MG IJ SOLR: 125.0000 mg | Freq: Once | INTRAMUSCULAR | Status: DC | PRN

## 2019-09-14 SURGICAL SUPPLY — 15 items
BALLN LUTONIX 018 4X80X130 (BALLOONS) ×3
BALLN LUTONIX 018 5X80X130 (BALLOONS) ×3
BALLOON LUTONIX 018 4X80X130 (BALLOONS) IMPLANT
BALLOON LUTONIX 018 5X80X130 (BALLOONS) IMPLANT
CANNULA 5F STIFF (CANNULA) ×2 IMPLANT
CATH BEACON 5 .035 40 KMP TP (CATHETERS) IMPLANT
CATH BEACON 5 .038 40 KMP TP (CATHETERS) ×2
DEVICE PRESTO INFLATION (MISCELLANEOUS) ×2 IMPLANT
GLIDEWIRE STIFF .35X180X3 HYDR (WIRE) ×2 IMPLANT
PACK ANGIOGRAPHY (CUSTOM PROCEDURE TRAY) ×2 IMPLANT
SHEATH BRITE TIP 6FRX5.5 (SHEATH) ×2 IMPLANT
SHEATH BRITE TIP 7FRX5.5 (SHEATH) ×2 IMPLANT
SUT MNCRL AB 4-0 PS2 18 (SUTURE) ×2 IMPLANT
WIRE G 018X200 V18 (WIRE) ×2 IMPLANT
WIRE MAGIC TOR.035 180C (WIRE) ×4 IMPLANT

## 2019-09-14 NOTE — H&P (Signed)
[facility] Admission History & Physical  MRN : [id number]  Paul [name]. is a 54 y.o. (1965-06-25) male who presents with chief complaint of No chief complaint on file. Marland Kitchen  History of Present Illness: I am asked to evaluate the patient by the dialysis center. The patient was sent here because they were unable to cannulate the graft this last week. Furthermore the Center states there is no thrill or bruit. The patient states this is the first dialysis run to be missed. This problem is acute in onset and has been present for approximately 2 weeks. The patient is unaware of any other change.  Patient denies pain or tenderness overlying the access.  There is no pain with dialysis.  The patient denies hand pain or finger pain consistent with steal syndrome.   There have been many past interventions or declots of this access.  The patient is chronically hypotensive on dialysis.  Current Facility-Administered Medications  Medication Dose Route Frequency Provider Last Rate Last Admin  . ceFAZolin (ANCEF) 1-4 GM/50ML-% IVPB           . 0.9 %  sodium chloride infusion   Intravenous Continuous Kris Hartmann, NP      . ceFAZolin (ANCEF) IVPB 1 g/50 mL premix  1 g Intravenous Once Kris Hartmann, NP      . diphenhydrAMINE (BENADRYL) injection 50 mg  50 mg Intravenous Once PRN Kris Hartmann, NP      . famotidine (PEPCID) tablet 40 mg  40 mg Oral Once PRN Kris Hartmann, NP      . HYDROmorphone (DILAUDID) injection 1 mg  1 mg Intravenous Once PRN Eulogio Ditch E, NP      . methylPREDNISolone sodium succinate (SOLU-MEDROL) 125 mg/2 mL injection 125 mg  125 mg Intravenous Once PRN Eulogio Ditch E, NP      . midazolam (VERSED) 2 MG/ML syrup 8 mg  8 mg Oral Once PRN Kris Hartmann, NP      . ondansetron (ZOFRAN) injection 4 mg  4 mg Intravenous Q6H PRN Kris Hartmann, NP        Past Medical History:  Diagnosis Date  . Anemia   . Cardiac defibrillator in place    a.  Biotronik LUmax 540 DRT, (ser # [id number]).  . Carotid arterial disease ([place])    a. s/p prior LICA stenting;  b. 06/7780 Carotid U/S: 40-59% bilat ICA stenosis. Patent LICA stent.  . CHF (congestive heart failure) ([place])   . CKD (chronic kidney disease), stage III   . Coronary artery disease    a. 2010 s/p CABG x 3.  . DDD (degenerative disc disease), lumbosacral    L5-S1  . Diabetes ([place])    Lantus at bedtime  . Gangrene of toe of right foot ([place])   . Gout of left hand 10/06/2016  . HFrEF (heart failure with reduced ejection fraction) ([place])    a. 07/2016 Echo: EF 25-30%, diff HK, mild MR, mildly dil LA, mod reduced RV fxn, PASP 58mmHg.  Marland Kitchen Hypertension    takes Coreg daily  . Ischemic cardiomyopathy    a. 07/2016 Echo: EF 25-30%, diff HK.  . Myocardial infarction ([place]) 2009  . Peripheral vascular disease ([place])   . Sleep apnea    sleep study yr ago-unable to afford cpap  . Stroke [employer]) 09   no weakness    Past Surgical History:  Procedure Laterality Date  . AMPUTATION Right 12/23/2018  Procedure: AMPUTATION BELOW KNEE (Right);  Surgeon: Algernon Huxley, MD;  Location: [facility] OR[facility];  Service: General;  Laterality: Right;  . AMPUTATION TOE Right 08/22/2016   Procedure: AMPUTATION TOE;  Surgeon: Samara Deist, DPM;  Location: [facility];  Service: Podiatry;  Laterality: Right;  . AMPUTATION TOE Right 10/09/2016   Procedure: AMPUTATION TOE-RIGHT 2ND MPJ;  Surgeon: Samara Deist, DPM;  Location: [facility];  Service: Podiatry;  Laterality: Right;  . APLIGRAFT PLACEMENT Right 10/09/2016   Procedure: APLIGRAFT PLACEMENT;  Surgeon: Samara Deist, DPM;  Location: [facility];  Service: Podiatry;  Laterality: Right;  . AV FISTULA PLACEMENT Right 05/11/2019   Procedure: INSERTION OF ARTERIOVENOUS (AV) GORE-TEX GRAFT ARM;  Surgeon: Algernon Huxley, MD;  Location: [facility];  Service: Vascular;  Laterality: Right;  . CALCANEAL OSTEOTOMY Bilateral 07/28/2018   Procedure: RIGHT CALCANECTOMY BILATERAL DEBRIDEMENT OF  ULCERS ON HEELS;  Surgeon: Albertine Patricia, DPM;  Location: [facility];  Service: Podiatry;  Laterality: Bilateral;  . CARDIAC DEFIBRILLATOR PLACEMENT  2011  . CAROTID ENDARTERECTOMY Left   . CHOLECYSTECTOMY  2010  . CORONARY ARTERY BYPASS GRAFT  2010   CABG x 3   . DIALYSIS/PERMA CATHETER INSERTION N/A 10/07/2018   Procedure: DIALYSIS/PERMA CATHETER INSERTION;  Surgeon: Katha Cabal, MD;  Location: [place] [facility];  Service: Cardiovascular;  Laterality: N/A;  . GRAFT APPLICATION Right 3/61/4431   Procedure: FULL THICKNESS SKIN GRAFT-RIGHT FOOT;  Surgeon: Algernon Huxley, MD;  Location: [facility];  Service: Vascular;  Laterality: Right;  . HIP SURGERY Right 1994  . INCISION AND DRAINAGE Right 09/08/2018   Procedure: INCISION AND DRAINAGE - [place] OF DEFECTIVE SKIN, SOFT TISSUE AND BONE;  Surgeon: Albertine Patricia, DPM;  Location: [facility];  Service: Podiatry;  Laterality: Right;  . INCISION AND DRAINAGE OF WOUND Right 08/22/2016   Procedure: IRRIGATION AND DEBRIDEMENT WOUND and wound vac placement;  Surgeon: Samara Deist, DPM;  Location: [facility];  Service: Podiatry;  Laterality: Right;  . IRRIGATION AND DEBRIDEMENT ABSCESS Right 11/24/2018   Procedure: IRRIGATION AND DEBRIDEMENT ABSCESS RIGHT FOOT, COMPLICATED, DIABETIC;  Surgeon: Albertine Patricia, DPM;  Location: [facility];  Service: Podiatry;  Laterality: Right;  . LOWER EXTREMITY ANGIOGRAPHY Right 08/24/2016   Procedure: Lower Extremity Angiography;  Surgeon: Algernon Huxley, MD;  Location: [place] [facility];  Service: Cardiovascular;  Laterality: Right;  . LOWER EXTREMITY ANGIOGRAPHY Right 07/27/2018   Procedure: RIGHT Lower Extremity Angiography;  Surgeon: Algernon Huxley, MD;  Location: [place] CV LAB;  Service: Cardiovascular;  Laterality: Right;  . LOWER EXTREMITY ANGIOGRAPHY Right 09/09/2018   Procedure: Lower Extremity Angiography;  Surgeon: Katha Cabal, MD;  Location: [place] [facility];  Service: Cardiovascular;   Laterality: Right;  . MASS EXCISION Right 07/18/2014   Procedure: EXCISION HETEROTOPIC BONE RIGHT HIP;  Surgeon: Frederik Pear, MD;  Location: [place];  Service: Orthopedics;  Laterality: Right;  . Open Heart Surgery  2010   x 3  . PERIPHERAL VASCULAR THROMBECTOMY Right 07/31/2019   Procedure: PERIPHERAL VASCULAR THROMBECTOMY;  Surgeon: Algernon Huxley, MD;  Location: [place] [facility];  Service: Cardiovascular;  Laterality: Right;  . PERIPHERAL VASCULAR THROMBECTOMY Right 08/17/2019   Procedure: PERIPHERAL VASCULAR THROMBECTOMY;  Surgeon: Algernon Huxley, MD;  Location: [place] [facility];  Service: Cardiovascular;  Laterality: Right;  . WOUND DEBRIDEMENT Right 10/09/2016   Procedure: DEBRIDEMENT WOUND;  Surgeon: Samara Deist, DPM;  Location: [facility];  Service: Podiatry;  Laterality: Right;     Social  History   Tobacco Use  . Smoking status: Never Smoker  . Smokeless tobacco: Never Used  Substance Use Topics  . Alcohol use: No  . Drug use: Yes    Types: Marijuana     Family History  Problem Relation Age of Onset  . Hypertension Other   . Diabetes Other   . Diabetes Father   . Hyperlipidemia Father   . Hypertension Father   . Hyperlipidemia Sister   . Hypertension Sister   . Diabetes Sister   . Diabetes Brother   . Hypertension Brother   . Hyperlipidemia Brother     No family history of bleeding or clotting disorders, autoimmune disease or porphyria  Allergies  Allergen Reactions  . Other Other (See Comments)    Cardiac Problems. Pt states he tolerates Toradol. Due to kidney and heart problems per pt  . Ibuprofen Other (See Comments)    Heart problems  . Baclofen Other (See Comments)  . Metformin Diarrhea  . Nsaids     Due to kidney and heart problems per pt     REVIEW OF SYSTEMS (Negative unless checked)  Constitutional: [] Weight loss  [] Fever  [] Chills Cardiac: [] Chest pain   [] Chest pressure   [] Palpitations   [] Shortness of breath when laying flat   [] Shortness  of breath at rest   [x] Shortness of breath with exertion. Vascular:  [] Pain in legs with walking   [] Pain in legs at rest   [] Pain in legs when laying flat   [] Claudication   [] Pain in feet when walking  [] Pain in feet at rest  [] Pain in feet when laying flat   [] History of DVT   [] Phlebitis   [] Swelling in legs   [] Varicose veins   [] Non-healing ulcers Pulmonary:   [] Uses home oxygen   [] Productive cough   [] Hemoptysis   [] Wheeze  [] COPD   [] Asthma Neurologic:  [] Dizziness  [] Blackouts   [] Seizures   [] History of stroke   [] History of TIA  [] Aphasia   [] Temporary blindness   [] Dysphagia   [] Weakness or numbness in arms   [] Weakness or numbness in legs Musculoskeletal:  [x] Arthritis   [] Joint swelling   [x] Joint pain   [] Low back pain Hematologic:  [] Easy bruising  [] Easy bleeding   [] Hypercoagulable state   [] Anemic  [] Hepatitis Gastrointestinal:  [] Blood in stool   [] Vomiting blood  [x] Gastroesophageal reflux/heartburn   [] Difficulty swallowing. Genitourinary:  [x] Chronic kidney disease   [] Difficult urination  [] Frequent urination  [] Burning with urination   [] Blood in urine Skin:  [] Rashes   [] Ulcers   [] Wounds Psychological:  [] History of anxiety   []  History of major depression.  Physical Examination  There were no vitals filed for this visit. There is no height or weight on file to calculate BMI. Gen: WD/WN, NAD Head: [place]/AT, No temporalis wasting.  Ear/Nose/Throat: Hearing grossly intact, nares w/o erythema or drainage, oropharynx w/o Erythema/Exudate,  Eyes: Conjunctiva clear, sclera non-icteric Neck: Trachea midline.  No JVD.  Pulmonary:  Good air movement, respirations not labored, no use of accessory muscles.  Cardiac: RRR, normal S1, S2. Vascular: no thrill in right arm AVG Vessel Right Left  Radial Palpable Palpable   Musculoskeletal: M/S 5/5 throughout.  Extremities without ischemic changes.  No deformity or atrophy.  Neurologic: Sensation grossly intact in extremities.   Symmetrical.  Speech is fluent. Motor exam as listed above. Psychiatric: Judgment intact, Mood & affect appropriate for pt's clinical situation. Dermatologic: No rashes or ulcers noted.  No cellulitis or open wounds.  CBC Lab Results  Component Value Date   WBC 13.8 (H) 07/04/2019   HGB 11.3 (L) 07/04/2019   HCT 36.4 (L) 07/04/2019   MCV 97.8 07/04/2019   PLT 231 07/04/2019    BMET    Component Value Date/Time   NA 139 07/04/2019 1348   NA 139 06/23/2016 1553   K 4.1 07/04/2019 1348   CL 105 07/04/2019 1348   CO2 19 (L) 07/04/2019 1348   GLUCOSE 171 (H) 07/04/2019 1348   BUN 56 (H) 07/04/2019 1348   BUN 46 (H) 06/23/2016 1553   CREATININE 10.52 (H) 07/04/2019 1348   CALCIUM 8.0 (L) 07/04/2019 1348   GFRNONAA 5 (L) 07/04/2019 1348   GFRAA 6 (L) 07/04/2019 1348   CrCl cannot be calculated (Patient's most recent lab result is older than the maximum 21 days allowed.).  COAG Lab Results  Component Value Date   INR 1.1 05/11/2019   INR 1.0 05/09/2019   INR 1.1 12/23/2018    Radiology PERIPHERAL VASCULAR CATHETERIZATION  Result Date: 08/17/2019 See op note   Assessment/Plan 1.  Complication dialysis device with thrombosis AV access:  Patient's dialysis access is thrombosed again. The patient will undergo thrombectomy using interventional techniques.  The risks and benefits were described to the patient.  All questions were answered.  The patient agrees to proceed with angiography and intervention. Potassium will be drawn to ensure that it is an appropriate level prior to performing thrombectomy. 2.  End-stage renal disease requiring hemodialysis:  Patient will continue dialysis therapy without further interruption if a successful thrombectomy is not achieved then catheter will be placed. Dialysis has already been arranged since the patient missed their previous session 3.  Hypertension:  Patient will continue medical management; nephrology is following no changes in oral  medications. 4. Diabetes mellitus:  Glucose will be monitored and oral medications been held this morning once the patient has undergone the patient's procedure po intake will be reinitiated and again Accu-Cheks will be used to assess the blood glucose level and treat as needed. The patient will be restarted on the patient's usual hypoglycemic regime 5.  Coronary artery disease:  EKG will be monitored. Nitrates will be used if needed. The patient's oral cardiac medications will be continued.    Leotis Pain, MD  09/14/2019 8:16 AM

## 2019-09-14 NOTE — Op Note (Addendum)
[facility]    OPERATIVE NOTE   PROCEDURE: 1.  Right brachial artery to axillary vein arteriovenous graft cannulation under ultrasound guidance 2.  Right arm shuntogram and right upper extremity angiogram 3.  Catheter placement into right brachial artery with a retrograde approach through the graft 4.  Percutaneous transluminal angioplasty of the anastomosis of the graft the radial artery and the radial artery just proximal to the anastomosis with 4 mm diameter drug-coated and 5 mm diameter angioplasty balloons  PRE-OPERATIVE DIAGNOSIS: 1. ESRD 2. Malfunctioning right brachial artery to axillary vein arteriovenous graft  POST-OPERATIVE DIAGNOSIS: same as above   SURGEON: Paul Pain, MD  ANESTHESIA: local with MCS  ESTIMATED BLOOD LOSS: 5 cc  FINDING(S): 1. The graft was not thrombosed.  There was a high brachial bifurcation and the graft was actually based off of the radial artery just above the antecubital fossa.  The brachial bifurcation was at the level of the proximal brachial artery or axillary artery.  With the catheter parked in this location, it appeared that the ulnar artery had good flow without stenosis and that the graft was actually filling retrograde off the radial artery.  There was a high-grade stenosis of the radial artery at and just proximal to the anastomosis in the 85-90% range that was limiting inflow and making the graft unusable.  This was in addition to his hypotension issues creating poor graft function.  SPECIMEN(S):  None  CONTRAST: 30 cc  FLUORO TIME: 2.7 minutes  MODERATE CONSCIOUS SEDATION TIME:  Approximately 30 minutes using 5 mg of Versed and 100 mcg of Fentanyl  INDICATIONS: Paul [name]. is a 54 y.o. male who presents with malfunctioning right upper arm arteriovenous graft.  Could not be palpated at dialysis and they felt it was thrombosed.  The patient is scheduled for right arm shuntogram.  The patient is aware the  risks include but are not limited to: bleeding, infection, thrombosis of the cannulated access, and possible anaphylactic reaction to the contrast.  The patient is aware of the risks of the procedure and elects to proceed forward.  DESCRIPTION: After full informed written consent was obtained, the patient was brought back to the angiography suite and placed supine upon the angiography table.  The patient was connected to monitoring equipment. Moderate conscious sedation was administered during a face to face encounter throughout the procedure with my supervision of the RN administering medicines and monitoring the patient's vital signs, pulse oximetry, telemetry and mental status throughout from the start of the procedure until the patient was taken to the recovery room The right arm was prepped and draped in the standard fashion for a percutaneous access intervention.  Under ultrasound guidance, the right upper arm AV graft arteriovenous graft was cannulated with a micropuncture needle under direct ultrasound guidance were it was patent in a retrograde fashion and a permanent image was performed.  The microwire was advanced into the graft and the needle was exchanged for the a microsheath.  I then upsized to a 6 Fr Sheath and imaging was performed.  Hand injections were completed to image the access including the central venous system. This demonstrated the AV graft was patent and there was no stenosis within the previously placed stent or in the central venous circulation, but the flow was sluggish.  Based off this, I used a Glidewire and a Kumpe catheter and got into the arterial circuit just proximal to the graft.  Imaging was performed showing an 85-90%  near occlusive stenosis of the artery at and just proximal to the anastomosis.  With this arterial image, there was contrast that flowed back into the arterial circuit and there was clearly a high brachial bifurcation.  I then used a Glidewire and the Kumpe  catheter to advance sequentially out to the most proximal brachial artery at the level of the bifurcation.  This demonstrated that the graft was actually based off of the radial artery.  The ulnar artery was the dominant flow distally and had brisk flow.  The graft was actually filling retrograde off of the radial artery after the ulnar artery flow distally.  Based on the images, this patient will need intervention to the arterial stenosis to salvage the graft. I then gave the patient 4000 units of intravenous heparin.  I then crossed the stenosis with a V18 wire.  Based on the imaging, a 4 mm x 8 cm Lutonix drug-coated angioplasty balloon was selected.  The balloon was centered around the stenosis and inflated to 14 ATM for 1 minute(s).  This was slightly undersized so I upsized to a 5 mm diameter by 6 cm length angioplasty balloon and inflated this to 8 atm for 1 minute.  On completion imaging, a less than 20% residual stenosis was present there was now brisk flow through the graft with an injection in the radial artery just proximal to the anastomosis.     Based on the completion imaging, no further intervention is necessary.  The wire and balloon were removed from the sheath.  A 4-0 Monocryl purse-string suture was sewn around the sheath.  The sheath was removed while tying down the suture.  A sterile bandage was applied to the puncture site.  COMPLICATIONS: None  CONDITION: Stable   Paul [name]  09/14/2019 10:04 AM    This note was created with Dragon Medical transcription system. Any errors in dictation are purely unintentional.

## 2019-09-14 NOTE — Discharge Instructions (Signed)
Moderate Conscious Sedation, Adult, Care After These instructions provide you with information about caring for yourself after your procedure. Your health care provider may also give you more specific instructions. Your treatment has been planned according to current medical practices, but problems sometimes occur. Call your health care provider if you have any problems or questions after your procedure. What can I expect after the procedure? After your procedure, it is common:  To feel sleepy for several hours.  To feel clumsy and have poor balance for several hours.  To have poor judgment for several hours.  To vomit if you eat too soon. Follow these instructions at home: For at least 24 hours after the procedure:   Do not: ? Participate in activities where you could fall or become injured. ? Drive. ? Use heavy machinery. ? Drink alcohol. ? Take sleeping pills or medicines that cause drowsiness. ? Make important decisions or sign legal documents. ? Take care of children on your own.  Rest. Eating and drinking  Follow the diet recommended by your health care provider.  If you vomit: ? Drink water, juice, or soup when you can drink without vomiting. ? Make sure you have little or no nausea before eating solid foods. General instructions  Have a responsible adult stay with you until you are awake and alert.  Take over-the-counter and prescription medicines only as told by your health care provider.  If you smoke, do not smoke without supervision.  Keep all follow-up visits as told by your health care provider. This is important. Contact a health care provider if:  You keep feeling nauseous or you keep vomiting.  You feel light-headed.  You develop a rash.  You have a fever. Get help right away if:  You have trouble breathing. This information is not intended to replace advice given to you by your health care provider. Make sure you discuss any questions you have  with your health care provider. Document Revised: 04/02/2017 Document Reviewed: 08/10/2015 Elsevier Patient Education  2020 [employer] Dialysis Fistulogram, Care After This sheet gives you information about how to care for yourself after your procedure. Your health care provider may also give you more specific instructions. If you have problems or questions, contact your health care provider. What can I expect after the procedure? After the procedure, it is common to have:  A small amount of discomfort in the area where the small, thin tube (catheter) was placed for the procedure.  A small amount of bruising around the fistula.  Sleepiness and tiredness (fatigue). Follow these instructions at home: Activity   Rest at home and do not lift anything that is heavier than 5 lb (2.3 kg) on the day after your procedure.  Return to your normal activities as told by your health care provider. Ask your health care provider what activities are safe for you.  Do not drive or use heavy machinery while taking prescription pain medicine.  Do not drive for 24 hours if you were given a medicine to help you relax (sedative) during your procedure. Medicines   Take over-the-counter and prescription medicines only as told by your health care provider. Puncture site care  Follow instructions from your health care provider about how to take care of the site where catheters were inserted. Make sure you: ? Wash your hands with soap and water before you change your bandage (dressing). If soap and water are not available, use hand sanitizer. ? Change your dressing as told by your health   care provider. ? Leave stitches (sutures), skin glue, or adhesive strips in place. These skin closures may need to stay in place for 2 weeks or longer. If adhesive strip edges start to loosen and curl up, you may trim the loose edges. Do not remove adhesive strips completely unless your health care provider tells you to do  that.  Check your puncture area every day for signs of infection. Check for: ? Redness, swelling, or pain. ? Fluid or blood. ? Warmth. ? Pus or a bad smell. General instructions  Do not take baths, swim, or use a hot tub until your health care provider approves. Ask your health care provider if you may take showers. You may only be allowed to take sponge baths.  Monitor your dialysis fistula closely. Check to make sure that you can feel a vibration or buzz (a thrill) when you put your fingers over the fistula.  Prevent damage to your graft or fistula: ? Do not wear tight-fitting clothing or jewelry on the arm or leg that has your graft or fistula. ? Tell all your health care providers that you have a dialysis fistula or graft. ? Do not allow blood draws, IVs, or blood pressure readings to be done in the arm that has your fistula or graft. ? Do not allow flu shots or vaccinations in the arm with your fistula or graft.  Keep all follow-up visits as told by your health care provider. This is important. Contact a health care provider if:  You have redness, swelling, or pain at the site where the catheter was put in.  You have fluid or blood coming from the catheter site.  The catheter site feels warm to the touch.  You have pus or a bad smell coming from the catheter site.  You have a fever or chills. Get help right away if:  You feel weak.  You have trouble balancing.  You have trouble moving your arms or legs.  You have problems with your speech or vision.  You can no longer feel a vibration or buzz when you put your fingers over your dialysis fistula.  The limb that was used for the procedure: ? Swells. ? Is painful. ? Is cold. ? Is discolored, such as blue or pale white.  You have chest pain or shortness of breath. Summary  After a dialysis fistulogram, it is common to have a small amount of discomfort or bruising in the area where the small, thin tube (catheter)  was placed.  Rest at home on the day after your procedure. Return to your normal activities as told by your health care provider.  Take over-the-counter and prescription medicines only as told by your health care provider.  Follow instructions from your health care provider about how to take care of the site where the catheter was inserted.  Keep all follow-up visits as told by your health care provider. This information is not intended to replace advice given to you by your health care provider. Make sure you discuss any questions you have with your health care provider. Document Revised: 05/21/2017 Document Reviewed: 05/21/2017 Elsevier Patient Education  2020 [employer] Inc[employer]  

## 2019-09-15 DIAGNOSIS — Z992 Dependence on renal dialysis: Secondary | ICD-10-CM | POA: Diagnosis not present

## 2019-09-15 DIAGNOSIS — N186 End stage renal disease: Secondary | ICD-10-CM | POA: Diagnosis not present

## 2019-09-18 DIAGNOSIS — Z992 Dependence on renal dialysis: Secondary | ICD-10-CM | POA: Diagnosis not present

## 2019-09-18 DIAGNOSIS — N186 End stage renal disease: Secondary | ICD-10-CM | POA: Diagnosis not present

## 2019-09-19 ENCOUNTER — Other Ambulatory Visit: Payer: Self-pay

## 2019-09-19 DIAGNOSIS — E1122 Type 2 diabetes mellitus with diabetic chronic kidney disease: Secondary | ICD-10-CM | POA: Diagnosis not present

## 2019-09-19 DIAGNOSIS — N186 End stage renal disease: Secondary | ICD-10-CM | POA: Diagnosis not present

## 2019-09-19 DIAGNOSIS — I132 Hypertensive heart and chronic kidney disease with heart failure and with stage 5 chronic kidney disease, or end stage renal disease: Secondary | ICD-10-CM | POA: Diagnosis not present

## 2019-09-19 DIAGNOSIS — I5020 Unspecified systolic (congestive) heart failure: Secondary | ICD-10-CM | POA: Diagnosis not present

## 2019-09-19 DIAGNOSIS — I255 Ischemic cardiomyopathy: Secondary | ICD-10-CM | POA: Diagnosis not present

## 2019-09-19 DIAGNOSIS — I2510 Atherosclerotic heart disease of native coronary artery without angina pectoris: Secondary | ICD-10-CM | POA: Diagnosis not present

## 2019-09-19 DIAGNOSIS — Z4781 Encounter for orthopedic aftercare following surgical amputation: Secondary | ICD-10-CM | POA: Diagnosis not present

## 2019-09-19 DIAGNOSIS — Z89511 Acquired absence of right leg below knee: Secondary | ICD-10-CM | POA: Diagnosis not present

## 2019-09-19 DIAGNOSIS — E1151 Type 2 diabetes mellitus with diabetic peripheral angiopathy without gangrene: Secondary | ICD-10-CM | POA: Diagnosis not present

## 2019-09-19 NOTE — Patient Outreach (Signed)
[facility] Care Management  09/19/2019  Paul [name]. Sep 08, 1965 [id number]   Telephone Assessment   Outreach attempt to patient. Spoke with patient who voices he is doing well. He denies acute issues or concerns at prexent. He continues to go to dialysis on M,W,F. Patient reports that he has been having issues with his BP dropping and running low. He has been started on Midodrine. Patient states he does not have BP machine in the home and RN CM encouraged him to get one. He reports that he does not monitor cbgs in the home as his Diabetes is "diet controlled." He states he just had his A1C level checked at dialysis center a few weeks ago and it was 6.4. He reports decreased appetite and only eating about once or twice a day.he does not like taste of nutritional supplements. Patient reports that he continues to get [facility] services but has been told they will be discharging him soon. He statesnurse was out today to provide wound care and he was told wound was healing and looking good.       Plan: RN CM discussed with patient next outreach within the month of June. Patient gave verbal consent and in agreement with RN CM follow up and timeframe. Patient aware that they may contact RN CM sooner for any issues or concerns.    [name], RN,BSN,CCM [facility] Telephonic Care Management Coordinator Direct Phone: [phone] Toll Free: [phone] Fax: [phone]

## 2019-09-20 ENCOUNTER — Ambulatory Visit: Payer: Self-pay

## 2019-09-20 DIAGNOSIS — N186 End stage renal disease: Secondary | ICD-10-CM | POA: Diagnosis not present

## 2019-09-20 DIAGNOSIS — Z992 Dependence on renal dialysis: Secondary | ICD-10-CM | POA: Diagnosis not present

## 2019-09-21 DIAGNOSIS — I255 Ischemic cardiomyopathy: Secondary | ICD-10-CM | POA: Diagnosis not present

## 2019-09-21 DIAGNOSIS — Z89511 Acquired absence of right leg below knee: Secondary | ICD-10-CM | POA: Diagnosis not present

## 2019-09-21 DIAGNOSIS — I2510 Atherosclerotic heart disease of native coronary artery without angina pectoris: Secondary | ICD-10-CM | POA: Diagnosis not present

## 2019-09-21 DIAGNOSIS — E1151 Type 2 diabetes mellitus with diabetic peripheral angiopathy without gangrene: Secondary | ICD-10-CM | POA: Diagnosis not present

## 2019-09-21 DIAGNOSIS — E1122 Type 2 diabetes mellitus with diabetic chronic kidney disease: Secondary | ICD-10-CM | POA: Diagnosis not present

## 2019-09-21 DIAGNOSIS — Z4781 Encounter for orthopedic aftercare following surgical amputation: Secondary | ICD-10-CM | POA: Diagnosis not present

## 2019-09-21 DIAGNOSIS — N186 End stage renal disease: Secondary | ICD-10-CM | POA: Diagnosis not present

## 2019-09-21 DIAGNOSIS — I132 Hypertensive heart and chronic kidney disease with heart failure and with stage 5 chronic kidney disease, or end stage renal disease: Secondary | ICD-10-CM | POA: Diagnosis not present

## 2019-09-21 DIAGNOSIS — H2589 Other age-related cataract: Secondary | ICD-10-CM | POA: Diagnosis not present

## 2019-09-21 DIAGNOSIS — I5020 Unspecified systolic (congestive) heart failure: Secondary | ICD-10-CM | POA: Diagnosis not present

## 2019-09-22 DIAGNOSIS — Z89511 Acquired absence of right leg below knee: Secondary | ICD-10-CM | POA: Diagnosis not present

## 2019-09-22 DIAGNOSIS — Z4781 Encounter for orthopedic aftercare following surgical amputation: Secondary | ICD-10-CM | POA: Diagnosis not present

## 2019-09-22 DIAGNOSIS — I255 Ischemic cardiomyopathy: Secondary | ICD-10-CM | POA: Diagnosis not present

## 2019-09-22 DIAGNOSIS — Z992 Dependence on renal dialysis: Secondary | ICD-10-CM | POA: Diagnosis not present

## 2019-09-22 DIAGNOSIS — E1151 Type 2 diabetes mellitus with diabetic peripheral angiopathy without gangrene: Secondary | ICD-10-CM | POA: Diagnosis not present

## 2019-09-22 DIAGNOSIS — I5020 Unspecified systolic (congestive) heart failure: Secondary | ICD-10-CM | POA: Diagnosis not present

## 2019-09-22 DIAGNOSIS — I132 Hypertensive heart and chronic kidney disease with heart failure and with stage 5 chronic kidney disease, or end stage renal disease: Secondary | ICD-10-CM | POA: Diagnosis not present

## 2019-09-22 DIAGNOSIS — I2510 Atherosclerotic heart disease of native coronary artery without angina pectoris: Secondary | ICD-10-CM | POA: Diagnosis not present

## 2019-09-22 DIAGNOSIS — E1122 Type 2 diabetes mellitus with diabetic chronic kidney disease: Secondary | ICD-10-CM | POA: Diagnosis not present

## 2019-09-22 DIAGNOSIS — N186 End stage renal disease: Secondary | ICD-10-CM | POA: Diagnosis not present

## 2019-09-25 DIAGNOSIS — N186 End stage renal disease: Secondary | ICD-10-CM | POA: Diagnosis not present

## 2019-09-25 DIAGNOSIS — Z992 Dependence on renal dialysis: Secondary | ICD-10-CM | POA: Diagnosis not present

## 2019-09-26 DIAGNOSIS — E1151 Type 2 diabetes mellitus with diabetic peripheral angiopathy without gangrene: Secondary | ICD-10-CM | POA: Diagnosis not present

## 2019-09-26 DIAGNOSIS — N186 End stage renal disease: Secondary | ICD-10-CM | POA: Diagnosis not present

## 2019-09-26 DIAGNOSIS — I5020 Unspecified systolic (congestive) heart failure: Secondary | ICD-10-CM | POA: Diagnosis not present

## 2019-09-26 DIAGNOSIS — I2510 Atherosclerotic heart disease of native coronary artery without angina pectoris: Secondary | ICD-10-CM | POA: Diagnosis not present

## 2019-09-26 DIAGNOSIS — I132 Hypertensive heart and chronic kidney disease with heart failure and with stage 5 chronic kidney disease, or end stage renal disease: Secondary | ICD-10-CM | POA: Diagnosis not present

## 2019-09-26 DIAGNOSIS — Z4781 Encounter for orthopedic aftercare following surgical amputation: Secondary | ICD-10-CM | POA: Diagnosis not present

## 2019-09-26 DIAGNOSIS — Z89511 Acquired absence of right leg below knee: Secondary | ICD-10-CM | POA: Diagnosis not present

## 2019-09-26 DIAGNOSIS — I255 Ischemic cardiomyopathy: Secondary | ICD-10-CM | POA: Diagnosis not present

## 2019-09-26 DIAGNOSIS — E1122 Type 2 diabetes mellitus with diabetic chronic kidney disease: Secondary | ICD-10-CM | POA: Diagnosis not present

## 2019-09-27 DIAGNOSIS — Z992 Dependence on renal dialysis: Secondary | ICD-10-CM | POA: Diagnosis not present

## 2019-09-27 DIAGNOSIS — N186 End stage renal disease: Secondary | ICD-10-CM | POA: Diagnosis not present

## 2019-09-28 DIAGNOSIS — I5020 Unspecified systolic (congestive) heart failure: Secondary | ICD-10-CM | POA: Diagnosis not present

## 2019-09-28 DIAGNOSIS — I255 Ischemic cardiomyopathy: Secondary | ICD-10-CM | POA: Diagnosis not present

## 2019-09-28 DIAGNOSIS — N186 End stage renal disease: Secondary | ICD-10-CM | POA: Diagnosis not present

## 2019-09-28 DIAGNOSIS — Z89511 Acquired absence of right leg below knee: Secondary | ICD-10-CM | POA: Diagnosis not present

## 2019-09-28 DIAGNOSIS — Z4781 Encounter for orthopedic aftercare following surgical amputation: Secondary | ICD-10-CM | POA: Diagnosis not present

## 2019-09-28 DIAGNOSIS — I132 Hypertensive heart and chronic kidney disease with heart failure and with stage 5 chronic kidney disease, or end stage renal disease: Secondary | ICD-10-CM | POA: Diagnosis not present

## 2019-09-28 DIAGNOSIS — E1151 Type 2 diabetes mellitus with diabetic peripheral angiopathy without gangrene: Secondary | ICD-10-CM | POA: Diagnosis not present

## 2019-09-28 DIAGNOSIS — E1122 Type 2 diabetes mellitus with diabetic chronic kidney disease: Secondary | ICD-10-CM | POA: Diagnosis not present

## 2019-09-28 DIAGNOSIS — I2510 Atherosclerotic heart disease of native coronary artery without angina pectoris: Secondary | ICD-10-CM | POA: Diagnosis not present

## 2019-09-29 DIAGNOSIS — M5136 Other intervertebral disc degeneration, lumbar region: Secondary | ICD-10-CM | POA: Diagnosis not present

## 2019-09-29 DIAGNOSIS — R600 Localized edema: Secondary | ICD-10-CM | POA: Diagnosis not present

## 2019-09-29 DIAGNOSIS — I5042 Chronic combined systolic (congestive) and diastolic (congestive) heart failure: Secondary | ICD-10-CM | POA: Diagnosis not present

## 2019-09-29 DIAGNOSIS — I679 Cerebrovascular disease, unspecified: Secondary | ICD-10-CM | POA: Diagnosis not present

## 2019-09-29 DIAGNOSIS — N186 End stage renal disease: Secondary | ICD-10-CM | POA: Diagnosis not present

## 2019-09-29 DIAGNOSIS — S88111A Complete traumatic amputation at level between knee and ankle, right lower leg, initial encounter: Secondary | ICD-10-CM | POA: Diagnosis not present

## 2019-09-29 DIAGNOSIS — Z992 Dependence on renal dialysis: Secondary | ICD-10-CM | POA: Diagnosis not present

## 2019-09-29 DIAGNOSIS — N184 Chronic kidney disease, stage 4 (severe): Secondary | ICD-10-CM | POA: Diagnosis not present

## 2019-10-02 DIAGNOSIS — Z992 Dependence on renal dialysis: Secondary | ICD-10-CM | POA: Diagnosis not present

## 2019-10-02 DIAGNOSIS — N186 End stage renal disease: Secondary | ICD-10-CM | POA: Diagnosis not present

## 2019-10-03 DIAGNOSIS — Z89511 Acquired absence of right leg below knee: Secondary | ICD-10-CM | POA: Diagnosis not present

## 2019-10-03 DIAGNOSIS — I132 Hypertensive heart and chronic kidney disease with heart failure and with stage 5 chronic kidney disease, or end stage renal disease: Secondary | ICD-10-CM | POA: Diagnosis not present

## 2019-10-03 DIAGNOSIS — S80922D Unspecified superficial injury of left lower leg, subsequent encounter: Secondary | ICD-10-CM | POA: Diagnosis not present

## 2019-10-03 DIAGNOSIS — I5020 Unspecified systolic (congestive) heart failure: Secondary | ICD-10-CM | POA: Diagnosis not present

## 2019-10-03 DIAGNOSIS — E1122 Type 2 diabetes mellitus with diabetic chronic kidney disease: Secondary | ICD-10-CM | POA: Diagnosis not present

## 2019-10-03 DIAGNOSIS — Z4781 Encounter for orthopedic aftercare following surgical amputation: Secondary | ICD-10-CM | POA: Diagnosis not present

## 2019-10-03 DIAGNOSIS — I255 Ischemic cardiomyopathy: Secondary | ICD-10-CM | POA: Diagnosis not present

## 2019-10-03 DIAGNOSIS — E1151 Type 2 diabetes mellitus with diabetic peripheral angiopathy without gangrene: Secondary | ICD-10-CM | POA: Diagnosis not present

## 2019-10-03 DIAGNOSIS — I2510 Atherosclerotic heart disease of native coronary artery without angina pectoris: Secondary | ICD-10-CM | POA: Diagnosis not present

## 2019-10-04 DIAGNOSIS — N186 End stage renal disease: Secondary | ICD-10-CM | POA: Diagnosis not present

## 2019-10-04 DIAGNOSIS — Z992 Dependence on renal dialysis: Secondary | ICD-10-CM | POA: Diagnosis not present

## 2019-10-05 DIAGNOSIS — I2510 Atherosclerotic heart disease of native coronary artery without angina pectoris: Secondary | ICD-10-CM | POA: Diagnosis not present

## 2019-10-05 DIAGNOSIS — Z89511 Acquired absence of right leg below knee: Secondary | ICD-10-CM | POA: Diagnosis not present

## 2019-10-05 DIAGNOSIS — I5022 Chronic systolic (congestive) heart failure: Secondary | ICD-10-CM | POA: Diagnosis not present

## 2019-10-05 DIAGNOSIS — I255 Ischemic cardiomyopathy: Secondary | ICD-10-CM | POA: Diagnosis not present

## 2019-10-05 DIAGNOSIS — E1151 Type 2 diabetes mellitus with diabetic peripheral angiopathy without gangrene: Secondary | ICD-10-CM | POA: Diagnosis not present

## 2019-10-05 DIAGNOSIS — I132 Hypertensive heart and chronic kidney disease with heart failure and with stage 5 chronic kidney disease, or end stage renal disease: Secondary | ICD-10-CM | POA: Diagnosis not present

## 2019-10-05 DIAGNOSIS — Z4781 Encounter for orthopedic aftercare following surgical amputation: Secondary | ICD-10-CM | POA: Diagnosis not present

## 2019-10-05 DIAGNOSIS — E1122 Type 2 diabetes mellitus with diabetic chronic kidney disease: Secondary | ICD-10-CM | POA: Diagnosis not present

## 2019-10-05 DIAGNOSIS — S80922D Unspecified superficial injury of left lower leg, subsequent encounter: Secondary | ICD-10-CM | POA: Diagnosis not present

## 2019-10-05 DIAGNOSIS — I5020 Unspecified systolic (congestive) heart failure: Secondary | ICD-10-CM | POA: Diagnosis not present

## 2019-10-06 DIAGNOSIS — N186 End stage renal disease: Secondary | ICD-10-CM | POA: Diagnosis not present

## 2019-10-06 DIAGNOSIS — Z992 Dependence on renal dialysis: Secondary | ICD-10-CM | POA: Diagnosis not present

## 2019-10-09 DIAGNOSIS — N186 End stage renal disease: Secondary | ICD-10-CM | POA: Diagnosis not present

## 2019-10-09 DIAGNOSIS — Z992 Dependence on renal dialysis: Secondary | ICD-10-CM | POA: Diagnosis not present

## 2019-10-10 DIAGNOSIS — Z89511 Acquired absence of right leg below knee: Secondary | ICD-10-CM | POA: Diagnosis not present

## 2019-10-10 DIAGNOSIS — Z4781 Encounter for orthopedic aftercare following surgical amputation: Secondary | ICD-10-CM | POA: Diagnosis not present

## 2019-10-10 DIAGNOSIS — I255 Ischemic cardiomyopathy: Secondary | ICD-10-CM | POA: Diagnosis not present

## 2019-10-10 DIAGNOSIS — S80922D Unspecified superficial injury of left lower leg, subsequent encounter: Secondary | ICD-10-CM | POA: Diagnosis not present

## 2019-10-10 DIAGNOSIS — I2510 Atherosclerotic heart disease of native coronary artery without angina pectoris: Secondary | ICD-10-CM | POA: Diagnosis not present

## 2019-10-10 DIAGNOSIS — E1151 Type 2 diabetes mellitus with diabetic peripheral angiopathy without gangrene: Secondary | ICD-10-CM | POA: Diagnosis not present

## 2019-10-10 DIAGNOSIS — I5020 Unspecified systolic (congestive) heart failure: Secondary | ICD-10-CM | POA: Diagnosis not present

## 2019-10-10 DIAGNOSIS — I132 Hypertensive heart and chronic kidney disease with heart failure and with stage 5 chronic kidney disease, or end stage renal disease: Secondary | ICD-10-CM | POA: Diagnosis not present

## 2019-10-10 DIAGNOSIS — E1122 Type 2 diabetes mellitus with diabetic chronic kidney disease: Secondary | ICD-10-CM | POA: Diagnosis not present

## 2019-10-11 DIAGNOSIS — Z992 Dependence on renal dialysis: Secondary | ICD-10-CM | POA: Diagnosis not present

## 2019-10-11 DIAGNOSIS — N186 End stage renal disease: Secondary | ICD-10-CM | POA: Diagnosis not present

## 2019-10-12 DIAGNOSIS — E1122 Type 2 diabetes mellitus with diabetic chronic kidney disease: Secondary | ICD-10-CM | POA: Diagnosis not present

## 2019-10-12 DIAGNOSIS — E1151 Type 2 diabetes mellitus with diabetic peripheral angiopathy without gangrene: Secondary | ICD-10-CM | POA: Diagnosis not present

## 2019-10-12 DIAGNOSIS — Z89511 Acquired absence of right leg below knee: Secondary | ICD-10-CM | POA: Diagnosis not present

## 2019-10-12 DIAGNOSIS — S80922D Unspecified superficial injury of left lower leg, subsequent encounter: Secondary | ICD-10-CM | POA: Diagnosis not present

## 2019-10-12 DIAGNOSIS — I255 Ischemic cardiomyopathy: Secondary | ICD-10-CM | POA: Diagnosis not present

## 2019-10-12 DIAGNOSIS — I2510 Atherosclerotic heart disease of native coronary artery without angina pectoris: Secondary | ICD-10-CM | POA: Diagnosis not present

## 2019-10-12 DIAGNOSIS — I5020 Unspecified systolic (congestive) heart failure: Secondary | ICD-10-CM | POA: Diagnosis not present

## 2019-10-12 DIAGNOSIS — I132 Hypertensive heart and chronic kidney disease with heart failure and with stage 5 chronic kidney disease, or end stage renal disease: Secondary | ICD-10-CM | POA: Diagnosis not present

## 2019-10-12 DIAGNOSIS — Z4781 Encounter for orthopedic aftercare following surgical amputation: Secondary | ICD-10-CM | POA: Diagnosis not present

## 2019-10-13 DIAGNOSIS — N186 End stage renal disease: Secondary | ICD-10-CM | POA: Diagnosis not present

## 2019-10-13 DIAGNOSIS — Z992 Dependence on renal dialysis: Secondary | ICD-10-CM | POA: Diagnosis not present

## 2019-10-16 ENCOUNTER — Other Ambulatory Visit (INDEPENDENT_AMBULATORY_CARE_PROVIDER_SITE_OTHER): Payer: Self-pay | Admitting: Nurse Practitioner

## 2019-10-16 DIAGNOSIS — Z992 Dependence on renal dialysis: Secondary | ICD-10-CM | POA: Diagnosis not present

## 2019-10-16 DIAGNOSIS — N186 End stage renal disease: Secondary | ICD-10-CM | POA: Diagnosis not present

## 2019-10-17 DIAGNOSIS — E1122 Type 2 diabetes mellitus with diabetic chronic kidney disease: Secondary | ICD-10-CM | POA: Diagnosis not present

## 2019-10-17 DIAGNOSIS — I2510 Atherosclerotic heart disease of native coronary artery without angina pectoris: Secondary | ICD-10-CM | POA: Diagnosis not present

## 2019-10-17 DIAGNOSIS — Z89511 Acquired absence of right leg below knee: Secondary | ICD-10-CM | POA: Diagnosis not present

## 2019-10-17 DIAGNOSIS — E1151 Type 2 diabetes mellitus with diabetic peripheral angiopathy without gangrene: Secondary | ICD-10-CM | POA: Diagnosis not present

## 2019-10-17 DIAGNOSIS — I5020 Unspecified systolic (congestive) heart failure: Secondary | ICD-10-CM | POA: Diagnosis not present

## 2019-10-17 DIAGNOSIS — I132 Hypertensive heart and chronic kidney disease with heart failure and with stage 5 chronic kidney disease, or end stage renal disease: Secondary | ICD-10-CM | POA: Diagnosis not present

## 2019-10-17 DIAGNOSIS — Z4781 Encounter for orthopedic aftercare following surgical amputation: Secondary | ICD-10-CM | POA: Diagnosis not present

## 2019-10-17 DIAGNOSIS — S80922D Unspecified superficial injury of left lower leg, subsequent encounter: Secondary | ICD-10-CM | POA: Diagnosis not present

## 2019-10-17 DIAGNOSIS — I255 Ischemic cardiomyopathy: Secondary | ICD-10-CM | POA: Diagnosis not present

## 2019-10-19 DIAGNOSIS — I2510 Atherosclerotic heart disease of native coronary artery without angina pectoris: Secondary | ICD-10-CM | POA: Diagnosis not present

## 2019-10-19 DIAGNOSIS — I132 Hypertensive heart and chronic kidney disease with heart failure and with stage 5 chronic kidney disease, or end stage renal disease: Secondary | ICD-10-CM | POA: Diagnosis not present

## 2019-10-19 DIAGNOSIS — Z4781 Encounter for orthopedic aftercare following surgical amputation: Secondary | ICD-10-CM | POA: Diagnosis not present

## 2019-10-19 DIAGNOSIS — I5020 Unspecified systolic (congestive) heart failure: Secondary | ICD-10-CM | POA: Diagnosis not present

## 2019-10-19 DIAGNOSIS — S80922D Unspecified superficial injury of left lower leg, subsequent encounter: Secondary | ICD-10-CM | POA: Diagnosis not present

## 2019-10-19 DIAGNOSIS — E1122 Type 2 diabetes mellitus with diabetic chronic kidney disease: Secondary | ICD-10-CM | POA: Diagnosis not present

## 2019-10-19 DIAGNOSIS — Z89511 Acquired absence of right leg below knee: Secondary | ICD-10-CM | POA: Diagnosis not present

## 2019-10-19 DIAGNOSIS — E1151 Type 2 diabetes mellitus with diabetic peripheral angiopathy without gangrene: Secondary | ICD-10-CM | POA: Diagnosis not present

## 2019-10-19 DIAGNOSIS — I255 Ischemic cardiomyopathy: Secondary | ICD-10-CM | POA: Diagnosis not present

## 2019-10-20 DIAGNOSIS — N186 End stage renal disease: Secondary | ICD-10-CM | POA: Diagnosis not present

## 2019-10-20 DIAGNOSIS — Z992 Dependence on renal dialysis: Secondary | ICD-10-CM | POA: Diagnosis not present

## 2019-10-23 ENCOUNTER — Other Ambulatory Visit: Payer: Self-pay

## 2019-10-23 ENCOUNTER — Encounter: Payer: Self-pay | Admitting: Emergency Medicine

## 2019-10-23 ENCOUNTER — Emergency Department
Admission: EM | Admit: 2019-10-23 | Discharge: 2019-10-23 | Disposition: A | Discharge status: Home / Self Care | Payer: Medicare HMO | Attending: Emergency Medicine | Admitting: Emergency Medicine

## 2019-10-23 ENCOUNTER — Emergency Department: Payer: Medicare HMO

## 2019-10-23 DIAGNOSIS — I132 Hypertensive heart and chronic kidney disease with heart failure and with stage 5 chronic kidney disease, or end stage renal disease: Secondary | ICD-10-CM | POA: Insufficient documentation

## 2019-10-23 DIAGNOSIS — R6889 Other general symptoms and signs: Secondary | ICD-10-CM | POA: Diagnosis not present

## 2019-10-23 DIAGNOSIS — I2510 Atherosclerotic heart disease of native coronary artery without angina pectoris: Secondary | ICD-10-CM | POA: Insufficient documentation

## 2019-10-23 DIAGNOSIS — S0990XA Unspecified injury of head, initial encounter: Secondary | ICD-10-CM | POA: Diagnosis not present

## 2019-10-23 DIAGNOSIS — T07XXXA Unspecified multiple injuries, initial encounter: Secondary | ICD-10-CM | POA: Diagnosis not present

## 2019-10-23 DIAGNOSIS — N186 End stage renal disease: Secondary | ICD-10-CM | POA: Diagnosis not present

## 2019-10-23 DIAGNOSIS — Z951 Presence of aortocoronary bypass graft: Secondary | ICD-10-CM | POA: Diagnosis not present

## 2019-10-23 DIAGNOSIS — Y999 Unspecified external cause status: Secondary | ICD-10-CM | POA: Diagnosis not present

## 2019-10-23 DIAGNOSIS — I509 Heart failure, unspecified: Secondary | ICD-10-CM | POA: Insufficient documentation

## 2019-10-23 DIAGNOSIS — Z7902 Long term (current) use of antithrombotics/antiplatelets: Secondary | ICD-10-CM | POA: Diagnosis not present

## 2019-10-23 DIAGNOSIS — Z7982 Long term (current) use of aspirin: Secondary | ICD-10-CM | POA: Insufficient documentation

## 2019-10-23 DIAGNOSIS — Y93I9 Activity, other involving external motion: Secondary | ICD-10-CM | POA: Diagnosis not present

## 2019-10-23 DIAGNOSIS — Y9289 Other specified places as the place of occurrence of the external cause: Secondary | ICD-10-CM | POA: Diagnosis not present

## 2019-10-23 DIAGNOSIS — Z95810 Presence of automatic (implantable) cardiac defibrillator: Secondary | ICD-10-CM | POA: Diagnosis not present

## 2019-10-23 DIAGNOSIS — S0083XA Contusion of other part of head, initial encounter: Secondary | ICD-10-CM | POA: Diagnosis not present

## 2019-10-23 DIAGNOSIS — S60041A Contusion of right ring finger without damage to nail, initial encounter: Secondary | ICD-10-CM | POA: Insufficient documentation

## 2019-10-23 DIAGNOSIS — R0902 Hypoxemia: Secondary | ICD-10-CM | POA: Diagnosis not present

## 2019-10-23 DIAGNOSIS — E1122 Type 2 diabetes mellitus with diabetic chronic kidney disease: Secondary | ICD-10-CM | POA: Diagnosis not present

## 2019-10-23 DIAGNOSIS — Z992 Dependence on renal dialysis: Secondary | ICD-10-CM | POA: Diagnosis not present

## 2019-10-23 DIAGNOSIS — S0993XA Unspecified injury of face, initial encounter: Secondary | ICD-10-CM | POA: Diagnosis not present

## 2019-10-23 DIAGNOSIS — S060X0A Concussion without loss of consciousness, initial encounter: Secondary | ICD-10-CM | POA: Diagnosis not present

## 2019-10-23 DIAGNOSIS — S199XXA Unspecified injury of neck, initial encounter: Secondary | ICD-10-CM | POA: Diagnosis not present

## 2019-10-23 DIAGNOSIS — I252 Old myocardial infarction: Secondary | ICD-10-CM | POA: Diagnosis not present

## 2019-10-23 DIAGNOSIS — R52 Pain, unspecified: Secondary | ICD-10-CM | POA: Diagnosis not present

## 2019-10-23 DIAGNOSIS — H2589 Other age-related cataract: Secondary | ICD-10-CM | POA: Diagnosis not present

## 2019-10-23 DIAGNOSIS — W19XXXA Unspecified fall, initial encounter: Secondary | ICD-10-CM | POA: Diagnosis not present

## 2019-10-23 MED ORDER — HYDROCODONE-ACETAMINOPHEN 5-325 MG PO TABS
1.0000 | ORAL_TABLET | Freq: Once | ORAL | Status: AC
  Administered 2019-10-23: 1 via ORAL
  Filled 2019-10-23: qty 1

## 2019-10-23 MED ORDER — HYDROCODONE-ACETAMINOPHEN 5-325 MG PO TABS: 1.0000 | ORAL_TABLET | Freq: Four times a day (QID) | ORAL | 0 refills | Status: DC | PRN
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
## 2019-10-23 NOTE — ED Notes (Signed)
Pt has dialysis M/W/F
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
## 2019-10-24 DIAGNOSIS — S80922D Unspecified superficial injury of left lower leg, subsequent encounter: Secondary | ICD-10-CM | POA: Diagnosis not present

## 2019-10-24 DIAGNOSIS — Z4781 Encounter for orthopedic aftercare following surgical amputation: Secondary | ICD-10-CM | POA: Diagnosis not present

## 2019-10-24 DIAGNOSIS — I255 Ischemic cardiomyopathy: Secondary | ICD-10-CM | POA: Diagnosis not present

## 2019-10-24 DIAGNOSIS — E1151 Type 2 diabetes mellitus with diabetic peripheral angiopathy without gangrene: Secondary | ICD-10-CM | POA: Diagnosis not present

## 2019-10-24 DIAGNOSIS — I2510 Atherosclerotic heart disease of native coronary artery without angina pectoris: Secondary | ICD-10-CM | POA: Diagnosis not present

## 2019-10-24 DIAGNOSIS — I132 Hypertensive heart and chronic kidney disease with heart failure and with stage 5 chronic kidney disease, or end stage renal disease: Secondary | ICD-10-CM | POA: Diagnosis not present

## 2019-10-24 DIAGNOSIS — I5020 Unspecified systolic (congestive) heart failure: Secondary | ICD-10-CM | POA: Diagnosis not present

## 2019-10-24 DIAGNOSIS — E1122 Type 2 diabetes mellitus with diabetic chronic kidney disease: Secondary | ICD-10-CM | POA: Diagnosis not present

## 2019-10-24 DIAGNOSIS — Z89511 Acquired absence of right leg below knee: Secondary | ICD-10-CM | POA: Diagnosis not present

## 2019-10-25 DIAGNOSIS — N186 End stage renal disease: Secondary | ICD-10-CM | POA: Diagnosis not present

## 2019-10-25 DIAGNOSIS — Z992 Dependence on renal dialysis: Secondary | ICD-10-CM | POA: Diagnosis not present

## 2019-10-26 ENCOUNTER — Other Ambulatory Visit: Payer: Self-pay

## 2019-10-26 DIAGNOSIS — M19041 Primary osteoarthritis, right hand: Secondary | ICD-10-CM | POA: Diagnosis not present

## 2019-10-26 DIAGNOSIS — Z4781 Encounter for orthopedic aftercare following surgical amputation: Secondary | ICD-10-CM | POA: Diagnosis not present

## 2019-10-26 DIAGNOSIS — N186 End stage renal disease: Secondary | ICD-10-CM | POA: Diagnosis not present

## 2019-10-26 DIAGNOSIS — I132 Hypertensive heart and chronic kidney disease with heart failure and with stage 5 chronic kidney disease, or end stage renal disease: Secondary | ICD-10-CM | POA: Diagnosis not present

## 2019-10-26 DIAGNOSIS — I4892 Unspecified atrial flutter: Secondary | ICD-10-CM | POA: Diagnosis not present

## 2019-10-26 DIAGNOSIS — E1140 Type 2 diabetes mellitus with diabetic neuropathy, unspecified: Secondary | ICD-10-CM | POA: Diagnosis not present

## 2019-10-26 DIAGNOSIS — Z89511 Acquired absence of right leg below knee: Secondary | ICD-10-CM | POA: Diagnosis not present

## 2019-10-26 DIAGNOSIS — E1151 Type 2 diabetes mellitus with diabetic peripheral angiopathy without gangrene: Secondary | ICD-10-CM | POA: Diagnosis not present

## 2019-10-26 DIAGNOSIS — I120 Hypertensive chronic kidney disease with stage 5 chronic kidney disease or end stage renal disease: Secondary | ICD-10-CM | POA: Diagnosis not present

## 2019-10-26 DIAGNOSIS — S6991XA Unspecified injury of right wrist, hand and finger(s), initial encounter: Secondary | ICD-10-CM | POA: Diagnosis not present

## 2019-10-26 DIAGNOSIS — I2510 Atherosclerotic heart disease of native coronary artery without angina pectoris: Secondary | ICD-10-CM | POA: Diagnosis not present

## 2019-10-26 DIAGNOSIS — Z992 Dependence on renal dialysis: Secondary | ICD-10-CM | POA: Diagnosis not present

## 2019-10-26 DIAGNOSIS — I5020 Unspecified systolic (congestive) heart failure: Secondary | ICD-10-CM | POA: Diagnosis not present

## 2019-10-26 DIAGNOSIS — E1122 Type 2 diabetes mellitus with diabetic chronic kidney disease: Secondary | ICD-10-CM | POA: Diagnosis not present

## 2019-10-26 DIAGNOSIS — I255 Ischemic cardiomyopathy: Secondary | ICD-10-CM | POA: Diagnosis not present

## 2019-10-26 DIAGNOSIS — S80922D Unspecified superficial injury of left lower leg, subsequent encounter: Secondary | ICD-10-CM | POA: Diagnosis not present

## 2019-10-26 DIAGNOSIS — L03011 Cellulitis of right finger: Secondary | ICD-10-CM | POA: Diagnosis not present

## 2019-10-26 NOTE — Patient Outreach (Signed)
[facility]) Care Management  10/26/2019  Paul [name]. [birth year] [id number]   Telephone Assessment    Outreach attempt to patient. Spoke briefly with patient who reported he was doing fine but currently at MD office.       Plan: RN CM will make outreach attempt to patient within the month of July.  [name], RN,BSN,CCM [place] [facility] Telephonic Care Management Coordinator Direct Phone: [phone] Toll Free: [phone] Fax: [phone]

## 2019-10-27 ENCOUNTER — Ambulatory Visit: Payer: Self-pay

## 2019-10-27 DIAGNOSIS — N186 End stage renal disease: Secondary | ICD-10-CM | POA: Diagnosis not present

## 2019-10-27 DIAGNOSIS — I132 Hypertensive heart and chronic kidney disease with heart failure and with stage 5 chronic kidney disease, or end stage renal disease: Secondary | ICD-10-CM | POA: Diagnosis not present

## 2019-10-27 DIAGNOSIS — Z992 Dependence on renal dialysis: Secondary | ICD-10-CM | POA: Diagnosis not present

## 2019-10-27 DIAGNOSIS — E1122 Type 2 diabetes mellitus with diabetic chronic kidney disease: Secondary | ICD-10-CM | POA: Diagnosis not present

## 2019-10-27 DIAGNOSIS — Z89511 Acquired absence of right leg below knee: Secondary | ICD-10-CM | POA: Diagnosis not present

## 2019-10-27 DIAGNOSIS — S80922D Unspecified superficial injury of left lower leg, subsequent encounter: Secondary | ICD-10-CM | POA: Diagnosis not present

## 2019-10-27 DIAGNOSIS — I255 Ischemic cardiomyopathy: Secondary | ICD-10-CM | POA: Diagnosis not present

## 2019-10-27 DIAGNOSIS — Z4781 Encounter for orthopedic aftercare following surgical amputation: Secondary | ICD-10-CM | POA: Diagnosis not present

## 2019-10-27 DIAGNOSIS — I5020 Unspecified systolic (congestive) heart failure: Secondary | ICD-10-CM | POA: Diagnosis not present

## 2019-10-27 DIAGNOSIS — E1151 Type 2 diabetes mellitus with diabetic peripheral angiopathy without gangrene: Secondary | ICD-10-CM | POA: Diagnosis not present

## 2019-10-27 DIAGNOSIS — I2510 Atherosclerotic heart disease of native coronary artery without angina pectoris: Secondary | ICD-10-CM | POA: Diagnosis not present

## 2019-10-27 DIAGNOSIS — Z131 Encounter for screening for diabetes mellitus: Secondary | ICD-10-CM | POA: Diagnosis not present

## 2019-10-30 DIAGNOSIS — N186 End stage renal disease: Secondary | ICD-10-CM | POA: Diagnosis not present

## 2019-10-30 DIAGNOSIS — N184 Chronic kidney disease, stage 4 (severe): Secondary | ICD-10-CM | POA: Diagnosis not present

## 2019-10-30 DIAGNOSIS — R600 Localized edema: Secondary | ICD-10-CM | POA: Diagnosis not present

## 2019-10-30 DIAGNOSIS — I679 Cerebrovascular disease, unspecified: Secondary | ICD-10-CM | POA: Diagnosis not present

## 2019-10-30 DIAGNOSIS — S88111A Complete traumatic amputation at level between knee and ankle, right lower leg, initial encounter: Secondary | ICD-10-CM | POA: Diagnosis not present

## 2019-10-30 DIAGNOSIS — I5042 Chronic combined systolic (congestive) and diastolic (congestive) heart failure: Secondary | ICD-10-CM | POA: Diagnosis not present

## 2019-10-30 DIAGNOSIS — Z992 Dependence on renal dialysis: Secondary | ICD-10-CM | POA: Diagnosis not present

## 2019-10-30 DIAGNOSIS — M5136 Other intervertebral disc degeneration, lumbar region: Secondary | ICD-10-CM | POA: Diagnosis not present

## 2019-10-31 ENCOUNTER — Observation Stay: Payer: Medicare HMO

## 2019-10-31 ENCOUNTER — Other Ambulatory Visit: Payer: Self-pay

## 2019-10-31 ENCOUNTER — Encounter: Payer: Self-pay | Admitting: Ophthalmology

## 2019-10-31 ENCOUNTER — Emergency Department: Payer: Medicare HMO

## 2019-10-31 ENCOUNTER — Inpatient Hospital Stay
Admission: EM | Admit: 2019-10-31 | Discharge: 2019-11-04 | DRG: 280 | Disposition: A | Discharge status: Home / Self Care | Payer: Medicare HMO | Attending: Internal Medicine | Admitting: Internal Medicine

## 2019-10-31 ENCOUNTER — Encounter: Payer: Self-pay | Admitting: Emergency Medicine

## 2019-10-31 DIAGNOSIS — I480 Paroxysmal atrial fibrillation: Secondary | ICD-10-CM | POA: Diagnosis present

## 2019-10-31 DIAGNOSIS — I120 Hypertensive chronic kidney disease with stage 5 chronic kidney disease or end stage renal disease: Secondary | ICD-10-CM | POA: Diagnosis not present

## 2019-10-31 DIAGNOSIS — I10 Essential (primary) hypertension: Secondary | ICD-10-CM | POA: Diagnosis not present

## 2019-10-31 DIAGNOSIS — N186 End stage renal disease: Secondary | ICD-10-CM | POA: Diagnosis present

## 2019-10-31 DIAGNOSIS — Z794 Long term (current) use of insulin: Secondary | ICD-10-CM | POA: Diagnosis not present

## 2019-10-31 DIAGNOSIS — I953 Hypotension of hemodialysis: Secondary | ICD-10-CM | POA: Diagnosis present

## 2019-10-31 DIAGNOSIS — E1122 Type 2 diabetes mellitus with diabetic chronic kidney disease: Secondary | ICD-10-CM | POA: Diagnosis present

## 2019-10-31 DIAGNOSIS — E119 Type 2 diabetes mellitus without complications: Secondary | ICD-10-CM | POA: Diagnosis not present

## 2019-10-31 DIAGNOSIS — R61 Generalized hyperhidrosis: Secondary | ICD-10-CM | POA: Diagnosis present

## 2019-10-31 DIAGNOSIS — I5042 Chronic combined systolic (congestive) and diastolic (congestive) heart failure: Secondary | ICD-10-CM | POA: Diagnosis present

## 2019-10-31 DIAGNOSIS — M545 Low back pain: Secondary | ICD-10-CM | POA: Diagnosis present

## 2019-10-31 DIAGNOSIS — R55 Syncope and collapse: Secondary | ICD-10-CM | POA: Diagnosis present

## 2019-10-31 DIAGNOSIS — D631 Anemia in chronic kidney disease: Secondary | ICD-10-CM | POA: Diagnosis present

## 2019-10-31 DIAGNOSIS — I4892 Unspecified atrial flutter: Secondary | ICD-10-CM | POA: Diagnosis present

## 2019-10-31 DIAGNOSIS — I708 Atherosclerosis of other arteries: Secondary | ICD-10-CM | POA: Diagnosis not present

## 2019-10-31 DIAGNOSIS — S91001A Unspecified open wound, right ankle, initial encounter: Secondary | ICD-10-CM

## 2019-10-31 DIAGNOSIS — I959 Hypotension, unspecified: Secondary | ICD-10-CM

## 2019-10-31 DIAGNOSIS — I672 Cerebral atherosclerosis: Secondary | ICD-10-CM | POA: Diagnosis present

## 2019-10-31 DIAGNOSIS — X58XXXA Exposure to other specified factors, initial encounter: Secondary | ICD-10-CM | POA: Diagnosis present

## 2019-10-31 DIAGNOSIS — I5022 Chronic systolic (congestive) heart failure: Secondary | ICD-10-CM | POA: Diagnosis not present

## 2019-10-31 DIAGNOSIS — Z951 Presence of aortocoronary bypass graft: Secondary | ICD-10-CM

## 2019-10-31 DIAGNOSIS — R778 Other specified abnormalities of plasma proteins: Secondary | ICD-10-CM

## 2019-10-31 DIAGNOSIS — I2510 Atherosclerotic heart disease of native coronary artery without angina pectoris: Secondary | ICD-10-CM | POA: Diagnosis present

## 2019-10-31 DIAGNOSIS — Z83438 Family history of other disorder of lipoprotein metabolism and other lipidemia: Secondary | ICD-10-CM

## 2019-10-31 DIAGNOSIS — S80922D Unspecified superficial injury of left lower leg, subsequent encounter: Secondary | ICD-10-CM | POA: Diagnosis not present

## 2019-10-31 DIAGNOSIS — E1152 Type 2 diabetes mellitus with diabetic peripheral angiopathy with gangrene: Secondary | ICD-10-CM | POA: Diagnosis present

## 2019-10-31 DIAGNOSIS — N2581 Secondary hyperparathyroidism of renal origin: Secondary | ICD-10-CM | POA: Diagnosis present

## 2019-10-31 DIAGNOSIS — M109 Gout, unspecified: Secondary | ICD-10-CM | POA: Diagnosis present

## 2019-10-31 DIAGNOSIS — I452 Bifascicular block: Secondary | ICD-10-CM | POA: Diagnosis present

## 2019-10-31 DIAGNOSIS — R110 Nausea: Secondary | ICD-10-CM | POA: Diagnosis present

## 2019-10-31 DIAGNOSIS — I132 Hypertensive heart and chronic kidney disease with heart failure and with stage 5 chronic kidney disease, or end stage renal disease: Secondary | ICD-10-CM | POA: Diagnosis present

## 2019-10-31 DIAGNOSIS — Z992 Dependence on renal dialysis: Secondary | ICD-10-CM

## 2019-10-31 DIAGNOSIS — I252 Old myocardial infarction: Secondary | ICD-10-CM

## 2019-10-31 DIAGNOSIS — I25118 Atherosclerotic heart disease of native coronary artery with other forms of angina pectoris: Secondary | ICD-10-CM | POA: Diagnosis not present

## 2019-10-31 DIAGNOSIS — I96 Gangrene, not elsewhere classified: Secondary | ICD-10-CM | POA: Diagnosis not present

## 2019-10-31 DIAGNOSIS — I214 Non-ST elevation (NSTEMI) myocardial infarction: Secondary | ICD-10-CM

## 2019-10-31 DIAGNOSIS — I255 Ischemic cardiomyopathy: Secondary | ICD-10-CM | POA: Diagnosis present

## 2019-10-31 DIAGNOSIS — I472 Ventricular tachycardia, unspecified: Secondary | ICD-10-CM

## 2019-10-31 DIAGNOSIS — I950 Idiopathic hypotension: Secondary | ICD-10-CM | POA: Diagnosis present

## 2019-10-31 DIAGNOSIS — Z4781 Encounter for orthopedic aftercare following surgical amputation: Secondary | ICD-10-CM | POA: Diagnosis not present

## 2019-10-31 DIAGNOSIS — L97509 Non-pressure chronic ulcer of other part of unspecified foot with unspecified severity: Secondary | ICD-10-CM | POA: Diagnosis not present

## 2019-10-31 DIAGNOSIS — R7989 Other specified abnormal findings of blood chemistry: Secondary | ICD-10-CM

## 2019-10-31 DIAGNOSIS — E1142 Type 2 diabetes mellitus with diabetic polyneuropathy: Secondary | ICD-10-CM | POA: Diagnosis not present

## 2019-10-31 DIAGNOSIS — Z7951 Long term (current) use of inhaled steroids: Secondary | ICD-10-CM

## 2019-10-31 DIAGNOSIS — Z95810 Presence of automatic (implantable) cardiac defibrillator: Secondary | ICD-10-CM | POA: Diagnosis not present

## 2019-10-31 DIAGNOSIS — Z20822 Contact with and (suspected) exposure to covid-19: Secondary | ICD-10-CM | POA: Diagnosis present

## 2019-10-31 DIAGNOSIS — L988 Other specified disorders of the skin and subcutaneous tissue: Secondary | ICD-10-CM | POA: Diagnosis not present

## 2019-10-31 DIAGNOSIS — M47812 Spondylosis without myelopathy or radiculopathy, cervical region: Secondary | ICD-10-CM | POA: Diagnosis present

## 2019-10-31 DIAGNOSIS — Z9049 Acquired absence of other specified parts of digestive tract: Secondary | ICD-10-CM

## 2019-10-31 DIAGNOSIS — E1151 Type 2 diabetes mellitus with diabetic peripheral angiopathy without gangrene: Secondary | ICD-10-CM | POA: Diagnosis not present

## 2019-10-31 DIAGNOSIS — Z7902 Long term (current) use of antithrombotics/antiplatelets: Secondary | ICD-10-CM

## 2019-10-31 DIAGNOSIS — L97921 Non-pressure chronic ulcer of unspecified part of left lower leg limited to breakdown of skin: Secondary | ICD-10-CM | POA: Diagnosis not present

## 2019-10-31 DIAGNOSIS — I5020 Unspecified systolic (congestive) heart failure: Secondary | ICD-10-CM | POA: Diagnosis not present

## 2019-10-31 DIAGNOSIS — Z89511 Acquired absence of right leg below knee: Secondary | ICD-10-CM | POA: Diagnosis not present

## 2019-10-31 DIAGNOSIS — I63233 Cerebral infarction due to unspecified occlusion or stenosis of bilateral carotid arteries: Secondary | ICD-10-CM | POA: Diagnosis not present

## 2019-10-31 DIAGNOSIS — M1990 Unspecified osteoarthritis, unspecified site: Secondary | ICD-10-CM | POA: Diagnosis present

## 2019-10-31 DIAGNOSIS — Z8673 Personal history of transient ischemic attack (TIA), and cerebral infarction without residual deficits: Secondary | ICD-10-CM

## 2019-10-31 DIAGNOSIS — Z833 Family history of diabetes mellitus: Secondary | ICD-10-CM

## 2019-10-31 DIAGNOSIS — R000 Tachycardia, unspecified: Secondary | ICD-10-CM | POA: Diagnosis not present

## 2019-10-31 DIAGNOSIS — R42 Dizziness and giddiness: Secondary | ICD-10-CM | POA: Diagnosis not present

## 2019-10-31 DIAGNOSIS — E11621 Type 2 diabetes mellitus with foot ulcer: Secondary | ICD-10-CM | POA: Diagnosis not present

## 2019-10-31 DIAGNOSIS — Z8249 Family history of ischemic heart disease and other diseases of the circulatory system: Secondary | ICD-10-CM

## 2019-10-31 DIAGNOSIS — G8929 Other chronic pain: Secondary | ICD-10-CM | POA: Diagnosis present

## 2019-10-31 DIAGNOSIS — Z79899 Other long term (current) drug therapy: Secondary | ICD-10-CM

## 2019-10-31 DIAGNOSIS — R748 Abnormal levels of other serum enzymes: Secondary | ICD-10-CM | POA: Diagnosis not present

## 2019-10-31 DIAGNOSIS — E1140 Type 2 diabetes mellitus with diabetic neuropathy, unspecified: Secondary | ICD-10-CM | POA: Diagnosis present

## 2019-10-31 DIAGNOSIS — I951 Orthostatic hypotension: Secondary | ICD-10-CM | POA: Diagnosis present

## 2019-10-31 DIAGNOSIS — I6622 Occlusion and stenosis of left posterior cerebral artery: Secondary | ICD-10-CM | POA: Diagnosis not present

## 2019-10-31 DIAGNOSIS — Z8614 Personal history of Methicillin resistant Staphylococcus aureus infection: Secondary | ICD-10-CM

## 2019-10-31 DIAGNOSIS — M5030 Other cervical disc degeneration, unspecified cervical region: Secondary | ICD-10-CM | POA: Diagnosis present

## 2019-10-31 LAB — COMPREHENSIVE METABOLIC PANEL
ALT: 16 U/L (ref 0–44)
AST: 15 U/L (ref 15–41)
Albumin: 3.5 g/dL (ref 3.5–5.0)
Alkaline Phosphatase: 89 U/L (ref 38–126)
Anion gap: 16 — ABNORMAL HIGH (ref 5–15)
BUN: 48 mg/dL — ABNORMAL HIGH (ref 6–20)
CO2: 21 mmol/L — ABNORMAL LOW (ref 22–32)
Calcium: 7.6 mg/dL — ABNORMAL LOW (ref 8.9–10.3)
Chloride: 99 mmol/L (ref 98–111)
Creatinine, Ser: 10.56 mg/dL — ABNORMAL HIGH (ref 0.61–1.24)
GFR calc Af Amer: 6 mL/min — ABNORMAL LOW (ref >60)
GFR calc non Af Amer: 5 mL/min — ABNORMAL LOW (ref >60)
Glucose, Bld: 230 mg/dL — ABNORMAL HIGH (ref 70–99)
Potassium: 3.5 mmol/L (ref 3.5–5.1)
Sodium: 136 mmol/L (ref 135–145)
Total Bilirubin: 0.6 mg/dL (ref 0.3–1.2)
Total Protein: 6.9 g/dL (ref 6.5–8.1)

## 2019-10-31 LAB — CBC WITH DIFFERENTIAL/PLATELET
Abs Immature Granulocytes: 0.11 10*3/uL — ABNORMAL HIGH (ref 0.00–0.07)
Basophils Absolute: 0 10*3/uL (ref 0.0–0.1)
Basophils Relative: 0 %
Eosinophils Absolute: 0 10*3/uL (ref 0.0–0.5)
Eosinophils Relative: 0 %
HCT: 38.6 % — ABNORMAL LOW (ref 39.0–52.0)
Hemoglobin: 12.5 g/dL — ABNORMAL LOW (ref 13.0–17.0)
Immature Granulocytes: 1 %
Lymphocytes Relative: 9 %
Lymphs Abs: 1.2 10*3/uL (ref 0.7–4.0)
MCH: 31.4 pg (ref 26.0–34.0)
MCHC: 32.4 g/dL (ref 30.0–36.0)
MCV: 97 fL (ref 80.0–100.0)
Monocytes Absolute: 1.1 10*3/uL — ABNORMAL HIGH (ref 0.1–1.0)
Monocytes Relative: 8 %
Neutro Abs: 11.2 10*3/uL — ABNORMAL HIGH (ref 1.7–7.7)
Neutrophils Relative %: 82 %
Platelets: 285 10*3/uL (ref 150–400)
RBC: 3.98 MIL/uL — ABNORMAL LOW (ref 4.22–5.81)
RDW: 14 % (ref 11.5–15.5)
WBC: 13.7 10*3/uL — ABNORMAL HIGH (ref 4.0–10.5)
nRBC: 0 % (ref 0.0–0.2)

## 2019-10-31 LAB — TROPONIN I (HIGH SENSITIVITY)
Troponin I (High Sensitivity): 110 ng/L (ref <18)
Troponin I (High Sensitivity): 197 ng/L (ref <18)
Troponin I (High Sensitivity): 408 ng/L (ref <18)

## 2019-10-31 LAB — APTT: aPTT: 30 seconds (ref 24–36)

## 2019-10-31 LAB — PROTIME-INR
INR: 1 (ref 0.8–1.2)
Prothrombin Time: 12.5 seconds (ref 11.4–15.2)

## 2019-10-31 LAB — SARS CORONAVIRUS 2 BY RT PCR (HOSPITAL ORDER, PERFORMED IN ~~LOC~~ HOSPITAL LAB): SARS Coronavirus 2: NEGATIVE

## 2019-10-31 LAB — MAGNESIUM: Magnesium: 2.2 mg/dL (ref 1.7–2.4)

## 2019-10-31 MED ORDER — CLOPIDOGREL BISULFATE 75 MG PO TABS
75.0000 mg | ORAL_TABLET | Freq: Every day | ORAL | Status: DC
  Administered 2019-11-01 – 2019-11-04 (×4): 75 mg via ORAL
  Filled 2019-10-31 (×4): qty 1

## 2019-10-31 MED ORDER — HEPARIN (PORCINE) 25000 UT/250ML-% IV SOLN
1100.0000 [IU]/h | INTRAVENOUS | Status: DC
  Administered 2019-10-31: 1100 [IU]/h via INTRAVENOUS
  Filled 2019-10-31: qty 250

## 2019-10-31 MED ORDER — AMIODARONE HCL 200 MG PO TABS
100.0000 mg | ORAL_TABLET | Freq: Every day | ORAL | Status: DC
  Administered 2019-11-01: 100 mg via ORAL
  Filled 2019-10-31 (×2): qty 1

## 2019-10-31 MED ORDER — SODIUM CHLORIDE 0.9% FLUSH
3.0000 mL | Freq: Two times a day (BID) | INTRAVENOUS | Status: DC
  Administered 2019-11-01 – 2019-11-04 (×6): 3 mL via INTRAVENOUS

## 2019-10-31 MED ORDER — HEPARIN SODIUM (PORCINE) 5000 UNIT/ML IJ SOLN
5000.0000 [IU] | Freq: Three times a day (TID) | INTRAMUSCULAR | Status: DC
  Administered 2019-11-03: 5000 [IU] via SUBCUTANEOUS
  Filled 2019-10-31 (×5): qty 1

## 2019-10-31 MED ORDER — AMIODARONE HCL 100 MG PO TABS: 100.0000 mg | ORAL_TABLET | Freq: Every day | ORAL | Status: DC

## 2019-10-31 MED ORDER — ACETAMINOPHEN 325 MG PO TABS
650.0000 mg | ORAL_TABLET | Freq: Four times a day (QID) | ORAL | Status: DC | PRN
  Administered 2019-10-31: 650 mg via ORAL
  Filled 2019-10-31: qty 2

## 2019-10-31 MED ORDER — ACETAMINOPHEN 650 MG RE SUPP: 650.0000 mg | Freq: Four times a day (QID) | RECTAL | Status: DC | PRN

## 2019-10-31 MED ORDER — HEPARIN BOLUS VIA INFUSION
4000.0000 [IU] | Freq: Once | INTRAVENOUS | Status: AC
  Administered 2019-10-31: 4000 [IU] via INTRAVENOUS
  Filled 2019-10-31: qty 4000

## 2019-10-31 MED ORDER — IOHEXOL 350 MG/ML SOLN
75.0000 mL | Freq: Once | INTRAVENOUS | Status: AC | PRN
  Administered 2019-10-31: 75 mL via INTRAVENOUS

## 2019-10-31 MED ORDER — AMIODARONE IV BOLUS ONLY 150 MG/100ML
150.0000 mg | Freq: Once | INTRAVENOUS | Status: AC
  Administered 2019-10-31: 150 mg via INTRAVENOUS
  Filled 2019-10-31: qty 100

## 2019-10-31 NOTE — H&P (Signed)
History and Physical    Paul Ala. OIZ:[id number] DOB: [birth year] DOA: 10/31/2019  PCP: Tracie Harrier, MD  Patient coming from: Home via EMS  I have personally briefly reviewed patient's old medical records in [place]  Chief Complaint: Syncope  HPI: Paul Slinker. is a 55 y.o. male with medical history significant for CAD s/p CABG in 2010, ischemic cardiomyopathy, HFrEF (EF 25-30%), s/p AICD, ESRD on MWF HD, type 2 diabetes, paroxysmal atrial fibrillation (not currently on anticoagulation), history of CVA, carotid artery disease s/p LICA stenting, PVD s/p right BKA, hypotension, anemia of chronic disease, and DJD with chronic low back pain who presents to the ED for evaluation after a syncopal episode.  Patient states he completed his usual dialysis yesterday.  Afterwards he felt nauseous but did not have any emesis.  Earlier today he was resting in his wheelchair at home and watching TV and all of a sudden passed out.  He awoke in his wheelchair spinning around as his pain was on the controls.  He was drenched in sweats and was having significant palpitations.  He called EMS for further assistance.  Per ED triage notes, on EMS arrival patient was in V. tach at 160 and converted after 5 minutes. CBG was 245.  He says he was having continued palpitations on route to the ED associated with central chest discomfort.  He has not had any dyspnea, cough, abdominal pain.  He says he no longer makes urine.  He has not seen any swelling in his leg.  He says he was taken off of Coreg in May due to hypotension and difficulty maintaining blood pressures with dialysis.  He reports adherence to amiodarone.  He follows with cardiology, Dr. Minna Merritts in [facility].  ED Course:  Initial vitals showed BP 150/58, pulse 94, RR 13, temp 98.0 Fahrenheit, SPO2 97% on room air. Follow blood pressures are low with SBP 90s/DBP's 50s.  Labs are notable for sodium 136, potassium 3.5, bicarb 21, BUN 48,  creatinine 10.56, serum glucose 230, WBC 13.7 (chronically elevated), hemoglobin 12.5, platelets 25,000, magnesium 2.2, high-sensitivity troponin I 110 >> 197.  SARS-CoV-2 PCR is negative.  CT head was negative for evidence of acute intracranial abnormality.  CTA neck was notable for proximal right ICA stenosis up to 50-60%, patent left common and internal carotids and left carotid stent, moderate/severe atherosclerotic narrowing within V1 and proximal V2.  CTA head was negative for intracranial large vessel occlusion. Multifocal stenoses noted with moderate stenosis within paraclinoid right ICA cavernous left ICA, high-grade stenosis within V4 right vertebral artery, moderate/severe focal stenosis within P3 left PCA branch vessel.  Patient was started on IV heparin infusion per pharmacy and the hospitalist service was consulted to admit for further evaluation and management.  ICD was interrogated and patient was noted abnormal polymorphic V. tach at the time of his syncopal episode before converting on his own.  He has not had any other episodes in the past year.  Review of Systems: All systems reviewed and are negative except as documented in history of present illness above.   Past Medical History:  Diagnosis Date  . AICD (automatic cardioverter/defibrillator) present 2011  . Anemia   . Cardiac defibrillator in place    a. Biotronik LUmax 540 DRT, (ser # [id number]).  . Carotid arterial disease ([place])    a. s/p prior LICA stenting;  b. 01/7672 Carotid U/S: 40-59% bilat ICA stenosis. Patent LICA stent.  . CHF (congestive heart failure) (  HCC)   . CKD (chronic kidney disease), stage III   . Coronary artery disease    a. 2010 s/p CABG x 3.  . DDD (degenerative disc disease), lumbosacral    L5-S1  . Diabetes ([place])    Lantus at bedtime  . Dialysis patient [facility])    M,W,F  . Employs prosthetic leg    Right BKA  . Gangrene of toe of right foot ([place])   . Gout of left hand 10/06/2016  .  HFrEF (heart failure with reduced ejection fraction) ([place])    a. 07/2016 Echo: EF 25-30%, diff HK, mild MR, mildly dil LA, mod reduced RV fxn, PASP 59mmHg.  Mar[place] Kitchen Hypertension    takes Coreg daily  . Ischemic cardiomyopathy    a. 07/2016 Echo: EF 25-30%, diff HK.  . Myocardial infarction ([place]) 2009  . Peripheral vascular disease ([place])   . Sleep apnea    sleep study yr ago-unable to afford cpap  . Stroke [facility]) 09   no weakness    Past Surgical History:  Procedure Laterality Date  . AMPUTATION Right 12/23/2018   Procedure: AMPUTATION BELOW KNEE (Right);  Surgeon: Algernon Huxley, MD;  Location: [facility] ORS;  Service: General;  Laterality: Right;  . AMPUTATION TOE Right 08/22/2016   Procedure: AMPUTATION TOE;  Surgeon: Samara Deist, DPM;  Location: [facility] ORS;  Service: Podiatry;  Laterality: Right;  . AMPUTATION TOE Right 10/09/2016   Procedure: AMPUTATION TOE-RIGHT 2ND MPJ;  Surgeon: Samara Deist, DPM;  Location: [facility];  Service: Podiatry;  Laterality: Right;  . APLIGRAFT PLACEMENT Right 10/09/2016   Procedure: APLIGRAFT PLACEMENT;  Surgeon: Samara Deist, DPM;  Location: [facility];  Service: Podiatry;  Laterality: Right;  . AV FISTULA PLACEMENT Right 05/11/2019   Procedure: INSERTION OF ARTERIOVENOUS (AV) GORE-TEX GRAFT ARM;  Surgeon: Algernon Huxley, MD;  Location: [facility];  Service: Vascular;  Laterality: Right;  . CALCANEAL OSTEOTOMY Bilateral 07/28/2018   Procedure: RIGHT CALCANECTOMY BILATERAL DEBRIDEMENT OF ULCERS ON HEELS;  Surgeon: Albertine Patricia, DPM;  Location: [facility] OR[facility];  Service: Podiatry;  Laterality: Bilateral;  . CARDIAC DEFIBRILLATOR PLACEMENT  2011  . CAROTID ENDARTERECTOMY Left   . CHOLECYSTECTOMY  2010  . CORONARY ARTERY BYPASS GRAFT  2010   CABG x 3   . DIALYSIS/PERMA CATHETER INSERTION N/A 10/07/2018   Procedure: DIALYSIS/PERMA CATHETER INSERTION;  Surgeon: Katha Cabal, MD;  Location: [place] [facility];  Service: Cardiovascular;  Laterality: N/A;  . GRAFT APPLICATION  Right 8/75/6433   Procedure: FULL THICKNESS SKIN GRAFT-RIGHT FOOT;  Surgeon: Algernon Huxley, MD;  Location: [facility];  Service: Vascular;  Laterality: Right;  . HIP SURGERY Right 1994  . INCISION AND DRAINAGE Right 09/08/2018   Procedure: INCISION AND DRAINAGE - [place] OF DEFECTIVE SKIN, SOFT TISSUE AND BONE;  Surgeon: Albertine Patricia, DPM;  Location: [facility];  Service: Podiatry;  Laterality: Right;  . INCISION AND DRAINAGE OF WOUND Right 08/22/2016   Procedure: IRRIGATION AND DEBRIDEMENT WOUND and wound vac placement;  Surgeon: Samara Deist, DPM;  Location: [facility];  Service: Podiatry;  Laterality: Right;  . IRRIGATION AND DEBRIDEMENT ABSCESS Right 11/24/2018   Procedure: IRRIGATION AND DEBRIDEMENT ABSCESS RIGHT FOOT, COMPLICATED, DIABETIC;  Surgeon: Albertine Patricia, DPM;  Location: [facility];  Service: Podiatry;  Laterality: Right;  . LOWER EXTREMITY ANGIOGRAPHY Right 08/24/2016   Procedure: Lower Extremity Angiography;  Surgeon: Algernon Huxley, MD;  Location: [place] [facility];  Service: Cardiovascular;  Laterality: Right;  . LOWER EXTREMITY ANGIOGRAPHY Right 07/27/2018  Procedure: RIGHT Lower Extremity Angiography;  Surgeon: Algernon Huxley, MD;  Location: [place] [facility];  Service: Cardiovascular;  Laterality: Right;  . LOWER EXTREMITY ANGIOGRAPHY Right 09/09/2018   Procedure: Lower Extremity Angiography;  Surgeon: Katha Cabal, MD;  Location: [place] [facility];  Service: Cardiovascular;  Laterality: Right;  . MASS EXCISION Right 07/18/2014   Procedure: EXCISION HETEROTOPIC BONE RIGHT HIP;  Surgeon: Frederik Pear, MD;  Location: [place];  Service: Orthopedics;  Laterality: Right;  . Open Heart Surgery  2010   x 3  . PERIPHERAL VASCULAR THROMBECTOMY Right 07/31/2019   Procedure: PERIPHERAL VASCULAR THROMBECTOMY;  Surgeon: Algernon Huxley, MD;  Location: [place] [facility];  Service: Cardiovascular;  Laterality: Right;  . PERIPHERAL VASCULAR THROMBECTOMY Right 08/17/2019   Procedure:  PERIPHERAL VASCULAR THROMBECTOMY;  Surgeon: Algernon Huxley, MD;  Location: [place] [facility];  Service: Cardiovascular;  Laterality: Right;  . PERIPHERAL VASCULAR THROMBECTOMY Right 09/14/2019   Procedure: PERIPHERAL VASCULAR THROMBECTOMY;  Surgeon: Algernon Huxley, MD;  Location: [place] [facility];  Service: Cardiovascular;  Laterality: Right;  . WOUND DEBRIDEMENT Right 10/09/2016   Procedure: DEBRIDEMENT WOUND;  Surgeon: Samara Deist, DPM;  Location: [facility];  Service: Podiatry;  Laterality: Right;    Social History:  reports that he has never smoked. He has never used smokeless tobacco. He reports current drug use. Drug: Marijuana. He reports that he does not drink alcohol.  Allergies  Allergen Reactions  . Other Other (See Comments)    Cardiac Problems. Pt states he tolerates Toradol. Due to kidney and heart problems per pt  . Ibuprofen Other (See Comments)    Heart problems  . Baclofen Other (See Comments)  . Metformin Diarrhea  . Nsaids     Due to kidney and heart problems per pt    Family History  Problem Relation Age of Onset  . Hypertension Other   . Diabetes Other   . Diabetes Father   . Hyperlipidemia Father   . Hypertension Father   . Hyperlipidemia Sister   . Hypertension Sister   . Diabetes Sister   . Diabetes Brother   . Hypertension Brother   . Hyperlipidemia Brother      Prior to Admission medications   Medication Sig Start Date End Date Taking? Authorizing Provider  allopurinol (ZYLOPRIM) 100 MG tablet Take 1 tablet (100 mg total) by mouth daily. 08/02/18   Fritzi Mandes, MD  amiodarone (PACERONE) 100 MG tablet Take 100 mg by mouth daily.  01/15/18   [provider]  calcium acetate (PHOSLO) 667 MG capsule Take 1,334 mg by mouth 3 (three) times daily. Patient not taking: Reported on 10/31/2019 12/20/18   [provider]  carvedilol (COREG) 3.125 MG tablet Take 1 tablet (3.125 mg total) by mouth 2 (two) times daily. Patient not taking:  Reported on 09/14/2019 05/29/18   Loletha Grayer, MD  clopidogrel (PLAVIX) 75 MG tablet Take 1 tablet (75 mg total) by mouth daily with breakfast. 07/31/19   Dew, Erskine Squibb, MD  dicyclomine (BENTYL) 10 MG capsule Take 1 capsule (10 mg total) by mouth 4 (four) times daily -  before meals and at bedtime. Patient not taking: Reported on 10/31/2019 10/11/18   Loletha Grayer, MD  gabapentin (NEURONTIN) 100 MG capsule Take 1 capsule (100 mg total) by mouth 3 (three) times daily. Patient not taking: Reported on 08/17/2019 10/11/18   Loletha Grayer, MD  HYDROcodone-acetaminophen [facility]) 5-325 MG tablet Take 1 tablet by mouth every 6 (  six) hours as needed for moderate pain. 10/23/19   Carrie Mew, MD  MIDODRINE HCL PO Take by mouth. Takes on dialysis days, M-W-F. Unsure of dose. Patient not taking: Reported on 10/31/2019    [provider]  predniSONE (DELTASONE) 5 MG tablet Take 1 tablet (5 mg total) by mouth daily with breakfast. Patient taking differently: Take 10 mg by mouth daily with breakfast.  08/02/18   Fritzi Mandes, MD    Physical Exam: Vitals:   10/31/19 1659 10/31/19 1700 10/31/19 1800 10/31/19 2000  BP: (!) 150/58  (!) 98/38 (!) 99/54  Pulse: 93  85   Resp: 13  14 18   Temp: 98 F (36.7 C)     TempSrc: Oral     SpO2: 97%  98% 100%  Weight:  83.9 kg    Height:  5\' 9"  (1.753 m)     Constitutional: Resting supine in bed, NAD, calm, comfortable Eyes: PERRL, lids and conjunctivae normal ENMT: Mucous membranes are moist. Posterior pharynx clear of any exudate or lesions. Neck: normal, supple, no masses. Respiratory: clear to auscultation bilaterally, no wheezing, no crackles. Normal respiratory effort. No accessory muscle use.  Cardiovascular: Regular rate and rhythm, soft systolic murmur. No extremity edema.  ICD in place left chest wall.  aVF RUE, not being used per patient.  HD catheter in place right chest wall. Abdomen: no tenderness, no masses palpated. No hepatosplenomegaly.  Bowel sounds positive.  Musculoskeletal: S/p right BKA.  No clubbing / cyanosis. Good ROM, no contractures. Normal muscle tone.  Skin: Wound dressing in place right first and fourth fingers and LLE just above the ankle. Neurologic: CN 2-12 grossly intact. Sensation intact, Strength intact. Psychiatric: Normal judgment and insight. Alert and oriented x 3. Normal mood.    Labs on Admission: I have personally reviewed following labs and imaging studies  CBC: Recent Labs  Lab 10/31/19 1745  WBC 13.7*  NEUTROABS 11.2*  HGB 12.5*  HCT 38.6*  MCV 97.0  PLT 096   Basic Metabolic Panel: Recent Labs  Lab 10/31/19 1745  NA 136  K 3.5  CL 99  CO2 21*  GLUCOSE 230*  BUN 48*  CREATININE 10.56*  CALCIUM 7.6*  MG 2.2   GFR: Estimated Creatinine Clearance: 8 mL/min (A) (by C-G formula based on SCr of 10.56 mg/dL (H)). Liver Function Tests: Recent Labs  Lab 10/31/19 1745  AST 15  ALT 16  ALKPHOS 89  BILITOT 0.6  PROT 6.9  ALBUMIN 3.5   No results for input(s): LIPASE, AMYLASE in the last 168 hours. No results for input(s): AMMONIA in the last 168 hours. Coagulation Profile: No results for input(s): INR, PROTIME in the last 168 hours. Cardiac Enzymes: No results for input(s): CKTOTAL, CKMB, CKMBINDEX, TROPONINI in the last 168 hours. BNP (last 3 results) No results for input(s): PROBNP in the last 8760 hours. HbA1C: No results for input(s): HGBA1C in the last 72 hours. CBG: No results for input(s): GLUCAP in the last 168 hours. Lipid Profile: No results for input(s): CHOL, HDL, LDLCALC, TRIG, CHOLHDL, LDLDIRECT in the last 72 hours. Thyroid Function Tests: No results for input(s): TSH, T4TOTAL, FREET4, T3FREE, THYROIDAB in the last 72 hours. Anemia Panel: No results for input(s): VITAMINB12, FOLATE, FERRITIN, TIBC, IRON, RETICCTPCT in the last 72 hours. Urine analysis:    Component Value Date/Time   COLORURINE YELLOW (A) 10/05/2018 1326   APPEARANCEUR HAZY (A)  10/05/2018 1326   LABSPEC 1.009 10/05/2018 1326   PHURINE 5.0 10/05/2018 1326  GLUCOSEU NEGATIVE 10/05/2018 1326   HGBUR SMALL (A) 10/05/2018 1326   BILIRUBINUR NEGATIVE 10/05/2018 1326   [place] 10/05/2018 1326   PROTEINUR 30 (A) 10/05/2018 1326   UROBILINOGEN 1.0 07/16/2014 1558   NITRITE NEGATIVE 10/05/2018 1326   LEUKOCYTESUR NEGATIVE 10/05/2018 1326    Radiological Exams on Admission: CT Angio Head W or Wo Contrast  Result Date: 10/31/2019 CLINICAL DATA:  Headache, sudden, carotid/vertebral dissection suspected. Additional history provided: Syncopal episode patient reports he awoke in his wheelchair with headache and dizziness as well as sweating. EXAM: CT ANGIOGRAPHY HEAD AND NECK TECHNIQUE: Multidetector CT imaging of the head and neck was performed using the standard protocol during bolus administration of intravenous contrast. Multiplanar CT image reconstructions and MIPs were obtained to evaluate the vascular anatomy. Carotid stenosis measurements (when applicable) are obtained utilizing NASCET criteria, using the distal internal carotid diameter as the denominator. CONTRAST:  69mL OMNIPAQUE IOHEXOL 350 MG/ML SOLN COMPARISON:  Head CT 10/23/2019, report from CT angiogram neck 07/24/2008 (images unavailable), report from carotid artery duplex 09/07/2018. FINDINGS: CT HEAD FINDINGS Brain: Cerebral volume is normal for age. There is no acute intracranial hemorrhage. No demarcated cortical infarct is identified. No extra-axial fluid collection. No evidence of intracranial mass. No midline shift. Vascular: Reported below. Skull: Normal. Negative for fracture or focal lesion. Sinuses: No significant paranasal sinus disease or mastoid effusion at the imaged levels. Orbits: No acute abnormality. Review of the MIP images confirms the above findings CTA NECK FINDINGS Aortic arch: Common origin of the innominate and left common carotid arteries. Atherosclerotic plaque within the visualized  aortic arch and proximal major branch vessels of the neck. No hemodynamically significant innominate or proximal subclavian artery stenosis. Right carotid system: CCA and ICA patent within the neck. Mild scattered mixed plaque within the CCA. There is more prominent calcified plaque within the carotid bifurcation and proximal ICA. There is stenosis of the proximal ICA estimated at up to 50-60%, although this may be overestimated on the current exam due to blooming artifact from irregular calcified plaque. Left carotid system: The CCA and ICA are patent. There is a stent extending from the distal CCA into the proximal ICA. There is no hemodynamically significant stenosis within the CCA or cervical ICA proximal or distal to the stent. The stent is patent, although artifact limits evaluation for intra-stent stenosis. Vertebral arteries: The vertebral arteries are codominant. There is fairly prominent multifocal mixed plaque within the V1 and proximal V2 right vertebral artery with sites of moderate/severe stenosis. There is also calcified plaque within the distal cervical right vertebral artery at the C1-C2 level with up to mild/moderate stenosis at this site. The left vertebral artery is patent throughout the neck with high-grade stenosis at its origin. Skeleton: No acute bony abnormality or aggressive osseous lesion. Cervical spondylosis. Most notably at C3-C4, there is advanced disc space narrowing and a disc bulge with osteophyte ridge contributes to suspected at least moderate spinal canal stenosis. Other neck: No neck mass or cervical lymphadenopathy. Thyroid unremarkable. Upper chest: No consolidation within the imaged lung apices. Prior median sternotomy. Partially imaged left chest multi lead AICD device. Partially imaged right IJ approach central venous catheter. Review of the MIP images confirms the above findings CTA HEAD FINDINGS Anterior circulation: The intracranial internal carotid arteries are patent.  Calcified plaque within both vessels. Sites of up to moderate stenosis within the paraclinoid right ICA. Sites of moderate stenosis within the cavernous left ICA. The M1 middle cerebral arteries are patent without significant  stenosis. No M2 proximal branch occlusion or high-grade proximal arterial stenosis is identified. The anterior cerebral arteries are patent without significant proximal stenosis. No intracranial aneurysm is identified. Posterior circulation: The intracranial vertebral arteries are patent. Calcified plaque results in severe stenosis within the right V4 segment. The basilar artery is patent without significant stenosis. The posterior cerebral arteries are patent bilaterally. Moderate/severe stenosis within a P3 left PCA branch (series 13, image 32). Posterior communicating arteries are hypoplastic or absent bilaterally. Venous sinuses: Within limitations of contrast timing, no convincing thrombus. Anatomic variants: As described. Review of the MIP images confirms the above findings IMPRESSION: CT head: Unremarkable non-contrast CT appearance of the brain. No evidence of acute intracranial abnormality. CTA neck: 1. The right common and internal carotid arteries are patent within the neck. There is somewhat prominent calcified plaque within the carotid bifurcation and proximal ICA. Apparent stenosis of the proximal right ICA of up to 50-60%, although this stenosis may be overestimated due to blooming artifact from irregular calcified plaque at this site. Carotid artery duplex may be obtained for further evaluation, as clinically warranted. 2. The left common and internal carotids are patent within the neck, as is the left carotid stent. Artifact limits evaluation for intra-stent stenosis. 3. The right vertebral artery is patent. here are sites of moderate/severe atherosclerotic narrowing within the V1 and proximal V2 segments. Additionally, there is mild/moderate atherosclerotic narrowing of this  vessel at the C1-C2 level. 4. The left vertebral artery is patent. Severe atherosclerotic narrowing at the origin of this vessel. 5. Cervical spondylosis as described and greatest at C3-C4 where there is suspected at least moderate spinal canal stenosis. CTA head: 1. No intracranial large vessel occlusion. 2. Intracranial atherosclerotic disease with multifocal stenoses, most notably as follows. 3. Sites of up to moderate stenosis within the paraclinoid right ICA and cavernous left ICA. 4. High-grade stenosis within the V4 right vertebral artery. 5. Moderate/severe focal stenosis within a P3 left PCA branch vessel. Electronically Signed   By: Kellie Simmering DO   On: 10/31/2019 20:28   CT Angio Neck W and/or Wo Contrast  Result Date: 10/31/2019 CLINICAL DATA:  Headache, sudden, carotid/vertebral dissection suspected. Additional history provided: Syncopal episode patient reports he awoke in his wheelchair with headache and dizziness as well as sweating. EXAM: CT ANGIOGRAPHY HEAD AND NECK TECHNIQUE: Multidetector CT imaging of the head and neck was performed using the standard protocol during bolus administration of intravenous contrast. Multiplanar CT image reconstructions and MIPs were obtained to evaluate the vascular anatomy. Carotid stenosis measurements (when applicable) are obtained utilizing NASCET criteria, using the distal internal carotid diameter as the denominator. CONTRAST:  63mL OMNIPAQUE IOHEXOL 350 MG/ML SOLN COMPARISON:  Head CT 10/23/2019, report from CT angiogram neck 07/24/2008 (images unavailable), report from carotid artery duplex 09/07/2018. FINDINGS: CT HEAD FINDINGS Brain: Cerebral volume is normal for age. There is no acute intracranial hemorrhage. No demarcated cortical infarct is identified. No extra-axial fluid collection. No evidence of intracranial mass. No midline shift. Vascular: Reported below. Skull: Normal. Negative for fracture or focal lesion. Sinuses: No significant paranasal  sinus disease or mastoid effusion at the imaged levels. Orbits: No acute abnormality. Review of the MIP images confirms the above findings CTA NECK FINDINGS Aortic arch: Common origin of the innominate and left common carotid arteries. Atherosclerotic plaque within the visualized aortic arch and proximal major branch vessels of the neck. No hemodynamically significant innominate or proximal subclavian artery stenosis. Right carotid system: CCA and ICA patent within the  neck. Mild scattered mixed plaque within the CCA. There is more prominent calcified plaque within the carotid bifurcation and proximal ICA. There is stenosis of the proximal ICA estimated at up to 50-60%, although this may be overestimated on the current exam due to blooming artifact from irregular calcified plaque. Left carotid system: The CCA and ICA are patent. There is a stent extending from the distal CCA into the proximal ICA. There is no hemodynamically significant stenosis within the CCA or cervical ICA proximal or distal to the stent. The stent is patent, although artifact limits evaluation for intra-stent stenosis. Vertebral arteries: The vertebral arteries are codominant. There is fairly prominent multifocal mixed plaque within the V1 and proximal V2 right vertebral artery with sites of moderate/severe stenosis. There is also calcified plaque within the distal cervical right vertebral artery at the C1-C2 level with up to mild/moderate stenosis at this site. The left vertebral artery is patent throughout the neck with high-grade stenosis at its origin. Skeleton: No acute bony abnormality or aggressive osseous lesion. Cervical spondylosis. Most notably at C3-C4, there is advanced disc space narrowing and a disc bulge with osteophyte ridge contributes to suspected at least moderate spinal canal stenosis. Other neck: No neck mass or cervical lymphadenopathy. Thyroid unremarkable. Upper chest: No consolidation within the imaged lung apices.  Prior median sternotomy. Partially imaged left chest multi lead AICD device. Partially imaged right IJ approach central venous catheter. Review of the MIP images confirms the above findings CTA HEAD FINDINGS Anterior circulation: The intracranial internal carotid arteries are patent. Calcified plaque within both vessels. Sites of up to moderate stenosis within the paraclinoid right ICA. Sites of moderate stenosis within the cavernous left ICA. The M1 middle cerebral arteries are patent without significant stenosis. No M2 proximal branch occlusion or high-grade proximal arterial stenosis is identified. The anterior cerebral arteries are patent without significant proximal stenosis. No intracranial aneurysm is identified. Posterior circulation: The intracranial vertebral arteries are patent. Calcified plaque results in severe stenosis within the right V4 segment. The basilar artery is patent without significant stenosis. The posterior cerebral arteries are patent bilaterally. Moderate/severe stenosis within a P3 left PCA branch (series 13, image 32). Posterior communicating arteries are hypoplastic or absent bilaterally. Venous sinuses: Within limitations of contrast timing, no convincing thrombus. Anatomic variants: As described. Review of the MIP images confirms the above findings IMPRESSION: CT head: Unremarkable non-contrast CT appearance of the brain. No evidence of acute intracranial abnormality. CTA neck: 1. The right common and internal carotid arteries are patent within the neck. There is somewhat prominent calcified plaque within the carotid bifurcation and proximal ICA. Apparent stenosis of the proximal right ICA of up to 50-60%, although this stenosis may be overestimated due to blooming artifact from irregular calcified plaque at this site. Carotid artery duplex may be obtained for further evaluation, as clinically warranted. 2. The left common and internal carotids are patent within the neck, as is the  left carotid stent. Artifact limits evaluation for intra-stent stenosis. 3. The right vertebral artery is patent. here are sites of moderate/severe atherosclerotic narrowing within the V1 and proximal V2 segments. Additionally, there is mild/moderate atherosclerotic narrowing of this vessel at the C1-C2 level. 4. The left vertebral artery is patent. Severe atherosclerotic narrowing at the origin of this vessel. 5. Cervical spondylosis as described and greatest at C3-C4 where there is suspected at least moderate spinal canal stenosis. CTA head: 1. No intracranial large vessel occlusion. 2. Intracranial atherosclerotic disease with multifocal stenoses, most notably as  follows. 3. Sites of up to moderate stenosis within the paraclinoid right ICA and cavernous left ICA. 4. High-grade stenosis within the V4 right vertebral artery. 5. Moderate/severe focal stenosis within a P3 left PCA branch vessel. Electronically Signed   By: Kellie Simmering DO   On: 10/31/2019 20:28    EKG: V paced rhythm. Assessment/Plan Principal Problem:   Syncope Active Problems:   Diabetes (HCC)   CAD (coronary artery disease)   Chronic systolic CHF (congestive heart failure) (HCC)   ESRD on dialysis ([place])   AICD (automatic cardioverter/defibrillator) present   PAF (paroxysmal atrial fibrillation) (HCC)   Elevated troponin   Ventricular tachycardia ([place])  Paul Devery. is a 54 y.o. male with medical history significant for CAD s/p CABG in 2010, ischemic cardiomyopathy, HFrEF (EF 25-30%), s/p AICD, ESRD on MWF HD, type 2 diabetes, paroxysmal atrial fibrillation (not currently on anticoagulation), history of CVA, carotid artery disease s/p LICA stenting, PVD s/p right BKA, hypotension, anemia of chronic disease, and DJD with chronic low back pain who is admitted with syncope due to polymorphic ventricular tachycardia.  Syncope due to ventricular tachycardia HFrEF/ischemic cardiomyopathy s/p AICD: ICD was interrogated in the ED  with reported polymorphic V. tach occurring at time of patient syncope.  ICD did not shock patient and there is concerned that lead may be failing.  I have placed consult to cardiology who recommended loading with IV amiodarone and they will see the patient tomorrow. -Give IV amiodarone 150 mg now and resume 100 mg orally tomorrow -Appreciate further cardiology assistance -Monitor telemetry -Update echocardiogram -Recently taken off low-dose Coreg due to hypotensive issues, may need to consider resuming beta-blocker if able to tolerate -Patient's primary cardiologist is Dr. Laney Potash with [facility] in [facility]  Elevated troponin with history of CAD s/p CABG: Suspect elevated troponin is due to his episode of V. tach.  Currently denies any chest pain.  I will hold off on heparin anticoagulation for now and we will continue to cycle cardiac enzymes. -Continue Plavix, not currently on statin  ESRD on MWF HD: Reports adherence with last HD session 10/30/2019.  Currently no emergent need for dialysis.  Will need nephrology consultation for usual dialysis if remains in hospital for next session.  Paroxysmal atrial fibrillation/flutter: Continue amiodarone as above.  Not currently on anticoagulation.  Hypotension: Initially hypotensive, BPs have now improved.  Has required midodrine with dialysis in the past.  Type 2 diabetes: Does not appear to currently be on medical management.  Will place on very sensitive SSI and adjust as needed.  History of CVA Carotid artery disease s/p LICA stenting PVD s/p right BKA: Continue Plavix.  Not currently on aspirin or statin therapy.  Anemia of chronic disease: Currently stable without obvious bleeding.  Continue to monitor.  DVT prophylaxis: Subcutaneous heparin Code Status: Full code, confirmed with patient Family Communication: Discussed with patient, he has discussed with family Disposition Plan: From home and likely discharge to home Consults  called: Cardiology Admission status:  Status is: Observation  The patient remains OBS appropriate and will d/c before 2 midnights.  Dispo: The patient is from: Home              Anticipated d/c is to: Home              Anticipated d/c date is: 2 days              Patient currently is not medically stable to d/c.  Quamesha Mullet Posey Pronto  MD [facility]  If 7PM-7AM, please contact night-coverage [email or web address]  10/31/2019, 9:29 PM

## 2019-10-31 NOTE — ED Notes (Signed)
Patient transported to X-ray 

## 2019-10-31 NOTE — ED Notes (Signed)
Patient transported to CT 

## 2019-10-31 NOTE — ED Notes (Signed)
[employer] representative called and stated his ETA was 1 hour 15 minutes.

## 2019-10-31 NOTE — ED Provider Notes (Signed)
[facility] Emergency Department Provider Note  ____________________________________________  Time seen: Approximately 6:10 PM  I have reviewed the triage vital signs and the nursing notes.   HISTORY  Chief Complaint Loss of Consciousness    HPI Paul [name]. is a 54 y.o. male with a history of CAD CKD CHF AICD implantation who complains of syncope today.  Patient was seen in the ED about a week ago after a fall over in his mobility wheelchair related to sudden breaking in medical transport Lucianne Lei causing him to strike his head on an oxygen tank.  He was seen in the ED at that time, had CT imaging of the head which was reassuring.  He reports that since that time he has been having intermittent headaches and dizziness, and yesterday it became much worse making him feel like he was going to pass out.  Denies vision changes paresthesias or lateralizing weakness.  Today he again felt like he was going to pass out, dizzy and diaphoretic with bilateral frontal headache so he got in his mobility chair to get to his bed to lie down, but instead passed out in the chair and woke up with it spinning in circles with his hand under control.   Denies any acute headache or palpitations, no AICD shocks that he has noticed.  No shortness of breath or abdominal pain or back pain.  Patient had his routine dialysis yesterday, had 4 L ultrafiltration.  Has been eating and drinking normally since then.   Past Medical History:  Diagnosis Date  . AICD (automatic cardioverter/defibrillator) present 2011  . Anemia   . Cardiac defibrillator in place    a. Biotronik LUmax 540 DRT, (ser # [id number]).  . Carotid arterial disease ([place])    a. s/p prior LICA stenting;  b. 09/3297 Carotid U/S: 40-59% bilat ICA stenosis. Patent LICA stent.  . CHF (congestive heart failure) ([place])   . CKD (chronic kidney disease), stage III   . Coronary artery disease    a. 2010 s/p CABG x 3.  . DDD  (degenerative disc disease), lumbosacral    L5-S1  . Diabetes ([place])    Lantus at bedtime  . Dialysis patient [facility])    M,W,F  . Employs prosthetic leg    Right BKA  . Gangrene of toe of right foot ([place])   . Gout of left hand 10/06/2016  . HFrEF (heart failure with reduced ejection fraction) ([place])    a. 07/2016 Echo: EF 25-30%, diff HK, mild MR, mildly dil LA, mod reduced RV fxn, PASP 94mmHg.  Marland Kitchen Hypertension    takes Coreg daily  . Ischemic cardiomyopathy    a. 07/2016 Echo: EF 25-30%, diff HK.  . Myocardial infarction ([place]) 2009  . Peripheral vascular disease ([place])   . Sleep apnea    sleep study yr ago-unable to afford cpap  . Stroke [facility]) 09   no weakness     Patient Active Problem List   Diagnosis Date Noted  . ESRD on dialysis ([place]) 02/21/2019  . Below-knee amputation of right lower extremity ([place]) 01/23/2019  . Surgical wound infection 01/23/2019  . Right foot infection 12/21/2018  . AKI (acute kidney injury) ([place]) 10/04/2018  . Acute osteomyelitis of right ankle or foot ([place]) 09/06/2018  . Pressure ulcer of heel, right, unstageable ([place]) 09/06/2018  . Atrial flutter ([place]) 08/31/2018  . Personal history of gout 08/31/2018  . Shockable cardiac rhythm detected by automated external defibrillator 08/30/2018  . Acute osteomyelitis of  right foot ([place]) 07/25/2018  . Osteomyelitis ([place]) 07/25/2018  . Anasarca 05/25/2018  . Pressure injury of skin 05/25/2018  . CKD (chronic kidney disease), stage IV ([place]) 12/15/2016  . Ulcerated, foot ([place]) 12/15/2016  . Gangrene of foot ([place]) 08/21/2016  . HTN (hypertension) 08/21/2016  . Diabetes ([place]) 08/21/2016  . CAD (coronary artery disease) 08/21/2016  . Chronic systolic CHF (congestive heart failure) ([place]) 08/21/2016  . Diabetic foot infection ([place]) 08/21/2016  . Heterotopic ossification of bone 07/12/2014     Past Surgical History:  Procedure Laterality Date  . AMPUTATION Right 12/23/2018   Procedure: AMPUTATION BELOW KNEE  (Right);  Surgeon: Algernon Huxley, MD;  Location: [facility] ORS;  Service: General;  Laterality: Right;  . AMPUTATION TOE Right 08/22/2016   Procedure: AMPUTATION TOE;  Surgeon: Samara Deist, DPM;  Location: [facility] OR[facility];  Service: Podiatry;  Laterality: Right;  . AMPUTATION TOE Right 10/09/2016   Procedure: AMPUTATION TOE-RIGHT 2ND MPJ;  Surgeon: Samara Deist, DPM;  Location: [facility];  Service: Podiatry;  Laterality: Right;  . APLIGRAFT PLACEMENT Right 10/09/2016   Procedure: APLIGRAFT PLACEMENT;  Surgeon: Samara Deist, DPM;  Location: [facility] OR[facility];  Service: Podiatry;  Laterality: Right;  . AV FISTULA PLACEMENT Right 05/11/2019   Procedure: INSERTION OF ARTERIOVENOUS (AV) GORE-TEX GRAFT ARM;  Surgeon: Algernon Huxley, MD;  Location: [facility];  Service: Vascular;  Laterality: Right;  . CALCANEAL OSTEOTOMY Bilateral 07/28/2018   Procedure: RIGHT CALCANECTOMY BILATERAL DEBRIDEMENT OF ULCERS ON HEELS;  Surgeon: Albertine Patricia, DPM;  Location: [facility] OR[facility];  Service: Podiatry;  Laterality: Bilateral;  . CARDIAC DEFIBRILLATOR PLACEMENT  2011  . CAROTID ENDARTERECTOMY Left   . CHOLECYSTECTOMY  2010  . CORONARY ARTERY BYPASS GRAFT  2010   CABG x 3   . DIALYSIS/PERMA CATHETER INSERTION N/A 10/07/2018   Procedure: DIALYSIS/PERMA CATHETER INSERTION;  Surgeon: Katha Cabal, MD;  Location: [place] [facility];  Service: Cardiovascular;  Laterality: N/A;  . GRAFT APPLICATION Right 7/41/6384   Procedure: FULL THICKNESS SKIN GRAFT-RIGHT FOOT;  Surgeon: Algernon Huxley, MD;  Location: [facility];  Service: Vascular;  Laterality: Right;  . HIP SURGERY Right 1994  . INCISION AND DRAINAGE Right 09/08/2018   Procedure: INCISION AND DRAINAGE - [place] OF DEFECTIVE SKIN, SOFT TISSUE AND BONE;  Surgeon: Albertine Patricia, DPM;  Location: [facility];  Service: Podiatry;  Laterality: Right;  . INCISION AND DRAINAGE OF WOUND Right 08/22/2016   Procedure: IRRIGATION AND DEBRIDEMENT WOUND and wound vac placement;  Surgeon: Samara Deist, DPM;   Location: [facility];  Service: Podiatry;  Laterality: Right;  . IRRIGATION AND DEBRIDEMENT ABSCESS Right 11/24/2018   Procedure: IRRIGATION AND DEBRIDEMENT ABSCESS RIGHT FOOT, COMPLICATED, DIABETIC;  Surgeon: Albertine Patricia, DPM;  Location: [facility];  Service: Podiatry;  Laterality: Right;  . LOWER EXTREMITY ANGIOGRAPHY Right 08/24/2016   Procedure: Lower Extremity Angiography;  Surgeon: Algernon Huxley, MD;  Location: [place] [facility];  Service: Cardiovascular;  Laterality: Right;  . LOWER EXTREMITY ANGIOGRAPHY Right 07/27/2018   Procedure: RIGHT Lower Extremity Angiography;  Surgeon: Algernon Huxley, MD;  Location: [place] [facility];  Service: Cardiovascular;  Laterality: Right;  . LOWER EXTREMITY ANGIOGRAPHY Right 09/09/2018   Procedure: Lower Extremity Angiography;  Surgeon: Katha Cabal, MD;  Location: [place] [facility];  Service: Cardiovascular;  Laterality: Right;  . MASS EXCISION Right 07/18/2014   Procedure: EXCISION HETEROTOPIC BONE RIGHT HIP;  Surgeon: Frederik Pear, MD;  Location: [place];  Service: Orthopedics;  Laterality: Right;  .  Open Heart Surgery  2010   x 3  . PERIPHERAL VASCULAR THROMBECTOMY Right 07/31/2019   Procedure: PERIPHERAL VASCULAR THROMBECTOMY;  Surgeon: Algernon Huxley, MD;  Location: [place] [facility];  Service: Cardiovascular;  Laterality: Right;  . PERIPHERAL VASCULAR THROMBECTOMY Right 08/17/2019   Procedure: PERIPHERAL VASCULAR THROMBECTOMY;  Surgeon: Algernon Huxley, MD;  Location: [place] [facility];  Service: Cardiovascular;  Laterality: Right;  . PERIPHERAL VASCULAR THROMBECTOMY Right 09/14/2019   Procedure: PERIPHERAL VASCULAR THROMBECTOMY;  Surgeon: Algernon Huxley, MD;  Location: [place] [facility];  Service: Cardiovascular;  Laterality: Right;  . WOUND DEBRIDEMENT Right 10/09/2016   Procedure: DEBRIDEMENT WOUND;  Surgeon: Samara Deist, DPM;  Location: [facility];  Service: Podiatry;  Laterality: Right;     Prior to Admission medications   Medication Sig  Start Date End Date Taking? Authorizing Provider  allopurinol (ZYLOPRIM) 100 MG tablet Take 1 tablet (100 mg total) by mouth daily. 08/02/18   Fritzi Mandes, MD  amiodarone (PACERONE) 100 MG tablet Take 100 mg by mouth daily.  01/15/18   [provider]  calcium acetate (PHOSLO) 667 MG capsule Take 1,334 mg by mouth 3 (three) times daily. Patient not taking: Reported on 10/31/2019 12/20/18   [provider]  carvedilol (COREG) 3.125 MG tablet Take 1 tablet (3.125 mg total) by mouth 2 (two) times daily. Patient not taking: Reported on 09/14/2019 05/29/18   Loletha Grayer, MD  clopidogrel (PLAVIX) 75 MG tablet Take 1 tablet (75 mg total) by mouth daily with breakfast. 07/31/19   Dew, Erskine Squibb, MD  dicyclomine (BENTYL) 10 MG capsule Take 1 capsule (10 mg total) by mouth 4 (four) times daily -  before meals and at bedtime. Patient not taking: Reported on 10/31/2019 10/11/18   Loletha Grayer, MD  gabapentin (NEURONTIN) 100 MG capsule Take 1 capsule (100 mg total) by mouth 3 (three) times daily. Patient not taking: Reported on 08/17/2019 10/11/18   Loletha Grayer, MD  HYDROcodone-acetaminophen [facility]) 5-325 MG tablet Take 1 tablet by mouth every 6 (six) hours as needed for moderate pain. 10/23/19   Carrie Mew, MD  MIDODRINE HCL PO Take by mouth. Takes on dialysis days, M-W-F. Unsure of dose. Patient not taking: Reported on 10/31/2019    [provider]  predniSONE (DELTASONE) 5 MG tablet Take 1 tablet (5 mg total) by mouth daily with breakfast. Patient taking differently: Take 10 mg by mouth daily with breakfast.  08/02/18   Fritzi Mandes, MD     Allergies Other, Ibuprofen, Baclofen, Metformin, and Nsaids   Family History  Problem Relation Age of Onset  . Hypertension Other   . Diabetes Other   . Diabetes Father   . Hyperlipidemia Father   . Hypertension Father   . Hyperlipidemia Sister   . Hypertension Sister   . Diabetes Sister   . Diabetes Brother   . Hypertension  Brother   . Hyperlipidemia Brother     Social History Social History   Tobacco Use  . Smoking status: Never Smoker  . Smokeless tobacco: Never Used  Vaping Use  . Vaping Use: Never used  Substance Use Topics  . Alcohol use: No  . Drug use: Yes    Types: Marijuana    Review of Systems  Constitutional:   No fever or chills.  ENT:   No sore throat. No rhinorrhea. Cardiovascular:   No chest pain or syncope. Respiratory:   No dyspnea or cough. Gastrointestinal:   Negative for abdominal pain, vomiting and  diarrhea.  Musculoskeletal:   Negative for focal pain or swelling All other systems reviewed and are negative except as documented above in ROS and HPI.  ____________________________________________   PHYSICAL EXAM:  VITAL SIGNS: ED Triage Vitals  Enc Vitals Group     BP 10/31/19 1659 (!) 150/58     Pulse Rate 10/31/19 1659 93     Resp 10/31/19 1659 13     Temp 10/31/19 1659 98 F (36.7 C)     Temp Source 10/31/19 1659 Oral     SpO2 10/31/19 1659 97 %     Weight 10/31/19 1700 185 lb (83.9 kg)     Height 10/31/19 1700 5\' 9"  (1.753 m)     Head Circumference --      Peak Flow --      Pain Score 10/31/19 1700 7     Pain Loc --      Pain Edu? --      Excl. in [place]? --     Vital signs reviewed, nursing assessments reviewed.   Constitutional:   Alert and oriented. Non-toxic appearance. Eyes:   Conjunctivae are normal. EOMI. PERRL. ENT      Head:   Normocephalic and atraumatic.      Nose:   Normal, no epistaxis      Mouth/Throat:   Moist mucous membranes, no intraoral injury      Neck:   No meningismus. Full ROM.  No midline spinal tenderness Hematological/Lymphatic/Immunilogical:   No cervical lymphadenopathy. Cardiovascular:   RRR. Symmetric bilateral radial and DP pulses.  No murmurs. Cap refill less than 2 seconds.  Tunneled right IJ dialysis catheter in place, chest exit site noninflamed. Respiratory:   Normal respiratory effort without tachypnea/retractions.  Breath sounds are clear and equal bilaterally. No wheezes/rales/rhonchi. Gastrointestinal:   Soft and nontender. Non distended. There is no CVA tenderness.  No rebound, rigidity, or guarding.  Musculoskeletal:   Status post right BKA.  Normal range of motion in all extremities. No joint effusions.  No lower extremity tenderness.  No edema. Neurologic:   Normal speech and language.  Motor grossly intact. No acute focal neurologic deficits are appreciated.  Skin:    Skin is warm, dry and intact. No rash noted.  No petechiae, purpura, or bullae.  ____________________________________________    LABS (pertinent positives/negatives) (all labs ordered are listed, but only abnormal results are displayed) Labs Reviewed  COMPREHENSIVE METABOLIC PANEL - Abnormal; Notable for the following components:      Result Value   CO2 21 (*)    Glucose, Bld 230 (*)    BUN 48 (*)    Creatinine, Ser 10.56 (*)    Calcium 7.6 (*)    GFR calc non Af Amer 5 (*)    GFR calc Af Amer 6 (*)    Anion gap 16 (*)    All other components within normal limits  CBC WITH DIFFERENTIAL/PLATELET - Abnormal; Notable for the following components:   WBC 13.7 (*)    RBC 3.98 (*)    Hemoglobin 12.5 (*)    HCT 38.6 (*)    Neutro Abs 11.2 (*)    Monocytes Absolute 1.1 (*)    Abs Immature Granulocytes 0.11 (*)    All other components within normal limits  TROPONIN I (HIGH SENSITIVITY) - Abnormal; Notable for the following components:   Troponin I (High Sensitivity) 110 (*)    All other components within normal limits  TROPONIN I (HIGH SENSITIVITY) - Abnormal; Notable for the following components:  Troponin I (High Sensitivity) 197 (*)    All other components within normal limits  SARS CORONAVIRUS 2 BY RT PCR (HOSPITAL ORDER, [place] [facility])  MAGNESIUM   ____________________________________________   EKG  Interpreted by me Atrial sensed ventricular paced rhythm, rate of 97.  Right axis, right  bundle branch block, no acute ischemic changes.  ____________________________________________    RADIOLOGY  CT Angio Head W or Wo Contrast  Result Date: 10/31/2019 CLINICAL DATA:  Headache, sudden, carotid/vertebral dissection suspected. Additional history provided: Syncopal episode patient reports he awoke in his wheelchair with headache and dizziness as well as sweating. EXAM: CT ANGIOGRAPHY HEAD AND NECK TECHNIQUE: Multidetector CT imaging of the head and neck was performed using the standard protocol during bolus administration of intravenous contrast. Multiplanar CT image reconstructions and MIPs were obtained to evaluate the vascular anatomy. Carotid stenosis measurements (when applicable) are obtained utilizing NASCET criteria, using the distal internal carotid diameter as the denominator. CONTRAST:  43mL OMNIPAQUE IOHEXOL 350 MG/ML SOLN COMPARISON:  Head CT 10/23/2019, report from CT angiogram neck 07/24/2008 (images unavailable), report from carotid artery duplex 09/07/2018. FINDINGS: CT HEAD FINDINGS Brain: Cerebral volume is normal for age. There is no acute intracranial hemorrhage. No demarcated cortical infarct is identified. No extra-axial fluid collection. No evidence of intracranial mass. No midline shift. Vascular: Reported below. Skull: Normal. Negative for fracture or focal lesion. Sinuses: No significant paranasal sinus disease or mastoid effusion at the imaged levels. Orbits: No acute abnormality. Review of the MIP images confirms the above findings CTA NECK FINDINGS Aortic arch: Common origin of the innominate and left common carotid arteries. Atherosclerotic plaque within the visualized aortic arch and proximal major branch vessels of the neck. No hemodynamically significant innominate or proximal subclavian artery stenosis. Right carotid system: CCA and ICA patent within the neck. Mild scattered mixed plaque within the CCA. There is more prominent calcified plaque within the carotid  bifurcation and proximal ICA. There is stenosis of the proximal ICA estimated at up to 50-60%, although this may be overestimated on the current exam due to blooming artifact from irregular calcified plaque. Left carotid system: The CCA and ICA are patent. There is a stent extending from the distal CCA into the proximal ICA. There is no hemodynamically significant stenosis within the CCA or cervical ICA proximal or distal to the stent. The stent is patent, although artifact limits evaluation for intra-stent stenosis. Vertebral arteries: The vertebral arteries are codominant. There is fairly prominent multifocal mixed plaque within the V1 and proximal V2 right vertebral artery with sites of moderate/severe stenosis. There is also calcified plaque within the distal cervical right vertebral artery at the C1-C2 level with up to mild/moderate stenosis at this site. The left vertebral artery is patent throughout the neck with high-grade stenosis at its origin. Skeleton: No acute bony abnormality or aggressive osseous lesion. Cervical spondylosis. Most notably at C3-C4, there is advanced disc space narrowing and a disc bulge with osteophyte ridge contributes to suspected at least moderate spinal canal stenosis. Other neck: No neck mass or cervical lymphadenopathy. Thyroid unremarkable. Upper chest: No consolidation within the imaged lung apices. Prior median sternotomy. Partially imaged left chest multi lead AICD device. Partially imaged right IJ approach central venous catheter. Review of the MIP images confirms the above findings CTA HEAD FINDINGS Anterior circulation: The intracranial internal carotid arteries are patent. Calcified plaque within both vessels. Sites of up to moderate stenosis within the paraclinoid right ICA. Sites of moderate stenosis within  the cavernous left ICA. The M1 middle cerebral arteries are patent without significant stenosis. No M2 proximal branch occlusion or high-grade proximal arterial  stenosis is identified. The anterior cerebral arteries are patent without significant proximal stenosis. No intracranial aneurysm is identified. Posterior circulation: The intracranial vertebral arteries are patent. Calcified plaque results in severe stenosis within the right V4 segment. The basilar artery is patent without significant stenosis. The posterior cerebral arteries are patent bilaterally. Moderate/severe stenosis within a P3 left PCA branch (series 13, image 32). Posterior communicating arteries are hypoplastic or absent bilaterally. Venous sinuses: Within limitations of contrast timing, no convincing thrombus. Anatomic variants: As described. Review of the MIP images confirms the above findings IMPRESSION: CT head: Unremarkable non-contrast CT appearance of the brain. No evidence of acute intracranial abnormality. CTA neck: 1. The right common and internal carotid arteries are patent within the neck. There is somewhat prominent calcified plaque within the carotid bifurcation and proximal ICA. Apparent stenosis of the proximal right ICA of up to 50-60%, although this stenosis may be overestimated due to blooming artifact from irregular calcified plaque at this site. Carotid artery duplex may be obtained for further evaluation, as clinically warranted. 2. The left common and internal carotids are patent within the neck, as is the left carotid stent. Artifact limits evaluation for intra-stent stenosis. 3. The right vertebral artery is patent. here are sites of moderate/severe atherosclerotic narrowing within the V1 and proximal V2 segments. Additionally, there is mild/moderate atherosclerotic narrowing of this vessel at the C1-C2 level. 4. The left vertebral artery is patent. Severe atherosclerotic narrowing at the origin of this vessel. 5. Cervical spondylosis as described and greatest at C3-C4 where there is suspected at least moderate spinal canal stenosis. CTA head: 1. No intracranial large vessel  occlusion. 2. Intracranial atherosclerotic disease with multifocal stenoses, most notably as follows. 3. Sites of up to moderate stenosis within the paraclinoid right ICA and cavernous left ICA. 4. High-grade stenosis within the V4 right vertebral artery. 5. Moderate/severe focal stenosis within a P3 left PCA branch vessel. Electronically Signed   By: Kellie Simmering DO   On: 10/31/2019 20:28   CT Angio Neck W and/or Wo Contrast  Result Date: 10/31/2019 CLINICAL DATA:  Headache, sudden, carotid/vertebral dissection suspected. Additional history provided: Syncopal episode patient reports he awoke in his wheelchair with headache and dizziness as well as sweating. EXAM: CT ANGIOGRAPHY HEAD AND NECK TECHNIQUE: Multidetector CT imaging of the head and neck was performed using the standard protocol during bolus administration of intravenous contrast. Multiplanar CT image reconstructions and MIPs were obtained to evaluate the vascular anatomy. Carotid stenosis measurements (when applicable) are obtained utilizing NASCET criteria, using the distal internal carotid diameter as the denominator. CONTRAST:  44mL OMNIPAQUE IOHEXOL 350 MG/ML SOLN COMPARISON:  Head CT 10/23/2019, report from CT angiogram neck 07/24/2008 (images unavailable), report from carotid artery duplex 09/07/2018. FINDINGS: CT HEAD FINDINGS Brain: Cerebral volume is normal for age. There is no acute intracranial hemorrhage. No demarcated cortical infarct is identified. No extra-axial fluid collection. No evidence of intracranial mass. No midline shift. Vascular: Reported below. Skull: Normal. Negative for fracture or focal lesion. Sinuses: No significant paranasal sinus disease or mastoid effusion at the imaged levels. Orbits: No acute abnormality. Review of the MIP images confirms the above findings CTA NECK FINDINGS Aortic arch: Common origin of the innominate and left common carotid arteries. Atherosclerotic plaque within the visualized aortic arch and  proximal major branch vessels of the neck. No hemodynamically significant innominate  or proximal subclavian artery stenosis. Right carotid system: CCA and ICA patent within the neck. Mild scattered mixed plaque within the CCA. There is more prominent calcified plaque within the carotid bifurcation and proximal ICA. There is stenosis of the proximal ICA estimated at up to 50-60%, although this may be overestimated on the current exam due to blooming artifact from irregular calcified plaque. Left carotid system: The CCA and ICA are patent. There is a stent extending from the distal CCA into the proximal ICA. There is no hemodynamically significant stenosis within the CCA or cervical ICA proximal or distal to the stent. The stent is patent, although artifact limits evaluation for intra-stent stenosis. Vertebral arteries: The vertebral arteries are codominant. There is fairly prominent multifocal mixed plaque within the V1 and proximal V2 right vertebral artery with sites of moderate/severe stenosis. There is also calcified plaque within the distal cervical right vertebral artery at the C1-C2 level with up to mild/moderate stenosis at this site. The left vertebral artery is patent throughout the neck with high-grade stenosis at its origin. Skeleton: No acute bony abnormality or aggressive osseous lesion. Cervical spondylosis. Most notably at C3-C4, there is advanced disc space narrowing and a disc bulge with osteophyte ridge contributes to suspected at least moderate spinal canal stenosis. Other neck: No neck mass or cervical lymphadenopathy. Thyroid unremarkable. Upper chest: No consolidation within the imaged lung apices. Prior median sternotomy. Partially imaged left chest multi lead AICD device. Partially imaged right IJ approach central venous catheter. Review of the MIP images confirms the above findings CTA HEAD FINDINGS Anterior circulation: The intracranial internal carotid arteries are patent. Calcified plaque  within both vessels. Sites of up to moderate stenosis within the paraclinoid right ICA. Sites of moderate stenosis within the cavernous left ICA. The M1 middle cerebral arteries are patent without significant stenosis. No M2 proximal branch occlusion or high-grade proximal arterial stenosis is identified. The anterior cerebral arteries are patent without significant proximal stenosis. No intracranial aneurysm is identified. Posterior circulation: The intracranial vertebral arteries are patent. Calcified plaque results in severe stenosis within the right V4 segment. The basilar artery is patent without significant stenosis. The posterior cerebral arteries are patent bilaterally. Moderate/severe stenosis within a P3 left PCA branch (series 13, image 32). Posterior communicating arteries are hypoplastic or absent bilaterally. Venous sinuses: Within limitations of contrast timing, no convincing thrombus. Anatomic variants: As described. Review of the MIP images confirms the above findings IMPRESSION: CT head: Unremarkable non-contrast CT appearance of the brain. No evidence of acute intracranial abnormality. CTA neck: 1. The right common and internal carotid arteries are patent within the neck. There is somewhat prominent calcified plaque within the carotid bifurcation and proximal ICA. Apparent stenosis of the proximal right ICA of up to 50-60%, although this stenosis may be overestimated due to blooming artifact from irregular calcified plaque at this site. Carotid artery duplex may be obtained for further evaluation, as clinically warranted. 2. The left common and internal carotids are patent within the neck, as is the left carotid stent. Artifact limits evaluation for intra-stent stenosis. 3. The right vertebral artery is patent. here are sites of moderate/severe atherosclerotic narrowing within the V1 and proximal V2 segments. Additionally, there is mild/moderate atherosclerotic narrowing of this vessel at the  C1-C2 level. 4. The left vertebral artery is patent. Severe atherosclerotic narrowing at the origin of this vessel. 5. Cervical spondylosis as described and greatest at C3-C4 where there is suspected at least moderate spinal canal stenosis. CTA head: 1. No  intracranial large vessel occlusion. 2. Intracranial atherosclerotic disease with multifocal stenoses, most notably as follows. 3. Sites of up to moderate stenosis within the paraclinoid right ICA and cavernous left ICA. 4. High-grade stenosis within the V4 right vertebral artery. 5. Moderate/severe focal stenosis within a P3 left PCA branch vessel. Electronically Signed   By: Kellie Simmering DO   On: 10/31/2019 20:28    ____________________________________________   PROCEDURES .Critical Care Performed by: Carrie Mew, MD Authorized by: Carrie Mew, MD   Critical care provider statement:    Critical care time (minutes):  35   Critical care time was exclusive of:  Separately billable procedures and treating other patients   Critical care was necessary to treat or prevent imminent or life-threatening deterioration of the following conditions:  Cardiac failure   Critical care was time spent personally by me on the following activities:  Development of treatment plan with patient or surrogate, discussions with consultants, evaluation of patient's response to treatment, examination of patient, obtaining history from patient or surrogate, ordering and performing treatments and interventions, ordering and review of laboratory studies, ordering and review of radiographic studies, pulse oximetry, re-evaluation of patient's condition and review of old charts    ____________________________________________  DIFFERENTIAL DIAGNOSIS   Postconcussive syndrome, carotid dissection, subdural hematoma, dehydration, electrolyte abnormality  CLINICAL IMPRESSION / ASSESSMENT AND PLAN / ED COURSE  Medications ordered in the ED: Medications  iohexol  (OMNIPAQUE) 350 MG/ML injection 75 mL (75 mLs Intravenous Contrast Given 10/31/19 1907)    Pertinent labs & imaging results that were available during my care of the patient were reviewed by me and considered in my medical decision making (see chart for details).  Paul Ala. was evaluated in Emergency Department on 10/31/2019 for the symptoms described in the history of present illness. He was evaluated in the context of the global COVID-19 pandemic, which necessitated consideration that the patient might be at risk for infection with the SARS-CoV-2 virus that causes COVID-19. Institutional protocols and algorithms that pertain to the evaluation of patients at risk for COVID-19 are in a state of rapid change based on information released by regulatory bodies including the CDC and federal and state organizations. These policies and algorithms were followed during the patient's care in the ED.   Patient presents with syncope at home as well as worsening headache and dizziness since blunt head trauma a week ago.  Due to the sudden worsening and syncope, I will obtain a CT angiogram of the head and neck to evaluate for vascular injury or interval development of a subdural hematoma.  Check labs due to end-stage renal disease.  Clinical Course as of Oct 30 2032  Tue Oct 31, 2019  6644 Troponin of 110 is increased from prior troponin of 60 ng/L (performed on prior troponin assay) from January 2020.  Coupled with syncope and comorbidities, patient will need hospitalization for further cardiac work-up.  Troponin I (High Sensitivity)(!!) [PS]  2023 Repeat troponin escalating from 110 to 197.  Awaiting CT angio result to ensure no intracranial hemorrhage and then will start IV heparin for non-STEMI and admit.   [PS]  2032 CT angiogram head and neck overall unremarkable without any acute findings. He does have underlying 50% stenosis of the proximal right ICA which would not be responsible for his symptoms  today. We will start heparin and plan to admit for further cardiac work-up.   [PS]    Clinical Course User Index [PS] Carrie Mew, MD  ____________________________________________   FINAL CLINICAL IMPRESSION(S) / ED DIAGNOSES    Final diagnoses:  Syncope, unspecified syncope type  NSTEMI (non-ST elevated myocardial infarction) ([place])  ESRD on hemodialysis ([place])  Type 2 diabetes mellitus without complication, without long-term current use of insulin [facility])     ED Discharge Orders    None      Portions of this note were generated with dragon dictation software. Dictation errors may occur despite best attempts at proofreading.   Carrie Mew, MD 10/31/19 2035

## 2019-10-31 NOTE — ED Notes (Signed)
Verified pt has [employer] ICD. Called [employer] [phone]. They stated pt has not been compliant w/ home monitoring. Local rep for company has been paged to interrogate ICD.

## 2019-10-31 NOTE — ED Notes (Signed)
Pt had syncopal episode 1-1.5 hours pta. Pt states he woke up in his wheelchair and his wheelchair was going in circles from his hand on control. Pt states he was drenched in sweat, dizzy and had a headache. Pt denies hx of seizures.

## 2019-10-31 NOTE — ED Triage Notes (Signed)
Pt arrived from home via [facility] following a syncopal episode. Pt states he woke up in his wheelchair w/ a headache and was dizzy and sweating. Pt was in [place] at 160 upon EMS arrival but converted after 5 minutes. Pt stated he had been dizzy earlier in the morning but it passed. Pt had dialysis yesterday and 4 L was removed instead of 3.3 L. Pt had BG of 245 per fire dept.

## 2019-10-31 NOTE — Progress Notes (Signed)
ANTICOAGULATION CONSULT NOTE - Initial Consult  Pharmacy Consult for Heparin  Indication: chest pain/ACS  Allergies  Allergen Reactions  . Other Other (See Comments)    Cardiac Problems. Pt states he tolerates Toradol. Due to kidney and heart problems per pt  . Ibuprofen Other (See Comments)    Heart problems  . Baclofen Other (See Comments)  . Metformin Diarrhea  . Nsaids     Due to kidney and heart problems per pt    Patient Measurements: Height: 5\' 9"  (175.3 cm) Weight: 83.9 kg (185 lb) IBW/kg (Calculated) : 70.7 Heparin Dosing Weight: 83.9 kg   Vital Signs: Temp: 98 F (36.7 C) (06/29 1659) Temp Source: Oral (06/29 1659) BP: 99/54 (06/29 2000) Pulse Rate: 85 (06/29 1800)  Labs: Recent Labs    10/31/19 1745 10/31/19 1928  HGB 12.5*  --   HCT 38.6*  --   PLT 285  --   CREATININE 10.56*  --   TROPONINIHS 110* 197*    Estimated Creatinine Clearance: 8 mL/min (A) (by C-G formula based on SCr of 10.56 mg/dL (H)).   Medical History: Past Medical History:  Diagnosis Date  . AICD (automatic cardioverter/defibrillator) present 2011  . Anemia   . Cardiac defibrillator in place    a. [employer] LUmax 540 DRT, (ser # [id number]).  . Carotid arterial disease ([place])    a. s/p prior LICA stenting;  b. 0/0370 Carotid U/S: 40-59% bilat ICA stenosis. Patent LICA stent.  . CHF (congestive heart failure) ([place])   . CKD (chronic kidney disease), stage III   . Coronary artery disease    a. 2010 s/p CABG x 3.  . DDD (degenerative disc disease), lumbosacral    L5-S1  . Diabetes ([place])    Lantus at bedtime  . Dialysis patient [facility])    M,W,F  . Employs prosthetic leg    Right BKA  . Gangrene of toe of right foot ([place])   . Gout of left hand 10/06/2016  . HFrEF (heart failure with reduced ejection fraction) ([place])    a. 07/2016 Echo: EF 25-30%, diff HK, mild MR, mildly dil LA, mod reduced RV fxn, PASP 73mmHg.  Marland Kitchen Hypertension    takes Coreg daily  . Ischemic cardiomyopathy     a. 07/2016 Echo: EF 25-30%, diff HK.  . Myocardial infarction ([place]) 2009  . Peripheral vascular disease ([place])   . Sleep apnea    sleep study yr ago-unable to afford cpap  . Stroke [facility]) 09   no weakness    Medications:  (Not in a hospital admission)   Assessment: Pharmacy consulted to dose heparin in this 54 year old male with ACS/NSTEMI.  CrCl = 8 ml/min ,  No prior anticoag noted  Goal of Therapy:  Heparin level 0.3-0.7 units/ml Monitor platelets by anticoagulation protocol: Yes   Plan:  Give 4000 units bolus x 1 Start heparin infusion at 1100 units/hr Check anti-Xa level in 8 hours and daily while on heparin Continue to monitor H&H and platelets  Gleb Mcguire D 10/31/2019,8:57 PM

## 2019-11-01 ENCOUNTER — Observation Stay: Admit: 2019-11-01 | Payer: Medicare HMO

## 2019-11-01 ENCOUNTER — Observation Stay (HOSPITAL_COMMUNITY)
Admit: 2019-11-01 | Discharge: 2019-11-01 | Disposition: A | Discharge status: Home / Self Care | Payer: Medicare HMO | Attending: Internal Medicine | Admitting: Internal Medicine

## 2019-11-01 DIAGNOSIS — L97509 Non-pressure chronic ulcer of other part of unspecified foot with unspecified severity: Secondary | ICD-10-CM

## 2019-11-01 DIAGNOSIS — Z95810 Presence of automatic (implantable) cardiac defibrillator: Secondary | ICD-10-CM | POA: Diagnosis not present

## 2019-11-01 DIAGNOSIS — I480 Paroxysmal atrial fibrillation: Secondary | ICD-10-CM | POA: Diagnosis present

## 2019-11-01 DIAGNOSIS — I950 Idiopathic hypotension: Secondary | ICD-10-CM

## 2019-11-01 DIAGNOSIS — I96 Gangrene, not elsewhere classified: Secondary | ICD-10-CM | POA: Diagnosis not present

## 2019-11-01 DIAGNOSIS — I452 Bifascicular block: Secondary | ICD-10-CM | POA: Diagnosis present

## 2019-11-01 DIAGNOSIS — E11621 Type 2 diabetes mellitus with foot ulcer: Secondary | ICD-10-CM | POA: Diagnosis not present

## 2019-11-01 DIAGNOSIS — I4892 Unspecified atrial flutter: Secondary | ICD-10-CM | POA: Diagnosis present

## 2019-11-01 DIAGNOSIS — I255 Ischemic cardiomyopathy: Secondary | ICD-10-CM | POA: Diagnosis present

## 2019-11-01 DIAGNOSIS — R55 Syncope and collapse: Secondary | ICD-10-CM | POA: Diagnosis present

## 2019-11-01 DIAGNOSIS — S91001A Unspecified open wound, right ankle, initial encounter: Secondary | ICD-10-CM | POA: Diagnosis present

## 2019-11-01 DIAGNOSIS — Z20822 Contact with and (suspected) exposure to covid-19: Secondary | ICD-10-CM | POA: Diagnosis present

## 2019-11-01 DIAGNOSIS — I472 Ventricular tachycardia, unspecified: Secondary | ICD-10-CM

## 2019-11-01 DIAGNOSIS — Z951 Presence of aortocoronary bypass graft: Secondary | ICD-10-CM | POA: Diagnosis not present

## 2019-11-01 DIAGNOSIS — I2510 Atherosclerotic heart disease of native coronary artery without angina pectoris: Secondary | ICD-10-CM

## 2019-11-01 DIAGNOSIS — L988 Other specified disorders of the skin and subcutaneous tissue: Secondary | ICD-10-CM | POA: Diagnosis not present

## 2019-11-01 DIAGNOSIS — R61 Generalized hyperhidrosis: Secondary | ICD-10-CM | POA: Diagnosis present

## 2019-11-01 DIAGNOSIS — N2581 Secondary hyperparathyroidism of renal origin: Secondary | ICD-10-CM | POA: Diagnosis present

## 2019-11-01 DIAGNOSIS — R778 Other specified abnormalities of plasma proteins: Secondary | ICD-10-CM | POA: Diagnosis not present

## 2019-11-01 DIAGNOSIS — I214 Non-ST elevation (NSTEMI) myocardial infarction: Secondary | ICD-10-CM | POA: Diagnosis present

## 2019-11-01 DIAGNOSIS — I951 Orthostatic hypotension: Secondary | ICD-10-CM | POA: Diagnosis present

## 2019-11-01 DIAGNOSIS — I5022 Chronic systolic (congestive) heart failure: Secondary | ICD-10-CM | POA: Diagnosis not present

## 2019-11-01 DIAGNOSIS — X58XXXA Exposure to other specified factors, initial encounter: Secondary | ICD-10-CM | POA: Diagnosis present

## 2019-11-01 DIAGNOSIS — I5042 Chronic combined systolic (congestive) and diastolic (congestive) heart failure: Secondary | ICD-10-CM | POA: Diagnosis present

## 2019-11-01 DIAGNOSIS — N186 End stage renal disease: Secondary | ICD-10-CM | POA: Diagnosis present

## 2019-11-01 DIAGNOSIS — I132 Hypertensive heart and chronic kidney disease with heart failure and with stage 5 chronic kidney disease, or end stage renal disease: Secondary | ICD-10-CM | POA: Diagnosis present

## 2019-11-01 DIAGNOSIS — Z992 Dependence on renal dialysis: Secondary | ICD-10-CM | POA: Diagnosis not present

## 2019-11-01 DIAGNOSIS — Z794 Long term (current) use of insulin: Secondary | ICD-10-CM

## 2019-11-01 DIAGNOSIS — Z89511 Acquired absence of right leg below knee: Secondary | ICD-10-CM | POA: Diagnosis not present

## 2019-11-01 DIAGNOSIS — I953 Hypotension of hemodialysis: Secondary | ICD-10-CM | POA: Diagnosis present

## 2019-11-01 DIAGNOSIS — I959 Hypotension, unspecified: Secondary | ICD-10-CM

## 2019-11-01 DIAGNOSIS — I25118 Atherosclerotic heart disease of native coronary artery with other forms of angina pectoris: Secondary | ICD-10-CM | POA: Diagnosis not present

## 2019-11-01 DIAGNOSIS — E1152 Type 2 diabetes mellitus with diabetic peripheral angiopathy with gangrene: Secondary | ICD-10-CM | POA: Diagnosis present

## 2019-11-01 DIAGNOSIS — Z8673 Personal history of transient ischemic attack (TIA), and cerebral infarction without residual deficits: Secondary | ICD-10-CM | POA: Diagnosis not present

## 2019-11-01 DIAGNOSIS — D631 Anemia in chronic kidney disease: Secondary | ICD-10-CM | POA: Diagnosis present

## 2019-11-01 DIAGNOSIS — E1122 Type 2 diabetes mellitus with diabetic chronic kidney disease: Secondary | ICD-10-CM | POA: Diagnosis present

## 2019-11-01 LAB — CBC
HCT: 36.2 % — ABNORMAL LOW (ref 39.0–52.0)
Hemoglobin: 12.2 g/dL — ABNORMAL LOW (ref 13.0–17.0)
MCH: 32 pg (ref 26.0–34.0)
MCHC: 33.7 g/dL (ref 30.0–36.0)
MCV: 95 fL (ref 80.0–100.0)
Platelets: 266 10*3/uL (ref 150–400)
RBC: 3.81 MIL/uL — ABNORMAL LOW (ref 4.22–5.81)
RDW: 14.2 % (ref 11.5–15.5)
WBC: 12.8 10*3/uL — ABNORMAL HIGH (ref 4.0–10.5)
nRBC: 0 % (ref 0.0–0.2)

## 2019-11-01 LAB — GLUCOSE, CAPILLARY
Glucose-Capillary: 106 mg/dL — ABNORMAL HIGH (ref 70–99)
Glucose-Capillary: 81 mg/dL (ref 70–99)
Glucose-Capillary: 171 mg/dL — ABNORMAL HIGH (ref 70–99)
Glucose-Capillary: 138 mg/dL — ABNORMAL HIGH (ref 70–99)

## 2019-11-01 LAB — BASIC METABOLIC PANEL
Anion gap: 12 (ref 5–15)
BUN: 54 mg/dL — ABNORMAL HIGH (ref 6–20)
CO2: 23 mmol/L (ref 22–32)
Calcium: 7.4 mg/dL — ABNORMAL LOW (ref 8.9–10.3)
Chloride: 99 mmol/L (ref 98–111)
Creatinine, Ser: 11.28 mg/dL — ABNORMAL HIGH (ref 0.61–1.24)
GFR calc Af Amer: 5 mL/min — ABNORMAL LOW (ref >60)
GFR calc non Af Amer: 5 mL/min — ABNORMAL LOW (ref >60)
Glucose, Bld: 159 mg/dL — ABNORMAL HIGH (ref 70–99)
Potassium: 3.6 mmol/L (ref 3.5–5.1)
Sodium: 134 mmol/L — ABNORMAL LOW (ref 135–145)

## 2019-11-01 LAB — HIV ANTIBODY (ROUTINE TESTING W REFLEX): HIV Screen 4th Generation wRfx: NONREACTIVE

## 2019-11-01 LAB — HEPARIN LEVEL (UNFRACTIONATED): Heparin Unfractionated: <0.1 IU/mL — ABNORMAL LOW (ref 0.30–0.70)

## 2019-11-01 LAB — HEPATITIS B SURFACE ANTIGEN: Hepatitis B Surface Ag: NONREACTIVE

## 2019-11-01 LAB — ECHOCARDIOGRAM COMPLETE
Height: 69 in
Weight: 2960 oz

## 2019-11-01 LAB — TROPONIN I (HIGH SENSITIVITY): Troponin I (High Sensitivity): 568 ng/L (ref <18)

## 2019-11-01 MED ORDER — MAGNESIUM SULFATE 2 GM/50ML IV SOLN
2.0000 g | Freq: Once | INTRAVENOUS | Status: DC
  Filled 2019-11-01: qty 50

## 2019-11-01 MED ORDER — AMIODARONE HCL 200 MG PO TABS: 100.0000 mg | ORAL_TABLET | Freq: Every day | ORAL | Status: DC

## 2019-11-01 MED ORDER — DOXYCYCLINE HYCLATE 100 MG PO TABS
100.0000 mg | ORAL_TABLET | Freq: Two times a day (BID) | ORAL | Status: DC
  Administered 2019-11-01 – 2019-11-02 (×3): 100 mg via ORAL
  Filled 2019-11-01 (×3): qty 1

## 2019-11-01 MED ORDER — AMIODARONE HCL 200 MG PO TABS
200.0000 mg | ORAL_TABLET | Freq: Every day | ORAL | Status: DC
  Administered 2019-11-01 – 2019-11-04 (×4): 200 mg via ORAL
  Filled 2019-11-01 (×4): qty 1

## 2019-11-01 MED ORDER — OXYCODONE HCL 5 MG PO TABS
5.0000 mg | ORAL_TABLET | Freq: Four times a day (QID) | ORAL | Status: DC | PRN
  Administered 2019-11-01: 5 mg via ORAL
  Filled 2019-11-01: qty 1

## 2019-11-01 MED ORDER — SODIUM CHLORIDE 0.9 % IV SOLN: INTRAVENOUS | Status: DC | PRN

## 2019-11-01 MED ORDER — SODIUM CHLORIDE 0.9 % IV BOLUS
500.0000 mL | Freq: Once | INTRAVENOUS | Status: AC
  Administered 2019-11-01: 500 mL via INTRAVENOUS

## 2019-11-01 MED ORDER — PREDNISONE 10 MG PO TABS
5.0000 mg | ORAL_TABLET | Freq: Once | ORAL | Status: AC
  Administered 2019-11-01: 5 mg via ORAL
  Filled 2019-11-01: qty 1

## 2019-11-01 MED ORDER — HYDROCODONE-ACETAMINOPHEN 5-325 MG PO TABS
1.0000 | ORAL_TABLET | Freq: Four times a day (QID) | ORAL | Status: DC | PRN
  Administered 2019-11-01 – 2019-11-04 (×6): 1 via ORAL
  Filled 2019-11-01 (×6): qty 1

## 2019-11-01 MED ORDER — CHLORHEXIDINE GLUCONATE CLOTH 2 % EX PADS
6.0000 | MEDICATED_PAD | Freq: Every day | CUTANEOUS | Status: DC
  Administered 2019-11-04: 6 via TOPICAL

## 2019-11-01 MED ORDER — MIDODRINE HCL 5 MG PO TABS
10.0000 mg | ORAL_TABLET | ORAL | Status: DC
  Administered 2019-11-01: 10 mg via ORAL
  Filled 2019-11-01 (×2): qty 2

## 2019-11-01 MED ORDER — PREDNISONE 10 MG PO TABS: 5.0000 mg | ORAL_TABLET | Freq: Two times a day (BID) | ORAL | Status: DC

## 2019-11-01 MED ORDER — PREDNISONE 10 MG PO TABS
5.0000 mg | ORAL_TABLET | Freq: Every day | ORAL | Status: DC
  Administered 2019-11-02 – 2019-11-04 (×3): 5 mg via ORAL
  Filled 2019-11-01 (×3): qty 1

## 2019-11-01 MED ORDER — MAGNESIUM SULFATE 2 GM/50ML IV SOLN
2.0000 g | Freq: Once | INTRAVENOUS | Status: AC
  Administered 2019-11-01: 2 g via INTRAVENOUS

## 2019-11-01 MED ORDER — INSULIN ASPART 100 UNIT/ML ~~LOC~~ SOLN
0.0000 [IU] | Freq: Three times a day (TID) | SUBCUTANEOUS | Status: DC
  Administered 2019-11-01 – 2019-11-04 (×3): 1 [IU] via SUBCUTANEOUS
  Filled 2019-11-01 (×2): qty 1

## 2019-11-01 MED ORDER — ATORVASTATIN CALCIUM 20 MG PO TABS
40.0000 mg | ORAL_TABLET | Freq: Every day | ORAL | Status: DC
  Administered 2019-11-01 – 2019-11-04 (×4): 40 mg via ORAL
  Filled 2019-11-01 (×4): qty 2

## 2019-11-01 MED ORDER — CALCIUM ACETATE (PHOS BINDER) 667 MG PO CAPS
1334.0000 mg | ORAL_CAPSULE | Freq: Three times a day (TID) | ORAL | Status: DC
  Administered 2019-11-02 – 2019-11-04 (×7): 1334 mg via ORAL
  Filled 2019-11-01 (×11): qty 2

## 2019-11-01 NOTE — Progress Notes (Signed)
Treatment completed   11/01/19 1145  Hand-Off documentation  Handoff Given Given to shift RN/LPN  Report given to (Full Name) [facility] Received Received from shift RN/LPN  Report received from (Full Name) Sherren Mocha RN  Vital Signs  Temp 98.2 F (36.8 C)  Temp Source Oral  Pulse Rate 92  Pulse Rate Source Monitor  Resp 18  BP (!) 78/32  BP Location Right Wrist  BP Method Automatic  Patient Position (if appropriate) Lying  Oxygen Therapy  SpO2 99 %  O2 Device Room Air  Pain Assessment  Pain Scale 0-10  Pain Score 0  During Hemodialysis Assessment  Blood Flow Rate (mL/min) 150 mL/min  Arterial Pressure (mmHg) 10 mmHg  Venous Pressure (mmHg) 90 mmHg  Transmembrane Pressure (mmHg) 50 mmHg  Ultrafiltration Rate (mL/min) 0 mL/min  Dialysate Flow Rate (mL/min) 600 ml/min  Conductivity: Machine  14.1  HD Safety Checks Performed Yes  KECN 64.6 KECN  Dialysis Fluid Bolus Normal Saline  Bolus Amount (mL) 250 mL  Intra-Hemodialysis Comments Tolerated well;Tx completed  Post-Hemodialysis Assessment  Rinseback Volume (mL) 250 mL  KECN 64.6 V  Dialyzer Clearance Clear  Hemodialysis Intake (mL) 500 mL  UF Total -Machine (mL) 2000 mL  Net UF (mL) 1500 mL  Tolerated HD Treatment Yes  Post-Hemodialysis Comments tolerated well treatment completed  Education / Care Plan  Dialysis Education Provided Yes

## 2019-11-01 NOTE — Progress Notes (Signed)
Hemodialysis patient known at [facility] TTS 11:40. Patient normally ACTA. Please contact me with any dialysis placement concerns.  Elvera Bicker Dialysis Coordinator [phone]

## 2019-11-01 NOTE — Consult Note (Signed)
Cardiology Consultation:   Patient ID: Paul [name]. MRN: [id number]; DOB: [birth year]  Admit date: 10/31/2019 Date of Consult: 11/01/2019  Primary Care Provider: Tracie Harrier, MD [facility] Cardiologist: Dr. Minna Merritts [facility] [place] [facility] Electrophysiologist:  None    Patient Profile:   Paul [name]. is a 54 y.o. male with a hx of ischemic cardiomyopathy, VT, status post AICD who is being seen today for the evaluation of syncope at the request of Dr. Roosevelt Locks.  History of Present Illness:   Paul [name] 54 year old-year-old with history of ischemic cardiomyopathy, status post AICD, CKD stage III, CAD status post CABG, right BKA, end-stage renal disease on dialysis Monday Wednesday Friday who presents due to a syncopal episode.  Patient states having episodes of dizziness and low blood pressures typically after dialysis sessions.  He was originally placed on blood pressure medications, but these were not subsequently stopped due to low blood pressures.  Symptoms have worsened over the past week or so.  States feeling very dizzy 2 days ago and diaphoretic after dialysis session.  His systolic blood pressure was around 70s.  Yesterday, while at home he states feeling dizzy, and felt his heart beating fast for about 45 minutes.  He states passing out, not knowing how long he was out for.  He has a remote controlled wheelchair which he uses for mobility.  Upon waking up, he states his wheelchair was going in circles due to his thumb pressing on the controlled.  EMS was called and patient brought to the emergency room.  En route to the hospital, patient states EMS mentioned him being in VT. he denies having any shock from his ICD  He states having prior episodes of ICD shocks over 6 months ago when he was off amiodarone.  He currently takes amiodarone 100 mg daily.  Denies any shocks since being on amiodarone.  In the ED, EKG showed sinus rhythm with right bundle branch  block.  Electrolytes showed normal magnesium, potassium 3.5.  His ICD was interrogated and polymorphic VT was noted.  He is currently feeling dizzy and systolic blood pressures in the 80s.  He just had dialysis couple of hours ago.   Past Medical History:  Diagnosis Date  . AICD (automatic cardioverter/defibrillator) present 2011  . Anemia   . Cardiac defibrillator in place    a. Biotronik LUmax 540 DRT, (ser # [id number]).  . Carotid arterial disease ([place])    a. s/p prior LICA stenting;  b. 09/1759 Carotid U/S: 40-59% bilat ICA stenosis. Patent LICA stent.  . CHF (congestive heart failure) ([place])   . CKD (chronic kidney disease), stage III   . Coronary artery disease    a. 2010 s/p CABG x 3.  . DDD (degenerative disc disease), lumbosacral    L5-S1  . Diabetes ([place])    Lantus at bedtime  . Dialysis patient [facility])    M,W,F  . Employs prosthetic leg    Right BKA  . Gangrene of toe of right foot ([place])   . Gout of left hand 10/06/2016  . HFrEF (heart failure with reduced ejection fraction) ([place])    a. 07/2016 Echo: EF 25-30%, diff HK, mild MR, mildly dil LA, mod reduced RV fxn, PASP 27mmHg.  Marland Kitchen Hypertension    takes Coreg daily  . Ischemic cardiomyopathy    a. 07/2016 Echo: EF 25-30%, diff HK.  . Myocardial infarction ([place]) 2009  . Peripheral vascular disease ([place])   . Sleep apnea  sleep study yr ago-unable to afford cpap  . Stroke [facility]) 09   no weakness    Past Surgical History:  Procedure Laterality Date  . AMPUTATION Right 12/23/2018   Procedure: AMPUTATION BELOW KNEE (Right);  Surgeon: Algernon Huxley, MD;  Location: [facility] ORS;  Service: General;  Laterality: Right;  . AMPUTATION TOE Right 08/22/2016   Procedure: AMPUTATION TOE;  Surgeon: Samara Deist, DPM;  Location: [facility] OR[facility];  Service: Podiatry;  Laterality: Right;  . AMPUTATION TOE Right 10/09/2016   Procedure: AMPUTATION TOE-RIGHT 2ND MPJ;  Surgeon: Samara Deist, DPM;  Location: [facility] OR[facility];  Service: Podiatry;  Laterality:  Right;  . APLIGRAFT PLACEMENT Right 10/09/2016   Procedure: APLIGRAFT PLACEMENT;  Surgeon: Samara Deist, DPM;  Location: [facility] OR[facility];  Service: Podiatry;  Laterality: Right;  . AV FISTULA PLACEMENT Right 05/11/2019   Procedure: INSERTION OF ARTERIOVENOUS (AV) GORE-TEX GRAFT ARM;  Surgeon: Algernon Huxley, MD;  Location: [facility] OR[facility];  Service: Vascular;  Laterality: Right;  . CALCANEAL OSTEOTOMY Bilateral 07/28/2018   Procedure: RIGHT CALCANECTOMY BILATERAL DEBRIDEMENT OF ULCERS ON HEELS;  Surgeon: Albertine Patricia, DPM;  Location: [facility] OR[facility];  Service: Podiatry;  Laterality: Bilateral;  . CARDIAC DEFIBRILLATOR PLACEMENT  2011  . CAROTID ENDARTERECTOMY Left   . CHOLECYSTECTOMY  2010  . CORONARY ARTERY BYPASS GRAFT  2010   CABG x 3   . DIALYSIS/PERMA CATHETER INSERTION N/A 10/07/2018   Procedure: DIALYSIS/PERMA CATHETER INSERTION;  Surgeon: Katha Cabal, MD;  Location: [place] [facility];  Service: Cardiovascular;  Laterality: N/A;  . GRAFT APPLICATION Right 3/97/6734   Procedure: FULL THICKNESS SKIN GRAFT-RIGHT FOOT;  Surgeon: Algernon Huxley, MD;  Location: [facility];  Service: Vascular;  Laterality: Right;  . HIP SURGERY Right 1994  . INCISION AND DRAINAGE Right 09/08/2018   Procedure: INCISION AND DRAINAGE - [place] OF DEFECTIVE SKIN, SOFT TISSUE AND BONE;  Surgeon: Albertine Patricia, DPM;  Location: [facility];  Service: Podiatry;  Laterality: Right;  . INCISION AND DRAINAGE OF WOUND Right 08/22/2016   Procedure: IRRIGATION AND DEBRIDEMENT WOUND and wound vac placement;  Surgeon: Samara Deist, DPM;  Location: [facility];  Service: Podiatry;  Laterality: Right;  . IRRIGATION AND DEBRIDEMENT ABSCESS Right 11/24/2018   Procedure: IRRIGATION AND DEBRIDEMENT ABSCESS RIGHT FOOT, COMPLICATED, DIABETIC;  Surgeon: Albertine Patricia, DPM;  Location: [facility];  Service: Podiatry;  Laterality: Right;  . LOWER EXTREMITY ANGIOGRAPHY Right 08/24/2016   Procedure: Lower Extremity Angiography;  Surgeon: Algernon Huxley, MD;   Location: [place] [facility];  Service: Cardiovascular;  Laterality: Right;  . LOWER EXTREMITY ANGIOGRAPHY Right 07/27/2018   Procedure: RIGHT Lower Extremity Angiography;  Surgeon: Algernon Huxley, MD;  Location: [place] [facility];  Service: Cardiovascular;  Laterality: Right;  . LOWER EXTREMITY ANGIOGRAPHY Right 09/09/2018   Procedure: Lower Extremity Angiography;  Surgeon: Katha Cabal, MD;  Location: [place] [facility];  Service: Cardiovascular;  Laterality: Right;  . MASS EXCISION Right 07/18/2014   Procedure: EXCISION HETEROTOPIC BONE RIGHT HIP;  Surgeon: Frederik Pear, MD;  Location: [place];  Service: Orthopedics;  Laterality: Right;  . Open Heart Surgery  2010   x 3  . PERIPHERAL VASCULAR THROMBECTOMY Right 07/31/2019   Procedure: PERIPHERAL VASCULAR THROMBECTOMY;  Surgeon: Algernon Huxley, MD;  Location: [place] [facility];  Service: Cardiovascular;  Laterality: Right;  . PERIPHERAL VASCULAR THROMBECTOMY Right 08/17/2019   Procedure: PERIPHERAL VASCULAR THROMBECTOMY;  Surgeon: Algernon Huxley, MD;  Location: [place] [facility];  Service: Cardiovascular;  Laterality: Right;  . PERIPHERAL VASCULAR THROMBECTOMY Right 09/14/2019   Procedure: PERIPHERAL VASCULAR THROMBECTOMY;  Surgeon: Algernon Huxley, MD;  Location: [place] [facility];  Service: Cardiovascular;  Laterality: Right;  . WOUND DEBRIDEMENT Right 10/09/2016   Procedure: DEBRIDEMENT WOUND;  Surgeon: Samara Deist, DPM;  Location: [facility];  Service: Podiatry;  Laterality: Right;     Home Medications:  Prior to Admission medications   Medication Sig Start Date End Date Taking? Authorizing Provider  allopurinol (ZYLOPRIM) 100 MG tablet Take 1 tablet (100 mg total) by mouth daily. 08/02/18  Yes Fritzi Mandes, MD  amiodarone (PACERONE) 100 MG tablet Take 100 mg by mouth daily.  01/15/18  Yes [provider]  clopidogrel (PLAVIX) 75 MG tablet Take 1 tablet (75 mg total) by mouth daily with breakfast. 07/31/19  Yes Dew, Erskine Squibb, MD   doxycycline (VIBRAMYCIN) 100 MG capsule Take 100 mg by mouth 2 (two) times daily. 10/26/19  Yes [provider]  HYDROcodone-acetaminophen (NORCO) 5-325 MG tablet Take 1 tablet by mouth every 6 (six) hours as needed for moderate pain. Patient taking differently: TAKE 1 TABLET BY MOUTH TWICE DAILY AS NEEDED FOR PAIN FOR UP TO 15 DAYS 10/23/19  Yes Carrie Mew, MD  predniSONE (DELTASONE) 10 MG tablet Take 5 mg by mouth daily. TAKE 1 TABLET BY MOUTH EVERY DAY   Yes [provider]    Inpatient Medications: Scheduled Meds: . amiodarone  200 mg Oral Daily  . calcium acetate  1,334 mg Oral TID WC  . Chlorhexidine Gluconate Cloth  6 each Topical Q0600  . clopidogrel  75 mg Oral Q breakfast  . doxycycline  100 mg Oral Q12H  . heparin  5,000 Units Subcutaneous Q8H  . insulin aspart  0-6 Units Subcutaneous TID WC  . midodrine  10 mg Oral Q M,W,F-HD  . [START ON 11/02/2019] predniSONE  5 mg Oral Q breakfast  . sodium chloride flush  3 mL Intravenous Q12H   Continuous Infusions: . sodium chloride Stopped (11/01/19 0234)  . sodium chloride     PRN Meds: sodium chloride, acetaminophen **OR** acetaminophen, oxyCODONE  Allergies:    Allergies  Allergen Reactions  . Other Other (See Comments)    Cardiac Problems. Pt states he tolerates Toradol. Due to kidney and heart problems per pt  . Ibuprofen Other (See Comments)    Heart problems  . Baclofen Other (See Comments)  . Metformin Diarrhea  . Nsaids     Due to kidney and heart problems per pt    Social History:   Social History   Socioeconomic History  . Marital status: Single    Spouse name: Not on file  . Number of children: Not on file  . Years of education: Not on file  . Highest education level: Not on file  Occupational History  . Not on file  Tobacco Use  . Smoking status: Never Smoker  . Smokeless tobacco: Never Used  Vaping Use  . Vaping Use: Never used  Substance and Sexual Activity  . Alcohol  use: No  . Drug use: Yes    Types: Marijuana  . Sexual activity: Not on file  Other Topics Concern  . Not on file  Social History Narrative   Lives at home alone   Social Determinants of Health   Financial Resource Strain:   . Difficulty of Paying Living Expenses:   Food Insecurity: Food Insecurity Present  . Worried About Charity fundraiser in the Last Year: Sometimes  true  . Ran Out of Food in the Last Year: Never true  Transportation Needs: Unmet Transportation Needs  . Lack of Transportation (Medical): Yes  . Lack of Transportation (Non-Medical): Yes  Physical Activity:   . Days of Exercise per Week:   . Minutes of Exercise per Session:   Stress:   . Feeling of Stress :   Social Connections:   . Frequency of Communication with Friends and Family:   . Frequency of Social Gatherings with Friends and Family:   . Attends Religious Services:   . Active Member of Clubs or Organizations:   . Attends Archivist Meetings:   Marland Kitchen Marital Status:   Intimate Partner Violence:   . Fear of Current or Ex-Partner:   . Emotionally Abused:   Marland Kitchen Physically Abused:   . Sexually Abused:     Family History:    Family History  Problem Relation Age of Onset  . Hypertension Other   . Diabetes Other   . Diabetes Father   . Hyperlipidemia Father   . Hypertension Father   . Hyperlipidemia Sister   . Hypertension Sister   . Diabetes Sister   . Diabetes Brother   . Hypertension Brother   . Hyperlipidemia Brother      ROS:  Please see the history of present illness.   All other ROS reviewed and negative.     Physical Exam/Data:   Vitals:   11/01/19 1130 11/01/19 1200 11/01/19 1545 11/01/19 1546  BP: (!) 80/65 (!) 85/56 (!) 82/40 (!) 70/24  Pulse:   90 88  Resp: 15 (!) 0 17 17  Temp:   98.7 F (37.1 C) 98.7 F (37.1 C)  TempSrc:   Oral Oral  SpO2:   99% 96%  Weight:      Height:        Intake/Output Summary (Last 24 hours) at 11/01/2019 1646 Last data filed at  11/01/2019 0600 Gross per 24 hour  Intake 512.21 ml  Output --  Net 512.21 ml   Last 3 Weights 10/31/2019 10/31/2019 09/14/2019  Weight (lbs) 185 lb 187 lb 188 lb  Weight (kg) 83.915 kg 84.823 kg 85.276 kg     Body mass index is 27.32 kg/m.  General:  Well nourished, well developed, in no acute distress HEENT: normal Lymph: no adenopathy Neck: no JVD Endocrine:  No thryomegaly Vascular: No carotid bruits; FA pulses 2+ bilaterally without bruits  Cardiac:  normal S1, S2; RRR; no murmur  Lungs: Clear anteriorly, decreased breath sounds at bases Abd: soft, nontender, no hepatomegaly  Ext: no edema Musculoskeletal: Right BKA noted Skin: warm and dry  Neuro:  CNs 2-12 intact, no focal abnormalities noted Psych:  Normal affect   EKG:  The EKG was personally reviewed and demonstrates: Sinus rhythm, right bundle branch block, left anterior fascicular block. Telemetry:  Telemetry was personally reviewed and demonstrates: Sinus rhythm  Relevant CV Studies: TTE 11/01/2019 1. Left ventricular ejection fraction, by estimation, is 25 to 30%. The  left ventricle has severely decreased function. The left ventricle  demonstrates global hypokinesis. Left ventricular diastolic parameters are  consistent with Grade I diastolic  dysfunction (impaired relaxation).  2. Right ventricular systolic function is moderately reduced. The right  ventricular size is normal. There is normal pulmonary artery systolic  pressure.  3. The mitral valve is normal in structure. No evidence of mitral valve  regurgitation.  4. The aortic valve is normal in structure. Aortic valve regurgitation is  not visualized.  5. The inferior vena cava is normal in size with greater than 50%  respiratory variability, suggesting right atrial pressure of 3 mmHg.   Laboratory Data:  High Sensitivity Troponin:   Recent Labs  Lab 10/31/19 1745 10/31/19 1928 10/31/19 2201 11/01/19 0026  TROPONINIHS 110* 197* 408* 568*      Chemistry Recent Labs  Lab 10/31/19 1745 11/01/19 0442  NA 136 134*  K 3.5 3.6  CL 99 99  CO2 21* 23  GLUCOSE 230* 159*  BUN 48* 54*  CREATININE 10.56* 11.28*  CALCIUM 7.6* 7.4*  GFRNONAA 5* 5*  GFRAA 6* 5*  ANIONGAP 16* 12    Recent Labs  Lab 10/31/19 1745  PROT 6.9  ALBUMIN 3.5  AST 15  ALT 16  ALKPHOS 89  BILITOT 0.6   Hematology Recent Labs  Lab 10/31/19 1745 11/01/19 0442  WBC 13.7* 12.8*  RBC 3.98* 3.81*  HGB 12.5* 12.2*  HCT 38.6* 36.2*  MCV 97.0 95.0  MCH 31.4 32.0  MCHC 32.4 33.7  RDW 14.0 14.2  PLT 285 266   BNPNo results for input(s): BNP, PROBNP in the last 168 hours.  DDimer No results for input(s): DDIMER in the last 168 hours.   Radiology/Studies:  CT Angio Head W or Wo Contrast  Result Date: 10/31/2019 CLINICAL DATA:  Headache, sudden, carotid/vertebral dissection suspected. Additional history provided: Syncopal episode patient reports he awoke in his wheelchair with headache and dizziness as well as sweating. EXAM: CT ANGIOGRAPHY HEAD AND NECK TECHNIQUE: Multidetector CT imaging of the head and neck was performed using the standard protocol during bolus administration of intravenous contrast. Multiplanar CT image reconstructions and MIPs were obtained to evaluate the vascular anatomy. Carotid stenosis measurements (when applicable) are obtained utilizing NASCET criteria, using the distal internal carotid diameter as the denominator. CONTRAST:  86mL OMNIPAQUE IOHEXOL 350 MG/ML SOLN COMPARISON:  Head CT 10/23/2019, report from CT angiogram neck 07/24/2008 (images unavailable), report from carotid artery duplex 09/07/2018. FINDINGS: CT HEAD FINDINGS Brain: Cerebral volume is normal for age. There is no acute intracranial hemorrhage. No demarcated cortical infarct is identified. No extra-axial fluid collection. No evidence of intracranial mass. No midline shift. Vascular: Reported below. Skull: Normal. Negative for fracture or focal lesion. Sinuses: No  significant paranasal sinus disease or mastoid effusion at the imaged levels. Orbits: No acute abnormality. Review of the MIP images confirms the above findings CTA NECK FINDINGS Aortic arch: Common origin of the innominate and left common carotid arteries. Atherosclerotic plaque within the visualized aortic arch and proximal major branch vessels of the neck. No hemodynamically significant innominate or proximal subclavian artery stenosis. Right carotid system: CCA and ICA patent within the neck. Mild scattered mixed plaque within the CCA. There is more prominent calcified plaque within the carotid bifurcation and proximal ICA. There is stenosis of the proximal ICA estimated at up to 50-60%, although this may be overestimated on the current exam due to blooming artifact from irregular calcified plaque. Left carotid system: The CCA and ICA are patent. There is a stent extending from the distal CCA into the proximal ICA. There is no hemodynamically significant stenosis within the CCA or cervical ICA proximal or distal to the stent. The stent is patent, although artifact limits evaluation for intra-stent stenosis. Vertebral arteries: The vertebral arteries are codominant. There is fairly prominent multifocal mixed plaque within the V1 and proximal V2 right vertebral artery with sites of moderate/severe stenosis. There is also calcified plaque within the distal cervical right vertebral artery at  the C1-C2 level with up to mild/moderate stenosis at this site. The left vertebral artery is patent throughout the neck with high-grade stenosis at its origin. Skeleton: No acute bony abnormality or aggressive osseous lesion. Cervical spondylosis. Most notably at C3-C4, there is advanced disc space narrowing and a disc bulge with osteophyte ridge contributes to suspected at least moderate spinal canal stenosis. Other neck: No neck mass or cervical lymphadenopathy. Thyroid unremarkable. Upper chest: No consolidation within the  imaged lung apices. Prior median sternotomy. Partially imaged left chest multi lead AICD device. Partially imaged right IJ approach central venous catheter. Review of the MIP images confirms the above findings CTA HEAD FINDINGS Anterior circulation: The intracranial internal carotid arteries are patent. Calcified plaque within both vessels. Sites of up to moderate stenosis within the paraclinoid right ICA. Sites of moderate stenosis within the cavernous left ICA. The M1 middle cerebral arteries are patent without significant stenosis. No M2 proximal branch occlusion or high-grade proximal arterial stenosis is identified. The anterior cerebral arteries are patent without significant proximal stenosis. No intracranial aneurysm is identified. Posterior circulation: The intracranial vertebral arteries are patent. Calcified plaque results in severe stenosis within the right V4 segment. The basilar artery is patent without significant stenosis. The posterior cerebral arteries are patent bilaterally. Moderate/severe stenosis within a P3 left PCA branch (series 13, image 32). Posterior communicating arteries are hypoplastic or absent bilaterally. Venous sinuses: Within limitations of contrast timing, no convincing thrombus. Anatomic variants: As described. Review of the MIP images confirms the above findings IMPRESSION: CT head: Unremarkable non-contrast CT appearance of the brain. No evidence of acute intracranial abnormality. CTA neck: 1. The right common and internal carotid arteries are patent within the neck. There is somewhat prominent calcified plaque within the carotid bifurcation and proximal ICA. Apparent stenosis of the proximal right ICA of up to 50-60%, although this stenosis may be overestimated due to blooming artifact from irregular calcified plaque at this site. Carotid artery duplex may be obtained for further evaluation, as clinically warranted. 2. The left common and internal carotids are patent within  the neck, as is the left carotid stent. Artifact limits evaluation for intra-stent stenosis. 3. The right vertebral artery is patent. here are sites of moderate/severe atherosclerotic narrowing within the V1 and proximal V2 segments. Additionally, there is mild/moderate atherosclerotic narrowing of this vessel at the C1-C2 level. 4. The left vertebral artery is patent. Severe atherosclerotic narrowing at the origin of this vessel. 5. Cervical spondylosis as described and greatest at C3-C4 where there is suspected at least moderate spinal canal stenosis. CTA head: 1. No intracranial large vessel occlusion. 2. Intracranial atherosclerotic disease with multifocal stenoses, most notably as follows. 3. Sites of up to moderate stenosis within the paraclinoid right ICA and cavernous left ICA. 4. High-grade stenosis within the V4 right vertebral artery. 5. Moderate/severe focal stenosis within a P3 left PCA branch vessel. Electronically Signed   By: Kellie Simmering DO   On: 10/31/2019 20:28   DG Chest 2 View  Result Date: 10/31/2019 CLINICAL DATA:  Syncope. EXAM: CHEST - 2 VIEW COMPARISON:  04/10/2019 FINDINGS: There is a well-positioned right-sided tunneled dialysis catheter. The heart size is stable but enlarged. The patient is status post prior median sternotomy. There is a dual chamber left-sided pacemaker/ICD with stable positioning of the leads. There is no pneumothorax. No significant pleural effusion. No focal infiltrate. There is no acute osseous abnormality. IMPRESSION: No active cardiopulmonary disease. Electronically Signed   By: Jamie Kato.D.  On: 10/31/2019 21:33   CT Angio Neck W and/or Wo Contrast  Result Date: 10/31/2019 CLINICAL DATA:  Headache, sudden, carotid/vertebral dissection suspected. Additional history provided: Syncopal episode patient reports he awoke in his wheelchair with headache and dizziness as well as sweating. EXAM: CT ANGIOGRAPHY HEAD AND NECK TECHNIQUE: Multidetector CT  imaging of the head and neck was performed using the standard protocol during bolus administration of intravenous contrast. Multiplanar CT image reconstructions and MIPs were obtained to evaluate the vascular anatomy. Carotid stenosis measurements (when applicable) are obtained utilizing NASCET criteria, using the distal internal carotid diameter as the denominator. CONTRAST:  39mL OMNIPAQUE IOHEXOL 350 MG/ML SOLN COMPARISON:  Head CT 10/23/2019, report from CT angiogram neck 07/24/2008 (images unavailable), report from carotid artery duplex 09/07/2018. FINDINGS: CT HEAD FINDINGS Brain: Cerebral volume is normal for age. There is no acute intracranial hemorrhage. No demarcated cortical infarct is identified. No extra-axial fluid collection. No evidence of intracranial mass. No midline shift. Vascular: Reported below. Skull: Normal. Negative for fracture or focal lesion. Sinuses: No significant paranasal sinus disease or mastoid effusion at the imaged levels. Orbits: No acute abnormality. Review of the MIP images confirms the above findings CTA NECK FINDINGS Aortic arch: Common origin of the innominate and left common carotid arteries. Atherosclerotic plaque within the visualized aortic arch and proximal major branch vessels of the neck. No hemodynamically significant innominate or proximal subclavian artery stenosis. Right carotid system: CCA and ICA patent within the neck. Mild scattered mixed plaque within the CCA. There is more prominent calcified plaque within the carotid bifurcation and proximal ICA. There is stenosis of the proximal ICA estimated at up to 50-60%, although this may be overestimated on the current exam due to blooming artifact from irregular calcified plaque. Left carotid system: The CCA and ICA are patent. There is a stent extending from the distal CCA into the proximal ICA. There is no hemodynamically significant stenosis within the CCA or cervical ICA proximal or distal to the stent. The  stent is patent, although artifact limits evaluation for intra-stent stenosis. Vertebral arteries: The vertebral arteries are codominant. There is fairly prominent multifocal mixed plaque within the V1 and proximal V2 right vertebral artery with sites of moderate/severe stenosis. There is also calcified plaque within the distal cervical right vertebral artery at the C1-C2 level with up to mild/moderate stenosis at this site. The left vertebral artery is patent throughout the neck with high-grade stenosis at its origin. Skeleton: No acute bony abnormality or aggressive osseous lesion. Cervical spondylosis. Most notably at C3-C4, there is advanced disc space narrowing and a disc bulge with osteophyte ridge contributes to suspected at least moderate spinal canal stenosis. Other neck: No neck mass or cervical lymphadenopathy. Thyroid unremarkable. Upper chest: No consolidation within the imaged lung apices. Prior median sternotomy. Partially imaged left chest multi lead AICD device. Partially imaged right IJ approach central venous catheter. Review of the MIP images confirms the above findings CTA HEAD FINDINGS Anterior circulation: The intracranial internal carotid arteries are patent. Calcified plaque within both vessels. Sites of up to moderate stenosis within the paraclinoid right ICA. Sites of moderate stenosis within the cavernous left ICA. The M1 middle cerebral arteries are patent without significant stenosis. No M2 proximal branch occlusion or high-grade proximal arterial stenosis is identified. The anterior cerebral arteries are patent without significant proximal stenosis. No intracranial aneurysm is identified. Posterior circulation: The intracranial vertebral arteries are patent. Calcified plaque results in severe stenosis within the right V4 segment. The basilar artery  is patent without significant stenosis. The posterior cerebral arteries are patent bilaterally. Moderate/severe stenosis within a P3 left  PCA branch (series 13, image 32). Posterior communicating arteries are hypoplastic or absent bilaterally. Venous sinuses: Within limitations of contrast timing, no convincing thrombus. Anatomic variants: As described. Review of the MIP images confirms the above findings IMPRESSION: CT head: Unremarkable non-contrast CT appearance of the brain. No evidence of acute intracranial abnormality. CTA neck: 1. The right common and internal carotid arteries are patent within the neck. There is somewhat prominent calcified plaque within the carotid bifurcation and proximal ICA. Apparent stenosis of the proximal right ICA of up to 50-60%, although this stenosis may be overestimated due to blooming artifact from irregular calcified plaque at this site. Carotid artery duplex may be obtained for further evaluation, as clinically warranted. 2. The left common and internal carotids are patent within the neck, as is the left carotid stent. Artifact limits evaluation for intra-stent stenosis. 3. The right vertebral artery is patent. here are sites of moderate/severe atherosclerotic narrowing within the V1 and proximal V2 segments. Additionally, there is mild/moderate atherosclerotic narrowing of this vessel at the C1-C2 level. 4. The left vertebral artery is patent. Severe atherosclerotic narrowing at the origin of this vessel. 5. Cervical spondylosis as described and greatest at C3-C4 where there is suspected at least moderate spinal canal stenosis. CTA head: 1. No intracranial large vessel occlusion. 2. Intracranial atherosclerotic disease with multifocal stenoses, most notably as follows. 3. Sites of up to moderate stenosis within the paraclinoid right ICA and cavernous left ICA. 4. High-grade stenosis within the V4 right vertebral artery. 5. Moderate/severe focal stenosis within a P3 left PCA branch vessel. Electronically Signed   By: Kellie Simmering DO   On: 10/31/2019 20:28   ECHOCARDIOGRAM COMPLETE  Result Date: 11/01/2019     ECHOCARDIOGRAM REPORT   Patient Name:   Paul [name]. Date of Exam: 11/01/2019 Medical Rec #:  [id number]         Height:       69.0 in Accession #:    [id number]        Weight:       185.0 lb Date of Birth:  12-25-1965          BSA:          1.999 m Patient Age:    28 years          BP:           80/65 mmHg Patient Gender: M                 HR:           74 bpm. Exam Location:  [facility] Procedure: 2D Echo, Cardiac Doppler and Color Doppler Indications:     ventricular Tachycardia 147.2  History:         Patient has prior history of Echocardiogram examinations, most                  recent 05/25/2018. Previous Myocardial Infarction, Stroke; Risk                  Factors:Sleep Apnea and Hypertension. PVD.  Sonographer:     Sherrie Sport RDCS (AE) Referring Phys:  [id number] Cleaster Corin PATEL Diagnosing Phys: Kate Sable MD IMPRESSIONS  1. Left ventricular ejection fraction, by estimation, is 25 to 30%. The left ventricle has severely decreased function. The left ventricle demonstrates global hypokinesis. Left ventricular diastolic parameters are consistent with  Grade I diastolic dysfunction (impaired relaxation).  2. Right ventricular systolic function is moderately reduced. The right ventricular size is normal. There is normal pulmonary artery systolic pressure.  3. The mitral valve is normal in structure. No evidence of mitral valve regurgitation.  4. The aortic valve is normal in structure. Aortic valve regurgitation is not visualized.  5. The inferior vena cava is normal in size with greater than 50% respiratory variability, suggesting right atrial pressure of 3 mmHg. FINDINGS  Left Ventricle: Left ventricular ejection fraction, by estimation, is 25 to 30%. The left ventricle has severely decreased function. The left ventricle demonstrates global hypokinesis. The left ventricular internal cavity size was normal in size. There is no left ventricular hypertrophy. Left ventricular diastolic parameters are consistent with  Grade I diastolic dysfunction (impaired relaxation). Right Ventricle: The right ventricular size is normal. No increase in right ventricular wall thickness. Right ventricular systolic function is moderately reduced. There is normal pulmonary artery systolic pressure. The tricuspid regurgitant velocity is 1.94 m/s, and with an assumed right atrial pressure of 3 mmHg, the estimated right ventricular systolic pressure is 24.2 mmHg. Left Atrium: Left atrial size was normal in size. Right Atrium: Right atrial size was normal in size. Pericardium: There is no evidence of pericardial effusion. Mitral Valve: The mitral valve is normal in structure. No evidence of mitral valve regurgitation. Tricuspid Valve: The tricuspid valve is grossly normal. Tricuspid valve regurgitation is not demonstrated. Aortic Valve: The aortic valve is normal in structure. Aortic valve regurgitation is not visualized. Aortic valve mean gradient measures 6.7 mmHg. Aortic valve peak gradient measures 12.7 mmHg. Aortic valve area, by VTI measures 1.72 cm. Pulmonic Valve: The pulmonic valve was not well visualized. Pulmonic valve regurgitation is not visualized. Aorta: The aortic root is normal in size and structure. Venous: The inferior vena cava is normal in size with greater than 50% respiratory variability, suggesting right atrial pressure of 3 mmHg. IAS/Shunts: No atrial level shunt detected by color flow Doppler. Additional Comments: A pacer wire is visualized.  LEFT VENTRICLE PLAX 2D LVIDd:         4.91 cm      Diastology LVIDs:         4.44 cm      LV e' lateral:   4.35 cm/s LV PW:         1.29 cm      LV E/e' lateral: 11.8 LV IVS:        1.08 cm      LV e' medial:    2.83 cm/s LVOT diam:     2.20 cm      LV E/e' medial:  18.2 LV SV:         46 LV SV Index:   23 LVOT Area:     3.80 cm  LV Volumes (MOD) LV vol d, MOD A2C: 136.0 ml LV vol d, MOD A4C: 126.0 ml LV vol s, MOD A2C: 98.3 ml LV vol s, MOD A4C: 73.4 ml LV SV MOD A2C:     37.7 ml LV  SV MOD A4C:     126.0 ml LV SV MOD BP:      47.8 ml RIGHT VENTRICLE RV Basal diam:  3.38 cm RV S prime:     7.51 cm/s TAPSE (M-mode): 3.5 cm LEFT ATRIUM           Index       RIGHT ATRIUM           Index LA  diam:      3.60 cm 1.80 cm/m  RA Area:     14.70 cm LA Vol (A2C): 37.3 ml 18.66 ml/m RA Volume:   33.80 ml  16.91 ml/m LA Vol (A4C): 45.0 ml 22.52 ml/m  AORTIC VALVE                    PULMONIC VALVE AV Area (Vmax):    1.37 cm     PV Vmax:        0.79 m/s AV Area (Vmean):   1.29 cm     PV Peak grad:   2.5 mmHg AV Area (VTI):     1.72 cm     RVOT Peak grad: 2 mmHg AV Vmax:           178.00 cm/s AV Vmean:          113.667 cm/s AV VTI:            0.268 m AV Peak Grad:      12.7 mmHg AV Mean Grad:      6.7 mmHg LVOT Vmax:         64.30 cm/s LVOT Vmean:        38.500 cm/s LVOT VTI:          0.121 m LVOT/AV VTI ratio: 0.45  AORTA Ao Root diam: 2.70 cm MITRAL VALVE               TRICUSPID VALVE MV Area (PHT): 4.24 cm    TR Peak grad:   15.1 mmHg MV Decel Time: 179 msec    TR Vmax:        194.00 cm/s MV E velocity: 51.40 cm/s MV A velocity: 77.60 cm/s  SHUNTS MV E/A ratio:  0.66        Systemic VTI:  0.12 m                            Systemic Diam: 2.20 cm Kate Sable MD Electronically signed by Kate Sable MD Signature Date/Time: 11/01/2019/3:00:04 PM    Final           Assessment and Plan:   1.  Syncope, polymorphic VT -Increase amiodarone to 200 mg daily -Echocardiogram with severely reduced EF, 25 to 30%. -Replete electrolytes to keep K over 4, mag over 2 -We will have electrophysiology evaluate patient in the morning for additional input if any regarding ICD therapeutic zone adjustments.  2.  Ischemic cardiomyopathy, EF 25 to 30% -Low blood pressures preventing heart failure medicines. -Volume being controlled per hemodialysis  3.  Hypotension -Start midodrine 10 mg Monday Wednesday Friday with dialysis sessions. -Up titration as per renal  4.  History of CAD/CABG, PCI of  SVG -Continue Plavix -Patient not on statin.  Will start lipitor 40mg  qd -Lipid panel in the a.m.  Signed, Kate Sable, MD  11/01/2019 4:46 PM

## 2019-11-01 NOTE — Progress Notes (Signed)
[facility]  ROUNDING NOTE   Subjective:   [name] admitted to [facility] on 10/31/2019 for Syncope [R55] Ventricular tachycardia ([place]) [I47.2] NSTEMI (non-ST elevated myocardial infarction) ([place]) [I21.4] ESRD on hemodialysis ([place]) [N18.6, Z99.2] Syncope, unspecified syncope type [R55] Type 2 diabetes mellitus without complication, without long-term current use of insulin ([place]) [E11.9] V tach ([place]) [I47.2]    HEMODIALYSIS FLOWSHEET:  Blood Flow Rate (mL/min): 400 mL/min Arterial Pressure (mmHg): -250 mmHg Venous Pressure (mmHg): 240 mmHg Transmembrane Pressure (mmHg): 40 mmHg Ultrafiltration Rate (mL/min): 570 mL/min Dialysate Flow Rate (mL/min): 600 ml/min Conductivity: Machine : 14.1 Conductivity: Machine : 14.1 Dialysis Fluid Bolus: Normal Saline Bolus Amount (mL): 250 mL    Objective:  Vital signs in last 24 hours:  Temp:  [97.7 F (36.5 C)-98.3 F (36.8 C)] 97.7 F (36.5 C) (06/30 0853) Pulse Rate:  [72-93] 89 (06/30 0945) Resp:  [13-25] 15 (06/30 1130) BP: (71-150)/(32-90) 80/65 (06/30 1130) SpO2:  [87 %-100 %] 87 % (06/30 0930) Weight:  [83.9 kg] 83.9 kg (06/29 1700)  Weight change:  Filed Weights   10/31/19 1700  Weight: 83.9 kg    Intake/Output: I/O last 3 completed shifts: In: 512.2 [P.O.:360; I.V.:111; IV Piggyback:41.2] Out: -    Intake/Output this shift:  No intake/output data recorded.  Physical Exam: General: NAD,   Head: Normocephalic, atraumatic. Moist oral mucosal membranes  Eyes: Anicteric, PERRL  Neck: Supple, trachea midline  Lungs:  Clear to auscultation  Heart: Regular rate and rhythm  Abdomen:  Soft, nontender,   Extremities:  Right BKA, trace edema  Neurologic: Nonfocal, moving all four extremities  Skin: No lesions  Access: RIJ permcath    Basic Metabolic Panel: Recent Labs  Lab 10/31/19 1745 11/01/19 0442  NA 136 134*  K 3.5 3.6  CL 99 99  CO2 21* 23  GLUCOSE 230* 159*  BUN 48* 54*   CREATININE 10.56* 11.28*  CALCIUM 7.6* 7.4*  MG 2.2  --     Liver Function Tests: Recent Labs  Lab 10/31/19 1745  AST 15  ALT 16  ALKPHOS 89  BILITOT 0.6  PROT 6.9  ALBUMIN 3.5   No results for input(s): LIPASE, AMYLASE in the last 168 hours. No results for input(s): AMMONIA in the last 168 hours.  CBC: Recent Labs  Lab 10/31/19 1745 11/01/19 0442  WBC 13.7* 12.8*  NEUTROABS 11.2*  --   HGB 12.5* 12.2*  HCT 38.6* 36.2*  MCV 97.0 95.0  PLT 285 266    Cardiac Enzymes: No results for input(s): CKTOTAL, CKMB, CKMBINDEX, TROPONINI in the last 168 hours.  BNP: Invalid input(s): POCBNP  CBG: Recent Labs  Lab 11/01/19 0759 11/01/19 1233  GLUCAP 106* 81    Microbiology: Results for orders placed or performed during the hospital encounter of 10/31/19  SARS Coronavirus 2 by RT PCR (hospital order, performed in [facility] hospital lab) Nasopharyngeal Nasopharyngeal Swab     Status: None   Collection Time: 10/31/19  7:28 PM   Specimen: Nasopharyngeal Swab  Result Value Ref Range Status   SARS Coronavirus 2 NEGATIVE NEGATIVE Final    Comment: (NOTE) SARS-CoV-2 target nucleic acids are NOT DETECTED.  The SARS-CoV-2 RNA is generally detectable in upper and lower respiratory specimens during the acute phase of infection. The lowest concentration of SARS-CoV-2 viral copies this assay can detect is 250 copies / mL. A negative result does not preclude SARS-CoV-2 infection and should not be used as the sole basis for treatment or  other patient management decisions.  A negative result may occur with improper specimen collection / handling, submission of specimen other than nasopharyngeal swab, presence of viral mutation(s) within the areas targeted by this assay, and inadequate number of viral copies (<250 copies / mL). A negative result must be combined with clinical observations, patient history, and epidemiological information.  Fact Sheet for Patients:    StrictlyIdeas.no  Fact Sheet for Healthcare Providers: BankingDealers.co.za  This test is not yet approved or  cleared by the [place] FDA and has been authorized for detection and/or diagnosis of SARS-CoV-2 by FDA under an Emergency Use Authorization (EUA).  This EUA will remain in effect (meaning this test can be used) for the duration of the COVID-19 declaration under Section 564(b)(1) of the Act, 21 U.S.C. section 360bbb-3(b)(1), unless the authorization is terminated or revoked sooner.  Performed at [facility], [address] [place], [place] [address]     Coagulation Studies: Recent Labs    10/31/19 1745  LABPROT 12.5  INR 1.0    Urinalysis: No results for input(s): COLORURINE, LABSPEC, PHURINE, GLUCOSEU, HGBUR, BILIRUBINUR, KETONESUR, PROTEINUR, UROBILINOGEN, NITRITE, LEUKOCYTESUR in the last 72 hours.  Invalid input(s): APPERANCEUR    Imaging: CT Angio Head W or Wo Contrast  Result Date: 10/31/2019 CLINICAL DATA:  Headache, sudden, carotid/vertebral dissection suspected. Additional history provided: Syncopal episode patient reports he awoke in his wheelchair with headache and dizziness as well as sweating. EXAM: CT ANGIOGRAPHY HEAD AND NECK TECHNIQUE: Multidetector CT imaging of the head and neck was performed using the standard protocol during bolus administration of intravenous contrast. Multiplanar CT image reconstructions and MIPs were obtained to evaluate the vascular anatomy. Carotid stenosis measurements (when applicable) are obtained utilizing NASCET criteria, using the distal internal carotid diameter as the denominator. CONTRAST:  79mL OMNIPAQUE IOHEXOL 350 MG/ML SOLN COMPARISON:  Head CT 10/23/2019, report from CT angiogram neck 07/24/2008 (images unavailable), report from carotid artery duplex 09/07/2018. FINDINGS: CT HEAD FINDINGS Brain: Cerebral volume is normal for age. There is no acute  intracranial hemorrhage. No demarcated cortical infarct is identified. No extra-axial fluid collection. No evidence of intracranial mass. No midline shift. Vascular: Reported below. Skull: Normal. Negative for fracture or focal lesion. Sinuses: No significant paranasal sinus disease or mastoid effusion at the imaged levels. Orbits: No acute abnormality. Review of the MIP images confirms the above findings CTA NECK FINDINGS Aortic arch: Common origin of the innominate and left common carotid arteries. Atherosclerotic plaque within the visualized aortic arch and proximal major branch vessels of the neck. No hemodynamically significant innominate or proximal subclavian artery stenosis. Right carotid system: CCA and ICA patent within the neck. Mild scattered mixed plaque within the CCA. There is more prominent calcified plaque within the carotid bifurcation and proximal ICA. There is stenosis of the proximal ICA estimated at up to 50-60%, although this may be overestimated on the current exam due to blooming artifact from irregular calcified plaque. Left carotid system: The CCA and ICA are patent. There is a stent extending from the distal CCA into the proximal ICA. There is no hemodynamically significant stenosis within the CCA or cervical ICA proximal or distal to the stent. The stent is patent, although artifact limits evaluation for intra-stent stenosis. Vertebral arteries: The vertebral arteries are codominant. There is fairly prominent multifocal mixed plaque within the V1 and proximal V2 right vertebral artery with sites of moderate/severe stenosis. There is also calcified plaque within the distal cervical right vertebral artery at the C1-C2 level  with up to mild/moderate stenosis at this site. The left vertebral artery is patent throughout the neck with high-grade stenosis at its origin. Skeleton: No acute bony abnormality or aggressive osseous lesion. Cervical spondylosis. Most notably at C3-C4, there is  advanced disc space narrowing and a disc bulge with osteophyte ridge contributes to suspected at least moderate spinal canal stenosis. Other neck: No neck mass or cervical lymphadenopathy. Thyroid unremarkable. Upper chest: No consolidation within the imaged lung apices. Prior median sternotomy. Partially imaged left chest multi lead AICD device. Partially imaged right IJ approach central venous catheter. Review of the MIP images confirms the above findings CTA HEAD FINDINGS Anterior circulation: The intracranial internal carotid arteries are patent. Calcified plaque within both vessels. Sites of up to moderate stenosis within the paraclinoid right ICA. Sites of moderate stenosis within the cavernous left ICA. The M1 middle cerebral arteries are patent without significant stenosis. No M2 proximal branch occlusion or high-grade proximal arterial stenosis is identified. The anterior cerebral arteries are patent without significant proximal stenosis. No intracranial aneurysm is identified. Posterior circulation: The intracranial vertebral arteries are patent. Calcified plaque results in severe stenosis within the right V4 segment. The basilar artery is patent without significant stenosis. The posterior cerebral arteries are patent bilaterally. Moderate/severe stenosis within a P3 left PCA branch (series 13, image 32). Posterior communicating arteries are hypoplastic or absent bilaterally. Venous sinuses: Within limitations of contrast timing, no convincing thrombus. Anatomic variants: As described. Review of the MIP images confirms the above findings IMPRESSION: CT head: Unremarkable non-contrast CT appearance of the brain. No evidence of acute intracranial abnormality. CTA neck: 1. The right common and internal carotid arteries are patent within the neck. There is somewhat prominent calcified plaque within the carotid bifurcation and proximal ICA. Apparent stenosis of the proximal right ICA of up to 50-60%, although  this stenosis may be overestimated due to blooming artifact from irregular calcified plaque at this site. Carotid artery duplex may be obtained for further evaluation, as clinically warranted. 2. The left common and internal carotids are patent within the neck, as is the left carotid stent. Artifact limits evaluation for intra-stent stenosis. 3. The right vertebral artery is patent. here are sites of moderate/severe atherosclerotic narrowing within the V1 and proximal V2 segments. Additionally, there is mild/moderate atherosclerotic narrowing of this vessel at the C1-C2 level. 4. The left vertebral artery is patent. Severe atherosclerotic narrowing at the origin of this vessel. 5. Cervical spondylosis as described and greatest at C3-C4 where there is suspected at least moderate spinal canal stenosis. CTA head: 1. No intracranial large vessel occlusion. 2. Intracranial atherosclerotic disease with multifocal stenoses, most notably as follows. 3. Sites of up to moderate stenosis within the paraclinoid right ICA and cavernous left ICA. 4. High-grade stenosis within the V4 right vertebral artery. 5. Moderate/severe focal stenosis within a P3 left PCA branch vessel. Electronically Signed   By: Kellie Simmering DO   On: 10/31/2019 20:28   DG Chest 2 View  Result Date: 10/31/2019 CLINICAL DATA:  Syncope. EXAM: CHEST - 2 VIEW COMPARISON:  04/10/2019 FINDINGS: There is a well-positioned right-sided tunneled dialysis catheter. The heart size is stable but enlarged. The patient is status post prior median sternotomy. There is a dual chamber left-sided pacemaker/ICD with stable positioning of the leads. There is no pneumothorax. No significant pleural effusion. No focal infiltrate. There is no acute osseous abnormality. IMPRESSION: No active cardiopulmonary disease. Electronically Signed   By: Constance Holster M.D.   On: 10/31/2019  21:33   CT Angio Neck W and/or Wo Contrast  Result Date: 10/31/2019 CLINICAL DATA:   Headache, sudden, carotid/vertebral dissection suspected. Additional history provided: Syncopal episode patient reports he awoke in his wheelchair with headache and dizziness as well as sweating. EXAM: CT ANGIOGRAPHY HEAD AND NECK TECHNIQUE: Multidetector CT imaging of the head and neck was performed using the standard protocol during bolus administration of intravenous contrast. Multiplanar CT image reconstructions and MIPs were obtained to evaluate the vascular anatomy. Carotid stenosis measurements (when applicable) are obtained utilizing NASCET criteria, using the distal internal carotid diameter as the denominator. CONTRAST:  89mL OMNIPAQUE IOHEXOL 350 MG/ML SOLN COMPARISON:  Head CT 10/23/2019, report from CT angiogram neck 07/24/2008 (images unavailable), report from carotid artery duplex 09/07/2018. FINDINGS: CT HEAD FINDINGS Brain: Cerebral volume is normal for age. There is no acute intracranial hemorrhage. No demarcated cortical infarct is identified. No extra-axial fluid collection. No evidence of intracranial mass. No midline shift. Vascular: Reported below. Skull: Normal. Negative for fracture or focal lesion. Sinuses: No significant paranasal sinus disease or mastoid effusion at the imaged levels. Orbits: No acute abnormality. Review of the MIP images confirms the above findings CTA NECK FINDINGS Aortic arch: Common origin of the innominate and left common carotid arteries. Atherosclerotic plaque within the visualized aortic arch and proximal major branch vessels of the neck. No hemodynamically significant innominate or proximal subclavian artery stenosis. Right carotid system: CCA and ICA patent within the neck. Mild scattered mixed plaque within the CCA. There is more prominent calcified plaque within the carotid bifurcation and proximal ICA. There is stenosis of the proximal ICA estimated at up to 50-60%, although this may be overestimated on the current exam due to blooming artifact from irregular  calcified plaque. Left carotid system: The CCA and ICA are patent. There is a stent extending from the distal CCA into the proximal ICA. There is no hemodynamically significant stenosis within the CCA or cervical ICA proximal or distal to the stent. The stent is patent, although artifact limits evaluation for intra-stent stenosis. Vertebral arteries: The vertebral arteries are codominant. There is fairly prominent multifocal mixed plaque within the V1 and proximal V2 right vertebral artery with sites of moderate/severe stenosis. There is also calcified plaque within the distal cervical right vertebral artery at the C1-C2 level with up to mild/moderate stenosis at this site. The left vertebral artery is patent throughout the neck with high-grade stenosis at its origin. Skeleton: No acute bony abnormality or aggressive osseous lesion. Cervical spondylosis. Most notably at C3-C4, there is advanced disc space narrowing and a disc bulge with osteophyte ridge contributes to suspected at least moderate spinal canal stenosis. Other neck: No neck mass or cervical lymphadenopathy. Thyroid unremarkable. Upper chest: No consolidation within the imaged lung apices. Prior median sternotomy. Partially imaged left chest multi lead AICD device. Partially imaged right IJ approach central venous catheter. Review of the MIP images confirms the above findings CTA HEAD FINDINGS Anterior circulation: The intracranial internal carotid arteries are patent. Calcified plaque within both vessels. Sites of up to moderate stenosis within the paraclinoid right ICA. Sites of moderate stenosis within the cavernous left ICA. The M1 middle cerebral arteries are patent without significant stenosis. No M2 proximal branch occlusion or high-grade proximal arterial stenosis is identified. The anterior cerebral arteries are patent without significant proximal stenosis. No intracranial aneurysm is identified. Posterior circulation: The intracranial  vertebral arteries are patent. Calcified plaque results in severe stenosis within the right V4 segment. The basilar artery is  patent without significant stenosis. The posterior cerebral arteries are patent bilaterally. Moderate/severe stenosis within a P3 left PCA branch (series 13, image 32). Posterior communicating arteries are hypoplastic or absent bilaterally. Venous sinuses: Within limitations of contrast timing, no convincing thrombus. Anatomic variants: As described. Review of the MIP images confirms the above findings IMPRESSION: CT head: Unremarkable non-contrast CT appearance of the brain. No evidence of acute intracranial abnormality. CTA neck: 1. The right common and internal carotid arteries are patent within the neck. There is somewhat prominent calcified plaque within the carotid bifurcation and proximal ICA. Apparent stenosis of the proximal right ICA of up to 50-60%, although this stenosis may be overestimated due to blooming artifact from irregular calcified plaque at this site. Carotid artery duplex may be obtained for further evaluation, as clinically warranted. 2. The left common and internal carotids are patent within the neck, as is the left carotid stent. Artifact limits evaluation for intra-stent stenosis. 3. The right vertebral artery is patent. here are sites of moderate/severe atherosclerotic narrowing within the V1 and proximal V2 segments. Additionally, there is mild/moderate atherosclerotic narrowing of this vessel at the C1-C2 level. 4. The left vertebral artery is patent. Severe atherosclerotic narrowing at the origin of this vessel. 5. Cervical spondylosis as described and greatest at C3-C4 where there is suspected at least moderate spinal canal stenosis. CTA head: 1. No intracranial large vessel occlusion. 2. Intracranial atherosclerotic disease with multifocal stenoses, most notably as follows. 3. Sites of up to moderate stenosis within the paraclinoid right ICA and cavernous left  ICA. 4. High-grade stenosis within the V4 right vertebral artery. 5. Moderate/severe focal stenosis within a P3 left PCA branch vessel. Electronically Signed   By: Kellie Simmering DO   On: 10/31/2019 20:28   ECHOCARDIOGRAM COMPLETE  Result Date: 11/01/2019    ECHOCARDIOGRAM REPORT   Patient Name:   Paul [name]. Date of Exam: 11/01/2019 Medical Rec #:  [id number]         Height:       69.0 in Accession #:    [id number]        Weight:       185.0 lb Date of Birth:  05/09/1965          BSA:          1.999 m Patient Age:    54 years          BP:           80/65 mmHg Patient Gender: M                 HR:           74 bpm. Exam Location:  [facility] Procedure: 2D Echo, Cardiac Doppler and Color Doppler Indications:     ventricular Tachycardia 147.2  History:         Patient has prior history of Echocardiogram examinations, most                  recent 05/25/2018. Previous Myocardial Infarction, Stroke; Risk                  Factors:Sleep Apnea and Hypertension. PVD.  Sonographer:     Sherrie Sport RDCS (AE) Referring Phys:  [id number] Cleaster Corin PATEL Diagnosing Phys: Kate Sable MD IMPRESSIONS  1. Left ventricular ejection fraction, by estimation, is 25 to 30%. The left ventricle has severely decreased function. The left ventricle demonstrates global hypokinesis. Left ventricular diastolic parameters are consistent with Grade I  diastolic dysfunction (impaired relaxation).  2. Right ventricular systolic function is moderately reduced. The right ventricular size is normal. There is normal pulmonary artery systolic pressure.  3. The mitral valve is normal in structure. No evidence of mitral valve regurgitation.  4. The aortic valve is normal in structure. Aortic valve regurgitation is not visualized.  5. The inferior vena cava is normal in size with greater than 50% respiratory variability, suggesting right atrial pressure of 3 mmHg. FINDINGS  Left Ventricle: Left ventricular ejection fraction, by estimation, is 25 to 30%. The  left ventricle has severely decreased function. The left ventricle demonstrates global hypokinesis. The left ventricular internal cavity size was normal in size. There is no left ventricular hypertrophy. Left ventricular diastolic parameters are consistent with Grade I diastolic dysfunction (impaired relaxation). Right Ventricle: The right ventricular size is normal. No increase in right ventricular wall thickness. Right ventricular systolic function is moderately reduced. There is normal pulmonary artery systolic pressure. The tricuspid regurgitant velocity is 1.94 m/s, and with an assumed right atrial pressure of 3 mmHg, the estimated right ventricular systolic pressure is 16.1 mmHg. Left Atrium: Left atrial size was normal in size. Right Atrium: Right atrial size was normal in size. Pericardium: There is no evidence of pericardial effusion. Mitral Valve: The mitral valve is normal in structure. No evidence of mitral valve regurgitation. Tricuspid Valve: The tricuspid valve is grossly normal. Tricuspid valve regurgitation is not demonstrated. Aortic Valve: The aortic valve is normal in structure. Aortic valve regurgitation is not visualized. Aortic valve mean gradient measures 6.7 mmHg. Aortic valve peak gradient measures 12.7 mmHg. Aortic valve area, by VTI measures 1.72 cm. Pulmonic Valve: The pulmonic valve was not well visualized. Pulmonic valve regurgitation is not visualized. Aorta: The aortic root is normal in size and structure. Venous: The inferior vena cava is normal in size with greater than 50% respiratory variability, suggesting right atrial pressure of 3 mmHg. IAS/Shunts: No atrial level shunt detected by color flow Doppler. Additional Comments: A pacer wire is visualized.  LEFT VENTRICLE PLAX 2D LVIDd:         4.91 cm      Diastology LVIDs:         4.44 cm      LV e' lateral:   4.35 cm/s LV PW:         1.29 cm      LV E/e' lateral: 11.8 LV IVS:        1.08 cm      LV e' medial:    2.83 cm/s LVOT  diam:     2.20 cm      LV E/e' medial:  18.2 LV SV:         46 LV SV Index:   23 LVOT Area:     3.80 cm  LV Volumes (MOD) LV vol d, MOD A2C: 136.0 ml LV vol d, MOD A4C: 126.0 ml LV vol s, MOD A2C: 98.3 ml LV vol s, MOD A4C: 73.4 ml LV SV MOD A2C:     37.7 ml LV SV MOD A4C:     126.0 ml LV SV MOD BP:      47.8 ml RIGHT VENTRICLE RV Basal diam:  3.38 cm RV S prime:     7.51 cm/s TAPSE (M-mode): 3.5 cm LEFT ATRIUM           Index       RIGHT ATRIUM           Index LA diam:  3.60 cm 1.80 cm/m  RA Area:     14.70 cm LA Vol (A2C): 37.3 ml 18.66 ml/m RA Volume:   33.80 ml  16.91 ml/m LA Vol (A4C): 45.0 ml 22.52 ml/m  AORTIC VALVE                    PULMONIC VALVE AV Area (Vmax):    1.37 cm     PV Vmax:        0.79 m/s AV Area (Vmean):   1.29 cm     PV Peak grad:   2.5 mmHg AV Area (VTI):     1.72 cm     RVOT Peak grad: 2 mmHg AV Vmax:           178.00 cm/s AV Vmean:          113.667 cm/s AV VTI:            0.268 m AV Peak Grad:      12.7 mmHg AV Mean Grad:      6.7 mmHg LVOT Vmax:         64.30 cm/s LVOT Vmean:        38.500 cm/s LVOT VTI:          0.121 m LVOT/AV VTI ratio: 0.45  AORTA Ao Root diam: 2.70 cm MITRAL VALVE               TRICUSPID VALVE MV Area (PHT): 4.24 cm    TR Peak grad:   15.1 mmHg MV Decel Time: 179 msec    TR Vmax:        194.00 cm/s MV E velocity: 51.40 cm/s MV A velocity: 77.60 cm/s  SHUNTS MV E/A ratio:  0.66        Systemic VTI:  0.12 m                            Systemic Diam: 2.20 cm Kate Sable MD Electronically signed by Kate Sable MD Signature Date/Time: 11/01/2019/3:00:04 PM    Final      Medications:   . sodium chloride Stopped (11/01/19 0234)   . amiodarone  100 mg Oral Daily  . Chlorhexidine Gluconate Cloth  6 each Topical Q0600  . clopidogrel  75 mg Oral Q breakfast  . doxycycline  100 mg Oral Q12H  . heparin  5,000 Units Subcutaneous Q8H  . insulin aspart  0-6 Units Subcutaneous TID WC  . predniSONE  5 mg Oral BID WC  . predniSONE  5 mg Oral Once   . sodium chloride flush  3 mL Intravenous Q12H   sodium chloride, acetaminophen **OR** acetaminophen, oxyCODONE  Assessment/ Plan:  Mr. Bayler Gehrig. is a 54 y.o. black male with end stage renal disease, type 2 diabetes mellitus insulin dependent, diabetic neuropathy, hypertension, coronary artery disease status post CABG, systolic and diastolic congestive heart failure, status post AICD, CVA, peripheral vascular disease status post right BKA who is admitted to [facility] on 10/31/2019 for Syncope [R55] Ventricular tachycardia ([place]) [I47.2] NSTEMI (non-ST elevated myocardial infarction) ([place]) [I21.4] ESRD on hemodialysis ([place]) [N18.6, Z99.2] Syncope, unspecified syncope type [R55] Type 2 diabetes mellitus without complication, without long-term current use of insulin ([place]) [E11.9] V tach ([place]) [I47.2]  [facility] MWF RIJ permcath 86kg  1. End stage renal disease on hemodialysis: emergent hemodialysis treatment this morning.   2. Hypotension: not currently on midodrine.   3. Anemia with chronic kidney  disease: EPO with outpatient HD treatments.   4. Secondary Hyperparathyroidism:  - calcium acetate with meals.    LOS: 0 [place] Severt 6/30/20213:07 PM

## 2019-11-01 NOTE — Progress Notes (Signed)
*  PRELIMINARY RESULTS* Echocardiogram 2D Echocardiogram has been performed.  Sherrie Sport 11/01/2019, 2:31 PM

## 2019-11-01 NOTE — Progress Notes (Addendum)
PROGRESS NOTE    Paul [name].  MWU:[id number] DOB: 12-Jun-1965 DOA: 10/31/2019 PCP: Tracie Harrier, MD   Chief complaint.  Syncope.   Brief Narrative: Paul [name]. is a 54 y.o. male with medical history significant for CAD s/p CABG in 2010, ischemic cardiomyopathy, HFrEF (EF 25-30%), s/p AICD, ESRD on MWF HD, type 2 diabetes, paroxysmal atrial fibrillation (not currently on anticoagulation), history of CVA, carotid artery disease s/p LICA stenting, PVD s/p right BKA, hypotension, anemia of chronic disease, and DJD with chronic low back pain who presents to the ED for evaluation after a syncopal episode.  In the emergency room, patient had a ventricular tachycardia of 150, lasted about 5 minutes.  Did not trigger her AICD firing.  Patient had a CT angiogram of the head, showed  multifocal stenoses noted with moderate stenosis within paraclinoid right ICA cavernous left ICA, high-grade stenosis within V4 right vertebral artery, moderate/severe focal stenosis within P3 left PCA branch vessel. ICD interrogation showed abnormal polymorphic ventricular tachycardia.  Cardiology consult was obtained.  Patient was given amiodarone.  Assessment & Plan:   Principal Problem:   Syncope Active Problems:   Diabetes (HCC)   CAD (coronary artery disease)   Chronic systolic CHF (congestive heart failure) (HCC)   ESRD on dialysis ([place])   AICD (automatic cardioverter/defibrillator) present   PAF (paroxysmal atrial fibrillation) (HCC)   Elevated troponin   Ventricular tachycardia ([place])  #1.  Syncope secondary to ventricular tachycardia with chronic systolic congestive heart failure ejection fraction 25 to 30%. Cardiology will see the patient formally today.  Continue amiodarone.  Blood pressure is low after hemodialysis, I will continue to hold beta-blocker for now.  2.  Elevated troponin. Likely secondary to ventricular tachycardia.  Pending cardiology consult.  3.  End-stage renal disease  on hemodialysis. Patient was dialyzed again today.  Blood pressure was low again during the hemodialysis.  Patient was chronically on prednisone 5 mg twice a day, probably has chronic adrenal insufficiency due to long-term steroid use.  Resume steroids.  4.  Hypotension. As above.  5.  Paroxysmal atrial fibrillation. Not currently on anticoagulation.  6.  Type 2 diabetes. Continue current regimen.  7.  Anemia of chronic disease. Stable.  Continue to follow.  1715. BP low, ordered 567m NS bolus.   DVT prophylaxis: Heparin SQ Code Status:Full Family Communication: None Disposition Plan:  . Patient came from: Home           . Anticipated d/c place:Home . Barriers to d/c OR conditions which need to be met to effect a safe d/c:   Consultants:   Cardiology  nephrology  Procedures:HD Antimicrobials: None  Subjective: Patient just finished hemodialysis, currently feels well.  No dizziness or lightheadedness.  No palpitation.  Denies any short of breath or cough.   Objective: Vitals:   11/01/19 1045 11/01/19 1100 11/01/19 1115 11/01/19 1130  BP: (!) 71/32 (!) 86/63 (!) 94/50 (!) 80/65  Pulse:      Resp: 16 (!) _0 Temp:      TempSrc:      SpO2:      Weight:      Height:        Intake/Output Summary (Last 24 hours) at 11/01/2019 1317 Last data filed at 11/01/2019 0600 Gross per 24 hour  Intake 512.21 ml  Output --  Net 512.21 ml   Filed Weights   10/31/19 1700  Weight: 83.9 kg    Examination:  General exam: Appears  calm and comfortable  Respiratory system: Clear to auscultation. Respiratory effort normal. Cardiovascular system: S1 & S2 heard, RRR. No JVD, murmurs, rubs, gallops or clicks. No pedal edema. Gastrointestinal system: Abdomen is nondistended, soft and nontender. No organomegaly or masses felt. Normal bowel sounds heard. Central nervous system: Alert and oriented. No focal neurological deficits. Extremities: Right BKA Skin: No rashes,  lesions or ulcers Psychiatry: Judgement and insight appear normal. Mood & affect appropriate.     Data Reviewed: I have personally reviewed following labs and imaging studies  CBC: Recent Labs  Lab 10/31/19 1745 11/01/19 0442  WBC 13.7* 12.8*  NEUTROABS 11.2*  --   HGB 12.5* 12.2*  HCT 38.6* 36.2*  MCV 97.0 95.0  PLT 285 258   Basic Metabolic Panel: Recent Labs  Lab 10/31/19 1745 11/01/19 0442  NA 136 134*  K 3.5 3.6  CL 99 99  CO2 21* 23  GLUCOSE 230* 159*  BUN 48* 54*  CREATININE 10.56* 11.28*  CALCIUM 7.6* 7.4*  MG 2.2  --    GFR: Estimated Creatinine Clearance: 7.5 mL/min (A) (by C-G formula based on SCr of 11.28 mg/dL (H)). Liver Function Tests: Recent Labs  Lab 10/31/19 1745  AST 15  ALT 16  ALKPHOS 89  BILITOT 0.6  PROT 6.9  ALBUMIN 3.5   No results for input(s): LIPASE, AMYLASE in the last 168 hours. No results for input(s): AMMONIA in the last 168 hours. Coagulation Profile: Recent Labs  Lab 10/31/19 1745  INR 1.0   Cardiac Enzymes: No results for input(s): CKTOTAL, CKMB, CKMBINDEX, TROPONINI in the last 168 hours. BNP (last 3 results) No results for input(s): PROBNP in the last 8760 hours. HbA1C: No results for input(s): HGBA1C in the last 72 hours. CBG: Recent Labs  Lab 11/01/19 0759 11/01/19 1233  GLUCAP 106* 81   Lipid Profile: No results for input(s): CHOL, HDL, LDLCALC, TRIG, CHOLHDL, LDLDIRECT in the last 72 hours. Thyroid Function Tests: No results for input(s): TSH, T4TOTAL, FREET4, T3FREE, THYROIDAB in the last 72 hours. Anemia Panel: No results for input(s): VITAMINB12, FOLATE, FERRITIN, TIBC, IRON, RETICCTPCT in the last 72 hours. Sepsis Labs: No results for input(s): PROCALCITON, LATICACIDVEN in the last 168 hours.  Recent Results (from the past 240 hour(s))  SARS Coronavirus 2 by RT PCR (hospital order, performed in [facility] hospital lab) Nasopharyngeal Nasopharyngeal Swab     Status: None   Collection Time:  10/31/19  7:28 PM   Specimen: Nasopharyngeal Swab  Result Value Ref Range Status   SARS Coronavirus 2 NEGATIVE NEGATIVE Final    Comment: (NOTE) SARS-CoV-2 target nucleic acids are NOT DETECTED.  The SARS-CoV-2 RNA is generally detectable in upper and lower respiratory specimens during the acute phase of infection. The lowest concentration of SARS-CoV-2 viral copies this assay can detect is 250 copies / mL. A negative result does not preclude SARS-CoV-2 infection and should not be used as the sole basis for treatment or other patient management decisions.  A negative result may occur with improper specimen collection / handling, submission of specimen other than nasopharyngeal swab, presence of viral mutation(s) within the areas targeted by this assay, and inadequate number of viral copies (<250 copies / mL). A negative result must be combined with clinical observations, patient history, and epidemiological information.  Fact Sheet for Patients:   StrictlyIdeas.no  Fact Sheet for Healthcare Providers: BankingDealers.co.za  This test is not yet approved or  cleared by the [place] FDA and has been authorized for detection  and/or diagnosis of SARS-CoV-2 by FDA under an Emergency Use Authorization (EUA).  This EUA will remain in effect (meaning this test can be used) for the duration of the COVID-19 declaration under Section 564(b)(1) of the Act, 21 U.S.C. section 360bbb-3(b)(1), unless the authorization is terminated or revoked sooner.  Performed at [facility], [address], [place], [place] [address]          Radiology Studies: CT Angio Head W or Wo Contrast  Result Date: 10/31/2019 CLINICAL DATA:  Headache, sudden, carotid/vertebral dissection suspected. Additional history provided: Syncopal episode patient reports he awoke in his wheelchair with headache and dizziness as well as sweating. EXAM: CT  ANGIOGRAPHY HEAD AND NECK TECHNIQUE: Multidetector CT imaging of the head and neck was performed using the standard protocol during bolus administration of intravenous contrast. Multiplanar CT image reconstructions and MIPs were obtained to evaluate the vascular anatomy. Carotid stenosis measurements (when applicable) are obtained utilizing NASCET criteria, using the distal internal carotid diameter as the denominator. CONTRAST:  57m OMNIPAQUE IOHEXOL 350 MG/ML SOLN COMPARISON:  Head CT 10/23/2019, report from CT angiogram neck 07/24/2008 (images unavailable), report from carotid artery duplex 09/07/2018. FINDINGS: CT HEAD FINDINGS Brain: Cerebral volume is normal for age. There is no acute intracranial hemorrhage. No demarcated cortical infarct is identified. No extra-axial fluid collection. No evidence of intracranial mass. No midline shift. Vascular: Reported below. Skull: Normal. Negative for fracture or focal lesion. Sinuses: No significant paranasal sinus disease or mastoid effusion at the imaged levels. Orbits: No acute abnormality. Review of the MIP images confirms the above findings CTA NECK FINDINGS Aortic arch: Common origin of the innominate and left common carotid arteries. Atherosclerotic plaque within the visualized aortic arch and proximal major branch vessels of the neck. No hemodynamically significant innominate or proximal subclavian artery stenosis. Right carotid system: CCA and ICA patent within the neck. Mild scattered mixed plaque within the CCA. There is more prominent calcified plaque within the carotid bifurcation and proximal ICA. There is stenosis of the proximal ICA estimated at up to 50-60%, although this may be overestimated on the current exam due to blooming artifact from irregular calcified plaque. Left carotid system: The CCA and ICA are patent. There is a stent extending from the distal CCA into the proximal ICA. There is no hemodynamically significant stenosis within the CCA or  cervical ICA proximal or distal to the stent. The stent is patent, although artifact limits evaluation for intra-stent stenosis. Vertebral arteries: The vertebral arteries are codominant. There is fairly prominent multifocal mixed plaque within the V1 and proximal V2 right vertebral artery with sites of moderate/severe stenosis. There is also calcified plaque within the distal cervical right vertebral artery at the C1-C2 level with up to mild/moderate stenosis at this site. The left vertebral artery is patent throughout the neck with high-grade stenosis at its origin. Skeleton: No acute bony abnormality or aggressive osseous lesion. Cervical spondylosis. Most notably at C3-C4, there is advanced disc space narrowing and a disc bulge with osteophyte ridge contributes to suspected at least moderate spinal canal stenosis. Other neck: No neck mass or cervical lymphadenopathy. Thyroid unremarkable. Upper chest: No consolidation within the imaged lung apices. Prior median sternotomy. Partially imaged left chest multi lead AICD device. Partially imaged right IJ approach central venous catheter. Review of the MIP images confirms the above findings CTA HEAD FINDINGS Anterior circulation: The intracranial internal carotid arteries are patent. Calcified plaque within both vessels. Sites of up to moderate stenosis within the paraclinoid right  ICA. Sites of moderate stenosis within the cavernous left ICA. The M1 middle cerebral arteries are patent without significant stenosis. No M2 proximal branch occlusion or high-grade proximal arterial stenosis is identified. The anterior cerebral arteries are patent without significant proximal stenosis. No intracranial aneurysm is identified. Posterior circulation: The intracranial vertebral arteries are patent. Calcified plaque results in severe stenosis within the right V4 segment. The basilar artery is patent without significant stenosis. The posterior cerebral arteries are patent  bilaterally. Moderate/severe stenosis within a P3 left PCA branch (series 13, image 32). Posterior communicating arteries are hypoplastic or absent bilaterally. Venous sinuses: Within limitations of contrast timing, no convincing thrombus. Anatomic variants: As described. Review of the MIP images confirms the above findings IMPRESSION: CT head: Unremarkable non-contrast CT appearance of the brain. No evidence of acute intracranial abnormality. CTA neck: 1. The right common and internal carotid arteries are patent within the neck. There is somewhat prominent calcified plaque within the carotid bifurcation and proximal ICA. Apparent stenosis of the proximal right ICA of up to 50-60%, although this stenosis may be overestimated due to blooming artifact from irregular calcified plaque at this site. Carotid artery duplex may be obtained for further evaluation, as clinically warranted. 2. The left common and internal carotids are patent within the neck, as is the left carotid stent. Artifact limits evaluation for intra-stent stenosis. 3. The right vertebral artery is patent. here are sites of moderate/severe atherosclerotic narrowing within the V1 and proximal V2 segments. Additionally, there is mild/moderate atherosclerotic narrowing of this vessel at the C1-C2 level. 4. The left vertebral artery is patent. Severe atherosclerotic narrowing at the origin of this vessel. 5. Cervical spondylosis as described and greatest at C3-C4 where there is suspected at least moderate spinal canal stenosis. CTA head: 1. No intracranial large vessel occlusion. 2. Intracranial atherosclerotic disease with multifocal stenoses, most notably as follows. 3. Sites of up to moderate stenosis within the paraclinoid right ICA and cavernous left ICA. 4. High-grade stenosis within the V4 right vertebral artery. 5. Moderate/severe focal stenosis within a P3 left PCA branch vessel. Electronically Signed   By: Kellie Simmering DO   On: 10/31/2019 20:28    DG Chest 2 View  Result Date: 10/31/2019 CLINICAL DATA:  Syncope. EXAM: CHEST - 2 VIEW COMPARISON:  04/10/2019 FINDINGS: There is a well-positioned right-sided tunneled dialysis catheter. The heart size is stable but enlarged. The patient is status post prior median sternotomy. There is a dual chamber left-sided pacemaker/ICD with stable positioning of the leads. There is no pneumothorax. No significant pleural effusion. No focal infiltrate. There is no acute osseous abnormality. IMPRESSION: No active cardiopulmonary disease. Electronically Signed   By: Constance Holster M.D.   On: 10/31/2019 21:33   CT Angio Neck W and/or Wo Contrast  Result Date: 10/31/2019 CLINICAL DATA:  Headache, sudden, carotid/vertebral dissection suspected. Additional history provided: Syncopal episode patient reports he awoke in his wheelchair with headache and dizziness as well as sweating. EXAM: CT ANGIOGRAPHY HEAD AND NECK TECHNIQUE: Multidetector CT imaging of the head and neck was performed using the standard protocol during bolus administration of intravenous contrast. Multiplanar CT image reconstructions and MIPs were obtained to evaluate the vascular anatomy. Carotid stenosis measurements (when applicable) are obtained utilizing NASCET criteria, using the distal internal carotid diameter as the denominator. CONTRAST:  38m OMNIPAQUE IOHEXOL 350 MG/ML SOLN COMPARISON:  Head CT 10/23/2019, report from CT angiogram neck 07/24/2008 (images unavailable), report from carotid artery duplex 09/07/2018. FINDINGS: CT HEAD FINDINGS Brain: Cerebral  volume is normal for age. There is no acute intracranial hemorrhage. No demarcated cortical infarct is identified. No extra-axial fluid collection. No evidence of intracranial mass. No midline shift. Vascular: Reported below. Skull: Normal. Negative for fracture or focal lesion. Sinuses: No significant paranasal sinus disease or mastoid effusion at the imaged levels. Orbits: No acute  abnormality. Review of the MIP images confirms the above findings CTA NECK FINDINGS Aortic arch: Common origin of the innominate and left common carotid arteries. Atherosclerotic plaque within the visualized aortic arch and proximal major branch vessels of the neck. No hemodynamically significant innominate or proximal subclavian artery stenosis. Right carotid system: CCA and ICA patent within the neck. Mild scattered mixed plaque within the CCA. There is more prominent calcified plaque within the carotid bifurcation and proximal ICA. There is stenosis of the proximal ICA estimated at up to 50-60%, although this may be overestimated on the current exam due to blooming artifact from irregular calcified plaque. Left carotid system: The CCA and ICA are patent. There is a stent extending from the distal CCA into the proximal ICA. There is no hemodynamically significant stenosis within the CCA or cervical ICA proximal or distal to the stent. The stent is patent, although artifact limits evaluation for intra-stent stenosis. Vertebral arteries: The vertebral arteries are codominant. There is fairly prominent multifocal mixed plaque within the V1 and proximal V2 right vertebral artery with sites of moderate/severe stenosis. There is also calcified plaque within the distal cervical right vertebral artery at the C1-C2 level with up to mild/moderate stenosis at this site. The left vertebral artery is patent throughout the neck with high-grade stenosis at its origin. Skeleton: No acute bony abnormality or aggressive osseous lesion. Cervical spondylosis. Most notably at C3-C4, there is advanced disc space narrowing and a disc bulge with osteophyte ridge contributes to suspected at least moderate spinal canal stenosis. Other neck: No neck mass or cervical lymphadenopathy. Thyroid unremarkable. Upper chest: No consolidation within the imaged lung apices. Prior median sternotomy. Partially imaged left chest multi lead AICD device.  Partially imaged right IJ approach central venous catheter. Review of the MIP images confirms the above findings CTA HEAD FINDINGS Anterior circulation: The intracranial internal carotid arteries are patent. Calcified plaque within both vessels. Sites of up to moderate stenosis within the paraclinoid right ICA. Sites of moderate stenosis within the cavernous left ICA. The M1 middle cerebral arteries are patent without significant stenosis. No M2 proximal branch occlusion or high-grade proximal arterial stenosis is identified. The anterior cerebral arteries are patent without significant proximal stenosis. No intracranial aneurysm is identified. Posterior circulation: The intracranial vertebral arteries are patent. Calcified plaque results in severe stenosis within the right V4 segment. The basilar artery is patent without significant stenosis. The posterior cerebral arteries are patent bilaterally. Moderate/severe stenosis within a P3 left PCA branch (series 13, image 32). Posterior communicating arteries are hypoplastic or absent bilaterally. Venous sinuses: Within limitations of contrast timing, no convincing thrombus. Anatomic variants: As described. Review of the MIP images confirms the above findings IMPRESSION: CT head: Unremarkable non-contrast CT appearance of the brain. No evidence of acute intracranial abnormality. CTA neck: 1. The right common and internal carotid arteries are patent within the neck. There is somewhat prominent calcified plaque within the carotid bifurcation and proximal ICA. Apparent stenosis of the proximal right ICA of up to 50-60%, although this stenosis may be overestimated due to blooming artifact from irregular calcified plaque at this site. Carotid artery duplex may be obtained for further evaluation,  as clinically warranted. 2. The left common and internal carotids are patent within the neck, as is the left carotid stent. Artifact limits evaluation for intra-stent stenosis. 3.  The right vertebral artery is patent. here are sites of moderate/severe atherosclerotic narrowing within the V1 and proximal V2 segments. Additionally, there is mild/moderate atherosclerotic narrowing of this vessel at the C1-C2 level. 4. The left vertebral artery is patent. Severe atherosclerotic narrowing at the origin of this vessel. 5. Cervical spondylosis as described and greatest at C3-C4 where there is suspected at least moderate spinal canal stenosis. CTA head: 1. No intracranial large vessel occlusion. 2. Intracranial atherosclerotic disease with multifocal stenoses, most notably as follows. 3. Sites of up to moderate stenosis within the paraclinoid right ICA and cavernous left ICA. 4. High-grade stenosis within the V4 right vertebral artery. 5. Moderate/severe focal stenosis within a P3 left PCA branch vessel. Electronically Signed   By: Kellie Simmering DO   On: 10/31/2019 20:28        Scheduled Meds: . amiodarone  100 mg Oral Daily  . Chlorhexidine Gluconate Cloth  6 each Topical Q0600  . clopidogrel  75 mg Oral Q breakfast  . doxycycline  100 mg Oral Q12H  . heparin  5,000 Units Subcutaneous Q8H  . insulin aspart  0-6 Units Subcutaneous TID WC  . predniSONE  5 mg Oral BID WC  . predniSONE  5 mg Oral Once  . sodium chloride flush  3 mL Intravenous Q12H   Continuous Infusions: . sodium chloride Stopped (11/01/19 0234)     LOS: 0 days    Time spent: 28 minutes    Sharen Hones, MD [facility]   To contact the attending provider between 7A-7P or the covering provider during after hours 7P-7A, please log into the web site [email or web address] and access using universal [place] password for that web site. If you do not have the password, please call the hospital operator.  11/01/2019, 1:17 PM

## 2019-11-01 NOTE — Progress Notes (Addendum)
Pt requesting Left AC IV moved, causing pain when bends arm. IV team inserted new IV in L upper arm. Tried to start magnesium sulfate and NS; occluded line. Assessed IV site and catheter was pulled out and bent under tape. Placed another IV consult for replacement of IV. Will contact pharmacy to reschedule Magnesium Sulfate once IV has been replaced.   UPDATE: Patient's IV access re established. Restarted Mag at 0234 with IV team at bedside. Catheter appeared to be bent to me when assessing why it was occluded. IV team able to straighten catheter without replacing IV entirely.

## 2019-11-02 DIAGNOSIS — I96 Gangrene, not elsewhere classified: Secondary | ICD-10-CM

## 2019-11-02 DIAGNOSIS — R778 Other specified abnormalities of plasma proteins: Secondary | ICD-10-CM

## 2019-11-02 DIAGNOSIS — N186 End stage renal disease: Secondary | ICD-10-CM

## 2019-11-02 DIAGNOSIS — R61 Generalized hyperhidrosis: Secondary | ICD-10-CM

## 2019-11-02 DIAGNOSIS — I480 Paroxysmal atrial fibrillation: Secondary | ICD-10-CM

## 2019-11-02 DIAGNOSIS — L988 Other specified disorders of the skin and subcutaneous tissue: Secondary | ICD-10-CM

## 2019-11-02 DIAGNOSIS — Z992 Dependence on renal dialysis: Secondary | ICD-10-CM

## 2019-11-02 DIAGNOSIS — E119 Type 2 diabetes mellitus without complications: Secondary | ICD-10-CM

## 2019-11-02 DIAGNOSIS — I951 Orthostatic hypotension: Secondary | ICD-10-CM

## 2019-11-02 LAB — LIPID PANEL
Cholesterol: 188 mg/dL (ref 0–200)
HDL: 50 mg/dL (ref >40)
LDL Cholesterol: 121 mg/dL — ABNORMAL HIGH (ref 0–99)
Total CHOL/HDL Ratio: 3.8 RATIO
Triglycerides: 85 mg/dL (ref <150)
VLDL: 17 mg/dL (ref 0–40)

## 2019-11-02 LAB — CBC WITH DIFFERENTIAL/PLATELET
Abs Immature Granulocytes: 0.11 10*3/uL — ABNORMAL HIGH (ref 0.00–0.07)
Basophils Absolute: 0 10*3/uL (ref 0.0–0.1)
Basophils Relative: 0 %
Eosinophils Absolute: 0 10*3/uL (ref 0.0–0.5)
Eosinophils Relative: 0 %
HCT: 38 % — ABNORMAL LOW (ref 39.0–52.0)
Hemoglobin: 12.6 g/dL — ABNORMAL LOW (ref 13.0–17.0)
Immature Granulocytes: 1 %
Lymphocytes Relative: 9 %
Lymphs Abs: 1.3 10*3/uL (ref 0.7–4.0)
MCH: 31.5 pg (ref 26.0–34.0)
MCHC: 33.2 g/dL (ref 30.0–36.0)
MCV: 95 fL (ref 80.0–100.0)
Monocytes Absolute: 1.2 10*3/uL — ABNORMAL HIGH (ref 0.1–1.0)
Monocytes Relative: 8 %
Neutro Abs: 12.5 10*3/uL — ABNORMAL HIGH (ref 1.7–7.7)
Neutrophils Relative %: 82 %
Platelets: 270 10*3/uL (ref 150–400)
RBC: 4 MIL/uL — ABNORMAL LOW (ref 4.22–5.81)
RDW: 13.9 % (ref 11.5–15.5)
WBC: 15.2 10*3/uL — ABNORMAL HIGH (ref 4.0–10.5)
nRBC: 0 % (ref 0.0–0.2)

## 2019-11-02 LAB — HEMOGLOBIN A1C
Hgb A1c MFr Bld: 7.6 % — ABNORMAL HIGH (ref 4.8–5.6)
Mean Plasma Glucose: 171 mg/dL

## 2019-11-02 LAB — GLUCOSE, CAPILLARY
Glucose-Capillary: 88 mg/dL (ref 70–99)
Glucose-Capillary: 129 mg/dL — ABNORMAL HIGH (ref 70–99)
Glucose-Capillary: 104 mg/dL — ABNORMAL HIGH (ref 70–99)
Glucose-Capillary: 222 mg/dL — ABNORMAL HIGH (ref 70–99)

## 2019-11-02 LAB — BASIC METABOLIC PANEL
Anion gap: 13 (ref 5–15)
BUN: 43 mg/dL — ABNORMAL HIGH (ref 6–20)
CO2: 26 mmol/L (ref 22–32)
Calcium: 7.8 mg/dL — ABNORMAL LOW (ref 8.9–10.3)
Chloride: 100 mmol/L (ref 98–111)
Creatinine, Ser: 8.91 mg/dL — ABNORMAL HIGH (ref 0.61–1.24)
GFR calc Af Amer: 7 mL/min — ABNORMAL LOW (ref >60)
GFR calc non Af Amer: 6 mL/min — ABNORMAL LOW (ref >60)
Glucose, Bld: 117 mg/dL — ABNORMAL HIGH (ref 70–99)
Potassium: 4.1 mmol/L (ref 3.5–5.1)
Sodium: 139 mmol/L (ref 135–145)

## 2019-11-02 LAB — MAGNESIUM: Magnesium: 2.2 mg/dL (ref 1.7–2.4)

## 2019-11-02 MED ORDER — COLLAGENASE 250 UNIT/GM EX OINT
TOPICAL_OINTMENT | CUTANEOUS | Status: DC
  Filled 2019-11-02: qty 30

## 2019-11-02 MED ORDER — AMOXICILLIN-POT CLAVULANATE 500-125 MG PO TABS
500.0000 mg | ORAL_TABLET | Freq: Every day | ORAL | Status: DC
  Administered 2019-11-02 – 2019-11-04 (×3): 500 mg via ORAL
  Filled 2019-11-02 (×3): qty 1

## 2019-11-02 NOTE — Consult Note (Signed)
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 advanced disc space narrowing and a disc bulge with osteophyte ridge contributes to suspected at least moderate spinal canal stenosis. Other neck: No neck mass or cervical lymphadenopathy. Thyroid unremarkable. Upper chest: No consolidation within the imaged lung apices. Prior median sternotomy. Partially imaged left chest multi lead AICD device. Partially imaged right IJ approach central venous catheter. Review of the MIP images confirms the above findings CTA HEAD FINDINGS Anterior circulation: The intracranial internal carotid arteries are patent. Calcified plaque within both vessels. Sites of up to moderate stenosis within the paraclinoid right ICA. Sites of moderate stenosis within the cavernous left ICA. The M1 middle cerebral arteries are patent without significant stenosis. No M2 proximal branch occlusion or high-grade proximal arterial stenosis is identified. The anterior cerebral arteries are patent without significant proximal stenosis. No intracranial aneurysm is identified. Posterior circulation: The intracranial vertebral arteries are patent. Calcified plaque results in severe stenosis within the right V4 segment. The basilar artery is patent without  significant stenosis. The posterior cerebral arteries are patent bilaterally. Moderate/severe stenosis within a P3 left PCA branch (series 13, image 32). Posterior communicating arteries are hypoplastic or absent bilaterally. Venous sinuses: Within  limitations of contrast timing, no convincing thrombus. Anatomic variants: As described. Review of the MIP images confirms the above findings IMPRESSION: CT head: Unremarkable non-contrast CT appearance of the brain. No evidence of acute intracranial abnormality. CTA neck: 1. The right common and internal carotid arteries are patent within the neck. There is somewhat prominent calcified plaque within the carotid bifurcation and proximal ICA. Apparent stenosis of the proximal right ICA of up to 50-60%, although this stenosis may be overestimated due to blooming artifact from irregular calcified plaque at this site. Carotid artery duplex may be obtained for further evaluation, as clinically warranted. 2. The left common and internal carotids are patent within the neck, as is the left carotid stent. Artifact limits evaluation for intra-stent stenosis. 3. The right vertebral artery is patent. here are sites of moderate/severe atherosclerotic narrowing within the V1 and proximal V2 segments. Additionally, there is mild/moderate atherosclerotic narrowing of this vessel at the C1-C2 level. 4. The left vertebral artery is patent. Severe atherosclerotic narrowing at the origin of this vessel. 5. Cervical spondylosis as described and greatest at C3-C4 where there is suspected at least moderate spinal canal stenosis. CTA head: 1. No intracranial large vessel occlusion. 2. Intracranial atherosclerotic disease with multifocal stenoses, most notably as follows. 3. Sites of up to moderate stenosis within the paraclinoid right ICA and cavernous left ICA. 4. High-grade stenosis within the V4 right vertebral artery. 5. Moderate/severe focal stenosis within a P3 left PCA branch vessel.  Electronically Signed   By: Kellie Simmering DO   On: 10/31/2019 20:28   CT Cervical Spine Wo Contrast  Result Date: 10/23/2019 CLINICAL DATA:  54 year old male with head trauma. EXAM: CT HEAD WITHOUT CONTRAST CT MAXILLOFACIAL WITHOUT CONTRAST CT CERVICAL SPINE WITHOUT CONTRAST TECHNIQUE: Multidetector CT imaging of the head, cervical spine, and maxillofacial structures were performed using the standard protocol without intravenous contrast. Multiplanar CT image reconstructions of the cervical spine and maxillofacial structures were also generated. COMPARISON:  Cervical spine CT dated 05/12/2018. Head CT dated 01/06/2017. FINDINGS: CT HEAD FINDINGS Brain: The ventricles and sulci appropriate size for patient's age. The gray-white matter discrimination is preserved. There is no acute intracranial hemorrhage. No mass effect or midline shift. No extra-axial fluid collection. Vascular: No hyperdense vessel or unexpected calcification. Skull: Normal. Negative for fracture or focal lesion. Other: None. CT MAXILLOFACIAL FINDINGS Osseous: No acute fracture or subluxation. Orbits: The globes and retro-orbital fat are preserved. Sinuses: Clear. Soft tissues: Negative. CT CERVICAL SPINE FINDINGS Alignment: No acute subluxation. There is straightening of normal cervical lordosis. Skull base and vertebrae: No acute fracture. Soft tissues and spinal canal: Mild narrowing of the central canal at C3-C4 due to disc bulge and degenerative changes. Disc levels: Disc space narrowing and endplate irregularity primarily at C3-C4. Left C2-C3 and right C3-C4 facet arthropathy. Upper chest: Negative. Other: Right IJ dialysis catheter and partially visualized left pacemaker wires. Bilateral carotid bulb calcified plaques, left carotid stent, and atherosclerotic calcification of the right vertebral artery. IMPRESSION: 1. No acute intracranial pathology. 2. No acute/traumatic cervical spine pathology. 3. No acute facial bone fractures.  Electronically Signed   By: Anner Crete M.D.   On: 10/23/2019 18:39   ECHOCARDIOGRAM COMPLETE  Result Date: 11/01/2019    ECHOCARDIOGRAM REPORT   Patient Name:   Paul [name]. Date of Exam: 11/01/2019 Medical Rec #:  [id number]         Height:       69.0 in Accession #:    [id number]  Weight:       185.0 lb Date of Birth:  07/13/1965          BSA:          1.999 m Patient Age:    37 years          BP:           80/65 mmHg Patient Gender: M                 HR:           74 bpm. Exam Location:  [facility] Procedure: 2D Echo, Cardiac Doppler and Color Doppler Indications:     ventricular Tachycardia 147.2  History:         Patient has prior history of Echocardiogram examinations, most                  recent 05/25/2018. Previous Myocardial Infarction, Stroke; Risk                  Factors:Sleep Apnea and Hypertension. PVD.  Sonographer:     Sherrie Sport RDCS (AE) Referring Phys:  [id number] Cleaster Corin PATEL Diagnosing Phys: Kate Sable MD IMPRESSIONS  1. Left ventricular ejection fraction, by estimation, is 25 to 30%. The left ventricle has severely decreased function. The left ventricle demonstrates global hypokinesis. Left ventricular diastolic parameters are consistent with Grade I diastolic dysfunction (impaired relaxation).  2. Right ventricular systolic function is moderately reduced. The right ventricular size is normal. There is normal pulmonary artery systolic pressure.  3. The mitral valve is normal in structure. No evidence of mitral valve regurgitation.  4. The aortic valve is normal in structure. Aortic valve regurgitation is not visualized.  5. The inferior vena cava is normal in size with greater than 50% respiratory variability, suggesting right atrial pressure of 3 mmHg. FINDINGS  Left Ventricle: Left ventricular ejection fraction, by estimation, is 25 to 30%. The left ventricle has severely decreased function. The left ventricle demonstrates global hypokinesis. The left ventricular internal  cavity size was normal in size. There is no left ventricular hypertrophy. Left ventricular diastolic parameters are consistent with Grade I diastolic dysfunction (impaired relaxation). Right Ventricle: The right ventricular size is normal. No increase in right ventricular wall thickness. Right ventricular systolic function is moderately reduced. There is normal pulmonary artery systolic pressure. The tricuspid regurgitant velocity is 1.94 m/s, and with an assumed right atrial pressure of 3 mmHg, the estimated right ventricular systolic pressure is 36.6 mmHg. Left Atrium: Left atrial size was normal in size. Right Atrium: Right atrial size was normal in size. Pericardium: There is no evidence of pericardial effusion. Mitral Valve: The mitral valve is normal in structure. No evidence of mitral valve regurgitation. Tricuspid Valve: The tricuspid valve is grossly normal. Tricuspid valve regurgitation is not demonstrated. Aortic Valve: The aortic valve is normal in structure. Aortic valve regurgitation is not visualized. Aortic valve mean gradient measures 6.7 mmHg. Aortic valve peak gradient measures 12.7 mmHg. Aortic valve area, by VTI measures 1.72 cm. Pulmonic Valve: The pulmonic valve was not well visualized. Pulmonic valve regurgitation is not visualized. Aorta: The aortic root is normal in size and structure. Venous: The inferior vena cava is normal in size with greater than 50% respiratory variability, suggesting right atrial pressure of 3 mmHg. IAS/Shunts: No atrial level shunt detected by color flow Doppler. Additional Comments: A pacer wire is visualized.  LEFT VENTRICLE PLAX 2D LVIDd:         4.91 cm  Diastology LVIDs:         4.44 cm      LV e' lateral:   4.35 cm/s LV PW:         1.29 cm      LV E/e' lateral: 11.8 LV IVS:        1.08 cm      LV e' medial:    2.83 cm/s LVOT diam:     2.20 cm      LV E/e' medial:  18.2 LV SV:         46 LV SV Index:   23 LVOT Area:     3.80 cm  LV Volumes (MOD) LV vol d,  MOD A2C: 136.0 ml LV vol d, MOD A4C: 126.0 ml LV vol s, MOD A2C: 98.3 ml LV vol s, MOD A4C: 73.4 ml LV SV MOD A2C:     37.7 ml LV SV MOD A4C:     126.0 ml LV SV MOD BP:      47.8 ml RIGHT VENTRICLE RV Basal diam:  3.38 cm RV S prime:     7.51 cm/s TAPSE (M-mode): 3.5 cm LEFT ATRIUM           Index       RIGHT ATRIUM           Index LA diam:      3.60 cm 1.80 cm/m  RA Area:     14.70 cm LA Vol (A2C): 37.3 ml 18.66 ml/m RA Volume:   33.80 ml  16.91 ml/m LA Vol (A4C): 45.0 ml 22.52 ml/m  AORTIC VALVE                    PULMONIC VALVE AV Area (Vmax):    1.37 cm     PV Vmax:        0.79 m/s AV Area (Vmean):   1.29 cm     PV Peak grad:   2.5 mmHg AV Area (VTI):     1.72 cm     RVOT Peak grad: 2 mmHg AV Vmax:           178.00 cm/s AV Vmean:          113.667 cm/s AV VTI:            0.268 m AV Peak Grad:      12.7 mmHg AV Mean Grad:      6.7 mmHg LVOT Vmax:         64.30 cm/s LVOT Vmean:        38.500 cm/s LVOT VTI:          0.121 m LVOT/AV VTI ratio: 0.45  AORTA Ao Root diam: 2.70 cm MITRAL VALVE               TRICUSPID VALVE MV Area (PHT): 4.24 cm    TR Peak grad:   15.1 mmHg MV Decel Time: 179 msec    TR Vmax:        194.00 cm/s MV E velocity: 51.40 cm/s MV A velocity: 77.60 cm/s  SHUNTS MV E/A ratio:  0.66        Systemic VTI:  0.12 m                            Systemic Diam: 2.20 cm Kate Sable MD Electronically signed by Kate Sable MD Signature Date/Time: 11/01/2019/3:00:04 PM    Final    CT Maxillofacial Wo Contrast  Result Date: 10/23/2019 CLINICAL DATA:  54 year old male with head trauma. EXAM: CT HEAD WITHOUT CONTRAST CT MAXILLOFACIAL WITHOUT CONTRAST CT CERVICAL SPINE WITHOUT CONTRAST TECHNIQUE: Multidetector CT imaging of the head, cervical spine, and maxillofacial structures were performed using the standard protocol without intravenous contrast. Multiplanar CT image reconstructions of the cervical spine and maxillofacial structures were also generated. COMPARISON:  Cervical spine CT  dated 05/12/2018. Head CT dated 01/06/2017. FINDINGS: CT HEAD FINDINGS Brain: The ventricles and sulci appropriate size for patient's age. The gray-white matter discrimination is preserved. There is no acute intracranial hemorrhage. No mass effect or midline shift. No extra-axial fluid collection. Vascular: No hyperdense vessel or unexpected calcification. Skull: Normal. Negative for fracture or focal lesion. Other: None. CT MAXILLOFACIAL FINDINGS Osseous: No acute fracture or subluxation. Orbits: The globes and retro-orbital fat are preserved. Sinuses: Clear. Soft tissues: Negative. CT CERVICAL SPINE FINDINGS Alignment: No acute subluxation. There is straightening of normal cervical lordosis. Skull base and vertebrae: No acute fracture. Soft tissues and spinal canal: Mild narrowing of the central canal at C3-C4 due to disc bulge and degenerative changes. Disc levels: Disc space narrowing and endplate irregularity primarily at C3-C4. Left C2-C3 and right C3-C4 facet arthropathy. Upper chest: Negative. Other: Right IJ dialysis catheter and partially visualized left pacemaker wires. Bilateral carotid bulb calcified plaques, left carotid stent, and atherosclerotic calcification of the right vertebral artery. IMPRESSION: 1. No acute intracranial pathology. 2. No acute/traumatic cervical spine pathology. 3. No acute facial bone fractures. Electronically Signed   By: Anner Crete M.D.   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  We will plan to reprogram his device so as to offer ATP and hopefully thereby avoid the need for augmented antiarrhythmic therapy.  I am also concerned about hypotension and sweats in the context of his implanted device and his transcutaneous dialysis catheter.  Would be concerned about endocarditis/CIED and would recommend blood cultures.  He may will benefit from TEE and would ask [place] to think about having an ID consult to address the possibility of culture-negative endocarditis/CIED.  Doses chronic steroid therapy increase this risk . Did his chronic osteo seed something  He has significant orthostasis.  ProAmatine on his nondialysis days may be beneficial.  Might also benefit from compressive wear like an abdominal binder and/or thigh sleeves.  His nausea with sitting I suspect is also related to orthostasis.  Commendations to Dr. Deidre Ala for measuring the orthostatics this morning  I    Virl Axe

## 2019-11-02 NOTE — Discharge Instructions (Signed)
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
## 2019-11-02 NOTE — Progress Notes (Signed)
PROGRESS NOTE    Paul Ala.  QMG:[id number] DOB: [birth year] DOA: 10/31/2019 PCP: Tracie Harrier, MD   Chief complaint. Dizziness.  Brief Narrative: (Start on day 1 of progress note - keep it brief and live)  Paul Mackis a 54 y.o.malewith medical history significant forCAD s/p CABG in 2010, ischemic cardiomyopathy, HFrEF (EF 25-30%), s/p AICD, ESRD on MWF HD, type 2 diabetes, paroxysmal atrial fibrillation (not currently on anticoagulation), history of CVA, carotid artery disease s/p LICA stenting, PVD s/p right BKA, hypotension, anemia of chronic disease, and DJD with chronic low back pain who presents to the ED for evaluation after a syncopal episode.  In the emergency room, patient had a ventricular tachycardia of 150, lasted about 5 minutes.  Did not trigger her AICD firing.  Patient had a CT angiogram of the head, showed  multifocal stenoses noted with moderate stenosis within paraclinoid right ICA cavernous left ICA, high-grade stenosis within V4 right vertebral artery, moderate/severe focal stenosis within P3 left PCA branch vessel. ICD interrogation showed abnormal polymorphic ventricular tachycardia.  Cardiology consult was obtained.  Patient was given amiodarone.  7/1. Patient had a hypotension after hemodialysis yesterday, was given IV fluid bolus, also giving increased dose of prednisone. He also received a dose of midodrine. Cardiology recommended to rule out acute bacteremia with ICD lead infection. Will obtain blood cultures, ID consulted.     Assessment & Plan:   Principal Problem:   Syncope Active Problems:   Diabetes (HCC)   CAD (coronary artery disease)   Chronic systolic CHF (congestive heart failure) (HCC)   ESRD on dialysis ([place])   AICD (automatic cardioverter/defibrillator) present   PAF (paroxysmal atrial fibrillation) (HCC)   Elevated troponin   Ventricular tachycardia (HCC)   V tach (HCC)   Idiopathic hypotension  #1. Syncope  secondary to ventricular tachycardia with chronic systolic congestive heart failure. Patient ejection fraction was 25 to 30%. Patient has been seen by cardiology, will be evaluated by electrophysiology today. Cardiology has recommended infect disease involved, rule out the possibility of occult bacteremia with  lead infection. Blood cultures will be sent out.  2. Hypotension. Patient had a second hypotension yesterday after hemodialysis. He received IV fluid bolus, he also had increased dose of prednisone at that he was taking lower dose prednisone chronically. He also received a dose of midodrine. Blood pressure has been stable since. Obtain blood cultures per cardiology recommendation.  3. Elevated troponin. Likely secondary to ventricular tachycardia. Appreciate consult from cardiology.  4. End-stage renal disease on hemodialysis. Followed by nephrology.  5. Paroxysmal defibrillation.  Followed by cardiology.  6. Type 2 diabetes. Continue current regimen.  #7. Anemia of chronic disease. Stable.  DVT prophylaxis: Heparin SQ Code Status:Full Family Communication: None Disposition Plan:   Patient came from: Home                                                                                                               Anticipated d/c place:Home  Barriers to d/c OR conditions which  need to be met to effect a safe d/c:   Consultants:   Cardiology  Nephrology  ID  Procedures:HD Antimicrobials: None       Subjective: Patient had a significant hypotension yesterday, received IV fluid bolus. He still has some dizziness today, blood pressure is better. No fever chills. No nausea vomiting or diarrhea.  Objective: Vitals:   11/02/19 0600 11/02/19 0727 11/02/19 0919 11/02/19 0922  BP: (!) 112/51 (!) 163/55 (!) 165/100 118/77  Pulse: 79 85 85 87  Resp:  18    Temp: 98.6 F (37 C) 98.6 F (37 C)    TempSrc: Oral Oral    SpO2: 99% 100% 100% 100%  Weight:       Height:        Intake/Output Summary (Last 24 hours) at 11/02/2019 0923 Last data filed at 11/02/2019 0307 Gross per 24 hour  Intake 0 ml  Output 1500 ml  Net -1500 ml   Filed Weights   10/31/19 1700  Weight: 83.9 kg    Examination:  General exam: Appears calm and comfortable  Respiratory system: Clear to auscultation. Respiratory effort normal. Cardiovascular system: S1 & S2 heard, RRR. No JVD, murmurs, rubs, gallops or clicks. No pedal edema. Gastrointestinal system: Abdomen is nondistended, soft and nontender. No organomegaly or masses felt. Normal bowel sounds heard. Central nervous system: Alert and oriented. No focal neurological deficits. Extremities: R BKA. Skin: No rashes, lesions or ulcers Psychiatry: Judgement and insight appear normal. Mood & affect appropriate.     Data Reviewed: I have personally reviewed following labs and imaging studies  CBC: Recent Labs  Lab 10/31/19 1745 11/01/19 0442 11/02/19 0547  WBC 13.7* 12.8* 15.2*  NEUTROABS 11.2*  --  12.5*  HGB 12.5* 12.2* 12.6*  HCT 38.6* 36.2* 38.0*  MCV 97.0 95.0 95.0  PLT 285 266 470   Basic Metabolic Panel: Recent Labs  Lab 10/31/19 1745 11/01/19 0442 11/02/19 0547  NA 136 134* 139  K 3.5 3.6 4.1  CL 99 99 100  CO2 21* 23 26  GLUCOSE 230* 159* 117*  BUN 48* 54* 43*  CREATININE 10.56* 11.28* 8.91*  CALCIUM 7.6* 7.4* 7.8*  MG 2.2  --  2.2   GFR: Estimated Creatinine Clearance: 9.5 mL/min (A) (by C-G formula based on SCr of 8.91 mg/dL (H)). Liver Function Tests: Recent Labs  Lab 10/31/19 1745  AST 15  ALT 16  ALKPHOS 89  BILITOT 0.6  PROT 6.9  ALBUMIN 3.5   No results for input(s): LIPASE, AMYLASE in the last 168 hours. No results for input(s): AMMONIA in the last 168 hours. Coagulation Profile: Recent Labs  Lab 10/31/19 1745  INR 1.0   Cardiac Enzymes: No results for input(s): CKTOTAL, CKMB, CKMBINDEX, TROPONINI in the last 168 hours. BNP (last 3 results) No results for  input(s): PROBNP in the last 8760 hours. HbA1C: Recent Labs    11/01/19 0442  HGBA1C 7.6*   CBG: Recent Labs  Lab 11/01/19 0759 11/01/19 1233 11/01/19 1633 11/01/19 2119 11/02/19 0730  GLUCAP 106* 81 171* 138* 88   Lipid Profile: Recent Labs    11/02/19 0547  CHOL 188  HDL 50  LDLCALC 121*  TRIG 85  CHOLHDL 3.8   Thyroid Function Tests: No results for input(s): TSH, T4TOTAL, FREET4, T3FREE, THYROIDAB in the last 72 hours. Anemia Panel: No results for input(s): VITAMINB12, FOLATE, FERRITIN, TIBC, IRON, RETICCTPCT in the last 72 hours. Sepsis Labs: No results for input(s): PROCALCITON, LATICACIDVEN in the last 168 hours.  Recent Results (from the past 240 hour(s))  SARS Coronavirus 2 by RT PCR (hospital order, performed in [facility] hospital lab) Nasopharyngeal Nasopharyngeal Swab     Status: None   Collection Time: 10/31/19  7:28 PM   Specimen: Nasopharyngeal Swab  Result Value Ref Range Status   SARS Coronavirus 2 NEGATIVE NEGATIVE Final    Comment: (NOTE) SARS-CoV-2 target nucleic acids are NOT DETECTED.  The SARS-CoV-2 RNA is generally detectable in upper and lower respiratory specimens during the acute phase of infection. The lowest concentration of SARS-CoV-2 viral copies this assay can detect is 250 copies / mL. A negative result does not preclude SARS-CoV-2 infection and should not be used as the sole basis for treatment or other patient management decisions.  A negative result may occur with improper specimen collection / handling, submission of specimen other than nasopharyngeal swab, presence of viral mutation(s) within the areas targeted by this assay, and inadequate number of viral copies (<250 copies / mL). A negative result must be combined with clinical observations, patient history, and epidemiological information.  Fact Sheet for Patients:   StrictlyIdeas.no  Fact Sheet for Healthcare  Providers: BankingDealers.co.za  This test is not yet approved or  cleared by the [place] FDA and has been authorized for detection and/or diagnosis of SARS-CoV-2 by FDA under an Emergency Use Authorization (EUA).  This EUA will remain in effect (meaning this test can be used) for the duration of the COVID-19 declaration under Section 564(b)(1) of the Act, 21 U.S.C. section 360bbb-3(b)(1), unless the authorization is terminated or revoked sooner.  Performed at [facility], [address], [place], [place] [address]          Radiology Studies: CT Angio Head W or Wo Contrast  Result Date: 10/31/2019 CLINICAL DATA:  Headache, sudden, carotid/vertebral dissection suspected. Additional history provided: Syncopal episode patient reports he awoke in his wheelchair with headache and dizziness as well as sweating. EXAM: CT ANGIOGRAPHY HEAD AND NECK TECHNIQUE: Multidetector CT imaging of the head and neck was performed using the standard protocol during bolus administration of intravenous contrast. Multiplanar CT image reconstructions and MIPs were obtained to evaluate the vascular anatomy. Carotid stenosis measurements (when applicable) are obtained utilizing NASCET criteria, using the distal internal carotid diameter as the denominator. CONTRAST:  14m OMNIPAQUE IOHEXOL 350 MG/ML SOLN COMPARISON:  Head CT 10/23/2019, report from CT angiogram neck 07/24/2008 (images unavailable), report from carotid artery duplex 09/07/2018. FINDINGS: CT HEAD FINDINGS Brain: Cerebral volume is normal for age. There is no acute intracranial hemorrhage. No demarcated cortical infarct is identified. No extra-axial fluid collection. No evidence of intracranial mass. No midline shift. Vascular: Reported below. Skull: Normal. Negative for fracture or focal lesion. Sinuses: No significant paranasal sinus disease or mastoid effusion at the imaged levels. Orbits: No acute abnormality.  Review of the MIP images confirms the above findings CTA NECK FINDINGS Aortic arch: Common origin of the innominate and left common carotid arteries. Atherosclerotic plaque within the visualized aortic arch and proximal major branch vessels of the neck. No hemodynamically significant innominate or proximal subclavian artery stenosis. Right carotid system: CCA and ICA patent within the neck. Mild scattered mixed plaque within the CCA. There is more prominent calcified plaque within the carotid bifurcation and proximal ICA. There is stenosis of the proximal ICA estimated at up to 50-60%, although this may be overestimated on the current exam due to blooming artifact from irregular calcified plaque. Left carotid system: The CCA and ICA are patent. There  is a stent extending from the distal CCA into the proximal ICA. There is no hemodynamically significant stenosis within the CCA or cervical ICA proximal or distal to the stent. The stent is patent, although artifact limits evaluation for intra-stent stenosis. Vertebral arteries: The vertebral arteries are codominant. There is fairly prominent multifocal mixed plaque within the V1 and proximal V2 right vertebral artery with sites of moderate/severe stenosis. There is also calcified plaque within the distal cervical right vertebral artery at the C1-C2 level with up to mild/moderate stenosis at this site. The left vertebral artery is patent throughout the neck with high-grade stenosis at its origin. Skeleton: No acute bony abnormality or aggressive osseous lesion. Cervical spondylosis. Most notably at C3-C4, there is advanced disc space narrowing and a disc bulge with osteophyte ridge contributes to suspected at least moderate spinal canal stenosis. Other neck: No neck mass or cervical lymphadenopathy. Thyroid unremarkable. Upper chest: No consolidation within the imaged lung apices. Prior median sternotomy. Partially imaged left chest multi lead AICD device. Partially  imaged right IJ approach central venous catheter. Review of the MIP images confirms the above findings CTA HEAD FINDINGS Anterior circulation: The intracranial internal carotid arteries are patent. Calcified plaque within both vessels. Sites of up to moderate stenosis within the paraclinoid right ICA. Sites of moderate stenosis within the cavernous left ICA. The M1 middle cerebral arteries are patent without significant stenosis. No M2 proximal branch occlusion or high-grade proximal arterial stenosis is identified. The anterior cerebral arteries are patent without significant proximal stenosis. No intracranial aneurysm is identified. Posterior circulation: The intracranial vertebral arteries are patent. Calcified plaque results in severe stenosis within the right V4 segment. The basilar artery is patent without significant stenosis. The posterior cerebral arteries are patent bilaterally. Moderate/severe stenosis within a P3 left PCA branch (series 13, image 32). Posterior communicating arteries are hypoplastic or absent bilaterally. Venous sinuses: Within limitations of contrast timing, no convincing thrombus. Anatomic variants: As described. Review of the MIP images confirms the above findings IMPRESSION: CT head: Unremarkable non-contrast CT appearance of the brain. No evidence of acute intracranial abnormality. CTA neck: 1. The right common and internal carotid arteries are patent within the neck. There is somewhat prominent calcified plaque within the carotid bifurcation and proximal ICA. Apparent stenosis of the proximal right ICA of up to 50-60%, although this stenosis may be overestimated due to blooming artifact from irregular calcified plaque at this site. Carotid artery duplex may be obtained for further evaluation, as clinically warranted. 2. The left common and internal carotids are patent within the neck, as is the left carotid stent. Artifact limits evaluation for intra-stent stenosis. 3. The right  vertebral artery is patent. here are sites of moderate/severe atherosclerotic narrowing within the V1 and proximal V2 segments. Additionally, there is mild/moderate atherosclerotic narrowing of this vessel at the C1-C2 level. 4. The left vertebral artery is patent. Severe atherosclerotic narrowing at the origin of this vessel. 5. Cervical spondylosis as described and greatest at C3-C4 where there is suspected at least moderate spinal canal stenosis. CTA head: 1. No intracranial large vessel occlusion. 2. Intracranial atherosclerotic disease with multifocal stenoses, most notably as follows. 3. Sites of up to moderate stenosis within the paraclinoid right ICA and cavernous left ICA. 4. High-grade stenosis within the V4 right vertebral artery. 5. Moderate/severe focal stenosis within a P3 left PCA branch vessel. Electronically Signed   By: Kellie Simmering DO   On: 10/31/2019 20:28   DG Chest 2 View  Result Date:  10/31/2019 CLINICAL DATA:  Syncope. EXAM: CHEST - 2 VIEW COMPARISON:  04/10/2019 FINDINGS: There is a well-positioned right-sided tunneled dialysis catheter. The heart size is stable but enlarged. The patient is status post prior median sternotomy. There is a dual chamber left-sided pacemaker/ICD with stable positioning of the leads. There is no pneumothorax. No significant pleural effusion. No focal infiltrate. There is no acute osseous abnormality. IMPRESSION: No active cardiopulmonary disease. Electronically Signed   By: Constance Holster M.D.   On: 10/31/2019 21:33   CT Angio Neck W and/or Wo Contrast  Result Date: 10/31/2019 CLINICAL DATA:  Headache, sudden, carotid/vertebral dissection suspected. Additional history provided: Syncopal episode patient reports he awoke in his wheelchair with headache and dizziness as well as sweating. EXAM: CT ANGIOGRAPHY HEAD AND NECK TECHNIQUE: Multidetector CT imaging of the head and neck was performed using the standard protocol during bolus administration of  intravenous contrast. Multiplanar CT image reconstructions and MIPs were obtained to evaluate the vascular anatomy. Carotid stenosis measurements (when applicable) are obtained utilizing NASCET criteria, using the distal internal carotid diameter as the denominator. CONTRAST:  90m OMNIPAQUE IOHEXOL 350 MG/ML SOLN COMPARISON:  Head CT 10/23/2019, report from CT angiogram neck 07/24/2008 (images unavailable), report from carotid artery duplex 09/07/2018. FINDINGS: CT HEAD FINDINGS Brain: Cerebral volume is normal for age. There is no acute intracranial hemorrhage. No demarcated cortical infarct is identified. No extra-axial fluid collection. No evidence of intracranial mass. No midline shift. Vascular: Reported below. Skull: Normal. Negative for fracture or focal lesion. Sinuses: No significant paranasal sinus disease or mastoid effusion at the imaged levels. Orbits: No acute abnormality. Review of the MIP images confirms the above findings CTA NECK FINDINGS Aortic arch: Common origin of the innominate and left common carotid arteries. Atherosclerotic plaque within the visualized aortic arch and proximal major branch vessels of the neck. No hemodynamically significant innominate or proximal subclavian artery stenosis. Right carotid system: CCA and ICA patent within the neck. Mild scattered mixed plaque within the CCA. There is more prominent calcified plaque within the carotid bifurcation and proximal ICA. There is stenosis of the proximal ICA estimated at up to 50-60%, although this may be overestimated on the current exam due to blooming artifact from irregular calcified plaque. Left carotid system: The CCA and ICA are patent. There is a stent extending from the distal CCA into the proximal ICA. There is no hemodynamically significant stenosis within the CCA or cervical ICA proximal or distal to the stent. The stent is patent, although artifact limits evaluation for intra-stent stenosis. Vertebral arteries: The  vertebral arteries are codominant. There is fairly prominent multifocal mixed plaque within the V1 and proximal V2 right vertebral artery with sites of moderate/severe stenosis. There is also calcified plaque within the distal cervical right vertebral artery at the C1-C2 level with up to mild/moderate stenosis at this site. The left vertebral artery is patent throughout the neck with high-grade stenosis at its origin. Skeleton: No acute bony abnormality or aggressive osseous lesion. Cervical spondylosis. Most notably at C3-C4, there is advanced disc space narrowing and a disc bulge with osteophyte ridge contributes to suspected at least moderate spinal canal stenosis. Other neck: No neck mass or cervical lymphadenopathy. Thyroid unremarkable. Upper chest: No consolidation within the imaged lung apices. Prior median sternotomy. Partially imaged left chest multi lead AICD device. Partially imaged right IJ approach central venous catheter. Review of the MIP images confirms the above findings CTA HEAD FINDINGS Anterior circulation: The intracranial internal carotid arteries are patent. Calcified  plaque within both vessels. Sites of up to moderate stenosis within the paraclinoid right ICA. Sites of moderate stenosis within the cavernous left ICA. The M1 middle cerebral arteries are patent without significant stenosis. No M2 proximal branch occlusion or high-grade proximal arterial stenosis is identified. The anterior cerebral arteries are patent without significant proximal stenosis. No intracranial aneurysm is identified. Posterior circulation: The intracranial vertebral arteries are patent. Calcified plaque results in severe stenosis within the right V4 segment. The basilar artery is patent without significant stenosis. The posterior cerebral arteries are patent bilaterally. Moderate/severe stenosis within a P3 left PCA branch (series 13, image 32). Posterior communicating arteries are hypoplastic or absent  bilaterally. Venous sinuses: Within limitations of contrast timing, no convincing thrombus. Anatomic variants: As described. Review of the MIP images confirms the above findings IMPRESSION: CT head: Unremarkable non-contrast CT appearance of the brain. No evidence of acute intracranial abnormality. CTA neck: 1. The right common and internal carotid arteries are patent within the neck. There is somewhat prominent calcified plaque within the carotid bifurcation and proximal ICA. Apparent stenosis of the proximal right ICA of up to 50-60%, although this stenosis may be overestimated due to blooming artifact from irregular calcified plaque at this site. Carotid artery duplex may be obtained for further evaluation, as clinically warranted. 2. The left common and internal carotids are patent within the neck, as is the left carotid stent. Artifact limits evaluation for intra-stent stenosis. 3. The right vertebral artery is patent. here are sites of moderate/severe atherosclerotic narrowing within the V1 and proximal V2 segments. Additionally, there is mild/moderate atherosclerotic narrowing of this vessel at the C1-C2 level. 4. The left vertebral artery is patent. Severe atherosclerotic narrowing at the origin of this vessel. 5. Cervical spondylosis as described and greatest at C3-C4 where there is suspected at least moderate spinal canal stenosis. CTA head: 1. No intracranial large vessel occlusion. 2. Intracranial atherosclerotic disease with multifocal stenoses, most notably as follows. 3. Sites of up to moderate stenosis within the paraclinoid right ICA and cavernous left ICA. 4. High-grade stenosis within the V4 right vertebral artery. 5. Moderate/severe focal stenosis within a P3 left PCA branch vessel. Electronically Signed   By: Kellie Simmering DO   On: 10/31/2019 20:28   ECHOCARDIOGRAM COMPLETE  Result Date: 11/01/2019    ECHOCARDIOGRAM REPORT   Patient Name:   Paul [name]. Date of Exam: 11/01/2019 Medical  Rec #:  [id number]         Height:       69.0 in Accession #:    [id number]        Weight:       185.0 lb Date of Birth:  Apr 02, 1966          BSA:          1.999 m Patient Age:    21 years          BP:           80/65 mmHg Patient Gender: M                 HR:           74 bpm. Exam Location:  [facility] Procedure: 2D Echo, Cardiac Doppler and Color Doppler Indications:     ventricular Tachycardia 147.2  History:         Patient has prior history of Echocardiogram examinations, most                  recent  05/25/2018. Previous Myocardial Infarction, Stroke; Risk                  Factors:Sleep Apnea and Hypertension. PVD.  Sonographer:     Sherrie Sport RDCS (AE) Referring Phys:  [id number] Cleaster Corin PATEL Diagnosing Phys: Kate Sable MD IMPRESSIONS  1. Left ventricular ejection fraction, by estimation, is 25 to 30%. The left ventricle has severely decreased function. The left ventricle demonstrates global hypokinesis. Left ventricular diastolic parameters are consistent with Grade I diastolic dysfunction (impaired relaxation).  2. Right ventricular systolic function is moderately reduced. The right ventricular size is normal. There is normal pulmonary artery systolic pressure.  3. The mitral valve is normal in structure. No evidence of mitral valve regurgitation.  4. The aortic valve is normal in structure. Aortic valve regurgitation is not visualized.  5. The inferior vena cava is normal in size with greater than 50% respiratory variability, suggesting right atrial pressure of 3 mmHg. FINDINGS  Left Ventricle: Left ventricular ejection fraction, by estimation, is 25 to 30%. The left ventricle has severely decreased function. The left ventricle demonstrates global hypokinesis. The left ventricular internal cavity size was normal in size. There is no left ventricular hypertrophy. Left ventricular diastolic parameters are consistent with Grade I diastolic dysfunction (impaired relaxation). Right Ventricle: The right ventricular  size is normal. No increase in right ventricular wall thickness. Right ventricular systolic function is moderately reduced. There is normal pulmonary artery systolic pressure. The tricuspid regurgitant velocity is 1.94 m/s, and with an assumed right atrial pressure of 3 mmHg, the estimated right ventricular systolic pressure is 86.5 mmHg. Left Atrium: Left atrial size was normal in size. Right Atrium: Right atrial size was normal in size. Pericardium: There is no evidence of pericardial effusion. Mitral Valve: The mitral valve is normal in structure. No evidence of mitral valve regurgitation. Tricuspid Valve: The tricuspid valve is grossly normal. Tricuspid valve regurgitation is not demonstrated. Aortic Valve: The aortic valve is normal in structure. Aortic valve regurgitation is not visualized. Aortic valve mean gradient measures 6.7 mmHg. Aortic valve peak gradient measures 12.7 mmHg. Aortic valve area, by VTI measures 1.72 cm. Pulmonic Valve: The pulmonic valve was not well visualized. Pulmonic valve regurgitation is not visualized. Aorta: The aortic root is normal in size and structure. Venous: The inferior vena cava is normal in size with greater than 50% respiratory variability, suggesting right atrial pressure of 3 mmHg. IAS/Shunts: No atrial level shunt detected by color flow Doppler. Additional Comments: A pacer wire is visualized.  LEFT VENTRICLE PLAX 2D LVIDd:         4.91 cm      Diastology LVIDs:         4.44 cm      LV e' lateral:   4.35 cm/s LV PW:         1.29 cm      LV E/e' lateral: 11.8 LV IVS:        1.08 cm      LV e' medial:    2.83 cm/s LVOT diam:     2.20 cm      LV E/e' medial:  18.2 LV SV:         46 LV SV Index:   23 LVOT Area:     3.80 cm  LV Volumes (MOD) LV vol d, MOD A2C: 136.0 ml LV vol d, MOD A4C: 126.0 ml LV vol s, MOD A2C: 98.3 ml LV vol s, MOD A4C: 73.4 ml LV SV MOD  A2C:     37.7 ml LV SV MOD A4C:     126.0 ml LV SV MOD BP:      47.8 ml RIGHT VENTRICLE RV Basal diam:  3.38 cm  RV S prime:     7.51 cm/s TAPSE (M-mode): 3.5 cm LEFT ATRIUM           Index       RIGHT ATRIUM           Index LA diam:      3.60 cm 1.80 cm/m  RA Area:     14.70 cm LA Vol (A2C): 37.3 ml 18.66 ml/m RA Volume:   33.80 ml  16.91 ml/m LA Vol (A4C): 45.0 ml 22.52 ml/m  AORTIC VALVE                    PULMONIC VALVE AV Area (Vmax):    1.37 cm     PV Vmax:        0.79 m/s AV Area (Vmean):   1.29 cm     PV Peak grad:   2.5 mmHg AV Area (VTI):     1.72 cm     RVOT Peak grad: 2 mmHg AV Vmax:           178.00 cm/s AV Vmean:          113.667 cm/s AV VTI:            0.268 m AV Peak Grad:      12.7 mmHg AV Mean Grad:      6.7 mmHg LVOT Vmax:         64.30 cm/s LVOT Vmean:        38.500 cm/s LVOT VTI:          0.121 m LVOT/AV VTI ratio: 0.45  AORTA Ao Root diam: 2.70 cm MITRAL VALVE               TRICUSPID VALVE MV Area (PHT): 4.24 cm    TR Peak grad:   15.1 mmHg MV Decel Time: 179 msec    TR Vmax:        194.00 cm/s MV E velocity: 51.40 cm/s MV A velocity: 77.60 cm/s  SHUNTS MV E/A ratio:  0.66        Systemic VTI:  0.12 m                            Systemic Diam: 2.20 cm Kate Sable MD Electronically signed by Kate Sable MD Signature Date/Time: 11/01/2019/3:00:04 PM    Final         Scheduled Meds: . amiodarone  200 mg Oral Daily  . atorvastatin  40 mg Oral Daily  . calcium acetate  1,334 mg Oral TID WC  . Chlorhexidine Gluconate Cloth  6 each Topical Q0600  . clopidogrel  75 mg Oral Q breakfast  . doxycycline  100 mg Oral Q12H  . heparin  5,000 Units Subcutaneous Q8H  . insulin aspart  0-6 Units Subcutaneous TID WC  . midodrine  10 mg Oral Q M,W,F-HD  . predniSONE  5 mg Oral Q breakfast  . sodium chloride flush  3 mL Intravenous Q12H   Continuous Infusions: . sodium chloride Stopped (11/01/19 0234)     LOS: 1 day    Time spent: 28 minutes    Sharen Hones, MD [facility]   To contact the attending provider between 7A-7P or the covering provider during  after hours  7P-7A, please log into the web site [email or web address] and access using universal [place] password for that web site. If you do not have the password, please call the hospital operator.  11/02/2019, 9:23 AM

## 2019-11-02 NOTE — Progress Notes (Signed)
[facility]  ROUNDING NOTE   Subjective:   Hemodialysis treatment yesterday. Tolerated treatment well. UF of 1.5 liters.   Blood cultures ordered by cardiology.   Objective:  Vital signs in last 24 hours:  Temp:  [98 F (36.7 C)-98.7 F (37.1 C)] 98.7 F (37.1 C) (07/01 1519) Pulse Rate:  [78-90] 78 (07/01 1519) Resp:  [17-18] 18 (07/01 1519) BP: (70-165)/(24-101) 147/70 (07/01 1519) SpO2:  [96 %-100 %] 99 % (07/01 1519)  Weight change:  Filed Weights   10/31/19 1700  Weight: 83.9 kg    Intake/Output: I/O last 3 completed shifts: In: 512.2 [P.O.:360; I.V.:111; IV Piggyback:41.2] Out: 1500 [Other:1500]   Intake/Output this shift:  Total I/O In: 240 [P.O.:240] Out: -   Physical Exam: General: NAD,   Head: Normocephalic, atraumatic. Moist oral mucosal membranes  Eyes: Anicteric, PERRL  Neck: Supple, trachea midline  Lungs:  Clear to auscultation  Heart: Regular rate and rhythm  Abdomen:  Soft, nontender,   Extremities:  Right BKA, no edema  Neurologic: Nonfocal, moving all four extremities  Skin: No lesions  Access: RIJ permcath    Basic Metabolic Panel: Recent Labs  Lab 10/31/19 1745 11/01/19 0442 11/02/19 0547  NA 136 134* 139  K 3.5 3.6 4.1  CL 99 99 100  CO2 21* 23 26  GLUCOSE 230* 159* 117*  BUN 48* 54* 43*  CREATININE 10.56* 11.28* 8.91*  CALCIUM 7.6* 7.4* 7.8*  MG 2.2  --  2.2    Liver Function Tests: Recent Labs  Lab 10/31/19 1745  AST 15  ALT 16  ALKPHOS 89  BILITOT 0.6  PROT 6.9  ALBUMIN 3.5   No results for input(s): LIPASE, AMYLASE in the last 168 hours. No results for input(s): AMMONIA in the last 168 hours.  CBC: Recent Labs  Lab 10/31/19 1745 11/01/19 0442 11/02/19 0547  WBC 13.7* 12.8* 15.2*  NEUTROABS 11.2*  --  12.5*  HGB 12.5* 12.2* 12.6*  HCT 38.6* 36.2* 38.0*  MCV 97.0 95.0 95.0  PLT 285 266 270    Cardiac Enzymes: No results for input(s): CKTOTAL, CKMB, CKMBINDEX, TROPONINI in the last 168  hours.  BNP: Invalid input(s): POCBNP  CBG: Recent Labs  Lab 11/01/19 1233 11/01/19 1633 11/01/19 2119 11/02/19 0730 11/02/19 1131  GLUCAP 81 171* 138* 88 129*    Microbiology: Results for orders placed or performed during the hospital encounter of 10/31/19  SARS Coronavirus 2 by RT PCR (hospital order, performed in [facility] hospital lab) Nasopharyngeal Nasopharyngeal Swab     Status: None   Collection Time: 10/31/19  7:28 PM   Specimen: Nasopharyngeal Swab  Result Value Ref Range Status   SARS Coronavirus 2 NEGATIVE NEGATIVE Final    Comment: (NOTE) SARS-CoV-2 target nucleic acids are NOT DETECTED.  The SARS-CoV-2 RNA is generally detectable in upper and lower respiratory specimens during the acute phase of infection. The lowest concentration of SARS-CoV-2 viral copies this assay can detect is 250 copies / mL. A negative result does not preclude SARS-CoV-2 infection and should not be used as the sole basis for treatment or other patient management decisions.  A negative result may occur with improper specimen collection / handling, submission of specimen other than nasopharyngeal swab, presence of viral mutation(s) within the areas targeted by this assay, and inadequate number of viral copies (<250 copies / mL). A negative result must be combined with clinical observations, patient history, and epidemiological information.  Fact Sheet for Patients:   StrictlyIdeas.no  Fact Sheet  for Healthcare Providers: BankingDealers.co.za  This test is not yet approved or  cleared by the [place] and has been authorized for detection and/or diagnosis of SARS-CoV-2 by FDA under an Emergency Use Authorization (EUA).  This EUA will remain in effect (meaning this test can be used) for the duration of the COVID-19 declaration under Section 564(b)(1) of the Act, 21 U.S.C. section 360bbb-3(b)(1), unless the authorization is  terminated or revoked sooner.  Performed at [facility], [address] [place], [place] [address]     Coagulation Studies: Recent Labs    10/31/19 1745  LABPROT 12.5  INR 1.0    Urinalysis: No results for input(s): COLORURINE, LABSPEC, PHURINE, GLUCOSEU, HGBUR, BILIRUBINUR, KETONESUR, PROTEINUR, UROBILINOGEN, NITRITE, LEUKOCYTESUR in the last 72 hours.  Invalid input(s): APPERANCEUR    Imaging: CT Angio Head W or Wo Contrast  Result Date: 10/31/2019 CLINICAL DATA:  Headache, sudden, carotid/vertebral dissection suspected. Additional history provided: Syncopal episode patient reports he awoke in his wheelchair with headache and dizziness as well as sweating. EXAM: CT ANGIOGRAPHY HEAD AND NECK TECHNIQUE: Multidetector CT imaging of the head and neck was performed using the standard protocol during bolus administration of intravenous contrast. Multiplanar CT image reconstructions and MIPs were obtained to evaluate the vascular anatomy. Carotid stenosis measurements (when applicable) are obtained utilizing NASCET criteria, using the distal internal carotid diameter as the denominator. CONTRAST:  46mL OMNIPAQUE IOHEXOL 350 MG/ML SOLN COMPARISON:  Head CT 10/23/2019, report from CT angiogram neck 07/24/2008 (images unavailable), report from carotid artery duplex 09/07/2018. FINDINGS: CT HEAD FINDINGS Brain: Cerebral volume is normal for age. There is no acute intracranial hemorrhage. No demarcated cortical infarct is identified. No extra-axial fluid collection. No evidence of intracranial mass. No midline shift. Vascular: Reported below. Skull: Normal. Negative for fracture or focal lesion. Sinuses: No significant paranasal sinus disease or mastoid effusion at the imaged levels. Orbits: No acute abnormality. Review of the MIP images confirms the above findings CTA NECK FINDINGS Aortic arch: Common origin of the innominate and left common carotid arteries. Atherosclerotic plaque within  the visualized aortic arch and proximal major branch vessels of the neck. No hemodynamically significant innominate or proximal subclavian artery stenosis. Right carotid system: CCA and ICA patent within the neck. Mild scattered mixed plaque within the CCA. There is more prominent calcified plaque within the carotid bifurcation and proximal ICA. There is stenosis of the proximal ICA estimated at up to 50-60%, although this may be overestimated on the current exam due to blooming artifact from irregular calcified plaque. Left carotid system: The CCA and ICA are patent. There is a stent extending from the distal CCA into the proximal ICA. There is no hemodynamically significant stenosis within the CCA or cervical ICA proximal or distal to the stent. The stent is patent, although artifact limits evaluation for intra-stent stenosis. Vertebral arteries: The vertebral arteries are codominant. There is fairly prominent multifocal mixed plaque within the V1 and proximal V2 right vertebral artery with sites of moderate/severe stenosis. There is also calcified plaque within the distal cervical right vertebral artery at the C1-C2 level with up to mild/moderate stenosis at this site. The left vertebral artery is patent throughout the neck with high-grade stenosis at its origin. Skeleton: No acute bony abnormality or aggressive osseous lesion. Cervical spondylosis. Most notably at C3-C4, there is advanced disc space narrowing and a disc bulge with osteophyte ridge contributes to suspected at least moderate spinal canal stenosis. Other neck: No neck mass or cervical lymphadenopathy. Thyroid  unremarkable. Upper chest: No consolidation within the imaged lung apices. Prior median sternotomy. Partially imaged left chest multi lead AICD device. Partially imaged right IJ approach central venous catheter. Review of the MIP images confirms the above findings CTA HEAD FINDINGS Anterior circulation: The intracranial internal carotid  arteries are patent. Calcified plaque within both vessels. Sites of up to moderate stenosis within the paraclinoid right ICA. Sites of moderate stenosis within the cavernous left ICA. The M1 middle cerebral arteries are patent without significant stenosis. No M2 proximal branch occlusion or high-grade proximal arterial stenosis is identified. The anterior cerebral arteries are patent without significant proximal stenosis. No intracranial aneurysm is identified. Posterior circulation: The intracranial vertebral arteries are patent. Calcified plaque results in severe stenosis within the right V4 segment. The basilar artery is patent without significant stenosis. The posterior cerebral arteries are patent bilaterally. Moderate/severe stenosis within a P3 left PCA branch (series 13, image 32). Posterior communicating arteries are hypoplastic or absent bilaterally. Venous sinuses: Within limitations of contrast timing, no convincing thrombus. Anatomic variants: As described. Review of the MIP images confirms the above findings IMPRESSION: CT head: Unremarkable non-contrast CT appearance of the brain. No evidence of acute intracranial abnormality. CTA neck: 1. The right common and internal carotid arteries are patent within the neck. There is somewhat prominent calcified plaque within the carotid bifurcation and proximal ICA. Apparent stenosis of the proximal right ICA of up to 50-60%, although this stenosis may be overestimated due to blooming artifact from irregular calcified plaque at this site. Carotid artery duplex may be obtained for further evaluation, as clinically warranted. 2. The left common and internal carotids are patent within the neck, as is the left carotid stent. Artifact limits evaluation for intra-stent stenosis. 3. The right vertebral artery is patent. here are sites of moderate/severe atherosclerotic narrowing within the V1 and proximal V2 segments. Additionally, there is mild/moderate  atherosclerotic narrowing of this vessel at the C1-C2 level. 4. The left vertebral artery is patent. Severe atherosclerotic narrowing at the origin of this vessel. 5. Cervical spondylosis as described and greatest at C3-C4 where there is suspected at least moderate spinal canal stenosis. CTA head: 1. No intracranial large vessel occlusion. 2. Intracranial atherosclerotic disease with multifocal stenoses, most notably as follows. 3. Sites of up to moderate stenosis within the paraclinoid right ICA and cavernous left ICA. 4. High-grade stenosis within the V4 right vertebral artery. 5. Moderate/severe focal stenosis within a P3 left PCA branch vessel. Electronically Signed   By: Kellie Simmering DO   On: 10/31/2019 20:28   DG Chest 2 View  Result Date: 10/31/2019 CLINICAL DATA:  Syncope. EXAM: CHEST - 2 VIEW COMPARISON:  04/10/2019 FINDINGS: There is a well-positioned right-sided tunneled dialysis catheter. The heart size is stable but enlarged. The patient is status post prior median sternotomy. There is a dual chamber left-sided pacemaker/ICD with stable positioning of the leads. There is no pneumothorax. No significant pleural effusion. No focal infiltrate. There is no acute osseous abnormality. IMPRESSION: No active cardiopulmonary disease. Electronically Signed   By: Constance Holster M.D.   On: 10/31/2019 21:33   CT Angio Neck W and/or Wo Contrast  Result Date: 10/31/2019 CLINICAL DATA:  Headache, sudden, carotid/vertebral dissection suspected. Additional history provided: Syncopal episode patient reports he awoke in his wheelchair with headache and dizziness as well as sweating. EXAM: CT ANGIOGRAPHY HEAD AND NECK TECHNIQUE: Multidetector CT imaging of the head and neck was performed using the standard protocol during bolus administration of intravenous contrast.  Multiplanar CT image reconstructions and MIPs were obtained to evaluate the vascular anatomy. Carotid stenosis measurements (when applicable) are  obtained utilizing NASCET criteria, using the distal internal carotid diameter as the denominator. CONTRAST:  35mL OMNIPAQUE IOHEXOL 350 MG/ML SOLN COMPARISON:  Head CT 10/23/2019, report from CT angiogram neck 07/24/2008 (images unavailable), report from carotid artery duplex 09/07/2018. FINDINGS: CT HEAD FINDINGS Brain: Cerebral volume is normal for age. There is no acute intracranial hemorrhage. No demarcated cortical infarct is identified. No extra-axial fluid collection. No evidence of intracranial mass. No midline shift. Vascular: Reported below. Skull: Normal. Negative for fracture or focal lesion. Sinuses: No significant paranasal sinus disease or mastoid effusion at the imaged levels. Orbits: No acute abnormality. Review of the MIP images confirms the above findings CTA NECK FINDINGS Aortic arch: Common origin of the innominate and left common carotid arteries. Atherosclerotic plaque within the visualized aortic arch and proximal major branch vessels of the neck. No hemodynamically significant innominate or proximal subclavian artery stenosis. Right carotid system: CCA and ICA patent within the neck. Mild scattered mixed plaque within the CCA. There is more prominent calcified plaque within the carotid bifurcation and proximal ICA. There is stenosis of the proximal ICA estimated at up to 50-60%, although this may be overestimated on the current exam due to blooming artifact from irregular calcified plaque. Left carotid system: The CCA and ICA are patent. There is a stent extending from the distal CCA into the proximal ICA. There is no hemodynamically significant stenosis within the CCA or cervical ICA proximal or distal to the stent. The stent is patent, although artifact limits evaluation for intra-stent stenosis. Vertebral arteries: The vertebral arteries are codominant. There is fairly prominent multifocal mixed plaque within the V1 and proximal V2 right vertebral artery with sites of moderate/severe  stenosis. There is also calcified plaque within the distal cervical right vertebral artery at the C1-C2 level with up to mild/moderate stenosis at this site. The left vertebral artery is patent throughout the neck with high-grade stenosis at its origin. Skeleton: No acute bony abnormality or aggressive osseous lesion. Cervical spondylosis. Most notably at C3-C4, there is advanced disc space narrowing and a disc bulge with osteophyte ridge contributes to suspected at least moderate spinal canal stenosis. Other neck: No neck mass or cervical lymphadenopathy. Thyroid unremarkable. Upper chest: No consolidation within the imaged lung apices. Prior median sternotomy. Partially imaged left chest multi lead AICD device. Partially imaged right IJ approach central venous catheter. Review of the MIP images confirms the above findings CTA HEAD FINDINGS Anterior circulation: The intracranial internal carotid arteries are patent. Calcified plaque within both vessels. Sites of up to moderate stenosis within the paraclinoid right ICA. Sites of moderate stenosis within the cavernous left ICA. The M1 middle cerebral arteries are patent without significant stenosis. No M2 proximal branch occlusion or high-grade proximal arterial stenosis is identified. The anterior cerebral arteries are patent without significant proximal stenosis. No intracranial aneurysm is identified. Posterior circulation: The intracranial vertebral arteries are patent. Calcified plaque results in severe stenosis within the right V4 segment. The basilar artery is patent without significant stenosis. The posterior cerebral arteries are patent bilaterally. Moderate/severe stenosis within a P3 left PCA branch (series 13, image 32). Posterior communicating arteries are hypoplastic or absent bilaterally. Venous sinuses: Within limitations of contrast timing, no convincing thrombus. Anatomic variants: As described. Review of the MIP images confirms the above findings  IMPRESSION: CT head: Unremarkable non-contrast CT appearance of the brain. No evidence of acute intracranial  abnormality. CTA neck: 1. The right common and internal carotid arteries are patent within the neck. There is somewhat prominent calcified plaque within the carotid bifurcation and proximal ICA. Apparent stenosis of the proximal right ICA of up to 50-60%, although this stenosis may be overestimated due to blooming artifact from irregular calcified plaque at this site. Carotid artery duplex may be obtained for further evaluation, as clinically warranted. 2. The left common and internal carotids are patent within the neck, as is the left carotid stent. Artifact limits evaluation for intra-stent stenosis. 3. The right vertebral artery is patent. here are sites of moderate/severe atherosclerotic narrowing within the V1 and proximal V2 segments. Additionally, there is mild/moderate atherosclerotic narrowing of this vessel at the C1-C2 level. 4. The left vertebral artery is patent. Severe atherosclerotic narrowing at the origin of this vessel. 5. Cervical spondylosis as described and greatest at C3-C4 where there is suspected at least moderate spinal canal stenosis. CTA head: 1. No intracranial large vessel occlusion. 2. Intracranial atherosclerotic disease with multifocal stenoses, most notably as follows. 3. Sites of up to moderate stenosis within the paraclinoid right ICA and cavernous left ICA. 4. High-grade stenosis within the V4 right vertebral artery. 5. Moderate/severe focal stenosis within a P3 left PCA branch vessel. Electronically Signed   By: Kellie Simmering DO   On: 10/31/2019 20:28   ECHOCARDIOGRAM COMPLETE  Result Date: 11/01/2019    ECHOCARDIOGRAM REPORT   Patient Name:   Treydon Henricks. Date of Exam: 11/01/2019 Medical Rec #:  [id number]         Height:       69.0 in Accession #:    [id number]        Weight:       185.0 lb Date of Birth:  1965/05/20          BSA:          1.999 m Patient Age:    54  years          BP:           80/65 mmHg Patient Gender: M                 HR:           74 bpm. Exam Location:  [facility] Procedure: 2D Echo, Cardiac Doppler and Color Doppler Indications:     ventricular Tachycardia 147.2  History:         Patient has prior history of Echocardiogram examinations, most                  recent 05/25/2018. Previous Myocardial Infarction, Stroke; Risk                  Factors:Sleep Apnea and Hypertension. PVD.  Sonographer:     Sherrie Sport RDCS (AE) Referring Phys:  [id number] Cleaster Corin PATEL Diagnosing Phys: Kate Sable MD IMPRESSIONS  1. Left ventricular ejection fraction, by estimation, is 25 to 30%. The left ventricle has severely decreased function. The left ventricle demonstrates global hypokinesis. Left ventricular diastolic parameters are consistent with Grade I diastolic dysfunction (impaired relaxation).  2. Right ventricular systolic function is moderately reduced. The right ventricular size is normal. There is normal pulmonary artery systolic pressure.  3. The mitral valve is normal in structure. No evidence of mitral valve regurgitation.  4. The aortic valve is normal in structure. Aortic valve regurgitation is not visualized.  5. The inferior vena cava is normal in size with greater than  50% respiratory variability, suggesting right atrial pressure of 3 mmHg. FINDINGS  Left Ventricle: Left ventricular ejection fraction, by estimation, is 25 to 30%. The left ventricle has severely decreased function. The left ventricle demonstrates global hypokinesis. The left ventricular internal cavity size was normal in size. There is no left ventricular hypertrophy. Left ventricular diastolic parameters are consistent with Grade I diastolic dysfunction (impaired relaxation). Right Ventricle: The right ventricular size is normal. No increase in right ventricular wall thickness. Right ventricular systolic function is moderately reduced. There is normal pulmonary artery systolic pressure. The  tricuspid regurgitant velocity is 1.94 m/s, and with an assumed right atrial pressure of 3 mmHg, the estimated right ventricular systolic pressure is 07.3 mmHg. Left Atrium: Left atrial size was normal in size. Right Atrium: Right atrial size was normal in size. Pericardium: There is no evidence of pericardial effusion. Mitral Valve: The mitral valve is normal in structure. No evidence of mitral valve regurgitation. Tricuspid Valve: The tricuspid valve is grossly normal. Tricuspid valve regurgitation is not demonstrated. Aortic Valve: The aortic valve is normal in structure. Aortic valve regurgitation is not visualized. Aortic valve mean gradient measures 6.7 mmHg. Aortic valve peak gradient measures 12.7 mmHg. Aortic valve area, by VTI measures 1.72 cm. Pulmonic Valve: The pulmonic valve was not well visualized. Pulmonic valve regurgitation is not visualized. Aorta: The aortic root is normal in size and structure. Venous: The inferior vena cava is normal in size with greater than 50% respiratory variability, suggesting right atrial pressure of 3 mmHg. IAS/Shunts: No atrial level shunt detected by color flow Doppler. Additional Comments: A pacer wire is visualized.  LEFT VENTRICLE PLAX 2D LVIDd:         4.91 cm      Diastology LVIDs:         4.44 cm      LV e' lateral:   4.35 cm/s LV PW:         1.29 cm      LV E/e' lateral: 11.8 LV IVS:        1.08 cm      LV e' medial:    2.83 cm/s LVOT diam:     2.20 cm      LV E/e' medial:  18.2 LV SV:         46 LV SV Index:   23 LVOT Area:     3.80 cm  LV Volumes (MOD) LV vol d, MOD A2C: 136.0 ml LV vol d, MOD A4C: 126.0 ml LV vol s, MOD A2C: 98.3 ml LV vol s, MOD A4C: 73.4 ml LV SV MOD A2C:     37.7 ml LV SV MOD A4C:     126.0 ml LV SV MOD BP:      47.8 ml RIGHT VENTRICLE RV Basal diam:  3.38 cm RV S prime:     7.51 cm/s TAPSE (M-mode): 3.5 cm LEFT ATRIUM           Index       RIGHT ATRIUM           Index LA diam:      3.60 cm 1.80 cm/m  RA Area:     14.70 cm LA Vol  (A2C): 37.3 ml 18.66 ml/m RA Volume:   33.80 ml  16.91 ml/m LA Vol (A4C): 45.0 ml 22.52 ml/m  AORTIC VALVE                    PULMONIC VALVE AV Area (Vmax):  1.37 cm     PV Vmax:        0.79 m/s AV Area (Vmean):   1.29 cm     PV Peak grad:   2.5 mmHg AV Area (VTI):     1.72 cm     RVOT Peak grad: 2 mmHg AV Vmax:           178.00 cm/s AV Vmean:          113.667 cm/s AV VTI:            0.268 m AV Peak Grad:      12.7 mmHg AV Mean Grad:      6.7 mmHg LVOT Vmax:         64.30 cm/s LVOT Vmean:        38.500 cm/s LVOT VTI:          0.121 m LVOT/AV VTI ratio: 0.45  AORTA Ao Root diam: 2.70 cm MITRAL VALVE               TRICUSPID VALVE MV Area (PHT): 4.24 cm    TR Peak grad:   15.1 mmHg MV Decel Time: 179 msec    TR Vmax:        194.00 cm/s MV E velocity: 51.40 cm/s MV A velocity: 77.60 cm/s  SHUNTS MV E/A ratio:  0.66        Systemic VTI:  0.12 m                            Systemic Diam: 2.20 cm Kate Sable MD Electronically signed by Kate Sable MD Signature Date/Time: 11/01/2019/3:00:04 PM    Final      Medications:   . sodium chloride Stopped (11/01/19 0234)   . amiodarone  200 mg Oral Daily  . atorvastatin  40 mg Oral Daily  . calcium acetate  1,334 mg Oral TID WC  . Chlorhexidine Gluconate Cloth  6 each Topical Q0600  . clopidogrel  75 mg Oral Q breakfast  . collagenase   Topical Once per day on Tue Thu  . heparin  5,000 Units Subcutaneous Q8H  . insulin aspart  0-6 Units Subcutaneous TID WC  . midodrine  10 mg Oral Q M,W,F-HD  . predniSONE  5 mg Oral Q breakfast  . sodium chloride flush  3 mL Intravenous Q12H   sodium chloride, acetaminophen **OR** acetaminophen, HYDROcodone-acetaminophen  Assessment/ Plan:  [name] is a 54 y.o. black male with end stage renal disease, type 2 diabetes mellitus insulin dependent, diabetic neuropathy, hypertension, coronary artery disease status post CABG, systolic and diastolic congestive heart failure, status post AICD, CVA,  peripheral vascular disease status post right BKA who is admitted to [facility] on 10/31/2019 for Syncope [R55] Ventricular tachycardia ([place]) [I47.2] NSTEMI (non-ST elevated myocardial infarction) ([place]) [I21.4] ESRD on hemodialysis ([place]) [N18.6, Z99.2] Syncope, unspecified syncope type [R55] Type 2 diabetes mellitus without complication, without long-term current use of insulin ([place]) [E11.9] V tach ([place]) [I47.2]  [facility] MWF RIJ permcath 86kg  1. End stage renal disease on hemodialysis: emergent hemodialysis treatment on admission. Next treatment for Friday.   2. Hypotension:  Appreciate cardiology input.  - midodrine.   3. Anemia with chronic kidney disease: EPO with outpatient HD treatments.   4. Secondary Hyperparathyroidism:  - calcium acetate with meals.    LOS: 1 Hadassa Cermak 7/1/20213:25 PM

## 2019-11-02 NOTE — Consult Note (Signed)
NAME: Paul [name].  DOB: [birth year]  MRN: [id number]  Date/Time: 11/02/2019 3:12 PM  REQUESTING PROVIDER: Dr. Roosevelt Locks Subjective:  REASON FOR CONSULT: Endocarditis ? Paul [name]. is a 54 y.o. male with a history of diabetes mellitus, peripheral vascular disease, coronary artery disease, status post CABG and ICD placement end-stage renal disease on dialysis, carotid artery disease status post LICA stenting chronic b/l heel wound with osteomyelitis in 2020, s/p Rt BKA  Presents to the ED on 10/31/2019 after an episode of syncope.  As per patient after he completed dialysis on 10/30/2019 he felt nauseous.  And then the next day he was resting in his wheelchair at home and watching television when he suddenly passed out and when he woke up the wheelchair was spinning in circles and he was drenched in sweat and was having significant palpitation.  So he got himself to the bed lay on the bed and called EMS for assistance. As per EMS note patient was wide awake and oriented and EKG showed wide complex tachycardia.  Patient refused intravenous medication as he did not want to start on IV without a vein finder.  He was brought to the ED.  In the ED vitals where temperature of 98 blood pressure of 150/58 heart rate of 93 and pulse ox of 97%. EKG showed June 2021  March 2021   He was seen by Dr. Caryl Comes today.  Because of his history of nighttime drenching sweats for the past 3 months and having an ICD as well as dialysis catheter Dr. Delice Lesch wanted to rule out bacterial endocarditis or culture-negative endocarditis. Patient also recently had an accident in the transportation [employer] when a sudden break related to hit on the oxygen cylinder in which was in the front of the wheelchair and also he says his finger got caught. He is saw his PCP on 10/26/2019 for the ring finger lesion and he had given him doxycycline.  As per patient he has had it for at least 2 to 3 weeks and he thinks he could have burned his fingers  by making popcorn.  He has neuropathy in his hands and he does not have good sensation.  He is on chronic prednisone at a low dose of 5 mg.  The reason he is taking prednisone is for the pain in his fingers which is coming because of the compressed nerve in his neck. Patient has not had any fever. He has always been dizzy after dialysis.   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 AMPUTATION Right 12/23/2018   Procedure: AMPUTATION BELOW KNEE (Right);  Surgeon: Algernon Huxley, MD;  Location: [facility];  Service: General;  Laterality: Right;  . AMPUTATION TOE Right 08/22/2016   Procedure: AMPUTATION TOE;  Surgeon: Samara Deist, DPM;  Location: [facility];  Service: Podiatry;  Laterality: Right;  . AMPUTATION TOE Right 10/09/2016   Procedure: AMPUTATION TOE-RIGHT 2ND MPJ;  Surgeon: Samara Deist, DPM;  Location: [facility];  Service: Podiatry;  Laterality: Right;  . APLIGRAFT PLACEMENT Right 10/09/2016   Procedure: APLIGRAFT PLACEMENT;  Surgeon: Samara Deist, DPM;  Location: [facility];  Service: Podiatry;  Laterality: Right;  . AV FISTULA PLACEMENT Right 05/11/2019  Procedure: INSERTION OF ARTERIOVENOUS (AV) GORE-TEX GRAFT ARM;  Surgeon: Algernon Huxley, MD;  Location: [facility];  Service: Vascular;  Laterality: Right;  . CALCANEAL OSTEOTOMY Bilateral 07/28/2018   Procedure: RIGHT CALCANECTOMY BILATERAL DEBRIDEMENT OF ULCERS ON HEELS;  Surgeon: Albertine Patricia, DPM;  Location: [facility];  Service: Podiatry;  Laterality: Bilateral;  . CARDIAC DEFIBRILLATOR PLACEMENT  2011  . CAROTID ENDARTERECTOMY Left   . CHOLECYSTECTOMY  2010  . CORONARY ARTERY BYPASS GRAFT  2010   CABG  x 3   . DIALYSIS/PERMA CATHETER INSERTION N/A 10/07/2018   Procedure: DIALYSIS/PERMA CATHETER INSERTION;  Surgeon: Katha Cabal, MD;  Location: [place] [facility];  Service: Cardiovascular;  Laterality: N/A;  . GRAFT APPLICATION Right 3/71/6967   Procedure: FULL THICKNESS SKIN GRAFT-RIGHT FOOT;  Surgeon: Algernon Huxley, MD;  Location: [facility];  Service: Vascular;  Laterality: Right;  . HIP SURGERY Right 1994  . INCISION AND DRAINAGE Right 09/08/2018   Procedure: INCISION AND DRAINAGE - [facility] OF DEFECTIVE SKIN, SOFT TISSUE AND BONE;  Surgeon: Albertine Patricia, DPM;  Location: [facility];  Service: Podiatry;  Laterality: Right;  . INCISION AND DRAINAGE OF WOUND Right 08/22/2016   Procedure: IRRIGATION AND DEBRIDEMENT WOUND and wound vac placement;  Surgeon: Samara Deist, DPM;  Location: [facility];  Service: Podiatry;  Laterality: Right;  . IRRIGATION AND DEBRIDEMENT ABSCESS Right 11/24/2018   Procedure: IRRIGATION AND DEBRIDEMENT ABSCESS RIGHT FOOT, COMPLICATED, DIABETIC;  Surgeon: Albertine Patricia, DPM;  Location: [facility];  Service: Podiatry;  Laterality: Right;  . LOWER EXTREMITY ANGIOGRAPHY Right 08/24/2016   Procedure: Lower Extremity Angiography;  Surgeon: Algernon Huxley, MD;  Location: [place] [facility];  Service: Cardiovascular;  Laterality: Right;  . LOWER EXTREMITY ANGIOGRAPHY Right 07/27/2018   Procedure: RIGHT Lower Extremity Angiography;  Surgeon: Algernon Huxley, MD;  Location: [place] [facility];  Service: Cardiovascular;  Laterality: Right;  . LOWER EXTREMITY ANGIOGRAPHY Right 09/09/2018   Procedure: Lower Extremity Angiography;  Surgeon: Katha Cabal, MD;  Location: [place] [facility];  Service: Cardiovascular;  Laterality: Right;  . MASS EXCISION Right 07/18/2014   Procedure: EXCISION HETEROTOPIC BONE RIGHT HIP;  Surgeon: Frederik Pear, MD;  Location: [place];  Service: Orthopedics;  Laterality: Right;  . Open Heart Surgery  2010   x 3  . PERIPHERAL VASCULAR THROMBECTOMY  Right 07/31/2019   Procedure: PERIPHERAL VASCULAR THROMBECTOMY;  Surgeon: Algernon Huxley, MD;  Location: [place] [facility];  Service: Cardiovascular;  Laterality: Right;  . PERIPHERAL VASCULAR THROMBECTOMY Right 08/17/2019   Procedure: PERIPHERAL VASCULAR THROMBECTOMY;  Surgeon: Algernon Huxley, MD;  Location: [place] [facility];  Service: Cardiovascular;  Laterality: Right;  . PERIPHERAL VASCULAR THROMBECTOMY Right 09/14/2019   Procedure: PERIPHERAL VASCULAR THROMBECTOMY;  Surgeon: Algernon Huxley, MD;  Location: [place] [facility];  Service: Cardiovascular;  Laterality: Right;  . WOUND DEBRIDEMENT Right 10/09/2016   Procedure: DEBRIDEMENT WOUND;  Surgeon: Samara Deist, DPM;  Location: [facility];  Service: Podiatry;  Laterality: Right;    Social History   Socioeconomic History  . Marital status: Single    Spouse name: Not on file  . Number of children: Not on file  . Years of education: Not on file  . Highest education level: Not on file  Occupational History  . Not on file  Tobacco Use  . Smoking status: Never Smoker  . Smokeless tobacco: Never Used  Vaping Use  . Vaping Use: Never used  Substance and Sexual Activity  .  Alcohol use: No  . Drug use: Yes    Types: Marijuana  . Sexual activity: Not on file  Other Topics Concern  . Not on file  Social History Narrative   Lives at home alone   Social Determinants of Health   Financial Resource Strain:   . Difficulty of Paying Living Expenses:   Food Insecurity: Food Insecurity Present  . Worried About Charity fundraiser in the Last Year: Sometimes true  . Ran Out of Food in the Last Year: Never true  Transportation Needs: Unmet Transportation Needs  . Lack of Transportation (Medical): Yes  . Lack of Transportation (Non-Medical): Yes  Physical Activity:   . Days of Exercise per Week:   . Minutes of Exercise per Session:   Stress:   . Feeling of Stress :   Social Connections:   . Frequency of Communication with Friends and  Family:   . Frequency of Social Gatherings with Friends and Family:   . Attends Religious Services:   . Active Member of Clubs or Organizations:   . Attends Archivist Meetings:   Marland Kitchen Marital Status:   Intimate Partner Violence:   . Fear of Current or Ex-Partner:   . Emotionally Abused:   Marland Kitchen Physically Abused:   . Sexually Abused:     Family History  Problem Relation Age of Onset  . Hypertension Other   . Diabetes Other   . Diabetes Father   . Hyperlipidemia Father   . Hypertension Father   . Hyperlipidemia Sister   . Hypertension Sister   . Diabetes Sister   . Diabetes Brother   . Hypertension Brother   . Hyperlipidemia Brother    Allergies  Allergen Reactions  . Other Other (See Comments)    Cardiac Problems. Pt states he tolerates Toradol. Due to kidney and heart problems per pt  . Ibuprofen Other (See Comments)    Heart problems  . Baclofen Other (See Comments)  . Metformin Diarrhea  . Nsaids     Due to kidney and heart problems per pt  ? Current Facility-Administered Medications  Medication Dose Route Frequency Provider Last Rate Last Admin  . 0.9 %  sodium chloride infusion   Intravenous PRN Lenore Cordia, MD   Stopped at 11/01/19 0234  . acetaminophen (TYLENOL) tablet 650 mg  650 mg Oral Q6H PRN Lenore Cordia, MD   650 mg at 10/31/19 2249   Or  . acetaminophen (TYLENOL) suppository 650 mg  650 mg Rectal Q6H PRN Lenore Cordia, MD      . amiodarone (PACERONE) tablet 200 mg  200 mg Oral Daily Kate Sable, MD   200 mg at 11/02/19 0933  . atorvastatin (LIPITOR) tablet 40 mg  40 mg Oral Daily Kate Sable, MD   40 mg at 11/02/19 0933  . calcium acetate (PHOSLO) capsule 1,334 mg  1,334 mg Oral TID WC Kolluru, Sarath, MD   1,334 mg at 11/02/19 0933  . Chlorhexidine Gluconate Cloth 2 % PADS 6 each  6 each Topical Q0600 Kolluru, Sarath, MD      . clopidogrel (PLAVIX) tablet 75 mg  75 mg Oral Q breakfast Zada Finders R, MD   75 mg at 11/02/19  0933  . heparin injection 5,000 Units  5,000 Units Subcutaneous Q8H Patel, Roxanne Mins R, MD      . HYDROcodone-acetaminophen (NORCO/VICODIN) 5-325 MG per tablet 1 tablet  1 tablet Oral Q6H PRN Sharen Hones, MD   1 tablet at 11/01/19 1953  .  insulin aspart (novoLOG) injection 0-6 Units  0-6 Units Subcutaneous TID WC Lenore Cordia, MD   1 Units at 11/01/19 1751  . midodrine (PROAMATINE) tablet 10 mg  10 mg Oral Q M,W,F-HD Kate Sable, MD   10 mg at 11/01/19 1620  . predniSONE (DELTASONE) tablet 5 mg  5 mg Oral Q breakfast Sharen Hones, MD   5 mg at 11/02/19 0933  . sodium chloride flush (NS) 0.9 % injection 3 mL  3 mL Intravenous Q12H Zada Finders R, MD   3 mL at 11/01/19 2147     Abtx:  Anti-infectives (From admission, onward)   Start     Dose/Rate Route Frequency Ordered Stop   11/01/19 1300  doxycycline (VIBRA-TABS) tablet 100 mg  Status:  Discontinued        100 mg Oral Every 12 hours 11/01/19 1248 11/02/19 0937      REVIEW OF SYSTEMS:  Const: negative fever, negative chills, extensive drenching night sweats, weight loss of 100 pounds over the.  Of 2 years Eyes: negative diplopia or visual changes, negative eye pain ENT: negative coryza, negative sore throat Resp: negative cough, hemoptysis, dyspnea Cards: negative for chest pain, palpitations, lower extremity edema GU: negative for frequency, dysuria and hematuria GI: Negative for abdominal pain, diarrhea, bleeding, constipation Skin: As above Heme: negative for easy bruising and gum/nose bleeding MS: negative for myalgias, arthralgias, back pain and muscle weakness Neurolo:negative for headaches, has dizziness, vertigo, memory problems  Psych: negative for feelings of anxiety, depression  Endocrine: Has diabetes mellitus Allergy/Immunology-allergies as noted above but no antibiotic allergy.  ?  Objective:  VITALS:  BP 123/61 (BP Location: Left Arm)   Pulse 80   Temp 98.7 F (37.1 C) (Oral)   Resp 18   Ht 5\' 9"  (1.753  m)   Wt 83.9 kg   SpO2 100%   BMI 27.32 kg/m  PHYSICAL EXAM:  General: Alert, cooperative, no distress, appears stated age.  Head: Normocephalic, without obvious abnormality, atraumatic. Eyes: Conjunctivae clear, anicteric sclerae. Pupils are equal ENT Nares normal. No drainage or sinus tenderness. Lips, mucosa, and tongue normal. No Thrush Neck: Supple, symmetrical, no adenopathy, thyroid: non tender no carotid bruit and no JVD. Back: No CVA tenderness. Lungs: Clear to auscultation bilaterally. No Wheezing or Rhonchi. No rales. Heart: S1-S2 Chest wall ICD felt On the right side of the chest there is a tunneled permacath  Abdomen: Soft, non-tender,not distended. Bowel sounds normal. No masses Extremities: Right BKA Left heel wound is healed completely A small linear wound on the left shin does not look infected  Ischemic dry gangrene of the right ring finger    And ischemic blister on the right thumb   Claw hands Skin: As above Lymph: Cervical, supraclavicular normal. Neurologic: Did not examine in detail Pertinent Labs Lab Results CBC    Component Value Date/Time   WBC 15.2 (H) 11/02/2019 0547   RBC 4.00 (L) 11/02/2019 0547   HGB 12.6 (L) 11/02/2019 0547   HCT 38.0 (L) 11/02/2019 0547   PLT 270 11/02/2019 0547   MCV 95.0 11/02/2019 0547   MCH 31.5 11/02/2019 0547   MCHC 33.2 11/02/2019 0547   RDW 13.9 11/02/2019 0547   LYMPHSABS 1.3 11/02/2019 0547   MONOABS 1.2 (H) 11/02/2019 0547   EOSABS 0.0 11/02/2019 0547   BASOSABS 0.0 11/02/2019 0547    CMP Latest Ref Rng & Units 11/02/2019 11/01/2019 10/31/2019  Glucose 70 - 99 mg/dL 117(H) 159(H) 230(H)  BUN 6 - 20 mg/dL  43(H) 54(H) 48(H)  Creatinine 0.61 - 1.24 mg/dL 8.91(H) 11.28(H) 10.56(H)  Sodium 135 - 145 mmol/L 139 134(L) 136  Potassium 3.5 - 5.1 mmol/L 4.1 3.6 3.5  Chloride 98 - 111 mmol/L 100 99 99  CO2 22 - 32 mmol/L 26 23 21(L)  Calcium 8.9 - 10.3 mg/dL 7.8(L) 7.4(L) 7.6(L)  Total Protein 6.5 - 8.1 g/dL  - - 6.9  Total Bilirubin 0.3 - 1.2 mg/dL - - 0.6  Alkaline Phos 38 - 126 U/L - - 89  AST 15 - 41 U/L - - 15  ALT 0 - 44 U/L - - 16      Microbiology: Recent Results (from the past 240 hour(s))  SARS Coronavirus 2 by RT PCR (hospital order, performed in [facility] hospital lab) Nasopharyngeal Nasopharyngeal Swab     Status: None   Collection Time: 10/31/19  7:28 PM   Specimen: Nasopharyngeal Swab  Result Value Ref Range Status   SARS Coronavirus 2 NEGATIVE NEGATIVE Final    Comment: (NOTE) SARS-CoV-2 target nucleic acids are NOT DETECTED.  The SARS-CoV-2 RNA is generally detectable in upper and lower respiratory specimens during the acute phase of infection. The lowest concentration of SARS-CoV-2 viral copies this assay can detect is 250 copies / mL. A negative result does not preclude SARS-CoV-2 infection and should not be used as the sole basis for treatment or other patient management decisions.  A negative result may occur with improper specimen collection / handling, submission of specimen other than nasopharyngeal swab, presence of viral mutation(s) within the areas targeted by this assay, and inadequate number of viral copies (<250 copies / mL). A negative result must be combined with clinical observations, patient history, and epidemiological information.  Fact Sheet for Patients:   StrictlyIdeas.no  Fact Sheet for Healthcare Providers: BankingDealers.co.za  This test is not yet approved or  cleared by the [place] FDA and has been authorized for detection and/or diagnosis of SARS-CoV-2 by FDA under an Emergency Use Authorization (EUA).  This EUA will remain in effect (meaning this test can be used) for the duration of the COVID-19 declaration under Section 564(b)(1) of the Act, 21 U.S.C. section 360bbb-3(b)(1), unless the authorization is terminated or revoked sooner.  Performed at [facility], [address] [place], [place] [address]     IMAGING RESULTS:  Chest x-ray: No active cardiopulmonary disease CT angio neck and head with and without contrast  reviewed CTA neck: Prominent calcified plaque within the carotid bifurcation and proximal ICA on the right.  Apparent stenosis of the proximal right ICA up to 3 to 60%. The left common and internal carotids are patent The right vertebral artery is patent.  However there are sites of moderate to severe atherosclerotic narrowing within the V1 and proximal V2 segment.  Additionally there is mild to moderate atherosclerotic narrowing of this vessel at the C1-C2 level. The left vertebral artery is patent.  Severe atherosclerotic narrowing at the origin of this vessel.  Cervical spondylosis as described in greatest at C3-C4 where there is a spectrum of least moderate spinal canal stenosis.  CTA head High-grade stenosis within the V4 right vertebral artery.  Moderate to severe focal stenosis within a P3 left PCA branch vessel Sites of moderate stenosis within the paraclinoid right ICA and cavernous left ICA I have personally reviewed the films ? Impression/Recommendation ? ?54 year old male with history of end-stage renal disease, peripheral vascular disease, right BKA, congestive heart failure, CABG, AICD, presents after syncopal episode History  of night sweats for the past 3 months. Because of ischemic lesions on his fingers on the right hand as well as drenching sweats, having a dialysis catheter and an ICD valid concern for endocarditis raised by cardiologist.  Would need TEE to rule out vegetation.  Blood cultures have been sent but patient has been on doxycycline for the last week. If we cannot diagnose endocarditis currently then we can always get blood culture as outpatient off antibiotics during dialysis.  Patient is very stable and I do not see any reason to treat him empirically now.  History of wound in his feet with past history  of MRSA and MSSA infection.  Now he has no infection.  He has a right BKA.  The left heel wound is completely healed.  The small linear wound on his left shin status post an injury is not infected currently.  CAD status post CABG status post ICD Syncope had wide-complex tachycardia.  Being evaluated by cardiologist.  Currently on amiodarone, atorvastatin, midodrine for orthostasis.  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  He is on low-dose prednisone for the cervical spondylosis he says.  Recommend checking a cortisol level.  ___________________________________________________ Discussed with patient, requesting provider Note:  This document was prepared using Dragon voice recognition software and may include unintentional dictation errors.

## 2019-11-02 NOTE — Progress Notes (Signed)
Patient refusing sq heparin shots.  He states he "had a bad experience with the needle one time".  He doesn't mind receiving heparin when in dialysis but does not want to get poked with the needle to receive it.  MD aware.

## 2019-11-03 DIAGNOSIS — I25118 Atherosclerotic heart disease of native coronary artery with other forms of angina pectoris: Secondary | ICD-10-CM

## 2019-11-03 DIAGNOSIS — Z95810 Presence of automatic (implantable) cardiac defibrillator: Secondary | ICD-10-CM

## 2019-11-03 LAB — GLUCOSE, CAPILLARY
Glucose-Capillary: 111 mg/dL — ABNORMAL HIGH (ref 70–99)
Glucose-Capillary: 119 mg/dL — ABNORMAL HIGH (ref 70–99)
Glucose-Capillary: 195 mg/dL — ABNORMAL HIGH (ref 70–99)
Glucose-Capillary: 208 mg/dL — ABNORMAL HIGH (ref 70–99)

## 2019-11-03 LAB — CBC WITH DIFFERENTIAL/PLATELET
Abs Immature Granulocytes: 0.08 10*3/uL — ABNORMAL HIGH (ref 0.00–0.07)
Basophils Absolute: 0 10*3/uL (ref 0.0–0.1)
Basophils Relative: 0 %
Eosinophils Absolute: 0 10*3/uL (ref 0.0–0.5)
Eosinophils Relative: 0 %
HCT: 39.2 % (ref 39.0–52.0)
Hemoglobin: 13.2 g/dL (ref 13.0–17.0)
Immature Granulocytes: 1 %
Lymphocytes Relative: 12 %
Lymphs Abs: 1.7 10*3/uL (ref 0.7–4.0)
MCH: 31.4 pg (ref 26.0–34.0)
MCHC: 33.7 g/dL (ref 30.0–36.0)
MCV: 93.3 fL (ref 80.0–100.0)
Monocytes Absolute: 1 10*3/uL (ref 0.1–1.0)
Monocytes Relative: 7 %
Neutro Abs: 11.5 10*3/uL — ABNORMAL HIGH (ref 1.7–7.7)
Neutrophils Relative %: 80 %
Platelets: 288 10*3/uL (ref 150–400)
RBC: 4.2 MIL/uL — ABNORMAL LOW (ref 4.22–5.81)
RDW: 13.8 % (ref 11.5–15.5)
WBC: 14.4 10*3/uL — ABNORMAL HIGH (ref 4.0–10.5)
nRBC: 0 % (ref 0.0–0.2)

## 2019-11-03 LAB — BASIC METABOLIC PANEL
Anion gap: 17 — ABNORMAL HIGH (ref 5–15)
BUN: 57 mg/dL — ABNORMAL HIGH (ref 6–20)
CO2: 22 mmol/L (ref 22–32)
Calcium: 8.2 mg/dL — ABNORMAL LOW (ref 8.9–10.3)
Chloride: 97 mmol/L — ABNORMAL LOW (ref 98–111)
Creatinine, Ser: 10.65 mg/dL — ABNORMAL HIGH (ref 0.61–1.24)
GFR calc Af Amer: 6 mL/min — ABNORMAL LOW (ref >60)
GFR calc non Af Amer: 5 mL/min — ABNORMAL LOW (ref >60)
Glucose, Bld: 134 mg/dL — ABNORMAL HIGH (ref 70–99)
Potassium: 3.8 mmol/L (ref 3.5–5.1)
Sodium: 136 mmol/L (ref 135–145)

## 2019-11-03 NOTE — Care Management Important Message (Signed)
Important Message  Patient Details  Name: Paul [name]. MRN: [id number] Date of Birth: [birth year]   Medicare Important Message Given:  Yes  Initial Medicare IM given by Patient Access Associate on 11/02/2019 at 11:06am.     Dannette Barbara 11/03/2019, 8:39 AM

## 2019-11-03 NOTE — Consult Note (Signed)
ORTHOPAEDIC CONSULTATION  REQUESTING PHYSICIAN: Sharen Hones, MD  Chief Complaint: Left ankle wound  HPI: Paul [name]. is a 54 y.o. male admitted with concern for systemic infection.  He status post right BKA.  He has had a left ankle lower leg wound for about 2 months now.  He states he had an injury.  He feels like is getting better.  He states there has been some drainage.  Longstanding history of diabetes with peripheral vascular disease.  Past Medical History:  Diagnosis Date  . AICD (automatic cardioverter/defibrillator) present 2011  . Anemia   . Cardiac defibrillator in place    a. Biotronik LUmax 540 DRT, (ser # [id number]).  . Carotid arterial disease ([place])    a. s/p prior LICA stenting;  b. 11/1243 Carotid U/S: 40-59% bilat ICA stenosis. Patent LICA stent.  . CHF (congestive heart failure) ([place])   . CKD (chronic kidney disease), stage III   . Coronary artery disease    a. 2010 s/p CABG x 3.  . DDD (degenerative disc disease), lumbosacral    L5-S1  . Diabetes ([place])    Lantus at bedtime  . Dialysis patient [facility])    M,W,F  . Employs prosthetic leg    Right BKA  . Gangrene of toe of right foot ([place])   . Gout of left hand 10/06/2016  . HFrEF (heart failure with reduced ejection fraction) ([place])    a. 07/2016 Echo: EF 25-30%, diff HK, mild MR, mildly dil LA, mod reduced RV fxn, PASP 84mmHg.  Marland Kitchen Hypertension    takes Coreg daily  . Ischemic cardiomyopathy    a. 07/2016 Echo: EF 25-30%, diff HK.  . Myocardial infarction ([place]) 2009  . Peripheral vascular disease ([place])   . Sleep apnea    sleep study yr ago-unable to afford cpap  . Stroke [facility]) 09   no weakness   Past Surgical History:  Procedure Laterality Date  . AMPUTATION Right 12/23/2018   Procedure: AMPUTATION BELOW KNEE (Right);  Surgeon: Algernon Huxley, MD;  Location: [facility] ORS;  Service: General;  Laterality: Right;  . AMPUTATION TOE Right 08/22/2016   Procedure: AMPUTATION TOE;  Surgeon: Samara Deist, DPM;   Location: [facility] ORS;  Service: Podiatry;  Laterality: Right;  . AMPUTATION TOE Right 10/09/2016   Procedure: AMPUTATION TOE-RIGHT 2ND MPJ;  Surgeon: Samara Deist, DPM;  Location: [facility] ORS;  Service: Podiatry;  Laterality: Right;  . APLIGRAFT PLACEMENT Right 10/09/2016   Procedure: APLIGRAFT PLACEMENT;  Surgeon: Samara Deist, DPM;  Location: [facility] OR[facility];  Service: Podiatry;  Laterality: Right;  . AV FISTULA PLACEMENT Right 05/11/2019   Procedure: INSERTION OF ARTERIOVENOUS (AV) GORE-TEX GRAFT ARM;  Surgeon: Algernon Huxley, MD;  Location: [facility] ORS;  Service: Vascular;  Laterality: Right;  . CALCANEAL OSTEOTOMY Bilateral 07/28/2018   Procedure: RIGHT CALCANECTOMY BILATERAL DEBRIDEMENT OF ULCERS ON HEELS;  Surgeon: Albertine Patricia, DPM;  Location: [facility] OR[facility];  Service: Podiatry;  Laterality: Bilateral;  . CARDIAC DEFIBRILLATOR PLACEMENT  2011  . CAROTID ENDARTERECTOMY Left   . CHOLECYSTECTOMY  2010  . CORONARY ARTERY BYPASS GRAFT  2010   CABG x 3   . DIALYSIS/PERMA CATHETER INSERTION N/A 10/07/2018   Procedure: DIALYSIS/PERMA CATHETER INSERTION;  Surgeon: Katha Cabal, MD;  Location: [place] CV LAB;  Service: Cardiovascular;  Laterality: N/A;  . GRAFT APPLICATION Right 12/11/9831   Procedure: FULL THICKNESS SKIN GRAFT-RIGHT FOOT;  Surgeon: Algernon Huxley, MD;  Location: [facility];  Service: Vascular;  Laterality: Right;  .  HIP SURGERY Right 1994  . INCISION AND DRAINAGE Right 09/08/2018   Procedure: INCISION AND DRAINAGE - [place] OF DEFECTIVE SKIN, SOFT TISSUE AND BONE;  Surgeon: Albertine Patricia, DPM;  Location: [facility];  Service: Podiatry;  Laterality: Right;  . INCISION AND DRAINAGE OF WOUND Right 08/22/2016   Procedure: IRRIGATION AND DEBRIDEMENT WOUND and wound vac placement;  Surgeon: Samara Deist, DPM;  Location: [facility];  Service: Podiatry;  Laterality: Right;  . IRRIGATION AND DEBRIDEMENT ABSCESS Right 11/24/2018   Procedure: IRRIGATION AND DEBRIDEMENT ABSCESS RIGHT FOOT, COMPLICATED,  DIABETIC;  Surgeon: Albertine Patricia, DPM;  Location: [facility];  Service: Podiatry;  Laterality: Right;  . LOWER EXTREMITY ANGIOGRAPHY Right 08/24/2016   Procedure: Lower Extremity Angiography;  Surgeon: Algernon Huxley, MD;  Location: [place] [facility];  Service: Cardiovascular;  Laterality: Right;  . LOWER EXTREMITY ANGIOGRAPHY Right 07/27/2018   Procedure: RIGHT Lower Extremity Angiography;  Surgeon: Algernon Huxley, MD;  Location: [place] CV [facility];  Service: Cardiovascular;  Laterality: Right;  . LOWER EXTREMITY ANGIOGRAPHY Right 09/09/2018   Procedure: Lower Extremity Angiography;  Surgeon: Katha Cabal, MD;  Location: [place] [facility];  Service: Cardiovascular;  Laterality: Right;  . MASS EXCISION Right 07/18/2014   Procedure: EXCISION HETEROTOPIC BONE RIGHT HIP;  Surgeon: Frederik Pear, MD;  Location: [place];  Service: Orthopedics;  Laterality: Right;  . Open Heart Surgery  2010   x 3  . PERIPHERAL VASCULAR THROMBECTOMY Right 07/31/2019   Procedure: PERIPHERAL VASCULAR THROMBECTOMY;  Surgeon: Algernon Huxley, MD;  Location: [place] [facility];  Service: Cardiovascular;  Laterality: Right;  . PERIPHERAL VASCULAR THROMBECTOMY Right 08/17/2019   Procedure: PERIPHERAL VASCULAR THROMBECTOMY;  Surgeon: Algernon Huxley, MD;  Location: [place] [facility];  Service: Cardiovascular;  Laterality: Right;  . PERIPHERAL VASCULAR THROMBECTOMY Right 09/14/2019   Procedure: PERIPHERAL VASCULAR THROMBECTOMY;  Surgeon: Algernon Huxley, MD;  Location: [place] [facility];  Service: Cardiovascular;  Laterality: Right;  . WOUND DEBRIDEMENT Right 10/09/2016   Procedure: DEBRIDEMENT WOUND;  Surgeon: Samara Deist, DPM;  Location: [facility];  Service: Podiatry;  Laterality: Right;   Social History   Socioeconomic History  . Marital status: Single    Spouse name: Not on file  . Number of children: Not on file  . Years of education: Not on file  . Highest education level: Not on file  Occupational History  . Not  on file  Tobacco Use  . Smoking status: Never Smoker  . Smokeless tobacco: Never Used  Vaping Use  . Vaping Use: Never used  Substance and Sexual Activity  . Alcohol use: No  . Drug use: Yes    Types: Marijuana  . Sexual activity: Not on file  Other Topics Concern  . Not on file  Social History Narrative   Lives at home alone   Social Determinants of Health   Financial Resource Strain:   . Difficulty of Paying Living Expenses:   Food Insecurity: Food Insecurity Present  . Worried About Charity fundraiser in the Last Year: Sometimes true  . Ran Out of Food in the Last Year: Never true  Transportation Needs: Unmet Transportation Needs  . Lack of Transportation (Medical): Yes  . Lack of Transportation (Non-Medical): Yes  Physical Activity:   . Days of Exercise per Week:   . Minutes of Exercise per Session:   Stress:   . Feeling of Stress :   Social Connections:   . Frequency of Communication with  Friends and Family:   . Frequency of Social Gatherings with Friends and Family:   . Attends Religious Services:   . Active Member of Clubs or Organizations:   . Attends Archivist Meetings:   Marland Kitchen Marital Status:    Family History  Problem Relation Age of Onset  . Hypertension Other   . Diabetes Other   . Diabetes Father   . Hyperlipidemia Father   . Hypertension Father   . Hyperlipidemia Sister   . Hypertension Sister   . Diabetes Sister   . Diabetes Brother   . Hypertension Brother   . Hyperlipidemia Brother    Allergies  Allergen Reactions  . Other Other (See Comments)    Cardiac Problems. Pt states he tolerates Toradol. Due to kidney and heart problems per pt  . Ibuprofen Other (See Comments)    Heart problems  . Baclofen Other (See Comments)  . Metformin Diarrhea  . Nsaids     Due to kidney and heart problems per pt   Prior to Admission medications   Medication Sig Start Date End Date Taking? Authorizing Provider  allopurinol (ZYLOPRIM) 100 MG  tablet Take 1 tablet (100 mg total) by mouth daily. 08/02/18  Yes Fritzi Mandes, MD  amiodarone (PACERONE) 100 MG tablet Take 100 mg by mouth daily.  01/15/18  Yes [provider]  clopidogrel (PLAVIX) 75 MG tablet Take 1 tablet (75 mg total) by mouth daily with breakfast. 07/31/19  Yes Dew, Erskine Squibb, MD  doxycycline (VIBRAMYCIN) 100 MG capsule Take 100 mg by mouth 2 (two) times daily. 10/26/19  Yes [provider]  HYDROcodone-acetaminophen (NORCO) 5-325 MG tablet Take 1 tablet by mouth every 6 (six) hours as needed for moderate pain. Patient taking differently: TAKE 1 TABLET BY MOUTH TWICE DAILY AS NEEDED FOR PAIN FOR UP TO 15 DAYS 10/23/19  Yes Carrie Mew, MD  predniSONE (DELTASONE) 10 MG tablet Take 5 mg by mouth daily. TAKE 1 TABLET BY MOUTH EVERY DAY   Yes [provider]   ECHOCARDIOGRAM COMPLETE  Result Date: 11/01/2019    ECHOCARDIOGRAM REPORT   Patient Name:   Courvoisier Hamblen. Date of Exam: 11/01/2019 Medical Rec #:  [id number]         Height:       69.0 in Accession #:    [id number]        Weight:       185.0 lb Date of Birth:  [birth year]          BSA:          1.999 m Patient Age:    63 years          BP:           80/65 mmHg Patient Gender: M                 HR:           74 bpm. Exam Location:  [facility] Procedure: 2D Echo, Cardiac Doppler and Color Doppler Indications:     ventricular Tachycardia 147.2  History:         Patient has prior history of Echocardiogram examinations, most                  recent 05/25/2018. Previous Myocardial Infarction, Stroke; Risk                  Factors:Sleep Apnea and Hypertension. PVD.  Sonographer:     Sherrie Sport RDCS (AE) Referring  Phys:  [id number] Cleaster Corin PATEL Diagnosing Phys: Kate Sable MD IMPRESSIONS  1. Left ventricular ejection fraction, by estimation, is 25 to 30%. The left ventricle has severely decreased function. The left ventricle demonstrates global hypokinesis. Left ventricular diastolic parameters are consistent with  Grade I diastolic dysfunction (impaired relaxation).  2. Right ventricular systolic function is moderately reduced. The right ventricular size is normal. There is normal pulmonary artery systolic pressure.  3. The mitral valve is normal in structure. No evidence of mitral valve regurgitation.  4. The aortic valve is normal in structure. Aortic valve regurgitation is not visualized.  5. The inferior vena cava is normal in size with greater than 50% respiratory variability, suggesting right atrial pressure of 3 mmHg. FINDINGS  Left Ventricle: Left ventricular ejection fraction, by estimation, is 25 to 30%. The left ventricle has severely decreased function. The left ventricle demonstrates global hypokinesis. The left ventricular internal cavity size was normal in size. There is no left ventricular hypertrophy. Left ventricular diastolic parameters are consistent with Grade I diastolic dysfunction (impaired relaxation). Right Ventricle: The right ventricular size is normal. No increase in right ventricular wall thickness. Right ventricular systolic function is moderately reduced. There is normal pulmonary artery systolic pressure. The tricuspid regurgitant velocity is 1.94 m/s, and with an assumed right atrial pressure of 3 mmHg, the estimated right ventricular systolic pressure is 80.9 mmHg. Left Atrium: Left atrial size was normal in size. Right Atrium: Right atrial size was normal in size. Pericardium: There is no evidence of pericardial effusion. Mitral Valve: The mitral valve is normal in structure. No evidence of mitral valve regurgitation. Tricuspid Valve: The tricuspid valve is grossly normal. Tricuspid valve regurgitation is not demonstrated. Aortic Valve: The aortic valve is normal in structure. Aortic valve regurgitation is not visualized. Aortic valve mean gradient measures 6.7 mmHg. Aortic valve peak gradient measures 12.7 mmHg. Aortic valve area, by VTI measures 1.72 cm. Pulmonic Valve: The pulmonic  valve was not well visualized. Pulmonic valve regurgitation is not visualized. Aorta: The aortic root is normal in size and structure. Venous: The inferior vena cava is normal in size with greater than 50% respiratory variability, suggesting right atrial pressure of 3 mmHg. IAS/Shunts: No atrial level shunt detected by color flow Doppler. Additional Comments: A pacer wire is visualized.  LEFT VENTRICLE PLAX 2D LVIDd:         4.91 cm      Diastology LVIDs:         4.44 cm      LV e' lateral:   4.35 cm/s LV PW:         1.29 cm      LV E/e' lateral: 11.8 LV IVS:        1.08 cm      LV e' medial:    2.83 cm/s LVOT diam:     2.20 cm      LV E/e' medial:  18.2 LV SV:         46 LV SV Index:   23 LVOT Area:     3.80 cm  LV Volumes (MOD) LV vol d, MOD A2C: 136.0 ml LV vol d, MOD A4C: 126.0 ml LV vol s, MOD A2C: 98.3 ml LV vol s, MOD A4C: 73.4 ml LV SV MOD A2C:     37.7 ml LV SV MOD A4C:     126.0 ml LV SV MOD BP:      47.8 ml RIGHT VENTRICLE RV Basal diam:  3.38 cm RV S  prime:     7.51 cm/s TAPSE (M-mode): 3.5 cm LEFT ATRIUM           Index       RIGHT ATRIUM           Index LA diam:      3.60 cm 1.80 cm/m  RA Area:     14.70 cm LA Vol (A2C): 37.3 ml 18.66 ml/m RA Volume:   33.80 ml  16.91 ml/m LA Vol (A4C): 45.0 ml 22.52 ml/m  AORTIC VALVE                    PULMONIC VALVE AV Area (Vmax):    1.37 cm     PV Vmax:        0.79 m/s AV Area (Vmean):   1.29 cm     PV Peak grad:   2.5 mmHg AV Area (VTI):     1.72 cm     RVOT Peak grad: 2 mmHg AV Vmax:           178.00 cm/s AV Vmean:          113.667 cm/s AV VTI:            0.268 m AV Peak Grad:      12.7 mmHg AV Mean Grad:      6.7 mmHg LVOT Vmax:         64.30 cm/s LVOT Vmean:        38.500 cm/s LVOT VTI:          0.121 m LVOT/AV VTI ratio: 0.45  AORTA Ao Root diam: 2.70 cm MITRAL VALVE               TRICUSPID VALVE MV Area (PHT): 4.24 cm    TR Peak grad:   15.1 mmHg MV Decel Time: 179 msec    TR Vmax:        194.00 cm/s MV E velocity: 51.40 cm/s MV A velocity: 77.60  cm/s  SHUNTS MV E/A ratio:  0.66        Systemic VTI:  0.12 m                            Systemic Diam: 2.20 cm Kate Sable MD Electronically signed by Kate Sable MD Signature Date/Time: 11/01/2019/3:00:04 PM    Final     Positive ROS: All other systems have been reviewed and were otherwise negative with the exception of those mentioned in the HPI and as above.  12 point ROS was performed.  Physical Exam: General: Alert and oriented.  No apparent distress.  Vascular:  Left foot:Dorsalis Pedis:  diminished Posterior Tibial:  diminished  Right foot: Status post BKA  Neuro:absent protective sensation  Derm: Small healing superficial wound on the anterior aspect of the left lower leg above the ankle region.  See clinical picture.  No drainage.  Looks to be healing very nicely.  No concern for infection.  No edema.     Ortho/MS: Status post right BKA  Assessment: Superficial wound left lower leg no concern for infection  Plan: At this point no concern for infection to this left leg wound.  I did cover this with a padded absorbent dressing.  No signs of obvious drainage today.  This looks to be dry.  We will just recommend keeping this protected for now.  Do not suspect infection is from this wound at all.  Will sign off for now  but consult if needed in the future    Elesa Hacker, [place] Cell [phone]   11/03/2019 12:33 PM

## 2019-11-03 NOTE — Progress Notes (Signed)
[facility]  ROUNDING NOTE   Subjective:   Seen and examined on hemodialysis treatment. Tolerating treatment well. UF goal of 2 liters.  Midodrine given prior to dialysis.  All blood pressure medications held.     HEMODIALYSIS FLOWSHEET:  Blood Flow Rate (mL/min): 400 mL/min Arterial Pressure (mmHg): -170 mmHg Venous Pressure (mmHg): 190 mmHg Transmembrane Pressure (mmHg): 60 mmHg Ultrafiltration Rate (mL/min): 730 mL/min Dialysate Flow Rate (mL/min): 600 ml/min Conductivity: Machine : 14 Conductivity: Machine : 14 Dialysis Fluid Bolus: Normal Saline Bolus Amount (mL): 250 mL    Objective:  Vital signs in last 24 hours:  Temp:  [97.5 F (36.4 C)-98.7 F (37.1 C)] 98.6 F (37 C) (07/02 0930) Pulse Rate:  [78-88] 80 (07/02 1015) Resp:  [17-20] 19 (07/02 1015) BP: (88-172)/(58-75) 124/60 (07/02 1015) SpO2:  [94 %-100 %] 96 % (07/02 0930) Weight:  [84.6 kg] 84.6 kg (07/02 0504)  Weight change:  Filed Weights   10/31/19 1700 11/03/19 0504  Weight: 83.9 kg 84.6 kg    Intake/Output: I/O last 3 completed shifts: In: 240 [P.O.:240] Out: -    Intake/Output this shift:  No intake/output data recorded.  Physical Exam: General: NAD,   Head: Normocephalic, atraumatic. Moist oral mucosal membranes  Eyes: Anicteric, PERRL  Neck: Supple, trachea midline  Lungs:  Clear to auscultation  Heart: Regular rate and rhythm  Abdomen:  Soft, nontender,   Extremities:  Right BKA, no edema  Neurologic: Nonfocal, moving all four extremities  Skin: Left ankle ulcer  Access: RIJ permcath    Basic Metabolic Panel: Recent Labs  Lab 10/31/19 1745 10/31/19 1745 11/01/19 0442 11/02/19 0547 11/03/19 0437  NA 136  --  134* 139 136  K 3.5  --  3.6 4.1 3.8  CL 99  --  99 100 97*  CO2 21*  --  23 26 22   GLUCOSE 230*  --  159* 117* 134*  BUN 48*  --  54* 43* 57*  CREATININE 10.56*  --  11.28* 8.91* 10.65*  CALCIUM 7.6*   < > 7.4* 7.8* 8.2*  MG 2.2  --   --  2.2  --     < > = values in this interval not displayed.    Liver Function Tests: Recent Labs  Lab 10/31/19 1745  AST 15  ALT 16  ALKPHOS 89  BILITOT 0.6  PROT 6.9  ALBUMIN 3.5   No results for input(s): LIPASE, AMYLASE in the last 168 hours. No results for input(s): AMMONIA in the last 168 hours.  CBC: Recent Labs  Lab 10/31/19 1745 11/01/19 0442 11/02/19 0547 11/03/19 0437  WBC 13.7* 12.8* 15.2* 14.4*  NEUTROABS 11.2*  --  12.5* 11.5*  HGB 12.5* 12.2* 12.6* 13.2  HCT 38.6* 36.2* 38.0* 39.2  MCV 97.0 95.0 95.0 93.3  PLT 285 266 270 288    Cardiac Enzymes: No results for input(s): CKTOTAL, CKMB, CKMBINDEX, TROPONINI in the last 168 hours.  BNP: Invalid input(s): POCBNP  CBG: Recent Labs  Lab 11/02/19 0730 11/02/19 1131 11/02/19 1621 11/02/19 2050 11/03/19 0729  GLUCAP 88 129* 104* 222* 111*    Microbiology: Results for orders placed or performed during the hospital encounter of 10/31/19  SARS Coronavirus 2 by RT PCR (hospital order, performed in [facility] hospital lab) Nasopharyngeal Nasopharyngeal Swab     Status: None   Collection Time: 10/31/19  7:28 PM   Specimen: Nasopharyngeal Swab  Result Value Ref Range Status   SARS Coronavirus 2 NEGATIVE NEGATIVE Final  Comment: (NOTE) SARS-CoV-2 target nucleic acids are NOT DETECTED.  The SARS-CoV-2 RNA is generally detectable in upper and lower respiratory specimens during the acute phase of infection. The lowest concentration of SARS-CoV-2 viral copies this assay can detect is 250 copies / mL. A negative result does not preclude SARS-CoV-2 infection and should not be used as the sole basis for treatment or other patient management decisions.  A negative result may occur with improper specimen collection / handling, submission of specimen other than nasopharyngeal swab, presence of viral mutation(s) within the areas targeted by this assay, and inadequate number of viral copies (<250 copies / mL). A negative  result must be combined with clinical observations, patient history, and epidemiological information.  Fact Sheet for Patients:   StrictlyIdeas.no  Fact Sheet for Healthcare Providers: BankingDealers.co.za  This test is not yet approved or  cleared by the [place] FDA and has been authorized for detection and/or diagnosis of SARS-CoV-2 by FDA under an Emergency Use Authorization (EUA).  This EUA will remain in effect (meaning this test can be used) for the duration of the COVID-19 declaration under Section 564(b)(1) of the Act, 21 U.S.C. section 360bbb-3(b)(1), unless the authorization is terminated or revoked sooner.  Performed at [facility], [address] [place], [place] [address]   CULTURE, BLOOD (ROUTINE X 2) w Reflex to ID Panel     Status: None (Preliminary result)   Collection Time: 11/02/19 10:14 AM   Specimen: BLOOD  Result Value Ref Range Status   Specimen Description BLOOD LEFT ANTECUBITAL  Final   Special Requests   Final    BOTTLES DRAWN AEROBIC AND ANAEROBIC Blood Culture adequate volume   Culture   Final    NO GROWTH < 24 HOURS Performed at [facility], [address] [place], [place] [address]    Report Status PENDING  Incomplete  CULTURE, BLOOD (ROUTINE X 2) w Reflex to ID Panel     Status: None (Preliminary result)   Collection Time: 11/02/19  2:42 PM   Specimen: BLOOD  Result Value Ref Range Status   Specimen Description BLOOD BLOOD LEFT ARM  Final   Special Requests   Final    BOTTLES DRAWN AEROBIC AND ANAEROBIC Blood Culture adequate volume   Culture   Final    NO GROWTH < 24 HOURS Performed at [facility], [address], [place], [place] [address]    Report Status PENDING  Incomplete    Coagulation Studies: Recent Labs    10/31/19 1745  LABPROT 12.5  INR 1.0    Urinalysis: No results for input(s): COLORURINE, LABSPEC, PHURINE, GLUCOSEU, HGBUR,  BILIRUBINUR, KETONESUR, PROTEINUR, UROBILINOGEN, NITRITE, LEUKOCYTESUR in the last 72 hours.  Invalid input(s): APPERANCEUR    Imaging: ECHOCARDIOGRAM COMPLETE  Result Date: 11/01/2019    ECHOCARDIOGRAM REPORT   Patient Name:   Paul [name]. Date of Exam: 11/01/2019 Medical Rec #:  [id number]         Height:       69.0 in Accession #:    [id number]        Weight:       185.0 lb Date of Birth:  October 25, 1965          BSA:          1.999 m Patient Age:    62 years          BP:           80/65 mmHg Patient Gender: M  HR:           74 bpm. Exam Location:  [facility] Procedure: 2D Echo, Cardiac Doppler and Color Doppler Indications:     ventricular Tachycardia 147.2  History:         Patient has prior history of Echocardiogram examinations, most                  recent 05/25/2018. Previous Myocardial Infarction, Stroke; Risk                  Factors:Sleep Apnea and Hypertension. PVD.  Sonographer:     Sherrie Sport RDCS (AE) Referring Phys:  [id number] Cleaster Corin PATEL Diagnosing Phys: Kate Sable MD IMPRESSIONS  1. Left ventricular ejection fraction, by estimation, is 25 to 30%. The left ventricle has severely decreased function. The left ventricle demonstrates global hypokinesis. Left ventricular diastolic parameters are consistent with Grade I diastolic dysfunction (impaired relaxation).  2. Right ventricular systolic function is moderately reduced. The right ventricular size is normal. There is normal pulmonary artery systolic pressure.  3. The mitral valve is normal in structure. No evidence of mitral valve regurgitation.  4. The aortic valve is normal in structure. Aortic valve regurgitation is not visualized.  5. The inferior vena cava is normal in size with greater than 50% respiratory variability, suggesting right atrial pressure of 3 mmHg. FINDINGS  Left Ventricle: Left ventricular ejection fraction, by estimation, is 25 to 30%. The left ventricle has severely decreased function. The left ventricle  demonstrates global hypokinesis. The left ventricular internal cavity size was normal in size. There is no left ventricular hypertrophy. Left ventricular diastolic parameters are consistent with Grade I diastolic dysfunction (impaired relaxation). Right Ventricle: The right ventricular size is normal. No increase in right ventricular wall thickness. Right ventricular systolic function is moderately reduced. There is normal pulmonary artery systolic pressure. The tricuspid regurgitant velocity is 1.94 m/s, and with an assumed right atrial pressure of 3 mmHg, the estimated right ventricular systolic pressure is 62.1 mmHg. Left Atrium: Left atrial size was normal in size. Right Atrium: Right atrial size was normal in size. Pericardium: There is no evidence of pericardial effusion. Mitral Valve: The mitral valve is normal in structure. No evidence of mitral valve regurgitation. Tricuspid Valve: The tricuspid valve is grossly normal. Tricuspid valve regurgitation is not demonstrated. Aortic Valve: The aortic valve is normal in structure. Aortic valve regurgitation is not visualized. Aortic valve mean gradient measures 6.7 mmHg. Aortic valve peak gradient measures 12.7 mmHg. Aortic valve area, by VTI measures 1.72 cm. Pulmonic Valve: The pulmonic valve was not well visualized. Pulmonic valve regurgitation is not visualized. Aorta: The aortic root is normal in size and structure. Venous: The inferior vena cava is normal in size with greater than 50% respiratory variability, suggesting right atrial pressure of 3 mmHg. IAS/Shunts: No atrial level shunt detected by color flow Doppler. Additional Comments: A pacer wire is visualized.  LEFT VENTRICLE PLAX 2D LVIDd:         4.91 cm      Diastology LVIDs:         4.44 cm      LV e' lateral:   4.35 cm/s LV PW:         1.29 cm      LV E/e' lateral: 11.8 LV IVS:        1.08 cm      LV e' medial:    2.83 cm/s LVOT diam:     2.20 cm  LV E/e' medial:  18.2 LV SV:         46 LV SV  Index:   23 LVOT Area:     3.80 cm  LV Volumes (MOD) LV vol d, MOD A2C: 136.0 ml LV vol d, MOD A4C: 126.0 ml LV vol s, MOD A2C: 98.3 ml LV vol s, MOD A4C: 73.4 ml LV SV MOD A2C:     37.7 ml LV SV MOD A4C:     126.0 ml LV SV MOD BP:      47.8 ml RIGHT VENTRICLE RV Basal diam:  3.38 cm RV S prime:     7.51 cm/s TAPSE (M-mode): 3.5 cm LEFT ATRIUM           Index       RIGHT ATRIUM           Index LA diam:      3.60 cm 1.80 cm/m  RA Area:     14.70 cm LA Vol (A2C): 37.3 ml 18.66 ml/m RA Volume:   33.80 ml  16.91 ml/m LA Vol (A4C): 45.0 ml 22.52 ml/m  AORTIC VALVE                    PULMONIC VALVE AV Area (Vmax):    1.37 cm     PV Vmax:        0.79 m/s AV Area (Vmean):   1.29 cm     PV Peak grad:   2.5 mmHg AV Area (VTI):     1.72 cm     RVOT Peak grad: 2 mmHg AV Vmax:           178.00 cm/s AV Vmean:          113.667 cm/s AV VTI:            0.268 m AV Peak Grad:      12.7 mmHg AV Mean Grad:      6.7 mmHg LVOT Vmax:         64.30 cm/s LVOT Vmean:        38.500 cm/s LVOT VTI:          0.121 m LVOT/AV VTI ratio: 0.45  AORTA Ao Root diam: 2.70 cm MITRAL VALVE               TRICUSPID VALVE MV Area (PHT): 4.24 cm    TR Peak grad:   15.1 mmHg MV Decel Time: 179 msec    TR Vmax:        194.00 cm/s MV E velocity: 51.40 cm/s MV A velocity: 77.60 cm/s  SHUNTS MV E/A ratio:  0.66        Systemic VTI:  0.12 m                            Systemic Diam: 2.20 cm Kate Sable MD Electronically signed by Kate Sable MD Signature Date/Time: 11/01/2019/3:00:04 PM    Final      Medications:   . sodium chloride Stopped (11/01/19 0234)   . amiodarone  200 mg Oral Daily  . amoxicillin-clavulanate  500 mg Oral Daily  . atorvastatin  40 mg Oral Daily  . calcium acetate  1,334 mg Oral TID WC  . Chlorhexidine Gluconate Cloth  6 each Topical Q0600  . clopidogrel  75 mg Oral Q breakfast  . collagenase   Topical Once per day on Tue Thu  . heparin  5,000 Units Subcutaneous Q8H  .  insulin aspart  0-6 Units Subcutaneous  TID WC  . midodrine  10 mg Oral Q M,W,F-HD  . predniSONE  5 mg Oral Q breakfast  . sodium chloride flush  3 mL Intravenous Q12H   sodium chloride, acetaminophen **OR** acetaminophen, HYDROcodone-acetaminophen  Assessment/ Plan:  [name] is a 54 y.o. black male with end stage renal disease, type 2 diabetes mellitus insulin dependent, diabetic neuropathy, hypertension, coronary artery disease status post CABG, systolic and diastolic congestive heart failure, status post AICD, CVA, peripheral vascular disease status post right BKA who is admitted to [facility] on 10/31/2019 for Syncope [R55] Ventricular tachycardia ([place]) [I47.2] NSTEMI (non-ST elevated myocardial infarction) ([place]) [I21.4] ESRD on hemodialysis ([place]) [N18.6, Z99.2] Syncope, unspecified syncope type [R55] Type 2 diabetes mellitus without complication, without long-term current use of insulin ([place]) [E11.9] V tach ([place]) [I47.2]  [facility] MWF RIJ permcath 86kg  1. End stage renal disease on hemodialysis: emergent hemodialysis treatment on admission.  Seen and examined on hemodialysis treatment.   2. Hypotension: holding all anti-hypertensive agents. Orthostatic yesterday. Suspect a component of autonomic neuropathy.  Appreciate cardiology input. Blood cultures with no growth. Concern for underlying ICD and endocarditis infection.  TTE possibly next week - midodrine before dialysis.   3. Anemia with chronic kidney disease: Hemoglobin 13.2. EPO with outpatient HD treatments.   4. Secondary Hyperparathyroidism:  - calcium acetate with meals.    LOS: 2 Lorcan Shelp 7/2/202110:52 AM

## 2019-11-03 NOTE — Progress Notes (Signed)
Md notified. Pt just returned from HD, VSS, complaints of right hand pain. Pt is due to receive midodrine for low BP and BP is 106/58. MD orders RN to hold this dose of midodrine. RN will treat the pain and continue to assess.

## 2019-11-03 NOTE — Progress Notes (Signed)
This note also relates to the following rows which could not be included: Resp - Cannot attach notes to unvalidated device data BP - Cannot attach notes to unvalidated device data  Hd started  

## 2019-11-03 NOTE — Progress Notes (Signed)
Progress Note  Patient Name: Paul [name]. Date of Encounter: 11/03/2019  [facility] Cardiologist: New to [facility],  EP: Dr. Caryl Comes   Subjective   Dizziness much improved today " Was not that bad yesterday" Scheduled for hemodialysis today Rounded with hospitalist service, Discussed his necrotic appearing finger, also region on his thumb Reports trauma to the area in the past Also slow to heal left shin wound He does report occasionally able to express exudate from the shin wound   Inpatient Medications    Scheduled Meds:  amiodarone  200 mg Oral Daily   amoxicillin-clavulanate  500 mg Oral Daily   atorvastatin  40 mg Oral Daily   calcium acetate  1,334 mg Oral TID WC   Chlorhexidine Gluconate Cloth  6 each Topical Q0600   clopidogrel  75 mg Oral Q breakfast   collagenase   Topical Once per day on Tue Thu   heparin  5,000 Units Subcutaneous Q8H   insulin aspart  0-6 Units Subcutaneous TID WC   midodrine  10 mg Oral Q M,W,F-HD   predniSONE  5 mg Oral Q breakfast   sodium chloride flush  3 mL Intravenous Q12H   Continuous Infusions:  sodium chloride Stopped (11/01/19 0234)   PRN Meds: sodium chloride, acetaminophen **OR** acetaminophen, HYDROcodone-acetaminophen   Vital Signs    Vitals:   11/03/19 1230 11/03/19 1243 11/03/19 1308 11/03/19 1403  BP: (!) 110/55 119/64 (!) 106/58 135/74  Pulse: (!) 119 83 83 86  Resp: 14 15 17    Temp:  98.5 F (36.9 C) 98.2 F (36.8 C)   TempSrc:  Oral Oral   SpO2:   97%   Weight:      Height:        Intake/Output Summary (Last 24 hours) at 11/03/2019 1514 Last data filed at 11/03/2019 1243 Gross per 24 hour  Intake --  Output 899 ml  Net -899 ml   Last 3 Weights 11/03/2019 10/31/2019 10/31/2019  Weight (lbs) 186 lb 8 oz 185 lb 187 lb  Weight (kg) 84.596 kg 83.915 kg 84.823 kg      Telemetry    Normal sinus rhythm- Personally Reviewed  ECG     - Personally Reviewed  Physical Exam    Constitutional:  oriented to person, place, and time. No distress.  HENT:  Head: Grossly normal Eyes:  no discharge. No scleral icterus.  Neck: No JVD, no carotid bruits  Cardiovascular: Regular rate and rhythm, no murmurs appreciated Pulmonary/Chest: Clear to auscultation bilaterally, no wheezes or rails Abdominal: Soft.  no distension.  no tenderness.  Musculoskeletal: Normal range of motion Neurological:  normal muscle tone. Coordination normal. No atrophy Skin: Skin warm and dry Psychiatric: normal affect, pleasant   Labs    High Sensitivity Troponin:   Recent Labs  Lab 10/31/19 1745 10/31/19 1928 10/31/19 2201 11/01/19 0026  TROPONINIHS 110* 197* 408* 568*      Chemistry Recent Labs  Lab 10/31/19 1745 10/31/19 1745 11/01/19 0442 11/02/19 0547 11/03/19 0437  NA 136   < > 134* 139 136  K 3.5   < > 3.6 4.1 3.8  CL 99   < > 99 100 97*  CO2 21*   < > 23 26 22   GLUCOSE 230*   < > 159* 117* 134*  BUN 48*   < > 54* 43* 57*  CREATININE 10.56*   < > 11.28* 8.91* 10.65*  CALCIUM 7.6*   < > 7.4* 7.8* 8.2*  PROT 6.9  --   --   --   --  ALBUMIN 3.5  --   --   --   --   AST 15  --   --   --   --   ALT 16  --   --   --   --   ALKPHOS 89  --   --   --   --   BILITOT 0.6  --   --   --   --   GFRNONAA 5*   < > 5* 6* 5*  GFRAA 6*   < > 5* 7* 6*  ANIONGAP 16*   < > 12 13 17*   < > = values in this interval not displayed.     Hematology Recent Labs  Lab 11/01/19 0442 11/02/19 0547 11/03/19 0437  WBC 12.8* 15.2* 14.4*  RBC 3.81* 4.00* 4.20*  HGB 12.2* 12.6* 13.2  HCT 36.2* 38.0* 39.2  MCV 95.0 95.0 93.3  MCH 32.0 31.5 31.4  MCHC 33.7 33.2 33.7  RDW 14.2 13.9 13.8  PLT 266 270 288    BNPNo results for input(s): BNP, PROBNP in the last 168 hours.   DDimer No results for input(s): DDIMER in the last 168 hours.   Radiology    No results found.  Cardiac Studies   Defibrillator download   Patient Profile     54 y.o. male history ofischemic  cardiomyopathy, status post AICD, CKD stage III, CAD status post CABG, right BKA, end-stage renal disease on dialysis Monday Wednesday Friday who presents due to a syncopal episode, presenting by EMS with wide-complex tachycardia, converted spontaneously Device interrogation demonstrating nonsustained VT  Assessment & Plan    Syncope, history of VT,  Ischemic cardiomyopathy Nonsustained VT noted by EMS, self terminated on route Evaluated by EP, device reprogrammed to offer ATP for his sustained VT. amiodarone 200 daily -Telemetry reviewed, no further arrhythmia noted  Hypotension/orthostasis Treated with midodrine prior to hemodialysis Would recommend we also add midodrine 5 to 10 mg 3 times daily as needed for dizziness consistent with orthostasis  Night sweats Etiology unclear, blood cultures pending, ID has consulted Necrotic appearing fingers x2, also with wound left shin Unable to get transesophageal echo today, he has eating breakfast, echo technicians short staffed --We will plan for outpatient TEE if he is discharged this weekend --If he remains inpatient, consider transesophageal echo on Tuesday, July 6 --Certainly if blood cultures are positive would continue inpatient stay with transesophageal echo early next week  Necrotic fingers On 2 fingers, reports prior trauma Unable to exclude embolic phenomenon Augmentin started yesterday  End-stage renal disease on hemodialysis Having HD today  Cardiomyopathy, ischemic, s/p ICD, CABG Ejection fraction 25 to 30%, echocardiogram November 01, 2019 Moderately reduced RV function Fluid managed by hemodialysis On Plavix, Symptomatic orthostasis limiting heart failure medications  Right BKA History of PAD  Case discussed with hospitalist service, nephrology  Total encounter time more than 35 minutes  Greater than 50% was spent in counseling and coordination of care with the patient   For questions or updates, please contact [facility] Please consult [email or web address] for contact info under     Signed, Ida Rogue, MD  11/03/2019, 3:14 PM

## 2019-11-03 NOTE — Progress Notes (Signed)
This note also relates to the following rows which could not be included: Resp - Cannot attach notes to unvalidated device data  Hd completed

## 2019-11-03 NOTE — Progress Notes (Signed)
PROGRESS NOTE    Paul [name].  SLH:[id number] DOB: 03-21-1966 DOA: 10/31/2019 PCP: Tracie Harrier, MD   Chief complaint.  Dizziness. Brief Narrative:  Paul Mackis a 54 y.o.malewith medical history significant forCAD s/p CABG in 2010, ischemic cardiomyopathy, HFrEF (EF 25-30%), s/p AICD, ESRD on MWF HD, type 2 diabetes, paroxysmal atrial fibrillation (not currently on anticoagulation), history of CVA, carotid artery disease s/p LICA stenting, PVD s/p right BKA, hypotension, anemia of chronic disease, and DJD with chronic low back pain who presents to the ED for evaluation after a syncopal episode.  In the emergency room, patient had a ventricular tachycardia of 150, lasted about 5 minutes. Did not trigger her AICD firing. Patient had a CT angiogram of the head, showed  multifocal stenoses noted with moderate stenosis within paraclinoid right ICA cavernous left ICA, high-grade stenosis within V4 right vertebral artery, moderate/severe focal stenosis within P3 left PCA branch vessel. ICD interrogation showed abnormal polymorphic ventricular tachycardia. Cardiology consult was obtained. Patient was given amiodarone.  7/1. Patient had a hypotension after hemodialysis yesterday, was given IV fluid bolus, also giving increased dose of prednisone. He also received a dose of midodrine. Cardiology recommended to rule out acute bacteremia with ICD lead infection. Will obtain blood cultures, ID consulted.  7/2.  Obtain podiatry consult for left ankle wound.  Blood culture pending.    Assessment & Plan:   Principal Problem:   Syncope Active Problems:   Diabetes (HCC)   CAD (coronary artery disease)   Chronic systolic CHF (congestive heart failure) (HCC)   ESRD on dialysis ([facility])   AICD (automatic cardioverter/defibrillator) present   PAF (paroxysmal atrial fibrillation) (HCC)   Elevated troponin   Ventricular tachycardia (HCC)   V tach (HCC)   Idiopathic  hypotension  #1.  Syncope secondary to ventricular tachycardia with chronic systolic congestive heart failure.  Ejection fraction 25 to 30%. No recurrence.  Discussed with Dr. Rockey Situ from cardiology, he is considering TEE for possible endocarditis.  Patient had a significant night sweats, right 1st and 4th fingers necrosis and left ankle wound.  Blood culture is pending.  If culture negative, he may consider TEE as outpatient.  Currently no plan to replace the leads from AICD unless it is infected.  Also appreciated ID consult, no antibiotics is needed at this time until cultures available.  #2.  Hypotension. Blood pressure is much better today.  Anticipating low blood pressure after dialysis.  We will continue midodrine.  3.  Right 1st and the 4th finger necrosis. Could be secondary to ischemia from right AV fistula.  Another possibility is  thrombosis.  Discussed with cardiology, will refer patient to vascular surgeon after discharge.  4.  Left ankle wound. Does not look infected.  Will obtain podiatry consult.  5.  Elevated troponin. Likely secondary to hypotension or ventricular tachycardia.  Known plan for work-up per cardiology.  6.  End-stage renal disease on dialysis. Patient be dialyzed again today.  7.  Paroxysmal atrial fibrillation. Followed by cardiology.  8.  Type 2 diabetes. Continue current regimen.  9.  Anemia of chronic disease. Follow.   DVT prophylaxis:Heparin SQ Code Status:Full Family Communication:None Disposition Plan:  Patient came from:Home  Anticipated d/c place:Home  Barriers to d/c OR conditions which need to be met to effect a safe d/c:   Consultants:  Cardiology  Nephrology  ID  Procedures:HD Antimicrobials:None   Subjective: Patient no longer has any dizziness today.  No short of breath or cough.  No fever or  chills.  He still has some night sweats.   Objective: Vitals:   11/03/19 0728 11/03/19 0930 11/03/19 0945 11/03/19 1000  BP: (!) 172/75 (!) 170/71 (!) 142/58 130/60  Pulse: 82  (!) 34 (!) 33  Resp: 19 18 17 18   Temp: 98.5 F (36.9 C) 98.6 F (37 C)    TempSrc: Oral Oral    SpO2: 97% 96%    Weight:      Height:       No intake or output data in the 24 hours ending 11/03/19 1012 Filed Weights   10/31/19 1700 11/03/19 0504  Weight: 83.9 kg 84.6 kg    Examination:  General exam: Appears calm and comfortable  Respiratory system: Clear to auscultation. Respiratory effort normal. Cardiovascular system: Regular. No JVD, murmurs, rubs, gallops or clicks. No pedal edema. Gastrointestinal system: Abdomen is nondistended, soft and nontender. No organomegaly or masses felt. Normal bowel sounds heard. Central nervous system: Alert and oriented. No focal neurological deficits. Extremities: Right BKA, chronic wound left ankle.  Skin: No rashes, lesions or ulcers Psychiatry: Judgement and insight appear normal. Mood & affect appropriate.     Data Reviewed: I have personally reviewed following labs and imaging studies  CBC: Recent Labs  Lab 10/31/19 1745 11/01/19 0442 11/02/19 0547 11/03/19 0437  WBC 13.7* 12.8* 15.2* 14.4*  NEUTROABS 11.2*  --  12.5* 11.5*  HGB 12.5* 12.2* 12.6* 13.2  HCT 38.6* 36.2* 38.0* 39.2  MCV 97.0 95.0 95.0 93.3  PLT 285 266 270 889   Basic Metabolic Panel: Recent Labs  Lab 10/31/19 1745 11/01/19 0442 11/02/19 0547 11/03/19 0437  NA 136 134* 139 136  K 3.5 3.6 4.1 3.8  CL 99 99 100 97*  CO2 21* 23 26 22   GLUCOSE 230* 159* 117* 134*  BUN 48* 54* 43* 57*  CREATININE 10.56* 11.28* 8.91* 10.65*  CALCIUM 7.6* 7.4* 7.8* 8.2*  MG 2.2  --  2.2  --    GFR: Estimated Creatinine Clearance: 7.9 mL/min (A) (by C-G formula based on SCr of 10.65 mg/dL (H)). Liver Function Tests: Recent Labs  Lab 10/31/19 1745  AST 15  ALT 16  ALKPHOS 89  BILITOT  0.6  PROT 6.9  ALBUMIN 3.5   No results for input(s): LIPASE, AMYLASE in the last 168 hours. No results for input(s): AMMONIA in the last 168 hours. Coagulation Profile: Recent Labs  Lab 10/31/19 1745  INR 1.0   Cardiac Enzymes: No results for input(s): CKTOTAL, CKMB, CKMBINDEX, TROPONINI in the last 168 hours. BNP (last 3 results) No results for input(s): PROBNP in the last 8760 hours. HbA1C: Recent Labs    11/01/19 0442  HGBA1C 7.6*   CBG: Recent Labs  Lab 11/02/19 0730 11/02/19 1131 11/02/19 1621 11/02/19 2050 11/03/19 0729  GLUCAP 88 129* 104* 222* 111*   Lipid Profile: Recent Labs    11/02/19 0547  CHOL 188  HDL 50  LDLCALC 121*  TRIG 85  CHOLHDL 3.8   Thyroid Function Tests: No results for input(s): TSH, T4TOTAL, FREET4, T3FREE, THYROIDAB in the last 72 hours. Anemia Panel: No results for input(s): VITAMINB12, FOLATE, FERRITIN, TIBC, IRON, RETICCTPCT in the last 72 hours. Sepsis Labs: No results for input(s): PROCALCITON, LATICACIDVEN in the last 168 hours.  Recent Results (from the past 240 hour(s))  SARS Coronavirus 2 by RT PCR (hospital order, performed in [facility] hospital lab) Nasopharyngeal Nasopharyngeal Swab     Status: None   Collection Time: 10/31/19  7:28  PM   Specimen: Nasopharyngeal Swab  Result Value Ref Range Status   SARS Coronavirus 2 NEGATIVE NEGATIVE Final    Comment: (NOTE) SARS-CoV-2 target nucleic acids are NOT DETECTED.  The SARS-CoV-2 RNA is generally detectable in upper and lower respiratory specimens during the acute phase of infection. The lowest concentration of SARS-CoV-2 viral copies this assay can detect is 250 copies / mL. A negative result does not preclude SARS-CoV-2 infection and should not be used as the sole basis for treatment or other patient management decisions.  A negative result may occur with improper specimen collection / handling, submission of specimen other than nasopharyngeal swab, presence of  viral mutation(s) within the areas targeted by this assay, and inadequate number of viral copies (<250 copies / mL). A negative result must be combined with clinical observations, patient history, and epidemiological information.  Fact Sheet for Patients:   StrictlyIdeas.no  Fact Sheet for Healthcare Providers: BankingDealers.co.za  This test is not yet approved or  cleared by the [place] FDA and has been authorized for detection and/or diagnosis of SARS-CoV-2 by FDA under an Emergency Use Authorization (EUA).  This EUA will remain in effect (meaning this test can be used) for the duration of the COVID-19 declaration under Section 564(b)(1) of the Act, 21 U.S.C. section 360bbb-3(b)(1), unless the authorization is terminated or revoked sooner.  Performed at [facility], [address] [place], [place] [address]   CULTURE, BLOOD (ROUTINE X 2) w Reflex to ID Panel     Status: None (Preliminary result)   Collection Time: 11/02/19 10:14 AM   Specimen: BLOOD  Result Value Ref Range Status   Specimen Description BLOOD LEFT ANTECUBITAL  Final   Special Requests   Final    BOTTLES DRAWN AEROBIC AND ANAEROBIC Blood Culture adequate volume   Culture   Final    NO GROWTH < 24 HOURS Performed at [facility], [address] [place], [place] [address]    Report Status PENDING  Incomplete  CULTURE, BLOOD (ROUTINE X 2) w Reflex to ID Panel     Status: None (Preliminary result)   Collection Time: 11/02/19  2:42 PM   Specimen: BLOOD  Result Value Ref Range Status   Specimen Description BLOOD BLOOD LEFT ARM  Final   Special Requests   Final    BOTTLES DRAWN AEROBIC AND ANAEROBIC Blood Culture adequate volume   Culture   Final    NO GROWTH < 24 HOURS Performed at [facility], [address] [place],  [address]    Report Status PENDING  Incomplete         Radiology Studies: ECHOCARDIOGRAM  COMPLETE  Result Date: 11/01/2019    ECHOCARDIOGRAM REPORT   Patient Name:   Quy Lotts. Date of Exam: 11/01/2019 Medical Rec #:  [id number]         Height:       69.0 in Accession #:    [id number]        Weight:       185.0 lb Date of Birth:  09/12/1965          BSA:          1.999 m Patient Age:    41 years          BP:           80/65 mmHg Patient Gender: M                 HR:  74 bpm. Exam Location:  [facility] Procedure: 2D Echo, Cardiac Doppler and Color Doppler Indications:     ventricular Tachycardia 147.2  History:         Patient has prior history of Echocardiogram examinations, most                  recent 05/25/2018. Previous Myocardial Infarction, Stroke; Risk                  Factors:Sleep Apnea and Hypertension. PVD.  Sonographer:     Sherrie Sport RDCS (AE) Referring Phys:  [id number] Cleaster Corin PATEL Diagnosing Phys: Kate Sable MD IMPRESSIONS  1. Left ventricular ejection fraction, by estimation, is 25 to 30%. The left ventricle has severely decreased function. The left ventricle demonstrates global hypokinesis. Left ventricular diastolic parameters are consistent with Grade I diastolic dysfunction (impaired relaxation).  2. Right ventricular systolic function is moderately reduced. The right ventricular size is normal. There is normal pulmonary artery systolic pressure.  3. The mitral valve is normal in structure. No evidence of mitral valve regurgitation.  4. The aortic valve is normal in structure. Aortic valve regurgitation is not visualized.  5. The inferior vena cava is normal in size with greater than 50% respiratory variability, suggesting right atrial pressure of 3 mmHg. FINDINGS  Left Ventricle: Left ventricular ejection fraction, by estimation, is 25 to 30%. The left ventricle has severely decreased function. The left ventricle demonstrates global hypokinesis. The left ventricular internal cavity size was normal in size. There is no left ventricular hypertrophy. Left ventricular  diastolic parameters are consistent with Grade I diastolic dysfunction (impaired relaxation). Right Ventricle: The right ventricular size is normal. No increase in right ventricular wall thickness. Right ventricular systolic function is moderately reduced. There is normal pulmonary artery systolic pressure. The tricuspid regurgitant velocity is 1.94 m/s, and with an assumed right atrial pressure of 3 mmHg, the estimated right ventricular systolic pressure is 05.6 mmHg. Left Atrium: Left atrial size was normal in size. Right Atrium: Right atrial size was normal in size. Pericardium: There is no evidence of pericardial effusion. Mitral Valve: The mitral valve is normal in structure. No evidence of mitral valve regurgitation. Tricuspid Valve: The tricuspid valve is grossly normal. Tricuspid valve regurgitation is not demonstrated. Aortic Valve: The aortic valve is normal in structure. Aortic valve regurgitation is not visualized. Aortic valve mean gradient measures 6.7 mmHg. Aortic valve peak gradient measures 12.7 mmHg. Aortic valve area, by VTI measures 1.72 cm. Pulmonic Valve: The pulmonic valve was not well visualized. Pulmonic valve regurgitation is not visualized. Aorta: The aortic root is normal in size and structure. Venous: The inferior vena cava is normal in size with greater than 50% respiratory variability, suggesting right atrial pressure of 3 mmHg. IAS/Shunts: No atrial level shunt detected by color flow Doppler. Additional Comments: A pacer wire is visualized.  LEFT VENTRICLE PLAX 2D LVIDd:         4.91 cm      Diastology LVIDs:         4.44 cm      LV e' lateral:   4.35 cm/s LV PW:         1.29 cm      LV E/e' lateral: 11.8 LV IVS:        1.08 cm      LV e' medial:    2.83 cm/s LVOT diam:     2.20 cm      LV E/e' medial:  18.2 LV SV:  46 LV SV Index:   23 LVOT Area:     3.80 cm  LV Volumes (MOD) LV vol d, MOD A2C: 136.0 ml LV vol d, MOD A4C: 126.0 ml LV vol s, MOD A2C: 98.3 ml LV vol s, MOD  A4C: 73.4 ml LV SV MOD A2C:     37.7 ml LV SV MOD A4C:     126.0 ml LV SV MOD BP:      47.8 ml RIGHT VENTRICLE RV Basal diam:  3.38 cm RV S prime:     7.51 cm/s TAPSE (M-mode): 3.5 cm LEFT ATRIUM           Index       RIGHT ATRIUM           Index LA diam:      3.60 cm 1.80 cm/m  RA Area:     14.70 cm LA Vol (A2C): 37.3 ml 18.66 ml/m RA Volume:   33.80 ml  16.91 ml/m LA Vol (A4C): 45.0 ml 22.52 ml/m  AORTIC VALVE                    PULMONIC VALVE AV Area (Vmax):    1.37 cm     PV Vmax:        0.79 m/s AV Area (Vmean):   1.29 cm     PV Peak grad:   2.5 mmHg AV Area (VTI):     1.72 cm     RVOT Peak grad: 2 mmHg AV Vmax:           178.00 cm/s AV Vmean:          113.667 cm/s AV VTI:            0.268 m AV Peak Grad:      12.7 mmHg AV Mean Grad:      6.7 mmHg LVOT Vmax:         64.30 cm/s LVOT Vmean:        38.500 cm/s LVOT VTI:          0.121 m LVOT/AV VTI ratio: 0.45  AORTA Ao Root diam: 2.70 cm MITRAL VALVE               TRICUSPID VALVE MV Area (PHT): 4.24 cm    TR Peak grad:   15.1 mmHg MV Decel Time: 179 msec    TR Vmax:        194.00 cm/s MV E velocity: 51.40 cm/s MV A velocity: 77.60 cm/s  SHUNTS MV E/A ratio:  0.66        Systemic VTI:  0.12 m                            Systemic Diam: 2.20 cm Kate Sable MD Electronically signed by Kate Sable MD Signature Date/Time: 11/01/2019/3:00:04 PM    Final         Scheduled Meds: . amiodarone  200 mg Oral Daily  . amoxicillin-clavulanate  500 mg Oral Daily  . atorvastatin  40 mg Oral Daily  . calcium acetate  1,334 mg Oral TID WC  . Chlorhexidine Gluconate Cloth  6 each Topical Q0600  . clopidogrel  75 mg Oral Q breakfast  . collagenase   Topical Once per day on Tue Thu  . heparin  5,000 Units Subcutaneous Q8H  . insulin aspart  0-6 Units Subcutaneous TID WC  . midodrine  10 mg Oral Q M,W,F-HD  . predniSONE  5 mg Oral Q breakfast  . sodium chloride flush  3 mL Intravenous Q12H   Continuous Infusions: . sodium chloride Stopped  (11/01/19 0234)     LOS: 2 days    Time spent: 35 minutes    Sharen Hones, MD [facility]   To contact the attending provider between 7A-7P or the covering provider during after hours 7P-7A, please log into the web site [email or web address] and access using universal [place] password for that web site. If you do not have the password, please call the hospital operator.  11/03/2019, 10:12 AM

## 2019-11-03 NOTE — Progress Notes (Signed)
ID stable No more syncope No fever or sweats  O/e Awake and alert Stable Patient Vitals for the past 24 hrs:  BP Temp Temp src Pulse Resp SpO2 Weight  11/03/19 1525 (!) 97/46 (!) 97.3 F (36.3 C) Oral 80 18 95 % --  11/03/19 1403 135/74 -- -- 86 -- -- --  11/03/19 1308 (!) 106/58 98.2 F (36.8 C) Oral 83 17 97 % --  11/03/19 1243 119/64 98.5 F (36.9 C) Oral 83 15 -- --  11/03/19 1230 (!) 110/55 -- -- (!) 119 14 -- --  11/03/19 1215 (!) 109/46 -- -- (!) 25 (!) 22 -- --  11/03/19 1200 (!) 109/28 -- -- 83 19 -- --  11/03/19 1145 (!) 103/57 -- -- (!) 47 18 -- --  11/03/19 1130 (!) 97/23 -- -- 86 16 -- --  11/03/19 1115 (!) 115/57 -- -- -- 19 -- --  11/03/19 1100 (!) 137/115 -- -- 85 (!) 21 -- --  11/03/19 1045 (!) 119/104 -- -- -- 18 -- --  11/03/19 1030 (!) 83/43 -- -- -- (!) 24 -- --  11/03/19 1015 124/60 -- -- 80 19 -- --  11/03/19 1000 130/60 -- -- 88 18 -- --  11/03/19 0945 (!) 142/58 -- -- -- 17 -- --  11/03/19 0930 (!) 170/71 98.6 F (37 C) Oral -- 18 96 % --  11/03/19 0728 (!) 172/75 98.5 F (36.9 C) Oral 82 19 97 % --  11/03/19 0504 (!) 157/62 (!) 97.5 F (36.4 C) -- 79 20 98 % 84.6 kg  11/02/19 1945 (!) 145/70 -- -- 82 -- 94 % --  11/02/19 1945 (!) 145/70 -- -- 83 -- 97 % --  11/02/19 1943 (!) 88/72 98.3 F (36.8 C) -- 82 20 -- --    Awake and alert Chest CTA Hs 1s2 Rt ring finger ischemia Rt thumb area of ischemia         Rt arm AV graft Left eg wound - has no infection Rt BKA  .labs CBC Latest Ref Rng & Units 11/03/2019 11/02/2019 11/01/2019  WBC 4.0 - 10.5 K/uL 14.4(H) 15.2(H) 12.8(H)  Hemoglobin 13.0 - 17.0 g/dL 13.2 12.6(L) 12.2(L)  Hematocrit 39 - 52 % 39.2 38.0(L) 36.2(L)  Platelets 150 - 400 K/uL 288 270 266    CMP Latest Ref Rng & Units 11/03/2019 11/02/2019 11/01/2019  Glucose 70 - 99 mg/dL 134(H) 117(H) 159(H)  BUN 6 - 20 mg/dL 57(H) 43(H) 54(H)  Creatinine 0.61 - 1.24 mg/dL 10.65(H) 8.91(H) 11.28(H)  Sodium 135 - 145 mmol/L 136 139 134(L)   Potassium 3.5 - 5.1 mmol/L 3.8 4.1 3.6  Chloride 98 - 111 mmol/L 97(L) 100 99  CO2 22 - 32 mmol/L 22 26 23   Calcium 8.9 - 10.3 mg/dL 8.2(L) 7.8(L) 7.4(L)  Total Protein 6.5 - 8.1 g/dL - - -  Total Bilirubin 0.3 - 1.2 mg/dL - - -  Alkaline Phos 38 - 126 U/L - - -  AST 15 - 41 U/L - - -  ALT 0 - 44 U/L - - -    Blood culture Ng so far  Impression/recommedation 54 year old male with history of end-stage renal disease, peripheral vascular disease, right BKA, congestive heart failure, CABG, AICD, presents after syncopal episode History of night sweats for the past 3 months. Because of ischemic lesions on his fingers on the right hand as well as drenching sweats, having a dialysis catheter and an ICD valid concern for endocarditis raised by cardiologist.  Would need  TEE to rule out vegetation.  Blood cultures have been sent but patient has been on doxycycline for the last week. If we cannot diagnose endocarditis currently then we can always get blood culture as outpatient off antibiotics during dialysis.  Patient is very stable and I do not see any reason to treat him empirically now. Will follow him as OP  Rt Ring finger and thumb ischemia likely related to the AVGraft malfunction- currently started on augmentin- total of 7 days 05/11/19 Graft placement complicated by  Thrombosed right brachial artery to axillary vein arteriovenous graft needing Mechanical thrombectomy to the right brachial artery to axillary vein AV graft with the penumbra CAT 7D catheter  [place] embolectomy for residual arterial plug  Percutaneous transluminal angioplasty of mid and distal graft and axillary vein with 7 mm diameter angioplasty balloon on 3/29 and 08/17/19 Recommend vascular opinion   History of wound in his feet with past history of MRSA and MSSA infection.  Now he has no infection.  He has a right BKA.  The left heel wound is completely healed.  The small linear wound on his left shin status post an injury  is not infected currently.  CAD status post CABG status post ICD Syncope had wide-complex tachycardia.  Being evaluated by cardiologist.  Currently on amiodarone, atorvastatin, midodrine for orthostasis.  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  He is on low-dose prednisone for the cervical spondylosis he says.  Recommend checking a cortisol level.    ID will follow him peripherally this weekend- If discharged he can be seen in my clinic in 2 weeks

## 2019-11-04 DIAGNOSIS — S91001A Unspecified open wound, right ankle, initial encounter: Secondary | ICD-10-CM

## 2019-11-04 LAB — CBC WITH DIFFERENTIAL/PLATELET
Abs Immature Granulocytes: 0.07 10*3/uL (ref 0.00–0.07)
Basophils Absolute: 0 10*3/uL (ref 0.0–0.1)
Basophils Relative: 0 %
Eosinophils Absolute: 0 10*3/uL (ref 0.0–0.5)
Eosinophils Relative: 0 %
HCT: 40.6 % (ref 39.0–52.0)
Hemoglobin: 13 g/dL (ref 13.0–17.0)
Immature Granulocytes: 1 %
Lymphocytes Relative: 11 %
Lymphs Abs: 1.5 10*3/uL (ref 0.7–4.0)
MCH: 31.4 pg (ref 26.0–34.0)
MCHC: 32 g/dL (ref 30.0–36.0)
MCV: 98.1 fL (ref 80.0–100.0)
Monocytes Absolute: 1 10*3/uL (ref 0.1–1.0)
Monocytes Relative: 7 %
Neutro Abs: 11.8 10*3/uL — ABNORMAL HIGH (ref 1.7–7.7)
Neutrophils Relative %: 81 %
Platelets: 273 10*3/uL (ref 150–400)
RBC: 4.14 MIL/uL — ABNORMAL LOW (ref 4.22–5.81)
RDW: 13.7 % (ref 11.5–15.5)
WBC: 14.5 10*3/uL — ABNORMAL HIGH (ref 4.0–10.5)
nRBC: 0 % (ref 0.0–0.2)

## 2019-11-04 LAB — BASIC METABOLIC PANEL
Anion gap: 15 (ref 5–15)
BUN: 46 mg/dL — ABNORMAL HIGH (ref 6–20)
CO2: 24 mmol/L (ref 22–32)
Calcium: 8.4 mg/dL — ABNORMAL LOW (ref 8.9–10.3)
Chloride: 96 mmol/L — ABNORMAL LOW (ref 98–111)
Creatinine, Ser: 8.74 mg/dL — ABNORMAL HIGH (ref 0.61–1.24)
GFR calc Af Amer: 7 mL/min — ABNORMAL LOW (ref >60)
GFR calc non Af Amer: 6 mL/min — ABNORMAL LOW (ref >60)
Glucose, Bld: 133 mg/dL — ABNORMAL HIGH (ref 70–99)
Potassium: 4.7 mmol/L (ref 3.5–5.1)
Sodium: 135 mmol/L (ref 135–145)

## 2019-11-04 LAB — GLUCOSE, CAPILLARY
Glucose-Capillary: 128 mg/dL — ABNORMAL HIGH (ref 70–99)
Glucose-Capillary: 192 mg/dL — ABNORMAL HIGH (ref 70–99)

## 2019-11-04 MED ORDER — CALCIUM ACETATE (PHOS BINDER) 667 MG PO CAPS: 1334.0000 mg | ORAL_CAPSULE | Freq: Three times a day (TID) | ORAL | 0 refills | Status: DC

## 2019-11-04 MED ORDER — MIDODRINE HCL 10 MG PO TABS: 10.0000 mg | ORAL_TABLET | ORAL | 0 refills | Status: DC

## 2019-11-04 MED ORDER — AMOXICILLIN-POT CLAVULANATE 500-125 MG PO TABS: 500.0000 mg | ORAL_TABLET | Freq: Every day | ORAL | 0 refills | Status: AC

## 2019-11-04 MED ORDER — ATORVASTATIN CALCIUM 40 MG PO TABS: 40.0000 mg | ORAL_TABLET | Freq: Every day | ORAL | 0 refills | Status: DC

## 2019-11-04 NOTE — Discharge Instructions (Signed)
1.  Follow-up with the vascular surgeon on Tuesday for ligation of AV fistula. 2.  Follow-up with PCP in 1 week. 3.  Follow-up with cardiology for scheduled TEE. 4.  Follow-up with infectious disease in 1 to 2 weeks.

## 2019-11-04 NOTE — Progress Notes (Addendum)
Progress Note  Patient Name: Paul [name]. Date of Encounter: 11/04/2019  [facility] Cardiologist: Shanon Ace (EP)  Subjective   Dizziness improved; he attributes symptoms to excessive fluid removal with HD.  Midodrine has also helped.  No CP, shortness of breath, or palpitations.  Inpatient Medications    Scheduled Meds: . amiodarone  200 mg Oral Daily  . amoxicillin-clavulanate  500 mg Oral Daily  . atorvastatin  40 mg Oral Daily  . calcium acetate  1,334 mg Oral TID WC  . Chlorhexidine Gluconate Cloth  6 each Topical Q0600  . clopidogrel  75 mg Oral Q breakfast  . collagenase   Topical Once per day on Tue Thu  . heparin  5,000 Units Subcutaneous Q8H  . insulin aspart  0-6 Units Subcutaneous TID WC  . midodrine  10 mg Oral Q M,W,F-HD  . predniSONE  5 mg Oral Q breakfast  . sodium chloride flush  3 mL Intravenous Q12H   Continuous Infusions: . sodium chloride Stopped (11/01/19 0234)   PRN Meds: sodium chloride, acetaminophen **OR** acetaminophen, HYDROcodone-acetaminophen   Vital Signs    Vitals:   11/03/19 1525 11/03/19 1935 11/04/19 0409 11/04/19 0738  BP: (!) 97/46 (!) 117/56 (!) 152/49 (!) 144/59  Pulse: 80 76 78 76  Resp: 18 20 20 18   Temp: (!) 97.3 F (36.3 C) 98 F (36.7 C) 99 F (37.2 C) 97.8 F (36.6 C)  TempSrc: Oral Oral Oral Oral  SpO2: 95% 98% 96% 97%  Weight:   84.8 kg   Height:        Intake/Output Summary (Last 24 hours) at 11/04/2019 0954 Last data filed at 11/04/2019 0416 Gross per 24 hour  Intake --  Output 899 ml  Net -899 ml   Last 3 Weights 11/04/2019 11/03/2019 10/31/2019  Weight (lbs) 187 lb 186 lb 8 oz 185 lb  Weight (kg) 84.823 kg 84.596 kg 83.915 kg      Telemetry    VP - Personally Reviewed  ECG    No new tracing.  Physical Exam   GEN: No acute distress.   Neck: No JVD Cardiac: RRR, no murmurs, rubs, or gallops.  Respiratory: Clear to auscultation bilaterally. GI: Soft, nontender, non-distended  MS: No  LLE edema; patient is s/p right BKA.  Blackening of right fingers noted. Neuro:  Nonfocal  Psych: Normal affect   Labs    High Sensitivity Troponin:   Recent Labs  Lab 10/31/19 1745 10/31/19 1928 10/31/19 2201 11/01/19 0026  TROPONINIHS 110* 197* 408* 568*      Chemistry Recent Labs  Lab 10/31/19 1745 11/01/19 0442 11/02/19 0547 11/03/19 0437 11/04/19 0723  NA 136   < > 139 136 135  K 3.5   < > 4.1 3.8 4.7  CL 99   < > 100 97* 96*  CO2 21*   < > 26 22 24   GLUCOSE 230*   < > 117* 134* 133*  BUN 48*   < > 43* 57* 46*  CREATININE 10.56*   < > 8.91* 10.65* 8.74*  CALCIUM 7.6*   < > 7.8* 8.2* 8.4*  PROT 6.9  --   --   --   --   ALBUMIN 3.5  --   --   --   --   AST 15  --   --   --   --   ALT 16  --   --   --   --   ALKPHOS 89  --   --   --   --  BILITOT 0.6  --   --   --   --   GFRNONAA 5*   < > 6* 5* 6*  GFRAA 6*   < > 7* 6* 7*  ANIONGAP 16*   < > 13 17* 15   < > = values in this interval not displayed.     Hematology Recent Labs  Lab 11/02/19 0547 11/03/19 0437 11/04/19 0723  WBC 15.2* 14.4* 14.5*  RBC 4.00* 4.20* 4.14*  HGB 12.6* 13.2 13.0  HCT 38.0* 39.2 40.6  MCV 95.0 93.3 98.1  MCH 31.5 31.4 31.4  MCHC 33.2 33.7 32.0  RDW 13.9 13.8 13.7  PLT 270 288 273    BNPNo results for input(s): BNP, PROBNP in the last 168 hours.   DDimer No results for input(s): DDIMER in the last 168 hours.   Radiology    No results found.  Cardiac Studies   TTE (11/01/19): 1. Left ventricular ejection fraction, by estimation, is 25 to 30%. The  left ventricle has severely decreased function. The left ventricle  demonstrates global hypokinesis. Left ventricular diastolic parameters are  consistent with Grade I diastolic  dysfunction (impaired relaxation).  2. Right ventricular systolic function is moderately reduced. The right  ventricular size is normal. There is normal pulmonary artery systolic  pressure.  3. The mitral valve is normal in structure. No  evidence of mitral valve  regurgitation.  4. The aortic valve is normal in structure. Aortic valve regurgitation is  not visualized.  5. The inferior vena cava is normal in size with greater than 50%  respiratory variability, suggesting right atrial pressure of 3 mmHg.   Patient Profile     54 y.o. male with h/o CAD s/p CABG, chronic HFrEF due to ICM s/p ICD, ESRD, and PAD, admitted with syncope and ventricular tachycardia.  Assessment & Plan    Syncope/ventricular tachycardia: No recurrence noted.  Device reprogrammed to offer ATP.  Continue amiodarone 200 mg daily.  Patient not on beta-blocker due to symptomatic hypotension.  Continue with prn midodrine.  [name] was reminding to refrain from driving.  Coronary artery disease: No sx to suggest worsening coronary insufficiency.  Continue clopidogrel and atorvastatin for secondary prevention.  Labile blood pressure: Chronic with symptomatic hypotension reported during hemodialysis.  Continue midodrine with HD.  Consider adding low-dose beta blocker if blood pressure on non-HD days remains consistently elevated.  Chronic HFrEF: LVEF still severely reduced.  Patient not on any evidence based HF therapy (BB, ACEI/ARB) in the setting of labile BP and symptomatic hypotension, as well as ESRD.  Defer challenging with beta blocker and ACEI/ARB given severe orthostatic hypotension exacerbated by fluid removal with HD.  ESRD: Per nephrology.  Finger ischemia and night sweats: Blood cultures negative thus far, though certainly [name] is at risk for endocarditis.  TEE has been recommended and can be performed next week on an outpatient basis if [name] is no longer hospitalized.  Tentatively plan for outpatient TEE next Friday (11/10/19) with Dr. Rockey Situ.  Risks and benefits of the procedure with [name] were discussed and he agrees to proceed.  I agree with vascular evaluation, as digital ischemia may reflect emboli  from recent instrumentation of right arm HD fistula.  If no clear etiology is identified, consider outpatient derm consultation with biopsy to exclude calciphylaxis.     For questions or updates, please contact [place] Please consult [email or web address] for contact info under [facility].     Signed, Nelva Bush, MD  11/04/2019, 9:54 AM

## 2019-11-04 NOTE — Discharge Summary (Signed)
Physician Discharge Summary  Patient ID: Paul [name]. MRN: [id number] DOB/AGE: [birth year] 54 y.o.  Admit date: 10/31/2019 Discharge date: 11/04/2019  Admission Diagnoses:  Discharge Diagnoses:  Principal Problem:   Syncope Active Problems:   Diabetes ([place])   CAD (coronary artery disease)   Chronic systolic CHF (congestive heart failure) (H[facility])   ESRD on dialysis ([place])   AICD (automatic cardioverter/defibrillator) present   PAF (paroxysmal atrial fibrillation) (H[facility])   Elevated troponin   Ventricular tachycardia ([facility])   V tach ([facility])   Idiopathic hypotension   Open wound of ankle without complication, right, initial encounter   Discharged Condition: good  Hospital Course:  Paul Mackis a 54 y.o.malewith medical history significant forCAD s/p CABG in 2010, ischemic cardiomyopathy, HFrEF (EF 25-30%), s/p AICD, ESRD on MWF HD, type 2 diabetes, paroxysmal atrial fibrillation (not currently on anticoagulation), history of CVA, carotid artery disease s/p LICA stenting, PVD s/p right BKA, hypotension, anemia of chronic disease, and DJD with chronic low back pain who presents to the ED for evaluation after a syncopal episode.  In the emergency room, patient had a ventricular tachycardia of 150, lasted about 5 minutes. Did not trigger her AICD firing. Patient had a CT angiogram of the head, showed  multifocal stenoses noted with moderate stenosis within paraclinoid right ICA cavernous left ICA, high-grade stenosis within V4 right vertebral artery, moderate/severe focal stenosis within P3 left PCA branch vessel. ICD interrogation showed abnormal polymorphic ventricular tachycardia. Cardiology consult was obtained. Patient was given amiodarone.  7/1.Patient had a hypotension after hemodialysis yesterday, was given IV fluid bolus, also giving increased dose of prednisone. He also received a dose of midodrine. Cardiology recommended to rule out acute bacteremia with ICD lead  infection. Will obtain blood cultures, ID consulted.  7/2.  Podiatry has evaluated left ankle wound, no active infection.  Blood culture negative  #1.  Syncope secondary to ventricular tachycardia with chronic systolic congestive heart failure.  Ejection fraction 25 to 30%. No recurrence.  Discussed with Dr. Rockey Situ from cardiology, he is considering TEE for possible endocarditis as outpatient probably on Friday.   Blood culture is negative.    Currently no plan to replace the leads from AICD unless it is infected.  Patient also evaluated by infectious disease, no need for antibiotics except Augmentin for few days for skin infection.   Will arrange for ID follow-up as outpatient.    #2.  Hypotension. Blood pressure is much better today.  Prescribe midodrine for dialysis days.   3.  Right 1st and the 4th finger necrosis. Could be secondary to ischemia from right AV fistula.  Another possibility is  thrombosis.  Patient is evaluated by vascular surgeon, has scheduled for patient come back on Tuesday for ligation of AV fistula.  4.  Left ankle wound. Does not look infected.    Evaluated by podiatry.  Followed by wound care as outpatient.  5.  Elevated troponin. Likely secondary to hypotension or ventricular tachycardia.  No plan for work-up per cardiology.  6.  End-stage renal disease on dialysis.   7.  Paroxysmal atrial fibrillation. Follow by cardiology.  8.  Type 2 diabetes. Continue current regimen.  9.  Anemia of chronic disease. Stable.  Consults: Nephrology, cardiology, vascular surgeon, infectious disease  Significant Diagnostic Studies:  CT ANGIOGRAPHY HEAD AND NECK  TECHNIQUE: Multidetector CT imaging of the head and neck was performed using the standard protocol during bolus administration of intravenous contrast. Multiplanar CT image reconstructions and MIPs were  obtained to evaluate the vascular anatomy. Carotid stenosis measurements (when applicable) are  obtained utilizing NASCET criteria, using the distal internal carotid diameter as the denominator.  CONTRAST:  70mL OMNIPAQUE IOHEXOL 350 MG/ML SOLN  COMPARISON:  Head CT 10/23/2019, report from CT angiogram neck 07/24/2008 (images unavailable), report from carotid artery duplex 09/07/2018.  FINDINGS: CT HEAD FINDINGS  Brain:  Cerebral volume is normal for age.  There is no acute intracranial hemorrhage.  No demarcated cortical infarct is identified.  No extra-axial fluid collection.  No evidence of intracranial mass.  No midline shift.  Vascular: Reported below.  Skull: Normal. Negative for fracture or focal lesion.  Sinuses: No significant paranasal sinus disease or mastoid effusion at the imaged levels.  Orbits: No acute abnormality.  Review of the MIP images confirms the above findings  CTA NECK FINDINGS  Aortic arch: Common origin of the innominate and left common carotid arteries. Atherosclerotic plaque within the visualized aortic arch and proximal major branch vessels of the neck. No hemodynamically significant innominate or proximal subclavian artery stenosis.  Right carotid system: CCA and ICA patent within the neck. Mild scattered mixed plaque within the CCA. There is more prominent calcified plaque within the carotid bifurcation and proximal ICA. There is stenosis of the proximal ICA estimated at up to 50-60%, although this may be overestimated on the current exam due to blooming artifact from irregular calcified plaque.  Left carotid system: The CCA and ICA are patent. There is a stent extending from the distal CCA into the proximal ICA. There is no hemodynamically significant stenosis within the CCA or cervical ICA proximal or distal to the stent. The stent is patent, although artifact limits evaluation for intra-stent stenosis.  Vertebral arteries: The vertebral arteries are codominant. There is fairly prominent multifocal  mixed plaque within the V1 and proximal V2 right vertebral artery with sites of moderate/severe stenosis. There is also calcified plaque within the distal cervical right vertebral artery at the C1-C2 level with up to mild/moderate stenosis at this site. The left vertebral artery is patent throughout the neck with high-grade stenosis at its origin.  Skeleton: No acute bony abnormality or aggressive osseous lesion. Cervical spondylosis. Most notably at C3-C4, there is advanced disc space narrowing and a disc bulge with osteophyte ridge contributes to suspected at least moderate spinal canal stenosis.  Other neck: No neck mass or cervical lymphadenopathy. Thyroid unremarkable.  Upper chest: No consolidation within the imaged lung apices. Prior median sternotomy. Partially imaged left chest multi lead AICD device. Partially imaged right IJ approach central venous catheter.  Review of the MIP images confirms the above findings  CTA HEAD FINDINGS  Anterior circulation:  The intracranial internal carotid arteries are patent. Calcified plaque within both vessels. Sites of up to moderate stenosis within the paraclinoid right ICA. Sites of moderate stenosis within the cavernous left ICA.  The M1 middle cerebral arteries are patent without significant stenosis. No M2 proximal branch occlusion or high-grade proximal arterial stenosis is identified.  The anterior cerebral arteries are patent without significant proximal stenosis.  No intracranial aneurysm is identified.  Posterior circulation:  The intracranial vertebral arteries are patent. Calcified plaque results in severe stenosis within the right V4 segment. The basilar artery is patent without significant stenosis. The posterior cerebral arteries are patent bilaterally. Moderate/severe stenosis within a P3 left PCA branch (series 13, image 32). Posterior communicating arteries are hypoplastic or absent  bilaterally.  Venous sinuses: Within limitations of contrast timing, no convincing thrombus.  Anatomic  variants: As described.  Review of the MIP images confirms the above findings  IMPRESSION: CT head:  Unremarkable non-contrast CT appearance of the brain. No evidence of acute intracranial abnormality.  CTA neck:  1. The right common and internal carotid arteries are patent within the neck. There is somewhat prominent calcified plaque within the carotid bifurcation and proximal ICA. Apparent stenosis of the proximal right ICA of up to 50-60%, although this stenosis may be overestimated due to blooming artifact from irregular calcified plaque at this site. Carotid artery duplex may be obtained for further evaluation, as clinically warranted. 2. The left common and internal carotids are patent within the neck, as is the left carotid stent. Artifact limits evaluation for intra-stent stenosis. 3. The right vertebral artery is patent. here are sites of moderate/severe atherosclerotic narrowing within the V1 and proximal V2 segments. Additionally, there is mild/moderate atherosclerotic narrowing of this vessel at the C1-C2 level. 4. The left vertebral artery is patent. Severe atherosclerotic narrowing at the origin of this vessel. 5. Cervical spondylosis as described and greatest at C3-C4 where there is suspected at least moderate spinal canal stenosis.  CTA head:  1. No intracranial large vessel occlusion. 2. Intracranial atherosclerotic disease with multifocal stenoses, most notably as follows. 3. Sites of up to moderate stenosis within the paraclinoid right ICA and cavernous left ICA. 4. High-grade stenosis within the V4 right vertebral artery. 5. Moderate/severe focal stenosis within a P3 left PCA branch vessel.   Electronically Signed   By: Kellie Simmering DO   On: 10/31/2019 20:28    Treatments: Clinical observation and work-up.  Discharge Exam: Blood  pressure (!) 129/46, pulse 78, temperature 98.4 F (36.9 C), temperature source Oral, resp. rate 17, height 5\' 9"  (1.753 m), weight 84.8 kg, SpO2 94 %. General appearance: alert and cooperative Resp: clear to auscultation bilaterally Cardio: Irregular and in no murmurs. GI: soft, non-tender; bowel sounds normal; no masses,  no organomegaly Extremities: R BKA, left ankle chronic ulcer Right thumb and ring finger necrosis. Disposition: Discharge disposition: 01-Home or Self Care       Discharge Instructions    AMB referral to wound care center   Complete by: As directed    Diet - low sodium heart healthy   Complete by: As directed    Discharge wound care:   Complete by: As directed    Dressing change daily   Increase activity slowly   Complete by: As directed      Allergies as of 11/04/2019      Reactions   Other Other (See Comments)   Cardiac Problems. Pt states he tolerates Toradol. Due to kidney and heart problems per pt   Ibuprofen Other (See Comments)   Heart problems   Baclofen Other (See Comments)   Metformin Diarrhea   Nsaids    Due to kidney and heart problems per pt      Medication List    STOP taking these medications   doxycycline 100 MG capsule Commonly known as: VIBRAMYCIN     TAKE these medications   allopurinol 100 MG tablet Commonly known as: ZYLOPRIM Take 1 tablet (100 mg total) by mouth daily.   amiodarone 100 MG tablet Commonly known as: PACERONE Take 100 mg by mouth daily.   amoxicillin-clavulanate 500-125 MG tablet Commonly known as: AUGMENTIN Take 1 tablet (500 mg total) by mouth daily for 5 days.   atorvastatin 40 MG tablet Commonly known as: LIPITOR Take 1 tablet (40 mg total) by mouth daily.  Start taking on: November 05, 2019   calcium acetate 667 MG capsule Commonly known as: PHOSLO Take 2 capsules (1,334 mg total) by mouth 3 (three) times daily with meals.   clopidogrel 75 MG tablet Commonly known as: PLAVIX Take 1 tablet (75  mg total) by mouth daily with breakfast.   HYDROcodone-acetaminophen 5-325 MG tablet Commonly known as: Norco Take 1 tablet by mouth every 6 (six) hours as needed for moderate pain. What changed:   how much to take  how to take this  when to take this  additional instructions   midodrine 10 MG tablet Commonly known as: PROAMATINE Take 1 tablet (10 mg total) by mouth every Monday, Wednesday, and Friday with hemodialysis. Start taking on: November 06, 2019   predniSONE 10 MG tablet Commonly known as: DELTASONE Take 5 mg by mouth daily. TAKE 1 TABLET BY MOUTH EVERY DAY            Discharge Care Instructions  (From admission, onward)         Start     Ordered   11/04/19 0000  Discharge wound care:       Comments: Dressing change daily   11/04/19 1408          Follow-up Information    Hande, Vishwanath, MD Follow up in 1 week(s).   Specialty: Internal Medicine Contact information: [address] [place] [address] [phone]        Minna Merritts, MD Follow up in 1 week(s).   Specialty: Cardiology Contact information: [place] [address] [phone]        Tsosie Billing, MD Follow up in 1 week(s).   Specialty: Infectious Diseases Contact information: [place] [address] [phone]              35 minutes  Signed: Sharen Hones 11/04/2019, 2:16 PM

## 2019-11-04 NOTE — Consult Note (Signed)
[facility] [place]  Patient name: Paul [name]. MRN: [id number] DOB: 10-29-1965 Sex: male   REQUESTING PROVIDER:    Dr. Roosevelt Locks   REASON FOR CONSULT:    Ischemic right fingers  HISTORY OF PRESENT ILLNESS:   Paul [name]. is a 54 y.o. male, who was admitted on 629 for syncope.  He has a history of coronary artery disease, status post CABG in 2010.  He suffers from ischemic cardiomyopathy with an ejection fraction in the 25 to 30% range.  He has an AICD in place.  He has paroxysmal atrial fibrillation but is not on anticoagulation.  He has a history of stroke.  He is status post left carotid stenting.  He has undergone a right below-knee amputation for peripheral vascular disease.  He is on dialysis Monday Wednesday Friday through a right sided catheter.  I was asked to evaluate the patient because of pain and ischemic changes to his right fingers.  The patient states that this began approximately 2 to 3 weeks ago.  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  He also has a recent traumatic injury to the fingers.  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  He is currently dialyzing through his catheter which has low flow rates.  PAST MEDICAL HISTORY    Past Medical History:  Diagnosis Date  . AICD (automatic cardioverter/defibrillator) present 2011  . Anemia   . Cardiac defibrillator in place    a. Biotronik LUmax 540 DRT, (ser # [id number]).  . Carotid arterial disease ([place])    a. s/p prior LICA stenting;  b. 0/1751 Carotid U/S: 40-59% bilat ICA stenosis. Patent LICA stent.  . CHF (congestive heart failure) ([place])   . CKD (chronic kidney disease), stage III   . Coronary artery disease    a. 2010 s/p CABG x 3.  . DDD (degenerative disc  disease), lumbosacral    L5-S1  . Diabetes ([place])    Lantus at bedtime  . Dialysis patient [facility])    M,W,F  . Employs prosthetic leg    Right BKA  . Gangrene of toe of right foot ([place])   . Gout of left hand 10/06/2016  . HFrEF (heart failure with reduced ejection fraction) ([place])    a. 07/2016 Echo: EF 25-30%, diff HK, mild MR, mildly dil LA, mod reduced RV fxn, PASP 57mmHg.  Marland Kitchen Hypertension    takes Coreg daily  . Ischemic cardiomyopathy    a. 07/2016 Echo: EF 25-30%, diff HK.  . Myocardial infarction ([place]) 2009  . Peripheral vascular disease ([place])   . Sleep apnea    sleep study yr ago-unable to afford cpap  . Stroke [facility]) 09   no weakness     FAMILY HISTORY   Family History  Problem Relation Age of Onset  . Hypertension Other   . Diabetes Other   . Diabetes Father   . Hyperlipidemia Father   . Hypertension Father   . Hyperlipidemia Sister   . Hypertension Sister   . Diabetes Sister   . Diabetes Brother   . Hypertension Brother   . Hyperlipidemia Brother     SOCIAL HISTORY:   Social History   Socioeconomic History  . Marital status: Single    Spouse name: Not on file  . Number  of children: Not on file  . Years of education: Not on file  . Highest education level: Not on file  Occupational History  . Not on file  Tobacco Use  . Smoking status: Never Smoker  . Smokeless tobacco: Never Used  Vaping Use  . Vaping Use: Never used  Substance and Sexual Activity  . Alcohol use: No  . Drug use: Yes    Types: Marijuana  . Sexual activity: Not on file  Other Topics Concern  . Not on file  Social History Narrative   Lives at home alone   Social Determinants of Health   Financial Resource Strain:   . Difficulty of Paying Living Expenses:   Food Insecurity: Food Insecurity Present  . Worried About Charity fundraiser in the Last Year: Sometimes true  . Ran Out of Food in the Last Year: Never true  Transportation Needs: Unmet Transportation Needs  . Lack of  Transportation (Medical): Yes  . Lack of Transportation (Non-Medical): Yes  Physical Activity:   . Days of Exercise per Week:   . Minutes of Exercise per Session:   Stress:   . Feeling of Stress :   Social Connections:   . Frequency of Communication with Friends and Family:   . Frequency of Social Gatherings with Friends and Family:   . Attends Religious Services:   . Active Member of Clubs or Organizations:   . Attends Archivist Meetings:   Marland Kitchen Marital Status:   Intimate Partner Violence:   . Fear of Current or Ex-Partner:   . Emotionally Abused:   Marland Kitchen Physically Abused:   . Sexually Abused:     ALLERGIES:    Allergies  Allergen Reactions  . Other Other (See Comments)    Cardiac Problems. Pt states he tolerates Toradol. Due to kidney and heart problems per pt  . Ibuprofen Other (See Comments)    Heart problems  . Baclofen Other (See Comments)  . Metformin Diarrhea  . Nsaids     Due to kidney and heart problems per pt    CURRENT MEDICATIONS:    Current Facility-Administered Medications  Medication Dose Route Frequency Provider Last Rate Last Admin  . 0.9 %  sodium chloride infusion   Intravenous PRN Lenore Cordia, MD   Stopped at 11/01/19 0234  . acetaminophen (TYLENOL) tablet 650 mg  650 mg Oral Q6H PRN Lenore Cordia, MD   650 mg at 10/31/19 2249   Or  . acetaminophen (TYLENOL) suppository 650 mg  650 mg Rectal Q6H PRN Lenore Cordia, MD      . amiodarone (PACERONE) tablet 200 mg  200 mg Oral Daily Kate Sable, MD   200 mg at 11/04/19 1020  . amoxicillin-clavulanate (AUGMENTIN) 500-125 MG per tablet 500 mg  500 mg Oral Daily Ravishankar, Joellyn Quails, MD   500 mg at 11/04/19 1020  . atorvastatin (LIPITOR) tablet 40 mg  40 mg Oral Daily Kate Sable, MD   40 mg at 11/04/19 1020  . calcium acetate (PHOSLO) capsule 1,334 mg  1,334 mg Oral TID WC Kolluru, Sarath, MD   1,334 mg at 11/04/19 1410  . Chlorhexidine Gluconate Cloth 2 % PADS 6 each  6  each Topical Q0600 Lavonia Dana, MD   6 each at 11/04/19 [phone]  . clopidogrel (PLAVIX) tablet 75 mg  75 mg Oral Q breakfast Lenore Cordia, MD   75 mg at 11/04/19 0834  . collagenase (SANTYL) ointment   Topical Once per day on  Tue Lauris Chroman, Danford Bad, MD      . heparin injection 5,000 Units  5,000 Units Subcutaneous Q8H Lenore Cordia, MD   5,000 Units at 11/03/19 1328  . HYDROcodone-acetaminophen (NORCO/VICODIN) 5-325 MG per tablet 1 tablet  1 tablet Oral Q6H PRN Sharen Hones, MD   1 tablet at 11/04/19 1410  . insulin aspart (novoLOG) injection 0-6 Units  0-6 Units Subcutaneous TID WC Lenore Cordia, MD   1 Units at 11/04/19 1409  . midodrine (PROAMATINE) tablet 10 mg  10 mg Oral Q M,W,F-HD Kate Sable, MD   10 mg at 11/01/19 1620  . predniSONE (DELTASONE) tablet 5 mg  5 mg Oral Q breakfast Sharen Hones, MD   5 mg at 11/04/19 0834  . sodium chloride flush (NS) 0.9 % injection 3 mL  3 mL Intravenous Q12H Lenore Cordia, MD   3 mL at 11/04/19 1021    REVIEW OF SYSTEMS:   [X]  denotes positive finding, [ ]  denotes negative finding Cardiac  Comments:  Chest pain or chest pressure:    Shortness of breath upon exertion:    Short of breath when lying flat:    Irregular heart rhythm:        Vascular    Pain in calf, thigh, or hip brought on by ambulation:    Pain in feet at night that wakes you up from your sleep:     Blood clot in your veins:    Leg swelling:         Pulmonary    Oxygen at home:    Productive cough:     Wheezing:         Neurologic    Sudden weakness in arms or legs:     Sudden numbness in arms or legs:     Sudden onset of difficulty speaking or slurred speech:    Temporary loss of vision in one eye:     Problems with dizziness:         Gastrointestinal    Blood in stool:      Vomited blood:         Genitourinary    Burning when urinating:     Blood in urine:        Psychiatric    Major depression:         Hematologic    Bleeding problems:     Problems with blood clotting too easily:        Skin    Rashes or ulcers:        Constitutional    Fever or chills:     PHYSICAL EXAM:   Vitals:   11/03/19 1935 11/04/19 0409 11/04/19 0738 11/04/19 1217  BP: (!) 117/56 (!) 152/49 (!) 144/59 (!) 129/46  Pulse: 76 78 76 78  Resp: 20 20 18 17   Temp: 98 F (36.7 C) 99 F (37.2 C) 97.8 F (36.6 C) 98.4 F (36.9 C)  TempSrc: Oral Oral Oral Oral  SpO2: 98% 96% 97% 94%  Weight:  84.8 kg    Height:        GENERAL: The patient is a well-nourished male, in no acute distress. The vital signs are documented above. CARDIAC: There is a regular rate and rhythm.  VASCULAR: There is an audible thrill in his right arm graft.  I cannot palpate radial or ulnar Doppler signals on the right PULMONARY: Nonlabored respirations ABDOMEN: Soft and non-tender with normal pitched bowel sounds.  MUSCULOSKELETAL: Right leg amputation NEUROLOGIC: No  focal weakness or paresthesias are detected. SKIN: See photo below PSYCHIATRIC: The patient has a normal affect.      STUDIES:     ASSESSMENT and PLAN   Ischemic changes to right middle finger and thumb: These appear to be chronic as they have a dark dry eschar.  I think he will have difficulty healing these because of the poor circulation in his hand which is augmented by the dialysis graft in his arm.  I discussed that he will likely need ligation of his dialysis graft to maximize blood flow to his hand.  He will also need angiography evaluate blood flow to his hand.  He is certainly at risk for needing amputation of his fingers.  He would like to go home.  I think this is reasonable and we will have him come in Tuesday morning for angiography.   Leia Alf, MD, FACS [facility] [phone] Pager [phone]

## 2019-11-04 NOTE — Plan of Care (Signed)
  Problem: Education: Goal: Knowledge of General Education information will improve Description: Including pain rating scale, medication(s)/side effects and non-pharmacologic comfort measures Outcome: Progressing   Problem: Clinical Measurements: Goal: Respiratory complications will improve Outcome: Progressing Goal: Cardiovascular complication will be avoided Outcome: Progressing   Problem: Pain Managment: Goal: General experience of comfort will improve Outcome: Not Progressing Note: Patient has persistent pain in his right arm.

## 2019-11-04 NOTE — Progress Notes (Signed)
[facility]  ROUNDING NOTE   Subjective:   Patient is doing fair today Denies any acute symptoms No nausea or vomiting No shortness of breath States he has been experiencing symptoms of dizziness, head spinning and vomiting at home after dialysis for the past 2 to 3 weeks.  This past Tuesday, he felt symptoms were worse therefore he called 911.  He states he woke up soaking wet.  He also states that EMS commented that his heart rate was in the 160s range.  Objective:  Vital signs in last 24 hours:  Temp:  [97.3 F (36.3 C)-99 F (37.2 C)] 98.4 F (36.9 C) (07/03 1217) Pulse Rate:  [76-86] 78 (07/03 1217) Resp:  [17-20] 17 (07/03 1217) BP: (97-152)/(46-74) 129/46 (07/03 1217) SpO2:  [94 %-98 %] 94 % (07/03 1217) Weight:  [84.8 kg] 84.8 kg (07/03 0409)  Weight change: 0.227 kg Filed Weights   10/31/19 1700 11/03/19 0504 11/04/19 0409  Weight: 83.9 kg 84.6 kg 84.8 kg    Intake/Output: I/O last 3 completed shifts: In: -  Out: 899 [Other:899]   Intake/Output this shift:  Total I/O In: 240 [P.O.:240] Out: -  Physical Exam: General:  No acute distress, laying in the bed  HEENT  anicteric, moist oral mucous membrane  Pulm/lungs  normal breathing effort, lungs are clear to auscultation  CVS/Heart  regular rhythm, no rub or gallop  Abdomen:   Soft, nontender  Extremities:  No peripheral edema, right BKA, right hand third finger is gangrenous  Neurologic:  Alert, oriented, able to follow commands  Skin:  No acute rashes  Right IJ PermCath Right arm AV graft maturing     Basic Metabolic Panel: Recent Labs  Lab 10/31/19 1745 10/31/19 1745 11/01/19 0442 11/01/19 0442 11/02/19 0547 11/03/19 0437 11/04/19 0723  NA 136  --  134*  --  139 136 135  K 3.5  --  3.6  --  4.1 3.8 4.7  CL 99  --  99  --  100 97* 96*  CO2 21*  --  23  --  26 22 24   GLUCOSE 230*  --  159*  --  117* 134* 133*  BUN 48*  --  54*  --  43* 57* 46*  CREATININE 10.56*  --  11.28*  --   8.91* 10.65* 8.74*  CALCIUM 7.6*   < > 7.4*   < > 7.8* 8.2* 8.4*  MG 2.2  --   --   --  2.2  --   --    < > = values in this interval not displayed.    Liver Function Tests: Recent Labs  Lab 10/31/19 1745  AST 15  ALT 16  ALKPHOS 89  BILITOT 0.6  PROT 6.9  ALBUMIN 3.5   No results for input(s): LIPASE, AMYLASE in the last 168 hours. No results for input(s): AMMONIA in the last 168 hours.  CBC: Recent Labs  Lab 10/31/19 1745 11/01/19 0442 11/02/19 0547 11/03/19 0437 11/04/19 0723  WBC 13.7* 12.8* 15.2* 14.4* 14.5*  NEUTROABS 11.2*  --  12.5* 11.5* 11.8*  HGB 12.5* 12.2* 12.6* 13.2 13.0  HCT 38.6* 36.2* 38.0* 39.2 40.6  MCV 97.0 95.0 95.0 93.3 98.1  PLT 285 266 270 288 273    Cardiac Enzymes: No results for input(s): CKTOTAL, CKMB, CKMBINDEX, TROPONINI in the last 168 hours.  BNP: Invalid input(s): POCBNP  CBG: Recent Labs  Lab 11/03/19 1309 11/03/19 1643 11/03/19 2052 11/04/19 0738 11/04/19 1218  GLUCAP 119*  195* 208* 128* 192*    Microbiology: Results for orders placed or performed during the hospital encounter of 10/31/19  SARS Coronavirus 2 by RT PCR (hospital order, performed in [facility] hospital lab) Nasopharyngeal Nasopharyngeal Swab     Status: None   Collection Time: 10/31/19  7:28 PM   Specimen: Nasopharyngeal Swab  Result Value Ref Range Status   SARS Coronavirus 2 NEGATIVE NEGATIVE Final    Comment: (NOTE) SARS-CoV-2 target nucleic acids are NOT DETECTED.  The SARS-CoV-2 RNA is generally detectable in upper and lower respiratory specimens during the acute phase of infection. The lowest concentration of SARS-CoV-2 viral copies this assay can detect is 250 copies / mL. A negative result does not preclude SARS-CoV-2 infection and should not be used as the sole basis for treatment or other patient management decisions.  A negative result may occur with improper specimen collection / handling, submission of specimen other than  nasopharyngeal swab, presence of viral mutation(s) within the areas targeted by this assay, and inadequate number of viral copies (<250 copies / mL). A negative result must be combined with clinical observations, patient history, and epidemiological information.  Fact Sheet for Patients:   StrictlyIdeas.no  Fact Sheet for Healthcare Providers: BankingDealers.co.za  This test is not yet approved or  cleared by the [place] FDA and has been authorized for detection and/or diagnosis of SARS-CoV-2 by FDA under an Emergency Use Authorization (EUA).  This EUA will remain in effect (meaning this test can be used) for the duration of the COVID-19 declaration under Section 564(b)(1) of the Act, 21 U.S.C. section 360bbb-3(b)(1), unless the authorization is terminated or revoked sooner.  Performed at [facility], [address] [place], [place] [address]   CULTURE, BLOOD (ROUTINE X 2) w Reflex to ID Panel     Status: None (Preliminary result)   Collection Time: 11/02/19 10:14 AM   Specimen: BLOOD  Result Value Ref Range Status   Specimen Description BLOOD LEFT ANTECUBITAL  Final   Special Requests   Final    BOTTLES DRAWN AEROBIC AND ANAEROBIC Blood Culture adequate volume   Culture   Final    NO GROWTH 2 DAYS Performed at [facility], [address] [place], [place] [address]    Report Status PENDING  Incomplete  CULTURE, BLOOD (ROUTINE X 2) w Reflex to ID Panel     Status: None (Preliminary result)   Collection Time: 11/02/19  2:42 PM   Specimen: BLOOD  Result Value Ref Range Status   Specimen Description BLOOD BLOOD LEFT ARM  Final   Special Requests   Final    BOTTLES DRAWN AEROBIC AND ANAEROBIC Blood Culture adequate volume   Culture   Final    NO GROWTH 2 DAYS Performed at [facility], [address], [place], [place] [address]    Report Status PENDING  Incomplete    Coagulation  Studies: No results for input(s): LABPROT, INR in the last 72 hours.  Urinalysis: No results for input(s): COLORURINE, LABSPEC, PHURINE, GLUCOSEU, HGBUR, BILIRUBINUR, KETONESUR, PROTEINUR, UROBILINOGEN, NITRITE, LEUKOCYTESUR in the last 72 hours.  Invalid input(s): APPERANCEUR    Imaging: No results found.   Medications:    sodium chloride Stopped (11/01/19 0234)    amiodarone  200 mg Oral Daily   amoxicillin-clavulanate  500 mg Oral Daily   atorvastatin  40 mg Oral Daily   calcium acetate  1,334 mg Oral TID WC   Chlorhexidine Gluconate Cloth  6 each Topical Q0600  clopidogrel  75 mg Oral Q breakfast   collagenase   Topical Once per day on Tue Thu   heparin  5,000 Units Subcutaneous Q8H   insulin aspart  0-6 Units Subcutaneous TID WC   midodrine  10 mg Oral Q M,W,F-HD   predniSONE  5 mg Oral Q breakfast   sodium chloride flush  3 mL Intravenous Q12H   sodium chloride, acetaminophen **OR** acetaminophen, HYDROcodone-acetaminophen  Assessment/ Plan:  Mr. Dervin Vore. is a 54 y.o. black male with end stage renal disease, type 2 diabetes mellitus insulin dependent, diabetic neuropathy, hypertension, coronary artery disease status post CABG, systolic and diastolic congestive heart failure, status post AICD, CVA, peripheral vascular disease status post right BKA who is admitted to [facility] on 10/31/2019 for Syncope [R55] Ventricular tachycardia ([place]) [I47.2] NSTEMI (non-ST elevated myocardial infarction) ([place]) [I21.4] ESRD on hemodialysis ([place]) [N18.6, Z99.2] Syncope, unspecified syncope type [R55] Type 2 diabetes mellitus without complication, without long-term current use of insulin ([place]) [E11.9] V tach ([place]) [I47.2]  [facility] MWF RIJ permcath 86kg  1. End stage renal disease on hemodialysis: emergent hemodialysis treatment on admission.  Next routine hemodialysis planned for Monday.  Will monitor his dry weight closely as  outpatient Discussed with patient not to attempt excessive volume challenge as outpatient.  2. Hypotension: holding all anti-hypertensive agents. Orthostatic yesterday. Suspect a component of autonomic neuropathy versus excessive volume removal during dialysis.  Patient has been requesting volume challenge thinking that he has excessive edema in his stump. Appreciate cardiology input. Blood cultures with no growth. Concern for underlying ICD and endocarditis infection.  TTE possibly next week on Friday - midodrine before dialysis.   3. Anemia with chronic kidney disease:  Lab Results  Component Value Date   HGB 13.0 11/04/2019     4. Secondary Hyperparathyroidism:  - calcium acetate with meals.  Lab Results  Component Value Date   PTH 66 (H) 07/31/2018   CALCIUM 8.4 (L) 11/04/2019   PHOS 4.1 01/03/2019   5. Diabetes with CKD Lab Results  Component Value Date   HGBA1C 7.6 (H) 11/01/2019      LOS: 3 Krupa Stege 7/3/20211:41 PM

## 2019-11-06 DIAGNOSIS — Z992 Dependence on renal dialysis: Secondary | ICD-10-CM | POA: Diagnosis not present

## 2019-11-06 DIAGNOSIS — N186 End stage renal disease: Secondary | ICD-10-CM | POA: Diagnosis not present

## 2019-11-07 ENCOUNTER — Telehealth (INDEPENDENT_AMBULATORY_CARE_PROVIDER_SITE_OTHER): Payer: Self-pay | Admitting: Vascular Surgery

## 2019-11-07 ENCOUNTER — Telehealth: Payer: Self-pay | Admitting: *Deleted

## 2019-11-07 LAB — CULTURE, BLOOD (ROUTINE X 2)
Culture: NO GROWTH
Culture: NO GROWTH
Special Requests: ADEQUATE
Special Requests: ADEQUATE

## 2019-11-07 NOTE — Telephone Encounter (Signed)
Called stating he would like to come in to be seen. He was just discharged over the weekend and was told to call us this morning. Patient has 2 fingers that are purple (right thumb and right ring finger). He has an upcomming appt on the 15th appt line reads: DR. Candiss Norse, PERMISSION TO CANNULATE, HDA SEE FB/JD". Patient was last seen 07-20-19 with HDA studies, 09-14-19 had PERIPHERAL VASCULAR THROMBECTOMY (JD). Please advise

## 2019-11-07 NOTE — Telephone Encounter (Signed)
Patient transportation [facility] (Michelle)was contacted to try to schedule an sooner appointment but 11/08/19 was booked for [facility] but the patient is schedule to come in 11/10/19. Patient has been made aware that appointment has been schedule.

## 2019-11-07 NOTE — Telephone Encounter (Signed)
Can we move up patient appointment sooner with steal study per Paul Ditch NP and patient has been made aware that someone will be calling to schedule appointment.

## 2019-11-07 NOTE — Telephone Encounter (Signed)
Recommend TEE be done at the patient's earliest convenience, when transportation and person to watch Mr. Bitting at home can be arranged.  TEE can be done by any provider.

## 2019-11-07 NOTE — Anesthesia Preprocedure Evaluation (Addendum)
Anesthesia Evaluation  Patient identified by MRN, date of birth, ID band Patient awake    Reviewed: Allergy & Precautions, NPO status , Patient's Chart, lab work & pertinent test results, reviewed documented beta blocker date and time   History of Anesthesia Complications Negative for: history of anesthetic complications  Airway Mallampati: III  TM Distance: >3 FB Neck ROM: Full    Dental  (+) Upper Dentures, Lower Dentures   Pulmonary sleep apnea ,    breath sounds clear to auscultation       Cardiovascular hypertension, (-) angina+ CAD, + Past MI (2009), + CABG (S/p CABG x3 2010, on Plavix), + Peripheral Vascular Disease (Carotid stentosis s/p L ICA stent) and +CHF  (-) DOE + dysrhythmias Atrial Fibrillation and Ventricular Tachycardia + Cardiac Defibrillator (2011 Biotronik)  Rhythm:Regular Rate:Normal   TTE (10/2019): LVEF 25-30%. LV has severely decreased function w/ global hypokinesis Grade I diastolic dysfunction RV systolic function mod reduced   Neuro/Psych CVA    GI/Hepatic neg GERD  ,  Endo/Other  diabetes  Renal/GU ESRF and DialysisRenal disease ([facility] MWF)     Musculoskeletal  (+) Arthritis ,   Abdominal   Peds  Hematology  (+) anemia ,   Anesthesia Other Findings Gout   Reproductive/Obstetrics                            Anesthesia Physical Anesthesia Plan  ASA: III  Anesthesia Plan: MAC   Post-op Pain Management:    Induction: Intravenous  PONV Risk Score and Plan: 1 and TIVA, Midazolam and Treatment may vary due to age or medical condition  Airway Management Planned: Nasal Cannula  Additional Equipment:   Intra-op Plan:   Post-operative Plan:   Informed Consent: I have reviewed the patients History and Physical, chart, labs and discussed the procedure including the risks, benefits and alternatives for the proposed anesthesia with the patient or authorized  representative who has indicated his/her understanding and acceptance.       Plan Discussed with: CRNA and Anesthesiologist  Anesthesia Plan Comments:         Anesthesia Quick Evaluation

## 2019-11-07 NOTE — Telephone Encounter (Signed)
Called patient to schedule TEE. Patient has dialysis and is usually home by 10:45 am.  He uses [facility] for transportation and they require more than 3 days notification unless the provider's office is the one who calls.  Patient also will be taking [facility] home because he has no other mode of transportation. Someone will be home when he gets home to be with him for 24 hours but no one can accompany him on the ride home.   Called [facility] and spoke with representative.  States [place] [facility] have just scheduled him for an appointment that afternoon as well. She was told that appointment was very important.  Not sure if TEE should be rescheduled. Routing to Dr Saunders Revel and Rockey Situ for advice.

## 2019-11-07 NOTE — Telephone Encounter (Signed)
-----   Message from Nelva Bush, MD sent at 11/04/2019 10:09 AM EDT ----- Regarding: Outpatient TEE Hello,  Could you help arrange for Mr. Mcdiarmid to have a TEE done with Dr. Rockey Situ on Friday (7/9) to rule out endocarditis, ideally around noon or later as the patient has hemodialysis until 10:30?  Thanks.  Gerald Stabs

## 2019-11-07 NOTE — Telephone Encounter (Signed)
Can we call to clarify if the patient would like to be seen in office or if he's calling about the procedure that was discussed while he was admitted.

## 2019-11-08 ENCOUNTER — Ambulatory Visit
Admission: RE | Admit: 2019-11-08 | Discharge: 2019-11-08 | Disposition: A | Discharge status: Home / Self Care | Payer: Medicare HMO | Source: Home / Self Care | Attending: Ophthalmology | Admitting: Ophthalmology

## 2019-11-08 ENCOUNTER — Ambulatory Visit: Payer: Medicare HMO | Admitting: Anesthesiology

## 2019-11-08 ENCOUNTER — Encounter
Admission: RE | Disposition: A | Discharge status: Home / Self Care | Payer: Self-pay | Source: Home / Self Care | Attending: Ophthalmology

## 2019-11-08 ENCOUNTER — Ambulatory Visit (INDEPENDENT_AMBULATORY_CARE_PROVIDER_SITE_OTHER): Payer: Medicare HMO | Admitting: Nurse Practitioner

## 2019-11-08 ENCOUNTER — Other Ambulatory Visit (INDEPENDENT_AMBULATORY_CARE_PROVIDER_SITE_OTHER): Payer: Medicare HMO

## 2019-11-08 ENCOUNTER — Encounter: Payer: Self-pay | Admitting: Ophthalmology

## 2019-11-08 ENCOUNTER — Other Ambulatory Visit: Payer: Self-pay

## 2019-11-08 DIAGNOSIS — I255 Ischemic cardiomyopathy: Secondary | ICD-10-CM | POA: Diagnosis not present

## 2019-11-08 DIAGNOSIS — H25811 Combined forms of age-related cataract, right eye: Secondary | ICD-10-CM | POA: Diagnosis not present

## 2019-11-08 DIAGNOSIS — E1151 Type 2 diabetes mellitus with diabetic peripheral angiopathy without gangrene: Secondary | ICD-10-CM | POA: Diagnosis not present

## 2019-11-08 DIAGNOSIS — Z89511 Acquired absence of right leg below knee: Secondary | ICD-10-CM | POA: Diagnosis not present

## 2019-11-08 DIAGNOSIS — Z4781 Encounter for orthopedic aftercare following surgical amputation: Secondary | ICD-10-CM | POA: Diagnosis not present

## 2019-11-08 DIAGNOSIS — S80922D Unspecified superficial injury of left lower leg, subsequent encounter: Secondary | ICD-10-CM | POA: Diagnosis not present

## 2019-11-08 DIAGNOSIS — I132 Hypertensive heart and chronic kidney disease with heart failure and with stage 5 chronic kidney disease, or end stage renal disease: Secondary | ICD-10-CM | POA: Diagnosis not present

## 2019-11-08 DIAGNOSIS — H2589 Other age-related cataract: Secondary | ICD-10-CM | POA: Diagnosis not present

## 2019-11-08 DIAGNOSIS — I2510 Atherosclerotic heart disease of native coronary artery without angina pectoris: Secondary | ICD-10-CM | POA: Diagnosis not present

## 2019-11-08 HISTORY — DX: Dependence on renal dialysis: Z99.2

## 2019-11-08 HISTORY — DX: Presence of artificial limb (complete) (partial), unspecified: Z97.10

## 2019-11-08 HISTORY — PX: CATARACT EXTRACTION W/PHACO: SHX586

## 2019-11-08 LAB — GLUCOSE, CAPILLARY: Glucose-Capillary: 98 mg/dL (ref 70–99)

## 2019-11-08 SURGERY — PHACOEMULSIFICATION, CATARACT, WITH IOL INSERTION
Anesthesia: Monitor Anesthesia Care | Site: Eye | Laterality: Right

## 2019-11-08 MED ORDER — NA HYALUR & NA CHOND-NA HYALUR 0.4-0.35 ML IO KIT
PACK | INTRAOCULAR | Status: DC | PRN
  Administered 2019-11-08: 1 mL via INTRAOCULAR

## 2019-11-08 MED ORDER — ARMC OPHTHALMIC DILATING DROPS
1.0000 "application " | OPHTHALMIC | Status: DC | PRN
  Administered 2019-11-08 (×3): 1 via OPHTHALMIC

## 2019-11-08 MED ORDER — MIDAZOLAM HCL 2 MG/2ML IJ SOLN
INTRAMUSCULAR | Status: DC | PRN
  Administered 2019-11-08: 2 mg via INTRAVENOUS

## 2019-11-08 MED ORDER — ONDANSETRON HCL 4 MG/2ML IJ SOLN: 4.0000 mg | Freq: Once | INTRAMUSCULAR | Status: DC | PRN

## 2019-11-08 MED ORDER — BRIMONIDINE TARTRATE-TIMOLOL 0.2-0.5 % OP SOLN
OPHTHALMIC | Status: DC | PRN
  Administered 2019-11-08: 1 [drp] via OPHTHALMIC

## 2019-11-08 MED ORDER — CEFUROXIME OPHTHALMIC INJECTION 1 MG/0.1 ML
INJECTION | OPHTHALMIC | Status: DC | PRN
  Administered 2019-11-08: 0.1 mL via INTRACAMERAL

## 2019-11-08 MED ORDER — LIDOCAINE HCL (PF) 2 % IJ SOLN
INTRAOCULAR | Status: DC | PRN
  Administered 2019-11-08: 1 mL

## 2019-11-08 MED ORDER — TETRACAINE HCL 0.5 % OP SOLN
1.0000 [drp] | OPHTHALMIC | Status: DC | PRN
  Administered 2019-11-08 (×3): 1 [drp] via OPHTHALMIC

## 2019-11-08 MED ORDER — LACTATED RINGERS IV SOLN: INTRAVENOUS | Status: DC

## 2019-11-08 MED ORDER — ACETAMINOPHEN 10 MG/ML IV SOLN: 1000.0000 mg | Freq: Once | INTRAVENOUS | Status: DC | PRN

## 2019-11-08 MED ORDER — SODIUM HYALURONATE 23 MG/ML IO SOLN
INTRAOCULAR | Status: DC | PRN
  Administered 2019-11-08: 0.6 mL via INTRAOCULAR

## 2019-11-08 MED ORDER — EPINEPHRINE PF 1 MG/ML IJ SOLN
INTRAOCULAR | Status: DC | PRN
  Administered 2019-11-08: 97 mL via OPHTHALMIC

## 2019-11-08 MED ORDER — FENTANYL CITRATE (PF) 100 MCG/2ML IJ SOLN
INTRAMUSCULAR | Status: DC | PRN
  Administered 2019-11-08 (×2): 50 ug via INTRAVENOUS

## 2019-11-08 MED ORDER — TRYPAN BLUE 0.06 % OP SOLN
OPHTHALMIC | Status: DC | PRN
  Administered 2019-11-08: 0.5 mL via INTRAOCULAR

## 2019-11-08 MED ORDER — MOXIFLOXACIN HCL 0.5 % OP SOLN
1.0000 [drp] | OPHTHALMIC | Status: DC | PRN
  Administered 2019-11-08 (×3): 1 [drp] via OPHTHALMIC

## 2019-11-08 MED ORDER — NEOMYCIN-POLYMYXIN-DEXAMETH 3.5-10000-0.1 OP OINT
TOPICAL_OINTMENT | OPHTHALMIC | Status: DC | PRN
  Administered 2019-11-08: 1 via OPHTHALMIC

## 2019-11-08 SURGICAL SUPPLY — 29 items
CANNULA ANT/CHMB 27G (MISCELLANEOUS) ×1 IMPLANT
CANNULA ANT/CHMB 27GA (MISCELLANEOUS) ×3 IMPLANT
GLOVE SURG LX 7.5 STRW (GLOVE) ×4
GLOVE SURG LX STRL 7.5 STRW (GLOVE) ×1 IMPLANT
GLOVE SURG TRIUMPH 8.0 PF LTX (GLOVE) ×3 IMPLANT
GOWN STRL REUS W/ TWL LRG LVL3 (GOWN DISPOSABLE) ×2 IMPLANT
GOWN STRL REUS W/TWL LRG LVL3 (GOWN DISPOSABLE) ×4
LENS IOL DIOP 25.0 (Intraocular Lens) ×3 IMPLANT
LENS IOL TECNIS MONO 25.0 (Intraocular Lens) IMPLANT
MARKER SKIN DUAL TIP RULER LAB (MISCELLANEOUS) ×3 IMPLANT
NDL CAPSULORHEX 25GA (NEEDLE) ×1 IMPLANT
NDL FILTER BLUNT 18X1 1/2 (NEEDLE) ×2 IMPLANT
NDL RETROBULBAR .5 NSTRL (NEEDLE) IMPLANT
NEEDLE CAPSULORHEX 25GA (NEEDLE) ×3 IMPLANT
NEEDLE FILTER BLUNT 18X 1/2SAF (NEEDLE) ×4
NEEDLE FILTER BLUNT 18X1 1/2 (NEEDLE) ×2 IMPLANT
PACK CATARACT BRASINGTON (MISCELLANEOUS) ×3 IMPLANT
PACK EYE AFTER SURG (MISCELLANEOUS) ×3 IMPLANT
PACK OPTHALMIC (MISCELLANEOUS) ×3 IMPLANT
RING MALYGIN 7.0 (MISCELLANEOUS) IMPLANT
SOLUTION OPHTHALMIC SALT (MISCELLANEOUS) ×3 IMPLANT
SUT ETHILON 10-0 CS-B-6CS-B-6 (SUTURE)
SUT VICRYL  9 0 (SUTURE)
SUT VICRYL 9 0 (SUTURE) IMPLANT
SUTURE EHLN 10-0 CS-B-6CS-B-6 (SUTURE) IMPLANT
SYR 3ML LL SCALE MARK (SYRINGE) ×6 IMPLANT
SYR TB 1ML LUER SLIP (SYRINGE) ×3 IMPLANT
WATER STERILE IRR 250ML POUR (IV SOLUTION) ×3 IMPLANT
WIPE NON LINTING 3.25X3.25 (MISCELLANEOUS) ×3 IMPLANT

## 2019-11-08 NOTE — Telephone Encounter (Addendum)
Spoke with patient about [facility] appt scheduled for Friday already and that it will not work to have TEE prior to. He is agreeable to have TEE after dialysis on Monday with Dr Fletcher Anon. He is aware I will set up [facility] (transportation) to get him from dialysis and bring him to [facility] to arrive around 11:30 am. [facility] will also take patient on Friday 11/10/19 by the [facility] for COVID test. Patient is aware.  He verbalized understanding of the below instructions as well.   You are scheduled for a TEE on __07/12/21_____ with Dr.__Arida___ Please arrive at the [facility] at _11:30__ a.m. on the day of your procedure.  DIET INSTRUCTIONS:  Nothing to eat or drink after midnight except your medications with a sip of water.     1) Labs: _____already done______  2) Medications:  YOU MAY TAKE ALL of your remaining medications with a small amount of water.  3) Must have a responsible person to drive you home.  4) Bring a current list of your medications and current insurance cards.   -------------------------------------------------------------------------------------------------------------------------- Called [employer] to make them aware of transportation issues. They are ok with patient leaving via [facility] post procedure because patient reassured me there would be someone at home when he arrived there. -------------------------------------------------------------------------------------------------------------------------- Called scheduling and have patient for TEE at 1300 with Dr Fletcher Anon on 11/13/19.  Called patient and he is aware of the above times and process and was appreciative.

## 2019-11-08 NOTE — Op Note (Signed)
OPERATIVE NOTE  Paul [name] [id number] 11/08/2019   PREOPERATIVE DIAGNOSIS:  H25.89 Cataract            Mature (Total) Cataract Right Eye H25.89   POSTOPERATIVE DIAGNOSIS:   H25.89 Cataract            Mature (Total) Cataract Right Eye H25.89   PROCEDURE:  Phacoemusification with posterior chamber intraocular lens placement of the right eye .  Vision Blue dye was used to stain the lens capsule.  LENS:   Implant Name Type Inv. Item Serial No. Manufacturer Lot No. LRB No. Used Action  LENS IOL DIOP 25.0 - [id number] Intraocular Lens LENS IOL DIOP 25.0 [id number] [employer]  Right 1 Implanted       ULTRASOUND TIME: 18 of 1 minutes 54 seconds, CDE 20.2  SURGEON:  Wyonia Hough, MD   ANESTHESIA:  Topical with tetracaine drops and 2% Xylocaine jelly, augmented with 1% preservative-free intracameral lidocaine.   COMPLICATIONS:  None.   DESCRIPTION OF PROCEDURE:  The patient was identified in the holding room and transported to the operating room and placed in the supine position under the operating microscope. Theright eye was identified as the operative eye and it was prepped and draped in the usual sterile ophthalmic fashion.  A 1 millimeter clear-corneal paracentesis was made at the 12:00 position.  0.5 ml of preservative-free 1% lidocaine was injected into the anterior chamber. The anterior chamber was filled with Healon 5 viscoelastic.  A 2.4 millimeter keratome was used to make a near-clear corneal incision at the 9:00 position.  The anterior chamber was filled with Healon 5 viscoelastic.  Vision Blue dye was then injected under the viscoelastic to stain the lens capsule.  BSS was then used to wash the dye out.  Additional Healon 5 was placed into the anterior chamber.  A curvilinear capsulorrhexis was made with a cystotome and capsulorrhexis forceps.  Balanced salt solution was used to hydrodissect and hydrodelineate the nucleus.  Viscoat was then placed in  the anterior chamber.   Phacoemulsification was then used in stop and chop fashion to remove the lens nucleus and epinucleus.  The remaining cortex was then removed using the irrigation and aspiration handpiece. Provisc was then placed into the capsular bag to distend it for lens placement.  A 25.0 -diopter lens was then injected into the capsular bag.  The remaining viscoelastic was aspirated.   Wounds were hydrated with balanced salt solution.  The anterior chamber was inflated to a physiologic pressure with balanced salt solution. Vigamox 0.2 ml of a 1mg  per ml solution was injected into the anterior chamber for a dose of 0.2 mg of intracameral antibiotic at the completion of the case.  No wound leaks were noted.  Topical combuigan drops and Maxitrol ointment were applied to the eye.  The patient was taken to the recovery room in stable condition without complications of anesthesia or surgery.  [name] 11/08/2019, 8:05 AM

## 2019-11-08 NOTE — Transfer of Care (Signed)
Immediate Anesthesia Transfer of Care Note  Patient: Paul [name].  Procedure(s) Performed: CATARACT EXTRACTION PHACO AND INTRAOCULAR LENS PLACEMENT (IOC) RIGHT DIABETIC HEALON 5 VISION BLUE (Right Eye)  Patient Location: PACU  Anesthesia Type: MAC  Level of Consciousness: awake, alert  and patient cooperative  Airway and Oxygen Therapy: Patient Spontanous Breathing   Post-op Assessment: Post-op Vital signs reviewed, Patient's Cardiovascular Status Stable, Respiratory Function Stable, Patent Airway and No signs of Nausea or vomiting  Post-op Vital Signs: Reviewed and stable  Complications: No complications documented.

## 2019-11-08 NOTE — Anesthesia Procedure Notes (Signed)
Procedure Name: MAC Date/Time: 11/08/2019 7:46 AM Performed by: Vanetta Shawl, CRNA Pre-anesthesia Checklist: Patient identified, Emergency Drugs available, Suction available, Timeout performed and Patient being monitored Patient Re-evaluated:Patient Re-evaluated prior to induction Oxygen Delivery Method: Nasal cannula Placement Confirmation: positive ETCO2

## 2019-11-08 NOTE — H&P (Signed)

## 2019-11-08 NOTE — Anesthesia Postprocedure Evaluation (Signed)
Anesthesia Post Note  Patient: Paul [name].  Procedure(s) Performed: CATARACT EXTRACTION PHACO AND INTRAOCULAR LENS PLACEMENT (IOC) RIGHT DIABETIC HEALON 5 VISION BLUE (Right Eye)     Patient location during evaluation: PACU Anesthesia Type: MAC Level of consciousness: awake and alert Pain management: pain level controlled Vital Signs Assessment: post-procedure vital signs reviewed and stable Respiratory status: spontaneous breathing, nonlabored ventilation, respiratory function stable and patient connected to nasal cannula oxygen Cardiovascular status: stable and blood pressure returned to baseline Postop Assessment: no apparent nausea or vomiting Anesthetic complications: no   No complications documented.  Andruw Battie A  Cinch Ormond

## 2019-11-09 ENCOUNTER — Other Ambulatory Visit: Admission: RE | Admit: 2019-11-09 | Payer: Medicare HMO | Source: Ambulatory Visit

## 2019-11-09 ENCOUNTER — Encounter: Payer: Self-pay | Admitting: Ophthalmology

## 2019-11-10 ENCOUNTER — Ambulatory Visit (INDEPENDENT_AMBULATORY_CARE_PROVIDER_SITE_OTHER): Payer: Medicare HMO | Admitting: Nurse Practitioner

## 2019-11-10 ENCOUNTER — Emergency Department
Admit: 2019-11-10 | Discharge: 2019-11-10 | Disposition: A | Discharge status: Home / Self Care | Payer: Medicare HMO | Attending: Cardiovascular Disease | Admitting: Cardiovascular Disease

## 2019-11-10 ENCOUNTER — Emergency Department (INDEPENDENT_AMBULATORY_CARE_PROVIDER_SITE_OTHER): Payer: Medicare HMO

## 2019-11-10 ENCOUNTER — Other Ambulatory Visit: Payer: Self-pay

## 2019-11-10 ENCOUNTER — Inpatient Hospital Stay
Admission: EM | Admit: 2019-11-10 | Discharge: 2019-11-11 | DRG: 987 | Disposition: A | Discharge status: Other Acute Inpatient Hospital | Payer: Medicare HMO | Attending: Internal Medicine | Admitting: Internal Medicine

## 2019-11-10 ENCOUNTER — Emergency Department: Payer: Medicare HMO

## 2019-11-10 ENCOUNTER — Other Ambulatory Visit: Payer: Self-pay | Admitting: Internal Medicine

## 2019-11-10 DIAGNOSIS — R55 Syncope and collapse: Secondary | ICD-10-CM | POA: Diagnosis present

## 2019-11-10 DIAGNOSIS — Z95810 Presence of automatic (implantable) cardiac defibrillator: Secondary | ICD-10-CM

## 2019-11-10 DIAGNOSIS — R7989 Other specified abnormal findings of blood chemistry: Secondary | ICD-10-CM | POA: Diagnosis present

## 2019-11-10 DIAGNOSIS — Z794 Long term (current) use of insulin: Secondary | ICD-10-CM

## 2019-11-10 DIAGNOSIS — I472 Ventricular tachycardia, unspecified: Secondary | ICD-10-CM

## 2019-11-10 DIAGNOSIS — E1152 Type 2 diabetes mellitus with diabetic peripheral angiopathy with gangrene: Secondary | ICD-10-CM | POA: Diagnosis present

## 2019-11-10 DIAGNOSIS — I480 Paroxysmal atrial fibrillation: Secondary | ICD-10-CM | POA: Diagnosis present

## 2019-11-10 DIAGNOSIS — N186 End stage renal disease: Secondary | ICD-10-CM

## 2019-11-10 DIAGNOSIS — R079 Chest pain, unspecified: Secondary | ICD-10-CM

## 2019-11-10 DIAGNOSIS — I252 Old myocardial infarction: Secondary | ICD-10-CM

## 2019-11-10 DIAGNOSIS — Z833 Family history of diabetes mellitus: Secondary | ICD-10-CM

## 2019-11-10 DIAGNOSIS — Z992 Dependence on renal dialysis: Secondary | ICD-10-CM

## 2019-11-10 DIAGNOSIS — L97829 Non-pressure chronic ulcer of other part of left lower leg with unspecified severity: Secondary | ICD-10-CM | POA: Diagnosis present

## 2019-11-10 DIAGNOSIS — Z8249 Family history of ischemic heart disease and other diseases of the circulatory system: Secondary | ICD-10-CM

## 2019-11-10 DIAGNOSIS — Z7902 Long term (current) use of antithrombotics/antiplatelets: Secondary | ICD-10-CM

## 2019-11-10 DIAGNOSIS — I6523 Occlusion and stenosis of bilateral carotid arteries: Secondary | ICD-10-CM | POA: Diagnosis present

## 2019-11-10 DIAGNOSIS — L98499 Non-pressure chronic ulcer of skin of other sites with unspecified severity: Secondary | ICD-10-CM | POA: Diagnosis present

## 2019-11-10 DIAGNOSIS — R778 Other specified abnormalities of plasma proteins: Secondary | ICD-10-CM | POA: Diagnosis present

## 2019-11-10 DIAGNOSIS — I959 Hypotension, unspecified: Secondary | ICD-10-CM

## 2019-11-10 DIAGNOSIS — I96 Gangrene, not elsewhere classified: Secondary | ICD-10-CM

## 2019-11-10 DIAGNOSIS — E11622 Type 2 diabetes mellitus with other skin ulcer: Secondary | ICD-10-CM | POA: Diagnosis present

## 2019-11-10 DIAGNOSIS — I953 Hypotension of hemodialysis: Secondary | ICD-10-CM | POA: Diagnosis not present

## 2019-11-10 DIAGNOSIS — Z20822 Contact with and (suspected) exposure to covid-19: Secondary | ICD-10-CM | POA: Diagnosis present

## 2019-11-10 DIAGNOSIS — I5022 Chronic systolic (congestive) heart failure: Secondary | ICD-10-CM | POA: Diagnosis present

## 2019-11-10 DIAGNOSIS — T82198A Other mechanical complication of other cardiac electronic device, initial encounter: Secondary | ICD-10-CM | POA: Diagnosis present

## 2019-11-10 DIAGNOSIS — Z83438 Family history of other disorder of lipoprotein metabolism and other lipidemia: Secondary | ICD-10-CM

## 2019-11-10 DIAGNOSIS — E1136 Type 2 diabetes mellitus with diabetic cataract: Secondary | ICD-10-CM | POA: Diagnosis present

## 2019-11-10 DIAGNOSIS — M109 Gout, unspecified: Secondary | ICD-10-CM | POA: Diagnosis present

## 2019-11-10 DIAGNOSIS — G4730 Sleep apnea, unspecified: Secondary | ICD-10-CM | POA: Diagnosis present

## 2019-11-10 DIAGNOSIS — Z951 Presence of aortocoronary bypass graft: Secondary | ICD-10-CM

## 2019-11-10 DIAGNOSIS — E1122 Type 2 diabetes mellitus with diabetic chronic kidney disease: Secondary | ICD-10-CM | POA: Diagnosis present

## 2019-11-10 DIAGNOSIS — I749 Embolism and thrombosis of unspecified artery: Secondary | ICD-10-CM

## 2019-11-10 DIAGNOSIS — I255 Ischemic cardiomyopathy: Secondary | ICD-10-CM | POA: Diagnosis present

## 2019-11-10 DIAGNOSIS — I2510 Atherosclerotic heart disease of native coronary artery without angina pectoris: Secondary | ICD-10-CM | POA: Diagnosis present

## 2019-11-10 DIAGNOSIS — Z89511 Acquired absence of right leg below knee: Secondary | ICD-10-CM

## 2019-11-10 DIAGNOSIS — Z4502 Encounter for adjustment and management of automatic implantable cardiac defibrillator: Secondary | ICD-10-CM | POA: Diagnosis not present

## 2019-11-10 DIAGNOSIS — H2589 Other age-related cataract: Secondary | ICD-10-CM | POA: Diagnosis present

## 2019-11-10 DIAGNOSIS — R0789 Other chest pain: Secondary | ICD-10-CM | POA: Diagnosis not present

## 2019-11-10 DIAGNOSIS — I132 Hypertensive heart and chronic kidney disease with heart failure and with stage 5 chronic kidney disease, or end stage renal disease: Secondary | ICD-10-CM | POA: Diagnosis present

## 2019-11-10 DIAGNOSIS — Z7952 Long term (current) use of systemic steroids: Secondary | ICD-10-CM

## 2019-11-10 DIAGNOSIS — Z95820 Peripheral vascular angioplasty status with implants and grafts: Secondary | ICD-10-CM

## 2019-11-10 DIAGNOSIS — Z888 Allergy status to other drugs, medicaments and biological substances status: Secondary | ICD-10-CM

## 2019-11-10 DIAGNOSIS — Z79899 Other long term (current) drug therapy: Secondary | ICD-10-CM

## 2019-11-10 DIAGNOSIS — Z8673 Personal history of transient ischemic attack (TIA), and cerebral infarction without residual deficits: Secondary | ICD-10-CM

## 2019-11-10 DIAGNOSIS — Z886 Allergy status to analgesic agent status: Secondary | ICD-10-CM

## 2019-11-10 DIAGNOSIS — E119 Type 2 diabetes mellitus without complications: Secondary | ICD-10-CM

## 2019-11-10 DIAGNOSIS — S88111A Complete traumatic amputation at level between knee and ankle, right lower leg, initial encounter: Secondary | ICD-10-CM | POA: Diagnosis present

## 2019-11-10 LAB — SARS CORONAVIRUS 2 BY RT PCR (HOSPITAL ORDER, PERFORMED IN ~~LOC~~ HOSPITAL LAB): SARS Coronavirus 2: NEGATIVE

## 2019-11-10 LAB — APTT: aPTT: 27 seconds (ref 24–36)

## 2019-11-10 LAB — LIPASE, BLOOD: Lipase: 50 U/L (ref 11–51)

## 2019-11-10 LAB — COMPREHENSIVE METABOLIC PANEL
ALT: 25 U/L (ref 0–44)
AST: 22 U/L (ref 15–41)
Albumin: 3.8 g/dL (ref 3.5–5.0)
Alkaline Phosphatase: 94 U/L (ref 38–126)
Anion gap: 14 (ref 5–15)
BUN: 48 mg/dL — ABNORMAL HIGH (ref 6–20)
CO2: 23 mmol/L (ref 22–32)
Calcium: 8.4 mg/dL — ABNORMAL LOW (ref 8.9–10.3)
Chloride: 98 mmol/L (ref 98–111)
Creatinine, Ser: 9.47 mg/dL — ABNORMAL HIGH (ref 0.61–1.24)
GFR calc Af Amer: 6 mL/min — ABNORMAL LOW (ref >60)
GFR calc non Af Amer: 6 mL/min — ABNORMAL LOW (ref >60)
Glucose, Bld: 209 mg/dL — ABNORMAL HIGH (ref 70–99)
Potassium: 3.9 mmol/L (ref 3.5–5.1)
Sodium: 135 mmol/L (ref 135–145)
Total Bilirubin: 0.6 mg/dL (ref 0.3–1.2)
Total Protein: 7.6 g/dL (ref 6.5–8.1)

## 2019-11-10 LAB — CBC WITH DIFFERENTIAL/PLATELET
Abs Immature Granulocytes: 0.14 10*3/uL — ABNORMAL HIGH (ref 0.00–0.07)
Basophils Absolute: 0 10*3/uL (ref 0.0–0.1)
Basophils Relative: 0 %
Eosinophils Absolute: 0 10*3/uL (ref 0.0–0.5)
Eosinophils Relative: 0 %
HCT: 40.5 % (ref 39.0–52.0)
Hemoglobin: 13 g/dL (ref 13.0–17.0)
Immature Granulocytes: 1 %
Lymphocytes Relative: 10 %
Lymphs Abs: 1.5 10*3/uL (ref 0.7–4.0)
MCH: 31.3 pg (ref 26.0–34.0)
MCHC: 32.1 g/dL (ref 30.0–36.0)
MCV: 97.6 fL (ref 80.0–100.0)
Monocytes Absolute: 0.7 10*3/uL (ref 0.1–1.0)
Monocytes Relative: 4 %
Neutro Abs: 13.1 10*3/uL — ABNORMAL HIGH (ref 1.7–7.7)
Neutrophils Relative %: 85 %
Platelets: 336 10*3/uL (ref 150–400)
RBC: 4.15 MIL/uL — ABNORMAL LOW (ref 4.22–5.81)
RDW: 13.5 % (ref 11.5–15.5)
WBC: 15.4 10*3/uL — ABNORMAL HIGH (ref 4.0–10.5)
nRBC: 0 % (ref 0.0–0.2)

## 2019-11-10 LAB — MAGNESIUM: Magnesium: 2.2 mg/dL (ref 1.7–2.4)

## 2019-11-10 LAB — PROTIME-INR
INR: 1 (ref 0.8–1.2)
Prothrombin Time: 12.4 seconds (ref 11.4–15.2)

## 2019-11-10 LAB — TROPONIN I (HIGH SENSITIVITY)
Troponin I (High Sensitivity): 185 ng/L (ref <18)
Troponin I (High Sensitivity): 150 ng/L (ref <18)

## 2019-11-10 LAB — GLUCOSE, CAPILLARY: Glucose-Capillary: 205 mg/dL — ABNORMAL HIGH (ref 70–99)

## 2019-11-10 MED ORDER — ACETAMINOPHEN 650 MG RE SUPP: 650.0000 mg | Freq: Four times a day (QID) | RECTAL | Status: DC | PRN

## 2019-11-10 MED ORDER — PREDNISONE 10 MG PO TABS: 10.0000 mg | ORAL_TABLET | ORAL | Status: DC

## 2019-11-10 MED ORDER — MIDODRINE HCL 5 MG PO TABS: 10.0000 mg | ORAL_TABLET | ORAL | Status: DC

## 2019-11-10 MED ORDER — HYDROCODONE-ACETAMINOPHEN 5-325 MG PO TABS
1.0000 | ORAL_TABLET | ORAL | Status: DC | PRN
  Administered 2019-11-10: 1 via ORAL
  Administered 2019-11-11 (×2): 2 via ORAL
  Filled 2019-11-10 (×3): qty 2
  Filled 2019-11-10: qty 1

## 2019-11-10 MED ORDER — CALCIUM ACETATE (PHOS BINDER) 667 MG PO CAPS
1334.0000 mg | ORAL_CAPSULE | Freq: Three times a day (TID) | ORAL | Status: DC
  Filled 2019-11-10 (×5): qty 2

## 2019-11-10 MED ORDER — MORPHINE SULFATE (PF) 2 MG/ML IV SOLN: 2.0000 mg | INTRAVENOUS | Status: DC | PRN

## 2019-11-10 MED ORDER — HEPARIN SODIUM (PORCINE) 5000 UNIT/ML IJ SOLN
5000.0000 [IU] | Freq: Three times a day (TID) | INTRAMUSCULAR | Status: DC
  Filled 2019-11-10 (×2): qty 1

## 2019-11-10 MED ORDER — CLOPIDOGREL BISULFATE 75 MG PO TABS
75.0000 mg | ORAL_TABLET | Freq: Every day | ORAL | Status: DC
  Administered 2019-11-11: 75 mg via ORAL
  Filled 2019-11-10: qty 1

## 2019-11-10 MED ORDER — MOXIFLOXACIN HCL 0.5 % OP SOLN
1.0000 [drp] | Freq: Four times a day (QID) | OPHTHALMIC | Status: DC
  Administered 2019-11-11 (×4): 1 [drp] via OPHTHALMIC
  Filled 2019-11-10: qty 3

## 2019-11-10 MED ORDER — ONDANSETRON HCL 4 MG PO TABS: 4.0000 mg | ORAL_TABLET | Freq: Four times a day (QID) | ORAL | Status: DC | PRN

## 2019-11-10 MED ORDER — ACETAMINOPHEN 325 MG PO TABS
650.0000 mg | ORAL_TABLET | Freq: Four times a day (QID) | ORAL | Status: DC | PRN
  Administered 2019-11-11: 650 mg via ORAL
  Filled 2019-11-10: qty 2

## 2019-11-10 MED ORDER — ATORVASTATIN CALCIUM 20 MG PO TABS
40.0000 mg | ORAL_TABLET | Freq: Every day | ORAL | Status: DC
  Administered 2019-11-11: 40 mg via ORAL
  Filled 2019-11-10: qty 2

## 2019-11-10 MED ORDER — OXYCODONE HCL 5 MG PO TABS
5.0000 mg | ORAL_TABLET | Freq: Once | ORAL | Status: DC
  Filled 2019-11-10: qty 1

## 2019-11-10 MED ORDER — INSULIN ASPART 100 UNIT/ML ~~LOC~~ SOLN: 0.0000 [IU] | Freq: Every day | SUBCUTANEOUS | Status: DC

## 2019-11-10 MED ORDER — AMIODARONE HCL 200 MG PO TABS
200.0000 mg | ORAL_TABLET | Freq: Every day | ORAL | Status: DC
  Administered 2019-11-11: 200 mg via ORAL
  Filled 2019-11-10: qty 1

## 2019-11-10 MED ORDER — NEOMYCIN-POLYMYXIN-DEXAMETH 3.5-10000-0.1 OP SUSP
1.0000 [drp] | Freq: Four times a day (QID) | OPHTHALMIC | Status: DC
  Administered 2019-11-11 (×4): 1 [drp] via OPHTHALMIC
  Filled 2019-11-10: qty 5

## 2019-11-10 MED ORDER — ONDANSETRON HCL 4 MG/2ML IJ SOLN: 4.0000 mg | Freq: Four times a day (QID) | INTRAMUSCULAR | Status: DC | PRN

## 2019-11-10 MED ORDER — INSULIN ASPART 100 UNIT/ML ~~LOC~~ SOLN: 0.0000 [IU] | Freq: Three times a day (TID) | SUBCUTANEOUS | Status: DC

## 2019-11-10 MED ORDER — BRIMONIDINE TARTRATE-TIMOLOL 0.2-0.5 % OP SOLN
1.0000 [drp] | Freq: Four times a day (QID) | OPHTHALMIC | Status: DC
  Filled 2019-11-10: qty 5

## 2019-11-10 NOTE — H&P (Signed)
History and Physical    Paul [name]. HLK:[id number] DOB: Aug 18, 1965 DOA: 11/10/2019  PCP: Tracie Harrier, MD   Patient coming from: Home  I have personally briefly reviewed patient's old medical records in [place]  Chief Complaint: Syncope, AICD discharge while at dialysis, with post fire chest pain  HPI: Paul [name]. is a 54 y.o. male with medical history significant for ESRD on HD MWF, HFrEF (EF 25 to 30%) status post AICD, diabetes, paroxysmal A. fib not on anticoagulation, V. tach on amiodarone, chronic hypotension on midodrine, anemia of CKD and PVD status post right BKA, who presents from dialysis following a syncopal episode followed by AICD firing 45 minutes prior to the end of dialysis.  Patient was recently hospitalized from 6/29-7 /3/21 for syncopal episode secondary to V. tach, and was started on amiodarone.  That stay was also complicated by symptomatic hypotension during dialysis requiring midodrine plus prednisone with plans for consideration of TEE for work-up for endocarditis.  Patient was doing well following discharge until the episode during dialysis earlier.  Patient states that he was undergoing dialysis and felt lightheaded and thinks he passed out briefly.  He came around and as he bent over to pick up the things that had fallen out of his hand he experienced a shock.  He subsequently developed chest pain and he was sent to the emergency room with 45 minutes left to go on dialysis. ED Course: By arrival, he was awake and alert and chest pain had for the most part resolved.  Blood work revealed normal potassium, magnesium of 2.2.  Troponin 185>>150.  EKG showed ventricular paced rhythm at 82 with no acute ST T wave changes no T wave inversions similar to prior EKGs.  Chest x-ray with minimal lingular atelectasis or scarring.  The emergency room provider spoke with cardiologist Dr. Rockey Situ who subsequently spoke with EP, Dr. Caryl Comes recommending transfer.  Patient  was accepted in transfer to [facility], his preference as this is where his primary EP is located.  Given no bed availability for 24 to 48 hours, Dr.: Advised hospitalist admission pending transfer.  Accepting physician: Dr. Armandina Gemma  Review of Systems: As per HPI otherwise all other systems on review of systems negative.    Past Medical History:  Diagnosis Date  . AICD (automatic cardioverter/defibrillator) present 2011  . Anemia   . Cardiac defibrillator in place    a. Biotronik LUmax 540 DRT, (ser # [id number]).  . Carotid arterial disease ([place])    a. s/p prior LICA stenting;  b. 05/1570 Carotid U/S: 40-59% bilat ICA stenosis. Patent LICA stent.  . CHF (congestive heart failure) ([place])   . CKD (chronic kidney disease), stage III   . Coronary artery disease    a. 2010 s/p CABG x 3.  . DDD (degenerative disc disease), lumbosacral    L5-S1  . Diabetes ([place])    Lantus at bedtime  . Dialysis patient [facility])    M,W,F  . Employs prosthetic leg    Right BKA  . Gangrene of toe of right foot ([place])   . Gout of left hand 10/06/2016  . HFrEF (heart failure with reduced ejection fraction) ([place])    a. 07/2016 Echo: EF 25-30%, diff HK, mild MR, mildly dil LA, mod reduced RV fxn, PASP 2mmHg.  Marland Kitchen Hypertension    takes Coreg daily  . Ischemic cardiomyopathy    a. 07/2016 Echo: EF 25-30%, diff HK.  . Myocardial infarction ([place]) 2009  .  Peripheral vascular disease ([place])   . Sleep apnea    sleep study yr ago-unable to afford cpap  . Stroke [facility]) 09   no weakness    Past Surgical History:  Procedure Laterality Date  . AMPUTATION Right 12/23/2018   Procedure: AMPUTATION BELOW KNEE (Right);  Surgeon: Algernon Huxley, MD;  Location: [facility] ORS;  Service: General;  Laterality: Right;  . AMPUTATION TOE Right 08/22/2016   Procedure: AMPUTATION TOE;  Surgeon: Samara Deist, DPM;  Location: [facility];  Service: Podiatry;  Laterality: Right;  . AMPUTATION TOE Right 10/09/2016   Procedure: AMPUTATION TOE-RIGHT 2ND  MPJ;  Surgeon: Samara Deist, DPM;  Location: [facility];  Service: Podiatry;  Laterality: Right;  . APLIGRAFT PLACEMENT Right 10/09/2016   Procedure: APLIGRAFT PLACEMENT;  Surgeon: Samara Deist, DPM;  Location: [facility];  Service: Podiatry;  Laterality: Right;  . AV FISTULA PLACEMENT Right 05/11/2019   Procedure: INSERTION OF ARTERIOVENOUS (AV) GORE-TEX GRAFT ARM;  Surgeon: Algernon Huxley, MD;  Location: [facility];  Service: Vascular;  Laterality: Right;  . CALCANEAL OSTEOTOMY Bilateral 07/28/2018   Procedure: RIGHT CALCANECTOMY BILATERAL DEBRIDEMENT OF ULCERS ON HEELS;  Surgeon: Albertine Patricia, DPM;  Location: [facility];  Service: Podiatry;  Laterality: Bilateral;  . CARDIAC DEFIBRILLATOR PLACEMENT  2011  . CAROTID ENDARTERECTOMY Left   . CATARACT EXTRACTION W/PHACO Right 11/08/2019   Procedure: CATARACT EXTRACTION PHACO AND INTRAOCULAR LENS PLACEMENT ([place]) RIGHT DIABETIC HEALON 5 VISION BLUE;  Surgeon: Leandrew Koyanagi, MD;  Location: [place];  Service: Ophthalmology;  Laterality: Right;  20.23 1:54.0 17.7%  . CHOLECYSTECTOMY  2010  . CORONARY ARTERY BYPASS GRAFT  2010   CABG x 3   . DIALYSIS/PERMA CATHETER INSERTION N/A 10/07/2018   Procedure: DIALYSIS/PERMA CATHETER INSERTION;  Surgeon: Katha Cabal, MD;  Location: [place] CV LAB;  Service: Cardiovascular;  Laterality: N/A;  . GRAFT APPLICATION Right 7/51/7001   Procedure: FULL THICKNESS SKIN GRAFT-RIGHT FOOT;  Surgeon: Algernon Huxley, MD;  Location: [facility];  Service: Vascular;  Laterality: Right;  . HIP SURGERY Right 1994  . INCISION AND DRAINAGE Right 09/08/2018   Procedure: INCISION AND DRAINAGE - [place] OF DEFECTIVE SKIN, SOFT TISSUE AND BONE;  Surgeon: Albertine Patricia, DPM;  Location: [facility];  Service: Podiatry;  Laterality: Right;  . INCISION AND DRAINAGE OF WOUND Right 08/22/2016   Procedure: IRRIGATION AND DEBRIDEMENT WOUND and wound vac placement;  Surgeon: Samara Deist, DPM;  Location: [facility];   Service: Podiatry;  Laterality: Right;  . IRRIGATION AND DEBRIDEMENT ABSCESS Right 11/24/2018   Procedure: IRRIGATION AND DEBRIDEMENT ABSCESS RIGHT FOOT, COMPLICATED, DIABETIC;  Surgeon: Albertine Patricia, DPM;  Location: [facility];  Service: Podiatry;  Laterality: Right;  . LOWER EXTREMITY ANGIOGRAPHY Right 08/24/2016   Procedure: Lower Extremity Angiography;  Surgeon: Algernon Huxley, MD;  Location: [place] CV [facility];  Service: Cardiovascular;  Laterality: Right;  . LOWER EXTREMITY ANGIOGRAPHY Right 07/27/2018   Procedure: RIGHT Lower Extremity Angiography;  Surgeon: Algernon Huxley, MD;  Location: [place] CV LAB;  Service: Cardiovascular;  Laterality: Right;  . LOWER EXTREMITY ANGIOGRAPHY Right 09/09/2018   Procedure: Lower Extremity Angiography;  Surgeon: Katha Cabal, MD;  Location: [place] [facility];  Service: Cardiovascular;  Laterality: Right;  . MASS EXCISION Right 07/18/2014   Procedure: EXCISION HETEROTOPIC BONE RIGHT HIP;  Surgeon: Frederik Pear, MD;  Location: [place];  Service: Orthopedics;  Laterality: Right;  . Open Heart Surgery  2010   x 3  . PERIPHERAL VASCULAR  THROMBECTOMY Right 07/31/2019   Procedure: PERIPHERAL VASCULAR THROMBECTOMY;  Surgeon: Algernon Huxley, MD;  Location: [place] CV [facility];  Service: Cardiovascular;  Laterality: Right;  . PERIPHERAL VASCULAR THROMBECTOMY Right 08/17/2019   Procedure: PERIPHERAL VASCULAR THROMBECTOMY;  Surgeon: Algernon Huxley, MD;  Location: [place] CV LAB;  Service: Cardiovascular;  Laterality: Right;  . PERIPHERAL VASCULAR THROMBECTOMY Right 09/14/2019   Procedure: PERIPHERAL VASCULAR THROMBECTOMY;  Surgeon: Algernon Huxley, MD;  Location: [place] [facility];  Service: Cardiovascular;  Laterality: Right;  . WOUND DEBRIDEMENT Right 10/09/2016   Procedure: DEBRIDEMENT WOUND;  Surgeon: Samara Deist, DPM;  Location: [facility];  Service: Podiatry;  Laterality: Right;     reports that he has never smoked. He has never used smokeless tobacco.  He reports current drug use. Drug: Marijuana. He reports that he does not drink alcohol.  Allergies  Allergen Reactions  . Other Other (See Comments)    Cardiac Problems. Pt states he tolerates Toradol. Due to kidney and heart problems per pt  . Baclofen Other (See Comments)    vertigo  . Metformin Diarrhea  . Nsaids     Due to kidney and heart problems per pt. Pt states he tolerates Toradol    Family History  Problem Relation Age of Onset  . Hypertension Other   . Diabetes Other   . Diabetes Father   . Hyperlipidemia Father   . Hypertension Father   . Hyperlipidemia Sister   . Hypertension Sister   . Diabetes Sister   . Diabetes Brother   . Hypertension Brother   . Hyperlipidemia Brother       Prior to Admission medications   Medication Sig Start Date End Date Taking? Authorizing Provider  allopurinol (ZYLOPRIM) 100 MG tablet Take 1 tablet (100 mg total) by mouth daily. 08/02/18  Yes Fritzi Mandes, MD  amiodarone (PACERONE) 200 MG tablet Take 200 mg by mouth daily.   Yes [provider]  atorvastatin (LIPITOR) 40 MG tablet Take 1 tablet (40 mg total) by mouth daily. 11/05/19  Yes Sharen Hones, MD  brimonidine-timolol (COMBIGAN) 0.2-0.5 % ophthalmic solution Place 1 drop into the right eye in the morning, at noon, in the evening, and at bedtime.   Yes [provider]  calcium acetate (PHOSLO) 667 MG capsule Take 2 capsules (1,334 mg total) by mouth 3 (three) times daily with meals. 11/04/19  Yes Sharen Hones, MD  clopidogrel (PLAVIX) 75 MG tablet Take 1 tablet (75 mg total) by mouth daily with breakfast. 07/31/19  Yes Dew, Erskine Squibb, MD  moxifloxacin (VIGAMOX) 0.5 % ophthalmic solution Place 1 drop into the right eye in the morning, at noon, in the evening, and at bedtime.   Yes [provider]  neomycin-polymyxin b-dexamethasone (MAXITROL) 3.5-10000-0.1 SUSP Place 1 drop into the right eye every 6 (six) hours.   Yes [provider]  predniSONE  (DELTASONE) 10 MG tablet Take 10 mg by mouth every other day.    Yes [provider]  HYDROcodone-acetaminophen (NORCO) 5-325 MG tablet Take 1 tablet by mouth every 6 (six) hours as needed for moderate pain. 10/23/19   Carrie Mew, MD  midodrine (PROAMATINE) 10 MG tablet Take 1 tablet (10 mg total) by mouth every Monday, Wednesday, and Friday with hemodialysis. 11/06/19   Sharen Hones, MD    Physical Exam: Vitals:   11/10/19 1607 11/10/19 1728 11/10/19 1800 11/10/19 1804  BP: 135/69  (!) 125/59   Pulse:      Resp:  14  13 16  Temp:      TempSrc:      SpO2:    97%  Weight:      Height:         Vitals:   11/10/19 1607 11/10/19 1728 11/10/19 1800 11/10/19 1804  BP: 135/69  (!) 125/59   Pulse:      Resp:  14 13 16   Temp:      TempSrc:      SpO2:    97%  Weight:      Height:          Constitutional: Alert and oriented x 3 . Not in any apparent distress HEENT:      Head: Normocephalic and atraumatic.         Eyes: PERLA, EOMI, Conjunctivae are normal. Sclera is non-icteric.       Mouth/Throat: Mucous membranes are moist.       Neck: Supple with no signs of meningismus. Cardiovascular: Regular rate and rhythm.  3 out of 6 murmur heard best left sternal border,2+ symmetrical distal pulses are present . No JVD. No LE edema Respiratory: Respiratory effort normal .Lungs sounds clear bilaterally. No wheezes, crackles, or rhonchi.  Gastrointestinal: Soft, non tender, and non distended with positive bowel sounds. No rebound or guarding. Genitourinary: No CVA tenderness. Musculoskeletal: Nontender with normal range of motion in all extremities.  Necrotic changes right first and fourth fingers Neurologic: Normal speech and language. Face is symmetric. Moving all extremities. No gross focal neurologic deficits . Skin: Skin is warm, dry.  Necrotic changes right first and fourth fingers, wound left lower extremity, 2 cm diameter anterior left lower leg Psychiatric: Mood and  affect are normal Speech and behavior are normal   Labs on Admission: I have personally reviewed following labs and imaging studies  CBC: Recent Labs  Lab 11/04/19 0723 11/10/19 1219  WBC 14.5* 15.4*  NEUTROABS 11.8* 13.1*  HGB 13.0 13.0  HCT 40.6 40.5  MCV 98.1 97.6  PLT 273 235   Basic Metabolic Panel: Recent Labs  Lab 11/04/19 0723 11/10/19 1219  NA 135 135  K 4.7 3.9  CL 96* 98  CO2 24 23  GLUCOSE 133* 209*  BUN 46* 48*  CREATININE 8.74* 9.47*  CALCIUM 8.4* 8.4*  MG  --  2.2   GFR: Estimated Creatinine Clearance: 9.6 mL/min (A) (by C-G formula based on SCr of 9.47 mg/dL (H)). Liver Function Tests: Recent Labs  Lab 11/10/19 1219  AST 22  ALT 25  ALKPHOS 94  BILITOT 0.6  PROT 7.6  ALBUMIN 3.8   Recent Labs  Lab 11/10/19 1219  LIPASE 50   No results for input(s): AMMONIA in the last 168 hours. Coagulation Profile: Recent Labs  Lab 11/10/19 1219  INR 1.0   Cardiac Enzymes: No results for input(s): CKTOTAL, CKMB, CKMBINDEX, TROPONINI in the last 168 hours. BNP (last 3 results) No results for input(s): PROBNP in the last 8760 hours. HbA1C: No results for input(s): HGBA1C in the last 72 hours. CBG: Recent Labs  Lab 11/03/19 2052 11/04/19 0738 11/04/19 1218 11/08/19 0644  GLUCAP 208* 128* 192* 98   Lipid Profile: No results for input(s): CHOL, HDL, LDLCALC, TRIG, CHOLHDL, LDLDIRECT in the last 72 hours. Thyroid Function Tests: No results for input(s): TSH, T4TOTAL, FREET4, T3FREE, THYROIDAB in the last 72 hours. Anemia Panel: No results for input(s): VITAMINB12, FOLATE, FERRITIN, TIBC, IRON, RETICCTPCT in the last 72 hours. Urine analysis:    Component Value Date/Time   COLORURINE YELLOW (  A) 10/05/2018 1326   APPEARANCEUR HAZY (A) 10/05/2018 1326   LABSPEC 1.009 10/05/2018 1326   PHURINE 5.0 10/05/2018 1326   GLUCOSEU NEGATIVE 10/05/2018 1326   HGBUR SMALL (A) 10/05/2018 1326   BILIRUBINUR NEGATIVE 10/05/2018 1326   [place] 10/05/2018 1326   PROTEINUR 30 (A) 10/05/2018 1326   UROBILINOGEN 1.0 07/16/2014 1558   NITRITE NEGATIVE 10/05/2018 1326   LEUKOCYTESUR NEGATIVE 10/05/2018 1326    Radiological Exams on Admission: DG Chest Portable 1 View  Result Date: 11/10/2019 CLINICAL DATA:  Chest pain. EXAM: PORTABLE CHEST 1 VIEW COMPARISON:  October 31, 2019. FINDINGS: Stable cardiomediastinal silhouette. Status post coronary bypass graft. No pneumothorax or pleural effusion is noted. Left-sided pacemaker is unchanged in position. Right internal jugular dialysis catheter is unchanged. Right lung is clear. Minimal lingular subsegmental atelectasis or scarring is noted. Bony thorax is unremarkable. IMPRESSION: Minimal lingular subsegmental atelectasis or scarring. Electronically Signed   By: Marijo Conception M.D.   On: 11/10/2019 12:50    EKG: Independently reviewed. Interpretation : ventricular paced rhythm at 82 with no acute ST T wave changes no T wave inversions similar to prior EKGs  Assessment/Plan 54 year old male with history of ESRD on HD MWF, HFrEF (EF 25 to 30%) s/p AICD, diabetes, paroxysmal A. fib not on anticoagulation, V. tach on amiodarone, chronic hypotension on midodrine, anemia of CKD and PVD status post right BKA, and with necrotic ulcers right first and fourth fingers, who presents from dialysis following a syncopal episode followed by AICD firing 45 minutes prior to the end of dialysis.  Interrogation revealed V. tach of 148.  Admitted pending transfer to [facility].  Syncope suspect secondary to V. tach of 140 with AICD discharge -Syncopal episode while at dialysis followed by AICD fire.  Device interrogated with V. tach of 140 -Recent hospitalization 6/29 to 7/3 for syncope secondary to V. tach 150 -Currently in ventricular paced rhythm around in the 80s and hemodynamically stable -Continue amiodarone from recent discharge on 11/04/2019 -Electrolyte monitoring.  Keep potassium above 3.5 and  magnesium above 2 -Admitted pending transfer to [place] likely in 1 to 2 days to evaluate for possible problem with a lead.  Accepting physician, Dr. Armandina Gemma. EMTALA filled from ER -Cardiology consult [place] [facility]  Chest pain  Elevated troponin CAD -Likely secondary to AICD discharge.  ACS not suspected at this time -Chest pain already resolving and troponin trending down 185>>140 -Continue to monitor.  Right 1st and the 4th finger necrosis. -Patient was evaluated by vascular surgery during his recent hospital stay -Etiologies considered were"ischemia from right AV fistula andthrombosis.   -Patient was advised to return for ligation of his AV fistula according to discharge summary  Left ankle wound -Daily wound can consider podiatry consult  Paroxysmal atrial fibrillation -Currently rate controlled -Not on anticoagulation, reason uncertain.    Diabetes mellitus type II with complication (HCC) -Sliding scale insulin coverage pending med rec    Chronic systolic CHF (congestive heart failure) (HCC) -Currently euvolemic -Last EF 25 to 30%  PVD status post right BKA (HCC) -Increase nursing assistance with transfers.  Fall precautions    ESRD on dialysis [facility]) -Nephrology consult for continuation of dialysis    Idiopathic hypotension -Continue midodrine on dialysis days and prednisone as prescribed on hospital discharge on 11/04/2019 -As needed small boluses as needed if problematic    DVT prophylaxis: Heparin Code Status: full code  Family Communication:  none  Disposition Plan: Back to previous home environment Consults  called: Cardiology [facility], nephrology Status: Observation    Athena Masse MD [facility]     11/10/2019, 7:46 PM

## 2019-11-10 NOTE — ED Triage Notes (Signed)
Pt comes into the ED from home with c/o defibrillator going off x1 about 63min PTA, pt c/o having some chest pain at present. In NAD, states this happened to him about 2 weeks ago when he passed out.

## 2019-11-10 NOTE — ED Notes (Signed)
Pt provided washcloths and soap to clean himself per his request. Privacy provided per pt request- call light in reach.

## 2019-11-10 NOTE — ED Notes (Signed)
PT reports biotronic internal defibrillator fired during dialysis today.

## 2019-11-10 NOTE — ED Notes (Signed)
ETA 1 hour for defibrillator service

## 2019-11-10 NOTE — ED Provider Notes (Signed)
[facility] Emergency Department Provider Note  ____________________________________________   First MD Initiated Contact with Patient 11/10/19 1150     (approximate)  I have reviewed the triage vital signs and the nursing notes.   HISTORY  Chief Complaint Chest Pain    HPI Paul [name]. is a 54 y.o. male CAD s/p CABG in 2010, ischemic cardiomyopathy, HFrEF (EF 25-30%), s/p AICD, ESRD on MWF HD, type 2 diabetes, paroxysmal atrial fibrillation (not currently on anticoagulation), history of CVA, carotid artery disease s/p LICA stenting, PVD s/p right BKA, hypotension, anemia of chronic disease, and DJD with chronic low back pain who comes in with defibrillator going off.  Patient states that he had to cut his dialysis session 45 minutes short due to just not feeling well.  He states that he was sitting in his chair when he started to feel lightheaded and he passed out.  He stated that he leaned over to try to pick up the stuff he dropped on the ground and that he felt his ICD shocked him.  He had some chest pain after the shock that was constant, nothing made it better, nothing made it worse but is now getting better on its own.  He denies any shortness of breath, leg swelling or other symptoms at this time.  Stated that he did take his midodrine prior to dialysis.  On review of records patient was admitted in early July for his defibrillator going off as well.  Patient had runs of V. tach and was placed on amiodarone.  It was thought that his ventricular tachycardia was what caused the syncope.  Patient is a plan to have a TEE for possible endocarditis evaluation after discussion with Dr. Rockey Situ.          Past Medical History:  Diagnosis Date  . AICD (automatic cardioverter/defibrillator) present 2011  . Anemia   . Cardiac defibrillator in place    a. Biotronik LUmax 540 DRT, (ser # [id number]).  . Carotid arterial disease ([place])    a. s/p prior LICA  stenting;  b. 0/2542 Carotid U/S: 40-59% bilat ICA stenosis. Patent LICA stent.  . CHF (congestive heart failure) ([place])   . CKD (chronic kidney disease), stage III   . Coronary artery disease    a. 2010 s/p CABG x 3.  . DDD (degenerative disc disease), lumbosacral    L5-S1  . Diabetes ([place])    Lantus at bedtime  . Dialysis patient [facility])    M,W,F  . Employs prosthetic leg    Right BKA  . Gangrene of toe of right foot ([place])   . Gout of left hand 10/06/2016  . HFrEF (heart failure with reduced ejection fraction) ([place])    a. 07/2016 Echo: EF 25-30%, diff HK, mild MR, mildly dil LA, mod reduced RV fxn, PASP 65mmHg.  Marland Kitchen Hypertension    takes Coreg daily  . Ischemic cardiomyopathy    a. 07/2016 Echo: EF 25-30%, diff HK.  . Myocardial infarction ([place]) 2009  . Peripheral vascular disease ([place])   . Sleep apnea    sleep study yr ago-unable to afford cpap  . Stroke [facility]) 09   no weakness    Patient Active Problem List   Diagnosis Date Noted  . Open wound of ankle without complication, right, initial encounter 11/04/2019  . V tach ([place]) 11/01/2019  . Idiopathic hypotension   . Syncope 10/31/2019  . ESRD on dialysis ([place]) 02/21/2019  . Below-knee amputation of right lower  extremity ([place]) 01/23/2019  . Surgical wound infection 01/23/2019  . Right foot infection 12/21/2018  . AKI (acute kidney injury) ([place]) 10/04/2018  . Acute osteomyelitis of right ankle or foot ([place]) 09/06/2018  . Pressure ulcer of heel, right, unstageable ([place]) 09/06/2018  . Atrial flutter ([place]) 08/31/2018  . Personal history of gout 08/31/2018  . Shockable cardiac rhythm detected by automated external defibrillator 08/30/2018  . Acute osteomyelitis of right foot ([place]) 07/25/2018  . Osteomyelitis ([place]) 07/25/2018  . Anasarca 05/25/2018  . Pressure injury of skin 05/25/2018  . CKD (chronic kidney disease), stage IV ([place]) 12/15/2016  . Ulcerated, foot ([place]) 12/15/2016  . Gangrene of foot ([place]) 08/21/2016  . HTN  (hypertension) 08/21/2016  . Diabetes ([place]) 08/21/2016  . CAD (coronary artery disease) 08/21/2016  . Chronic systolic CHF (congestive heart failure) ([place]) 08/21/2016  . Diabetic foot infection ([place]) 08/21/2016  . Heterotopic ossification of bone 07/12/2014  . AICD (automatic cardioverter/defibrillator) present 2011  . PAF (paroxysmal atrial fibrillation) ([place]) 2011  . Elevated troponin 2011  . Ventricular tachycardia ([place]) 2011    Past Surgical History:  Procedure Laterality Date  . AMPUTATION Right 12/23/2018   Procedure: AMPUTATION BELOW KNEE (Right);  Surgeon: Algernon Huxley, MD;  Location: [facility];  Service: General;  Laterality: Right;  . AMPUTATION TOE Right 08/22/2016   Procedure: AMPUTATION TOE;  Surgeon: Samara Deist, DPM;  Location: [facility];  Service: Podiatry;  Laterality: Right;  . AMPUTATION TOE Right 10/09/2016   Procedure: AMPUTATION TOE-RIGHT 2ND MPJ;  Surgeon: Samara Deist, DPM;  Location: [facility];  Service: Podiatry;  Laterality: Right;  . APLIGRAFT PLACEMENT Right 10/09/2016   Procedure: APLIGRAFT PLACEMENT;  Surgeon: Samara Deist, DPM;  Location: [facility];  Service: Podiatry;  Laterality: Right;  . AV FISTULA PLACEMENT Right 05/11/2019   Procedure: INSERTION OF ARTERIOVENOUS (AV) GORE-TEX GRAFT ARM;  Surgeon: Algernon Huxley, MD;  Location: [facility];  Service: Vascular;  Laterality: Right;  . CALCANEAL OSTEOTOMY Bilateral 07/28/2018   Procedure: RIGHT CALCANECTOMY BILATERAL DEBRIDEMENT OF ULCERS ON HEELS;  Surgeon: Albertine Patricia, DPM;  Location: [facility];  Service: Podiatry;  Laterality: Bilateral;  . CARDIAC DEFIBRILLATOR PLACEMENT  2011  . CAROTID ENDARTERECTOMY Left   . CATARACT EXTRACTION W/PHACO Right 11/08/2019   Procedure: CATARACT EXTRACTION PHACO AND INTRAOCULAR LENS PLACEMENT ([place]) RIGHT DIABETIC HEALON 5 VISION BLUE;  Surgeon: Leandrew Koyanagi, MD;  Location: [place];  Service: Ophthalmology;  Laterality: Right;  20.23 1:54.0 17.7%  .  CHOLECYSTECTOMY  2010  . CORONARY ARTERY BYPASS GRAFT  2010   CABG x 3   . DIALYSIS/PERMA CATHETER INSERTION N/A 10/07/2018   Procedure: DIALYSIS/PERMA CATHETER INSERTION;  Surgeon: Katha Cabal, MD;  Location: [place] [facility];  Service: Cardiovascular;  Laterality: N/A;  . GRAFT APPLICATION Right 7/32/2025   Procedure: FULL THICKNESS SKIN GRAFT-RIGHT FOOT;  Surgeon: Algernon Huxley, MD;  Location: [facility];  Service: Vascular;  Laterality: Right;  . HIP SURGERY Right 1994  . INCISION AND DRAINAGE Right 09/08/2018   Procedure: INCISION AND DRAINAGE - [place] OF DEFECTIVE SKIN, SOFT TISSUE AND BONE;  Surgeon: Albertine Patricia, DPM;  Location: [facility];  Service: Podiatry;  Laterality: Right;  . INCISION AND DRAINAGE OF WOUND Right 08/22/2016   Procedure: IRRIGATION AND DEBRIDEMENT WOUND and wound vac placement;  Surgeon: Samara Deist, DPM;  Location: [facility];  Service: Podiatry;  Laterality: Right;  . IRRIGATION AND DEBRIDEMENT ABSCESS Right 11/24/2018   Procedure: IRRIGATION AND DEBRIDEMENT ABSCESS RIGHT FOOT,  COMPLICATED, DIABETIC;  Surgeon: Albertine Patricia, DPM;  Location: [facility];  Service: Podiatry;  Laterality: Right;  . LOWER EXTREMITY ANGIOGRAPHY Right 08/24/2016   Procedure: Lower Extremity Angiography;  Surgeon: Algernon Huxley, MD;  Location: [place] [facility];  Service: Cardiovascular;  Laterality: Right;  . LOWER EXTREMITY ANGIOGRAPHY Right 07/27/2018   Procedure: RIGHT Lower Extremity Angiography;  Surgeon: Algernon Huxley, MD;  Location: [place] [facility];  Service: Cardiovascular;  Laterality: Right;  . LOWER EXTREMITY ANGIOGRAPHY Right 09/09/2018   Procedure: Lower Extremity Angiography;  Surgeon: Katha Cabal, MD;  Location: [place] [facility];  Service: Cardiovascular;  Laterality: Right;  . MASS EXCISION Right 07/18/2014   Procedure: EXCISION HETEROTOPIC BONE RIGHT HIP;  Surgeon: Frederik Pear, MD;  Location: [place];  Service: Orthopedics;  Laterality: Right;  . Open  Heart Surgery  2010   x 3  . PERIPHERAL VASCULAR THROMBECTOMY Right 07/31/2019   Procedure: PERIPHERAL VASCULAR THROMBECTOMY;  Surgeon: Algernon Huxley, MD;  Location: [place] [facility];  Service: Cardiovascular;  Laterality: Right;  . PERIPHERAL VASCULAR THROMBECTOMY Right 08/17/2019   Procedure: PERIPHERAL VASCULAR THROMBECTOMY;  Surgeon: Algernon Huxley, MD;  Location: [place] [facility];  Service: Cardiovascular;  Laterality: Right;  . PERIPHERAL VASCULAR THROMBECTOMY Right 09/14/2019   Procedure: PERIPHERAL VASCULAR THROMBECTOMY;  Surgeon: Algernon Huxley, MD;  Location: [place] [facility];  Service: Cardiovascular;  Laterality: Right;  . WOUND DEBRIDEMENT Right 10/09/2016   Procedure: DEBRIDEMENT WOUND;  Surgeon: Samara Deist, DPM;  Location: [facility];  Service: Podiatry;  Laterality: Right;    Prior to Admission medications   Medication Sig Start Date End Date Taking? Authorizing Provider  allopurinol (ZYLOPRIM) 100 MG tablet Take 1 tablet (100 mg total) by mouth daily. 08/02/18   Fritzi Mandes, MD  amiodarone (PACERONE) 200 MG tablet Take 200 mg by mouth daily.    [provider]  atorvastatin (LIPITOR) 40 MG tablet Take 1 tablet (40 mg total) by mouth daily. 11/05/19   Sharen Hones, MD  calcium acetate (PHOSLO) 667 MG capsule Take 2 capsules (1,334 mg total) by mouth 3 (three) times daily with meals. 11/04/19   Sharen Hones, MD  clopidogrel (PLAVIX) 75 MG tablet Take 1 tablet (75 mg total) by mouth daily with breakfast. 07/31/19   Dew, Erskine Squibb, MD  HYDROcodone-acetaminophen (NORCO) 5-325 MG tablet Take 1 tablet by mouth every 6 (six) hours as needed for moderate pain. 10/23/19   Carrie Mew, MD  midodrine (PROAMATINE) 10 MG tablet Take 1 tablet (10 mg total) by mouth every Monday, Wednesday, and Friday with hemodialysis. 11/06/19   Sharen Hones, MD  predniSONE (DELTASONE) 10 MG tablet Take 10 mg by mouth every other day.     [provider]    Allergies Other, Baclofen,  Metformin, and Nsaids  Family History  Problem Relation Age of Onset  . Hypertension Other   . Diabetes Other   . Diabetes Father   . Hyperlipidemia Father   . Hypertension Father   . Hyperlipidemia Sister   . Hypertension Sister   . Diabetes Sister   . Diabetes Brother   . Hypertension Brother   . Hyperlipidemia Brother     Social History Social History   Tobacco Use  . Smoking status: Never Smoker  . Smokeless tobacco: Never Used  Vaping Use  . Vaping Use: Never used  Substance Use Topics  . Alcohol use: No  . Drug use: Yes    Types: Marijuana  Review of Systems Constitutional: No fever/chills, syncope Eyes: No visual changes. ENT: No sore throat. Cardiovascular: Positive chest pain Respiratory: Denies shortness of breath. Gastrointestinal: No abdominal pain.  No nausea, no vomiting.  No diarrhea.  No constipation. Genitourinary: Negative for dysuria. Musculoskeletal: Negative for back pain. Skin: Negative for rash. Neurological: Negative for headaches, focal weakness or numbness. All other ROS negative ____________________________________________   PHYSICAL EXAM:  VITAL SIGNS: ED Triage Vitals  Enc Vitals Group     BP 11/10/19 1135 (!) 144/72     Pulse Rate 11/10/19 1135 82     Resp 11/10/19 1135 17     Temp 11/10/19 1135 98.3 F (36.8 C)     Temp Source 11/10/19 1135 Oral     SpO2 11/10/19 1135 99 %     Weight 11/10/19 1135 188 lb (85.3 kg)     Height 11/10/19 1135 5\' 9"  (1.753 m)     Head Circumference --      Peak Flow --      Pain Score 11/10/19 1147 6     Pain Loc --      Pain Edu? --      Excl. in [place]? --     Constitutional: Alert and oriented. Well appearing and in no acute distress. Eyes: Conjunctivae are normal. EOMI. Head: Atraumatic. Nose: No congestion/rhinnorhea. Mouth/Throat: Mucous membranes are moist.   Neck: No stridor. Trachea Midline. FROM Cardiovascular: Normal rate, regular rhythm. Grossly normal heart sounds.   Good peripheral circulation.  Dialysis catheter in the right chest wall.  ICD Respiratory: Normal respiratory effort.  No retractions. Lungs CTAB. Gastrointestinal: Soft and nontender. No distention. No abdominal bruits.  Musculoskeletal: No lower extremity tenderness nor edema.  No joint effusions. Neurologic:  Normal speech and language. No gross focal neurologic deficits are appreciated.  Skin:  Skin is warm, dry and intact. No rash noted. Psychiatric: Mood and affect are normal. Speech and behavior are normal. GU: Deferred   ____________________________________________   LABS (all labs ordered are listed, but only abnormal results are displayed)  Labs Reviewed  CBC WITH DIFFERENTIAL/PLATELET - Abnormal; Notable for the following components:      Result Value   WBC 15.4 (*)    RBC 4.15 (*)    Neutro Abs 13.1 (*)    Abs Immature Granulocytes 0.14 (*)    All other components within normal limits  COMPREHENSIVE METABOLIC PANEL - Abnormal; Notable for the following components:   Glucose, Bld 209 (*)    BUN 48 (*)    Creatinine, Ser 9.47 (*)    Calcium 8.4 (*)    GFR calc non Af Amer 6 (*)    GFR calc Af Amer 6 (*)    All other components within normal limits  TROPONIN I (HIGH SENSITIVITY) - Abnormal; Notable for the following components:   Troponin I (High Sensitivity) 185 (*)    All other components within normal limits  TROPONIN I (HIGH SENSITIVITY) - Abnormal; Notable for the following components:   Troponin I (High Sensitivity) 150 (*)    All other components within normal limits  SARS CORONAVIRUS 2 BY RT PCR (HOSPITAL ORDER, [facility])  LIPASE, BLOOD  MAGNESIUM  PROTIME-INR  APTT   ____________________________________________   ED ECG REPORT I, Vanessa Harold, the attending physician, personally viewed and interpreted this ECG.  Atrial sensed ventricular paced rhythm at 82, no ST elevations, T wave inversions in 1, aVL, V1, V2, widened QRS  due to paced  rhythm.  T wave inversions are similar to prior ____________________________________________  RADIOLOGY  Official radiology report(s): DG Chest Portable 1 View  Result Date: 11/10/2019 CLINICAL DATA:  Chest pain. EXAM: PORTABLE CHEST 1 VIEW COMPARISON:  October 31, 2019. FINDINGS: Stable cardiomediastinal silhouette. Status post coronary bypass graft. No pneumothorax or pleural effusion is noted. Left-sided pacemaker is unchanged in position. Right internal jugular dialysis catheter is unchanged. Right lung is clear. Minimal lingular subsegmental atelectasis or scarring is noted. Bony thorax is unremarkable. IMPRESSION: Minimal lingular subsegmental atelectasis or scarring. Electronically Signed   By: Marijo Conception M.D.   On: 11/10/2019 12:50    ____________________________________________   PROCEDURES  Procedure(s) performed (including Critical Care):  .1-3 Lead EKG Interpretation Performed by: Vanessa [place], MD Authorized by: Vanessa [place], MD     Interpretation: normal     ECG rate:  80s   ECG rate assessment: normal     Rhythm: paced     Ectopy: none     Conduction: normal       ____________________________________________   INITIAL IMPRESSION / ASSESSMENT AND PLAN / ED COURSE   [name] was evaluated in Emergency Department on 11/10/2019 for the symptoms described in the history of present illness. He was evaluated in the context of the global COVID-19 pandemic, which necessitated consideration that the patient might be at risk for infection with the SARS-CoV-2 virus that causes COVID-19. Institutional protocols and algorithms that pertain to the evaluation of patients at risk for COVID-19 are in a state of rapid change based on information released by regulatory bodies including the CDC and federal and state organizations. These policies and algorithms were followed during the patient's care in the ED.    Most Likely DDx:  -Chest pain most likely  secondary to his ICD defibrillating.  Will get device interrogation.  Will get chest x-ray to evaluate lead placement and cardiac markers to evaluate for ACS.  Will need to discuss with the cardiology team and keep patient on the cardiac monitor to evaluate for arrhythmias  DDx that was also considered d/t potential to cause harm, but was found less likely based on history and physical (as detailed above): -PNA (no fevers, cough but CXR to evaluate) -PNX (reassured with equal b/l breath sounds, CXR to evaluate) -Symptomatic anemia (will get H&H) -Pulmonary embolism as no sob at rest, not pleuritic in nature, no hypoxia -Aortic Dissection as no tearing pain and no radiation to the mid back, pulses equal -Pericarditis no rub on exam, EKG changes or hx to suggest dx -Tamponade (no notable SOB, tachycardic, hypotensive) -Esophageal rupture (no h/o diffuse vomitting/no crepitus)  Device interrogation showed that patient was in V. tach.  They were unable to overdrive pace that so he was shocked.  It was a slow V. tach.  There is concerns for some shock impedance where he is too high and this could be an issue with the lead.  I discussed with Dr. Rockey Situ who discussed with Dr. Caryl Comes.  They did not recommend more amnio otherwise this could slow his V. tach to make it even harder to defibrillate him out of his V. tach's.  Patient most likely needs lead revision and they recommended patient being transferred to a facility that has EP services.  Patient is requesting to go to [facility] given that is where his cardiology EP doctor is at.  Will attempt to transfer patient here.  They are unable to take patient to [facility]  regional but have gotten patient on the wait list for [facility].   Dr. Armandina [facility] accepted and on wait list.  Unfortunately may not have a bed for 1 to 2 days.  Patient is not interested in going over to [facility].  He is willing to be admitted to our hospital until a bed  becomes available.  We would like to have him monitored in case he has another V. tach episode and his defibrillator does not work.  I discussed with the hospitalist for admission and they are agreeable to admit patient  Patient was kept on the cardiac monitor due to his history of arrhythmia he had no further episodes while in the emergency department for over 8 hours     ____________________________________________   FINAL CLINICAL IMPRESSION(S) / ED DIAGNOSES   Final diagnoses:  V tach ([place])  Chest pain, unspecified type     MEDICATIONS GIVEN DURING THIS VISIT:  Medications  midodrine (PROAMATINE) tablet 10 mg (has no administration in time range)  amiodarone (PACERONE) tablet 200 mg (has no administration in time range)  atorvastatin (LIPITOR) tablet 40 mg (has no administration in time range)  predniSONE (DELTASONE) tablet 10 mg (has no administration in time range)  calcium acetate (PHOSLO) capsule 1,334 mg (has no administration in time range)  clopidogrel (PLAVIX) tablet 75 mg (has no administration in time range)  brimonidine-timolol (COMBIGAN) 0.2-0.5 % ophthalmic solution 1 drop (has no administration in time range)  moxifloxacin (VIGAMOX) 0.5 % ophthalmic solution 1 drop (has no administration in time range)  neomycin-polymyxin b-dexamethasone (MAXITROL) ophthalmic suspension 1 drop (has no administration in time range)  heparin injection 5,000 Units (has no administration in time range)  acetaminophen (TYLENOL) tablet 650 mg (has no administration in time range)    Or  acetaminophen (TYLENOL) suppository 650 mg (has no administration in time range)  HYDROcodone-acetaminophen (NORCO/VICODIN) 5-325 MG per tablet 1-2 tablet (has no administration in time range)  morphine 2 MG/ML injection 2 mg (has no administration in time range)  ondansetron (ZOFRAN) tablet 4 mg (has no administration in time range)    Or  ondansetron (ZOFRAN) injection 4 mg (has no administration  in time range)  insulin aspart (novoLOG) injection 0-9 Units (has no administration in time range)  insulin aspart (novoLOG) injection 0-5 Units (has no administration in time range)     ED Discharge Orders    None       Note:  This document was prepared using Dragon voice recognition software and may include unintentional dictation errors.   Vanessa [place], MD 11/10/19 2004

## 2019-11-10 NOTE — ED Notes (Signed)
Snack ordered per pt request.

## 2019-11-10 NOTE — ED Notes (Signed)
[facility] transfer Center for Dr Mahala Menghini

## 2019-11-10 NOTE — ED Notes (Signed)
Attempted IV without success. Pt reports history of hard stick. Will request ultrasound IV placement.

## 2019-11-10 NOTE — ED Notes (Signed)
Informed Md Damita Dunnings of diastolic BP. Orders to recheck in 75minutes if pt remains unsymptomatic.

## 2019-11-10 NOTE — ED Notes (Signed)
[employer] called at this time [phone])- Service requested.

## 2019-11-10 NOTE — ED Notes (Signed)
Pt given additional blanket and assisted to toilet. Pt requests privacy while toileting. Call string explained to pt.

## 2019-11-11 DIAGNOSIS — Z95810 Presence of automatic (implantable) cardiac defibrillator: Secondary | ICD-10-CM | POA: Insufficient documentation

## 2019-11-11 DIAGNOSIS — I255 Ischemic cardiomyopathy: Secondary | ICD-10-CM | POA: Diagnosis present

## 2019-11-11 DIAGNOSIS — I480 Paroxysmal atrial fibrillation: Secondary | ICD-10-CM | POA: Diagnosis present

## 2019-11-11 DIAGNOSIS — E1122 Type 2 diabetes mellitus with diabetic chronic kidney disease: Secondary | ICD-10-CM | POA: Diagnosis not present

## 2019-11-11 DIAGNOSIS — L98499 Non-pressure chronic ulcer of skin of other sites with unspecified severity: Secondary | ICD-10-CM | POA: Diagnosis not present

## 2019-11-11 DIAGNOSIS — I953 Hypotension of hemodialysis: Secondary | ICD-10-CM | POA: Diagnosis not present

## 2019-11-11 DIAGNOSIS — I132 Hypertensive heart and chronic kidney disease with heart failure and with stage 5 chronic kidney disease, or end stage renal disease: Secondary | ICD-10-CM | POA: Diagnosis not present

## 2019-11-11 DIAGNOSIS — H2589 Other age-related cataract: Secondary | ICD-10-CM | POA: Diagnosis present

## 2019-11-11 DIAGNOSIS — N186 End stage renal disease: Secondary | ICD-10-CM

## 2019-11-11 DIAGNOSIS — Z992 Dependence on renal dialysis: Secondary | ICD-10-CM | POA: Diagnosis not present

## 2019-11-11 DIAGNOSIS — G4730 Sleep apnea, unspecified: Secondary | ICD-10-CM | POA: Diagnosis present

## 2019-11-11 DIAGNOSIS — R0789 Other chest pain: Secondary | ICD-10-CM | POA: Diagnosis not present

## 2019-11-11 DIAGNOSIS — I472 Ventricular tachycardia: Secondary | ICD-10-CM | POA: Diagnosis not present

## 2019-11-11 DIAGNOSIS — I959 Hypotension, unspecified: Secondary | ICD-10-CM

## 2019-11-11 DIAGNOSIS — I6523 Occlusion and stenosis of bilateral carotid arteries: Secondary | ICD-10-CM | POA: Diagnosis present

## 2019-11-11 DIAGNOSIS — Z95820 Peripheral vascular angioplasty status with implants and grafts: Secondary | ICD-10-CM | POA: Diagnosis not present

## 2019-11-11 DIAGNOSIS — E11622 Type 2 diabetes mellitus with other skin ulcer: Secondary | ICD-10-CM | POA: Diagnosis present

## 2019-11-11 DIAGNOSIS — I2510 Atherosclerotic heart disease of native coronary artery without angina pectoris: Secondary | ICD-10-CM | POA: Diagnosis present

## 2019-11-11 DIAGNOSIS — E1152 Type 2 diabetes mellitus with diabetic peripheral angiopathy with gangrene: Secondary | ICD-10-CM | POA: Diagnosis not present

## 2019-11-11 DIAGNOSIS — I96 Gangrene, not elsewhere classified: Secondary | ICD-10-CM

## 2019-11-11 DIAGNOSIS — L97829 Non-pressure chronic ulcer of other part of left lower leg with unspecified severity: Secondary | ICD-10-CM | POA: Diagnosis not present

## 2019-11-11 DIAGNOSIS — Z4502 Encounter for adjustment and management of automatic implantable cardiac defibrillator: Secondary | ICD-10-CM

## 2019-11-11 DIAGNOSIS — M109 Gout, unspecified: Secondary | ICD-10-CM | POA: Diagnosis present

## 2019-11-11 DIAGNOSIS — Z20822 Contact with and (suspected) exposure to covid-19: Secondary | ICD-10-CM | POA: Diagnosis not present

## 2019-11-11 DIAGNOSIS — I5022 Chronic systolic (congestive) heart failure: Secondary | ICD-10-CM | POA: Diagnosis not present

## 2019-11-11 DIAGNOSIS — R079 Chest pain, unspecified: Secondary | ICD-10-CM | POA: Diagnosis not present

## 2019-11-11 DIAGNOSIS — E1136 Type 2 diabetes mellitus with diabetic cataract: Secondary | ICD-10-CM | POA: Diagnosis present

## 2019-11-11 DIAGNOSIS — Z951 Presence of aortocoronary bypass graft: Secondary | ICD-10-CM | POA: Diagnosis not present

## 2019-11-11 DIAGNOSIS — Z89511 Acquired absence of right leg below knee: Secondary | ICD-10-CM | POA: Diagnosis not present

## 2019-11-11 LAB — BASIC METABOLIC PANEL
Anion gap: 14 (ref 5–15)
BUN: 66 mg/dL — ABNORMAL HIGH (ref 6–20)
CO2: 22 mmol/L (ref 22–32)
Calcium: 8.2 mg/dL — ABNORMAL LOW (ref 8.9–10.3)
Chloride: 102 mmol/L (ref 98–111)
Creatinine, Ser: 11.68 mg/dL — ABNORMAL HIGH (ref 0.61–1.24)
GFR calc Af Amer: 5 mL/min — ABNORMAL LOW (ref >60)
GFR calc non Af Amer: 4 mL/min — ABNORMAL LOW (ref >60)
Glucose, Bld: 103 mg/dL — ABNORMAL HIGH (ref 70–99)
Potassium: 4.8 mmol/L (ref 3.5–5.1)
Sodium: 138 mmol/L (ref 135–145)

## 2019-11-11 LAB — SARS CORONAVIRUS 2 (TAT 6-24 HRS): SARS Coronavirus 2: NEGATIVE

## 2019-11-11 LAB — GLUCOSE, CAPILLARY
Glucose-Capillary: 118 mg/dL — ABNORMAL HIGH (ref 70–99)
Glucose-Capillary: 164 mg/dL — ABNORMAL HIGH (ref 70–99)

## 2019-11-11 LAB — MAGNESIUM: Magnesium: 2.4 mg/dL (ref 1.7–2.4)

## 2019-11-11 MED ORDER — BRIMONIDINE TARTRATE 0.2 % OP SOLN
1.0000 [drp] | Freq: Four times a day (QID) | OPHTHALMIC | Status: DC
  Administered 2019-11-11 (×4): 1 [drp] via OPHTHALMIC
  Filled 2019-11-11: qty 5

## 2019-11-11 MED ORDER — MIDODRINE HCL 5 MG PO TABS
10.0000 mg | ORAL_TABLET | Freq: Once | ORAL | Status: DC
  Filled 2019-11-11: qty 2

## 2019-11-11 MED ORDER — PREDNISONE 10 MG PO TABS
10.0000 mg | ORAL_TABLET | ORAL | Status: DC
  Administered 2019-11-11: 10 mg via ORAL
  Filled 2019-11-11: qty 1

## 2019-11-11 MED ORDER — TIMOLOL MALEATE 0.5 % OP SOLN
1.0000 [drp] | Freq: Four times a day (QID) | OPHTHALMIC | Status: DC
  Administered 2019-11-11 (×4): 1 [drp] via OPHTHALMIC
  Filled 2019-11-11: qty 5

## 2019-11-11 NOTE — ED Notes (Signed)
Called lab to collect morning labs

## 2019-11-11 NOTE — ED Notes (Addendum)
Pt states "I can't do a heart health diet. Either I get the diet changed or I'm ordering [employer]." Pt educated on reason for diet modification, pt continues to refuse meal plan. Requested heart healthy diet removed for carb modified only from [facility] MD. Awaiting response.

## 2019-11-11 NOTE — ED Notes (Signed)
Housekeeping at bedside cleaning up spilt coffee

## 2019-11-11 NOTE — ED Notes (Signed)
breakfast tray received and pt ate 99%   lw edt

## 2019-11-11 NOTE — ED Notes (Signed)
Pt refused lunch, refuses heart health diet ordered meal.

## 2019-11-11 NOTE — ED Notes (Signed)
Per ED tech, pt refuses BGL check until his diet is changed to carb modified- will notify MD

## 2019-11-11 NOTE — Progress Notes (Signed)
Patient transferred to rm 250. VSS, no complaints of chest pain. Call bell within reach, bed alarm on.

## 2019-11-11 NOTE — Progress Notes (Signed)
Patient ID: Paul Ala., male   DOB: [birth year], 54 y.o.   MRN: [id number] Paul Hills Dieudonne Jr. HWE:[id number] DOB: 02/21/1966 DOA: 11/10/2019 PCP: Paul Harrier, MD  HPI/Subjective: Patient came in after his defibrillator went off.  Currently now feels okay.  Patient concerned about his fourth finger being gangrenous for about 3 weeks.  We will get a set him up with an outpatient TEE on Monday.  He went in for Covid test and passed out and his defibrillator went off.  Does complain of a little pain in his left jaw.  Objective: Vitals:   11/11/19 1129 11/11/19 1139  BP: 135/71   Pulse:    Resp: 14   Temp:    SpO2:  100%   No intake or output data in the 24 hours ending 11/11/19 1410 Filed Weights   11/10/19 1135  Weight: 85.3 kg    ROS: Review of Systems  Respiratory: Negative for shortness of breath.   Cardiovascular: Negative for chest pain.  Gastrointestinal: Negative for abdominal pain.   Exam: Physical Exam HENT:     Nose: No mucosal edema.     Mouth/Throat:     Pharynx: No oropharyngeal exudate.  Eyes:     General: Lids are normal.     Conjunctiva/sclera: Conjunctivae normal.     Pupils: Pupils are equal, round, and reactive to light.  Cardiovascular:     Rate and Rhythm: Normal rate.     Pulses:          Radial pulses are 0 on the right side.     Heart sounds: S1 normal and S2 normal. No murmur heard.   Pulmonary:     Effort: No respiratory distress.     Breath sounds: No wheezing, rhonchi or rales.  Abdominal:     Palpations: Abdomen is soft.     Tenderness: There is no abdominal tenderness.  Musculoskeletal:     Right ankle: No swelling.     Left ankle: No swelling.  Skin:    General: Skin is warm.     Nails: There is no clubbing.     Comments: Small healing ulcer left shin.  No ulcer on left heel. Right fourth fingertip gangrenous dry.  Ulcer with black discoloration on right thumb.  Neurological:     Mental  Status: He is alert.     Cranial Nerves: No cranial nerve deficit.       Data Reviewed: Basic Metabolic Panel: Recent Labs  Lab 11/10/19 1219 11/11/19 1021  NA 135 138  K 3.9 4.8  CL 98 102  CO2 23 22  GLUCOSE 209* 103*  BUN 48* 66*  CREATININE 9.47* 11.68*  CALCIUM 8.4* 8.2*  MG 2.2 2.4   Liver Function Tests: Recent Labs  Lab 11/10/19 1219  AST 22  ALT 25  ALKPHOS 94  BILITOT 0.6  PROT 7.6  ALBUMIN 3.8   Recent Labs  Lab 11/10/19 1219  LIPASE 50   CBC: Recent Labs  Lab 11/10/19 1219  WBC 15.4*  NEUTROABS 13.1*  HGB 13.0  HCT 40.5  MCV 97.6  PLT 336    CBG: Recent Labs  Lab 11/08/19 0644 11/10/19 2117 11/11/19 0731  GLUCAP 98 205* 118*    Recent Results (from the past 240 hour(s))  CULTURE, BLOOD (ROUTINE X 2) w Reflex to ID Panel     Status: None   Collection Time: 11/02/19 10:14 AM   Specimen: BLOOD  Result Value Ref Range  Status   Specimen Description BLOOD LEFT ANTECUBITAL  Final   Special Requests   Final    BOTTLES DRAWN AEROBIC AND ANAEROBIC Blood Culture adequate volume   Culture   Final    NO GROWTH 5 DAYS Performed at [facility], [address] [place], [place] [address]    Report Status 11/07/2019 FINAL  Final  CULTURE, BLOOD (ROUTINE X 2) w Reflex to ID Panel     Status: None   Collection Time: 11/02/19  2:42 PM   Specimen: BLOOD  Result Value Ref Range Status   Specimen Description BLOOD BLOOD LEFT ARM  Final   Special Requests   Final    BOTTLES DRAWN AEROBIC AND ANAEROBIC Blood Culture adequate volume   Culture   Final    NO GROWTH 5 DAYS Performed at [facility], [address] [place], [place] [address]    Report Status 11/07/2019 FINAL  Final  SARS Coronavirus 2 by RT PCR (hospital order, performed in [facility] hospital lab) Nasopharyngeal Nasopharyngeal Swab     Status: None   Collection Time: 11/10/19 12:27 PM   Specimen: Nasopharyngeal Swab  Result Value Ref Range Status   SARS  Coronavirus 2 NEGATIVE NEGATIVE Final    Comment: (NOTE) SARS-CoV-2 target nucleic acids are NOT DETECTED.  The SARS-CoV-2 RNA is generally detectable in upper and lower respiratory specimens during the acute phase of infection. The lowest concentration of SARS-CoV-2 viral copies this assay can detect is 250 copies / mL. A negative result does not preclude SARS-CoV-2 infection and should not be used as the sole basis for treatment or other patient management decisions.  A negative result may occur with improper specimen collection / handling, submission of specimen other than nasopharyngeal swab, presence of viral mutation(s) within the areas targeted by this assay, and inadequate number of viral copies (<250 copies / mL). A negative result must be combined with clinical observations, patient history, and epidemiological information.  Fact Sheet for Patients:   StrictlyIdeas.no  Fact Sheet for Healthcare Providers: BankingDealers.co.za  This test is not yet approved or  cleared by the [place] FDA and has been authorized for detection and/or diagnosis of SARS-CoV-2 by FDA under an Emergency Use Authorization (EUA).  This EUA will remain in effect (meaning this test can be used) for the duration of the COVID-19 declaration under Section 564(b)(1) of the Act, 21 U.S.C. section 360bbb-3(b)(1), unless the authorization is terminated or revoked sooner.  Performed at [facility], [place], [place] [address]   SARS CORONAVIRUS 2 (TAT 6-24 HRS) Nasopharyngeal Nasopharyngeal Swab     Status: None   Collection Time: 11/10/19  2:26 PM   Specimen: Nasopharyngeal Swab  Result Value Ref Range Status   SARS Coronavirus 2 NEGATIVE NEGATIVE Final    Comment: (NOTE) SARS-CoV-2 target nucleic acids are NOT DETECTED.  The SARS-CoV-2 RNA is generally detectable in upper and lower respiratory specimens during the acute  phase of infection. Negative results do not preclude SARS-CoV-2 infection, do not rule out co-infections with other pathogens, and should not be used as the sole basis for treatment or other patient management decisions. Negative results must be combined with clinical observations, patient history, and epidemiological information. The expected result is Negative.  Fact Sheet for Patients: SugarRoll.be  Fact Sheet for Healthcare Providers: https://www.woods-mathews.com/  This test is not yet approved or cleared by the [place] FDA and  has been authorized for detection and/or diagnosis of SARS-CoV-2 by  FDA under an Emergency Use Authorization (EUA). This EUA will remain  in effect (meaning this test can be used) for the duration of the COVID-19 declaration under Se ction 564(b)(1) of the Act, 21 U.S.C. section 360bbb-3(b)(1), unless the authorization is terminated or revoked sooner.  Performed at [facility], [place] [address], [place], [place] [address]      Studies: DG Chest Portable 1 View  Result Date: 11/10/2019 CLINICAL DATA:  Chest pain. EXAM: PORTABLE CHEST 1 VIEW COMPARISON:  October 31, 2019. FINDINGS: Stable cardiomediastinal silhouette. Status post coronary bypass graft. No pneumothorax or pleural effusion is noted. Left-sided pacemaker is unchanged in position. Right internal jugular dialysis catheter is unchanged. Right lung is clear. Minimal lingular subsegmental atelectasis or scarring is noted. Bony thorax is unremarkable. IMPRESSION: Minimal lingular subsegmental atelectasis or scarring. Electronically Signed   By: Marijo Conception M.D.   On: 11/10/2019 12:50    Scheduled Meds: . amiodarone  200 mg Oral Daily  . atorvastatin  40 mg Oral Daily  . brimonidine  1 drop Right Eye QID   And  . timolol  1 drop Right Eye QID  . calcium acetate  1,334 mg Oral TID WC  . clopidogrel  75 mg Oral Q breakfast  . heparin  5,000  Units Subcutaneous Q8H  . [START ON 11/13/2019] midodrine  10 mg Oral Q M,W,F-HD  . midodrine  10 mg Oral Once  . moxifloxacin  1 drop Right Eye QID  . neomycin-polymyxin b-dexamethasone  1 drop Right Eye Q6H  . oxyCODONE  5 mg Oral Once  . predniSONE  10 mg Oral QODAY    Assessment/Plan:  1. Ventricular tachycardia status post AICD firing and shock.  Patient on amiodarone.  Cardiology discussed with EP specialist and recommended transfer.  The patient wanted to go to [place] called over there and currently on wait list pending bed availability. 2. Right fourth finger gangrenous with an ulcer on the thumb.  Patient did have an AV fistula on the right side which could be the cause of his issues. 3. Paroxysmal atrial fibrillation patient only on Plavix.  They were planning on doing a TEE on Monday as outpatient. 4. Chronic systolic congestive heart failure.  Last EF 25 to 30%.  Limited with medications secondary to relative hypotension and on midodrine. 5. End-stage renal disease on dialysis to manage fluid 6. Hypotension on midodrine 7. Ulcer left shin.  This is healing. 8. 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   Code Status:     Code Status Orders  (From admission, onward)         Start     Ordered   11/10/19 1935  Full code  Continuous        11/10/19 1937        Code Status History    Date Active Date Inactive Code Status Order ID Comments User Context   10/31/2019 2142 11/04/2019 2042 Full Code [id number]  Lenore Cordia, MD ED   12/21/2018 2024 01/03/2019 2016 Full Code [id number]  Dustin Flock, MD Inpatient   10/04/2018 1958 10/11/2018 1801 Full Code [id number]  Sela Hua, MD Inpatient   09/06/2018 0259 09/12/2018 2017 Full Code [id number]  Lance Coon, MD Inpatient   07/25/2018 1717 08/02/2018 1919 Full Code [id number]  Dustin Flock, MD Inpatient   05/25/2018 0352 05/29/2018 2022 Full Code [id number]  Hillary Bow, MD ED   10/09/2016 1003 10/09/2016 1340  Full Code [id number]  Samara Deist, [facility] Inpatient   08/22/2016 0042 08/27/2016 1649 Full Code [id number]  Lance Coon, MD Inpatient   07/18/2014 1818 07/20/2014 2002 Full Code [id number]  [facility] Inpatient   Advance Care Planning Activity     Disposition Plan: Status is: Inpatient  Dispo: The patient is from: Home              Anticipated d/c is to: [place] once bed available              Anticipated d/c date is: Depending on bed availability at [facility]              Patient currently being followed for ventricular tachycardia and defibrillator firing.  Also has gangrene of the right fourth finger and vascular surgery consulted here.  Consultants:  Cardiology  Vascular surgery  Nephrology  Time spent: 28 minutes  [place]

## 2019-11-11 NOTE — Progress Notes (Signed)
[facility]:  Chart reviewed and case discussed with Dr. Rockey Situ. He was admitted following ICD shock. He has refused transfer to our EP at [facility]. The patient is awaiting transfer to [facility] to the EP team to discuss ICD lead revision.   Cardiology available if needed this weekend here at [facility].   Lauree Chandler 11/11/2019 11:33 AM

## 2019-11-11 NOTE — ED Notes (Signed)
Patient given menu to order lunch

## 2019-11-11 NOTE — ED Notes (Signed)
NP has been notified about BP. Orders placed, see MAR. Awaiting medication verification from pharmacy.

## 2019-11-11 NOTE — ED Notes (Signed)
Requested carb modified diet of [place], MD per pt request

## 2019-11-12 DIAGNOSIS — R000 Tachycardia, unspecified: Secondary | ICD-10-CM | POA: Diagnosis not present

## 2019-11-12 DIAGNOSIS — I2510 Atherosclerotic heart disease of native coronary artery without angina pectoris: Secondary | ICD-10-CM | POA: Diagnosis not present

## 2019-11-12 DIAGNOSIS — R0602 Shortness of breath: Secondary | ICD-10-CM | POA: Diagnosis not present

## 2019-11-12 DIAGNOSIS — I472 Ventricular tachycardia: Secondary | ICD-10-CM | POA: Diagnosis not present

## 2019-11-12 DIAGNOSIS — I498 Other specified cardiac arrhythmias: Secondary | ICD-10-CM | POA: Diagnosis not present

## 2019-11-12 DIAGNOSIS — Z992 Dependence on renal dialysis: Secondary | ICD-10-CM | POA: Diagnosis not present

## 2019-11-12 DIAGNOSIS — E875 Hyperkalemia: Secondary | ICD-10-CM | POA: Diagnosis not present

## 2019-11-12 DIAGNOSIS — Z95810 Presence of automatic (implantable) cardiac defibrillator: Secondary | ICD-10-CM | POA: Diagnosis not present

## 2019-11-12 DIAGNOSIS — I70228 Atherosclerosis of native arteries of extremities with rest pain, other extremity: Secondary | ICD-10-CM | POA: Diagnosis not present

## 2019-11-12 DIAGNOSIS — N183 Chronic kidney disease, stage 3 unspecified: Secondary | ICD-10-CM | POA: Diagnosis not present

## 2019-11-12 DIAGNOSIS — M5136 Other intervertebral disc degeneration, lumbar region: Secondary | ICD-10-CM | POA: Diagnosis not present

## 2019-11-12 DIAGNOSIS — I70268 Atherosclerosis of native arteries of extremities with gangrene, other extremity: Secondary | ICD-10-CM | POA: Diagnosis not present

## 2019-11-12 DIAGNOSIS — E871 Hypo-osmolality and hyponatremia: Secondary | ICD-10-CM | POA: Diagnosis not present

## 2019-11-12 DIAGNOSIS — N2581 Secondary hyperparathyroidism of renal origin: Secondary | ICD-10-CM | POA: Diagnosis not present

## 2019-11-12 DIAGNOSIS — I4891 Unspecified atrial fibrillation: Secondary | ICD-10-CM | POA: Diagnosis not present

## 2019-11-12 DIAGNOSIS — T82828A Fibrosis of vascular prosthetic devices, implants and grafts, initial encounter: Secondary | ICD-10-CM | POA: Diagnosis not present

## 2019-11-12 DIAGNOSIS — Z20822 Contact with and (suspected) exposure to covid-19: Secondary | ICD-10-CM | POA: Diagnosis not present

## 2019-11-12 DIAGNOSIS — I4510 Unspecified right bundle-branch block: Secondary | ICD-10-CM | POA: Diagnosis not present

## 2019-11-12 DIAGNOSIS — T8241XA Breakdown (mechanical) of vascular dialysis catheter, initial encounter: Secondary | ICD-10-CM | POA: Diagnosis not present

## 2019-11-12 DIAGNOSIS — I742 Embolism and thrombosis of arteries of the upper extremities: Secondary | ICD-10-CM | POA: Diagnosis not present

## 2019-11-12 DIAGNOSIS — I493 Ventricular premature depolarization: Secondary | ICD-10-CM | POA: Diagnosis not present

## 2019-11-12 DIAGNOSIS — Z4502 Encounter for adjustment and management of automatic implantable cardiac defibrillator: Secondary | ICD-10-CM | POA: Diagnosis not present

## 2019-11-12 DIAGNOSIS — T82897A Other specified complication of cardiac prosthetic devices, implants and grafts, initial encounter: Secondary | ICD-10-CM | POA: Diagnosis not present

## 2019-11-12 DIAGNOSIS — M79641 Pain in right hand: Secondary | ICD-10-CM | POA: Diagnosis not present

## 2019-11-12 DIAGNOSIS — N184 Chronic kidney disease, stage 4 (severe): Secondary | ICD-10-CM | POA: Diagnosis not present

## 2019-11-12 DIAGNOSIS — R279 Unspecified lack of coordination: Secondary | ICD-10-CM | POA: Diagnosis not present

## 2019-11-12 DIAGNOSIS — D631 Anemia in chronic kidney disease: Secondary | ICD-10-CM | POA: Diagnosis not present

## 2019-11-12 DIAGNOSIS — Z89511 Acquired absence of right leg below knee: Secondary | ICD-10-CM | POA: Insufficient documentation

## 2019-11-12 DIAGNOSIS — M6281 Muscle weakness (generalized): Secondary | ICD-10-CM | POA: Diagnosis not present

## 2019-11-12 DIAGNOSIS — I739 Peripheral vascular disease, unspecified: Secondary | ICD-10-CM | POA: Diagnosis not present

## 2019-11-12 DIAGNOSIS — I959 Hypotension, unspecified: Secondary | ICD-10-CM | POA: Diagnosis not present

## 2019-11-12 DIAGNOSIS — I70208 Unspecified atherosclerosis of native arteries of extremities, other extremity: Secondary | ICD-10-CM | POA: Diagnosis not present

## 2019-11-12 DIAGNOSIS — I5020 Unspecified systolic (congestive) heart failure: Secondary | ICD-10-CM | POA: Diagnosis not present

## 2019-11-12 DIAGNOSIS — R600 Localized edema: Secondary | ICD-10-CM | POA: Diagnosis not present

## 2019-11-12 DIAGNOSIS — R5381 Other malaise: Secondary | ICD-10-CM | POA: Diagnosis not present

## 2019-11-12 DIAGNOSIS — I953 Hypotension of hemodialysis: Secondary | ICD-10-CM | POA: Diagnosis not present

## 2019-11-12 DIAGNOSIS — Z951 Presence of aortocoronary bypass graft: Secondary | ICD-10-CM | POA: Diagnosis not present

## 2019-11-12 DIAGNOSIS — I5022 Chronic systolic (congestive) heart failure: Secondary | ICD-10-CM | POA: Diagnosis not present

## 2019-11-12 DIAGNOSIS — M86241 Subacute osteomyelitis, right hand: Secondary | ICD-10-CM | POA: Diagnosis not present

## 2019-11-12 DIAGNOSIS — Z89021 Acquired absence of right finger(s): Secondary | ICD-10-CM | POA: Diagnosis not present

## 2019-11-12 DIAGNOSIS — I132 Hypertensive heart and chronic kidney disease with heart failure and with stage 5 chronic kidney disease, or end stage renal disease: Secondary | ICD-10-CM | POA: Diagnosis not present

## 2019-11-12 DIAGNOSIS — I96 Gangrene, not elsewhere classified: Secondary | ICD-10-CM | POA: Diagnosis not present

## 2019-11-12 DIAGNOSIS — E1152 Type 2 diabetes mellitus with diabetic peripheral angiopathy with gangrene: Secondary | ICD-10-CM | POA: Diagnosis not present

## 2019-11-12 DIAGNOSIS — R918 Other nonspecific abnormal finding of lung field: Secondary | ICD-10-CM | POA: Diagnosis not present

## 2019-11-12 DIAGNOSIS — S88111A Complete traumatic amputation at level between knee and ankle, right lower leg, initial encounter: Secondary | ICD-10-CM | POA: Diagnosis not present

## 2019-11-12 DIAGNOSIS — J810 Acute pulmonary edema: Secondary | ICD-10-CM | POA: Diagnosis not present

## 2019-11-12 DIAGNOSIS — Z743 Need for continuous supervision: Secondary | ICD-10-CM | POA: Diagnosis not present

## 2019-11-12 DIAGNOSIS — I679 Cerebrovascular disease, unspecified: Secondary | ICD-10-CM | POA: Diagnosis not present

## 2019-11-12 DIAGNOSIS — E1143 Type 2 diabetes mellitus with diabetic autonomic (poly)neuropathy: Secondary | ICD-10-CM | POA: Diagnosis not present

## 2019-11-12 DIAGNOSIS — I4892 Unspecified atrial flutter: Secondary | ICD-10-CM | POA: Diagnosis not present

## 2019-11-12 DIAGNOSIS — T82897D Other specified complication of cardiac prosthetic devices, implants and grafts, subsequent encounter: Secondary | ICD-10-CM | POA: Diagnosis not present

## 2019-11-12 DIAGNOSIS — Z950 Presence of cardiac pacemaker: Secondary | ICD-10-CM | POA: Diagnosis not present

## 2019-11-12 DIAGNOSIS — R55 Syncope and collapse: Secondary | ICD-10-CM | POA: Diagnosis not present

## 2019-11-12 DIAGNOSIS — I25810 Atherosclerosis of coronary artery bypass graft(s) without angina pectoris: Secondary | ICD-10-CM | POA: Diagnosis not present

## 2019-11-12 DIAGNOSIS — I4901 Ventricular fibrillation: Secondary | ICD-10-CM | POA: Diagnosis not present

## 2019-11-12 DIAGNOSIS — I5042 Chronic combined systolic (congestive) and diastolic (congestive) heart failure: Secondary | ICD-10-CM | POA: Diagnosis not present

## 2019-11-12 DIAGNOSIS — N186 End stage renal disease: Secondary | ICD-10-CM | POA: Diagnosis not present

## 2019-11-12 DIAGNOSIS — T82198A Other mechanical complication of other cardiac electronic device, initial encounter: Secondary | ICD-10-CM | POA: Diagnosis not present

## 2019-11-12 DIAGNOSIS — I255 Ischemic cardiomyopathy: Secondary | ICD-10-CM | POA: Diagnosis not present

## 2019-11-12 DIAGNOSIS — E1122 Type 2 diabetes mellitus with diabetic chronic kidney disease: Secondary | ICD-10-CM | POA: Diagnosis not present

## 2019-11-12 DIAGNOSIS — N189 Chronic kidney disease, unspecified: Secondary | ICD-10-CM | POA: Diagnosis not present

## 2019-11-12 DIAGNOSIS — I120 Hypertensive chronic kidney disease with stage 5 chronic kidney disease or end stage renal disease: Secondary | ICD-10-CM | POA: Diagnosis not present

## 2019-11-12 NOTE — Discharge Summary (Signed)
[place] [facility] [place] NAME: Sanjeev Main    MR#:  [id number]  DATE OF BIRTH:  55-29-1967  DATE OF ADMISSION:  11/10/2019 ADMITTING PHYSICIAN: Loletha Grayer, MD  DATE OF DISCHARGE: 11/11/2019 11:05 PM  PRIMARY CARE PHYSICIAN: Tracie Harrier, MD    ADMISSION DIAGNOSIS:  Ventricular tachycardia ([place]) [I47.2] V tach ([place]) [I47.2] Chest pain, unspecified type [R07.9] Inappropriate discharge of implantable cardioverter-defibrillator (ICD), initial encounter [T82.198A]  DISCHARGE DIAGNOSIS:  Principal Problem:   Implantable cardioverter-defibrillator (ICD) discharge Active Problems:   Gangrene of finger of right hand ([place])   Diabetes ([place])   CAD (coronary artery disease)   Chronic systolic CHF (congestive heart failure) ([place])   Below-knee amputation of right lower extremity ([place])   ESRD on dialysis ([place])   Syncope   PAF (paroxysmal atrial fibrillation) (HCC)   Elevated troponin   Ventricular tachycardia (HCC)   Hypotension   SECONDARY DIAGNOSIS:   Past Medical History:  Diagnosis Date   AICD (automatic cardioverter/defibrillator) present 2011   Anemia    Cardiac defibrillator in place    a. Biotronik LUmax 540 DRT, (ser # [id number]).   Carotid arterial disease ([place])    a. s/p prior LICA stenting;  b. 06/9935 Carotid U/S: 40-59% bilat ICA stenosis. Patent LICA stent.   CHF (congestive heart failure) (HCC)    CKD (chronic kidney disease), stage III    Coronary artery disease    a. 2010 s/p CABG x 3.   DDD (degenerative disc disease), lumbosacral    L5-S1   Diabetes ([place])    Lantus at bedtime   Dialysis patient [facility])    M,W,F   Employs prosthetic leg    Right BKA   Gangrene of toe of right foot ([place])    Gout of left hand 10/06/2016   HFrEF (heart failure with reduced ejection fraction) ([place])    a. 07/2016 Echo: EF 25-30%, diff HK, mild MR, mildly dil LA, mod reduced RV fxn, PASP 61mmHg.   Hypertension    takes  Coreg daily   Ischemic cardiomyopathy    a. 07/2016 Echo: EF 25-30%, diff HK.   Myocardial infarction [facility]) 2009   Peripheral vascular disease ([place])    Sleep apnea    sleep study yr ago-unable to afford cpap   Stroke ([place]) 09   no weakness    HOSPITAL COURSE:   1. Ventricular tachycardia status post AICD firing and shock. The patient is on amiodarone. Cardiology discussed case with EP specialist and recommended transfer. The patient wanted to go to [facility]. I felt the wait was going to be longer than what it was but the patient ended up being discharged to [facility] on 11/11/2019 before midnight. 2. Right 4th finger gangrenous with ulcer also on the right thumb. The patient did have an AV fistula on the right side which could be the cause of his issues. I did consult vascular surgery but the patient was transferred to [facility]. The patient has seen Dr. Lucky Cowboy vascular surgery. They were planning on doing a TEE. The patient states that he has had procedures on that fistula to remove clots. 3. Paroxysmal atrial fibrillation. Patient only on Plavix at this point. 4. Chronic systolic congestive heart failure. Last EF 25 to 30%. Limited with medication secondary to relative hypotension. Patient on midodrine. 5. End-stage renal disease on dialysis to manage fluid 6. Hypotension on midodrine 7. Ulcer left shin. This is healing 8. Status post right BKA in the  past  DISCHARGE CONDITIONS:   Fair  CONSULTS OBTAINED:  Treatment Team:  Minna Merritts, MD  DRUG ALLERGIES:   Allergies  Allergen Reactions   Other Other (See Comments)    Cardiac Problems. Pt states he tolerates Toradol. Due to kidney and heart problems per pt   Baclofen Other (See Comments)    vertigo   Metformin Diarrhea   Nsaids     Due to kidney and heart problems per pt. Pt states he tolerates Toradol    DISCHARGE MEDICATIONS:   Amiodarone 200 mg p.o. daily Lipitor 40 mg p.o. daily Combigan  0.2/0.51 drop right eye in the morning, at noon and in the evening at bedtime PhosLo 6 six 7 capsule. 2 capsules 3 times a day with meals Plavix 75 mg p.o. daily Vigamox 0.5 ophthalmic solution 1 drop right eye in the morning, at noon, in the evening and at bedtime Maxitrol 1 drop right eye every 6 hours Prednisone 10 mg every other day Midodrine 10 mg every Monday Wednesday and Friday with hemodialysis Allopurinol 100 mg daily p.o.  DISCHARGE INSTRUCTIONS:   Patient to follow-up at [facility] with EP specialist. Will also need nephrology and vascular surgery consultations.  If you experience worsening of your admission symptoms, develop shortness of breath, life threatening emergency, suicidal or homicidal thoughts you must seek medical attention immediately by calling 911 or calling your MD immediately  if symptoms less severe.  You Must read complete instructions/literature along with all the possible adverse reactions/side effects for all the Medicines you take and that have been prescribed to you. Take any new Medicines after you have completely understood and accept all the possible adverse reactions/side effects.   Please note  You were cared for by a hospitalist during your hospital stay. If you have any questions about your discharge medications or the care you received while you were in the hospital after you are discharged, you can call the unit and asked to speak with the hospitalist on call if the hospitalist that took care of you is not available. Once you are discharged, your primary care physician will handle any further medical issues. Please note that NO REFILLS for any discharge medications will be authorized once you are discharged, as it is imperative that you return to your primary care physician (or establish a relationship with a primary care physician if you do not have one) for your aftercare needs so that they can reassess your need for medications and monitor your lab  values.    Today   CHIEF COMPLAINT:   Chief Complaint  Patient presents with   Chest Pain    HISTORY OF PRESENT ILLNESS:  Estevon Fluke  is a 54 y.o. male came in with AICD firing and passing out episode   VITAL SIGNS:  Blood pressure (!) 107/48, pulse 80, temperature 98.3 F (36.8 C), resp. rate 20, height 5\' 9"  (1.753 m), weight 87.1 kg, SpO2 97 %.   DATA REVIEW:   CBC Recent Labs  Lab 11/10/19 1219  WBC 15.4*  HGB 13.0  HCT 40.5  PLT 336    Chemistries  Recent Labs  Lab 11/10/19 1219 11/10/19 1219 11/11/19 1021  NA 135   < > 138  K 3.9   < > 4.8  CL 98   < > 102  CO2 23   < > 22  GLUCOSE 209*   < > 103*  BUN 48*   < > 66*  CREATININE 9.47*   < >  11.68*  CALCIUM 8.4*   < > 8.2*  MG 2.2   < > 2.4  AST 22  --   --   ALT 25  --   --   ALKPHOS 94  --   --   BILITOT 0.6  --   --    < > = values in this interval not displayed.    Microbiology Results  Results for orders placed or performed during the hospital encounter of 11/10/19  SARS Coronavirus 2 by RT PCR (hospital order, performed in [facility] hospital lab) Nasopharyngeal Nasopharyngeal Swab     Status: None   Collection Time: 11/10/19 12:27 PM   Specimen: Nasopharyngeal Swab  Result Value Ref Range Status   SARS Coronavirus 2 NEGATIVE NEGATIVE Final    Comment: (NOTE) SARS-CoV-2 target nucleic acids are NOT DETECTED.  The SARS-CoV-2 RNA is generally detectable in upper and lower respiratory specimens during the acute phase of infection. The lowest concentration of SARS-CoV-2 viral copies this assay can detect is 250 copies / mL. A negative result does not preclude SARS-CoV-2 infection and should not be used as the sole basis for treatment or other patient management decisions.  A negative result may occur with improper specimen collection / handling, submission of specimen other than nasopharyngeal swab, presence of viral mutation(s) within the areas targeted by this assay, and inadequate  number of viral copies (<250 copies / mL). A negative result must be combined with clinical observations, patient history, and epidemiological information.  Fact Sheet for Patients:   StrictlyIdeas.no  Fact Sheet for Healthcare Providers: BankingDealers.co.za  This test is not yet approved or  cleared by the [place] FDA and has been authorized for detection and/or diagnosis of SARS-CoV-2 by FDA under an Emergency Use Authorization (EUA).  This EUA will remain in effect (meaning this test can be used) for the duration of the COVID-19 declaration under Section 564(b)(1) of the Act, 21 U.S.C. section 360bbb-3(b)(1), unless the authorization is terminated or revoked sooner.  Performed at [facility], [address], [place], [place] [address]     RADIOLOGY:  DG Chest Portable 1 View  Result Date: 11/10/2019 CLINICAL DATA:  Chest pain. EXAM: PORTABLE CHEST 1 VIEW COMPARISON:  October 31, 2019. FINDINGS: Stable cardiomediastinal silhouette. Status post coronary bypass graft. No pneumothorax or pleural effusion is noted. Left-sided pacemaker is unchanged in position. Right internal jugular dialysis catheter is unchanged. Right lung is clear. Minimal lingular subsegmental atelectasis or scarring is noted. Bony thorax is unremarkable. IMPRESSION: Minimal lingular subsegmental atelectasis or scarring. Electronically Signed   By: Marijo Conception M.D.   On: 11/10/2019 12:50    Management plans discussed with the patient, and he is in agreement.  CODE STATUS:  Code Status History    Date Active Date Inactive Code Status Order ID Comments User Context   11/10/2019 1937 11/12/2019 0525 Full Code [id number]  Athena Masse, MD ED   10/31/2019 2142 11/04/2019 2042 Full Code [id number]  Lenore Cordia, MD ED   12/21/2018 2024 01/03/2019 2016 Full Code [id number]  Dustin Flock, MD Inpatient   10/04/2018 1958 10/11/2018 1801 Full Code [id number]  Mayo,  Pete Pelt, MD Inpatient   09/06/2018 0259 09/12/2018 2017 Full Code [id number]  Lance Coon, MD Inpatient   07/25/2018 1717 08/02/2018 1919 Full Code [id number]  Dustin Flock, MD Inpatient   05/25/2018 0352 05/29/2018 2022 Full Code [id number]  Hillary Bow, MD ED   10/09/2016 1003 10/09/2016 1340 Full Code [id number]  Samara Deist, [facility] Inpatient   08/22/2016 0042 08/27/2016 1649 Full Code [id number]  Lance Coon, MD Inpatient   07/18/2014 1818 07/20/2014 2002 Full Code [id number]  [facility] Inpatient   Advance Care Planning Activity    Questions for Most Recent Historical Code Status (Order [id number])       TOTAL TIME TAKING CARE OF THIS PATIENT: 28 minutes on 710 2021. Patient not seen on 711 2021 because the patient was transferred over to [facility].   Loletha Grayer M.D on 11/12/2019 at 7:38 AM  Between 7am to 6pm - Pager - [phone]  After 6pm go to [email or web address] - password [email or web address]  [facility]  CC: Primary care physician; Tracie Harrier, MD

## 2019-11-12 NOTE — Progress Notes (Signed)
Pt transported to [facility] @ 2300. Pt has electric wheelchair that was unable to go. Notified the Administrator Coordinnaator and it was stated that it could be locked up at [facility] until pt is discharged and returned to [facility] and then pt can come and pick it up. Wheelchair has patione name lable on it. Pt discharged to [facility].

## 2019-11-13 ENCOUNTER — Encounter: Admission: RE | Payer: Self-pay | Source: Home / Self Care

## 2019-11-13 ENCOUNTER — Ambulatory Visit: Admission: RE | Admit: 2019-11-13 | Payer: Medicare HMO | Source: Home / Self Care | Admitting: Cardiovascular Disease

## 2019-11-13 DIAGNOSIS — N186 End stage renal disease: Secondary | ICD-10-CM | POA: Diagnosis not present

## 2019-11-13 DIAGNOSIS — I742 Embolism and thrombosis of arteries of the upper extremities: Secondary | ICD-10-CM | POA: Diagnosis not present

## 2019-11-13 DIAGNOSIS — I96 Gangrene, not elsewhere classified: Secondary | ICD-10-CM | POA: Diagnosis not present

## 2019-11-13 DIAGNOSIS — Z992 Dependence on renal dialysis: Secondary | ICD-10-CM | POA: Diagnosis not present

## 2019-11-13 SURGERY — ECHOCARDIOGRAM, TRANSESOPHAGEAL
Anesthesia: Moderate Sedation

## 2019-11-16 ENCOUNTER — Ambulatory Visit (INDEPENDENT_AMBULATORY_CARE_PROVIDER_SITE_OTHER): Payer: Medicare HMO | Admitting: Nurse Practitioner

## 2019-11-16 ENCOUNTER — Encounter (INDEPENDENT_AMBULATORY_CARE_PROVIDER_SITE_OTHER): Payer: Medicare HMO

## 2019-11-29 ENCOUNTER — Ambulatory Visit: Payer: Self-pay

## 2019-11-30 DIAGNOSIS — I96 Gangrene, not elsewhere classified: Secondary | ICD-10-CM | POA: Diagnosis not present

## 2019-11-30 DIAGNOSIS — I70208 Unspecified atherosclerosis of native arteries of extremities, other extremity: Secondary | ICD-10-CM | POA: Diagnosis not present

## 2019-11-30 DIAGNOSIS — M6281 Muscle weakness (generalized): Secondary | ICD-10-CM | POA: Diagnosis not present

## 2019-11-30 DIAGNOSIS — N186 End stage renal disease: Secondary | ICD-10-CM | POA: Diagnosis not present

## 2019-11-30 DIAGNOSIS — E1143 Type 2 diabetes mellitus with diabetic autonomic (poly)neuropathy: Secondary | ICD-10-CM | POA: Diagnosis not present

## 2019-11-30 DIAGNOSIS — Z743 Need for continuous supervision: Secondary | ICD-10-CM | POA: Diagnosis not present

## 2019-11-30 DIAGNOSIS — I25810 Atherosclerosis of coronary artery bypass graft(s) without angina pectoris: Secondary | ICD-10-CM | POA: Diagnosis not present

## 2019-11-30 DIAGNOSIS — R279 Unspecified lack of coordination: Secondary | ICD-10-CM | POA: Diagnosis not present

## 2019-11-30 DIAGNOSIS — T82897A Other specified complication of cardiac prosthetic devices, implants and grafts, initial encounter: Secondary | ICD-10-CM | POA: Diagnosis not present

## 2019-11-30 DIAGNOSIS — R5381 Other malaise: Secondary | ICD-10-CM | POA: Diagnosis not present

## 2019-11-30 DIAGNOSIS — T82897D Other specified complication of cardiac prosthetic devices, implants and grafts, subsequent encounter: Secondary | ICD-10-CM | POA: Diagnosis not present

## 2019-11-30 DIAGNOSIS — I472 Ventricular tachycardia: Secondary | ICD-10-CM | POA: Diagnosis not present

## 2019-11-30 DIAGNOSIS — N183 Chronic kidney disease, stage 3 unspecified: Secondary | ICD-10-CM | POA: Diagnosis not present

## 2019-11-30 DIAGNOSIS — Z992 Dependence on renal dialysis: Secondary | ICD-10-CM | POA: Diagnosis not present

## 2019-12-01 DIAGNOSIS — N186 End stage renal disease: Secondary | ICD-10-CM | POA: Diagnosis not present

## 2019-12-01 DIAGNOSIS — Z992 Dependence on renal dialysis: Secondary | ICD-10-CM | POA: Diagnosis not present

## 2019-12-02 DIAGNOSIS — Z992 Dependence on renal dialysis: Secondary | ICD-10-CM | POA: Diagnosis not present

## 2019-12-02 DIAGNOSIS — N186 End stage renal disease: Secondary | ICD-10-CM | POA: Diagnosis not present

## 2019-12-03 DIAGNOSIS — L819 Disorder of pigmentation, unspecified: Secondary | ICD-10-CM | POA: Diagnosis not present

## 2019-12-03 DIAGNOSIS — Z89021 Acquired absence of right finger(s): Secondary | ICD-10-CM | POA: Diagnosis not present

## 2019-12-03 DIAGNOSIS — I25810 Atherosclerosis of coronary artery bypass graft(s) without angina pectoris: Secondary | ICD-10-CM | POA: Diagnosis not present

## 2019-12-03 DIAGNOSIS — I96 Gangrene, not elsewhere classified: Secondary | ICD-10-CM | POA: Diagnosis not present

## 2019-12-03 DIAGNOSIS — S88111A Complete traumatic amputation at level between knee and ankle, right lower leg, initial encounter: Secondary | ICD-10-CM | POA: Diagnosis not present

## 2019-12-03 DIAGNOSIS — I472 Ventricular tachycardia: Secondary | ICD-10-CM | POA: Diagnosis not present

## 2019-12-03 DIAGNOSIS — Z992 Dependence on renal dialysis: Secondary | ICD-10-CM | POA: Diagnosis not present

## 2019-12-03 DIAGNOSIS — Z89511 Acquired absence of right leg below knee: Secondary | ICD-10-CM | POA: Diagnosis not present

## 2019-12-03 DIAGNOSIS — S61001A Unspecified open wound of right thumb without damage to nail, initial encounter: Secondary | ICD-10-CM | POA: Diagnosis not present

## 2019-12-03 DIAGNOSIS — T82897D Other specified complication of cardiac prosthetic devices, implants and grafts, subsequent encounter: Secondary | ICD-10-CM | POA: Diagnosis not present

## 2019-12-03 DIAGNOSIS — R2681 Unsteadiness on feet: Secondary | ICD-10-CM | POA: Diagnosis not present

## 2019-12-03 DIAGNOSIS — G4700 Insomnia, unspecified: Secondary | ICD-10-CM | POA: Diagnosis not present

## 2019-12-03 DIAGNOSIS — S61001D Unspecified open wound of right thumb without damage to nail, subsequent encounter: Secondary | ICD-10-CM | POA: Diagnosis not present

## 2019-12-03 DIAGNOSIS — I10 Essential (primary) hypertension: Secondary | ICD-10-CM | POA: Diagnosis not present

## 2019-12-03 DIAGNOSIS — Z4802 Encounter for removal of sutures: Secondary | ICD-10-CM | POA: Diagnosis not present

## 2019-12-03 DIAGNOSIS — R234 Changes in skin texture: Secondary | ICD-10-CM | POA: Diagnosis not present

## 2019-12-03 DIAGNOSIS — E118 Type 2 diabetes mellitus with unspecified complications: Secondary | ICD-10-CM | POA: Diagnosis not present

## 2019-12-03 DIAGNOSIS — N183 Chronic kidney disease, stage 3 unspecified: Secondary | ICD-10-CM | POA: Diagnosis not present

## 2019-12-03 DIAGNOSIS — M79644 Pain in right finger(s): Secondary | ICD-10-CM | POA: Diagnosis not present

## 2019-12-03 DIAGNOSIS — E1152 Type 2 diabetes mellitus with diabetic peripheral angiopathy with gangrene: Secondary | ICD-10-CM | POA: Diagnosis not present

## 2019-12-03 DIAGNOSIS — Z886 Allergy status to analgesic agent status: Secondary | ICD-10-CM | POA: Diagnosis not present

## 2019-12-03 DIAGNOSIS — I739 Peripheral vascular disease, unspecified: Secondary | ICD-10-CM | POA: Diagnosis not present

## 2019-12-03 DIAGNOSIS — M6281 Muscle weakness (generalized): Secondary | ICD-10-CM | POA: Diagnosis not present

## 2019-12-03 DIAGNOSIS — I7389 Other specified peripheral vascular diseases: Secondary | ICD-10-CM | POA: Diagnosis not present

## 2019-12-03 DIAGNOSIS — N186 End stage renal disease: Secondary | ICD-10-CM | POA: Diagnosis not present

## 2019-12-03 DIAGNOSIS — E1143 Type 2 diabetes mellitus with diabetic autonomic (poly)neuropathy: Secondary | ICD-10-CM | POA: Diagnosis not present

## 2019-12-03 DIAGNOSIS — Z888 Allergy status to other drugs, medicaments and biological substances status: Secondary | ICD-10-CM | POA: Diagnosis not present

## 2019-12-04 DIAGNOSIS — Z992 Dependence on renal dialysis: Secondary | ICD-10-CM | POA: Diagnosis not present

## 2019-12-04 DIAGNOSIS — N186 End stage renal disease: Secondary | ICD-10-CM | POA: Diagnosis not present

## 2019-12-05 DIAGNOSIS — I25810 Atherosclerosis of coronary artery bypass graft(s) without angina pectoris: Secondary | ICD-10-CM | POA: Diagnosis not present

## 2019-12-05 DIAGNOSIS — Z89511 Acquired absence of right leg below knee: Secondary | ICD-10-CM | POA: Diagnosis not present

## 2019-12-05 DIAGNOSIS — I739 Peripheral vascular disease, unspecified: Secondary | ICD-10-CM | POA: Diagnosis not present

## 2019-12-05 DIAGNOSIS — Z89021 Acquired absence of right finger(s): Secondary | ICD-10-CM | POA: Diagnosis not present

## 2019-12-05 DIAGNOSIS — N186 End stage renal disease: Secondary | ICD-10-CM | POA: Diagnosis not present

## 2019-12-06 DIAGNOSIS — Z886 Allergy status to analgesic agent status: Secondary | ICD-10-CM | POA: Diagnosis not present

## 2019-12-06 DIAGNOSIS — N186 End stage renal disease: Secondary | ICD-10-CM | POA: Diagnosis not present

## 2019-12-06 DIAGNOSIS — Z992 Dependence on renal dialysis: Secondary | ICD-10-CM | POA: Diagnosis not present

## 2019-12-06 DIAGNOSIS — Z4802 Encounter for removal of sutures: Secondary | ICD-10-CM | POA: Diagnosis not present

## 2019-12-06 DIAGNOSIS — Z888 Allergy status to other drugs, medicaments and biological substances status: Secondary | ICD-10-CM | POA: Diagnosis not present

## 2019-12-06 DIAGNOSIS — E1152 Type 2 diabetes mellitus with diabetic peripheral angiopathy with gangrene: Secondary | ICD-10-CM | POA: Diagnosis not present

## 2019-12-07 DIAGNOSIS — I739 Peripheral vascular disease, unspecified: Secondary | ICD-10-CM | POA: Diagnosis not present

## 2019-12-07 DIAGNOSIS — N186 End stage renal disease: Secondary | ICD-10-CM | POA: Diagnosis not present

## 2019-12-07 DIAGNOSIS — Z89511 Acquired absence of right leg below knee: Secondary | ICD-10-CM | POA: Diagnosis not present

## 2019-12-07 DIAGNOSIS — E118 Type 2 diabetes mellitus with unspecified complications: Secondary | ICD-10-CM | POA: Diagnosis not present

## 2019-12-08 DIAGNOSIS — Z992 Dependence on renal dialysis: Secondary | ICD-10-CM | POA: Diagnosis not present

## 2019-12-08 DIAGNOSIS — N186 End stage renal disease: Secondary | ICD-10-CM | POA: Diagnosis not present

## 2019-12-11 ENCOUNTER — Ambulatory Visit: Payer: Medicare HMO | Admitting: Physician Assistant

## 2019-12-11 ENCOUNTER — Other Ambulatory Visit: Payer: Self-pay

## 2019-12-11 DIAGNOSIS — N186 End stage renal disease: Secondary | ICD-10-CM | POA: Diagnosis not present

## 2019-12-11 DIAGNOSIS — Z992 Dependence on renal dialysis: Secondary | ICD-10-CM | POA: Diagnosis not present

## 2019-12-11 NOTE — Patient Outreach (Signed)
[facility]) Care Management  12/11/2019  Paul [name]. July 02, 1965 [id number]   Telephone Assessment     Outreach attempt to patient to check on him. Patient reports that he was discharged from hospital and sent to rehab facility. He remains at facility but has been told that "his 14 days are up on Thurs." He reports he has no place to go as his landlord kicked him out while he was hospitalized. He is unable to stay with family due to two level home and patient wheelchair bound. He reports SW/CM at facility is working on housing options and discharge plan for him. Support given to patient as he verbalizes how he has gone through so much lately.      Plan: RN CM will follow up with patient after discharge.   [name], RN,BSN,CCM [place] [facility] Telephonic Care Management Coordinator Direct Phone: [phone] Toll Free: [phone] Fax: [phone]

## 2019-12-12 DIAGNOSIS — Z89511 Acquired absence of right leg below knee: Secondary | ICD-10-CM | POA: Diagnosis not present

## 2019-12-12 DIAGNOSIS — N186 End stage renal disease: Secondary | ICD-10-CM | POA: Diagnosis not present

## 2019-12-12 DIAGNOSIS — L819 Disorder of pigmentation, unspecified: Secondary | ICD-10-CM | POA: Diagnosis not present

## 2019-12-12 DIAGNOSIS — Z89021 Acquired absence of right finger(s): Secondary | ICD-10-CM | POA: Diagnosis not present

## 2019-12-12 DIAGNOSIS — M79644 Pain in right finger(s): Secondary | ICD-10-CM | POA: Diagnosis not present

## 2019-12-15 DIAGNOSIS — N186 End stage renal disease: Secondary | ICD-10-CM | POA: Diagnosis not present

## 2019-12-15 DIAGNOSIS — Z992 Dependence on renal dialysis: Secondary | ICD-10-CM | POA: Diagnosis not present

## 2019-12-18 ENCOUNTER — Other Ambulatory Visit (INDEPENDENT_AMBULATORY_CARE_PROVIDER_SITE_OTHER): Payer: Self-pay | Admitting: Nurse Practitioner

## 2019-12-18 DIAGNOSIS — Z992 Dependence on renal dialysis: Secondary | ICD-10-CM | POA: Diagnosis not present

## 2019-12-18 DIAGNOSIS — N186 End stage renal disease: Secondary | ICD-10-CM | POA: Diagnosis not present

## 2019-12-19 DIAGNOSIS — G4700 Insomnia, unspecified: Secondary | ICD-10-CM | POA: Diagnosis not present

## 2019-12-19 DIAGNOSIS — Z89021 Acquired absence of right finger(s): Secondary | ICD-10-CM | POA: Diagnosis not present

## 2019-12-19 DIAGNOSIS — M79644 Pain in right finger(s): Secondary | ICD-10-CM | POA: Diagnosis not present

## 2019-12-19 DIAGNOSIS — L819 Disorder of pigmentation, unspecified: Secondary | ICD-10-CM | POA: Diagnosis not present

## 2019-12-19 DIAGNOSIS — N186 End stage renal disease: Secondary | ICD-10-CM | POA: Diagnosis not present

## 2019-12-20 ENCOUNTER — Encounter (INDEPENDENT_AMBULATORY_CARE_PROVIDER_SITE_OTHER): Payer: Self-pay

## 2019-12-20 ENCOUNTER — Ambulatory Visit (INDEPENDENT_AMBULATORY_CARE_PROVIDER_SITE_OTHER): Payer: Medicare HMO | Admitting: Nurse Practitioner

## 2019-12-20 ENCOUNTER — Ambulatory Visit (INDEPENDENT_AMBULATORY_CARE_PROVIDER_SITE_OTHER): Payer: Medicare HMO

## 2019-12-20 ENCOUNTER — Other Ambulatory Visit: Payer: Self-pay

## 2019-12-20 ENCOUNTER — Encounter (INDEPENDENT_AMBULATORY_CARE_PROVIDER_SITE_OTHER): Payer: Self-pay | Admitting: Nurse Practitioner

## 2019-12-20 VITALS — BP 132/73 | HR 81 | Resp 16 | Wt 198.6 lb

## 2019-12-20 DIAGNOSIS — I10 Essential (primary) hypertension: Secondary | ICD-10-CM

## 2019-12-20 DIAGNOSIS — S88111A Complete traumatic amputation at level between knee and ankle, right lower leg, initial encounter: Secondary | ICD-10-CM

## 2019-12-20 DIAGNOSIS — N186 End stage renal disease: Secondary | ICD-10-CM | POA: Diagnosis not present

## 2019-12-20 DIAGNOSIS — Z992 Dependence on renal dialysis: Secondary | ICD-10-CM

## 2019-12-20 NOTE — Progress Notes (Signed)
Subjective:    Patient ID: Paul Ala., male    DOB: [birth year], 54 y.o.   MRN: [id number] Chief Complaint  Patient presents with  . Follow-up    updated note for new prothesis     Patient presents today for evaluation of his right below-knee amputation.  Recently while the patient was hospitalized, he was required to gain 20 pounds due to frequent hypotension.  Now that the patient has gained 20 pounds his blood pressure has stabilized.  Based on this the 20 pound weight gain is now his new dry weight.  Following this weight gain the patient is no longer able to fit his prosthetic.  The patient was previously working with physical therapy to begin using his prosthetic but this is been delayed due to not being able to fit any longer.  There are no ulcerations or wounds.  He denies any swelling or pain in the stump.  The patient has also been utilizing his stump shrinker diligently.  Originally the patient was also scheduled to have an HDA for evaluation of his right upper extremity access for possible cannulation.  However the patient has had multiple incidences of thrombus and not only his access but in his hand and he refuses at this time to utilize at access therefore per the patient request the HD was canceled.   Review of Systems  Cardiovascular: Positive for leg swelling.  Musculoskeletal: Positive for gait problem.  Skin: Positive for wound.  Neurological: Positive for weakness.  All other systems reviewed and are negative.      Objective:   Physical Exam Vitals reviewed.  HENT:     Head: Normocephalic.  Cardiovascular:     Rate and Rhythm: Normal rate.     Pulses: Normal pulses.  Pulmonary:     Effort: Pulmonary effort is normal.  Musculoskeletal:     Right hand: Swelling present.     Left lower leg: Edema present.     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  Skin:     General: Skin is warm.  Neurological:     Mental Status: He is alert and oriented to person, place, and time.     Motor: Weakness present.  Psychiatric:        Mood and Affect: Mood normal.        Behavior: Behavior normal.        Thought Content: Thought content normal.        Judgment: Judgment normal.     BP 132/73 (BP Location: Left Arm)   Pulse 81   Resp 16   Wt 198 lb 10.2 oz (90.1 kg)   BMI 29.33 kg/m   Past Medical History:  Diagnosis Date  . AICD (automatic cardioverter/defibrillator) present 2011  . Anemia   . Cardiac defibrillator in place    a. Biotronik LUmax 540 DRT, (ser # [id number]).  . Carotid arterial disease ([place])    a. s/p prior LICA stenting;  b. 07/7167 Carotid U/S: 40-59% bilat ICA stenosis. Patent LICA stent.  . CHF (congestive heart failure) ([place])   . CKD (chronic kidney disease), stage III   . Coronary artery disease    a. 2010 s/p CABG x 3.  . DDD (degenerative disc disease), lumbosacral    L5-S1  . Diabetes ([place])    Lantus at bedtime  . Dialysis patient [facility])    M,W,F  .  Employs prosthetic leg    Right BKA  . Gangrene of toe of right foot ([place])   . Gout of left hand 10/06/2016  . HFrEF (heart failure with reduced ejection fraction) ([place])    a. 07/2016 Echo: EF 25-30%, diff HK, mild MR, mildly dil LA, mod reduced RV fxn, PASP 73mmHg.  Marland Kitchen Hypertension    takes Coreg daily  . Ischemic cardiomyopathy    a. 07/2016 Echo: EF 25-30%, diff HK.  . Myocardial infarction ([place]) 2009  . Peripheral vascular disease ([place])   . Sleep apnea    sleep study yr ago-unable to afford cpap  . Stroke [facility]) 09   no weakness    Social History   Socioeconomic History  . Marital status: Single    Spouse name: Not on file  . Number of children: Not on file  . Years of education: Not on file  . Highest education level: Not on file  Occupational History  . Not on file  Tobacco Use  . Smoking status: Never Smoker  . Smokeless tobacco: Never Used  Vaping Use    . Vaping Use: Never used  Substance and Sexual Activity  . Alcohol use: No  . Drug use: Yes    Types: Marijuana  . Sexual activity: Not on file  Other Topics Concern  . Not on file  Social History Narrative   Lives at home alone   Social Determinants of Health   Financial Resource Strain:   . Difficulty of Paying Living Expenses:   Food Insecurity: Food Insecurity Present  . Worried About Charity fundraiser in the Last Year: Sometimes true  . Ran Out of Food in the Last Year: Never true  Transportation Needs: Unmet Transportation Needs  . Lack of Transportation (Medical): Yes  . Lack of Transportation (Non-Medical): Yes  Physical Activity:   . Days of Exercise per Week:   . Minutes of Exercise per Session:   Stress:   . Feeling of Stress :   Social Connections:   . Frequency of Communication with Friends and Family:   . Frequency of Social Gatherings with Friends and Family:   . Attends Religious Services:   . Active Member of Clubs or Organizations:   . Attends Archivist Meetings:   Marland Kitchen Marital Status:   Intimate Partner Violence:   . Fear of Current or Ex-Partner:   . Emotionally Abused:   Marland Kitchen Physically Abused:   . Sexually Abused:     Past Surgical History:  Procedure Laterality Date  . AMPUTATION Right 12/23/2018   Procedure: AMPUTATION BELOW KNEE (Right);  Surgeon: Algernon Huxley, MD;  Location: [facility];  Service: General;  Laterality: Right;  . AMPUTATION TOE Right 08/22/2016   Procedure: AMPUTATION TOE;  Surgeon: Samara Deist, DPM;  Location: [facility];  Service: Podiatry;  Laterality: Right;  . AMPUTATION TOE Right 10/09/2016   Procedure: AMPUTATION TOE-RIGHT 2ND MPJ;  Surgeon: Samara Deist, DPM;  Location: [facility];  Service: Podiatry;  Laterality: Right;  . APLIGRAFT PLACEMENT Right 10/09/2016   Procedure: APLIGRAFT PLACEMENT;  Surgeon: Samara Deist, DPM;  Location: [facility];  Service: Podiatry;  Laterality: Right;  . AV FISTULA PLACEMENT Right  05/11/2019   Procedure: INSERTION OF ARTERIOVENOUS (AV) GORE-TEX GRAFT ARM;  Surgeon: Algernon Huxley, MD;  Location: [facility];  Service: Vascular;  Laterality: Right;  . CALCANEAL OSTEOTOMY Bilateral 07/28/2018   Procedure: RIGHT CALCANECTOMY BILATERAL DEBRIDEMENT OF ULCERS ON HEELS;  Surgeon: Albertine Patricia, DPM;  Location:  [facility];  Service: Podiatry;  Laterality: Bilateral;  . CARDIAC DEFIBRILLATOR PLACEMENT  2011  . CAROTID ENDARTERECTOMY Left   . CATARACT EXTRACTION W/PHACO Right 11/08/2019   Procedure: CATARACT EXTRACTION PHACO AND INTRAOCULAR LENS PLACEMENT ([place]) RIGHT DIABETIC HEALON 5 VISION BLUE;  Surgeon: Leandrew Koyanagi, MD;  Location: [place];  Service: Ophthalmology;  Laterality: Right;  20.23 1:54.0 17.7%  . CHOLECYSTECTOMY  2010  . CORONARY ARTERY BYPASS GRAFT  2010   CABG x 3   . DIALYSIS/PERMA CATHETER INSERTION N/A 10/07/2018   Procedure: DIALYSIS/PERMA CATHETER INSERTION;  Surgeon: Katha Cabal, MD;  Location: [place] [facility];  Service: Cardiovascular;  Laterality: N/A;  . GRAFT APPLICATION Right 09/03/7739   Procedure: FULL THICKNESS SKIN GRAFT-RIGHT FOOT;  Surgeon: Algernon Huxley, MD;  Location: [facility];  Service: Vascular;  Laterality: Right;  . HIP SURGERY Right 1994  . INCISION AND DRAINAGE Right 09/08/2018   Procedure: INCISION AND DRAINAGE - [place] OF DEFECTIVE SKIN, SOFT TISSUE AND BONE;  Surgeon: Albertine Patricia, DPM;  Location: [facility];  Service: Podiatry;  Laterality: Right;  . INCISION AND DRAINAGE OF WOUND Right 08/22/2016   Procedure: IRRIGATION AND DEBRIDEMENT WOUND and wound vac placement;  Surgeon: Samara Deist, DPM;  Location: [facility];  Service: Podiatry;  Laterality: Right;  . IRRIGATION AND DEBRIDEMENT ABSCESS Right 11/24/2018   Procedure: IRRIGATION AND DEBRIDEMENT ABSCESS RIGHT FOOT, COMPLICATED, DIABETIC;  Surgeon: Albertine Patricia, DPM;  Location: [facility];  Service: Podiatry;  Laterality: Right;  . LOWER EXTREMITY  ANGIOGRAPHY Right 08/24/2016   Procedure: Lower Extremity Angiography;  Surgeon: Algernon Huxley, MD;  Location: [place] [facility];  Service: Cardiovascular;  Laterality: Right;  . LOWER EXTREMITY ANGIOGRAPHY Right 07/27/2018   Procedure: RIGHT Lower Extremity Angiography;  Surgeon: Algernon Huxley, MD;  Location: [place] [facility];  Service: Cardiovascular;  Laterality: Right;  . LOWER EXTREMITY ANGIOGRAPHY Right 09/09/2018   Procedure: Lower Extremity Angiography;  Surgeon: Katha Cabal, MD;  Location: [place] [facility];  Service: Cardiovascular;  Laterality: Right;  . MASS EXCISION Right 07/18/2014   Procedure: EXCISION HETEROTOPIC BONE RIGHT HIP;  Surgeon: Frederik Pear, MD;  Location: [place];  Service: Orthopedics;  Laterality: Right;  . Open Heart Surgery  2010   x 3  . PERIPHERAL VASCULAR THROMBECTOMY Right 07/31/2019   Procedure: PERIPHERAL VASCULAR THROMBECTOMY;  Surgeon: Algernon Huxley, MD;  Location: [place] [facility];  Service: Cardiovascular;  Laterality: Right;  . PERIPHERAL VASCULAR THROMBECTOMY Right 08/17/2019   Procedure: PERIPHERAL VASCULAR THROMBECTOMY;  Surgeon: Algernon Huxley, MD;  Location: [place] [facility];  Service: Cardiovascular;  Laterality: Right;  . PERIPHERAL VASCULAR THROMBECTOMY Right 09/14/2019   Procedure: PERIPHERAL VASCULAR THROMBECTOMY;  Surgeon: Algernon Huxley, MD;  Location: [place] [facility];  Service: Cardiovascular;  Laterality: Right;  . WOUND DEBRIDEMENT Right 10/09/2016   Procedure: DEBRIDEMENT WOUND;  Surgeon: Samara Deist, DPM;  Location: [facility];  Service: Podiatry;  Laterality: Right;    Family History  Problem Relation Age of Onset  . Hypertension Other   . Diabetes Other   . Diabetes Father   . Hyperlipidemia Father   . Hypertension Father   . Hyperlipidemia Sister   . Hypertension Sister   . Diabetes Sister   . Diabetes Brother   . Hypertension Brother   . Hyperlipidemia Brother     Allergies  Allergen Reactions  . Other  Other (See Comments)    Cardiac Problems. Pt states he tolerates  Toradol. Due to kidney and heart problems per pt  . Baclofen Other (See Comments)    vertigo  . Metformin Diarrhea  . Nsaids     Due to kidney and heart problems per pt. Pt states he tolerates Toradol       Assessment & Plan:   1. ESRD on dialysis [facility]) Patient is currently maintained via right chest PermCath.  Patient does have upper extremity access however he refuses to utilize it due to frequent instances of thrombosis.  2. Essential hypertension Patient's blood pressure under good control.  No medication changes needed at this time.   3. Below-knee amputation of right lower extremity (HCC) Based on the patient's extensive weight gain he is unable to fit his old prosthetic.  Because of this patient should have a new right below-knee prosthetic socket replacement and supplies.  The patient is not expected to lose his weight as this is his new established dry weight.  This prosthetic will be necessary for immobility prevention.  Patient will follow up as needed.   Current Outpatient Medications on File Prior to Visit  Medication Sig Dispense Refill  . allopurinol (ZYLOPRIM) 100 MG tablet Take 100 mg by mouth daily.    . Alum & Mag Hydroxide-Simeth (GERI-LANTA PO) Take by mouth.    Marland Kitchen amiodarone (PACERONE) 200 MG tablet Take 2 tablets (400mg ) by mouth twice daily until 12/01/19; then on 12/02/19 begin taking 1 tablet (400mg ) daily or as directed by provider.    Marland Kitchen aspirin EC 81 MG tablet Take 81 mg by mouth daily. Swallow whole.    . calcium acetate (PHOSLO) 667 MG capsule Take by mouth.    Marland Kitchen HYDROcodone-acetaminophen (NORCO/VICODIN) 5-325 MG tablet Take by mouth.    . hydrocortisone cream 1 % Apply 1 application topically 2 (two) times daily.    Marland Kitchen lactulose, encephalopathy, (GENERLAC) 10 GM/15ML SOLN Take by mouth.    . melatonin 3 MG TABS tablet Take 3 mg by mouth at bedtime.    . mexiletine (MEXITIL) 150 MG capsule  Take by mouth.    . polyethylene glycol powder (GAVILAX) 17 GM/SCOOP powder Take 1 Container by mouth daily.    . predniSONE (DELTASONE) 5 MG tablet Take 5 mg by mouth daily with breakfast.    . senna (SENOKOT) 8.6 MG tablet Take 1 tablet by mouth daily.     No current facility-administered medications on file prior to visit.    There are no Patient Instructions on file for this visit. No follow-ups on file.   Kris Hartmann, NP

## 2019-12-22 DIAGNOSIS — S61001D Unspecified open wound of right thumb without damage to nail, subsequent encounter: Secondary | ICD-10-CM | POA: Diagnosis not present

## 2019-12-22 DIAGNOSIS — S61001A Unspecified open wound of right thumb without damage to nail, initial encounter: Secondary | ICD-10-CM | POA: Diagnosis not present

## 2019-12-22 DIAGNOSIS — R234 Changes in skin texture: Secondary | ICD-10-CM | POA: Diagnosis not present

## 2019-12-22 DIAGNOSIS — I7389 Other specified peripheral vascular diseases: Secondary | ICD-10-CM | POA: Diagnosis not present

## 2019-12-22 DIAGNOSIS — Z992 Dependence on renal dialysis: Secondary | ICD-10-CM | POA: Diagnosis not present

## 2019-12-22 DIAGNOSIS — I739 Peripheral vascular disease, unspecified: Secondary | ICD-10-CM | POA: Diagnosis not present

## 2019-12-22 DIAGNOSIS — Z89021 Acquired absence of right finger(s): Secondary | ICD-10-CM | POA: Diagnosis not present

## 2019-12-22 DIAGNOSIS — N186 End stage renal disease: Secondary | ICD-10-CM | POA: Diagnosis not present

## 2019-12-25 DIAGNOSIS — N186 End stage renal disease: Secondary | ICD-10-CM | POA: Diagnosis not present

## 2019-12-25 DIAGNOSIS — Z992 Dependence on renal dialysis: Secondary | ICD-10-CM | POA: Diagnosis not present

## 2019-12-27 DIAGNOSIS — N186 End stage renal disease: Secondary | ICD-10-CM | POA: Diagnosis not present

## 2019-12-27 DIAGNOSIS — L819 Disorder of pigmentation, unspecified: Secondary | ICD-10-CM | POA: Diagnosis not present

## 2019-12-27 DIAGNOSIS — M79644 Pain in right finger(s): Secondary | ICD-10-CM | POA: Diagnosis not present

## 2019-12-27 DIAGNOSIS — Z89021 Acquired absence of right finger(s): Secondary | ICD-10-CM | POA: Diagnosis not present

## 2019-12-29 DIAGNOSIS — Z992 Dependence on renal dialysis: Secondary | ICD-10-CM | POA: Diagnosis not present

## 2019-12-29 DIAGNOSIS — N186 End stage renal disease: Secondary | ICD-10-CM | POA: Diagnosis not present

## 2019-12-30 DIAGNOSIS — R600 Localized edema: Secondary | ICD-10-CM | POA: Diagnosis not present

## 2019-12-30 DIAGNOSIS — N184 Chronic kidney disease, stage 4 (severe): Secondary | ICD-10-CM | POA: Diagnosis not present

## 2019-12-30 DIAGNOSIS — I5042 Chronic combined systolic (congestive) and diastolic (congestive) heart failure: Secondary | ICD-10-CM | POA: Diagnosis not present

## 2019-12-30 DIAGNOSIS — S88111A Complete traumatic amputation at level between knee and ankle, right lower leg, initial encounter: Secondary | ICD-10-CM | POA: Diagnosis not present

## 2019-12-30 DIAGNOSIS — M5136 Other intervertebral disc degeneration, lumbar region: Secondary | ICD-10-CM | POA: Diagnosis not present

## 2019-12-30 DIAGNOSIS — I679 Cerebrovascular disease, unspecified: Secondary | ICD-10-CM | POA: Diagnosis not present

## 2020-01-01 DIAGNOSIS — Z992 Dependence on renal dialysis: Secondary | ICD-10-CM | POA: Diagnosis not present

## 2020-01-01 DIAGNOSIS — E1122 Type 2 diabetes mellitus with diabetic chronic kidney disease: Secondary | ICD-10-CM | POA: Diagnosis not present

## 2020-01-01 DIAGNOSIS — Z89111 Acquired absence of right hand: Secondary | ICD-10-CM | POA: Diagnosis not present

## 2020-01-01 DIAGNOSIS — I509 Heart failure, unspecified: Secondary | ICD-10-CM | POA: Diagnosis not present

## 2020-01-01 DIAGNOSIS — N186 End stage renal disease: Secondary | ICD-10-CM | POA: Diagnosis not present

## 2020-01-01 DIAGNOSIS — Z4781 Encounter for orthopedic aftercare following surgical amputation: Secondary | ICD-10-CM | POA: Diagnosis not present

## 2020-01-01 DIAGNOSIS — E1152 Type 2 diabetes mellitus with diabetic peripheral angiopathy with gangrene: Secondary | ICD-10-CM | POA: Diagnosis not present

## 2020-01-01 DIAGNOSIS — I132 Hypertensive heart and chronic kidney disease with heart failure and with stage 5 chronic kidney disease, or end stage renal disease: Secondary | ICD-10-CM | POA: Diagnosis not present

## 2020-01-02 DIAGNOSIS — Z992 Dependence on renal dialysis: Secondary | ICD-10-CM | POA: Diagnosis not present

## 2020-01-02 DIAGNOSIS — S61101D Unspecified open wound of right thumb with damage to nail, subsequent encounter: Secondary | ICD-10-CM | POA: Diagnosis not present

## 2020-01-02 DIAGNOSIS — N186 End stage renal disease: Secondary | ICD-10-CM | POA: Diagnosis not present

## 2020-01-03 DIAGNOSIS — N186 End stage renal disease: Secondary | ICD-10-CM | POA: Diagnosis not present

## 2020-01-03 DIAGNOSIS — Z992 Dependence on renal dialysis: Secondary | ICD-10-CM | POA: Diagnosis not present

## 2020-01-03 DIAGNOSIS — R6889 Other general symptoms and signs: Secondary | ICD-10-CM | POA: Diagnosis not present

## 2020-01-05 DIAGNOSIS — R6889 Other general symptoms and signs: Secondary | ICD-10-CM | POA: Diagnosis not present

## 2020-01-05 DIAGNOSIS — N186 End stage renal disease: Secondary | ICD-10-CM | POA: Diagnosis not present

## 2020-01-05 DIAGNOSIS — Z992 Dependence on renal dialysis: Secondary | ICD-10-CM | POA: Diagnosis not present

## 2020-01-08 DIAGNOSIS — R6889 Other general symptoms and signs: Secondary | ICD-10-CM | POA: Diagnosis not present

## 2020-01-08 DIAGNOSIS — Z20822 Contact with and (suspected) exposure to covid-19: Secondary | ICD-10-CM | POA: Diagnosis not present

## 2020-01-08 DIAGNOSIS — N186 End stage renal disease: Secondary | ICD-10-CM | POA: Diagnosis not present

## 2020-01-08 DIAGNOSIS — Z992 Dependence on renal dialysis: Secondary | ICD-10-CM | POA: Diagnosis not present

## 2020-01-09 ENCOUNTER — Ambulatory Visit: Payer: Medicare HMO | Admitting: Physician Assistant

## 2020-01-10 ENCOUNTER — Other Ambulatory Visit: Payer: Self-pay

## 2020-01-10 DIAGNOSIS — Z992 Dependence on renal dialysis: Secondary | ICD-10-CM | POA: Diagnosis not present

## 2020-01-10 DIAGNOSIS — R6889 Other general symptoms and signs: Secondary | ICD-10-CM | POA: Diagnosis not present

## 2020-01-10 DIAGNOSIS — N186 End stage renal disease: Secondary | ICD-10-CM | POA: Diagnosis not present

## 2020-01-10 NOTE — Patient Outreach (Signed)
[name]) Care Management  01/10/2020  Paul [name]. 1966-04-08 [id number]   Telephone Assessment    RN CM received notification that patient discharged from facility. Outreach attempt #1 to patient. Recording stating "wireless customer unavailable at this time."    Plan: RN CM will make outreach attempt to patient within 3-4 business days.  [name], RN,BSN,CCM [place] Management Telephonic Care Management Coordinator Direct Phone: [phone] Toll Free: [phone] Fax: [phone]

## 2020-01-11 ENCOUNTER — Other Ambulatory Visit: Payer: Self-pay | Admitting: *Deleted

## 2020-01-11 ENCOUNTER — Other Ambulatory Visit: Payer: Self-pay

## 2020-01-11 NOTE — Patient Outreach (Signed)
[facility]) Care Management  01/11/2020  Paul [name]. 1966/02/17 [id number]   Telephone Assessment   Outreach attempt #2 to patient. Spoke with patient who shares details of recent hospitalization and rehab stay. He reports he was discharged as he ran out of Medicare days at facility. Patient has lost his housing that he had prior to admission. He is currently staying at a hotel and voices he  will run out of funds to pay for hotel within a week. He is unable to stay with any of his family as he reports they do not have a bedroom and bathroom on first level of home. He reports very limited family support. He states he is interested in possible group home where he can receive some assistance with his care needs. Patient is wheelchair bound and has right BKA. He reports that due to recent wgt gain he is unable to fit his prosthesis and has to get it replaced. He is scheduled for outpatient surgery on tomorrow to have one of his fingers amputated. patient reports he has been having difficulty caring for himself but he is managing the best he can. He continues to go to HD on M,W,F and reports he has not missed an appt. He is using [employer] transportation  And family to get back an forth to medical appts.   Medications: Med review completed with patient. He reports  that he is able to manage his meds on his own.   Medications Reviewed Today    Reviewed by Hayden Pedro, RN (Registered Nurse) on 01/11/20 at [phone]  Med List Status: <None>  Medication Order Taking? Sig Documenting Provider Last Dose Status Informant  allopurinol (ZYLOPRIM) 100 MG tablet [id number] Yes Take 100 mg by mouth daily. [provider] Taking Active   Alum & Mag Hydroxide-Simeth (GERI-LANTA PO) [id number] Yes Take by mouth. [provider] Taking Active   amiodarone (PACERONE) 200 MG tablet [id number] Yes Take 2 tablets (400mg ) by mouth twice daily until 12/01/19; then on 12/02/19  begin taking 1 tablet (400mg ) daily or as directed by provider. [provider] Taking Active   aspirin EC 81 MG tablet [id number] No Take 81 mg by mouth daily. Swallow whole.  Patient not taking: Reported on 01/11/2020   [provider] Not Taking Active   calcium acetate (PHOSLO) 667 MG capsule [id number] Yes Take by mouth. [provider] Taking Active   HYDROcodone-acetaminophen (NORCO/VICODIN) 5-325 MG tablet [id number] No Take by mouth.  Patient not taking: Reported on 01/11/2020   [provider] Not Taking Active   hydrocortisone cream 1 % [id number] No Apply 1 application topically 2 (two) times daily.  Patient not taking: Reported on 01/11/2020   [provider] Not Taking Active   lactulose, encephalopathy, (GENERLAC) 10 GM/15ML SOLN [id number] No Take by mouth.  Patient not taking: Reported on 01/11/2020   [provider] Not Taking Active   melatonin 3 MG TABS tablet [id number] No Take 3 mg by mouth at bedtime.  Patient not taking: Reported on 01/11/2020   [provider] Not Taking Active   mexiletine (MEXITIL) 150 MG capsule [id number] Yes Take by mouth. [provider] Taking Active   polyethylene glycol powder (GAVILAX) 17 GM/SCOOP powder [id number] No Take 1 Container by mouth daily.  Patient not taking: Reported on 01/11/2020   [provider] Not Taking Active   predniSONE (DELTASONE) 5 MG tablet [id number] Yes Take 5 mg by mouth daily with breakfast. [provider] Taking Active   senna (SENOKOT) 8.6 MG tablet [id number] No Take 1 tablet by mouth daily.  Patient not taking: Reported on 01/11/2020   [provider] Not Taking Active           Appointments: Patient being followed by several specialists and seeing them as advised. He is unsure of next PCP appt.  SDOH Screenings   Alcohol Screen:    Last Alcohol Screening Score (AUDIT): Not on file  Depression (PHQ2-9): Low Risk     PHQ-2 Score: 0  Financial Resource Strain:    Difficulty of Paying Living Expenses: Not on file  Food Insecurity: Food Insecurity Present   Worried About Running Out of Food in the Last Year: Sometimes true   Ran Out of Food in the Last Year: Sometimes true  Housing: High Risk   Last Housing Risk Score: 3  Physical Activity:    Days of Exercise per Week: Not on file   Minutes of Exercise per Session: Not on file  Social Connections:    Frequency of Communication with Friends and Family: Not on file   Frequency of Social Gatherings with Friends and Family: Not on file   Attends Religious Services: Not on Electrical engineer or Organizations: Not on file   Attends Archivist Meetings: Not on file   Marital Status: Not on file  Stress:    Feeling of Stress : Not on file  Tobacco Use: Low Risk    Smoking Tobacco Use: Never Smoker   Smokeless Tobacco Use: Never Used  Transportation Needs: Public librarian (Medical): Yes   Lack of Transportation (Non-Medical): Yes     THN CM Care Plan Problem One     Most Recent Value  Care Plan Problem One Ongoing reinforcement of self-health management of multiple chronic conditions in patient with ESRD relatievly new to hemodialysis and recent (R) BKA, as evidenced by patient reporting  Role Documenting the Problem One Care Management [place] for Problem One Active  THN Long Term Goal  Patient will have no hospital readmissions over the next 31 days  THN Long Term Goal Start Date 01/11/20  Interventions for Problem One Long Term Goal RNCM assessed for any acute issues/sxs, reviewed action plan for worsenin g condition, reviewed and assessed meds and adherence, confirmed MD f/u appts in place  [facility] CM Short Term Goal #1  Patient will report successful finger amputation surgery without s/s of infection over the next 30 days.  THN CM Short Term Goal #1 Start Date  01/11/20  Interventions for Short Term Goal #1 RNCM reviewed and discussed s/s of infection, post surgical care and importance of MD follow up and adequate nutrition to aide in healing    [facility] CM Care Plan Problem Three     Most Recent Value  Care Plan Problem Three Patient with no housing  Role Documenting the Problem Three Care Management Telephonic [place] for Problem Three Active  [facility] Long Term Goal  Patient will report obtaining secure and adqueate housing over the next 60 days.  THN Long Term Goal Start Date 01/11/20  Interventions for Problem Three Long Term Goal RN CM compleded SDOH assessment and referral to SW     Plan: RN CM discussed with patient next outreach within a week. Patient gave verbal consent and in agreement with RN CM follow up and timeframe. Patient aware that they may contact RN  CM sooner for any issues or concerns. RN CM sent urgent [facility] SW referral for possible assistance with housing situation. RN CM will send quarterly update to PCP.   [name], RN,BSN,CCM [place] Management Telephonic Care Management Coordinator Direct Phone: [phone] Toll Free: [phone] Fax: [phone]

## 2020-01-11 NOTE — Patient Outreach (Addendum)
[place] [facility]) Care Management  01/11/2020  Paul [name]. Jan 24, 1966 [id number]   CSW made an initial attempt to try and contact patient today to perform the phone assessment, as well as assess and assist with social work needs and services, without success.  A HIPAA compliant message was left for patient on voicemail.  CSW is currently awaiting a return call.  CSW will make a second outreach attempt within the next 3-4 business days, if a return call is not received from patient in the meantime.  CSW will also mail an Unsuccessful Patient Outreach Letter to patient's home, requesting that he contact CSW directly if he is interested in receiving social work services and resources through [place] with Scientist, clinical (histocompatibility and immunogenetics).  Nat Christen, BSW, MSW, LCSW  Licensed Education officer, environmental [facility]  Mailing [place] [address], [place], [place] [address] Physical Address-[address], [place],  [address] Toll Free Main # [phone] Fax # [phone] Cell # [phone]  [email or web address]

## 2020-01-12 ENCOUNTER — Encounter: Payer: Self-pay | Admitting: *Deleted

## 2020-01-12 DIAGNOSIS — N186 End stage renal disease: Secondary | ICD-10-CM | POA: Diagnosis not present

## 2020-01-12 DIAGNOSIS — I509 Heart failure, unspecified: Secondary | ICD-10-CM | POA: Diagnosis not present

## 2020-01-12 DIAGNOSIS — R6889 Other general symptoms and signs: Secondary | ICD-10-CM | POA: Diagnosis not present

## 2020-01-12 DIAGNOSIS — Z992 Dependence on renal dialysis: Secondary | ICD-10-CM | POA: Diagnosis not present

## 2020-01-12 DIAGNOSIS — Z95810 Presence of automatic (implantable) cardiac defibrillator: Secondary | ICD-10-CM | POA: Diagnosis not present

## 2020-01-12 DIAGNOSIS — I96 Gangrene, not elsewhere classified: Secondary | ICD-10-CM | POA: Diagnosis not present

## 2020-01-12 DIAGNOSIS — T8751 Necrosis of amputation stump, right upper extremity: Secondary | ICD-10-CM | POA: Diagnosis not present

## 2020-01-16 ENCOUNTER — Other Ambulatory Visit: Payer: Self-pay

## 2020-01-16 ENCOUNTER — Other Ambulatory Visit: Payer: Self-pay | Admitting: *Deleted

## 2020-01-16 ENCOUNTER — Encounter: Payer: Self-pay | Admitting: *Deleted

## 2020-01-16 DIAGNOSIS — Z992 Dependence on renal dialysis: Secondary | ICD-10-CM | POA: Diagnosis not present

## 2020-01-16 DIAGNOSIS — N2581 Secondary hyperparathyroidism of renal origin: Secondary | ICD-10-CM | POA: Diagnosis not present

## 2020-01-16 DIAGNOSIS — D509 Iron deficiency anemia, unspecified: Secondary | ICD-10-CM | POA: Diagnosis not present

## 2020-01-16 DIAGNOSIS — N186 End stage renal disease: Secondary | ICD-10-CM | POA: Diagnosis not present

## 2020-01-16 NOTE — Patient Outreach (Addendum)
[name]) Care Management  01/16/2020  Paul [name]. 1965-07-11 [id number]   Transition of Care Week #2    Outreach attempt # 1 to patient. Spoke with patient. He reports he is doing fairly well. His finger amputation surgery went well last week. He is performing post-op wound care as advised. He reports that he is having pain to site but taking pain med. He reports some constipation related to SE of pain med and instructed patient on how to manage. His main concern/focus remains housing. He states that he has been on the phone this morning calling around trying to get help. Advised patient that [place] had attempted to contact him last week. Patient reports he was unaware. RN CM provided patient with SW contact info and instructed patient to contact ASAP. He voiced understanding. He denies any other RN CM needs or concerns at this time.    THN CM Care Plan Problem One     Most Recent Value  Care Plan Problem One Ongoing reinforcement of self-health management of multiple chronic conditions in patient with ESRD relatievly new to hemodialysis and recent (R) BKA, as evidenced by patient reporting  Role Documenting the Problem One Care Management [place] for Problem One Active  THN Long Term Goal  Patient will have no hospital readmissions over the next 31 days  THN Long Term Goal Start Date 01/11/20  Interventions for Problem One Long Term Goal RNCM assessed for any acute issues/concerns, reviewed action plan with pt  THN CM Short Term Goal #1  Patient will report successful finger amputation surgery without s/s of infection over the next 30 days.  THN CM Short Term Goal #1 Start Date 01/11/20  THN CM Short Term Goal #1 Met Date 01/16/20  THN CM Short Term Goal #2  Patient will report no s/s of infection to surgical site over the next 30 days  THN CM Short Term Goal #2 Start Date 01/16/20  Interventions for Short Term Goal #2 RNCM reviewed s/s of  infection, confirmed pt adhering to post-op care and has f/u appt in place  [name] CM Short Term Goal #3 Patient will report completing post-op follow up appt over the next 30 days  THN CM Short Term Goal #3 Start Date 01/16/20  Interventions for Short [place] Goal #3 RNCM confimed appt in place and pt has transportation to appt    [name] CM Care Plan Problem Three     Most Recent Value  Care Plan Problem Three Patient with no housing  Role Documenting the Problem [place] for Problem Three Active  [facility] Long Term Goal  Patient will report obtaining secure and adqueate housing over the next 60 days.  THN Long Term Goal Start Date 01/11/20  Interventions for Problem Three Long Term Goal RNCM assessed for update to [place]tau of hosuing, RN CM provided [name] SW contact info for pt to f/u       Plan: RN CM discussed with patient next outreach within a week. Patient gave verbal consent and in agreement with RN CM follow up and timeframe. Patient aware that they may contact RN CM sooner for any issues or concerns.    [name], RN,BSN,CCM [place] Management Telephonic Care Management Coordinator Direct Phone: [phone] Toll Free: [phone] Fax: [phone]

## 2020-01-16 NOTE — Patient Outreach (Addendum)
[facility]) Care Management  01/16/2020  Paul [name]. 11-Feb-1966 [id number]   CSW received an incoming call from patient today, in response to the HIPAA compliant message left on voicemail for patient by CSW on Thursday, January 11, 2020.  CSW was able to perform the initial phone assessment with patient, as well as assess and assist with social work needs and services.  CSW introduced self, explained role and types of services provided through [facility] ([facility]).  CSW further explained to patient that CSW works with patient's Telephonic RNCM, also with [facility], Paul [name].  CSW then explained the reason for the call, indicating that [name] thought that patient would benefit from social work services and resources to assist with housing.  CSW obtained two HIPAA compliant identifiers from patient, which included patient's name and date of birth.  Patient reported that he was recently discharged from a skilled nursing facility, where he was receiving short-term rehabilitative services, because he ran out of Medicare coverage through [facility].  Prior to admission into the facility, patient admitted to losing his housing, due to lack of funds.  Patient indicated that he is currently staying at a hotel ([facility]), but that he only has enough money to stay for two additional nights.  Patient denied being able to reside with family members and/or friends, stating that no one has the space available to take him in.  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  Patient admitted that he is currently wheelchair bound, due to right below the knee amputation, unable to fit into his prothesis due to weight gain.   Patient indicated that he is looking for a group-home type  setting, either in [place] or [place].  CSW agreed to e-mail ([email or web address]) patient, per his request, a list of housing resources, as well as a list of financial resources.  The list of housing resources include all of the following:  [place] through [employer]; [place] and [employer]; Chief Operating Officer; [employer]; [employer]; Section 8 Engineer, materials; Soil scientist; Cares ACT Rent and Primary school teacher; Primary school teacher) Sales executive; Associate Professor (Housing Opportunities + Prevention of Eviction); [place] in [place]; List of Male Group Homes in [place]; Counselling psychologist (Housing and Teacher, music) Engineer, materials.  CSW also provided patient with the following link to try and obtain emergency financial assistance, as well as emergency housing assistance:  https://[place].com/.  In terms of financial assistance and food resources, CSW agreed to e-mail patient the following list of resources:  Tax adviser, through [employer] of the [place], Air cabin crew of [place]; Adult Medicaid Application, through the [place]; 2021 Medicaid Tips Checklist; Emergency Assistance in [place]; List of Financial Resources in [place]; [place] [employer]; [employer] in [place]; [employer] in [place].   Patient utilizes [employer] to get to and from his physician appointments, as well as to dialysis treatments on Monday's, Wednesday's and Friday's.  Patient reported that he is able to afford to pay for his prescription  medications and  takes his medications exactly as prescribed.  CSW agreed to follow-up with patient again next week, on Thursday, January 25, 2020, around 11:00am, to answer any questions that he may have pertaining to the information and resources received, as well as assist with application completion and submission, if necessary.  CSW was able to confirm receipt of the information, resources and applications e-mailed to him by CSW, prior to terminating the call.  CSW ensured that patient has the correct contact information for CSW, encouraging patient to contact CSW directly if additional social work needs arise in the meantime.  Paul [name], BSW, MSW, LCSW  Licensed Education officer, environmental Health System  Mailing [place] [address], [place], [place] [address] Physical Address-[address], [place], [place] [address] Toll Free Main # [phone] Fax # [phone] Cell # [phone]  [name]

## 2020-01-17 DIAGNOSIS — I255 Ischemic cardiomyopathy: Secondary | ICD-10-CM | POA: Diagnosis not present

## 2020-01-17 DIAGNOSIS — R52 Pain, unspecified: Secondary | ICD-10-CM | POA: Diagnosis not present

## 2020-01-17 DIAGNOSIS — Z20822 Contact with and (suspected) exposure to covid-19: Secondary | ICD-10-CM | POA: Diagnosis not present

## 2020-01-17 DIAGNOSIS — I2510 Atherosclerotic heart disease of native coronary artery without angina pectoris: Secondary | ICD-10-CM | POA: Diagnosis not present

## 2020-01-17 DIAGNOSIS — I454 Nonspecific intraventricular block: Secondary | ICD-10-CM | POA: Diagnosis not present

## 2020-01-17 DIAGNOSIS — L98492 Non-pressure chronic ulcer of skin of other sites with fat layer exposed: Secondary | ICD-10-CM | POA: Diagnosis not present

## 2020-01-17 DIAGNOSIS — D72829 Elevated white blood cell count, unspecified: Secondary | ICD-10-CM | POA: Diagnosis not present

## 2020-01-17 DIAGNOSIS — Z79899 Other long term (current) drug therapy: Secondary | ICD-10-CM | POA: Diagnosis not present

## 2020-01-17 DIAGNOSIS — E1165 Type 2 diabetes mellitus with hyperglycemia: Secondary | ICD-10-CM | POA: Diagnosis not present

## 2020-01-17 DIAGNOSIS — D631 Anemia in chronic kidney disease: Secondary | ICD-10-CM | POA: Diagnosis not present

## 2020-01-17 DIAGNOSIS — Z7409 Other reduced mobility: Secondary | ICD-10-CM | POA: Diagnosis not present

## 2020-01-17 DIAGNOSIS — Z950 Presence of cardiac pacemaker: Secondary | ICD-10-CM | POA: Diagnosis not present

## 2020-01-17 DIAGNOSIS — X58XXXS Exposure to other specified factors, sequela: Secondary | ICD-10-CM | POA: Diagnosis not present

## 2020-01-17 DIAGNOSIS — Z4502 Encounter for adjustment and management of automatic implantable cardiac defibrillator: Secondary | ICD-10-CM | POA: Diagnosis not present

## 2020-01-17 DIAGNOSIS — I5020 Unspecified systolic (congestive) heart failure: Secondary | ICD-10-CM | POA: Diagnosis not present

## 2020-01-17 DIAGNOSIS — Z95810 Presence of automatic (implantable) cardiac defibrillator: Secondary | ICD-10-CM | POA: Diagnosis not present

## 2020-01-17 DIAGNOSIS — I517 Cardiomegaly: Secondary | ICD-10-CM | POA: Diagnosis not present

## 2020-01-17 DIAGNOSIS — I472 Ventricular tachycardia: Secondary | ICD-10-CM | POA: Diagnosis not present

## 2020-01-17 DIAGNOSIS — E11649 Type 2 diabetes mellitus with hypoglycemia without coma: Secondary | ICD-10-CM | POA: Diagnosis not present

## 2020-01-17 DIAGNOSIS — R002 Palpitations: Secondary | ICD-10-CM | POA: Diagnosis not present

## 2020-01-17 DIAGNOSIS — I959 Hypotension, unspecified: Secondary | ICD-10-CM | POA: Diagnosis not present

## 2020-01-17 DIAGNOSIS — S68124A Partial traumatic metacarpophalangeal amputation of right ring finger, initial encounter: Secondary | ICD-10-CM | POA: Diagnosis not present

## 2020-01-17 DIAGNOSIS — I5022 Chronic systolic (congestive) heart failure: Secondary | ICD-10-CM | POA: Diagnosis not present

## 2020-01-17 DIAGNOSIS — R0602 Shortness of breath: Secondary | ICD-10-CM | POA: Diagnosis not present

## 2020-01-17 DIAGNOSIS — E1122 Type 2 diabetes mellitus with diabetic chronic kidney disease: Secondary | ICD-10-CM | POA: Diagnosis not present

## 2020-01-17 DIAGNOSIS — I429 Cardiomyopathy, unspecified: Secondary | ICD-10-CM | POA: Diagnosis not present

## 2020-01-17 DIAGNOSIS — Z992 Dependence on renal dialysis: Secondary | ICD-10-CM | POA: Diagnosis not present

## 2020-01-17 DIAGNOSIS — E1151 Type 2 diabetes mellitus with diabetic peripheral angiopathy without gangrene: Secondary | ICD-10-CM | POA: Diagnosis not present

## 2020-01-17 DIAGNOSIS — S68124S Partial traumatic metacarpophalangeal amputation of right ring finger, sequela: Secondary | ICD-10-CM | POA: Diagnosis not present

## 2020-01-17 DIAGNOSIS — N186 End stage renal disease: Secondary | ICD-10-CM | POA: Diagnosis not present

## 2020-01-17 DIAGNOSIS — T8781 Dehiscence of amputation stump: Secondary | ICD-10-CM | POA: Diagnosis not present

## 2020-01-17 DIAGNOSIS — I132 Hypertensive heart and chronic kidney disease with heart failure and with stage 5 chronic kidney disease, or end stage renal disease: Secondary | ICD-10-CM | POA: Diagnosis not present

## 2020-01-17 DIAGNOSIS — R0902 Hypoxemia: Secondary | ICD-10-CM | POA: Diagnosis not present

## 2020-01-18 ENCOUNTER — Ambulatory Visit: Payer: Self-pay

## 2020-01-18 ENCOUNTER — Ambulatory Visit: Payer: Self-pay | Admitting: *Deleted

## 2020-01-18 DIAGNOSIS — I5022 Chronic systolic (congestive) heart failure: Secondary | ICD-10-CM | POA: Diagnosis not present

## 2020-01-18 DIAGNOSIS — Z992 Dependence on renal dialysis: Secondary | ICD-10-CM | POA: Diagnosis not present

## 2020-01-18 DIAGNOSIS — E785 Hyperlipidemia, unspecified: Secondary | ICD-10-CM | POA: Diagnosis not present

## 2020-01-18 DIAGNOSIS — I252 Old myocardial infarction: Secondary | ICD-10-CM | POA: Diagnosis not present

## 2020-01-18 DIAGNOSIS — I255 Ischemic cardiomyopathy: Secondary | ICD-10-CM | POA: Diagnosis not present

## 2020-01-18 DIAGNOSIS — I132 Hypertensive heart and chronic kidney disease with heart failure and with stage 5 chronic kidney disease, or end stage renal disease: Secondary | ICD-10-CM | POA: Diagnosis not present

## 2020-01-18 DIAGNOSIS — I472 Ventricular tachycardia: Secondary | ICD-10-CM | POA: Diagnosis not present

## 2020-01-18 DIAGNOSIS — D631 Anemia in chronic kidney disease: Secondary | ICD-10-CM | POA: Diagnosis not present

## 2020-01-18 DIAGNOSIS — N186 End stage renal disease: Secondary | ICD-10-CM | POA: Diagnosis not present

## 2020-01-18 DIAGNOSIS — I2510 Atherosclerotic heart disease of native coronary artery without angina pectoris: Secondary | ICD-10-CM | POA: Diagnosis not present

## 2020-01-18 DIAGNOSIS — R002 Palpitations: Secondary | ICD-10-CM | POA: Diagnosis not present

## 2020-01-18 DIAGNOSIS — E1122 Type 2 diabetes mellitus with diabetic chronic kidney disease: Secondary | ICD-10-CM | POA: Diagnosis not present

## 2020-01-19 DIAGNOSIS — N186 End stage renal disease: Secondary | ICD-10-CM | POA: Diagnosis not present

## 2020-01-19 DIAGNOSIS — R002 Palpitations: Secondary | ICD-10-CM | POA: Diagnosis not present

## 2020-01-19 DIAGNOSIS — E1122 Type 2 diabetes mellitus with diabetic chronic kidney disease: Secondary | ICD-10-CM | POA: Diagnosis not present

## 2020-01-19 DIAGNOSIS — D631 Anemia in chronic kidney disease: Secondary | ICD-10-CM | POA: Diagnosis not present

## 2020-01-20 DIAGNOSIS — D631 Anemia in chronic kidney disease: Secondary | ICD-10-CM | POA: Diagnosis not present

## 2020-01-20 DIAGNOSIS — Z992 Dependence on renal dialysis: Secondary | ICD-10-CM | POA: Diagnosis not present

## 2020-01-20 DIAGNOSIS — N186 End stage renal disease: Secondary | ICD-10-CM | POA: Diagnosis not present

## 2020-01-21 DIAGNOSIS — N186 End stage renal disease: Secondary | ICD-10-CM | POA: Diagnosis not present

## 2020-01-21 DIAGNOSIS — Z992 Dependence on renal dialysis: Secondary | ICD-10-CM | POA: Diagnosis not present

## 2020-01-21 DIAGNOSIS — D631 Anemia in chronic kidney disease: Secondary | ICD-10-CM | POA: Diagnosis not present

## 2020-01-22 DIAGNOSIS — I132 Hypertensive heart and chronic kidney disease with heart failure and with stage 5 chronic kidney disease, or end stage renal disease: Secondary | ICD-10-CM | POA: Diagnosis not present

## 2020-01-22 DIAGNOSIS — D631 Anemia in chronic kidney disease: Secondary | ICD-10-CM | POA: Diagnosis not present

## 2020-01-22 DIAGNOSIS — N186 End stage renal disease: Secondary | ICD-10-CM | POA: Diagnosis not present

## 2020-01-22 DIAGNOSIS — I120 Hypertensive chronic kidney disease with stage 5 chronic kidney disease or end stage renal disease: Secondary | ICD-10-CM | POA: Diagnosis not present

## 2020-01-22 DIAGNOSIS — Z992 Dependence on renal dialysis: Secondary | ICD-10-CM | POA: Diagnosis not present

## 2020-01-22 DIAGNOSIS — L98492 Non-pressure chronic ulcer of skin of other sites with fat layer exposed: Secondary | ICD-10-CM | POA: Diagnosis not present

## 2020-01-22 DIAGNOSIS — I5020 Unspecified systolic (congestive) heart failure: Secondary | ICD-10-CM | POA: Diagnosis not present

## 2020-01-22 DIAGNOSIS — E1151 Type 2 diabetes mellitus with diabetic peripheral angiopathy without gangrene: Secondary | ICD-10-CM | POA: Diagnosis not present

## 2020-01-22 DIAGNOSIS — E1122 Type 2 diabetes mellitus with diabetic chronic kidney disease: Secondary | ICD-10-CM | POA: Diagnosis not present

## 2020-01-23 ENCOUNTER — Ambulatory Visit: Payer: Self-pay | Admitting: *Deleted

## 2020-01-23 DIAGNOSIS — Z95810 Presence of automatic (implantable) cardiac defibrillator: Secondary | ICD-10-CM | POA: Diagnosis not present

## 2020-01-23 DIAGNOSIS — I4510 Unspecified right bundle-branch block: Secondary | ICD-10-CM | POA: Diagnosis not present

## 2020-01-23 DIAGNOSIS — D631 Anemia in chronic kidney disease: Secondary | ICD-10-CM | POA: Diagnosis not present

## 2020-01-23 DIAGNOSIS — I132 Hypertensive heart and chronic kidney disease with heart failure and with stage 5 chronic kidney disease, or end stage renal disease: Secondary | ICD-10-CM | POA: Diagnosis not present

## 2020-01-23 DIAGNOSIS — Z89411 Acquired absence of right great toe: Secondary | ICD-10-CM | POA: Diagnosis not present

## 2020-01-23 DIAGNOSIS — I120 Hypertensive chronic kidney disease with stage 5 chronic kidney disease or end stage renal disease: Secondary | ICD-10-CM | POA: Diagnosis not present

## 2020-01-23 DIAGNOSIS — I255 Ischemic cardiomyopathy: Secondary | ICD-10-CM | POA: Diagnosis not present

## 2020-01-23 DIAGNOSIS — Z951 Presence of aortocoronary bypass graft: Secondary | ICD-10-CM | POA: Diagnosis not present

## 2020-01-23 DIAGNOSIS — E1122 Type 2 diabetes mellitus with diabetic chronic kidney disease: Secondary | ICD-10-CM | POA: Diagnosis not present

## 2020-01-23 DIAGNOSIS — Z992 Dependence on renal dialysis: Secondary | ICD-10-CM | POA: Diagnosis not present

## 2020-01-23 DIAGNOSIS — I472 Ventricular tachycardia: Secondary | ICD-10-CM | POA: Diagnosis not present

## 2020-01-23 DIAGNOSIS — I495 Sick sinus syndrome: Secondary | ICD-10-CM | POA: Diagnosis not present

## 2020-01-23 DIAGNOSIS — I479 Paroxysmal tachycardia, unspecified: Secondary | ICD-10-CM | POA: Diagnosis not present

## 2020-01-23 DIAGNOSIS — I5020 Unspecified systolic (congestive) heart failure: Secondary | ICD-10-CM | POA: Diagnosis not present

## 2020-01-23 DIAGNOSIS — I501 Left ventricular failure: Secondary | ICD-10-CM | POA: Diagnosis not present

## 2020-01-23 DIAGNOSIS — N186 End stage renal disease: Secondary | ICD-10-CM | POA: Diagnosis not present

## 2020-01-24 ENCOUNTER — Other Ambulatory Visit: Payer: Self-pay

## 2020-01-24 DIAGNOSIS — D649 Anemia, unspecified: Secondary | ICD-10-CM | POA: Diagnosis not present

## 2020-01-24 DIAGNOSIS — Z89511 Acquired absence of right leg below knee: Secondary | ICD-10-CM | POA: Diagnosis not present

## 2020-01-24 DIAGNOSIS — I132 Hypertensive heart and chronic kidney disease with heart failure and with stage 5 chronic kidney disease, or end stage renal disease: Secondary | ICD-10-CM | POA: Diagnosis not present

## 2020-01-24 DIAGNOSIS — I5022 Chronic systolic (congestive) heart failure: Secondary | ICD-10-CM | POA: Diagnosis not present

## 2020-01-24 DIAGNOSIS — L98499 Non-pressure chronic ulcer of skin of other sites with unspecified severity: Secondary | ICD-10-CM | POA: Diagnosis not present

## 2020-01-24 DIAGNOSIS — I5042 Chronic combined systolic (congestive) and diastolic (congestive) heart failure: Secondary | ICD-10-CM | POA: Diagnosis not present

## 2020-01-24 DIAGNOSIS — S88111A Complete traumatic amputation at level between knee and ankle, right lower leg, initial encounter: Secondary | ICD-10-CM | POA: Diagnosis not present

## 2020-01-24 DIAGNOSIS — T8781 Dehiscence of amputation stump: Secondary | ICD-10-CM | POA: Diagnosis not present

## 2020-01-24 DIAGNOSIS — I959 Hypotension, unspecified: Secondary | ICD-10-CM | POA: Diagnosis not present

## 2020-01-24 DIAGNOSIS — N186 End stage renal disease: Secondary | ICD-10-CM | POA: Diagnosis not present

## 2020-01-24 DIAGNOSIS — S68119S Complete traumatic metacarpophalangeal amputation of unspecified finger, sequela: Secondary | ICD-10-CM | POA: Diagnosis not present

## 2020-01-24 DIAGNOSIS — M2550 Pain in unspecified joint: Secondary | ICD-10-CM | POA: Diagnosis not present

## 2020-01-24 DIAGNOSIS — I255 Ischemic cardiomyopathy: Secondary | ICD-10-CM | POA: Diagnosis not present

## 2020-01-24 DIAGNOSIS — Z7401 Bed confinement status: Secondary | ICD-10-CM | POA: Diagnosis not present

## 2020-01-24 DIAGNOSIS — R002 Palpitations: Secondary | ICD-10-CM | POA: Diagnosis not present

## 2020-01-24 DIAGNOSIS — T82897D Other specified complication of cardiac prosthetic devices, implants and grafts, subsequent encounter: Secondary | ICD-10-CM | POA: Diagnosis not present

## 2020-01-24 DIAGNOSIS — Z9889 Other specified postprocedural states: Secondary | ICD-10-CM | POA: Diagnosis not present

## 2020-01-24 DIAGNOSIS — R600 Localized edema: Secondary | ICD-10-CM | POA: Diagnosis not present

## 2020-01-24 DIAGNOSIS — M6281 Muscle weakness (generalized): Secondary | ICD-10-CM | POA: Diagnosis not present

## 2020-01-24 DIAGNOSIS — R278 Other lack of coordination: Secondary | ICD-10-CM | POA: Diagnosis not present

## 2020-01-24 DIAGNOSIS — I679 Cerebrovascular disease, unspecified: Secondary | ICD-10-CM | POA: Diagnosis not present

## 2020-01-24 DIAGNOSIS — K5901 Slow transit constipation: Secondary | ICD-10-CM | POA: Diagnosis not present

## 2020-01-24 DIAGNOSIS — I120 Hypertensive chronic kidney disease with stage 5 chronic kidney disease or end stage renal disease: Secondary | ICD-10-CM | POA: Diagnosis not present

## 2020-01-24 DIAGNOSIS — Z992 Dependence on renal dialysis: Secondary | ICD-10-CM | POA: Diagnosis not present

## 2020-01-24 DIAGNOSIS — S68614A Complete traumatic transphalangeal amputation of right ring finger, initial encounter: Secondary | ICD-10-CM | POA: Diagnosis not present

## 2020-01-24 DIAGNOSIS — I5020 Unspecified systolic (congestive) heart failure: Secondary | ICD-10-CM | POA: Diagnosis not present

## 2020-01-24 DIAGNOSIS — E8770 Fluid overload, unspecified: Secondary | ICD-10-CM | POA: Diagnosis not present

## 2020-01-24 DIAGNOSIS — D509 Iron deficiency anemia, unspecified: Secondary | ICD-10-CM | POA: Diagnosis not present

## 2020-01-24 DIAGNOSIS — I739 Peripheral vascular disease, unspecified: Secondary | ICD-10-CM | POA: Diagnosis not present

## 2020-01-24 DIAGNOSIS — D631 Anemia in chronic kidney disease: Secondary | ICD-10-CM | POA: Diagnosis not present

## 2020-01-24 DIAGNOSIS — Z79899 Other long term (current) drug therapy: Secondary | ICD-10-CM | POA: Diagnosis not present

## 2020-01-24 DIAGNOSIS — E1151 Type 2 diabetes mellitus with diabetic peripheral angiopathy without gangrene: Secondary | ICD-10-CM | POA: Diagnosis not present

## 2020-01-24 DIAGNOSIS — N2581 Secondary hyperparathyroidism of renal origin: Secondary | ICD-10-CM | POA: Diagnosis not present

## 2020-01-24 DIAGNOSIS — R2689 Other abnormalities of gait and mobility: Secondary | ICD-10-CM | POA: Diagnosis not present

## 2020-01-24 DIAGNOSIS — I96 Gangrene, not elsewhere classified: Secondary | ICD-10-CM | POA: Diagnosis not present

## 2020-01-24 DIAGNOSIS — N184 Chronic kidney disease, stage 4 (severe): Secondary | ICD-10-CM | POA: Diagnosis not present

## 2020-01-24 DIAGNOSIS — M5136 Other intervertebral disc degeneration, lumbar region: Secondary | ICD-10-CM | POA: Diagnosis not present

## 2020-01-24 DIAGNOSIS — B3789 Other sites of candidiasis: Secondary | ICD-10-CM | POA: Diagnosis not present

## 2020-01-24 DIAGNOSIS — S61001D Unspecified open wound of right thumb without damage to nail, subsequent encounter: Secondary | ICD-10-CM | POA: Diagnosis not present

## 2020-01-24 DIAGNOSIS — E1122 Type 2 diabetes mellitus with diabetic chronic kidney disease: Secondary | ICD-10-CM | POA: Diagnosis not present

## 2020-01-24 DIAGNOSIS — R5381 Other malaise: Secondary | ICD-10-CM | POA: Diagnosis not present

## 2020-01-24 DIAGNOSIS — Z95810 Presence of automatic (implantable) cardiac defibrillator: Secondary | ICD-10-CM | POA: Diagnosis not present

## 2020-01-24 DIAGNOSIS — I472 Ventricular tachycardia: Secondary | ICD-10-CM | POA: Diagnosis not present

## 2020-01-24 DIAGNOSIS — E119 Type 2 diabetes mellitus without complications: Secondary | ICD-10-CM | POA: Diagnosis not present

## 2020-01-24 DIAGNOSIS — R531 Weakness: Secondary | ICD-10-CM | POA: Diagnosis not present

## 2020-01-24 NOTE — Patient Outreach (Signed)
[facility]) Care Management  01/24/2020  Paul [name]. 1966/02/19 [id number]   Transition Of Care    Upon chart review noted that patient admitted to the hospital on 01/17/20.    Plan; RN CM will continue to monitor for discharge plans and disposition.   [name], RN,BSN,CCM [facility] Telephonic Care Management Coordinator Direct Phone: [phone] Toll Free: [phone] Fax: [phone]'

## 2020-01-25 ENCOUNTER — Other Ambulatory Visit: Payer: Self-pay | Admitting: *Deleted

## 2020-01-25 ENCOUNTER — Encounter: Payer: Self-pay | Admitting: *Deleted

## 2020-01-25 DIAGNOSIS — N186 End stage renal disease: Secondary | ICD-10-CM | POA: Diagnosis not present

## 2020-01-25 DIAGNOSIS — N2581 Secondary hyperparathyroidism of renal origin: Secondary | ICD-10-CM | POA: Diagnosis not present

## 2020-01-25 DIAGNOSIS — R002 Palpitations: Secondary | ICD-10-CM | POA: Diagnosis not present

## 2020-01-25 DIAGNOSIS — R5381 Other malaise: Secondary | ICD-10-CM | POA: Diagnosis not present

## 2020-01-25 DIAGNOSIS — E8770 Fluid overload, unspecified: Secondary | ICD-10-CM | POA: Diagnosis not present

## 2020-01-25 DIAGNOSIS — D631 Anemia in chronic kidney disease: Secondary | ICD-10-CM | POA: Diagnosis not present

## 2020-01-25 NOTE — Patient Outreach (Signed)
[facility]) Care Management  01/25/2020  Paul [name]. [birth year] [id number]   CSW made an attempt to try and contact patient today to follow-up regarding social work services and resources, as well as to ensure that patient received the resource information and applications that CSW emailed to him last week, on Tuesday, January 16, 2020.  However, patient was unavailable at the time of CSW's call.  CSW left a HIPAA compliant message for patient on voicemail and is currently awaiting a return call.  CSW will make a second outreach attempt within the next 3-4 business days, if a return call is not received from patient in the meantime.  CSW will also mail a Patient Unsuccessful Outreach Letter to patient's home, requesting that he contact CSW directly, if he is interested in receiving social work services through [place] with [facility].  Nat Christen, BSW, MSW, LCSW  Licensed Education officer, environmental Health System  Mailing [place] [address] [place], [place] [address] Physical Address-[address], [place], [place] [address] Toll Free Main # [phone] Fax # [phone] Cell # [phone]  [email or web address]

## 2020-01-25 NOTE — Patient Outreach (Signed)
[name]) Care Management  01/25/2020  Paul [name]. 10-08-1965 [id number]   CSW received a return call from patient today, in response to the HIPAA compliant message left on voicemail for patient by CSW, just moments prior.  Patient admitted that he is doing well, currently residing in a skilled nursing facility to receive short-term rehabilitative services.  Patient confirmed receipt of the resource information and applications that CSW e-mailed to him last week, on Tuesday, September 14th.  Patient reported that he is currently working with the Veterinary surgeon at [employer] to pursue permanent housing arrangements.  Patient denied the need for continued social work involvement.    CSW will perform a case closure on patient, as all goals of treatment have been met from social work standpoint and no additional social work needs have been identified at this time.  CSW will notify patient's Telephonic RNCM with [facility], [name] of CSW's plans to close patient's case.  CSW will fax an update to patient's Primary Care Physician, Dr. Tracie Harrier to ensure that they are aware of CSW's involvement with patient's plan of care, as well as route a Physician Case Closure Letter.  CSW was able to confirm that patient has the correct contact information for CSW, encouraging him to contact CSW directly if additional social work needs arise in the near future.     Nat Christen, BSW, MSW, LCSW  Licensed Education officer, environmental Health System  Mailing [place] [address] [place], [place] [address] Physical Address-[address], [place], [place] [address] Toll Free Main # [phone] Fax # [phone] Cell # [phone]  [email or web address]

## 2020-01-27 DIAGNOSIS — D631 Anemia in chronic kidney disease: Secondary | ICD-10-CM | POA: Diagnosis not present

## 2020-01-27 DIAGNOSIS — N186 End stage renal disease: Secondary | ICD-10-CM | POA: Diagnosis not present

## 2020-01-27 DIAGNOSIS — N2581 Secondary hyperparathyroidism of renal origin: Secondary | ICD-10-CM | POA: Diagnosis not present

## 2020-01-29 DIAGNOSIS — S61001D Unspecified open wound of right thumb without damage to nail, subsequent encounter: Secondary | ICD-10-CM | POA: Diagnosis not present

## 2020-01-29 DIAGNOSIS — I96 Gangrene, not elsewhere classified: Secondary | ICD-10-CM | POA: Diagnosis not present

## 2020-01-29 DIAGNOSIS — Z992 Dependence on renal dialysis: Secondary | ICD-10-CM | POA: Diagnosis not present

## 2020-01-29 DIAGNOSIS — D649 Anemia, unspecified: Secondary | ICD-10-CM | POA: Diagnosis not present

## 2020-01-29 DIAGNOSIS — Z9889 Other specified postprocedural states: Secondary | ICD-10-CM | POA: Diagnosis not present

## 2020-01-29 DIAGNOSIS — N186 End stage renal disease: Secondary | ICD-10-CM | POA: Diagnosis not present

## 2020-01-29 DIAGNOSIS — B3789 Other sites of candidiasis: Secondary | ICD-10-CM | POA: Diagnosis not present

## 2020-01-30 ENCOUNTER — Other Ambulatory Visit: Payer: Self-pay

## 2020-01-30 DIAGNOSIS — D631 Anemia in chronic kidney disease: Secondary | ICD-10-CM | POA: Diagnosis not present

## 2020-01-30 DIAGNOSIS — N2581 Secondary hyperparathyroidism of renal origin: Secondary | ICD-10-CM | POA: Diagnosis not present

## 2020-01-30 DIAGNOSIS — N186 End stage renal disease: Secondary | ICD-10-CM | POA: Diagnosis not present

## 2020-01-30 NOTE — Patient Outreach (Signed)
[facility]) Care Management  01/30/2020  Paul [name]. 1965-05-07 [id number]   Telephone Assessment    Outreach attempt # 1 to patient. No answer after several rings and unable to leave message.     Plan: RN CM will make outreach attempt to patient within 3-4 business days.  [name], RN,BSN,CCM [facility] Telephonic Care Management Coordinator Direct Phone: [phone] Toll Free: [phone] Fax: [phone]

## 2020-02-01 ENCOUNTER — Other Ambulatory Visit: Payer: Self-pay

## 2020-02-01 DIAGNOSIS — N186 End stage renal disease: Secondary | ICD-10-CM | POA: Diagnosis not present

## 2020-02-01 DIAGNOSIS — N2581 Secondary hyperparathyroidism of renal origin: Secondary | ICD-10-CM | POA: Diagnosis not present

## 2020-02-01 DIAGNOSIS — Z992 Dependence on renal dialysis: Secondary | ICD-10-CM | POA: Diagnosis not present

## 2020-02-01 DIAGNOSIS — D631 Anemia in chronic kidney disease: Secondary | ICD-10-CM | POA: Diagnosis not present

## 2020-02-01 NOTE — Patient Outreach (Signed)
[place]) Care Management  02/01/2020  Paul [name]. May 01, 1966 [id number]   Telephone Assessment    Outreach attempt #2 to patient. Spoke with patient who reports he is doing well. He shares that he was recently in the hospital related to cardiac issues. He is doing better. He is currently at rehab facility. He is unsure of how long he will stay but reports he was possibly told he would be there for about a month. He is pleased to report that him and his sister have worked out a housing situation and plan to move in together in [place] once he is discharged. Advised patient that RN CM would follow up upon discharge. He voiced understanding and was appreciative of call.      Plan: RN CM will continue to monitor for discharge plans/disposition.  [name], RN,BSN,CCM [facility] Telephonic Care Management Coordinator Direct Phone: [phone] Toll Free: [phone] Fax: [phone]

## 2020-02-03 DIAGNOSIS — D631 Anemia in chronic kidney disease: Secondary | ICD-10-CM | POA: Diagnosis not present

## 2020-02-03 DIAGNOSIS — E119 Type 2 diabetes mellitus without complications: Secondary | ICD-10-CM | POA: Diagnosis not present

## 2020-02-03 DIAGNOSIS — N186 End stage renal disease: Secondary | ICD-10-CM | POA: Diagnosis not present

## 2020-02-03 DIAGNOSIS — E8770 Fluid overload, unspecified: Secondary | ICD-10-CM | POA: Diagnosis not present

## 2020-02-03 DIAGNOSIS — D509 Iron deficiency anemia, unspecified: Secondary | ICD-10-CM | POA: Diagnosis not present

## 2020-02-03 DIAGNOSIS — N2581 Secondary hyperparathyroidism of renal origin: Secondary | ICD-10-CM | POA: Diagnosis not present

## 2020-02-05 DIAGNOSIS — E119 Type 2 diabetes mellitus without complications: Secondary | ICD-10-CM | POA: Diagnosis not present

## 2020-02-05 DIAGNOSIS — N186 End stage renal disease: Secondary | ICD-10-CM | POA: Diagnosis not present

## 2020-02-05 DIAGNOSIS — K5901 Slow transit constipation: Secondary | ICD-10-CM | POA: Diagnosis not present

## 2020-02-05 DIAGNOSIS — I472 Ventricular tachycardia: Secondary | ICD-10-CM | POA: Diagnosis not present

## 2020-02-06 DIAGNOSIS — N186 End stage renal disease: Secondary | ICD-10-CM | POA: Diagnosis not present

## 2020-02-06 DIAGNOSIS — D631 Anemia in chronic kidney disease: Secondary | ICD-10-CM | POA: Diagnosis not present

## 2020-02-06 DIAGNOSIS — D509 Iron deficiency anemia, unspecified: Secondary | ICD-10-CM | POA: Diagnosis not present

## 2020-02-06 DIAGNOSIS — N2581 Secondary hyperparathyroidism of renal origin: Secondary | ICD-10-CM | POA: Diagnosis not present

## 2020-02-07 DIAGNOSIS — Z89511 Acquired absence of right leg below knee: Secondary | ICD-10-CM | POA: Diagnosis not present

## 2020-02-07 DIAGNOSIS — M6281 Muscle weakness (generalized): Secondary | ICD-10-CM | POA: Diagnosis not present

## 2020-02-07 DIAGNOSIS — I739 Peripheral vascular disease, unspecified: Secondary | ICD-10-CM | POA: Diagnosis not present

## 2020-02-07 DIAGNOSIS — L98499 Non-pressure chronic ulcer of skin of other sites with unspecified severity: Secondary | ICD-10-CM | POA: Diagnosis not present

## 2020-02-08 DIAGNOSIS — N186 End stage renal disease: Secondary | ICD-10-CM | POA: Diagnosis not present

## 2020-02-08 DIAGNOSIS — D509 Iron deficiency anemia, unspecified: Secondary | ICD-10-CM | POA: Diagnosis not present

## 2020-02-08 DIAGNOSIS — N2581 Secondary hyperparathyroidism of renal origin: Secondary | ICD-10-CM | POA: Diagnosis not present

## 2020-02-08 DIAGNOSIS — D631 Anemia in chronic kidney disease: Secondary | ICD-10-CM | POA: Diagnosis not present

## 2020-02-10 DIAGNOSIS — D631 Anemia in chronic kidney disease: Secondary | ICD-10-CM | POA: Diagnosis not present

## 2020-02-10 DIAGNOSIS — N186 End stage renal disease: Secondary | ICD-10-CM | POA: Diagnosis not present

## 2020-02-10 DIAGNOSIS — D509 Iron deficiency anemia, unspecified: Secondary | ICD-10-CM | POA: Diagnosis not present

## 2020-02-10 DIAGNOSIS — N2581 Secondary hyperparathyroidism of renal origin: Secondary | ICD-10-CM | POA: Diagnosis not present

## 2020-02-12 DIAGNOSIS — R6889 Other general symptoms and signs: Secondary | ICD-10-CM | POA: Diagnosis not present

## 2020-02-12 DIAGNOSIS — N186 End stage renal disease: Secondary | ICD-10-CM | POA: Diagnosis not present

## 2020-02-12 DIAGNOSIS — M109 Gout, unspecified: Secondary | ICD-10-CM | POA: Diagnosis not present

## 2020-02-13 DIAGNOSIS — N2581 Secondary hyperparathyroidism of renal origin: Secondary | ICD-10-CM | POA: Diagnosis not present

## 2020-02-13 DIAGNOSIS — D509 Iron deficiency anemia, unspecified: Secondary | ICD-10-CM | POA: Diagnosis not present

## 2020-02-13 DIAGNOSIS — N186 End stage renal disease: Secondary | ICD-10-CM | POA: Diagnosis not present

## 2020-02-13 DIAGNOSIS — D631 Anemia in chronic kidney disease: Secondary | ICD-10-CM | POA: Diagnosis not present

## 2020-02-14 DIAGNOSIS — M6281 Muscle weakness (generalized): Secondary | ICD-10-CM | POA: Diagnosis not present

## 2020-02-14 DIAGNOSIS — Z89511 Acquired absence of right leg below knee: Secondary | ICD-10-CM | POA: Diagnosis not present

## 2020-02-14 DIAGNOSIS — L98499 Non-pressure chronic ulcer of skin of other sites with unspecified severity: Secondary | ICD-10-CM | POA: Diagnosis not present

## 2020-02-14 DIAGNOSIS — I739 Peripheral vascular disease, unspecified: Secondary | ICD-10-CM | POA: Diagnosis not present

## 2020-02-15 DIAGNOSIS — E119 Type 2 diabetes mellitus without complications: Secondary | ICD-10-CM | POA: Diagnosis not present

## 2020-02-15 DIAGNOSIS — I429 Cardiomyopathy, unspecified: Secondary | ICD-10-CM | POA: Diagnosis not present

## 2020-02-15 DIAGNOSIS — E785 Hyperlipidemia, unspecified: Secondary | ICD-10-CM | POA: Diagnosis not present

## 2020-02-15 DIAGNOSIS — N186 End stage renal disease: Secondary | ICD-10-CM | POA: Diagnosis not present

## 2020-02-17 DIAGNOSIS — N186 End stage renal disease: Secondary | ICD-10-CM | POA: Diagnosis not present

## 2020-02-17 DIAGNOSIS — N2581 Secondary hyperparathyroidism of renal origin: Secondary | ICD-10-CM | POA: Diagnosis not present

## 2020-02-17 DIAGNOSIS — D509 Iron deficiency anemia, unspecified: Secondary | ICD-10-CM | POA: Diagnosis not present

## 2020-02-17 DIAGNOSIS — D631 Anemia in chronic kidney disease: Secondary | ICD-10-CM | POA: Diagnosis not present

## 2020-02-19 ENCOUNTER — Other Ambulatory Visit: Payer: Self-pay

## 2020-02-19 NOTE — Patient Outreach (Signed)
[facility]) Care Management  02/19/2020  Baylen Buckner. 14-Sep-1965 [id number]   Telephone Assessment    RN CM spoke with patient who reported that he was still at SNF/rehab. He is unsure of how much longer he will remain there. He reports case worker is working on getting hi housing as weil as Psychologist, occupational. He voices that he still wants to go live with his sister at time of discharge. He states that his and is healing and no s/s of infection.     Plan: RN CM will continue to monitor/follow for discharge plans/disposition.   [name], RN,BSN,CCM [facility] Telephonic Care Management Coordinator Direct Phone: [phone] Toll Free: [phone] Fax: [phone]

## 2020-02-20 DIAGNOSIS — I96 Gangrene, not elsewhere classified: Secondary | ICD-10-CM | POA: Diagnosis not present

## 2020-02-20 DIAGNOSIS — Z4781 Encounter for orthopedic aftercare following surgical amputation: Secondary | ICD-10-CM | POA: Diagnosis not present

## 2020-02-20 DIAGNOSIS — Z89021 Acquired absence of right finger(s): Secondary | ICD-10-CM | POA: Diagnosis not present

## 2020-02-21 DIAGNOSIS — L98499 Non-pressure chronic ulcer of skin of other sites with unspecified severity: Secondary | ICD-10-CM | POA: Diagnosis not present

## 2020-02-21 DIAGNOSIS — Z89511 Acquired absence of right leg below knee: Secondary | ICD-10-CM | POA: Diagnosis not present

## 2020-02-21 DIAGNOSIS — R6889 Other general symptoms and signs: Secondary | ICD-10-CM | POA: Diagnosis not present

## 2020-02-21 DIAGNOSIS — M6281 Muscle weakness (generalized): Secondary | ICD-10-CM | POA: Diagnosis not present

## 2020-02-21 DIAGNOSIS — I739 Peripheral vascular disease, unspecified: Secondary | ICD-10-CM | POA: Diagnosis not present

## 2020-02-22 DIAGNOSIS — S61204A Unspecified open wound of right ring finger without damage to nail, initial encounter: Secondary | ICD-10-CM | POA: Diagnosis not present

## 2020-02-22 DIAGNOSIS — R000 Tachycardia, unspecified: Secondary | ICD-10-CM | POA: Diagnosis not present

## 2020-02-22 DIAGNOSIS — D509 Iron deficiency anemia, unspecified: Secondary | ICD-10-CM | POA: Diagnosis not present

## 2020-02-22 DIAGNOSIS — N2581 Secondary hyperparathyroidism of renal origin: Secondary | ICD-10-CM | POA: Diagnosis not present

## 2020-02-22 DIAGNOSIS — E119 Type 2 diabetes mellitus without complications: Secondary | ICD-10-CM | POA: Diagnosis not present

## 2020-02-22 DIAGNOSIS — N186 End stage renal disease: Secondary | ICD-10-CM | POA: Diagnosis not present

## 2020-02-22 DIAGNOSIS — D631 Anemia in chronic kidney disease: Secondary | ICD-10-CM | POA: Diagnosis not present

## 2020-02-24 DIAGNOSIS — N2581 Secondary hyperparathyroidism of renal origin: Secondary | ICD-10-CM | POA: Diagnosis not present

## 2020-02-24 DIAGNOSIS — N186 End stage renal disease: Secondary | ICD-10-CM | POA: Diagnosis not present

## 2020-02-24 DIAGNOSIS — D509 Iron deficiency anemia, unspecified: Secondary | ICD-10-CM | POA: Diagnosis not present

## 2020-02-24 DIAGNOSIS — D631 Anemia in chronic kidney disease: Secondary | ICD-10-CM | POA: Diagnosis not present

## 2020-02-27 DIAGNOSIS — D631 Anemia in chronic kidney disease: Secondary | ICD-10-CM | POA: Diagnosis not present

## 2020-02-27 DIAGNOSIS — N186 End stage renal disease: Secondary | ICD-10-CM | POA: Diagnosis not present

## 2020-02-27 DIAGNOSIS — D509 Iron deficiency anemia, unspecified: Secondary | ICD-10-CM | POA: Diagnosis not present

## 2020-02-27 DIAGNOSIS — N2581 Secondary hyperparathyroidism of renal origin: Secondary | ICD-10-CM | POA: Diagnosis not present

## 2020-02-28 DIAGNOSIS — Z89511 Acquired absence of right leg below knee: Secondary | ICD-10-CM | POA: Diagnosis not present

## 2020-02-28 DIAGNOSIS — L98499 Non-pressure chronic ulcer of skin of other sites with unspecified severity: Secondary | ICD-10-CM | POA: Diagnosis not present

## 2020-02-28 DIAGNOSIS — M6281 Muscle weakness (generalized): Secondary | ICD-10-CM | POA: Diagnosis not present

## 2020-02-28 DIAGNOSIS — I739 Peripheral vascular disease, unspecified: Secondary | ICD-10-CM | POA: Diagnosis not present

## 2020-02-29 DIAGNOSIS — N2581 Secondary hyperparathyroidism of renal origin: Secondary | ICD-10-CM | POA: Diagnosis not present

## 2020-02-29 DIAGNOSIS — S88111A Complete traumatic amputation at level between knee and ankle, right lower leg, initial encounter: Secondary | ICD-10-CM | POA: Diagnosis not present

## 2020-02-29 DIAGNOSIS — D631 Anemia in chronic kidney disease: Secondary | ICD-10-CM | POA: Diagnosis not present

## 2020-02-29 DIAGNOSIS — I679 Cerebrovascular disease, unspecified: Secondary | ICD-10-CM | POA: Diagnosis not present

## 2020-02-29 DIAGNOSIS — R600 Localized edema: Secondary | ICD-10-CM | POA: Diagnosis not present

## 2020-02-29 DIAGNOSIS — D509 Iron deficiency anemia, unspecified: Secondary | ICD-10-CM | POA: Diagnosis not present

## 2020-02-29 DIAGNOSIS — N184 Chronic kidney disease, stage 4 (severe): Secondary | ICD-10-CM | POA: Diagnosis not present

## 2020-02-29 DIAGNOSIS — M5136 Other intervertebral disc degeneration, lumbar region: Secondary | ICD-10-CM | POA: Diagnosis not present

## 2020-02-29 DIAGNOSIS — I5042 Chronic combined systolic (congestive) and diastolic (congestive) heart failure: Secondary | ICD-10-CM | POA: Diagnosis not present

## 2020-02-29 DIAGNOSIS — N186 End stage renal disease: Secondary | ICD-10-CM | POA: Diagnosis not present

## 2020-03-02 DIAGNOSIS — D631 Anemia in chronic kidney disease: Secondary | ICD-10-CM | POA: Diagnosis not present

## 2020-03-02 DIAGNOSIS — N186 End stage renal disease: Secondary | ICD-10-CM | POA: Diagnosis not present

## 2020-03-02 DIAGNOSIS — D509 Iron deficiency anemia, unspecified: Secondary | ICD-10-CM | POA: Diagnosis not present

## 2020-03-02 DIAGNOSIS — N2581 Secondary hyperparathyroidism of renal origin: Secondary | ICD-10-CM | POA: Diagnosis not present

## 2020-03-03 DIAGNOSIS — Z992 Dependence on renal dialysis: Secondary | ICD-10-CM | POA: Diagnosis not present

## 2020-03-03 DIAGNOSIS — N186 End stage renal disease: Secondary | ICD-10-CM | POA: Diagnosis not present

## 2020-03-04 DIAGNOSIS — Z888 Allergy status to other drugs, medicaments and biological substances status: Secondary | ICD-10-CM | POA: Diagnosis not present

## 2020-03-04 DIAGNOSIS — Z9889 Other specified postprocedural states: Secondary | ICD-10-CM | POA: Diagnosis not present

## 2020-03-04 DIAGNOSIS — N186 End stage renal disease: Secondary | ICD-10-CM | POA: Diagnosis not present

## 2020-03-04 DIAGNOSIS — M67441 Ganglion, right hand: Secondary | ICD-10-CM | POA: Diagnosis not present

## 2020-03-04 DIAGNOSIS — Z886 Allergy status to analgesic agent status: Secondary | ICD-10-CM | POA: Diagnosis not present

## 2020-03-04 DIAGNOSIS — Z992 Dependence on renal dialysis: Secondary | ICD-10-CM | POA: Diagnosis not present

## 2020-03-05 DIAGNOSIS — R0602 Shortness of breath: Secondary | ICD-10-CM | POA: Diagnosis not present

## 2020-03-05 DIAGNOSIS — R4020 Unspecified coma: Secondary | ICD-10-CM | POA: Diagnosis not present

## 2020-03-05 DIAGNOSIS — R069 Unspecified abnormalities of breathing: Secondary | ICD-10-CM | POA: Diagnosis not present

## 2020-03-05 DIAGNOSIS — N186 End stage renal disease: Secondary | ICD-10-CM | POA: Diagnosis not present

## 2020-03-05 DIAGNOSIS — R404 Transient alteration of awareness: Secondary | ICD-10-CM | POA: Diagnosis not present

## 2020-03-05 DIAGNOSIS — I499 Cardiac arrhythmia, unspecified: Secondary | ICD-10-CM | POA: Diagnosis not present

## 2020-03-05 DIAGNOSIS — I469 Cardiac arrest, cause unspecified: Secondary | ICD-10-CM | POA: Diagnosis not present

## 2020-03-06 DIAGNOSIS — L98492 Non-pressure chronic ulcer of skin of other sites with fat layer exposed: Secondary | ICD-10-CM | POA: Diagnosis not present

## 2020-03-12 ENCOUNTER — Other Ambulatory Visit: Payer: Self-pay

## 2020-03-12 NOTE — Patient Outreach (Signed)
[name]) Care Management  03/12/2020  Paul [name]. Feb 04, 1966 [id number]   Case Closure   RN CM received notification that patient expired on 2020/03/31.     Plan: RN CM will close case at this time.   [name], RN,BSN,CCM [facility] Telephonic Care Management Coordinator Direct Phone: [phone] Toll Free: [phone] Fax: [phone]

## 2020-03-31 DIAGNOSIS — M5136 Other intervertebral disc degeneration, lumbar region: Secondary | ICD-10-CM | POA: Diagnosis not present

## 2020-03-31 DIAGNOSIS — N184 Chronic kidney disease, stage 4 (severe): Secondary | ICD-10-CM | POA: Diagnosis not present

## 2020-03-31 DIAGNOSIS — I5042 Chronic combined systolic (congestive) and diastolic (congestive) heart failure: Secondary | ICD-10-CM | POA: Diagnosis not present

## 2020-03-31 DIAGNOSIS — S88111A Complete traumatic amputation at level between knee and ankle, right lower leg, initial encounter: Secondary | ICD-10-CM | POA: Diagnosis not present

## 2020-03-31 DIAGNOSIS — I679 Cerebrovascular disease, unspecified: Secondary | ICD-10-CM | POA: Diagnosis not present

## 2020-03-31 DIAGNOSIS — R600 Localized edema: Secondary | ICD-10-CM | POA: Diagnosis not present

## 2020-04-03 DEATH — deceased
